# Patient Record
Sex: Female | Born: 1960 | Race: Black or African American | Hispanic: No | Marital: Single | Smoking: Current some day smoker
Health system: Southern US, Community
[De-identification: ages and names within clinical notes are randomized; demographics above are authoritative.]

## PROBLEM LIST (undated history)

## (undated) DIAGNOSIS — N186 End stage renal disease: Secondary | ICD-10-CM

## (undated) DIAGNOSIS — F329 Major depressive disorder, single episode, unspecified: Secondary | ICD-10-CM

## (undated) DIAGNOSIS — Z992 Dependence on renal dialysis: Secondary | ICD-10-CM

## (undated) DIAGNOSIS — I10 Essential (primary) hypertension: Secondary | ICD-10-CM

## (undated) DIAGNOSIS — M549 Dorsalgia, unspecified: Secondary | ICD-10-CM

## (undated) DIAGNOSIS — K732 Chronic active hepatitis, not elsewhere classified: Secondary | ICD-10-CM

## (undated) DIAGNOSIS — G8929 Other chronic pain: Secondary | ICD-10-CM

## (undated) DIAGNOSIS — K219 Gastro-esophageal reflux disease without esophagitis: Secondary | ICD-10-CM

## (undated) DIAGNOSIS — G20A1 Parkinson's disease without dyskinesia, without mention of fluctuations: Secondary | ICD-10-CM

## (undated) DIAGNOSIS — R002 Palpitations: Secondary | ICD-10-CM

## (undated) DIAGNOSIS — F32A Depression, unspecified: Secondary | ICD-10-CM

## (undated) DIAGNOSIS — R7989 Other specified abnormal findings of blood chemistry: Secondary | ICD-10-CM

## (undated) DIAGNOSIS — N179 Acute kidney failure, unspecified: Secondary | ICD-10-CM

## (undated) DIAGNOSIS — R945 Abnormal results of liver function studies: Secondary | ICD-10-CM

## (undated) DIAGNOSIS — F419 Anxiety disorder, unspecified: Secondary | ICD-10-CM

## (undated) DIAGNOSIS — G20 Parkinson's disease: Secondary | ICD-10-CM

## (undated) DIAGNOSIS — R011 Cardiac murmur, unspecified: Secondary | ICD-10-CM

## (undated) HISTORY — PX: TUBAL LIGATION: SHX77

## (undated) HISTORY — PX: TONSILLECTOMY: SUR1361

## (undated) HISTORY — PX: CARPAL TUNNEL RELEASE: SHX101

## (undated) HISTORY — DX: Acute kidney failure, unspecified: N17.9

## (undated) HISTORY — DX: Chronic active hepatitis, not elsewhere classified: K73.2

## (undated) HISTORY — DX: Other specified abnormal findings of blood chemistry: R79.89

## (undated) HISTORY — DX: Abnormal results of liver function studies: R94.5

---

## 1999-03-09 ENCOUNTER — Other Ambulatory Visit: Admission: RE | Admit: 1999-03-09 | Discharge: 1999-03-09 | Payer: Self-pay | Admitting: Obstetrics

## 1999-05-10 ENCOUNTER — Emergency Department (HOSPITAL_COMMUNITY): Admission: EM | Admit: 1999-05-10 | Discharge: 1999-05-10 | Payer: Self-pay | Admitting: Emergency Medicine

## 1999-06-29 ENCOUNTER — Emergency Department (HOSPITAL_COMMUNITY): Admission: EM | Admit: 1999-06-29 | Discharge: 1999-06-29 | Payer: Self-pay | Admitting: Emergency Medicine

## 1999-08-03 ENCOUNTER — Emergency Department (HOSPITAL_COMMUNITY): Admission: EM | Admit: 1999-08-03 | Discharge: 1999-08-03 | Payer: Self-pay | Admitting: Emergency Medicine

## 2002-03-30 ENCOUNTER — Inpatient Hospital Stay (HOSPITAL_COMMUNITY): Admission: AD | Admit: 2002-03-30 | Discharge: 2002-03-30 | Payer: Self-pay | Admitting: Obstetrics

## 2002-04-01 ENCOUNTER — Encounter: Payer: Self-pay | Admitting: Obstetrics

## 2002-04-01 ENCOUNTER — Ambulatory Visit (HOSPITAL_COMMUNITY): Admission: RE | Admit: 2002-04-01 | Discharge: 2002-04-01 | Payer: Self-pay | Admitting: Obstetrics

## 2002-12-18 ENCOUNTER — Emergency Department (HOSPITAL_COMMUNITY): Admission: EM | Admit: 2002-12-18 | Discharge: 2002-12-18 | Payer: Self-pay | Admitting: Emergency Medicine

## 2003-01-24 ENCOUNTER — Ambulatory Visit (HOSPITAL_BASED_OUTPATIENT_CLINIC_OR_DEPARTMENT_OTHER): Admission: RE | Admit: 2003-01-24 | Discharge: 2003-01-24 | Payer: Self-pay | Admitting: Cardiology

## 2005-01-10 ENCOUNTER — Emergency Department (HOSPITAL_COMMUNITY): Admission: EM | Admit: 2005-01-10 | Discharge: 2005-01-10 | Payer: Self-pay | Admitting: Emergency Medicine

## 2005-04-04 ENCOUNTER — Ambulatory Visit (HOSPITAL_COMMUNITY): Admission: RE | Admit: 2005-04-04 | Discharge: 2005-04-04 | Payer: Self-pay | Admitting: Obstetrics

## 2005-07-20 ENCOUNTER — Ambulatory Visit (HOSPITAL_BASED_OUTPATIENT_CLINIC_OR_DEPARTMENT_OTHER): Admission: RE | Admit: 2005-07-20 | Discharge: 2005-07-20 | Payer: Self-pay | Admitting: Otolaryngology

## 2005-07-20 ENCOUNTER — Encounter (INDEPENDENT_AMBULATORY_CARE_PROVIDER_SITE_OTHER): Payer: Self-pay | Admitting: Specialist

## 2005-07-20 ENCOUNTER — Ambulatory Visit (HOSPITAL_COMMUNITY): Admission: RE | Admit: 2005-07-20 | Discharge: 2005-07-20 | Payer: Self-pay | Admitting: Otolaryngology

## 2006-10-31 ENCOUNTER — Encounter: Admission: RE | Admit: 2006-10-31 | Discharge: 2006-10-31 | Payer: Self-pay | Admitting: Internal Medicine

## 2007-07-31 ENCOUNTER — Encounter: Admission: RE | Admit: 2007-07-31 | Discharge: 2007-08-20 | Payer: Self-pay | Admitting: Orthopedic Surgery

## 2007-10-15 ENCOUNTER — Ambulatory Visit (HOSPITAL_BASED_OUTPATIENT_CLINIC_OR_DEPARTMENT_OTHER): Admission: RE | Admit: 2007-10-15 | Discharge: 2007-10-15 | Payer: Self-pay | Admitting: Orthopedic Surgery

## 2008-02-23 ENCOUNTER — Emergency Department (HOSPITAL_COMMUNITY): Admission: EM | Admit: 2008-02-23 | Discharge: 2008-02-24 | Payer: Self-pay | Admitting: Emergency Medicine

## 2008-07-01 ENCOUNTER — Encounter: Admission: RE | Admit: 2008-07-01 | Discharge: 2008-07-01 | Payer: Self-pay | Admitting: Orthopedic Surgery

## 2009-01-04 ENCOUNTER — Emergency Department (HOSPITAL_COMMUNITY): Admission: EM | Admit: 2009-01-04 | Discharge: 2009-01-04 | Payer: Self-pay | Admitting: Emergency Medicine

## 2009-01-23 ENCOUNTER — Emergency Department (HOSPITAL_COMMUNITY): Admission: EM | Admit: 2009-01-23 | Discharge: 2009-01-23 | Payer: Self-pay | Admitting: Emergency Medicine

## 2009-02-25 ENCOUNTER — Emergency Department (HOSPITAL_COMMUNITY): Admission: EM | Admit: 2009-02-25 | Discharge: 2009-02-25 | Payer: Self-pay | Admitting: Emergency Medicine

## 2009-04-10 ENCOUNTER — Emergency Department (HOSPITAL_COMMUNITY): Admission: EM | Admit: 2009-04-10 | Discharge: 2009-04-10 | Payer: Self-pay | Admitting: Emergency Medicine

## 2009-07-22 ENCOUNTER — Emergency Department (HOSPITAL_COMMUNITY): Admission: EM | Admit: 2009-07-22 | Discharge: 2009-07-22 | Payer: Self-pay | Admitting: Emergency Medicine

## 2009-10-26 ENCOUNTER — Emergency Department (HOSPITAL_COMMUNITY): Admission: EM | Admit: 2009-10-26 | Discharge: 2009-10-27 | Payer: Self-pay | Admitting: Emergency Medicine

## 2010-09-09 ENCOUNTER — Encounter: Payer: Self-pay | Admitting: Internal Medicine

## 2010-09-10 ENCOUNTER — Encounter: Payer: Self-pay | Admitting: Obstetrics

## 2010-09-10 ENCOUNTER — Encounter: Payer: Self-pay | Admitting: Internal Medicine

## 2010-09-10 ENCOUNTER — Encounter: Payer: Self-pay | Admitting: Orthopedic Surgery

## 2010-09-11 ENCOUNTER — Encounter: Payer: Self-pay | Admitting: Internal Medicine

## 2010-09-26 ENCOUNTER — Emergency Department (HOSPITAL_COMMUNITY)
Admission: EM | Admit: 2010-09-26 | Discharge: 2010-09-26 | Disposition: A | Discharge status: Home / Self Care | Payer: Medicaid Other | Attending: Emergency Medicine | Admitting: Emergency Medicine

## 2010-09-26 DIAGNOSIS — I10 Essential (primary) hypertension: Secondary | ICD-10-CM | POA: Insufficient documentation

## 2010-09-26 DIAGNOSIS — E119 Type 2 diabetes mellitus without complications: Secondary | ICD-10-CM | POA: Insufficient documentation

## 2010-09-26 DIAGNOSIS — K089 Disorder of teeth and supporting structures, unspecified: Secondary | ICD-10-CM | POA: Insufficient documentation

## 2010-09-26 DIAGNOSIS — F3130 Bipolar disorder, current episode depressed, mild or moderate severity, unspecified: Secondary | ICD-10-CM | POA: Insufficient documentation

## 2010-09-26 DIAGNOSIS — Z79899 Other long term (current) drug therapy: Secondary | ICD-10-CM | POA: Insufficient documentation

## 2010-11-12 LAB — POCT I-STAT, CHEM 8
BUN: 6 mg/dL (ref 6–23)
Calcium, Ion: 1.13 mmol/L (ref 1.12–1.32)
Glucose, Bld: 133 mg/dL — ABNORMAL HIGH (ref 70–99)
TCO2: 24 mmol/L (ref 0–100)

## 2010-11-12 LAB — RAPID URINE DRUG SCREEN, HOSP PERFORMED
Amphetamines: NOT DETECTED
Cocaine: POSITIVE — AB
Opiates: NOT DETECTED
Tetrahydrocannabinol: NOT DETECTED

## 2010-11-12 LAB — ETHANOL: Alcohol, Ethyl (B): 202 mg/dL — ABNORMAL HIGH (ref 0–10)

## 2010-11-25 LAB — CBC
Hemoglobin: 12.3 g/dL (ref 12.0–15.0)
MCV: 85.4 fL (ref 78.0–100.0)
Platelets: 266 10*3/uL (ref 150–400)
RBC: 4.43 MIL/uL (ref 3.87–5.11)
RDW: 18.4 % — ABNORMAL HIGH (ref 11.5–15.5)

## 2010-11-25 LAB — URINALYSIS, ROUTINE W REFLEX MICROSCOPIC
Bilirubin Urine: NEGATIVE
Glucose, UA: >1000 mg/dL — AB
Ketones, ur: NEGATIVE mg/dL
Nitrite: NEGATIVE
Urobilinogen, UA: 1 mg/dL (ref 0.0–1.0)

## 2010-11-25 LAB — DIFFERENTIAL
Basophils Absolute: 0.2 10*3/uL — ABNORMAL HIGH (ref 0.0–0.1)
Basophils Relative: 2 % — ABNORMAL HIGH (ref 0–1)
Eosinophils Absolute: 0.2 10*3/uL (ref 0.0–0.7)
Lymphocytes Relative: 23 % (ref 12–46)
Lymphs Abs: 1.8 10*3/uL (ref 0.7–4.0)
Monocytes Absolute: 0.6 10*3/uL (ref 0.1–1.0)
Neutro Abs: 5.2 10*3/uL (ref 1.7–7.7)

## 2010-11-25 LAB — GLUCOSE, CAPILLARY: Glucose-Capillary: 447 mg/dL — ABNORMAL HIGH (ref 70–99)

## 2010-11-25 LAB — COMPREHENSIVE METABOLIC PANEL
ALT: 88 U/L — ABNORMAL HIGH (ref 0–35)
AST: 451 U/L — ABNORMAL HIGH (ref 0–37)
Albumin: 3.3 g/dL — ABNORMAL LOW (ref 3.5–5.2)
Alkaline Phosphatase: 234 U/L — ABNORMAL HIGH (ref 39–117)
CO2: 23 mEq/L (ref 19–32)
Calcium: 9.4 mg/dL (ref 8.4–10.5)
Chloride: 100 mEq/L (ref 96–112)
GFR calc Af Amer: >60 mL/min (ref >60)
GFR calc non Af Amer: >60 mL/min (ref >60)
Glucose, Bld: 358 mg/dL — ABNORMAL HIGH (ref 70–99)
Total Protein: 8.5 g/dL — ABNORMAL HIGH (ref 6.0–8.3)

## 2010-11-25 LAB — URINE MICROSCOPIC-ADD ON

## 2010-11-26 LAB — URINE CULTURE: Colony Count: >=100000

## 2010-11-26 LAB — POCT I-STAT, CHEM 8
BUN: 6 mg/dL (ref 6–23)
Creatinine, Ser: 0.8 mg/dL (ref 0.4–1.2)
Hemoglobin: 12.9 g/dL (ref 12.0–15.0)
Potassium: 3.5 mEq/L (ref 3.5–5.1)
Sodium: 137 mEq/L (ref 135–145)
TCO2: 27 mmol/L (ref 0–100)

## 2010-11-26 LAB — URINALYSIS, ROUTINE W REFLEX MICROSCOPIC
Glucose, UA: NEGATIVE mg/dL
Ketones, ur: NEGATIVE mg/dL
Nitrite: POSITIVE — AB
Specific Gravity, Urine: 1.012 (ref 1.005–1.030)
pH: 6 (ref 5.0–8.0)

## 2010-11-26 LAB — URINE MICROSCOPIC-ADD ON

## 2010-11-26 LAB — PREGNANCY, URINE: Preg Test, Ur: NEGATIVE

## 2010-11-26 LAB — RAPID URINE DRUG SCREEN, HOSP PERFORMED
Cocaine: NOT DETECTED
Tetrahydrocannabinol: NOT DETECTED

## 2010-11-28 LAB — GLUCOSE, CAPILLARY: Glucose-Capillary: 73 mg/dL (ref 70–99)

## 2011-01-02 NOTE — Op Note (Signed)
NAMESOPHEAP, RODIO NO.:  [id number]   MEDICAL RECORD NO.:  [id number]          PATIENT TYPE:  AMB   LOCATION:  [place]                          FACILITY:  [facility]   PHYSICIAN:  Sheral Apley. 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   POSTOPERATIVE DIAGNOSIS:  Chronic right thumb stenosing tenosynovitis  and right cubital tunnel syndrome.   PROCEDURE:  Right cubital tunnel release and right thumb A1 pulley  release.   SURGEON:  Sheral Apley. Burney Gauze, M.D.   ASSISTANT:  None.   ANESTHESIA:  General.   TOURNIQUET TIME:  31 minutes.   COMPLICATIONS:  None.   DRAINS:  None.   OPERATIVE REPORT:  The patient was taken to the operating suite. After  the induction of adequate general anesthesia, the right upper extremity  was prepped and draped in a sterile fashion.  Esmarch was used to  exsanguinate the limb and the tourniquet inflated to 250 mmHg.  At this  point in time, an incision was made on the medial aspect of the right  elbow centered between the olecranon process and medial epicondyle.  The  skin was incised.  Branches of the medial antebrachial cutaneous nerve  were identified and retracted. The cubital tunnel was identified,  unroofed.  The ulnar nerve was decompressed down to the level of the  flexor carpi ulnaris muscle.  The fascia overlying this was split and  the nerve was traced deep into the muscle bellies and all pressure  points were relieved. Proximally, it was released under a skin bridge  for 6-8 cm including release of the inner muscular septum. All pressure  points were relieved.  The wound was then thoroughly irrigated and  loosely closed in layers of 2-0 Vicryl and 4-0 Vicryl Rapide  subcuticular stitch.  A second incision was made over  the MP flexion  crease right thumb.  The skin was incised.  Neurovascular bundle was  identified and retracted.  The A1 pulley was split.  The FPL tendon was  lysed of all adhesions.  It was irrigated and loosely closed with 4-0  Vicryl Rapide.  Steri-Strips, 4x4s, fluffs, and compressive dressings  were applied at both the elbow and the thumb.  The patient tolerated  both procedures well and went to the recovery room in stable fashion.     Sheral Apley Burney Gauze, M.D.  Electronically Signed    MAW/MEDQ  D:  10/15/2007  T:  10/15/2007  Job:  [id number]

## 2011-01-05 NOTE — Op Note (Signed)
[name], GUHL NO.:  [id number]   MEDICAL RECORD NO.:  [id number]          PATIENT TYPE:  AMB   LOCATION:  [place]                          FACILITY:  [facility]   PHYSICIAN:  Leonides Sake. 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   OPERATION:  Microlaryngoscopy with excision of right anterior vocal cord  lesion.   SURGEON:  Leonides Sake. Lucia Gaskins, M.D.   ANESTHESIA:  General endotracheal.   COMPLICATIONS:  None.   BRIEF CLINICAL NOTE:  Carol [name] is a 50 year old female who has had  hoarseness now for several months.  She has had a previous history of  microlaryngoscopy and laser excision of vocal cord lesion by Dr. Enid Skeens  in 1992.  She does smoke one-half to one pack a day.  Also has a history of  acid reflux, for which she takes Aciphex.  On examination in the office on  indirect laryngoscopy, she has a right anterior true vocal cord lesion,  questionable polyp or papilloma.  She is taken to the operating room at this  time for direct laryngoscopy and excision of right vocal cord lesion.   DESCRIPTION OF PROCEDURE:  After adequate endotracheal anesthesia, direct  laryngoscopy was performed.  The base of tongue and epiglottis were normal.  Both piriform sinuses were clear.  AE folds were normal.  The vocal cords  were then evaluated and the laryngoscope was suspended.  The patient had a  large exudative polypoid or papillomatous lesion off the anterior right true  vocal cord.  Had a reactive small nodule on the left anterior true vocal  cord.  The large papillomatous lesion was excised at its base from the vocal  cord and sent to pathology.  Hemostasis was obtained with cotton  pledget  soaked in epinephrine.  This completed the procedure.  Eleonore was awoken  from anesthesia and transferred to the recovery room postop doing well.   DISPOSITION:  [name] is discharged home later this morning on Tylenol and  Tylenol No. 3 p.r.n. pain.  Will continue her regular medications.  Also  will give her a prescription for a Nicoderm patch to use to help her to try  to stop smoking.           ______________________________  Leonides Sake. Lucia Gaskins, M.D.     CEN/MEDQ  D:  07/20/2005  T:  07/20/2005  Job:  [id number]   cc:   Nolene Ebbs, M.D.  Fax: [phone]

## 2011-05-11 LAB — BASIC METABOLIC PANEL
BUN: 8
CO2: 24
Calcium: 9
Chloride: 107
Creatinine, Ser: 0.73
GFR calc Af Amer: >60
GFR calc non Af Amer: >60
Glucose, Bld: 154 — ABNORMAL HIGH
Potassium: 4.2
Sodium: 136

## 2011-05-11 LAB — POCT HEMOGLOBIN-HEMACUE: Hemoglobin: 12.1

## 2011-05-17 LAB — BASIC METABOLIC PANEL
BUN: 5 — ABNORMAL LOW
CO2: 23
Chloride: 104
Glucose, Bld: 92
Potassium: 3.2 — ABNORMAL LOW

## 2011-05-17 LAB — DIFFERENTIAL
Lymphocytes Relative: 45
Lymphs Abs: 2.7
Monocytes Absolute: 0.9
Monocytes Relative: 15 — ABNORMAL HIGH
Neutro Abs: 2.1

## 2011-05-17 LAB — CBC
HCT: 39.4
Hemoglobin: 13.1
RBC: 4.42

## 2011-05-17 LAB — CK TOTAL AND CKMB (NOT AT ARMC): Total CK: 123

## 2011-05-17 LAB — TROPONIN I: Troponin I: <0.01

## 2011-05-27 ENCOUNTER — Emergency Department (HOSPITAL_COMMUNITY)
Admission: EM | Admit: 2011-05-27 | Discharge: 2011-05-27 | Disposition: A | Discharge status: Home / Self Care | Payer: Medicare Other | Attending: Emergency Medicine | Admitting: Emergency Medicine

## 2011-05-27 ENCOUNTER — Emergency Department (HOSPITAL_COMMUNITY): Payer: Medicare Other

## 2011-05-27 DIAGNOSIS — W1809XA Striking against other object with subsequent fall, initial encounter: Secondary | ICD-10-CM | POA: Insufficient documentation

## 2011-05-27 DIAGNOSIS — S2249XA Multiple fractures of ribs, unspecified side, initial encounter for closed fracture: Secondary | ICD-10-CM | POA: Insufficient documentation

## 2011-05-27 DIAGNOSIS — Z794 Long term (current) use of insulin: Secondary | ICD-10-CM | POA: Insufficient documentation

## 2011-05-27 DIAGNOSIS — E119 Type 2 diabetes mellitus without complications: Secondary | ICD-10-CM | POA: Insufficient documentation

## 2011-05-27 DIAGNOSIS — F319 Bipolar disorder, unspecified: Secondary | ICD-10-CM | POA: Insufficient documentation

## 2011-05-27 DIAGNOSIS — R071 Chest pain on breathing: Secondary | ICD-10-CM | POA: Insufficient documentation

## 2011-05-27 DIAGNOSIS — I10 Essential (primary) hypertension: Secondary | ICD-10-CM | POA: Insufficient documentation

## 2011-06-17 ENCOUNTER — Emergency Department (HOSPITAL_COMMUNITY)
Admission: EM | Admit: 2011-06-17 | Discharge: 2011-06-17 | Disposition: A | Discharge status: Home / Self Care | Payer: Medicare Other | Attending: Emergency Medicine | Admitting: Emergency Medicine

## 2011-06-17 DIAGNOSIS — R21 Rash and other nonspecific skin eruption: Secondary | ICD-10-CM | POA: Insufficient documentation

## 2011-06-17 DIAGNOSIS — I10 Essential (primary) hypertension: Secondary | ICD-10-CM | POA: Insufficient documentation

## 2011-06-17 DIAGNOSIS — E119 Type 2 diabetes mellitus without complications: Secondary | ICD-10-CM | POA: Insufficient documentation

## 2011-07-03 ENCOUNTER — Other Ambulatory Visit: Payer: Self-pay

## 2011-07-03 ENCOUNTER — Encounter: Payer: Self-pay | Admitting: Cardiology

## 2011-07-03 ENCOUNTER — Emergency Department (HOSPITAL_COMMUNITY)
Admission: EM | Admit: 2011-07-03 | Discharge: 2011-07-03 | Disposition: A | Discharge status: Home / Self Care | Payer: Medicare Other | Attending: Emergency Medicine | Admitting: Emergency Medicine

## 2011-07-03 ENCOUNTER — Emergency Department (HOSPITAL_COMMUNITY): Payer: Medicare Other

## 2011-07-03 DIAGNOSIS — E119 Type 2 diabetes mellitus without complications: Secondary | ICD-10-CM | POA: Insufficient documentation

## 2011-07-03 DIAGNOSIS — Z794 Long term (current) use of insulin: Secondary | ICD-10-CM | POA: Insufficient documentation

## 2011-07-03 DIAGNOSIS — N390 Urinary tract infection, site not specified: Secondary | ICD-10-CM | POA: Insufficient documentation

## 2011-07-03 DIAGNOSIS — I10 Essential (primary) hypertension: Secondary | ICD-10-CM | POA: Insufficient documentation

## 2011-07-03 DIAGNOSIS — R002 Palpitations: Secondary | ICD-10-CM | POA: Insufficient documentation

## 2011-07-03 HISTORY — DX: Other chronic pain: G89.29

## 2011-07-03 HISTORY — DX: Depression, unspecified: F32.A

## 2011-07-03 HISTORY — DX: Dorsalgia, unspecified: M54.9

## 2011-07-03 HISTORY — DX: Major depressive disorder, single episode, unspecified: F32.9

## 2011-07-03 HISTORY — DX: Essential (primary) hypertension: I10

## 2011-07-03 HISTORY — DX: Palpitations: R00.2

## 2011-07-03 LAB — URINALYSIS, ROUTINE W REFLEX MICROSCOPIC
Glucose, UA: NEGATIVE mg/dL
Specific Gravity, Urine: 1.028 (ref 1.005–1.030)
Urobilinogen, UA: 1 mg/dL (ref 0.0–1.0)

## 2011-07-03 LAB — CBC
MCH: 30.9 pg (ref 26.0–34.0)
MCHC: 35.3 g/dL (ref 30.0–36.0)
Platelets: 223 10*3/uL (ref 150–400)
RBC: 4.66 MIL/uL (ref 3.87–5.11)

## 2011-07-03 LAB — DIFFERENTIAL
Basophils Relative: 0 % (ref 0–1)
Eosinophils Absolute: 0.1 10*3/uL (ref 0.0–0.7)
Neutrophils Relative %: 45 % (ref 43–77)

## 2011-07-03 LAB — URINE MICROSCOPIC-ADD ON

## 2011-07-03 LAB — BASIC METABOLIC PANEL
BUN: 14 mg/dL (ref 6–23)
Calcium: 9.9 mg/dL (ref 8.4–10.5)
GFR calc non Af Amer: 74 mL/min — ABNORMAL LOW (ref >90)
Glucose, Bld: 147 mg/dL — ABNORMAL HIGH (ref 70–99)
Sodium: 139 mEq/L (ref 135–145)

## 2011-07-03 LAB — PREGNANCY, URINE: Preg Test, Ur: NEGATIVE

## 2011-07-03 LAB — D-DIMER, QUANTITATIVE: D-Dimer, Quant: 0.32 ug/mL-FEU (ref 0.00–0.48)

## 2011-07-03 LAB — POCT I-STAT TROPONIN I: Troponin i, poc: 0 ng/mL (ref 0.00–0.08)

## 2011-07-03 MED ORDER — CEPHALEXIN 250 MG PO CAPS
500.0000 mg | ORAL_CAPSULE | Freq: Once | ORAL | Status: AC
  Administered 2011-07-03: 500 mg via ORAL
  Filled 2011-07-03: qty 2

## 2011-07-03 MED ORDER — CEPHALEXIN 500 MG PO CAPS: 500.0000 mg | ORAL_CAPSULE | Freq: Four times a day (QID) | ORAL | Status: AC

## 2011-07-03 NOTE — ED Provider Notes (Signed)
History     CSN: [id number] Arrival date & time: 07/03/2011  7:13 PM    Chief Complaint  Patient presents with  . Palpitations    HPI Pt was seen at [facility].  Per pt, c/o gradual onset and improvement in persistent palpitations that began PTA.  Pt states she had just finished taking her usual evening meds and was sitting watching TV when her symptoms began.  States she has had similar symptoms previously and "that's why I take I medicines."  Saw her PMD last week and told she was "ok."  Denies any change in her usual symptoms, no new symptoms, no new meds/changes, no CP/SOB, no cough, no fevers, no abd pain, no back pain, no recent long travel, no estrogen products, no calf/LE pain or unilateral swelling.   Past Medical History  Diagnosis Date  . Hypertension   . Diabetes mellitus   . Depression   . Chronic back pain   . Palpitations     Past Surgical History  Procedure Date  . Carpal tunnel release      History  Substance Use Topics  . Smoking status: Current Everyday Smoker  . Smokeless tobacco: Not on file  . Alcohol Use: Yes     occasional    Review of Systems ROS: Statement: All systems negative except as marked or noted in the HPI; Constitutional: Negative for fever and chills. ; ; Eyes: Negative for eye pain, redness and discharge. ; ; ENMT: Negative for ear pain, hoarseness, nasal congestion, sinus pressure and sore throat. ; ; Cardiovascular: +palpitations. Negative for chest pain, diaphoresis, dyspnea and peripheral edema. ; ; Respiratory: Negative for cough, wheezing and stridor. ; ; Gastrointestinal: Negative for nausea, vomiting, diarrhea and abdominal pain, blood in stool, hematemesis, jaundice and rectal bleeding. . ; ; Genitourinary: Negative for dysuria, flank pain and hematuria. ; ; Musculoskeletal: Negative for back pain and neck pain. Negative for swelling and trauma.; ; Skin: Negative for pruritus, rash, abrasions, blisters, bruising and skin lesion.; ; Neuro:  Negative for headache, lightheadedness and neck stiffness. Negative for weakness, altered level of consciousness , altered mental status, extremity weakness, paresthesias, involuntary movement, seizure and syncope.  Psych:  No SI, no SA, no HI, no hallucinations.  Allergies  Review of patient's allergies indicates no known allergies.  Home Medications   Current Outpatient Rx  Name Route Sig Dispense Refill  . CARVEDILOL 3.125 MG PO TABS Oral Take 3.125 mg by mouth daily.      Marland Kitchen DIPHENHYDRAMINE HCL (SLEEP) 25 MG PO TABS Oral Take 25 mg by mouth at bedtime as needed. For sleep     . GLIMEPIRIDE 4 MG PO TABS Oral Take 4 mg by mouth 2 (two) times daily.      . INSULIN ASPART 100 UNIT/ML [place] SOLN Subcutaneous Inject 2-15 Units into the skin 2 (two) times daily. Pt uses home sliding scale (>200 = 4 units)     . INSULIN GLARGINE 100 UNIT/ML [place] SOLN Subcutaneous Inject 30 Units into the skin every morning.      Marland Kitchen METFORMIN HCL 500 MG PO TABS Oral Take 500 mg by mouth 2 (two) times daily with a meal.      . OLMESARTAN MEDOXOMIL-HCTZ 40-12.5 MG PO TABS Oral Take 1 tablet by mouth daily.      . QUETIAPINE FUMARATE 150 MG PO TB24 Oral Take 150 mg by mouth at bedtime.      . CEPHALEXIN 500 MG PO CAPS Oral Take 1 capsule (500  mg total) by mouth 4 (four) times daily. 40 capsule 0    BP 107/65  Pulse 82  Resp 19  SpO2 100%  Physical Exam 2000: Physical examination:  Nursing notes reviewed; Vital signs and O2 SAT reviewed;  Constitutional: Well developed, Well nourished, Well hydrated, In no acute distress; Head:  Normocephalic, atraumatic; Eyes: EOMI, PERRL, No scleral icterus; ENMT: Mouth and pharynx normal, Mucous membranes moist; Neck: Supple, Full range of motion, No lymphadenopathy; Cardiovascular: Mild tachycardia, HR 100 during my exam, No murmur, rub, or gallop; Respiratory: Breath sounds clear & equal bilaterally, No rales, rhonchi, wheezes, or rub, Normal respiratory effort/excursion; Chest:  Nontender, Movement normal; Abdomen: Soft, Nontender, Nondistended, Normal bowel sounds; Extremities: Pulses normal, No tenderness, No edema, No calf edema or asymmetry.; Neuro: AA&Ox3, Major CN grossly intact.  No gross focal motor or sensory deficits in extremities.; Skin: Color normal, Warm, Dry, Psych:  Affect flat.    ED Course  Procedures   MDM  MDM Reviewed: nursing note, vitals and previous chart Reviewed previous: ECG Interpretation: ECG, x-ray and labs    Date: 07/03/2011  Rate: 115  Rhythm: sinus tachycardia  QRS Axis: normal  Intervals: QT prolonged  ST/T Wave abnormalities: normal  Conduction Disutrbances:none  Narrative Interpretation:   Old EKG Reviewed: unchanged, no significant changes from previous EKG dated 02/23/2008.   Results for orders placed during the hospital encounter of [id number]  BASIC METABOLIC PANEL      Component Value Range   Sodium 139  135 - 145 (mEq/L)   Potassium 3.6  3.5 - 5.1 (mEq/L)   Chloride 100  96 - 112 (mEq/L)   CO2 25  19 - 32 (mEq/L)   Glucose, Bld 147 (*) 70 - 99 (mg/dL)   BUN 14  6 - 23 (mg/dL)   Creatinine, Ser 0.90  0.50 - 1.10 (mg/dL)   Calcium 9.9  8.4 - 10.5 (mg/dL)   GFR calc non Af Amer 74 (*) >90 (mL/min)   GFR calc Af Amer 86 (*) >90 (mL/min)  CBC      Component Value Range   WBC 6.6  4.0 - 10.5 (K/uL)   RBC 4.66  3.87 - 5.11 (MIL/uL)   Hemoglobin 14.4  12.0 - 15.0 (g/dL)   HCT 40.8  36.0 - 46.0 (%)   MCV 87.6  78.0 - 100.0 (fL)   MCH 30.9  26.0 - 34.0 (pg)   MCHC 35.3  30.0 - 36.0 (g/dL)   RDW 13.3  11.5 - 15.5 (%)   Platelets 223  150 - 400 (K/uL)  DIFFERENTIAL      Component Value Range   Neutrophils Relative 45  43 - 77 (%)   Neutro Abs 3.0  1.7 - 7.7 (K/uL)   Lymphocytes Relative 41  12 - 46 (%)   Lymphs Abs 2.7  0.7 - 4.0 (K/uL)   Monocytes Relative 12  3 - 12 (%)   Monocytes Absolute 0.8  0.1 - 1.0 (K/uL)   Eosinophils Relative 2  0 - 5 (%)   Eosinophils Absolute 0.1  0.0 - 0.7 (K/uL)   Basophils  Relative 0  0 - 1 (%)   Basophils Absolute 0.0  0.0 - 0.1 (K/uL)  D-DIMER, QUANTITATIVE      Component Value Range   D-Dimer, Quant 0.32  0.00 - 0.48 (ug/mL-FEU)  URINALYSIS, ROUTINE W REFLEX MICROSCOPIC      Component Value Range   Color, Urine AMBER (*) YELLOW    Appearance CLOUDY (*) CLEAR  Specific Gravity, Urine 1.028  1.005 - 1.030    pH 6.0  5.0 - 8.0    Glucose, UA NEGATIVE  NEGATIVE (mg/dL)   Hgb urine dipstick LARGE (*) NEGATIVE    Bilirubin Urine SMALL (*) NEGATIVE    Ketones, ur 15 (*) NEGATIVE (mg/dL)   Protein, ur 30 (*) NEGATIVE (mg/dL)   Urobilinogen, UA 1.0  0.0 - 1.0 (mg/dL)   Nitrite NEGATIVE  NEGATIVE    Leukocytes, UA LARGE (*) NEGATIVE   PREGNANCY, URINE      Component Value Range   Preg Test, Ur NEGATIVE    POCT I-STAT TROPONIN I      Component Value Range   Troponin i, poc 0.00  0.00 - 0.08 (ng/mL)   Comment 3           URINE MICROSCOPIC-ADD ON      Component Value Range   Squamous Epithelial / LPF MANY (*) RARE    WBC, UA 21-50  <3 (WBC/hpf)   RBC / HPF 21-50  <3 (RBC/hpf)   Bacteria, UA FEW (*) RARE    Urine-Other MUCOUS PRESENT     Dg Chest 2 View  07/03/2011  *RADIOLOGY REPORT*  Clinical Data: 50 year old female with tachycardia and cough.  CHEST - 2 VIEW  Comparison: 05/27/2011  Findings: The cardiomediastinal silhouette is unremarkable. The lungs are clear. There is no evidence of focal airspace disease, pulmonary edema, pulmonary nodule/mass, pleural effusion, or pneumothorax. No acute bony abnormalities are identified.  IMPRESSION: No evidence of active cardiopulmonary disease.  Original Report Authenticated By: Lura Em, M.D.   11:35 AM:  +UTI, UC pending.  Will give 1st dose abx in ED.  VSS since arrival to ED, HR 80's.  Feels "better," has tol PO well, and tells multiple ED staff she wants to go home now.  Dx testing d/w pt and family.  Questions answered.  Verb understanding, agreeable to d/c home with outpt f/u.   Medications given  in ED:  cephALEXin (KEFLEX) capsule 500 mg (500 mg Oral Given 07/03/11 2348)    New Prescriptions   CEPHALEXIN (KEFLEX) 500 MG CAPSULE    Take 1 capsule (500 mg total) by mouth 4 (four) times daily.      [place], DO 07/04/11 1138

## 2011-07-03 NOTE — ED Notes (Signed)
Pt given snack and drink; updated of POC

## 2011-07-03 NOTE — ED Notes (Signed)
PT attempting to provide urine sample 

## 2011-07-03 NOTE — ED Notes (Signed)
Family at bedside. 

## 2011-07-03 NOTE — ED Notes (Signed)
Palpitations starting early evening; pt anxious; VSS.

## 2011-07-03 NOTE — ED Notes (Signed)
Patient is resting comfortably. 

## 2011-07-04 ENCOUNTER — Encounter (HOSPITAL_COMMUNITY): Payer: Self-pay | Admitting: Emergency Medicine

## 2011-07-05 LAB — URINE CULTURE

## 2011-07-08 NOTE — ED Notes (Signed)
Pt (+) URNC-> Group B Strep.  Given Rx for Keflex -> No sensitivities provided.  Chart reviewed by Verl Dicker PA "Culture showed Group B Strep, covered by Keflex"  Chart appended per protocol.

## 2011-09-22 ENCOUNTER — Emergency Department (HOSPITAL_COMMUNITY): Payer: Medicare Other

## 2011-09-22 ENCOUNTER — Emergency Department (HOSPITAL_COMMUNITY)
Admission: EM | Admit: 2011-09-22 | Discharge: 2011-09-22 | Disposition: A | Discharge status: Home / Self Care | Payer: Medicare Other | Attending: Emergency Medicine | Admitting: Emergency Medicine

## 2011-09-22 ENCOUNTER — Encounter (HOSPITAL_COMMUNITY): Payer: Self-pay | Admitting: *Deleted

## 2011-09-22 DIAGNOSIS — Y9229 Other specified public building as the place of occurrence of the external cause: Secondary | ICD-10-CM | POA: Insufficient documentation

## 2011-09-22 DIAGNOSIS — M7989 Other specified soft tissue disorders: Secondary | ICD-10-CM | POA: Insufficient documentation

## 2011-09-22 DIAGNOSIS — E119 Type 2 diabetes mellitus without complications: Secondary | ICD-10-CM | POA: Insufficient documentation

## 2011-09-22 DIAGNOSIS — S62309A Unspecified fracture of unspecified metacarpal bone, initial encounter for closed fracture: Secondary | ICD-10-CM | POA: Insufficient documentation

## 2011-09-22 DIAGNOSIS — W010XXA Fall on same level from slipping, tripping and stumbling without subsequent striking against object, initial encounter: Secondary | ICD-10-CM | POA: Insufficient documentation

## 2011-09-22 DIAGNOSIS — Z794 Long term (current) use of insulin: Secondary | ICD-10-CM | POA: Insufficient documentation

## 2011-09-22 DIAGNOSIS — I10 Essential (primary) hypertension: Secondary | ICD-10-CM | POA: Insufficient documentation

## 2011-09-22 DIAGNOSIS — IMO0002 Reserved for concepts with insufficient information to code with codable children: Secondary | ICD-10-CM | POA: Insufficient documentation

## 2011-09-22 LAB — POCT I-STAT, CHEM 8
BUN: 6 mg/dL (ref 6–23)
Calcium, Ion: 1.06 mmol/L — ABNORMAL LOW (ref 1.12–1.32)
Chloride: 109 mEq/L (ref 96–112)
Potassium: 3.1 mEq/L — ABNORMAL LOW (ref 3.5–5.1)

## 2011-09-22 LAB — GLUCOSE, CAPILLARY: Glucose-Capillary: 134 mg/dL — ABNORMAL HIGH (ref 70–99)

## 2011-09-22 MED ORDER — POTASSIUM CHLORIDE CRYS ER 20 MEQ PO TBCR
40.0000 meq | EXTENDED_RELEASE_TABLET | Freq: Once | ORAL | Status: AC
  Administered 2011-09-22: 40 meq via ORAL
  Filled 2011-09-22: qty 2

## 2011-09-22 MED ORDER — OXYCODONE-ACETAMINOPHEN 5-325 MG PO TABS: 2.0000 | ORAL_TABLET | ORAL | Status: AC | PRN

## 2011-09-22 MED ORDER — OXYCODONE-ACETAMINOPHEN 5-325 MG PO TABS
1.0000 | ORAL_TABLET | Freq: Once | ORAL | Status: AC
  Administered 2011-09-22: 1 via ORAL
  Filled 2011-09-22: qty 1

## 2011-09-22 NOTE — ED Notes (Signed)
Pt states she stumbled and fell yesterday, landing on her right hand.  Pt states the right wrist turned under when she fell with posterior right hand against the floor under her.  Pt has good pulses in RUE, but has non-pitting edema to right hand both posterior and anterior.  Pt denies pain radiating to wrist and no edema or deformity noted to wrist.  Pt hit her face when she fell as well and stated she she bruised her right upper lip, but denies pain there now.  Pt states she is uncertain as to whether or not she lost consciousness and states her son told her she fell when she complained of hand pain last night.

## 2011-09-22 NOTE — ED Provider Notes (Signed)
History     CSN: [id number]  Arrival date & time 09/22/11  1014   First MD Initiated Contact with Patient 09/22/11 1029      Chief Complaint  Patient presents with  . Hand Pain    right hand pain and swelling    (Consider location/radiation/quality/duration/timing/severity/associated sxs/prior treatment) HPI  51 year old female presenting to the ED complaining of right hand injury. Patient states she is a diabetic.  While walking at [employer] last night she felt lightheadedness and dizziness. Her son told her that she fell forward.  The patient then noticed pain to the right wrist and abrasion to her lips. Today she noticed increasing pain to right hand and wrist with swelling to. She denies significant pain to the face. She denies headache, lightheadedness, chest pain or shortness of breath, nausea, vomiting, diarrhea, abdominal pain, dysuria, rectal bleeding, numbness or weakness. Patient states she did not eat her meal last night prior to falling. She did not take her morning medication today.  Past Medical History  Diagnosis Date  . Hypertension   . Diabetes mellitus   . Depression   . Chronic back pain   . Palpitations     Past Surgical History  Procedure Date  . Carpal tunnel release     No family history on file.  History  Substance Use Topics  . Smoking status: Former Smoker -- 0.5 packs/day for 3 years    Types: Cigarettes    Quit date: 09/01/2011  . Smokeless tobacco: Not on file  . Alcohol Use: Yes     occasional    OB History    Grav Para Term Preterm Abortions TAB SAB Ect Mult Living                  Review of Systems  All other systems reviewed and are negative.    Allergies  Review of patient's allergies indicates no known allergies.  Home Medications   Current Outpatient Rx  Name Route Sig Dispense Refill  . CARVEDILOL 3.125 MG PO TABS Oral Take 3.125 mg by mouth daily.      Marland Kitchen DIPHENHYDRAMINE HCL (SLEEP) 25 MG PO TABS Oral Take 25 mg by  mouth at bedtime as needed. For sleep     . GLIMEPIRIDE 4 MG PO TABS Oral Take 4 mg by mouth 2 (two) times daily.      . INSULIN ASPART 100 UNIT/ML [place] SOLN Subcutaneous Inject 2-15 Units into the skin 2 (two) times daily. Pt uses home sliding scale (>200 = 4 units)     . INSULIN GLARGINE 100 UNIT/ML [place] SOLN Subcutaneous Inject 30 Units into the skin every morning.      Marland Kitchen METFORMIN HCL 500 MG PO TABS Oral Take 500 mg by mouth 2 (two) times daily with a meal.      . OLMESARTAN MEDOXOMIL-HCTZ 40-12.5 MG PO TABS Oral Take 1 tablet by mouth daily.      . QUETIAPINE FUMARATE ER 150 MG PO TB24 Oral Take 150 mg by mouth at bedtime.        BP 162/99  Pulse 70  Temp(Src) 98.5 F (36.9 C) (Oral)  Resp 18  SpO2 100%  Physical Exam  Nursing note and vitals reviewed. Constitutional: She appears well-developed and well-nourished.       Awake, alert, nontoxic appearance  HENT:  Head: Atraumatic.         No midface tenderness or point tenderness.  No septal hematoma, no hemotypanum  Eyes: Conjunctivae are normal.  Right eye exhibits no discharge. Left eye exhibits no discharge.  Neck: Normal range of motion. Neck supple.  Cardiovascular: Normal rate and regular rhythm.   Pulmonary/Chest: Effort normal. No respiratory distress. She exhibits no tenderness.  Abdominal: There is no tenderness. There is no rebound.  Musculoskeletal: She exhibits no tenderness.       Right wrist: She exhibits decreased range of motion, tenderness, swelling and effusion.       Right hand: She exhibits decreased range of motion, tenderness and swelling. She exhibits normal capillary refill and no deformity.       R hand: Moderate swelling noted to both palmar and dorsum of hand. Tenderness diffusely without no obvious deformity. Patient is able to perform thumb opposition. Radial pulses 2+, with intact brisk capillary refills.  Neurological:       Mental status and motor strength appears baseline for patient and situation    Skin: No rash noted.  Psychiatric: She has a normal mood and affect.    ED Course  Procedures (including critical care time)  Labs Reviewed - No data to display No results found.   No diagnosis found.    MDM  Obvious trauma to right hand as evidenced by moderate swelling. Her fall may likely related to hypoglycemia episode as she is a diabetic and did not eat her regular meal. I will obtain CBC, i-STAT, and orthostatic vital sign. I would obtain appropriate x-rays for further evaluation of the right hand and wrist. Pain medication given.  Patient's blood pressure is elevated, patient states she did not take her hypertension medication this morning.  11:58 AM X-ray of right hand shows evidence of a comminuted fracture of the heads of the fourth and fifth metacarpals. There are no wrist involvement.  Evaluation of patient's elbow and forearm was unremarkable. Low suspicion for Monteggia or Galeazzi  Fracture.  Ulna gutter splint will be applied. Patient will be given instructions to follow with hand specialist.  Potassium level is 3.1, I would give potassium supplementation in the ED. Her blood sugar is unremarkable. Orthostatic vital signs normal. Patient is able to ambulate without difficulty. I suggest patient to resume her regular medication. Patient voiced understanding and agreed with plan.  Domenic Moras, PA-C 09/22/11 1240

## 2011-09-27 NOTE — ED Provider Notes (Signed)
Medical screening examination/treatment/procedure(s) were conducted as a shared visit with non-physician practitioner(s) and myself.  I personally evaluated the patient during the encounter Pt w hand injury. Metacarpal fxs on xry. Skin intact. Hand nvi.   Mirna Mires, MD 09/27/11 [phone]

## 2012-02-04 ENCOUNTER — Encounter (HOSPITAL_COMMUNITY): Payer: Self-pay | Admitting: Emergency Medicine

## 2012-02-04 ENCOUNTER — Emergency Department (HOSPITAL_COMMUNITY): Payer: Medicare Other

## 2012-02-04 ENCOUNTER — Emergency Department (HOSPITAL_COMMUNITY)
Admission: EM | Admit: 2012-02-04 | Discharge: 2012-02-04 | Disposition: A | Discharge status: Home / Self Care | Payer: Medicare Other | Attending: Emergency Medicine | Admitting: Emergency Medicine

## 2012-02-04 DIAGNOSIS — Z794 Long term (current) use of insulin: Secondary | ICD-10-CM | POA: Insufficient documentation

## 2012-02-04 DIAGNOSIS — F329 Major depressive disorder, single episode, unspecified: Secondary | ICD-10-CM | POA: Insufficient documentation

## 2012-02-04 DIAGNOSIS — I10 Essential (primary) hypertension: Secondary | ICD-10-CM | POA: Insufficient documentation

## 2012-02-04 DIAGNOSIS — F3289 Other specified depressive episodes: Secondary | ICD-10-CM | POA: Insufficient documentation

## 2012-02-04 DIAGNOSIS — E119 Type 2 diabetes mellitus without complications: Secondary | ICD-10-CM | POA: Insufficient documentation

## 2012-02-04 DIAGNOSIS — G8929 Other chronic pain: Secondary | ICD-10-CM | POA: Insufficient documentation

## 2012-02-04 DIAGNOSIS — Z880 Allergy status to penicillin: Secondary | ICD-10-CM | POA: Insufficient documentation

## 2012-02-04 DIAGNOSIS — Z87891 Personal history of nicotine dependence: Secondary | ICD-10-CM | POA: Insufficient documentation

## 2012-02-04 DIAGNOSIS — S20219A Contusion of unspecified front wall of thorax, initial encounter: Secondary | ICD-10-CM | POA: Insufficient documentation

## 2012-02-04 DIAGNOSIS — W1809XA Striking against other object with subsequent fall, initial encounter: Secondary | ICD-10-CM | POA: Insufficient documentation

## 2012-02-04 LAB — BASIC METABOLIC PANEL
CO2: 27 mEq/L (ref 19–32)
Calcium: 9.7 mg/dL (ref 8.4–10.5)
Creatinine, Ser: 0.74 mg/dL (ref 0.50–1.10)
Glucose, Bld: 126 mg/dL — ABNORMAL HIGH (ref 70–99)
Sodium: 138 mEq/L (ref 135–145)

## 2012-02-04 LAB — CBC
HCT: 39.7 % (ref 36.0–46.0)
Hemoglobin: 13.8 g/dL (ref 12.0–15.0)
MCH: 31.3 pg (ref 26.0–34.0)
MCV: 90 fL (ref 78.0–100.0)
RBC: 4.41 MIL/uL (ref 3.87–5.11)

## 2012-02-04 LAB — POCT I-STAT TROPONIN I: Troponin i, poc: 0 ng/mL (ref 0.00–0.08)

## 2012-02-04 MED ORDER — HYDROMORPHONE HCL PF 1 MG/ML IJ SOLN
1.0000 mg | Freq: Once | INTRAMUSCULAR | Status: AC
  Administered 2012-02-04: 1 mg via INTRAVENOUS
  Filled 2012-02-04: qty 1

## 2012-02-04 MED ORDER — ASPIRIN 325 MG PO TABS: 325.0000 mg | ORAL_TABLET | ORAL | Status: DC

## 2012-02-04 MED ORDER — OXYCODONE-ACETAMINOPHEN 5-325 MG PO TABS: 1.0000 | ORAL_TABLET | ORAL | Status: AC | PRN

## 2012-02-04 MED ORDER — NITROGLYCERIN 0.4 MG SL SUBL: 0.4000 mg | SUBLINGUAL_TABLET | SUBLINGUAL | Status: DC | PRN

## 2012-02-04 NOTE — ED Notes (Signed)
Pt and companion report dancing last night when she fell on her right side "pretty hard." Pt reports waking up to take with the pain, which increases with deep breathing to the right sided. Pt stated, "I have broken ribs in the past and it feels kind of the same way."

## 2012-02-04 NOTE — Discharge Instructions (Signed)
Chest Contusion You have been checked for injuries to your chest. Your caregiver has not found injuries serious enough to require hospitalization. It is common to have bruises and sore muscles after an injury. These tend to feel worse the first 24 hours. You may gradually develop more stiffness and soreness over the next several hours to several days. This usually feels worse the first morning following your injury. After a few days, you will usually begin to improve. The amount of improvement depends on the amount of damage. Following the accident, if the pain in any area continues to increase or you develop new areas of pain, you should see your primary caregiver or return to the Emergency Department for re-evaluation. HOME CARE INSTRUCTIONS   Put ice on sore areas every 2 hours for 20 minutes while awake for the next 2 days.   Drink extra fluids. Do not drink alcohol.   Activity as tolerated. Lifting may make pain worse.   Only take over-the-counter or prescription medicines for pain, discomfort, or fever as directed by your caregiver. Do not use aspirin. This may increase bruising or increase bleeding.  SEEK IMMEDIATE MEDICAL CARE IF:   There is a worsening of any of the problems that brought you in for care.   Shortness of breath, dizziness or fainting develop.   You have chest pain, difficulty breathing, or develop pain going down the left arm or up into jaw.   You feel sick to your stomach (nausea), vomiting or sweats.   You have increasing belly (abdominal) discomfort.   There is blood in your urine, stool, or if you vomit blood.   There is pain in either shoulder in an area where a shoulder strap would be.   You have feelings of lightheadedness, or if you should have a fainting episode.   You have numbness, tingling, weakness, or problems with the use of your arms or legs.   Severe headaches not relieved with medications develop.   You have a change in bowel or bladder  control.   There is increasing pain in any areas of the body.  If you feel your symptoms are worsening, and you are not able to see your primary caregiver, return to the Emergency Department immediately. MAKE SURE YOU:   Understand these instructions.   Will watch your condition.   Will get help right away if you are not doing well or get worse.  Document Released: 05/01/2001 Document Revised: 07/26/2011 Document Reviewed: 03/24/2008 [employer] Patient Information 2012 [employer]. 

## 2012-02-04 NOTE — ED Notes (Signed)
Pt hasn't arrived to rm 4 yet.

## 2012-02-04 NOTE — ED Notes (Signed)
Patient transported to X-ray 

## 2012-02-04 NOTE — ED Notes (Signed)
Pt presenting to ed with c/o right side rib pain patient states chest pain last night. Pt denies chest pain at this time. Pt states it hurts to take a deep breath pt states positive shortness of breath pt denies nausea and vomiting. Pt states pain radiates into her back and she has positive dizziness

## 2012-02-04 NOTE — ED Provider Notes (Signed)
History     CSN: [id number]  Arrival date & time 02/04/12  1141   First MD Initiated Contact with Patient 02/04/12 1309      Chief Complaint  Patient presents with  . Chest Pain    Patient is a 51 y.o. female presenting with chest pain. The history is provided by the patient.  Chest Pain    Patient reports falling against a couch with the right side of her chest last night.  She's continued having right-sided chest pain since then.  She reports her pain is worse with movement and palpation.  She reports it hurts when she takes a deep breath.  She has no shortness of breath at this time.  There is no radiation of her pain.  The pain is located in her right chest.  Her pain is moderate to severe at this time.  She denies fevers or chills cough or congestion.   Past Medical History  Diagnosis Date  . Hypertension   . Diabetes mellitus   . Depression   . Chronic back pain   . Palpitations     Past Surgical History  Procedure Date  . Carpal tunnel release     No family history on file.  History  Substance Use Topics  . Smoking status: Former Smoker -- 0.5 packs/day for 3 years    Types: Cigarettes    Quit date: 09/01/2011  . Smokeless tobacco: Not on file  . Alcohol Use: Yes     occasional    OB History    Grav Para Term Preterm Abortions TAB SAB Ect Mult Living                  Review of Systems  Cardiovascular: Positive for chest pain.  All other systems reviewed and are negative.    Allergies  Penicillins  Home Medications   Current Outpatient Rx  Name Route Sig Dispense Refill  . ALBUTEROL SULFATE HFA 108 (90 BASE) MCG/ACT IN AERS Inhalation Inhale 2 puffs into the lungs 4 (four) times daily as needed. Wheezing/shortness of breath.    . ALPRAZOLAM 1 MG PO TABS Oral Take 1 mg by mouth daily as needed. Anxiety.    Marland Kitchen CARVEDILOL 3.125 MG PO TABS Oral Take 3.125 mg by mouth daily.      Marland Kitchen DIPHENHYDRAMINE HCL (SLEEP) 25 MG PO TABS Oral Take 25 mg by mouth  every 6 (six) hours as needed. For itching    . ESCITALOPRAM OXALATE 20 MG PO TABS Oral Take 20 mg by mouth daily.    Marland Kitchen GLIMEPIRIDE 4 MG PO TABS Oral Take 4 mg by mouth 2 (two) times daily.     Marland Kitchen HYDROCODONE-ACETAMINOPHEN 5-500 MG PO TABS Oral Take 1 tablet by mouth daily as needed. 1 tablet once daily as needed for pain.    Marland Kitchen HYDROXYZINE HCL 25 MG PO TABS Oral Take 25 mg by mouth every 6 (six) hours as needed. Itching.    . INSULIN ASPART 100 UNIT/ML [place] SOLN Subcutaneous Inject 20 Units into the skin 2 (two) times daily.     . INSULIN GLARGINE 100 UNIT/ML [place] SOLN Subcutaneous Inject 30 Units into the skin every morning.      Marland Kitchen KETOCONAZOLE 2 % EX SHAM Topical Apply 1 application topically 2 (two) times a week. To scalp.    Marland Kitchen METFORMIN HCL 500 MG PO TABS Oral Take 500 mg by mouth 2 (two) times daily with a meal.      . NYSTATIN-TRIAMCINOLONE  [id number] UNIT/GM-% EX CREA Topical Apply 1 application topically 2 (two) times daily as needed. To rash on left arm.    Marland Kitchen OLMESARTAN MEDOXOMIL-HCTZ 40-12.5 MG PO TABS Oral Take 1 tablet by mouth daily.      Marland Kitchen OMEPRAZOLE 40 MG PO CPDR Oral Take 80 mg by mouth daily.    . QUETIAPINE FUMARATE ER 150 MG PO TB24 Oral Take 150 mg by mouth at bedtime.      . OXYCODONE-ACETAMINOPHEN 5-325 MG PO TABS Oral Take 1 tablet by mouth every 4 (four) hours as needed for pain. 20 tablet 0    BP 177/107  Pulse 76  Temp 98.8 F (37.1 C) (Oral)  Resp 16  SpO2 100%  Physical Exam  Nursing note and vitals reviewed. Constitutional: She is oriented to person, place, and time. She appears well-developed and well-nourished. No distress.  HENT:  Head: Normocephalic and atraumatic.  Eyes: EOM are normal.  Neck: Normal range of motion.  Cardiovascular: Normal rate, regular rhythm and normal heart sounds.   Pulmonary/Chest: Effort normal and breath sounds normal.       Tenderness of right anterior and right lateral chest without deformity or crepitus  Abdominal: Soft. She  exhibits no distension. There is no tenderness.  Musculoskeletal: Normal range of motion.  Neurological: She is alert and oriented to person, place, and time.  Skin: Skin is warm and dry.  Psychiatric: She has a normal mood and affect. Judgment normal.    ED Course  Procedures (including critical care time)   Date: 02/04/2012  Rate: 74  Rhythm: normal sinus rhythm  QRS Axis: normal  Intervals: normal  ST/T Wave abnormalities: normal  Conduction Disutrbances: none  Narrative Interpretation:   Old EKG Reviewed: No significant changes noted     Labs Reviewed  BASIC METABOLIC PANEL - Abnormal; Notable for the following:    Glucose, Bld 126 (*)     All other components within normal limits  GLUCOSE, CAPILLARY - Abnormal; Notable for the following:    Glucose-Capillary 110 (*)     All other components within normal limits  CBC  POCT I-STAT TROPONIN I   Dg Chest 2 View  02/04/2012  *RADIOLOGY REPORT*  Clinical Data: Chest pain, shortness of breath, right rib soreness post fall, history diabetes, hypertension, smoking  CHEST - 2 VIEW  Comparison: 07/03/2011  Findings: Enlargement of cardiac silhouette. Mediastinal contours and pulmonary vascularity normal. Lungs clear. No pulmonary infiltrate, pleural effusion, or pneumothorax. No acute osseous findings identified.  IMPRESSION: No acute abnormalities.  Original Report Authenticated By: Burnetta Sabin, M.D.     1. Chest wall contusion       MDM  Significant tenderness to palpation of right anterior right lateral chest wall.  EKG and enzymes are normal as would be expected as this appears to be chest wall pain.  Chest x-ray demonstrates no pneumothorax or hemothorax.  She has no obvious right-sided rib fractures.  Her pain is improved in the emergency department.  Discharge home with pain medicine.        Hoy Morn, MD 02/04/12 1515

## 2012-02-04 NOTE — ED Notes (Signed)
pt

## 2012-07-23 ENCOUNTER — Encounter (HOSPITAL_COMMUNITY): Payer: Self-pay | Admitting: Emergency Medicine

## 2012-07-23 ENCOUNTER — Emergency Department (HOSPITAL_COMMUNITY): Payer: Medicare Other

## 2012-07-23 ENCOUNTER — Emergency Department (HOSPITAL_COMMUNITY)
Admission: EM | Admit: 2012-07-23 | Discharge: 2012-07-23 | Disposition: A | Discharge status: Home / Self Care | Payer: Medicare Other | Attending: Emergency Medicine | Admitting: Emergency Medicine

## 2012-07-23 DIAGNOSIS — G8929 Other chronic pain: Secondary | ICD-10-CM | POA: Insufficient documentation

## 2012-07-23 DIAGNOSIS — Y9301 Activity, walking, marching and hiking: Secondary | ICD-10-CM | POA: Insufficient documentation

## 2012-07-23 DIAGNOSIS — F3289 Other specified depressive episodes: Secondary | ICD-10-CM | POA: Insufficient documentation

## 2012-07-23 DIAGNOSIS — F17200 Nicotine dependence, unspecified, uncomplicated: Secondary | ICD-10-CM | POA: Insufficient documentation

## 2012-07-23 DIAGNOSIS — I10 Essential (primary) hypertension: Secondary | ICD-10-CM | POA: Insufficient documentation

## 2012-07-23 DIAGNOSIS — E119 Type 2 diabetes mellitus without complications: Secondary | ICD-10-CM | POA: Insufficient documentation

## 2012-07-23 DIAGNOSIS — M549 Dorsalgia, unspecified: Secondary | ICD-10-CM | POA: Insufficient documentation

## 2012-07-23 DIAGNOSIS — F329 Major depressive disorder, single episode, unspecified: Secondary | ICD-10-CM | POA: Insufficient documentation

## 2012-07-23 DIAGNOSIS — Z794 Long term (current) use of insulin: Secondary | ICD-10-CM | POA: Insufficient documentation

## 2012-07-23 DIAGNOSIS — Z79899 Other long term (current) drug therapy: Secondary | ICD-10-CM | POA: Insufficient documentation

## 2012-07-23 DIAGNOSIS — S93609A Unspecified sprain of unspecified foot, initial encounter: Secondary | ICD-10-CM | POA: Insufficient documentation

## 2012-07-23 DIAGNOSIS — W2209XA Striking against other stationary object, initial encounter: Secondary | ICD-10-CM | POA: Insufficient documentation

## 2012-07-23 DIAGNOSIS — S93509A Unspecified sprain of unspecified toe(s), initial encounter: Secondary | ICD-10-CM

## 2012-07-23 DIAGNOSIS — Y929 Unspecified place or not applicable: Secondary | ICD-10-CM | POA: Insufficient documentation

## 2012-07-23 MED ORDER — HYDROCODONE-ACETAMINOPHEN 5-325 MG PO TABS
1.0000 | ORAL_TABLET | Freq: Once | ORAL | Status: AC
  Administered 2012-07-23: 1 via ORAL
  Filled 2012-07-23: qty 1

## 2012-07-23 NOTE — ED Notes (Signed)
L/side of l/foot slightly swollen and tender. Pt able to ambulate without assistance. Pt reports l/foot pain after tripping over rug 2 days ago.

## 2012-07-23 NOTE — ED Provider Notes (Signed)
History     CSN: [id number]  Arrival date & time 07/23/12  [id number]   First MD Initiated Contact with Patient 07/23/12 1000      No chief complaint on file.   (Consider location/radiation/quality/duration/timing/severity/associated sxs/prior treatment) HPI  51 year old female with history of diabetes presents complaining of left toes injury. Patient reports she was walking down several steps 2 days ago when she missed step after tripping on a rug and accidentally kicked her toes against the step.  Denies falling, hitting head or LOC.  C/o acute onset of sharp throbbing pain to 4th-5th toes.  Pain is non radiating, worsening with walking, improves with rest.  Denies ankle or knee pain.  No treatment tried. Denies bleeding, or numbness.   Past Medical History  Diagnosis Date  . Hypertension   . Diabetes mellitus   . Depression   . Chronic back pain   . Palpitations     Past Surgical History  Procedure Date  . Carpal tunnel release     No family history on file.  History  Substance Use Topics  . Smoking status: Former Smoker -- 0.5 packs/day for 3 years    Types: Cigarettes    Quit date: 09/01/2011  . Smokeless tobacco: Not on file  . Alcohol Use: Yes     Comment: occasional    OB History    Grav Para Term Preterm Abortions TAB SAB Ect Mult Living                  Review of Systems  Constitutional: Negative for fever.  Skin: Negative for rash and wound.  Neurological: Negative for numbness.    Allergies  Penicillins  Home Medications   Current Outpatient Rx  Name  Route  Sig  Dispense  Refill  . ALPRAZOLAM 1 MG PO TABS   Oral   Take 1 mg by mouth daily as needed. Anxiety.         Marland Kitchen DIPHENHYDRAMINE HCL (SLEEP) 25 MG PO TABS   Oral   Take 25 mg by mouth every 6 (six) hours as needed. For itching         . ESCITALOPRAM OXALATE 20 MG PO TABS   Oral   Take 20 mg by mouth daily.         Marland Kitchen GLIMEPIRIDE 4 MG PO TABS   Oral   Take 4 mg by mouth 2 (two)  times daily.          Marland Kitchen HYDROCODONE-ACETAMINOPHEN 5-500 MG PO TABS   Oral   Take 1 tablet by mouth daily as needed. 1 tablet once daily as needed for pain.         Marland Kitchen HYDROXYZINE HCL 25 MG PO TABS   Oral   Take 25 mg by mouth every 6 (six) hours as needed. Itching.         . INSULIN ASPART 100 UNIT/ML [place] SOLN   Subcutaneous   Inject 20 Units into the skin 2 (two) times daily.          . INSULIN GLARGINE 100 UNIT/ML [place] SOLN   Subcutaneous   Inject 30 Units into the skin every morning.           Marland Kitchen METFORMIN HCL 500 MG PO TABS   Oral   Take 500 mg by mouth 2 (two) times daily with a meal.           . NYSTATIN-TRIAMCINOLONE 100000-0.1 UNIT/GM-% EX CREA   Topical   Apply 1  application topically 2 (two) times daily as needed. To rash on left arm.         Marland Kitchen OLMESARTAN MEDOXOMIL-HCTZ 40-12.5 MG PO TABS   Oral   Take 1 tablet by mouth daily.           Marland Kitchen OMEPRAZOLE 40 MG PO CPDR   Oral   Take 80 mg by mouth daily.         . QUETIAPINE FUMARATE ER 150 MG PO TB24   Oral   Take 150 mg by mouth at bedtime.             There were no vitals taken for this visit.  Physical Exam  Nursing note and vitals reviewed. Constitutional: She appears well-developed and well-nourished. No distress.  HENT:  Head: Atraumatic.  Eyes: Conjunctivae normal are normal.  Neck: Neck supple.  Musculoskeletal: Normal range of motion. She exhibits tenderness (L foot: tenderness to 4th-5th toe on palpation, no deformity, mildly edematous with faint ecchymosis. brisk cap refill, sensation intact. No ankle tenderness.  Able to ambulate.  ).  Neurological: She is alert.  Skin: Skin is warm. No rash noted.    ED Course  Procedures (including critical care time)  Labs Reviewed - No data to display No results found.   No diagnosis found.  Dg Foot Complete Left  07/23/2012  *RADIOLOGY REPORT*  Clinical Data: [place].  Left foot pain.  LEFT FOOT - COMPLETE 3+ VIEW  Comparison: None   Findings: The joint spaces are maintained.  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  No acute fracture is identified.  IMPRESSION: No acute bony findings.   Original Report Authenticated By: Marijo Sanes, M.D.    1. Toe sprain, left foot   MDM  L toes injury, will order xray.    11:43 AM Xray neg for fx.  RICE therapy discussed.  Pt able to ambulate, will d/c.    BP 150/87  Pulse 85  Temp 98 F (36.7 C) (Oral)  Resp 18  SpO2 100%  I have reviewed nursing notes and vital signs. I personally reviewed the imaging tests through PACS system  I reviewed available ER/hospitalization records thought the EMR        Domenic Moras, [place] 07/23/12 1143

## 2012-07-23 NOTE — ED Provider Notes (Signed)
Medical screening examination/treatment/procedure(s) were performed by non-physician practitioner and as supervising physician I was immediately available for consultation/collaboration.  Varney Biles, MD 07/23/12 [phone]

## 2013-01-15 ENCOUNTER — Other Ambulatory Visit: Payer: Self-pay | Admitting: Orthopedic Surgery

## 2013-01-15 DIAGNOSIS — R102 Pelvic and perineal pain: Secondary | ICD-10-CM

## 2013-01-16 ENCOUNTER — Other Ambulatory Visit: Payer: Self-pay | Admitting: Orthopedic Surgery

## 2013-01-16 DIAGNOSIS — R102 Pelvic and perineal pain: Secondary | ICD-10-CM

## 2013-01-23 ENCOUNTER — Other Ambulatory Visit: Payer: Medicare Other

## 2013-01-27 ENCOUNTER — Ambulatory Visit
Admission: RE | Admit: 2013-01-27 | Discharge: 2013-01-27 | Disposition: A | Discharge status: Home / Self Care | Payer: Medicare Other | Source: Ambulatory Visit | Attending: Orthopedic Surgery | Admitting: Orthopedic Surgery

## 2013-01-27 DIAGNOSIS — R102 Pelvic and perineal pain: Secondary | ICD-10-CM

## 2013-11-03 ENCOUNTER — Encounter: Payer: Self-pay | Admitting: Gastroenterology

## 2013-12-28 ENCOUNTER — Encounter: Payer: Medicare Other | Admitting: Gastroenterology

## 2014-01-18 DIAGNOSIS — N179 Acute kidney failure, unspecified: Secondary | ICD-10-CM

## 2014-01-18 HISTORY — DX: Acute kidney failure, unspecified: N17.9

## 2014-01-29 ENCOUNTER — Inpatient Hospital Stay (HOSPITAL_COMMUNITY)
Admission: EM | Admit: 2014-01-29 | Discharge: 2014-02-02 | DRG: 683 | Disposition: A | Discharge status: Home / Self Care | Payer: Medicare Other | Attending: Internal Medicine | Admitting: Internal Medicine

## 2014-01-29 ENCOUNTER — Emergency Department (HOSPITAL_COMMUNITY): Payer: Medicare Other

## 2014-01-29 ENCOUNTER — Encounter (HOSPITAL_COMMUNITY): Payer: Self-pay | Admitting: Emergency Medicine

## 2014-01-29 DIAGNOSIS — R945 Abnormal results of liver function studies: Secondary | ICD-10-CM

## 2014-01-29 DIAGNOSIS — R740 Nonspecific elevation of levels of transaminase and lactic acid dehydrogenase [LDH]: Secondary | ICD-10-CM

## 2014-01-29 DIAGNOSIS — Z833 Family history of diabetes mellitus: Secondary | ICD-10-CM

## 2014-01-29 DIAGNOSIS — I10 Essential (primary) hypertension: Secondary | ICD-10-CM | POA: Diagnosis present

## 2014-01-29 DIAGNOSIS — I959 Hypotension, unspecified: Secondary | ICD-10-CM | POA: Diagnosis present

## 2014-01-29 DIAGNOSIS — Z8249 Family history of ischemic heart disease and other diseases of the circulatory system: Secondary | ICD-10-CM

## 2014-01-29 DIAGNOSIS — S02610A Fracture of condylar process of mandible, unspecified side, initial encounter for closed fracture: Secondary | ICD-10-CM | POA: Diagnosis not present

## 2014-01-29 DIAGNOSIS — K732 Chronic active hepatitis, not elsewhere classified: Secondary | ICD-10-CM | POA: Diagnosis present

## 2014-01-29 DIAGNOSIS — R9431 Abnormal electrocardiogram [ECG] [EKG]: Secondary | ICD-10-CM | POA: Diagnosis present

## 2014-01-29 DIAGNOSIS — E669 Obesity, unspecified: Secondary | ICD-10-CM | POA: Diagnosis present

## 2014-01-29 DIAGNOSIS — E1142 Type 2 diabetes mellitus with diabetic polyneuropathy: Secondary | ICD-10-CM | POA: Diagnosis present

## 2014-01-29 DIAGNOSIS — E1169 Type 2 diabetes mellitus with other specified complication: Secondary | ICD-10-CM | POA: Diagnosis present

## 2014-01-29 DIAGNOSIS — F17200 Nicotine dependence, unspecified, uncomplicated: Secondary | ICD-10-CM | POA: Diagnosis present

## 2014-01-29 DIAGNOSIS — R7989 Other specified abnormal findings of blood chemistry: Secondary | ICD-10-CM | POA: Diagnosis present

## 2014-01-29 DIAGNOSIS — E869 Volume depletion, unspecified: Secondary | ICD-10-CM | POA: Diagnosis present

## 2014-01-29 DIAGNOSIS — Z823 Family history of stroke: Secondary | ICD-10-CM

## 2014-01-29 DIAGNOSIS — N179 Acute kidney failure, unspecified: Principal | ICD-10-CM | POA: Diagnosis present

## 2014-01-29 DIAGNOSIS — R7402 Elevation of levels of lactic acid dehydrogenase (LDH): Secondary | ICD-10-CM | POA: Diagnosis present

## 2014-01-29 DIAGNOSIS — E1149 Type 2 diabetes mellitus with other diabetic neurological complication: Secondary | ICD-10-CM | POA: Diagnosis present

## 2014-01-29 DIAGNOSIS — N1832 Chronic kidney disease, stage 3b: Secondary | ICD-10-CM | POA: Diagnosis present

## 2014-01-29 DIAGNOSIS — G8929 Other chronic pain: Secondary | ICD-10-CM | POA: Diagnosis present

## 2014-01-29 DIAGNOSIS — S02609A Fracture of mandible, unspecified, initial encounter for closed fracture: Secondary | ICD-10-CM | POA: Diagnosis present

## 2014-01-29 DIAGNOSIS — R209 Unspecified disturbances of skin sensation: Secondary | ICD-10-CM | POA: Diagnosis not present

## 2014-01-29 DIAGNOSIS — K3184 Gastroparesis: Secondary | ICD-10-CM | POA: Diagnosis present

## 2014-01-29 DIAGNOSIS — K8020 Calculus of gallbladder without cholecystitis without obstruction: Secondary | ICD-10-CM | POA: Diagnosis present

## 2014-01-29 DIAGNOSIS — W19XXXA Unspecified fall, initial encounter: Secondary | ICD-10-CM | POA: Diagnosis present

## 2014-01-29 DIAGNOSIS — E119 Type 2 diabetes mellitus without complications: Secondary | ICD-10-CM | POA: Diagnosis present

## 2014-01-29 DIAGNOSIS — Z825 Family history of asthma and other chronic lower respiratory diseases: Secondary | ICD-10-CM

## 2014-01-29 DIAGNOSIS — R109 Unspecified abdominal pain: Secondary | ICD-10-CM | POA: Diagnosis present

## 2014-01-29 DIAGNOSIS — N19 Unspecified kidney failure: Secondary | ICD-10-CM

## 2014-01-29 DIAGNOSIS — R202 Paresthesia of skin: Secondary | ICD-10-CM | POA: Diagnosis present

## 2014-01-29 DIAGNOSIS — R7401 Elevation of levels of liver transaminase levels: Secondary | ICD-10-CM | POA: Diagnosis present

## 2014-01-29 DIAGNOSIS — R55 Syncope and collapse: Secondary | ICD-10-CM | POA: Diagnosis present

## 2014-01-29 DIAGNOSIS — F319 Bipolar disorder, unspecified: Secondary | ICD-10-CM | POA: Diagnosis present

## 2014-01-29 DIAGNOSIS — Z794 Long term (current) use of insulin: Secondary | ICD-10-CM

## 2014-01-29 DIAGNOSIS — Z79899 Other long term (current) drug therapy: Secondary | ICD-10-CM

## 2014-01-29 DIAGNOSIS — Z6832 Body mass index (BMI) 32.0-32.9, adult: Secondary | ICD-10-CM

## 2014-01-29 DIAGNOSIS — M79606 Pain in leg, unspecified: Secondary | ICD-10-CM

## 2014-01-29 DIAGNOSIS — Z880 Allergy status to penicillin: Secondary | ICD-10-CM

## 2014-01-29 HISTORY — DX: Chronic active hepatitis, not elsewhere classified: K73.2

## 2014-01-29 LAB — CBC WITH DIFFERENTIAL/PLATELET
BASOS PCT: 1 % (ref 0–1)
Basophils Absolute: 0.1 10*3/uL (ref 0.0–0.1)
Eosinophils Absolute: 0.7 10*3/uL (ref 0.0–0.7)
Eosinophils Relative: 12 % — ABNORMAL HIGH (ref 0–5)
HEMATOCRIT: 34.4 % — AB (ref 36.0–46.0)
Hemoglobin: 12 g/dL (ref 12.0–15.0)
LYMPHS ABS: 1.4 10*3/uL (ref 0.7–4.0)
Lymphocytes Relative: 23 % (ref 12–46)
MCH: 30.6 pg (ref 26.0–34.0)
MCHC: 34.9 g/dL (ref 30.0–36.0)
MCV: 87.8 fL (ref 78.0–100.0)
MONOS PCT: 14 % — AB (ref 3–12)
Monocytes Absolute: 0.9 10*3/uL (ref 0.1–1.0)
NEUTROS ABS: 3 10*3/uL (ref 1.7–7.7)
Neutrophils Relative %: 50 % (ref 43–77)
Platelets: 187 10*3/uL (ref 150–400)
RBC: 3.92 MIL/uL (ref 3.87–5.11)
RDW: 12 % (ref 11.5–15.5)
WBC: 6.1 10*3/uL (ref 4.0–10.5)

## 2014-01-29 LAB — URINE MICROSCOPIC-ADD ON

## 2014-01-29 LAB — RAPID URINE DRUG SCREEN, HOSP PERFORMED
Amphetamines: NOT DETECTED
BARBITURATES: NOT DETECTED
Benzodiazepines: NOT DETECTED
Cocaine: POSITIVE — AB
OPIATES: NOT DETECTED
Tetrahydrocannabinol: NOT DETECTED

## 2014-01-29 LAB — COMPREHENSIVE METABOLIC PANEL
ALBUMIN: 3.3 g/dL — AB (ref 3.5–5.2)
ALT: 38 U/L — ABNORMAL HIGH (ref 0–35)
AST: 94 U/L — ABNORMAL HIGH (ref 0–37)
Alkaline Phosphatase: 134 U/L — ABNORMAL HIGH (ref 39–117)
BILIRUBIN TOTAL: 0.3 mg/dL (ref 0.3–1.2)
BUN: 55 mg/dL — AB (ref 6–23)
CHLORIDE: 90 meq/L — AB (ref 96–112)
CO2: 24 mEq/L (ref 19–32)
CREATININE: 5.38 mg/dL — AB (ref 0.50–1.10)
Calcium: 9.8 mg/dL (ref 8.4–10.5)
GFR, EST AFRICAN AMERICAN: 10 mL/min — AB (ref >90)
GFR, EST NON AFRICAN AMERICAN: 8 mL/min — AB (ref >90)
GLUCOSE: 160 mg/dL — AB (ref 70–99)
Potassium: 3.5 mEq/L — ABNORMAL LOW (ref 3.7–5.3)
Sodium: 132 mEq/L — ABNORMAL LOW (ref 137–147)
Total Protein: 8.2 g/dL (ref 6.0–8.3)

## 2014-01-29 LAB — URINALYSIS, ROUTINE W REFLEX MICROSCOPIC
Bilirubin Urine: NEGATIVE
GLUCOSE, UA: 500 mg/dL — AB
KETONES UR: NEGATIVE mg/dL
NITRITE: NEGATIVE
Protein, ur: NEGATIVE mg/dL
Specific Gravity, Urine: 1.009 (ref 1.005–1.030)
UROBILINOGEN UA: 0.2 mg/dL (ref 0.0–1.0)
pH: 6 (ref 5.0–8.0)

## 2014-01-29 LAB — GLUCOSE, CAPILLARY: GLUCOSE-CAPILLARY: 243 mg/dL — AB (ref 70–99)

## 2014-01-29 MED ORDER — ACETAMINOPHEN 650 MG RE SUPP: 650.0000 mg | Freq: Four times a day (QID) | RECTAL | Status: DC | PRN

## 2014-01-29 MED ORDER — PANTOPRAZOLE SODIUM 40 MG PO TBEC
40.0000 mg | DELAYED_RELEASE_TABLET | Freq: Every day | ORAL | Status: DC
  Administered 2014-01-30 – 2014-02-02 (×4): 40 mg via ORAL
  Filled 2014-01-29 (×4): qty 1

## 2014-01-29 MED ORDER — SODIUM CHLORIDE 0.9 % IJ SOLN
3.0000 mL | Freq: Two times a day (BID) | INTRAMUSCULAR | Status: DC
  Administered 2014-01-29 – 2014-01-31 (×2): 3 mL via INTRAVENOUS

## 2014-01-29 MED ORDER — OXYCODONE-ACETAMINOPHEN 5-325 MG PO TABS
1.0000 | ORAL_TABLET | ORAL | Status: DC | PRN
  Administered 2014-01-29 – 2014-02-02 (×17): 1 via ORAL
  Filled 2014-01-29 (×17): qty 1

## 2014-01-29 MED ORDER — INSULIN GLARGINE 100 UNIT/ML ~~LOC~~ SOLN
50.0000 [IU] | Freq: Every day | SUBCUTANEOUS | Status: DC
  Filled 2014-01-29: qty 0.5

## 2014-01-29 MED ORDER — SODIUM CHLORIDE 0.9 % IV BOLUS (SEPSIS)
1000.0000 mL | INTRAVENOUS | Status: AC
  Administered 2014-01-29: 1000 mL via INTRAVENOUS

## 2014-01-29 MED ORDER — OXYCODONE-ACETAMINOPHEN 5-325 MG PO TABS
1.0000 | ORAL_TABLET | Freq: Once | ORAL | Status: AC
  Administered 2014-01-29: 1 via ORAL
  Filled 2014-01-29: qty 1

## 2014-01-29 MED ORDER — INSULIN ASPART 100 UNIT/ML ~~LOC~~ SOLN
0.0000 [IU] | Freq: Every day | SUBCUTANEOUS | Status: DC
  Administered 2014-01-29: 2 [IU] via SUBCUTANEOUS

## 2014-01-29 MED ORDER — SODIUM CHLORIDE 0.9 % IV SOLN
INTRAVENOUS | Status: AC
  Administered 2014-01-29: 23:00:00 via INTRAVENOUS

## 2014-01-29 MED ORDER — ACETAMINOPHEN 325 MG PO TABS
650.0000 mg | ORAL_TABLET | Freq: Four times a day (QID) | ORAL | Status: DC | PRN
  Filled 2014-01-29: qty 2

## 2014-01-29 MED ORDER — QUETIAPINE FUMARATE ER 50 MG PO TB24
150.0000 mg | ORAL_TABLET | Freq: Every day | ORAL | Status: DC
  Administered 2014-01-29 – 2014-02-01 (×4): 150 mg via ORAL
  Filled 2014-01-29 (×7): qty 3

## 2014-01-29 MED ORDER — INSULIN ASPART 100 UNIT/ML ~~LOC~~ SOLN
0.0000 [IU] | Freq: Three times a day (TID) | SUBCUTANEOUS | Status: DC
  Administered 2014-01-30: 5 [IU] via SUBCUTANEOUS
  Administered 2014-01-30: 3 [IU] via SUBCUTANEOUS
  Administered 2014-01-30: 1 [IU] via SUBCUTANEOUS
  Administered 2014-01-31 (×2): 3 [IU] via SUBCUTANEOUS
  Administered 2014-01-31: 5 [IU] via SUBCUTANEOUS
  Administered 2014-02-01: 2 [IU] via SUBCUTANEOUS
  Administered 2014-02-01 (×2): 7 [IU] via SUBCUTANEOUS
  Administered 2014-02-02 (×2): 2 [IU] via SUBCUTANEOUS

## 2014-01-29 MED ORDER — GABAPENTIN 100 MG PO CAPS
100.0000 mg | ORAL_CAPSULE | Freq: Every day | ORAL | Status: DC
  Administered 2014-01-29 – 2014-01-31 (×3): 100 mg via ORAL
  Filled 2014-01-29 (×5): qty 1

## 2014-01-29 MED ORDER — DOCUSATE SODIUM 100 MG PO CAPS
100.0000 mg | ORAL_CAPSULE | Freq: Two times a day (BID) | ORAL | Status: DC
  Administered 2014-01-29 – 2014-02-01 (×6): 100 mg via ORAL
  Filled 2014-01-29 (×7): qty 1

## 2014-01-29 NOTE — ED Notes (Addendum)
Per pt family, fell on Monday and now has B/L leg pain-family member answering for her-patient not responding to questions-poor eye contact-appears agitated

## 2014-01-29 NOTE — H&P (Signed)
PCP:     Philis Fendt, MD    Chief Complaint:  Leg pain and jaw pain  HPI: Carol [name] is a 53 y.o. female   has a past medical history of Hypertension; Diabetes mellitus; Depression; Chronic back pain; and Palpitations.   Presented with  Patient reports 5 day she had a syncopal event when she stood up to answer the door. She reports that for the past 1 month she has been lightheaded when trying to stand up too fast. Patient fell hitting her jaw resulting in laceration.  For the past 1 week she has not eaten well but has been drinking some water. She has been chewing on a lot of Ice.  1 Month ago she was started on EDARBYCLOR. She reports that her light headedness have started since then.  Since her fall she have had some burning sensation in her right thigh. She reports pain with moving her right thigh. She does report some lower back pain as well.  In ER her labs showed cr of 5.38 with normal baseline 2 years ago. She repots her PCP checked her labs in April but not sure what labs were taken. Patient has been urinating well. Denies foamy or dark urine. She has reported increased thirst this has been going on for years. She reports drinking large amount of water but when questioned it amounts to 6 8 Oz bottles of water a a day.    Denies heavy menses, reports easy bruising. No melena no blood in stool.   Hospitalist was called for admission for Acute renal failure  Review of Systems:    Pertinent positives include: light headed  Constitutional:  No weight loss, night sweats, Fevers, chills, fatigue, weight loss  HEENT:  No headaches, Difficulty swallowing,Tooth/dental problems,Sore throat,  No sneezing, itching, ear ache, nasal congestion, post nasal drip,  Cardio-vascular:  No chest pain, Orthopnea, PND, anasarca, dizziness, palpitations.no Bilateral lower extremity swelling  GI:  No heartburn, indigestion, abdominal pain, nausea, vomiting, diarrhea, change in bowel  habits, loss of appetite, melena, blood in stool, hematemesis Resp:  no shortness of breath at rest. No dyspnea on exertion, No excess mucus, no productive cough, No non-productive cough, No coughing up of blood.No change in color of mucus.No wheezing. Skin:  no rash or lesions. No jaundice GU:  no dysuria, change in color of urine, no urgency or frequency. No straining to urinate.  No flank pain.  Musculoskeletal:  No joint pain or no joint swelling. No decreased range of motion. No back pain.  Psych:  No change in mood or affect. No depression or anxiety. No memory loss.  Neuro: no localizing neurological complaints, no tingling, no weakness, no double vision, no gait abnormality, no slurred speech, no confusion  Otherwise ROS are negative except for above, 10 systems were reviewed  Past Medical History: Past Medical History  Diagnosis Date  . Hypertension   . Diabetes mellitus   . Depression   . Chronic back pain   . Palpitations    Past Surgical History  Procedure Laterality Date  . Carpal tunnel release       Medications: Prior to Admission medications   Medication Sig Start Date End Date Taking? Authorizing Provider  Azilsartan-Chlorthalidone (EDARBYCLOR) 40-12.5 MG TABS Take 1 tablet by mouth daily.   Yes Historical Provider, MD  glimepiride (AMARYL) 4 MG tablet Take 4 mg by mouth 2 (two) times daily.    Yes Historical Provider, MD  insulin aspart (NOVOLOG) 100 UNIT/ML injection  Inject 1-12 Units into the skin 3 (three) times daily with meals. Sliding scale   Yes Historical Provider, MD  insulin glargine (LANTUS) 100 UNIT/ML injection Inject 50 Units into the skin every morning.    Yes Historical Provider, MD  metFORMIN (GLUCOPHAGE) 500 MG tablet Take 500 mg by mouth 2 (two) times daily with a meal.     Yes Historical Provider, MD  nebivolol (BYSTOLIC) 10 MG tablet Take 10 mg by mouth daily.   Yes Historical Provider, MD  QUEtiapine Fumarate (SEROQUEL XR) 150 MG 24 hr  tablet Take 150 mg by mouth at bedtime.     Yes Historical Provider, MD  simvastatin (ZOCOR) 10 MG tablet Take 10 mg by mouth daily at 6 PM.   Yes Historical Provider, MD    Allergies:   Allergies  Allergen Reactions  . Penicillins     Really bad yeast infection    Social History:  Ambulatory   independently   Lives at home  With family     reports that she has been smoking Cigarettes.  She has a 1.5 pack-year smoking history. She does not have any smokeless tobacco history on file. She reports that she drinks alcohol. She reports that she does not use illicit drugs.  Denies cocaine but positive on  Drug screen  Family History: family history includes Asthma in her other; Diabetes in her mother; Hypertension in her father; Kidney disease in her mother; Seizures in her father; Stroke in her father.    Physical Exam: Patient Vitals for the past 24 hrs:  BP Temp Temp src Pulse Resp SpO2  01/29/14 2030 107/63 mmHg - - 64 18 100 %  01/29/14 1753 91/54 mmHg 98 F (36.7 C) Oral 63 20 100 %    1. General:  in No Acute distress 2. Psychological: Alert and  Oriented 3. Head/ENT:   Moist   Mucous Membranes                          Head Non traumatic, neck supple                          Normal   Dentition 4. SKIN: normal   Skin turgor,  Skin clean Dry and intact no rash 5. Heart: Regular rate and rhythm no Murmur, Rub or gallop 6. Lungs: Clear to auscultation bilaterally, no wheezes or crackles   7. Abdomen: Soft, non-tender, Non distended, obese 8. Lower extremities: no clubbing, cyanosis, or edema 9. Neurologically Grossly intact, moving all 4 extremities equally, paresthesia over thigh on right lower extremity noted 10. MSK: Normal range of motion   body mass index is unknown because there is no height or weight on file.   Labs on Admission:   Recent Labs  01/29/14 1717  NA 132*  K 3.5*  CL 90*  CO2 24  GLUCOSE 160*  BUN 55*  CREATININE 5.38*  CALCIUM 9.8     Recent Labs  01/29/14 1717  AST 94*  ALT 38*  ALKPHOS 134*  BILITOT 0.3  PROT 8.2  ALBUMIN 3.3*   No results found for this basename: LIPASE, AMYLASE,  in the last 72 hours  Recent Labs  01/29/14 1717  WBC 6.1  NEUTROABS 3.0  HGB 12.0  HCT 34.4*  MCV 87.8  PLT 187   No results found for this basename: CKTOTAL, CKMB, CKMBINDEX, TROPONINI,  in the last 72 hours No results  found for this basename: TSH, T4TOTAL, FREET3, T3FREE, THYROIDAB,  in the last 72 hours No results found for this basename: VITAMINB12, FOLATE, FERRITIN, TIBC, IRON, RETICCTPCT,  in the last 72 hours No results found for this basename: HGBA1C    CrCl is unknown because there is no height on file for the current visit. ABG    Component Value Date/Time   TCO2 24 09/22/2011 1152     Lab Results  Component Value Date   DDIMER 0.32 07/03/2011     Other results:  I have pearsonaly reviewed this: ECG REPORT  Rate: 61   Rhythm: NSR ST&T Change: no ischemia but some flattening of t waves QTc 468   BNP (last 3 results) No results found for this basename: PROBNP,  in the last 8760 hours  There were no vitals filed for this visit.   Cultures:    Component Value Date/Time   SDES URINE, CATHETERIZED 07/03/2011 2203   SPECREQUEST NONE 07/03/2011 2203   CULT  Value: GROUP B STREP(S.AGALACTIAE)ISOLATED Note: TESTING AGAINST S. AGALACTIAE NOT ROUTINELY PERFORMED DUE TO PREDICTABILITY OF AMP/PEN/VAN SUSCEPTIBILITY. 07/03/2011 2203   REPTSTATUS 07/05/2011 FINAL 07/03/2011 2203     Radiological Exams on Admission: Dg Hip Complete Right  01/29/2014   CLINICAL DATA:  Fall.  EXAM: RIGHT HIP - COMPLETE 2+ VIEW  COMPARISON:  None.  FINDINGS: Calcifications pelvis consistent with phleboliths. No acute bony or joint abnormality identified. No evidence of fracture or dislocation.  IMPRESSION: No acute abnormality identified.   Electronically Signed   By: Marcello Moores  Register   On: 01/29/2014 20:10   Ct Head Wo  Contrast  01/29/2014   CLINICAL DATA:  Status post fall  EXAM: CT HEAD WITHOUT CONTRAST  CT MAXILLOFACIAL WITHOUT CONTRAST  TECHNIQUE: Multidetector CT imaging of the head and maxillofacial structures were performed using the standard protocol without intravenous contrast. Multiplanar CT image reconstructions of the maxillofacial structures were also generated.  COMPARISON:  10/26/2009  FINDINGS: CT HEAD FINDINGS  No acute cortical infarct, hemorrhage, or mass lesion ispresent. Ventricles are of normal size. No significant extra-axial fluid collection is present. The paranasal sinuses andmastoid air cells are clear. The osseous skull is intact.  CT MAXILLOFACIAL FINDINGS  Comminuted fracture involves the left mandibular condyle. Mild ventral angulation of the distal fracture fragments. No dislocation. The remaining facial bones appear intact.  IMPRESSION: 1. No acute intracranial abnormalities. 2. Acute fracture of the left mandibular condyle.   Electronically Signed   By: Kerby Moors M.D.   On: 01/29/2014 18:54   Ct Maxillofacial Wo Cm  01/29/2014   CLINICAL DATA:  Status post fall  EXAM: CT HEAD WITHOUT CONTRAST  CT MAXILLOFACIAL WITHOUT CONTRAST  TECHNIQUE: Multidetector CT imaging of the head and maxillofacial structures were performed using the standard protocol without intravenous contrast. Multiplanar CT image reconstructions of the maxillofacial structures were also generated.  COMPARISON:  10/26/2009  FINDINGS: CT HEAD FINDINGS  No acute cortical infarct, hemorrhage, or mass lesion ispresent. Ventricles are of normal size. No significant extra-axial fluid collection is present. The paranasal sinuses andmastoid air cells are clear. The osseous skull is intact.  CT MAXILLOFACIAL FINDINGS  Comminuted fracture involves the left mandibular condyle. Mild ventral angulation of the distal fracture fragments. No dislocation. The remaining facial bones appear intact.  IMPRESSION: 1. No acute intracranial  abnormalities. 2. Acute fracture of the left mandibular condyle.   Electronically Signed   By: Kerby Moors M.D.   On: 01/29/2014 18:54    Chart  has been reviewed  Assessment/Plan  53 year old female with acute renal failure in a setting of recent initiation of ARB and diuretic, evidence of hypotension. Recent syncope in a setting of likely orthostasis and cocaine use  Present on Admission:  . Acute renal failure - obtain urine electrolytes, renal ultrasound, control blood pressure, controlled diabetes stop ARB and chlorthalidone likely prerenal we'll give IV fluids. Check orthostatics. Renal consult has been called will see patient in a.m. for now keep at [facility] long and continue to follow creatinine  . Diabetes mellitus - sensitive sliding scale in the setting of renal failure hold metformin and by mouth meds continue Lantus per patient her diabetes control has recently improved most likely in the setting of renal failure and poor insulin clearance  . Hypertension currently low blood pressures also history of cocaine use. bystolic and ARB  . Closed jaw fracture - ENT is aware recommend mechanical diet to see in the morning  . Paresthesia -will write for Neurontin, given back pain and recent fall obtain lumbar films Recent syncope - in the setting of hypertension, acute renal failure orthostatic tach symptoms and cocaine use. Will check echo gram monitor on telemetry Prolonged QTC - slight prolongation QTC Will avoid medications that could exacerbate. Of note patient is on Seroquel Elevated LFTs- patient denies alcohol abuse, obtain hepatic panel, liver ultrasound check INR  Prophylaxis:  SCD, Protonix  CODE STATUS:  FULL CODE   Other plan as per orders.  I have spent a total of 65 min on this admission except I was taken to discuss care of with nephrology  [place] 01/29/2014, 8:48 PM  [facility]  Pager [phone]   If 7AM-7PM, please contact the day team taking  care of the patient  [email or web address]  Password [email or web address]

## 2014-01-29 NOTE — ED Notes (Signed)
Patient is aware that we need a urine specimen. Will give specimen when able.

## 2014-01-29 NOTE — ED Notes (Signed)
Pt reports lower back pain and bilateral burning pain in her thighs that started about 2 weeks ago. Pt states burning in legs caused her to fall on Monday. Pt's husband reports pt hit her chin on the floor and lost consciousness for about 1.5 minutes when she fell. Pt reports constant dizziness and lightheadedness. Nausea and vomiting onset after the fall. Pt reports emesis x 3 today, has been unable to keep down any food or fluids for the past few days. Denies diarrhea. Denies abdominal pain.

## 2014-01-29 NOTE — ED Provider Notes (Signed)
CSN: [id number]     Arrival date & time 01/29/14  1611 History   First MD Initiated Contact with Patient 01/29/14 1626     Chief Complaint  Patient presents with  . Fall     (Consider location/radiation/quality/duration/timing/severity/associated sxs/prior Treatment) Patient is a 53 y.o. female presenting with fall and leg pain. The history is provided by the patient.  Fall This is a recurrent problem. Episode onset: 4 days ago. Episode frequency: once. The problem has been resolved. Associated symptoms include headaches. Pertinent negatives include no chest pain, no abdominal pain and no shortness of breath. Nothing aggravates the symptoms. Nothing relieves the symptoms. She has tried nothing for the symptoms. The treatment provided significant relief.  Leg Pain Location:  Leg Injury: no   Leg location:  R upper leg Pain details:    Quality:  Burning   Radiates to:  Does not radiate   Severity:  Moderate   Onset quality:  Sudden   Duration: intermittent lasting seconds at a time.   Timing:  Intermittent   Progression:  Worsening Chronicity:  Chronic Dislocation: no   Prior injury to area:  No Relieved by:  Nothing Worsened by:  Bearing weight Ineffective treatments:  None tried Associated symptoms: no back pain, no fatigue, no fever and no neck pain     Past Medical History  Diagnosis Date  . Hypertension   . Diabetes mellitus   . Depression   . Chronic back pain   . Palpitations    Past Surgical History  Procedure Laterality Date  . Carpal tunnel release     Family History  Problem Relation Age of Onset  . Diabetes Mother   . Seizures Father   . Hypertension Father   . Asthma Other    History  Substance Use Topics  . Smoking status: Current Some Day Smoker -- 0.50 packs/day for 3 years    Types: Cigarettes    Last Attempt to Quit: 09/01/2011  . Smokeless tobacco: Not on file  . Alcohol Use: Yes     Comment: occasional   OB History   Grav Para Term  Preterm Abortions TAB SAB Ect Mult Living                 Review of Systems  Constitutional: Negative for fever and fatigue.  HENT: Negative for congestion and drooling.   Eyes: Negative for pain.  Respiratory: Negative for cough and shortness of breath.   Cardiovascular: Negative for chest pain.  Gastrointestinal: Positive for nausea and vomiting. Negative for abdominal pain and diarrhea.  Genitourinary: Negative for dysuria and hematuria.  Musculoskeletal: Negative for back pain, gait problem and neck pain.  Skin: Negative for color change.  Neurological: Positive for numbness (right thigh) and headaches. Negative for dizziness and weakness.       Fall  Hematological: Negative for adenopathy.  Psychiatric/Behavioral: Negative for behavioral problems.  All other systems reviewed and are negative.     Allergies  Penicillins  Home Medications   Prior to Admission medications   Medication Sig Start Date End Date Taking? Authorizing Provider  Azilsartan-Chlorthalidone (EDARBYCLOR) 40-12.5 MG TABS Take 1 tablet by mouth daily.   Yes Historical Provider, MD  glimepiride (AMARYL) 4 MG tablet Take 4 mg by mouth 2 (two) times daily.    Yes Historical Provider, MD  insulin aspart (NOVOLOG) 100 UNIT/ML injection Inject 1-12 Units into the skin 3 (three) times daily with meals. Sliding scale   Yes Historical Provider, MD  insulin  glargine (LANTUS) 100 UNIT/ML injection Inject 50 Units into the skin every morning.    Yes Historical Provider, MD  metFORMIN (GLUCOPHAGE) 500 MG tablet Take 500 mg by mouth 2 (two) times daily with a meal.     Yes Historical Provider, MD  nebivolol (BYSTOLIC) 10 MG tablet Take 10 mg by mouth daily.   Yes Historical Provider, MD  QUEtiapine Fumarate (SEROQUEL XR) 150 MG 24 hr tablet Take 150 mg by mouth at bedtime.     Yes Historical Provider, MD  simvastatin (ZOCOR) 10 MG tablet Take 10 mg by mouth daily at 6 PM.   Yes Historical Provider, MD   There were no  vitals taken for this visit. Physical Exam  Nursing note and vitals reviewed. Constitutional: She is oriented to person, place, and time. She appears well-developed and well-nourished.  HENT:  Head: Normocephalic.  Mouth/Throat: No oropharyngeal exudate.  Mild abrasion to the central chin.  Mild to moderate tenderness to palpation of the left ramus of the jaw.  Eyes: Conjunctivae and EOM are normal. Pupils are equal, round, and reactive to light.  Neck: Normal range of motion. Neck supple.  No vertebral tenderness to palpation noted.  Cardiovascular: Normal rate, regular rhythm, normal heart sounds and intact distal pulses.  Exam reveals no gallop and no friction rub.   No murmur heard. Pulmonary/Chest: Effort normal and breath sounds normal. No respiratory distress. She has no wheezes.  Abdominal: Soft. Bowel sounds are normal. There is no tenderness. There is no rebound and no guarding.  Musculoskeletal: Normal range of motion. She exhibits no edema and no tenderness.  Normal range of motion of bilateral hips without pain.  Neurological: She is alert and oriented to person, place, and time.  alert, oriented x3 speech: normal in context and clarity memory: intact grossly cranial nerves II-XII: intact motor strength: full proximally and distally no involuntary movements or tremors Sensation: altered sensation to light touch of the right  anterior/med/lat thigh, otherwise intact to light touch diffusely  cerebellar: finger-to-nose and heel-to-shin intact gait: antalgic gait d/t right thigh pain, compensating w/ left leg. Able to walk forwards and backwards.    Skin: Skin is warm and dry.  Psychiatric: She has a normal mood and affect. Her behavior is normal.    ED Course  Procedures (including critical care time) Labs Review Labs Reviewed  CBC WITH DIFFERENTIAL - Abnormal; Notable for the following:    HCT 34.4 (*)    Monocytes Relative 14 (*)    Eosinophils Relative 12 (*)     All other components within normal limits  COMPREHENSIVE METABOLIC PANEL - Abnormal; Notable for the following:    Sodium 132 (*)    Potassium 3.5 (*)    Chloride 90 (*)    Glucose, Bld 160 (*)    BUN 55 (*)    Creatinine, Ser 5.38 (*)    Albumin 3.3 (*)    AST 94 (*)    ALT 38 (*)    Alkaline Phosphatase 134 (*)    GFR calc non Af Amer 8 (*)    GFR calc Af Amer 10 (*)    All other components within normal limits  URINALYSIS, ROUTINE W REFLEX MICROSCOPIC - Abnormal; Notable for the following:    APPearance CLOUDY (*)    Glucose, UA 500 (*)    Hgb urine dipstick TRACE (*)    Leukocytes, UA TRACE (*)    All other components within normal limits  URINE RAPID DRUG SCREEN (HOSP  PERFORMED) - Abnormal; Notable for the following:    Cocaine POSITIVE (*)    All other components within normal limits  URINE MICROSCOPIC-ADD ON  MAGNESIUM  PHOSPHORUS  TSH  COMPREHENSIVE METABOLIC PANEL  CBC  HEMOGLOBIN A1C  CREATININE, URINE, RANDOM  SODIUM, URINE, RANDOM  HEPATITIS PANEL, ACUTE  HIV ANTIBODY (ROUTINE TESTING)  PROTIME-INR    Imaging Review Dg Hip Complete Right  01/29/2014   CLINICAL DATA:  Fall.  EXAM: RIGHT HIP - COMPLETE 2+ VIEW  COMPARISON:  None.  FINDINGS: Calcifications pelvis consistent with phleboliths. No acute bony or joint abnormality identified. No evidence of fracture or dislocation.  IMPRESSION: No acute abnormality identified.   Electronically Signed   By: Marcello Moores  Register   On: 01/29/2014 20:10   Ct Head Wo Contrast  01/29/2014   CLINICAL DATA:  Status post fall  EXAM: CT HEAD WITHOUT CONTRAST  CT MAXILLOFACIAL WITHOUT CONTRAST  TECHNIQUE: Multidetector CT imaging of the head and maxillofacial structures were performed using the standard protocol without intravenous contrast. Multiplanar CT image reconstructions of the maxillofacial structures were also generated.  COMPARISON:  10/26/2009  FINDINGS: CT HEAD FINDINGS  No acute cortical infarct, hemorrhage, or mass  lesion ispresent. Ventricles are of normal size. No significant extra-axial fluid collection is present. The paranasal sinuses andmastoid air cells are clear. The osseous skull is intact.  CT MAXILLOFACIAL FINDINGS  Comminuted fracture involves the left mandibular condyle. Mild ventral angulation of the distal fracture fragments. No dislocation. The remaining facial bones appear intact.  IMPRESSION: 1. No acute intracranial abnormalities. 2. Acute fracture of the left mandibular condyle.   Electronically Signed   By: Kerby Moors M.D.   On: 01/29/2014 18:54   Ct Maxillofacial Wo Cm  01/29/2014   CLINICAL DATA:  Status post fall  EXAM: CT HEAD WITHOUT CONTRAST  CT MAXILLOFACIAL WITHOUT CONTRAST  TECHNIQUE: Multidetector CT imaging of the head and maxillofacial structures were performed using the standard protocol without intravenous contrast. Multiplanar CT image reconstructions of the maxillofacial structures were also generated.  COMPARISON:  10/26/2009  FINDINGS: CT HEAD FINDINGS  No acute cortical infarct, hemorrhage, or mass lesion ispresent. Ventricles are of normal size. No significant extra-axial fluid collection is present. The paranasal sinuses andmastoid air cells are clear. The osseous skull is intact.  CT MAXILLOFACIAL FINDINGS  Comminuted fracture involves the left mandibular condyle. Mild ventral angulation of the distal fracture fragments. No dislocation. The remaining facial bones appear intact.  IMPRESSION: 1. No acute intracranial abnormalities. 2. Acute fracture of the left mandibular condyle.   Electronically Signed   By: Kerby Moors M.D.   On: 01/29/2014 18:54     EKG Interpretation   Date/Time:  Friday January 29 2014 17:55:49 EDT Ventricular Rate:  61 PR Interval:  158 QRS Duration: 94 QT Interval:  464 QTC Calculation: 467 R Axis:   51 Text Interpretation:  Sinus rhythm Abnormal R-wave progression, early  transition non-specific t wave changes in the lateral leads Confirmed  by  Rhayne Chatwin  MD, Bedie Dominey ([id number]) on 01/29/2014 6:02:42 PM      MDM   Final diagnoses:  Renal failure  Condylar process of mandible, closed fracture  Leg pain    5:38 PM 53 y.o. female who presents with a fall which occurred 4 days ago. She states that she was walking in her house when her legs gave out. She fell to the ground hitting her chin. She states that she felt like she lost consciousness for a few minutes.  She denies syncope. She states that she has had similar symptoms where her legs give out in the past. She notes some intermittent headaches and vomiting since that time. She currently does not have a headache. Her main complaint is of right thigh burning pain. She also has the same sensation in her left thigh but not as bad. She notes this is chronic and has been going on for greater than one year but seems to be intensified after the fall. She does have a history of diabetes. She is afebrile and vital signs are unremarkable here. She has an antalgic gait d/t the right leg pain but is otherwise neurologically intact. Will get screening labs and imaging. Suspect her leg symptoms are likely related to diabetes and poor control of her blood sugar. She notes that her blood sugar typically runs in the 4 to 500s.  Discussed mandible fx w/ Dr. Buelah Manis who will see the pt tomorrow. He recommends soft mechanical diet. Will admit to hospitalist d/t RF.     Blanchard Kelch, MD 01/29/14 2318

## 2014-01-30 ENCOUNTER — Inpatient Hospital Stay (HOSPITAL_COMMUNITY): Payer: Medicare Other

## 2014-01-30 ENCOUNTER — Other Ambulatory Visit: Payer: Self-pay

## 2014-01-30 DIAGNOSIS — N179 Acute kidney failure, unspecified: Principal | ICD-10-CM

## 2014-01-30 DIAGNOSIS — E119 Type 2 diabetes mellitus without complications: Secondary | ICD-10-CM

## 2014-01-30 DIAGNOSIS — I517 Cardiomegaly: Secondary | ICD-10-CM

## 2014-01-30 DIAGNOSIS — S02609A Fracture of mandible, unspecified, initial encounter for closed fracture: Secondary | ICD-10-CM

## 2014-01-30 DIAGNOSIS — N19 Unspecified kidney failure: Secondary | ICD-10-CM

## 2014-01-30 LAB — GLUCOSE, CAPILLARY
GLUCOSE-CAPILLARY: 218 mg/dL — AB (ref 70–99)
Glucose-Capillary: 122 mg/dL — ABNORMAL HIGH (ref 70–99)
Glucose-Capillary: 278 mg/dL — ABNORMAL HIGH (ref 70–99)
Glucose-Capillary: 234 mg/dL — ABNORMAL HIGH (ref 70–99)

## 2014-01-30 LAB — COMPREHENSIVE METABOLIC PANEL
ALBUMIN: 3.1 g/dL — AB (ref 3.5–5.2)
ALT: 40 U/L — ABNORMAL HIGH (ref 0–35)
AST: 94 U/L — AB (ref 0–37)
Alkaline Phosphatase: 122 U/L — ABNORMAL HIGH (ref 39–117)
BUN: 46 mg/dL — ABNORMAL HIGH (ref 6–23)
CALCIUM: 9.7 mg/dL (ref 8.4–10.5)
CHLORIDE: 95 meq/L — AB (ref 96–112)
CO2: 23 mEq/L (ref 19–32)
CREATININE: 4.04 mg/dL — AB (ref 0.50–1.10)
GFR calc Af Amer: 14 mL/min — ABNORMAL LOW (ref >90)
GFR, EST NON AFRICAN AMERICAN: 12 mL/min — AB (ref >90)
Glucose, Bld: 120 mg/dL — ABNORMAL HIGH (ref 70–99)
Potassium: 3.1 mEq/L — ABNORMAL LOW (ref 3.7–5.3)
Sodium: 135 mEq/L — ABNORMAL LOW (ref 137–147)
Total Bilirubin: 0.3 mg/dL (ref 0.3–1.2)
Total Protein: 7.4 g/dL (ref 6.0–8.3)

## 2014-01-30 LAB — CBC
HEMATOCRIT: 33.8 % — AB (ref 36.0–46.0)
HEMOGLOBIN: 11.5 g/dL — AB (ref 12.0–15.0)
MCH: 30.2 pg (ref 26.0–34.0)
MCHC: 34 g/dL (ref 30.0–36.0)
MCV: 88.7 fL (ref 78.0–100.0)
Platelets: 179 10*3/uL (ref 150–400)
RBC: 3.81 MIL/uL — AB (ref 3.87–5.11)
RDW: 12 % (ref 11.5–15.5)
WBC: 5.1 10*3/uL (ref 4.0–10.5)

## 2014-01-30 LAB — TSH: TSH: 1.34 u[IU]/mL (ref 0.350–4.500)

## 2014-01-30 LAB — HEMOGLOBIN A1C
Hgb A1c MFr Bld: 12 % — ABNORMAL HIGH (ref <5.7)
Mean Plasma Glucose: 298 mg/dL — ABNORMAL HIGH (ref <117)

## 2014-01-30 LAB — HEPATITIS PANEL, ACUTE
HCV AB: NEGATIVE
HEP A IGM: NONREACTIVE
HEP B S AG: NEGATIVE
Hep B C IgM: NONREACTIVE

## 2014-01-30 LAB — PHOSPHORUS: Phosphorus: 4.7 mg/dL — ABNORMAL HIGH (ref 2.3–4.6)

## 2014-01-30 LAB — HIV ANTIBODY (ROUTINE TESTING W REFLEX): HIV: NONREACTIVE

## 2014-01-30 LAB — CREATININE, URINE, RANDOM: Creatinine, Urine: 36.6 mg/dL

## 2014-01-30 LAB — PROTIME-INR
INR: 1.06 (ref 0.00–1.49)
Prothrombin Time: 13.6 seconds (ref 11.6–15.2)

## 2014-01-30 LAB — VITAMIN B12: VITAMIN B 12: 534 pg/mL (ref 211–911)

## 2014-01-30 LAB — MAGNESIUM: Magnesium: 2.4 mg/dL (ref 1.5–2.5)

## 2014-01-30 LAB — SODIUM, URINE, RANDOM: SODIUM UR: 68 meq/L

## 2014-01-30 MED ORDER — ONDANSETRON HCL 4 MG/2ML IJ SOLN
4.0000 mg | Freq: Four times a day (QID) | INTRAMUSCULAR | Status: DC | PRN
  Administered 2014-01-30 – 2014-02-01 (×6): 4 mg via INTRAVENOUS
  Filled 2014-01-30 (×6): qty 2

## 2014-01-30 MED ORDER — SODIUM CHLORIDE 0.9 % IV SOLN
INTRAVENOUS | Status: DC
  Administered 2014-01-30: 100 mL/h via INTRAVENOUS
  Administered 2014-01-31: 09:00:00 via INTRAVENOUS

## 2014-01-30 MED ORDER — SODIUM CHLORIDE 0.9 % IV BOLUS (SEPSIS)
500.0000 mL | Freq: Once | INTRAVENOUS | Status: AC
Start: 2014-01-30 — End: 2014-01-30
  Administered 2014-01-30: 500 mL via INTRAVENOUS

## 2014-01-30 MED ORDER — OXYCODONE-ACETAMINOPHEN 5-325 MG PO TABS
1.0000 | ORAL_TABLET | Freq: Once | ORAL | Status: AC
  Administered 2014-01-30: 1 via ORAL
  Filled 2014-01-30: qty 1

## 2014-01-30 MED ORDER — POTASSIUM CHLORIDE CRYS ER 20 MEQ PO TBCR
20.0000 meq | EXTENDED_RELEASE_TABLET | Freq: Once | ORAL | Status: AC
  Administered 2014-01-30: 20 meq via ORAL
  Filled 2014-01-30: qty 1

## 2014-01-30 MED ORDER — CALCIUM CARBONATE ANTACID 500 MG PO CHEW
1.0000 | CHEWABLE_TABLET | Freq: Once | ORAL | Status: AC
Start: 2014-01-30 — End: 2014-01-30
  Administered 2014-01-30: 200 mg via ORAL
  Filled 2014-01-30: qty 1

## 2014-01-30 NOTE — Progress Notes (Signed)
cbgis 224 but had just eaten some ice cream and another snack prior

## 2014-01-30 NOTE — Consult Note (Signed)
Reason for Consult: Left Condylar Fracture of the Mandible Referring Physician: Dr. Pamella Pert  Carol [name] is an 53 y.o. female.  HPI: The patient reports that she had a syncopal event and had a ground level fall while standing up to answer the door at home.  She currently is admitted to medicine for a Syncope work up.    PMHx:  Past Medical History  Diagnosis Date  . Hypertension   . Diabetes mellitus   . Depression   . Chronic back pain   . Palpitations     PSx:  Past Surgical History  Procedure Laterality Date  . Carpal tunnel release      Family Hx:  Family History  Problem Relation Age of Onset  . Diabetes Mother   . Kidney disease Mother   . Seizures Father   . Hypertension Father   . Stroke Father   . Asthma Other     Social Hx:  reports that she has been smoking Cigarettes.  She has a 1.5 pack-year smoking history. She does not have any smokeless tobacco history on file. She reports that she drinks alcohol. She reports that she does not use illicit drugs.  Allergies:  Allergies  Allergen Reactions  . Penicillins     Really bad yeast infection    Medications: I have reviewed the patient's current medications.  Labs:  Results for orders placed during the hospital encounter of 01/29/14 (from the past 48 hour(s))  CBC WITH DIFFERENTIAL     Status: Abnormal   Collection Time    01/29/14  5:17 PM      Result Value Ref Range   WBC 6.1  4.0 - 10.5 K/uL   RBC 3.92  3.87 - 5.11 MIL/uL   Hemoglobin 12.0  12.0 - 15.0 g/dL   HCT 34.4 (*) 36.0 - 46.0 %   MCV 87.8  78.0 - 100.0 fL   MCH 30.6  26.0 - 34.0 pg   MCHC 34.9  30.0 - 36.0 g/dL   RDW 12.0  11.5 - 15.5 %   Platelets 187  150 - 400 K/uL   Neutrophils Relative % 50  43 - 77 %   Neutro Abs 3.0  1.7 - 7.7 K/uL   Lymphocytes Relative 23  12 - 46 %   Lymphs Abs 1.4  0.7 - 4.0 K/uL   Monocytes Relative 14 (*) 3 - 12 %   Monocytes Absolute 0.9  0.1 - 1.0 K/uL   Eosinophils Relative 12 (*) 0 - 5 %    Eosinophils Absolute 0.7  0.0 - 0.7 K/uL   Basophils Relative 1  0 - 1 %   Basophils Absolute 0.1  0.0 - 0.1 K/uL  COMPREHENSIVE METABOLIC PANEL     Status: Abnormal   Collection Time    01/29/14  5:17 PM      Result Value Ref Range   Sodium 132 (*) 137 - 147 mEq/L   Potassium 3.5 (*) 3.7 - 5.3 mEq/L   Chloride 90 (*) 96 - 112 mEq/L   CO2 24  19 - 32 mEq/L   Glucose, Bld 160 (*) 70 - 99 mg/dL   BUN 55 (*) 6 - 23 mg/dL   Creatinine, Ser 5.38 (*) 0.50 - 1.10 mg/dL   Calcium 9.8  8.4 - 10.5 mg/dL   Total Protein 8.2  6.0 - 8.3 g/dL   Albumin 3.3 (*) 3.5 - 5.2 g/dL   AST 94 (*) 0 - 37 U/L   ALT 38 (*)  0 - 35 U/L   Alkaline Phosphatase 134 (*) 39 - 117 U/L   Total Bilirubin 0.3  0.3 - 1.2 mg/dL   GFR calc non Af Amer 8 (*) >90 mL/min   GFR calc Af Amer 10 (*) >90 mL/min   Comment: (NOTE)     The eGFR has been calculated using the CKD EPI equation.     This calculation has not been validated in all clinical situations.     eGFR's persistently <90 mL/min signify possible Chronic Kidney     Disease.  URINALYSIS, ROUTINE W REFLEX MICROSCOPIC     Status: Abnormal   Collection Time    01/29/14  9:16 PM      Result Value Ref Range   Color, Urine YELLOW  YELLOW   APPearance CLOUDY (*) CLEAR   Specific Gravity, Urine 1.009  1.005 - 1.030   pH 6.0  5.0 - 8.0   Glucose, UA 500 (*) NEGATIVE mg/dL   Hgb urine dipstick TRACE (*) NEGATIVE   Bilirubin Urine NEGATIVE  NEGATIVE   Ketones, ur NEGATIVE  NEGATIVE mg/dL   Protein, ur NEGATIVE  NEGATIVE mg/dL   Urobilinogen, UA 0.2  0.0 - 1.0 mg/dL   Nitrite NEGATIVE  NEGATIVE   Leukocytes, UA TRACE (*) NEGATIVE  URINE RAPID DRUG SCREEN (HOSP PERFORMED)     Status: Abnormal   Collection Time    01/29/14  9:16 PM      Result Value Ref Range   Opiates NONE DETECTED  NONE DETECTED   Cocaine POSITIVE (*) NONE DETECTED   Benzodiazepines NONE DETECTED  NONE DETECTED   Amphetamines NONE DETECTED  NONE DETECTED   Tetrahydrocannabinol NONE DETECTED   NONE DETECTED   Barbiturates NONE DETECTED  NONE DETECTED   Comment:            DRUG SCREEN FOR MEDICAL PURPOSES     ONLY.  IF CONFIRMATION IS NEEDED     FOR ANY PURPOSE, NOTIFY LAB     WITHIN 5 DAYS.                LOWEST DETECTABLE LIMITS     FOR URINE DRUG SCREEN     Drug Class       Cutoff (ng/mL)     Amphetamine      1000     Barbiturate      200     Benzodiazepine   865     Tricyclics       784     Opiates          300     Cocaine          300     THC              50  URINE MICROSCOPIC-ADD ON     Status: None   Collection Time    01/29/14  9:16 PM      Result Value Ref Range   Squamous Epithelial / LPF RARE  RARE   WBC, UA 3-6  <3 WBC/hpf   RBC / HPF 0-2  <3 RBC/hpf   Bacteria, UA RARE  RARE  CREATININE, URINE, RANDOM     Status: None   Collection Time    01/29/14  9:53 PM      Result Value Ref Range   Creatinine, Urine 36.6     Comment: Performed at [facility]  SODIUM, URINE, RANDOM     Status: None   Collection Time  01/29/14  9:53 PM      Result Value Ref Range   Sodium, Ur 68     Comment: Performed at [place], CAPILLARY     Status: Abnormal   Collection Time    01/29/14 10:26 PM      Result Value Ref Range   Glucose-Capillary 243 (*) 70 - 99 mg/dL  MAGNESIUM     Status: None   Collection Time    01/30/14  4:22 AM      Result Value Ref Range   Magnesium 2.4  1.5 - 2.5 mg/dL  PHOSPHORUS     Status: Abnormal   Collection Time    01/30/14  4:22 AM      Result Value Ref Range   Phosphorus 4.7 (*) 2.3 - 4.6 mg/dL  TSH     Status: None   Collection Time    01/30/14  4:22 AM      Result Value Ref Range   TSH 1.340  0.350 - 4.500 uIU/mL   Comment: Performed at [place] PANEL     Status: Abnormal   Collection Time    01/30/14  4:22 AM      Result Value Ref Range   Sodium 135 (*) 137 - 147 mEq/L   Potassium 3.1 (*) 3.7 - 5.3 mEq/L   Chloride 95 (*) 96 - 112 mEq/L   CO2 23  19 - 32  mEq/L   Glucose, Bld 120 (*) 70 - 99 mg/dL   BUN 46 (*) 6 - 23 mg/dL   Creatinine, Ser 4.04 (*) 0.50 - 1.10 mg/dL   Calcium 9.7  8.4 - 10.5 mg/dL   Total Protein 7.4  6.0 - 8.3 g/dL   Albumin 3.1 (*) 3.5 - 5.2 g/dL   AST 94 (*) 0 - 37 U/L   ALT 40 (*) 0 - 35 U/L   Alkaline Phosphatase 122 (*) 39 - 117 U/L   Total Bilirubin 0.3  0.3 - 1.2 mg/dL   GFR calc non Af Amer 12 (*) >90 mL/min   GFR calc Af Amer 14 (*) >90 mL/min   Comment: (NOTE)     The eGFR has been calculated using the CKD EPI equation.     This calculation has not been validated in all clinical situations.     eGFR's persistently <90 mL/min signify possible Chronic Kidney     Disease.  CBC     Status: Abnormal   Collection Time    01/30/14  4:22 AM      Result Value Ref Range   WBC 5.1  4.0 - 10.5 K/uL   RBC 3.81 (*) 3.87 - 5.11 MIL/uL   Hemoglobin 11.5 (*) 12.0 - 15.0 g/dL   HCT 33.8 (*) 36.0 - 46.0 %   MCV 88.7  78.0 - 100.0 fL   MCH 30.2  26.0 - 34.0 pg   MCHC 34.0  30.0 - 36.0 g/dL   RDW 12.0  11.5 - 15.5 %   Platelets 179  150 - 400 K/uL  PROTIME-INR     Status: None   Collection Time    01/30/14  4:22 AM      Result Value Ref Range   Prothrombin Time 13.6  11.6 - 15.2 seconds   INR 1.06  0.00 - 1.49  GLUCOSE, CAPILLARY     Status: Abnormal   Collection Time    01/30/14  7:44 AM      Result Value Ref Range  Glucose-Capillary 122 (*) 70 - 99 mg/dL   Comment 1 Notify RN     Comment 2 Documented in Chart      Radiology: Dg Lumbar Spine Complete  01/30/2014   CLINICAL DATA:  Back pain after fall.  Right leg pain.  EXAM: LUMBAR SPINE - COMPLETE 4+ VIEW  COMPARISON:  01/04/2009  FINDINGS: There is no evidence of lumbar spine fracture. Alignment is normal. Intervertebral disc spaces are maintained.  There is a 15 mm ill-defined calcification in the right upper quadrant, compatible with cholelithiasis. Abdominal ultrasound is pending. Small calcifications right lateral of the lower lumbar spine, likely  incidental based on the history. These could be ovarian vein phleboliths. Question aortic atherosclerosis.  IMPRESSION: 1. No acute osseous findings or significant degenerative change. 2. Cholelithiasis.   Electronically Signed   By: Jorje Guild M.D.   On: 01/30/2014 08:46   Dg Hip Complete Right  01/29/2014   CLINICAL DATA:  Fall.  EXAM: RIGHT HIP - COMPLETE 2+ VIEW  COMPARISON:  None.  FINDINGS: Calcifications pelvis consistent with phleboliths. No acute bony or joint abnormality identified. No evidence of fracture or dislocation.  IMPRESSION: No acute abnormality identified.   Electronically Signed   By: Marcello Moores  Register   On: 01/29/2014 20:10   Ct Head Wo Contrast  01/29/2014   CLINICAL DATA:  Status post fall  EXAM: CT HEAD WITHOUT CONTRAST  CT MAXILLOFACIAL WITHOUT CONTRAST  TECHNIQUE: Multidetector CT imaging of the head and maxillofacial structures were performed using the standard protocol without intravenous contrast. Multiplanar CT image reconstructions of the maxillofacial structures were also generated.  COMPARISON:  10/26/2009  FINDINGS: CT HEAD FINDINGS  No acute cortical infarct, hemorrhage, or mass lesion ispresent. Ventricles are of normal size. No significant extra-axial fluid collection is present. The paranasal sinuses andmastoid air cells are clear. The osseous skull is intact.  CT MAXILLOFACIAL FINDINGS  Comminuted fracture involves the left mandibular condyle. Mild ventral angulation of the distal fracture fragments. No dislocation. The remaining facial bones appear intact.  IMPRESSION: 1. No acute intracranial abnormalities. 2. Acute fracture of the left mandibular condyle.   Electronically Signed   By: Kerby Moors M.D.   On: 01/29/2014 18:54   US Abdomen Complete  01/30/2014   CLINICAL DATA:  Evaluate liver and gallbladder. Elevated liver function tests.  EXAM: ULTRASOUND ABDOMEN COMPLETE  COMPARISON:  None.  FINDINGS: Gallbladder:  At least 1 large, mobile gallstone is  present. No wall thickening or sonographic Murphy sign.  Common bile duct:  Diameter: 6 mm.  Were visualized, no filling defect.  Liver:  No focal lesion. Antegrade flow in the imaged hepatic and portal venous system. No definitive fatty infiltration.  IVC:  No abnormality visualized.  Pancreas:  The duct in the body is visible, but not dilated at 2 mm. There is no evidence of parenchymal calcification.  Spleen:  Size and appearance within normal limits.  Right Kidney:  Length: 12 cm. Echogenicity within normal limits. No mass or hydronephrosis visualized.  Left Kidney:  Length: 12 cm. Echogenicity within normal limits. No mass or hydronephrosis visualized.  Abdominal aorta:  The aortic bifurcation is not visualized. Otherwise, no evidence of aneurysm.  Other findings:  None.  IMPRESSION: Cholelithiasis without acute cholecystitis.   Electronically Signed   By: Jorje Guild M.D.   On: 01/30/2014 09:04   Ct Maxillofacial Wo Cm  01/29/2014   CLINICAL DATA:  Status post fall  EXAM: CT HEAD WITHOUT CONTRAST  CT MAXILLOFACIAL WITHOUT  CONTRAST  TECHNIQUE: Multidetector CT imaging of the head and maxillofacial structures were performed using the standard protocol without intravenous contrast. Multiplanar CT image reconstructions of the maxillofacial structures were also generated.  COMPARISON:  10/26/2009  FINDINGS: CT HEAD FINDINGS  No acute cortical infarct, hemorrhage, or mass lesion ispresent. Ventricles are of normal size. No significant extra-axial fluid collection is present. The paranasal sinuses andmastoid air cells are clear. The osseous skull is intact.  CT MAXILLOFACIAL FINDINGS  Comminuted fracture involves the left mandibular condyle. Mild ventral angulation of the distal fracture fragments. No dislocation. The remaining facial bones appear intact.  IMPRESSION: 1. No acute intracranial abnormalities. 2. Acute fracture of the left mandibular condyle.   Electronically Signed   By: Kerby Moors M.D.    On: 01/29/2014 18:54    GBE:EFEOFHQRF items are noted in HPI.  Vital Signs: BP 99/58  Pulse 54  Temp(Src) 97.7 F (36.5 C) (Axillary)  Resp 19  Ht '5\' 4"'  (1.626 m)  Wt 85.276 kg (188 lb)  BMI 32.25 kg/m2  SpO2 97%  Physical Exam: General appearance: alert and cooperative Head: Normocephalic, without obvious abnormality, atraumatic Eyes: conjunctivae/corneas clear. PERRL, EOM's intact. Fundi benign. Ears: External canals normal, no ringing in the ears Nose: Nares normal. Septum midline. Mucosa normal. No drainage or sinus tenderness. Throat: abnormal findings: The patient has a pre-injury malocclusion (class III  with crossbite).  The patient reports that her crossbite has shifted more to the left. She has pain at the left TMJ and mild swelling.  Her maximum opening is ~40 mm.  Assessment/Plan: The patient has a left condyle fracture of the mandible.  1. Due to the risk of ankylosis of the mandible to the temporal bone we will not perform a closed reduction. 2. The fractured segment is too small to fixate 3.  The fracture is non-operative.  I recommend jaw movement over the next  6 to 8 weeks to avoid ankylosis.  The patient should have a soft or pureed diet but should have continual jaw movement and stretching which I discussed with the patient.   4. I recommended that the patient have orthodontics to correct her class III occlusion.  I also explained that the patient may have TMJ pain later in life.  5. Follow up in one to two weeks after discharge.  Office number [phone]  [place],Keneth Borg L  01/30/2014, 10:21 AM

## 2014-01-30 NOTE — Progress Notes (Signed)
Of note pt is noted to be eating frequently - mostly foods with high sugar/carb contents like potato chips, danish c icing, ice cream etc.  Her alc is 12.  Diabetic diet and rationale reinforced and pt and husband verbalize understanding.  Will continue to educate

## 2014-01-30 NOTE — Plan of Care (Signed)
Problem: Problem: Diabetes Management Progression Goal: INCREASE DIABETES KNOWLEDGE Outcome: Not Met (add Reason) Reinforcement of diabetic diet and rationale - pt noted to be eating large amounts of high carb foods.   A1c is 12.  Pt and family verbalize understanding but will continue to need reinforcedment

## 2014-01-30 NOTE — Progress Notes (Addendum)
[facility] PROGRESS NOTE  Venetta Perkett [id number] DOB: 1961-04-20 DOA: 01/29/2014 PCP: Philis Fendt, MD  Assessment/Plan: Acute renal failure: In setting poor oral intake, diuretics. Cr peak at 5.3. It has decreased to 4.0. Continue with IV fluids. Check UPEP, SPEP.   Diabetes mellitus: CBG in the 120 range. Will hold lantus, patient with probably poor oral intake. SSI.   Closed jaw fracture - ENT is aware recommend mechanical diet to see in the morning.  Dr. Buelah Manis   Recent syncope; probably related to hypotension. Holding BP medications.   Lower extremity Paresthesia worse right -will write for Neurontin. X ray:  No acute osseous findings or significant degenerative change. Will order MRI to better evaluate. Check B-12.   Prolonged QTC; Monitor on Seroquel. Repeat EKG.   Transaminases; stable. [place]; Cholelithiasis without acute cholecystitis. No focal lesion. Antegrade flow in the imaged hepatic and portal venous system. No definitive fatty infiltration.. Hepatitis viral panel and HIV pending.          Code Status: Full Code.  Family Communication:  Disposition Plan: Remain inpatient.    Consultants:  ENT  Procedures:  ECHO;  [place];  Cholelithiasis without acute cholecystitis. No focal lesion. Antegrade flow in the imaged hepatic and portal venous system. No definitive fatty infiltration.  Antibiotics:  none  HPI/Subjective: She has been vomiting for last few days. She had some abdominal pain , lower quadrant on and off for months, last time she had abdominal pain was 2 days ago. She relates numbness LE bilaterally for months, worse since the fall.   Objective: Filed Vitals:   01/30/14 0738  BP: 99/58  Pulse: 54  Temp:   Resp:     Intake/Output Summary (Last 24 hours) at 01/30/14 0907 Last data filed at 01/30/14 0700  Gross per 24 hour  Intake    818 ml  Output      0 ml  Net    818 ml   Filed Weights   01/29/14 2137  Weight: 85.276 kg  (188 lb)    Exam:   General:  No distress.   Cardiovascular: S 1, S 2 RRR  Respiratory: CTA  Abdomen: BS present, soft, NT  Musculoskeletal: no edema  Neuro exam, right LE mild weakness.   Data Reviewed: Basic Metabolic Panel:  Recent Labs Lab 01/29/14 1717 01/30/14 0422  NA 132* 135*  K 3.5* 3.1*  CL 90* 95*  CO2 24 23  GLUCOSE 160* 120*  BUN 55* 46*  CREATININE 5.38* 4.04*  CALCIUM 9.8 9.7  MG  --  2.4  PHOS  --  4.7*   Liver Function Tests:  Recent Labs Lab 01/29/14 1717 01/30/14 0422  AST 94* 94*  ALT 38* 40*  ALKPHOS 134* 122*  BILITOT 0.3 0.3  PROT 8.2 7.4  ALBUMIN 3.3* 3.1*   No results found for this basename: LIPASE, AMYLASE,  in the last 168 hours No results found for this basename: AMMONIA,  in the last 168 hours CBC:  Recent Labs Lab 01/29/14 1717 01/30/14 0422  WBC 6.1 5.1  NEUTROABS 3.0  --   HGB 12.0 11.5*  HCT 34.4* 33.8*  MCV 87.8 88.7  PLT 187 179   Cardiac Enzymes: No results found for this basename: CKTOTAL, CKMB, CKMBINDEX, TROPONINI,  in the last 168 hours BNP (last 3 results) No results found for this basename: PROBNP,  in the last 8760 hours CBG:  Recent Labs Lab 01/29/14 2226 01/30/14 0744  GLUCAP 243* 122*    No  results found for this or any previous visit (from the past 240 hour(s)).   Studies: Dg Lumbar Spine Complete  01/30/2014   CLINICAL DATA:  Back pain after fall.  Right leg pain.  EXAM: LUMBAR SPINE - COMPLETE 4+ VIEW  COMPARISON:  01/04/2009  FINDINGS: There is no evidence of lumbar spine fracture. Alignment is normal. Intervertebral disc spaces are maintained.  There is a 15 mm ill-defined calcification in the right upper quadrant, compatible with cholelithiasis. Abdominal ultrasound is pending. Small calcifications right lateral of the lower lumbar spine, likely incidental based on the history. These could be ovarian vein phleboliths. Question aortic atherosclerosis.  IMPRESSION: 1. No acute osseous  findings or significant degenerative change. 2. Cholelithiasis.   Electronically Signed   By: Jorje Guild M.D.   On: 01/30/2014 08:46   Dg Hip Complete Right  01/29/2014   CLINICAL DATA:  Fall.  EXAM: RIGHT HIP - COMPLETE 2+ VIEW  COMPARISON:  None.  FINDINGS: Calcifications pelvis consistent with phleboliths. No acute bony or joint abnormality identified. No evidence of fracture or dislocation.  IMPRESSION: No acute abnormality identified.   Electronically Signed   By: Marcello Moores  Register   On: 01/29/2014 20:10   Ct Head Wo Contrast  01/29/2014   CLINICAL DATA:  Status post fall  EXAM: CT HEAD WITHOUT CONTRAST  CT MAXILLOFACIAL WITHOUT CONTRAST  TECHNIQUE: Multidetector CT imaging of the head and maxillofacial structures were performed using the standard protocol without intravenous contrast. Multiplanar CT image reconstructions of the maxillofacial structures were also generated.  COMPARISON:  10/26/2009  FINDINGS: CT HEAD FINDINGS  No acute cortical infarct, hemorrhage, or mass lesion ispresent. Ventricles are of normal size. No significant extra-axial fluid collection is present. The paranasal sinuses andmastoid air cells are clear. The osseous skull is intact.  CT MAXILLOFACIAL FINDINGS  Comminuted fracture involves the left mandibular condyle. Mild ventral angulation of the distal fracture fragments. No dislocation. The remaining facial bones appear intact.  IMPRESSION: 1. No acute intracranial abnormalities. 2. Acute fracture of the left mandibular condyle.   Electronically Signed   By: Kerby Moors M.D.   On: 01/29/2014 18:54   US Abdomen Complete  01/30/2014   CLINICAL DATA:  Evaluate liver and gallbladder. Elevated liver function tests.  EXAM: ULTRASOUND ABDOMEN COMPLETE  COMPARISON:  None.  FINDINGS: Gallbladder:  At least 1 large, mobile gallstone is present. No wall thickening or sonographic Murphy sign.  Common bile duct:  Diameter: 6 mm.  Were visualized, no filling defect.  Liver:  No  focal lesion. Antegrade flow in the imaged hepatic and portal venous system. No definitive fatty infiltration.  IVC:  No abnormality visualized.  Pancreas:  The duct in the body is visible, but not dilated at 2 mm. There is no evidence of parenchymal calcification.  Spleen:  Size and appearance within normal limits.  Right Kidney:  Length: 12 cm. Echogenicity within normal limits. No mass or hydronephrosis visualized.  Left Kidney:  Length: 12 cm. Echogenicity within normal limits. No mass or hydronephrosis visualized.  Abdominal aorta:  The aortic bifurcation is not visualized. Otherwise, no evidence of aneurysm.  Other findings:  None.  IMPRESSION: Cholelithiasis without acute cholecystitis.   Electronically Signed   By: Jorje Guild M.D.   On: 01/30/2014 09:04   Ct Maxillofacial Wo Cm  01/29/2014   CLINICAL DATA:  Status post fall  EXAM: CT HEAD WITHOUT CONTRAST  CT MAXILLOFACIAL WITHOUT CONTRAST  TECHNIQUE: Multidetector CT imaging of the head and maxillofacial structures  were performed using the standard protocol without intravenous contrast. Multiplanar CT image reconstructions of the maxillofacial structures were also generated.  COMPARISON:  10/26/2009  FINDINGS: CT HEAD FINDINGS  No acute cortical infarct, hemorrhage, or mass lesion ispresent. Ventricles are of normal size. No significant extra-axial fluid collection is present. The paranasal sinuses andmastoid air cells are clear. The osseous skull is intact.  CT MAXILLOFACIAL FINDINGS  Comminuted fracture involves the left mandibular condyle. Mild ventral angulation of the distal fracture fragments. No dislocation. The remaining facial bones appear intact.  IMPRESSION: 1. No acute intracranial abnormalities. 2. Acute fracture of the left mandibular condyle.   Electronically Signed   By: Kerby Moors M.D.   On: 01/29/2014 18:54    Scheduled Meds: . docusate sodium  100 mg Oral BID  . gabapentin  100 mg Oral QHS  . insulin aspart  0-5 Units  Subcutaneous QHS  . insulin aspart  0-9 Units Subcutaneous TID WC  . insulin glargine  50 Units Subcutaneous Daily  . pantoprazole  40 mg Oral Q1200  . QUEtiapine Fumarate  150 mg Oral QHS  . sodium chloride  3 mL Intravenous Q12H   Continuous Infusions:   Active Problems:   Acute renal failure   Diabetes mellitus   Hypertension   Closed jaw fracture   Paresthesia   Syncope   Elevated LFTs    Time spent: 35 minutes.     Niel Hummer A  [facility] Pager [phone]. If 7PM-7AM, please contact night-coverage at [email or web address], password [facility] 01/30/2014, 9:07 AM  LOS: 1 day

## 2014-01-30 NOTE — Progress Notes (Signed)
Echocardiogram 2D Echocardiogram has been performed.  Doyle Askew 01/30/2014, 11:52 AM

## 2014-01-30 NOTE — Consult Note (Signed)
Carol [name] is an 53 y.o. female referred by Dr Tyrell Antonio   Chief Complaint: AKI HPI: 53yo BF admitted last night after presenting to ER with lightheadedness and orthostatic Sxs.  The orthostasis has been going on for the past month, ever since she was started an ARB/diuretic BP med.  She denies any hx CKD, gross hematuria, stones, or use of NSAID's.  She has had Dm for 20 yrs complicated bt neuropathy and HTN for at least 48yrs.  Her BP was in the 90's on presentation yest.  She fell last Mon and fx her jaw and thus PO intake has been decreased.  Renal US unremarkable.  Past Medical History  Diagnosis Date  . Hypertension   . Diabetes mellitus   . Depression   . Chronic back pain   . Palpitations     Past Surgical History  Procedure Laterality Date  . Carpal tunnel release      Family History  Problem Relation Age of Onset  . Diabetes Mother   . Kidney disease Mother   . Seizures Father   . Hypertension Father   . Stroke Father   . Asthma Other   Hx ESRD in Mother sec DM  Social History:  reports that she has been smoking Cigarettes.  She has a 1.5 pack-year smoking history. She does not have any smokeless tobacco history on file. She reports that she drinks alcohol. She reports that she does not use illicit drugs. Engaged.  3 children from previous marriage.  On disability  Allergies:  Allergies  Allergen Reactions  . Penicillins     Really bad yeast infection    Medications Prior to Admission  Medication Sig Dispense Refill  . Azilsartan-Chlorthalidone (EDARBYCLOR) 40-12.5 MG TABS Take 1 tablet by mouth daily.      Marland Kitchen glimepiride (AMARYL) 4 MG tablet Take 4 mg by mouth 2 (two) times daily.       . insulin aspart (NOVOLOG) 100 UNIT/ML injection Inject 1-12 Units into the skin 3 (three) times daily with meals. Sliding scale      . insulin glargine (LANTUS) 100 UNIT/ML injection Inject 50 Units into the skin every morning.       . metFORMIN (GLUCOPHAGE) 500 MG tablet  Take 500 mg by mouth 2 (two) times daily with a meal.        . nebivolol (BYSTOLIC) 10 MG tablet Take 10 mg by mouth daily.      . QUEtiapine Fumarate (SEROQUEL XR) 150 MG 24 hr tablet Take 150 mg by mouth at bedtime.        . simvastatin (ZOCOR) 10 MG tablet Take 10 mg by mouth daily at 6 PM.         Lab Results: UA: no protein, 3-6 wbc, 0-2 rbc   Recent Labs  01/29/14 1717 01/30/14 0422  WBC 6.1 5.1  HGB 12.0 11.5*  HCT 34.4* 33.8*  PLT 187 179   BMET  Recent Labs  01/29/14 1717 01/30/14 0422  NA 132* 135*  K 3.5* 3.1*  CL 90* 95*  CO2 24 23  GLUCOSE 160* 120*  BUN 55* 46*  CREATININE 5.38* 4.04*  CALCIUM 9.8 9.7  PHOS  --  4.7*   LFT  Recent Labs  01/30/14 0422  PROT 7.4  ALBUMIN 3.1*  AST 94*  ALT 40*  ALKPHOS 122*  BILITOT 0.3   Dg Lumbar Spine Complete  01/30/2014   CLINICAL DATA:  Back pain after fall.  Right leg pain.  EXAM:  LUMBAR SPINE - COMPLETE 4+ VIEW  COMPARISON:  01/04/2009  FINDINGS: There is no evidence of lumbar spine fracture. Alignment is normal. Intervertebral disc spaces are maintained.  There is a 15 mm ill-defined calcification in the right upper quadrant, compatible with cholelithiasis. Abdominal ultrasound is pending. Small calcifications right lateral of the lower lumbar spine, likely incidental based on the history. These could be ovarian vein phleboliths. Question aortic atherosclerosis.  IMPRESSION: 1. No acute osseous findings or significant degenerative change. 2. Cholelithiasis.   Electronically Signed   By: Jorje Guild M.D.   On: 01/30/2014 08:46   Dg Hip Complete Right  01/29/2014   CLINICAL DATA:  Fall.  EXAM: RIGHT HIP - COMPLETE 2+ VIEW  COMPARISON:  None.  FINDINGS: Calcifications pelvis consistent with phleboliths. No acute bony or joint abnormality identified. No evidence of fracture or dislocation.  IMPRESSION: No acute abnormality identified.   Electronically Signed   By: Marcello Moores  Register   On: 01/29/2014 20:10   Ct  Head Wo Contrast  01/29/2014   CLINICAL DATA:  Status post fall  EXAM: CT HEAD WITHOUT CONTRAST  CT MAXILLOFACIAL WITHOUT CONTRAST  TECHNIQUE: Multidetector CT imaging of the head and maxillofacial structures were performed using the standard protocol without intravenous contrast. Multiplanar CT image reconstructions of the maxillofacial structures were also generated.  COMPARISON:  10/26/2009  FINDINGS: CT HEAD FINDINGS  No acute cortical infarct, hemorrhage, or mass lesion ispresent. Ventricles are of normal size. No significant extra-axial fluid collection is present. The paranasal sinuses andmastoid air cells are clear. The osseous skull is intact.  CT MAXILLOFACIAL FINDINGS  Comminuted fracture involves the left mandibular condyle. Mild ventral angulation of the distal fracture fragments. No dislocation. The remaining facial bones appear intact.  IMPRESSION: 1. No acute intracranial abnormalities. 2. Acute fracture of the left mandibular condyle.   Electronically Signed   By: Kerby Moors M.D.   On: 01/29/2014 18:54   US Abdomen Complete  01/30/2014   CLINICAL DATA:  Evaluate liver and gallbladder. Elevated liver function tests.  EXAM: ULTRASOUND ABDOMEN COMPLETE  COMPARISON:  None.  FINDINGS: Gallbladder:  At least 1 large, mobile gallstone is present. No wall thickening or sonographic Murphy sign.  Common bile duct:  Diameter: 6 mm.  Were visualized, no filling defect.  Liver:  No focal lesion. Antegrade flow in the imaged hepatic and portal venous system. No definitive fatty infiltration.  IVC:  No abnormality visualized.  Pancreas:  The duct in the body is visible, but not dilated at 2 mm. There is no evidence of parenchymal calcification.  Spleen:  Size and appearance within normal limits.  Right Kidney:  Length: 12 cm. Echogenicity within normal limits. No mass or hydronephrosis visualized.  Left Kidney:  Length: 12 cm. Echogenicity within normal limits. No mass or hydronephrosis visualized.   Abdominal aorta:  The aortic bifurcation is not visualized. Otherwise, no evidence of aneurysm.  Other findings:  None.  IMPRESSION: Cholelithiasis without acute cholecystitis.   Electronically Signed   By: Jorje Guild M.D.   On: 01/30/2014 09:04   Ct Maxillofacial Wo Cm  01/29/2014   CLINICAL DATA:  Status post fall  EXAM: CT HEAD WITHOUT CONTRAST  CT MAXILLOFACIAL WITHOUT CONTRAST  TECHNIQUE: Multidetector CT imaging of the head and maxillofacial structures were performed using the standard protocol without intravenous contrast. Multiplanar CT image reconstructions of the maxillofacial structures were also generated.  COMPARISON:  10/26/2009  FINDINGS: CT HEAD FINDINGS  No acute cortical infarct, hemorrhage, or mass  lesion ispresent. Ventricles are of normal size. No significant extra-axial fluid collection is present. The paranasal sinuses andmastoid air cells are clear. The osseous skull is intact.  CT MAXILLOFACIAL FINDINGS  Comminuted fracture involves the left mandibular condyle. Mild ventral angulation of the distal fracture fragments. No dislocation. The remaining facial bones appear intact.  IMPRESSION: 1. No acute intracranial abnormalities. 2. Acute fracture of the left mandibular condyle.   Electronically Signed   By: Kerby Moors M.D.   On: 01/29/2014 18:54    ROS: Some decrease in visual acuity No SOB No CP No abd pain CO shooting pain Rt hip to knee Chronic numbness of hands and feet No dysuria  PHYSICAL EXAM: Blood pressure 99/58, pulse 54, temperature 97.7 F (36.5 C), temperature source Axillary, resp. rate 19, height 5\' 4"  (1.626 m), weight 85.276 kg (188 lb), SpO2 97.00%. HEENT: PERRLA EOMI NECK:no JVD LUNGS:clear CARDIAC:RRR wo MRG ABD:+ bs soft mild superficial tenderness  No HSM  No bruits EXT:no edema NEURO:CNI Ox3 no asterixis  Assessment: 1. AKI  Sec to volume depletion, hypotension while on ARB.  Given her hx DM/HTN suspect she also has a component of CKD.    A drop in BP and rise in Scr raises issue of RAS but US shows nl and equal size kidneys and I hear no abd bruits and thus would hold off on any evaluation at this time 2. HTN though now sl hypotensive 3. DM 4. Bipolar Ds PLAN: 1. Cont IV hydration 2. Cont off ARB 3. Scr is lower and hopefully will cont to improve 4. Daily Scr   Artesha Wemhoff T 01/30/2014, 11:33 AM

## 2014-01-31 DIAGNOSIS — R7989 Other specified abnormal findings of blood chemistry: Secondary | ICD-10-CM

## 2014-01-31 LAB — RENAL FUNCTION PANEL
ALBUMIN: 2.8 g/dL — AB (ref 3.5–5.2)
BUN: 27 mg/dL — ABNORMAL HIGH (ref 6–23)
CALCIUM: 9.2 mg/dL (ref 8.4–10.5)
CO2: 22 meq/L (ref 19–32)
CREATININE: 2.5 mg/dL — AB (ref 0.50–1.10)
Chloride: 97 mEq/L (ref 96–112)
GFR calc Af Amer: 25 mL/min — ABNORMAL LOW (ref >90)
GFR calc non Af Amer: 21 mL/min — ABNORMAL LOW (ref >90)
Glucose, Bld: 333 mg/dL — ABNORMAL HIGH (ref 70–99)
Phosphorus: 3.3 mg/dL (ref 2.3–4.6)
Potassium: 4.2 mEq/L (ref 3.7–5.3)
Sodium: 135 mEq/L — ABNORMAL LOW (ref 137–147)

## 2014-01-31 LAB — GLUCOSE, CAPILLARY
GLUCOSE-CAPILLARY: 229 mg/dL — AB (ref 70–99)
GLUCOSE-CAPILLARY: 248 mg/dL — AB (ref 70–99)
GLUCOSE-CAPILLARY: 213 mg/dL — AB (ref 70–99)
Glucose-Capillary: 266 mg/dL — ABNORMAL HIGH (ref 70–99)

## 2014-01-31 MED ORDER — INSULIN GLARGINE 100 UNIT/ML ~~LOC~~ SOLN
20.0000 [IU] | Freq: Every day | SUBCUTANEOUS | Status: DC
  Administered 2014-01-31 – 2014-02-02 (×3): 20 [IU] via SUBCUTANEOUS
  Filled 2014-01-31 (×3): qty 0.2

## 2014-01-31 MED ORDER — LIVING WELL WITH DIABETES BOOK
Freq: Once | Status: AC
  Administered 2014-01-31: 1
  Filled 2014-01-31: qty 1

## 2014-01-31 NOTE — Progress Notes (Signed)
Pt vomitting 400 cc's of mostly liquid yellow emesis c pieces of the pain medicine visible.   Pt remedicated.  Dtr and husband at beside and very open to dm diet education.  Long discussion about diet, rationale, and diabetes.  Family very receptive, asking good questions.  Living well with diabetes book given.  Pt very emotional and at times teary eyed and she is more resistent.  dtr tells me that pt's mother died from diabetes and many of her family are affected.  Pt is making better food choices tonight compared to last night.  Will continue to reinforce

## 2014-01-31 NOTE — Progress Notes (Signed)
S: feels better.  Taking fluids better O:BP 102/61  Pulse 58  Temp(Src) 97.9 F (36.6 C) (Oral)  Resp 20  Ht 5\' 4"  (1.626 m)  Wt 85.276 kg (188 lb)  BMI 32.25 kg/m2  SpO2 97%  Intake/Output Summary (Last 24 hours) at 01/31/14 1031 Last data filed at 01/31/14 0840  Gross per 24 hour  Intake   3820 ml  Output      0 ml  Net   3820 ml   Weight change:  [id number] and alert CVS:RRR Resp:clear Abd:+ BS NTND Ext:no edema NEURO:CNI Ox3 no asterixis   . docusate sodium  100 mg Oral BID  . gabapentin  100 mg Oral QHS  . insulin aspart  0-9 Units Subcutaneous TID WC  . pantoprazole  40 mg Oral Q1200  . QUEtiapine Fumarate  150 mg Oral QHS  . sodium chloride  3 mL Intravenous Q12H   Dg Lumbar Spine Complete  01/30/2014   CLINICAL DATA:  Back pain after fall.  Right leg pain.  EXAM: LUMBAR SPINE - COMPLETE 4+ VIEW  COMPARISON:  01/04/2009  FINDINGS: There is no evidence of lumbar spine fracture. Alignment is normal. Intervertebral disc spaces are maintained.  There is a 15 mm ill-defined calcification in the right upper quadrant, compatible with cholelithiasis. Abdominal ultrasound is pending. Small calcifications right lateral of the lower lumbar spine, likely incidental based on the history. These could be ovarian vein phleboliths. Question aortic atherosclerosis.  IMPRESSION: 1. No acute osseous findings or significant degenerative change. 2. Cholelithiasis.   Electronically Signed   By: Jorje Guild M.D.   On: 01/30/2014 08:46   Dg Hip Complete Right  01/29/2014   CLINICAL DATA:  Fall.  EXAM: RIGHT HIP - COMPLETE 2+ VIEW  COMPARISON:  None.  FINDINGS: Calcifications pelvis consistent with phleboliths. No acute bony or joint abnormality identified. No evidence of fracture or dislocation.  IMPRESSION: No acute abnormality identified.   Electronically Signed   By: Marcello Moores  Register   On: 01/29/2014 20:10   Ct Head Wo Contrast  01/29/2014   CLINICAL DATA:  Status post fall  EXAM: CT  HEAD WITHOUT CONTRAST  CT MAXILLOFACIAL WITHOUT CONTRAST  TECHNIQUE: Multidetector CT imaging of the head and maxillofacial structures were performed using the standard protocol without intravenous contrast. Multiplanar CT image reconstructions of the maxillofacial structures were also generated.  COMPARISON:  10/26/2009  FINDINGS: CT HEAD FINDINGS  No acute cortical infarct, hemorrhage, or mass lesion ispresent. Ventricles are of normal size. No significant extra-axial fluid collection is present. The paranasal sinuses andmastoid air cells are clear. The osseous skull is intact.  CT MAXILLOFACIAL FINDINGS  Comminuted fracture involves the left mandibular condyle. Mild ventral angulation of the distal fracture fragments. No dislocation. The remaining facial bones appear intact.  IMPRESSION: 1. No acute intracranial abnormalities. 2. Acute fracture of the left mandibular condyle.   Electronically Signed   By: Kerby Moors M.D.   On: 01/29/2014 18:54   US Abdomen Complete  01/30/2014   CLINICAL DATA:  Evaluate liver and gallbladder. Elevated liver function tests.  EXAM: ULTRASOUND ABDOMEN COMPLETE  COMPARISON:  None.  FINDINGS: Gallbladder:  At least 1 large, mobile gallstone is present. No wall thickening or sonographic Murphy sign.  Common bile duct:  Diameter: 6 mm.  Were visualized, no filling defect.  Liver:  No focal lesion. Antegrade flow in the imaged hepatic and portal venous system. No definitive fatty infiltration.  IVC:  No abnormality visualized.  Pancreas:  The  duct in the body is visible, but not dilated at 2 mm. There is no evidence of parenchymal calcification.  Spleen:  Size and appearance within normal limits.  Right Kidney:  Length: 12 cm. Echogenicity within normal limits. No mass or hydronephrosis visualized.  Left Kidney:  Length: 12 cm. Echogenicity within normal limits. No mass or hydronephrosis visualized.  Abdominal aorta:  The aortic bifurcation is not visualized. Otherwise, no  evidence of aneurysm.  Other findings:  None.  IMPRESSION: Cholelithiasis without acute cholecystitis.   Electronically Signed   By: Jorje Guild M.D.   On: 01/30/2014 09:04   Ct Maxillofacial Wo Cm  01/29/2014   CLINICAL DATA:  Status post fall  EXAM: CT HEAD WITHOUT CONTRAST  CT MAXILLOFACIAL WITHOUT CONTRAST  TECHNIQUE: Multidetector CT imaging of the head and maxillofacial structures were performed using the standard protocol without intravenous contrast. Multiplanar CT image reconstructions of the maxillofacial structures were also generated.  COMPARISON:  10/26/2009  FINDINGS: CT HEAD FINDINGS  No acute cortical infarct, hemorrhage, or mass lesion ispresent. Ventricles are of normal size. No significant extra-axial fluid collection is present. The paranasal sinuses andmastoid air cells are clear. The osseous skull is intact.  CT MAXILLOFACIAL FINDINGS  Comminuted fracture involves the left mandibular condyle. Mild ventral angulation of the distal fracture fragments. No dislocation. The remaining facial bones appear intact.  IMPRESSION: 1. No acute intracranial abnormalities. 2. Acute fracture of the left mandibular condyle.   Electronically Signed   By: Kerby Moors M.D.   On: 01/29/2014 18:54   BMET    Component Value Date/Time   NA 135* 01/31/2014 0426   K 4.2 01/31/2014 0426   CL 97 01/31/2014 0426   CO2 22 01/31/2014 0426   GLUCOSE 333* 01/31/2014 0426   BUN 27* 01/31/2014 0426   CREATININE 2.50* 01/31/2014 0426   CALCIUM 9.2 01/31/2014 0426   GFRNONAA 21* 01/31/2014 0426   GFRAA 25* 01/31/2014 0426   CBC    Component Value Date/Time   WBC 5.1 01/30/2014 0422   RBC 3.81* 01/30/2014 0422   HGB 11.5* 01/30/2014 0422   HCT 33.8* 01/30/2014 0422   PLT 179 01/30/2014 0422   MCV 88.7 01/30/2014 0422   MCH 30.2 01/30/2014 0422   MCHC 34.0 01/30/2014 0422   RDW 12.0 01/30/2014 0422   LYMPHSABS 1.4 01/29/2014 1717   MONOABS 0.9 01/29/2014 1717   EOSABS 0.7 01/29/2014 1717   BASOSABS 0.1 01/29/2014  1717     Assessment:  1. AKI sec volume depletion, hypotension on ARB, UO not recorded but Scr improving 2. Hx HTN 3. DM 4. Bipolar Ds  Plan: 1 decrease IV fluids 2. Recheck labs in AM 3. If Scr is lower in AM then she could be DC'd from renal standpoint and follow up with her PCP   Carol [name] T

## 2014-01-31 NOTE — Progress Notes (Signed)
[facility] PROGRESS NOTE  Myrah Minotti [id number] DOB: 1960-09-02 DOA: 01/29/2014 PCP: Philis Fendt, MD  Assessment/Plan: Acute renal failure: In setting poor oral intake, diuretics. Cr peak at 5.3. It has decreased to 4.0. Improved with IV fluids.  UPEP, SPEP pending.   Diabetes mellitus: CBG in the 120 range. Resume lantus lower than home dose. HBA1 c at 12.  SSI.   Closed jaw fracture - ENT is aware recommend mechanical diet. Outpatient follow up in 1 week.   Recent syncope; probably related to hypotension. Holding BP medications. ECHO with normal EF.   Lower extremity Paresthesia worse right -will write for Neurontin. X ray:  No acute osseous findings or significant degenerative change. Will order MRI to better evaluate. B-12 normal. This is likely related to diabetic neuropathy in setting of uncontrolled diabetes.   Prolonged QTC; Monitor on Seroquel. Stable.   Transaminases; stable. [place]; Cholelithiasis without acute cholecystitis. No focal lesion. Antegrade flow in the imaged hepatic and portal venous system. No definitive fatty infiltration.. Hepatitis viral panel negative and HIV negative.     Code Status: Full Code.  Family Communication:  Disposition Plan: Remain inpatient.    Consultants:  ENT  Procedures: ECHO;Left ventricle: The cavity size was normal. Wall thickness was increased in a pattern of moderate LVH. The estimated ejection fraction was 65%. Wall motion was normal; there were no regional wall motion abnormalities. - Right ventricle: The cavity size was normal. Systolic function was normal.   [place];  Cholelithiasis without acute cholecystitis. No focal lesion. Antegrade flow in the imaged hepatic and portal venous system. No definitive fatty infiltration.  Antibiotics:  none  HPI/Subjective: Feeling better. I provide counseling regarding diabetes controlled, affection of renal function, and drugs use.   Objective: Filed Vitals:    01/31/14 0540  BP: 102/61  Pulse: 58  Temp: 97.9 F (36.6 C)  Resp: 20    Intake/Output Summary (Last 24 hours) at 01/31/14 1322 Last data filed at 01/31/14 1233  Gross per 24 hour  Intake   3700 ml  Output   1800 ml  Net   1900 ml   Filed Weights   01/29/14 2137  Weight: 85.276 kg (188 lb)    Exam:   General:  No distress.   Cardiovascular: S 1, S 2 RRR  Respiratory: CTA  Abdomen: BS present, soft, NT  Musculoskeletal: no edema  Neuro exam, right LE mild weakness.   Data Reviewed: Basic Metabolic Panel:  Recent Labs Lab 01/29/14 1717 01/30/14 0422 01/31/14 0426  NA 132* 135* 135*  K 3.5* 3.1* 4.2  CL 90* 95* 97  CO2 24 23 22   GLUCOSE 160* 120* 333*  BUN 55* 46* 27*  CREATININE 5.38* 4.04* 2.50*  CALCIUM 9.8 9.7 9.2  MG  --  2.4  --   PHOS  --  4.7* 3.3   Liver Function Tests:  Recent Labs Lab 01/29/14 1717 01/30/14 0422 01/31/14 0426  AST 94* 94*  --   ALT 38* 40*  --   ALKPHOS 134* 122*  --   BILITOT 0.3 0.3  --   PROT 8.2 7.4  --   ALBUMIN 3.3* 3.1* 2.8*   No results found for this basename: LIPASE, AMYLASE,  in the last 168 hours No results found for this basename: AMMONIA,  in the last 168 hours CBC:  Recent Labs Lab 01/29/14 1717 01/30/14 0422  WBC 6.1 5.1  NEUTROABS 3.0  --   HGB 12.0 11.5*  HCT 34.4* 33.8*  MCV  87.8 88.7  PLT 187 179   Cardiac Enzymes: No results found for this basename: CKTOTAL, CKMB, CKMBINDEX, TROPONINI,  in the last 168 hours BNP (last 3 results) No results found for this basename: PROBNP,  in the last 8760 hours CBG:  Recent Labs Lab 01/30/14 1156 01/30/14 1651 01/30/14 2056 01/31/14 0734 01/31/14 1213  GLUCAP 218* 278* 234* 229* 248*    No results found for this or any previous visit (from the past 240 hour(s)).   Studies: Dg Lumbar Spine Complete  01/30/2014   CLINICAL DATA:  Back pain after fall.  Right leg pain.  EXAM: LUMBAR SPINE - COMPLETE 4+ VIEW  COMPARISON:  01/04/2009   FINDINGS: There is no evidence of lumbar spine fracture. Alignment is normal. Intervertebral disc spaces are maintained.  There is a 15 mm ill-defined calcification in the right upper quadrant, compatible with cholelithiasis. Abdominal ultrasound is pending. Small calcifications right lateral of the lower lumbar spine, likely incidental based on the history. These could be ovarian vein phleboliths. Question aortic atherosclerosis.  IMPRESSION: 1. No acute osseous findings or significant degenerative change. 2. Cholelithiasis.   Electronically Signed   By: Jorje Guild M.D.   On: 01/30/2014 08:46   Dg Hip Complete Right  01/29/2014   CLINICAL DATA:  Fall.  EXAM: RIGHT HIP - COMPLETE 2+ VIEW  COMPARISON:  None.  FINDINGS: Calcifications pelvis consistent with phleboliths. No acute bony or joint abnormality identified. No evidence of fracture or dislocation.  IMPRESSION: No acute abnormality identified.   Electronically Signed   By: Marcello Moores  Register   On: 01/29/2014 20:10   Ct Head Wo Contrast  01/29/2014   CLINICAL DATA:  Status post fall  EXAM: CT HEAD WITHOUT CONTRAST  CT MAXILLOFACIAL WITHOUT CONTRAST  TECHNIQUE: Multidetector CT imaging of the head and maxillofacial structures were performed using the standard protocol without intravenous contrast. Multiplanar CT image reconstructions of the maxillofacial structures were also generated.  COMPARISON:  10/26/2009  FINDINGS: CT HEAD FINDINGS  No acute cortical infarct, hemorrhage, or mass lesion ispresent. Ventricles are of normal size. No significant extra-axial fluid collection is present. The paranasal sinuses andmastoid air cells are clear. The osseous skull is intact.  CT MAXILLOFACIAL FINDINGS  Comminuted fracture involves the left mandibular condyle. Mild ventral angulation of the distal fracture fragments. No dislocation. The remaining facial bones appear intact.  IMPRESSION: 1. No acute intracranial abnormalities. 2. Acute fracture of the left  mandibular condyle.   Electronically Signed   By: Kerby Moors M.D.   On: 01/29/2014 18:54   US Abdomen Complete  01/30/2014   CLINICAL DATA:  Evaluate liver and gallbladder. Elevated liver function tests.  EXAM: ULTRASOUND ABDOMEN COMPLETE  COMPARISON:  None.  FINDINGS: Gallbladder:  At least 1 large, mobile gallstone is present. No wall thickening or sonographic Murphy sign.  Common bile duct:  Diameter: 6 mm.  Were visualized, no filling defect.  Liver:  No focal lesion. Antegrade flow in the imaged hepatic and portal venous system. No definitive fatty infiltration.  IVC:  No abnormality visualized.  Pancreas:  The duct in the body is visible, but not dilated at 2 mm. There is no evidence of parenchymal calcification.  Spleen:  Size and appearance within normal limits.  Right Kidney:  Length: 12 cm. Echogenicity within normal limits. No mass or hydronephrosis visualized.  Left Kidney:  Length: 12 cm. Echogenicity within normal limits. No mass or hydronephrosis visualized.  Abdominal aorta:  The aortic bifurcation is not visualized. Otherwise,  no evidence of aneurysm.  Other findings:  None.  IMPRESSION: Cholelithiasis without acute cholecystitis.   Electronically Signed   By: Jorje Guild M.D.   On: 01/30/2014 09:04   Ct Maxillofacial Wo Cm  01/29/2014   CLINICAL DATA:  Status post fall  EXAM: CT HEAD WITHOUT CONTRAST  CT MAXILLOFACIAL WITHOUT CONTRAST  TECHNIQUE: Multidetector CT imaging of the head and maxillofacial structures were performed using the standard protocol without intravenous contrast. Multiplanar CT image reconstructions of the maxillofacial structures were also generated.  COMPARISON:  10/26/2009  FINDINGS: CT HEAD FINDINGS  No acute cortical infarct, hemorrhage, or mass lesion ispresent. Ventricles are of normal size. No significant extra-axial fluid collection is present. The paranasal sinuses andmastoid air cells are clear. The osseous skull is intact.  CT MAXILLOFACIAL FINDINGS   Comminuted fracture involves the left mandibular condyle. Mild ventral angulation of the distal fracture fragments. No dislocation. The remaining facial bones appear intact.  IMPRESSION: 1. No acute intracranial abnormalities. 2. Acute fracture of the left mandibular condyle.   Electronically Signed   By: Kerby Moors M.D.   On: 01/29/2014 18:54    Scheduled Meds: . docusate sodium  100 mg Oral BID  . gabapentin  100 mg Oral QHS  . insulin aspart  0-9 Units Subcutaneous TID WC  . insulin glargine  20 Units Subcutaneous Daily  . pantoprazole  40 mg Oral Q1200  . QUEtiapine Fumarate  150 mg Oral QHS  . sodium chloride  3 mL Intravenous Q12H   Continuous Infusions: . sodium chloride 50 mL/hr at 01/31/14 1038    Active Problems:   Acute renal failure   Diabetes mellitus   Hypertension   Closed jaw fracture   Paresthesia   Syncope   Elevated LFTs    Time spent: 35 minutes.     Niel Hummer A  [facility] Pager [phone]. If 7PM-7AM, please contact night-coverage at [email or web address], password [facility] 01/31/2014, 1:22 PM  LOS: 2 days

## 2014-02-01 ENCOUNTER — Inpatient Hospital Stay (HOSPITAL_COMMUNITY): Payer: Medicare Other

## 2014-02-01 LAB — COMPREHENSIVE METABOLIC PANEL
ALBUMIN: 2.8 g/dL — AB (ref 3.5–5.2)
ALT: 58 U/L — ABNORMAL HIGH (ref 0–35)
AST: 94 U/L — ABNORMAL HIGH (ref 0–37)
Alkaline Phosphatase: 126 U/L — ABNORMAL HIGH (ref 39–117)
BUN: 15 mg/dL (ref 6–23)
CALCIUM: 9.2 mg/dL (ref 8.4–10.5)
CO2: 26 mEq/L (ref 19–32)
CREATININE: 1.73 mg/dL — AB (ref 0.50–1.10)
Chloride: 101 mEq/L (ref 96–112)
GFR calc Af Amer: 38 mL/min — ABNORMAL LOW (ref >90)
GFR calc non Af Amer: 33 mL/min — ABNORMAL LOW (ref >90)
Glucose, Bld: 205 mg/dL — ABNORMAL HIGH (ref 70–99)
Potassium: 3.8 mEq/L (ref 3.7–5.3)
Sodium: 139 mEq/L (ref 137–147)
TOTAL PROTEIN: 6.7 g/dL (ref 6.0–8.3)
Total Bilirubin: 0.2 mg/dL — ABNORMAL LOW (ref 0.3–1.2)

## 2014-02-01 LAB — GLUCOSE, CAPILLARY
GLUCOSE-CAPILLARY: 185 mg/dL — AB (ref 70–99)
GLUCOSE-CAPILLARY: 343 mg/dL — AB (ref 70–99)
Glucose-Capillary: 321 mg/dL — ABNORMAL HIGH (ref 70–99)
Glucose-Capillary: 201 mg/dL — ABNORMAL HIGH (ref 70–99)

## 2014-02-01 LAB — CK: Total CK: 48 U/L (ref 7–177)

## 2014-02-01 MED ORDER — LORAZEPAM 2 MG/ML IJ SOLN
INTRAMUSCULAR | Status: AC
  Filled 2014-02-01: qty 1

## 2014-02-01 MED ORDER — LORAZEPAM 2 MG/ML IJ SOLN: 1.0000 mg | Freq: Once | INTRAMUSCULAR | Status: DC

## 2014-02-01 MED ORDER — MORPHINE SULFATE 2 MG/ML IJ SOLN
2.0000 mg | Freq: Once | INTRAMUSCULAR | Status: AC
  Administered 2014-02-01: 2 mg via INTRAVENOUS
  Filled 2014-02-01: qty 1

## 2014-02-01 MED ORDER — SENNOSIDES-DOCUSATE SODIUM 8.6-50 MG PO TABS
1.0000 | ORAL_TABLET | Freq: Two times a day (BID) | ORAL | Status: DC
  Administered 2014-02-02: 1 via ORAL
  Filled 2014-02-01 (×3): qty 1

## 2014-02-01 NOTE — Progress Notes (Signed)
[facility] PROGRESS NOTE  Carol [name] [id number] DOB: [birth year] DOA: 01/29/2014 PCP: Philis Fendt, MD  Assessment/Plan: Acute renal failure: In setting poor oral intake, diuretics. Cr peak at 5.3. It has decreased to 1.7  Improved with IV fluids.  UPEP, SPEP pending.   Diabetes mellitus: CBG in the 120 range.  lantus lower than home dose. HBA1 c at 12.  SSI.   Closed jaw fracture - ENT is aware recommend mechanical diet. Outpatient follow up in 1 week.   Recent syncope; probably related to hypotension. Holding BP medications. ECHO with normal EF.   Lower extremity Paresthesia worse right -will write for Neurontin. X ray:  No acute osseous findings or significant degenerative change. Will order MRI to better evaluate. B-12 normal. This is likely related to diabetic neuropathy in setting of uncontrolled diabetes. MRI with only disc bulge, no nerve involvement.   Prolonged QTC; Monitor on Seroquel. Stable.   Transaminases; stable. [place]; Cholelithiasis without acute cholecystitis. No focal lesion. Antegrade flow in the imaged hepatic and portal venous system. No definitive fatty infiltration.. Hepatitis viral panel negative and HIV negative. Continue to have episode of vomiting. Unclear if this is related to cholelithiasis Vs gastroparesis. GI consulted.      Code Status: Full Code.  Family Communication:  Disposition Plan: Remain inpatient.    Consultants:  ENT  Procedures: ECHO;Left ventricle: The cavity size was normal. Wall thickness was increased in a pattern of moderate LVH. The estimated ejection fraction was 65%. Wall motion was normal; there were no regional wall motion abnormalities. - Right ventricle: The cavity size was normal. Systolic function was normal.   [place];  Cholelithiasis without acute cholecystitis. No focal lesion. Antegrade flow in the imaged hepatic and portal venous system. No definitive fatty  infiltration.  Antibiotics:  none  HPI/Subjective: Vomited again yesterday. Vomiting after eating.    Objective: Filed Vitals:   02/01/14 1500  BP: 148/85  Pulse: 77  Temp: 97.8 F (36.6 C)  Resp: 18    Intake/Output Summary (Last 24 hours) at 02/01/14 1553 Last data filed at 02/01/14 1300  Gross per 24 hour  Intake   5200 ml  Output   5050 ml  Net    150 ml   Filed Weights   01/29/14 2137  Weight: 85.276 kg (188 lb)    Exam:   General:  No distress.   Cardiovascular: S 1, S 2 RRR  Respiratory: CTA  Abdomen: BS present, soft, NT  Musculoskeletal: no edema  Neuro exam, right LE mild weakness.   Data Reviewed: Basic Metabolic Panel:  Recent Labs Lab 01/29/14 1717 01/30/14 0422 01/31/14 0426 02/01/14 0625  NA 132* 135* 135* 139  K 3.5* 3.1* 4.2 3.8  CL 90* 95* 97 101  CO2 24 23 22 26   GLUCOSE 160* 120* 333* 205*  BUN 55* 46* 27* 15  CREATININE 5.38* 4.04* 2.50* 1.73*  CALCIUM 9.8 9.7 9.2 9.2  MG  --  2.4  --   --   PHOS  --  4.7* 3.3  --    Liver Function Tests:  Recent Labs Lab 01/29/14 1717 01/30/14 0422 01/31/14 0426 02/01/14 0625  AST 94* 94*  --  94*  ALT 38* 40*  --  58*  ALKPHOS 134* 122*  --  126*  BILITOT 0.3 0.3  --  0.2*  PROT 8.2 7.4  --  6.7  ALBUMIN 3.3* 3.1* 2.8* 2.8*   No results found for this basename: LIPASE, AMYLASE,  in the last  168 hours No results found for this basename: AMMONIA,  in the last 168 hours CBC:  Recent Labs Lab 01/29/14 1717 01/30/14 0422  WBC 6.1 5.1  NEUTROABS 3.0  --   HGB 12.0 11.5*  HCT 34.4* 33.8*  MCV 87.8 88.7  PLT 187 179   Cardiac Enzymes: No results found for this basename: CKTOTAL, CKMB, CKMBINDEX, TROPONINI,  in the last 168 hours BNP (last 3 results) No results found for this basename: PROBNP,  in the last 8760 hours CBG:  Recent Labs Lab 01/31/14 1213 01/31/14 1659 01/31/14 2124 02/01/14 0835 02/01/14 1146  GLUCAP 248* 266* 213* 185* 343*    No results  found for this or any previous visit (from the past 240 hour(s)).   Studies: Mr Thoracic Spine Wo Contrast  02/01/2014   CLINICAL DATA:  Recent onset lower extremity weakness, worse on the right. Nausea and vomiting.  EXAM: MRI THORACIC AND LUMBAR SPINE WITHOUT CONTRAST  TECHNIQUE: Multiplanar and multiecho pulse sequences of the thoracic and lumbar spine were obtained without intravenous contrast.  COMPARISON:  None.  FINDINGS: MR THORACIC SPINE FINDINGS  Vertebral body height and alignment are maintained. 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 There is no worrisome marrow lesion. The thoracic cord demonstrates normal signal throughout. The central spinal canal and neural foramina are widely patent at all levels. Paraspinous structures are unremarkable.  MR LUMBAR SPINE FINDINGS  Vertebral body height, signal and alignment are normal. The conus medullaris is normal in signal and position. The central spinal canal and neural foramina are widely patent at all levels. Disc height and hydration are maintained from L1-2 to L4-5. There is disc desiccation and minimal bulging at L5-S1. Paraspinous structures are unremarkable.  IMPRESSION: MR THORACIC SPINE IMPRESSION  Negative thoracic spine MRI.  MR LUMBAR SPINE IMPRESSION  Minimal disc bulge L5-S1 without central canal or foraminal stenosis. No nerve root encroachment. The lumbar spine is otherwise negative.   Electronically Signed   By: Inge Rise M.D.   On: 02/01/2014 08:33   Mr Lumbar Spine Wo Contrast  02/01/2014   CLINICAL DATA:  Recent onset lower extremity weakness, worse on the right. Nausea and vomiting.  EXAM: MRI THORACIC AND LUMBAR SPINE WITHOUT CONTRAST  TECHNIQUE: Multiplanar and multiecho pulse sequences of the thoracic and lumbar spine were obtained without intravenous contrast.  COMPARISON:  None.  FINDINGS: MR THORACIC SPINE FINDINGS  Vertebral body height and alignment are maintained. 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 There is no worrisome marrow lesion.  The thoracic cord demonstrates normal signal throughout. The central spinal canal and neural foramina are widely patent at all levels. Paraspinous structures are unremarkable.  MR LUMBAR SPINE FINDINGS  Vertebral body height, signal and alignment are normal. The conus medullaris is normal in signal and position. The central spinal canal and neural foramina are widely patent at all levels. Disc height and hydration are maintained from L1-2 to L4-5. There is disc desiccation and minimal bulging at L5-S1. Paraspinous structures are unremarkable.  IMPRESSION: MR THORACIC SPINE IMPRESSION  Negative thoracic spine MRI.  MR LUMBAR SPINE IMPRESSION  Minimal disc bulge L5-S1 without central canal or foraminal stenosis. No nerve root encroachment. The lumbar spine is otherwise negative.   Electronically Signed   By: Inge Rise M.D.   On: 02/01/2014 08:33    Scheduled Meds: . docusate sodium  100 mg Oral BID  . gabapentin  100 mg Oral QHS  . insulin aspart  0-9 Units Subcutaneous TID WC  . insulin glargine  20 Units Subcutaneous Daily  . LORazepam      . LORazepam  1 mg Intravenous Once  . pantoprazole  40 mg Oral Q1200  . QUEtiapine Fumarate  150 mg Oral QHS  . sodium chloride  3 mL Intravenous Q12H   Continuous Infusions: . sodium chloride 50 mL/hr at 01/31/14 1038    Active Problems:   Acute renal failure   Diabetes mellitus   Hypertension   Closed jaw fracture   Paresthesia   Syncope   Elevated LFTs    Time spent: 30 minutes.     Niel Hummer A  [facility] Pager [phone]. If 7PM-7AM, please contact night-coverage at [email or web address], password [facility] 02/01/2014, 3:53 PM  LOS: 3 days

## 2014-02-01 NOTE — Progress Notes (Signed)
Pt requesting pain medication. Upon entering room pt stated "I have been on this pain medication for so long, that it has now stopped working. I want something else. Writer explained to pt that she didn't have any other medication ordered and I could call the on call doctor to see if they would give her something different." pt stated "i'll take the percocet now, but in need you to call for something else." Tylene Fantasia, NP text paged. Awaiting return call. Will continue to monitor.

## 2014-02-01 NOTE — Consult Note (Addendum)
Referring Provider: No ref. provider found Primary Care Physician:  Philis Fendt, MD Primary Gastroenterologist:  None, unassigned  Reason for Consultation:  Vomiting; transaminitis  HPI: Zarahi Foor is a 53 y.o. female with PMH of HTN, DM, depression, and chronic back pain.  She presented on [facility] ED on 6/12 after having a fall 4 days prior.  Hit her jaw when she fell.  She complained of feeling dizzy/light headed.  She was found to have acute renal failure with Cr of 5.38.  Renal was consulted and she was given IVF's with good response/recovery of renal function.  Ever since she fell one week ago she reports intermittent episodes of vomiting.  Says that it occurs randomly about every other day or so (not every day).  Says that she vomited last night, but not today.  She is not a great historian.  Says that prior to the fall she did not have this problem.  CT of the head without contrast on admission showed an acute fracture of the left mandibular condyle, but no other intracranial issues.  She denies abdominal pain.  She has elevated LFT's with normal total bili, ALP 126, AST 94, and ALT 58.  Looking over past labs, LFT's have been elevated in the past (4 years ago) with AST 451, ALT 88, and ALP 234.  Total bili normal at that time as well.  She says that she is aware that they were elevated in the past.  She says that she drinks some beer about once a week, but has never been a big drinker.  She says that her brother died from liver problems, but he drank a lot of ETOH.  Ultrasound on this admission showed cholelithiasis without acute cholecystitis.  CBD 6 mm.  No evidence of liver issues.  Due to elevated LFT's and patient's intermittent vomiting GI was consulted.  Once again, patient denies abdominal pain.  She has never seen a GI doctor in the past.  She has that she has regular BM's for the most part without blood.  Her largest complaint today is numbness and pain in her right  leg.   Past Medical History  Diagnosis Date  . Hypertension   . Diabetes mellitus   . Depression   . Chronic back pain   . Palpitations     Past Surgical History  Procedure Laterality Date  . Carpal tunnel release      Prior to Admission medications   Medication Sig Start Date End Date Taking? Authorizing Provider  Azilsartan-Chlorthalidone (EDARBYCLOR) 40-12.5 MG TABS Take 1 tablet by mouth daily.   Yes Historical Provider, MD  glimepiride (AMARYL) 4 MG tablet Take 4 mg by mouth 2 (two) times daily.    Yes Historical Provider, MD  insulin aspart (NOVOLOG) 100 UNIT/ML injection Inject 1-12 Units into the skin 3 (three) times daily with meals. Sliding scale   Yes Historical Provider, MD  insulin glargine (LANTUS) 100 UNIT/ML injection Inject 50 Units into the skin every morning.    Yes Historical Provider, MD  metFORMIN (GLUCOPHAGE) 500 MG tablet Take 500 mg by mouth 2 (two) times daily with a meal.     Yes Historical Provider, MD  nebivolol (BYSTOLIC) 10 MG tablet Take 10 mg by mouth daily.   Yes Historical Provider, MD  QUEtiapine Fumarate (SEROQUEL XR) 150 MG 24 hr tablet Take 150 mg by mouth at bedtime.     Yes Historical Provider, MD  simvastatin (ZOCOR) 10 MG tablet Take 10 mg by mouth  daily at 6 PM.   Yes Historical Provider, MD    Current Facility-Administered Medications  Medication Dose Route Frequency Provider Last Rate Last Dose  . 0.9 %  sodium chloride infusion   Intravenous Continuous Windy Kalata, MD 50 mL/hr at 01/31/14 1038    . acetaminophen (TYLENOL) tablet 650 mg  650 mg Oral Q6H PRN Toy Baker, MD       Or  . acetaminophen (TYLENOL) suppository 650 mg  650 mg Rectal Q6H PRN Toy Baker, MD      . docusate sodium (COLACE) capsule 100 mg  100 mg Oral BID Toy Baker, MD   100 mg at 02/01/14 1023  . gabapentin (NEURONTIN) capsule 100 mg  100 mg Oral QHS Toy Baker, MD   100 mg at 01/31/14 2237  . insulin aspart (novoLOG)  injection 0-9 Units  0-9 Units Subcutaneous TID WC Toy Baker, MD   7 Units at 02/01/14 1218  . insulin glargine (LANTUS) injection 20 Units  20 Units Subcutaneous Daily Belkys A Regalado, MD   20 Units at 02/01/14 1023  . LORazepam (ATIVAN) 2 MG/ML injection           . LORazepam (ATIVAN) injection 1 mg  1 mg Intravenous Once Belkys A Regalado, MD      . ondansetron (ZOFRAN) injection 4 mg  4 mg Intravenous Q6H PRN Belkys A Regalado, MD   4 mg at 02/01/14 0702  . oxyCODONE-acetaminophen (PERCOCET/ROXICET) 5-325 MG per tablet 1 tablet  1 tablet Oral Q4H PRN Toy Baker, MD   1 tablet at 02/01/14 0702  . pantoprazole (PROTONIX) EC tablet 40 mg  40 mg Oral Q1200 Toy Baker, MD   40 mg at 02/01/14 1218  . QUEtiapine (SEROQUEL XR) 24 hr tablet 150 mg  150 mg Oral QHS Toy Baker, MD   150 mg at 01/31/14 2236  . sodium chloride 0.9 % injection 3 mL  3 mL Intravenous Q12H Toy Baker, MD   3 mL at 01/31/14 2237    Allergies as of 01/29/2014 - Review Complete 01/29/2014  Allergen Reaction Noted  . Penicillins  02/04/2012    Family History  Problem Relation Age of Onset  . Diabetes Mother   . Kidney disease Mother   . Seizures Father   . Hypertension Father   . Stroke Father   . Asthma Other     History   Social History  . Marital Status: Single    Social History Main Topics  . Smoking status: Current Some Day Smoker -- 0.50 packs/day for 3 years    Types: Cigarettes    Last Attempt to Quit: 09/01/2011  . Smokeless tobacco: Not on file  . Alcohol Use: Yes     Comment: occasional  . Drug Use: No  .     Review of Systems: Ten point ROS is O/W negative except as mentioned in HPI.  Physical Exam: Vital signs in last 24 hours: Temp:  [97.7 F (36.5 C)-98.1 F (36.7 C)] 98.1 F (36.7 C) (06/15 0645) Pulse Rate:  [65-70] 65 (06/15 0645) Resp:  [18-20] 20 (06/15 0645) BP: (108-158)/(56-84) 108/56 mmHg (06/15 0645) SpO2:  [98 %-100 %] 98 %  (06/15 0645) Last BM Date: 01/29/14 General:  Alert, Well-developed, well-nourished, pleasant and cooperative in NAD Head:  Normocephalic and atraumatic. Eyes:  Sclera clear, no icterus.  Conjunctiva pink. Ears:  Normal auditory acuity. Mouth:  No deformity or lesions.   Lungs:  Clear throughout to auscultation.  No wheezes, crackles,  or rhonchi.  Heart:  Regular rate and rhythm; no murmurs, clicks, rubs, or gallops. Abdomen:  Soft, non-distended.  BS present.  Non-tender.   Rectal:  Deferred  Msk:  Symmetrical without gross deformities. Pulses:  Normal pulses noted. Extremities:  Without clubbing or edema. Neurologic:  Alert and  oriented x4;  grossly normal neurologically. Skin:  Intact without significant lesions or rashes. Psych:  Alert and cooperative. Normal mood and affect.  Intake/Output from previous day: 06/14 0701 - 06/15 0700 In: [id number] [P.O.:4160; I.V.:1375] Out: 6051 [Urine:5650; Emesis/NG output:401] Intake/Output this shift: Total I/O In: 120 [P.O.:120] Out: 800 [Urine:800]  Lab Results:  Recent Labs  01/29/14 1717 01/30/14 0422  WBC 6.1 5.1  HGB 12.0 11.5*  HCT 34.4* 33.8*  PLT 187 179   BMET  Recent Labs  01/30/14 0422 01/31/14 0426 02/01/14 0625  NA 135* 135* 139  K 3.1* 4.2 3.8  CL 95* 97 101  CO2 23 22 26   GLUCOSE 120* 333* 205*  BUN 46* 27* 15  CREATININE 4.04* 2.50* 1.73*  CALCIUM 9.7 9.2 9.2   LFT  Recent Labs  02/01/14 0625  PROT 6.7  ALBUMIN 2.8*  AST 94*  ALT 58*  ALKPHOS 126*  BILITOT 0.2*   PT/INR  Recent Labs  01/30/14 0422  LABPROT 13.6  INR 1.06   Hepatitis Panel  Recent Labs  01/30/14 0422  HEPBSAG NEGATIVE  HCVAB NEGATIVE  HEPAIGM NON REACTIVE  HEPBIGM NON REACTIVE    Studies/Results: Mr Thoracic Spine Wo Contrast  02/01/2014   CLINICAL DATA:  Recent onset lower extremity weakness, worse on the right. Nausea and vomiting.  EXAM: MRI THORACIC AND LUMBAR SPINE WITHOUT CONTRAST  TECHNIQUE: Multiplanar  and multiecho pulse sequences of the thoracic and lumbar spine were obtained without intravenous contrast.  COMPARISON:  None.  FINDINGS: MR THORACIC SPINE FINDINGS  Vertebral body height and alignment are maintained. 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 There is no worrisome marrow lesion. The thoracic cord demonstrates normal signal throughout. The central spinal canal and neural foramina are widely patent at all levels. Paraspinous structures are unremarkable.  MR LUMBAR SPINE FINDINGS  Vertebral body height, signal and alignment are normal. The conus medullaris is normal in signal and position. The central spinal canal and neural foramina are widely patent at all levels. Disc height and hydration are maintained from L1-2 to L4-5. There is disc desiccation and minimal bulging at L5-S1. Paraspinous structures are unremarkable.  IMPRESSION: MR THORACIC SPINE IMPRESSION  Negative thoracic spine MRI.  MR LUMBAR SPINE IMPRESSION  Minimal disc bulge L5-S1 without central canal or foraminal stenosis. No nerve root encroachment. The lumbar spine is otherwise negative.   Electronically Signed   By: Inge Rise M.D.   On: 02/01/2014 08:33   Mr Lumbar Spine Wo Contrast  02/01/2014   CLINICAL DATA:  Recent onset lower extremity weakness, worse on the right. Nausea and vomiting.  EXAM: MRI THORACIC AND LUMBAR SPINE WITHOUT CONTRAST  TECHNIQUE: Multiplanar and multiecho pulse sequences of the thoracic and lumbar spine were obtained without intravenous contrast.  COMPARISON:  None.  FINDINGS: MR THORACIC SPINE FINDINGS  Vertebral body height and alignment are maintained. 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 There is no worrisome marrow lesion. The thoracic cord demonstrates normal signal throughout. The central spinal canal and neural foramina are widely patent at all levels. Paraspinous structures are unremarkable.  MR LUMBAR SPINE FINDINGS  Vertebral body height, signal and alignment are normal. The conus medullaris is normal in signal  and position. The central spinal canal and neural foramina are widely patent at all levels. Disc height and hydration are maintained from L1-2 to L4-5. There is disc desiccation and minimal bulging at L5-S1. Paraspinous structures are unremarkable.  IMPRESSION: MR THORACIC SPINE IMPRESSION  Negative thoracic spine MRI.  MR LUMBAR SPINE IMPRESSION  Minimal disc bulge L5-S1 without central canal or foraminal stenosis. No nerve root encroachment. The lumbar spine is otherwise negative.   Electronically Signed   By: Inge Rise M.D.   On: 02/01/2014 08:33    IMPRESSION:  -Vomiting:  I am not sure that this is related to the cholelithiasis.  ? Diabetic gastroparesis.  Is on pantoprazole 40 mg daily; should continue for now. -Elevated LFT's:  Have been elevated even higher in the past.  Liver appears normal on ultrasound.  Viral hepatitis panel is negative. -Cholelithiasis on ultrasound without evidence of cholecystitis -AKI:  Improved/resolved with IVF's.  Thought to be from volume depletion and hypotension on ARB. -DM:  Poorly controlled.  PLAN: -Will likely need full serologic evaluation to evaluate chronic transaminitis.  -Anti-emetics prn for now, and continue PPI as stated above. -Will need outpatient screening colonoscopy.  *Final plans per Dr. Carlean Purl.   ZEHR, JESSICA D.  02/01/2014, 12:55 PM  Pager number [phone]     [place] [facility] Attending  I have also seen and assessed the patient and agree with the above note.  Cause of abnl LFT's not clear - she does have myalgias so msucle could be source/ statin Vomiting better - likely from AKI and hyperglycemia.  Plan for:  ANA, total CK, anti-smooth muscle antibody, ferritin Await SPEP  Likely office f/u me Gatha Mayer, MD, [facility] [phone] (pager) 02/01/2014 5:20 PM

## 2014-02-01 NOTE — Progress Notes (Signed)
Inpatient Diabetes Program Recommendations  AACE/ADA: New Consensus Statement on Inpatient Glycemic Control (2013)  Target Ranges:  Prepandial:   less than 140 mg/dL      Peak postprandial:   less than 180 mg/dL (1-2 hours)      Critically ill patients:  140 - 180 mg/dL   Reason for Visit: Diabetes Coordinator Consult  Diabetes history: Type 2 diabetes, poorly controlled  With A1C of 12 Outpatient Diabetes medications: Amaryl 4 mg bid, Glucophage 500 mg bid, Lantus 50 units q am, Novolog 1 to 12 units tid Current orders for Inpatient glycemic control: Lantus 20 units daily, Novolog Sensitve Correction scale tid  Results for TAVI, DOREN (MRN [id number]) as of 02/01/2014 09:30  Ref. Range 01/31/2014 07:34 01/31/2014 12:13 01/31/2014 16:59 01/31/2014 21:24 02/01/2014 08:35  Glucose-Capillary Latest Range: 70-99 mg/dL 229 (H) 248 (H) 266 (H) 213 (H) 185 (H)   Note: Per nurse's note yesterday, patient and family every conversant about diabetes and wanting more education.  Asking good questions.  Patient had eyes closed-- apparently asleep when I entered room.  Called her name, but did not open eyes.  Touched arm repeatedly and patient opened eyes, acknowledged me, but closed eyes again.  Able to get a positive response from her regarding outpatient diabetes education follow-[facility].  She also gave me her phone number to enter into referral.    Reported patient's lethargy to her nurse.  Patient had been very alert and ate breakfast shortly before I came.  However, earlier this morning she had been down in x-ray where she received Ativan.  Given patient's elevated liver function tests and renal issues, would recommend patient's diabetes be managed exclusively by insulin at discharge. Thank you.  Jenilee Franey S. Marcelline Mates, RN, CNS, CDE Inpatient Diabetes Program, team pager [phone]

## 2014-02-01 NOTE — Progress Notes (Signed)
Renal Service Daily Progress Note  S: feels better.  Creat down to 1.7 today  O:BP 151/94  Pulse 97  Temp(Src) 98.1 F (36.7 C) (Oral)  Resp 20  Ht 5\' 4"  (1.626 m)  Wt 85.276 kg (188 lb)  BMI 32.25 kg/m2  SpO2 100%  Intake/Output Summary (Last 24 hours) at 02/01/14 2127 Last data filed at 02/01/14 2121  Gross per 24 hour  Intake   3920 ml  Output   4550 ml  Net   -630 ml   Weight change:  [id number] and alert CVS:RRR Resp:clear Abd:+ BS NTND Ext:no edema NEURO:CNI Ox3 no asterixis   . gabapentin  100 mg Oral QHS  . insulin aspart  0-9 Units Subcutaneous TID WC  . insulin glargine  20 Units Subcutaneous Daily  . LORazepam  1 mg Intravenous Once  . pantoprazole  40 mg Oral Q1200  . QUEtiapine Fumarate  150 mg Oral QHS  . senna-docusate  1 tablet Oral BID  . sodium chloride  3 mL Intravenous Q12H   BMET    Component Value Date/Time   NA 139 02/01/2014 0625   K 3.8 02/01/2014 0625   CL 101 02/01/2014 0625   CO2 26 02/01/2014 0625   GLUCOSE 205* 02/01/2014 0625   BUN 15 02/01/2014 0625   CREATININE 1.73* 02/01/2014 0625   CALCIUM 9.2 02/01/2014 0625   GFRNONAA 33* 02/01/2014 0625   GFRAA 38* 02/01/2014 0625   CBC    Component Value Date/Time   WBC 5.1 01/30/2014 0422   RBC 3.81* 01/30/2014 0422   HGB 11.5* 01/30/2014 0422   HCT 33.8* 01/30/2014 0422   PLT 179 01/30/2014 0422   MCV 88.7 01/30/2014 0422   MCH 30.2 01/30/2014 0422   MCHC 34.0 01/30/2014 0422   RDW 12.0 01/30/2014 0422   LYMPHSABS 1.4 01/29/2014 1717   MONOABS 0.9 01/29/2014 1717   EOSABS 0.7 01/29/2014 1717   BASOSABS 0.1 01/29/2014 1717     Assessment:  1. AKI sec volume depletion / hypotension / ARB- resolving, creat down to 1.7 2. Hx HTN 3. DM 4. Bipolar Ds  Plan: 1 Will sign off, pt can f/u with PCP. Please call w any questions.    Kelly Splinter MD (pgr) [phone]    [phone] 02/01/2014, 9:28 PM

## 2014-02-02 ENCOUNTER — Encounter: Payer: Self-pay | Admitting: Nurse Practitioner

## 2014-02-02 LAB — UIFE/LIGHT CHAINS/TP QN, 24-HR UR
ALPHA 2 UR: DETECTED — AB
Albumin, U: DETECTED
Alpha 1, Urine: DETECTED — AB
Beta, Urine: DETECTED — AB
FREE KAPPA LT CHAINS, UR: 50.9 mg/dL — AB (ref 0.14–2.42)
FREE KAPPA/LAMBDA RATIO: 9.09 ratio (ref 2.04–10.37)
Free Lambda Lt Chains,Ur: 5.6 mg/dL — ABNORMAL HIGH (ref 0.02–0.67)
GAMMA UR: DETECTED — AB
Total Protein, Urine: 58.7 mg/dL

## 2014-02-02 LAB — PROTEIN ELECTROPHORESIS, SERUM
ALBUMIN ELP: 51.3 % — AB (ref 55.8–66.1)
ALPHA-1-GLOBULIN: 6.1 % — AB (ref 2.9–4.9)
ALPHA-2-GLOBULIN: 13.2 % — AB (ref 7.1–11.8)
Beta 2: 5.9 % (ref 3.2–6.5)
Beta Globulin: 6.7 % (ref 4.7–7.2)
GAMMA GLOBULIN: 16.8 % (ref 11.1–18.8)
M-Spike, %: NOT DETECTED g/dL
TOTAL PROTEIN ELP: 7.3 g/dL (ref 6.0–8.3)

## 2014-02-02 LAB — ANA: Anti Nuclear Antibody(ANA): NEGATIVE

## 2014-02-02 LAB — FERRITIN: Ferritin: 364 ng/mL — ABNORMAL HIGH (ref 10–291)

## 2014-02-02 LAB — GLUCOSE, CAPILLARY
Glucose-Capillary: 185 mg/dL — ABNORMAL HIGH (ref 70–99)
Glucose-Capillary: 195 mg/dL — ABNORMAL HIGH (ref 70–99)

## 2014-02-02 MED ORDER — ONDANSETRON HCL 4 MG/2ML IJ SOLN: 4.0000 mg | Freq: Four times a day (QID) | INTRAMUSCULAR | Status: DC | PRN

## 2014-02-02 MED ORDER — PANTOPRAZOLE SODIUM 40 MG PO TBEC: 40.0000 mg | DELAYED_RELEASE_TABLET | Freq: Every day | ORAL | Status: DC

## 2014-02-02 MED ORDER — GABAPENTIN 100 MG PO CAPS: 100.0000 mg | ORAL_CAPSULE | Freq: Every day | ORAL | Status: DC

## 2014-02-02 MED ORDER — INSULIN GLARGINE 100 UNIT/ML ~~LOC~~ SOLN: 25.0000 [IU] | Freq: Every day | SUBCUTANEOUS | Status: DC

## 2014-02-02 MED ORDER — OXYCODONE-ACETAMINOPHEN 5-325 MG PO TABS: 1.0000 | ORAL_TABLET | ORAL | Status: DC | PRN

## 2014-02-02 NOTE — Discharge Summary (Signed)
Physician Discharge Summary  Carol [name] QQI:[id number] DOB: [birth year] DOA: 01/29/2014  PCP: Carol Fendt, MD  Admit date: 01/29/2014 Discharge date: 02/02/2014  Time spent: 35 minutes  Recommendations for Outpatient Follow-up:  1. Needs repeat CBC and LFT.  2. Need B-met to follow up renal function.  3. Please follow up SPEP, UPEP, Anti smooth muscle antibody,  4. Follow up with Dr Carol [name]  Discharge Diagnoses:    Acute renal failure   Diabetes mellitus   Hypertension   Closed jaw fracture   Paresthesia   Syncope   Elevated LFTs   Discharge Condition: stable.   Diet recommendation: soft diet.   Filed Weights   01/29/14 2137  Weight: 85.276 kg (188 lb)    History of present illness:  Carol [name] is a 53 y.o. female  has a past medical history of Hypertension; Diabetes mellitus; Depression; Chronic back pain; and Palpitations.  Presented with  Patient reports 5 day she had a syncopal event when she stood up to answer the door. She reports that for the past 1 month she has been lightheaded when trying to stand up too fast. Patient fell hitting her jaw resulting in laceration.  For the past 1 week she has not eaten well but has been drinking some water. She has been chewing on a lot of Ice.  1 Month ago she was started on EDARBYCLOR. She reports that her light headedness have started since then.  Since her fall she have had some burning sensation in her right thigh. She reports pain with moving her right thigh.  She does report some lower back pain as well.  In ER her labs showed cr of 5.38 with normal baseline 2 years ago. She repots her PCP checked her labs in April but not sure what labs were taken. Patient has been urinating well. Denies foamy or dark urine. She has reported increased thirst this has been going on for years. She reports drinking large amount of water but when questioned it amounts to 6 8 Oz bottles of water a a day   Hospital Course:  Acute  renal failure: In setting poor oral intake, diuretics. Cr peak at 5.3. It has decreased to 1.7 Improved with IV fluids. UPEP, SPEP pending.  Diabetes mellitus: CBG in the 120 range. lantus lower than home dose. HBA1 c at 12. SSI.  Closed jaw fracture - ENT is aware recommend mechanical diet. Outpatient follow up in 1 week.  Recent syncope; probably related to hypotension. Holding BP medications. ECHO with normal EF.  Lower extremity Paresthesia worse right -will write for Neurontin. X ray: No acute osseous findings or significant degenerative change. Will order MRI to better evaluate. B-12 normal. This is likely related to diabetic neuropathy in setting of uncontrolled diabetes. MRI with only disc bulge, no nerve involvement.  Prolonged QTC; Monitor on Seroquel. Stable.  Transaminases; stable. [place]; Cholelithiasis without acute cholecystitis. No focal lesion. Antegrade flow in the imaged hepatic and portal venous system. No definitive fatty infiltration.. Hepatitis viral panel negative and HIV negative. Continue to have episode of vomiting. Unclear if this is related to cholelithiasis Vs gastroparesis. GI consulted. Follow up labs for autoinmune work up.    Procedures: ECHO;Left ventricle: The cavity size was normal. Wall thickness was increased in a pattern of moderate LVH. The estimated ejection fraction was 65%. Wall motion was normal; there were no regional wall motion abnormalities. - Right ventricle: The cavity size was normal. Systolic function was normal.  [place]; Cholelithiasis without acute  cholecystitis. No focal lesion. Antegrade flow in the imaged hepatic and portal venous system. No definitive fatty infiltration.   Consultations:  GI  Renal   Discharge Exam: Filed Vitals:   02/02/14 0530  BP: 105/61  Pulse: 68  Temp: 98.1 F (36.7 C)  Resp: 16    General: no distress.  Cardiovascular: S 1, S 2 RRR Respiratory: CTA  Discharge Instructions You were cared for by a  hospitalist during your hospital stay. If you have any questions about your discharge medications or the care you received while you were in the hospital after you are discharged, you can call the unit and asked to speak with the hospitalist on call if the hospitalist that took care of you is not available. Once you are discharged, your primary care physician will handle any further medical issues. Please note that NO REFILLS for any discharge medications will be authorized once you are discharged, as it is imperative that you return to your primary care physician (or establish a relationship with a primary care physician if you do not have one) for your aftercare needs so that they can reassess your need for medications and monitor your lab values.  Discharge Instructions   Ambulatory referral to Nutrition and Diabetic Education    Complete by:  As directed   A1C 12. Please call to arrange outpatient diabetes education.  May need 1:1 sessions.  [phone].     Diet Carb Modified    Complete by:  As directed      Increase activity slowly    Complete by:  As directed             Medication List    STOP taking these medications       EDARBYCLOR 40-12.5 MG Tabs  Generic drug:  Azilsartan-Chlorthalidone     glimepiride 4 MG tablet  Commonly known as:  AMARYL     metFORMIN 500 MG tablet  Commonly known as:  GLUCOPHAGE     nebivolol 10 MG tablet  Commonly known as:  BYSTOLIC     simvastatin 10 MG tablet  Commonly known as:  ZOCOR      TAKE these medications       gabapentin 100 MG capsule  Commonly known as:  NEURONTIN  Take 1 capsule (100 mg total) by mouth at bedtime.     insulin aspart 100 UNIT/ML injection  Commonly known as:  novoLOG  Inject 1-12 Units into the skin 3 (three) times daily with meals. Sliding scale     insulin glargine 100 UNIT/ML injection  Commonly known as:  LANTUS  Inject 0.25 mLs (25 Units total) into the skin daily.     ondansetron 4 MG/2ML Soln  injection  Commonly known as:  ZOFRAN  Inject 2 mLs (4 mg total) into the vein every 6 (six) hours as needed for nausea or vomiting.     oxyCODONE-acetaminophen 5-325 MG per tablet  Commonly known as:  PERCOCET/ROXICET  Take 1 tablet by mouth every 4 (four) hours as needed for severe pain.     pantoprazole 40 MG tablet  Commonly known as:  PROTONIX  Take 1 tablet (40 mg total) by mouth daily at 12 noon.     SEROQUEL XR 150 MG 24 hr tablet  Generic drug:  QUEtiapine Fumarate  Take 150 mg by mouth at bedtime.       Allergies  Allergen Reactions  . Penicillins     Really bad yeast infection  Follow-up Information   Follow up with Tye Savoy, NP On 02/16/2014. (10:30 am)    Specialty:  Nurse Practitioner   Contact information:   [address] [place] [address] [phone]        The results of significant diagnostics from this hospitalization (including imaging, microbiology, ancillary and laboratory) are listed below for reference.    Significant Diagnostic Studies: Dg Lumbar Spine Complete  01/30/2014   CLINICAL DATA:  Back pain after fall.  Right leg pain.  EXAM: LUMBAR SPINE - COMPLETE 4+ VIEW  COMPARISON:  01/04/2009  FINDINGS: There is no evidence of lumbar spine fracture. Alignment is normal. Intervertebral disc spaces are maintained.  There is a 15 mm ill-defined calcification in the right upper quadrant, compatible with cholelithiasis. Abdominal ultrasound is pending. Small calcifications right lateral of the lower lumbar spine, likely incidental based on the history. These could be ovarian vein phleboliths. Question aortic atherosclerosis.  IMPRESSION: 1. No acute osseous findings or significant degenerative change. 2. Cholelithiasis.   Electronically Signed   By: Jorje Guild M.D.   On: 01/30/2014 08:46   Dg Hip Complete Right  01/29/2014   CLINICAL DATA:  Fall.  EXAM: RIGHT HIP - COMPLETE 2+ VIEW  COMPARISON:  None.  FINDINGS: Calcifications  pelvis consistent with phleboliths. No acute bony or joint abnormality identified. No evidence of fracture or dislocation.  IMPRESSION: No acute abnormality identified.   Electronically Signed   By: Marcello Moores  Register   On: 01/29/2014 20:10   Ct Head Wo Contrast  01/29/2014   CLINICAL DATA:  Status post fall  EXAM: CT HEAD WITHOUT CONTRAST  CT MAXILLOFACIAL WITHOUT CONTRAST  TECHNIQUE: Multidetector CT imaging of the head and maxillofacial structures were performed using the standard protocol without intravenous contrast. Multiplanar CT image reconstructions of the maxillofacial structures were also generated.  COMPARISON:  10/26/2009  FINDINGS: CT HEAD FINDINGS  No acute cortical infarct, hemorrhage, or mass lesion ispresent. Ventricles are of normal size. No significant extra-axial fluid collection is present. The paranasal sinuses andmastoid air cells are clear. The osseous skull is intact.  CT MAXILLOFACIAL FINDINGS  Comminuted fracture involves the left mandibular condyle. Mild ventral angulation of the distal fracture fragments. No dislocation. The remaining facial bones appear intact.  IMPRESSION: 1. No acute intracranial abnormalities. 2. Acute fracture of the left mandibular condyle.   Electronically Signed   By: Kerby Moors M.D.   On: 01/29/2014 18:54   Mr Thoracic Spine Wo Contrast  02/01/2014   CLINICAL DATA:  Recent onset lower extremity weakness, worse on the right. Nausea and vomiting.  EXAM: MRI THORACIC AND LUMBAR SPINE WITHOUT CONTRAST  TECHNIQUE: Multiplanar and multiecho pulse sequences of the thoracic and lumbar spine were obtained without intravenous contrast.  COMPARISON:  None.  FINDINGS: MR THORACIC SPINE FINDINGS  Vertebral body height and alignment are maintained. 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 There is no worrisome marrow lesion. The thoracic cord demonstrates normal signal throughout. The central spinal canal and neural foramina are widely patent at all levels. Paraspinous structures  are unremarkable.  MR LUMBAR SPINE FINDINGS  Vertebral body height, signal and alignment are normal. The conus medullaris is normal in signal and position. The central spinal canal and neural foramina are widely patent at all levels. Disc height and hydration are maintained from L1-2 to L4-5. There is disc desiccation and minimal bulging at L5-S1. Paraspinous structures are unremarkable.  IMPRESSION: MR THORACIC SPINE IMPRESSION  Negative thoracic spine MRI.  MR LUMBAR SPINE IMPRESSION  Minimal disc bulge L5-S1 without central canal or foraminal stenosis. No nerve root encroachment. The lumbar spine is otherwise negative.   Electronically Signed   By: Inge Rise M.D.   On: 02/01/2014 08:33   Mr Lumbar Spine Wo Contrast  02/01/2014   CLINICAL DATA:  Recent onset lower extremity weakness, worse on the right. Nausea and vomiting.  EXAM: MRI THORACIC AND LUMBAR SPINE WITHOUT CONTRAST  TECHNIQUE: Multiplanar and multiecho pulse sequences of the thoracic and lumbar spine were obtained without intravenous contrast.  COMPARISON:  None.  FINDINGS: MR THORACIC SPINE FINDINGS  Vertebral body height and alignment are maintained. 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 There is no worrisome marrow lesion. The thoracic cord demonstrates normal signal throughout. The central spinal canal and neural foramina are widely patent at all levels. Paraspinous structures are unremarkable.  MR LUMBAR SPINE FINDINGS  Vertebral body height, signal and alignment are normal. The conus medullaris is normal in signal and position. The central spinal canal and neural foramina are widely patent at all levels. Disc height and hydration are maintained from L1-2 to L4-5. There is disc desiccation and minimal bulging at L5-S1. Paraspinous structures are unremarkable.  IMPRESSION: MR THORACIC SPINE IMPRESSION  Negative thoracic spine MRI.  MR LUMBAR SPINE IMPRESSION  Minimal disc bulge L5-S1 without central canal or foraminal stenosis. No nerve root  encroachment. The lumbar spine is otherwise negative.   Electronically Signed   By: Inge Rise M.D.   On: 02/01/2014 08:33   US Abdomen Complete  01/30/2014   CLINICAL DATA:  Evaluate liver and gallbladder. Elevated liver function tests.  EXAM: ULTRASOUND ABDOMEN COMPLETE  COMPARISON:  None.  FINDINGS: Gallbladder:  At least 1 large, mobile gallstone is present. No wall thickening or sonographic Murphy sign.  Common bile duct:  Diameter: 6 mm.  Were visualized, no filling defect.  Liver:  No focal lesion. Antegrade flow in the imaged hepatic and portal venous system. No definitive fatty infiltration.  IVC:  No abnormality visualized.  Pancreas:  The duct in the body is visible, but not dilated at 2 mm. There is no evidence of parenchymal calcification.  Spleen:  Size and appearance within normal limits.  Right Kidney:  Length: 12 cm. Echogenicity within normal limits. No mass or hydronephrosis visualized.  Left Kidney:  Length: 12 cm. Echogenicity within normal limits. No mass or hydronephrosis visualized.  Abdominal aorta:  The aortic bifurcation is not visualized. Otherwise, no evidence of aneurysm.  Other findings:  None.  IMPRESSION: Cholelithiasis without acute cholecystitis.   Electronically Signed   By: Jorje Guild M.D.   On: 01/30/2014 09:04   Ct Maxillofacial Wo Cm  01/29/2014   CLINICAL DATA:  Status post fall  EXAM: CT HEAD WITHOUT CONTRAST  CT MAXILLOFACIAL WITHOUT CONTRAST  TECHNIQUE: Multidetector CT imaging of the head and maxillofacial structures were performed using the standard protocol without intravenous contrast. Multiplanar CT image reconstructions of the maxillofacial structures were also generated.  COMPARISON:  10/26/2009  FINDINGS: CT HEAD FINDINGS  No acute cortical infarct, hemorrhage, or mass lesion ispresent. Ventricles are of normal size. No significant extra-axial fluid collection is present. The paranasal sinuses andmastoid air cells are clear. The osseous skull is  intact.  CT MAXILLOFACIAL FINDINGS  Comminuted fracture involves the left mandibular condyle. Mild ventral angulation of the distal fracture fragments. No dislocation. The remaining facial bones appear intact.  IMPRESSION: 1. No acute intracranial abnormalities. 2. Acute fracture of the left mandibular condyle.   Electronically Signed  By: Kerby Moors M.D.   On: 01/29/2014 18:54    Microbiology: No results found for this or any previous visit (from the past 240 hour(s)).   Labs: Basic Metabolic Panel:  Recent Labs Lab 01/29/14 1717 01/30/14 0422 01/31/14 0426 02/01/14 0625  NA 132* 135* 135* 139  K 3.5* 3.1* 4.2 3.8  CL 90* 95* 97 101  CO2 '24 23 22 26  ' GLUCOSE 160* 120* 333* 205*  BUN 55* 46* 27* 15  CREATININE 5.38* 4.04* 2.50* 1.73*  CALCIUM 9.8 9.7 9.2 9.2  MG  --  2.4  --   --   PHOS  --  4.7* 3.3  --    Liver Function Tests:  Recent Labs Lab 01/29/14 1717 01/30/14 0422 01/31/14 0426 02/01/14 0625  AST 94* 94*  --  94*  ALT 38* 40*  --  58*  ALKPHOS 134* 122*  --  126*  BILITOT 0.3 0.3  --  0.2*  PROT 8.2 7.4  --  6.7  ALBUMIN 3.3* 3.1* 2.8* 2.8*   No results found for this basename: LIPASE, AMYLASE,  in the last 168 hours No results found for this basename: AMMONIA,  in the last 168 hours CBC:  Recent Labs Lab 01/29/14 1717 01/30/14 0422  WBC 6.1 5.1  NEUTROABS 3.0  --   HGB 12.0 11.5*  HCT 34.4* 33.8*  MCV 87.8 88.7  PLT 187 179   Cardiac Enzymes:  Recent Labs Lab 02/01/14 1755  CKTOTAL 48   BNP: BNP (last 3 results) No results found for this basename: PROBNP,  in the last 8760 hours CBG:  Recent Labs Lab 02/01/14 0835 02/01/14 1146 02/01/14 1655 02/01/14 2117 02/02/14 0745  GLUCAP 185* 343* 321* 201* 185*       Signed:  Regalado, Belkys A  [facility] 02/02/2014, 10:57 AM

## 2014-02-03 LAB — ANTI-SMOOTH MUSCLE ANTIBODY, IGG: F-ACTIN AB IGG: 6 U (ref <20)

## 2014-02-12 ENCOUNTER — Ambulatory Visit: Payer: Medicare Other | Admitting: *Deleted

## 2014-02-16 ENCOUNTER — Ambulatory Visit (INDEPENDENT_AMBULATORY_CARE_PROVIDER_SITE_OTHER): Payer: Medicare Other | Admitting: Nurse Practitioner

## 2014-02-16 ENCOUNTER — Encounter: Payer: Self-pay | Admitting: Nurse Practitioner

## 2014-02-16 ENCOUNTER — Other Ambulatory Visit (INDEPENDENT_AMBULATORY_CARE_PROVIDER_SITE_OTHER): Payer: Medicare Other

## 2014-02-16 VITALS — BP 148/82 | HR 70 | Ht 64.0 in | Wt 187.6 lb

## 2014-02-16 DIAGNOSIS — R945 Abnormal results of liver function studies: Principal | ICD-10-CM

## 2014-02-16 DIAGNOSIS — R7989 Other specified abnormal findings of blood chemistry: Secondary | ICD-10-CM

## 2014-02-16 DIAGNOSIS — Z1211 Encounter for screening for malignant neoplasm of colon: Secondary | ICD-10-CM

## 2014-02-16 LAB — HEPATIC FUNCTION PANEL
ALT: 185 U/L — ABNORMAL HIGH (ref 0–35)
AST: 195 U/L — AB (ref 0–37)
Albumin: 4 g/dL (ref 3.5–5.2)
Alkaline Phosphatase: 193 U/L — ABNORMAL HIGH (ref 39–117)
BILIRUBIN TOTAL: 0.9 mg/dL (ref 0.2–1.2)
Bilirubin, Direct: 0.3 mg/dL (ref 0.0–0.3)
Total Protein: 8.7 g/dL — ABNORMAL HIGH (ref 6.0–8.3)

## 2014-02-16 MED ORDER — NA SULFATE-K SULFATE-MG SULF 17.5-3.13-1.6 GM/177ML PO SOLN: ORAL | Status: DC

## 2014-02-16 NOTE — Patient Instructions (Addendum)
You have been given a separate informational sheet regarding your tobacco use, the importance of quitting and local resources to help you quit.  You have been scheduled for a colonoscopy with Dr. Carlean Purl. Please follow written instructions given to you at your visit today.  Please pick up your prep kit at the pharmacy within the next 1-3 days. If you use inhalers (even only as needed), please bring them with you on the day of your procedure. Your physician has requested that you go to www.startemmi.com and enter the access code given to you at your visit today. This web site gives a general overview about your procedure. However, you should still follow specific instructions given to you by our office regarding your preparation for the procedure.  Your physician has requested that you go to the basement for the following lab work before leaving today: Alpha 1  Anti Smooth Muscle/Mitoch Ceruloplasmin LFTs

## 2014-02-18 ENCOUNTER — Encounter: Payer: Self-pay | Admitting: Nurse Practitioner

## 2014-02-18 DIAGNOSIS — Z1211 Encounter for screening for malignant neoplasm of colon: Secondary | ICD-10-CM | POA: Insufficient documentation

## 2014-02-18 LAB — ANTI-SMOOTH MUSCLE/MITOCHOND.
MITOCHONDRIAL AB: 5.5 U (ref 0.0–20.0)
Smooth Muscle Ab: 8 Units (ref 0–19)

## 2014-02-18 LAB — ALPHA-1-ANTITRYPSIN: A-1 Antitrypsin, Ser: 167 mg/dL (ref 83–199)

## 2014-02-18 LAB — CERULOPLASMIN: Ceruloplasmin: 38 mg/dL (ref 18–53)

## 2014-02-18 NOTE — Progress Notes (Signed)
     History of Present Illness:   Patient is a 53 year old black female who we met in the hospital earlier this month. Patient had fallen at home a few days prior and since the fall had been having intermittent nausea / vomiting. Patient was in acute renal failure with creatinine of 5.3 on admission. LFTs were elevated but this was more of chronic problem.  Ultrasound showed one gallstone, no gb thickening, normal CBD.   Patient has no knowledge of prior abnormal LFTs. No abdominal pain. Denies significant ETOH use.   Current Medications, Allergies, Past Medical History, Past Surgical History, Family History and Social History were reviewed in [facility] electronic medical record.   Physical Exam: General: Pleasant, well developed , black female in no acute distress Head: Normocephalic and atraumatic Eyes:  sclerae anicteric, conjunctiva pink  Ears: Normal auditory acuity Lungs: Clear throughout to auscultation Heart: Regular rate and rhythm Abdomen: Soft, non distended, non-tender. No masses, no hepatomegaly. Normal bowel sounds Musculoskeletal: Symmetrical with no gross deformities  Extremities: No edema  Neurological: Alert oriented x 4, grossly nonfocal Psychological:  Alert and cooperative. Normal mood and affect  Assessment and Recommendations:   1. 53 year old female with abnormal LFTs, mixed pattern.They were abnormal in 2010 and during recent hospitalization. Hep A,B,and C negative. ANA negative. Will obtain additional labs for other autoimmune /genetic liver disease. Will also repeat LFTs. Will also need to look closely at her medication. U/S shows normal except for gallstones. No  CBD dilation.  If labs unrevealing she may need eventual liver biopsy  2. Colon cancer screening. The risks, benefits, and alternatives to colonoscopy with possible biopsy and possible polypectomy were discussed with the patient and she consents to proceed.   3. Illicit drug use.Urine tox  positive for cocaine during recent admission.              

## 2014-02-22 ENCOUNTER — Encounter: Payer: Self-pay | Admitting: *Deleted

## 2014-02-23 NOTE — Progress Notes (Signed)
Agree with Ms. Guenther's assessment and plan. Gatha Mayer, MD, [facility]  Additional serologic evaluation unrevealing.  LFT's worse and chronically elevated.  I recommend a liver biopsy. She has been difficult to reach - I know a letter has been mailed. Has colonoscopy 9/8 Needs REV me before then (or Nevin Bloodgood) to discuss and arrange liver bx. Repeat LFT's and PT/INR also

## 2014-02-25 ENCOUNTER — Encounter: Payer: Self-pay | Admitting: *Deleted

## 2014-02-25 ENCOUNTER — Other Ambulatory Visit: Payer: Self-pay | Admitting: *Deleted

## 2014-02-25 DIAGNOSIS — R7989 Other specified abnormal findings of blood chemistry: Secondary | ICD-10-CM

## 2014-02-25 DIAGNOSIS — R945 Abnormal results of liver function studies: Secondary | ICD-10-CM

## 2014-03-15 ENCOUNTER — Telehealth: Payer: Self-pay | Admitting: *Deleted

## 2014-03-15 NOTE — Telephone Encounter (Signed)
Message copied by Hulan Saas on Mon Mar 15, 2014  2:34 PM ------      Message from: Hulan Saas      Created: Thu Feb 25, 2014  2:35 PM       Did patient call for lab results and to set up liver bx as per Nevin Bloodgood. (letter mailed) ------

## 2014-03-15 NOTE — Telephone Encounter (Signed)
Carol [name], just letting you know I could not reach this patient by phone. I mailed her a letter requesting she call us about her results on 02/25/14. She has not called [place].

## 2014-04-27 ENCOUNTER — Telehealth: Payer: Self-pay | Admitting: Internal Medicine

## 2014-04-27 ENCOUNTER — Encounter: Payer: Medicare Other | Admitting: Internal Medicine

## 2014-04-27 NOTE — Telephone Encounter (Signed)
No - I want her to schedule an office visit to see me instead of doing the colonoscopy - will then reschedule it

## 2014-05-12 NOTE — Telephone Encounter (Signed)
Pt's home phone is disconnected.  Contacted her Sister Shirlean Mylar who is listed as an emergency contact and she said she would get in touch with her sister Carol [name] to [facility] to resch'd her procedure to an OV.

## 2014-05-24 ENCOUNTER — Encounter: Payer: Self-pay | Admitting: Internal Medicine

## 2014-05-24 NOTE — Telephone Encounter (Signed)
Pt's phone number is still disconnected.  Called and left message on sister's vmail to return call.  She is listed as an emergency contact for the patient. Also going to mail Pt letter today to call the office

## 2014-06-23 ENCOUNTER — Encounter: Payer: Medicare Other | Admitting: Internal Medicine

## 2014-07-30 ENCOUNTER — Encounter: Payer: Self-pay | Admitting: Internal Medicine

## 2014-07-30 ENCOUNTER — Other Ambulatory Visit (INDEPENDENT_AMBULATORY_CARE_PROVIDER_SITE_OTHER): Payer: Medicare Other

## 2014-07-30 ENCOUNTER — Ambulatory Visit (INDEPENDENT_AMBULATORY_CARE_PROVIDER_SITE_OTHER): Payer: Medicare Other | Admitting: Internal Medicine

## 2014-07-30 VITALS — BP 128/78 | HR 88 | Ht 64.0 in | Wt 189.5 lb

## 2014-07-30 DIAGNOSIS — R109 Unspecified abdominal pain: Secondary | ICD-10-CM

## 2014-07-30 DIAGNOSIS — R769 Abnormal immunological finding in serum, unspecified: Secondary | ICD-10-CM

## 2014-07-30 DIAGNOSIS — R778 Other specified abnormalities of plasma proteins: Secondary | ICD-10-CM

## 2014-07-30 DIAGNOSIS — R748 Abnormal levels of other serum enzymes: Secondary | ICD-10-CM

## 2014-07-30 DIAGNOSIS — Z5181 Encounter for therapeutic drug level monitoring: Secondary | ICD-10-CM | POA: Diagnosis not present

## 2014-07-30 DIAGNOSIS — R740 Nonspecific elevation of levels of transaminase and lactic acid dehydrogenase [LDH]: Secondary | ICD-10-CM | POA: Diagnosis not present

## 2014-07-30 LAB — COMPREHENSIVE METABOLIC PANEL
ALBUMIN: 3.4 g/dL — AB (ref 3.5–5.2)
ALT: 29 U/L (ref 0–35)
AST: 44 U/L — AB (ref 0–37)
Alkaline Phosphatase: 281 U/L — ABNORMAL HIGH (ref 39–117)
BUN: 16 mg/dL (ref 6–23)
CO2: 27 mEq/L (ref 19–32)
Calcium: 9.4 mg/dL (ref 8.4–10.5)
Chloride: 88 mEq/L — ABNORMAL LOW (ref 96–112)
Creatinine, Ser: 1.2 mg/dL (ref 0.4–1.2)
GFR: 61.9 mL/min (ref >60.00)
Glucose, Bld: 721 mg/dL (ref 70–99)
POTASSIUM: 4.2 meq/L (ref 3.5–5.1)
Sodium: 124 mEq/L — ABNORMAL LOW (ref 135–145)
Total Bilirubin: 0.9 mg/dL (ref 0.2–1.2)
Total Protein: 7.7 g/dL (ref 6.0–8.3)

## 2014-07-30 LAB — CBC WITH DIFFERENTIAL/PLATELET
Basophils Absolute: 0 10*3/uL (ref 0.0–0.1)
Basophils Relative: 0.5 % (ref 0.0–3.0)
EOS ABS: 0.1 10*3/uL (ref 0.0–0.7)
Eosinophils Relative: 3.1 % (ref 0.0–5.0)
HCT: 38 % (ref 36.0–46.0)
Hemoglobin: 12.6 g/dL (ref 12.0–15.0)
Lymphocytes Relative: 31.9 % (ref 12.0–46.0)
Lymphs Abs: 1.4 10*3/uL (ref 0.7–4.0)
MCHC: 33.1 g/dL (ref 30.0–36.0)
MCV: 91.8 fl (ref 78.0–100.0)
Monocytes Absolute: 0.6 10*3/uL (ref 0.1–1.0)
Monocytes Relative: 12.7 % — ABNORMAL HIGH (ref 3.0–12.0)
Neutro Abs: 2.3 10*3/uL (ref 1.4–7.7)
Neutrophils Relative %: 51.8 % (ref 43.0–77.0)
PLATELETS: 171 10*3/uL (ref 150.0–400.0)
RBC: 4.14 Mil/uL (ref 3.87–5.11)
RDW: 13 % (ref 11.5–15.5)
WBC: 4.5 10*3/uL (ref 4.0–10.5)

## 2014-07-30 LAB — PROTIME-INR
INR: 0.9 ratio (ref 0.8–1.0)
Prothrombin Time: 10 s (ref 9.6–13.1)

## 2014-07-30 MED ORDER — DICYCLOMINE HCL 20 MG PO TABS: 20.0000 mg | ORAL_TABLET | Freq: Four times a day (QID) | ORAL | Status: DC

## 2014-07-30 NOTE — Progress Notes (Signed)
Quick Note:  Needs to go to ED Blood sugar 721 ______

## 2014-07-30 NOTE — Progress Notes (Signed)
Subjective:    Patient ID: Carol [name], female    DOB: [birth year], 53 y.o.   MRN: [id number]  HPI This lady is here for follow-up of abnormal transaminases. She has an abnormal alkaline phosphatase as well. She had inconclusive abnormalities of the serum protein electrophoresis and urine protein electrophoresis. She was contacted but didn't have a phone and was lost to follow-up for several months, we had recommended a liver biopsy but she has not yet had that done. A screening colonoscopy was also recommended but postponed. She is here for follow-up today without new complaints although she has some occasional crampy abdominal pain which may be a new issue. Denies myalgias. Of note she is not a drinker, she did have a brother died from cirrhosis but he was a heavy drinker, her infectious hepatitis screening was negative as well as her other autoimmune serologic workup. She is ready to reschedule liver biopsy.  She had originally presented to [place] in June when she was hospitalized, she had presented with nausea vomiting and dehydration and acute renal failure. Allergies  Allergen Reactions  . Penicillins     Really bad yeast infection   Outpatient Prescriptions Prior to Visit  Medication Sig Dispense Refill  . gabapentin (NEURONTIN) 100 MG capsule Take 1 capsule (100 mg total) by mouth at bedtime. 30 capsule 0  . insulin aspart (NOVOLOG) 100 UNIT/ML injection Inject 1-12 Units into the skin 3 (three) times daily with meals. Sliding scale    . insulin glargine (LANTUS) 100 UNIT/ML injection Inject 0.25 mLs (25 Units total) into the skin daily. 10 mL 11  . Na Sulfate-K Sulfate-Mg Sulf (SUPREP BOWEL PREP) SOLN USE PER PREP INSTRUCTIONS 1 Bottle 0  . ondansetron (ZOFRAN) 4 MG/2ML SOLN injection Inject 2 mLs (4 mg total) into the vein every 6 (six) hours as needed for nausea or vomiting. 2 mL 0  . pantoprazole (PROTONIX) 40 MG tablet Take 1 tablet (40 mg total) by mouth daily at 12 noon. 30  tablet 0  . QUEtiapine Fumarate (SEROQUEL XR) 150 MG 24 hr tablet Take 150 mg by mouth at bedtime.       No facility-administered medications prior to visit.   Past Medical History  Diagnosis Date  . Hypertension   . Diabetes mellitus   . Depression   . Chronic back pain   . Palpitations   . Abnormal liver function tests   . Acute kidney injury 01/2014    Hospitalized, Volume depletion, nausea and vomiting   Past Surgical History  Procedure Laterality Date  . Carpal tunnel release     History   Social History  . Marital Status: Single    Spouse Name: N/A    Number of Children: 3  . Years of Education: N/A   Social History Main Topics  . Smoking status: Current Some Day Smoker -- 0.50 packs/day for 3 years    Types: Cigarettes  . Smokeless tobacco: None  . Alcohol Use: 0.0 oz/week    0 Not specified per week     Comment: occasional  . Drug Use: No  . Sexual Activity: None   Other Topics Concern  . None   Social History Narrative         Review of Systems Chronic numbness and pain in right lower extremity    Objective:   Physical Exam General:  NAD Eyes:   anicteric Lungs:  clear Heart:  S1S2 no rubs, murmurs or gallops Abdomen:  soft and nontender, BS+, no  hsm/mass Ext:   no edema  Data Reviewed:  Labs in EMR GI note 6 and 02/2014 [place] abd hospital notes    Assessment & Plan:  Abnormal transaminases - Plan: Serum protein electrophoresis with reflex, Protein electrophoresis, urine, CBC w/Diff, Comp Met (CMET), INR/PT, IgG, US Guided Needle Placement  Abnormal alkaline phosphatase test - Plan: Serum protein electrophoresis with reflex, Protein electrophoresis, urine, CBC w/Diff, Comp Met (CMET), INR/PT, IgG, US Guided Needle Placement  Abnormal serum protein electrophoresis - Plan: Serum protein electrophoresis with reflex, Protein electrophoresis, urine, CBC w/Diff, Comp Met (CMET), INR/PT, IgG  Abdominal cramps   1) Needs repeat labs as listed  above to investigate lab abnormalities - LFT's, protein electrophoreses. 2) Schedule US guided liver bx - risks benefits and indications discussed  3) screening colonoscopy at a later date 4) dicyclomine when necessary abdominal cramps  I appreciate the opportunity to care for this patient. CC: Carol Fendt, MD

## 2014-07-30 NOTE — Patient Instructions (Addendum)
Your physician has requested that you go to the basement for lab work before leaving today.  We have sent the following medications to your pharmacy for you to pick up at your convenience: Generic bentyl for spasms/pain.   You will be contacted by [facility] to set up the liver biospy.  They can be reached at [phone].  You will need a colonoscopy in the future but we want to get the liver issues taken care of first.   I appreciate the opportunity to care for you.

## 2014-07-31 LAB — IGG: IgG (Immunoglobin G), Serum: 1520 mg/dL (ref 690–1700)

## 2014-08-03 LAB — UIFE/LIGHT CHAINS/TP QN, 24-HR UR
ALBUMIN, U: DETECTED
ALPHA 2 UR: DETECTED — AB
Alpha 1, Urine: DETECTED — AB
BETA UR: DETECTED — AB
Gamma Globulin, Urine: DETECTED — AB
Total Protein, Urine: <4 mg/dL — ABNORMAL LOW (ref 5–24)

## 2014-08-03 LAB — PROTEIN ELECTROPHORESIS, SERUM, WITH REFLEX
ALBUMIN ELP: 48.9 % — AB (ref 55.8–66.1)
ALPHA-2-GLOBULIN: 11 % (ref 7.1–11.8)
Alpha-1-Globulin: 4.6 % (ref 2.9–4.9)
BETA GLOBULIN: 6.7 % (ref 4.7–7.2)
Beta 2: 7.8 % — ABNORMAL HIGH (ref 3.2–6.5)
Gamma Globulin: 21 % — ABNORMAL HIGH (ref 11.1–18.8)
Total Protein, Serum Electrophoresis: 8 g/dL (ref 6.0–8.3)

## 2014-08-05 ENCOUNTER — Telehealth: Payer: Self-pay | Admitting: Internal Medicine

## 2014-08-05 NOTE — Telephone Encounter (Signed)
I spoke with the patient and explained the liver biopsy to her.  All of her questions were answered.  She will call back for any additional questions or concerns

## 2014-08-05 NOTE — Progress Notes (Signed)
Quick Note:  Awaiting liver bx results ______

## 2014-08-06 ENCOUNTER — Telehealth: Payer: Self-pay | Admitting: Internal Medicine

## 2014-08-06 ENCOUNTER — Other Ambulatory Visit: Payer: Self-pay | Admitting: Radiology

## 2014-08-06 NOTE — Telephone Encounter (Signed)
Patient states she is having a lot of pain in her legs, stomach, and back.  She states that the dicyclomine is not helping and requesting something else for pain.  Please advise

## 2014-08-09 ENCOUNTER — Other Ambulatory Visit: Payer: Self-pay | Admitting: Radiology

## 2014-08-10 ENCOUNTER — Ambulatory Visit (HOSPITAL_COMMUNITY): Admission: RE | Admit: 2014-08-10 | Payer: Medicare Other | Source: Ambulatory Visit

## 2014-08-10 NOTE — Telephone Encounter (Signed)
Looks like she did not show for pre-procedure visit (presumably prior to 1/5 liver biopsy)  Please check this out   She should see her PCP in my absence re: pain medication

## 2014-08-11 NOTE — Telephone Encounter (Signed)
Patient notified She has pre-op on 1/5 prior to the biopsy

## 2014-08-22 ENCOUNTER — Other Ambulatory Visit: Payer: Self-pay | Admitting: Radiology

## 2014-08-24 ENCOUNTER — Encounter (HOSPITAL_COMMUNITY): Payer: Self-pay

## 2014-08-24 ENCOUNTER — Ambulatory Visit (HOSPITAL_COMMUNITY)
Admission: RE | Admit: 2014-08-24 | Discharge: 2014-08-24 | Disposition: A | Discharge status: Home / Self Care | Payer: Medicare Other | Source: Ambulatory Visit | Attending: Internal Medicine | Admitting: Internal Medicine

## 2014-08-24 DIAGNOSIS — Z794 Long term (current) use of insulin: Secondary | ICD-10-CM | POA: Insufficient documentation

## 2014-08-24 DIAGNOSIS — F329 Major depressive disorder, single episode, unspecified: Secondary | ICD-10-CM | POA: Insufficient documentation

## 2014-08-24 DIAGNOSIS — I10 Essential (primary) hypertension: Secondary | ICD-10-CM | POA: Diagnosis not present

## 2014-08-24 DIAGNOSIS — R740 Nonspecific elevation of levels of transaminase and lactic acid dehydrogenase [LDH]: Secondary | ICD-10-CM | POA: Diagnosis not present

## 2014-08-24 DIAGNOSIS — G8929 Other chronic pain: Secondary | ICD-10-CM | POA: Insufficient documentation

## 2014-08-24 DIAGNOSIS — R748 Abnormal levels of other serum enzymes: Secondary | ICD-10-CM

## 2014-08-24 DIAGNOSIS — Z79899 Other long term (current) drug therapy: Secondary | ICD-10-CM | POA: Insufficient documentation

## 2014-08-24 DIAGNOSIS — E119 Type 2 diabetes mellitus without complications: Secondary | ICD-10-CM | POA: Diagnosis not present

## 2014-08-24 DIAGNOSIS — R002 Palpitations: Secondary | ICD-10-CM | POA: Insufficient documentation

## 2014-08-24 DIAGNOSIS — M549 Dorsalgia, unspecified: Secondary | ICD-10-CM | POA: Diagnosis not present

## 2014-08-24 DIAGNOSIS — F17210 Nicotine dependence, cigarettes, uncomplicated: Secondary | ICD-10-CM | POA: Diagnosis not present

## 2014-08-24 DIAGNOSIS — Z6829 Body mass index (BMI) 29.0-29.9, adult: Secondary | ICD-10-CM | POA: Insufficient documentation

## 2014-08-24 LAB — CBC
HCT: 37.9 % (ref 36.0–46.0)
HEMOGLOBIN: 13.1 g/dL (ref 12.0–15.0)
MCH: 31 pg (ref 26.0–34.0)
MCHC: 34.6 g/dL (ref 30.0–36.0)
MCV: 89.8 fL (ref 78.0–100.0)
PLATELETS: 178 10*3/uL (ref 150–400)
RBC: 4.22 MIL/uL (ref 3.87–5.11)
RDW: 13 % (ref 11.5–15.5)
WBC: 5.3 10*3/uL (ref 4.0–10.5)

## 2014-08-24 LAB — APTT: aPTT: 26 seconds (ref 24–37)

## 2014-08-24 LAB — PROTIME-INR
INR: 1.01 (ref 0.00–1.49)
Prothrombin Time: 13.4 seconds (ref 11.6–15.2)

## 2014-08-24 LAB — GLUCOSE, CAPILLARY
Glucose-Capillary: 303 mg/dL — ABNORMAL HIGH (ref 70–99)
Glucose-Capillary: 282 mg/dL — ABNORMAL HIGH (ref 70–99)
Glucose-Capillary: 214 mg/dL — ABNORMAL HIGH (ref 70–99)

## 2014-08-24 MED ORDER — FENTANYL CITRATE 0.05 MG/ML IJ SOLN
INTRAMUSCULAR | Status: AC | PRN
  Administered 2014-08-24 (×2): 25 ug via INTRAVENOUS

## 2014-08-24 MED ORDER — MIDAZOLAM HCL 2 MG/2ML IJ SOLN
INTRAMUSCULAR | Status: AC
  Filled 2014-08-24: qty 6

## 2014-08-24 MED ORDER — FENTANYL CITRATE 0.05 MG/ML IJ SOLN
INTRAMUSCULAR | Status: AC
  Filled 2014-08-24: qty 4

## 2014-08-24 MED ORDER — NALOXONE HCL 0.4 MG/ML IJ SOLN
INTRAMUSCULAR | Status: AC
  Filled 2014-08-24: qty 1

## 2014-08-24 MED ORDER — SODIUM CHLORIDE 0.9 % IV SOLN
INTRAVENOUS | Status: DC
  Administered 2014-08-24: 11:00:00 via INTRAVENOUS

## 2014-08-24 MED ORDER — FLUMAZENIL 0.5 MG/5ML IV SOLN
INTRAVENOUS | Status: AC
  Filled 2014-08-24: qty 5

## 2014-08-24 MED ORDER — HYDROCODONE-ACETAMINOPHEN 5-325 MG PO TABS
1.0000 | ORAL_TABLET | ORAL | Status: AC
  Administered 2014-08-24: 1 via ORAL
  Filled 2014-08-24 (×2): qty 1

## 2014-08-24 MED ORDER — MIDAZOLAM HCL 2 MG/2ML IJ SOLN
INTRAMUSCULAR | Status: AC | PRN
  Administered 2014-08-24 (×2): 1 mg via INTRAVENOUS

## 2014-08-24 MED ORDER — INSULIN ASPART 100 UNIT/ML ~~LOC~~ SOLN
5.0000 [IU] | Freq: Once | SUBCUTANEOUS | Status: AC
  Administered 2014-08-24: 5 [IU] via SUBCUTANEOUS
  Filled 2014-08-24: qty 1
  Filled 2014-08-24: qty 0.05

## 2014-08-24 NOTE — Progress Notes (Signed)
Dr Earleen Newport here to see patient. Vicodin ordered for pain

## 2014-08-24 NOTE — Discharge Instructions (Signed)
Liver Biopsy, Care After °Refer to this sheet in the next few weeks. These instructions provide you with information on caring for yourself after your procedure. Your health care provider may also give you more specific instructions. Your treatment has been planned according to current medical practices, but problems sometimes occur. Call your health care provider if you have any problems or questions after your procedure. °WHAT TO EXPECT AFTER THE PROCEDURE °After your procedure, it is typical to have the following: °· A small amount of discomfort in the area where the biopsy was done and in the right shoulder or shoulder blade. °· A small amount of bruising around the area where the biopsy was done and on the skin over the liver. °· Sleepiness and fatigue for the rest of the day. °HOME CARE INSTRUCTIONS  °· Rest at home for 1-2 days or as directed by your health care provider. °· Have a friend or family member stay with you for at least 24 hours. °· Because of the medicines used during the procedure, you should not do the following things in the first 24 hours: °¨ Drive. °¨ Use machinery. °¨ Be responsible for the care of other people. °¨ Sign legal documents. °¨ Take a bath or shower. °· There are many different ways to close and cover an incision, including stitches, skin glue, and adhesive strips. Follow your health care provider's instructions on: °¨ Incision care. °¨ Bandage (dressing) changes and removal. °¨ Incision closure removal. °· Do not drink alcohol in the first week. °· Do not lift more than 5 pounds or play contact sports for 2 weeks after this test. °· Take medicines only as directed by your health care provider. Do not take medicine containing aspirin or non-steroidal anti-inflammatory medicines such as ibuprofen for 1 week after this test. °· It is your responsibility to get your test results. °SEEK MEDICAL CARE IF:  °· You have increased bleeding from an incision that results in more than a  small spot of blood. °· You have redness, swelling, or increasing pain in any incisions. °· You notice a discharge or a bad smell coming from any of your incisions. °· You have a fever or chills. °SEEK IMMEDIATE MEDICAL CARE IF:  °· You develop swelling, bloating, or pain in your abdomen. °· You become dizzy or faint. °· You develop a rash. °· You are nauseous or vomit. °· You have difficulty breathing, feel short of breath, or feel faint. °· You develop chest pain. °· You have problems with your speech or vision. °· You have trouble balancing or moving your arms or legs. °Document Released: 02/23/2005 Document Revised: 12/21/2013 Document Reviewed: 10/02/2013 °ExitCare® Patient Information ©2015 [employer]. This information is not intended to replace advice given to you by your health care provider. Make sure you discuss any questions you have with your health care provider. °Conscious Sedation °Sedation is the use of medicines to promote relaxation and relieve discomfort and anxiety. Conscious sedation is a type of sedation. Under conscious sedation you are less alert than normal but are still able to respond to instructions or stimulation. Conscious sedation is used during short medical and dental procedures. It is milder than deep sedation or general anesthesia and allows you to return to your regular activities sooner.  °LET YOUR HEALTH CARE PROVIDER KNOW ABOUT:  °· Any allergies you have. °· All medicines you are taking, including vitamins, herbs, eye drops, creams, and over-the-counter medicines. °· Use of steroids (by mouth or creams). °·   Previous problems you or members of your family have had with the use of anesthetics. °· Any blood disorders you have. °· Previous surgeries you have had. °· Medical conditions you have. °· Possibility of pregnancy, if this applies. °· Use of cigarettes, alcohol, or illegal drugs. °RISKS AND COMPLICATIONS °Generally, this is a safe procedure. However, as with any  procedure, problems can occur. Possible problems include: °· Oversedation. °· Trouble breathing on your own. You may need to have a breathing tube until you are awake and breathing on your own. °· Allergic reaction to any of the medicines used for the procedure. °BEFORE THE PROCEDURE °· You may have blood tests done. These tests can help show how well your kidneys and liver are working. They can also show how well your blood clots. °· A physical exam will be done.   °· Only take medicines as directed by your health care provider. You may need to stop taking medicines (such as blood thinners, aspirin, or nonsteroidal anti-inflammatory drugs) before the procedure.   °· Do not eat or drink at least 6 hours before the procedure or as directed by your health care provider. °· Arrange for a responsible adult, family member, or friend to take you home after the procedure. He or she should stay with you for at least 24 hours after the procedure, until the medicine has worn off. °PROCEDURE  °· An intravenous (IV) catheter will be inserted into one of your veins. Medicine will be able to flow directly into your body through this catheter. You may be given medicine through this tube to help prevent pain and help you relax. °· The medical or dental procedure will be done. °AFTER THE PROCEDURE °· You will stay in a recovery area until the medicine has worn off. Your blood pressure and pulse will be checked.   °·  Depending on the procedure you had, you may be allowed to go home when you can tolerate liquids and your pain is under control. °Document Released: 05/01/2001 Document Revised: 08/11/2013 Document Reviewed: 04/13/2013 °ExitCare® Patient Information ©2015 [employer]. This information is not intended to replace advice given to you by your health care provider. Make sure you discuss any questions you have with your health care provider. ° °

## 2014-08-24 NOTE — Progress Notes (Signed)
Patient CBG was 303 upon admission to short stay. States she last took insulin at 1530 yesterday (1 unit Novolg and 50 u Lantus). Called Lennette Bihari Allred PA and order received for 5 units of Novolog insulin at this time pre procedure.

## 2014-08-24 NOTE — H&P (Signed)
Chief Complaint: "I'm having a liver biopsy"  Referring Physician(s): Gessner,Carl E  History of Present Illness: Carol [name] is a 54 y.o. female with history of elevated LFT's who presents today for US guided random liver biopsy.   Past Medical History  Diagnosis Date  . Hypertension   . Diabetes mellitus   . Depression   . Chronic back pain   . Palpitations   . Abnormal liver function tests   . Acute kidney injury 01/2014    Hospitalized, Volume depletion, nausea and vomiting    Past Surgical History  Procedure Laterality Date  . Carpal tunnel release      Allergies: Penicillins  Medications: Prior to Admission medications   Medication Sig Start Date End Date Taking? Authorizing Provider  dicyclomine (BENTYL) 20 MG tablet Take 1 tablet (20 mg total) by mouth every 6 (six) hours. Patient taking differently: Take 20 mg by mouth every 6 (six) hours as needed for spasms.  07/30/14   Gatha Mayer, MD  diphenhydramine-acetaminophen (TYLENOL PM) 25-500 MG TABS Take 0.5 tablets by mouth at bedtime as needed (For sleeping disorder.).    Historical Provider, MD  gabapentin (NEURONTIN) 100 MG capsule Take 1 capsule (100 mg total) by mouth at bedtime. 02/02/14   Belkys A Regalado, MD  insulin aspart (NOVOLOG) 100 UNIT/ML injection Inject 1-12 Units into the skin 3 (three) times daily with meals. Sliding scale    Historical Provider, MD  insulin glargine (LANTUS) 100 UNIT/ML injection Inject 0.25 mLs (25 Units total) into the skin daily. Patient taking differently: Inject 50 Units into the skin every evening.  02/02/14   Belkys A Regalado, MD  Na Sulfate-K Sulfate-Mg Sulf (SUPREP BOWEL PREP PO) Take 1 Bottle by mouth as directed.     Historical Provider, MD  Na Sulfate-K Sulfate-Mg Sulf (SUPREP BOWEL PREP) SOLN USE PER PREP INSTRUCTIONS 02/16/14   Willia Craze, NP  ondansetron [facility]) 4 MG/2ML SOLN injection Inject 2 mLs (4 mg total) into the vein every 6 (six) hours as  needed for nausea or vomiting. 02/02/14   Belkys A Regalado, MD  pantoprazole (PROTONIX) 40 MG tablet Take 1 tablet (40 mg total) by mouth daily at 12 noon. 02/02/14   Belkys A Regalado, MD  QUEtiapine Fumarate (SEROQUEL XR) 150 MG 24 hr tablet Take 150 mg by mouth at bedtime.      Historical Provider, MD  triamcinolone ointment (KENALOG) 0.1 % Apply 1 application topically 2 (two) times daily as needed (Applies to feet after showering for dry skin.).    Historical Provider, MD    Family History  Problem Relation Age of Onset  . Diabetes Mother   . Kidney disease Mother   . Seizures Father   . Hypertension Father   . Stroke Father   . Asthma Other     History   Social History  . Marital Status: Single    Spouse Name: N/A    Number of Children: 3  . Years of Education: N/A   Social History Main Topics  . Smoking status: Current Some Day Smoker -- 0.50 packs/day for 3 years    Types: Cigarettes  . Smokeless tobacco: None  . Alcohol Use: 0.0 oz/week    0 Not specified per week     Comment: occasional  . Drug Use: No  . Sexual Activity: None   Other Topics Concern  . None   Social History Narrative      Review of Systems  Constitutional: Negative  for fever and chills.  Respiratory: Negative for cough and shortness of breath.   Cardiovascular: Negative for chest pain.  Gastrointestinal: Negative for nausea, vomiting, abdominal pain and blood in stool.  Genitourinary: Negative for dysuria and hematuria.  Musculoskeletal: Negative for back pain.  Neurological: Negative for headaches.  Hematological: Does not bruise/bleed easily.    Vital Signs: BP 123/80 mmHg  Pulse 70  Temp(Src) 98.2 F (36.8 C) (Oral)  Resp 16  Ht 5\' 4"  (1.626 m)  Wt 170 lb (77.111 kg)  BMI 29.17 kg/m2  SpO2 100%  Physical Exam  Constitutional: She is oriented to person, place, and time. She appears well-developed and well-nourished.  Cardiovascular: Normal rate and regular rhythm.     Pulmonary/Chest: Effort normal and breath sounds normal.  Abdominal: Soft. Bowel sounds are normal. There is no tenderness.  Musculoskeletal: Normal range of motion. She exhibits no edema.  Neurological: She is alert and oriented to person, place, and time.    Imaging: No results found.  Labs:  CBC:  Recent Labs  01/29/14 1717 01/30/14 0422 07/30/14 1038 08/24/14 1120  WBC 6.1 5.1 4.5 5.3  HGB 12.0 11.5* 12.6 13.1  HCT 34.4* 33.8* 38.0 37.9  PLT 187 179 171.0 178    COAGS:  Recent Labs  01/30/14 0422 07/30/14 1038 08/24/14 1120  INR 1.06 0.9 1.01  APTT  --   --  26    BMP:  Recent Labs  01/29/14 1717 01/30/14 0422 01/31/14 0426 02/01/14 0625 07/30/14 1038  NA 132* 135* 135* 139 124*  K 3.5* 3.1* 4.2 3.8 4.2  CL 90* 95* 97 101 88*  CO2 24 23 22 26 27   GLUCOSE 160* 120* 333* 205* 721*  BUN 55* 46* 27* 15 16  CALCIUM 9.8 9.7 9.2 9.2 9.4  CREATININE 5.38* 4.04* 2.50* 1.73* 1.2  GFRNONAA 8* 12* 21* 33*  --   GFRAA 10* 14* 25* 38*  --     LIVER FUNCTION TESTS:  Recent Labs  01/30/14 0422 01/31/14 0426 02/01/14 0625 02/16/14 1150 07/30/14 1038  BILITOT 0.3  --  0.2* 0.9 0.9  AST 94*  --  94* 195* 44*  ALT 40*  --  58* 185* 29  ALKPHOS 122*  --  126* 193* 281*  PROT 7.4  --  6.7 8.7* 7.7  ALBUMIN 3.1* 2.8* 2.8* 4.0 3.4*    TUMOR MARKERS: No results for input(s): AFPTM, CEA, CA199, CHROMGRNA in the last 8760 hours.  Assessment and Plan: Carol [name] is a 55 y.o. female with history of elevated LFT's who presents today for US guided random liver biopsy. Details/risks of procedure d/w pt with her understanding and consent.      Signed: Autumn Messing 08/24/2014, 12:38 PM

## 2014-08-24 NOTE — Procedures (Signed)
Interventional Radiology Procedure Note  Procedure: US guided biopsy of liver. Medical liver biopsy.  3 x 18G core.  Complications: None Recommendations: - Bedrest supine x 3 hrs  - Follow biopsy results  Signed,  Dulcy Fanny. Earleen Newport, DO

## 2014-08-24 NOTE — Progress Notes (Signed)
Pt has been medicated for painPt has eaten cheese and crackers  2 sprites and hamburger that sister has brought her. Pt talking with sister and watching TV

## 2014-08-26 ENCOUNTER — Encounter: Payer: Self-pay | Admitting: Internal Medicine

## 2014-08-26 NOTE — Progress Notes (Signed)
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

## 2014-09-03 ENCOUNTER — Telehealth: Payer: Self-pay | Admitting: Internal Medicine

## 2014-09-03 NOTE — Telephone Encounter (Signed)
Patient advised that referral was faxed on Friday at 4:30 1/8.  Usually takes a few weeks to get an appt.  She is advised to give it a few more weeks.  She is aware that I will continue to check on the appt .  She is asked to call back for any additional questions or concerns

## 2014-09-21 ENCOUNTER — Telehealth: Payer: Self-pay | Admitting: Internal Medicine

## 2014-09-21 NOTE — Telephone Encounter (Signed)
I spoke with the patient and advised her that [facility] liver care does have her information/referral and they are working on it.  She is advised that they will be contacting her soon

## 2014-09-22 NOTE — Telephone Encounter (Signed)
Patient was scheduled with [facility] liver care for 10/05/14 10:30

## 2014-10-07 ENCOUNTER — Telehealth: Payer: Self-pay | Admitting: Internal Medicine

## 2014-10-07 NOTE — Telephone Encounter (Signed)
A user error has taken place.

## 2014-11-09 ENCOUNTER — Other Ambulatory Visit: Payer: Self-pay | Admitting: Nurse Practitioner

## 2014-11-09 ENCOUNTER — Ambulatory Visit
Admission: RE | Admit: 2014-11-09 | Discharge: 2014-11-09 | Disposition: A | Discharge status: Home / Self Care | Payer: Medicare Other | Source: Ambulatory Visit | Attending: Nurse Practitioner | Admitting: Nurse Practitioner

## 2014-11-09 DIAGNOSIS — R52 Pain, unspecified: Secondary | ICD-10-CM

## 2014-11-23 ENCOUNTER — Other Ambulatory Visit: Payer: Self-pay | Admitting: Internal Medicine

## 2014-11-23 DIAGNOSIS — N644 Mastodynia: Secondary | ICD-10-CM

## 2014-11-30 ENCOUNTER — Other Ambulatory Visit: Payer: Self-pay | Admitting: Internal Medicine

## 2014-11-30 DIAGNOSIS — N644 Mastodynia: Secondary | ICD-10-CM

## 2014-12-06 ENCOUNTER — Ambulatory Visit
Admission: RE | Admit: 2014-12-06 | Discharge: 2014-12-06 | Disposition: A | Discharge status: Home / Self Care | Payer: Medicare Other | Source: Ambulatory Visit | Attending: Internal Medicine | Admitting: Internal Medicine

## 2014-12-06 ENCOUNTER — Other Ambulatory Visit: Payer: Self-pay | Admitting: Internal Medicine

## 2014-12-06 DIAGNOSIS — N644 Mastodynia: Secondary | ICD-10-CM

## 2014-12-08 ENCOUNTER — Other Ambulatory Visit (HOSPITAL_COMMUNITY): Payer: Self-pay | Admitting: Obstetrics

## 2014-12-08 DIAGNOSIS — R102 Pelvic and perineal pain: Secondary | ICD-10-CM

## 2014-12-14 ENCOUNTER — Ambulatory Visit (HOSPITAL_COMMUNITY)
Admission: RE | Admit: 2014-12-14 | Discharge: 2014-12-14 | Disposition: A | Discharge status: Home / Self Care | Payer: Medicare Other | Source: Ambulatory Visit | Attending: Obstetrics | Admitting: Obstetrics

## 2014-12-14 DIAGNOSIS — R102 Pelvic and perineal pain: Secondary | ICD-10-CM | POA: Diagnosis present

## 2015-04-06 ENCOUNTER — Emergency Department (HOSPITAL_COMMUNITY)
Admission: EM | Admit: 2015-04-06 | Discharge: 2015-04-06 | Disposition: A | Discharge status: Home / Self Care | Payer: Medicare HMO | Attending: Emergency Medicine | Admitting: Emergency Medicine

## 2015-04-06 ENCOUNTER — Encounter (HOSPITAL_COMMUNITY): Payer: Self-pay | Admitting: *Deleted

## 2015-04-06 ENCOUNTER — Emergency Department (HOSPITAL_COMMUNITY): Payer: Medicare HMO

## 2015-04-06 DIAGNOSIS — Z79899 Other long term (current) drug therapy: Secondary | ICD-10-CM | POA: Insufficient documentation

## 2015-04-06 DIAGNOSIS — Z8719 Personal history of other diseases of the digestive system: Secondary | ICD-10-CM | POA: Diagnosis not present

## 2015-04-06 DIAGNOSIS — G8929 Other chronic pain: Secondary | ICD-10-CM | POA: Diagnosis not present

## 2015-04-06 DIAGNOSIS — I10 Essential (primary) hypertension: Secondary | ICD-10-CM | POA: Insufficient documentation

## 2015-04-06 DIAGNOSIS — N179 Acute kidney failure, unspecified: Secondary | ICD-10-CM | POA: Insufficient documentation

## 2015-04-06 DIAGNOSIS — E1140 Type 2 diabetes mellitus with diabetic neuropathy, unspecified: Secondary | ICD-10-CM | POA: Diagnosis not present

## 2015-04-06 DIAGNOSIS — Z720 Tobacco use: Secondary | ICD-10-CM | POA: Insufficient documentation

## 2015-04-06 DIAGNOSIS — Z794 Long term (current) use of insulin: Secondary | ICD-10-CM | POA: Insufficient documentation

## 2015-04-06 DIAGNOSIS — Z880 Allergy status to penicillin: Secondary | ICD-10-CM | POA: Insufficient documentation

## 2015-04-06 DIAGNOSIS — R0602 Shortness of breath: Secondary | ICD-10-CM | POA: Diagnosis present

## 2015-04-06 DIAGNOSIS — F329 Major depressive disorder, single episode, unspecified: Secondary | ICD-10-CM | POA: Diagnosis not present

## 2015-04-06 LAB — I-STAT CHEM 8, ED
BUN: 15 mg/dL (ref 6–20)
CHLORIDE: 105 mmol/L (ref 101–111)
CREATININE: 0.8 mg/dL (ref 0.44–1.00)
Calcium, Ion: 1.03 mmol/L — ABNORMAL LOW (ref 1.12–1.23)
Glucose, Bld: 78 mg/dL (ref 65–99)
HEMATOCRIT: 40 % (ref 36.0–46.0)
Hemoglobin: 13.6 g/dL (ref 12.0–15.0)
Potassium: 3.5 mmol/L (ref 3.5–5.1)
Sodium: 140 mmol/L (ref 135–145)
TCO2: 24 mmol/L (ref 0–100)

## 2015-04-06 NOTE — ED Notes (Signed)
Bed: [facility]01 Expected date:  Expected time:  Means of arrival:  Comments: EMS/80F/SOB/dysphagia/TRIAGE

## 2015-04-06 NOTE — ED Provider Notes (Signed)
CSN: [id number]     Arrival date & time 04/06/15  1909 History   First MD Initiated Contact with Patient 04/06/15 1959     Chief Complaint  Patient presents with  . Shortness of Breath     (Consider location/radiation/quality/duration/timing/severity/associated sxs/prior Treatment) Patient is a 54 y.o. female presenting with shortness of breath. The history is provided by the patient.  Shortness of Breath Severity:  Moderate Onset quality:  Gradual Duration:  9 hours Timing:  Constant Progression:  Partially resolved Chronicity:  New Context: smoke exposure   Context: not activity and not URI   Relieved by:  Nothing Worsened by:  Nothing tried Ineffective treatments:  None tried Associated symptoms: no cough and no fever   Associated symptoms comment:  Difficulty swallowing for last month, feels like things getting caught in her throat   Past Medical History  Diagnosis Date  . Hypertension   . Diabetes mellitus   . Depression   . Chronic back pain   . Palpitations   . Abnormal liver function tests   . Acute kidney injury 01/2014    Hospitalized, Volume depletion, nausea and vomiting  . 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 Carpal tunnel release     Family History  Problem Relation Age of Onset  . Diabetes Mother   . Kidney disease Mother   . Seizures Father   . Hypertension Father   . Stroke Father   . Asthma Other    Social History  Substance Use Topics  . Smoking status: Current Some Day Smoker -- 0.50 packs/day for 3 years    Types: Cigarettes  . Smokeless tobacco: None  . Alcohol Use: 0.0 oz/week    0 Standard drinks or equivalent per week     Comment: occasional   OB History    No data available     Review of Systems  Constitutional: Negative for fever.  Respiratory: Positive for shortness of breath. Negative for cough.   All other systems reviewed and are  negative.     Allergies  Penicillins  Home Medications   Prior to Admission medications   Medication Sig Start Date End Date Taking? Authorizing Provider  dicyclomine (BENTYL) 20 MG tablet Take 1 tablet (20 mg total) by mouth every 6 (six) hours. Patient taking differently: Take 20 mg by mouth every 6 (six) hours as needed for spasms.  07/30/14   Gatha Mayer, MD  diphenhydramine-acetaminophen (TYLENOL PM) 25-500 MG TABS Take 0.5 tablets by mouth at bedtime as needed (For sleeping disorder.).    Historical Provider, MD  gabapentin (NEURONTIN) 100 MG capsule Take 1 capsule (100 mg total) by mouth at bedtime. 02/02/14   Belkys A Regalado, MD  insulin aspart (NOVOLOG) 100 UNIT/ML injection Inject 1-12 Units into the skin 3 (three) times daily with meals. Sliding scale    Historical Provider, MD  insulin glargine (LANTUS) 100 UNIT/ML injection Inject 0.25 mLs (25 Units total) into the skin daily. Patient taking differently: Inject 50 Units into the skin every evening.  02/02/14   Belkys A Regalado, MD  Na Sulfate-K Sulfate-Mg Sulf (SUPREP BOWEL PREP PO) Take 1 Bottle by mouth as directed.     Historical Provider, MD  Na Sulfate-K Sulfate-Mg Sulf (SUPREP BOWEL PREP) SOLN USE PER PREP INSTRUCTIONS 02/16/14   Willia Craze, NP  ondansetron [facility]) 4 MG/2ML SOLN injection Inject 2 mLs (4 mg total) into the vein every 6 (six) hours  as needed for nausea or vomiting. 02/02/14   Belkys A Regalado, MD  pantoprazole (PROTONIX) 40 MG tablet Take 1 tablet (40 mg total) by mouth daily at 12 noon. 02/02/14   Belkys A Regalado, MD  QUEtiapine Fumarate (SEROQUEL XR) 150 MG 24 hr tablet Take 150 mg by mouth at bedtime.      Historical Provider, MD  triamcinolone ointment (KENALOG) 0.1 % Apply 1 application topically 2 (two) times daily as needed (Applies to feet after showering for dry skin.).    Historical Provider, MD   BP 163/85 mmHg  Pulse 85  Temp(Src) 98.7 F (37.1 C) (Oral)  Resp 18  SpO2  97% Physical Exam  Constitutional: She is oriented to person, place, and time. She appears well-developed and well-nourished. No distress.  HENT:  Head: Normocephalic.  Eyes: Conjunctivae are normal.  Neck: Neck supple. No tracheal deviation present.  Cardiovascular: Normal rate, regular rhythm and normal heart sounds.   Pulmonary/Chest: Effort normal and breath sounds normal. No respiratory distress. She has no wheezes. She has no rales. She exhibits no tenderness.  Abdominal: Soft. She exhibits no distension. There is no tenderness.  Musculoskeletal:  Bilateral lower extremity tenderness diffusely, no swelling  Neurological: She is alert and oriented to person, place, and time.  Skin: Skin is warm and dry.  Psychiatric: She has a normal mood and affect.    ED Course  Procedures (including critical care time) Labs Review Labs Reviewed - No data to display  Imaging Review Dg Chest 2 View  04/06/2015   CLINICAL DATA:  Shortness of breath.  EXAM: CHEST  2 VIEW  COMPARISON:  11/09/2014  FINDINGS: There is slight peribronchial thickening and limb on the lateral view there is slight accentuation of the interstitial markings on 1 of the upper lobes anteriorly. The lungs are otherwise clear. Heart size and vascularity are normal. No effusions. No osseous abnormality.  IMPRESSION: Bronchitic changes. Possible small area of infiltrate seen only on the lateral view.   Electronically Signed   By: Lorriane Shire M.D.   On: 04/06/2015 20:44   I have personally reviewed and evaluated these images and lab results as part of my medical decision-making.   EKG Interpretation   Date/Time:  Wednesday April 06 2015 20:09:19 EDT Ventricular Rate:  87 PR Interval:  136 QRS Duration: 80 QT Interval:  399 QTC Calculation: 480 R Axis:   42 Text Interpretation:  Sinus rhythm Left atrial enlargement Baseline wander  in lead(s) V5 V6 Confirmed by Dalisa Forrer MD, Quillian Quince [id number]) on 04/06/2015  8:55:22 PM       MDM   Final diagnoses:  Shortness of breath  Diabetic neuropathy, painful    54 year old female with history of hypertension, diabetes, chronic pain and depression presents with what she describes a shortness of breath that occurred at rest all she was sitting in a chair earlier today. This has resolved. She is not tachycardic, she has had no risk factors for clotting, she has a normal heart and lung exam, normal chest imaging and an unremarkable EKG. She is not having chest pain or any other active signs of ACS. She has had no cough, no fevers, no abdominal pain. She is more concerned about her chronic lower extremity pain due to her diabetic neuropathy. She remained stable throughout her emergency department course without signs of dyspnea, she did not have any desaturation of her oxygen, I recommended that she follow closely with her primary care physician for reassessment this week she continues  to have the symptoms but currently there is no other acute management required.    Leo Grosser, MD 04/07/15 [phone]

## 2015-04-06 NOTE — Discharge Instructions (Signed)

## 2015-04-06 NOTE — ED Notes (Signed)
Per EMS, pt was on her way to the ED, diff breathing became severe so she pulled over and called 911.  Pt seems anxious per EMS.

## 2015-05-16 ENCOUNTER — Emergency Department (HOSPITAL_COMMUNITY): Payer: Medicare HMO

## 2015-05-16 ENCOUNTER — Emergency Department (HOSPITAL_COMMUNITY)
Admission: EM | Admit: 2015-05-16 | Discharge: 2015-05-16 | Disposition: A | Discharge status: Home / Self Care | Payer: Medicare HMO | Attending: Emergency Medicine | Admitting: Emergency Medicine

## 2015-05-16 ENCOUNTER — Encounter (HOSPITAL_COMMUNITY): Payer: Self-pay | Admitting: Emergency Medicine

## 2015-05-16 DIAGNOSIS — Y9389 Activity, other specified: Secondary | ICD-10-CM | POA: Insufficient documentation

## 2015-05-16 DIAGNOSIS — Z794 Long term (current) use of insulin: Secondary | ICD-10-CM | POA: Insufficient documentation

## 2015-05-16 DIAGNOSIS — W1839XA Other fall on same level, initial encounter: Secondary | ICD-10-CM | POA: Diagnosis not present

## 2015-05-16 DIAGNOSIS — F329 Major depressive disorder, single episode, unspecified: Secondary | ICD-10-CM | POA: Insufficient documentation

## 2015-05-16 DIAGNOSIS — Z8719 Personal history of other diseases of the digestive system: Secondary | ICD-10-CM | POA: Insufficient documentation

## 2015-05-16 DIAGNOSIS — Z79899 Other long term (current) drug therapy: Secondary | ICD-10-CM | POA: Insufficient documentation

## 2015-05-16 DIAGNOSIS — Y9289 Other specified places as the place of occurrence of the external cause: Secondary | ICD-10-CM | POA: Diagnosis not present

## 2015-05-16 DIAGNOSIS — S2232XA Fracture of one rib, left side, initial encounter for closed fracture: Secondary | ICD-10-CM | POA: Insufficient documentation

## 2015-05-16 DIAGNOSIS — W19XXXA Unspecified fall, initial encounter: Secondary | ICD-10-CM

## 2015-05-16 DIAGNOSIS — Z880 Allergy status to penicillin: Secondary | ICD-10-CM | POA: Insufficient documentation

## 2015-05-16 DIAGNOSIS — G8929 Other chronic pain: Secondary | ICD-10-CM | POA: Insufficient documentation

## 2015-05-16 DIAGNOSIS — Z87448 Personal history of other diseases of urinary system: Secondary | ICD-10-CM | POA: Insufficient documentation

## 2015-05-16 DIAGNOSIS — I10 Essential (primary) hypertension: Secondary | ICD-10-CM | POA: Diagnosis not present

## 2015-05-16 DIAGNOSIS — E119 Type 2 diabetes mellitus without complications: Secondary | ICD-10-CM | POA: Insufficient documentation

## 2015-05-16 DIAGNOSIS — Z720 Tobacco use: Secondary | ICD-10-CM | POA: Diagnosis not present

## 2015-05-16 DIAGNOSIS — Y998 Other external cause status: Secondary | ICD-10-CM | POA: Diagnosis not present

## 2015-05-16 DIAGNOSIS — S299XXA Unspecified injury of thorax, initial encounter: Secondary | ICD-10-CM | POA: Diagnosis present

## 2015-05-16 LAB — CBG MONITORING, ED: GLUCOSE-CAPILLARY: 137 mg/dL — AB (ref 65–99)

## 2015-05-16 MED ORDER — ONDANSETRON 8 MG PO TBDP
8.0000 mg | ORAL_TABLET | Freq: Once | ORAL | Status: AC
  Administered 2015-05-16: 8 mg via ORAL
  Filled 2015-05-16: qty 1

## 2015-05-16 MED ORDER — NEBIVOLOL HCL 10 MG PO TABS
10.0000 mg | ORAL_TABLET | Freq: Every day | ORAL | Status: AC
  Administered 2015-05-16: 10 mg via ORAL
  Filled 2015-05-16: qty 1

## 2015-05-16 MED ORDER — OXYCODONE-ACETAMINOPHEN 5-325 MG PO TABS
2.0000 | ORAL_TABLET | Freq: Once | ORAL | Status: AC
  Administered 2015-05-16: 2 via ORAL
  Filled 2015-05-16: qty 2

## 2015-05-16 MED ORDER — NEBIVOLOL HCL 10 MG PO TABS: 10.0000 mg | ORAL_TABLET | Freq: Every day | ORAL | Status: DC

## 2015-05-16 MED ORDER — OXYCODONE-ACETAMINOPHEN 5-325 MG PO TABS
1.0000 | ORAL_TABLET | Freq: Four times a day (QID) | ORAL | Status: DC | PRN
Start: 2015-05-16 — End: 2015-08-31

## 2015-05-16 NOTE — Discharge Instructions (Signed)
Take by systolic as prescribed for high blood pressure. Have your blood pressure rechecked by her primary doctor in 2-3 days. Take Percocet as needed for pain control. Use an incentive spirometer, 1 puff per hour, while awake. You may take ibuprofen or Aleve as needed for additional pain control. Return to the emergency department as needed if symptoms worsen.  Rib Fracture A rib fracture is a break or crack in one of the bones of the ribs. The ribs are a group of long, curved bones that wrap around your chest and attach to your spine. They protect your lungs and other organs in the chest cavity. A broken or cracked rib is often painful, but most do not cause other problems. Most rib fractures heal on their own over time. However, rib fractures can be more serious if multiple ribs are broken or if broken ribs move out of place and push against other structures. CAUSES   A direct blow to the chest. For example, this could happen during contact sports, a car accident, or a fall against a hard object.  Repetitive movements with high force, such as pitching a baseball or having severe coughing spells. SYMPTOMS   Pain when you breathe in or cough.  Pain when someone presses on the injured area. DIAGNOSIS  Your caregiver will perform a physical exam. Various imaging tests may be ordered to confirm the diagnosis and to look for related injuries. These tests may include a chest X-ray, computed tomography (CT), magnetic resonance imaging (MRI), or a bone scan. TREATMENT  Rib fractures usually heal on their own in 1-3 months. The longer healing period is often associated with a continued cough or other aggravating activities. During the healing period, pain control is very important. Medication is usually given to control pain. Hospitalization or surgery may be needed for more severe injuries, such as those in which multiple ribs are broken or the ribs have moved out of place.  HOME CARE INSTRUCTIONS    Avoid strenuous activity and any activities or movements that cause pain. Be careful during activities and avoid bumping the injured rib.  Gradually increase activity as directed by your caregiver.  Only take over-the-counter or prescription medications as directed by your caregiver. Do not take other medications without asking your caregiver first.  Apply ice to the injured area for the first 1-2 days after you have been treated or as directed by your caregiver. Applying ice helps to reduce inflammation and pain.  Put ice in a plastic bag.  Place a towel between your skin and the bag.   Leave the ice on for 15-20 minutes at a time, every 2 hours while you are awake.  Perform deep breathing as directed by your caregiver. This will help prevent pneumonia, which is a common complication of a broken rib. Your caregiver may instruct you to:  Take deep breaths several times a day.  Try to cough several times a day, holding a pillow against the injured area.  Use a device called an incentive spirometer to practice deep breathing several times a day.  Drink enough fluids to keep your urine clear or pale yellow. This will help you avoid constipation.   Do not wear a rib belt or binder. These restrict breathing, which can lead to pneumonia.  SEEK IMMEDIATE MEDICAL CARE IF:   You have a fever.   You have difficulty breathing or shortness of breath.   You develop a continual cough, or you cough up thick or bloody sputum.  You feel sick to your stomach (nausea), throw up (vomit), or have abdominal pain.   You have worsening pain not controlled with medications.  MAKE SURE YOU:  Understand these instructions.  Will watch your condition.  Will get help right away if you are not doing well or get worse. Document Released: 08/06/2005 Document Revised: 04/08/2013 Document Reviewed: 10/08/2012 [facility] Patient Information 2015 [place], [place]. This information is not intended to  replace advice given to you by your health care provider. Make sure you discuss any questions you have with your health care provider.   Fall Prevention and Home Safety Falls cause injuries and can affect all age groups. It is possible to prevent falls.  HOW TO PREVENT FALLS  Wear shoes with rubber soles that do not have an opening for your toes.  Keep the inside and outside of your house well lit.  Use night lights throughout your home.  Remove clutter from floors.  Clean up floor spills.  Remove throw rugs or fasten them to the floor with carpet tape.  Do not place electrical cords across pathways.  Put grab bars by your tub, shower, and toilet. Do not use towel bars as grab bars.  Put handrails on both sides of the stairway. Fix loose handrails.  Do not climb on stools or stepladders, if possible.  Do not wax your floors.  Repair uneven or unsafe sidewalks, walkways, or stairs.  Keep items you use a lot within reach.  Be aware of pets.  Keep emergency numbers next to the telephone.  Put smoke detectors in your home and near bedrooms. Ask your doctor what other things you can do to prevent falls. Document Released: 06/02/2009 Document Revised: 02/05/2012 Document Reviewed: 11/06/2011 [facility] Patient Information 2015 [place], [place]. This information is not intended to replace advice given to you by your health care provider. Make sure you discuss any questions you have with your health care provider.

## 2015-05-16 NOTE — ED Notes (Signed)
Patient is having pain on left side of her body (shoulders down to her pelvis). Patient states she landed on her left side. Patient did not loose consciousness.

## 2015-05-16 NOTE — ED Provider Notes (Signed)
CSN: [id number]     Arrival date & time 05/16/15  0213 History   First MD Initiated Contact with Patient 05/16/15 0254     Chief Complaint  Patient presents with  . Fall     (Consider location/radiation/quality/duration/timing/severity/associated sxs/prior Treatment) HPI Comments: 54 year old female with a history of hypertension, diabetes mellitus, depression, and chronic back pain presents to the emergency department for further evaluation of pain to the left side of her body. Patient states that she was standing on a stool when the stool rocked and she lost her footing causing her to fall on her left side. Incident occurred at 2200 this evening. Patient states that she is having most of her pain in her left side and ribs. She reports worsening pain with deep breathing. No medications taken prior to arrival for symptoms. Patient denies hitting her head or losing consciousness. She has had no bowel or bladder incontinence and was ambulatory into the emergency department. Patient denies any new or worsening numbness or tingling in her extremities.  Patient is a 54 y.o. female presenting with fall. The history is provided by the patient. No language interpreter was used.  Fall Associated symptoms include chest pain (L chest wall). Pertinent negatives include no fever, nausea, numbness, vomiting or weakness.    Past Medical History  Diagnosis Date  . Hypertension   . Diabetes mellitus   . Depression   . Chronic back pain   . Palpitations   . Abnormal liver function tests   . Acute kidney injury 01/2014    Hospitalized, Volume depletion, nausea and vomiting  . 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 Carpal tunnel release     Family History  Problem Relation Age of Onset  . Diabetes Mother   . Kidney disease Mother   . Seizures Father   . Hypertension Father   . Stroke Father   . Asthma Other    Social History    Substance Use Topics  . Smoking status: Current Some Day Smoker -- 0.50 packs/day for 3 years    Types: Cigarettes  . Smokeless tobacco: None  . Alcohol Use: 0.0 oz/week    0 Standard drinks or equivalent per week     Comment: occasional   OB History    No data available      Review of Systems  Constitutional: Negative for fever.  Respiratory: Positive for shortness of breath (pain with deep breathing).   Cardiovascular: Positive for chest pain (L chest wall).  Gastrointestinal: Negative for nausea and vomiting.  Neurological: Negative for weakness and numbness.  All other systems reviewed and are negative.   Allergies  Penicillins  Home Medications   Prior to Admission medications   Medication Sig Start Date End Date Taking? Authorizing Provider  ALPRAZolam Duanne Moron) 0.5 MG tablet Take 0.5 mg by mouth 3 (three) times daily as needed for anxiety.   Yes Historical Provider, MD  cyclobenzaprine (FLEXERIL) 10 MG tablet Take 10 mg by mouth 3 (three) times daily as needed for muscle spasms.   Yes Historical Provider, MD  gabapentin (NEURONTIN) 100 MG capsule Take 1 capsule (100 mg total) by mouth at bedtime. 02/02/14  Yes Belkys A Regalado, MD  glimepiride (AMARYL) 4 MG tablet Take 4 mg by mouth 2 (two) times daily.   Yes Historical Provider, MD  hydrOXYzine (ATARAX/VISTARIL) 25 MG tablet Take 25 mg by mouth every 6 (six) hours as needed for itching.  Yes Historical Provider, MD  insulin aspart (NOVOLOG) 100 UNIT/ML injection Inject 1-12 Units into the skin 3 (three) times daily as needed for high blood sugar. Sliding scale   Yes Historical Provider, MD  insulin glargine (LANTUS) 100 UNIT/ML injection Inject 0.25 mLs (25 Units total) into the skin daily. Patient taking differently: Inject 50 Units into the skin every evening.  02/02/14  Yes Belkys A Regalado, MD  metFORMIN (GLUCOPHAGE) 500 MG tablet Take 500 mg by mouth daily with breakfast.   Yes Historical Provider, MD  pantoprazole  (PROTONIX) 40 MG tablet Take 1 tablet (40 mg total) by mouth daily at 12 noon. 02/02/14  Yes Belkys A Regalado, MD  pregabalin (LYRICA) 75 MG capsule Take 75 mg by mouth 2 (two) times daily.   Yes Historical Provider, MD  QUEtiapine Fumarate (SEROQUEL XR) 150 MG 24 hr tablet Take 75 mg by mouth daily.    Yes Historical Provider, MD  simvastatin (ZOCOR) 10 MG tablet Take 10 mg by mouth daily.   Yes Historical Provider, MD  dicyclomine (BENTYL) 20 MG tablet Take 1 tablet (20 mg total) by mouth every 6 (six) hours. Patient taking differently: Take 20 mg by mouth every 6 (six) hours as needed for spasms.  07/30/14   Gatha Mayer, MD  diphenhydramine-acetaminophen (TYLENOL PM) 25-500 MG TABS Take 0.5 tablets by mouth at bedtime as needed (For sleeping disorder.).    Historical Provider, MD  Na Sulfate-K Sulfate-Mg Sulf ([place]) SOLN USE PER PREP INSTRUCTIONS Patient not taking: Reported on 05/16/2015 02/16/14   Willia Craze, NP  nebivolol (BYSTOLIC) 10 MG tablet Take 1 tablet (10 mg total) by mouth daily. 05/16/15   Antonietta Breach, PA-C  ondansetron [facility]) 4 MG/2ML SOLN injection Inject 2 mLs (4 mg total) into the vein every 6 (six) hours as needed for nausea or vomiting. 02/02/14   Belkys A Regalado, MD  oxyCODONE-acetaminophen (PERCOCET/ROXICET) 5-325 MG per tablet Take 1-2 tablets by mouth every 6 (six) hours as needed for severe pain. 05/16/15   Antonietta Breach, PA-C  triamcinolone ointment (KENALOG) 0.1 % Apply 1 application topically 2 (two) times daily as needed (Applies to feet after showering for dry skin.).    Historical Provider, MD   BP 167/89 mmHg  Pulse 85  Temp(Src) 98.2 F (36.8 C) (Oral)  Resp 20  Ht 5\' 4"  (1.626 m)  Wt 170 lb (77.111 kg)  BMI 29.17 kg/m2  SpO2 95%   Physical Exam  Constitutional: She is oriented to person, place, and time. She appears well-developed and well-nourished. No distress.  Nontoxic/nonseptic appearing  HENT:  Head: Normocephalic and  atraumatic.  Eyes: Conjunctivae and EOM are normal. No scleral icterus.  Neck: Normal range of motion.  Cardiovascular: Normal rate, regular rhythm and intact distal pulses.   Pulmonary/Chest: Effort normal and breath sounds normal. No respiratory distress. She has no wheezes. She has no rales. She exhibits tenderness.    Chest expansion symmetric. TTP to left lateral chest wall along the left anterior axillary line. No bony deformity or crepitus.  Musculoskeletal: Normal range of motion.  No leg shortening or malrotation. No pelvic instability.  Neurological: She is alert and oriented to person, place, and time. She exhibits normal muscle tone. Coordination normal.  Sensation to light touch intact b/l. Patient moving all extremities. GCS 15.  Skin: Skin is warm and dry. No rash noted. She is not diaphoretic. No erythema. No pallor.  Psychiatric: She has a normal mood and affect. Her behavior  is normal.  Nursing note and vitals reviewed.   ED Course  Procedures (including critical care time) Labs Review Labs Reviewed  CBG MONITORING, ED - Abnormal; Notable for the following:    Glucose-Capillary 137 (*)    All other components within normal limits  CBG MONITORING, ED    Imaging Review Dg Ribs Unilateral W/chest Left  05/16/2015   CLINICAL DATA:  Fall with left-sided chest pain.  Initial encounter.  EXAM: LEFT RIBS AND CHEST - 3+ VIEW  COMPARISON:  04/06/2015  FINDINGS: Nondisplaced lateral left eighth rib fracture. No hemothorax or pneumothorax. Mild left basilar atelectasis.  Stable heart size and mediastinal contours, accentuated by hypoventilation.  IMPRESSION: 1. Nondisplaced left eighth rib fracture. 2. Mild left basilar atelectasis.   Electronically Signed   By: Monte Fantasia M.D.   On: 05/16/2015 04:03   Dg Pelvis 1-2 Views  05/16/2015   CLINICAL DATA:  Left-sided pelvic pain after fall.  EXAM: PELVIS - 1-2 VIEW  COMPARISON:  None.  FINDINGS: The cortical margins of the bony  pelvis are intact. No fracture. Pubic symphysis and sacroiliac joints are congruent. Both femoral heads are well-seated in the respective acetabula.  IMPRESSION: Negative radiograph of the pelvis.   Electronically Signed   By: Jeb Levering M.D.   On: 05/16/2015 04:00     I have personally reviewed and evaluated these images and lab results as part of my medical decision-making.   EKG Interpretation None      MDM   Final diagnoses:  Left rib fracture, closed, initial encounter  Essential hypertension    54 year old female presents to the emergency department after a fall off of a stool, landing on her left side. Patient complaining of tenderness mostly under her left breast. There is no bony deformity or crepitus on exam. Chest expansion symmetric. No tachypnea or dyspnea. No hypoxia. Patient also complaining of pain radiating down to her pelvis. She has had no incontinence since the fall. No red flags or signs concerning for cauda equina. Patient neurovascularly intact.  X-ray of pelvis is negative. Chest x-ray also completed which shows an eighth rib fracture on the left which is nondisplaced. No pneumothorax. Patient given incentive spirometer and Percocet in the ED for symptoms. Have advised home care with NSAIDs and Percocet as well as an incentive spirometer, once per hour, while awake.   Patient noted to have hypertension on arrival. This is probably partially secondary to pain, though patient does have a documented hx of HTN. She is not currently on any blood pressure medication. Chart reviewed. Patient has a history of being on Bystolic as well as and ARB for BP control. She was found to be hypotensive with renal failure 1 year ago after a fall, at which time her medications were discontinued. Her kidney function has resolved; Cr 1.2 back in 07/2014. Will restart Bystolic for HTN and have BP rechecked by PCP in 2-3 days.   No indication for further emergent workup at this time.  Return precautions discussed and provided. Patient agreeable to plan with no unaddressed concerns. Patient discharged in good condition; VSS.   Filed Vitals:   05/16/15 0230 05/16/15 0300 05/16/15 0430 05/16/15 0454  BP: 202/103 193/93 179/89 167/89  Pulse: 89 82 85 85  Temp:    98.2 F (36.8 C)  TempSrc:    Oral  Resp: 11 21  20   Height:    5\' 4"  (1.626 m)  Weight:    170 lb (77.111 kg)  SpO2:  98% 99% 96% 95%     Antonietta Breach, PA-C 05/16/15 0510  Rolland Porter, MD 05/16/15 [phone]

## 2015-05-16 NOTE — ED Notes (Signed)
Pt states that she fell at approx. 10pm off of a stool and now has L sided rib pain. Alert and oriented.

## 2015-07-19 IMAGING — CR DG HIP COMPLETE 2+V*R*
3 series · 3 of 3 positions shown · non-contrast
Comparison: None.

CLINICAL DATA: Fall.

EXAM:
RIGHT HIP - COMPLETE 2+ VIEW

[t pelvis ap]
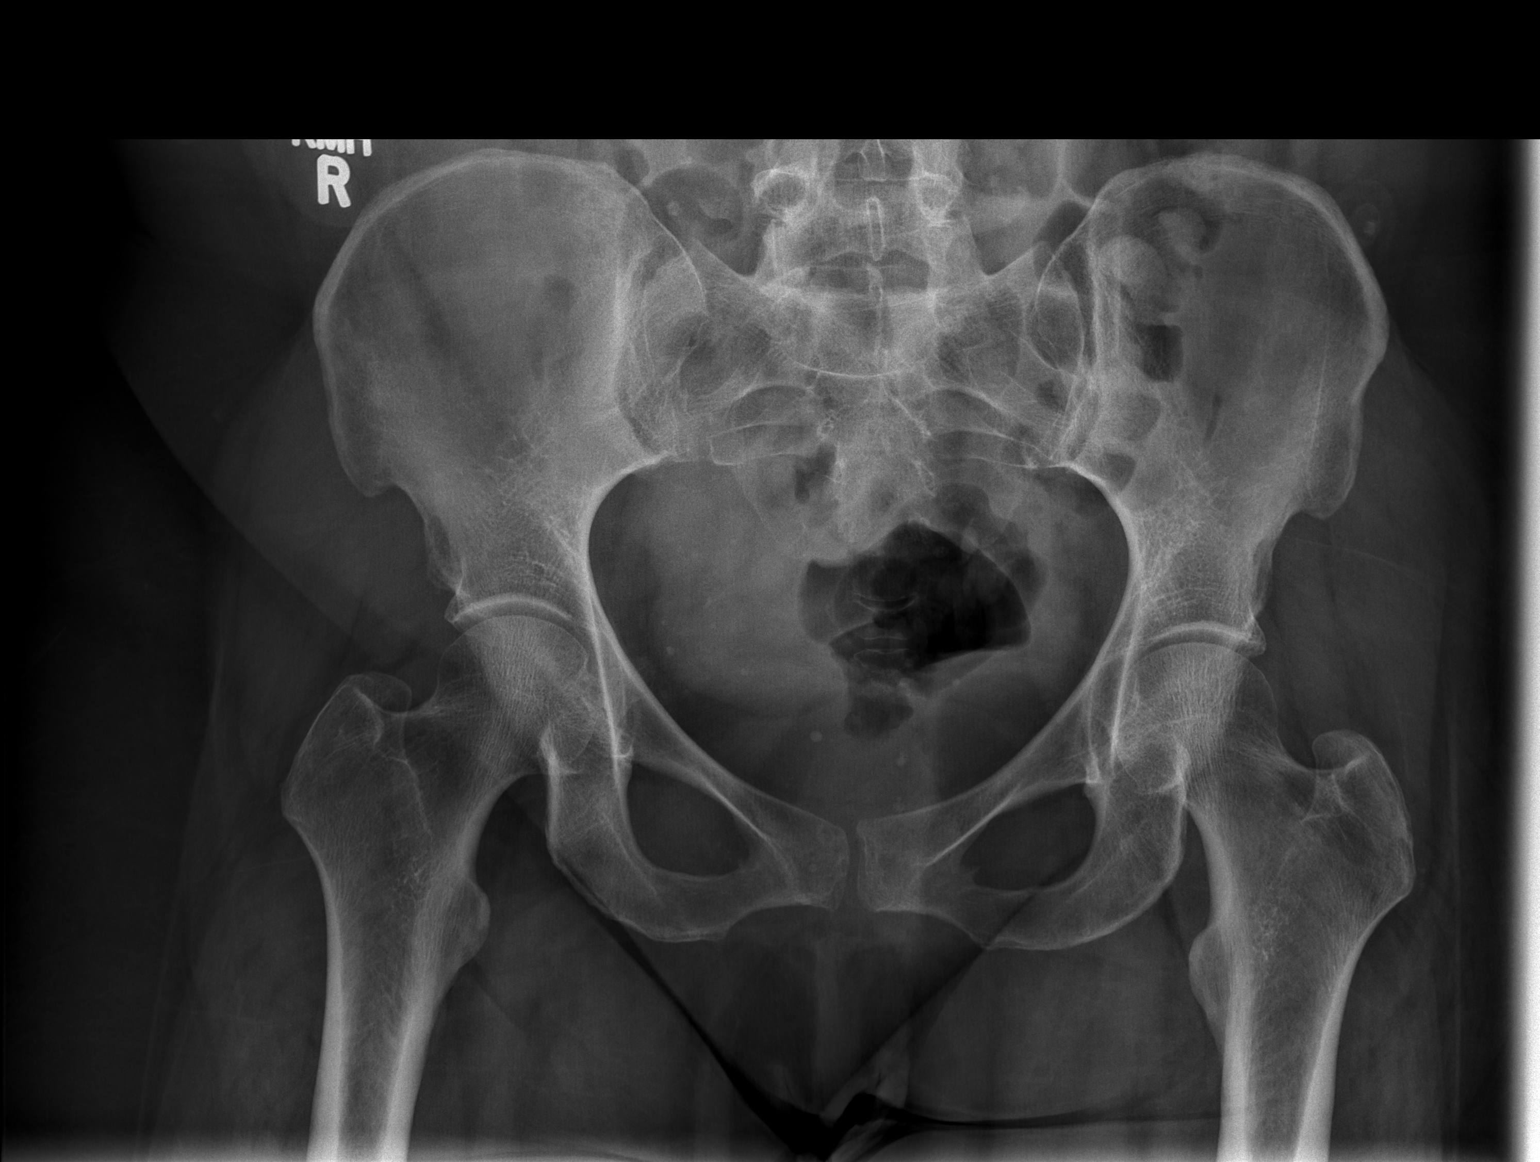

[t hip ap right]
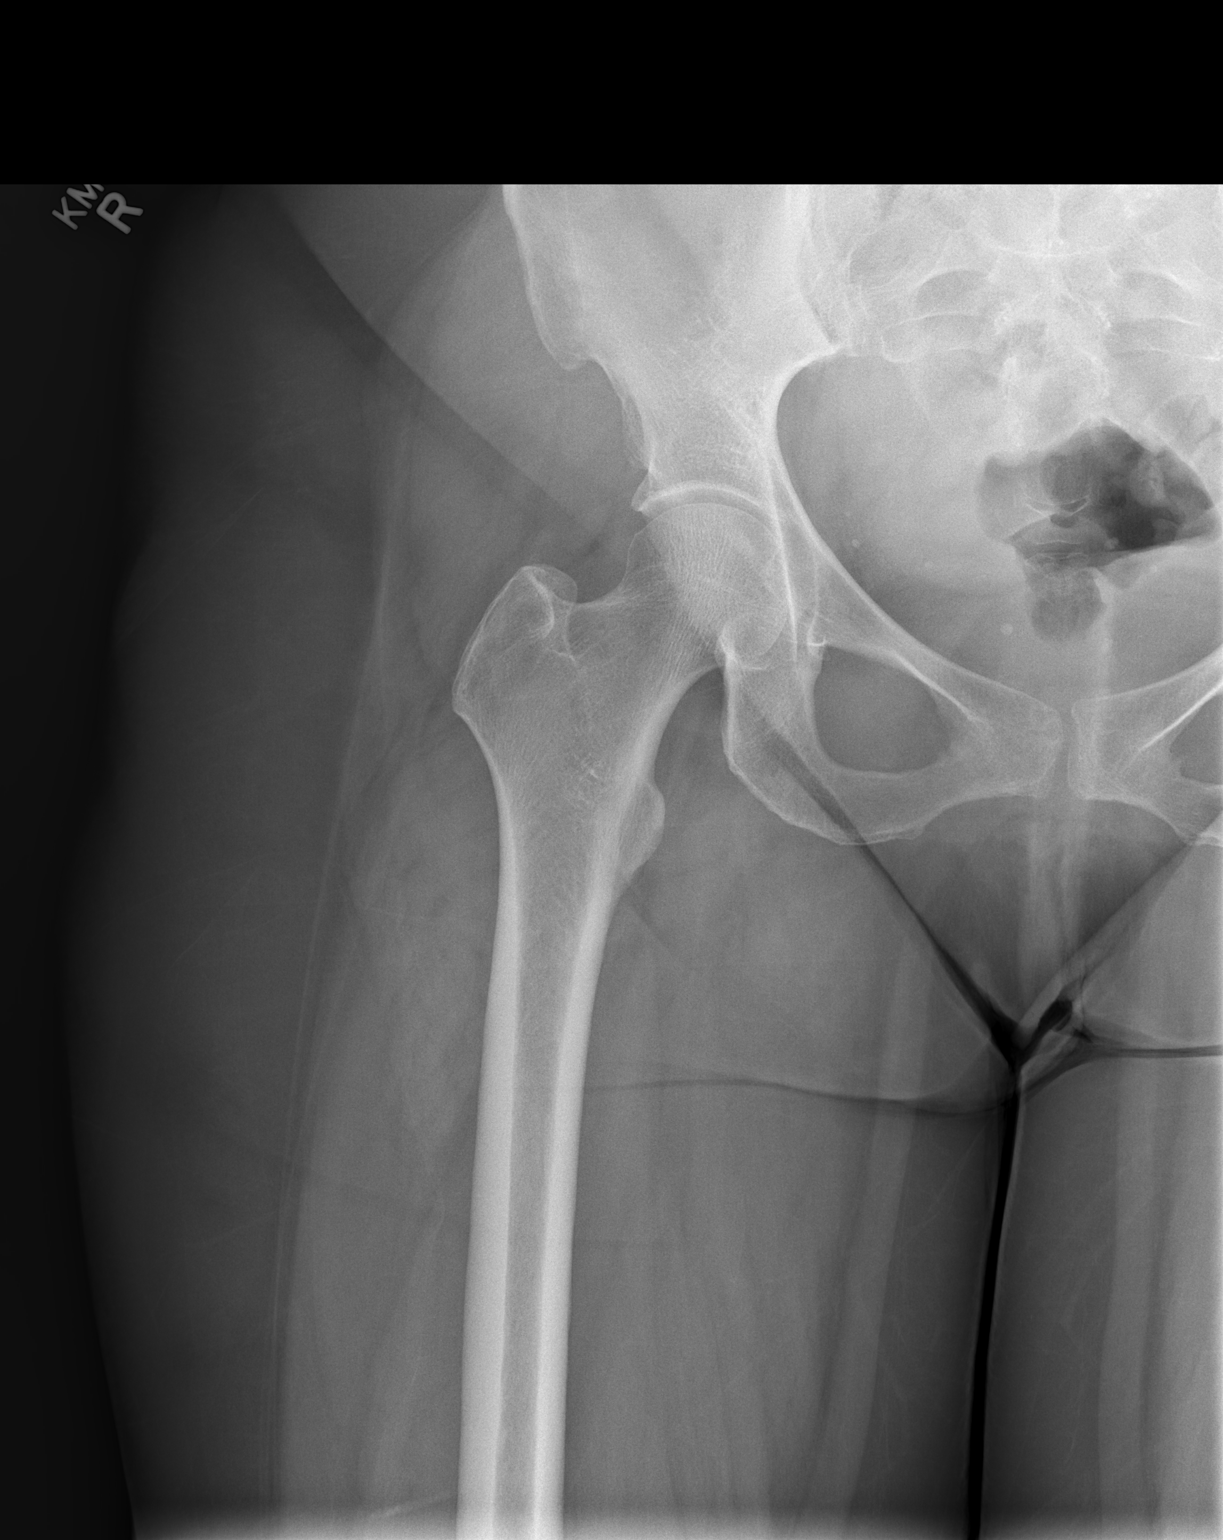

[t hip frog leg right]
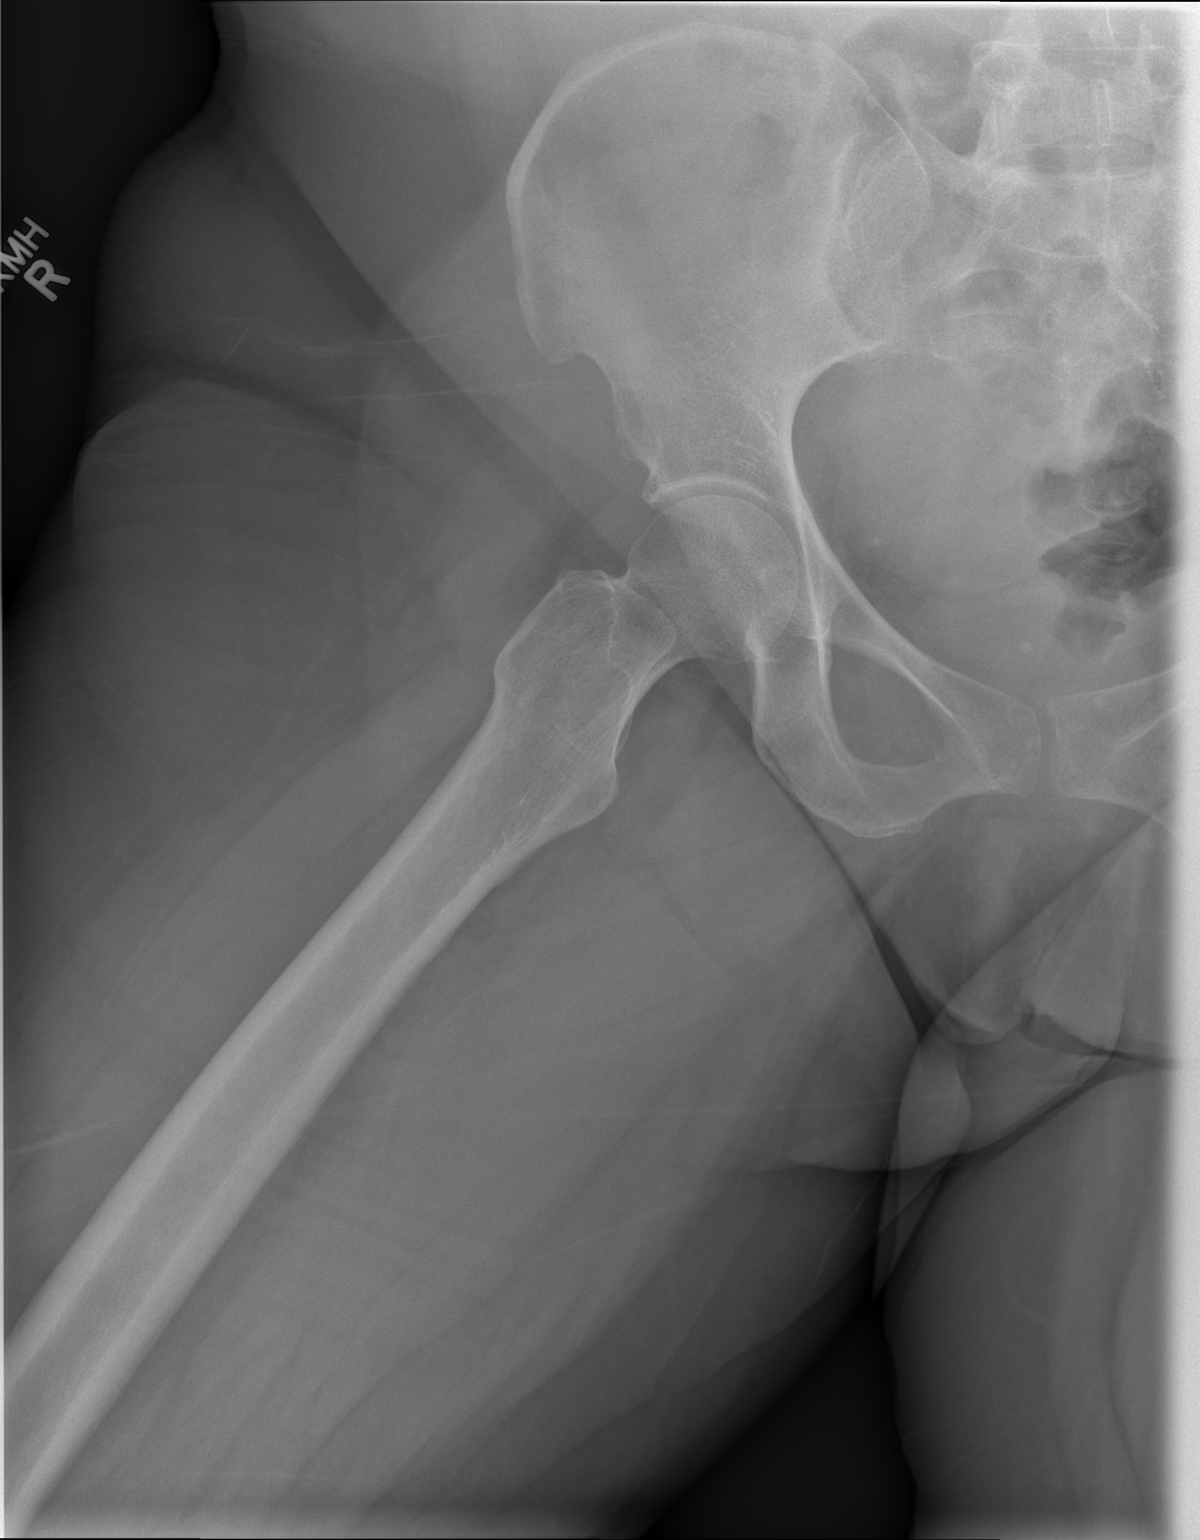

[3 of 3 positions shown; findings below may reference images not displayed]

FINDINGS: Calcifications pelvis consistent with phleboliths. No acute bony or
joint abnormality identified. No evidence of fracture or
dislocation.
IMPRESSION: No acute abnormality identified.

## 2015-07-19 IMAGING — CT CT HEAD W/O CM
1 of 3 series · 15 of 30 positions shown, 19 images · non-contrast
Comparison: 10/26/2009

CLINICAL DATA: Status post fall

EXAM:
CT HEAD WITHOUT CONTRAST
CT MAXILLOFACIAL WITHOUT CONTRAST
TECHNIQUE: Multidetector CT imaging of the head and maxillofacial structures
were performed using the standard protocol without intravenous
contrast. Multiplanar CT image reconstructions of the maxillofacial
structures were also generated.

[Series 3: facial st · axial · 0.32mm/px · z∈[-410,-270]mm · 15 of 80 slices shown, 19 images]
[im 5/80  brain]
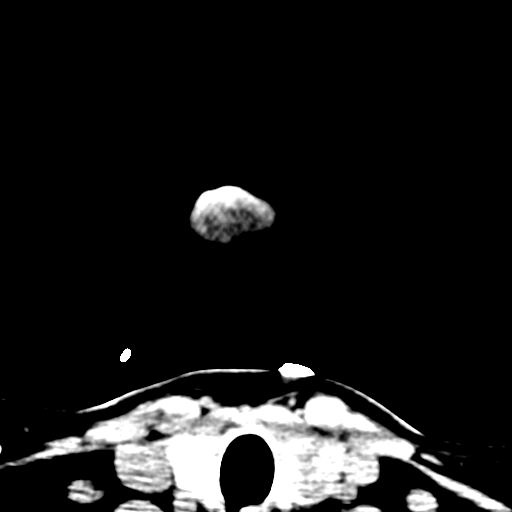
[im 5/80  bone]
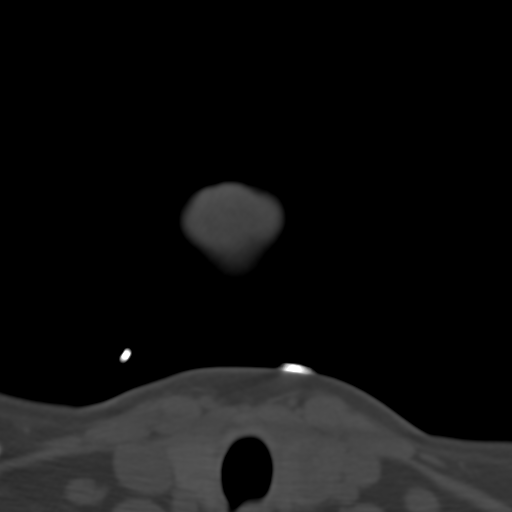
[im 10/80  brain]
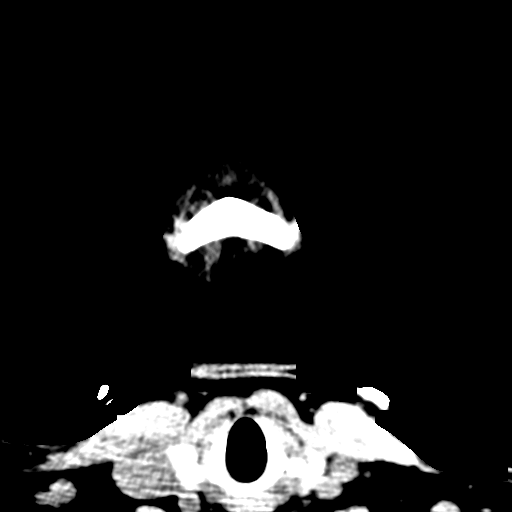
[im 14/80  brain]
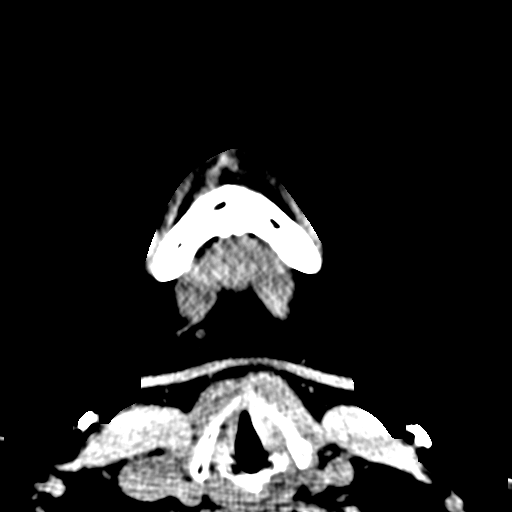
[im 19/80  brain]
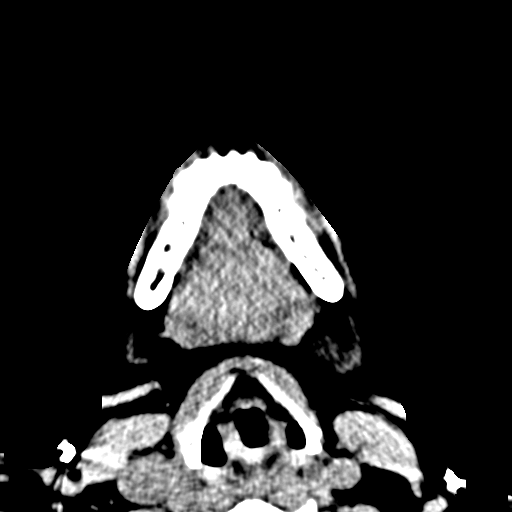
[im 24/80  brain]
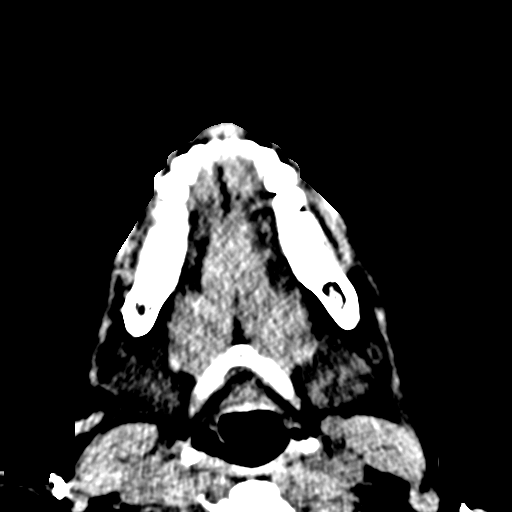
[im 24/80  bone]
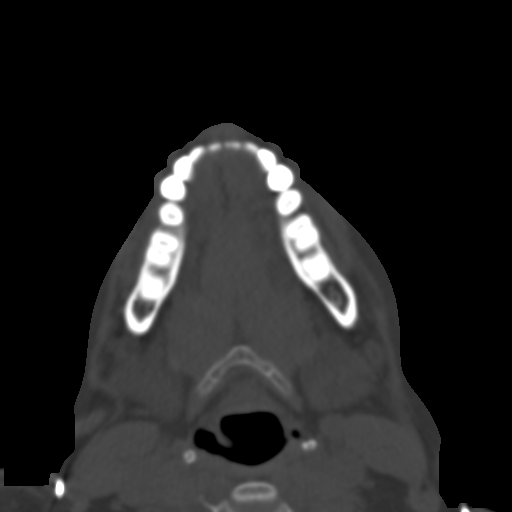
[im 33/80  brain]
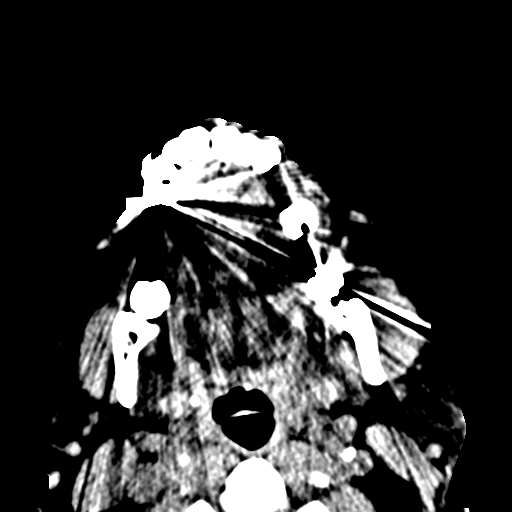
[im 38/80  brain]
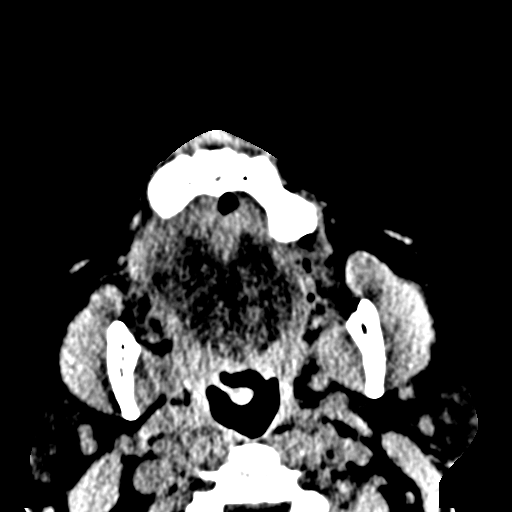
[im 42/80  brain]
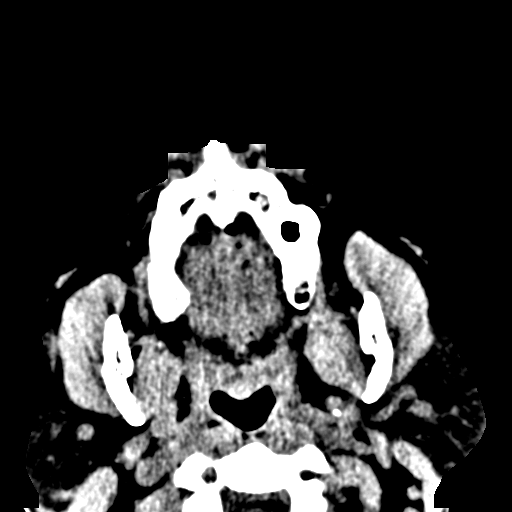
[im 47/80  brain]
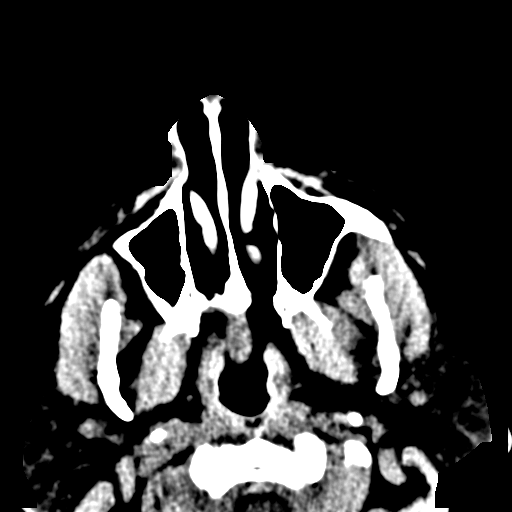
[im 47/80  bone]
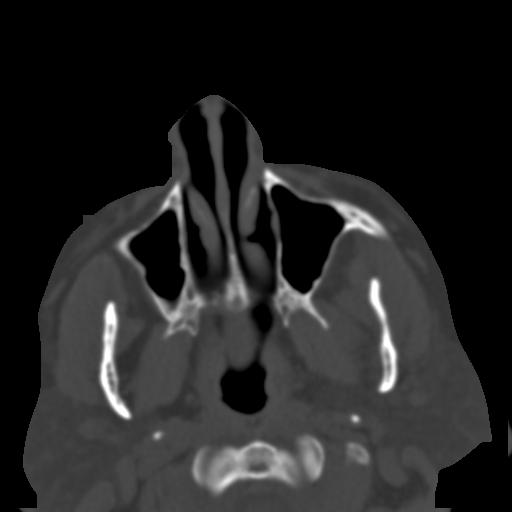
[im 52/80  brain]
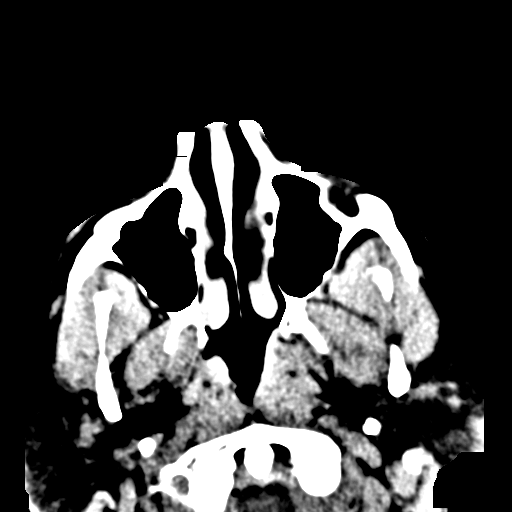
[im 56/80  brain]
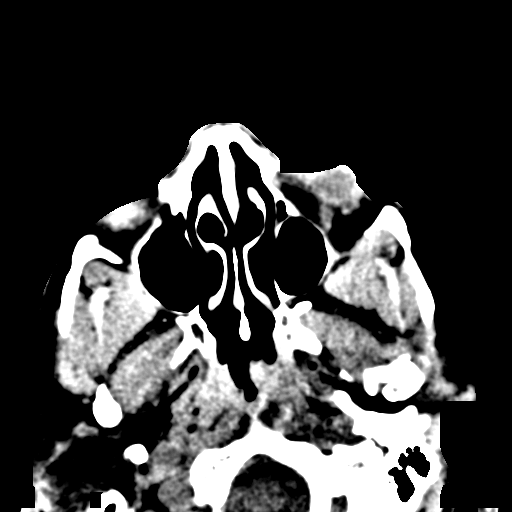
[im 61/80  brain]
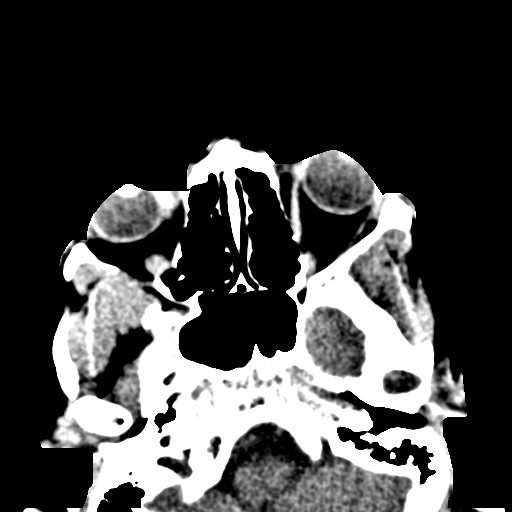
[im 66/80  brain]
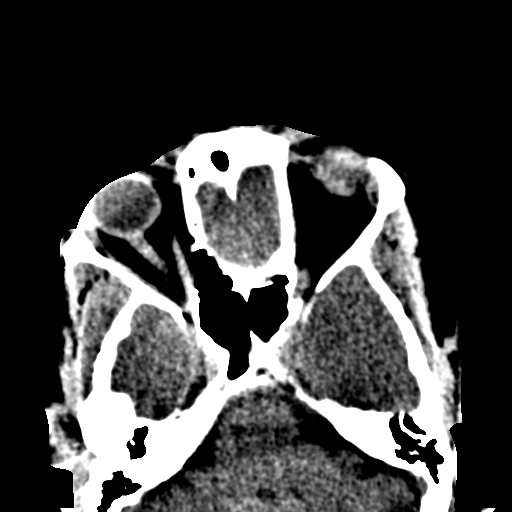
[im 66/80  bone]
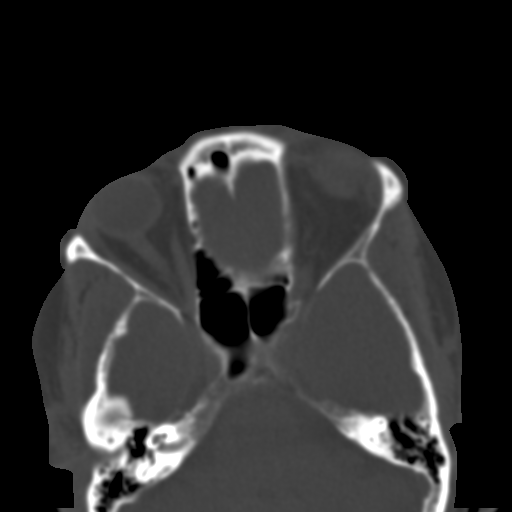
[im 70/80  brain]
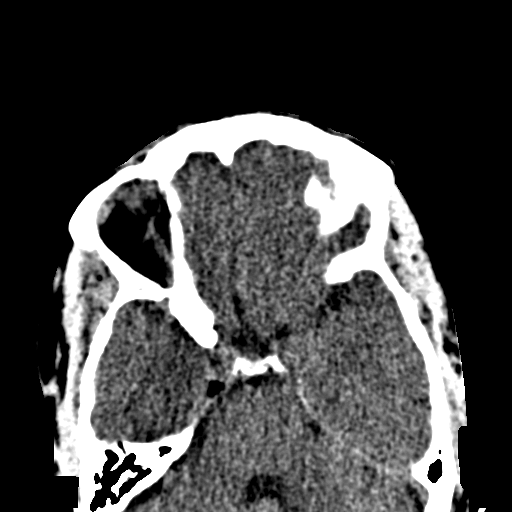
[im 75/80  brain]
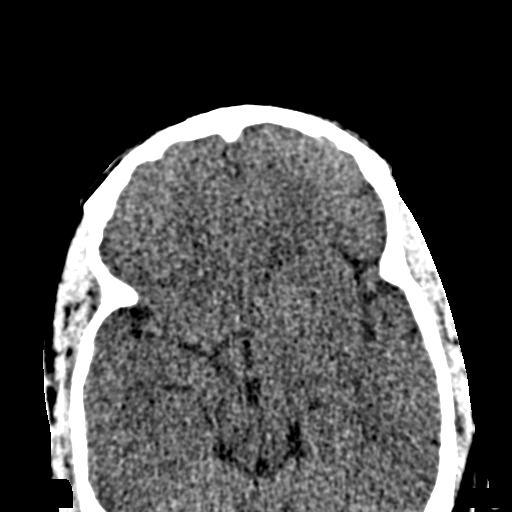

[15 of 30 positions shown; findings below may reference images not displayed]

FINDINGS: CT HEAD FINDINGS

No acute cortical infarct, hemorrhage, or mass lesion ispresent.
Ventricles are of normal size. No significant extra-axial fluid
collection is present. The paranasal sinuses andmastoid air cells
are clear. The osseous skull is intact.

CT MAXILLOFACIAL FINDINGS

Comminuted fracture involves the left mandibular condyle. Mild
ventral angulation of the distal fracture fragments. No dislocation.
The remaining facial bones appear intact.
IMPRESSION: 1. No acute intracranial abnormalities.
2. Acute fracture of the left mandibular condyle.

## 2015-08-31 ENCOUNTER — Emergency Department (HOSPITAL_COMMUNITY)
Admission: EM | Admit: 2015-08-31 | Discharge: 2015-08-31 | Disposition: A | Discharge status: Home / Self Care | Payer: Medicare HMO | Attending: Emergency Medicine | Admitting: Emergency Medicine

## 2015-08-31 ENCOUNTER — Emergency Department (HOSPITAL_COMMUNITY): Payer: Medicare HMO

## 2015-08-31 ENCOUNTER — Encounter (HOSPITAL_COMMUNITY): Payer: Self-pay | Admitting: *Deleted

## 2015-08-31 DIAGNOSIS — S62609A Fracture of unspecified phalanx of unspecified finger, initial encounter for closed fracture: Secondary | ICD-10-CM

## 2015-08-31 DIAGNOSIS — F329 Major depressive disorder, single episode, unspecified: Secondary | ICD-10-CM | POA: Insufficient documentation

## 2015-08-31 DIAGNOSIS — S62624A Displaced fracture of medial phalanx of right ring finger, initial encounter for closed fracture: Secondary | ICD-10-CM | POA: Diagnosis not present

## 2015-08-31 DIAGNOSIS — Z880 Allergy status to penicillin: Secondary | ICD-10-CM | POA: Diagnosis not present

## 2015-08-31 DIAGNOSIS — Z79899 Other long term (current) drug therapy: Secondary | ICD-10-CM | POA: Diagnosis not present

## 2015-08-31 DIAGNOSIS — F17210 Nicotine dependence, cigarettes, uncomplicated: Secondary | ICD-10-CM | POA: Diagnosis not present

## 2015-08-31 DIAGNOSIS — Z23 Encounter for immunization: Secondary | ICD-10-CM | POA: Diagnosis not present

## 2015-08-31 DIAGNOSIS — Y999 Unspecified external cause status: Secondary | ICD-10-CM | POA: Insufficient documentation

## 2015-08-31 DIAGNOSIS — S61210A Laceration without foreign body of right index finger without damage to nail, initial encounter: Secondary | ICD-10-CM | POA: Insufficient documentation

## 2015-08-31 DIAGNOSIS — G8929 Other chronic pain: Secondary | ICD-10-CM | POA: Diagnosis not present

## 2015-08-31 DIAGNOSIS — Z87448 Personal history of other diseases of urinary system: Secondary | ICD-10-CM | POA: Diagnosis not present

## 2015-08-31 DIAGNOSIS — Z794 Long term (current) use of insulin: Secondary | ICD-10-CM | POA: Insufficient documentation

## 2015-08-31 DIAGNOSIS — Y9389 Activity, other specified: Secondary | ICD-10-CM | POA: Insufficient documentation

## 2015-08-31 DIAGNOSIS — E119 Type 2 diabetes mellitus without complications: Secondary | ICD-10-CM | POA: Diagnosis not present

## 2015-08-31 DIAGNOSIS — I10 Essential (primary) hypertension: Secondary | ICD-10-CM | POA: Diagnosis not present

## 2015-08-31 DIAGNOSIS — S6991XA Unspecified injury of right wrist, hand and finger(s), initial encounter: Secondary | ICD-10-CM | POA: Diagnosis present

## 2015-08-31 DIAGNOSIS — Z8719 Personal history of other diseases of the digestive system: Secondary | ICD-10-CM | POA: Insufficient documentation

## 2015-08-31 DIAGNOSIS — Y9289 Other specified places as the place of occurrence of the external cause: Secondary | ICD-10-CM | POA: Insufficient documentation

## 2015-08-31 MED ORDER — OXYCODONE-ACETAMINOPHEN 5-325 MG PO TABS
1.0000 | ORAL_TABLET | Freq: Once | ORAL | Status: AC
  Administered 2015-08-31: 1 via ORAL
  Filled 2015-08-31: qty 1

## 2015-08-31 MED ORDER — TETANUS-DIPHTH-ACELL PERTUSSIS 5-2.5-18.5 LF-MCG/0.5 IM SUSP
0.5000 mL | Freq: Once | INTRAMUSCULAR | Status: AC
  Administered 2015-08-31: 0.5 mL via INTRAMUSCULAR
  Filled 2015-08-31: qty 0.5

## 2015-08-31 MED ORDER — OXYCODONE-ACETAMINOPHEN 5-325 MG PO TABS
1.0000 | ORAL_TABLET | Freq: Four times a day (QID) | ORAL | Status: DC | PRN
Start: 2015-08-31 — End: 2022-03-22

## 2015-08-31 NOTE — Discharge Instructions (Signed)
You were evaluated in the ED for your hand pain and found to have a broken finger. You will be given a finger splint. Take your pain medicine as prescribed for extreme pain, Motrin or Tylenol for moderate pain. Follow-up with Dr. Caralyn Guile for reevaluation and definitive care.  Finger Fracture Finger fractures are breaks in the bones of the fingers. There are many types of fractures. There are also different ways of treating these fractures. Your doctor will talk with you about the best way to treat your fracture. Injury is the main cause of broken fingers. This includes:  Injuries while playing sports.  Workplace injuries.  Falls. HOME CARE  Follow your doctor's instructions for:  Activities.  Exercises.  Physical therapy.  Take medicines only as told by your doctor for pain, discomfort, or fever. GET HELP IF: You have pain or swelling that limits:  The motion of your fingers.  The use of your fingers. GET HELP RIGHT AWAY IF:  You cannot feel your fingers, or your fingers become numb.   This information is not intended to replace advice given to you by your health care provider. Make sure you discuss any questions you have with your health care provider.   Document Released: 01/23/2008 Document Revised: 08/27/2014 Document Reviewed: 03/18/2013 Elsevier Interactive Patient Education [employer].

## 2015-08-31 NOTE — ED Notes (Signed)
Declined W/C at D/C and was escorted to lobby by RN. 
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
## 2015-09-09 ENCOUNTER — Encounter (HOSPITAL_COMMUNITY): Payer: Self-pay | Admitting: *Deleted

## 2015-09-09 NOTE — Progress Notes (Signed)
Pt states she has a history of a heart murmur and palpitations. She states she's never had any issues with the heart murrmur. Pt is diabetic. Instructed pt to take 40 units of Lantus Insulin Sunday night (80% of usual dose), not to take her evening dose of Glimiperide, on Monday AM instructed pt to take 25 units of Lantus Insulin and if her blood sugar is greater than 220 to use her Novolog insulin and and take 1/2 of her usual correction dose. Also instructed her to treat a blood sugar less than 70 with 1/2 cup of clear juice (apple or cranberry) and then check her blood sugar 15 minutes later. If still less than 70 pt instructed to call the Short Stay unit and speak with a nurse. All these instructions are per Diabetes Medication Adjustment Guidelines Prior to Procedure and Surgery. Pt voiced understanding.

## 2015-09-10 NOTE — H&P (Signed)
Carol [name] is an 55 y.o. female.   Chief Complaint: right hand middle finger middle phalanx fracture HPI: Pt sustained closed right hand middle phalanx fracture Pt here for surgery on right middle finger Pt seen/evaluated in office No prior surgery to right long finger   Past Medical History  Diagnosis Date  . Hypertension   . Diabetes mellitus   . Depression   . Chronic back pain   . Palpitations   . Abnormal liver function tests   . Acute kidney injury ([place]) 01/2014    Hospitalized, Volume depletion, nausea and vomiting  . 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 Heart murmur   . Anxiety   . GERD (gastroesophageal reflux disease)     Past Surgical History  Procedure Laterality Date  . Carpal tunnel release    . Tonsillectomy    . Tubal ligation      Family History  Problem Relation Age of Onset  . Diabetes Mother   . Kidney disease Mother   . Seizures Father   . Hypertension Father   . Stroke Father   . Asthma Other    Social History:  reports that she has been smoking Cigarettes.  She has a .75 pack-year smoking history. She has never used smokeless tobacco. She reports that she drinks alcohol. She reports that she does not use illicit drugs.  Allergies:  Allergies  Allergen Reactions  . Penicillins Shortness Of Breath and Other (See Comments)    Caused yeast infection Has patient had a PCN reaction causing immediate rash, facial/tongue/throat swelling, SOB or lightheadedness with hypotension: Yes Has patient had a PCN reaction causing severe rash involving mucus membranes or skin necrosis: No Has patient had a PCN reaction that required hospitalization pt was in the hospital at the time of the reaction Has patient had a PCN reaction occurring within the last 10 years: No If all of the above answers are "NO", then may proceed with Cephalosp    No prescriptions prior to admission    No results found for this or any previous visit (from the  past 48 hour(s)). No results found.  ROSNO RECENT ILLNESSES OR HOSPITALIZATIONS  There were no vitals taken for this visit. Physical Exam  General Appearance:  Alert, cooperative, no distress, appears stated age  Head:  Normocephalic, without obvious abnormality, atraumatic  Eyes:  Pupils equal, conjunctiva/corneas clear,         Throat: Lips, mucosa, and tongue normal; teeth and gums normal  Neck: No visible masses     Lungs:   respirations unlabored  Chest Wall:  No tenderness or deformity  Heart:  Regular rate and rhythm,  Abdomen:   Soft, non-tender,         Extremities: RIGHT LONG FINGER: MILD DEFORMITY TO LONG FINGER FINGER TIP WARM WELL PERFUSED ABLE TO EXTEND THUMB GOOD DIGITAL MOTION GOOD WRIST MOBILITY  Pulses: 2+ and symmetric  Skin: Skin color, texture, turgor normal, no rashes or lesions     Neurologic: Normal    Assessment/Plan RIGHT LONG FINGER DISPLACED MIDDLE PHALANX FRACTURE  RIGHT LONG FINGER OPEN REDUCTION AND INTERNAL FIXATION,CLOSED REDUCTION AND PINNING AND REPAIR AS INDICATED  R/B/A DISCUSSED WITH PT IN OFFICE.  PT VOICED UNDERSTANDING OF PLAN CONSENT SIGNED DAY OF SURGERY PT SEEN AND EXAMINED PRIOR TO OPERATIVE PROCEDURE/DAY OF SURGERY SITE MARKED. QUESTIONS ANSWERED WILL GO HOME FOLLOWING SURGERY  WE ARE PLANNING SURGERY FOR YOUR UPPER EXTREMITY. THE RISKS AND BENEFITS OF SURGERY INCLUDE BUT NOT LIMITED TO  BLEEDING INFECTION, DAMAGE TO NEARBY NERVES ARTERIES TENDONS, FAILURE OF SURGERY TO ACCOMPLISH ITS INTENDED GOALS, PERSISTENT SYMPTOMS AND NEED FOR FURTHER SURGICAL INTERVENTION. WITH THIS IN MIND WE WILL PROCEED. I HAVE DISCUSSED WITH THE PATIENT THE PRE AND POSTOPERATIVE REGIMEN AND THE DOS AND DON'TS. PT VOICED UNDERSTANDING AND INFORMED CONSENT SIGNED.  Linna Hoff 09/12/2015 AT 1650

## 2015-09-10 NOTE — Progress Notes (Signed)
Patient's sister called requesting I go over instructions for Monday again. I called patient got permission to review instructions with sister and I give the sister the instructions as noted by Helene Kelp. Sister repeated them back to me.

## 2015-09-10 NOTE — Brief Op Note (Signed)
09/12/2015  10:52 AM  PATIENT:  Carol [name]  55 y.o. female  PRE-OPERATIVE DIAGNOSIS:  right middle finger middle phalanx fracture  POST-OPERATIVE DIAGNOSIS:  * No post-op diagnosis entered *  PROCEDURE:  Procedure(s): RIGHT MIDDLE FINGER OPEN REDUCTION INTERNAL FIXATION (ORIF) AND POSSIBLE PINNING (Right)  SURGEON:  Surgeon(s) and Role:    * Iran Planas, MD - Primary  PHYSICIAN ASSISTANT:   ASSISTANTS: none   ANESTHESIA:   general  EBL:     BLOOD ADMINISTERED:none  DRAINS: none   LOCAL MEDICATIONS USED:  MARCAINE     SPECIMEN:  No Specimen  DISPOSITION OF SPECIMEN:  N/A  COUNTS:  YES  TOURNIQUET:  * No tourniquets in log *  DICTATION: .Other Dictation: Dictation Number [id number]  PLAN OF CARE: Discharge to home after PACU  PATIENT DISPOSITION:  PACU - hemodynamically stable.   Delay start of Pharmacological VTE agent (>24hrs) due to surgical blood loss or risk of bleeding: not applicable

## 2015-09-11 MED ORDER — CHLORHEXIDINE GLUCONATE 4 % EX LIQD: 60.0000 mL | Freq: Once | CUTANEOUS | Status: DC

## 2015-09-11 MED ORDER — CLINDAMYCIN PHOSPHATE 900 MG/50ML IV SOLN: 900.0000 mg | INTRAVENOUS | Status: AC

## 2015-09-12 ENCOUNTER — Encounter (HOSPITAL_COMMUNITY)
Admission: RE | Disposition: A | Discharge status: Home / Self Care | Payer: Self-pay | Source: Ambulatory Visit | Attending: Orthopedic Surgery

## 2015-09-12 ENCOUNTER — Ambulatory Visit (HOSPITAL_COMMUNITY): Payer: Medicare HMO | Admitting: Anesthesiology

## 2015-09-12 ENCOUNTER — Ambulatory Visit (HOSPITAL_COMMUNITY)
Admission: RE | Admit: 2015-09-12 | Discharge: 2015-09-12 | Disposition: A | Discharge status: Home / Self Care | Payer: Medicare HMO | Source: Ambulatory Visit | Attending: Orthopedic Surgery | Admitting: Orthopedic Surgery

## 2015-09-12 ENCOUNTER — Encounter (HOSPITAL_COMMUNITY): Payer: Self-pay | Admitting: *Deleted

## 2015-09-12 DIAGNOSIS — K219 Gastro-esophageal reflux disease without esophagitis: Secondary | ICD-10-CM | POA: Insufficient documentation

## 2015-09-12 DIAGNOSIS — S62622A Displaced fracture of medial phalanx of right middle finger, initial encounter for closed fracture: Secondary | ICD-10-CM | POA: Diagnosis present

## 2015-09-12 DIAGNOSIS — F17210 Nicotine dependence, cigarettes, uncomplicated: Secondary | ICD-10-CM | POA: Diagnosis not present

## 2015-09-12 DIAGNOSIS — M549 Dorsalgia, unspecified: Secondary | ICD-10-CM | POA: Insufficient documentation

## 2015-09-12 DIAGNOSIS — J449 Chronic obstructive pulmonary disease, unspecified: Secondary | ICD-10-CM | POA: Diagnosis not present

## 2015-09-12 DIAGNOSIS — X58XXXA Exposure to other specified factors, initial encounter: Secondary | ICD-10-CM | POA: Insufficient documentation

## 2015-09-12 DIAGNOSIS — I10 Essential (primary) hypertension: Secondary | ICD-10-CM | POA: Diagnosis not present

## 2015-09-12 DIAGNOSIS — F329 Major depressive disorder, single episode, unspecified: Secondary | ICD-10-CM | POA: Diagnosis not present

## 2015-09-12 DIAGNOSIS — E119 Type 2 diabetes mellitus without complications: Secondary | ICD-10-CM | POA: Diagnosis not present

## 2015-09-12 DIAGNOSIS — Z6834 Body mass index (BMI) 34.0-34.9, adult: Secondary | ICD-10-CM | POA: Diagnosis not present

## 2015-09-12 DIAGNOSIS — Z794 Long term (current) use of insulin: Secondary | ICD-10-CM | POA: Insufficient documentation

## 2015-09-12 DIAGNOSIS — G8929 Other chronic pain: Secondary | ICD-10-CM | POA: Insufficient documentation

## 2015-09-12 DIAGNOSIS — Z79899 Other long term (current) drug therapy: Secondary | ICD-10-CM | POA: Diagnosis not present

## 2015-09-12 HISTORY — DX: Gastro-esophageal reflux disease without esophagitis: K21.9

## 2015-09-12 HISTORY — DX: Anxiety disorder, unspecified: F41.9

## 2015-09-12 HISTORY — DX: Cardiac murmur, unspecified: R01.1

## 2015-09-12 HISTORY — PX: OPEN REDUCTION INTERNAL FIXATION (ORIF) FINGER WITH RADIAL BONE GRAFT: SHX5666

## 2015-09-12 LAB — COMPREHENSIVE METABOLIC PANEL
ALBUMIN: 3.5 g/dL (ref 3.5–5.0)
ALT: 61 U/L — ABNORMAL HIGH (ref 14–54)
ANION GAP: 14 (ref 5–15)
AST: 76 U/L — ABNORMAL HIGH (ref 15–41)
Alkaline Phosphatase: 132 U/L — ABNORMAL HIGH (ref 38–126)
BILIRUBIN TOTAL: 0.8 mg/dL (ref 0.3–1.2)
BUN: 28 mg/dL — ABNORMAL HIGH (ref 6–20)
CHLORIDE: 100 mmol/L — AB (ref 101–111)
CO2: 24 mmol/L (ref 22–32)
Calcium: 9.7 mg/dL (ref 8.9–10.3)
Creatinine, Ser: 2.73 mg/dL — ABNORMAL HIGH (ref 0.44–1.00)
GFR calc Af Amer: 22 mL/min — ABNORMAL LOW (ref >60)
GFR calc non Af Amer: 19 mL/min — ABNORMAL LOW (ref >60)
GLUCOSE: 102 mg/dL — AB (ref 65–99)
POTASSIUM: 4.3 mmol/L (ref 3.5–5.1)
Sodium: 138 mmol/L (ref 135–145)
TOTAL PROTEIN: 8.4 g/dL — AB (ref 6.5–8.1)

## 2015-09-12 LAB — GLUCOSE, CAPILLARY
GLUCOSE-CAPILLARY: 88 mg/dL (ref 65–99)
GLUCOSE-CAPILLARY: 66 mg/dL (ref 65–99)
Glucose-Capillary: 126 mg/dL — ABNORMAL HIGH (ref 65–99)

## 2015-09-12 LAB — CBC
HEMATOCRIT: 37.5 % (ref 36.0–46.0)
HEMOGLOBIN: 12.9 g/dL (ref 12.0–15.0)
MCH: 30.7 pg (ref 26.0–34.0)
MCHC: 34.4 g/dL (ref 30.0–36.0)
MCV: 89.3 fL (ref 78.0–100.0)
Platelets: 295 10*3/uL (ref 150–400)
RBC: 4.2 MIL/uL (ref 3.87–5.11)
RDW: 13 % (ref 11.5–15.5)
WBC: 8.7 10*3/uL (ref 4.0–10.5)

## 2015-09-12 LAB — PROTIME-INR
INR: 1.17 (ref 0.00–1.49)
Prothrombin Time: 15 seconds (ref 11.6–15.2)

## 2015-09-12 SURGERY — OPEN REDUCTION INTERNAL FIXATION (ORIF) FINGER WITH RADIAL BONE GRAFT
Anesthesia: General | Site: Finger | Laterality: Right

## 2015-09-12 MED ORDER — MIDAZOLAM HCL 2 MG/2ML IJ SOLN
INTRAMUSCULAR | Status: AC
  Filled 2015-09-12: qty 2

## 2015-09-12 MED ORDER — LIDOCAINE HCL (CARDIAC) 20 MG/ML IV SOLN
INTRAVENOUS | Status: DC | PRN
  Administered 2015-09-12: 20 mg via INTRAVENOUS

## 2015-09-12 MED ORDER — BUPIVACAINE HCL (PF) 0.25 % IJ SOLN
INTRAMUSCULAR | Status: AC
  Filled 2015-09-12: qty 30

## 2015-09-12 MED ORDER — FENTANYL CITRATE (PF) 100 MCG/2ML IJ SOLN
INTRAMUSCULAR | Status: AC
  Administered 2015-09-12: 50 ug via INTRAVENOUS
  Filled 2015-09-12: qty 2

## 2015-09-12 MED ORDER — OXYCODONE-ACETAMINOPHEN 5-325 MG PO TABS
1.0000 | ORAL_TABLET | Freq: Four times a day (QID) | ORAL | Status: DC | PRN
  Administered 2015-09-12: 2 via ORAL

## 2015-09-12 MED ORDER — FENTANYL CITRATE (PF) 100 MCG/2ML IJ SOLN
25.0000 ug | INTRAMUSCULAR | Status: DC | PRN
  Administered 2015-09-12: 50 ug via INTRAVENOUS
  Administered 2015-09-12: 25 ug via INTRAVENOUS
  Administered 2015-09-12: 50 ug via INTRAVENOUS

## 2015-09-12 MED ORDER — PHENYLEPHRINE HCL 10 MG/ML IJ SOLN
INTRAMUSCULAR | Status: DC | PRN
  Administered 2015-09-12 (×3): 120 ug via INTRAVENOUS
  Administered 2015-09-12: 160 ug via INTRAVENOUS

## 2015-09-12 MED ORDER — LACTATED RINGERS IV BOLUS (SEPSIS)
200.0000 mL | Freq: Once | INTRAVENOUS | Status: AC
  Administered 2015-09-12: 200 mL via INTRAVENOUS

## 2015-09-12 MED ORDER — PROPOFOL 10 MG/ML IV BOLUS
INTRAVENOUS | Status: AC
  Filled 2015-09-12: qty 40

## 2015-09-12 MED ORDER — PROPOFOL 10 MG/ML IV BOLUS
INTRAVENOUS | Status: DC | PRN
  Administered 2015-09-12: 30 mg via INTRAVENOUS
  Administered 2015-09-12: 130 mg via INTRAVENOUS

## 2015-09-12 MED ORDER — FENTANYL CITRATE (PF) 250 MCG/5ML IJ SOLN
INTRAMUSCULAR | Status: AC
  Filled 2015-09-12: qty 5

## 2015-09-12 MED ORDER — ROCURONIUM BROMIDE 50 MG/5ML IV SOLN
INTRAVENOUS | Status: AC
  Filled 2015-09-12: qty 1

## 2015-09-12 MED ORDER — CLINDAMYCIN PHOSPHATE 900 MG/50ML IV SOLN
INTRAVENOUS | Status: AC
  Administered 2015-09-12: 900 mg via INTRAVENOUS
  Filled 2015-09-12: qty 50

## 2015-09-12 MED ORDER — PROMETHAZINE HCL 25 MG/ML IJ SOLN: 6.2500 mg | INTRAMUSCULAR | Status: DC | PRN

## 2015-09-12 MED ORDER — OXYCODONE-ACETAMINOPHEN 5-325 MG PO TABS
ORAL_TABLET | ORAL | Status: AC
  Administered 2015-09-12: 2 via ORAL
  Filled 2015-09-12: qty 2

## 2015-09-12 MED ORDER — EPHEDRINE SULFATE 50 MG/ML IJ SOLN
INTRAMUSCULAR | Status: DC | PRN
  Administered 2015-09-12: 20 mg via INTRAVENOUS
  Administered 2015-09-12: 10 mg via INTRAVENOUS

## 2015-09-12 MED ORDER — SUCCINYLCHOLINE CHLORIDE 20 MG/ML IJ SOLN
INTRAMUSCULAR | Status: DC | PRN
  Administered 2015-09-12: 100 mg via INTRAVENOUS

## 2015-09-12 MED ORDER — MIDAZOLAM HCL 5 MG/5ML IJ SOLN
INTRAMUSCULAR | Status: DC | PRN
  Administered 2015-09-12 (×2): 1 mg via INTRAVENOUS

## 2015-09-12 MED ORDER — DOCUSATE SODIUM 100 MG PO CAPS: 100.0000 mg | ORAL_CAPSULE | Freq: Two times a day (BID) | ORAL | Status: DC

## 2015-09-12 MED ORDER — ONDANSETRON HCL 4 MG/2ML IJ SOLN
INTRAMUSCULAR | Status: DC | PRN
  Administered 2015-09-12: 4 mg via INTRAVENOUS

## 2015-09-12 MED ORDER — MEPERIDINE HCL 25 MG/ML IJ SOLN: 6.2500 mg | INTRAMUSCULAR | Status: DC | PRN

## 2015-09-12 MED ORDER — LACTATED RINGERS IV SOLN
INTRAVENOUS | Status: DC
  Administered 2015-09-12 (×2): via INTRAVENOUS

## 2015-09-12 MED ORDER — OXYCODONE-ACETAMINOPHEN 5-325 MG PO TABS: 1.0000 | ORAL_TABLET | ORAL | Status: DC | PRN

## 2015-09-12 MED ORDER — BUPIVACAINE HCL (PF) 0.25 % IJ SOLN
INTRAMUSCULAR | Status: DC | PRN
Start: 2015-09-12 — End: 2015-09-12
  Administered 2015-09-12: 9 mL

## 2015-09-12 MED ORDER — FENTANYL CITRATE (PF) 100 MCG/2ML IJ SOLN
INTRAMUSCULAR | Status: AC
  Filled 2015-09-12: qty 2

## 2015-09-12 MED ORDER — MIDAZOLAM HCL 2 MG/2ML IJ SOLN: 0.5000 mg | Freq: Once | INTRAMUSCULAR | Status: DC | PRN

## 2015-09-12 SURGICAL SUPPLY — 56 items
BANDAGE ELASTIC 3 VELCRO ST LF (GAUZE/BANDAGES/DRESSINGS) IMPLANT
BANDAGE ELASTIC 4 VELCRO ST LF (GAUZE/BANDAGES/DRESSINGS) IMPLANT
BLADE SURG ROTATE 9660 (MISCELLANEOUS) IMPLANT
BNDG COHESIVE 1X5 TAN STRL LF (GAUZE/BANDAGES/DRESSINGS) ×3 IMPLANT
BNDG CONFORM 2 STRL LF (GAUZE/BANDAGES/DRESSINGS) ×3 IMPLANT
BNDG ESMARK 4X9 LF (GAUZE/BANDAGES/DRESSINGS) ×3 IMPLANT
BNDG GAUZE ELAST 4 BULKY (GAUZE/BANDAGES/DRESSINGS) IMPLANT
CLOSURE WOUND 1/2 X4 (GAUZE/BANDAGES/DRESSINGS)
CORDS BIPOLAR (ELECTRODE) ×3 IMPLANT
COVER SURGICAL LIGHT HANDLE (MISCELLANEOUS) ×3 IMPLANT
CUFF TOURNIQUET SINGLE 18IN (TOURNIQUET CUFF) ×3 IMPLANT
CUFF TOURNIQUET SINGLE 24IN (TOURNIQUET CUFF) IMPLANT
DRAIN TLS ROUND 10FR (DRAIN) IMPLANT
DRAPE OEC MINIVIEW 54X84 (DRAPES) ×3 IMPLANT
DRAPE SURG 17X11 SM STRL (DRAPES) IMPLANT
DRSG ADAPTIC 3X8 NADH LF (GAUZE/BANDAGES/DRESSINGS) IMPLANT
ELECT REM PT RETURN 9FT ADLT (ELECTROSURGICAL)
ELECTRODE REM PT RTRN 9FT ADLT (ELECTROSURGICAL) IMPLANT
GAUZE SPONGE 4X4 12PLY STRL (GAUZE/BANDAGES/DRESSINGS) IMPLANT
GAUZE SPONGE 4X4 16PLY XRAY LF (GAUZE/BANDAGES/DRESSINGS) IMPLANT
GAUZE XEROFORM 1X8 LF (GAUZE/BANDAGES/DRESSINGS) ×3 IMPLANT
GLOVE BIOGEL PI IND STRL 8.5 (GLOVE) ×1 IMPLANT
GLOVE BIOGEL PI INDICATOR 8.5 (GLOVE) ×2
GLOVE SURG ORTHO 8.0 STRL STRW (GLOVE) ×3 IMPLANT
GOWN STRL REUS W/ TWL LRG LVL3 (GOWN DISPOSABLE) ×2 IMPLANT
GOWN STRL REUS W/ TWL XL LVL3 (GOWN DISPOSABLE) ×1 IMPLANT
GOWN STRL REUS W/TWL LRG LVL3 (GOWN DISPOSABLE) ×4
GOWN STRL REUS W/TWL XL LVL3 (GOWN DISPOSABLE) ×2
K-WIRE .045 CH (WIRE) ×6
KIT BASIN OR (CUSTOM PROCEDURE TRAY) ×3 IMPLANT
KIT ROOM TURNOVER OR (KITS) ×3 IMPLANT
KWIRE .045 CH (WIRE) ×2 IMPLANT
MANIFOLD NEPTUNE II (INSTRUMENTS) IMPLANT
NEEDLE HYPO 25X1 1.5 SAFETY (NEEDLE) ×3 IMPLANT
NS IRRIG 1000ML POUR BTL (IV SOLUTION) ×3 IMPLANT
PACK ORTHO EXTREMITY (CUSTOM PROCEDURE TRAY) ×3 IMPLANT
PAD ARMBOARD 7.5X6 YLW CONV (MISCELLANEOUS) ×9 IMPLANT
PAD CAST 4YDX4 CTTN HI CHSV (CAST SUPPLIES) IMPLANT
PADDING CAST COTTON 4X4 STRL (CAST SUPPLIES)
SOAP 2 % CHG 4 OZ (WOUND CARE) ×3 IMPLANT
SPONGE GAUZE 4X4 12PLY STER LF (GAUZE/BANDAGES/DRESSINGS) ×3 IMPLANT
STRIP CLOSURE SKIN 1/2X4 (GAUZE/BANDAGES/DRESSINGS) IMPLANT
SUCTION FRAZIER TIP 10 FR DISP (SUCTIONS) ×3 IMPLANT
SUT ETHILON 4 0 PS 2 18 (SUTURE) IMPLANT
SUT MNCRL AB 4-0 PS2 18 (SUTURE) IMPLANT
SUT PROLENE 6 0 P 3 18 (SUTURE) IMPLANT
SUT VIC AB 2-0 FS1 27 (SUTURE) IMPLANT
SUT VICRYL 4-0 PS2 18IN ABS (SUTURE) IMPLANT
SYR CONTROL 10ML LL (SYRINGE) IMPLANT
SYSTEM CHEST DRAIN TLS 7FR (DRAIN) IMPLANT
TOWEL OR 17X24 6PK STRL BLUE (TOWEL DISPOSABLE) ×3 IMPLANT
TOWEL OR 17X26 10 PK STRL BLUE (TOWEL DISPOSABLE) ×3 IMPLANT
TUBE CONNECTING 12'X1/4 (SUCTIONS) ×1
TUBE CONNECTING 12X1/4 (SUCTIONS) ×2 IMPLANT
WATER STERILE IRR 1000ML POUR (IV SOLUTION) IMPLANT
YANKAUER SUCT BULB TIP NO VENT (SUCTIONS) IMPLANT

## 2015-09-12 NOTE — Anesthesia Postprocedure Evaluation (Signed)
Anesthesia Post Note  Patient: Carol [name]  Procedure(s) Performed: Procedure(s) (LRB): RIGHT MIDDLE FINGER CLOSED REDUCTION AND PINNING (Right)  Patient location during evaluation: PACU Anesthesia Type: General Level of consciousness: awake and alert, oriented and patient cooperative Pain management: pain level controlled Vital Signs Assessment: post-procedure vital signs reviewed and stable Respiratory status: spontaneous breathing, nonlabored ventilation and respiratory function stable Cardiovascular status: blood pressure returned to baseline and stable Postop Assessment: no signs of nausea or vomiting Anesthetic complications: no    Last Vitals:  Filed Vitals:   09/12/15 1830 09/12/15 1845  BP: 99/66 101/74  Pulse: 72 85  Temp:    Resp: 15 18    Last Pain:  Filed Vitals:   09/12/15 1858  PainSc: 0-No pain                 Donyetta Ogletree,E. Ardith Test

## 2015-09-12 NOTE — Transfer of Care (Signed)
Immediate Anesthesia Transfer of Care Note  Patient: Carol [name]  Procedure(s) Performed: Procedure(s): RIGHT MIDDLE FINGER CLOSED REDUCTION AND PINNING (Right)  Patient Location: PACU  Anesthesia Type:General  Level of Consciousness: awake, sedated and patient cooperative  Airway & Oxygen Therapy: Patient Spontanous Breathing and Patient connected to nasal cannula oxygen  Post-op Assessment: Report given to RN and Post -op Vital signs reviewed and stable  Post vital signs: Reviewed and stable  Last Vitals:  Filed Vitals:   09/12/15 1548 09/12/15 1700  BP: 101/57 119/72  Pulse: 62   Temp:    Resp:      Complications: No apparent anesthesia complications

## 2015-09-12 NOTE — Progress Notes (Signed)
CBG 66 on arrival to PACU. Pt given regular sprite.  CBG 126 when re-checked

## 2015-09-12 NOTE — Op Note (Signed)
NAMEMarland Kitchen  NIURKA, HOUFEK NO.:  [id number]  MEDICAL RECORD NO.:  [id number]  LOCATION:  [facility]                         FACILITY:  [place]  PHYSICIAN:  Linna Hoff IV, M.D.DATE OF BIRTH:  Jul 28, 1961  DATE OF PROCEDURE:  09/12/2015 DATE OF DISCHARGE:  09/12/2015                              OPERATIVE REPORT   PREOPERATIVE DIAGNOSIS:  Right displaced middle phalanx fracture, middle finger.  POSTOPERATIVE DIAGNOSIS:  Right displaced middle phalanx fracture, middle finger.  SURGEON:  Linna Hoff, M.D., who scrubbed and present for the entire procedure.  ASSISTANT SURGEON:  None.  ANESTHESIA:  General via LMA, endotracheal tube.  PROCEDURE: 1. Closed reduction and percutaneous K-wire fixation for unstable     middle phalangeal shaft fracture, right long finger. 2. Radiographs 2 views, right long finger.  RADIOGRAPHIC INTERPRETATION:  AP and lateral views of the long finger did show the K-wire fixation in good position.  SURGICAL IMPLANTS:  Two 0.045 K-wires.  SURGICAL INDICATIONS:  Ms. Loder is a right-hand dominant female, who sustained a closed injury to her right long finger.  The patient was seen and evaluated in the office; and given the nature of her injury, recommended to undergo the above procedure.  Risks, benefits, and alternatives were discussed in detail with the patient.  Signed informed consent was obtained.  Risks include, but not limited to bleeding; infection; damage to nearby nerves, arteries, or tendons; loss of motion of wrist and digits; incomplete relief of symptoms; need for further surgical intervention.  DESCRIPTION OF PROCEDURE:  The patient was properly identified in the preoperative holding area, marked with a permanent marker made on the right long finger to indicate correct operative site.  The patient was brought back to the operating room, placed supine on the anesthesia table.  General anesthetic was administered.  The  patient tolerated this well.  Preoperative antibiotics were given.  A well-padded tourniquet was placed in the right brachium, sealed with 1000 drape.  The right upper extremity was then prepped and draped in normal sterile fashion. Time-out was called, correct site was identified, and procedure then begun.  Attention then turned to the right long finger.  The limb was then elevated.  Closed manipulation was then performed.  This was successful.  Following this, two 0.045 K-wires were then placed in a crossed fashion from distal to proximal direction with good purchase. The K-wire was then cut and bent, left out of the skin.  Final radiographs were obtained.  The patient then placed in a well-padded finger splint.  Extubated and taken to recovery room in good condition.  POSTPROCEDURE PLAN:  The patient discharged home, seen back in the office approximately 2 weeks for x-rays, out of the splint, back and removal of finger splint, with therapy and then see her back at the 4- week mark.  K-wires out at the 4-week mark and then begin more aggressive outpatient therapy regimen.  Radiographs at each visit.     Melrose Nakayama, M.D.     FWO/MEDQ  D:  09/12/2015  T:  09/12/2015  Job:  [id number]

## 2015-09-12 NOTE — Anesthesia Procedure Notes (Signed)
Procedure Name: Intubation Date/Time: 09/12/2015 5:18 PM Performed by: Layla Maw Pre-anesthesia Checklist: Patient identified, Patient being monitored, Timeout performed, Emergency Drugs available and Suction available Patient Re-evaluated:Patient Re-evaluated prior to inductionOxygen Delivery Method: Circle System Utilized Preoxygenation: Pre-oxygenation with 100% oxygen Intubation Type: IV induction, Cricoid Pressure applied and Rapid sequence Laryngoscope Size: Miller and 3 Grade View: Grade I Tube type: Oral Tube size: 7.5 mm Number of attempts: 1 Airway Equipment and Method: Stylet Placement Confirmation: ETT inserted through vocal cords under direct vision,  positive ETCO2 and breath sounds checked- equal and bilateral Secured at: 21 cm Tube secured with: Tape Dental Injury: Teeth and Oropharynx as per pre-operative assessment

## 2015-09-12 NOTE — Anesthesia Preprocedure Evaluation (Addendum)
Anesthesia Evaluation  Patient identified by MRN, date of birth, ID band Patient awake    Reviewed: Allergy & Precautions, NPO status , Patient's Chart, lab work & pertinent test results, reviewed documented beta blocker date and time   History of Anesthesia Complications Negative for: history of anesthetic complications  Airway Mallampati: II  TM Distance: >3 FB Neck ROM: Full    Dental  (+) Dental Advisory Given, Chipped   Pulmonary COPD,  COPD inhaler, Current Smoker,    breath sounds clear to auscultation       Cardiovascular hypertension, Pt. on medications and Pt. on home beta blockers (-) angina Rhythm:Regular Rate:Normal  '15 ECHO: EF 65%, valves OK   Neuro/Psych Anxiety Depression negative neurological ROS     GI/Hepatic GERD  Medicated and Poorly Controlled,(+) Hepatitis - (elevated LFTs)  Endo/Other  diabetes (glu 102), Insulin Dependent, Oral Hypoglycemic AgentsMorbid obesity  Renal/GU Renal InsufficiencyRenal disease (creat 2.73)     Musculoskeletal   Abdominal (+) + obese,   Peds  Hematology   Anesthesia Other Findings   Reproductive/Obstetrics                          Anesthesia Physical Anesthesia Plan  ASA: III  Anesthesia Plan: General   Post-op Pain Management:    Induction: Intravenous  Airway Management Planned: Oral ETT  Additional Equipment:   Intra-op Plan:   Post-operative Plan: Extubation in OR  Informed Consent: I have reviewed the patients History and Physical, chart, labs and discussed the procedure including the risks, benefits and alternatives for the proposed anesthesia with the patient or authorized representative who has indicated his/her understanding and acceptance.   Dental advisory given  Plan Discussed with: CRNA and Surgeon  Anesthesia Plan Comments: (Plan routine monitors, GETA)        Anesthesia Quick Evaluation

## 2015-09-12 NOTE — Progress Notes (Signed)
Dr. Glennon Mac notified of BP order given for 200 mL bolus.

## 2015-09-12 NOTE — Discharge Instructions (Signed)
KEEP BANDAGE CLEAN AND DRY CALL OFFICE FOR F/U APPT [phone] IN 14 DAYS KEEP HAND ELEVATED ABOVE HEART OK TO APPLY ICE TO OPERATIVE AREA CONTACT OFFICE IF ANY WORSENING PAIN OR CONCERNS. 

## 2015-09-12 NOTE — Progress Notes (Signed)
D/c instructions given to pt & her sister. Minimal pain. Assisted up to bathroom. Pt will be ready to go home after getting dressed. Report given to Colletta [place], Therapist, sports.

## 2015-09-13 ENCOUNTER — Encounter (HOSPITAL_COMMUNITY): Payer: Self-pay | Admitting: Orthopedic Surgery

## 2016-03-06 ENCOUNTER — Other Ambulatory Visit: Payer: Self-pay | Admitting: Internal Medicine

## 2016-03-06 DIAGNOSIS — Z1231 Encounter for screening mammogram for malignant neoplasm of breast: Secondary | ICD-10-CM

## 2016-03-16 ENCOUNTER — Ambulatory Visit: Payer: Medicare Other

## 2016-03-21 ENCOUNTER — Ambulatory Visit: Payer: Medicare Other

## 2016-04-06 ENCOUNTER — Ambulatory Visit: Payer: Medicare Other

## 2016-04-10 ENCOUNTER — Ambulatory Visit: Payer: Medicare Other

## 2016-05-04 ENCOUNTER — Ambulatory Visit: Payer: Medicare Other

## 2016-05-08 LAB — LAB REPORT - SCANNED: PAP SMEAR: NEGATIVE

## 2016-06-20 ENCOUNTER — Ambulatory Visit: Payer: Medicare Other

## 2016-06-29 ENCOUNTER — Ambulatory Visit: Payer: Medicare Other

## 2016-07-11 ENCOUNTER — Other Ambulatory Visit: Payer: Self-pay | Admitting: Internal Medicine

## 2016-07-11 DIAGNOSIS — E2839 Other primary ovarian failure: Secondary | ICD-10-CM

## 2016-07-18 ENCOUNTER — Ambulatory Visit: Payer: Medicare Other

## 2016-09-28 ENCOUNTER — Ambulatory Visit: Payer: Medicare Other

## 2016-11-06 ENCOUNTER — Ambulatory Visit: Payer: Medicare Other

## 2016-12-26 ENCOUNTER — Ambulatory Visit: Payer: Medicare Other

## 2017-03-21 ENCOUNTER — Emergency Department (HOSPITAL_COMMUNITY)
Admission: EM | Admit: 2017-03-21 | Discharge: 2017-03-21 | Disposition: A | Discharge status: Home / Self Care | Payer: Medicare HMO | Attending: Physician Assistant | Admitting: Physician Assistant

## 2017-03-21 ENCOUNTER — Encounter (HOSPITAL_COMMUNITY): Payer: Self-pay | Admitting: Emergency Medicine

## 2017-03-21 DIAGNOSIS — F17210 Nicotine dependence, cigarettes, uncomplicated: Secondary | ICD-10-CM | POA: Diagnosis not present

## 2017-03-21 DIAGNOSIS — M545 Low back pain, unspecified: Secondary | ICD-10-CM

## 2017-03-21 DIAGNOSIS — E119 Type 2 diabetes mellitus without complications: Secondary | ICD-10-CM | POA: Insufficient documentation

## 2017-03-21 DIAGNOSIS — Z794 Long term (current) use of insulin: Secondary | ICD-10-CM | POA: Insufficient documentation

## 2017-03-21 DIAGNOSIS — G8929 Other chronic pain: Secondary | ICD-10-CM | POA: Insufficient documentation

## 2017-03-21 DIAGNOSIS — N390 Urinary tract infection, site not specified: Secondary | ICD-10-CM | POA: Diagnosis not present

## 2017-03-21 DIAGNOSIS — Z79899 Other long term (current) drug therapy: Secondary | ICD-10-CM | POA: Diagnosis not present

## 2017-03-21 LAB — CBC WITH DIFFERENTIAL/PLATELET
BASOS ABS: 0 10*3/uL (ref 0.0–0.1)
Basophils Relative: 0 %
EOS PCT: 8 %
Eosinophils Absolute: 0.7 10*3/uL (ref 0.0–0.7)
HEMATOCRIT: 36.1 % (ref 36.0–46.0)
Hemoglobin: 12.4 g/dL (ref 12.0–15.0)
LYMPHS PCT: 34 %
Lymphs Abs: 2.7 10*3/uL (ref 0.7–4.0)
MCH: 29.1 pg (ref 26.0–34.0)
MCHC: 34.3 g/dL (ref 30.0–36.0)
MCV: 84.7 fL (ref 78.0–100.0)
MONO ABS: 0.8 10*3/uL (ref 0.1–1.0)
Monocytes Relative: 9 %
NEUTROS ABS: 3.8 10*3/uL (ref 1.7–7.7)
Neutrophils Relative %: 49 %
PLATELETS: 241 10*3/uL (ref 150–400)
RBC: 4.26 MIL/uL (ref 3.87–5.11)
RDW: 14.1 % (ref 11.5–15.5)
WBC: 8 10*3/uL (ref 4.0–10.5)

## 2017-03-21 LAB — COMPREHENSIVE METABOLIC PANEL
ALBUMIN: 3.5 g/dL (ref 3.5–5.0)
ALT: 25 U/L (ref 14–54)
AST: 43 U/L — AB (ref 15–41)
Alkaline Phosphatase: 130 U/L — ABNORMAL HIGH (ref 38–126)
Anion gap: 11 (ref 5–15)
BILIRUBIN TOTAL: 0.8 mg/dL (ref 0.3–1.2)
BUN: 15 mg/dL (ref 6–20)
CHLORIDE: 103 mmol/L (ref 101–111)
CO2: 25 mmol/L (ref 22–32)
CREATININE: 1.29 mg/dL — AB (ref 0.44–1.00)
Calcium: 9.3 mg/dL (ref 8.9–10.3)
GFR calc Af Amer: 53 mL/min — ABNORMAL LOW (ref >60)
GFR, EST NON AFRICAN AMERICAN: 46 mL/min — AB (ref >60)
GLUCOSE: 256 mg/dL — AB (ref 65–99)
POTASSIUM: 3.6 mmol/L (ref 3.5–5.1)
Sodium: 139 mmol/L (ref 135–145)
Total Protein: 7.9 g/dL (ref 6.5–8.1)

## 2017-03-21 LAB — URINALYSIS, ROUTINE W REFLEX MICROSCOPIC
Bilirubin Urine: NEGATIVE
Glucose, UA: 150 mg/dL — AB
Ketones, ur: NEGATIVE mg/dL
Nitrite: POSITIVE — AB
PROTEIN: NEGATIVE mg/dL
Specific Gravity, Urine: 1.01 (ref 1.005–1.030)
pH: 5 (ref 5.0–8.0)

## 2017-03-21 LAB — AMMONIA: AMMONIA: 49 umol/L — AB (ref 9–35)

## 2017-03-21 LAB — RAPID URINE DRUG SCREEN, HOSP PERFORMED
AMPHETAMINES: NOT DETECTED
Barbiturates: NOT DETECTED
Benzodiazepines: POSITIVE — AB
Cocaine: NOT DETECTED
Opiates: POSITIVE — AB
TETRAHYDROCANNABINOL: NOT DETECTED

## 2017-03-21 LAB — CBG MONITORING, ED: Glucose-Capillary: 204 mg/dL — ABNORMAL HIGH (ref 65–99)

## 2017-03-21 LAB — ETHANOL

## 2017-03-21 MED ORDER — CIPROFLOXACIN HCL 500 MG PO TABS
500.0000 mg | ORAL_TABLET | Freq: Once | ORAL | Status: AC
  Administered 2017-03-21: 500 mg via ORAL
  Filled 2017-03-21: qty 1

## 2017-03-21 MED ORDER — SODIUM CHLORIDE 0.9 % IV SOLN: INTRAVENOUS | Status: DC

## 2017-03-21 MED ORDER — SODIUM CHLORIDE 0.9 % IV BOLUS (SEPSIS)
500.0000 mL | Freq: Once | INTRAVENOUS | Status: AC
  Administered 2017-03-21: 500 mL via INTRAVENOUS

## 2017-03-21 MED ORDER — KETOROLAC TROMETHAMINE 30 MG/ML IJ SOLN
30.0000 mg | Freq: Once | INTRAMUSCULAR | Status: AC
Start: 2017-03-21 — End: 2017-03-21
  Administered 2017-03-21: 30 mg via INTRAVENOUS
  Filled 2017-03-21: qty 1

## 2017-03-21 NOTE — ED Notes (Signed)
RN Learta Codding made two IV attempts in RAC and RFA.

## 2017-03-21 NOTE — ED Triage Notes (Addendum)
Pt arrived via [facility] , c/o low back pain since Sunday. Denies injury. Pt appears very sleepy and has difficulty opening eyes.

## 2017-03-21 NOTE — ED Notes (Signed)
Pt was given chicken noodle soup, [place] sandwich and sprite for po/fluid challenge.

## 2017-03-21 NOTE — ED Provider Notes (Signed)
[facility] DEPT Provider Note   CSN: [id number] Arrival date & time: 03/21/17  1407     History   Chief Complaint Chief Complaint  Patient presents with  . Back Pain    HPI Carol [name] is a 56 y.o. female.  The patient presents for evaluation of back pain.  She states the back pain has been present for several days, and does not improve with her usual narcotic pain medicines.  She also states she has been more sleepy than usual he recently.  She denies fever, chills, nausea, vomiting, weakness or dizziness.  She is taking her usual medications.  There are no other known modifying factors.  HPI  Past Medical History:  Diagnosis Date  . Abnormal liver function tests   . Acute kidney injury ([place]) 01/2014   Hospitalized, Volume depletion, nausea and vomiting  . Anxiety   . 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 Chronic back pain   . Depression   . Diabetes mellitus   . GERD (gastroesophageal reflux disease)   . Heart murmur   . Hypertension   . Palpitations     Patient Active Problem List   Diagnosis Date Noted  . Colon cancer screening 02/18/2014  . Diabetes mellitus ([place]) 01/29/2014  . Hypertension 01/29/2014  . Closed jaw fracture ([place]) 01/29/2014  . Paresthesia 01/29/2014  . Syncope 01/29/2014  . 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 CARPAL TUNNEL RELEASE    . OPEN REDUCTION INTERNAL FIXATION (ORIF) FINGER WITH RADIAL BONE GRAFT Right 09/12/2015   Procedure: RIGHT MIDDLE FINGER CLOSED REDUCTION AND PINNING;  Surgeon: Iran Planas, MD;  Location: [facility];  Service: Orthopedics;  Laterality: Right;  . TONSILLECTOMY    . TUBAL LIGATION      OB History    No data available       Home Medications    Prior to Admission medications   Medication Sig Start Date End Date Taking? Authorizing Provider  albuterol (PROVENTIL HFA;VENTOLIN HFA) 108 (90 Base) MCG/ACT inhaler  Inhale 4 puffs into the lungs 2 (two) times daily.    [provider]  ALPRAZolam Duanne Moron) 1 MG tablet Take 1 mg by mouth 2 (two) times daily as needed for anxiety.  09/02/15   [provider]  Azilsartan-Chlorthalidone (EDARBYCLOR) 40-12.5 MG TABS Take 1 tablet by mouth daily.    [provider]  cyclobenzaprine (FLEXERIL) 10 MG tablet Take 10 mg by mouth 3 (three) times daily as needed for muscle spasms.    [provider]  dicyclomine (BENTYL) 20 MG tablet Take 1 tablet (20 mg total) by mouth every 6 (six) hours. Patient taking differently: Take 20 mg by mouth every 6 (six) hours as needed for spasms.  07/30/14   Gatha Mayer, MD  diphenhydramine-acetaminophen (TYLENOL PM) 25-500 MG TABS Take 2 tablets by mouth at bedtime as needed (For sleeping disorder.).     [provider]  docusate sodium (COLACE) 100 MG capsule Take 1 capsule (100 mg total) by mouth 2 (two) times daily. 09/12/15   Iran Planas, MD  gabapentin (NEURONTIN) 100 MG capsule Take 1 capsule (100 mg total) by mouth at bedtime. Patient not taking: Reported on 09/09/2015 02/02/14   Regalado, Jerald Kief A, MD  glimepiride (AMARYL) 4 MG tablet Take 4 mg by mouth 2 (two) times daily.    [provider]  hydrOXYzine (ATARAX/VISTARIL) 25 MG tablet Take 25 mg by mouth daily.  [provider]  insulin aspart (NOVOLOG FLEXPEN) 100 UNIT/ML FlexPen Inject 18 Units into the skin 3 (three) times daily after meals.    [provider]  insulin glargine (LANTUS) 100 UNIT/ML injection Inject 0.25 mLs (25 Units total) into the skin daily. Patient not taking: Reported on 09/09/2015 02/02/14   Regalado, Jerald Kief A, MD  insulin glargine (LANTUS) 100 unit/mL SOPN Inject 50 Units into the skin daily after breakfast.    [provider]  metFORMIN (GLUCOPHAGE) 500 MG tablet Take 500 mg by mouth daily before breakfast.     [provider]  nebivolol (BYSTOLIC) 10 MG tablet  Take 1 tablet (10 mg total) by mouth daily. 05/16/15   Antonietta Breach, PA-C  ondansetron (ZOFRAN) 4 MG/2ML SOLN injection Inject 2 mLs (4 mg total) into the vein every 6 (six) hours as needed for nausea or vomiting. Patient taking differently: Take 4 mg by mouth every 6 (six) hours as needed for nausea or vomiting.  02/02/14   Regalado, Belkys A, MD  oxyCODONE-acetaminophen (PERCOCET/ROXICET) 5-325 MG tablet Take 1-2 tablets by mouth every 6 (six) hours as needed for severe pain. 08/31/15   Cartner, Marland Kitchen, PA-C  oxyCODONE-acetaminophen (ROXICET) 5-325 MG tablet Take 1 tablet by mouth every 4 (four) hours as needed for severe pain. 09/12/15   Iran Planas, MD  pantoprazole (PROTONIX) 40 MG tablet Take 1 tablet (40 mg total) by mouth daily at 12 noon. Patient taking differently: Take 40 mg by mouth daily as needed (acid reflux).  02/02/14   Regalado, Belkys A, MD  pregabalin (LYRICA) 75 MG capsule Take 75 mg by mouth 2 (two) times daily.    [provider]  QUEtiapine Fumarate (SEROQUEL XR) 150 MG 24 hr tablet Take 150 mg by mouth daily.     [provider]  sertraline (ZOLOFT) 25 MG tablet Take 25 mg by mouth daily. 09/02/15   [provider]  simvastatin (ZOCOR) 10 MG tablet Take 10 mg by mouth at bedtime.     [provider]  triamcinolone ointment (KENALOG) 0.1 % Apply 1 application topically 2 (two) times daily as needed (Apply to feet after showering for dry skin.).     [provider]    Family History Family History  Problem Relation Age of Onset  . Diabetes Mother   . Kidney disease Mother   . Seizures Father   . Hypertension Father   . Stroke Father   . Asthma Other     Social History Social History  Substance Use Topics  . Smoking status: Current Some Day Smoker    Packs/day: 0.25    Years: 3.00    Types: Cigarettes  . Smokeless tobacco: Never Used  . Alcohol use 0.0 oz/week     Comment: occasional     Allergies    Penicillins   Review of Systems Review of Systems  All other systems reviewed and are negative.    Physical Exam Updated Vital Signs BP 125/80 (BP Location: Right Arm)   Pulse 65   Temp 98 F (36.7 C) (Oral)   Resp 16   Ht 5\' 4"  (1.626 m)   Wt 99.8 kg (220 lb)   SpO2 98%   BMI 37.76 kg/m   Physical Exam  Constitutional: She is oriented to person, place, and time. She appears well-developed and well-nourished.  HENT:  Head: Normocephalic and atraumatic.  Eyes: Pupils are equal, round, and reactive to light. Conjunctivae and EOM are normal.  Neck: Normal range of  motion and phonation normal. Neck supple.  Cardiovascular: Normal rate and regular rhythm.   Pulmonary/Chest: Effort normal and breath sounds normal. She exhibits no tenderness.  Abdominal: Soft. She exhibits no distension. There is no tenderness. There is no guarding.  Musculoskeletal: Normal range of motion.  Mild diffuse lumbar tenderness, she is able to roll over for evaluation without significant pain.  Neurological: She is alert and oriented to person, place, and time. She exhibits normal muscle tone.  Dysarthria consistent with narcotic intoxication.  Skin: Skin is warm and dry.  Psychiatric: She has a normal mood and affect. Her behavior is normal. Judgment and thought content normal.  Nursing note and vitals reviewed.    ED Treatments / Results  Labs (all labs ordered are listed, but only abnormal results are displayed) Labs Reviewed  RAPID URINE DRUG SCREEN, HOSP PERFORMED - Abnormal; Notable for the following:       Result Value   Opiates POSITIVE (*)    Benzodiazepines POSITIVE (*)    All other components within normal limits  URINALYSIS, ROUTINE W REFLEX MICROSCOPIC - Abnormal; Notable for the following:    APPearance HAZY (*)    Glucose, UA 150 (*)    Hgb urine dipstick SMALL (*)    Nitrite POSITIVE (*)    Leukocytes, UA MODERATE (*)    Bacteria, UA MANY (*)    Squamous Epithelial / LPF  0-5 (*)    All other components within normal limits  CBG MONITORING, ED - Abnormal; Notable for the following:    Glucose-Capillary 204 (*)    All other components within normal limits  COMPREHENSIVE METABOLIC PANEL  ETHANOL  CBC WITH DIFFERENTIAL/PLATELET  AMMONIA    EKG  EKG Interpretation None       Radiology No results found.  Procedures Procedures (including critical care time)  Medications Ordered in ED Medications  sodium chloride 0.9 % bolus 500 mL (not administered)  0.9 %  sodium chloride infusion (not administered)  ketorolac (TORADOL) 30 MG/ML injection 30 mg (not administered)  ciprofloxacin (CIPRO) tablet 500 mg (not administered)     Initial Impression / Assessment and Plan / ED Course  I have reviewed the triage vital signs and the nursing notes.  Pertinent labs & imaging results that were available during my care of the patient were reviewed by me and considered in my medical decision making (see chart for details).      Patient Vitals for the past 24 hrs:  BP Temp Temp src Pulse Resp SpO2 Height Weight  03/21/17 1416 125/80 98 F (36.7 C) Oral 65 16 98 % 5\' 4"  (1.626 m) 99.8 kg (220 lb)    4:29 PM Reevaluation with update and discussion. After initial assessment and treatment, an updated evaluation reveals patient is eating without problems.  She is more alert at this time.  Family members are here with her.  Patient stated that she needed something more for pain, and her family member immediately told her that she had plenty of pain medicine and did not need anymore.  At that point the patient agreed with the family member.  Remainder of labs are pending at this time.  Patient was instructed that she can be treated with oral antibiotics, starting here and continued at home.  She understands and agrees with this treatment plan. Chrystie Hagwood L   Final Clinical Impressions(s) / ED Diagnoses   Final diagnoses:  Urinary tract infection without  hematuria, site unspecified  Chronic bilateral low back pain without  sciatica   Evaluation is consistent with chronic back pain, and acute urinary tract infection, without signs of serious bacterial illness or metabolic instability.  Nursing Notes Reviewed/ Care Coordinated Applicable Imaging Reviewed Interpretation of Laboratory Data incorporated into ED treatment  Plan: as per oncoming provider team.  New Prescriptions New Prescriptions   No medications on file     Daleen Bo, MD 03/21/17 1711

## 2017-08-01 ENCOUNTER — Encounter (HOSPITAL_COMMUNITY): Payer: Medicare Other

## 2017-08-01 ENCOUNTER — Other Ambulatory Visit (HOSPITAL_COMMUNITY): Payer: Self-pay | Admitting: Internal Medicine

## 2017-08-01 DIAGNOSIS — I739 Peripheral vascular disease, unspecified: Secondary | ICD-10-CM

## 2017-08-22 ENCOUNTER — Encounter (HOSPITAL_BASED_OUTPATIENT_CLINIC_OR_DEPARTMENT_OTHER): Payer: Medicare Other

## 2017-09-23 ENCOUNTER — Encounter (HOSPITAL_BASED_OUTPATIENT_CLINIC_OR_DEPARTMENT_OTHER): Payer: Medicare Other

## 2017-10-22 ENCOUNTER — Other Ambulatory Visit: Payer: Self-pay | Admitting: Internal Medicine

## 2017-10-22 DIAGNOSIS — E2839 Other primary ovarian failure: Secondary | ICD-10-CM

## 2017-12-09 ENCOUNTER — Other Ambulatory Visit: Payer: Self-pay | Admitting: Internal Medicine

## 2017-12-09 DIAGNOSIS — Z1231 Encounter for screening mammogram for malignant neoplasm of breast: Secondary | ICD-10-CM

## 2017-12-25 ENCOUNTER — Other Ambulatory Visit: Payer: Medicare Other

## 2018-01-15 ENCOUNTER — Other Ambulatory Visit: Payer: Medicare Other

## 2018-01-15 ENCOUNTER — Ambulatory Visit: Payer: Medicare Other

## 2018-02-24 ENCOUNTER — Ambulatory Visit: Payer: Medicare Other

## 2018-02-24 ENCOUNTER — Inpatient Hospital Stay: Admission: RE | Admit: 2018-02-24 | Payer: Medicare Other | Source: Ambulatory Visit

## 2018-03-29 ENCOUNTER — Other Ambulatory Visit: Payer: Self-pay | Admitting: Nurse Practitioner

## 2018-03-29 DIAGNOSIS — D8689 Sarcoidosis of other sites: Secondary | ICD-10-CM

## 2018-04-08 ENCOUNTER — Other Ambulatory Visit: Payer: Medicare Other

## 2018-04-08 ENCOUNTER — Ambulatory Visit: Payer: Medicare Other

## 2018-04-26 ENCOUNTER — Emergency Department (HOSPITAL_COMMUNITY)
Admission: EM | Admit: 2018-04-26 | Discharge: 2018-04-26 | Disposition: A | Discharge status: Home / Self Care | Payer: Medicare HMO | Attending: Emergency Medicine | Admitting: Emergency Medicine

## 2018-04-26 ENCOUNTER — Emergency Department (HOSPITAL_COMMUNITY): Payer: Medicare HMO

## 2018-04-26 ENCOUNTER — Emergency Department (HOSPITAL_BASED_OUTPATIENT_CLINIC_OR_DEPARTMENT_OTHER): Payer: Medicare HMO

## 2018-04-26 ENCOUNTER — Other Ambulatory Visit: Payer: Self-pay

## 2018-04-26 ENCOUNTER — Encounter (HOSPITAL_COMMUNITY): Payer: Self-pay

## 2018-04-26 DIAGNOSIS — S8991XA Unspecified injury of right lower leg, initial encounter: Secondary | ICD-10-CM | POA: Diagnosis present

## 2018-04-26 DIAGNOSIS — M7989 Other specified soft tissue disorders: Secondary | ICD-10-CM

## 2018-04-26 DIAGNOSIS — W19XXXA Unspecified fall, initial encounter: Secondary | ICD-10-CM | POA: Diagnosis not present

## 2018-04-26 DIAGNOSIS — F17210 Nicotine dependence, cigarettes, uncomplicated: Secondary | ICD-10-CM | POA: Insufficient documentation

## 2018-04-26 DIAGNOSIS — S82831A Other fracture of upper and lower end of right fibula, initial encounter for closed fracture: Secondary | ICD-10-CM | POA: Insufficient documentation

## 2018-04-26 DIAGNOSIS — G20 Parkinson's disease: Secondary | ICD-10-CM | POA: Diagnosis not present

## 2018-04-26 DIAGNOSIS — Y999 Unspecified external cause status: Secondary | ICD-10-CM | POA: Insufficient documentation

## 2018-04-26 DIAGNOSIS — Y929 Unspecified place or not applicable: Secondary | ICD-10-CM | POA: Insufficient documentation

## 2018-04-26 DIAGNOSIS — E119 Type 2 diabetes mellitus without complications: Secondary | ICD-10-CM | POA: Diagnosis not present

## 2018-04-26 DIAGNOSIS — M79609 Pain in unspecified limb: Secondary | ICD-10-CM

## 2018-04-26 DIAGNOSIS — R2241 Localized swelling, mass and lump, right lower limb: Secondary | ICD-10-CM | POA: Diagnosis not present

## 2018-04-26 DIAGNOSIS — I10 Essential (primary) hypertension: Secondary | ICD-10-CM | POA: Diagnosis not present

## 2018-04-26 DIAGNOSIS — Y939 Activity, unspecified: Secondary | ICD-10-CM | POA: Diagnosis not present

## 2018-04-26 HISTORY — DX: Parkinson's disease without dyskinesia, without mention of fluctuations: G20.A1

## 2018-04-26 HISTORY — DX: Parkinson's disease: G20

## 2018-04-26 LAB — COMPREHENSIVE METABOLIC PANEL
ALT: 29 U/L (ref 0–44)
AST: 41 U/L (ref 15–41)
Albumin: 3.7 g/dL (ref 3.5–5.0)
Alkaline Phosphatase: 133 U/L — ABNORMAL HIGH (ref 38–126)
Anion gap: 9 (ref 5–15)
BILIRUBIN TOTAL: 0.4 mg/dL (ref 0.3–1.2)
BUN: 15 mg/dL (ref 6–20)
CHLORIDE: 105 mmol/L (ref 98–111)
CO2: 29 mmol/L (ref 22–32)
Calcium: 9.5 mg/dL (ref 8.9–10.3)
Creatinine, Ser: 1.17 mg/dL — ABNORMAL HIGH (ref 0.44–1.00)
GFR, EST AFRICAN AMERICAN: 60 mL/min — AB (ref >60)
GFR, EST NON AFRICAN AMERICAN: 51 mL/min — AB (ref >60)
Glucose, Bld: 114 mg/dL — ABNORMAL HIGH (ref 70–99)
POTASSIUM: 3.9 mmol/L (ref 3.5–5.1)
Sodium: 143 mmol/L (ref 135–145)
TOTAL PROTEIN: 7.8 g/dL (ref 6.5–8.1)

## 2018-04-26 LAB — CBG MONITORING, ED: GLUCOSE-CAPILLARY: 75 mg/dL (ref 70–99)

## 2018-04-26 LAB — CBC
HCT: 32.3 % — ABNORMAL LOW (ref 36.0–46.0)
Hemoglobin: 10.8 g/dL — ABNORMAL LOW (ref 12.0–15.0)
MCH: 30.9 pg (ref 26.0–34.0)
MCHC: 33.4 g/dL (ref 30.0–36.0)
MCV: 92.6 fL (ref 78.0–100.0)
PLATELETS: 233 10*3/uL (ref 150–400)
RBC: 3.49 MIL/uL — ABNORMAL LOW (ref 3.87–5.11)
RDW: 15 % (ref 11.5–15.5)
WBC: 6.7 10*3/uL (ref 4.0–10.5)

## 2018-04-26 LAB — CK: CK TOTAL: 140 U/L (ref 38–234)

## 2018-04-26 MED ORDER — OXYCODONE HCL 5 MG PO TABS
5.0000 mg | ORAL_TABLET | Freq: Once | ORAL | Status: AC
  Administered 2018-04-26: 5 mg via ORAL
  Filled 2018-04-26: qty 1

## 2018-04-26 MED ORDER — OXYCODONE-ACETAMINOPHEN 5-325 MG PO TABS: 1.0000 | ORAL_TABLET | ORAL | 0 refills | Status: DC | PRN

## 2018-04-26 NOTE — Progress Notes (Signed)
VASCULAR LAB PRELIMINARY  PRELIMINARY  PRELIMINARY  PRELIMINARY  Right lower extremity venous duplex completed.    Preliminary report:  There is no DVT or SVT noted in the right lower extremity.    Tinaya Ceballos, RVT 04/26/2018, 1:51 PM

## 2018-04-26 NOTE — ED Notes (Signed)
Patient transported to X-ray 

## 2018-04-26 NOTE — Progress Notes (Signed)
Contacted [facility] for wheelchair for home. Jonnie Finner RN CCM Case Mgmt phone [phone]

## 2018-04-26 NOTE — Discharge Instructions (Addendum)
You were evaluated in the Emergency Department and after careful evaluation, we did not find any emergent condition requiring admission or further testing in the hospital.  Your symptoms today seem to be due to a fracture of the fibula.  Please keep the splint clean and dry and follow-up with orthopedic surgery within the next 3 to 5 days.  Please return to the Emergency Department if you experience any worsening of your condition.  We encourage you to follow up with a primary care provider.  Thank you for allowing [place] to be a part of your care.

## 2018-04-26 NOTE — ED Notes (Signed)
PT currently not in rm will complete ekg once return

## 2018-04-26 NOTE — Care Management Note (Signed)
Case Management Note  Patient Details  Name: Carol [name] MRN: [id number] Date of Birth: 05-15-1961  Subjective/Objective:        Falls, right foot fx            Action/Plan: NCM spoke to pt and offered choice for HH/list provided. Pt agreeable to [facility] for [facility]. Contacted [facility] with new referral. Contacted for crutches for home. Pt waiting on manual wheelchair for home from [facility] [address] to be delivered to room prior to dc.   Expected Discharge Date:                 Expected Discharge Plan:  [place]  In-House Referral:  NA  Discharge planning Services  CM Consult  Post Acute Care Choice:  Home Health Choice offered to:  Patient  DME Arranged:  Lightweight manual wheelchair with seat cushion DME Agency:  [place]:  PT, Nurse's Aide [place] Agency:  [facility]  Status of Service:  Completed, signed off  If discussed at [place] of Stay Meetings, dates discussed:    Additional Comments:  Erenest Rasher, RN 04/26/2018, 5:12 PM

## 2018-04-26 NOTE — Care Management (Cosign Needed)
..      Durable Medical Equipment  (From admission, onward)         Start     Ordered   04/26/18 1540  For home use only DME lightweight manual wheelchair with seat cushion  Once    Comments:  Patient suffers from fibula fracture which impairs their ability to perform daily activities like dressing in the home.  A walker will not resolve  issue with performing activities of daily living. A wheelchair will allow patient to safely perform daily activities. Patient is not able to propel themselves in the home using a standard weight wheelchair due to endurance. Patient can self propel in the lightweight wheelchair.  Accessories: elevating leg rests (ELRs), wheel locks, extensions and anti-tippers.   04/26/18 1541

## 2018-04-26 NOTE — ED Triage Notes (Signed)
Patient reports that she has been having multiple falls due to right leg "giving out." Patient's daughter reports that the patient has a history of Parkinson's. Patient also c/o right leg swelling.

## 2018-04-26 NOTE — ED Provider Notes (Signed)
[facility] Emergency Department Provider Note MRN:  [id number]  Arrival date & time: 04/26/18     Chief Complaint   multiple falls and Leg Swelling   History of Present Illness   Carol [name] is a 57 y.o. year-old female with a history of Parkinson's disease, diabetes presenting to the ED with chief complaint of leg pain, fall.  Patient has Parkinson's disease and has been falling frequently.  Had a fall to 3 days ago, was found on the ground by her daughter when she came to visit.  Since that endorsing right leg swelling, pain in the right ankle, right knee.  Denies head trauma, no neck pain, denies any chest pain or dizziness prior to falling, no shortness of breath, no abdominal pain.  Able to walk today but the pain is worse with walking.  Review of Systems  A complete 10 system review of systems was obtained and all systems are negative except as noted in the HPI and PMH.   Patient's Health History    Past Medical History:  Diagnosis Date  . Abnormal liver function tests   . Acute kidney injury ([place]) 01/2014   Hospitalized, Volume depletion, nausea and vomiting  . Anxiety   . 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 Chronic back pain   . Depression   . Diabetes mellitus   . GERD (gastroesophageal reflux disease)   . Heart murmur   . Hypertension   . Palpitations   . Parkinson's disease [facility])     Past Surgical History:  Procedure Laterality Date  . CARPAL TUNNEL RELEASE    . OPEN REDUCTION INTERNAL FIXATION (ORIF) FINGER WITH RADIAL BONE GRAFT Right 09/12/2015   Procedure: RIGHT MIDDLE FINGER CLOSED REDUCTION AND PINNING;  Surgeon: Iran Planas, MD;  Location: [facility];  Service: Orthopedics;  Laterality: Right;  . TONSILLECTOMY    . TUBAL LIGATION      Family History  Problem Relation Age of Onset  . Diabetes Mother   . Kidney disease Mother   . Seizures Father   . Hypertension Father   . Stroke Father   . Asthma Other       Social History   Socioeconomic History  . Marital status: Single    Spouse name: Not on file  . Number of children: 3  . Years of education: Not on file  . Highest education level: Not on file  Occupational History  . Not on file  Social Needs  . Financial resource strain: Not on file  . Food insecurity:    Worry: Not on file    Inability: Not on file  . Transportation needs:    Medical: Not on file    Non-medical: Not on file  Tobacco Use  . Smoking status: Current Some Day Smoker    Packs/day: 0.25    Years: 3.00    Pack years: 0.75    Types: Cigarettes  . Smokeless tobacco: Never Used  Substance and Sexual Activity  . Alcohol use: Yes    Alcohol/week: 0.0 standard drinks    Comment: occasional  . Drug use: No    Comment: none for 13 years  . Sexual activity: Not on file  Lifestyle  . Physical activity:    Days per week: Not on file    Minutes per session: Not on file  . Stress: Not on file  Relationships  . Social connections:    Talks on phone: Not on file    Gets together:  Not on file    Attends religious service: Not on file    Active member of club or organization: Not on file    Attends meetings of clubs or organizations: Not on file    Relationship status: Not on file  . Intimate partner violence:    Fear of current or ex partner: Not on file    Emotionally abused: Not on file    Physically abused: Not on file    Forced sexual activity: Not on file  Other Topics Concern  . Not on file  Social History Narrative  . Not on file     Physical Exam  Vital Signs and Nursing Notes reviewed Vitals:   04/26/18 1500 04/26/18 1533  BP: (!) 193/106 (!) 231/130  Pulse: 71 67  Resp: 17 18  Temp:    SpO2: 98% 98%    CONSTITUTIONAL: Well-appearing, NAD NEURO:  Alert and oriented x 3, no focal deficits EYES:  eyes equal and reactive ENT/NECK:  no LAD, no JVD CARDIO: Regular rate, well-perfused, normal S1 and S2 PULM:  CTAB no wheezing or rhonchi GI/GU:   normal bowel sounds, non-distended, non-tender MSK/SPINE:  No gross deformities, asymmetric 2+ pitting edema to the right leg, tender to palpation to the right ankle, right knee, mild decreased range of motion due to pain. SKIN:  no rash, atraumatic PSYCH:  Appropriate speech and behavior  Diagnostic and Interventional Summary    EKG Interpretation  Date/Time:    Ventricular Rate:    PR Interval:    QRS Duration:   QT Interval:    QTC Calculation:   R Axis:     Text Interpretation:        Labs Reviewed  CBC - Abnormal; Notable for the following components:      Result Value   RBC 3.49 (*)    Hemoglobin 10.8 (*)    HCT 32.3 (*)    All other components within normal limits  COMPREHENSIVE METABOLIC PANEL - Abnormal; Notable for the following components:   Glucose, Bld 114 (*)    Creatinine, Ser 1.17 (*)    Alkaline Phosphatase 133 (*)    GFR calc non Af Amer 51 (*)    GFR calc Af Amer 60 (*)    All other components within normal limits  CK  CBG MONITORING, ED    DG Tibia/Fibula Right  Final Result    DG Knee Complete 4 Views Right  Final Result    DG Ankle 2 Views Right  Final Result      Medications  oxyCODONE (Oxy IR/ROXICODONE) immediate release tablet 5 mg (5 mg Oral Given 04/26/18 1136)  oxyCODONE (Oxy IR/ROXICODONE) immediate release tablet 5 mg (5 mg Oral Given 04/26/18 1356)     Procedures Critical Care  ED Course and Medical Decision Making  I have reviewed the triage vital signs and the nursing notes.  Pertinent labs & imaging results that were available during my care of the patient were reviewed by me and considered in my medical decision making (see below for details).    Question of traumatic injuries versus DVT in this 57 year old female with asymmetric leg swelling.  X-rays, ultrasound, labs pending.  Imaging reveals acute spiral fibular fracture, discussed with orthopedics, will place splint here and have patient follow-up closely in the next  few days in the Ortho clinic.  Patient provided with case management consultation, wheelchair to ensure she is not having repeated falls at home given the new injury.  After the  discussed management above, the patient was determined to be safe for discharge.  The patient was in agreement with this plan and all questions regarding their care were answered.  ED return precautions were discussed and the patient will return to the ED with any significant worsening of condition.  Barth Kirks. Sedonia Small, MD [place] [email or web address]  Final Clinical Impressions(s) / ED Diagnoses     ICD-10-CM   1. Closed fracture of distal end of right fibula, unspecified fracture morphology, initial encounter Y11.173V     ED Discharge Orders    None         Maudie Flakes, MD 04/26/18 1547

## 2018-05-27 ENCOUNTER — Other Ambulatory Visit: Payer: Self-pay | Admitting: Orthopedic Surgery

## 2018-05-27 DIAGNOSIS — M79651 Pain in right thigh: Secondary | ICD-10-CM

## 2018-05-29 ENCOUNTER — Ambulatory Visit: Payer: Medicare Other

## 2018-05-29 ENCOUNTER — Inpatient Hospital Stay: Admission: RE | Admit: 2018-05-29 | Payer: Medicare Other | Source: Ambulatory Visit

## 2018-06-03 ENCOUNTER — Ambulatory Visit
Admission: RE | Admit: 2018-06-03 | Discharge: 2018-06-03 | Disposition: A | Discharge status: Home / Self Care | Payer: Medicare Other | Source: Ambulatory Visit | Attending: Orthopedic Surgery | Admitting: Orthopedic Surgery

## 2018-06-03 DIAGNOSIS — M79651 Pain in right thigh: Secondary | ICD-10-CM

## 2018-06-03 MED ORDER — IOPAMIDOL (ISOVUE-300) INJECTION 61%
100.0000 mL | Freq: Once | INTRAVENOUS | Status: AC | PRN
  Administered 2018-06-03: 100 mL via INTRAVENOUS

## 2018-07-21 ENCOUNTER — Ambulatory Visit: Payer: Medicare Other

## 2018-08-29 ENCOUNTER — Ambulatory Visit: Payer: Medicare Other

## 2018-09-26 ENCOUNTER — Ambulatory Visit
Admission: RE | Admit: 2018-09-26 | Discharge: 2018-09-26 | Disposition: A | Discharge status: Home / Self Care | Payer: Medicare HMO | Source: Ambulatory Visit | Attending: Internal Medicine | Admitting: Internal Medicine

## 2018-09-26 DIAGNOSIS — Z1231 Encounter for screening mammogram for malignant neoplasm of breast: Secondary | ICD-10-CM

## 2018-10-06 ENCOUNTER — Other Ambulatory Visit: Payer: Self-pay | Admitting: Oral Surgery

## 2018-10-13 ENCOUNTER — Other Ambulatory Visit: Payer: Self-pay | Admitting: Nurse Practitioner

## 2018-10-13 DIAGNOSIS — D869 Sarcoidosis, unspecified: Secondary | ICD-10-CM

## 2018-10-21 ENCOUNTER — Other Ambulatory Visit: Payer: Medicare HMO

## 2019-04-01 ENCOUNTER — Ambulatory Visit
Admission: RE | Admit: 2019-04-01 | Discharge: 2019-04-01 | Disposition: A | Discharge status: Home / Self Care | Payer: Medicare HMO | Source: Ambulatory Visit | Attending: Nurse Practitioner | Admitting: Nurse Practitioner

## 2019-04-01 DIAGNOSIS — D869 Sarcoidosis, unspecified: Secondary | ICD-10-CM

## 2019-05-08 ENCOUNTER — Other Ambulatory Visit: Payer: Self-pay | Admitting: Nurse Practitioner

## 2019-05-08 DIAGNOSIS — Z1231 Encounter for screening mammogram for malignant neoplasm of breast: Secondary | ICD-10-CM

## 2019-05-18 ENCOUNTER — Encounter (HOSPITAL_COMMUNITY): Payer: Self-pay

## 2019-05-18 ENCOUNTER — Other Ambulatory Visit: Payer: Self-pay

## 2019-05-18 ENCOUNTER — Emergency Department (HOSPITAL_COMMUNITY)
Admission: EM | Admit: 2019-05-18 | Discharge: 2019-05-18 | Disposition: A | Discharge status: Home / Self Care | Payer: Medicare Other | Attending: Emergency Medicine | Admitting: Emergency Medicine

## 2019-05-18 ENCOUNTER — Emergency Department (HOSPITAL_COMMUNITY): Payer: Medicare Other

## 2019-05-18 DIAGNOSIS — Y999 Unspecified external cause status: Secondary | ICD-10-CM | POA: Diagnosis not present

## 2019-05-18 DIAGNOSIS — Z794 Long term (current) use of insulin: Secondary | ICD-10-CM | POA: Insufficient documentation

## 2019-05-18 DIAGNOSIS — Y92811 Bus as the place of occurrence of the external cause: Secondary | ICD-10-CM | POA: Insufficient documentation

## 2019-05-18 DIAGNOSIS — S80911A Unspecified superficial injury of right knee, initial encounter: Secondary | ICD-10-CM | POA: Diagnosis present

## 2019-05-18 DIAGNOSIS — Z79899 Other long term (current) drug therapy: Secondary | ICD-10-CM | POA: Diagnosis not present

## 2019-05-18 DIAGNOSIS — S8002XA Contusion of left knee, initial encounter: Secondary | ICD-10-CM | POA: Diagnosis not present

## 2019-05-18 DIAGNOSIS — S8001XA Contusion of right knee, initial encounter: Secondary | ICD-10-CM | POA: Diagnosis not present

## 2019-05-18 DIAGNOSIS — W01198A Fall on same level from slipping, tripping and stumbling with subsequent striking against other object, initial encounter: Secondary | ICD-10-CM | POA: Insufficient documentation

## 2019-05-18 DIAGNOSIS — I10 Essential (primary) hypertension: Secondary | ICD-10-CM | POA: Insufficient documentation

## 2019-05-18 DIAGNOSIS — W19XXXA Unspecified fall, initial encounter: Secondary | ICD-10-CM

## 2019-05-18 DIAGNOSIS — Y93I9 Activity, other involving external motion: Secondary | ICD-10-CM | POA: Insufficient documentation

## 2019-05-18 DIAGNOSIS — E119 Type 2 diabetes mellitus without complications: Secondary | ICD-10-CM | POA: Diagnosis not present

## 2019-05-18 DIAGNOSIS — F17210 Nicotine dependence, cigarettes, uncomplicated: Secondary | ICD-10-CM | POA: Diagnosis not present

## 2019-05-18 DIAGNOSIS — S8000XA Contusion of unspecified knee, initial encounter: Secondary | ICD-10-CM

## 2019-05-18 MED ORDER — IBUPROFEN 600 MG PO TABS: 600.0000 mg | ORAL_TABLET | Freq: Four times a day (QID) | ORAL | 0 refills | Status: DC | PRN

## 2019-05-18 MED ORDER — CYCLOBENZAPRINE HCL 10 MG PO TABS: 10.0000 mg | ORAL_TABLET | Freq: Three times a day (TID) | ORAL | 0 refills | Status: DC | PRN

## 2019-05-18 MED ORDER — KETOROLAC TROMETHAMINE 30 MG/ML IJ SOLN
30.0000 mg | Freq: Once | INTRAMUSCULAR | Status: AC
  Administered 2019-05-18: 30 mg via INTRAMUSCULAR
  Filled 2019-05-18: qty 1
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
## 2019-05-18 NOTE — ED Notes (Signed)
Pt d/c home per MD order,. Discharge summary reviewed with pt, pt verbalizes understanding. Reports discharge ride home. Ambulatory off unit.
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
## 2019-09-05 ENCOUNTER — Encounter (HOSPITAL_COMMUNITY): Payer: Self-pay | Admitting: Emergency Medicine

## 2019-09-05 ENCOUNTER — Other Ambulatory Visit: Payer: Self-pay

## 2019-09-05 DIAGNOSIS — Z5321 Procedure and treatment not carried out due to patient leaving prior to being seen by health care provider: Secondary | ICD-10-CM | POA: Insufficient documentation

## 2019-09-05 DIAGNOSIS — R42 Dizziness and giddiness: Secondary | ICD-10-CM | POA: Diagnosis not present

## 2019-09-05 LAB — I-STAT BETA HCG BLOOD, ED (MC, WL, AP ONLY): I-stat hCG, quantitative: <5 m[IU]/mL (ref <5)

## 2019-09-05 MED ORDER — SODIUM CHLORIDE 0.9% FLUSH: 3.0000 mL | Freq: Once | INTRAVENOUS | Status: DC

## 2019-09-05 NOTE — ED Triage Notes (Addendum)
Patient brought in by [facility] from home. Patient complaining of dizziness. EMS states that patient had orthostatic changes. Patient sitting- 120/80, standing 88/50, and laying 135/98.

## 2019-09-06 ENCOUNTER — Emergency Department (HOSPITAL_COMMUNITY)
Admission: EM | Admit: 2019-09-06 | Discharge: 2019-09-06 | Disposition: A | Discharge status: Home / Self Care | Payer: Medicare Other | Attending: Emergency Medicine | Admitting: Emergency Medicine

## 2019-09-06 LAB — BASIC METABOLIC PANEL WITH GFR
Anion gap: 11 (ref 5–15)
BUN: 10 mg/dL (ref 6–20)
CO2: 20 mmol/L — ABNORMAL LOW (ref 22–32)
Calcium: 8.5 mg/dL — ABNORMAL LOW (ref 8.9–10.3)
Chloride: 105 mmol/L (ref 98–111)
Creatinine, Ser: 1.23 mg/dL — ABNORMAL HIGH (ref 0.44–1.00)
GFR calc Af Amer: 57 mL/min — ABNORMAL LOW
GFR calc non Af Amer: 49 mL/min — ABNORMAL LOW
Glucose, Bld: 205 mg/dL — ABNORMAL HIGH (ref 70–99)
Potassium: 3.2 mmol/L — ABNORMAL LOW (ref 3.5–5.1)
Sodium: 136 mmol/L (ref 135–145)

## 2019-09-06 LAB — CBC
HCT: 40.6 % (ref 36.0–46.0)
Hemoglobin: 13.6 g/dL (ref 12.0–15.0)
MCH: 30 pg (ref 26.0–34.0)
MCHC: 33.5 g/dL (ref 30.0–36.0)
MCV: 89.6 fL (ref 80.0–100.0)
Platelets: 190 K/uL (ref 150–400)
RBC: 4.53 MIL/uL (ref 3.87–5.11)
RDW: 15.2 % (ref 11.5–15.5)
WBC: 4.7 K/uL (ref 4.0–10.5)
nRBC: 0 % (ref 0.0–0.2)

## 2020-01-15 ENCOUNTER — Ambulatory Visit
Admission: RE | Admit: 2020-01-15 | Discharge: 2020-01-15 | Disposition: A | Discharge status: Home / Self Care | Payer: Medicare Other | Source: Ambulatory Visit | Attending: Orthopedic Surgery | Admitting: Orthopedic Surgery

## 2020-01-15 ENCOUNTER — Other Ambulatory Visit: Payer: Self-pay | Admitting: Orthopedic Surgery

## 2020-01-15 ENCOUNTER — Other Ambulatory Visit: Payer: Self-pay

## 2020-01-15 DIAGNOSIS — R0781 Pleurodynia: Secondary | ICD-10-CM

## 2020-03-08 ENCOUNTER — Other Ambulatory Visit: Payer: Self-pay | Admitting: Physician Assistant

## 2020-03-31 ENCOUNTER — Ambulatory Visit: Payer: Medicare Other

## 2020-11-21 ENCOUNTER — Other Ambulatory Visit: Payer: Self-pay | Admitting: Internal Medicine

## 2020-11-22 LAB — COMPLETE METABOLIC PANEL WITH GFR
AG Ratio: 1.1 (calc) (ref 1.0–2.5)
ALT: 8 U/L (ref 6–29)
AST: 17 U/L (ref 10–35)
Albumin: 3.6 g/dL (ref 3.6–5.1)
Alkaline phosphatase (APISO): 116 U/L (ref 37–153)
BUN/Creatinine Ratio: 12 (calc) (ref 6–22)
BUN: 16 mg/dL (ref 7–25)
CO2: 17 mmol/L — ABNORMAL LOW (ref 20–32)
Calcium: 8.9 mg/dL (ref 8.6–10.4)
Chloride: 102 mmol/L (ref 98–110)
Creat: 1.36 mg/dL — ABNORMAL HIGH (ref 0.50–1.05)
GFR, Est African American: 50 mL/min/{1.73_m2} — ABNORMAL LOW (ref >=60)
GFR, Est Non African American: 43 mL/min/{1.73_m2} — ABNORMAL LOW (ref >=60)
Globulin: 3.3 g/dL (calc) (ref 1.9–3.7)
Glucose, Bld: 269 mg/dL — ABNORMAL HIGH (ref 65–99)
Potassium: 4 mmol/L (ref 3.5–5.3)
Sodium: 135 mmol/L (ref 135–146)
Total Bilirubin: 0.3 mg/dL (ref 0.2–1.2)
Total Protein: 6.9 g/dL (ref 6.1–8.1)

## 2020-11-22 LAB — CBC
HCT: 35.8 % (ref 35.0–45.0)
Hemoglobin: 11.7 g/dL (ref 11.7–15.5)
MCH: 29.6 pg (ref 27.0–33.0)
MCHC: 32.7 g/dL (ref 32.0–36.0)
MCV: 90.6 fL (ref 80.0–100.0)
MPV: 12.9 fL — ABNORMAL HIGH (ref 7.5–12.5)
Platelets: 241 10*3/uL (ref 140–400)
RBC: 3.95 10*6/uL (ref 3.80–5.10)
RDW: 13.4 % (ref 11.0–15.0)
WBC: 6.6 10*3/uL (ref 3.8–10.8)

## 2020-11-22 LAB — TSH: TSH: 1.09 mIU/L (ref 0.40–4.50)

## 2020-11-22 LAB — LIPID PANEL
Cholesterol: 249 mg/dL — ABNORMAL HIGH (ref <200)
HDL: 56 mg/dL (ref >=50)
Non-HDL Cholesterol (Calc): 193 mg/dL (calc) — ABNORMAL HIGH (ref <130)
Total CHOL/HDL Ratio: 4.4 (calc) (ref <5.0)
Triglycerides: 413 mg/dL — ABNORMAL HIGH (ref <150)

## 2020-11-22 LAB — VITAMIN D 25 HYDROXY (VIT D DEFICIENCY, FRACTURES): Vit D, 25-Hydroxy: 26 ng/mL — ABNORMAL LOW (ref 30–100)

## 2020-11-26 ENCOUNTER — Other Ambulatory Visit: Payer: Self-pay | Admitting: Internal Medicine

## 2020-11-26 DIAGNOSIS — E2839 Other primary ovarian failure: Secondary | ICD-10-CM

## 2021-05-05 ENCOUNTER — Other Ambulatory Visit: Payer: Self-pay | Admitting: Nurse Practitioner

## 2021-05-05 DIAGNOSIS — Z780 Asymptomatic menopausal state: Secondary | ICD-10-CM

## 2021-05-12 ENCOUNTER — Other Ambulatory Visit: Payer: Self-pay | Admitting: Nurse Practitioner

## 2021-05-12 DIAGNOSIS — Z1231 Encounter for screening mammogram for malignant neoplasm of breast: Secondary | ICD-10-CM

## 2021-11-26 ENCOUNTER — Emergency Department (HOSPITAL_COMMUNITY)
Admission: EM | Admit: 2021-11-26 | Discharge: 2021-11-26 | Disposition: A | Discharge status: Home / Self Care | Payer: Medicare Other | Attending: Emergency Medicine | Admitting: Emergency Medicine

## 2021-11-26 ENCOUNTER — Other Ambulatory Visit: Payer: Self-pay

## 2021-11-26 ENCOUNTER — Encounter (HOSPITAL_COMMUNITY): Payer: Self-pay

## 2021-11-26 ENCOUNTER — Other Ambulatory Visit: Payer: Self-pay | Admitting: Physician Assistant

## 2021-11-26 ENCOUNTER — Emergency Department (HOSPITAL_COMMUNITY): Payer: Medicare Other

## 2021-11-26 DIAGNOSIS — I161 Hypertensive emergency: Secondary | ICD-10-CM | POA: Diagnosis not present

## 2021-11-26 DIAGNOSIS — Z7984 Long term (current) use of oral hypoglycemic drugs: Secondary | ICD-10-CM | POA: Diagnosis not present

## 2021-11-26 DIAGNOSIS — I10 Essential (primary) hypertension: Secondary | ICD-10-CM | POA: Diagnosis not present

## 2021-11-26 DIAGNOSIS — N179 Acute kidney failure, unspecified: Secondary | ICD-10-CM

## 2021-11-26 DIAGNOSIS — E119 Type 2 diabetes mellitus without complications: Secondary | ICD-10-CM | POA: Insufficient documentation

## 2021-11-26 DIAGNOSIS — R079 Chest pain, unspecified: Secondary | ICD-10-CM | POA: Diagnosis not present

## 2021-11-26 DIAGNOSIS — R778 Other specified abnormalities of plasma proteins: Secondary | ICD-10-CM | POA: Insufficient documentation

## 2021-11-26 DIAGNOSIS — F17200 Nicotine dependence, unspecified, uncomplicated: Secondary | ICD-10-CM | POA: Diagnosis not present

## 2021-11-26 DIAGNOSIS — R0789 Other chest pain: Secondary | ICD-10-CM | POA: Diagnosis present

## 2021-11-26 DIAGNOSIS — N189 Chronic kidney disease, unspecified: Secondary | ICD-10-CM | POA: Diagnosis not present

## 2021-11-26 DIAGNOSIS — Z794 Long term (current) use of insulin: Secondary | ICD-10-CM | POA: Insufficient documentation

## 2021-11-26 DIAGNOSIS — I160 Hypertensive urgency: Secondary | ICD-10-CM

## 2021-11-26 DIAGNOSIS — R002 Palpitations: Secondary | ICD-10-CM | POA: Insufficient documentation

## 2021-11-26 DIAGNOSIS — Z79899 Other long term (current) drug therapy: Secondary | ICD-10-CM | POA: Diagnosis not present

## 2021-11-26 DIAGNOSIS — R072 Precordial pain: Secondary | ICD-10-CM

## 2021-11-26 LAB — I-STAT BETA HCG BLOOD, ED (MC, WL, AP ONLY): I-stat hCG, quantitative: <5 m[IU]/mL (ref <5)

## 2021-11-26 LAB — BASIC METABOLIC PANEL
Anion gap: 12 (ref 5–15)
BUN: 10 mg/dL (ref 6–20)
CO2: 19 mmol/L — ABNORMAL LOW (ref 22–32)
Calcium: 8.8 mg/dL — ABNORMAL LOW (ref 8.9–10.3)
Chloride: 107 mmol/L (ref 98–111)
Creatinine, Ser: 1.69 mg/dL — ABNORMAL HIGH (ref 0.44–1.00)
GFR, Estimated: 35 mL/min — ABNORMAL LOW (ref >60)
Glucose, Bld: 122 mg/dL — ABNORMAL HIGH (ref 70–99)
Potassium: 3.4 mmol/L — ABNORMAL LOW (ref 3.5–5.1)
Sodium: 138 mmol/L (ref 135–145)

## 2021-11-26 LAB — CBC
HCT: 38 % (ref 36.0–46.0)
Hemoglobin: 12.6 g/dL (ref 12.0–15.0)
MCH: 29.5 pg (ref 26.0–34.0)
MCHC: 33.2 g/dL (ref 30.0–36.0)
MCV: 89 fL (ref 80.0–100.0)
Platelets: 214 10*3/uL (ref 150–400)
RBC: 4.27 MIL/uL (ref 3.87–5.11)
RDW: 13.4 % (ref 11.5–15.5)
WBC: 5.5 10*3/uL (ref 4.0–10.5)
nRBC: 0 % (ref 0.0–0.2)

## 2021-11-26 LAB — TROPONIN I (HIGH SENSITIVITY)
Troponin I (High Sensitivity): 44 ng/L — ABNORMAL HIGH (ref <18)
Troponin I (High Sensitivity): 48 ng/L — ABNORMAL HIGH (ref <18)

## 2021-11-26 LAB — CBG MONITORING, ED: Glucose-Capillary: 110 mg/dL — ABNORMAL HIGH (ref 70–99)

## 2021-11-26 MED ORDER — NITROGLYCERIN 2 % TD OINT
1.0000 [in_us] | TOPICAL_OINTMENT | Freq: Once | TRANSDERMAL | Status: AC
  Administered 2021-11-26: 1 [in_us] via TOPICAL
  Filled 2021-11-26: qty 1

## 2021-11-26 MED ORDER — AMLODIPINE BESYLATE 5 MG PO TABS: 10.0000 mg | ORAL_TABLET | Freq: Every day | ORAL | Status: DC

## 2021-11-26 MED ORDER — AMLODIPINE BESYLATE 10 MG PO TABS: 10.0000 mg | ORAL_TABLET | Freq: Every day | ORAL | 0 refills | Status: DC

## 2021-11-26 MED ORDER — HYDRALAZINE HCL 50 MG PO TABS: 50.0000 mg | ORAL_TABLET | Freq: Three times a day (TID) | ORAL | 2 refills | Status: DC

## 2021-11-26 MED ORDER — AMLODIPINE BESYLATE 5 MG PO TABS
10.0000 mg | ORAL_TABLET | Freq: Once | ORAL | Status: AC
  Administered 2021-11-26: 10 mg via ORAL
  Filled 2021-11-26: qty 2

## 2021-11-26 MED ORDER — ALPRAZOLAM 0.25 MG PO TABS
0.5000 mg | ORAL_TABLET | Freq: Once | ORAL | Status: AC
  Administered 2021-11-26: 0.5 mg via ORAL
  Filled 2021-11-26: qty 2

## 2021-11-26 MED ORDER — ASPIRIN 81 MG PO CHEW
324.0000 mg | CHEWABLE_TABLET | Freq: Once | ORAL | Status: AC
  Administered 2021-11-26: 324 mg via ORAL
  Filled 2021-11-26: qty 4

## 2021-11-26 NOTE — ED Provider Notes (Addendum)
?[place] ?Provider Note ? ? ?CSN: [id number] ?Arrival date & time: 11/26/21  1104 ? ?  ? ?History ? ?Chief Complaint  ?Patient presents with  ? Chest Pain  ? ? ?Carol [name] is a 61 y.o. female. ? ?HPI ? ?HPI: A 61 year old patient with a history of treated diabetes, hypertension and hypercholesterolemia presents for evaluation of chest pain. Initial onset of pain was approximately 1-3 hours ago. The patient's chest pain is described as heaviness/pressure/tightness and is not worse with exertion. The patient complains of nausea and reports some diaphoresis. The patient's chest pain is middle- or left-sided, is not well-localized, is not sharp and does not radiate to the arms/jaw/neck. The patient has smoked in the past 90 days. The patient has no history of stroke, has no history of peripheral artery disease, has no relevant family history of coronary artery disease (first degree relative at less than age 50) and does not have an elevated BMI (>=30).  ? ?Patient started having chest pressure about an hour ago.  Chest pressure is described as pressure-like sensation on the left side without any radiation. ? ?Patient also indicates that the last several years she has had episodes of palpitations.  With the palpitations she gets dizzy and on occasion has a near fainting sensation.  ? ?No stress test in the past. ? ?Home Medications ?Prior to Admission medications   ?Medication Sig Start Date End Date Taking? Authorizing Provider  ?albuterol (PROVENTIL HFA;VENTOLIN HFA) 108 (90 Base) MCG/ACT inhaler Inhale 2 puffs into the lungs every 6 (six) hours as needed for wheezing or shortness of breath.   Yes [provider]  ?ALPRAZolam Duanne Moron) 1 MG tablet Take 1 mg by mouth 2 (two) times daily as needed for anxiety.  09/02/15  Yes [provider]  ?budesonide-formoterol (SYMBICORT) 160-4.5 MCG/ACT inhaler Inhale 2 puffs into the lungs 2 (two) times daily as  needed. 11/09/21  Yes [provider]  ?Buprenorphine HCl-Naloxone HCl 4-1 MG FILM Place 0.5 strips under the tongue daily as needed (narcotic dependance). 08/01/21  Yes [provider]  ?cetirizine (ZYRTEC) 10 MG tablet Take 10 mg by mouth daily as needed for allergies.   Yes [provider]  ?cyclobenzaprine (FLEXERIL) 10 MG tablet Take 1 tablet (10 mg total) by mouth 3 (three) times daily as needed for muscle spasms. 05/18/19  Yes Domenic Moras, PA-C  ?diphenhydramine-acetaminophen (TYLENOL PM) 25-500 MG TABS Take 2 tablets by mouth at bedtime as needed (For sleeping disorder.).    Yes [provider]  ?ergocalciferol (VITAMIN D2) 1.25 MG (50000 UT) capsule Take 50,000 Units by mouth once a week. Tuesdays   Yes [provider]  ?gabapentin (NEURONTIN) 100 MG capsule Take 1 capsule (100 mg total) by mouth at bedtime. ?Patient taking differently: Take 100 mg by mouth 3 (three) times daily. 02/02/14  Yes Regalado, Belkys A, MD  ?glimepiride (AMARYL) 4 MG tablet Take 4 mg by mouth 2 (two) times daily.   Yes [provider]  ?hydrOXYzine (ATARAX) 50 MG tablet Take 50 mg by mouth 3 (three) times daily as needed for anxiety or itching.   Yes [provider]  ?ibuprofen (ADVIL) 600 MG tablet Take 1 tablet (600 mg total) by mouth every 6 (six) hours as needed. ?Patient taking differently: Take 600 mg by mouth every 6 (six) hours as needed for fever or headache. 05/18/19  Yes Domenic Moras, PA-C  ?insulin aspart (NOVOLOG) 100 UNIT/ML FlexPen Inject 16 Units into the  skin 2 (two) times daily.   Yes [provider]  ?insulin glargine (LANTUS) 100 UNIT/ML injection Inject 0.25 mLs (25 Units total) into the skin daily. ?Patient taking differently: Inject 60 Units into the skin daily. 02/02/14  Yes Regalado, Belkys A, MD  ?insulin lispro (HUMALOG) 100 UNIT/ML KwikPen Inject 16 Units into the skin 2 (two) times daily.   Yes [provider]  ?metFORMIN  (GLUCOPHAGE) 500 MG tablet Take 500 mg by mouth daily before breakfast.    Yes [provider]  ?nebivolol (BYSTOLIC) 10 MG tablet Take 1 tablet (10 mg total) by mouth daily. 05/16/15  Yes Antonietta Breach, PA-C  ?olmesartan-hydrochlorothiazide (BENICAR HCT) 40-25 MG tablet Take 1 tablet by mouth daily. 11/09/21  Yes [provider]  ?oxyCODONE-acetaminophen (PERCOCET/ROXICET) 5-325 MG tablet Take 1-2 tablets by mouth every 6 (six) hours as needed for severe pain. 08/31/15  Yes Cartner, Marland Kitchen, PA-C  ?pantoprazole (PROTONIX) 40 MG tablet Take 1 tablet (40 mg total) by mouth daily at 12 noon. ?Patient taking differently: Take 40 mg by mouth daily as needed (acid reflux). 02/02/14  Yes Regalado, Belkys A, MD  ?pravastatin (PRAVACHOL) 40 MG tablet Take 40 mg by mouth daily.   Yes [provider]  ?QUEtiapine (SEROQUEL) 300 MG tablet Take 300 mg by mouth at bedtime.   Yes [provider]  ?sertraline (ZOLOFT) 50 MG tablet Take 50 mg by mouth daily.   Yes [provider]  ?triamcinolone ointment (KENALOG) 0.1 % Apply 1 application topically 2 (two) times daily as needed (Apply to feet after showering for dry skin.).    Yes [provider]  ?amLODipine (NORVASC) 10 MG tablet Take 1 tablet (10 mg total) by mouth daily. 11/26/21   Varney Biles, MD  ?dicyclomine (BENTYL) 20 MG tablet Take 1 tablet (20 mg total) by mouth every 6 (six) hours. ?Patient not taking: Reported on 11/26/2021 07/30/14   Gatha Mayer, MD  ?docusate sodium (COLACE) 100 MG capsule Take 1 capsule (100 mg total) by mouth 2 (two) times daily. ?Patient not taking: Reported on 11/26/2021 09/12/15   Iran Planas, MD  ?ondansetron [facility]) 4 MG/2ML SOLN injection Inject 2 mLs (4 mg total) into the vein every 6 (six) hours as needed for nausea or vomiting. ?Patient not taking: Reported on 11/26/2021 02/02/14   Niel Hummer A, MD  ?oxyCODONE-acetaminophen (PERCOCET/ROXICET) 5-325 MG tablet Take 1 tablet by mouth  every 4 (four) hours as needed for severe pain. ?Patient not taking: Reported on 11/26/2021 04/26/18   Maudie Flakes, MD  ?oxyCODONE-acetaminophen (ROXICET) 5-325 MG tablet Take 1 tablet by mouth every 4 (four) hours as needed for severe pain. ?Patient not taking: Reported on 11/26/2021 09/12/15   Iran Planas, MD  ?   ? ?Allergies    ?Penicillins and Tramadol   ? ?Review of Systems   ?Review of Systems  ?All other systems reviewed and are negative. ? ?Physical Exam ?Updated Vital Signs ?BP (!) 188/100   Pulse 98   Temp 98.1 ?F (36.7 ?C) (Oral)   Resp 14   Ht 5\' 4"  (1.626 m)   Wt 77.1 kg   SpO2 94%   BMI 29.18 kg/m?  ?Physical Exam ?Vitals and nursing note reviewed.  ?Constitutional:   ?   Appearance: She is well-developed.  ?HENT:  ?   Head: Atraumatic.  ?Cardiovascular:  ?   Rate and Rhythm: Normal rate.  ?Pulmonary:  ?   Effort: Pulmonary effort is normal.  ?Musculoskeletal:  ?  Cervical back: Normal range of motion and neck supple.  ?   Right lower leg: No edema.  ?   Left lower leg: No edema.  ?Skin: ?   General: Skin is warm and dry.  ?Neurological:  ?   Mental Status: She is alert and oriented to person, place, and time.  ? ? ?ED Results / Procedures / Treatments   ?Labs ?(all labs ordered are listed, but only abnormal results are displayed) ?Labs Reviewed  ?BASIC METABOLIC PANEL - Abnormal; Notable for the following components:  ?    Result Value  ? Potassium 3.4 (*)   ? CO2 19 (*)   ? Glucose, Bld 122 (*)   ? Creatinine, Ser 1.69 (*)   ? Calcium 8.8 (*)   ? GFR, Estimated 35 (*)   ? All other components within normal limits  ?CBG MONITORING, ED - Abnormal; Notable for the following components:  ? Glucose-Capillary 110 (*)   ? All other components within normal limits  ?TROPONIN I (HIGH SENSITIVITY) - Abnormal; Notable for the following components:  ? Troponin I (High Sensitivity) 44 (*)   ? All other components within normal limits  ?TROPONIN I (HIGH SENSITIVITY) - Abnormal; Notable for the following  components:  ? Troponin I (High Sensitivity) 48 (*)   ? All other components within normal limits  ?CBC  ?I-STAT BETA HCG BLOOD, ED ([facility], [facility], [facility] ONLY)  ? ? ?EKG ?EKG Interpretation ? ?Date/Time:  Sunday November 26 2021 11

## 2021-11-26 NOTE — Consult Note (Addendum)
?Cardiology Consultation:  ? ?Carol ID: [name] ?MRN: [id number]; DOB: [birth year] ? ?Admit date: 11/26/2021 ?Date of Consult: 11/26/2021 ? ?PCP:  Simona Huh, NP ?  ?[facility] Providers ?Cardiologist:  New to [facility] - Dr. Quentin Cornwall   ? ? ?Carol Profile:  ? ?Carol [name] is a 61 y.o. female with a hx of hypertension, hyperlipidemia, DM2, and remote cocaine use (last positive in June 2015) who is being seen 11/26/2021 for the evaluation of chest discomfort and mildly elevated troponin at the request of Dr. Kathrynn Humble. ? ?History of Present Illness:  ? ?Carol [name] is a 61 year old female with past medical history of hypertension, hyperlipidemia, DM2, and remote cocaine use (last positive in June 2015).  Carol has never had any previous ischemic work-up and does not see a nephrologist.  Echocardiogram obtained in June 2015 showed EF 65%, moderate LVH, normal wall motion.  She does not have any family history of early CAD, however she has quite significant family history of high blood pressure and diabetes.  She has had 2 blood pressure medication listed on her medication list, however she does not recognize either one.  She says her usual blood pressure has been elevated in the 180-200 range at home.  Per Carol, " it does not matter what I do. My blood pressure will be high."  She has a history of intermittent skipped heartbeat in the past.  This is fairly infrequent.  Carol was in her usual state of health until she woke up in the morning of 11/26/2021 with pressure under the left breast.  Symptom was accompanied by shortness of breath and dizziness.  This symptom lasted about 1-1/2 to 2 hours and finally resolved by the time Carol arrived in the emergency room.  Since then, chest discomfort has not recurred.  Initial blood work showed potassium 3.4, creatinine 1.69.  Serial troponin 44-->48.  Normal hemoglobin and white blood cell count.  Pregnancy test negative.  Chest x-ray normal.   EKG shows normal sinus rhythm, no significant ST-T wave changes.  Blood pressure while in the emergency room was around 170-200 range.  Cardiology service has been consulted for chest pain in the setting of hypertensive urgency. ? ? ?Past Medical History:  ?Diagnosis Date  ? Abnormal liver function tests   ? Acute kidney injury ([place]) 01/2014  ? Hospitalized, Volume depletion, nausea and vomiting  ? Anxiety   ? 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 Chronic back pain   ? Depression   ? Diabetes mellitus   ? GERD (gastroesophageal reflux disease)   ? Heart murmur   ? Hypertension   ? Palpitations   ? Parkinson's disease ([place])   ? ? ?Past Surgical History:  ?Procedure Laterality Date  ? CARPAL TUNNEL RELEASE    ? OPEN REDUCTION INTERNAL FIXATION (ORIF) FINGER WITH RADIAL BONE GRAFT Right 09/12/2015  ? Procedure: RIGHT MIDDLE FINGER CLOSED REDUCTION AND PINNING;  Surgeon: Iran Planas, MD;  Location: [place];  Service: Orthopedics;  Laterality: Right;  ? TONSILLECTOMY    ? TUBAL LIGATION    ?  ? ?Home Medications:  ?Prior to Admission medications   ?Medication Sig Start Date End Date Taking? Authorizing Provider  ?albuterol (PROVENTIL HFA;VENTOLIN HFA) 108 (90 Base) MCG/ACT inhaler Inhale 2 puffs into the lungs every 6 (six) hours as needed for wheezing or shortness of breath.   Yes [provider]  ?ALPRAZolam Duanne Moron) 1 MG tablet Take 1 mg by mouth 2 (two) times daily as needed  for anxiety.  09/02/15  Yes [provider]  ?amLODipine (NORVASC) 10 MG tablet Take 10 mg by mouth daily.   Yes [provider]  ?cyclobenzaprine (FLEXERIL) 10 MG tablet Take 1 tablet (10 mg total) by mouth 3 (three) times daily as needed for muscle spasms. 05/18/19  Yes Domenic Moras, PA-C  ?diphenhydramine-acetaminophen (TYLENOL PM) 25-500 MG TABS Take 2 tablets by mouth at bedtime as needed (For sleeping disorder.).    Yes [provider]  ?gabapentin (NEURONTIN) 100 MG capsule Take 1 capsule (100  mg total) by mouth at bedtime. ?Carol taking differently: Take 100 mg by mouth 3 (three) times daily. 02/02/14  Yes Regalado, Belkys A, MD  ?glimepiride (AMARYL) 4 MG tablet Take 4 mg by mouth 2 (two) times daily.   Yes [provider]  ?hydrOXYzine (ATARAX) 50 MG tablet Take 50 mg by mouth 3 (three) times daily as needed for anxiety or itching.   Yes [provider]  ?ibuprofen (ADVIL) 600 MG tablet Take 1 tablet (600 mg total) by mouth every 6 (six) hours as needed. ?Carol taking differently: Take 600 mg by mouth every 6 (six) hours as needed for fever or headache. 05/18/19  Yes Domenic Moras, PA-C  ?insulin aspart (NOVOLOG) 100 UNIT/ML FlexPen Inject 16 Units into the skin 2 (two) times daily.   Yes [provider]  ?insulin glargine (LANTUS) 100 UNIT/ML injection Inject 0.25 mLs (25 Units total) into the skin daily. ?Carol taking differently: Inject 60 Units into the skin daily. 02/02/14  Yes Regalado, Belkys A, MD  ?insulin lispro (HUMALOG) 100 UNIT/ML KwikPen Inject 16 Units into the skin 2 (two) times daily.   Yes [provider]  ?metFORMIN (GLUCOPHAGE) 500 MG tablet Take 500 mg by mouth daily before breakfast.    Yes [provider]  ?nebivolol (BYSTOLIC) 10 MG tablet Take 1 tablet (10 mg total) by mouth daily. 05/16/15  Yes Antonietta Breach, PA-C  ?olmesartan-hydrochlorothiazide (BENICAR HCT) 40-25 MG tablet Take 1 tablet by mouth daily. 11/09/21  Yes [provider]  ?oxyCODONE-acetaminophen (PERCOCET/ROXICET) 5-325 MG tablet Take 1-2 tablets by mouth every 6 (six) hours as needed for severe pain. 08/31/15  Yes Cartner, Marland Kitchen, PA-C  ?pantoprazole (PROTONIX) 40 MG tablet Take 1 tablet (40 mg total) by mouth daily at 12 noon. ?Carol taking differently: Take 40 mg by mouth daily as needed (acid reflux). 02/02/14  Yes Regalado, Belkys A, MD  ?QUEtiapine (SEROQUEL) 300 MG tablet Take 300 mg by mouth at bedtime.   Yes [provider]  ?dicyclomine  (BENTYL) 20 MG tablet Take 1 tablet (20 mg total) by mouth every 6 (six) hours. ?Carol not taking: Reported on 11/26/2021 07/30/14   Gatha Mayer, MD  ?docusate sodium (COLACE) 100 MG capsule Take 1 capsule (100 mg total) by mouth 2 (two) times daily. ?Carol not taking: Reported on 11/26/2021 09/12/15   Iran Planas, MD  ?ondansetron [facility]) 4 MG/2ML SOLN injection Inject 2 mLs (4 mg total) into the vein every 6 (six) hours as needed for nausea or vomiting. ?Carol not taking: Reported on 11/26/2021 02/02/14   Niel Hummer A, MD  ?oxyCODONE-acetaminophen (PERCOCET/ROXICET) 5-325 MG tablet Take 1 tablet by mouth every 4 (four) hours as needed for severe pain. ?Carol not taking: Reported on 11/26/2021 04/26/18   Maudie Flakes, MD  ?oxyCODONE-acetaminophen (ROXICET) 5-325 MG tablet Take 1 tablet by mouth every 4 (four) hours as needed for severe pain. ?Carol not taking: Reported on 11/26/2021 09/12/15   Iran Planas,  MD  ?sertraline (ZOLOFT) 25 MG tablet Take 25 mg by mouth daily. 09/02/15   [provider]  ?simvastatin (ZOCOR) 10 MG tablet Take 10 mg by mouth at bedtime.     [provider]  ?triamcinolone ointment (KENALOG) 0.1 % Apply 1 application topically 2 (two) times daily as needed (Apply to feet after showering for dry skin.).     [provider]  ? ? ?Inpatient Medications: ?Scheduled Meds: ? ?Continuous Infusions: ? ?PRN Meds: ? ? ?Allergies:    ?Allergies  ?Allergen Reactions  ? Penicillins Shortness Of Breath and Other (See Comments)  ?  Caused yeast infection ?Has Carol had a PCN reaction causing immediate rash, facial/tongue/throat swelling, SOB or lightheadedness with hypotension: Yes ?Has Carol had a PCN reaction causing severe rash involving mucus membranes or skin necrosis: No ?Has Carol had a PCN reaction that required hospitalization pt was in the hospital at the time of the reaction ?Has Carol had a PCN reaction occurring within the last 10 years: No ?If  all of the above answers are "NO", then may proceed with Cephalosp  ? Tramadol   ? ? ?Social History:   ?Social History  ? ?Socioeconomic History  ? Marital status: Single  ?  Spouse name: Not on file  ?

## 2021-11-26 NOTE — ED Triage Notes (Signed)
Pt BIB EMS from home for chest pressure. Pt c/o headache, dizziness, shob, and weakness. Denies N/V/diaphoresis. Pressure stays at center of chest. Pt was hypertensive w/EMS wBP at 215/130, pt did take her AM BP meds  ?

## 2021-11-26 NOTE — TOC Initial Note (Signed)
Transition of Care (TOC) - Initial/Assessment Note  ? ? ?Patient Details  ?Name: Carol [name] ?MRN: [id number] ?Date of Birth: [birth year] ? ?Transition of Care (TOC) CM/SW Contact:    ?Verdell Carmine, RN ?Phone Number: ?11/26/2021, 3:00 PM ? ?Clinical Narrative:                 ? ?Transition of Care Department [facility]) has reviewed patient and no TOC needs have been identified at this time. We will continue to monitor patient advancement through interdisciplinary progression rounds. If new patient transition needs arise, please place a TOC consult. ?  ?  ?  ?  ? ? ?Patient Goals and CMS Choice ?  ?  ?  ? ?Expected Discharge Plan and Services ?  ?  ?  ?  ?  ?                ?  ?  ?  ?  ?  ?  ?  ?  ?  ?  ? ?Prior Living Arrangements/Services ?  ?  ?  ?       ?  ?  ?  ?  ? ?Activities of Daily Living ?  ?  ? ?Permission Sought/Granted ?  ?  ?   ?   ?   ?   ? ?Emotional Assessment ?  ?  ?  ?  ?  ?  ? ?Admission diagnosis:  Chest Pain ?Patient Active Problem List  ? Diagnosis Date Noted  ? Colon cancer screening 02/18/2014  ? Diabetes mellitus ([place]) 01/29/2014  ? Hypertension 01/29/2014  ? Closed jaw fracture ([place]) 01/29/2014  ? Paresthesia 01/29/2014  ? Syncope 01/29/2014  ? 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 ?PCP:  Simona Huh, NP ?Pharmacy:   ?[id number] [place], [place] ?[address] ?[place] [address] ?Phone: [phone] Fax: [phone] ? ? ? ? ?Social Determinants of Health (SDOH) Interventions ?  ? ?Readmission Risk Interventions ?   ? View : No data to display.  ?  ?  ?  ? ? ? ?

## 2021-11-26 NOTE — Discharge Instructions (Addendum)
You are seen in the ER for chest pain. ? ?Our work-up here indicates that you have elevated blood pressure with slight elevation in heart enzymes.  We discussed the case with cardiology, and the plan right now is to optimize your blood pressure. ? ?Please start taking amlodipine that has been prescribed. ?Keep a log of your blood pressures and follow-up with your primary care doctor in 7 to 10 days. ? ?Additionally, call cardiology doctors for outpatient follow-up to ensure your heart is healthy. ? ?Please return to the ER if you have worsening chest pain, shortness of breath, pain radiating to your jaw, shoulder, or back, sweats or fainting. Otherwise see the Cardiologist or your primary care doctor as requested. ? ?

## 2021-11-26 NOTE — Progress Notes (Signed)
Left voicemail with her pharmacy to cancel hydralazine Rx.  ?

## 2021-12-20 ENCOUNTER — Inpatient Hospital Stay (HOSPITAL_COMMUNITY): Payer: Medicare Other

## 2021-12-20 ENCOUNTER — Encounter (HOSPITAL_COMMUNITY): Payer: Self-pay

## 2021-12-20 ENCOUNTER — Emergency Department (HOSPITAL_COMMUNITY): Payer: Medicare Other

## 2021-12-20 ENCOUNTER — Other Ambulatory Visit: Payer: Self-pay

## 2021-12-20 ENCOUNTER — Encounter (HOSPITAL_COMMUNITY)
Admission: EM | Disposition: A | Discharge status: Home / Self Care | Payer: Self-pay | Source: Home / Self Care | Attending: Internal Medicine

## 2021-12-20 ENCOUNTER — Inpatient Hospital Stay (HOSPITAL_COMMUNITY)
Admission: EM | Admit: 2021-12-20 | Discharge: 2021-12-24 | DRG: 281 | Disposition: A | Discharge status: Home / Self Care | Payer: Medicare Other | Attending: Internal Medicine | Admitting: Internal Medicine

## 2021-12-20 DIAGNOSIS — Z7984 Long term (current) use of oral hypoglycemic drugs: Secondary | ICD-10-CM | POA: Diagnosis not present

## 2021-12-20 DIAGNOSIS — I129 Hypertensive chronic kidney disease with stage 1 through stage 4 chronic kidney disease, or unspecified chronic kidney disease: Secondary | ICD-10-CM | POA: Diagnosis present

## 2021-12-20 DIAGNOSIS — Z823 Family history of stroke: Secondary | ICD-10-CM | POA: Diagnosis not present

## 2021-12-20 DIAGNOSIS — Z841 Family history of disorders of kidney and ureter: Secondary | ICD-10-CM | POA: Diagnosis not present

## 2021-12-20 DIAGNOSIS — I2582 Chronic total occlusion of coronary artery: Secondary | ICD-10-CM | POA: Diagnosis present

## 2021-12-20 DIAGNOSIS — F419 Anxiety disorder, unspecified: Secondary | ICD-10-CM | POA: Diagnosis present

## 2021-12-20 DIAGNOSIS — I214 Non-ST elevation (NSTEMI) myocardial infarction: Secondary | ICD-10-CM

## 2021-12-20 DIAGNOSIS — Z794 Long term (current) use of insulin: Secondary | ICD-10-CM

## 2021-12-20 DIAGNOSIS — E7800 Pure hypercholesterolemia, unspecified: Secondary | ICD-10-CM | POA: Diagnosis present

## 2021-12-20 DIAGNOSIS — Z8249 Family history of ischemic heart disease and other diseases of the circulatory system: Secondary | ICD-10-CM

## 2021-12-20 DIAGNOSIS — K219 Gastro-esophageal reflux disease without esophagitis: Secondary | ICD-10-CM | POA: Diagnosis present

## 2021-12-20 DIAGNOSIS — Z833 Family history of diabetes mellitus: Secondary | ICD-10-CM

## 2021-12-20 DIAGNOSIS — Z20822 Contact with and (suspected) exposure to covid-19: Secondary | ICD-10-CM | POA: Diagnosis present

## 2021-12-20 DIAGNOSIS — Z825 Family history of asthma and other chronic lower respiratory diseases: Secondary | ICD-10-CM

## 2021-12-20 DIAGNOSIS — E1122 Type 2 diabetes mellitus with diabetic chronic kidney disease: Secondary | ICD-10-CM | POA: Diagnosis present

## 2021-12-20 DIAGNOSIS — I2510 Atherosclerotic heart disease of native coronary artery without angina pectoris: Secondary | ICD-10-CM | POA: Diagnosis present

## 2021-12-20 DIAGNOSIS — Z79899 Other long term (current) drug therapy: Secondary | ICD-10-CM | POA: Diagnosis not present

## 2021-12-20 DIAGNOSIS — T508X5A Adverse effect of diagnostic agents, initial encounter: Secondary | ICD-10-CM | POA: Diagnosis not present

## 2021-12-20 DIAGNOSIS — I10 Essential (primary) hypertension: Secondary | ICD-10-CM | POA: Diagnosis not present

## 2021-12-20 DIAGNOSIS — N1411 Contrast-induced nephropathy: Secondary | ICD-10-CM | POA: Diagnosis not present

## 2021-12-20 DIAGNOSIS — F32A Depression, unspecified: Secondary | ICD-10-CM | POA: Diagnosis present

## 2021-12-20 DIAGNOSIS — N179 Acute kidney failure, unspecified: Secondary | ICD-10-CM | POA: Diagnosis not present

## 2021-12-20 DIAGNOSIS — I249 Acute ischemic heart disease, unspecified: Principal | ICD-10-CM

## 2021-12-20 DIAGNOSIS — N183 Chronic kidney disease, stage 3 unspecified: Secondary | ICD-10-CM | POA: Diagnosis not present

## 2021-12-20 DIAGNOSIS — N1832 Chronic kidney disease, stage 3b: Secondary | ICD-10-CM | POA: Diagnosis present

## 2021-12-20 DIAGNOSIS — E119 Type 2 diabetes mellitus without complications: Secondary | ICD-10-CM

## 2021-12-20 DIAGNOSIS — E1169 Type 2 diabetes mellitus with other specified complication: Secondary | ICD-10-CM

## 2021-12-20 DIAGNOSIS — F418 Other specified anxiety disorders: Secondary | ICD-10-CM

## 2021-12-20 HISTORY — PX: LEFT HEART CATH AND CORONARY ANGIOGRAPHY: CATH118249

## 2021-12-20 HISTORY — DX: Non-ST elevation (NSTEMI) myocardial infarction: I21.4

## 2021-12-20 LAB — CBC WITH DIFFERENTIAL/PLATELET
Abs Immature Granulocytes: 0.02 10*3/uL (ref 0.00–0.07)
Basophils Absolute: 0 10*3/uL (ref 0.0–0.1)
Basophils Relative: 1 %
Eosinophils Absolute: 0.3 10*3/uL (ref 0.0–0.5)
Eosinophils Relative: 6 %
HCT: 35.7 % — ABNORMAL LOW (ref 36.0–46.0)
Hemoglobin: 11.6 g/dL — ABNORMAL LOW (ref 12.0–15.0)
Immature Granulocytes: 1 %
Lymphocytes Relative: 35 %
Lymphs Abs: 1.6 10*3/uL (ref 0.7–4.0)
MCH: 30.3 pg (ref 26.0–34.0)
MCHC: 32.5 g/dL (ref 30.0–36.0)
MCV: 93.2 fL (ref 80.0–100.0)
Monocytes Absolute: 0.5 10*3/uL (ref 0.1–1.0)
Monocytes Relative: 11 %
Neutro Abs: 2.1 10*3/uL (ref 1.7–7.7)
Neutrophils Relative %: 46 %
Platelets: 198 10*3/uL (ref 150–400)
RBC: 3.83 MIL/uL — ABNORMAL LOW (ref 3.87–5.11)
RDW: 14 % (ref 11.5–15.5)
WBC: 4.4 10*3/uL (ref 4.0–10.5)
nRBC: 0 % (ref 0.0–0.2)

## 2021-12-20 LAB — I-STAT BETA HCG BLOOD, ED (MC, WL, AP ONLY): I-stat hCG, quantitative: <5 m[IU]/mL (ref <5)

## 2021-12-20 LAB — LIPID PANEL
Cholesterol: 268 mg/dL — ABNORMAL HIGH (ref 0–200)
HDL: 57 mg/dL (ref >40)
LDL Cholesterol: 174 mg/dL — ABNORMAL HIGH (ref 0–99)
Total CHOL/HDL Ratio: 4.7 RATIO
Triglycerides: 183 mg/dL — ABNORMAL HIGH (ref <150)
VLDL: 37 mg/dL (ref 0–40)

## 2021-12-20 LAB — RESP PANEL BY RT-PCR (FLU A&B, COVID) ARPGX2
Influenza A by PCR: NEGATIVE
Influenza B by PCR: NEGATIVE
SARS Coronavirus 2 by RT PCR: NEGATIVE

## 2021-12-20 LAB — COMPREHENSIVE METABOLIC PANEL
ALT: 13 U/L (ref 0–44)
AST: 43 U/L — ABNORMAL HIGH (ref 15–41)
Albumin: 2.1 g/dL — ABNORMAL LOW (ref 3.5–5.0)
Alkaline Phosphatase: 104 U/L (ref 38–126)
Anion gap: 7 (ref 5–15)
BUN: 13 mg/dL (ref 6–20)
CO2: 20 mmol/L — ABNORMAL LOW (ref 22–32)
Calcium: 8.2 mg/dL — ABNORMAL LOW (ref 8.9–10.3)
Chloride: 111 mmol/L (ref 98–111)
Creatinine, Ser: 1.72 mg/dL — ABNORMAL HIGH (ref 0.44–1.00)
GFR, Estimated: 34 mL/min — ABNORMAL LOW (ref >60)
Glucose, Bld: 211 mg/dL — ABNORMAL HIGH (ref 70–99)
Potassium: 3.6 mmol/L (ref 3.5–5.1)
Sodium: 138 mmol/L (ref 135–145)
Total Bilirubin: 0.4 mg/dL (ref 0.3–1.2)
Total Protein: 5.5 g/dL — ABNORMAL LOW (ref 6.5–8.1)

## 2021-12-20 LAB — HEMOGLOBIN A1C
Hgb A1c MFr Bld: 6.4 % — ABNORMAL HIGH (ref 4.8–5.6)
Mean Plasma Glucose: 136.98 mg/dL

## 2021-12-20 LAB — ECHOCARDIOGRAM COMPLETE
AR max vel: 2.82 cm2
AV Area VTI: 2.62 cm2
AV Area mean vel: 2.71 cm2
AV Mean grad: 5 mmHg
AV Peak grad: 7.4 mmHg
Ao pk vel: 1.36 m/s
Area-P 1/2: 4.8 cm2
Height: 64 in
S' Lateral: 3 cm
Weight: 2720 oz

## 2021-12-20 LAB — GLUCOSE, CAPILLARY
Glucose-Capillary: 100 mg/dL — ABNORMAL HIGH (ref 70–99)
Glucose-Capillary: 252 mg/dL — ABNORMAL HIGH (ref 70–99)

## 2021-12-20 LAB — APTT: aPTT: 25 seconds (ref 24–36)

## 2021-12-20 LAB — CBG MONITORING, ED
Glucose-Capillary: 209 mg/dL — ABNORMAL HIGH (ref 70–99)
Glucose-Capillary: 211 mg/dL — ABNORMAL HIGH (ref 70–99)
Glucose-Capillary: 131 mg/dL — ABNORMAL HIGH (ref 70–99)

## 2021-12-20 LAB — PROTIME-INR
INR: 0.9 (ref 0.8–1.2)
Prothrombin Time: 12.5 seconds (ref 11.4–15.2)

## 2021-12-20 LAB — BRAIN NATRIURETIC PEPTIDE: B Natriuretic Peptide: 202.9 pg/mL — ABNORMAL HIGH (ref 0.0–100.0)

## 2021-12-20 LAB — TROPONIN I (HIGH SENSITIVITY)
Troponin I (High Sensitivity): 1895 ng/L (ref <18)
Troponin I (High Sensitivity): 8013 ng/L (ref <18)

## 2021-12-20 LAB — HIV ANTIBODY (ROUTINE TESTING W REFLEX): HIV Screen 4th Generation wRfx: NONREACTIVE

## 2021-12-20 LAB — HEPARIN LEVEL (UNFRACTIONATED): Heparin Unfractionated: 0.43 IU/mL (ref 0.30–0.70)

## 2021-12-20 SURGERY — LEFT HEART CATH AND CORONARY ANGIOGRAPHY
Anesthesia: LOCAL

## 2021-12-20 MED ORDER — ATORVASTATIN CALCIUM 80 MG PO TABS
80.0000 mg | ORAL_TABLET | Freq: Every day | ORAL | Status: DC
  Administered 2021-12-20 – 2021-12-24 (×5): 80 mg via ORAL
  Filled 2021-12-20 (×4): qty 1
  Filled 2021-12-20: qty 2

## 2021-12-20 MED ORDER — CLOPIDOGREL BISULFATE 75 MG PO TABS
75.0000 mg | ORAL_TABLET | Freq: Every day | ORAL | Status: DC
  Administered 2021-12-22 – 2021-12-24 (×3): 75 mg via ORAL
  Filled 2021-12-20 (×5): qty 1

## 2021-12-20 MED ORDER — CLOPIDOGREL BISULFATE 300 MG PO TABS
600.0000 mg | ORAL_TABLET | Freq: Once | ORAL | Status: DC
  Filled 2021-12-20: qty 2

## 2021-12-20 MED ORDER — HEPARIN SODIUM (PORCINE) 5000 UNIT/ML IJ SOLN
INTRAMUSCULAR | Status: AC
  Administered 2021-12-20: 4000 [IU] via INTRAVENOUS
  Filled 2021-12-20: qty 1

## 2021-12-20 MED ORDER — HYDRALAZINE HCL 50 MG PO TABS
50.0000 mg | ORAL_TABLET | Freq: Three times a day (TID) | ORAL | Status: DC
  Administered 2021-12-20 – 2021-12-21 (×3): 50 mg via ORAL
  Filled 2021-12-20 (×2): qty 1
  Filled 2021-12-20 (×2): qty 2

## 2021-12-20 MED ORDER — NITROGLYCERIN 0.4 MG SL SUBL
0.4000 mg | SUBLINGUAL_TABLET | SUBLINGUAL | Status: DC | PRN
Start: 2021-12-20 — End: 2021-12-24

## 2021-12-20 MED ORDER — FENTANYL CITRATE (PF) 100 MCG/2ML IJ SOLN
INTRAMUSCULAR | Status: DC | PRN
  Administered 2021-12-20: 50 ug via INTRAVENOUS

## 2021-12-20 MED ORDER — ASPIRIN EC 81 MG PO TBEC
81.0000 mg | DELAYED_RELEASE_TABLET | Freq: Every day | ORAL | Status: DC
  Administered 2021-12-20 – 2021-12-24 (×5): 81 mg via ORAL
  Filled 2021-12-20 (×5): qty 1

## 2021-12-20 MED ORDER — HYDROMORPHONE HCL 1 MG/ML IJ SOLN
0.5000 mg | Freq: Once | INTRAMUSCULAR | Status: AC
  Administered 2021-12-20: 0.5 mg via INTRAVENOUS
  Filled 2021-12-20: qty 1

## 2021-12-20 MED ORDER — NEBIVOLOL HCL 10 MG PO TABS
20.0000 mg | ORAL_TABLET | Freq: Every day | ORAL | Status: DC
  Administered 2021-12-20 – 2021-12-24 (×5): 20 mg via ORAL
  Filled 2021-12-20 (×5): qty 2

## 2021-12-20 MED ORDER — AMLODIPINE BESYLATE 10 MG PO TABS
10.0000 mg | ORAL_TABLET | Freq: Every day | ORAL | Status: DC
Start: 2021-12-20 — End: 2021-12-24
  Administered 2021-12-20 – 2021-12-24 (×5): 10 mg via ORAL
  Filled 2021-12-20 (×4): qty 1
  Filled 2021-12-20: qty 2

## 2021-12-20 MED ORDER — INSULIN ASPART 100 UNIT/ML IJ SOLN
0.0000 [IU] | Freq: Three times a day (TID) | INTRAMUSCULAR | Status: DC
  Administered 2021-12-20: 3 [IU] via SUBCUTANEOUS
  Administered 2021-12-20: 7 [IU] via SUBCUTANEOUS
  Administered 2021-12-21: 4 [IU] via SUBCUTANEOUS
  Administered 2021-12-21: 11 [IU] via SUBCUTANEOUS
  Administered 2021-12-21: 4 [IU] via SUBCUTANEOUS
  Administered 2021-12-22 (×3): 3 [IU] via SUBCUTANEOUS
  Administered 2021-12-23 (×2): 4 [IU] via SUBCUTANEOUS
  Administered 2021-12-24: 3 [IU] via SUBCUTANEOUS

## 2021-12-20 MED ORDER — INSULIN GLARGINE-YFGN 100 UNIT/ML ~~LOC~~ SOLN
10.0000 [IU] | Freq: Every day | SUBCUTANEOUS | Status: DC
  Administered 2021-12-20 – 2021-12-23 (×4): 10 [IU] via SUBCUTANEOUS
  Filled 2021-12-20 (×5): qty 0.1

## 2021-12-20 MED ORDER — ALPRAZOLAM 0.5 MG PO TABS
1.0000 mg | ORAL_TABLET | Freq: Two times a day (BID) | ORAL | Status: DC | PRN
  Administered 2021-12-20 – 2021-12-22 (×3): 1 mg via ORAL
  Filled 2021-12-20: qty 4
  Filled 2021-12-20 (×2): qty 2

## 2021-12-20 MED ORDER — HEPARIN (PORCINE) IN NACL 1000-0.9 UT/500ML-% IV SOLN
INTRAVENOUS | Status: AC
  Filled 2021-12-20: qty 1000

## 2021-12-20 MED ORDER — METOPROLOL TARTRATE 5 MG/5ML IV SOLN
5.0000 mg | INTRAVENOUS | Status: DC | PRN
  Administered 2021-12-20: 5 mg via INTRAVENOUS
  Filled 2021-12-20: qty 5

## 2021-12-20 MED ORDER — SODIUM CHLORIDE 0.9 % IV SOLN: 250.0000 mL | INTRAVENOUS | Status: DC | PRN

## 2021-12-20 MED ORDER — OLMESARTAN MEDOXOMIL-HCTZ 40-25 MG PO TABS: 1.0000 | ORAL_TABLET | Freq: Every day | ORAL | Status: DC

## 2021-12-20 MED ORDER — ASPIRIN 81 MG PO CHEW: 324.0000 mg | CHEWABLE_TABLET | Freq: Once | ORAL | Status: DC

## 2021-12-20 MED ORDER — SERTRALINE HCL 50 MG PO TABS
50.0000 mg | ORAL_TABLET | Freq: Every day | ORAL | Status: DC
  Administered 2021-12-20 – 2021-12-24 (×5): 50 mg via ORAL
  Filled 2021-12-20 (×5): qty 1

## 2021-12-20 MED ORDER — HYDRALAZINE HCL 20 MG/ML IJ SOLN: 10.0000 mg | INTRAMUSCULAR | Status: DC | PRN

## 2021-12-20 MED ORDER — LIDOCAINE HCL (PF) 1 % IJ SOLN
INTRAMUSCULAR | Status: AC
Start: 2021-12-20 — End: ?
  Filled 2021-12-20: qty 30

## 2021-12-20 MED ORDER — ONDANSETRON HCL 4 MG/2ML IJ SOLN: 4.0000 mg | Freq: Four times a day (QID) | INTRAMUSCULAR | Status: DC | PRN

## 2021-12-20 MED ORDER — QUETIAPINE FUMARATE 300 MG PO TABS
300.0000 mg | ORAL_TABLET | Freq: Every day | ORAL | Status: DC
  Administered 2021-12-20 – 2021-12-23 (×4): 300 mg via ORAL
  Filled 2021-12-20 (×5): qty 1

## 2021-12-20 MED ORDER — IOHEXOL 350 MG/ML SOLN
INTRAVENOUS | Status: DC | PRN
  Administered 2021-12-20: 45 mL

## 2021-12-20 MED ORDER — ASPIRIN EC 81 MG PO TBEC: 81.0000 mg | DELAYED_RELEASE_TABLET | Freq: Every day | ORAL | Status: DC

## 2021-12-20 MED ORDER — SODIUM CHLORIDE 0.9% FLUSH
3.0000 mL | Freq: Two times a day (BID) | INTRAVENOUS | Status: DC
  Administered 2021-12-21 – 2021-12-23 (×4): 3 mL via INTRAVENOUS

## 2021-12-20 MED ORDER — HEPARIN SODIUM (PORCINE) 1000 UNIT/ML IJ SOLN
INTRAMUSCULAR | Status: DC | PRN
  Administered 2021-12-20: 4000 [IU] via INTRAVENOUS

## 2021-12-20 MED ORDER — PERFLUTREN LIPID MICROSPHERE
1.0000 mL | INTRAVENOUS | Status: AC | PRN
  Administered 2021-12-20: 2 mL via INTRAVENOUS
  Filled 2021-12-20: qty 10

## 2021-12-20 MED ORDER — HEPARIN (PORCINE) IN NACL 1000-0.9 UT/500ML-% IV SOLN
INTRAVENOUS | Status: DC | PRN
  Administered 2021-12-20 (×2): 500 mL

## 2021-12-20 MED ORDER — HYDROXYZINE HCL 25 MG PO TABS: 50.0000 mg | ORAL_TABLET | Freq: Three times a day (TID) | ORAL | Status: DC | PRN

## 2021-12-20 MED ORDER — HEPARIN SODIUM (PORCINE) 5000 UNIT/ML IJ SOLN
4000.0000 [IU] | Freq: Once | INTRAMUSCULAR | Status: AC
  Filled 2021-12-20: qty 1

## 2021-12-20 MED ORDER — IRBESARTAN 300 MG PO TABS
300.0000 mg | ORAL_TABLET | Freq: Every day | ORAL | Status: DC
  Filled 2021-12-20: qty 1

## 2021-12-20 MED ORDER — HYDRALAZINE HCL 20 MG/ML IJ SOLN
10.0000 mg | Freq: Once | INTRAMUSCULAR | Status: DC
Start: 2021-12-20 — End: 2021-12-20

## 2021-12-20 MED ORDER — VERAPAMIL HCL 2.5 MG/ML IV SOLN
INTRAVENOUS | Status: AC
  Filled 2021-12-20: qty 2

## 2021-12-20 MED ORDER — ACETAMINOPHEN 325 MG PO TABS: 650.0000 mg | ORAL_TABLET | ORAL | Status: DC | PRN

## 2021-12-20 MED ORDER — LABETALOL HCL 5 MG/ML IV SOLN
10.0000 mg | INTRAVENOUS | Status: DC | PRN
  Administered 2021-12-20: 10 mg via INTRAVENOUS
  Filled 2021-12-20: qty 4

## 2021-12-20 MED ORDER — HEPARIN SODIUM (PORCINE) 1000 UNIT/ML IJ SOLN
INTRAMUSCULAR | Status: AC
Start: 2021-12-20 — End: ?
  Filled 2021-12-20: qty 10

## 2021-12-20 MED ORDER — HYDROCHLOROTHIAZIDE 25 MG PO TABS: 25.0000 mg | ORAL_TABLET | Freq: Every day | ORAL | Status: DC

## 2021-12-20 MED ORDER — HEPARIN (PORCINE) 25000 UT/250ML-% IV SOLN: 900.0000 [IU]/h | INTRAVENOUS | Status: DC

## 2021-12-20 MED ORDER — HEPARIN (PORCINE) 25000 UT/250ML-% IV SOLN
INTRAVENOUS | Status: AC
  Administered 2021-12-20: 900 [IU]/h via INTRAVENOUS
  Filled 2021-12-20: qty 250

## 2021-12-20 MED ORDER — SODIUM CHLORIDE 0.9 % IV SOLN: INTRAVENOUS | Status: DC

## 2021-12-20 MED ORDER — FENTANYL CITRATE (PF) 100 MCG/2ML IJ SOLN
INTRAMUSCULAR | Status: AC
  Filled 2021-12-20: qty 2

## 2021-12-20 MED ORDER — NITROGLYCERIN IN D5W 200-5 MCG/ML-% IV SOLN
0.0000 ug/min | INTRAVENOUS | Status: DC
  Administered 2021-12-20: 20 ug/min via INTRAVENOUS
  Filled 2021-12-20: qty 250

## 2021-12-20 MED ORDER — MIDAZOLAM HCL 2 MG/2ML IJ SOLN
INTRAMUSCULAR | Status: DC | PRN
  Administered 2021-12-20: 2 mg via INTRAVENOUS

## 2021-12-20 MED ORDER — LIDOCAINE HCL (PF) 1 % IJ SOLN
INTRAMUSCULAR | Status: DC | PRN
  Administered 2021-12-20: 2 mL

## 2021-12-20 MED ORDER — SODIUM CHLORIDE 0.9% FLUSH: 3.0000 mL | INTRAVENOUS | Status: DC | PRN

## 2021-12-20 MED ORDER — MIDAZOLAM HCL 2 MG/2ML IJ SOLN
INTRAMUSCULAR | Status: AC
Start: 2021-12-20 — End: ?
  Filled 2021-12-20: qty 2

## 2021-12-20 MED ORDER — INSULIN ASPART 100 UNIT/ML IJ SOLN
0.0000 [IU] | Freq: Every day | INTRAMUSCULAR | Status: DC
  Administered 2021-12-20: 3 [IU] via SUBCUTANEOUS
  Administered 2021-12-21: 2 [IU] via SUBCUTANEOUS

## 2021-12-20 MED ORDER — VERAPAMIL HCL 2.5 MG/ML IV SOLN
INTRAVENOUS | Status: DC | PRN
  Administered 2021-12-20: 10 mL via INTRA_ARTERIAL

## 2021-12-20 SURGICAL SUPPLY — 10 items
CATH 5FR JL3.5 JR4 ANG PIG MP (CATHETERS) ×1 IMPLANT
DEVICE RAD COMP TR BAND LRG (VASCULAR PRODUCTS) ×1 IMPLANT
GLIDESHEATH SLEND SS 6F .021 (SHEATH) ×1 IMPLANT
GUIDEWIRE INQWIRE 1.5J.035X260 (WIRE) IMPLANT
INQWIRE 1.5J .035X260CM (WIRE) ×2
KIT HEART LEFT (KITS) ×2 IMPLANT
PACK CARDIAC CATHETERIZATION (CUSTOM PROCEDURE TRAY) ×2 IMPLANT
SYR MEDRAD MARK 7 150ML (SYRINGE) ×2 IMPLANT
TRANSDUCER W/STOPCOCK (MISCELLANEOUS) ×2 IMPLANT
TUBING CIL FLEX 10 FLL-RA (TUBING) ×2 IMPLANT

## 2021-12-20 NOTE — Progress Notes (Signed)
Attending Addendum ? ?Carol [name] is a 45F with diabetes, CKD 3,?sarcoidosis, and uncontrolled hypertension admitted with NSTEMI.  She has struggled with her blood pressure lately despite taking her medicines regularly.  Yesterday she had chest pressure throughout the day.  She then awoke with chest pain and shortness of breath.  Symptoms were exertional.  She is also had some intermittent lower extremity edema.  High-sensitivity troponin is elevated to 1,895-->8,013.  EKG with inferior lateral T wave inversions new compared with 11/28/21.  She was started on a nitroglycerin drip as well as a heparin drip.  Creatinine was 1.72 compared with 1.693 weeks ago.  It has been as low as 1.4 1 year ago.  Overall, her findings are concerning for NSTEMI.  We did discuss the risk of nephrotoxicity with cardiac catheterization.  However given her chest pain, elevated troponins, and EKG changes, I do think it is prudent that we proceed with cardiac catheterization today and she is in agreement.  We will give very gentle IV fluids as well as order an echocardiogram.  We will hold her home HCTZ and ARB until after her cath.  Given that we are holding these, we will add hydralazine.  Continue amlodipine and nitroglycerin drip. ? ?Shared Decision Making/Informed Consent ?The risks [stroke (1 in 1000), death (1 in 71), kidney failure [usually temporary] (1 in 500), bleeding (1 in 200), allergic reaction [possibly serious] (1 in 200)], benefits (diagnostic support and management of coronary artery disease) and alternatives of a cardiac catheterization were discussed in detail with Carol [name] and she is willing to proceed. ? ?Pantelis Elgersma C. Oval Linsey, MD, [facility] ?12/20/2021 ?10:05 AM ? ?

## 2021-12-20 NOTE — ED Notes (Signed)
Patient transported to cath lab

## 2021-12-20 NOTE — ED Triage Notes (Signed)
Chest pressure that woke patient up from sleeping. Patient denies any cardiac history. ?

## 2021-12-20 NOTE — ED Provider Notes (Signed)
?[place] DEPT ?[facility] Emergency Department ?Provider Note ?MRN:  [id number]  ?Arrival date & time: 12/20/21    ? ?Chief Complaint   ?Chest pain ?History of Present Illness   ?Carol [name] is a 61 y.o. year-old female with a history of hypertension, diabetes presenting to the ED with chief complaint of chest pain. ? ?Sudden onset chest pain 1 hour ago described as a tightness and pressure.  Code STEMI initiated in route with EMS ? ?Review of Systems  ?A thorough review of systems was obtained and all systems are negative except as noted in the HPI and PMH.  ? ?Patient's Health History   ? ?Past Medical History:  ?Diagnosis Date  ? Abnormal liver function tests   ? Acute kidney injury ([place]) 01/2014  ? Hospitalized, Volume depletion, nausea and vomiting  ? Anxiety   ? 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 Chronic back pain   ? Depression   ? Diabetes mellitus   ? GERD (gastroesophageal reflux disease)   ? Heart murmur   ? Hypertension   ? Palpitations   ? Parkinson's disease ([place])   ?  ?Past Surgical History:  ?Procedure Laterality Date  ? CARPAL TUNNEL RELEASE    ? OPEN REDUCTION INTERNAL FIXATION (ORIF) FINGER WITH RADIAL BONE GRAFT Right 09/12/2015  ? Procedure: RIGHT MIDDLE FINGER CLOSED REDUCTION AND PINNING;  Surgeon: Iran Planas, MD;  Location: [place];  Service: Orthopedics;  Laterality: Right;  ? TONSILLECTOMY    ? TUBAL LIGATION    ?  ?Family History  ?Problem Relation Age of Onset  ? Diabetes Mother   ? Kidney disease Mother   ? Seizures Father   ? Hypertension Father   ? Stroke Father   ? Asthma Other   ?  ?Social History  ? ?Socioeconomic History  ? Marital status: Single  ?  Spouse name: Not on file  ? Number of children: 3  ? Years of education: Not on file  ? Highest education level: Not on file  ?Occupational History  ? Not on file  ?Tobacco Use  ? Smoking status: Some Days  ?  Packs/day: 0.25  ?  Years: 3.00  ?  Pack years: 0.75  ?  Types: Cigarettes  ?  Smokeless tobacco: Never  ?Vaping Use  ? Vaping Use: Never used  ?Substance and Sexual Activity  ? Alcohol use: Yes  ?  Alcohol/week: 0.0 standard drinks  ?  Comment: occasional  ? Drug use: No  ?  Comment: none for 13 years  ? Sexual activity: Not on file  ?Other Topics Concern  ? Not on file  ?Social History Narrative  ? Not on file  ? ?Social Determinants of Health  ? ?Financial Resource Strain: Not on file  ?Food Insecurity: Not on file  ?Transportation Needs: Not on file  ?Physical Activity: Not on file  ?Stress: Not on file  ?Social Connections: Not on file  ?Intimate Partner Violence: Not on file  ?  ? ?Physical Exam  ? ?Vitals:  ? 12/20/21 0355  ?BP: (!) 162/95  ?Pulse: 71  ?Resp: 16  ?Temp: 97.9 ?F (36.6 ?C)  ?SpO2: 98%  ?  ?CONSTITUTIONAL: Well-appearing, NAD ?NEURO/PSYCH:  Alert and oriented x 3, no focal deficits ?EYES:  eyes equal and reactive ?ENT/NECK:  no LAD, no JVD ?CARDIO: Regular rate, well-perfused, normal S1 and S2 ?PULM:  CTAB no wheezing or rhonchi ?GI/GU:  non-distended, non-tender ?MSK/SPINE:  No gross deformities, no edema ?SKIN:  no rash,  atraumatic ? ? ?*Additional and/or pertinent findings included in MDM below ? ?Diagnostic and Interventional Summary  ? ? EKG Interpretation ? ?Date/Time:  Wednesday Dec 20 2021 03:46:34 EDT ?Ventricular Rate:  71 ?PR Interval:  116 ?QRS Duration: 90 ?QT Interval:  456 ?QTC Calculation: 496 ?R Axis:   18 ?Text Interpretation: Sinus rhythm Borderline short PR interval LVH with secondary repolarization abnormality Abnormal inferior Q waves Borderline ST elevation, inferior leads Borderline prolonged QT interval Confirmed by Gerlene Fee [phone]) on 12/20/2021 4:08:33 AM ?  ? ?  ? ?Labs Reviewed  ?HEMOGLOBIN A1C - Abnormal; Notable for the following components:  ?    Result Value  ? Hgb A1c MFr Bld 6.4 (*)   ? All other components within normal limits  ?CBC WITH DIFFERENTIAL/PLATELET - Abnormal; Notable for the following components:  ? RBC 3.83 (*)   ?  Hemoglobin 11.6 (*)   ? HCT 35.7 (*)   ? All other components within normal limits  ?COMPREHENSIVE METABOLIC PANEL - Abnormal; Notable for the following components:  ? CO2 20 (*)   ? Glucose, Bld 211 (*)   ? Creatinine, Ser 1.72 (*)   ? Calcium 8.2 (*)   ? Total Protein 5.5 (*)   ? Albumin 2.1 (*)   ? AST 43 (*)   ? GFR, Estimated 34 (*)   ? All other components within normal limits  ?LIPID PANEL - Abnormal; Notable for the following components:  ? Cholesterol 268 (*)   ? Triglycerides 183 (*)   ? LDL Cholesterol 174 (*)   ? All other components within normal limits  ?CBG MONITORING, ED - Abnormal; Notable for the following components:  ? Glucose-Capillary 209 (*)   ? All other components within normal limits  ?TROPONIN I (HIGH SENSITIVITY) - Abnormal; Notable for the following components:  ? Troponin I (High Sensitivity) 1,895 (*)   ? All other components within normal limits  ?RESP PANEL BY RT-PCR (FLU A&B, COVID) ARPGX2  ?PROTIME-INR  ?APTT  ?HEPARIN LEVEL (UNFRACTIONATED)  ?I-STAT BETA HCG BLOOD, ED ([facility], [facility], AP ONLY)  ?  ?DG Chest Port 1 View  ?Final Result  ?  ?  ?Medications  ?0.9 %  sodium chloride infusion ( Intravenous New Bag/Given 12/20/21 0400)  ?heparin 25000 UT/250ML infusion (has no administration in time range)  ?metoprolol tartrate (LOPRESSOR) injection 5 mg (5 mg Intravenous Given 12/20/21 0437)  ?heparin ADULT infusion 100 units/mL (25000 units/224mL) (has no administration in time range)  ?heparin injection 4,000 Units (4,000 Units Intravenous Given 12/20/21 0438)  ?HYDROmorphone (DILAUDID) injection 0.5 mg (0.5 mg Intravenous Given 12/20/21 0401)  ?HYDROmorphone (DILAUDID) injection 0.5 mg (0.5 mg Intravenous Given 12/20/21 0445)  ?  ? ?Procedures  /  Critical Care ?.Critical Care ?Performed by: Maudie Flakes, MD ?Authorized by: Maudie Flakes, MD  ? ?Critical care provider statement:  ?  Critical care time (minutes):  35 ?  Critical care was necessary to treat or prevent imminent or life-threatening  deterioration of the following conditions: Acute coronary syndrome. ?  Critical care was time spent personally by me on the following activities:  Development of treatment plan with patient or surrogate, discussions with consultants, evaluation of patient's response to treatment, examination of patient, ordering and review of laboratory studies, ordering and review of radiographic studies, ordering and performing treatments and interventions, pulse oximetry, re-evaluation of patient's condition and review of old charts ? ?ED Course and Medical Decision Making  ?Initial Impression and Ddx ?Severe sudden onset  chest pain, prearrival EKG demonstrating ST elevation inferiorly with reciprocal depression laterally.  Code STEMI initiated.  Dynamic EKG changes seen with EMS 12 leads, her abnormalities seem to be normalizing.  Arrival EKG here in the emergency department still demonstrating some T wave and ST segment changes laterally and inferiorly and so the concern for acute coronary syndrome is present.  Case discussed with Dr. Tamala Julian as well as Dr. Clayton Bibles, will hold off on emergent cath at this time.  Treating pain, blood pressure, monitoring closely, following up on troponin.  Patient received aspirin, providing heparin.  Anticipating admission. ? ?Past medical/surgical history that increases complexity of ED encounter: Hypertension, diabetes ? ?Interpretation of Diagnostics ?I personally reviewed the EKG and my interpretation is as follows: Serial EKGs personally reviewed by me revealing dynamic changes as described above, some of them concerning for ST segment elevation myocardial infarction ?   ?Labs most notable for a troponin of 1800 ? ?Patient Reassessment and Ultimate Disposition/Management ?Plan is for cardiology admission. ? ?Patient management required discussion with the following services or consulting groups:  Cardiology ? ?Complexity of Problems Addressed ?Acute illness or injury that poses threat of life  of bodily function ? ?Additional Data Reviewed and Analyzed ?Further history obtained from: ?EMS on arrival ? ?Additional Factors Impacting ED Encounter Risk ?Use of parenteral controlled substances and Considerati

## 2021-12-20 NOTE — ED Notes (Signed)
Spoke with pharmacy regarding patient heparin. Pharmacy reports heparin dosage will be placed and released promptly. ?

## 2021-12-20 NOTE — H&P (Signed)
?Cardiology Admission History and Physical:  ? ?Patient ID: Carol [name] ?MRN: [id number]; DOB: 1961/08/18  ? ?Admission date: 12/20/2021 ? ?PCP:  Simona Huh, NP ?  ?[facility] Providers ?Cardiologist:  None      ? ?Chief Complaint:  "I woke up and my chest was hurting" ? ?Patient Profile:  ? ?Carol [name] is a 61 y.o. female with hx of poorly controlled hypertension, type 2 diabetes mellitus, CKD III, possible sarcoidosis (?), and anxiety/depression who is being seen 12/20/2021 for the evaluation of NSTEMI. ? ?History of Present Illness:  ? ?[name] presented to the ED about 3 weeks ago with substernal chest discomfort. Her EKG was relatively unremarkable and her troponins were slightly elevated but flat. This was felt to be due to poorly controlled hypertension, and she was d/c'd with an additional blood pressure agent.  ? ?This evening, Ms. Robsinson tells me she really has been slowing down for some time. She is able to do simple tasks like walking to her bathroom or cook a meal without becoming short of breath but anything much more than this leaves her winded.  She had not had any chest pain prior to the episode 3 weeks ago, and since this episode she has not had any subsequent chest pain.  She does have occasional palpitations that she reports she can "feel in her neck," but again no chest pain associated with this.  This evening she woke up around 3 AM with intense substernal pressure.  She said it was similar to the prior episode but much more intense.  She reports that the pain radiated to her left arm.  She immediately called 911.  In route there was concern for a STEMI based on a strip that appeared to have a right bundle branch block.  Her twelve-lead here showed some subtle inferior ST elevations but certainly did not meet criteria for a STEMI.  She was given nitro with some improvement in her chest pain although it did not fully resolve.  She required multiple doses of  narcotics and still did not have full resolution of her chest discomfort. ? ?In route to the ED she was hypertensive to systolic 740C.  She has remained hypertensive in the emergency department.  We performed a twelve-lead EKG with posterior leads with still no evidence of STEMI.  Her initial troponin was markedly elevated at [place].  She was started on a heparin drip as well as a nitroglycerin drip to both control her chest pain and blood pressure. ? ? ?Past Medical History:  ?Diagnosis Date  ? Abnormal liver function tests   ? Acute kidney injury ([place]) 01/2014  ? Hospitalized, Volume depletion, nausea and vomiting  ? Anxiety   ? 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 Chronic back pain   ? Depression   ? Diabetes mellitus   ? GERD (gastroesophageal reflux disease)   ? Heart murmur   ? Hypertension   ? Palpitations   ? Parkinson's disease ([place])   ? ? ?Past Surgical History:  ?Procedure Laterality Date  ? CARPAL TUNNEL RELEASE    ? OPEN REDUCTION INTERNAL FIXATION (ORIF) FINGER WITH RADIAL BONE GRAFT Right 09/12/2015  ? Procedure: RIGHT MIDDLE FINGER CLOSED REDUCTION AND PINNING;  Surgeon: Iran Planas, MD;  Location: [facility];  Service: Orthopedics;  Laterality: Right;  ? TONSILLECTOMY    ? TUBAL LIGATION    ?  ? ?Medications Prior to Admission: ?Prior to Admission medications   ?Medication Sig Start Date  End Date Taking? Authorizing Provider  ?albuterol (PROVENTIL HFA;VENTOLIN HFA) 108 (90 Base) MCG/ACT inhaler Inhale 2 puffs into the lungs every 6 (six) hours as needed for wheezing or shortness of breath.    [provider]  ?ALPRAZolam Duanne Moron) 1 MG tablet Take 1 mg by mouth 2 (two) times daily as needed for anxiety.  09/02/15   [provider]  ?amLODipine (NORVASC) 10 MG tablet Take 1 tablet (10 mg total) by mouth daily. 11/26/21   Varney Biles, MD  ?budesonide-formoterol (SYMBICORT) 160-4.5 MCG/ACT inhaler Inhale 2 puffs into the lungs 2 (two) times daily as needed. 11/09/21   [provider]  ?Buprenorphine HCl-Naloxone HCl 4-1 MG FILM Place 0.5 strips under the tongue daily as needed (narcotic dependance). 08/01/21   [provider]  ?cetirizine (ZYRTEC) 10 MG tablet Take 10 mg by mouth daily as needed for allergies.    [provider]  ?cyclobenzaprine (FLEXERIL) 10 MG tablet Take 1 tablet (10 mg total) by mouth 3 (three) times daily as needed for muscle spasms. 05/18/19   Domenic Moras, PA-C  ?dicyclomine (BENTYL) 20 MG tablet Take 1 tablet (20 mg total) by mouth every 6 (six) hours. ?Patient not taking: Reported on 11/26/2021 07/30/14   Carol Mayer, MD  ?diphenhydramine-acetaminophen (TYLENOL PM) 25-500 MG TABS Take 2 tablets by mouth at bedtime as needed (For sleeping disorder.).     [provider]  ?docusate sodium (COLACE) 100 MG capsule Take 1 capsule (100 mg total) by mouth 2 (two) times daily. ?Patient not taking: Reported on 11/26/2021 09/12/15   Iran Planas, MD  ?ergocalciferol (VITAMIN D2) 1.25 MG (50000 UT) capsule Take 50,000 Units by mouth once a week. Tuesdays    [provider]  ?gabapentin (NEURONTIN) 100 MG capsule Take 1 capsule (100 mg total) by mouth at bedtime. ?Patient taking differently: Take 100 mg by mouth 3 (three) times daily. 02/02/14   Regalado, Belkys A, MD  ?glimepiride (AMARYL) 4 MG tablet Take 4 mg by mouth 2 (two) times daily.    [provider]  ?hydrOXYzine (ATARAX) 50 MG tablet Take 50 mg by mouth 3 (three) times daily as needed for anxiety or itching.    [provider]  ?ibuprofen (ADVIL) 600 MG tablet Take 1 tablet (600 mg total) by mouth every 6 (six) hours as needed. ?Patient taking differently: Take 600 mg by mouth every 6 (six) hours as needed for fever or headache. 05/18/19   Domenic Moras, PA-C  ?insulin aspart (NOVOLOG) 100 UNIT/ML FlexPen Inject 16 Units into the skin 2 (two) times daily.    [provider]  ?insulin glargine (LANTUS) 100 UNIT/ML injection Inject 0.25 mLs (25 Units  total) into the skin daily. ?Patient taking differently: Inject 60 Units into the skin daily. 02/02/14   Regalado, Belkys A, MD  ?insulin lispro (HUMALOG) 100 UNIT/ML KwikPen Inject 16 Units into the skin 2 (two) times daily.    [provider]  ?metFORMIN (GLUCOPHAGE) 500 MG tablet Take 500 mg by mouth daily before breakfast.     [provider]  ?nebivolol (BYSTOLIC) 10 MG tablet Take 1 tablet (10 mg total) by mouth daily. ?Patient not taking: Reported on 12/20/2021 05/16/15   Antonietta Breach, PA-C  ?Nebivolol HCl 20 MG TABS Take 20 mg by mouth daily. 12/07/21   [provider]  ?olmesartan-hydrochlorothiazide (BENICAR HCT) 40-25 MG tablet Take 1 tablet by mouth daily. 11/09/21   [provider]  ?ondansetron (ZOFRAN) 4 MG/2ML SOLN injection Inject 2  mLs (4 mg total) into the vein every 6 (six) hours as needed for nausea or vomiting. ?Patient not taking: Reported on 11/26/2021 02/02/14   Niel Hummer A, MD  ?oxyCODONE-acetaminophen (PERCOCET/ROXICET) 5-325 MG tablet Take 1-2 tablets by mouth every 6 (six) hours as needed for severe pain. 08/31/15   Comer Locket, PA-C  ?oxyCODONE-acetaminophen (PERCOCET/ROXICET) 5-325 MG tablet Take 1 tablet by mouth every 4 (four) hours as needed for severe pain. ?Patient not taking: Reported on 11/26/2021 04/26/18   Maudie Flakes, MD  ?oxyCODONE-acetaminophen (ROXICET) 5-325 MG tablet Take 1 tablet by mouth every 4 (four) hours as needed for severe pain. ?Patient not taking: Reported on 11/26/2021 09/12/15   Iran Planas, MD  ?pantoprazole (PROTONIX) 40 MG tablet Take 1 tablet (40 mg total) by mouth daily at 12 noon. ?Patient taking differently: Take 40 mg by mouth daily as needed (acid reflux). 02/02/14   Regalado, Belkys A, MD  ?pravastatin (PRAVACHOL) 40 MG tablet Take 40 mg by mouth daily.    [provider]  ?QUEtiapine (SEROQUEL XR) 300 MG 24 hr tablet Take 300 mg by mouth at bedtime. 12/07/21   [provider]  ?QUEtiapine  (SEROQUEL) 300 MG tablet Take 300 mg by mouth at bedtime. ?Patient not taking: Reported on 12/20/2021    [provider]  ?sertraline (ZOLOFT) 50 MG tablet Take 50 mg by mouth daily.    Provider, Hist

## 2021-12-20 NOTE — Plan of Care (Signed)
?  Problem: Cardiovascular: ?Goal: Ability to achieve and maintain adequate cardiovascular perfusion will improve ?Outcome: Progressing ?Goal: Vascular access site(s) Level 0-1 will be maintained ?Outcome: Progressing ?  ?Problem: Education: ?Goal: Knowledge of General Education information will improve ?Description: Including pain rating scale, medication(s)/side effects and non-pharmacologic comfort measures ?Outcome: Progressing ?  ?Problem: Health Behavior/Discharge Planning: ?Goal: Ability to manage health-related needs will improve ?Outcome: Progressing ?  ?Problem: Pain Managment: ?Goal: General experience of comfort will improve ?Outcome: Progressing ?  ?Problem: Safety: ?Goal: Ability to remain free from injury will improve ?Outcome: Progressing ?  ?

## 2021-12-20 NOTE — ED Notes (Signed)
Patient reports chest pressure persists. Provider aware.  ?

## 2021-12-20 NOTE — Interval H&P Note (Signed)
History and Physical Interval Note: ? ?12/20/2021 ?4:44 PM ? ?Carol [name]  has presented today for surgery, with the diagnosis of nstemi.  The various methods of treatment have been discussed with the patient and family. After consideration of risks, benefits and other options for treatment, the patient has consented to  Procedure(s): ?LEFT HEART CATH AND CORONARY ANGIOGRAPHY (N/A) as a surgical intervention.  The patient's history has been reviewed, patient examined, no change in status, stable for surgery.  I have reviewed the patient's chart and labs.  Questions were answered to the patient's satisfaction.   ? ?Cath Lab Visit (complete for each Cath Lab visit) ? ?Clinical Evaluation Leading to the Procedure:  ? ?ACS: Yes.   ? ?Non-ACS:   ? ?Anginal Classification: CCS III ? ?Anti-ischemic medical therapy: Maximal Therapy (2 or more classes of medications) ? ?Non-Invasive Test Results: No non-invasive testing performed ? ?Prior CABG: No previous CABG ? ? ? ? ? ? ? ?Carol [name] ? ? ?

## 2021-12-20 NOTE — Progress Notes (Addendum)
ANTICOAGULATION CONSULT NOTE - Initial Consult ? ?Pharmacy Consult for heparin ?Indication: chest pain/ACS ? ?Allergies  ?Allergen Reactions  ? Penicillins Shortness Of Breath and Other (See Comments)  ?  Caused yeast infection ?Has patient had a PCN reaction causing immediate rash, facial/tongue/throat swelling, SOB or lightheadedness with hypotension: Yes ?Has patient had a PCN reaction causing severe rash involving mucus membranes or skin necrosis: No ?Has patient had a PCN reaction that required hospitalization pt was in the hospital at the time of the reaction ?Has patient had a PCN reaction occurring within the last 10 years: No ?If all of the above answers are "NO", then may proceed with Cephalosp  ? Tramadol Other (See Comments)  ?  unknown  ? ? ?Patient Measurements: ?Height: 5\' 4"  (162.6 cm) ?Weight: 77.1 kg (170 lb) ?IBW/kg (Calculated) : 54.7 ?Heparin Dosing Weight: 70kg ? ?Vital Signs: ?Temp: 97.9 ?F (36.6 ?C) (05/03 0355) ?Temp Source: Oral (05/03 0355) ?BP: 162/95 (05/03 0355) ?Pulse Rate: 71 (05/03 0355) ? ?Labs: ?Recent Labs  ?  12/20/21 ?0351  ?HGB 11.6*  ?HCT 35.7*  ?PLT 198  ?APTT 25  ?LABPROT 12.5  ?INR 0.9  ? ? ?Medical History: ?Past Medical History:  ?Diagnosis Date  ? Abnormal liver function tests   ? Acute kidney injury ([place]) 01/2014  ? Hospitalized, Volume depletion, nausea and vomiting  ? Anxiety   ? 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 Chronic back pain   ? Depression   ? Diabetes mellitus   ? GERD (gastroesophageal reflux disease)   ? Heart murmur   ? Hypertension   ? Palpitations   ? Parkinson's disease ([place])   ? ? ?Assessment: ?60yo female c/o chest pressure that awoke her from sleep, concern for ACS, to begin heparin. ? ?Goal of Therapy:  ?Heparin level 0.3-0.7 units/ml ?Monitor platelets by anticoagulation protocol: Yes ?  ?Plan:  ?Heparin 4000 units IV bolus x1 followed by infusion at 900 units/hr and monitor heparin levels and CBC. ? ?Wynona Neat, PharmD, BCPS   ?12/20/2021,4:43 AM ? ? ?

## 2021-12-20 NOTE — ED Notes (Signed)
Patient to ER room 29 via EMS. Patient reports severe chest pressure that woke her from sleeping this morning. Patient denies cardiac history aside from hypertension and hyperlipidemia. Patient is alert and oriented and calm. ER physician at bedside and reports patient is not an active stemi.  ?

## 2021-12-20 NOTE — Progress Notes (Addendum)
ANTICOAGULATION CONSULT NOTE ? ?Pharmacy Consult for heparin ?Indication: chest pain/ACS ? ?Patient Measurements: ?Height: 5\' 4"  (162.6 cm) ?Weight: 77.1 kg (170 lb) ?IBW/kg (Calculated) : 54.7 ?Heparin Dosing Weight: 70kg ? ?Vital Signs: ?Temp: 97.9 ?F (36.6 ?C) (05/03 0355) ?Temp Source: Oral (05/03 0355) ?BP: 117/73 (05/03 1300) ?Pulse Rate: 54 (05/03 1300) ? ?Labs: ?Recent Labs  ?  12/20/21 ?0351 12/20/21 ?1031 12/20/21 ?1414  ?HGB 11.6*  --   --   ?HCT 35.7*  --   --   ?PLT 198  --   --   ?APTT 25  --   --   ?LABPROT 12.5  --   --   ?INR 0.9  --   --   ?HEPARINUNFRC  --   --  0.43  ?CREATININE 1.72*  --   --   ?TROPONINIHS 1,895* 8,013*  --   ? ? ?Assessment: ?61yo female c/o chest pressure that awoke her from sleep, concern for ACS, to begin heparin. Initial heparin level is therapeutic at 0.43. No bleeding noted. Planning cardiac cath.  ? ?Goal of Therapy:  ?Heparin level 0.3-0.7 units/ml ?Monitor platelets by anticoagulation protocol: Yes ?  ?Plan:  ?Continue heparin gtt 900 units/hr ?Daily heparin level and CBC ?F/u cath plans ? ?Salome Arnt, PharmD, BCPS ?Clinical Pharmacist ?Please see AMION for all pharmacy numbers ?12/20/2021 3:03 PM ? ? ? ?

## 2021-12-20 NOTE — Progress Notes (Signed)
?  Echocardiogram ?2D Echocardiogram has been performed. ? ?Carol [name] ?12/20/2021, 3:41 PM ?

## 2021-12-20 NOTE — H&P (View-Only) (Signed)
Attending Addendum ? ?Carol [name] is a 23F with diabetes, CKD 3,?sarcoidosis, and uncontrolled hypertension admitted with NSTEMI.  She has struggled with her blood pressure lately despite taking her medicines regularly.  Yesterday she had chest pressure throughout the day.  She then awoke with chest pain and shortness of breath.  Symptoms were exertional.  She is also had some intermittent lower extremity edema.  High-sensitivity troponin is elevated to 1,895-->8,013.  EKG with inferior lateral T wave inversions new compared with 11/28/21.  She was started on a nitroglycerin drip as well as a heparin drip.  Creatinine was 1.72 compared with 1.693 weeks ago.  It has been as low as 1.4 1 year ago.  Overall, her findings are concerning for NSTEMI.  We did discuss the risk of nephrotoxicity with cardiac catheterization.  However given her chest pain, elevated troponins, and EKG changes, I do think it is prudent that we proceed with cardiac catheterization today and she is in agreement.  We will give very gentle IV fluids as well as order an echocardiogram.  We will hold her home HCTZ and ARB until after her cath.  Given that we are holding these, we will add hydralazine.  Continue amlodipine and nitroglycerin drip. ? ?Shared Decision Making/Informed Consent ?The risks [stroke (1 in 1000), death (1 in 57), kidney failure [usually temporary] (1 in 500), bleeding (1 in 200), allergic reaction [possibly serious] (1 in 200)], benefits (diagnostic support and management of coronary artery disease) and alternatives of a cardiac catheterization were discussed in detail with Carol [name] and she is willing to proceed. ? ?Ankit Degregorio C. Oval Linsey, MD, [facility] ?12/20/2021 ?10:05 AM ? ?

## 2021-12-20 NOTE — ED Notes (Signed)
Spoke with cardiology regarding patient second troponin.  ?

## 2021-12-21 ENCOUNTER — Encounter (HOSPITAL_COMMUNITY): Payer: Self-pay | Admitting: Cardiovascular Disease

## 2021-12-21 DIAGNOSIS — I10 Essential (primary) hypertension: Secondary | ICD-10-CM

## 2021-12-21 DIAGNOSIS — E7800 Pure hypercholesterolemia, unspecified: Secondary | ICD-10-CM

## 2021-12-21 LAB — BASIC METABOLIC PANEL WITH GFR
Anion gap: 7 (ref 5–15)
BUN: 16 mg/dL (ref 6–20)
CO2: 20 mmol/L — ABNORMAL LOW (ref 22–32)
Calcium: 8.3 mg/dL — ABNORMAL LOW (ref 8.9–10.3)
Chloride: 110 mmol/L (ref 98–111)
Creatinine, Ser: 1.93 mg/dL — ABNORMAL HIGH (ref 0.44–1.00)
GFR, Estimated: 29 mL/min — ABNORMAL LOW
Glucose, Bld: 141 mg/dL — ABNORMAL HIGH (ref 70–99)
Potassium: 3.9 mmol/L (ref 3.5–5.1)
Sodium: 137 mmol/L (ref 135–145)

## 2021-12-21 LAB — GLUCOSE, CAPILLARY
Glucose-Capillary: 185 mg/dL — ABNORMAL HIGH (ref 70–99)
Glucose-Capillary: 298 mg/dL — ABNORMAL HIGH (ref 70–99)
Glucose-Capillary: 195 mg/dL — ABNORMAL HIGH (ref 70–99)
Glucose-Capillary: 210 mg/dL — ABNORMAL HIGH (ref 70–99)

## 2021-12-21 MED ORDER — SODIUM CHLORIDE 0.9 % IV SOLN: INTRAVENOUS | Status: DC

## 2021-12-21 MED ORDER — OXYCODONE-ACETAMINOPHEN 5-325 MG PO TABS
1.0000 | ORAL_TABLET | Freq: Four times a day (QID) | ORAL | Status: DC | PRN
  Administered 2021-12-21 (×2): 2 via ORAL
  Administered 2021-12-22: 1 via ORAL
  Administered 2021-12-23 – 2021-12-24 (×2): 2 via ORAL
  Filled 2021-12-21: qty 2
  Filled 2021-12-21: qty 1
  Filled 2021-12-21 (×3): qty 2

## 2021-12-21 MED ORDER — ALUM & MAG HYDROXIDE-SIMETH 200-200-20 MG/5ML PO SUSP
30.0000 mL | ORAL | Status: DC | PRN
  Administered 2021-12-21 – 2021-12-24 (×3): 30 mL via ORAL
  Filled 2021-12-21 (×3): qty 30

## 2021-12-21 MED ORDER — CLOPIDOGREL BISULFATE 300 MG PO TABS
600.0000 mg | ORAL_TABLET | Freq: Once | ORAL | Status: AC
Start: 2021-12-21 — End: 2021-12-21
  Administered 2021-12-21: 600 mg via ORAL
  Filled 2021-12-21: qty 2

## 2021-12-21 MED ORDER — ISOSORBIDE MONONITRATE ER 60 MG PO TB24
60.0000 mg | ORAL_TABLET | Freq: Every day | ORAL | Status: DC
  Administered 2021-12-21: 60 mg via ORAL
  Filled 2021-12-21 (×2): qty 1

## 2021-12-21 MED ORDER — HYDRALAZINE HCL 50 MG PO TABS
100.0000 mg | ORAL_TABLET | Freq: Three times a day (TID) | ORAL | Status: DC
  Administered 2021-12-21 – 2021-12-22 (×3): 100 mg via ORAL
  Filled 2021-12-21 (×3): qty 2

## 2021-12-21 NOTE — Progress Notes (Signed)
?  Transition of Care (TOC) Screening Note ? ? ?Patient Details  ?Name: Carol [name] ?Date of Birth: 1961-04-26 ? ? ?Transition of Care (TOC) CM/SW Contact:    ?Milas Gain, LCSWA ?Phone Number: ?12/21/2021, 4:24 PM ? ? ? ?Transition of Care Department [facility]) has reviewed patient and no TOC needs have been identified at this time. We will continue to monitor patient advancement through interdisciplinary progression rounds. If new patient transition needs arise, please place a TOC consult. ?  ?

## 2021-12-21 NOTE — Plan of Care (Signed)
?  Problem: Cardiovascular: ?Goal: Ability to achieve and maintain adequate cardiovascular perfusion will improve ?Outcome: Progressing ?  ?Problem: Cardiovascular: ?Goal: Vascular access site(s) Level 0-1 will be maintained ?Outcome: Progressing ?  ?Problem: Education: ?Goal: Knowledge of General Education information will improve ?Description: Including pain rating scale, medication(s)/side effects and non-pharmacologic comfort measures ?Outcome: Progressing ?  ?Problem: Clinical Measurements: ?Goal: Cardiovascular complication will be avoided ?Outcome: Progressing ?  ?Problem: Activity: ?Goal: Risk for activity intolerance will decrease ?Outcome: Progressing ?  ?Problem: Nutrition: ?Goal: Adequate nutrition will be maintained ?Outcome: Progressing ?  ?Problem: Pain Managment: ?Goal: General experience of comfort will improve ?Outcome: Progressing ?  ?Problem: Safety: ?Goal: Ability to remain free from injury will improve ?Outcome: Progressing ?  ?

## 2021-12-21 NOTE — Progress Notes (Signed)
? ?Progress Note ? ?Patient Name: Carol [name] ?Date of Encounter: 12/21/2021 ? ?[facility] Cardiologist: Janina Mayo, MD  ? ?Subjective  ? ?No CP overnight, sleepy, has not been OOB ? ?Inpatient Medications  ?  ?Scheduled Meds: ? amLODipine  10 mg Oral Daily  ? aspirin EC  81 mg Oral Daily  ? atorvastatin  80 mg Oral Daily  ? clopidogrel  600 mg Oral Once  ? clopidogrel  75 mg Oral Daily  ? hydrALAZINE  50 mg Oral Q8H  ? insulin aspart  0-20 Units Subcutaneous TID WC  ? insulin aspart  0-5 Units Subcutaneous QHS  ? insulin glargine-yfgn  10 Units Subcutaneous QHS  ? nebivolol  20 mg Oral Daily  ? QUEtiapine  300 mg Oral QHS  ? sertraline  50 mg Oral Daily  ? sodium chloride flush  3 mL Intravenous Q12H  ? ?Continuous Infusions: ? sodium chloride 50 mL/hr at 12/20/21 1015  ? sodium chloride    ? nitroGLYCERIN Stopped (12/20/21 1711)  ? ?PRN Meds: ?sodium chloride, acetaminophen, ALPRAZolam, hydrOXYzine, metoprolol tartrate, nitroGLYCERIN, ondansetron (ZOFRAN) IV, sodium chloride flush  ? ?Vital Signs  ?  ?Vitals:  ? 12/20/21 2000 12/20/21 2119 12/21/21 0020 12/21/21 0350  ?BP: (!) 173/86 128/65 131/69 (!) 151/80  ?Pulse: 63 70 60 62  ?Resp: 18  19 20   ?Temp: 98.7 ?F (37.1 ?C)  98 ?F (36.7 ?C) 97.7 ?F (36.5 ?C)  ?TempSrc: Oral  Oral Oral  ?SpO2: 98%  99% 99%  ?Weight: 90.3 kg   90.3 kg  ?Height: 5\' 4"  (1.626 m)     ? ? ?Intake/Output Summary (Last 24 hours) at 12/21/2021 0723 ?Last data filed at 12/20/2021 2200 ?Gross per 24 hour  ?Intake 335.03 ml  ?Output 550 ml  ?Net -214.97 ml  ? ? ?  12/21/2021  ?  3:50 AM 12/20/2021  ?  8:00 PM 12/20/2021  ?  3:56 AM  ?Last 3 Weights  ?Weight (lbs) 199 lb 199 lb 1.6 oz 170 lb  ?Weight (kg) 90.266 kg 90.311 kg 77.111 kg  ?   ? ?Telemetry  ?  ?SR, PVCs, HR low-nl while asleep - Personally Reviewed ? ?ECG  ?  ?None today - Personally Reviewed ? ?Physical Exam  ? ?GEN: No acute distress.   ?Neck: No JVD ?Cardiac: RRR, no murmurs, rubs, or gallops.  ?Respiratory: Clear to  auscultation bilaterally. ?GI: Soft, nontender, non-distended  ?MS: No edema; No deformity. R radial cath site w/out ecchymosis or hematoma ?Neuro:  Nonfocal  ?Psych: Normal affect  ? ?Labs  ?  ?High Sensitivity Troponin:   ?Recent Labs  ?Lab 11/26/21 ?1140 11/26/21 ?1340 12/20/21 ?0351 12/20/21 ?0160  ?TROPONINIHS 44* 48* 1,895* 8,013*  ?   ?Chemistry ?Recent Labs  ?Lab 12/20/21 ?0351 12/21/21 ?1093  ?NA 138 137  ?K 3.6 3.9  ?CL 111 110  ?CO2 20* 20*  ?GLUCOSE 211* 141*  ?BUN 13 16  ?CREATININE 1.72* 1.93*  ?CALCIUM 8.2* 8.3*  ?PROT 5.5*  --   ?ALBUMIN 2.1*  --   ?AST 43*  --   ?ALT 13  --   ?ALKPHOS 104  --   ?BILITOT 0.4  --   ?GFRNONAA 34* 29*  ?ANIONGAP 7 7  ?  ?Lipids  ?Recent Labs  ?Lab 12/20/21 ?0351  ?CHOL 268*  ?TRIG 183*  ?HDL 57  ?[place] 174*  ?CHOLHDL 4.7  ?  ?Hematology ?Recent Labs  ?Lab 12/20/21 ?0351  ?WBC 4.4  ?RBC 3.83*  ?HGB 11.6*  ?  HCT 35.7*  ?MCV 93.2  ?MCH 30.3  ?MCHC 32.5  ?RDW 14.0  ?PLT 198  ? ?Thyroid No results for input(s): TSH, FREET4 in the last 168 hours.  ?BNP ?Recent Labs  ?Lab 12/20/21 ?1275  ?BNP 202.9*  ?  ?Lab Results  ?Component Value Date  ? HGBA1C 6.4 (H) 12/20/2021  ? ? ? ? ?Radiology  ?  ?CARDIAC CATHETERIZATION ? ?Result Date: 12/20/2021 ?  Inf Sept lesion is 100% stenosed.   Mid RCA lesion is 20% stenosed.   Mid LAD lesion is 20% stenosed. NSTEMI with culprit vessel felt to be a very small sub-branch off of the right PDA. This is a 1.5 mm vessel and supplies a small portion of myocardium. Mild disease in the mid LAD and mid RCA LVEDP=15 mmHg Recommendations: Medical management of CAD. I will start Plavix and continue ASA, beta blocker and statin.  ? ?DG Chest Port 1 View ? ?Result Date: 12/20/2021 ?CLINICAL DATA:  STEMI and chest pain EXAM: PORTABLE CHEST 1 VIEW COMPARISON:  11/21/2021 FINDINGS: Mild cardiomegaly. Stable mediastinal contours including the right mediastinal convexity at the ascending aorta. Diffuse interstitial coarsening above prior baseline. Artifact from  EKG leads and defibrillator pads. IMPRESSION: Mild cardiomegaly with interstitial edema. Electronically Signed   By: Jorje Guild M.D.   On: 12/20/2021 04:10  ? ?ECHOCARDIOGRAM COMPLETE ? ?Result Date: 12/20/2021 ?   ECHOCARDIOGRAM REPORT   Patient Name:   Carol [name] Date of Exam: 12/20/2021 Medical Rec #:  [id number]               Height:       64.0 in Accession #:    [id number]              Weight:       170.0 lb Date of Birth:  12-09-1960               BSA:          1.826 m? Patient Age:    61 years                BP:           112/71 mmHg Patient Gender: F                       HR:           66 bpm. Exam Location:  Inpatient Procedure: 2D Echo, Intracardiac Opacification Agent, Color Doppler and Cardiac            Doppler Indications:    NSTEMI  History:        Patient has prior history of Echocardiogram examinations, most                 recent 01/30/2014. Risk Factors:Hypertension and Diabetes.  Sonographer:    Joette Catching RCS Referring Phys: [phone] CHRISTOPHER A [place]  1. Left ventricular ejection fraction, by estimation, is 60 to 65%. The left ventricle has normal function. The left ventricle has no regional wall motion abnormalities. There is mild left ventricular hypertrophy. Left ventricular diastolic parameters are consistent with Grade I diastolic dysfunction (impaired relaxation).  2. Right ventricular systolic function is normal. The right ventricular size is normal.  3. Left atrial size was mildly dilated.  4. The mitral valve is normal in structure. Trivial mitral valve regurgitation. No evidence of mitral stenosis.  5. The aortic valve is normal in structure. Aortic valve regurgitation is not visualized. No  aortic stenosis is present.  6. The inferior vena cava is normal in size with greater than 50% respiratory variability, suggesting right atrial pressure of 3 mmHg. FINDINGS  Left Ventricle: Left ventricular ejection fraction, by estimation, is 60 to 65%. The left  ventricle has normal function. The left ventricle has no regional wall motion abnormalities. Definity contrast agent was given IV to delineate the left ventricular  endocardial borders. The left ventricular internal cavity size was normal in size. There is mild left ventricular hypertrophy. Left ventricular diastolic parameters are consistent with Grade I diastolic dysfunction (impaired relaxation). Right Ventricle: The right ventricular size is normal. No increase in right ventricular wall thickness. Right ventricular systolic function is normal. Left Atrium: Left atrial size was mildly dilated. Right Atrium: Right atrial size was normal in size. Pericardium: There is no evidence of pericardial effusion. Mitral Valve: The mitral valve is normal in structure. Trivial mitral valve regurgitation. No evidence of mitral valve stenosis. Tricuspid Valve: The tricuspid valve is normal in structure. Tricuspid valve regurgitation is not demonstrated. No evidence of tricuspid stenosis. Aortic Valve: The aortic valve is normal in structure. Aortic valve regurgitation is not visualized. No aortic stenosis is present. Aortic valve mean gradient measures 5.0 mmHg. Aortic valve peak gradient measures 7.4 mmHg. Aortic valve area, by VTI measures 2.62 cm?. Pulmonic Valve: The pulmonic valve was normal in structure. Pulmonic valve regurgitation is not visualized. No evidence of pulmonic stenosis. Aorta: The aortic root is normal in size and structure. Venous: The inferior vena cava is normal in size with greater than 50% respiratory variability, suggesting right atrial pressure of 3 mmHg. IAS/Shunts: No atrial level shunt detected by color flow Doppler.  LEFT VENTRICLE PLAX 2D LVIDd:         4.30 cm   Diastology LVIDs:         3.00 cm   LV e' medial:    5.19 cm/s LV PW:         1.20 cm   LV E/e' medial:  20.8 LV IVS:        1.20 cm   LV e' lateral:   6.89 cm/s LVOT diam:     2.00 cm   LV E/e' lateral: 15.7 LV SV:         84 LV SV  Index:   46 LVOT Area:     3.14 cm?  RIGHT VENTRICLE             IVC RV Basal diam:  3.80 cm     IVC diam: 1.00 cm RV Mid diam:    3.20 cm RV S prime:     10.60 cm/s TAPSE (M-mode): 2.4 cm LEFT ATRIUM

## 2021-12-21 NOTE — Progress Notes (Signed)
CARDIAC REHAB PHASE I  ? ?PRE:  Rate/Rhythm: 70 SR ? ?  BP: sitting 183/87 ? ?  SaO2: 99 RA ? ?MODE:  Ambulation: 250 ft  ? ?POST:  Rate/Rhythm: 85 SR ? ?  BP: sitting 190/88  ? ?  SaO2:  ? ?When asked pt endorses mild chest pressure since 8 am. Able to walk with RW without any c/o. Later, when asked, did not remember if her chest pressure was better or worse. BP elevated.  ? ?To recliner. Discussed MI, Plavix, restrictions, diet, exercise, NTG and CRPII. Pt receptive. Encouraged more exercise, Will refer to [place].  ?[id number] ? ?Yves Dill CES, ACSM ?12/21/2021 ?12:13 PM ? ? ? ? ?

## 2021-12-22 DIAGNOSIS — N179 Acute kidney failure, unspecified: Secondary | ICD-10-CM

## 2021-12-22 DIAGNOSIS — N1832 Chronic kidney disease, stage 3b: Secondary | ICD-10-CM

## 2021-12-22 LAB — BASIC METABOLIC PANEL
Anion gap: 10 (ref 5–15)
BUN: 23 mg/dL — ABNORMAL HIGH (ref 6–20)
CO2: 18 mmol/L — ABNORMAL LOW (ref 22–32)
Calcium: 8.1 mg/dL — ABNORMAL LOW (ref 8.9–10.3)
Chloride: 108 mmol/L (ref 98–111)
Creatinine, Ser: 3.18 mg/dL — ABNORMAL HIGH (ref 0.44–1.00)
GFR, Estimated: 16 mL/min — ABNORMAL LOW (ref >60)
Glucose, Bld: 182 mg/dL — ABNORMAL HIGH (ref 70–99)
Potassium: 4 mmol/L (ref 3.5–5.1)
Sodium: 136 mmol/L (ref 135–145)

## 2021-12-22 LAB — LIPOPROTEIN A (LPA): Lipoprotein (a): 248.4 nmol/L — ABNORMAL HIGH (ref <75.0)

## 2021-12-22 LAB — GLUCOSE, CAPILLARY
Glucose-Capillary: 143 mg/dL — ABNORMAL HIGH (ref 70–99)
Glucose-Capillary: 142 mg/dL — ABNORMAL HIGH (ref 70–99)
Glucose-Capillary: 131 mg/dL — ABNORMAL HIGH (ref 70–99)
Glucose-Capillary: 142 mg/dL — ABNORMAL HIGH (ref 70–99)

## 2021-12-22 MED ORDER — HYDRALAZINE HCL 25 MG PO TABS
25.0000 mg | ORAL_TABLET | Freq: Three times a day (TID) | ORAL | Status: DC
  Administered 2021-12-22 – 2021-12-24 (×7): 25 mg via ORAL
  Filled 2021-12-22 (×7): qty 1

## 2021-12-22 MED ORDER — ISOSORBIDE MONONITRATE ER 30 MG PO TB24
30.0000 mg | ORAL_TABLET | Freq: Every day | ORAL | Status: DC
  Administered 2021-12-23 – 2021-12-24 (×2): 30 mg via ORAL
  Filled 2021-12-22 (×2): qty 1

## 2021-12-22 NOTE — Progress Notes (Signed)
? ?Progress Note ? ?Patient Name: Carol [name] ?Date of Encounter: 12/22/2021 ? ?[facility] Cardiologist: Janina Mayo, MD  ? ?Subjective  ? ?No CP overnight. Has back pain. BP lower than usual for her ? ?Inpatient Medications  ?  ?Scheduled Meds: ? amLODipine  10 mg Oral Daily  ? aspirin EC  81 mg Oral Daily  ? atorvastatin  80 mg Oral Daily  ? clopidogrel  75 mg Oral Daily  ? hydrALAZINE  100 mg Oral Q8H  ? insulin aspart  0-20 Units Subcutaneous TID WC  ? insulin aspart  0-5 Units Subcutaneous QHS  ? insulin glargine-yfgn  10 Units Subcutaneous QHS  ? isosorbide mononitrate  60 mg Oral Daily  ? nebivolol  20 mg Oral Daily  ? QUEtiapine  300 mg Oral QHS  ? sertraline  50 mg Oral Daily  ? sodium chloride flush  3 mL Intravenous Q12H  ? ?Continuous Infusions: ? sodium chloride    ? sodium chloride 50 mL/hr at 12/22/21 0500  ? ?PRN Meds: ?sodium chloride, acetaminophen, ALPRAZolam, alum & mag hydroxide-simeth, hydrOXYzine, metoprolol tartrate, nitroGLYCERIN, ondansetron (ZOFRAN) IV, oxyCODONE-acetaminophen, sodium chloride flush  ? ?Vital Signs  ?  ?Vitals:  ? 12/21/21 1000 12/21/21 1935 12/22/21 0025 12/22/21 0522  ?BP: (!) 156/76 103/78 (!) 90/52 (!) 106/58  ?Pulse:  66 (!) 57 60  ?Resp:  18 19 19   ?Temp:  97.9 ?F (36.6 ?C) 98.1 ?F (36.7 ?C) 98.1 ?F (36.7 ?C)  ?TempSrc:  Oral Oral Oral  ?SpO2:  99% 98% 98%  ?Weight:      ?Height:      ? ? ?Intake/Output Summary (Last 24 hours) at 12/22/2021 0751 ?Last data filed at 12/22/2021 0500 ?Gross per 24 hour  ?Intake 1237.23 ml  ?Output 350 ml  ?Net 887.23 ml  ? ? ?  12/21/2021  ?  3:50 AM 12/20/2021  ?  8:00 PM 12/20/2021  ?  3:56 AM  ?Last 3 Weights  ?Weight (lbs) 199 lb 199 lb 1.6 oz 170 lb  ?Weight (kg) 90.266 kg 90.311 kg 77.111 kg  ?   ? ?Telemetry  ?  ?SR, PVCs- Personally Reviewed ? ?ECG  ?  ?None today - Personally Reviewed ? ?Physical Exam  ? ?General: Well developed, well nourished, female in no acute distress ?Head: Eyes PERRLA, Head normocephalic and  atraumatic ?Lungs: few rhonchi on L, mild rales bilaterally to auscultation. ?Heart: HRRR S1 S2, without rub or gallop. soft murmur. 4/4 extremity pulses are 2+ & equal.  JVD 8-9 cm ?Abdomen: Bowel sounds are present, abdomen soft and non-tender without masses or  hernias noted. ?Msk: Normal strength and tone for age. ?Extremities: No clubbing, cyanosis or edema.    ?Skin:  No rashes or lesions noted. ?Neuro: Alert and oriented X 3. ?Psych:  Good affect, responds appropriately  ? ?Labs  ?  ?High Sensitivity Troponin:   ?Recent Labs  ?Lab 11/26/21 ?1140 11/26/21 ?1340 12/20/21 ?0351 12/20/21 ?1610  ?TROPONINIHS 44* 48* 1,895* 8,013*  ?   ?Chemistry ?Recent Labs  ?Lab 12/20/21 ?0351 12/21/21 ?9604  ?NA 138 137  ?K 3.6 3.9  ?CL 111 110  ?CO2 20* 20*  ?GLUCOSE 211* 141*  ?BUN 13 16  ?CREATININE 1.72* 1.93*  ?CALCIUM 8.2* 8.3*  ?PROT 5.5*  --   ?ALBUMIN 2.1*  --   ?AST 43*  --   ?ALT 13  --   ?ALKPHOS 104  --   ?BILITOT 0.4  --   ?GFRNONAA 34* 29*  ?ANIONGAP  7 7  ?  ?Lipids  ?Recent Labs  ?Lab 12/20/21 ?0351  ?CHOL 268*  ?TRIG 183*  ?HDL 57  ?[place] 174*  ?CHOLHDL 4.7  ?  ?Hematology ?Recent Labs  ?Lab 12/20/21 ?0351  ?WBC 4.4  ?RBC 3.83*  ?HGB 11.6*  ?HCT 35.7*  ?MCV 93.2  ?MCH 30.3  ?MCHC 32.5  ?RDW 14.0  ?PLT 198  ? ?Thyroid No results for input(s): TSH, FREET4 in the last 168 hours.  ?BNP ?Recent Labs  ?Lab 12/20/21 ?6073  ?BNP 202.9*  ?  ?Lab Results  ?Component Value Date  ? HGBA1C 6.4 (H) 12/20/2021  ? ? ? ? ?Radiology  ?  ?CARDIAC CATHETERIZATION ? ?Result Date: 12/20/2021 ?  Inf Sept lesion is 100% stenosed.   Mid RCA lesion is 20% stenosed.   Mid LAD lesion is 20% stenosed. NSTEMI with culprit vessel felt to be a very small sub-branch off of the right PDA. This is a 1.5 mm vessel and supplies a small portion of myocardium. Mild disease in the mid LAD and mid RCA LVEDP=15 mmHg Recommendations: Medical management of CAD. I will start Plavix and continue ASA, beta blocker and statin.  ? ?ECHOCARDIOGRAM  COMPLETE ? ?Result Date: 12/20/2021 ?   ECHOCARDIOGRAM REPORT   Patient Name:   Carol [name] Date of Exam: 12/20/2021 Medical Rec #:  [id number]               Height:       64.0 in Accession #:    [id number]              Weight:       170.0 lb Date of Birth:  [birth year]               BSA:          1.826 m? Patient Age:    61 years                BP:           112/71 mmHg Patient Gender: F                       HR:           66 bpm. Exam Location:  Inpatient Procedure: 2D Echo, Intracardiac Opacification Agent, Color Doppler and Cardiac            Doppler Indications:    NSTEMI  History:        Patient has prior history of Echocardiogram examinations, most                 recent 01/30/2014. Risk Factors:Hypertension and Diabetes.  Sonographer:    Joette Catching RCS Referring Phys: [phone] CHRISTOPHER A [place]  1. Left ventricular ejection fraction, by estimation, is 60 to 65%. The left ventricle has normal function. The left ventricle has no regional wall motion abnormalities. There is mild left ventricular hypertrophy. Left ventricular diastolic parameters are consistent with Grade I diastolic dysfunction (impaired relaxation).  2. Right ventricular systolic function is normal. The right ventricular size is normal.  3. Left atrial size was mildly dilated.  4. The mitral valve is normal in structure. Trivial mitral valve regurgitation. No evidence of mitral stenosis.  5. The aortic valve is normal in structure. Aortic valve regurgitation is not visualized. No aortic stenosis is present.  6. The inferior vena cava is normal in size with greater than 50% respiratory variability, suggesting right atrial pressure of 3  mmHg. FINDINGS  Left Ventricle: Left ventricular ejection fraction, by estimation, is 60 to 65%. The left ventricle has normal function. The left ventricle has no regional wall motion abnormalities. Definity contrast agent was given IV to delineate the left ventricular  endocardial borders.  The left ventricular internal cavity size was normal in size. There is mild left ventricular hypertrophy. Left ventricular diastolic parameters are consistent with Grade I diastolic dysfunction (impaired relaxation). Right Ventricle: The right ventricular size is normal. No increase in right ventricular wall thickness. Right ventricular systolic function is normal. Left Atrium: Left atrial size was mildly dilated. Right Atrium: Right atrial size was normal in size. Pericardium: There is no evidence of pericardial effusion. Mitral Valve: The mitral valve is normal in structure. Trivial mitral valve regurgitation. No evidence of mitral valve stenosis. Tricuspid Valve: The tricuspid valve is normal in structure. Tricuspid valve regurgitation is not demonstrated. No evidence of tricuspid stenosis. Aortic Valve: The aortic valve is normal in structure. Aortic valve regurgitation is not visualized. No aortic stenosis is present. Aortic valve mean gradient measures 5.0 mmHg. Aortic valve peak gradient measures 7.4 mmHg. Aortic valve area, by VTI measures 2.62 cm?. Pulmonic Valve: The pulmonic valve was normal in structure. Pulmonic valve regurgitation is not visualized. No evidence of pulmonic stenosis. Aorta: The aortic root is normal in size and structure. Venous: The inferior vena cava is normal in size with greater than 50% respiratory variability, suggesting right atrial pressure of 3 mmHg. IAS/Shunts: No atrial level shunt detected by color flow Doppler.  LEFT VENTRICLE PLAX 2D LVIDd:         4.30 cm   Diastology LVIDs:         3.00 cm   LV e' medial:    5.19 cm/s LV PW:         1.20 cm   LV E/e' medial:  20.8 LV IVS:        1.20 cm   LV e' lateral:   6.89 cm/s LVOT diam:     2.00 cm   LV E/e' lateral: 15.7 LV SV:         84 LV SV Index:   46 LVOT Area:     3.14 cm?  RIGHT VENTRICLE             IVC RV Basal diam:  3.80 cm     IVC diam: 1.00 cm RV Mid diam:    3.20 cm RV S prime:     10.60 cm/s TAPSE (M-mode): 2.4 cm  LEFT ATRIUM             Index LA diam:        3.60 cm 1.97 cm/m? LA Vol (A2C):   72.9 ml 39.93 ml/m? LA Vol (A4C):   46.3 ml 25.36 ml/m? LA Biplane Vol: 62.8 ml 34.40 ml/m?  AORTIC VALVE                     PULMONIC

## 2021-12-22 NOTE — Progress Notes (Signed)
CARDIAC REHAB PHASE I  ? ?PRE:  Rate/Rhythm: 65 SR ? ?  BP: lying 88/50 ? ?  SaO2: 98 RA ? ?MODE:  Ambulation: 165 ft  ? ?POST:  Rate/Rhythm: 75 SR ? ?  BP: sitting 122/70  ? ?  SaO2:  ? ?Pt BP low but asx. Able to walk with RW, no dizziness. Return to bed. Denied CP but does st she feels SOB and feet feel puffy. ?[id number]  ? ?Carol [name] CES, ACSM ?12/22/2021 ?2:30 PM ? ? ? ? ?

## 2021-12-22 NOTE — Care Management Important Message (Signed)
Important Message ? ?Patient Details  ?Name: Carol [name] ?MRN: [id number] ?Date of Birth: [birth year] ? ? ?Medicare Important Message Given:  Yes ? ? ? ? ?Shelda Altes ?12/22/2021, 10:57 AM ?

## 2021-12-23 LAB — BASIC METABOLIC PANEL
Anion gap: 6 (ref 5–15)
BUN: 25 mg/dL — ABNORMAL HIGH (ref 6–20)
CO2: 17 mmol/L — ABNORMAL LOW (ref 22–32)
Calcium: 8.1 mg/dL — ABNORMAL LOW (ref 8.9–10.3)
Chloride: 111 mmol/L (ref 98–111)
Creatinine, Ser: 3.19 mg/dL — ABNORMAL HIGH (ref 0.44–1.00)
GFR, Estimated: 16 mL/min — ABNORMAL LOW (ref >60)
Glucose, Bld: 98 mg/dL (ref 70–99)
Potassium: 4.2 mmol/L (ref 3.5–5.1)
Sodium: 134 mmol/L — ABNORMAL LOW (ref 135–145)

## 2021-12-23 LAB — GLUCOSE, CAPILLARY
Glucose-Capillary: 110 mg/dL — ABNORMAL HIGH (ref 70–99)
Glucose-Capillary: 189 mg/dL — ABNORMAL HIGH (ref 70–99)
Glucose-Capillary: 193 mg/dL — ABNORMAL HIGH (ref 70–99)
Glucose-Capillary: 190 mg/dL — ABNORMAL HIGH (ref 70–99)

## 2021-12-23 NOTE — Progress Notes (Signed)
Per review with Dr. Harl Bowie, will plan to recheck BMET in AM and follow up creatinine tomorrow to determine dispo and follow-up plan for dc. ? ?

## 2021-12-23 NOTE — Progress Notes (Signed)
CARDIAC REHAB PHASE I  ? ?PRE:  Rate/Rhythm: 70 SR ? ?BP:  Sitting: 134/69  ?  ?  SaO2: 99 RA ? ?MODE:  Ambulation: 200 ft  ? ?POST:  Rate/Rhythm: 78 SR ? ?BP:  Sitting: 139/75   ? ?  SaO2: 98 RA ? ?Pt tolerated exercise well and amb 200 ft with  walker , and stand-by assist. Pt denies CP, SOB, or dizziness throughout walk. Pt took 1 rest rest break during walk. Pt back to bed with call bell in reach. Will continue to follow. ? ?[id number] ?Sheppard Plumber, MS, ACSM-CEP ?12/23/2021 ?12:00 PM ? ?   ?

## 2021-12-23 NOTE — Progress Notes (Signed)
? ?Progress Note ? ?Patient Name: Carol [name] ?Date of Encounter: 12/23/2021 ? ?[facility] Cardiologist: Janina Mayo, MD  ? ?Subjective  ? ?No chest pain. Bps improved.  Crt 1.9->3.18->3.19 ? ?Inpatient Medications  ?  ?Scheduled Meds: ? amLODipine  10 mg Oral Daily  ? aspirin EC  81 mg Oral Daily  ? atorvastatin  80 mg Oral Daily  ? clopidogrel  75 mg Oral Daily  ? hydrALAZINE  25 mg Oral Q8H  ? insulin aspart  0-20 Units Subcutaneous TID WC  ? insulin aspart  0-5 Units Subcutaneous QHS  ? insulin glargine-yfgn  10 Units Subcutaneous QHS  ? isosorbide mononitrate  30 mg Oral Daily  ? nebivolol  20 mg Oral Daily  ? QUEtiapine  300 mg Oral QHS  ? sertraline  50 mg Oral Daily  ? sodium chloride flush  3 mL Intravenous Q12H  ? ?Continuous Infusions: ? sodium chloride    ? sodium chloride 50 mL/hr at 12/22/21 2100  ? ?PRN Meds: ?sodium chloride, acetaminophen, ALPRAZolam, alum & mag hydroxide-simeth, hydrOXYzine, metoprolol tartrate, nitroGLYCERIN, ondansetron (ZOFRAN) IV, oxyCODONE-acetaminophen, sodium chloride flush  ? ?Vital Signs  ?  ?Vitals:  ? 12/22/21 1423 12/22/21 1437 12/22/21 2046 12/23/21 0456  ?BP: (!) 88/50 122/70 133/66 111/62  ?Pulse: 65   63  ?Resp:   18 18  ?Temp:   99 ?F (37.2 ?C) 98.6 ?F (37 ?C)  ?TempSrc:   Oral Oral  ?SpO2: 99% 98%  98%  ?Weight:      ?Height:      ? ? ?Intake/Output Summary (Last 24 hours) at 12/23/2021 1051 ?Last data filed at 12/23/2021 0535 ?Gross per 24 hour  ?Intake 1202.01 ml  ?Output 100 ml  ?Net 1102.01 ml  ? ? ?  12/21/2021  ?  3:50 AM 12/20/2021  ?  8:00 PM 12/20/2021  ?  3:56 AM  ?Last 3 Weights  ?Weight (lbs) 199 lb 199 lb 1.6 oz 170 lb  ?Weight (kg) 90.266 kg 90.311 kg 77.111 kg  ?   ? ?Telemetry  ?  ?SR,- Personally Reviewed ? ?ECG  ?  ?None today - Personally Reviewed ? ?Physical Exam  ? ?Physical Exam ?Gen: well appearing ?Neuro: alert and oriented ?CV: r,r,r no murmurs. No JVD ?Vasc: 2+ radial pulses. Radial site clean and dry ?Pulm: nl wob, CLAB ?Abd: non  distended ?Ext: No LE edema ?Skin: warm and well perfused ?Psych: normal mood ? ? ?Labs  ?  ?High Sensitivity Troponin:   ?Recent Labs  ?Lab 11/26/21 ?1140 11/26/21 ?1340 12/20/21 ?0351 12/20/21 ?9629  ?TROPONINIHS 44* 48* 1,895* 8,013*  ?   ?Chemistry ?Recent Labs  ?Lab 12/20/21 ?0351 12/21/21 ?5284 12/22/21 ?1324 12/23/21 ?0409  ?NA 138 137 136 134*  ?K 3.6 3.9 4.0 4.2  ?CL 111 110 108 111  ?CO2 20* 20* 18* 17*  ?GLUCOSE 211* 141* 182* 98  ?BUN 13 16 23* 25*  ?CREATININE 1.72* 1.93* 3.18* 3.19*  ?CALCIUM 8.2* 8.3* 8.1* 8.1*  ?PROT 5.5*  --   --   --   ?ALBUMIN 2.1*  --   --   --   ?AST 43*  --   --   --   ?ALT 13  --   --   --   ?ALKPHOS 104  --   --   --   ?BILITOT 0.4  --   --   --   ?GFRNONAA 34* 29* 16* 16*  ?ANIONGAP 7 7 10 6   ?  ?Lipids  ?  Recent Labs  ?Lab 12/20/21 ?0351  ?CHOL 268*  ?TRIG 183*  ?HDL 57  ?[place] 174*  ?CHOLHDL 4.7  ?  ?Hematology ?Recent Labs  ?Lab 12/20/21 ?0351  ?WBC 4.4  ?RBC 3.83*  ?HGB 11.6*  ?HCT 35.7*  ?MCV 93.2  ?MCH 30.3  ?MCHC 32.5  ?RDW 14.0  ?PLT 198  ? ?Thyroid No results for input(s): TSH, FREET4 in the last 168 hours.  ?BNP ?Recent Labs  ?Lab 12/20/21 ?3419  ?BNP 202.9*  ?  ?Lab Results  ?Component Value Date  ? HGBA1C 6.4 (H) 12/20/2021  ? ? ? ? ?Radiology  ?  ?No results found. ? ?Cardiac Studies  ? ?CARDIAC CATH: 12/20/2021 ?  Inf Sept lesion is 100% stenosed. ?  Mid RCA lesion is 20% stenosed. ?  Mid LAD lesion is 20% stenosed. ?  ?NSTEMI with culprit vessel felt to be a very small sub-Raegan Winders off of the right PDA. This is a 1.5 mm vessel and supplies a small portion of myocardium.  ?Mild disease in the mid LAD and mid RCA ?LVEDP=15 mmHg ?  ?Recommendations: Medical management of CAD. I will start Plavix and continue ASA, beta blocker and statin.  ?Diagnostic ?Dominance: Right ? ? ?ECHO: 11/20/2021 ? 1. Left ventricular ejection fraction, by estimation, is 60 to 65%. The  ?left ventricle has normal function. The left ventricle has no regional  ?wall motion abnormalities.  There is mild left ventricular hypertrophy.  ?Left ventricular diastolic parameters  ?are consistent with Grade I diastolic dysfunction (impaired relaxation).  ? 2. Right ventricular systolic function is normal. The right ventricular  ?size is normal.  ? 3. Left atrial size was mildly dilated.  ? 4. The mitral valve is normal in structure. Trivial mitral valve  ?regurgitation. No evidence of mitral stenosis.  ? 5. The aortic valve is normal in structure. Aortic valve regurgitation is  ?not visualized. No aortic stenosis is present.  ? 6. The inferior vena cava is normal in size with greater than 50%  ?respiratory variability, suggesting right atrial pressure of 3 mmHg.  ?  ? ?Patient Profile  ?   ?61 y.o. female  with hx of poorly controlled hypertension, type 2 diabetes mellitus, CKD III, possible sarcoidosis (?), and anxiety/depression, was admitted 05/03 for NSTEMI, distal PDA culprit. Working on optimizing BP regimen ? ?Assessment & Plan  ?  ?NSTEMI: presented with persistent chest pressure. Trops 1800-> 8,013.  Had EKG with inferolateral TWI that were new compared to April. Underwent LHC. Culprit was small Quiera Diffee off the right PDA. Recommended medical management with DAPT. Normal EF. No valve disease.  ?- continue asa and plavix  ?- continue atorva 80 mg daily ?- continue imdur ?- cardiac rehab ? ?2. HTN: Blood pressures look good ?- home meds are amlodipine 10 mg, nevibolol 20 mg daily, benicar/HCT 40-25 mg qd ?- benicar/HCT held for cath, SBP 120s-150s ?- w/ elevated Cr, cont to hold for now ?- continue hydralazine 25 mg TID ?- continue imdur 30 mg daily ?- Blood pressure is doing well with this regimen ? ?3. CKD: Has hx of CKD stage 3b. Acute on chronic AKI. Post contrast ?- Cr as high as 5.38 in setting of A1c 12.0 in  2015, 1.36 in 2022. Baseline now ~1.3-1.6 ?- Crt 3.19 ?- agree with fluids  ? ?4. DM ?- A1c 6.4 this admit ?- resume metformin 48 hr after cath, continue other home meds ?- SSI while  here ? ?No changes in her current regimen. Can consider  checking a crt later today and if down trending can dispo with close follow up. I also told her possibly tomorrow as for dispo as well. Will plan for follow up appointment.  ? ?For questions or updates, please contact [place] ?Please consult [email or web address] for contact info under  ? ?  ?   ?Signed, ?Janina Mayo, MD  ?12/23/2021, 10:51 AM   ? ?

## 2021-12-24 DIAGNOSIS — F418 Other specified anxiety disorders: Secondary | ICD-10-CM

## 2021-12-24 DIAGNOSIS — I2510 Atherosclerotic heart disease of native coronary artery without angina pectoris: Secondary | ICD-10-CM

## 2021-12-24 LAB — BASIC METABOLIC PANEL
Anion gap: 5 (ref 5–15)
BUN: 26 mg/dL — ABNORMAL HIGH (ref 6–20)
CO2: 17 mmol/L — ABNORMAL LOW (ref 22–32)
Calcium: 8 mg/dL — ABNORMAL LOW (ref 8.9–10.3)
Chloride: 111 mmol/L (ref 98–111)
Creatinine, Ser: 3.05 mg/dL — ABNORMAL HIGH (ref 0.44–1.00)
GFR, Estimated: 17 mL/min — ABNORMAL LOW (ref >60)
Glucose, Bld: 142 mg/dL — ABNORMAL HIGH (ref 70–99)
Potassium: 4.3 mmol/L (ref 3.5–5.1)
Sodium: 133 mmol/L — ABNORMAL LOW (ref 135–145)

## 2021-12-24 LAB — GLUCOSE, CAPILLARY: Glucose-Capillary: 149 mg/dL — ABNORMAL HIGH (ref 70–99)

## 2021-12-24 MED ORDER — CLOPIDOGREL BISULFATE 75 MG PO TABS
75.0000 mg | ORAL_TABLET | Freq: Every day | ORAL | 5 refills | Status: DC
Start: 2021-12-25 — End: 2022-05-10

## 2021-12-24 MED ORDER — HYDRALAZINE HCL 25 MG PO TABS: 25.0000 mg | ORAL_TABLET | Freq: Three times a day (TID) | ORAL | 5 refills | Status: DC

## 2021-12-24 MED ORDER — ASPIRIN 81 MG PO TBEC
81.0000 mg | DELAYED_RELEASE_TABLET | Freq: Every day | ORAL | 5 refills | Status: DC
Start: 2021-12-25 — End: 2022-05-11

## 2021-12-24 MED ORDER — NITROGLYCERIN 0.4 MG SL SUBL: 0.4000 mg | SUBLINGUAL_TABLET | SUBLINGUAL | 3 refills | Status: DC | PRN

## 2021-12-24 MED ORDER — ATORVASTATIN CALCIUM 80 MG PO TABS: 80.0000 mg | ORAL_TABLET | Freq: Every day | ORAL | 5 refills | Status: DC

## 2021-12-24 MED ORDER — ISOSORBIDE MONONITRATE ER 30 MG PO TB24
30.0000 mg | ORAL_TABLET | Freq: Every day | ORAL | 5 refills | Status: DC
Start: 2021-12-25 — End: 2022-02-04

## 2021-12-24 NOTE — Progress Notes (Signed)
? ?Progress Note ? ?Patient Name: Carol [name] ?Date of Encounter: 12/24/2021 ? ?[facility] Cardiologist: Janina Mayo, MD  ? ?Subjective  ? ?No chest pain. Bps improved.  Crt 1.9->3.18->3.19->3.05 ?She feels well and ready to go home ? ?Inpatient Medications  ?  ?Scheduled Meds: ? amLODipine  10 mg Oral Daily  ? aspirin EC  81 mg Oral Daily  ? atorvastatin  80 mg Oral Daily  ? clopidogrel  75 mg Oral Daily  ? hydrALAZINE  25 mg Oral Q8H  ? insulin aspart  0-20 Units Subcutaneous TID WC  ? insulin aspart  0-5 Units Subcutaneous QHS  ? insulin glargine-yfgn  10 Units Subcutaneous QHS  ? isosorbide mononitrate  30 mg Oral Daily  ? nebivolol  20 mg Oral Daily  ? QUEtiapine  300 mg Oral QHS  ? sertraline  50 mg Oral Daily  ? sodium chloride flush  3 mL Intravenous Q12H  ? ?Continuous Infusions: ? sodium chloride    ? sodium chloride 50 mL/hr at 12/24/21 0731  ? ?PRN Meds: ?sodium chloride, acetaminophen, ALPRAZolam, alum & mag hydroxide-simeth, hydrOXYzine, metoprolol tartrate, nitroGLYCERIN, ondansetron (ZOFRAN) IV, oxyCODONE-acetaminophen, sodium chloride flush  ? ?Vital Signs  ?  ?Vitals:  ? 12/23/21 0456 12/23/21 1142 12/23/21 2100 12/24/21 0459  ?BP: 111/62 134/69 116/70 119/65  ?Pulse: 63 70 (!) 58 (!) 57  ?Resp: 18  18 18   ?Temp: 98.6 ?F (37 ?C) 98.5 ?F (36.9 ?C) 98.1 ?F (36.7 ?C) 98.3 ?F (36.8 ?C)  ?TempSrc: Oral Oral Oral Oral  ?SpO2: 98% 98% 95% 97%  ?Weight:      ?Height:      ? ? ?Intake/Output Summary (Last 24 hours) at 12/24/2021 0949 ?Last data filed at 12/23/2021 2205 ?Gross per 24 hour  ?Intake 240 ml  ?Output --  ?Net 240 ml  ? ? ?  12/21/2021  ?  3:50 AM 12/20/2021  ?  8:00 PM 12/20/2021  ?  3:56 AM  ?Last 3 Weights  ?Weight (lbs) 199 lb 199 lb 1.6 oz 170 lb  ?Weight (kg) 90.266 kg 90.311 kg 77.111 kg  ?   ? ?Telemetry  ?  ?SR,- Personally Reviewed ? ?ECG  ?  ?None today - Personally Reviewed ? ?Physical Exam  ? ?Physical Exam ?Gen: well appearing ?Neuro: alert and oriented ?CV: r,r,r no  murmurs. No JVD ?Pulm: nl wob, CLAB ?Abd: non distended ?Ext: No LE edema ?Skin: warm and well perfused ?Psych: normal mood ? ? ?Labs  ?  ?High Sensitivity Troponin:   ?Recent Labs  ?Lab 11/26/21 ?1140 11/26/21 ?1340 12/20/21 ?0351 12/20/21 ?7062  ?TROPONINIHS 44* 48* 1,895* 8,013*  ?   ?Chemistry ?Recent Labs  ?Lab 12/20/21 ?0351 12/21/21 ?3762 12/22/21 ?8315 12/23/21 ?0409 12/24/21 ?0100  ?NA 138   < > 136 134* 133*  ?K 3.6   < > 4.0 4.2 4.3  ?CL 111   < > 108 111 111  ?CO2 20*   < > 18* 17* 17*  ?GLUCOSE 211*   < > 182* 98 142*  ?BUN 13   < > 23* 25* 26*  ?CREATININE 1.72*   < > 3.18* 3.19* 3.05*  ?CALCIUM 8.2*   < > 8.1* 8.1* 8.0*  ?PROT 5.5*  --   --   --   --   ?ALBUMIN 2.1*  --   --   --   --   ?AST 43*  --   --   --   --   ?ALT 13  --   --   --   --   ?  ALKPHOS 104  --   --   --   --   ?BILITOT 0.4  --   --   --   --   ?GFRNONAA 34*   < > 16* 16* 17*  ?ANIONGAP 7   < > 10 6 5   ? < > = values in this interval not displayed.  ?  ?Lipids  ?Recent Labs  ?Lab 12/20/21 ?0351  ?CHOL 268*  ?TRIG 183*  ?HDL 57  ?[place] 174*  ?CHOLHDL 4.7  ?  ?Hematology ?Recent Labs  ?Lab 12/20/21 ?0351  ?WBC 4.4  ?RBC 3.83*  ?HGB 11.6*  ?HCT 35.7*  ?MCV 93.2  ?MCH 30.3  ?MCHC 32.5  ?RDW 14.0  ?PLT 198  ? ?Thyroid No results for input(s): TSH, FREET4 in the last 168 hours.  ?BNP ?Recent Labs  ?Lab 12/20/21 ?7062  ?BNP 202.9*  ?  ?Lab Results  ?Component Value Date  ? HGBA1C 6.4 (H) 12/20/2021  ? ? ? ? ?Radiology  ?  ?No results found. ? ?Cardiac Studies  ? ?CARDIAC CATH: 12/20/2021 ?  Inf Sept lesion is 100% stenosed. ?  Mid RCA lesion is 20% stenosed. ?  Mid LAD lesion is 20% stenosed. ?  ?NSTEMI with culprit vessel felt to be a very small sub-Anothy Bufano off of the right PDA. This is a 1.5 mm vessel and supplies a small portion of myocardium.  ?Mild disease in the mid LAD and mid RCA ?LVEDP=15 mmHg ?  ?Recommendations: Medical management of CAD. I will start Plavix and continue ASA, beta blocker and statin.  ?Diagnostic ?Dominance:  Right ? ? ?ECHO: 11/20/2021 ? 1. Left ventricular ejection fraction, by estimation, is 60 to 65%. The  ?left ventricle has normal function. The left ventricle has no regional  ?wall motion abnormalities. There is mild left ventricular hypertrophy.  ?Left ventricular diastolic parameters  ?are consistent with Grade I diastolic dysfunction (impaired relaxation).  ? 2. Right ventricular systolic function is normal. The right ventricular  ?size is normal.  ? 3. Left atrial size was mildly dilated.  ? 4. The mitral valve is normal in structure. Trivial mitral valve  ?regurgitation. No evidence of mitral stenosis.  ? 5. The aortic valve is normal in structure. Aortic valve regurgitation is  ?not visualized. No aortic stenosis is present.  ? 6. The inferior vena cava is normal in size with greater than 50%  ?respiratory variability, suggesting right atrial pressure of 3 mmHg.  ?  ? ?Patient Profile  ?   ?61 y.o. female  with hx of poorly controlled hypertension, type 2 diabetes mellitus, CKD III, possible sarcoidosis (?), and anxiety/depression, was admitted 05/03 for NSTEMI, distal PDA culprit. Working on optimizing BP regimen ? ?Assessment & Plan  ?  ?NSTEMI: presented with persistent chest pressure. Trops 1800-> 8,013.  Had EKG with inferolateral TWI that were new compared to April. Underwent LHC. Culprit was small Curley [place] off the right PDA. Recommended medical management with DAPT. Normal EF. No valve disease.  ?- continue asa and plavix  ?- continue atorva 80 mg daily ?- continue imdur ?- cardiac rehab ? ?2. HTN: Blood pressures look good ?- home meds are amlodipine 10 mg, nevibolol 20 mg daily, benicar/HCT 40-25 mg qd ?- benicar/HCT held for cath, SBP 120s-150s ?- w/ elevated Cr, cont to hold for now ?- continue hydralazine 25 mg TID ?- continue imdur 30 mg daily ?- Blood pressure is doing well with this regimen ? ?3. CKD: Has hx of CKD stage 3b. Acute on  chronic AKI. Post contrast nephropathy.  ?- Cr as high as 5.38  in setting of A1c 12.0 in  2015, 1.36 in 2022. Baseline now ~1.3-1.6 ?- Crt 3s; plateauing. She has underlying CKD and taking time to recover. Will send outpatient referral to nephrology ?- s/p fluids post cath ? ?4. DM ?- A1c 6.4 this admit ?- hold metformin until she is seen in follow up ?-GFR <20; no plan for SGLT2 at this time ?- SSI while here ? ?Plan for follow up appointment and labs. Sending referral to nephrology ; may require IHD down the road. ? ? ?For questions or updates, please contact [place] ?Please consult [email or web address] for contact info under  ? ?  ?   ?Signed, ?Janina Mayo, MD  ?12/24/2021, 9:49 AM   ? ?

## 2021-12-24 NOTE — Discharge Summary (Addendum)
?Discharge Summary  ?  ?Patient ID: [name] ?MRN: [id number]; DOB: [birth year] ? ?Admit date: 12/20/2021 ?Discharge date: 12/24/2021 ? ?PCP:  Simona Huh, NP ?  ?[facility] Providers ?Cardiologist:  Janina Mayo, MD      ? ? ?Discharge Diagnoses  ?  ?Principal Problem: ?  NSTEMI (non-ST elevated myocardial infarction) ([place]) ?Active Problems: ?  Acute renal failure superimposed on stage 3b chronic kidney disease ([place]) ?  Diabetes mellitus ([place]) ?  Essential hypertension ?  Pure hypercholesterolemia ?  CAD in native artery ?  Anxiety with depression ? ? ? ?Diagnostic Studies/Procedures  ?  ?2d echo 12/20/21 ? 1. Left ventricular ejection fraction, by estimation, is 60 to 65%. The  ?left ventricle has normal function. The left ventricle has no regional  ?wall motion abnormalities. There is mild left ventricular hypertrophy.  ?Left ventricular diastolic parameters  ?are consistent with Grade I diastolic dysfunction (impaired relaxation).  ? 2. Right ventricular systolic function is normal. The right ventricular  ?size is normal.  ? 3. Left atrial size was mildly dilated.  ? 4. The mitral valve is normal in structure. Trivial mitral valve  ?regurgitation. No evidence of mitral stenosis.  ? 5. The aortic valve is normal in structure. Aortic valve regurgitation is  ?not visualized. No aortic stenosis is present.  ? 6. The inferior vena cava is normal in size with greater than 50%  ?respiratory variability, suggesting right atrial pressure of 3 mmHg.  ?  ?Cath 12/20/21 ?  Inf Sept lesion is 100% stenosed. ?  Mid RCA lesion is 20% stenosed. ?  Mid LAD lesion is 20% stenosed. ?  ?NSTEMI with culprit vessel felt to be a very small sub-branch off of the right PDA. This is a 1.5 mm vessel and supplies a small portion of myocardium.  ?Mild disease in the mid LAD and mid RCA ?LVEDP=15 mmHg ?  ?Recommendations: Medical management of CAD. I will start Plavix and continue ASA, beta blocker and statin.  ?_____________ ?   ?History of Present Illness   ?  ?Gracemarie Skeet is a 61 y.o. female with poorly controlled hypertension, type 2 diabetes mellitus, CKD IIIb, possible sarcoidosis (?), and anxiety/depression who presented to [facility] with chest pain and NSTEMI. ?  ?Ms. Canty presented to the ED about 3 weeks ago with substernal chest discomfort. Her EKG was relatively unremarkable and her troponins were slightly elevated but flat. This was felt to be due to poorly controlled hypertension, and she was d/c'd with an additional blood pressure agent.  ?  ?She returned to the hospital 12/20/21 with dyspnea on exertion and substernal chest pressure. She had been "slowing down" for the past few weeks with getting winded with exertion but overnight the day of admission developed intense substernal chest pressure so called EMS. En route there was concern for a STEMI based on a strip that appeared to have a right bundle branch block.  Her twelve-lead here showed some subtle inferior ST elevations but certainly did not meet criteria for a STEMI. She was given nitro with some improvement in her chest pain although it did not fully resolve. She required multiple doses of narcotics and still did not have full resolution of her chest discomfort. She was hypertensive in the systolic 465K and initial troponin was 1895. She was placed on IV NTG, IV heparin and admitted to the cardiology service for further management. ? ?Hospital Course  ?   ?1. NSTEMI/CAD (troponin peak 8,013) ?-  underwent LHC 12/20/21 demonstrating 100% inf sept, 20% mRCA, 20% mLAD - culprit vessel felt to be a very small sub-branch off of the right PDA, supplies a small portion of myocardium -> medical therapy recommended ?- 2D echo 12/20/21 EF 60-65%, mild LVH, g1DD, mild LAE, trivial MR ?- new medicines include ASA, atorvastatin (changed from home pravastatin), clopidogrel, hydralazine, isosorbide mononitrate, and as-needed nitroglycerin ?- continued on home  nebivolol ?- seen by cardiac rehab and referred to phase 2 ?  ?2. Essential HTN, hypertensive on arrival ?- SBP 200s on arrival, with medications adjusted while admitted ?- olmesartan/HCTZ held in context of AKI ?- BP controlled at discharge on amlodipine 10mg  daily, hydralazine 25mg  TID, Imdur 30mg  daily, nebivolol 20mg  daily ?  ?3. AKI superimposed on CKD stage 3b (contrast nephropathy) ?- baseline Cr appears 1.3-1.7 recently ?- post-cath Cr peaked at 3.19, olmesartan-HCTZ held, treated with IV fluids, Cr has plateaued and is on the decline to 3.05 today ?- will stay off olmesartan-HCTZ at discharge, also advised to avoid NSAIDs ?- f/u appt requested in 5-7 days from our office - recommend provider obtain BMET at [place] ?- will also include outpatient nephrology referral request in message to office ?  ?4. Diabetes mellitus ?- A1C 6.4 ?- pt confirmed home insulin regimen is Lantus 60u daily and Novolog 16u BID ?- per review with Dr. Harl Bowie, plan to resume PTA home insulin regimen at discharge but continue to hold metformin and glimepiride as creatinine remains above baseline - patient recommended to f/u with PCP within 1 week of discharge ?  ?5. Anxiety/depression ?- continued on home quetiapine and sertraline at discharge ?- also on home pain meds without change at discharge, but did provide instructions regarding watching for max acetaminophen dosing as she also takes Tylenol PM ?  ?Given the multiple medication changes and already complex medication list prior to admission, she was encouraged to bring all of her medications to her upcoming visits to ensure she is taking everything correctly. I very specifically outlined all of her medicine instructions on AVS (see below). Her usual pharmacy is closed today (Sunday) so patient and family requested rx's sent to [facility] on [address]. Dr. Harl Bowie has seen and examined the patient today and feels she is stable for discharge. I have sent a message to our office's scheduling  team requesting a close follow-up appointment within 5-7 days, and our office will call the patient with this information. I also requested nephrology referral per Dr. Nelly Laurence recommendation. In addition to med changes above, dicyclomine, docusate, odansetron were removed from med list as these were old rx's and patient indicated she no longer takes these. ? ? ?Did the patient have an acute coronary syndrome (MI, NSTEMI, STEMI, etc) this admission?:  Yes                              ? ?AHA/ACC Clinical Performance & Quality Measures: ?Aspirin prescribed? - Yes ?ADP Receptor Inhibitor (Plavix/Clopidogrel, Brilinta/Ticagrelor or Effient/Prasugrel) prescribed (includes medically managed patients)? - Yes ?Beta Blocker prescribed? - Yes ?High Intensity Statin (Lipitor 40-80mg  or Crestor 20-40mg ) prescribed? - Yes ?EF assessed during THIS hospitalization? - Yes ?For EF <40%, was ACEI/ARB prescribed? - Not Applicable (EF >/= 79%) ?For EF <40%, Aldosterone Antagonist (Spironolactone or Eplerenone) prescribed? - Not Applicable (EF >/= 02%) ?Cardiac Rehab Phase II ordered (including medically managed patients)? - Yes  ? ?   ? ?The patient will be scheduled for a TOC  follow up appointment in 5-7 days.  A message has been sent to the [facility] and Scheduling Pool at the office where the patient should be seen for follow up.  ?_____________ ? ?Discharge Vitals ?Blood pressure 126/76, pulse 66, temperature 98.3 ?F (36.8 ?C), temperature source Oral, resp. rate 18, height 5\' 4"  (1.626 m), weight 90.3 kg, SpO2 98 %.  ?Filed Weights  ? 12/20/21 0356 12/20/21 2000 12/21/21 0350  ?Weight: 77.1 kg 90.3 kg 90.3 kg  ? ? ?Labs & Radiologic Studies  ?  ?CBC ?No results for input(s): WBC, NEUTROABS, HGB, HCT, MCV, PLT in the last 72 hours. ?Basic Metabolic Panel ?Recent Labs  ?  12/23/21 ?0409 12/24/21 ?0100  ?NA 134* 133*  ?K 4.2 4.3  ?CL 111 111  ?CO2 17* 17*  ?GLUCOSE 98 142*  ?BUN 25* 26*  ?CREATININE 3.19* 3.05*  ?CALCIUM 8.1* 8.0*   ? ?Liver Function Tests ?No results for input(s): AST, ALT, ALKPHOS, BILITOT, PROT, ALBUMIN in the last 72 hours. ?No results for input(s): LIPASE, AMYLASE in the last 72 hours. ?High Sensitivity [place]

## 2021-12-25 ENCOUNTER — Emergency Department (HOSPITAL_COMMUNITY): Payer: Medicare Other

## 2021-12-25 ENCOUNTER — Telehealth (HOSPITAL_COMMUNITY): Payer: Self-pay

## 2021-12-25 ENCOUNTER — Encounter (HOSPITAL_COMMUNITY): Payer: Self-pay | Admitting: *Deleted

## 2021-12-25 ENCOUNTER — Emergency Department (HOSPITAL_COMMUNITY)
Admission: EM | Admit: 2021-12-25 | Discharge: 2021-12-25 | Disposition: A | Discharge status: Home / Self Care | Payer: Medicare Other | Attending: Emergency Medicine | Admitting: Emergency Medicine

## 2021-12-25 ENCOUNTER — Other Ambulatory Visit: Payer: Self-pay

## 2021-12-25 DIAGNOSIS — Z7902 Long term (current) use of antithrombotics/antiplatelets: Secondary | ICD-10-CM | POA: Diagnosis not present

## 2021-12-25 DIAGNOSIS — E119 Type 2 diabetes mellitus without complications: Secondary | ICD-10-CM | POA: Diagnosis not present

## 2021-12-25 DIAGNOSIS — I10 Essential (primary) hypertension: Secondary | ICD-10-CM | POA: Insufficient documentation

## 2021-12-25 DIAGNOSIS — R079 Chest pain, unspecified: Secondary | ICD-10-CM | POA: Diagnosis present

## 2021-12-25 DIAGNOSIS — R0789 Other chest pain: Secondary | ICD-10-CM | POA: Diagnosis not present

## 2021-12-25 DIAGNOSIS — Z7982 Long term (current) use of aspirin: Secondary | ICD-10-CM | POA: Insufficient documentation

## 2021-12-25 DIAGNOSIS — Z79899 Other long term (current) drug therapy: Secondary | ICD-10-CM | POA: Diagnosis not present

## 2021-12-25 DIAGNOSIS — Z794 Long term (current) use of insulin: Secondary | ICD-10-CM | POA: Diagnosis not present

## 2021-12-25 LAB — CBC WITH DIFFERENTIAL/PLATELET
Abs Immature Granulocytes: 0.03 10*3/uL (ref 0.00–0.07)
Basophils Absolute: 0 10*3/uL (ref 0.0–0.1)
Basophils Relative: 0 %
Eosinophils Absolute: 0 10*3/uL (ref 0.0–0.5)
Eosinophils Relative: 1 %
HCT: 32.8 % — ABNORMAL LOW (ref 36.0–46.0)
Hemoglobin: 11 g/dL — ABNORMAL LOW (ref 12.0–15.0)
Immature Granulocytes: 0 %
Lymphocytes Relative: 11 %
Lymphs Abs: 0.8 10*3/uL (ref 0.7–4.0)
MCH: 31.3 pg (ref 26.0–34.0)
MCHC: 33.5 g/dL (ref 30.0–36.0)
MCV: 93.4 fL (ref 80.0–100.0)
Monocytes Absolute: 0.5 10*3/uL (ref 0.1–1.0)
Monocytes Relative: 7 %
Neutro Abs: 5.4 10*3/uL (ref 1.7–7.7)
Neutrophils Relative %: 81 %
Platelets: 166 10*3/uL (ref 150–400)
RBC: 3.51 MIL/uL — ABNORMAL LOW (ref 3.87–5.11)
RDW: 13.4 % (ref 11.5–15.5)
WBC: 6.8 10*3/uL (ref 4.0–10.5)
nRBC: 0 % (ref 0.0–0.2)

## 2021-12-25 LAB — BASIC METABOLIC PANEL
Anion gap: 10 (ref 5–15)
BUN: 25 mg/dL — ABNORMAL HIGH (ref 6–20)
CO2: 15 mmol/L — ABNORMAL LOW (ref 22–32)
Calcium: 8.8 mg/dL — ABNORMAL LOW (ref 8.9–10.3)
Chloride: 107 mmol/L (ref 98–111)
Creatinine, Ser: 2.37 mg/dL — ABNORMAL HIGH (ref 0.44–1.00)
GFR, Estimated: 23 mL/min — ABNORMAL LOW (ref >60)
Glucose, Bld: 167 mg/dL — ABNORMAL HIGH (ref 70–99)
Potassium: 5.1 mmol/L (ref 3.5–5.1)
Sodium: 132 mmol/L — ABNORMAL LOW (ref 135–145)

## 2021-12-25 LAB — TROPONIN I (HIGH SENSITIVITY)
Troponin I (High Sensitivity): 2034 ng/L (ref <18)
Troponin I (High Sensitivity): 2009 ng/L (ref <18)

## 2021-12-25 NOTE — ED Triage Notes (Signed)
Patient \\presents  to  ed via [facility] states patient was c/o chest pain onset yest. States she was recently hospitalized and discharged yest. States pain is worse with inspiration. States she called ems 3 times to house yest and was transported because she was scared.  ?

## 2021-12-25 NOTE — ED Provider Notes (Signed)
? ?[facility]  ?Provider Note ? ?CSN: [id number] ?Arrival date & time: 12/25/21 0428 ? ?History ?Chief Complaint  ?Patient presents with  ? Chest Pain  ? ? ?Carol [name] is a 61 y.o. female with history of anxiety, HTN, DM and HLD was admitted a few days ago for NSTEMI, had LHC showing a small distal occlusion not amenable to intervention and she was discharged <24hours ago with Rx for ASA/Plavix and plan for medical management. Apparently, she has called EMS three times since discharge with complaints of chest pain because she was anxious. She was transported this last time for evaluation. She took NTG without much change, similar to when she was hospitalized before. ? ? ?Home Medications ?Prior to Admission medications   ?Medication Sig Start Date End Date Taking? Authorizing Provider  ?albuterol (PROVENTIL HFA;VENTOLIN HFA) 108 (90 Base) MCG/ACT inhaler Inhale 2 puffs into the lungs every 6 (six) hours as needed for wheezing or shortness of breath.    [provider]  ?ALPRAZolam Duanne Moron) 1 MG tablet Take 1 mg by mouth 2 (two) times daily as needed for anxiety.  09/02/15   [provider]  ?amLODipine (NORVASC) 10 MG tablet Take 1 tablet (10 mg total) by mouth daily. 11/26/21   Varney Biles, MD  ?aspirin EC 81 MG EC tablet Take 1 tablet (81 mg total) by mouth daily. Swallow whole. 12/25/21   Dunn, Nedra Hai, PA-C  ?atorvastatin (LIPITOR) 80 MG tablet Take 1 tablet (80 mg total) by mouth daily. 12/25/21   Dunn, Nedra Hai, PA-C  ?budesonide-formoterol (SYMBICORT) 160-4.5 MCG/ACT inhaler Inhale 2 puffs into the lungs in the morning and at bedtime. 11/09/21   [provider]  ?Buprenorphine HCl-Naloxone HCl 4-1 MG FILM Place 0.5 strips under the tongue daily as needed (narcotic dependance). 08/01/21   [provider]  ?cetirizine (ZYRTEC) 10 MG tablet Take 10 mg by mouth daily as needed for allergies.    [provider]  ?clopidogrel  (PLAVIX) 75 MG tablet Take 1 tablet (75 mg total) by mouth daily. 12/25/21   Dunn, Nedra Hai, PA-C  ?cyclobenzaprine (FLEXERIL) 10 MG tablet Take 1 tablet (10 mg total) by mouth 3 (three) times daily as needed for muscle spasms. 05/18/19   Domenic Moras, PA-C  ?diphenhydramine-acetaminophen (TYLENOL PM) 25-500 MG TABS Take 2 tablets by mouth at bedtime as needed (For sleeping disorder.).     [provider]  ?ergocalciferol (VITAMIN D2) 1.25 MG (50000 UT) capsule Take 50,000 Units by mouth every Wednesday.    [provider]  ?gabapentin (NEURONTIN) 100 MG capsule Take 100 mg by mouth 3 (three) times daily.    [provider]  ?hydrALAZINE (APRESOLINE) 25 MG tablet Take 1 tablet (25 mg total) by mouth every 8 (eight) hours. 12/24/21   Dunn, Nedra Hai, PA-C  ?hydrOXYzine (ATARAX) 50 MG tablet Take 50 mg by mouth 3 (three) times daily as needed for anxiety or itching.    [provider]  ?insulin aspart (NOVOLOG) 100 UNIT/ML FlexPen Inject 16 Units into the skin 2 (two) times daily.    [provider]  ?insulin glargine (LANTUS) 100 UNIT/ML injection Inject 60 Units into the skin daily.    [provider]  ?isosorbide mononitrate (IMDUR) 30 MG 24 hr tablet Take 1 tablet (30 mg total) by mouth daily. 12/25/21   Charlie Pitter, PA-C  ?Nebivolol HCl 20 MG TABS Take 20 mg by mouth daily. 12/07/21   [provider]  ?  nitroGLYCERIN (NITROSTAT) 0.4 MG SL tablet Place 1 tablet (0.4 mg total) under the tongue every 5 (five) minutes as needed for chest pain (up to 3 doses). 12/24/21   Dunn, Nedra Hai, PA-C  ?oxyCODONE-acetaminophen (PERCOCET/ROXICET) 5-325 MG tablet Take 1-2 tablets by mouth every 6 (six) hours as needed for severe pain. 08/31/15   Cartner, Marland Kitchen, PA-C  ?pantoprazole (PROTONIX) 40 MG tablet Take 1 tablet (40 mg total) by mouth daily at 12 noon. ?Patient taking differently: Take 40 mg by mouth daily as needed (acid reflux). 02/02/14   Regalado, Belkys A, MD   ?QUEtiapine (SEROQUEL XR) 300 MG 24 hr tablet Take 300 mg by mouth at bedtime. 12/07/21   [provider]  ?sertraline (ZOLOFT) 50 MG tablet Take 50 mg by mouth daily.    [provider]  ?triamcinolone ointment (KENALOG) 0.1 % Apply 1 application topically 2 (two) times daily as needed (Apply to feet after showering for dry skin.).     [provider]  ? ? ? ?Allergies    ?Penicillins and Tramadol ? ? ?Review of Systems   ?Review of Systems ?Please see HPI for pertinent positives and negatives ? ?Physical Exam ?BP (!) 170/83   Pulse 63   Temp 97.7 ?F (36.5 ?C) (Oral)   Resp 20   Ht 5\' 4"  (1.626 m)   Wt 77.1 kg   SpO2 97%   BMI 29.18 kg/m?  ? ?Physical Exam ?Vitals and nursing note reviewed.  ?Constitutional:   ?   General: She is not in acute distress. ?   Appearance: Normal appearance. She is not ill-appearing.  ?HENT:  ?   Head: Normocephalic and atraumatic.  ?   Nose: Nose normal.  ?   Mouth/Throat:  ?   Mouth: Mucous membranes are moist.  ?Eyes:  ?   Extraocular Movements: Extraocular movements intact.  ?   Conjunctiva/sclera: Conjunctivae normal.  ?Cardiovascular:  ?   Rate and Rhythm: Normal rate.  ?Pulmonary:  ?   Effort: Pulmonary effort is normal.  ?   Breath sounds: Normal breath sounds.  ?Chest:  ?   Chest wall: Tenderness (L upper) present.  ?Abdominal:  ?   General: Abdomen is flat.  ?   Palpations: Abdomen is soft.  ?   Tenderness: There is no abdominal tenderness.  ?Musculoskeletal:     ?   General: No swelling. Normal range of motion.  ?   Cervical back: Neck supple.  ?Skin: ?   General: Skin is warm and dry.  ?Neurological:  ?   General: No focal deficit present.  ?   Mental Status: She is alert.  ?   Comments: Falls asleep easily  ?Psychiatric:     ?   Mood and Affect: Mood normal.  ? ? ?ED Results / Procedures / Treatments   ?EKG ?EKG Interpretation ? ?Date/Time:  Monday Dec 25 2021 04:34:11 EDT ?Ventricular Rate:  63 ?PR Interval:  164 ?QRS Duration: 103 ?QT  Interval:  489 ?QTC Calculation: 501 ?R Axis:   27 ?Text Interpretation: Sinus rhythm Probable left atrial enlargement Abnormal T, consider ischemia, lateral leads Interpretation limited secondary to artifact Confirmed by Ripley Fraise [phone]) on 12/25/2021 4:41:09 AM ? ?Procedures ?Procedures ? ?Medications Ordered in the ED ?Medications - No data to display ? ?Initial Impression and Plan ? Patient with recent NSTEMI here for recurrent chest pain multiple times since discharge yesterday. EKG on arrival does not show signs of STEMI, in fact ST/T changes from prior ED visit  appear improved. Will check labs and CXR. Continue to monitor in the ED. Currently NSR on cardiac monitor.  ? ?ED Course  ? ?Clinical Course as of 12/25/21 0716  ?Mon Dec 25, 2021  ?0520 CBC with anemia at baseline. I personally viewed the images from radiology studies and agree with radiologist interpretation: CXR with cardiomegaly and pulm edema unchanged from recent admission. Not currently having any respiratory distress.  ? [CS]  ?2449 BMP with CKD improved from recent admission. Trop is also lower, will continue to observe for second trop for trending.  [CS]  ?[phone] Care of the patient signed out to Dr. Earl Lites at the change of shift pending second trop.  [CS]  ?  ?Clinical Course User Index ?[CS] Truddie Hidden, MD  ? ? ? ?MDM Rules/Calculators/A&P ?Medical Decision Making ?Problems Addressed: ?Chest pain, unspecified type: acute illness or injury ? ?Amount and/or Complexity of Data Reviewed ?Labs: ordered. Decision-making details documented in ED Course. ?Radiology: ordered and independent interpretation performed. Decision-making details documented in ED Course. ?ECG/medicine tests: ordered and independent interpretation performed. Decision-making details documented in ED Course. ? ? ? ?Final Clinical Impression(s) / ED Diagnoses ?Final diagnoses:  ?Chest pain, unspecified type  ? ? ?Rx / DC Orders ?ED Discharge Orders   ? ? None  ? ?   ? ?  ?Truddie Hidden, MD ?12/25/21 [phone] ? ?

## 2021-12-25 NOTE — ED Notes (Signed)
Critical trop2034 reported to Dr. Karle Starch  ?

## 2021-12-25 NOTE — Discharge Instructions (Signed)

## 2021-12-25 NOTE — Telephone Encounter (Signed)
Pt insurance is active and benefits verified through [facility] Medicare Co-pay 0, DED 0/0 met, out of pocket $8,300/$613.52 met, co-insurance 20%. no pre-authorization required. Passport, 12/25/2021_0 :23am, REF# [id number] ? ?2ndary insurance is active and benefits verified through Medicaid. Co-pay 0, DED 0/0 met, out of pocket 0/0 met, co-insurance 0%. No pre-authorization required. Passport 12/25/2021_1 :47am, , REF# [id number] ? ? ?Will contact patient to see if she is interested in the Cardiac Rehab Program. If interested, patient will need to complete follow up appt. Once completed, patient will be contacted for scheduling upon review by the RN Navigator. ?

## 2021-12-25 NOTE — ED Provider Notes (Signed)
?  Provider Note ?MRN:  [id number]  ?Arrival date & time: 12/25/21    ?ED Course and Medical Decision Making  ?Assumed care from [facility] at shift change. ? ?See not from prior team for complete details, in brief:  ?61 yo female, recently admitted for NSTEMI  ?S/p LHC ?Called EMS mx times last night, very anxious ?Trop downtrending ?Delta pending ? ?If continues to downtrend plan for discharge  ? ?Deltra trop is resulted, continues to improve. She has no chest pain at this time. She is HDS. Will plan for DC with o/p cardiology f/u per plan from prior team. ? ?The patient improved significantly and was discharged in stable condition. Detailed discussions were had with the patient regarding current findings, and need for close f/u with PCP or on call doctor. The patient has been instructed to return immediately if the symptoms worsen in any way for re-evaluation. Patient verbalized understanding and is in agreement with current care plan. All questions answered prior to discharge. ? ? ?.Critical Care ?Performed by: Jeanell Sparrow, DO ?Authorized by: Jeanell Sparrow, DO  ? ?Critical care provider statement:  ?  Critical care time (minutes):  30 ?  Critical care time was exclusive of:  Separately billable procedures and treating other patients ?  Critical care was necessary to treat or prevent imminent or life-threatening deterioration of the following conditions:  Cardiac failure ?  Critical care was time spent personally by me on the following activities:  Development of treatment plan with patient or surrogate, discussions with consultants, evaluation of patient's response to treatment, examination of patient, ordering and review of laboratory studies, ordering and review of radiographic studies, ordering and performing treatments and interventions, pulse oximetry, re-evaluation of patient's condition, review of old charts and obtaining history from patient or surrogate ? ?Final Clinical Impressions(s) / ED Diagnoses   ? ?  ICD-10-CM   ?1. Chest pain, unspecified type  R07.9 Ambulatory referral to Cardiology  ?  ?  ?ED Discharge Orders   ? ?      Ordered  ?  Ambulatory referral to Cardiology       ? 12/25/21 0827  ? ?  ?  ? ?  ?  ? ? ?Discharge Instructions   ? ?  ?Return to the Emergency Department if you have unusual chest pain, pressure, or discomfort, shortness of breath, nausea, vomiting, burping, heartburn, tingling upper body parts, sweating, cold, clammy skin, or racing heartbeat. Call 911 if you think you are having a heart attack. Take all cardiac medications as prescribed - notify your doctor if you have any side effects. Follow cardiac diet - avoid fatty & fried foods, don't eat too much red meat, eat lots of fruits & vegetables, and dairy products should be low fat. Please lose weight if you are overweight. Become more active with walking, gardening, or any other activity that gets you to moving. ?  ?Please return to the emergency department immediately for any new or concerning symptoms, or if you get worse. ? ? ? ? ? ? ? ?  ?Jeanell Sparrow, DO ?12/25/21 0827 ? ?

## 2021-12-30 ENCOUNTER — Emergency Department (HOSPITAL_COMMUNITY): Payer: Medicare Other

## 2021-12-30 ENCOUNTER — Encounter (HOSPITAL_COMMUNITY): Payer: Self-pay | Admitting: Emergency Medicine

## 2021-12-30 ENCOUNTER — Inpatient Hospital Stay (HOSPITAL_COMMUNITY)
Admission: EM | Admit: 2021-12-30 | Discharge: 2022-01-03 | DRG: 637 | Disposition: A | Discharge status: Home / Self Care | Payer: Medicare Other | Attending: Internal Medicine | Admitting: Internal Medicine

## 2021-12-30 DIAGNOSIS — F17210 Nicotine dependence, cigarettes, uncomplicated: Secondary | ICD-10-CM | POA: Diagnosis present

## 2021-12-30 DIAGNOSIS — Z823 Family history of stroke: Secondary | ICD-10-CM

## 2021-12-30 DIAGNOSIS — K219 Gastro-esophageal reflux disease without esophagitis: Secondary | ICD-10-CM | POA: Diagnosis present

## 2021-12-30 DIAGNOSIS — I214 Non-ST elevation (NSTEMI) myocardial infarction: Secondary | ICD-10-CM | POA: Diagnosis present

## 2021-12-30 DIAGNOSIS — N184 Chronic kidney disease, stage 4 (severe): Secondary | ICD-10-CM

## 2021-12-30 DIAGNOSIS — S0512XA Contusion of eyeball and orbital tissues, left eye, initial encounter: Secondary | ICD-10-CM | POA: Diagnosis present

## 2021-12-30 DIAGNOSIS — I130 Hypertensive heart and chronic kidney disease with heart failure and stage 1 through stage 4 chronic kidney disease, or unspecified chronic kidney disease: Secondary | ICD-10-CM | POA: Diagnosis present

## 2021-12-30 DIAGNOSIS — Z833 Family history of diabetes mellitus: Secondary | ICD-10-CM

## 2021-12-30 DIAGNOSIS — E162 Hypoglycemia, unspecified: Principal | ICD-10-CM

## 2021-12-30 DIAGNOSIS — Y92031 Bathroom in apartment as the place of occurrence of the external cause: Secondary | ICD-10-CM

## 2021-12-30 DIAGNOSIS — E1122 Type 2 diabetes mellitus with diabetic chronic kidney disease: Secondary | ICD-10-CM | POA: Diagnosis present

## 2021-12-30 DIAGNOSIS — E8779 Other fluid overload: Secondary | ICD-10-CM | POA: Diagnosis not present

## 2021-12-30 DIAGNOSIS — F32A Depression, unspecified: Secondary | ICD-10-CM | POA: Diagnosis present

## 2021-12-30 DIAGNOSIS — N179 Acute kidney failure, unspecified: Secondary | ICD-10-CM | POA: Diagnosis not present

## 2021-12-30 DIAGNOSIS — G20 Parkinson's disease: Secondary | ICD-10-CM | POA: Diagnosis present

## 2021-12-30 DIAGNOSIS — W1839XA Other fall on same level, initial encounter: Secondary | ICD-10-CM | POA: Diagnosis present

## 2021-12-30 DIAGNOSIS — I10 Essential (primary) hypertension: Secondary | ICD-10-CM | POA: Diagnosis present

## 2021-12-30 DIAGNOSIS — Z7951 Long term (current) use of inhaled steroids: Secondary | ICD-10-CM

## 2021-12-30 DIAGNOSIS — N1832 Chronic kidney disease, stage 3b: Secondary | ICD-10-CM

## 2021-12-30 DIAGNOSIS — Z6837 Body mass index (BMI) 37.0-37.9, adult: Secondary | ICD-10-CM

## 2021-12-30 DIAGNOSIS — E1165 Type 2 diabetes mellitus with hyperglycemia: Secondary | ICD-10-CM | POA: Diagnosis not present

## 2021-12-30 DIAGNOSIS — I160 Hypertensive urgency: Secondary | ICD-10-CM | POA: Diagnosis present

## 2021-12-30 DIAGNOSIS — Z841 Family history of disorders of kidney and ureter: Secondary | ICD-10-CM

## 2021-12-30 DIAGNOSIS — E11649 Type 2 diabetes mellitus with hypoglycemia without coma: Principal | ICD-10-CM | POA: Diagnosis present

## 2021-12-30 DIAGNOSIS — G8929 Other chronic pain: Secondary | ICD-10-CM | POA: Diagnosis present

## 2021-12-30 DIAGNOSIS — Z8249 Family history of ischemic heart disease and other diseases of the circulatory system: Secondary | ICD-10-CM

## 2021-12-30 DIAGNOSIS — Z7982 Long term (current) use of aspirin: Secondary | ICD-10-CM

## 2021-12-30 DIAGNOSIS — Z79899 Other long term (current) drug therapy: Secondary | ICD-10-CM

## 2021-12-30 DIAGNOSIS — Z794 Long term (current) use of insulin: Secondary | ICD-10-CM

## 2021-12-30 DIAGNOSIS — F1410 Cocaine abuse, uncomplicated: Secondary | ICD-10-CM

## 2021-12-30 DIAGNOSIS — I2510 Atherosclerotic heart disease of native coronary artery without angina pectoris: Secondary | ICD-10-CM | POA: Diagnosis present

## 2021-12-30 DIAGNOSIS — Z7902 Long term (current) use of antithrombotics/antiplatelets: Secondary | ICD-10-CM

## 2021-12-30 DIAGNOSIS — E669 Obesity, unspecified: Secondary | ICD-10-CM | POA: Diagnosis present

## 2021-12-30 DIAGNOSIS — Z880 Allergy status to penicillin: Secondary | ICD-10-CM

## 2021-12-30 DIAGNOSIS — Z825 Family history of asthma and other chronic lower respiratory diseases: Secondary | ICD-10-CM

## 2021-12-30 DIAGNOSIS — Z888 Allergy status to other drugs, medicaments and biological substances status: Secondary | ICD-10-CM

## 2021-12-30 DIAGNOSIS — J9601 Acute respiratory failure with hypoxia: Secondary | ICD-10-CM | POA: Diagnosis not present

## 2021-12-30 DIAGNOSIS — I5032 Chronic diastolic (congestive) heart failure: Secondary | ICD-10-CM | POA: Diagnosis present

## 2021-12-30 DIAGNOSIS — F419 Anxiety disorder, unspecified: Secondary | ICD-10-CM | POA: Diagnosis present

## 2021-12-30 DIAGNOSIS — T503X5A Adverse effect of electrolytic, caloric and water-balance agents, initial encounter: Secondary | ICD-10-CM | POA: Diagnosis not present

## 2021-12-30 DIAGNOSIS — M549 Dorsalgia, unspecified: Secondary | ICD-10-CM | POA: Diagnosis present

## 2021-12-30 LAB — CBG MONITORING, ED: Glucose-Capillary: 75 mg/dL (ref 70–99)

## 2021-12-30 NOTE — ED Provider Notes (Signed)
?[facility] ?Provider Note ? ? ?CSN: [id number] ?Arrival date & time: 12/30/21  2332 ? ?  ? ?History ? ?No chief complaint on file. ? ? ?Carol [name] is a 61 y.o. female. ? ?61 yo F with a chief complaint of hypoglycemia.  Patient tells me this been going on for the past 24 to 48 hours.  She said to call EMS multiple times today.  Earlier today she had become hypoglycemic and fell and struck her head in the bathtub.  At that time she declined transport to the hospital.  This occurred again this afternoon.  Blood sugar in the 30s.  Given a dose of glucagon with improvement.  Patient was recently hospitalized for chest pain.  Has been having some chest pain off and on.  Had some chest pain yesterday but does not think she had any yet today.  She thinks that some of her medication changed but has trouble remembering which 1 ? ? ? ?  ? ?Home Medications ?Prior to Admission medications   ?Medication Sig Start Date End Date Taking? Authorizing Provider  ?albuterol (PROVENTIL HFA;VENTOLIN HFA) 108 (90 Base) MCG/ACT inhaler Inhale 2 puffs into the lungs every 6 (six) hours as needed for wheezing or shortness of breath.   Yes [provider]  ?ALPRAZolam Duanne Moron) 1 MG tablet Take 1 mg by mouth 2 (two) times daily as needed for anxiety.  09/02/15  Yes [provider]  ?amLODipine (NORVASC) 10 MG tablet Take 1 tablet (10 mg total) by mouth daily. 11/26/21  Yes Varney Biles, MD  ?aspirin EC 81 MG EC tablet Take 1 tablet (81 mg total) by mouth daily. Swallow whole. 12/25/21  Yes Dunn, Dayna N, PA-C  ?atorvastatin (LIPITOR) 80 MG tablet Take 1 tablet (80 mg total) by mouth daily. 12/25/21  Yes Dunn, Dayna N, PA-C  ?budesonide-formoterol (SYMBICORT) 160-4.5 MCG/ACT inhaler Inhale 2 puffs into the lungs in the morning and at bedtime. 11/09/21  Yes [provider]  ?Buprenorphine HCl-Naloxone HCl 4-1 MG FILM Place 0.5 strips under the tongue daily as needed  (narcotic dependance). 08/01/21  Yes [provider]  ?cetirizine (ZYRTEC) 10 MG tablet Take 10 mg by mouth daily as needed for allergies.   Yes [provider]  ?clopidogrel (PLAVIX) 75 MG tablet Take 1 tablet (75 mg total) by mouth daily. 12/25/21  Yes Dunn, Dayna N, PA-C  ?cyclobenzaprine (FLEXERIL) 10 MG tablet Take 1 tablet (10 mg total) by mouth 3 (three) times daily as needed for muscle spasms. 05/18/19  Yes Domenic Moras, PA-C  ?diphenhydramine-acetaminophen (TYLENOL PM) 25-500 MG TABS Take 2 tablets by mouth at bedtime as needed (For sleeping disorder.).    Yes [provider]  ?ergocalciferol (VITAMIN D2) 1.25 MG (50000 UT) capsule Take 50,000 Units by mouth every Wednesday.   Yes [provider]  ?gabapentin (NEURONTIN) 100 MG capsule Take 100 mg by mouth 3 (three) times daily.   Yes [provider]  ?hydrALAZINE (APRESOLINE) 25 MG tablet Take 1 tablet (25 mg total) by mouth every 8 (eight) hours. 12/24/21  Yes Dunn, Dayna N, PA-C  ?hydrOXYzine (ATARAX) 50 MG tablet Take 50 mg by mouth 3 (three) times daily as needed for anxiety or itching.   Yes [provider]  ?insulin aspart (NOVOLOG) 100 UNIT/ML FlexPen Inject 12 Units into the skin 3 (three) times daily with meals.   Yes [provider]  ?insulin glargine (LANTUS) 100 UNIT/ML injection Inject 60 Units into the skin  every evening.   Yes [provider]  ?isosorbide mononitrate (IMDUR) 30 MG 24 hr tablet Take 1 tablet (30 mg total) by mouth daily. 12/25/21  Yes Dunn, Nedra Hai, PA-C  ?Nebivolol HCl 20 MG TABS Take 20 mg by mouth daily. 12/07/21  Yes [provider]  ?nitroGLYCERIN (NITROSTAT) 0.4 MG SL tablet Place 1 tablet (0.4 mg total) under the tongue every 5 (five) minutes as needed for chest pain (up to 3 doses). 12/24/21  Yes Dunn, Dayna N, PA-C  ?oxyCODONE-acetaminophen (PERCOCET/ROXICET) 5-325 MG tablet Take 1-2 tablets by mouth every 6 (six) hours as needed for severe pain.  08/31/15  Yes Cartner, Marland Kitchen, PA-C  ?pantoprazole (PROTONIX) 40 MG tablet Take 1 tablet (40 mg total) by mouth daily at 12 noon. ?Patient taking differently: Take 40 mg by mouth daily as needed (acid reflux). 02/02/14  Yes Regalado, Belkys A, MD  ?QUEtiapine (SEROQUEL XR) 300 MG 24 hr tablet Take 300 mg by mouth at bedtime. 12/07/21  Yes [provider]  ?sertraline (ZOLOFT) 50 MG tablet Take 50 mg by mouth daily.   Yes [provider]  ?triamcinolone ointment (KENALOG) 0.1 % Apply 1 application topically 2 (two) times daily as needed (Apply to feet after showering for dry skin.).    Yes [provider]  ?   ? ?Allergies    ?Penicillins and Tramadol   ? ?Review of Systems   ?Review of Systems ? ?Physical Exam ?Updated Vital Signs ?BP (!) 192/95   Pulse 74   Temp (!) 96.4 ?F (35.8 ?C) (Temporal)   Resp (!) 26   Ht 5\' 4"  (1.626 m)   Wt 77.1 kg   SpO2 95%   BMI 29.18 kg/m?  ?Physical Exam ?Vitals and nursing note reviewed.  ?Constitutional:   ?   General: She is not in acute distress. ?   Appearance: She is well-developed. She is not diaphoretic.  ?HENT:  ?   Head: Normocephalic.  ?   Comments: Left periorbital hematoma.  Laceration to the left forehead underneath the brow. ?Eyes:  ?   Pupils: Pupils are equal, round, and reactive to light.  ?Cardiovascular:  ?   Rate and Rhythm: Normal rate and regular rhythm.  ?   Heart sounds: No murmur heard. ?  No friction rub. No gallop.  ?Pulmonary:  ?   Effort: Pulmonary effort is normal.  ?   Breath sounds: No wheezing or rales.  ?Abdominal:  ?   General: There is no distension.  ?   Palpations: Abdomen is soft.  ?   Tenderness: There is no abdominal tenderness.  ?Musculoskeletal:     ?   General: No tenderness.  ?   Cervical back: Normal range of motion and neck supple.  ?Skin: ?   General: Skin is warm and dry.  ?Neurological:  ?   Mental Status: She is alert and oriented to person, place, and time.  ?Psychiatric:     ?   Behavior: Behavior  normal.  ? ? ?ED Results / Procedures / Treatments   ?Labs ?(all labs ordered are listed, but only abnormal results are displayed) ?Labs Reviewed  ?CBC WITH DIFFERENTIAL/PLATELET - Abnormal; Notable for the following components:  ?    Result Value  ? RBC 3.49 (*)   ? Hemoglobin 10.5 (*)   ? HCT 32.9 (*)   ? All other components within normal limits  ?COMPREHENSIVE METABOLIC PANEL - Abnormal; Notable for the following components:  ? CO2 20 (*)   ?  Glucose, Bld 62 (*)   ? Creatinine, Ser 2.16 (*)   ? Calcium 8.7 (*)   ? Albumin 2.6 (*)   ? Alkaline Phosphatase 127 (*)   ? GFR, Estimated 26 (*)   ? All other components within normal limits  ?RAPID URINE DRUG SCREEN, HOSP PERFORMED - Abnormal; Notable for the following components:  ? Cocaine POSITIVE (*)   ? All other components within normal limits  ?CBG MONITORING, ED - Abnormal; Notable for the following components:  ? Glucose-Capillary 58 (*)   ? All other components within normal limits  ?TROPONIN I (HIGH SENSITIVITY) - Abnormal; Notable for the following components:  ? Troponin I (High Sensitivity) 182 (*)   ? All other components within normal limits  ?LIPASE, BLOOD  ?HEMOGLOBIN A1C  ?CBG MONITORING, ED  ? ? ?EKG ?EKG Interpretation ? ?Date/Time:  Saturday Dec 30 2021 23:40:32 EDT ?Ventricular Rate:  72 ?PR Interval:  151 ?QRS Duration: 98 ?QT Interval:  471 ?QTC Calculation: 516 ?R Axis:   40 ?Text Interpretation: Sinus rhythm Probable left atrial enlargement Abnormal inferior Q waves Borderline T wave abnormalities Prolonged QT interval downsloping st segment in aVL now resolved Otherwise no significant change Confirmed by Deno Etienne [phone]) on 12/31/2021 12:12:44 AM ? ?Radiology ?DG Chest Port 1 View ? ?Result Date: 12/30/2021 ?CLINICAL DATA:  Chest pain. EXAM: PORTABLE CHEST 1 VIEW COMPARISON:  12/25/2021. FINDINGS: The heart is enlarged the mediastinal contour is stable. The pulmonary vasculature is mildly distended. Interstitial prominence is noted  bilaterally and there is mild airspace disease at the left lung base. No definite effusion or pneumothorax. No acute osseous abnormality. IMPRESSION: 1. Cardiomegaly with pulmonary vascular congestion. 2. Interstitial promin

## 2021-12-30 NOTE — ED Notes (Signed)
Warm blankets placed over pt ?

## 2021-12-30 NOTE — ED Notes (Signed)
Apple juice provided to pt ?

## 2021-12-30 NOTE — ED Notes (Signed)
Patient transported to CT 

## 2021-12-30 NOTE — ED Triage Notes (Signed)
Pt bib [facility] from home for hypoglycemia. Pt fell at home and hit her face when blood sugar dropped, NOT on blood thinners. Initial CBG with family was 49, decreased to 31, 1mg  IM glucagon given by EMS. CBG increased to 45, and last CBG 70 just before arrival to ED. Pt was agitated at first and attempting to hit EMS workers, and then went unresponsive just after IM glucagon administration. Pt became more responsive, initially to pain w/ sternal rubs at 2330, and is now able to verbally respond to staff and answer questions appropriately (at 2336).  ? ?EMS vitals: ?BP 136/78 ?HR 68 ?98% room air ?

## 2021-12-30 NOTE — ED Notes (Signed)
Portable x-ray at pt bedside ?

## 2021-12-31 ENCOUNTER — Other Ambulatory Visit: Payer: Self-pay

## 2021-12-31 ENCOUNTER — Encounter (HOSPITAL_COMMUNITY): Payer: Self-pay | Admitting: Internal Medicine

## 2021-12-31 DIAGNOSIS — I160 Hypertensive urgency: Secondary | ICD-10-CM | POA: Diagnosis present

## 2021-12-31 DIAGNOSIS — Z841 Family history of disorders of kidney and ureter: Secondary | ICD-10-CM | POA: Diagnosis not present

## 2021-12-31 DIAGNOSIS — W1839XA Other fall on same level, initial encounter: Secondary | ICD-10-CM | POA: Diagnosis present

## 2021-12-31 DIAGNOSIS — N1832 Chronic kidney disease, stage 3b: Secondary | ICD-10-CM

## 2021-12-31 DIAGNOSIS — E8779 Other fluid overload: Secondary | ICD-10-CM | POA: Diagnosis not present

## 2021-12-31 DIAGNOSIS — F17210 Nicotine dependence, cigarettes, uncomplicated: Secondary | ICD-10-CM | POA: Diagnosis present

## 2021-12-31 DIAGNOSIS — I130 Hypertensive heart and chronic kidney disease with heart failure and stage 1 through stage 4 chronic kidney disease, or unspecified chronic kidney disease: Secondary | ICD-10-CM | POA: Diagnosis present

## 2021-12-31 DIAGNOSIS — E669 Obesity, unspecified: Secondary | ICD-10-CM | POA: Diagnosis present

## 2021-12-31 DIAGNOSIS — E1122 Type 2 diabetes mellitus with diabetic chronic kidney disease: Secondary | ICD-10-CM | POA: Diagnosis present

## 2021-12-31 DIAGNOSIS — E1165 Type 2 diabetes mellitus with hyperglycemia: Secondary | ICD-10-CM | POA: Diagnosis not present

## 2021-12-31 DIAGNOSIS — E11649 Type 2 diabetes mellitus with hypoglycemia without coma: Secondary | ICD-10-CM | POA: Diagnosis not present

## 2021-12-31 DIAGNOSIS — J9601 Acute respiratory failure with hypoxia: Secondary | ICD-10-CM | POA: Diagnosis not present

## 2021-12-31 DIAGNOSIS — I10 Essential (primary) hypertension: Secondary | ICD-10-CM | POA: Diagnosis not present

## 2021-12-31 DIAGNOSIS — Z6837 Body mass index (BMI) 37.0-37.9, adult: Secondary | ICD-10-CM | POA: Diagnosis not present

## 2021-12-31 DIAGNOSIS — N179 Acute kidney failure, unspecified: Secondary | ICD-10-CM | POA: Diagnosis not present

## 2021-12-31 DIAGNOSIS — K219 Gastro-esophageal reflux disease without esophagitis: Secondary | ICD-10-CM | POA: Diagnosis present

## 2021-12-31 DIAGNOSIS — G20 Parkinson's disease: Secondary | ICD-10-CM | POA: Diagnosis present

## 2021-12-31 DIAGNOSIS — I2510 Atherosclerotic heart disease of native coronary artery without angina pectoris: Secondary | ICD-10-CM

## 2021-12-31 DIAGNOSIS — N184 Chronic kidney disease, stage 4 (severe): Secondary | ICD-10-CM

## 2021-12-31 DIAGNOSIS — G8929 Other chronic pain: Secondary | ICD-10-CM | POA: Diagnosis present

## 2021-12-31 DIAGNOSIS — F419 Anxiety disorder, unspecified: Secondary | ICD-10-CM | POA: Diagnosis present

## 2021-12-31 DIAGNOSIS — Y92031 Bathroom in apartment as the place of occurrence of the external cause: Secondary | ICD-10-CM | POA: Diagnosis not present

## 2021-12-31 DIAGNOSIS — M549 Dorsalgia, unspecified: Secondary | ICD-10-CM | POA: Diagnosis present

## 2021-12-31 DIAGNOSIS — I5032 Chronic diastolic (congestive) heart failure: Secondary | ICD-10-CM | POA: Diagnosis present

## 2021-12-31 DIAGNOSIS — F32A Depression, unspecified: Secondary | ICD-10-CM | POA: Diagnosis present

## 2021-12-31 DIAGNOSIS — F1410 Cocaine abuse, uncomplicated: Secondary | ICD-10-CM | POA: Diagnosis present

## 2021-12-31 DIAGNOSIS — I214 Non-ST elevation (NSTEMI) myocardial infarction: Secondary | ICD-10-CM | POA: Diagnosis present

## 2021-12-31 DIAGNOSIS — Z833 Family history of diabetes mellitus: Secondary | ICD-10-CM | POA: Diagnosis not present

## 2021-12-31 LAB — HEMOGLOBIN A1C
Hgb A1c MFr Bld: 6.4 % — ABNORMAL HIGH (ref 4.8–5.6)
Mean Plasma Glucose: 136.98 mg/dL

## 2021-12-31 LAB — CBC WITH DIFFERENTIAL/PLATELET
Abs Immature Granulocytes: 0.02 10*3/uL (ref 0.00–0.07)
Basophils Absolute: 0 10*3/uL (ref 0.0–0.1)
Basophils Relative: 0 %
Eosinophils Absolute: 0.1 10*3/uL (ref 0.0–0.5)
Eosinophils Relative: 1 %
HCT: 32.9 % — ABNORMAL LOW (ref 36.0–46.0)
Hemoglobin: 10.5 g/dL — ABNORMAL LOW (ref 12.0–15.0)
Immature Granulocytes: 0 %
Lymphocytes Relative: 18 %
Lymphs Abs: 1.1 10*3/uL (ref 0.7–4.0)
MCH: 30.1 pg (ref 26.0–34.0)
MCHC: 31.9 g/dL (ref 30.0–36.0)
MCV: 94.3 fL (ref 80.0–100.0)
Monocytes Absolute: 0.6 10*3/uL (ref 0.1–1.0)
Monocytes Relative: 10 %
Neutro Abs: 4.4 10*3/uL (ref 1.7–7.7)
Neutrophils Relative %: 71 %
Platelets: 221 10*3/uL (ref 150–400)
RBC: 3.49 MIL/uL — ABNORMAL LOW (ref 3.87–5.11)
RDW: 13.2 % (ref 11.5–15.5)
WBC: 6.2 10*3/uL (ref 4.0–10.5)
nRBC: 0 % (ref 0.0–0.2)

## 2021-12-31 LAB — CBG MONITORING, ED
Glucose-Capillary: 105 mg/dL — ABNORMAL HIGH (ref 70–99)
Glucose-Capillary: 122 mg/dL — ABNORMAL HIGH (ref 70–99)
Glucose-Capillary: 151 mg/dL — ABNORMAL HIGH (ref 70–99)
Glucose-Capillary: 53 mg/dL — ABNORMAL LOW (ref 70–99)
Glucose-Capillary: 56 mg/dL — ABNORMAL LOW (ref 70–99)
Glucose-Capillary: 58 mg/dL — ABNORMAL LOW (ref 70–99)
Glucose-Capillary: 83 mg/dL (ref 70–99)
Glucose-Capillary: 88 mg/dL (ref 70–99)
Glucose-Capillary: 96 mg/dL (ref 70–99)

## 2021-12-31 LAB — RAPID URINE DRUG SCREEN, HOSP PERFORMED
Amphetamines: NOT DETECTED
Barbiturates: NOT DETECTED
Benzodiazepines: NOT DETECTED
Cocaine: POSITIVE — AB
Opiates: NOT DETECTED
Tetrahydrocannabinol: NOT DETECTED

## 2021-12-31 LAB — COMPREHENSIVE METABOLIC PANEL
ALT: 15 U/L (ref 0–44)
AST: 31 U/L (ref 15–41)
Albumin: 2.6 g/dL — ABNORMAL LOW (ref 3.5–5.0)
Alkaline Phosphatase: 127 U/L — ABNORMAL HIGH (ref 38–126)
Anion gap: 6 (ref 5–15)
BUN: 14 mg/dL (ref 6–20)
CO2: 20 mmol/L — ABNORMAL LOW (ref 22–32)
Calcium: 8.7 mg/dL — ABNORMAL LOW (ref 8.9–10.3)
Chloride: 111 mmol/L (ref 98–111)
Creatinine, Ser: 2.16 mg/dL — ABNORMAL HIGH (ref 0.44–1.00)
GFR, Estimated: 26 mL/min — ABNORMAL LOW (ref >60)
Glucose, Bld: 62 mg/dL — ABNORMAL LOW (ref 70–99)
Potassium: 3.6 mmol/L (ref 3.5–5.1)
Sodium: 137 mmol/L (ref 135–145)
Total Bilirubin: 0.5 mg/dL (ref 0.3–1.2)
Total Protein: 6.9 g/dL (ref 6.5–8.1)

## 2021-12-31 LAB — GLUCOSE, CAPILLARY
Glucose-Capillary: 205 mg/dL — ABNORMAL HIGH (ref 70–99)
Glucose-Capillary: 252 mg/dL — ABNORMAL HIGH (ref 70–99)

## 2021-12-31 LAB — LIPASE, BLOOD: Lipase: 46 U/L (ref 11–51)

## 2021-12-31 LAB — TROPONIN I (HIGH SENSITIVITY): Troponin I (High Sensitivity): 182 ng/L (ref <18)

## 2021-12-31 MED ORDER — DEXTROSE 50 % IV SOLN
1.0000 | Freq: Once | INTRAVENOUS | Status: AC
  Administered 2021-12-31: 50 mL via INTRAVENOUS

## 2021-12-31 MED ORDER — DEXTROSE 50 % IV SOLN: 50.0000 mL | Freq: Once | INTRAVENOUS | Status: AC

## 2021-12-31 MED ORDER — ONDANSETRON HCL 4 MG PO TABS: 4.0000 mg | ORAL_TABLET | Freq: Four times a day (QID) | ORAL | Status: DC | PRN

## 2021-12-31 MED ORDER — DEXTROSE 50 % IV SOLN
1.0000 | Freq: Once | INTRAVENOUS | Status: AC
  Administered 2021-12-31: 50 mL via INTRAVENOUS
  Filled 2021-12-31: qty 50

## 2021-12-31 MED ORDER — ISOSORBIDE MONONITRATE ER 30 MG PO TB24
30.0000 mg | ORAL_TABLET | Freq: Every day | ORAL | Status: DC
  Administered 2021-12-31 – 2022-01-03 (×4): 30 mg via ORAL
  Filled 2021-12-31 (×4): qty 1

## 2021-12-31 MED ORDER — CLOPIDOGREL BISULFATE 75 MG PO TABS
75.0000 mg | ORAL_TABLET | Freq: Every day | ORAL | Status: DC
  Administered 2021-12-31 – 2022-01-03 (×4): 75 mg via ORAL
  Filled 2021-12-31 (×5): qty 1

## 2021-12-31 MED ORDER — ASPIRIN EC 81 MG PO TBEC
81.0000 mg | DELAYED_RELEASE_TABLET | Freq: Every day | ORAL | Status: DC
  Administered 2021-12-31 – 2022-01-03 (×4): 81 mg via ORAL
  Filled 2021-12-31 (×4): qty 1

## 2021-12-31 MED ORDER — NEBIVOLOL HCL 10 MG PO TABS
20.0000 mg | ORAL_TABLET | Freq: Every day | ORAL | Status: DC
  Administered 2021-12-31 – 2022-01-03 (×4): 20 mg via ORAL
  Filled 2021-12-31 (×4): qty 2

## 2021-12-31 MED ORDER — HYDRALAZINE HCL 50 MG PO TABS
100.0000 mg | ORAL_TABLET | Freq: Three times a day (TID) | ORAL | Status: DC
  Administered 2021-12-31 – 2022-01-01 (×2): 100 mg via ORAL
  Filled 2021-12-31 (×2): qty 2

## 2021-12-31 MED ORDER — ONDANSETRON HCL 4 MG/2ML IJ SOLN
4.0000 mg | Freq: Four times a day (QID) | INTRAMUSCULAR | Status: DC | PRN
  Administered 2022-01-01: 4 mg via INTRAVENOUS
  Filled 2021-12-31: qty 2

## 2021-12-31 MED ORDER — LABETALOL HCL 5 MG/ML IV SOLN
20.0000 mg | Freq: Once | INTRAVENOUS | Status: AC
  Administered 2021-12-31: 20 mg via INTRAVENOUS
  Filled 2021-12-31: qty 4

## 2021-12-31 MED ORDER — ATORVASTATIN CALCIUM 80 MG PO TABS
80.0000 mg | ORAL_TABLET | Freq: Every day | ORAL | Status: DC
  Administered 2021-12-31 – 2022-01-03 (×4): 80 mg via ORAL
  Filled 2021-12-31 (×3): qty 1
  Filled 2021-12-31: qty 2

## 2021-12-31 MED ORDER — FUROSEMIDE 10 MG/ML IJ SOLN
20.0000 mg | Freq: Three times a day (TID) | INTRAMUSCULAR | Status: DC
  Administered 2022-01-01: 20 mg via INTRAVENOUS
  Filled 2021-12-31: qty 2

## 2021-12-31 MED ORDER — ACETAMINOPHEN 325 MG PO TABS
650.0000 mg | ORAL_TABLET | Freq: Four times a day (QID) | ORAL | Status: DC | PRN
  Administered 2021-12-31 – 2022-01-01 (×3): 650 mg via ORAL
  Filled 2021-12-31 (×3): qty 2

## 2021-12-31 MED ORDER — DEXTROSE 10 % IV SOLN: INTRAVENOUS | Status: DC

## 2021-12-31 MED ORDER — HEPARIN SODIUM (PORCINE) 5000 UNIT/ML IJ SOLN
5000.0000 [IU] | Freq: Three times a day (TID) | INTRAMUSCULAR | Status: DC
  Administered 2021-12-31 – 2022-01-03 (×10): 5000 [IU] via SUBCUTANEOUS
  Filled 2021-12-31 (×11): qty 1

## 2021-12-31 MED ORDER — HYDRALAZINE HCL 25 MG PO TABS
25.0000 mg | ORAL_TABLET | Freq: Three times a day (TID) | ORAL | Status: DC
  Administered 2021-12-31: 25 mg via ORAL
  Filled 2021-12-31 (×2): qty 1

## 2021-12-31 MED ORDER — DEXTROSE 50 % IV SOLN
INTRAVENOUS | Status: AC
  Filled 2021-12-31: qty 50

## 2021-12-31 MED ORDER — OXYCODONE HCL 5 MG PO TABS
5.0000 mg | ORAL_TABLET | ORAL | Status: DC | PRN
  Administered 2021-12-31 – 2022-01-03 (×7): 5 mg via ORAL
  Filled 2021-12-31 (×7): qty 1

## 2021-12-31 MED ORDER — ACETAMINOPHEN 650 MG RE SUPP: 650.0000 mg | Freq: Four times a day (QID) | RECTAL | Status: DC | PRN

## 2021-12-31 MED ORDER — NITROGLYCERIN 0.4 MG SL SUBL
0.4000 mg | SUBLINGUAL_TABLET | SUBLINGUAL | Status: DC | PRN
  Administered 2022-01-01 (×3): 0.4 mg via SUBLINGUAL
  Filled 2021-12-31 (×2): qty 1

## 2021-12-31 MED ORDER — DEXTROSE 50 % IV SOLN
INTRAVENOUS | Status: AC
  Administered 2021-12-31: 50 mL via INTRAVENOUS
  Filled 2021-12-31: qty 50

## 2021-12-31 MED ORDER — HYDRALAZINE HCL 20 MG/ML IJ SOLN
10.0000 mg | INTRAMUSCULAR | Status: DC | PRN
  Administered 2021-12-31: 10 mg via INTRAVENOUS
  Filled 2021-12-31: qty 1

## 2021-12-31 MED ORDER — FUROSEMIDE 10 MG/ML IJ SOLN
40.0000 mg | Freq: Once | INTRAMUSCULAR | Status: AC
Start: 2021-12-31 — End: 2021-12-31
  Administered 2021-12-31: 40 mg via INTRAVENOUS
  Filled 2021-12-31: qty 4

## 2021-12-31 MED ORDER — AMLODIPINE BESYLATE 10 MG PO TABS
10.0000 mg | ORAL_TABLET | Freq: Every day | ORAL | Status: DC
  Administered 2021-12-31 – 2022-01-03 (×4): 10 mg via ORAL
  Filled 2021-12-31: qty 2
  Filled 2021-12-31 (×3): qty 1
  Filled 2021-12-31: qty 2

## 2021-12-31 MED ORDER — HYDRALAZINE HCL 25 MG PO TABS
50.0000 mg | ORAL_TABLET | Freq: Three times a day (TID) | ORAL | Status: DC
  Administered 2021-12-31: 50 mg via ORAL
  Filled 2021-12-31: qty 2

## 2021-12-31 MED ORDER — LABETALOL HCL 5 MG/ML IV SOLN
10.0000 mg | Freq: Once | INTRAVENOUS | Status: AC
  Administered 2021-12-31: 10 mg via INTRAVENOUS
  Filled 2021-12-31: qty 4

## 2021-12-31 MED ORDER — DOXAZOSIN MESYLATE 4 MG PO TABS
4.0000 mg | ORAL_TABLET | Freq: Every day | ORAL | Status: DC
  Administered 2021-12-31: 4 mg via ORAL
  Filled 2021-12-31 (×3): qty 1

## 2021-12-31 NOTE — Assessment & Plan Note (Signed)
Continue norvasc, hydralazine, imdur, nebivolol ?

## 2021-12-31 NOTE — ED Notes (Signed)
Pt's BP remains in the 180s following Iv lasix and home BP meds that was given at 5:34 am this morning by another RN. Admitting MD made aware. PRN hydralazine ordered. The goal is to maintain pt's systolic BP less than 568.  ?

## 2021-12-31 NOTE — Plan of Care (Signed)
  Problem: Nutrition: Goal: Adequate nutrition will be maintained Outcome: Progressing   Problem: Pain Managment: Goal: General experience of comfort will improve Outcome: Progressing   Problem: Safety: Goal: Ability to remain free from injury will improve Outcome: Progressing   

## 2021-12-31 NOTE — TOC Initial Note (Signed)
Transition of Care (TOC) - Initial/Assessment Note  ? ? ?Patient Details  ?Name: Carol [name] ?MRN: [id number] ?Date of Birth: 31-Aug-1960 ? ?Transition of Care (TOC) CM/SW Contact:    ?Verdell Carmine, RN ?Phone Number: ?12/31/2021, 1:34 PM ? ?Clinical Narrative:                 ?61 year old patient who presented with syncope, low blood sugar, fall with bruised left eye. Patient stated she fell in the bathroom. She has a ane and walker at home. She has had Home Health in the past with [facility], she is accepting of home health with them should she require it.  ?She has been a week without a glucometer, but still taking lantus. She apparently called the insurance company to send another one, but none has arrived. Will need a script for Glucometer strips and lancets post hospital.  ? ?Cm will follow for needs, recommendations, and transitions ? ?Expected Discharge Plan: [place] ?Barriers to Discharge: Continued Medical Work up ? ? ?Patient Goals and CMS Choice ?  ?  ?  ? ?Expected Discharge Plan and Services ?Expected Discharge Plan: [place] ?  ?Discharge Planning Services: CM Consult ?  ?Living arrangements for the past 2 months: Apartment ?                ?  ?  ?  ?  ?  ?  ?  ?  ?  ?  ? ?Prior Living Arrangements/Services ?Living arrangements for the past 2 months: Apartment ?  ?Patient language and need for interpreter reviewed:: Yes ?       ?Need for Family Participation in Patient Care: Yes (Comment) ?Care giver support system in place?: Yes (comment) ?Current home services: DME (Walker,  cane) ?Criminal Activity/Legal Involvement Pertinent to Current Situation/Hospitalization: No - Comment as needed ? ?Activities of Daily Living ?  ?  ? ?Permission Sought/Granted ?  ?  ?   ?   ?   ?   ? ?Emotional Assessment ?  ?Attitude/Demeanor/Rapport: Gracious ?Affect (typically observed): Stable ?Orientation: : Oriented to Self, Oriented to Place, Oriented to  Time, Oriented  to Situation ?Alcohol / Substance Use: Not Applicable ?Psych Involvement: No (comment) ? ?Admission diagnosis:  Diabetic hypoglycemia ([place]) [E11.649] ?Patient Active Problem List  ? Diagnosis Date Noted  ? Diabetic hypoglycemia ([place]) 12/31/2021  ? Stage 3b chronic kidney disease (CKD) ([place]) 12/31/2021  ? CAD in native artery 12/24/2021  ? Anxiety with depression 12/24/2021  ? Pure hypercholesterolemia   ? Colon cancer screening 02/18/2014  ? Diabetes mellitus ([place]) 01/29/2014  ? Essential hypertension 01/29/2014  ? Closed jaw fracture ([place]) 01/29/2014  ? 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 ?PCP:  Simona Huh, NP ?Pharmacy:   ?[facility] [place] ?[address] ?[place] [address] ?Phone: [phone] Fax: [phone] ? ?[id number] - [place], [place] ?[place] ?[place] [address] ?Phone: [phone] Fax: [phone] ? ? ? ? ?Social Determinants of Health (SDOH) Interventions ?  ? ?Readmission Risk Interventions ?   ? View : No data to display.  ?  ?  ?  ? ? ? ?

## 2021-12-31 NOTE — Assessment & Plan Note (Addendum)
Admit to observation medical bed. Hypoglycemia due to patient injecting herself with excessive amounts of lantus. Pt denies trying to harm herself. Pt states her glucometer has been broken for over 7 days. EMS had already been to her home once today for hypoglycemia and given her IV dextrose.  Despite having been treated for hypoglycemia, pt gave herself today's dose of lantus WITHOUT checking her CBG.  Pt needs extensive diabetic education as I do believe she is uneducated about the consequences/effects of insulin on her CBG/diabetes. Check A1c. ? ?Continue with D10W infusion. CBG qac/hs. Restart insulin after CBG consistently >200 and dextrose infusion has been shut off. ?

## 2021-12-31 NOTE — ED Notes (Signed)
RN informed hospitalist, Dr. Bridgett Larsson, of pt's ongoing severe hypertension following 0048 and 0200 doses of IV labetalol. BP currently 221/103. Per Dr. Bridgett Larsson, will give pt her 0600 dose of hydralazine, heparin injection, and blood pressure medications originally scheduled for 1000 this AM. RN called main pharmacy to explain situation and verbal order from provider, and requested that they send nebivolol dose now instead of 10 AM.  ?

## 2021-12-31 NOTE — ED Notes (Signed)
Pt's Bp remains in the 190s after the po hydralazine. Admitting MD made aware. Stated will order something else.  ?

## 2021-12-31 NOTE — Progress Notes (Signed)
No charge note ? ?Patient seen and examined this morning, admitted overnight, H&P reviewed and agree with the assessment and plan ? ?61 year old female with DM2, CAD, HTN, CKD 3B comes in today with hyperglycemia.  Her glucometer is not working for the past week and she is waiting to get a new one in the mail.  EMS was called the day prior to admission because she was feeling shaky, she was found to be hypoglycemic.  EMS treated her with IV dextrose.  Despite that episode, she continue taking her Lantus and EMS had to be called again, she was found to be hypoglycemic and brought to the hospital ? ?Hypoglycemia-due to home Lantus, also does not have a glucometer.  Poor insight into her medical condition. ? ?Hypertensive urgency-blood pressure remains significantly elevated this morning.  Admits that her home regimen earlier and continue to monitor ? ?CAD-continue medications as below, no chest pain ? ?Chronic diastolic CHF-most recent echo last week showed an EF of 65 to 88%, grade 1 diastolic dysfunction.  She is complaining of slightly worsening lower extremity swelling, will give Lasix x1 ? ?CKD 3B-creatinine stable ? ?BP (!) 198/106   Pulse 67   Temp (!) 96.4 ?F (35.8 ?C) (Temporal)   Resp 15   Ht 5\' 4"  (1.626 m)   Wt 77.1 kg   SpO2 100%   BMI 29.18 kg/m?  ? ? ?Scheduled Meds: ? amLODipine  10 mg Oral Daily  ? aspirin EC  81 mg Oral Daily  ? atorvastatin  80 mg Oral Daily  ? clopidogrel  75 mg Oral Daily  ? furosemide  40 mg Intravenous Once  ? heparin  5,000 Units Subcutaneous Q8H  ? hydrALAZINE  25 mg Oral Q8H  ? isosorbide mononitrate  30 mg Oral Daily  ? nebivolol  20 mg Oral Daily  ? ?Continuous Infusions: ? dextrose 150 mL/hr at 12/31/21 0534  ? ?PRN Meds:.acetaminophen **OR** acetaminophen, nitroGLYCERIN, ondansetron **OR** ondansetron (ZOFRAN) IV ? ?Ardie Mclennan M. Cruzita Lederer, MD, PhD ?[facility] ? ?Between 7 am - 7 pm you can contact me via Amion (for emergencies) or Securechat (non urgent  matters).  ?I am not available 7 pm - 7 am, please contact night coverage MD/APP via Amion ?

## 2021-12-31 NOTE — ED Notes (Signed)
Graham crackers, peanut butter, and another apple juice given to pt ?

## 2021-12-31 NOTE — Assessment & Plan Note (Signed)
Stable

## 2021-12-31 NOTE — H&P (Addendum)
?History and Physical  ? ? ?Carol [name] SEG:[id number] DOB: Oct 16, 1960 DOA: 12/30/2021 ? ?DOS: the patient was seen and examined on 12/30/2021 ? ?PCP: Simona Huh, NP  ? ?Patient coming from: Home ? ?I have personally briefly reviewed patient's old medical records in [place] ? ?CC: hypoglycemia ?HPI: ?60 year old African-American female history of type 2 diabetes, coronary disease, hypertension, CKD stage IIIb presents the ER today with repeated hypoglycemia.  Patient states that she called EMS Dr. Edwinna Areola today because she was feeling shaky.  She was noted to be hypoglycemic.  She does not know what her blood sugar was.  EMS treated her with an IV and IV dextrose.  She states that her blood sugar meter has been broken for at least a week now.  She states that she called her insurance company who is going to mail her a new glucometer but this is yet to be sent to her. ? ?Despite being told that she was hypoglycemic, the patient took her daily dose of Lantus this evening.  Patient again became hypoglycemic called EMS again and was taken to the ER for evaluation. ? ?Reportedly her initial CBG was 41.  Then 31.  IM glucagon was given. ? ?Patient states that she did not go out and buy herself a new meter because she can get her free 1 from her insurance company. ? ?She can afford cigarettes and beer every day. ? ?[facility] hospital[facility] contacted for admission due to the patient needing a dextrose infusion to keep her blood sugar euglycemic.  ? ?ED Course: continued hypoglycemia. Started on D10W infusion ? ?Review of Systems:  ?Review of Systems  ?Constitutional: Negative.   ?HENT: Negative.    ?Eyes: Negative.   ?Respiratory: Negative.    ?Cardiovascular: Negative.   ?Gastrointestinal: Negative.   ?Genitourinary: Negative.   ?Musculoskeletal:  Positive for falls.  ?Skin: Negative.   ?Neurological: Negative.   ?Endo/Heme/Allergies: Negative.   ?     Hypoglycemia  ?Psychiatric/Behavioral: Negative.    ? ?Past  Medical History:  ?Diagnosis Date  ? Abnormal liver function tests   ? Acute kidney injury ([place]) 01/2014  ? Hospitalized, Volume depletion, nausea and vomiting  ? Anxiety   ? 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 Chronic back pain   ? Depression   ? Diabetes mellitus   ? GERD (gastroesophageal reflux disease)   ? Heart murmur   ? Hypertension   ? NSTEMI (non-ST elevated myocardial infarction) ([place]) 12/20/2021  ? Palpitations   ? Parkinson's disease ([place])   ? ? ?Past Surgical History:  ?Procedure Laterality Date  ? CARPAL TUNNEL RELEASE    ? LEFT HEART CATH AND CORONARY ANGIOGRAPHY N/A 12/20/2021  ? Procedure: LEFT HEART CATH AND CORONARY ANGIOGRAPHY;  Surgeon: Burnell Blanks, MD;  Location: [facility];  Service: Cardiovascular;  Laterality: N/A;  ? OPEN REDUCTION INTERNAL FIXATION (ORIF) FINGER WITH RADIAL BONE GRAFT Right 09/12/2015  ? Procedure: RIGHT MIDDLE FINGER CLOSED REDUCTION AND PINNING;  Surgeon: Iran Planas, MD;  Location: [facility];  Service: Orthopedics;  Laterality: Right;  ? TONSILLECTOMY    ? TUBAL LIGATION    ? ? ? reports that she has been smoking cigarettes. She has a 0.75 pack-year smoking history. She has never used smokeless tobacco. She reports current alcohol use. She reports that she does not use drugs. ? ?Allergies  ?Allergen Reactions  ? Penicillins Shortness Of Breath and Other (See Comments)  ?  Caused yeast infection ?Has patient had  a PCN reaction causing immediate rash, facial/tongue/throat swelling, SOB or lightheadedness with hypotension: Yes ?Has patient had a PCN reaction causing severe rash involving mucus membranes or skin necrosis: No ?Has patient had a PCN reaction that required hospitalization pt was in the hospital at the time of the reaction ?Has patient had a PCN reaction occurring within the last 10 years: No ?If all of the above answers are "NO", then may proceed with Cephalosp  ? Tramadol Other (See Comments)  ?  unknown  ? ? ?Family History   ?Problem Relation Age of Onset  ? Diabetes Mother   ? Kidney disease Mother   ? Seizures Father   ? Hypertension Father   ? Stroke Father   ? Asthma Other   ? ? ?Prior to Admission medications   ?Medication Sig Start Date End Date Taking? Authorizing Provider  ?albuterol (PROVENTIL HFA;VENTOLIN HFA) 108 (90 Base) MCG/ACT inhaler Inhale 2 puffs into the lungs every 6 (six) hours as needed for wheezing or shortness of breath.    [provider]  ?ALPRAZolam Duanne Moron) 1 MG tablet Take 1 mg by mouth 2 (two) times daily as needed for anxiety.  09/02/15   [provider]  ?amLODipine (NORVASC) 10 MG tablet Take 1 tablet (10 mg total) by mouth daily. 11/26/21   Varney Biles, MD  ?aspirin EC 81 MG EC tablet Take 1 tablet (81 mg total) by mouth daily. Swallow whole. 12/25/21   Dunn, Nedra Hai, PA-C  ?atorvastatin (LIPITOR) 80 MG tablet Take 1 tablet (80 mg total) by mouth daily. 12/25/21   Dunn, Nedra Hai, PA-C  ?budesonide-formoterol (SYMBICORT) 160-4.5 MCG/ACT inhaler Inhale 2 puffs into the lungs in the morning and at bedtime. 11/09/21   [provider]  ?Buprenorphine HCl-Naloxone HCl 4-1 MG FILM Place 0.5 strips under the tongue daily as needed (narcotic dependance). 08/01/21   [provider]  ?cetirizine (ZYRTEC) 10 MG tablet Take 10 mg by mouth daily as needed for allergies.    [provider]  ?clopidogrel (PLAVIX) 75 MG tablet Take 1 tablet (75 mg total) by mouth daily. 12/25/21   Dunn, Nedra Hai, PA-C  ?cyclobenzaprine (FLEXERIL) 10 MG tablet Take 1 tablet (10 mg total) by mouth 3 (three) times daily as needed for muscle spasms. 05/18/19   Domenic Moras, PA-C  ?diphenhydramine-acetaminophen (TYLENOL PM) 25-500 MG TABS Take 2 tablets by mouth at bedtime as needed (For sleeping disorder.).     [provider]  ?ergocalciferol (VITAMIN D2) 1.25 MG (50000 UT) capsule Take 50,000 Units by mouth every Wednesday.    [provider]  ?gabapentin (NEURONTIN) 100 MG capsule Take  100 mg by mouth 3 (three) times daily.    [provider]  ?hydrALAZINE (APRESOLINE) 25 MG tablet Take 1 tablet (25 mg total) by mouth every 8 (eight) hours. 12/24/21   Dunn, Nedra Hai, PA-C  ?hydrOXYzine (ATARAX) 50 MG tablet Take 50 mg by mouth 3 (three) times daily as needed for anxiety or itching.    [provider]  ?insulin aspart (NOVOLOG) 100 UNIT/ML FlexPen Inject 16 Units into the skin 2 (two) times daily.    [provider]  ?insulin glargine (LANTUS) 100 UNIT/ML injection Inject 60 Units into the skin daily.    [provider]  ?isosorbide mononitrate (IMDUR) 30 MG 24 hr tablet Take 1 tablet (30 mg total) by mouth daily. 12/25/21   Charlie Pitter, PA-C  ?Nebivolol HCl 20 MG TABS Take 20 mg by mouth daily. 12/07/21  [provider]  ?nitroGLYCERIN (NITROSTAT) 0.4 MG SL tablet Place 1 tablet (0.4 mg total) under the tongue every 5 (five) minutes as needed for chest pain (up to 3 doses). 12/24/21   Dunn, Nedra Hai, PA-C  ?oxyCODONE-acetaminophen (PERCOCET/ROXICET) 5-325 MG tablet Take 1-2 tablets by mouth every 6 (six) hours as needed for severe pain. 08/31/15   Cartner, Marland Kitchen, PA-C  ?pantoprazole (PROTONIX) 40 MG tablet Take 1 tablet (40 mg total) by mouth daily at 12 noon. ?Patient taking differently: Take 40 mg by mouth daily as needed (acid reflux). 02/02/14   Regalado, Belkys A, MD  ?QUEtiapine (SEROQUEL XR) 300 MG 24 hr tablet Take 300 mg by mouth at bedtime. 12/07/21   [provider]  ?sertraline (ZOLOFT) 50 MG tablet Take 50 mg by mouth daily.    [provider]  ?triamcinolone ointment (KENALOG) 0.1 % Apply 1 application topically 2 (two) times daily as needed (Apply to feet after showering for dry skin.).     [provider]  ? ? ?Physical Exam: ?Vitals:  ? 12/30/21 2339 12/31/21 0015 12/31/21 0100 12/31/21 0115  ?BP: (!) 230/112  (!) 205/107 (!) 200/101  ?Pulse: 72 70 71 70  ?Resp: 19 12 (!) 22 19  ?Temp: (!) 96.4 ?F (35.8 ?C)      ?TempSrc: Temporal     ?SpO2: 100% 100% 100% 100%  ?Weight: 77.1 kg     ?Height: 5\' 4"  (1.626 m)     ? ? ?Physical Exam ?Vitals and nursing note reviewed.  ?Constitutional:   ?   General: She is not in acut

## 2021-12-31 NOTE — ED Notes (Signed)
RN irrigated laceration on pt's left eyelid and applied dermabond to site ?

## 2021-12-31 NOTE — ED Notes (Signed)
Pt's CBG dropped down to 53. Orange juice provided. Admitting MD made aware. ?

## 2021-12-31 NOTE — Subjective & Objective (Signed)
CC: hypoglycemia ?HPI: ?61 year old African-American female history of type 2 diabetes, coronary disease, hypertension, CKD stage IIIb presents the ER today with repeated hypoglycemia.  Patient states that she called EMS Dr. Edwinna Areola today because she was feeling shaky.  She was noted to be hypoglycemic.  She does not know what her blood sugar was.  EMS treated her with an IV and IV dextrose.  She states that her blood sugar meter has been broken for at least a week now.  She states that she called her insurance company who is going to mail her a new glucometer but this is yet to be sent to her. ? ?Despite being told that she was hypoglycemic, the patient took her daily dose of Lantus this evening.  Patient again became hypoglycemic called EMS again and was taken to the ER for evaluation. ? ?Reportedly her initial CBG was 41.  Then 31.  IM glucagon was given. ? ?Patient states that she did not go out and buy herself a new meter because she can get her free 1 from her insurance company. ? ?She can afford cigarettes and beer every day. ? ?[facility] contacted for admission due to the patient needing a dextrose infusion to keep her blood sugar euglycemic. ?

## 2021-12-31 NOTE — Assessment & Plan Note (Signed)
Continue ASA, lipitor, nebivolol ?

## 2021-12-31 NOTE — Plan of Care (Signed)
?  Problem: Nutrition: ?Goal: Adequate nutrition will be maintained ?12/31/2021 2004 by Derrill Kay, RN ?Outcome: Progressing ?12/31/2021 2003 by Derrill Kay, RN ?Outcome: Progressing ?  ?Problem: Safety: ?Goal: Ability to remain free from injury will improve ?12/31/2021 2004 by Derrill Kay, RN ?Outcome: Progressing ?12/31/2021 2003 by Derrill Kay, RN ?Outcome: Progressing ?  ?Problem: Pain Managment: ?Goal: General experience of comfort will improve ?12/31/2021 2004 by Derrill Kay, RN ?Outcome: Progressing ?12/31/2021 2003 by Derrill Kay, RN ?Outcome: Progressing ?  ?

## 2021-12-31 NOTE — ED Notes (Signed)
ED provider informed of pt troponin 182 ?

## 2022-01-01 ENCOUNTER — Inpatient Hospital Stay (HOSPITAL_COMMUNITY): Payer: Medicare Other

## 2022-01-01 DIAGNOSIS — E11649 Type 2 diabetes mellitus with hypoglycemia without coma: Secondary | ICD-10-CM | POA: Diagnosis not present

## 2022-01-01 LAB — BASIC METABOLIC PANEL
Anion gap: 10 (ref 5–15)
BUN: 13 mg/dL (ref 6–20)
CO2: 19 mmol/L — ABNORMAL LOW (ref 22–32)
Calcium: 8.3 mg/dL — ABNORMAL LOW (ref 8.9–10.3)
Chloride: 102 mmol/L (ref 98–111)
Creatinine, Ser: 2.03 mg/dL — ABNORMAL HIGH (ref 0.44–1.00)
GFR, Estimated: 28 mL/min — ABNORMAL LOW (ref >60)
Glucose, Bld: 268 mg/dL — ABNORMAL HIGH (ref 70–99)
Potassium: 4.1 mmol/L (ref 3.5–5.1)
Sodium: 131 mmol/L — ABNORMAL LOW (ref 135–145)

## 2022-01-01 LAB — GLUCOSE, CAPILLARY
Glucose-Capillary: 193 mg/dL — ABNORMAL HIGH (ref 70–99)
Glucose-Capillary: 209 mg/dL — ABNORMAL HIGH (ref 70–99)
Glucose-Capillary: 232 mg/dL — ABNORMAL HIGH (ref 70–99)
Glucose-Capillary: 232 mg/dL — ABNORMAL HIGH (ref 70–99)
Glucose-Capillary: 248 mg/dL — ABNORMAL HIGH (ref 70–99)
Glucose-Capillary: 249 mg/dL — ABNORMAL HIGH (ref 70–99)
Glucose-Capillary: 254 mg/dL — ABNORMAL HIGH (ref 70–99)
Glucose-Capillary: 267 mg/dL — ABNORMAL HIGH (ref 70–99)
Glucose-Capillary: 306 mg/dL — ABNORMAL HIGH (ref 70–99)

## 2022-01-01 MED ORDER — FUROSEMIDE 10 MG/ML IJ SOLN
40.0000 mg | Freq: Two times a day (BID) | INTRAMUSCULAR | Status: DC
  Administered 2022-01-01 – 2022-01-02 (×2): 40 mg via INTRAVENOUS
  Filled 2022-01-01 (×2): qty 4

## 2022-01-01 MED ORDER — INSULIN ASPART 100 UNIT/ML IJ SOLN
0.0000 [IU] | Freq: Three times a day (TID) | INTRAMUSCULAR | Status: DC
  Administered 2022-01-01: 3 [IU] via SUBCUTANEOUS
  Administered 2022-01-01: 2 [IU] via SUBCUTANEOUS
  Administered 2022-01-02: 7 [IU] via SUBCUTANEOUS
  Administered 2022-01-02: 2 [IU] via SUBCUTANEOUS
  Administered 2022-01-02: 5 [IU] via SUBCUTANEOUS
  Administered 2022-01-03: 1 [IU] via SUBCUTANEOUS
  Administered 2022-01-03: 3 [IU] via SUBCUTANEOUS

## 2022-01-01 MED ORDER — FUROSEMIDE 10 MG/ML IJ SOLN
60.0000 mg | Freq: Once | INTRAMUSCULAR | Status: AC
Start: 2022-01-01 — End: 2022-01-01
  Administered 2022-01-01: 60 mg via INTRAVENOUS
  Filled 2022-01-01: qty 6

## 2022-01-01 MED ORDER — ALPRAZOLAM 0.5 MG PO TABS
0.5000 mg | ORAL_TABLET | Freq: Three times a day (TID) | ORAL | Status: DC | PRN
  Administered 2022-01-01 – 2022-01-03 (×5): 0.5 mg via ORAL
  Filled 2022-01-01 (×5): qty 1

## 2022-01-01 MED ORDER — INSULIN GLARGINE-YFGN 100 UNIT/ML ~~LOC~~ SOLN
10.0000 [IU] | Freq: Every day | SUBCUTANEOUS | Status: DC
  Administered 2022-01-01 – 2022-01-03 (×3): 10 [IU] via SUBCUTANEOUS
  Filled 2022-01-01 (×4): qty 0.1

## 2022-01-01 MED ORDER — INSULIN ASPART 100 UNIT/ML IJ SOLN
0.0000 [IU] | Freq: Every day | INTRAMUSCULAR | Status: DC
  Administered 2022-01-01: 4 [IU] via SUBCUTANEOUS

## 2022-01-01 MED ORDER — HYDRALAZINE HCL 50 MG PO TABS
50.0000 mg | ORAL_TABLET | Freq: Three times a day (TID) | ORAL | Status: DC
Start: 2022-01-01 — End: 2022-01-03
  Administered 2022-01-01 – 2022-01-03 (×6): 50 mg via ORAL
  Filled 2022-01-01 (×7): qty 1

## 2022-01-01 NOTE — Progress Notes (Addendum)
Inpatient Diabetes Program Recommendations ? ?AACE/ADA: New Consensus Statement on Inpatient Glycemic Control (2015) ? ?Target Ranges:  Prepandial:   less than 140 mg/dL ?     Peak postprandial:   less than 180 mg/dL (1-2 hours) ?     Critically ill patients:  140 - 180 mg/dL  ? ?Lab Results  ?Component Value Date  ? GLUCAP 232 (H) 01/01/2022  ? HGBA1C 6.4 (H) 12/30/2021  ? ? ?Review of Glycemic Control ? ?Diabetes history: DM2 ?Outpatient Diabetes medications: Lantus 60 units QHS, Novolog 12-16 units TID with meals ?Current orders for Inpatient glycemic control: Semglee 10 units QD, Novolog 0-9 units TID ? ?Spoke with patient and spouse at bedside.  She is very sleepy; answers questions but takes awhile to respond.  Husband participated in conversation.  He states when they were discharged last week they were told not to take insulin.  She has not been checking her blood sugar because her meter is broken.   ? ?She states she had several episodes of hypoglycemia side effects on Saturday.  She administered her Lantus and Novolog on Saturday for the first time since she discharged.  Spouse called [phone] when she fell.  Glucagon was administered via EMS.   ? ?Educated them on importance of having a functioning glucometer at all times to check CBGs.  Asked if they would be interested in a CGM/Freestyle Libre at discharge.  They are very interested in having one placed prior to DC.  Will ask MD for order.   ? ?Current with PCP.  Will likely need decreased insulin dosing at discharge.   ? ?At DC please order glucometer # [id number]. ? ?Will continue to follow while inpatient. ? ?Thank you, ?Reche Dixon, MSN, CDCES ?Diabetes Coordinator ?Inpatient Diabetes Program ?[phone] (team pager from 8a-5p) ? ? ? ? ? ?

## 2022-01-01 NOTE — Plan of Care (Signed)
  Problem: Education: Goal: Knowledge of General Education information will improve Description: Including pain rating scale, medication(s)/side effects and non-pharmacologic comfort measures Outcome: Progressing   Problem: Clinical Measurements: Goal: Ability to maintain clinical measurements within normal limits will improve Outcome: Progressing   Problem: Activity: Goal: Risk for activity intolerance will decrease Outcome: Progressing   Problem: Nutrition: Goal: Adequate nutrition will be maintained Outcome: Progressing   Problem: Coping: Goal: Level of anxiety will decrease Outcome: Progressing   Problem: Elimination: Goal: Will not experience complications related to bowel motility Outcome: Progressing   

## 2022-01-01 NOTE — Progress Notes (Signed)
?PROGRESS NOTE ? ?Carol [name] WJX:[id number] DOB: [birth year] DOA: 12/30/2021 ?PCP: Simona Huh, NP ? ? LOS: 1 day  ? ?Brief Narrative / Interim history: ?61 year old female with DM2, CAD, HTN, CKD 3B comes in today with hyperglycemia.  Her glucometer is not working for the past week and she is waiting to get a new one in the mail.  EMS was called the day prior to admission because she was feeling shaky, she was found to be hypoglycemic.  EMS treated her with IV dextrose.  Despite that episode, she continue taking her Lantus and EMS had to be called again, she was found to be hypoglycemic and brought to the hospital ? ?Subjective / 24h Interval events: ?Complains of significant leg and arm swelling, also complains of shortness of breath ? ?Assesement and Plan: ?Principal Problem: ?  Diabetic hypoglycemia ([place]) ?Active Problems: ?  Essential hypertension ?  CAD in native artery ?  Stage 3b chronic kidney disease (CKD) ([place]) ? ?Principal problem ?Acute hypoxic respiratory failure due to fluid overload-likely disease in the setting of her requiring dextrose infusion for hypoglycemia.  Chest x-ray this morning with mild fluid overload, image personally reviewed.  Most recent 2D echo done 2 weeks ago showed normal EF 60-65%.  Placed on Lasix today, continue ? ?Active problems ?Hypoglycemia with underlying insulin-dependent type 2 diabetes mellitus-A1c 6.4.  Patient does not have a glucometer at home.  She appears to have poor insight into her medical condition.  Despite knowing that she was hypoglycemic earlier, as described above, she took her insulin and did not check her CBG.  Hypoglycemia now resolved, she is off the dextrose infusion.  Resume Lantus at a much lower dose, placed on sliding scale.  Continue to monitor ?  ?Hypertensive urgency-on admission, blood pressure was in the 213Y systolic.  Her hydralazine was increased, blood pressure better.  Receiving Lasix now ? ?CAD-continue medications as  below, no chest pain.  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  She is on aspirin, Plavix, hydralazine, Imdur ?  ?Chronic diastolic CHF-most recent echo last week showed an EF of 65 to 65%.  Lasix as above for iatrogenic edema due to IV fluids due to hypoglycemia ?  ?CKD 3B- baseline creatinine ranging between 1.7-2.3 on discharge last week.  Currently at 2.0, appears stable.  Monitor with diuresis ? ?Anxiety-continue home Xanax as needed ? ?UDS positive for cocaine-we will discuss with patient, she will need complete cessation ? ? ?Scheduled Meds: ? amLODipine  10 mg Oral Daily  ? aspirin EC  81 mg Oral Daily  ? atorvastatin  80 mg Oral Daily  ? clopidogrel  75 mg Oral Daily  ? heparin  5,000 Units Subcutaneous Q8H  ? hydrALAZINE  50 mg Oral Q8H  ? insulin aspart  0-5 Units Subcutaneous QHS  ? insulin aspart  0-9 Units Subcutaneous TID WC  ? insulin glargine-yfgn  10 Units Subcutaneous Daily  ? isosorbide mononitrate  30 mg Oral Daily  ? nebivolol  20 mg Oral Daily  ? ?Continuous Infusions: ?PRN Meds:.acetaminophen **OR** acetaminophen, ALPRAZolam, hydrALAZINE, nitroGLYCERIN, ondansetron **OR** ondansetron (ZOFRAN) IV, oxyCODONE ? ?Diet Orders (From admission, onward)  ? ?  Start     Ordered  ? 12/31/21 0205  Diet regular Room service appropriate? Yes; Fluid consistency: Thin  Diet effective now       ?  Question Answer Comment  ?Room service appropriate? Yes   ?Fluid consistency: Thin   ?  ? 12/31/21 0204  ? ?  ?  ? ?  ? ? ?DVT prophylaxis: heparin injection 5,000 Units Start: 12/31/21 0600 ?SCDs Start: 12/31/21 0205 ? ? ?Lab Results  ?Component Value Date  ? PLT 221 12/30/2021  ? ? ?  Code Status: Full Code ? ?Family Communication: s/o at bedside  ? ?Status is: Inpatient ? ?Remains inpatient appropriate because: Persistent shortness of  breath ? ?Level of care: Progressive ? ?Consultants:  ?None ? ?Procedures:  ?none ? ?Microbiology  ?none ? ?Antimicrobials: ?none  ? ? ?Objective: ?Vitals:  ? 12/31/21 2141 01/01/22 0557 01/01/22 0600 01/01/22 0800  ?BP: (!) 151/80 (!) 147/76  (!) 171/87  ?Pulse: 65   73  ?Resp:    20  ?Temp: 98.5 ?F (36.9 ?C)  (!) 97.5 ?F (36.4 ?C) 98.3 ?F (36.8 ?C)  ?TempSrc: Oral  Axillary Oral  ?SpO2: 98%   100%  ?Weight:      ?Height:      ? ? ?Intake/Output Summary (Last 24 hours) at 01/01/2022 1121 ?Last data filed at 12/31/2021 2300 ?Gross per 24 hour  ?Intake --  ?Output 1400 ml  ?Net -1400 ml  ? ?Wt Readings from Last 3 Encounters:  ?12/30/21 77.1 kg  ?12/25/21 77.1 kg  ?12/21/21 90.3 kg  ? ? ?Examination: ? ?Constitutional: NAD ?Eyes: no scleral icterus ?ENMT: Mucous membranes are moist.  ?Neck: normal, supple ?Respiratory: Bibasilar crackles, tachypneic, increased respiratory effort ?Cardiovascular: Regular rate and rhythm, no murmurs / rubs / gallops.  1+ pitting lower extremity edema ?Abdomen: non distended, no tenderness. Bowel sounds positive.  ?Musculoskeletal: no clubbing / cyanosis.  ?Skin: no rashes ?Neurologic: Nonfocal ? ?Data Reviewed: I have independently reviewed following labs and imaging studies  ? ?CBC ?Recent Labs  ?Lab 12/30/21 ?2342  ?WBC 6.2  ?HGB 10.5*  ?HCT 32.9*  ?PLT 221  ?MCV 94.3  ?MCH 30.1  ?MCHC 31.9  ?RDW 13.2  ?LYMPHSABS 1.1  ?MONOABS 0.6  ?EOSABS 0.1  ?BASOSABS 0.0  ? ? ?Recent Labs  ?Lab 12/30/21 ?2342 01/01/22 ?0827  ?NA 137 131*  ?K 3.6 4.1  ?CL 111 102  ?CO2 20* 19*  ?GLUCOSE 62* 268*  ?BUN 14 13  ?CREATININE 2.16* 2.03*  ?CALCIUM 8.7* 8.3*  ?AST 31  --   ?ALT 15  --   ?ALKPHOS 127*  --   ?BILITOT 0.5  --   ?ALBUMIN 2.6*  --   ?HGBA1C 6.4*  --   ? ? ?------------------------------------------------------------------------------------------------------------------ ?No results for input(s): CHOL, HDL, LDLCALC, TRIG, CHOLHDL, LDLDIRECT in the last 72 hours. ? ?Lab Results  ?Component Value  Date  ? HGBA1C 6.4 (H) 12/30/2021  ? ?------------------------------------------------------------------------------------------------------------------ ?No results for input(s): TSH, T4TOTAL, T3FREE, THYROIDAB in the last 72 hours. ? ?Invalid input(s): FREET3 ? ?Cardiac Enzymes ?No results for input(s): CKMB, TROPONINI, MYOGLOBIN in the last 168 hours. ? ?Invalid input(s): CK ?------------------------------------------------------------------------------------------------------------------ ?   ?Component Value Date/Time  ? BNP 202.9 (H) 12/20/2021 0526  ? ? ?CBG: ?Recent Labs  ?Lab 01/01/22 ?1638 01/01/22 ?4665 01/01/22 ?0601 01/01/22 ?0809 01/01/22 ?1007  ?GLUCAP 232* 248* 267* 254* 249*  ? ? ?No results found for this or any previous visit (from the past 240 hour(s)).  ? ?Radiology Studies: ?DG CHEST PORT 1 VIEW ? ?Result Date: 01/01/2022 ?CLINICAL DATA:  Dyspnea. EXAM: PORTABLE CHEST 1 VIEW COMPARISON:  Chest x-ray dated Dec 30, 2021. FINDINGS: Stable cardiomediastinal silhouette. Unchanged mild diffuse  interstitial thickening. Slightly increased hazy density at the left lung base likely reflecting atelectasis and layering small pleural effusion. Unchanged small right pleural effusion. No pneumothorax. No acute osseous abnormality. IMPRESSION: 1. Slightly worsened left basilar atelectasis and small layering pleural effusion. Otherwise unchanged mild congestive heart failure. Electronically Signed   By: Titus Dubin M.D.   On: 01/01/2022 08:33   ? ? ?Marzetta Board, MD, PhD ?[facility] ? ?Between 7 am - 7 pm I am available, please contact me via Amion (for emergencies) or Securechat (non urgent messages) ? ?Between 7 pm - 7 am I am not available, please contact night coverage MD/APP via Amion ? ?

## 2022-01-02 DIAGNOSIS — E11649 Type 2 diabetes mellitus with hypoglycemia without coma: Secondary | ICD-10-CM | POA: Diagnosis not present

## 2022-01-02 DIAGNOSIS — I2510 Atherosclerotic heart disease of native coronary artery without angina pectoris: Secondary | ICD-10-CM | POA: Diagnosis not present

## 2022-01-02 DIAGNOSIS — F1410 Cocaine abuse, uncomplicated: Secondary | ICD-10-CM | POA: Diagnosis not present

## 2022-01-02 LAB — BASIC METABOLIC PANEL
Anion gap: 7 (ref 5–15)
BUN: 19 mg/dL (ref 6–20)
CO2: 22 mmol/L (ref 22–32)
Calcium: 8.6 mg/dL — ABNORMAL LOW (ref 8.9–10.3)
Chloride: 104 mmol/L (ref 98–111)
Creatinine, Ser: 2.4 mg/dL — ABNORMAL HIGH (ref 0.44–1.00)
GFR, Estimated: 23 mL/min — ABNORMAL LOW (ref >60)
Glucose, Bld: 178 mg/dL — ABNORMAL HIGH (ref 70–99)
Potassium: 4.3 mmol/L (ref 3.5–5.1)
Sodium: 133 mmol/L — ABNORMAL LOW (ref 135–145)

## 2022-01-02 LAB — GLUCOSE, CAPILLARY
Glucose-Capillary: 269 mg/dL — ABNORMAL HIGH (ref 70–99)
Glucose-Capillary: 313 mg/dL — ABNORMAL HIGH (ref 70–99)
Glucose-Capillary: 199 mg/dL — ABNORMAL HIGH (ref 70–99)
Glucose-Capillary: 175 mg/dL — ABNORMAL HIGH (ref 70–99)

## 2022-01-02 MED ORDER — FUROSEMIDE 40 MG PO TABS
40.0000 mg | ORAL_TABLET | Freq: Two times a day (BID) | ORAL | Status: DC
  Administered 2022-01-02 – 2022-01-03 (×3): 40 mg via ORAL
  Filled 2022-01-02 (×3): qty 1

## 2022-01-02 NOTE — Progress Notes (Signed)
SATURATION QUALIFICATIONS: (This note is used to comply with regulatory documentation for home oxygen)  Patient Saturations on Room Air at Rest = 96%  Patient Saturations on Room Air while Ambulating = 96%   

## 2022-01-02 NOTE — Plan of Care (Signed)
?  Problem: Health Behavior/Discharge Planning: ?Goal: Ability to manage health-related needs will improve ?Outcome: Progressing ?  ?Problem: Clinical Measurements: ?Goal: Respiratory complications will improve ?Outcome: Progressing ?Goal: Cardiovascular complication will be avoided ?Outcome: Progressing ?  ?

## 2022-01-02 NOTE — Progress Notes (Addendum)
Inpatient Diabetes Program Recommendations ? ?AACE/ADA: New Consensus Statement on Inpatient Glycemic Control (2015) ? ?Target Ranges:  Prepandial:   less than 140 mg/dL ?     Peak postprandial:   less than 180 mg/dL (1-2 hours) ?     Critically ill patients:  140 - 180 mg/dL  ? ?Lab Results  ?Component Value Date  ? GLUCAP 313 (H) 01/02/2022  ? HGBA1C 6.4 (H) 12/30/2021  ? ? ?Review of Glycemic Control ? Latest Reference Range & Units 01/01/22 08:09 01/01/22 10:07 01/01/22 12:42 01/01/22 14:42 01/01/22 16:30 01/01/22 20:33 01/02/22 08:20 01/02/22 11:54  ?Glucose-Capillary 70 - 99 mg/dL 254 (H) 249 (H) 232 (H) 209 (H) 193 (H) 306 (H) 269 (H) 313 (H)  ? ?Diabetes history: DM2 ?Outpatient Diabetes medications: Lantus 60 units QHS, Novolog 12-16 units TID with meals ?Current orders for Inpatient glycemic control: Semglee 10 units QD, Novolog 0-9 units TID + hs scale ? ?Inpatient Diabetes Program Recommendations:   ? ?-   Consider adding Novolog 2 units tid meal coverage if eating >50% of meals ? ?-   May need a small increase in basal insulin as well to Semglee 12 units. ? ?Thanks, ? ?Tama Headings RN, MSN, BC-ADM ?Inpatient Diabetes Coordinator ?Team Pager [phone] (8a-5p) ?

## 2022-01-02 NOTE — Progress Notes (Signed)
Mobility Specialist Progress Note  ? ? 01/02/22 1145  ?Mobility  ?Activity Ambulated independently in hallway  ?Level of Assistance Modified independent, requires aide device or extra time  ?Assistive Device Front wheel walker  ?Distance Ambulated (ft) 400 ft  ?Activity Response Tolerated well  ?$Mobility charge 1 Mobility  ? ?Pre-Mobility: 68 HR, 98% SpO2 ?During Mobility: HR, 96% SpO2 ?Post-Mobility: 69 HR, 143/72 BP, 98% SpO2 ? ?Pt received in bed and agreeable. No complaints on walk. Ambulated on RA. Returned to bed with call bell in reach.   ? ?Hildred Alamin ?Mobility Specialist  ?Primary: 5N M.S. Phone: [phone] ?Secondary: 6N M.S. Phone: [phone] ?  ?

## 2022-01-02 NOTE — Progress Notes (Signed)
?PROGRESS NOTE ? ?Carol [name] PZW:[id number] DOB: October 29, 1960 DOA: 12/30/2021 ?PCP: Simona Huh, NP ? ? LOS: 2 days  ? ?Brief Narrative / Interim history: ?61 year old female with DM2, CAD, HTN, CKD 3B comes in today with hyperglycemia.  Her glucometer is not working for the past week and she is waiting to get a new one in the mail.  EMS was called the day prior to admission because she was feeling shaky, she was found to be hypoglycemic.  EMS treated her with IV dextrose.  Despite that episode, she continue taking her Lantus and EMS had to be called again, she was found to be hypoglycemic and brought to the hospital ? ?Subjective / 24h Interval events: ?Breathing is improved this morning.  Appreciates her legs continue to remain swollen ? ?Assesement and Plan: ?Principal Problem: ?  Diabetic hypoglycemia ([place]) ?Active Problems: ?  Essential hypertension ?  CAD in native artery ?  Stage 3b chronic kidney disease (CKD) ([place]) ?  Cocaine abuse ([place]) ? ?Principal problem ?Acute hypoxic respiratory failure due to fluid overload in the setting of chronic kidney disease-fluid overload iatrogenic, likely in the setting of her requiring dextrose infusion for hypoglycemia.  Chest x-ray 5/15 with mild fluid overload, image personally reviewed.  Most recent 2D echo done 2 weeks ago showed normal EF 60-65%.  She was placed on IV Lasix, good results, attempt to wean off to room air today.  Lower extremity swelling still persistent but improved.  She states that she has not established with nephrology yet, has been dealing with lower extremity swelling for a while.  Given slight bump in creatinine discontinue IV Lasix and placed on p.o.  If she continues to have good urine output with stable renal function will be discharged with p.o. Lasix in the next day to see nephrology as an outpatient ? ?Active problems ?Hypoglycemia with underlying insulin-dependent type 2 diabetes mellitus-A1c 6.4.  Patient does not have a  glucometer at home.  She appears to have poor insight into her medical condition.  Despite knowing that she was hypoglycemic earlier, as described above, she took her insulin and did not check her CBG.  Hypoglycemia now resolved, she is off the dextrose infusion.  Lantus was resumed at 10 units, no further hypoglycemic episodes.  Continue to monitor and adjust as indicated. ?  ?Hypertensive urgency-on admission, blood pressure was in the 423N systolic.  Her hydralazine was increased, blood pressure better.  Add Lasix p.o. to her home regimen today and monitor ? ?CAD-continue medications as below, no chest pain.  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  She is on aspirin, Plavix, hydralazine, Imdur.  Continue, chest pain-free ?  ?Chronic diastolic CHF-most recent echo last week showed an EF of 65 to 65%.  Lasix as above for iatrogenic edema due to IV fluids due to hypoglycemia ?  ?CKD 3B- baseline creatinine ranging between 1.7-2.3 on discharge last week.  Currently at 2.4, appears stable and somewhat close to baseline.  Given reported swelling at home, likely needs Lasix on her regimen, add that today.  Outpatient follow-up with nephrology ? ?Anxiety-continue home Xanax as needed ? ?UDS positive for cocaine-we will discuss with patient, she will need complete cessation ? ? ?Scheduled Meds: ? amLODipine  10 mg Oral Daily  ? aspirin EC  81 mg Oral Daily  ?  atorvastatin  80 mg Oral Daily  ? clopidogrel  75 mg Oral Daily  ? furosemide  40 mg Oral BID  ? heparin  5,000 Units Subcutaneous Q8H  ? hydrALAZINE  50 mg Oral Q8H  ? insulin aspart  0-5 Units Subcutaneous QHS  ? insulin aspart  0-9 Units Subcutaneous TID WC  ? insulin glargine-yfgn  10 Units Subcutaneous Daily  ? isosorbide mononitrate  30 mg Oral Daily  ? nebivolol  20 mg Oral Daily   ? ?Continuous Infusions: ?PRN Meds:.acetaminophen **OR** acetaminophen, ALPRAZolam, hydrALAZINE, nitroGLYCERIN, ondansetron **OR** ondansetron (ZOFRAN) IV, oxyCODONE ? ?Diet Orders (From admission, onward)  ? ?  Start     Ordered  ? 12/31/21 0205  Diet regular Room service appropriate? Yes; Fluid consistency: Thin  Diet effective now       ?Question Answer Comment  ?Room service appropriate? Yes   ?Fluid consistency: Thin   ?  ? 12/31/21 0204  ? ?  ?  ? ?  ? ? ?DVT prophylaxis: heparin injection 5,000 Units Start: 12/31/21 0600 ?SCDs Start: 12/31/21 0205 ? ? ?Lab Results  ?Component Value Date  ? PLT 221 12/30/2021  ? ? ?  Code Status: Full Code ? ?Family Communication: s/o at bedside  ? ?Status is: Inpatient ? ?Remains inpatient appropriate because: Persistent shortness of breath ? ?Level of care: Progressive ? ?Consultants:  ?None ? ?Procedures:  ?none ? ?Microbiology  ?none ? ?Antimicrobials: ?none  ? ? ?Objective: ?Vitals:  ? 01/01/22 2034 01/02/22 0500 01/02/22 0535 01/02/22 0924  ?BP: (!) 112/52  (!) 151/79 (!) 153/69  ?Pulse: 61  60   ?Resp: 16  16   ?Temp: 98 ?F (36.7 ?C)     ?TempSrc: Oral     ?SpO2: 99%  100%   ?Weight:  98.3 kg    ?Height:      ? ? ?Intake/Output Summary (Last 24 hours) at 01/02/2022 1050 ?Last data filed at 01/01/2022 2055 ?Gross per 24 hour  ?Intake 60 ml  ?Output --  ?Net 60 ml  ? ? ?Wt Readings from Last 3 Encounters:  ?01/02/22 98.3 kg  ?12/25/21 77.1 kg  ?12/21/21 90.3 kg  ? ? ?Examination: ? ?Constitutional: NAD ?Eyes: lids and conjunctivae normal, no scleral icterus ?ENMT: mmm ?Neck: normal, supple ?Respiratory: clear to auscultation bilaterally, no wheezing, no crackles. Normal respiratory effort.  ?Cardiovascular: Regular rate and rhythm, no murmurs / rubs / gallops. 1+ LE edema. ?Abdomen: soft, no distention, no tenderness. Bowel sounds positive.  ?Skin: no rashes ?Neurologic: no focal deficits, equal strength ? ?Data Reviewed: I have independently reviewed following labs and  imaging studies  ? ?CBC ?Recent Labs  ?Lab 12/30/21 ?2342  ?WBC 6.2  ?HGB 10.5*  ?HCT 32.9*  ?PLT 221  ?MCV 94.3  ?MCH 30.1  ?MCHC 31.9  ?RDW 13.2  ?LYMPHSABS 1.1  ?MONOABS 0.6  ?EOSABS 0.1  ?BASOSABS 0.0  ? ? ? ?Recent Labs  ?Lab 12/30/21 ?2355 01/01/22 ?0827 01/02/22 ?0542  ?NA 137 131* 133*  ?K 3.6 4.1 4.3  ?CL 111 102 104  ?CO2 20* 19* 22  ?GLUCOSE 62* 268* 178*  ?BUN 14 13 19   ?CREATININE 2.16* 2.03* 2.40*  ?CALCIUM 8.7* 8.3* 8.6*  ?AST 31  --   --   ?ALT 15  --   --   ?ALKPHOS 127*  --   --   ?BILITOT 0.5  --   --   ?ALBUMIN 2.6*  --   --   ?HGBA1C 6.4*  --   --   ? ? ? ?------------------------------------------------------------------------------------------------------------------ ?  No results for input(s): CHOL, HDL, LDLCALC, TRIG, CHOLHDL, LDLDIRECT in the last 72 hours. ? ?Lab Results  ?Component Value Date  ? HGBA1C 6.4 (H) 12/30/2021  ? ?------------------------------------------------------------------------------------------------------------------ ?No results for input(s): TSH, T4TOTAL, T3FREE, THYROIDAB in the last 72 hours. ? ?Invalid input(s): FREET3 ? ?Cardiac Enzymes ?No results for input(s): CKMB, TROPONINI, MYOGLOBIN in the last 168 hours. ? ?Invalid input(s): CK ?------------------------------------------------------------------------------------------------------------------ ?   ?Component Value Date/Time  ? BNP 202.9 (H) 12/20/2021 0526  ? ? ?CBG: ?Recent Labs  ?Lab 01/01/22 ?1242 01/01/22 ?1442 01/01/22 ?1630 01/01/22 ?2033 01/02/22 ?0820  ?GLUCAP 232* 209* 193* 306* 269*  ? ? ? ?No results found for this or any previous visit (from the past 240 hour(s)).  ? ?Radiology Studies: ?No results found. ? ? ?Marzetta Board, MD, PhD ?[facility] ? ?Between 7 am - 7 pm I am available, please contact me via Amion (for emergencies) or Securechat (non urgent messages) ? ?Between 7 pm - 7 am I am not available, please contact night coverage MD/APP via Amion ? ?

## 2022-01-03 ENCOUNTER — Encounter (HOSPITAL_COMMUNITY): Payer: Self-pay | Admitting: Internal Medicine

## 2022-01-03 LAB — GLUCOSE, CAPILLARY
Glucose-Capillary: 155 mg/dL — ABNORMAL HIGH (ref 70–99)
Glucose-Capillary: 211 mg/dL — ABNORMAL HIGH (ref 70–99)

## 2022-01-03 LAB — BASIC METABOLIC PANEL
Anion gap: 9 (ref 5–15)
BUN: 25 mg/dL — ABNORMAL HIGH (ref 6–20)
CO2: 21 mmol/L — ABNORMAL LOW (ref 22–32)
Calcium: 8.5 mg/dL — ABNORMAL LOW (ref 8.9–10.3)
Chloride: 101 mmol/L (ref 98–111)
Creatinine, Ser: 2.38 mg/dL — ABNORMAL HIGH (ref 0.44–1.00)
GFR, Estimated: 23 mL/min — ABNORMAL LOW (ref >60)
Glucose, Bld: 151 mg/dL — ABNORMAL HIGH (ref 70–99)
Potassium: 4.2 mmol/L (ref 3.5–5.1)
Sodium: 131 mmol/L — ABNORMAL LOW (ref 135–145)

## 2022-01-03 LAB — CBC
HCT: 28.9 % — ABNORMAL LOW (ref 36.0–46.0)
Hemoglobin: 9.6 g/dL — ABNORMAL LOW (ref 12.0–15.0)
MCH: 30.5 pg (ref 26.0–34.0)
MCHC: 33.2 g/dL (ref 30.0–36.0)
MCV: 91.7 fL (ref 80.0–100.0)
Platelets: 230 10*3/uL (ref 150–400)
RBC: 3.15 MIL/uL — ABNORMAL LOW (ref 3.87–5.11)
RDW: 13.2 % (ref 11.5–15.5)
WBC: 7.2 10*3/uL (ref 4.0–10.5)
nRBC: 0 % (ref 0.0–0.2)

## 2022-01-03 MED ORDER — FUROSEMIDE 40 MG PO TABS: 40.0000 mg | ORAL_TABLET | Freq: Two times a day (BID) | ORAL | 1 refills | Status: DC

## 2022-01-03 MED ORDER — FREESTYLE TEST VI STRP: 1.0000 | ORAL_STRIP | 5 refills | Status: DC | PRN

## 2022-01-03 MED ORDER — HYDRALAZINE HCL 50 MG PO TABS: 50.0000 mg | ORAL_TABLET | Freq: Three times a day (TID) | ORAL | 1 refills | Status: DC

## 2022-01-03 NOTE — Plan of Care (Signed)

## 2022-01-03 NOTE — Discharge Summary (Signed)
Physician Discharge Summary  ?Carol [name] MEQ:[id number] DOB: 1961-07-27 DOA: 12/30/2021 ? ?PCP: Simona Huh, NP ? ?Admit date: 12/30/2021 ?Discharge date: 01/03/2022 ? ?Admitted From: Home ?Disposition:  Home ? ?Discharge Condition:Stable ?CODE STATUS:FULL ?Diet recommendation:  Carb Modified  ? ?Brief/Interim Summary: ? ?61 year old female with DM2, CAD, HTN, CKD 3B comes in today with hyperglycemia.  Her glucometer is not working for the past week and she is waiting to get a new one in the mail.  EMS was called the day prior to admission because she was feeling shaky, she was found to be hypoglycemic.  EMS treated her with IV dextrose.  Despite that episode, she continue taking her Lantus and EMS had to be called again, she was found to be hypoglycemic and brought to the hospital.  Diabetic coordinator was following.  Currently hypoglycemia has resolved.  Hospital course also remarkable for acute hypoxic respiratory failure due to fluid overload in the setting of chronic kidney disease, now started on Lasix.  Respiratory status is stable.  Currently she is hemodynamically stable.  I discussed with diabetic coordinator about need for adjustment of her insulin on discharge.  Diabetic coordinator recommended to continue the same home regimen for now and she will follow-up with her PCP in the next few days.  Medically stable for discharge. ? ?Following problems were addressed during her hospitalization: ? ?Hypoglycemia with underlying insulin-dependent type 2 diabetes mellitus-A1c 6.4.  Patient does not have a glucometer at home.  She appears to have poor insight into her medical condition.  Despite knowing that she was hypoglycemic earlier, as described above, she took her insulin and did not check her CBG.  Hypoglycemia now resolved, she is off the dextrose infusion.  We discussed with diabetic coordinator about need for adjustment of her insulin on discharge.  Diabetic coordinator recommended to continue  the same home regimen for now and she will follow-up with her PCP in the next few days.  She has been recommended to monitor her blood sugars at home. ? ?Acute hypoxic respiratory failure due to fluid overload in the setting of chronic kidney disease-fluid overload iatrogenic, likely in the setting of her requiring dextrose infusion for hypoglycemia.  Chest x-ray 5/15 with mild fluid overload, image personally reviewed.  Most recent 2D echo done 2 weeks ago showed normal EF 60-65%.  She was placed on IV Lasix, good results, attempt to wean off to room air today.  Lower extremity swelling still persistent but improved.  She states that she has not established with nephrology yet, has been dealing with lower extremity swelling for a while.  Given slight bump in creatinine discontinue IV Lasix and placed on p.o. continue Lasix on discharge ?   ?Hypertensive urgency-on admission, blood pressure was in the 297L systolic.  Her hydralazine was increased, blood pressure better.  Added Lasix p.o. to her home regimen ?  ?CAD-continue medications as below, no chest pain.  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  She is on aspirin, Plavix, hydralazine, Imdur.  Continue, chest pain-free ?  ?Chronic diastolic CHF-most recent echo last week showed an EF of 65 to 65%.  Lasix as above for iatrogenic edema due to IV fluids due to hypoglycemia ?  ?CKD 3B- baseline creatinine ranging between 1.7-2.3 on discharge last week.  Currently at 2.4, appears stable and somewhat close  to baseline. Outpatient follow-up with nephrology recommended ?  ?Anxiety-continue home Xanax as needed ?  ?UDS positive for cocaine-we will discuss with patient, she will need complete cessation ? ? ?Discharge Diagnoses:  ?Principal Problem: ?  Diabetic hypoglycemia ([place]) ?Active Problems: ?   Essential hypertension ?  CAD in native artery ?  Stage 3b chronic kidney disease (CKD) ([place]) ?  Cocaine abuse ([place]) ? ? ? ?Discharge Instructions ? ?Discharge Instructions   ? ? Ambulatory referral to Nephrology   Complete by: As directed ?  ? Diet Carb Modified   Complete by: As directed ?  ? Discharge instructions   Complete by: As directed ?  ? 1)Please monitor your blood sugars at home.Be careful to avoid low blood sugar ?2)Follow up with your PCP in a week.  Do a BMP test to check your kidney function during the follow-up ?3)Follow up with nephrology as an outpatient in 2 to 3 weeks.  Name and number the provider has been attached.  Please call for appointment  ? Increase activity slowly   Complete by: As directed ?  ? ?  ? ?Allergies as of 01/03/2022   ? ?   Reactions  ? Penicillins Shortness Of Breath, Other (See Comments)  ? Caused yeast infection ?Has patient had a PCN reaction causing immediate rash, facial/tongue/throat swelling, SOB or lightheadedness with hypotension: Yes ?Has patient had a PCN reaction causing severe rash involving mucus membranes or skin necrosis: No ?Has patient had a PCN reaction that required hospitalization pt was in the hospital at the time of the reaction ?Has patient had a PCN reaction occurring within the last 10 years: No ?If all of the above answers are "NO", then may proceed with Cephalosp  ? Tramadol Other (See Comments)  ? unknown  ? ?  ? ?  ?Medication List  ?  ? ?TAKE these medications   ? ?albuterol 108 (90 Base) MCG/ACT inhaler ?Commonly known as: VENTOLIN HFA ?Inhale 2 puffs into the lungs every 6 (six) hours as needed for wheezing or shortness of breath. ?  ?ALPRAZolam 1 MG tablet ?Commonly known as: Duanne Moron ?Take 1 mg by mouth 2 (two) times daily as needed for anxiety. ?  ?amLODipine 10 MG tablet ?Commonly known as: NORVASC ?Take 1 tablet (10 mg total) by mouth daily. ?  ?aspirin 81 MG EC tablet ?Take 1 tablet (81 mg total) by mouth daily. Swallow whole. ?   ?atorvastatin 80 MG tablet ?Commonly known as: LIPITOR ?Take 1 tablet (80 mg total) by mouth daily. ?  ?budesonide-formoterol 160-4.5 MCG/ACT inhaler ?Commonly known as: SYMBICORT ?Inhale 2 puffs into the lungs in the morning and at bedtime. ?  ?Buprenorphine HCl-Naloxone HCl 4-1 MG Film ?Place 0.5 strips under the tongue daily as needed (narcotic dependance). ?  ?cetirizine 10 MG tablet ?Commonly known as: ZYRTEC ?Take 10 mg by mouth daily as needed for allergies. ?  ?clopidogrel 75 MG tablet ?Commonly known as: PLAVIX ?Take 1 tablet (75 mg total) by mouth daily. ?  ?cyclobenzaprine 10 MG tablet ?Commonly known as: FLEXERIL ?Take 1 tablet (10 mg total) by mouth 3 (three) times daily as needed for muscle spasms. ?  ?diphenhydramine-acetaminophen 25-500 MG Tabs tablet ?Commonly known as: TYLENOL PM ?Take 2 tablets by mouth at bedtime as needed (For sleeping disorder.). ?  ?ergocalciferol 1.25 MG (50000 UT) capsule ?Commonly known as: VITAMIN D2 ?Take 50,000 Units by mouth every Wednesday. ?  ?FREESTYLE TEST STRIPS test strip ?Generic drug: glucose blood ?1 each by Other  route as needed for other. ?  ?furosemide 40 MG tablet ?Commonly known as: LASIX ?Take 1 tablet (40 mg total) by mouth 2 (two) times daily. ?  ?gabapentin 100 MG capsule ?Commonly known as: NEURONTIN ?Take 100 mg by mouth 3 (three) times daily. ?  ?hydrALAZINE 50 MG tablet ?Commonly known as: APRESOLINE ?Take 1 tablet (50 mg total) by mouth every 8 (eight) hours. ?What changed:  ?medication strength ?how much to take ?  ?hydrOXYzine 50 MG tablet ?Commonly known as: ATARAX ?Take 50 mg by mouth 3 (three) times daily as needed for anxiety or itching. ?  ?insulin aspart 100 UNIT/ML FlexPen ?Commonly known as: NOVOLOG ?Inject 12 Units into the skin 3 (three) times daily with meals. ?  ?insulin glargine 100 UNIT/ML injection ?Commonly known as: LANTUS ?Inject 60 Units into the skin every evening. ?  ?isosorbide mononitrate 30 MG 24 hr tablet ?Commonly  known as: IMDUR ?Take 1 tablet (30 mg total) by mouth daily. ?  ?Nebivolol HCl 20 MG Tabs ?Take 20 mg by mouth daily. ?  ?nitroGLYCERIN 0.4 MG SL tablet ?Commonly known as: NITROSTAT ?Place 1 tablet (0.4 mg total) und

## 2022-01-03 NOTE — Care Management Important Message (Signed)
Important Message ? ?Patient Details  ?Name: Carol [name] ?MRN: [id number] ?Date of Birth: 11/25/1960 ? ? ?Medicare Important Message Given:  Yes ? ? ? ? ?[name] ?01/03/2022, 1:13 PM ?

## 2022-01-03 NOTE — Progress Notes (Signed)
Mobility Specialist Progress Note  ? ? 01/03/22 1121  ?Mobility  ?Activity Ambulated independently in hallway  ?Level of Assistance Standby assist, set-up cues, supervision of patient - no hands on  ?Assistive Device None  ?Distance Ambulated (ft) 400 ft  ?Activity Response Tolerated well  ?$Mobility charge 1 Mobility  ? ?During Mobility: 71 HR, 99% SpO2 ? ?Pt received in bed and agreeable. No complaints on walk. Returned to sitting EOB with call bell in reach.   ? ?Carol [name] ?Mobility Specialist  ?Primary: 5[address] M.S. Phone: [phone] ?Secondary: 6[address] M.S. Phone: [phone] ?  ?

## 2022-01-10 ENCOUNTER — Emergency Department (HOSPITAL_COMMUNITY)
Admission: EM | Admit: 2022-01-10 | Discharge: 2022-01-10 | Disposition: A | Discharge status: Home / Self Care | Payer: Medicare Other | Attending: Emergency Medicine | Admitting: Emergency Medicine

## 2022-01-10 ENCOUNTER — Emergency Department (HOSPITAL_COMMUNITY): Payer: Medicare Other

## 2022-01-10 ENCOUNTER — Encounter (HOSPITAL_COMMUNITY): Payer: Self-pay

## 2022-01-10 DIAGNOSIS — R7989 Other specified abnormal findings of blood chemistry: Secondary | ICD-10-CM | POA: Insufficient documentation

## 2022-01-10 DIAGNOSIS — R0602 Shortness of breath: Secondary | ICD-10-CM | POA: Insufficient documentation

## 2022-01-10 DIAGNOSIS — Z7982 Long term (current) use of aspirin: Secondary | ICD-10-CM | POA: Diagnosis not present

## 2022-01-10 DIAGNOSIS — Z794 Long term (current) use of insulin: Secondary | ICD-10-CM | POA: Diagnosis not present

## 2022-01-10 DIAGNOSIS — I129 Hypertensive chronic kidney disease with stage 1 through stage 4 chronic kidney disease, or unspecified chronic kidney disease: Secondary | ICD-10-CM | POA: Insufficient documentation

## 2022-01-10 DIAGNOSIS — Z20822 Contact with and (suspected) exposure to covid-19: Secondary | ICD-10-CM | POA: Diagnosis not present

## 2022-01-10 DIAGNOSIS — R079 Chest pain, unspecified: Secondary | ICD-10-CM

## 2022-01-10 DIAGNOSIS — R072 Precordial pain: Secondary | ICD-10-CM | POA: Diagnosis not present

## 2022-01-10 DIAGNOSIS — I2510 Atherosclerotic heart disease of native coronary artery without angina pectoris: Secondary | ICD-10-CM | POA: Insufficient documentation

## 2022-01-10 DIAGNOSIS — N189 Chronic kidney disease, unspecified: Secondary | ICD-10-CM | POA: Insufficient documentation

## 2022-01-10 DIAGNOSIS — Z79899 Other long term (current) drug therapy: Secondary | ICD-10-CM | POA: Insufficient documentation

## 2022-01-10 DIAGNOSIS — R519 Headache, unspecified: Secondary | ICD-10-CM | POA: Insufficient documentation

## 2022-01-10 DIAGNOSIS — Z7902 Long term (current) use of antithrombotics/antiplatelets: Secondary | ICD-10-CM | POA: Diagnosis not present

## 2022-01-10 DIAGNOSIS — E1122 Type 2 diabetes mellitus with diabetic chronic kidney disease: Secondary | ICD-10-CM | POA: Diagnosis not present

## 2022-01-10 LAB — PROTIME-INR
INR: 1 (ref 0.8–1.2)
Prothrombin Time: 13.3 seconds (ref 11.4–15.2)

## 2022-01-10 LAB — BRAIN NATRIURETIC PEPTIDE: B Natriuretic Peptide: 464.3 pg/mL — ABNORMAL HIGH (ref 0.0–100.0)

## 2022-01-10 LAB — BASIC METABOLIC PANEL
Anion gap: 11 (ref 5–15)
BUN: 8 mg/dL (ref 6–20)
CO2: 19 mmol/L — ABNORMAL LOW (ref 22–32)
Calcium: 8.7 mg/dL — ABNORMAL LOW (ref 8.9–10.3)
Chloride: 108 mmol/L (ref 98–111)
Creatinine, Ser: 1.47 mg/dL — ABNORMAL HIGH (ref 0.44–1.00)
GFR, Estimated: 41 mL/min — ABNORMAL LOW (ref >60)
Glucose, Bld: 153 mg/dL — ABNORMAL HIGH (ref 70–99)
Potassium: 3.7 mmol/L (ref 3.5–5.1)
Sodium: 138 mmol/L (ref 135–145)

## 2022-01-10 LAB — LIPASE, BLOOD: Lipase: 70 U/L — ABNORMAL HIGH (ref 11–51)

## 2022-01-10 LAB — CBC
HCT: 31.2 % — ABNORMAL LOW (ref 36.0–46.0)
Hemoglobin: 10.3 g/dL — ABNORMAL LOW (ref 12.0–15.0)
MCH: 29.9 pg (ref 26.0–34.0)
MCHC: 33 g/dL (ref 30.0–36.0)
MCV: 90.7 fL (ref 80.0–100.0)
Platelets: 210 10*3/uL (ref 150–400)
RBC: 3.44 MIL/uL — ABNORMAL LOW (ref 3.87–5.11)
RDW: 13 % (ref 11.5–15.5)
WBC: 7.4 10*3/uL (ref 4.0–10.5)
nRBC: 0 % (ref 0.0–0.2)

## 2022-01-10 LAB — CBG MONITORING, ED: Glucose-Capillary: 143 mg/dL — ABNORMAL HIGH (ref 70–99)

## 2022-01-10 LAB — RESP PANEL BY RT-PCR (FLU A&B, COVID) ARPGX2
Influenza A by PCR: NEGATIVE
Influenza B by PCR: NEGATIVE
SARS Coronavirus 2 by RT PCR: NEGATIVE

## 2022-01-10 LAB — TROPONIN I (HIGH SENSITIVITY)
Troponin I (High Sensitivity): 39 ng/L — ABNORMAL HIGH (ref <18)
Troponin I (High Sensitivity): 31 ng/L — ABNORMAL HIGH (ref <18)

## 2022-01-10 LAB — MAGNESIUM: Magnesium: 1.7 mg/dL (ref 1.7–2.4)

## 2022-01-10 MED ORDER — AMLODIPINE BESYLATE 5 MG PO TABS
10.0000 mg | ORAL_TABLET | Freq: Every day | ORAL | Status: DC
  Administered 2022-01-10: 10 mg via ORAL
  Filled 2022-01-10: qty 2

## 2022-01-10 MED ORDER — MAGNESIUM SULFATE IN D5W 1-5 GM/100ML-% IV SOLN
1.0000 g | Freq: Once | INTRAVENOUS | Status: AC
  Administered 2022-01-10: 1 g via INTRAVENOUS
  Filled 2022-01-10: qty 100

## 2022-01-10 MED ORDER — FENTANYL CITRATE PF 50 MCG/ML IJ SOSY
100.0000 ug | PREFILLED_SYRINGE | Freq: Once | INTRAMUSCULAR | Status: AC
  Administered 2022-01-10: 100 ug via INTRAVENOUS
  Filled 2022-01-10: qty 2

## 2022-01-10 MED ORDER — ISOSORBIDE MONONITRATE ER 30 MG PO TB24
30.0000 mg | ORAL_TABLET | Freq: Every day | ORAL | Status: DC
  Administered 2022-01-10: 30 mg via ORAL
  Filled 2022-01-10: qty 1

## 2022-01-10 MED ORDER — NEBIVOLOL HCL 10 MG PO TABS
20.0000 mg | ORAL_TABLET | Freq: Every day | ORAL | Status: DC
  Administered 2022-01-10: 20 mg via ORAL
  Filled 2022-01-10: qty 2

## 2022-01-10 MED ORDER — POTASSIUM CHLORIDE CRYS ER 20 MEQ PO TBCR
40.0000 meq | EXTENDED_RELEASE_TABLET | Freq: Once | ORAL | Status: AC
  Administered 2022-01-10: 40 meq via ORAL
  Filled 2022-01-10: qty 2

## 2022-01-10 MED ORDER — FUROSEMIDE 10 MG/ML IJ SOLN
40.0000 mg | Freq: Once | INTRAMUSCULAR | Status: AC
  Administered 2022-01-10: 40 mg via INTRAVENOUS
  Filled 2022-01-10: qty 4

## 2022-01-10 MED ORDER — NITROGLYCERIN 2 % TD OINT
1.0000 [in_us] | TOPICAL_OINTMENT | Freq: Once | TRANSDERMAL | Status: AC
  Administered 2022-01-10: 1 [in_us] via TOPICAL
  Filled 2022-01-10: qty 1

## 2022-01-10 NOTE — Discharge Instructions (Signed)
Follow-up with your cardiologist.  Continue to take all medications as prescribed.  Return to the emergency department at any time if you experience any new or worsening symptoms.

## 2022-01-10 NOTE — ED Notes (Signed)
Pt reports 0/10 headache at this time. Continues to report 8/10 mid chest pain. Will notify MD. Bp stays at 208/95. Alert and oriented x 4. Cardiac monitoring in place.

## 2022-01-10 NOTE — ED Triage Notes (Signed)
Discharged from the hospital 3 days ago. Reports shortness of breath that started this AM with chest pain. Took 1 nitro SL at home with mild relief. EMS gave 2nd dose of nitro SL and 324mg  Aspirin. Alert and oriented x 4.

## 2022-01-10 NOTE — ED Provider Notes (Signed)
[facility] EMERGENCY DEPARTMENT Provider Note   CSN: [id number] Arrival date & time: 01/10/22  3710     History  Chief Complaint  Patient presents with   Shortness of Breath    Carol [name] is a 61 y.o. female.   Shortness of Breath Associated symptoms: chest pain and headaches   Patient presents with shortness of breath.  Onset was this morning at approximately 6 AM.  Symptoms did wake her up from sleep.  EMS was called.  When they arrived, she endorsed substernal chest pain.  She took 1 SL NTG prior to their arrival with mild improvement in her chest pain.  She was given 324 of ASA and an additional dose of NTG.  Currently, she continues to endorse chest pain.  She has also had headache and photosensitivity.  EMS noted vital signs notable for hypertension and tachypnea.  Her SPO2 was normal on room air.  Patient's medical history includes DM, HTN, CAD, anxiety, CKD, depression, GERD.  She had a recent hospitalization for hypoglycemia.  She was discharged 1 week ago.  During that hospitalization, she did have hypoxic respiratory failure due to fluid overload.  She was started on Lasix.  She underwent a heart cath 3 weeks ago.  She was advised medical therapy with continuation of ASA, beta-blocker, and statin and initiation of Plavix.    Home Medications Prior to Admission medications   Medication Sig Start Date End Date Taking? Authorizing Provider  albuterol (PROVENTIL HFA;VENTOLIN HFA) 108 (90 Base) MCG/ACT inhaler Inhale 2 puffs into the lungs every 6 (six) hours as needed for wheezing or shortness of breath.    [provider]  ALPRAZolam Duanne Moron) 1 MG tablet Take 1 mg by mouth 2 (two) times daily as needed for anxiety.  09/02/15   [provider]  amLODipine (NORVASC) 10 MG tablet Take 1 tablet (10 mg total) by mouth daily. 11/26/21   Varney Biles, MD  aspirin EC 81 MG EC tablet Take 1 tablet (81 mg total) by mouth daily. Swallow  whole. 12/25/21   Dunn, Nedra Hai, PA-C  atorvastatin (LIPITOR) 80 MG tablet Take 1 tablet (80 mg total) by mouth daily. 12/25/21   Dunn, Nedra Hai, PA-C  budesonide-formoterol (SYMBICORT) 160-4.5 MCG/ACT inhaler Inhale 2 puffs into the lungs in the morning and at bedtime. 11/09/21   [provider]  Buprenorphine HCl-Naloxone HCl 4-1 MG FILM Place 0.5 strips under the tongue daily as needed (narcotic dependance). 08/01/21   [provider]  cetirizine (ZYRTEC) 10 MG tablet Take 10 mg by mouth daily as needed for allergies.    [provider]  clopidogrel (PLAVIX) 75 MG tablet Take 1 tablet (75 mg total) by mouth daily. 12/25/21   Dunn, Nedra Hai, PA-C  cyclobenzaprine (FLEXERIL) 10 MG tablet Take 1 tablet (10 mg total) by mouth 3 (three) times daily as needed for muscle spasms. 05/18/19   Domenic Moras, PA-C  diphenhydramine-acetaminophen (TYLENOL PM) 25-500 MG TABS Take 2 tablets by mouth at bedtime as needed (For sleeping disorder.).     [provider]  ergocalciferol (VITAMIN D2) 1.25 MG (50000 UT) capsule Take 50,000 Units by mouth every Wednesday.    [provider]  furosemide (LASIX) 40 MG tablet Take 1 tablet (40 mg total) by mouth 2 (two) times daily. 01/03/22   Shelly Coss, MD  gabapentin (NEURONTIN) 100 MG capsule Take 100 mg by mouth 3 (three) times daily.    [provider]  glucose blood (FREESTYLE  TEST STRIPS) test strip 1 each by Other route as needed for other. 01/03/22   Shelly Coss, MD  hydrALAZINE (APRESOLINE) 50 MG tablet Take 1 tablet (50 mg total) by mouth every 8 (eight) hours. 01/03/22   Shelly Coss, MD  hydrOXYzine (ATARAX) 50 MG tablet Take 50 mg by mouth 3 (three) times daily as needed for anxiety or itching.    [provider]  insulin aspart (NOVOLOG) 100 UNIT/ML FlexPen Inject 12 Units into the skin 3 (three) times daily with meals.    [provider]  insulin glargine (LANTUS) 100 UNIT/ML injection  Inject 60 Units into the skin every evening.    [provider]  isosorbide mononitrate (IMDUR) 30 MG 24 hr tablet Take 1 tablet (30 mg total) by mouth daily. 12/25/21   Dunn, Nedra Hai, PA-C  Nebivolol HCl 20 MG TABS Take 20 mg by mouth daily. 12/07/21   [provider]  nitroGLYCERIN (NITROSTAT) 0.4 MG SL tablet Place 1 tablet (0.4 mg total) under the tongue every 5 (five) minutes as needed for chest pain (up to 3 doses). 12/24/21   Dunn, Nedra Hai, PA-C  oxyCODONE-acetaminophen (PERCOCET/ROXICET) 5-325 MG tablet Take 1-2 tablets by mouth every 6 (six) hours as needed for severe pain. 08/31/15   Cartner, Marland Kitchen, PA-C  pantoprazole (PROTONIX) 40 MG tablet Take 1 tablet (40 mg total) by mouth daily at 12 noon. Patient taking differently: Take 40 mg by mouth daily as needed (acid reflux). 02/02/14   Regalado, Belkys A, MD  QUEtiapine (SEROQUEL XR) 300 MG 24 hr tablet Take 300 mg by mouth at bedtime. 12/07/21   [provider]  sertraline (ZOLOFT) 50 MG tablet Take 50 mg by mouth daily.    [provider]  triamcinolone ointment (KENALOG) 0.1 % Apply 1 application topically 2 (two) times daily as needed (Apply to feet after showering for dry skin.).     [provider]      Allergies    Penicillins and Tramadol    Review of Systems   Review of Systems  Respiratory:  Positive for shortness of breath.   Cardiovascular:  Positive for chest pain.  Neurological:  Positive for headaches.  All other systems reviewed and are negative.  Physical Exam Updated Vital Signs BP 138/75   Pulse 62   Temp 98.3 F (36.8 C) (Oral)   Resp (!) 24   SpO2 99%  Physical Exam Vitals and nursing note reviewed.  Constitutional:      General: She is not in acute distress.    Appearance: She is well-developed. She is not ill-appearing, toxic-appearing or diaphoretic.  HENT:     Head: Normocephalic and atraumatic.  Eyes:     Extraocular Movements: Extraocular movements  intact.     Conjunctiva/sclera: Conjunctivae normal.  Neck:     Vascular: No JVD.  Cardiovascular:     Rate and Rhythm: Normal rate and regular rhythm.     Heart sounds: No murmur heard. Pulmonary:     Effort: Pulmonary effort is normal. Tachypnea present. No respiratory distress.     Breath sounds: Normal breath sounds. No decreased breath sounds, wheezing, rhonchi or rales.  Chest:     Chest wall: No tenderness.  Abdominal:     Palpations: Abdomen is soft.     Tenderness: There is no abdominal tenderness.  Musculoskeletal:        General: No swelling. Normal range of motion.     Cervical back: Normal range of motion and neck  supple.     Right lower leg: No edema.     Left lower leg: No edema.  Skin:    General: Skin is warm and dry.     Capillary Refill: Capillary refill takes less than 2 seconds.     Coloration: Skin is not cyanotic or pale.  Neurological:     General: No focal deficit present.     Mental Status: She is alert and oriented to person, place, and time.     Cranial Nerves: No cranial nerve deficit.     Motor: No weakness.  Psychiatric:        Mood and Affect: Mood normal.        Behavior: Behavior normal.    ED Results / Procedures / Treatments   Labs (all labs ordered are listed, but only abnormal results are displayed) Labs Reviewed  BASIC METABOLIC PANEL - Abnormal; Notable for the following components:      Result Value   CO2 19 (*)    Glucose, Bld 153 (*)    Creatinine, Ser 1.47 (*)    Calcium 8.7 (*)    GFR, Estimated 41 (*)    All other components within normal limits  CBC - Abnormal; Notable for the following components:   RBC 3.44 (*)    Hemoglobin 10.3 (*)    HCT 31.2 (*)    All other components within normal limits  LIPASE, BLOOD - Abnormal; Notable for the following components:   Lipase 70 (*)    All other components within normal limits  BRAIN NATRIURETIC PEPTIDE - Abnormal; Notable for the following components:   B Natriuretic  Peptide 464.3 (*)    All other components within normal limits  CBG MONITORING, ED - Abnormal; Notable for the following components:   Glucose-Capillary 143 (*)    All other components within normal limits  TROPONIN I (HIGH SENSITIVITY) - Abnormal; Notable for the following components:   Troponin I (High Sensitivity) 39 (*)    All other components within normal limits  TROPONIN I (HIGH SENSITIVITY) - Abnormal; Notable for the following components:   Troponin I (High Sensitivity) 31 (*)    All other components within normal limits  RESP PANEL BY RT-PCR (FLU A&B, COVID) ARPGX2  MAGNESIUM  PROTIME-INR    EKG EKG Interpretation  Date/Time:  Wednesday Jan 10 2022 09:40:00 EDT Ventricular Rate:  94 PR Interval:  150 QRS Duration: 107 QT Interval:  389 QTC Calculation: 487 R Axis:   32 Text Interpretation: Sinus rhythm Left atrial enlargement Borderline prolonged QT interval Confirmed by Godfrey Pick [phone]) on 01/10/2022 10:14:44 AM  Radiology DG Chest Portable 1 View  Result Date: 01/10/2022 CLINICAL DATA:  Shortness of breath EXAM: PORTABLE CHEST 1 VIEW COMPARISON:  Chest x-ray dated Jan 01, 2022 FINDINGS: Cardiac and mediastinal contours are unchanged. Mild bibasilar opacities. No large pleural effusion or pneumothorax. IMPRESSION: Mild bibasilar opacities, likely due to atelectasis. Electronically Signed   By: Yetta Glassman M.D.   On: 01/10/2022 10:02    Procedures Procedures    Medications Ordered in ED Medications  amLODipine (NORVASC) tablet 10 mg (10 mg Oral Given 01/10/22 1159)  isosorbide mononitrate (IMDUR) 24 hr tablet 30 mg (30 mg Oral Given 01/10/22 1159)  nebivolol (BYSTOLIC) tablet 20 mg (20 mg Oral Given 01/10/22 1219)  nitroGLYCERIN (NITROGLYN) 2 % ointment 1 inch (1 inch Topical Given 01/10/22 1023)  fentaNYL (SUBLIMAZE) injection 100 mcg (100 mcg Intravenous Given 01/10/22 1024)  magnesium sulfate IVPB 1 g 100  mL (1 g Intravenous New Bag/Given 01/10/22 1219)   potassium chloride SA (KLOR-CON M) CR tablet 40 mEq (40 mEq Oral Given 01/10/22 1159)  furosemide (LASIX) injection 40 mg (40 mg Intravenous Given 01/10/22 1159)    ED Course/ Medical Decision Making/ A&P                           Medical Decision Making Amount and/or Complexity of Data Reviewed Labs: ordered. Radiology: ordered.  Risk Prescription drug management.   This patient presents to the ED for concern of chest pain and shortness of breath, this involves an extensive number of treatment options, and is a complaint that carries with it a high risk of complications and morbidity.  The differential diagnosis includes ACS, CHF, reactive airway disease, pneumonia, pericarditis, PE   Co morbidities that complicate the patient evaluation  DM, HTN, CAD, anxiety, CKD, depression, GERD   Additional history obtained:  Additional history obtained from EMS External records from outside source obtained and reviewed including EMR   Lab Tests:  I Ordered, and personally interpreted labs.  The pertinent results include: CKD from baseline, decreased bicarb (consistent with prior lab work), baseline anemia, no leukocytosis, normal electrolytes, BNP is increased from baseline, troponins are mildly increased but stable and significantly improved from her recent hospitalization   Imaging Studies ordered:  I ordered imaging studies including chest x-ray I independently visualized and interpreted imaging which showed no acute findings I agree with the radiologist interpretation   Cardiac Monitoring: / EKG:  The patient was maintained on a cardiac monitor.  I personally viewed and interpreted the cardiac monitored which showed an underlying rhythm of: Sinus rhythm  Problem List / ED Course / Critical interventions / Medication management  Patient is a 61 year old female presenting for chest pain and shortness of breath.  Onset was this morning at approximately 6 AM.  On arrival, she is  hypertensive and tachypneic.  Lungs are clear to auscultation.  Currently, she endorses generalized headache and a/10 severity chest pain.  Prior to arrival, she did receive ASA and NTG.  EKG shows no ST segment changes.  Patient was given nitroglycerin ointment for treatment of her chest pain and hypertension.  Fentanyl was given for analgesia.  Patient reports that she has not taken her daily medications.  Typically, she will take her blood pressure medications in the morning.  Home blood pressure medications were ordered.  Lab work shows baseline anemia, no leukocytosis, creatinine improved from baseline, baseline decreased bicarb, BNP slightly elevated from baseline, and stable troponins that are significantly improved from her recent hospitalization.  Of note, during that hospitalization, she did undergo a left heart cath.  That cath did reveal a small subbranch off of right PDA occlusion which was deemed to be the cause of her NSTEMI at that time.  Vessels were otherwise widely patent.  Given her elevated BNP, patient was given 40 mg of IV Lasix.  She is prescribed 40 mg p.o. twice daily and has not taken any today.  With the Lasix, electrolyte replacements were given for potassium and magnesium.  With patient's improved blood pressure, she had complete resolution of symptoms.  This was sustained while in the ED.  Given resolution of headache, I do not feel that CT scan is warranted.  Given resolution of chest pain and recent heart cath, I do not feel that admission for continued observation is needed.  Patient also endorses preference for discharge home.  She was advised to follow-up with cardiology and to return to the ED at any time if symptoms return.  She was discharged in good condition. I ordered medication including NTG and fentanyl for chest pain; home blood pressure medications for hypertension; magnesium sulfate and potassium chloride for electrolyte optimization; Lasix for diuresis Reevaluation  of the patient after these medicines showed that the patient resolved I have reviewed the patients home medicines and have made adjustments as needed   Social Determinants of Health:  Has access to outpatient care, including cardiology         Final Clinical Impression(s) / ED Diagnoses Final diagnoses:  SOB (shortness of breath)  Chest pain, unspecified type    Rx / DC Orders ED Discharge Orders          Ordered    Ambulatory referral to Cardiology       Comments: If you have not heard from the Cardiology office within the next 72 hours please call [phone].   01/10/22 1540              Godfrey Pick, MD 01/10/22 1551

## 2022-01-22 ENCOUNTER — Emergency Department (HOSPITAL_COMMUNITY): Payer: Medicare Other

## 2022-01-22 ENCOUNTER — Inpatient Hospital Stay (HOSPITAL_COMMUNITY)
Admission: EM | Admit: 2022-01-22 | Discharge: 2022-02-04 | DRG: 304 | Disposition: A | Discharge status: Home Health | Payer: Medicare Other | Attending: Internal Medicine | Admitting: Internal Medicine

## 2022-01-22 ENCOUNTER — Inpatient Hospital Stay (HOSPITAL_COMMUNITY): Payer: Medicare Other

## 2022-01-22 DIAGNOSIS — Z794 Long term (current) use of insulin: Secondary | ICD-10-CM

## 2022-01-22 DIAGNOSIS — E1122 Type 2 diabetes mellitus with diabetic chronic kidney disease: Secondary | ICD-10-CM | POA: Diagnosis present

## 2022-01-22 DIAGNOSIS — I2510 Atherosclerotic heart disease of native coronary artery without angina pectoris: Secondary | ICD-10-CM | POA: Diagnosis present

## 2022-01-22 DIAGNOSIS — R34 Anuria and oliguria: Secondary | ICD-10-CM | POA: Diagnosis not present

## 2022-01-22 DIAGNOSIS — I959 Hypotension, unspecified: Secondary | ICD-10-CM | POA: Diagnosis not present

## 2022-01-22 DIAGNOSIS — I160 Hypertensive urgency: Principal | ICD-10-CM | POA: Diagnosis present

## 2022-01-22 DIAGNOSIS — M549 Dorsalgia, unspecified: Secondary | ICD-10-CM | POA: Diagnosis present

## 2022-01-22 DIAGNOSIS — R7989 Other specified abnormal findings of blood chemistry: Secondary | ICD-10-CM

## 2022-01-22 DIAGNOSIS — Z7982 Long term (current) use of aspirin: Secondary | ICD-10-CM

## 2022-01-22 DIAGNOSIS — Z8249 Family history of ischemic heart disease and other diseases of the circulatory system: Secondary | ICD-10-CM

## 2022-01-22 DIAGNOSIS — F17210 Nicotine dependence, cigarettes, uncomplicated: Secondary | ICD-10-CM | POA: Diagnosis present

## 2022-01-22 DIAGNOSIS — K219 Gastro-esophageal reflux disease without esophagitis: Secondary | ICD-10-CM | POA: Diagnosis present

## 2022-01-22 DIAGNOSIS — I248 Other forms of acute ischemic heart disease: Secondary | ICD-10-CM | POA: Diagnosis present

## 2022-01-22 DIAGNOSIS — E041 Nontoxic single thyroid nodule: Secondary | ICD-10-CM

## 2022-01-22 DIAGNOSIS — I5033 Acute on chronic diastolic (congestive) heart failure: Secondary | ICD-10-CM | POA: Diagnosis present

## 2022-01-22 DIAGNOSIS — Z6837 Body mass index (BMI) 37.0-37.9, adult: Secondary | ICD-10-CM | POA: Diagnosis not present

## 2022-01-22 DIAGNOSIS — I10 Essential (primary) hypertension: Secondary | ICD-10-CM

## 2022-01-22 DIAGNOSIS — F1423 Cocaine dependence with withdrawal: Secondary | ICD-10-CM | POA: Diagnosis present

## 2022-01-22 DIAGNOSIS — E875 Hyperkalemia: Secondary | ICD-10-CM | POA: Diagnosis not present

## 2022-01-22 DIAGNOSIS — D631 Anemia in chronic kidney disease: Secondary | ICD-10-CM | POA: Diagnosis present

## 2022-01-22 DIAGNOSIS — E871 Hypo-osmolality and hyponatremia: Secondary | ICD-10-CM | POA: Diagnosis present

## 2022-01-22 DIAGNOSIS — R079 Chest pain, unspecified: Secondary | ICD-10-CM | POA: Diagnosis not present

## 2022-01-22 DIAGNOSIS — I252 Old myocardial infarction: Secondary | ICD-10-CM

## 2022-01-22 DIAGNOSIS — N189 Chronic kidney disease, unspecified: Secondary | ICD-10-CM | POA: Diagnosis not present

## 2022-01-22 DIAGNOSIS — Z841 Family history of disorders of kidney and ureter: Secondary | ICD-10-CM

## 2022-01-22 DIAGNOSIS — R9431 Abnormal electrocardiogram [ECG] [EKG]: Secondary | ICD-10-CM

## 2022-01-22 DIAGNOSIS — Z79899 Other long term (current) drug therapy: Secondary | ICD-10-CM

## 2022-01-22 DIAGNOSIS — E669 Obesity, unspecified: Secondary | ICD-10-CM | POA: Diagnosis present

## 2022-01-22 DIAGNOSIS — F1410 Cocaine abuse, uncomplicated: Secondary | ICD-10-CM | POA: Diagnosis not present

## 2022-01-22 DIAGNOSIS — K732 Chronic active hepatitis, not elsewhere classified: Secondary | ICD-10-CM | POA: Diagnosis present

## 2022-01-22 DIAGNOSIS — Z7951 Long term (current) use of inhaled steroids: Secondary | ICD-10-CM

## 2022-01-22 DIAGNOSIS — I130 Hypertensive heart and chronic kidney disease with heart failure and stage 1 through stage 4 chronic kidney disease, or unspecified chronic kidney disease: Secondary | ICD-10-CM | POA: Diagnosis present

## 2022-01-22 DIAGNOSIS — N179 Acute kidney failure, unspecified: Secondary | ICD-10-CM | POA: Diagnosis present

## 2022-01-22 DIAGNOSIS — F32A Depression, unspecified: Secondary | ICD-10-CM | POA: Diagnosis present

## 2022-01-22 DIAGNOSIS — G20 Parkinson's disease: Secondary | ICD-10-CM | POA: Diagnosis present

## 2022-01-22 DIAGNOSIS — N1832 Chronic kidney disease, stage 3b: Secondary | ICD-10-CM | POA: Diagnosis present

## 2022-01-22 DIAGNOSIS — R778 Other specified abnormalities of plasma proteins: Secondary | ICD-10-CM | POA: Diagnosis not present

## 2022-01-22 DIAGNOSIS — E872 Acidosis, unspecified: Secondary | ICD-10-CM | POA: Diagnosis present

## 2022-01-22 DIAGNOSIS — Z9851 Tubal ligation status: Secondary | ICD-10-CM

## 2022-01-22 DIAGNOSIS — E7800 Pure hypercholesterolemia, unspecified: Secondary | ICD-10-CM | POA: Diagnosis not present

## 2022-01-22 DIAGNOSIS — F419 Anxiety disorder, unspecified: Secondary | ICD-10-CM | POA: Diagnosis present

## 2022-01-22 DIAGNOSIS — Z91148 Patient's other noncompliance with medication regimen for other reason: Secondary | ICD-10-CM

## 2022-01-22 DIAGNOSIS — J9601 Acute respiratory failure with hypoxia: Secondary | ICD-10-CM | POA: Diagnosis not present

## 2022-01-22 DIAGNOSIS — G8929 Other chronic pain: Secondary | ICD-10-CM | POA: Diagnosis present

## 2022-01-22 DIAGNOSIS — Z888 Allergy status to other drugs, medicaments and biological substances status: Secondary | ICD-10-CM

## 2022-01-22 DIAGNOSIS — I493 Ventricular premature depolarization: Secondary | ICD-10-CM | POA: Diagnosis present

## 2022-01-22 DIAGNOSIS — Z7984 Long term (current) use of oral hypoglycemic drugs: Secondary | ICD-10-CM

## 2022-01-22 DIAGNOSIS — Z7902 Long term (current) use of antithrombotics/antiplatelets: Secondary | ICD-10-CM

## 2022-01-22 DIAGNOSIS — E1169 Type 2 diabetes mellitus with other specified complication: Secondary | ICD-10-CM

## 2022-01-22 DIAGNOSIS — E119 Type 2 diabetes mellitus without complications: Secondary | ICD-10-CM

## 2022-01-22 DIAGNOSIS — Z880 Allergy status to penicillin: Secondary | ICD-10-CM

## 2022-01-22 LAB — TROPONIN I (HIGH SENSITIVITY)
Troponin I (High Sensitivity): 50 ng/L — ABNORMAL HIGH (ref <18)
Troponin I (High Sensitivity): 53 ng/L — ABNORMAL HIGH (ref <18)
Troponin I (High Sensitivity): 40 ng/L — ABNORMAL HIGH (ref <18)

## 2022-01-22 LAB — CBC
HCT: 34.4 % — ABNORMAL LOW (ref 36.0–46.0)
Hemoglobin: 11.3 g/dL — ABNORMAL LOW (ref 12.0–15.0)
MCH: 29.5 pg (ref 26.0–34.0)
MCHC: 32.8 g/dL (ref 30.0–36.0)
MCV: 89.8 fL (ref 80.0–100.0)
Platelets: 214 10*3/uL (ref 150–400)
RBC: 3.83 MIL/uL — ABNORMAL LOW (ref 3.87–5.11)
RDW: 13.1 % (ref 11.5–15.5)
WBC: 6.3 10*3/uL (ref 4.0–10.5)
nRBC: 0 % (ref 0.0–0.2)

## 2022-01-22 LAB — COMPREHENSIVE METABOLIC PANEL
ALT: 13 U/L (ref 0–44)
AST: 31 U/L (ref 15–41)
Albumin: 2.7 g/dL — ABNORMAL LOW (ref 3.5–5.0)
Alkaline Phosphatase: 123 U/L (ref 38–126)
Anion gap: 14 (ref 5–15)
BUN: 10 mg/dL (ref 6–20)
CO2: 16 mmol/L — ABNORMAL LOW (ref 22–32)
Calcium: 8.9 mg/dL (ref 8.9–10.3)
Chloride: 110 mmol/L (ref 98–111)
Creatinine, Ser: 1.79 mg/dL — ABNORMAL HIGH (ref 0.44–1.00)
GFR, Estimated: 32 mL/min — ABNORMAL LOW (ref >60)
Glucose, Bld: 132 mg/dL — ABNORMAL HIGH (ref 70–99)
Potassium: 3.6 mmol/L (ref 3.5–5.1)
Sodium: 140 mmol/L (ref 135–145)
Total Bilirubin: 0.4 mg/dL (ref 0.3–1.2)
Total Protein: 6.8 g/dL (ref 6.5–8.1)

## 2022-01-22 LAB — GLUCOSE, CAPILLARY: Glucose-Capillary: 79 mg/dL (ref 70–99)

## 2022-01-22 LAB — BRAIN NATRIURETIC PEPTIDE: B Natriuretic Peptide: 618.4 pg/mL — ABNORMAL HIGH (ref 0.0–100.0)

## 2022-01-22 LAB — CBG MONITORING, ED: Glucose-Capillary: 74 mg/dL (ref 70–99)

## 2022-01-22 MED ORDER — ENOXAPARIN SODIUM 40 MG/0.4ML IJ SOSY
40.0000 mg | PREFILLED_SYRINGE | INTRAMUSCULAR | Status: DC
  Administered 2022-01-22 – 2022-01-23 (×2): 40 mg via SUBCUTANEOUS
  Filled 2022-01-22 (×3): qty 0.4

## 2022-01-22 MED ORDER — PANTOPRAZOLE SODIUM 40 MG PO TBEC
40.0000 mg | DELAYED_RELEASE_TABLET | Freq: Every morning | ORAL | Status: DC
  Administered 2022-01-22 – 2022-02-04 (×13): 40 mg via ORAL
  Filled 2022-01-22 (×13): qty 1

## 2022-01-22 MED ORDER — HYDRALAZINE HCL 20 MG/ML IJ SOLN
20.0000 mg | INTRAMUSCULAR | Status: AC
Start: 2022-01-22 — End: 2022-01-22
  Administered 2022-01-22: 20 mg via INTRAVENOUS
  Filled 2022-01-22: qty 1

## 2022-01-22 MED ORDER — GABAPENTIN 100 MG PO CAPS
100.0000 mg | ORAL_CAPSULE | Freq: Every morning | ORAL | Status: DC
  Administered 2022-01-22 – 2022-02-04 (×14): 100 mg via ORAL
  Filled 2022-01-22 (×14): qty 1

## 2022-01-22 MED ORDER — LORATADINE 10 MG PO TABS
10.0000 mg | ORAL_TABLET | Freq: Every day | ORAL | Status: DC
  Administered 2022-01-22 – 2022-02-04 (×12): 10 mg via ORAL
  Filled 2022-01-22 (×12): qty 1

## 2022-01-22 MED ORDER — IOHEXOL 350 MG/ML SOLN
60.0000 mL | Freq: Once | INTRAVENOUS | Status: AC | PRN
  Administered 2022-01-22: 60 mL via INTRAVENOUS

## 2022-01-22 MED ORDER — INSULIN ASPART 100 UNIT/ML IJ SOLN
0.0000 [IU] | Freq: Three times a day (TID) | INTRAMUSCULAR | Status: DC
  Administered 2022-01-23 (×2): 3 [IU] via SUBCUTANEOUS
  Administered 2022-01-23: 2 [IU] via SUBCUTANEOUS
  Administered 2022-01-24: 5 [IU] via SUBCUTANEOUS
  Administered 2022-01-24 (×2): 3 [IU] via SUBCUTANEOUS
  Administered 2022-01-25: 2 [IU] via SUBCUTANEOUS
  Administered 2022-01-26 (×3): 3 [IU] via SUBCUTANEOUS
  Administered 2022-01-27: 5 [IU] via SUBCUTANEOUS
  Administered 2022-01-28 (×2): 3 [IU] via SUBCUTANEOUS
  Administered 2022-01-28 – 2022-01-30 (×7): 2 [IU] via SUBCUTANEOUS
  Administered 2022-01-31: 3 [IU] via SUBCUTANEOUS
  Administered 2022-02-01: 2 [IU] via SUBCUTANEOUS
  Administered 2022-02-01: 5 [IU] via SUBCUTANEOUS
  Administered 2022-02-02: 3 [IU] via SUBCUTANEOUS
  Administered 2022-02-02: 5 [IU] via SUBCUTANEOUS
  Administered 2022-02-03 – 2022-02-04 (×3): 3 [IU] via SUBCUTANEOUS

## 2022-01-22 MED ORDER — NITROGLYCERIN 0.4 MG SL SUBL
0.4000 mg | SUBLINGUAL_TABLET | SUBLINGUAL | Status: DC | PRN
  Administered 2022-01-22 (×2): 0.4 mg via SUBLINGUAL
  Filled 2022-01-22: qty 1

## 2022-01-22 MED ORDER — INSULIN ASPART 100 UNIT/ML IJ SOLN
10.0000 [IU] | Freq: Three times a day (TID) | INTRAMUSCULAR | Status: DC
Start: 2022-01-22 — End: 2022-02-04
  Administered 2022-01-23 – 2022-02-04 (×26): 10 [IU] via SUBCUTANEOUS

## 2022-01-22 MED ORDER — ASPIRIN 81 MG PO TBEC
81.0000 mg | DELAYED_RELEASE_TABLET | Freq: Every day | ORAL | Status: DC
  Administered 2022-01-22 – 2022-02-04 (×14): 81 mg via ORAL
  Filled 2022-01-22 (×14): qty 1

## 2022-01-22 MED ORDER — CYCLOBENZAPRINE HCL 10 MG PO TABS
10.0000 mg | ORAL_TABLET | Freq: Three times a day (TID) | ORAL | Status: DC | PRN
  Administered 2022-01-22 – 2022-01-23 (×2): 10 mg via ORAL
  Filled 2022-01-22 (×2): qty 1

## 2022-01-22 MED ORDER — FUROSEMIDE 10 MG/ML IJ SOLN
40.0000 mg | Freq: Every day | INTRAMUSCULAR | Status: DC
  Administered 2022-01-23: 40 mg via INTRAVENOUS
  Filled 2022-01-22: qty 4

## 2022-01-22 MED ORDER — QUETIAPINE FUMARATE ER 300 MG PO TB24
300.0000 mg | ORAL_TABLET | Freq: Every day | ORAL | Status: DC
  Administered 2022-01-22 – 2022-02-03 (×13): 300 mg via ORAL
  Filled 2022-01-22 (×15): qty 1

## 2022-01-22 MED ORDER — MOMETASONE FURO-FORMOTEROL FUM 200-5 MCG/ACT IN AERO
2.0000 | INHALATION_SPRAY | Freq: Two times a day (BID) | RESPIRATORY_TRACT | Status: DC
  Administered 2022-01-23 – 2022-02-04 (×25): 2 via RESPIRATORY_TRACT
  Filled 2022-01-22: qty 8.8

## 2022-01-22 MED ORDER — ALBUTEROL SULFATE (2.5 MG/3ML) 0.083% IN NEBU
2.5000 mg | INHALATION_SOLUTION | RESPIRATORY_TRACT | Status: DC | PRN
  Administered 2022-01-26: 2.5 mg via RESPIRATORY_TRACT
  Filled 2022-01-22 (×2): qty 3

## 2022-01-22 MED ORDER — INSULIN GLARGINE-YFGN 100 UNIT/ML ~~LOC~~ SOLN
20.0000 [IU] | Freq: Every day | SUBCUTANEOUS | Status: DC
  Administered 2022-01-23 – 2022-02-04 (×13): 20 [IU] via SUBCUTANEOUS
  Filled 2022-01-22 (×13): qty 0.2

## 2022-01-22 MED ORDER — FUROSEMIDE 10 MG/ML IJ SOLN
40.0000 mg | Freq: Once | INTRAMUSCULAR | Status: AC
  Administered 2022-01-22: 40 mg via INTRAVENOUS
  Filled 2022-01-22: qty 4

## 2022-01-22 MED ORDER — FUROSEMIDE 10 MG/ML IJ SOLN: 40.0000 mg | Freq: Two times a day (BID) | INTRAMUSCULAR | Status: DC

## 2022-01-22 MED ORDER — ATORVASTATIN CALCIUM 80 MG PO TABS
80.0000 mg | ORAL_TABLET | Freq: Every morning | ORAL | Status: DC
  Administered 2022-01-22 – 2022-02-04 (×14): 80 mg via ORAL
  Filled 2022-01-22 (×12): qty 1
  Filled 2022-01-22: qty 2
  Filled 2022-01-22: qty 1

## 2022-01-22 MED ORDER — SERTRALINE HCL 50 MG PO TABS
50.0000 mg | ORAL_TABLET | Freq: Every evening | ORAL | Status: DC
  Administered 2022-01-22 – 2022-02-03 (×12): 50 mg via ORAL
  Filled 2022-01-22 (×13): qty 1

## 2022-01-22 MED ORDER — NITROGLYCERIN IN D5W 200-5 MCG/ML-% IV SOLN
0.0000 ug/min | INTRAVENOUS | Status: DC
  Administered 2022-01-22: 5 ug/min via INTRAVENOUS

## 2022-01-22 MED ORDER — LORAZEPAM 2 MG/ML IJ SOLN
0.5000 mg | INTRAMUSCULAR | Status: DC | PRN
  Administered 2022-01-22: 0.5 mg via INTRAVENOUS
  Filled 2022-01-22: qty 1

## 2022-01-22 MED ORDER — AMLODIPINE BESYLATE 10 MG PO TABS
10.0000 mg | ORAL_TABLET | Freq: Every morning | ORAL | Status: DC
  Administered 2022-01-22 – 2022-02-04 (×13): 10 mg via ORAL
  Filled 2022-01-22 (×12): qty 1
  Filled 2022-01-22: qty 2

## 2022-01-22 MED ORDER — VITAMIN D (ERGOCALCIFEROL) 1.25 MG (50000 UNIT) PO CAPS
50000.0000 [IU] | ORAL_CAPSULE | ORAL | Status: DC
  Administered 2022-01-22 – 2022-01-29 (×2): 50000 [IU] via ORAL
  Filled 2022-01-22 (×3): qty 1

## 2022-01-22 MED ORDER — CLOPIDOGREL BISULFATE 75 MG PO TABS
75.0000 mg | ORAL_TABLET | Freq: Every morning | ORAL | Status: DC
  Administered 2022-01-22 – 2022-02-04 (×14): 75 mg via ORAL
  Filled 2022-01-22 (×14): qty 1

## 2022-01-22 MED ORDER — HYDRALAZINE HCL 50 MG PO TABS
100.0000 mg | ORAL_TABLET | Freq: Three times a day (TID) | ORAL | Status: DC
Start: 2022-01-22 — End: 2022-01-23
  Administered 2022-01-22 – 2022-01-23 (×4): 100 mg via ORAL
  Filled 2022-01-22 (×2): qty 2
  Filled 2022-01-22: qty 4
  Filled 2022-01-22: qty 2

## 2022-01-22 MED ORDER — HYDROXYZINE HCL 25 MG PO TABS
50.0000 mg | ORAL_TABLET | Freq: Every day | ORAL | Status: DC | PRN
  Administered 2022-01-22 – 2022-01-28 (×4): 50 mg via ORAL
  Filled 2022-01-22 (×4): qty 2

## 2022-01-22 MED ORDER — FUROSEMIDE 10 MG/ML IJ SOLN
80.0000 mg | Freq: Once | INTRAMUSCULAR | Status: AC
  Administered 2022-01-22: 80 mg via INTRAVENOUS
  Filled 2022-01-22: qty 8

## 2022-01-22 NOTE — Assessment & Plan Note (Signed)
S/p NSTEMI 12/20/21 -continue aspirin and plavix

## 2022-01-22 NOTE — Assessment & Plan Note (Signed)
Continue statin. 

## 2022-01-22 NOTE — Assessment & Plan Note (Addendum)
-  questionable adherence since pt was hypertensive during her last 2 admission and would be controlled at discharged following start of her antihypertensive. Also could be induced by cocaine use with her hx. Check UDS today.  -Continue amlodipine 10 mg -increase hydralazine from 50mg  TID to 100mg  TID -on IV Lasix 40mg  BID  - Hold nebivolol pending UDS given recent hx of cocaine use.  -Hold olmesartan-HCTZ due to AKI - Hold Imdur 30mg  while on nitroglycerin infusion -continue nitroglycerine and titrate to goal of 160/95 in the first 24 hours.  -cardiology is following

## 2022-01-22 NOTE — ED Notes (Signed)
Pt sleeping, pt was having a lot of pain and anxiety earlier and asked to not be woken up if she was able to fall asleep unless it was urgent, holding less time sensitive oral meds at this time

## 2022-01-22 NOTE — Assessment & Plan Note (Signed)
Has AKI with creatinine of 1.79 from a prior of 1.47.  -hold home olmesartan-HCTZ  -However she is on IV diuretics for CHF exacerbation so we will need to monitor closely

## 2022-01-22 NOTE — Assessment & Plan Note (Addendum)
Insulin-dependent.A1C of 6.4 on 5/13 -home regimen on 30 Units Lantus and 15 units Humalog TID -start with 20 units in the morning and 10units TID with moderate SSI

## 2022-01-22 NOTE — Assessment & Plan Note (Signed)
Check UDS. Denies use but last positive UDS on 12/31/21.

## 2022-01-22 NOTE — Assessment & Plan Note (Signed)
QTc of 514 on EKG.  Avoid QT prolongation medication.

## 2022-01-22 NOTE — Consult Note (Signed)
Cardiology Consultation:   Patient ID: Carol [name] MRN: [id number]; DOB: September 23, 1960  Admit date: 01/22/2022 Date of Consult: 01/22/2022  PCP:  Simona Huh, NP   [facility] Providers Cardiologist:  Janina Mayo, MD    Patient Profile:   Carol [name] is a 61 y.o. female with a hx of hypertension, HLD, type 2 DM, remote cocaine use (last positive in June 2015), who is being seen 01/22/2022 for the evaluation of chest pain at the request of Dr. Jeanell Sparrow.  History of Present Illness:   Carol [name] is a 61 year old female with above medical history who is followed by Dr. Harl Bowie. Per char review, patient does not have any family history of early CAD, however she does have significant family history of high blood pressure and diabetes. She was recently seen by cardiology on 11/26/2021 when she was in the ER for evaluation of chest pain. Trops were minimally elevated from 44>>48. Patient was also found to have an elevated BP, 197/88. BP medications were adjusted and patient was scheduled for outpatient follow up.   Patient was again seen in the ED on 12/20/21 for evaluation of chest pressure. hsTn were elevated from 1895>>8013. Echo on 12/20/21 showed EF 60-65%, no regional wall motion abnormalities, grade I diastolic dysfunction. LHC on 12/20/21 showed an inferior septal lesion that was 100% stenosed (culprit vessel, 1.5 mm vessel that supplied a small portion of myocardium), mid RCA lesion that was 20% stenosed, mid LAD lesion that was 20% stenosed. Recommended medical management with Plavix, ASA, BB, statin. Patient's BP was elevated that admission, patient was discharged on amlodipine 10 mg daily, hyrdalazine 25 mg TID, Imdur 30 mg daily, nebivolol 20 mg daily.  Patient presented to the ED on 6/5 complaining of chest pain, palpitations. Initial vital signs showed a BP 210/130, HR 102 BPM. Labs in the ED showed Na 140, K 3.6, creatinine 1.79, WBC 6.3, hemoglobin 11.3, platelets 214. BNP  elevated to 618.4. hsTn 50.   EKG showed sinus rhythm, PVC, prolonged QT (QT/QTcB 406/514 ms).  CXR showed mild cardiomegaly, pulmonary vascular congestion and mild interstitial prominence that may reflect edema.   On interview, patient reports that she has been having intermittent chest pain and sob since she was first seen by cardiology in April 2023. Reports that the chest pain feels like a knot under her breast. Radiates to neck, arms, back. Similar to pain she had prior to cath in May 2023, but slightly more severe. Associated with palpitations. Reports that the pain is worse when she drinks alcohol. Reproducible on palpation of the chest and worse with deep inhalation. So far, has not been relieved by nitroglycerine. Patient also complains of SOB that is worse when she lays flat, improved when she sits up.   Per son, patient has been drinking significant amounts of alcohol and has chest pain every week associated with her drinking. Also has continued to smoke cigarettes. Patient does take her medications, but she does not take them at their scheduled times but rather takes them all at once.   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   Chronic back pain    Depression    Diabetes mellitus    GERD (gastroesophageal reflux disease)    Heart murmur    Hypertension  NSTEMI (non-ST elevated myocardial infarction) ([place]) 12/20/2021   Palpitations    Parkinson's disease ([place])     Past Surgical History:  Procedure Laterality Date   CARPAL TUNNEL RELEASE     LEFT HEART CATH AND CORONARY ANGIOGRAPHY N/A 12/20/2021   Procedure: LEFT HEART CATH AND CORONARY ANGIOGRAPHY;  Surgeon: Burnell Blanks, MD;  Location: [facility];  Service: Cardiovascular;  Laterality: N/A;   OPEN REDUCTION INTERNAL FIXATION  (ORIF) FINGER WITH RADIAL BONE GRAFT Right 09/12/2015   Procedure: RIGHT MIDDLE FINGER CLOSED REDUCTION AND PINNING;  Surgeon: Iran Planas, MD;  Location: [facility];  Service: Orthopedics;  Laterality: Right;   TONSILLECTOMY     TUBAL LIGATION       Home Medications:  Prior to Admission medications   Medication Sig Start Date End Date Taking? Authorizing Provider  albuterol (PROVENTIL HFA;VENTOLIN HFA) 108 (90 Base) MCG/ACT inhaler Inhale 2 puffs into the lungs every 4 (four) hours as needed for wheezing or shortness of breath.   Yes [provider]  amLODipine (NORVASC) 10 MG tablet Take 1 tablet (10 mg total) by mouth daily. Patient taking differently: Take 10 mg by mouth every morning. 11/26/21  Yes Varney Biles, MD  aspirin EC 81 MG EC tablet Take 1 tablet (81 mg total) by mouth daily. Swallow whole. Patient taking differently: Take 81 mg by mouth at bedtime. Swallow whole. 12/25/21  Yes Dunn, Dayna N, PA-C  atorvastatin (LIPITOR) 80 MG tablet Take 1 tablet (80 mg total) by mouth daily. Patient taking differently: Take 80 mg by mouth every morning. 12/25/21  Yes Dunn, Dayna N, PA-C  baclofen (LIORESAL) 10 MG tablet Take 10 mg by mouth daily as needed for muscle spasms.   Yes [provider]  budesonide-formoterol (SYMBICORT) 160-4.5 MCG/ACT inhaler Inhale 2 puffs into the lungs every morning. 11/09/21  Yes [provider]  cetirizine (ZYRTEC) 10 MG tablet Take 10 mg by mouth every morning.   Yes [provider]  clopidogrel (PLAVIX) 75 MG tablet Take 1 tablet (75 mg total) by mouth daily. Patient taking differently: Take 75 mg by mouth every morning. 12/25/21  Yes Dunn, Dayna N, PA-C  cyclobenzaprine (FLEXERIL) 10 MG tablet Take 1 tablet (10 mg total) by mouth 3 (three) times daily as needed for muscle spasms. 05/18/19  Yes Domenic Moras, PA-C  ergocalciferol (VITAMIN D2) 1.25 MG (50000 UT) capsule Take 50,000 Units by mouth every Monday.   Yes [provider]  furosemide (LASIX) 40 MG tablet Take 1 tablet (40 mg total) by mouth 2 (two) times daily. Patient taking differently: Take 40 mg by mouth 2 (two) times daily with breakfast and lunch. 01/03/22  Yes Shelly Coss, MD  gabapentin (NEURONTIN) 100 MG capsule Take 100 mg by mouth every morning.   Yes [provider]  glimepiride (AMARYL) 4 MG tablet Take 4 mg by mouth 2 (two) times daily. 01/12/22  Yes [provider]  hydrALAZINE (APRESOLINE) 50 MG tablet Take 1 tablet (50 mg total) by mouth every 8 (eight) hours. 01/03/22  Yes Shelly Coss, MD  hydrOXYzine (ATARAX) 50 MG tablet Take 50 mg by mouth daily as needed for anxiety or itching.   Yes [provider]  insulin glargine (LANTUS) 100 UNIT/ML Solostar Pen Inject 30 Units into the skin every morning.   Yes [provider]  insulin lispro (HUMALOG) 100 UNIT/ML KwikPen Inject 15 Units into the skin 3 (three) times daily after meals. 01/05/22  Yes [provider]  isosorbide mononitrate (IMDUR) 30 MG 24 hr tablet Take 1 tablet (30 mg total) by mouth daily. Patient taking differently: Take 30 mg by mouth every morning. 12/25/21  Yes Dunn, Dayna N, PA-C  metFORMIN (GLUCOPHAGE) 500 MG tablet Take 500 mg by mouth 2 (two) times daily.   Yes [provider]  Nebivolol HCl 20 MG TABS Take 20 mg by mouth every morning. 12/07/21  Yes [provider]  nitroGLYCERIN (NITROSTAT) 0.4 MG SL tablet Place 1 tablet (0.4 mg total) under the tongue every 5 (five) minutes as needed for chest pain (up to 3 doses). 12/24/21  Yes Dunn, Nedra Hai, PA-C  olmesartan-hydrochlorothiazide (BENICAR HCT) 40-25 MG tablet Take 1 tablet by mouth every morning. 01/12/22  Yes [provider]  pantoprazole (PROTONIX) 40 MG tablet Take 1 tablet (40 mg total) by mouth daily at 12 noon. Patient taking differently: Take 40 mg by mouth every morning. 02/02/14  Yes Regalado, Belkys A, MD  QUEtiapine (SEROQUEL XR) 300 MG 24 hr  tablet Take 300 mg by mouth at bedtime. 12/07/21  Yes [provider]  sertraline (ZOLOFT) 50 MG tablet Take 50 mg by mouth every evening.   Yes [provider]  triamcinolone ointment (KENALOG) 0.1 % Apply 1 application. topically 2 (two) times daily.   Yes [provider]  glucose blood (FREESTYLE TEST STRIPS) test strip 1 each by Other route as needed for other. 01/03/22   Shelly Coss, MD  oxyCODONE-acetaminophen (PERCOCET/ROXICET) 5-325 MG tablet Take 1-2 tablets by mouth every 6 (six) hours as needed for severe pain. Patient not taking: Reported on 01/22/2022 08/31/15   Comer Locket, PA-C    Inpatient Medications: Scheduled Meds:  amLODipine  10 mg Oral q morning   aspirin EC  81 mg Oral Daily   atorvastatin  80 mg Oral q morning   clopidogrel  75 mg Oral q morning   enoxaparin (LOVENOX) injection  40 mg Subcutaneous Q24H   [START ON 01/23/2022] furosemide  40 mg Intravenous BID   gabapentin  100 mg Oral q morning   hydrALAZINE  100 mg Oral Q8H   insulin aspart  0-15 Units Subcutaneous TID WC   insulin aspart  10 Units Subcutaneous TID WC   [START ON 01/23/2022] insulin glargine-yfgn  20 Units Subcutaneous Daily   loratadine  10 mg Oral Daily   mometasone-formoterol  2 puff Inhalation BID   pantoprazole  40 mg Oral q morning   QUEtiapine  300 mg Oral QHS   sertraline  50 mg Oral QPM   Vitamin D (Ergocalciferol)  50,000 Units Oral Q Mon   Continuous Infusions:  nitroGLYCERIN 130 mcg/min (01/22/22 1330)   PRN Meds: albuterol, cyclobenzaprine, hydrOXYzine, nitroGLYCERIN  Allergies:    Allergies  Allergen Reactions   Penicillins Shortness Of Breath and Other (See Comments)    Caused yeast infection Has patient had a PCN reaction causing immediate rash, facial/tongue/throat swelling, SOB or lightheadedness with hypotension: Yes Has patient had a PCN reaction causing severe rash involving mucus membranes or skin necrosis: No Has patient had a PCN  reaction that required hospitalization pt was in the hospital at the time of the reaction Has patient had a PCN reaction occurring within the last 10 years: No If all of the above answers are "NO", then may proceed with Cephalosp   Tramadol Other (See Comments)    Made her tongue raw    Social History:   Social History   Socioeconomic History   Marital status: Single  Spouse name: Not on file   Number of children: 3   Years of education: Not on file   Highest education level: Not on file  Occupational History   Not on file  Tobacco Use   Smoking status: Some Days    Packs/day: 0.25    Years: 3.00    Pack years: 0.75    Types: Cigarettes   Smokeless tobacco: Never  Vaping Use   Vaping Use: Never used  Substance and Sexual Activity   Alcohol use: Yes    Alcohol/week: 0.0 standard drinks    Comment: occasional   Drug use: No    Comment: none for 13 years   Sexual activity: Not on file  Other Topics Concern   Not on file  Social History Narrative   Not on file   Social Determinants of Health   Financial Resource Strain: Not on file  Food Insecurity: Not on file  Transportation Needs: Not on file  Physical Activity: Not on file  Stress: Not on file  Social Connections: Not on file  Intimate Partner Violence: Not on file    Family History:    Family History  Problem Relation Age of Onset   Diabetes Mother    Kidney disease Mother    Seizures Father    Hypertension Father    Stroke Father    Asthma Other      ROS:  Please see the history of present illness.   All other ROS reviewed and negative.     Physical Exam/Data:   Vitals:   01/22/22 1420 01/22/22 1425 01/22/22 1430 01/22/22 1435  BP: (!) 178/93 (!) 182/89 (!) 202/111 (!) 192/111  Pulse: (!) 102 (!) 103 (!) 114 (!) 102  Resp: 20 18 19 20   Temp:      TempSrc:      SpO2: 100% 99% 99% 99%  Weight:      Height:       No intake or output data in the 24 hours ending 01/22/22 1512    01/22/2022     8:09 AM 01/02/2022    5:00 AM 12/30/2021   11:39 PM  Last 3 Weights  Weight (lbs) 216 lb 11.4 oz 216 lb 11.4 oz 170 lb  Weight (kg) 98.3 kg 98.3 kg 77.111 kg     Body mass index is 37.2 kg/m.  General:  Anxious appearing, middle aged woman in no acute distress. Wearing nasal cannula on 2 L/min HEENT: normal Neck: no JVD Vascular: Radial pulses 2+ bilaterally Cardiac:  normal S1, S2; RRR; no murmur. Chest wall is tender to palpation over the apex Lungs:  decreased breath sounds in lung bases, crackles heard mid lung  Abd: soft, nontender, no hepatomegaly  Ext: visible edema, patient complains of extreme tenderness to palpation in lower extremity's  Musculoskeletal:  No deformities, BUE and BLE strength normal and equal Skin: warm and dry  Neuro:  CNs 2-12 intact, no focal abnormalities noted Psych:  Normal affect   EKG:  The EKG was personally reviewed and demonstrates:   Telemetry:  Telemetry was personally reviewed and demonstrates:  Sinus rhythm, PVC, prolonged QT interval (QT/QTcB 406/514 ms)  Relevant CV Studies:  Echocardiogram 12/20/21  1. Left ventricular ejection fraction, by estimation, is 60 to 65%. The  left ventricle has normal function. The left ventricle has no regional  wall motion abnormalities. There is mild left ventricular hypertrophy.  Left ventricular diastolic parameters  are consistent with Grade I diastolic dysfunction (impaired relaxation).  2. Right ventricular systolic function is normal. The right ventricular  size is normal.   3. Left atrial size was mildly dilated.   4. The mitral valve is normal in structure. Trivial mitral valve  regurgitation. No evidence of mitral stenosis.   5. The aortic valve is normal in structure. Aortic valve regurgitation is  not visualized. No aortic stenosis is present.   6. The inferior vena cava is normal in size with greater than 50%  respiratory variability, suggesting right atrial pressure of 3 mmHg.   Left  Heart Cath 12/20/21   Inf Sept lesion is 100% stenosed.   Mid RCA lesion is 20% stenosed.   Mid LAD lesion is 20% stenosed.   NSTEMI with culprit vessel felt to be a very small sub-branch off of the right PDA. This is a 1.5 mm vessel and supplies a small portion of myocardium.  Mild disease in the mid LAD and mid RCA LVEDP=15 mmHg   Recommendations: Medical management of CAD. I will start Plavix and continue ASA, beta blocker and statin.    Diagnostic Dominance: Right   Laboratory Data:  High Sensitivity Troponin:   Recent Labs  Lab 12/30/21 2342 01/10/22 0946 01/10/22 1226 01/22/22 0845 01/22/22 1348  TROPONINIHS 182* 39* 31* 50* 53*     Chemistry Recent Labs  Lab 01/22/22 0845  NA 140  K 3.6  CL 110  CO2 16*  GLUCOSE 132*  BUN 10  CREATININE 1.79*  CALCIUM 8.9  GFRNONAA 32*  ANIONGAP 14    Recent Labs  Lab 01/22/22 0845  PROT 6.8  ALBUMIN 2.7*  AST 31  ALT 13  ALKPHOS 123  BILITOT 0.4   Lipids No results for input(s): CHOL, TRIG, HDL, LABVLDL, LDLCALC, CHOLHDL in the last 168 hours.  Hematology Recent Labs  Lab 01/22/22 0845  WBC 6.3  RBC 3.83*  HGB 11.3*  HCT 34.4*  MCV 89.8  MCH 29.5  MCHC 32.8  RDW 13.1  PLT 214   Thyroid No results for input(s): TSH, FREET4 in the last 168 hours.  BNP Recent Labs  Lab 01/22/22 0845  BNP 618.4*    DDimer No results for input(s): DDIMER in the last 168 hours.   Radiology/Studies:  DG Chest 1 View  Result Date: 01/22/2022 CLINICAL DATA:  Headache, chest pain, shortness of breath EXAM: CHEST  1 VIEW COMPARISON:  01/10/2022 FINDINGS: Similar pulmonary vascular congestion and mild interstitial prominence. No pleural effusion. No pneumothorax. Stable mild cardiomegaly. IMPRESSION: Mild cardiomegaly. Pulmonary vascular congestion and mild interstitial prominence that may reflect edema. Electronically Signed   By: Macy Mis M.D.   On: 01/22/2022 09:42   CT Head Wo Contrast  Result Date:  01/22/2022 CLINICAL DATA:  Head trauma, abnormal mental status (Age 53-64y) EXAM: CT HEAD WITHOUT CONTRAST TECHNIQUE: Contiguous axial images were obtained from the base of the skull through the vertex without intravenous contrast. RADIATION DOSE REDUCTION: This exam was performed according to the departmental dose-optimization program which includes automated exposure control, adjustment of the mA and/or kV according to patient size and/or use of iterative reconstruction technique. COMPARISON:  12/30/2021 FINDINGS: Brain: There is no acute intracranial hemorrhage, mass effect, or edema. No new loss of gray-white differentiation. 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 Ventricles and sulci are stable in size and configuration. No extra-axial collection. Vascular: There is atherosclerotic calcification at the skull base. Skull: Calvarium is unremarkable. Sinuses/Orbits: No acute finding. Other: None. IMPRESSION: No acute  intracranial abnormality. Stable chronic/nonemergent findings detailed above. Electronically Signed   By: Macy Mis M.D.   On: 01/22/2022 09:36     Assessment and Plan:   Chest Pain  CAD  - Patient complains of chest pain that is reproducible to palpation, worse with deep inhalation, not improved with nitroglycerine, associated with SOB, palpitations. Overall, this is atypical chest pain  - hsTn 50>>53  - EKG without ischemic changes  - Patient recently had a LHC (12/20/21) with 100% occlusion of a very small sub-branch off of the right PDA, 20 % occlusion of mid RCA and mild LAD. It is unlikely that her mild disease in the mid LAD and mid RCA have progressed significantly in the past month  - Hold imdur 30 mg daily as patient is on IV nitro, plan to restart and titrate once off nitro drip  - Hold nebivolol for now pending UDS, history of cocaine use (positive for cocaine on 12/31/21). If cocaine positive, consider transition to  carvedilol as it is safer in cocaine use although patient does have asthma. Will discuss with MD   Elevated Troponin  - hsTn 50>>53  - Minimal elevation with flat trend-- not consistent with ACS. Suspect demand ischemia in the setting of hypertensive urgency   Hypertensive  - BP was 210/130 on admission  - Continue amlodipine  - Increased hydralazine to 100 mg TID - Holding nebivolol due to cocaine use, holding olmesartan-HCTZ due to AKI, holding Imdur due to nitroglycerine infusion   Acute on Chronic Diastolic CHF  - Most recent echocardiogram on 12/20/21 showed EF 53-97%, grade I diastolic dysfunction  - CXR showed vascular congestion and pulmonary edema  - BNP elevated to 618  - Patient complains of SOB, orthopnea, pedal edema - Given IV lasix 80 mg x1 so far in the ED, now on 40 mg IV BID  - Strict I/Os, daily weights  - Home olmesartan-HCTZ held due to AKI on CKD - Continue home hydralazine   Prolonged QT interval  - EKG this admission with QT/QTcB 406/514 ms - Avoid QT prolonging agents, repeat EKG in the morning   Otherwise per primary  - Cocaine abuse  - Possible alcohol abuse  - Type 2 DM - AKI on CKD  - Asthma   Risk Assessment/Risk Scores:     HEAR Score (for undifferentiated chest pain):  HEAR Score: 3{  New York Heart Association (NYHA) Functional Class NYHA Class II        For questions or updates, please contact [facility] Please consult [email or web address] for contact info under    Signed, Margie Billet, PA-C  01/22/2022 3:12 PM

## 2022-01-22 NOTE — Assessment & Plan Note (Signed)
CXR showing vascular congestion and pulmonary edema.  BNP elevated to 618. -On lasix 40mg  BID at home -received IV 80mg  in ED -start IV 40mg  BID here -monitor intake and output, daily weights

## 2022-01-22 NOTE — H&P (Addendum)
History and Physical    Patient: Carol [name] ATF:[id number] DOB: [birth year] DOA: 01/22/2022 DOS: the patient was seen and examined on 01/22/2022 PCP: Simona Huh, NP  Patient coming from: Home  Chief Complaint:  Chief Complaint  Patient presents with   Chest Pain   HPI: Carol [name] is a 61 y.o. female with medical history significant of uncontrolled HTN, insulin-dependent Type 2DM, CKD 3b, CAD s/p recent NSTEMI, and cocaine use who presents with chest pain and increasing shortness of breath.  Patient had sister at bedside.  She lives alone and reports that she woke up this morning with severe constant left-sided tight chest pain beneath her breast.  Also had shortness of breath waking her up out of her sleep.  Has had gradual increasing lower extremity edema since her last admission several weeks ago.  She was recently admitted 5/13-5/17 for hypoglycemia after she continued to take her insulin regimen despite being hypoglycemia earlier. Later developed acute hypoxic respiratory failure from fluid overload from receiving IV dextrose.  She was also noted to be hypertensive to SBP of 200 and had increase to hydralazine and addition of Lasix.  Prior to this she was admitted from 5/3-5/7 for NSTEMI and had LHC of 5/3 showing 100% stenosis of a small sub-branch of the right PDA. Medical managment advised by cardiology and started on Plavix in addition to home aspirin, beta-blocker and statin. She was noted to be hypertensive at that time as well and discharged home on amlodipine, hydralazine TID, Imdur and nebivolol.   Patient reports that she has been taking her blood pressure medicines daily but later tells me that she misses them sometimes.  She denies illicit drug use but UDS was noted positive for cocaine back on 12/31/2021.  In the ED, she was afebrile and hypertensive initially up to 210/101.  She was initially given 20 mg of IV hydralazine, IV 20mg  Lasix and ultimately  had to be started on nitroglycerin infusion.  She had no leukocytosis, mild anemia with hemoglobin of 1.3.  Has AKI with creatinine of 1.79 from a prior of 1.47.  BMP is elevated up to 618 compared to 464.  Initial troponin of 50. EKG on my review shows sinus rhythm with occasional PVCs and prolonged QTc of 514.  Review of Systems: As mentioned in the history of present illness. All other systems reviewed and are negative. 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   Chronic back pain    Depression    Diabetes mellitus    GERD (gastroesophageal reflux disease)    Heart murmur    Hypertension    NSTEMI (non-ST elevated myocardial infarction) ([place]) 12/20/2021   Palpitations    Parkinson's disease ([place])    Past Surgical History:  Procedure Laterality Date   CARPAL TUNNEL RELEASE     LEFT HEART CATH AND CORONARY ANGIOGRAPHY N/A 12/20/2021   Procedure: LEFT HEART CATH AND CORONARY ANGIOGRAPHY;  Surgeon: Burnell Blanks, MD;  Location: [facility];  Service: Cardiovascular;  Laterality: N/A;   OPEN REDUCTION INTERNAL FIXATION (ORIF) FINGER WITH RADIAL BONE GRAFT Right 09/12/2015   Procedure: RIGHT MIDDLE FINGER CLOSED REDUCTION AND PINNING;  Surgeon: Iran Planas, MD;  Location: [facility];  Service: Orthopedics;  Laterality: Right;   TONSILLECTOMY     TUBAL LIGATION  Social History:  reports that she has been smoking cigarettes. She has a 0.75 pack-year smoking history. She has never used smokeless tobacco. She reports current alcohol use. She reports that she does not use drugs.  Allergies  Allergen Reactions   Penicillins Shortness Of Breath and Other (See Comments)    Caused yeast infection Has patient had a PCN reaction causing immediate rash, facial/tongue/throat swelling, SOB or  lightheadedness with hypotension: Yes Has patient had a PCN reaction causing severe rash involving mucus membranes or skin necrosis: No Has patient had a PCN reaction that required hospitalization pt was in the hospital at the time of the reaction Has patient had a PCN reaction occurring within the last 10 years: No If all of the above answers are "NO", then may proceed with Cephalosp   Tramadol Other (See Comments)    Made her tongue raw    Family History  Problem Relation Age of Onset   Diabetes Mother    Kidney disease Mother    Seizures Father    Hypertension Father    Stroke Father    Asthma Other     Prior to Admission medications   Medication Sig Start Date End Date Taking? Authorizing Provider  albuterol (PROVENTIL HFA;VENTOLIN HFA) 108 (90 Base) MCG/ACT inhaler Inhale 2 puffs into the lungs every 4 (four) hours as needed for wheezing or shortness of breath.   Yes [provider]  amLODipine (NORVASC) 10 MG tablet Take 1 tablet (10 mg total) by mouth daily. Patient taking differently: Take 10 mg by mouth every morning. 11/26/21  Yes Varney Biles, MD  aspirin EC 81 MG EC tablet Take 1 tablet (81 mg total) by mouth daily. Swallow whole. Patient taking differently: Take 81 mg by mouth at bedtime. Swallow whole. 12/25/21  Yes Dunn, Dayna N, PA-C  atorvastatin (LIPITOR) 80 MG tablet Take 1 tablet (80 mg total) by mouth daily. Patient taking differently: Take 80 mg by mouth every morning. 12/25/21  Yes Dunn, Dayna N, PA-C  baclofen (LIORESAL) 10 MG tablet Take 10 mg by mouth daily as needed for muscle spasms.   Yes [provider]  budesonide-formoterol (SYMBICORT) 160-4.5 MCG/ACT inhaler Inhale 2 puffs into the lungs every morning. 11/09/21  Yes [provider]  cetirizine (ZYRTEC) 10 MG tablet Take 10 mg by mouth every morning.   Yes [provider]  clopidogrel (PLAVIX) 75 MG tablet Take 1 tablet (75 mg total) by mouth daily. Patient taking  differently: Take 75 mg by mouth every morning. 12/25/21  Yes Dunn, Dayna N, PA-C  cyclobenzaprine (FLEXERIL) 10 MG tablet Take 1 tablet (10 mg total) by mouth 3 (three) times daily as needed for muscle spasms. 05/18/19  Yes Domenic Moras, PA-C  ergocalciferol (VITAMIN D2) 1.25 MG (50000 UT) capsule Take 50,000 Units by mouth every Monday.   Yes [provider]  furosemide (LASIX) 40 MG tablet Take 1 tablet (40 mg total) by mouth 2 (two) times daily. Patient taking differently: Take 40 mg by mouth 2 (two) times daily with breakfast and lunch. 01/03/22  Yes Shelly Coss, MD  gabapentin (NEURONTIN) 100 MG capsule Take 100 mg by mouth every morning.   Yes [provider]  glimepiride (AMARYL) 4 MG tablet Take 4 mg by mouth 2 (two) times daily. 01/12/22  Yes [provider]  hydrALAZINE (APRESOLINE) 50 MG tablet Take 1 tablet (50 mg total) by mouth every 8 (eight) hours. 01/03/22  Yes Shelly Coss, MD  hydrOXYzine (ATARAX) 50 MG tablet  Take 50 mg by mouth daily as needed for anxiety or itching.   Yes [provider]  insulin glargine (LANTUS) 100 UNIT/ML Solostar Pen Inject 30 Units into the skin every morning.   Yes [provider]  insulin lispro (HUMALOG) 100 UNIT/ML KwikPen Inject 15 Units into the skin 3 (three) times daily after meals. 01/05/22  Yes [provider]  isosorbide mononitrate (IMDUR) 30 MG 24 hr tablet Take 1 tablet (30 mg total) by mouth daily. Patient taking differently: Take 30 mg by mouth every morning. 12/25/21  Yes Dunn, Dayna N, PA-C  metFORMIN (GLUCOPHAGE) 500 MG tablet Take 500 mg by mouth 2 (two) times daily.   Yes [provider]  Nebivolol HCl 20 MG TABS Take 20 mg by mouth every morning. 12/07/21  Yes [provider]  nitroGLYCERIN (NITROSTAT) 0.4 MG SL tablet Place 1 tablet (0.4 mg total) under the tongue every 5 (five) minutes as needed for chest pain (up to 3 doses). 12/24/21  Yes Dunn, Nedra Hai, PA-C   olmesartan-hydrochlorothiazide (BENICAR HCT) 40-25 MG tablet Take 1 tablet by mouth every morning. 01/12/22  Yes [provider]  pantoprazole (PROTONIX) 40 MG tablet Take 1 tablet (40 mg total) by mouth daily at 12 noon. Patient taking differently: Take 40 mg by mouth every morning. 02/02/14  Yes Regalado, Belkys A, MD  QUEtiapine (SEROQUEL XR) 300 MG 24 hr tablet Take 300 mg by mouth at bedtime. 12/07/21  Yes [provider]  sertraline (ZOLOFT) 50 MG tablet Take 50 mg by mouth every evening.   Yes [provider]  triamcinolone ointment (KENALOG) 0.1 % Apply 1 application. topically 2 (two) times daily.   Yes [provider]  glucose blood (FREESTYLE TEST STRIPS) test strip 1 each by Other route as needed for other. 01/03/22   Shelly Coss, MD  oxyCODONE-acetaminophen (PERCOCET/ROXICET) 5-325 MG tablet Take 1-2 tablets by mouth every 6 (six) hours as needed for severe pain. Patient not taking: Reported on 01/22/2022 08/31/15   Comer Locket, PA-C    Physical Exam: Vitals:   01/22/22 1700 01/22/22 1715 01/22/22 1730 01/22/22 1845  BP: 122/69 125/71 (!) 144/74 103/64  Pulse: 96 97 98 87  Resp: (!) 22 (!) 22 (!) 22 20  Temp:      TempSrc:      SpO2: 98% 97% 98% 97%  Weight:      Height:       Constitutional: NAD, calm, comfortable middle-age female laying at approximately 30 degree incline.  Patient initially tolerated exam but during her leg exam I had barely touched her dorsalis pedis pulse with 2 fingers and she began to be tearful and tremulous with both of her feet stating that they are in severe pain. Eyes: lids and conjunctivae normal ENMT: Mucous membranes are moist.  Neck: normal, supple Respiratory: clear to auscultation bilaterally, no wheezing, no crackles. Normal respiratory effort. No accessory muscle use.  Cardiovascular: Regular rate and rhythm, no murmurs / rubs / gallops.  +1 pitting edema bilateral lower extremity up to mid  pretibial region.. 2+ pedal pulses.  Abdomen: no tenderness,  Bowel sounds positive.  Musculoskeletal: no clubbing / cyanosis. No joint deformity upper and lower extremities. Good ROM, no contractures. Normal muscle tone.  Skin: no rashes, lesions, ulcers. Neurologic: CN 2-12 grossly intact. Sensation intact,  Strength 5/5 in all 4.  Psychiatric: Alert and oriented x 3.  Tearful mood. Data Reviewed:  See HPI  Assessment and Plan: * Hypertensive urgency -questionable adherence  since pt was hypertensive during her last 2 admission and would be controlled at discharged following start of her antihypertensive. Also could be induced by cocaine use with her hx. Check UDS today.  -Continue amlodipine 10 mg -increase hydralazine from 50mg  TID to 100mg  TID -on IV Lasix 40mg  BID  - Hold nebivolol pending UDS given recent hx of cocaine use.  -Hold olmesartan-HCTZ due to AKI - Hold Imdur 30mg  while on nitroglycerin infusion -continue nitroglycerine and titrate to goal of 160/95 in the first 24 hours.  -cardiology is following  Acute on chronic diastolic CHF (congestive heart failure) ([place]) CXR showing vascular congestion and pulmonary edema.  BNP elevated to 618. -On lasix 40mg  BID at home -received IV 80mg  in ED -start IV 40mg  BID here -monitor intake and output, daily weights  Thyroid nodule Seen on CTA chest. Recommends outpatient ultrasound.  Elevated troponin Presented with 50. ED physician discussed with cardiology and they suspect more demand ischemic from HTN. Continue to trend.  Prolonged QT interval QTc of 514 on EKG.  Avoid QT prolongation medication.  Acute kidney injury superimposed on chronic kidney disease ([place]) Has AKI with creatinine of 1.79 from a prior of 1.47.  -hold home olmesartan-HCTZ  -However she is on IV diuretics for CHF exacerbation so we will need to monitor closely  Cocaine abuse ([place]) Check UDS. Denies use but last positive UDS on 12/31/21.   CAD in  native artery S/p NSTEMI 12/20/21 -continue aspirin and plavix  Pure hypercholesterolemia Continue statin  Diabetes mellitus ([place]) Insulin-dependent.A1C of 6.4 on 5/13 -home regimen on 30 Units Lantus and 15 units Humalog TID -start with 20 units in the morning and 10units TID with moderate SSI      Advance Care Planning:   Code Status: Full Code   Consults:none  Family Communication: Discussed with sister at bedside  Severity of Illness: The appropriate patient status for this patient is INPATIENT. Inpatient status is judged to be reasonable and necessary in order to provide the required intensity of service to ensure the patient's safety. The patient's presenting symptoms, physical exam findings, and initial radiographic and laboratory data in the context of their chronic comorbidities is felt to place them at high risk for further clinical deterioration. Furthermore, it is not anticipated that the patient will be medically stable for discharge from the hospital within 2 midnights of admission.   * I certify that at the point of admission it is my clinical judgment that the patient will require inpatient hospital care spanning beyond 2 midnights from the point of admission due to high intensity of service, high risk for further deterioration and high frequency of surveillance required.*  Author: Orene Desanctis, DO 01/22/2022 7:18 PM  For on call review [email or web address].

## 2022-01-22 NOTE — Assessment & Plan Note (Signed)
Presented with 10. ED physician discussed with cardiology and they suspect more demand ischemic from HTN. Continue to trend.

## 2022-01-22 NOTE — ED Provider Notes (Signed)
[facility] EMERGENCY DEPARTMENT Provider Note   CSN: [id number] Arrival date & time: 01/22/22  0751     History {Add pertinent medical, surgical, social history, OB history to HPI:1} Chief Complaint  Patient presents with  . Chest Pain    Carol [name] is a 61 y.o. female.  HPI 61 year old female history of type 2 diabetes, coronary artery disease, hypertension with hypertensive urgency, CKD, recent admission with hypoglycemia, presents today complaining of chest pain and pressure.  He states the pain awoke her from sleep this morning at about 630.  She describes it as pressure and 10 out of 10.  He is unable to tell me if she received any free hospital medications.  She has some associated dyspnea.  She describes recent dyspnea on exertion.  She states that she had too much fluid when she was in the hospital last and has been taking some medications to get rid of this but is unable to give me names or specifics of any medications.  She denies any fever, chills, lateralized leg swelling, productive cough.  She does still continue to smoke but states that this is minimal.  She states she has had chest pain in the past but feels that this is different.    Home Medications Prior to Admission medications   Medication Sig Start Date End Date Taking? Authorizing Provider  albuterol (PROVENTIL HFA;VENTOLIN HFA) 108 (90 Base) MCG/ACT inhaler Inhale 2 puffs into the lungs every 6 (six) hours as needed for wheezing or shortness of breath.    [provider]  ALPRAZolam Duanne Moron) 1 MG tablet Take 1 mg by mouth 2 (two) times daily as needed for anxiety.  09/02/15   [provider]  amLODipine (NORVASC) 10 MG tablet Take 1 tablet (10 mg total) by mouth daily. 11/26/21   Varney Biles, MD  aspirin EC 81 MG EC tablet Take 1 tablet (81 mg total) by mouth daily. Swallow whole. 12/25/21   Dunn, Nedra Hai, PA-C  atorvastatin (LIPITOR) 80 MG tablet Take 1 tablet (80 mg  total) by mouth daily. 12/25/21   Dunn, Nedra Hai, PA-C  budesonide-formoterol (SYMBICORT) 160-4.5 MCG/ACT inhaler Inhale 2 puffs into the lungs in the morning and at bedtime. 11/09/21   [provider]  Buprenorphine HCl-Naloxone HCl 4-1 MG FILM Place 0.5 strips under the tongue daily as needed (narcotic dependance). 08/01/21   [provider]  cetirizine (ZYRTEC) 10 MG tablet Take 10 mg by mouth daily as needed for allergies.    [provider]  clopidogrel (PLAVIX) 75 MG tablet Take 1 tablet (75 mg total) by mouth daily. 12/25/21   Dunn, Nedra Hai, PA-C  cyclobenzaprine (FLEXERIL) 10 MG tablet Take 1 tablet (10 mg total) by mouth 3 (three) times daily as needed for muscle spasms. 05/18/19   Domenic Moras, PA-C  diphenhydramine-acetaminophen (TYLENOL PM) 25-500 MG TABS Take 2 tablets by mouth at bedtime as needed (For sleeping disorder.).     [provider]  ergocalciferol (VITAMIN D2) 1.25 MG (50000 UT) capsule Take 50,000 Units by mouth every Wednesday.    [provider]  furosemide (LASIX) 40 MG tablet Take 1 tablet (40 mg total) by mouth 2 (two) times daily. 01/03/22   Shelly Coss, MD  gabapentin (NEURONTIN) 100 MG capsule Take 100 mg by mouth 3 (three) times daily.    [provider]  glucose blood (FREESTYLE TEST STRIPS) test strip 1 each by Other route as needed for other. 01/03/22   Adhikari,  Amrit, MD  hydrALAZINE (APRESOLINE) 50 MG tablet Take 1 tablet (50 mg total) by mouth every 8 (eight) hours. 01/03/22   Shelly Coss, MD  hydrOXYzine (ATARAX) 50 MG tablet Take 50 mg by mouth 3 (three) times daily as needed for anxiety or itching.    [provider]  insulin aspart (NOVOLOG) 100 UNIT/ML FlexPen Inject 12 Units into the skin 3 (three) times daily with meals.    [provider]  insulin glargine (LANTUS) 100 UNIT/ML injection Inject 60 Units into the skin every evening.    [provider]  isosorbide mononitrate  (IMDUR) 30 MG 24 hr tablet Take 1 tablet (30 mg total) by mouth daily. 12/25/21   Dunn, Nedra Hai, PA-C  Nebivolol HCl 20 MG TABS Take 20 mg by mouth daily. 12/07/21   [provider]  nitroGLYCERIN (NITROSTAT) 0.4 MG SL tablet Place 1 tablet (0.4 mg total) under the tongue every 5 (five) minutes as needed for chest pain (up to 3 doses). 12/24/21   Dunn, Nedra Hai, PA-C  oxyCODONE-acetaminophen (PERCOCET/ROXICET) 5-325 MG tablet Take 1-2 tablets by mouth every 6 (six) hours as needed for severe pain. 08/31/15   Cartner, Marland Kitchen, PA-C  pantoprazole (PROTONIX) 40 MG tablet Take 1 tablet (40 mg total) by mouth daily at 12 noon. Patient taking differently: Take 40 mg by mouth daily as needed (acid reflux). 02/02/14   Regalado, Belkys A, MD  QUEtiapine (SEROQUEL XR) 300 MG 24 hr tablet Take 300 mg by mouth at bedtime. 12/07/21   [provider]  sertraline (ZOLOFT) 50 MG tablet Take 50 mg by mouth daily.    [provider]  triamcinolone ointment (KENALOG) 0.1 % Apply 1 application topically 2 (two) times daily as needed (Apply to feet after showering for dry skin.).     [provider]      Allergies    Penicillins and Tramadol    Review of Systems   Review of Systems  Physical Exam Updated Vital Signs BP (!) 211/98   Pulse 93   Temp (!) 97.4 F (36.3 C) (Oral)   Resp 19   Ht 1.626 m (5\' 4" )   Wt 98.3 kg   SpO2 99%   BMI 37.20 kg/m  Physical Exam Vitals and nursing note reviewed.  Constitutional:      General: She is not in acute distress.    Appearance: She is well-developed. She is obese. She is not ill-appearing.     Comments: Systolic blood pressure 580 At bedside  HENT:     Head: Normocephalic.  Eyes:     Extraocular Movements: Extraocular movements intact.     Pupils: Pupils are equal, round, and reactive to light.  Cardiovascular:     Rate and Rhythm: Normal rate and regular rhythm.     Heart sounds: Normal heart sounds.  Pulmonary:     Effort:  Pulmonary effort is normal.     Breath sounds: Normal breath sounds.  Abdominal:     General: Bowel sounds are normal.     Palpations: Abdomen is soft.  Musculoskeletal:        General: Normal range of motion.     Cervical back: Normal range of motion.     Right lower leg: No tenderness. No edema.     Left lower leg: No tenderness. No edema.  Skin:    General: Skin is warm and dry.     Capillary Refill: Capillary refill takes less than 2 seconds.  Neurological:  General: No focal deficit present.     Mental Status: She is alert.  Psychiatric:        Mood and Affect: Mood normal.    ED Results / Procedures / Treatments   Labs (all labs ordered are listed, but only abnormal results are displayed) Labs Reviewed  CBC  COMPREHENSIVE METABOLIC PANEL  BRAIN NATRIURETIC PEPTIDE  TROPONIN I (HIGH SENSITIVITY)    EKG EKG Interpretation  Date/Time:  Monday January 22 2022 08:00:17 EDT Ventricular Rate:  96 PR Interval:  144 QRS Duration: 93 QT Interval:  406 QTC Calculation: 514 R Axis:   26 Text Interpretation: Sinus rhythm Ventricular premature complex Probable left atrial enlargement Prolonged QT interval Confirmed by Pattricia Boss [phone]) on 01/22/2022 8:37:31 AM  Radiology No results found.  Procedures Procedures  {Document cardiac monitor, telemetry assessment procedure when appropriate:1}  Medications Ordered in ED Medications  nitroGLYCERIN (NITROSTAT) SL tablet 0.4 mg (has no administration in time range)  furosemide (LASIX) injection 80 mg (has no administration in time range)  hydrALAZINE (APRESOLINE) injection 20 mg (has no administration in time range)    ED Course/ Medical Decision Making/ A&P Clinical Course as of 01/22/22 1214  Mon Jan 22, 2022  1017 Chest x-Luverne Zerkle reviewed and interpreted with mild cardiomegaly and mild vascular congestion [DR]  1018 CT of head obtained as patient has contusion around left eye no acute intracranial abnormalities are noted  by myself or radiologist [DR]  1018 CBC reviewed interpreted with normal white count, normal platelets, mild anemia which is improved from first prior [DR]  1018 Brain natriuretic peptide(!) BNP is elevated at 618.4 [DR]    Clinical Course User Index [DR] Pattricia Boss, MD                           Medical Decision Making 61 year old female known history of type 2 diabetes, coronary artery disease, CKD, recent admission for volume overload with CHF presents today with increased dyspnea and chest pain.  Here she is very hypertensive.  Differential diagnosis includes acute coronary syndrome including MI, volume overload, hypertensive urgency/emergency, other intrathoracic/pulmonary etiologies including PE, pneumonia, effusion, pneumothorax, upper GI etiologies, dissection or other diseases of the great vessels EKG without acutely ischemic changes appears unchanged from prior Plan to start with nitro, Lasix, and hydralazine and will reassess for improvement of hypertension and pain Patient with significantly elevated blood pressure systolics 967, elevated troponin at 50, EKG without acutely ischemic changes Elevated troponin question secondary to coronary artery disease versus significant hypertension versus other sources including CHF and CKD. Interventions include IV Lasix and IV nitroglycerin   Amount and/or Complexity of Data Reviewed External Data Reviewed: ECG and notes. Labs: ordered. Decision-making details documented in ED Course. Radiology: ordered. ECG/medicine tests: ordered. Decision-making details documented in ED Course.  Risk Prescription drug management. Decision regarding hospitalization.     {Document critical care time when appropriate:1} {Document review of labs and clinical decision tools ie heart score, Chads2Vasc2 etc:1}  {Document your independent review of radiology images, and any outside records:1} {Document your discussion with family members, caretakers,  and with consultants:1} {Document social determinants of health affecting pt's care:1} {Document your decision making why or why not admission, treatments were needed:1} Final Clinical Impression(s) / ED Diagnoses Final diagnoses:  None    Rx / DC Orders ED Discharge Orders     None

## 2022-01-22 NOTE — Assessment & Plan Note (Signed)
Seen on CTA chest. Recommends outpatient ultrasound.

## 2022-01-22 NOTE — ED Triage Notes (Signed)
Patient was brought in from home by [facility] c/o chest pain. C/o of 9/10 pain and heart palpitations, was given 324mg  of aspirin and 0.4 of nitro  Initial vitals:  210/130  98% on room air  CBG: 123  HR: 102   Post medications BP:  BP: 180/90   Pt was here 3 weeks ago, currently on a regimen of lasix  belly was soft on arrival   couldn't catch her breath on arrival  lug sounds were clear  EMS unsure were [facility]. was coming from  oxygen levels normal

## 2022-01-23 DIAGNOSIS — I2510 Atherosclerotic heart disease of native coronary artery without angina pectoris: Secondary | ICD-10-CM | POA: Diagnosis not present

## 2022-01-23 DIAGNOSIS — I160 Hypertensive urgency: Secondary | ICD-10-CM | POA: Diagnosis not present

## 2022-01-23 DIAGNOSIS — I5033 Acute on chronic diastolic (congestive) heart failure: Secondary | ICD-10-CM | POA: Diagnosis not present

## 2022-01-23 DIAGNOSIS — N179 Acute kidney failure, unspecified: Secondary | ICD-10-CM | POA: Diagnosis not present

## 2022-01-23 LAB — CBC
HCT: 30.4 % — ABNORMAL LOW (ref 36.0–46.0)
Hemoglobin: 10.2 g/dL — ABNORMAL LOW (ref 12.0–15.0)
MCH: 29.6 pg (ref 26.0–34.0)
MCHC: 33.6 g/dL (ref 30.0–36.0)
MCV: 88.1 fL (ref 80.0–100.0)
Platelets: 193 10*3/uL (ref 150–400)
RBC: 3.45 MIL/uL — ABNORMAL LOW (ref 3.87–5.11)
RDW: 13.2 % (ref 11.5–15.5)
WBC: 4 10*3/uL (ref 4.0–10.5)
nRBC: 0 % (ref 0.0–0.2)

## 2022-01-23 LAB — GLUCOSE, CAPILLARY
Glucose-Capillary: 150 mg/dL — ABNORMAL HIGH (ref 70–99)
Glucose-Capillary: 180 mg/dL — ABNORMAL HIGH (ref 70–99)
Glucose-Capillary: 194 mg/dL — ABNORMAL HIGH (ref 70–99)
Glucose-Capillary: 46 mg/dL — ABNORMAL LOW (ref 70–99)
Glucose-Capillary: 115 mg/dL — ABNORMAL HIGH (ref 70–99)

## 2022-01-23 LAB — BASIC METABOLIC PANEL
Anion gap: 9 (ref 5–15)
BUN: 17 mg/dL (ref 6–20)
CO2: 20 mmol/L — ABNORMAL LOW (ref 22–32)
Calcium: 8.5 mg/dL — ABNORMAL LOW (ref 8.9–10.3)
Chloride: 111 mmol/L (ref 98–111)
Creatinine, Ser: 2.48 mg/dL — ABNORMAL HIGH (ref 0.44–1.00)
GFR, Estimated: 22 mL/min — ABNORMAL LOW (ref >60)
Glucose, Bld: 156 mg/dL — ABNORMAL HIGH (ref 70–99)
Potassium: 3.7 mmol/L (ref 3.5–5.1)
Sodium: 140 mmol/L (ref 135–145)

## 2022-01-23 LAB — RAPID URINE DRUG SCREEN, HOSP PERFORMED
Amphetamines: NOT DETECTED
Barbiturates: NOT DETECTED
Benzodiazepines: NOT DETECTED
Cocaine: POSITIVE — AB
Opiates: NOT DETECTED
Tetrahydrocannabinol: NOT DETECTED

## 2022-01-23 MED ORDER — OXYCODONE-ACETAMINOPHEN 5-325 MG PO TABS
1.0000 | ORAL_TABLET | Freq: Four times a day (QID) | ORAL | Status: DC | PRN
  Administered 2022-01-23 – 2022-02-04 (×29): 2 via ORAL
  Filled 2022-01-23 (×30): qty 2

## 2022-01-23 MED ORDER — HYDRALAZINE HCL 50 MG PO TABS
50.0000 mg | ORAL_TABLET | Freq: Three times a day (TID) | ORAL | Status: DC
  Administered 2022-01-23 – 2022-01-24 (×2): 50 mg via ORAL
  Filled 2022-01-23 (×2): qty 1

## 2022-01-23 NOTE — Evaluation (Signed)
Physical Therapy Evaluation Patient Details Name: Carol [name] MRN: [id number] DOB: 26-Apr-1961 Today's Date: 01/23/2022  History of Present Illness  Patient is a 61 y/o female who presents on 01/22/22 with chest pain, SOB (in setting of cocaine withdrawal) and heart palpitations. Admitted with HTN emergency and acute on chronic CHF. CXR-pulmonary edema. Recently admitted 5/13-5/17 for hypoglycemia, elevated BP and 5/3-5/7 for NSTEMI and had LHC of 5/3 showing 100% stenosis of a small sub-branch of the right PDA. PMH includes anxiety, chronic back pain, depression, DM, HTN, PD, NSTEMI, cocaine use.  Clinical Impression  Patient presents with generalized weakness, impaired balance, decreased activity tolerance, dyspnea on exertion and impaired mobility s/p above. Pt lives home alone and reports being Mod I for ADls and uses RW for ambulation. Reports difficulty with completing ADLs due to SOB. Today, pt requires Min guard-supervision for transfers and gait training with use of RW; 1 standing rest break due to 2/4 DOE and fatigue. Reports pain in Bil feet, head and back. Plans to go stay with daughter at d/c. Recommend HHPT and HH aide to assist with self care and IADLs as well as improving overall strength, mobility and endurance. Will follow acutely.   BP pre activity 112/66, post activity BP 107/76.     Recommendations for follow up therapy are one component of a multi-disciplinary discharge planning process, led by the attending physician.  Recommendations may be updated based on patient status, additional functional criteria and insurance authorization.  Follow Up Recommendations Other (comment) ([place] aide)    Assistance Recommended at Discharge Intermittent Supervision/Assistance  Patient can return home with the following  A little help with bathing/dressing/bathroom;A little help with walking and/or transfers;Assistance with cooking/housework;Help with stairs or ramp for entrance     Equipment Recommendations None recommended by PT  Recommendations for Other Services       Functional Status Assessment Patient has had a recent decline in their functional status and demonstrates the ability to make significant improvements in function in a reasonable and predictable amount of time.     Precautions / Restrictions Precautions Precautions: Fall;Other (comment) Precaution Comments: watch BP Restrictions Weight Bearing Restrictions: No      Mobility  Bed Mobility Overal bed mobility: Needs Assistance Bed Mobility: Rolling, Sidelying to Sit, Sit to Supine Rolling: Supervision Sidelying to sit: Supervision   Sit to supine: Supervision   General bed mobility comments: supervision for safety.    Transfers Overall transfer level: Needs assistance Equipment used: Rolling walker (2 wheels) Transfers: Sit to/from Stand Sit to Stand: Supervision           General transfer comment: from EOB x2. supervision for safety, required cues for hand placement on bed and RW.    Ambulation/Gait Ambulation/Gait assistance: Min guard Gait Distance (Feet): 50 Feet Assistive device: Rolling walker (2 wheels) Gait Pattern/deviations: Step-to pattern, Decreased stride length, Trunk flexed, Narrow base of support, Step-through pattern Gait velocity: decreased Gait velocity interpretation: <1.31 ft/sec, indicative of household ambulator   General Gait Details: pt with slow and minimally unsteady gait with decreased stride length. required 1 standing rest break but able to maintain SpO2 >90%. 2/4 DOE scale. reported continued headache but no dizziness.  Stairs            Wheelchair Mobility    Modified Rankin (Stroke Patients Only)       Balance Overall balance assessment: Needs assistance, History of Falls Sitting-balance support: Feet supported, No upper extremity supported Sitting balance-Leahy Scale: Good Sitting balance -  Comments: Able to donn socks without  difficulty, no LOB   Standing balance support: During functional activity Standing balance-Leahy Scale: Poor Standing balance comment: Requires UE support in standing                             Pertinent Vitals/Pain Pain Assessment Pain Assessment: Faces Faces Pain Scale: Hurts little more Pain Location: back, bil feet Pain Descriptors / Indicators: Sore, Discomfort Pain Intervention(s): Monitored during session, Repositioned    Home Living Family/patient expects to be discharged to:: Private residence Living Arrangements: Alone Available Help at Discharge: Family;Available PRN/intermittently (daughter and son, sister) Type of Home: House Home Access: Level entry       Home Layout: One level Home Equipment: Conservation officer, nature (2 wheels);Cane - single point Additional Comments: Plans to stay with daughter    Prior Function Prior Level of Function : Independent/Modified Independent             Mobility Comments: uses RW for ambulation, cooks/clean, drives ADLs Comments: independent however reports difficulty with ADL tasks due to SOB.     Hand Dominance        Extremity/Trunk Assessment   Upper Extremity Assessment Upper Extremity Assessment: Defer to OT evaluation    Lower Extremity Assessment Lower Extremity Assessment: RLE deficits/detail;LLE deficits/detail RLE Deficits / Details: burning, tingling and numbness distal to knees RLE Sensation: history of peripheral neuropathy LLE Deficits / Details: burning, tingling and numbness distal to knees LLE Sensation: history of peripheral neuropathy       Communication   Communication: No difficulties  Cognition Arousal/Alertness: Awake/alert Behavior During Therapy: WFL for tasks assessed/performed Overall Cognitive Status: Within Functional Limits for tasks assessed                                 General Comments: for basic mobility tasks        General Comments General comments  (skin integrity, edema, etc.): BP pre activity 112/66, post activity BP 107/76.    Exercises     Assessment/Plan    PT Assessment Patient needs continued PT services  PT Problem List Decreased strength;Decreased mobility;Decreased balance;Cardiopulmonary status limiting activity;Decreased activity tolerance       PT Treatment Interventions Therapeutic activities;Gait training;Stair training;Balance training;Therapeutic exercise;Patient/family education    PT Goals (Current goals can be found in the Care Plan section)  Acute Rehab PT Goals Patient Stated Goal: feel better and go home PT Goal Formulation: With patient Time For Goal Achievement: 02/06/22 Potential to Achieve Goals: Good    Frequency Min 3X/week     Co-evaluation               AM-PAC PT "6 Clicks" Mobility  Outcome Measure Help needed turning from your back to your side while in a flat bed without using bedrails?: A Little Help needed moving from lying on your back to sitting on the side of a flat bed without using bedrails?: A Little Help needed moving to and from a bed to a chair (including a wheelchair)?: A Little Help needed standing up from a chair using your arms (e.g., wheelchair or bedside chair)?: A Little Help needed to walk in hospital room?: A Little Help needed climbing 3-5 steps with a railing? : A Little 6 Click Score: 18    End of Session Equipment Utilized During Treatment: Gait belt Activity Tolerance: Patient tolerated treatment well Patient  left: in bed;with call bell/phone within reach;with bed alarm set Nurse Communication: Mobility status;Other (comment) ([place]) PT Visit Diagnosis: Muscle weakness (generalized) (M62.81);Unsteadiness on feet (R26.81);Other (comment) (DOE)    Time: 4446-1901 PT Time Calculation (min) (ACUTE ONLY): 23 min   Charges:   PT Evaluation $PT Eval Moderate Complexity: 1 Mod PT Treatments $Gait Training: 8-22 mins        Marisa Severin, PT,  DPT Acute Rehabilitation Services Secure chat preferred Office [place] 01/23/2022, 4:24 PM

## 2022-01-23 NOTE — Progress Notes (Signed)
Progress Note  Patient: Carol [name] VQQ:[id number] DOB: 1961/06/05  DOA: 01/22/2022  DOS: 01/23/2022    Brief hospital course: Per HPI: Carol [name] is a 61 y.o. female with medical history significant of uncontrolled HTN, insulin-dependent Type 2DM, CKD 3b, CAD s/p recent NSTEMI, and cocaine use who presents with chest pain and increasing shortness of breath.   Patient had sister at bedside.  She lives alone and reports that she woke up this morning with severe constant left-sided tight chest pain beneath her breast.  Also had shortness of breath waking her up out of her sleep.  Has had gradual increasing lower extremity edema since her last admission several weeks ago.   She was recently admitted 5/13-5/17 for hypoglycemia after she continued to take her insulin regimen despite being hypoglycemia earlier. Later developed acute hypoxic respiratory failure from fluid overload from receiving IV dextrose.  She was also noted to be hypertensive to SBP of 200 and had increase to hydralazine and addition of Lasix.  Prior to this she was admitted from 5/3-5/7 for NSTEMI and had LHC of 5/3 showing 100% stenosis of a small sub-branch of the right PDA. Medical managment advised by cardiology and started on Plavix in addition to home aspirin, beta-blocker and statin. She was noted to be hypertensive at that time as well and discharged home on amlodipine, hydralazine TID, Imdur and nebivolol.    Patient reports that she has been taking her blood pressure medicines daily but later tells me that she misses them sometimes.  She denies illicit drug use but UDS was noted positive for cocaine back on 12/31/2021.   In the ED, she was afebrile and hypertensive initially up to 210/101.  She was initially given 20 mg of IV hydralazine, IV 20mg  Lasix and ultimately had to be started on nitroglycerin infusion.   She had no leukocytosis, mild anemia with hemoglobin of 1.3.  Has AKI with creatinine of 1.79  from a prior of 1.47.  BMP is elevated up to 618 compared to 464.  Initial troponin of 50. EKG on my review shows sinus rhythm with occasional PVCs and prolonged QTc of 514.  Hospital Course: BP control improved and patient's symptoms improved with diuresis. Creatinine bumped, and will be monitored with plan to hold diuretic, encourage po intake, and DC with home health therapies on 6/7 if stable.  Assessment and Plan: * Hypertensive urgency: Improved - Continue amlodipine 10 mg - Increased hydralazine, will return to home dose with significant BP improvement.  - Hold nebivolol given +cocaine. Suggest transition to coreg at discharge. - Restart imdur, stop NTG gtt. Need to avoid overly rapid correction in setting of AKI. Hold ARB/thiazide.  - Appreciate cardiology following, follow up.   Acute on chronic diastolic CHF (congestive heart failure) (HCC) CXR showing vascular congestion and pulmonary edema.  BNP elevated to 618. -On lasix 40mg  BID at home -received IV 80mg  then 40mg  IV with rising Cr, will hold further diuresis for today.  -monitor intake and output, daily weights, none charted this AM, wt stable.  Thyroid nodule: Seen on CTA - Needs U/S as outpatient, consideration of further FNA based on characteristics. Check TSH, T4.   Elevated troponin: Demand myocardial ischemia due to cocaine intoxication/HTN urgency. No chest pain, recent LHC with CAD to be managed medically. Not consistent with ACS, plan outpatient follow up and risk factor modification.  S/p NSTEMI 12/20/21 -continue aspirin and plavix, statin, should transition to nonselective BB if planning on continuing.  HLD:  - Continue statin  Prolonged QT interval QTc of 514 on EKG.  Avoid QT prolongation medication.  Acute kidney injury superimposed on chronic kidney disease ([place]) Has AKI with creatinine of 1.79 from a prior of 1.47.  -hold home olmesartan-HCTZ  -However she is on IV diuretics for CHF exacerbation so we  will need to monitor closely  Cocaine abuse [facility]): +UDS again here.   Diabetes mellitus ([place]) Insulin-dependent.A1C of 6.4 on 5/13 -home regimen on 30 Units Lantus and 15 units Humalog TID -start with 20 units in the morning and 10units TID with moderate SSI  Obesity: Estimated body mass index is 33.26 kg/m as calculated from the following:   Height as of this encounter: 5\' 4"  (1.626 m).   Weight as of this encounter: 87.9 kg.  Subjective: Back pain that is chronic is acting up overnight, also pain in many other sites that is chronic. No chest pain, no dyspnea. Requesting home oxycodone.  Objective: Vitals:   01/23/22 1100 01/23/22 1300 01/23/22 1700 01/23/22 1742  BP: 125/73 113/63 117/63   Pulse: 88 75 80   Resp: 16 19 16 20   Temp:  98.5 F (36.9 C) 98.4 F (36.9 C)   TempSrc:  Oral Oral   SpO2: 95% 94% 96%   Weight:      Height:       Gen: 61 y.o. female in no distress Pulm: Nonlabored breathing room air. Clear CV: Regular rate and rhythm. No murmur, rub, or gallop. No JVD, no pitting dependent edema. GI: Abdomen soft, non-tender, non-distended, with normoactive bowel sounds.  Ext: Warm, no deformities Skin: No rashes, lesions or ulcers on visualized skin. Neuro: Alert and oriented. N focal neurological deficits. Psych: Judgement and insight appear fair. Mood euthymic & affect congruent. Behavior is appropriate.    Data Personally reviewed:  CBC: Recent Labs  Lab 01/22/22 0845 01/23/22 0537  WBC 6.3 4.0  HGB 11.3* 10.2*  HCT 34.4* 30.4*  MCV 89.8 88.1  PLT 214 329   Basic Metabolic Panel: Recent Labs  Lab 01/22/22 0845 01/23/22 0537  NA 140 140  K 3.6 3.7  CL 110 111  CO2 16* 20*  GLUCOSE 132* 156*  BUN 10 17  CREATININE 1.79* 2.48*  CALCIUM 8.9 8.5*   GFR: Estimated Creatinine Clearance: 26.2 mL/min (A) (by C-G formula based on SCr of 2.48 mg/dL (H)). Liver Function Tests: Recent Labs  Lab 01/22/22 0845  AST 31  ALT 13  ALKPHOS 123   BILITOT 0.4  PROT 6.8  ALBUMIN 2.7*   No results for input(s): LIPASE, AMYLASE in the last 168 hours. No results for input(s): AMMONIA in the last 168 hours. Coagulation Profile: No results for input(s): INR, PROTIME in the last 168 hours. Cardiac Enzymes: No results for input(s): CKTOTAL, CKMB, CKMBINDEX, TROPONINI in the last 168 hours. BNP (last 3 results) No results for input(s): PROBNP in the last 8760 hours. HbA1C: No results for input(s): HGBA1C in the last 72 hours. CBG: Recent Labs  Lab 01/22/22 1912 01/22/22 2049 01/23/22 0541 01/23/22 1215 01/23/22 1721  GLUCAP 74 79 150* 180* 194*   Lipid Profile: No results for input(s): CHOL, HDL, LDLCALC, TRIG, CHOLHDL, LDLDIRECT in the last 72 hours. Thyroid Function Tests: No results for input(s): TSH, T4TOTAL, FREET4, T3FREE, THYROIDAB in the last 72 hours. Anemia Panel: No results for input(s): VITAMINB12, FOLATE, FERRITIN, TIBC, IRON, RETICCTPCT in the last 72 hours. Urine analysis:    Component Value Date/Time   COLORURINE YELLOW 03/21/2017  [place] (A) 03/21/2017 1435   LABSPEC 1.010 03/21/2017 1435   PHURINE 5.0 03/21/2017 1435   GLUCOSEU 150 (A) 03/21/2017 1435   HGBUR SMALL (A) 03/21/2017 1435   BILIRUBINUR NEGATIVE 03/21/2017 1435   KETONESUR NEGATIVE 03/21/2017 1435   PROTEINUR NEGATIVE 03/21/2017 1435   UROBILINOGEN 0.2 01/29/2014 2116   NITRITE POSITIVE (A) 03/21/2017 1435   LEUKOCYTESUR MODERATE (A) 03/21/2017 1435   No results found for this or any previous visit (from the past 240 hour(s)).   DG Chest 1 View  Result Date: 01/22/2022 CLINICAL DATA:  Headache, chest pain, shortness of breath EXAM: CHEST  1 VIEW COMPARISON:  01/10/2022 FINDINGS: Similar pulmonary vascular congestion and mild interstitial prominence. No pleural effusion. No pneumothorax. Stable mild cardiomegaly. IMPRESSION: Mild cardiomegaly. Pulmonary vascular congestion and mild interstitial prominence that may reflect  edema. Electronically Signed   By: Macy Mis M.D.   On: 01/22/2022 09:42   CT Head Wo Contrast  Result Date: 01/22/2022 CLINICAL DATA:  Head trauma, abnormal mental status (Age 53-64y) EXAM: CT HEAD WITHOUT CONTRAST TECHNIQUE: Contiguous axial images were obtained from the base of the skull through the vertex without intravenous contrast. RADIATION DOSE REDUCTION: This exam was performed according to the departmental dose-optimization program which includes automated exposure control, adjustment of the mA and/or kV according to patient size and/or use of iterative reconstruction technique. COMPARISON:  12/30/2021 FINDINGS: Brain: There is no acute intracranial hemorrhage, mass effect, or edema. No new loss of gray-white differentiation. 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 Ventricles and sulci are stable in size and configuration. No extra-axial collection. Vascular: There is atherosclerotic calcification at the skull base. Skull: Calvarium is unremarkable. Sinuses/Orbits: No acute finding. Other: None. IMPRESSION: No acute intracranial abnormality. Stable chronic/nonemergent findings detailed above. Electronically Signed   By: Macy Mis M.D.   On: 01/22/2022 09:36   CT Angio Chest Aorta W and/or Wo Contrast  Result Date: 01/22/2022 CLINICAL DATA:  Chest pain and shortness of breath. Possible acute aortic syndrome. EXAM: CT ANGIOGRAPHY CHEST WITH CONTRAST TECHNIQUE: Multidetector CT imaging of the chest was performed using the standard protocol during bolus administration of intravenous contrast. Multiplanar CT image reconstructions and MIPs were obtained to evaluate the vascular anatomy. RADIATION DOSE REDUCTION: This exam was performed according to the departmental dose-optimization program which includes automated exposure control, adjustment of the mA and/or kV according to patient size and/or use of iterative reconstruction technique.  CONTRAST:  76mL OMNIPAQUE IOHEXOL 350 MG/ML SOLN COMPARISON:  Chest radiograph 01/22/2022 FINDINGS: Cardiovascular: Contrast medium phase was timed for systemic arterial opacification. Contrast in the pulmonary arterial tree is somewhat dilute, and today's exam has a low sensitivity in assessing for pulmonary embolus. Atherosclerotic calcification of the aortic arch and descending thoracic aorta noted. Thickening of the interventricular septum at 2.0 cm suggesting left ventricular hypertrophy. No aortic dissection or acute thoracic aortic findings. Mild cardiomegaly. Mediastinum/Nodes: Solid 2.7 cm left thyroid nodule. My review of the imaging record does not indicate that this has been previously assessed sub-centimeter nodules in the isthmus and right thyroid lobe. Scattered mostly small mediastinal lymph nodes. Right paratracheal node anterior to the carina is borderline prominent at 1.0 cm in diameter on image 32 series 5. Lungs/Pleura: Mild reticular subpleural interstitial accentuation in the lungs. Upper Abdomen: Possible hepatic steatosis. 1.2 cm gallstone in the gallbladder on image 96 series 5. Musculoskeletal: Unremarkable Review of the MIP images confirms the above findings. IMPRESSION:  1. No acute aortic findings. 2. Mild cardiomegaly and left ventricular hypertrophy. 3. Solid 2.7 cm left thyroid nodule. Recommend thyroid US (ref: J Am Coll Radiol. 2015 Feb;12(2): 143-50). 4. Aortic Atherosclerosis (ICD10-I70.0). 5. Possible hepatic steatosis. 6. Cholelithiasis. 7. Mild subpleural reticular interstitial accentuation in the lungs, nonspecific. No substantial advancement compared to the appearance of the lung bases on 01/15/2020. Electronically Signed   By: Van Clines M.D.   On: 01/22/2022 15:20    Family Communication: None at bedside  Disposition: Status is: Inpatient Remains inpatient appropriate because: Monitoring of AKI with management of HTN urgency. Planned Discharge Destination:  Home      Patrecia Pour, MD 01/23/2022 6:12 PM Page by [email or web address]

## 2022-01-23 NOTE — Progress Notes (Signed)
Progress Note  Patient Name: Essa Wenk Date of Encounter: 01/23/2022  [facility] Cardiologist: Janina Mayo, MD   Subjective   Mrs. Narayan has chronic back pain but otherwise feels well.   Inpatient Medications    Scheduled Meds:  amLODipine  10 mg Oral q morning   aspirin EC  81 mg Oral Daily   atorvastatin  80 mg Oral q morning   clopidogrel  75 mg Oral q morning   enoxaparin (LOVENOX) injection  40 mg Subcutaneous Q24H   gabapentin  100 mg Oral q morning   hydrALAZINE  100 mg Oral Q8H   insulin aspart  0-15 Units Subcutaneous TID WC   insulin aspart  10 Units Subcutaneous TID WC   insulin glargine-yfgn  20 Units Subcutaneous Daily   loratadine  10 mg Oral Daily   mometasone-formoterol  2 puff Inhalation BID   pantoprazole  40 mg Oral q morning   QUEtiapine  300 mg Oral QHS   sertraline  50 mg Oral QPM   Vitamin D (Ergocalciferol)  50,000 Units Oral Q Mon   Continuous Infusions:  nitroGLYCERIN Stopped (01/22/22 1558)   PRN Meds: albuterol, cyclobenzaprine, hydrOXYzine, LORazepam, oxyCODONE-acetaminophen   Vital Signs    Vitals:   01/23/22 0429 01/23/22 0800 01/23/22 0841 01/23/22 0958  BP: 138/73 138/76 138/76   Pulse: 79 80 85 95  Resp: 16 16 (!) 24 18  Temp: 98.7 F (37.1 C) 98.7 F (37.1 C)    TempSrc: Axillary Oral    SpO2: 95% 95% 97% 97%  Weight: 87.9 kg     Height: 5\' 4"  (1.626 m)       Intake/Output Summary (Last 24 hours) at 01/23/2022 1033 Last data filed at 01/22/2022 2240 Gross per 24 hour  Intake 300 ml  Output --  Net 300 ml      01/23/2022    4:29 AM 01/22/2022    8:05 PM 01/22/2022    8:09 AM  Last 3 Weights  Weight (lbs) 193 lb 12.6 oz 192 lb 10.9 oz 216 lb 11.4 oz  Weight (kg) 87.9 kg 87.4 kg 98.3 kg      Telemetry    NSR - Personally Reviewed  ECG    NSR , PVC - Personally Reviewed  Physical Exam   Vitals:   01/23/22 0841 01/23/22 0958  BP: 138/76   Pulse: 85 95  Resp: (!) 24 18  Temp:    SpO2:  97% 97%    GEN: No acute distress.   Cardiac: RRR, no murmurs, rubs, or gallops.  Respiratory: Clear to auscultation bilaterally. GI: Soft, nontender, non-distended  MS: No edema; No deformity. Neuro:  Nonfocal  Psych: Normal affect   Labs    High Sensitivity Troponin:   Recent Labs  Lab 01/10/22 0946 01/10/22 1226 01/22/22 0845 01/22/22 1348 01/22/22 1724  TROPONINIHS 39* 31* 50* 53* 40*     Chemistry Recent Labs  Lab 01/22/22 0845 01/23/22 0537  NA 140 140  K 3.6 3.7  CL 110 111  CO2 16* 20*  GLUCOSE 132* 156*  BUN 10 17  CREATININE 1.79* 2.48*  CALCIUM 8.9 8.5*  PROT 6.8  --   ALBUMIN 2.7*  --   AST 31  --   ALT 13  --   ALKPHOS 123  --   BILITOT 0.4  --   GFRNONAA 32* 22*  ANIONGAP 14 9    Lipids No results for input(s): CHOL, TRIG, HDL, LABVLDL, LDLCALC, CHOLHDL in the  last 168 hours.  Hematology Recent Labs  Lab 01/22/22 0845 01/23/22 0537  WBC 6.3 4.0  RBC 3.83* 3.45*  HGB 11.3* 10.2*  HCT 34.4* 30.4*  MCV 89.8 88.1  MCH 29.5 29.6  MCHC 32.8 33.6  RDW 13.1 13.2  PLT 214 193   Thyroid No results for input(s): TSH, FREET4 in the last 168 hours.  BNP Recent Labs  Lab 01/22/22 0845  BNP 618.4*    DDimer No results for input(s): DDIMER in the last 168 hours.   Radiology    DG Chest 1 View  Result Date: 01/22/2022 CLINICAL DATA:  Headache, chest pain, shortness of breath EXAM: CHEST  1 VIEW COMPARISON:  01/10/2022 FINDINGS: Similar pulmonary vascular congestion and mild interstitial prominence. No pleural effusion. No pneumothorax. Stable mild cardiomegaly. IMPRESSION: Mild cardiomegaly. Pulmonary vascular congestion and mild interstitial prominence that may reflect edema. Electronically Signed   By: Macy Mis M.D.   On: 01/22/2022 09:42   CT Head Wo Contrast  Result Date: 01/22/2022 CLINICAL DATA:  Head trauma, abnormal mental status (Age 71-64y) EXAM: CT HEAD WITHOUT CONTRAST TECHNIQUE: Contiguous axial images were obtained from the  base of the skull through the vertex without intravenous contrast. RADIATION DOSE REDUCTION: This exam was performed according to the departmental dose-optimization program which includes automated exposure control, adjustment of the mA and/or kV according to patient size and/or use of iterative reconstruction technique. COMPARISON:  12/30/2021 FINDINGS: Brain: There is no acute intracranial hemorrhage, mass effect, or edema. No new loss of gray-white differentiation. 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 Ventricles and sulci are stable in size and configuration. No extra-axial collection. Vascular: There is atherosclerotic calcification at the skull base. Skull: Calvarium is unremarkable. Sinuses/Orbits: No acute finding. Other: None. IMPRESSION: No acute intracranial abnormality. Stable chronic/nonemergent findings detailed above. Electronically Signed   By: Macy Mis M.D.   On: 01/22/2022 09:36   CT Angio Chest Aorta W and/or Wo Contrast  Result Date: 01/22/2022 CLINICAL DATA:  Chest pain and shortness of breath. Possible acute aortic syndrome. EXAM: CT ANGIOGRAPHY CHEST WITH CONTRAST TECHNIQUE: Multidetector CT imaging of the chest was performed using the standard protocol during bolus administration of intravenous contrast. Multiplanar CT image reconstructions and MIPs were obtained to evaluate the vascular anatomy. RADIATION DOSE REDUCTION: This exam was performed according to the departmental dose-optimization program which includes automated exposure control, adjustment of the mA and/or kV according to patient size and/or use of iterative reconstruction technique. CONTRAST:  11mL OMNIPAQUE IOHEXOL 350 MG/ML SOLN COMPARISON:  Chest radiograph 01/22/2022 FINDINGS: Cardiovascular: Contrast medium phase was timed for systemic arterial opacification. Contrast in the pulmonary arterial tree is somewhat dilute, and today's exam has a low sensitivity  in assessing for pulmonary embolus. Atherosclerotic calcification of the aortic arch and descending thoracic aorta noted. Thickening of the interventricular septum at 2.0 cm suggesting left ventricular hypertrophy. No aortic dissection or acute thoracic aortic findings. Mild cardiomegaly. Mediastinum/Nodes: Solid 2.7 cm left thyroid nodule. My review of the imaging record does not indicate that this has been previously assessed sub-centimeter nodules in the isthmus and right thyroid lobe. Scattered mostly small mediastinal lymph nodes. Right paratracheal node anterior to the carina is borderline prominent at 1.0 cm in diameter on image 32 series 5. Lungs/Pleura: Mild reticular subpleural interstitial accentuation in the lungs. Upper Abdomen: Possible hepatic steatosis. 1.2 cm gallstone in the gallbladder on image 96 series 5. Musculoskeletal: Unremarkable Review of the MIP images confirms  the above findings. IMPRESSION: 1. No acute aortic findings. 2. Mild cardiomegaly and left ventricular hypertrophy. 3. Solid 2.7 cm left thyroid nodule. Recommend thyroid US (ref: J Am Coll Radiol. 2015 Feb;12(2): 143-50). 4. Aortic Atherosclerosis (ICD10-I70.0). 5. Possible hepatic steatosis. 6. Cholelithiasis. 7. Mild subpleural reticular interstitial accentuation in the lungs, nonspecific. No substantial advancement compared to the appearance of the lung bases on 01/15/2020. Electronically Signed   By: Van Clines M.D.   On: 01/22/2022 15:20    Cardiac Studies   TTE 12/20/2021  1. Left ventricular ejection fraction, by estimation, is 60 to 65%. The  left ventricle has normal function. The left ventricle has no regional  wall motion abnormalities. There is mild left ventricular hypertrophy.  Left ventricular diastolic parameters  are consistent with Grade I diastolic dysfunction (impaired relaxation).   2. Right ventricular systolic function is normal. The right ventricular  size is normal.   3. Left atrial size  was mildly dilated.   4. The mitral valve is normal in structure. Trivial mitral valve  regurgitation. No evidence of mitral stenosis.   5. The aortic valve is normal in structure. Aortic valve regurgitation is  not visualized. No aortic stenosis is present.   6. The inferior vena cava is normal in size with greater than 50%  respiratory variability, suggesting right atrial pressure of 3 mmHg.  Patient Profile     Yenny Kosa is a 61 y.o. female with a hx of hypertension, HLD, type 2 DM, remote cocaine use planning to live with family and work on recovery, She had LHC 5/3 that showed sub Trulee Hamstra off of the right PDA. Medical management was recommended, who is being seen initially on 01/22/2022 for the evaluation of chest pain and SOB at the request of Dr. Jeanell Sparrow.  Assessment & Plan     SOB: in the setting of cocaine withdrawal. Bnp was elevated s/p diuresis. Her crt increased dramatically 1.7->2.4. Plan to hold diuretics. She is euvolemic today and feels well. Can encourage PO fluid intake today. If her renal function improves, can plan to hold her diuretics until she is seen in follow up. We can arrange this. Otherwise, can continue current antihypertensive regimen. Can hold home ARB with AKI on discharge.   For questions or updates, please contact [place] Please consult [email or web address] for contact info under        Signed, Janina Mayo, MD  01/23/2022, 10:33 AM

## 2022-01-24 ENCOUNTER — Inpatient Hospital Stay (HOSPITAL_COMMUNITY): Payer: Medicare Other

## 2022-01-24 DIAGNOSIS — I160 Hypertensive urgency: Secondary | ICD-10-CM | POA: Diagnosis not present

## 2022-01-24 DIAGNOSIS — I5033 Acute on chronic diastolic (congestive) heart failure: Secondary | ICD-10-CM | POA: Diagnosis not present

## 2022-01-24 DIAGNOSIS — I2510 Atherosclerotic heart disease of native coronary artery without angina pectoris: Secondary | ICD-10-CM | POA: Diagnosis not present

## 2022-01-24 DIAGNOSIS — E041 Nontoxic single thyroid nodule: Secondary | ICD-10-CM

## 2022-01-24 DIAGNOSIS — N179 Acute kidney failure, unspecified: Secondary | ICD-10-CM | POA: Diagnosis not present

## 2022-01-24 LAB — BASIC METABOLIC PANEL
Anion gap: 11 (ref 5–15)
BUN: 27 mg/dL — ABNORMAL HIGH (ref 6–20)
CO2: 20 mmol/L — ABNORMAL LOW (ref 22–32)
Calcium: 8 mg/dL — ABNORMAL LOW (ref 8.9–10.3)
Chloride: 102 mmol/L (ref 98–111)
Creatinine, Ser: 4 mg/dL — ABNORMAL HIGH (ref 0.44–1.00)
GFR, Estimated: 12 mL/min — ABNORMAL LOW (ref >60)
Glucose, Bld: 119 mg/dL — ABNORMAL HIGH (ref 70–99)
Potassium: 3.6 mmol/L (ref 3.5–5.1)
Sodium: 133 mmol/L — ABNORMAL LOW (ref 135–145)

## 2022-01-24 LAB — URINALYSIS, COMPLETE (UACMP) WITH MICROSCOPIC
Bilirubin Urine: NEGATIVE
Glucose, UA: 50 mg/dL — AB
Hgb urine dipstick: NEGATIVE
Ketones, ur: NEGATIVE mg/dL
Leukocytes,Ua: NEGATIVE
Nitrite: NEGATIVE
Protein, ur: >=300 mg/dL — AB
Specific Gravity, Urine: 1.024 (ref 1.005–1.030)
pH: 5 (ref 5.0–8.0)

## 2022-01-24 LAB — CBC
HCT: 27.9 % — ABNORMAL LOW (ref 36.0–46.0)
Hemoglobin: 9.4 g/dL — ABNORMAL LOW (ref 12.0–15.0)
MCH: 30 pg (ref 26.0–34.0)
MCHC: 33.7 g/dL (ref 30.0–36.0)
MCV: 89.1 fL (ref 80.0–100.0)
Platelets: 156 10*3/uL (ref 150–400)
RBC: 3.13 MIL/uL — ABNORMAL LOW (ref 3.87–5.11)
RDW: 13.5 % (ref 11.5–15.5)
WBC: 4.5 10*3/uL (ref 4.0–10.5)
nRBC: 0 % (ref 0.0–0.2)

## 2022-01-24 LAB — GLUCOSE, CAPILLARY
Glucose-Capillary: 176 mg/dL — ABNORMAL HIGH (ref 70–99)
Glucose-Capillary: 227 mg/dL — ABNORMAL HIGH (ref 70–99)
Glucose-Capillary: 181 mg/dL — ABNORMAL HIGH (ref 70–99)
Glucose-Capillary: 149 mg/dL — ABNORMAL HIGH (ref 70–99)

## 2022-01-24 LAB — PROTEIN / CREATININE RATIO, URINE
Creatinine, Urine: 136.67 mg/dL
Protein Creatinine Ratio: 6.06 mg/mg{Cre} — ABNORMAL HIGH (ref 0.00–0.15)
Total Protein, Urine: 828 mg/dL

## 2022-01-24 LAB — T4, FREE: Free T4: 0.61 ng/dL (ref 0.61–1.12)

## 2022-01-24 LAB — TSH: TSH: 1.374 u[IU]/mL (ref 0.350–4.500)

## 2022-01-24 LAB — SODIUM, URINE, RANDOM: Sodium, Ur: 37 mmol/L

## 2022-01-24 MED ORDER — NEBIVOLOL HCL 10 MG PO TABS
20.0000 mg | ORAL_TABLET | Freq: Every day | ORAL | Status: DC
  Administered 2022-01-24 – 2022-02-04 (×11): 20 mg via ORAL
  Filled 2022-01-24 (×12): qty 2

## 2022-01-24 MED ORDER — HYDRALAZINE HCL 50 MG PO TABS
50.0000 mg | ORAL_TABLET | Freq: Three times a day (TID) | ORAL | Status: DC
  Administered 2022-01-24 – 2022-02-03 (×26): 50 mg via ORAL
  Filled 2022-01-24 (×28): qty 1

## 2022-01-24 MED ORDER — HYDRALAZINE HCL 50 MG PO TABS
100.0000 mg | ORAL_TABLET | Freq: Three times a day (TID) | ORAL | Status: DC
Start: 2022-01-24 — End: 2022-01-24

## 2022-01-24 MED ORDER — HEPARIN SODIUM (PORCINE) 5000 UNIT/ML IJ SOLN
5000.0000 [IU] | Freq: Three times a day (TID) | INTRAMUSCULAR | Status: DC
  Administered 2022-01-24 – 2022-01-26 (×6): 5000 [IU] via SUBCUTANEOUS
  Filled 2022-01-24 (×6): qty 1

## 2022-01-24 NOTE — Consult Note (Signed)
Reason for Consult:AKI/CKD stage IIIb Referring Physician: Bonner Puna, MD  Carol [name] is an 61 y.o. female has a PMH significant for DM type 2, uncontrolled HTN, CAD s/p recent NSTEMI, cocaine use, and CKD stage IIIb who presented to [facility] ED on 01/22/22 with chest pain and SOB.  In the ED, BP was 210/101.  Labs notable for Cr 1.79, Co2 16, Hgb 11.3, BNP 618.4, and UDS + for cocaine.  She was admitted for hypertensive urgency.  CT angio of chest was negative for PE or dissection.  We were consulted due to the development of AKI/CKD Stage IIIb.  The trend in Scr is seen below.  She admits to using crack cocaine since April due to stressors in her life.  She also admits to taking ibuprofen 800 mg tid prior to admission.  She denies any N/V/D, dysuria, pyuria, hematuria, urgency, frequency, or retention.  Of note, she had been on metformin and Benicar-HCT prior to admission.  She was given IV furosemide with escalating doses until today.  She also had rapid over correction of her BP yesterday with SBP in the 80's-90's.  Trend in Creatinine: Creatinine, Ser  Date/Time Value Ref Range Status  01/24/2022 02:09 AM 4.00 (H) 0.44 - 1.00 mg/dL Corrected  01/23/2022 05:37 AM 2.48 (H) 0.44 - 1.00 mg/dL Final  01/22/2022 08:45 AM 1.79 (H) 0.44 - 1.00 mg/dL Final  01/10/2022 09:46 AM 1.47 (H) 0.44 - 1.00 mg/dL Final  01/03/2022 04:01 AM 2.38 (H) 0.44 - 1.00 mg/dL Final  01/02/2022 05:42 AM 2.40 (H) 0.44 - 1.00 mg/dL Final  01/01/2022 08:27 AM 2.03 (H) 0.44 - 1.00 mg/dL Final  12/30/2021 11:42 PM 2.16 (H) 0.44 - 1.00 mg/dL Final  12/25/2021 04:35 AM 2.37 (H) 0.44 - 1.00 mg/dL Final  12/24/2021 01:00 AM 3.05 (H) 0.44 - 1.00 mg/dL Final  12/23/2021 04:09 AM 3.19 (H) 0.44 - 1.00 mg/dL Final  12/22/2021 09:43 AM 3.18 (H) 0.44 - 1.00 mg/dL Final  12/21/2021 03:43 AM 1.93 (H) 0.44 - 1.00 mg/dL Final  12/20/2021 03:51 AM 1.72 (H) 0.44 - 1.00 mg/dL Final  11/26/2021 11:40 AM 1.69 (H) 0.44 - 1.00 mg/dL Final   11/21/2020 02:51 PM 1.36 (H) 0.50 - 1.05 mg/dL Final  09/05/2019 11:30 PM 1.23 (H) 0.44 - 1.00 mg/dL Final  04/26/2018 12:03 PM 1.17 (H) 0.44 - 1.00 mg/dL Final  03/21/2017 04:52 PM 1.29 (H) 0.44 - 1.00 mg/dL Final  09/12/2015 03:27 PM 2.73 (H) 0.44 - 1.00 mg/dL Final  04/06/2015 10:36 PM 0.80 0.44 - 1.00 mg/dL Final  07/30/2014 10:38 AM 1.2 0.4 - 1.2 mg/dL Final  02/01/2014 06:25 AM 1.73 (H) 0.50 - 1.10 mg/dL Final  01/31/2014 04:26 AM 2.50 (H) 0.50 - 1.10 mg/dL Final  01/30/2014 04:22 AM 4.04 (H) 0.50 - 1.10 mg/dL Final  01/29/2014 05:17 PM 5.38 (H) 0.50 - 1.10 mg/dL Final  02/04/2012 12:29 PM 0.74 0.50 - 1.10 mg/dL Final  09/22/2011 11:52 AM 0.70 0.50 - 1.10 mg/dL Final  07/03/2011 08:23 PM 0.90 0.50 - 1.10 mg/dL Final  10/26/2009 11:05 PM 0.8 0.4 - 1.2 mg/dL Final  04/10/2009 02:25 PM 0.71 0.4 - 1.2 mg/dL Final  02/25/2009 05:02 PM 0.8 0.4 - 1.2 mg/dL Final  02/23/2008 11:05 PM 0.62  Final  10/14/2007 12:40 PM 0.73  Final    PMH:   Past Medical History:  Diagnosis Date   Abnormal liver function tests    Acute kidney injury ([place]) 01/2014   Hospitalized, Volume depletion, nausea and vomiting   Anxiety  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   Chronic back pain    Depression    Diabetes mellitus    GERD (gastroesophageal reflux disease)    Heart murmur    Hypertension    NSTEMI (non-ST elevated myocardial infarction) ([place]) 12/20/2021   Palpitations    Parkinson's disease ([place])     PSH:   Past Surgical History:  Procedure Laterality Date   CARPAL TUNNEL RELEASE     LEFT HEART CATH AND CORONARY ANGIOGRAPHY N/A 12/20/2021   Procedure: LEFT HEART CATH AND CORONARY ANGIOGRAPHY;  Surgeon: Burnell Blanks, MD;  Location: [place] [facility];  Service: Cardiovascular;  Laterality: N/A;   OPEN REDUCTION INTERNAL FIXATION (ORIF) FINGER WITH RADIAL BONE GRAFT Right 09/12/2015   Procedure: RIGHT MIDDLE FINGER CLOSED REDUCTION AND PINNING;  Surgeon: Iran Planas,  MD;  Location: [place] [facility];  Service: Orthopedics;  Laterality: Right;   TONSILLECTOMY     TUBAL LIGATION      Allergies:  Allergies  Allergen Reactions   Penicillins Shortness Of Breath and Other (See Comments)    Caused yeast infection Has patient had a PCN reaction causing immediate rash, facial/tongue/throat swelling, SOB or lightheadedness with hypotension: Yes Has patient had a PCN reaction causing severe rash involving mucus membranes or skin necrosis: No Has patient had a PCN reaction that required hospitalization pt was in the hospital at the time of the reaction Has patient had a PCN reaction occurring within the last 10 years: No If all of the above answers are "NO", then may proceed with Cephalosp   Tramadol Other (See Comments)    Made her tongue raw    Medications:   Prior to Admission medications   Medication Sig Start Date End Date Taking? Authorizing Provider  albuterol (PROVENTIL HFA;VENTOLIN HFA) 108 (90 Base) MCG/ACT inhaler Inhale 2 puffs into the lungs every 4 (four) hours as needed for wheezing or shortness of breath.   Yes [provider]  amLODipine (NORVASC) 10 MG tablet Take 1 tablet (10 mg total) by mouth daily. Patient taking differently: Take 10 mg by mouth every morning. 11/26/21  Yes Varney Biles, MD  aspirin EC 81 MG EC tablet Take 1 tablet (81 mg total) by mouth daily. Swallow whole. Patient taking differently: Take 81 mg by mouth at bedtime. Swallow whole. 12/25/21  Yes Dunn, Dayna N, PA-C  atorvastatin (LIPITOR) 80 MG tablet Take 1 tablet (80 mg total) by mouth daily. Patient taking differently: Take 80 mg by mouth every morning. 12/25/21  Yes Dunn, Dayna N, PA-C  baclofen (LIORESAL) 10 MG tablet Take 10 mg by mouth daily as needed for muscle spasms.   Yes [provider]  budesonide-formoterol (SYMBICORT) 160-4.5 MCG/ACT inhaler Inhale 2 puffs into the lungs every morning. 11/09/21  Yes [provider]  cetirizine (ZYRTEC) 10 MG  tablet Take 10 mg by mouth every morning.   Yes [provider]  clopidogrel (PLAVIX) 75 MG tablet Take 1 tablet (75 mg total) by mouth daily. Patient taking differently: Take 75 mg by mouth every morning. 12/25/21  Yes Dunn, Dayna N, PA-C  cyclobenzaprine (FLEXERIL) 10 MG tablet Take 1 tablet (10 mg total) by mouth 3 (three) times daily as needed for muscle spasms. 05/18/19  Yes Domenic Moras, PA-C  ergocalciferol (VITAMIN D2) 1.25 MG (50000 UT) capsule Take 50,000 Units by mouth every Monday.   Yes [provider]  furosemide (LASIX) 40 MG tablet Take 1 tablet (40 mg total) by mouth 2 (two) times daily.  Patient taking differently: Take 40 mg by mouth 2 (two) times daily with breakfast and lunch. 01/03/22  Yes Shelly Coss, MD  gabapentin (NEURONTIN) 100 MG capsule Take 100 mg by mouth every morning.   Yes [provider]  glimepiride (AMARYL) 4 MG tablet Take 4 mg by mouth 2 (two) times daily. 01/12/22  Yes [provider]  hydrALAZINE (APRESOLINE) 50 MG tablet Take 1 tablet (50 mg total) by mouth every 8 (eight) hours. 01/03/22  Yes Shelly Coss, MD  hydrOXYzine (ATARAX) 50 MG tablet Take 50 mg by mouth daily as needed for anxiety or itching.   Yes [provider]  insulin glargine (LANTUS) 100 UNIT/ML Solostar Pen Inject 30 Units into the skin every morning.   Yes [provider]  insulin lispro (HUMALOG) 100 UNIT/ML KwikPen Inject 15 Units into the skin 3 (three) times daily after meals. 01/05/22  Yes [provider]  isosorbide mononitrate (IMDUR) 30 MG 24 hr tablet Take 1 tablet (30 mg total) by mouth daily. Patient taking differently: Take 30 mg by mouth every morning. 12/25/21  Yes Dunn, Dayna N, PA-C  metFORMIN (GLUCOPHAGE) 500 MG tablet Take 500 mg by mouth 2 (two) times daily.   Yes [provider]  Nebivolol HCl 20 MG TABS Take 20 mg by mouth every morning. 12/07/21  Yes [provider]  nitroGLYCERIN (NITROSTAT)  0.4 MG SL tablet Place 1 tablet (0.4 mg total) under the tongue every 5 (five) minutes as needed for chest pain (up to 3 doses). 12/24/21  Yes Dunn, Nedra Hai, PA-C  olmesartan-hydrochlorothiazide (BENICAR HCT) 40-25 MG tablet Take 1 tablet by mouth every morning. 01/12/22  Yes [provider]  pantoprazole (PROTONIX) 40 MG tablet Take 1 tablet (40 mg total) by mouth daily at 12 noon. Patient taking differently: Take 40 mg by mouth every morning. 02/02/14  Yes Regalado, Belkys A, MD  QUEtiapine (SEROQUEL XR) 300 MG 24 hr tablet Take 300 mg by mouth at bedtime. 12/07/21  Yes [provider]  sertraline (ZOLOFT) 50 MG tablet Take 50 mg by mouth every evening.   Yes [provider]  triamcinolone ointment (KENALOG) 0.1 % Apply 1 application. topically 2 (two) times daily.   Yes [provider]  glucose blood (FREESTYLE TEST STRIPS) test strip 1 each by Other route as needed for other. 01/03/22   Shelly Coss, MD  oxyCODONE-acetaminophen (PERCOCET/ROXICET) 5-325 MG tablet Take 1-2 tablets by mouth every 6 (six) hours as needed for severe pain. Patient not taking: Reported on 01/22/2022 08/31/15   Comer Locket, PA-C    Inpatient medications:  amLODipine  10 mg Oral q morning   aspirin EC  81 mg Oral Daily   atorvastatin  80 mg Oral q morning   clopidogrel  75 mg Oral q morning   gabapentin  100 mg Oral q morning   heparin injection (subcutaneous)  5,000 Units Subcutaneous Q8H   hydrALAZINE  50 mg Oral Q8H   insulin aspart  0-15 Units Subcutaneous TID WC   insulin aspart  10 Units Subcutaneous TID WC   insulin glargine-yfgn  20 Units Subcutaneous Daily   loratadine  10 mg Oral Daily   mometasone-formoterol  2 puff Inhalation BID   nebivolol  20 mg Oral Daily   pantoprazole  40 mg Oral q morning   QUEtiapine  300 mg Oral QHS   sertraline  50 mg Oral QPM   Vitamin D (Ergocalciferol)  50,000 Units Oral Q Mon    Discontinued  Meds:   Medications Discontinued  During This Encounter  Medication Reason   diphenhydramine-acetaminophen (TYLENOL PM) 25-500 MG TABS Patient Preference   ALPRAZolam (XANAX) 1 MG tablet Discontinued by provider   insulin aspart (NOVOLOG) 100 UNIT/ML FlexPen Change in therapy   Buprenorphine HCl-Naloxone HCl 4-1 MG FILM Discontinued by provider   nitroGLYCERIN (NITROSTAT) SL tablet 0.4 mg    furosemide (LASIX) injection 40 mg    furosemide (LASIX) injection 40 mg    nitroGLYCERIN 50 mg in dextrose 5 % 250 mL (0.2 mg/mL) infusion    hydrALAZINE (APRESOLINE) tablet 100 mg    hydrALAZINE (APRESOLINE) tablet 50 mg    hydrALAZINE (APRESOLINE) tablet 100 mg    enoxaparin (LOVENOX) injection 40 mg     Social History:  reports that she has been smoking cigarettes. She has a 0.75 pack-year smoking history. She has never used smokeless tobacco. She reports current alcohol use. She reports that she does not use drugs.  Family History:   Family History  Problem Relation Age of Onset   Diabetes Mother    Kidney disease Mother    Seizures Father    Hypertension Father    Stroke Father    Asthma Other     Pertinent items are noted in HPI. Weight change: -12.4 kg  Intake/Output Summary (Last 24 hours) at 01/24/2022 1239 Last data filed at 01/24/2022 1142 Gross per 24 hour  Intake 490 ml  Output 450 ml  Net 40 ml   BP (!) 153/79 (BP Location: Right Arm)   Pulse 88   Temp 97.8 F (36.6 C) (Oral)   Resp 20   Ht 5\' 4"  (1.626 m)   Wt 85.9 kg   SpO2 97%   BMI 32.51 kg/m  Vitals:   01/24/22 0408 01/24/22 0808 01/24/22 0916 01/24/22 1141  BP: 107/63  (!) 153/73 (!) 153/79  Pulse: 67 92 92 88  Resp: 13 18 20 20   Temp: 97.9 F (36.6 C)  98.3 F (36.8 C) 97.8 F (36.6 C)  TempSrc: Oral  Oral Oral  SpO2: 96% 97% 97% 97%  Weight: 85.9 kg     Height:         General appearance: alert, cooperative, and no distress Head: Normocephalic, without obvious abnormality, atraumatic Resp: clear to auscultation  bilaterally Cardio: regular rate and rhythm, S1, S2 normal, no murmur, click, rub or gallop GI: soft, non-tender; bowel sounds normal; no masses,  no organomegaly Extremities: extremities normal, atraumatic, no cyanosis or edema  Labs: Basic Metabolic Panel: Recent Labs  Lab 01/22/22 0845 01/23/22 0537 01/24/22 0209  NA 140 140 133*  K 3.6 3.7 3.6  CL 110 111 102  CO2 16* 20* 20*  GLUCOSE 132* 156* 119*  BUN 10 17 27*  CREATININE 1.79* 2.48* 4.00*  ALBUMIN 2.7*  --   --   CALCIUM 8.9 8.5* 8.0*   Liver Function Tests: Recent Labs  Lab 01/22/22 0845  AST 31  ALT 13  ALKPHOS 123  BILITOT 0.4  PROT 6.8  ALBUMIN 2.7*   No results for input(s): LIPASE, AMYLASE in the last 168 hours. No results for input(s): AMMONIA in the last 168 hours. CBC: Recent Labs  Lab 01/22/22 0845 01/23/22 0537 01/24/22 0209  WBC 6.3 4.0 4.5  HGB 11.3* 10.2* 9.4*  HCT 34.4* 30.4* 27.9*  MCV 89.8 88.1 89.1  PLT 214 193 156   PT/INR: @LABRCNTIP (inr:5) Cardiac Enzymes: )No results for input(s): CKTOTAL, CKMB, CKMBINDEX, TROPONINI in the last 168 hours. CBG: Recent  Labs  Lab 01/23/22 1721 01/23/22 2107 01/23/22 2202 01/24/22 0603 01/24/22 1137  GLUCAP 194* 46* 115* 176* 227*    Iron Studies: No results for input(s): IRON, TIBC, TRANSFERRIN, FERRITIN in the last 168 hours.  Xrays/Other Studies: US RENAL  Result Date: 01/24/2022 CLINICAL DATA:  Acute kidney injury. EXAM: RENAL / URINARY TRACT ULTRASOUND COMPLETE COMPARISON:  04/01/2019. FINDINGS: Right Kidney: Renal measurements: 12.5 x 4.4 x 4.8 cm = volume: 139 mL. Echogenicity within normal limits. No mass or hydronephrosis visualized. Left Kidney: Renal measurements: 11.3 x 5.9 x 4.5 cm = volume: 157 mL. Echogenicity within normal limits. No mass or hydronephrosis visualized. Bladder: Appears normal for degree of bladder distention. Other: Incidental shadowing echogenic stone in the gallbladder measures 1.3 cm. IMPRESSION: 1. No  acute findings. 2. Incidentally imaged cholelithiasis. Electronically Signed   By: Lorin Picket M.D.   On: 01/24/2022 11:04   CT Angio Chest Aorta W and/or Wo Contrast  Result Date: 01/22/2022 CLINICAL DATA:  Chest pain and shortness of breath. Possible acute aortic syndrome. EXAM: CT ANGIOGRAPHY CHEST WITH CONTRAST TECHNIQUE: Multidetector CT imaging of the chest was performed using the standard protocol during bolus administration of intravenous contrast. Multiplanar CT image reconstructions and MIPs were obtained to evaluate the vascular anatomy. RADIATION DOSE REDUCTION: This exam was performed according to the departmental dose-optimization program which includes automated exposure control, adjustment of the mA and/or kV according to patient size and/or use of iterative reconstruction technique. CONTRAST:  32mL OMNIPAQUE IOHEXOL 350 MG/ML SOLN COMPARISON:  Chest radiograph 01/22/2022 FINDINGS: Cardiovascular: Contrast medium phase was timed for systemic arterial opacification. Contrast in the pulmonary arterial tree is somewhat dilute, and today's exam has a low sensitivity in assessing for pulmonary embolus. Atherosclerotic calcification of the aortic arch and descending thoracic aorta noted. Thickening of the interventricular septum at 2.0 cm suggesting left ventricular hypertrophy. No aortic dissection or acute thoracic aortic findings. Mild cardiomegaly. Mediastinum/Nodes: Solid 2.7 cm left thyroid nodule. My review of the imaging record does not indicate that this has been previously assessed sub-centimeter nodules in the isthmus and right thyroid lobe. Scattered mostly small mediastinal lymph nodes. Right paratracheal node anterior to the carina is borderline prominent at 1.0 cm in diameter on image 32 series 5. Lungs/Pleura: Mild reticular subpleural interstitial accentuation in the lungs. Upper Abdomen: Possible hepatic steatosis. 1.2 cm gallstone in the gallbladder on image 96 series 5.  Musculoskeletal: Unremarkable Review of the MIP images confirms the above findings. IMPRESSION: 1. No acute aortic findings. 2. Mild cardiomegaly and left ventricular hypertrophy. 3. Solid 2.7 cm left thyroid nodule. Recommend thyroid US (ref: J Am Coll Radiol. 2015 Feb;12(2): 143-50). 4. Aortic Atherosclerosis (ICD10-I70.0). 5. Possible hepatic steatosis. 6. Cholelithiasis. 7. Mild subpleural reticular interstitial accentuation in the lungs, nonspecific. No substantial advancement compared to the appearance of the lung bases on 01/15/2020. Electronically Signed   By: Van Clines M.D.   On: 01/22/2022 15:20     Assessment/Plan:  AKI/CKD stage IIIb - multiple renal insults including cocaine, hypertensive urgency with rapid over correction and hypotension with concomitant ARB, as well as CIN from IV contrast +/- NSAIDs.  Currently oliguric with only 350 mL documented thus far.  She has no uremic signs or symptoms.  Continue with current BP regimen.  Continue to hold ARB, HCTZ, and metformin.  No indication for dialysis at this time.  Will continue to follow closely. HTN urgency - improved.  Continue with amlodipine and hydralazine.  Nebivolol on hold due to cocaine +,  ARB/HCTZ on hold due to AKI. Acute on chronic diastolic CHF - improved.  Lasix on hold due to AKI. Anemia of CKD stage IIIb - follow H/H and transfuse prn.  Will check iron stores.  Hold off on ESA for now. CAD - with demand ischemia.  Improved. Cocaine abuse - we discussed the risks to her health with ongoing use.  She reports that she had been clean for 20 years until this April.  Is willing to stop and move out of her current situation.  DM type 2- per primary.   Governor [place]ook[place] Dewarren Ledbetter 01/24/2022, 12:39 PM

## 2022-01-24 NOTE — Evaluation (Signed)
Occupational Therapy Evaluation Patient Details Name: Carol [name] MRN: [id number] DOB: [birth year] Today's Date: 01/24/2022   History of Present Illness Patient is a 61 y/o female who presents on 01/22/22 with chest pain, SOB (in setting of cocaine withdrawal) and heart palpitations. Admitted with HTN emergency and acute on chronic CHF. CXR-pulmonary edema. Recently admitted 5/13-5/17 for hypoglycemia, elevated BP and 5/3-5/7 for NSTEMI and had LHC of 5/3 showing 100% stenosis of a small sub-branch of the right PDA. PMH includes anxiety, chronic back pain, depression, DM, HTN, PD, NSTEMI, cocaine use.   Clinical Impression   Pt independent at baseline with ADLs and used RW for functional mobility, although reports difficulty with ADLs due to fatigue. Pt lives with daughter who can provide PRN assist at d/c. Pt currently requiring min guard -min A for ADLs, supervision for bed mobility, and min guard for transfers with RW. Began education on energy precaution strategies, including use of shower chair for bathing. Pt presenting with impairments listed below, will follow acutely. Recommend HHOT at d/c pending progress.      Recommendations for follow up therapy are one component of a multi-disciplinary discharge planning process, led by the attending physician.  Recommendations may be updated based on patient status, additional functional criteria and insurance authorization.   Follow Up Recommendations  Home health OT    Assistance Recommended at Discharge Set up Supervision/Assistance  Patient can return home with the following A little help with bathing/dressing/bathroom;Assistance with cooking/housework;Help with stairs or ramp for entrance;Assist for transportation    Functional Status Assessment  Patient has had a recent decline in their functional status and demonstrates the ability to make significant improvements in function in a reasonable and predictable amount of time.   Equipment Recommendations  BSC/3in1    Recommendations for Other Services       Precautions / Restrictions Precautions Precautions: Fall;Other (comment) Precaution Comments: watch BP Restrictions Weight Bearing Restrictions: No      Mobility Bed Mobility Overal bed mobility: Needs Assistance Bed Mobility: Supine to Sit     Supine to sit: Supervision     General bed mobility comments: increased time    Transfers Overall transfer level: Needs assistance Equipment used: Rolling walker (2 wheels), None Transfers: Sit to/from Stand Sit to Stand: Min guard           General transfer comment: cues for hand placement      Balance Overall balance assessment: Needs assistance, History of Falls Sitting-balance support: Feet supported, No upper extremity supported Sitting balance-Leahy Scale: Good     Standing balance support: During functional activity Standing balance-Leahy Scale: Fair Standing balance comment: stands statically at sink for ADL                           ADL either performed or assessed with clinical judgement   ADL Overall ADL's : Needs assistance/impaired Eating/Feeding: Modified independent;Sitting   Grooming: Min guard;Standing;Oral care;Wash/dry hands;Wash/dry face Grooming Details (indicate cue type and reason): standing at sink Upper Body Bathing: Minimal assistance;Sitting   Lower Body Bathing: Minimal assistance;Sitting/lateral leans   Upper Body Dressing : Minimal assistance;Sitting   Lower Body Dressing: Sitting/lateral leans;Minimal assistance Lower Body Dressing Details (indicate cue type and reason): to pull up socks Toilet Transfer: Min guard;Ambulation;Regular Toilet;Rolling walker (2 wheels) Toilet Transfer Details (indicate cue type and reason): simulated in room Toileting- Clothing Manipulation and Hygiene: Supervision/safety;Sitting/lateral lean;Sit to/from stand       Functional mobility during  ADLs: Min  guard;Rolling walker (2 wheels)       Vision   Vision Assessment?: No apparent visual deficits     Perception     Praxis      Pertinent Vitals/Pain Pain Assessment Pain Assessment: Faces Pain Score: 2  Faces Pain Scale: Hurts a little bit Pain Location: back, bil feet Pain Descriptors / Indicators: Sore, Discomfort Pain Intervention(s): Limited activity within patient's tolerance, Monitored during session, Repositioned     Hand Dominance     Extremity/Trunk Assessment Upper Extremity Assessment Upper Extremity Assessment: Overall WFL for tasks assessed   Lower Extremity Assessment Lower Extremity Assessment: Defer to PT evaluation   Cervical / Trunk Assessment Cervical / Trunk Assessment: Normal   Communication Communication Communication: No difficulties   Cognition Arousal/Alertness: Awake/alert Behavior During Therapy: WFL for tasks assessed/performed Overall Cognitive Status: Within Functional Limits for tasks assessed                                 General Comments: for basic mobility tasks     General Comments  VSS on RA    Exercises     Shoulder Instructions      Home Living Family/patient expects to be discharged to:: Private residence Living Arrangements: Alone Available Help at Discharge: Family;Available PRN/intermittently Type of Home: House Home Access: Level entry     Home Layout: One level     Bathroom Shower/Tub: Teacher, early years/pre: Standard     Home Equipment: Conservation officer, nature (2 wheels);Cane - single point   Additional Comments: Plans to stay with daughter      Prior Functioning/Environment Prior Level of Function : Independent/Modified Independent             Mobility Comments: uses RW for ambulation, cooks/clean, drives ADLs Comments: independent however reports difficulty with ADL tasks due to SOB.        OT Problem List: Decreased activity tolerance;Impaired balance (sitting and/or  standing);Decreased safety awareness      OT Treatment/Interventions: Self-care/ADL training;Therapeutic exercise;Therapeutic activities;Patient/family education;Balance training;Energy conservation;DME and/or AE instruction    OT Goals(Current goals can be found in the care plan section) Acute Rehab OT Goals Patient Stated Goal: to go home OT Goal Formulation: With patient Time For Goal Achievement: 02/07/22 Potential to Achieve Goals: Good ADL Goals Pt Will Perform Upper Body Dressing: with modified independence;standing;sitting Pt Will Perform Lower Body Dressing: with modified independence;sitting/lateral leans;sit to/from stand Pt Will Perform Tub/Shower Transfer: Shower transfer;rolling walker;ambulating;3 in 1  OT Frequency: Min 2X/week    Co-evaluation              AM-PAC OT "6 Clicks" Daily Activity     Outcome Measure Help from another person eating meals?: None Help from another person taking care of personal grooming?: None Help from another person toileting, which includes using toliet, bedpan, or urinal?: None Help from another person bathing (including washing, rinsing, drying)?: A Little Help from another person to put on and taking off regular upper body clothing?: None Help from another person to put on and taking off regular lower body clothing?: A Little 6 Click Score: 22   End of Session Equipment Utilized During Treatment: Gait belt;Rolling walker (2 wheels) Nurse Communication: Mobility status  Activity Tolerance: Patient tolerated treatment well Patient left: Other (comment) (sitting EOB handoff to PT)  OT Visit Diagnosis: Unsteadiness on feet (R26.81);Other abnormalities of gait and mobility (R26.89);Muscle weakness (generalized) (M62.81)  Time: 1415-1430 OT Time Calculation (min): 15 min Charges:  OT General Charges $OT Visit: 1 Visit OT Evaluation $OT Eval Low Complexity: 1 Low  Lynnda Child, OTD, OTR/L Acute Rehab [phone]) [phone]  - [place] 01/24/2022, 4:23 PM

## 2022-01-24 NOTE — Progress Notes (Addendum)
Progress Note  Patient Name: Maanvi Lecompte Date of Encounter: 01/24/2022  [facility] Cardiologist: Janina Mayo, MD   Subjective   Mrs. Kowal feels better. No significant SOB. No chest pain  Inpatient Medications    Scheduled Meds:  amLODipine  10 mg Oral q morning   aspirin EC  81 mg Oral Daily   atorvastatin  80 mg Oral q morning   clopidogrel  75 mg Oral q morning   enoxaparin (LOVENOX) injection  40 mg Subcutaneous Q24H   gabapentin  100 mg Oral q morning   hydrALAZINE  50 mg Oral Q8H   insulin aspart  0-15 Units Subcutaneous TID WC   insulin aspart  10 Units Subcutaneous TID WC   insulin glargine-yfgn  20 Units Subcutaneous Daily   loratadine  10 mg Oral Daily   mometasone-formoterol  2 puff Inhalation BID   pantoprazole  40 mg Oral q morning   QUEtiapine  300 mg Oral QHS   sertraline  50 mg Oral QPM   Vitamin D (Ergocalciferol)  50,000 Units Oral Q Mon   Continuous Infusions:   PRN Meds: albuterol, cyclobenzaprine, hydrOXYzine, LORazepam, oxyCODONE-acetaminophen   Vital Signs    Vitals:   01/24/22 0041 01/24/22 0408 01/24/22 0808 01/24/22 0916  BP: 105/60 107/63  (!) 153/73  Pulse: 67 67 92 92  Resp: 15 13 18 20   Temp:  97.9 F (36.6 C)  98.3 F (36.8 C)  TempSrc:  Oral  Oral  SpO2:  96% 97% 97%  Weight:  85.9 kg    Height:        Intake/Output Summary (Last 24 hours) at 01/24/2022 0926 Last data filed at 01/24/2022 0600 Gross per 24 hour  Intake 250 ml  Output --  Net 250 ml      01/24/2022    4:08 AM 01/23/2022    4:29 AM 01/22/2022    8:05 PM  Last 3 Weights  Weight (lbs) 189 lb 6.4 oz 193 lb 12.6 oz 192 lb 10.9 oz  Weight (kg) 85.911 kg 87.9 kg 87.4 kg      Telemetry    NSR - Personally Reviewed  ECG    NA - Personally Reviewed  Physical Exam   Vitals:   01/24/22 0808 01/24/22 0916  BP:  (!) 153/73  Pulse: 92 92  Resp: 18 20  Temp:  98.3 F (36.8 C)  SpO2: 97% 97%    GEN: No acute distress.   Neck: no sig  JVD Cardiac: RRR, no murmurs, rubs, or gallops.  Respiratory: Clear to auscultation bilaterally. GI: Soft, nontender, non-distended  MS: No edema; No deformity. Neuro:  Nonfocal  Psych: Normal affect   Labs    High Sensitivity Troponin:   Recent Labs  Lab 01/10/22 0946 01/10/22 1226 01/22/22 0845 01/22/22 1348 01/22/22 1724  TROPONINIHS 39* 31* 50* 53* 40*     Chemistry Recent Labs  Lab 01/22/22 0845 01/23/22 0537 01/24/22 0209  NA 140 140 133*  K 3.6 3.7 3.6  CL 110 111 102  CO2 16* 20* 20*  GLUCOSE 132* 156* 119*  BUN 10 17 27*  CREATININE 1.79* 2.48* 4.00*  CALCIUM 8.9 8.5* 8.0*  PROT 6.8  --   --   ALBUMIN 2.7*  --   --   AST 31  --   --   ALT 13  --   --   ALKPHOS 123  --   --   BILITOT 0.4  --   --  GFRNONAA 32* 22* 12*  ANIONGAP 14 9 11     Lipids No results for input(s): CHOL, TRIG, HDL, LABVLDL, LDLCALC, CHOLHDL in the last 168 hours.  Hematology Recent Labs  Lab 01/22/22 0845 01/23/22 0537 01/24/22 0209  WBC 6.3 4.0 4.5  RBC 3.83* 3.45* 3.13*  HGB 11.3* 10.2* 9.4*  HCT 34.4* 30.4* 27.9*  MCV 89.8 88.1 89.1  MCH 29.5 29.6 30.0  MCHC 32.8 33.6 33.7  RDW 13.1 13.2 13.5  PLT 214 193 156   Thyroid  Recent Labs  Lab 01/24/22 0209  TSH 1.374  FREET4 0.61    BNP Recent Labs  Lab 01/22/22 0845  BNP 618.4*    DDimer No results for input(s): DDIMER in the last 168 hours.   Radiology    DG Chest 1 View  Result Date: 01/22/2022 CLINICAL DATA:  Headache, chest pain, shortness of breath EXAM: CHEST  1 VIEW COMPARISON:  01/10/2022 FINDINGS: Similar pulmonary vascular congestion and mild interstitial prominence. No pleural effusion. No pneumothorax. Stable mild cardiomegaly. IMPRESSION: Mild cardiomegaly. Pulmonary vascular congestion and mild interstitial prominence that may reflect edema. Electronically Signed   By: Macy Mis M.D.   On: 01/22/2022 09:42   CT Angio Chest Aorta W and/or Wo Contrast  Result Date: 01/22/2022 CLINICAL DATA:   Chest pain and shortness of breath. Possible acute aortic syndrome. EXAM: CT ANGIOGRAPHY CHEST WITH CONTRAST TECHNIQUE: Multidetector CT imaging of the chest was performed using the standard protocol during bolus administration of intravenous contrast. Multiplanar CT image reconstructions and MIPs were obtained to evaluate the vascular anatomy. RADIATION DOSE REDUCTION: This exam was performed according to the departmental dose-optimization program which includes automated exposure control, adjustment of the mA and/or kV according to patient size and/or use of iterative reconstruction technique. CONTRAST:  69mL OMNIPAQUE IOHEXOL 350 MG/ML SOLN COMPARISON:  Chest radiograph 01/22/2022 FINDINGS: Cardiovascular: Contrast medium phase was timed for systemic arterial opacification. Contrast in the pulmonary arterial tree is somewhat dilute, and today's exam has a low sensitivity in assessing for pulmonary embolus. Atherosclerotic calcification of the aortic arch and descending thoracic aorta noted. Thickening of the interventricular septum at 2.0 cm suggesting left ventricular hypertrophy. No aortic dissection or acute thoracic aortic findings. Mild cardiomegaly. Mediastinum/Nodes: Solid 2.7 cm left thyroid nodule. My review of the imaging record does not indicate that this has been previously assessed sub-centimeter nodules in the isthmus and right thyroid lobe. Scattered mostly small mediastinal lymph nodes. Right paratracheal node anterior to the carina is borderline prominent at 1.0 cm in diameter on image 32 series 5. Lungs/Pleura: Mild reticular subpleural interstitial accentuation in the lungs. Upper Abdomen: Possible hepatic steatosis. 1.2 cm gallstone in the gallbladder on image 96 series 5. Musculoskeletal: Unremarkable Review of the MIP images confirms the above findings. IMPRESSION: 1. No acute aortic findings. 2. Mild cardiomegaly and left ventricular hypertrophy. 3. Solid 2.7 cm left thyroid nodule.  Recommend thyroid US (ref: J Am Coll Radiol. 2015 Feb;12(2): 143-50). 4. Aortic Atherosclerosis (ICD10-I70.0). 5. Possible hepatic steatosis. 6. Cholelithiasis. 7. Mild subpleural reticular interstitial accentuation in the lungs, nonspecific. No substantial advancement compared to the appearance of the lung bases on 01/15/2020. Electronically Signed   By: Van Clines M.D.   On: 01/22/2022 15:20    Cardiac Studies   TTE 12/20/2021  1. Left ventricular ejection fraction, by estimation, is 60 to 65%. The  left ventricle has normal function. The left ventricle has no regional  wall motion abnormalities. There is mild left ventricular hypertrophy.  Left  ventricular diastolic parameters  are consistent with Grade I diastolic dysfunction (impaired relaxation).   2. Right ventricular systolic function is normal. The right ventricular  size is normal.   3. Left atrial size was mildly dilated.   4. The mitral valve is normal in structure. Trivial mitral valve  regurgitation. No evidence of mitral stenosis.   5. The aortic valve is normal in structure. Aortic valve regurgitation is  not visualized. No aortic stenosis is present.   6. The inferior vena cava is normal in size with greater than 50%  respiratory variability, suggesting right atrial pressure of 3 mmHg.  12/20/2021    Inf Sept lesion is 100% stenosed.   Mid RCA lesion is 20% stenosed.   Mid LAD lesion is 20% stenosed.   NSTEMI with culprit vessel felt to be a very small sub-Kayshaun Polanco off of the right PDA. This is a 1.5 mm vessel and supplies a small portion of myocardium.  Mild disease in the mid LAD and mid RCA LVEDP=15 mmHg   Recommendations: Medical management of CAD. I will start Plavix and continue ASA, beta blocker and statin.   Patient Profile     Evangela Heffler is a 61 y.o. female with a hx of hypertension, HLD, type 2 DM, remote cocaine use planning to live with family and work on recovery, She had LHC 5/3 that  showed sub Debbi Strandberg off of the right PDA. Medical management was recommended, who is being seen initially on 01/22/2022 for the evaluation of chest pain and SOB at the request of Dr. Jeanell Sparrow.  Assessment & Plan    #Hypertensive emergency: in the setting of cocaine use. Blood pressures still elevated. Will be more aggressive with her blood pressure. - restarted home nebivolol 20 mg daily - continue norvasc 10 mg daily - continue hydralazine 50 mg TID - hold ARB; can't do spironolactone with renal function  #Ischemic Heart Disease: small sub Jedd Schulenburg off the R PDA disease. Continue aspirin 81 mg daily and plavix 75 mg daily. Continue lipitor 80 mg daily  #Diastolic CHF: in the setting of cocaine use. Her BNP was elevated to 600s and she was c/o SOB. She is not floridly volume overloaded. Would hold diuretic still  #AKI: Crt 1.79 -> 2.48->4.0.  in the setting of hypertensive emergency, post contrast and cocaine use. Continuing to increase. Would engage nephrology  For questions or updates, please contact [place] Please consult [email or web address] for contact info under       Signed, Janina Mayo, MD  01/24/2022, 9:26 AM

## 2022-01-24 NOTE — Progress Notes (Signed)
Physical Therapy Treatment Patient Details Name: Carol [name] MRN: [id number] DOB: 11/17/1960 Today's Date: 01/24/2022   History of Present Illness Patient is a 61 y/o female who presents on 01/22/22 with chest pain, SOB (in setting of cocaine withdrawal) and heart palpitations. Admitted with HTN emergency and acute on chronic CHF. CXR-pulmonary edema. Recently admitted 5/13-5/17 for hypoglycemia, elevated BP and 5/3-5/7 for NSTEMI and had LHC of 5/3 showing 100% stenosis of a small sub-branch of the right PDA. PMH includes anxiety, chronic back pain, depression, DM, HTN, PD, NSTEMI, cocaine use.    PT Comments    Patient progressing well towards PT goals. Pt in good spirits this session, reporting less pain in bil feet and back\. Continues to have some swelling resulting in gait abnormalities. Session focused on gait progression and functional mobility. Performed 5xSTS in 14.9 seconds (norm for her age is 11.4 sec) indicating decreased functional strength, impaired balance and fall risk. Pt noted to have SOB with activity needing standing rest break. Recommend [place] aide to assist with ADLs/IADLs at home. Will follow.   Recommendations for follow up therapy are one component of a multi-disciplinary discharge planning process, led by the attending physician.  Recommendations may be updated based on patient status, additional functional criteria and insurance authorization.  Follow Up Recommendations  Other (comment) ([place] aide)     Assistance Recommended at Discharge Intermittent Supervision/Assistance  Patient can return home with the following A little help with bathing/dressing/bathroom;A little help with walking and/or transfers;Assistance with cooking/housework;Help with stairs or ramp for entrance;Assist for transportation   Equipment Recommendations  None recommended by PT    Recommendations for Other Services       Precautions / Restrictions Precautions Precautions: Fall;Other  (comment) Precaution Comments: watch BP Restrictions Weight Bearing Restrictions: No     Mobility  Bed Mobility               General bed mobility comments: Sitting EOB upon PT arrival,.    Transfers Overall transfer level: Needs assistance Equipment used: Rolling walker (2 wheels), None Transfers: Sit to/from Stand             General transfer comment: Stood from [employer], supervision for safety with and without use of RW.    Ambulation/Gait Ambulation/Gait assistance: Min guard Gait Distance (Feet): 60 Feet Assistive device: Rolling walker (2 wheels) Gait Pattern/deviations: Decreased stride length, Trunk flexed, Narrow base of support, Step-through pattern Gait velocity: decreased     General Gait Details: Slow, mostly steady gait with use of RW, close min guard for safety. Walking more on lateral part of feet. Sp02 remained >90% on RA, 2/4 DOE. 1 standing rest break needed.   Stairs             Wheelchair Mobility    Modified Rankin (Stroke Patients Only)       Balance Overall balance assessment: Needs assistance, History of Falls Sitting-balance support: Feet supported, No upper extremity supported Sitting balance-Leahy Scale: Good     Standing balance support: During functional activity Standing balance-Leahy Scale: Fair Standing balance comment: Able to stand statically without UE support, requires UE support for dynamic tasks.                            Cognition Arousal/Alertness: Awake/alert Behavior During Therapy: WFL for tasks assessed/performed Overall Cognitive Status: Within Functional Limits for tasks assessed  General Comments: for basic mobility tasks        Exercises Other Exercises Other Exercises: sit to stand from EOB x6 for strengthening.    General Comments General comments (skin integrity, edema, etc.): Performed 5xSTS in 14.9 seconds (norm for her age is  11.4 sec) indicating decreased functional strength, impaired balance and fall risk.      Pertinent Vitals/Pain Pain Assessment Pain Assessment: Faces Faces Pain Scale: Hurts a little bit Pain Location: back, bil feet Pain Descriptors / Indicators: Sore, Discomfort Pain Intervention(s): Repositioned, Monitored during session    [place] expects to be discharged to:: Private residence Living Arrangements: Alone Available Help at Discharge: Family;Available PRN/intermittently Type of Home: House Home Access: Level entry       Home Layout: One level Home Equipment: Conservation officer, nature (2 wheels);Cane - single point Additional Comments: Plans to stay with daughter    Prior Function            PT Goals (current goals can now be found in the care plan section) Progress towards PT goals: Progressing toward goals    Frequency    Min 3X/week      PT Plan Current plan remains appropriate    Co-evaluation              AM-PAC PT "6 Clicks" Mobility   Outcome Measure  Help needed turning from your back to your side while in a flat bed without using bedrails?: None Help needed moving from lying on your back to sitting on the side of a flat bed without using bedrails?: None Help needed moving to and from a bed to a chair (including a wheelchair)?: A Little Help needed standing up from a chair using your arms (e.g., wheelchair or bedside chair)?: A Little Help needed to walk in hospital room?: A Little Help needed climbing 3-5 steps with a railing? : A Little 6 Click Score: 20    End of Session Equipment Utilized During Treatment: Gait belt Activity Tolerance: Patient tolerated treatment well Patient left: in bed;with call bell/phone within reach;with bed alarm set Nurse Communication: Mobility status PT Visit Diagnosis: Muscle weakness (generalized) (M62.81);Unsteadiness on feet (R26.81);Other (comment) (SOB)     Time: 3875-6433 PT Time Calculation  (min) (ACUTE ONLY): 20 min  Charges:  $Therapeutic Activity: 8-22 mins                     Marisa Severin, PT, DPT Acute Rehabilitation Services Secure chat preferred Office [phone]      Marguarite Arbour A Lucah Petta 01/24/2022, 4:02 PM

## 2022-01-24 NOTE — Progress Notes (Addendum)
Progress Note  Patient: Carol [name] CZY:[id number] DOB: 07-Feb-1961  DOA: 01/22/2022  DOS: 01/24/2022    Brief hospital course: Carol [name] is a 61 y.o. female with a history of IDT2DM, uncontrolled HTN, cocaine use, stage IIIb CKD, CAD s/p recent NSTEMI managed medically who presented 6/5 with chest pain and dyspnea. She was cocaine positive, appeared volume overloaded with dependent edema. Troponin was 50. CXR suggested interstitial edema. CTA chest showed no aortic dissection, CT head nonacute. Cardiology was consulted and the patient was admitted on nitroglycerin infusion with hypertensive urgency. Mild troponin elevation is felt to be due to demand ischemia, and with improvement in HTN, symptoms have resolved. IV lasix was started but stopped the day after admission. Unfortunately, she has developed oliguric AKI, with creatinine trending upward to 4. Nephrology is consulted and work up underway.   Assessment and Plan: Uncontrolled HTN with HTN urgency: Precipitated by cocaine +/- incomplete medication adherence. BP significantly improved.  - Continue amlodipine 10 mg, hydralazine 50mg  po TID (home dose), can increase this if becomes severely hypertensive again.  - Holding nebivolol with +cocaine, would continue to avoid cardioselective beta blockers going forward. - Restarted imdur, stopped NTG gtt.  - Hold ARB/thiazide.   AKI on CKD IIIb: SCr 1.79 in setting of HTN urgency, ARB/thiazide use, cocaine use. Worsened with quick improvement in BP and after IV lasix and contrasted CTA chest.  - Continue to monitor UOP closely - Check renal U/S, check urine micro, urine protein and sodium.  - NAGMA actually improved from admission and stable. Mild hyponatremia also noted, nothing to indicate urgent dialysis.  Acute on chronic HFpEF: CXR showing vascular congestion and pulmonary edema.  BNP elevated to 618. - Holding lasix with AKI, though pt reports beginning to feel  "puffy." - HTN medications as above  Thyroid nodule: Seen on CTA. TSH and free T4 wnl.  - Needs U/S as outpatient, consideration of further FNA based on characteristics. Check TSH, T4.   Demand myocardial ischemia due to cocaine intoxication/HTN urgency in pt with known CAD. Not consistent with ACS, plan outpatient follow up and risk factor modification. hx NSTEMI and had LHC of 5/3 showing 100% stenosis of a small sub-branch of the right PDA. Medical management with DAPT, beta blocker, and statin was recommended.  -continue aspirin and plavix, statin, holding BB w/+cocaine.  Cocaine use: +UDS.  - Cessation counseling provided.  IDT2DM: HbA1c 6.4%.  -home regimen on 30 Units Lantus and 15 units Humalog TID -start with 20 units in the morning and 10units TID with moderate SSI. Caution titrating upward with AKI.  Anemia of CKD: Hgb down to 9.4 without bleeding.  - Monitor in AM - Change VTE ppx to heparin  HLD:  - Continue statin  Prolonged QT interval: QTc of 514 on EKG.   - Avoid QT prolongation medication.  Obesity: Estimated body mass index is 32.51 kg/m as calculated from the following:   Height as of this encounter: 5\' 4"  (1.626 m).   Weight as of this encounter: 85.9 kg.  Subjective: Chronica back pain and pain all over is unchanged, though seems worse since she got here while laying in bed. No chest pain or dyspnea currently. Only urinated once yesterday morning, none since. Has been taking po. Hands are feeling puffy.   Objective: Vitals:   01/24/22 0408 01/24/22 0808 01/24/22 0916 01/24/22 1141  BP: 107/63  (!) 153/73 (!) 153/79  Pulse: 67 92 92 88  Resp: 13 18 20  20  Temp: 97.9 F (36.6 C)  98.3 F (36.8 C) 97.8 F (36.6 C)  TempSrc: Oral  Oral Oral  SpO2: 96% 97% 97% 97%  Weight: 85.9 kg     Height:       Gen: 61 y.o. female in no distress Pulm: Nonlabored breathing room air. Clear. CV: Regular rate and rhythm. No murmur, rub, or gallop. No JVD,  mild  diffuse edema. GI: Abdomen soft, non-tender, non-distended, with normoactive bowel sounds.  Ext: Warm, no deformities Skin: No rashes, lesions or ulcers on visualized skin. Neuro: Alert and oriented. No focal neurological deficits. Psych: Judgement and insight appear fair. Mood euthymic & affect congruent. Behavior is appropriate.    Data Personally reviewed:  CBC: Recent Labs  Lab 01/22/22 0845 01/23/22 0537 01/24/22 0209  WBC 6.3 4.0 4.5  HGB 11.3* 10.2* 9.4*  HCT 34.4* 30.4* 27.9*  MCV 89.8 88.1 89.1  PLT 214 193 093   Basic Metabolic Panel: Recent Labs  Lab 01/22/22 0845 01/23/22 0537 01/24/22 0209  NA 140 140 133*  K 3.6 3.7 3.6  CL 110 111 102  CO2 16* 20* 20*  GLUCOSE 132* 156* 119*  BUN 10 17 27*  CREATININE 1.79* 2.48* 4.00*  CALCIUM 8.9 8.5* 8.0*   GFR: Estimated Creatinine Clearance: 16.1 mL/min (A) (by C-G formula based on SCr of 4 mg/dL (H)). Liver Function Tests: Recent Labs  Lab 01/22/22 0845  AST 31  ALT 13  ALKPHOS 123  BILITOT 0.4  PROT 6.8  ALBUMIN 2.7*   No results for input(s): LIPASE, AMYLASE in the last 168 hours. No results for input(s): AMMONIA in the last 168 hours. Coagulation Profile: No results for input(s): INR, PROTIME in the last 168 hours. Cardiac Enzymes: No results for input(s): CKTOTAL, CKMB, CKMBINDEX, TROPONINI in the last 168 hours. BNP (last 3 results) No results for input(s): PROBNP in the last 8760 hours. HbA1C: No results for input(s): HGBA1C in the last 72 hours. CBG: Recent Labs  Lab 01/23/22 1721 01/23/22 2107 01/23/22 2202 01/24/22 0603 01/24/22 1137  GLUCAP 194* 46* 115* 176* 227*   Lipid Profile: No results for input(s): CHOL, HDL, LDLCALC, TRIG, CHOLHDL, LDLDIRECT in the last 72 hours. Thyroid Function Tests: Recent Labs    01/24/22 0209  TSH 1.374  FREET4 0.61   Anemia Panel: No results for input(s): VITAMINB12, FOLATE, FERRITIN, TIBC, IRON, RETICCTPCT in the last 72 hours. Urine  analysis:    Component Value Date/Time   COLORURINE YELLOW 01/24/2022 1024   APPEARANCEUR HAZY (A) 01/24/2022 1024   LABSPEC 1.024 01/24/2022 1024   PHURINE 5.0 01/24/2022 1024   GLUCOSEU 50 (A) 01/24/2022 1024   HGBUR NEGATIVE 01/24/2022 1024   BILIRUBINUR NEGATIVE 01/24/2022 1024   KETONESUR NEGATIVE 01/24/2022 1024   PROTEINUR >=300 (A) 01/24/2022 1024   UROBILINOGEN 0.2 01/29/2014 2116   NITRITE NEGATIVE 01/24/2022 1024   LEUKOCYTESUR NEGATIVE 01/24/2022 1024   No results found for this or any previous visit (from the past 240 hour(s)).   US RENAL  Result Date: 01/24/2022 CLINICAL DATA:  Acute kidney injury. EXAM: RENAL / URINARY TRACT ULTRASOUND COMPLETE COMPARISON:  04/01/2019. FINDINGS: Right Kidney: Renal measurements: 12.5 x 4.4 x 4.8 cm = volume: 139 mL. Echogenicity within normal limits. No mass or hydronephrosis visualized. Left Kidney: Renal measurements: 11.3 x 5.9 x 4.5 cm = volume: 157 mL. Echogenicity within normal limits. No mass or hydronephrosis visualized. Bladder: Appears normal for degree of bladder distention. Other: Incidental shadowing echogenic stone in the gallbladder measures  1.3 cm. IMPRESSION: 1. No acute findings. 2. Incidentally imaged cholelithiasis. Electronically Signed   By: Lorin Picket M.D.   On: 01/24/2022 11:04   CT Angio Chest Aorta W and/or Wo Contrast  Result Date: 01/22/2022 CLINICAL DATA:  Chest pain and shortness of breath. Possible acute aortic syndrome. EXAM: CT ANGIOGRAPHY CHEST WITH CONTRAST TECHNIQUE: Multidetector CT imaging of the chest was performed using the standard protocol during bolus administration of intravenous contrast. Multiplanar CT image reconstructions and MIPs were obtained to evaluate the vascular anatomy. RADIATION DOSE REDUCTION: This exam was performed according to the departmental dose-optimization program which includes automated exposure control, adjustment of the mA and/or kV according to patient size and/or use of  iterative reconstruction technique. CONTRAST:  8mL OMNIPAQUE IOHEXOL 350 MG/ML SOLN COMPARISON:  Chest radiograph 01/22/2022 FINDINGS: Cardiovascular: Contrast medium phase was timed for systemic arterial opacification. Contrast in the pulmonary arterial tree is somewhat dilute, and today's exam has a low sensitivity in assessing for pulmonary embolus. Atherosclerotic calcification of the aortic arch and descending thoracic aorta noted. Thickening of the interventricular septum at 2.0 cm suggesting left ventricular hypertrophy. No aortic dissection or acute thoracic aortic findings. Mild cardiomegaly. Mediastinum/Nodes: Solid 2.7 cm left thyroid nodule. My review of the imaging record does not indicate that this has been previously assessed sub-centimeter nodules in the isthmus and right thyroid lobe. Scattered mostly small mediastinal lymph nodes. Right paratracheal node anterior to the carina is borderline prominent at 1.0 cm in diameter on image 32 series 5. Lungs/Pleura: Mild reticular subpleural interstitial accentuation in the lungs. Upper Abdomen: Possible hepatic steatosis. 1.2 cm gallstone in the gallbladder on image 96 series 5. Musculoskeletal: Unremarkable Review of the MIP images confirms the above findings. IMPRESSION: 1. No acute aortic findings. 2. Mild cardiomegaly and left ventricular hypertrophy. 3. Solid 2.7 cm left thyroid nodule. Recommend thyroid US (ref: J Am Coll Radiol. 2015 Feb;12(2): 143-50). 4. Aortic Atherosclerosis (ICD10-I70.0). 5. Possible hepatic steatosis. 6. Cholelithiasis. 7. Mild subpleural reticular interstitial accentuation in the lungs, nonspecific. No substantial advancement compared to the appearance of the lung bases on 01/15/2020. Electronically Signed   By: Van Clines M.D.   On: 01/22/2022 15:20    Family Communication: None at bedside  Disposition: Status is: Inpatient Remains inpatient appropriate because: Monitoring of AKI with management of HTN  urgency. Planned Discharge Destination: Home      Patrecia Pour, MD 01/24/2022 12:06 PM Page by [email or web address]

## 2022-01-24 NOTE — Progress Notes (Signed)
Inpatient Diabetes Program Recommendations  AACE/ADA: New Consensus Statement on Inpatient Glycemic Control (2015)  Target Ranges:  Prepandial:   less than 140 mg/dL      Peak postprandial:   less than 180 mg/dL (1-2 hours)      Critically ill patients:  140 - 180 mg/dL   Lab Results  Component Value Date   GLUCAP 227 (H) 01/24/2022   HGBA1C 6.4 (H) 12/30/2021    Review of Glycemic Control  Latest Reference Range & Units 01/23/22 21:07 01/23/22 22:02 01/24/22 06:03 01/24/22 11:37  Glucose-Capillary 70 - 99 mg/dL 46 (L) 115 (H) 176 (H) 227 (H)  (L): Data is abnormally low (H): Data is abnormally high Diabetes history: Type 2 DM Outpatient Diabetes medications: Amaryl 4 mg BID, Lantus 30 units QD, Metformin 500 mg BID, Humalog 15 units TID Current orders for Inpatient glycemic control: Semglee 20 units QD, Novolog 0-15 units TID, Novolog 10 units TID  Inpatient Diabetes Program Recommendations:    In the setting of renal status, consider decreasing correction to Novolog 0-6 units TID and Novolog 7 units TID (assuming patient is consuming >50% of meal).   Thanks, Bronson Curb, MSN, RNC-OB Diabetes Coordinator [phone] (8a-5p)

## 2022-01-25 DIAGNOSIS — N179 Acute kidney failure, unspecified: Secondary | ICD-10-CM | POA: Diagnosis not present

## 2022-01-25 DIAGNOSIS — I160 Hypertensive urgency: Secondary | ICD-10-CM | POA: Diagnosis not present

## 2022-01-25 DIAGNOSIS — I5033 Acute on chronic diastolic (congestive) heart failure: Secondary | ICD-10-CM | POA: Diagnosis not present

## 2022-01-25 DIAGNOSIS — I2510 Atherosclerotic heart disease of native coronary artery without angina pectoris: Secondary | ICD-10-CM | POA: Diagnosis not present

## 2022-01-25 LAB — GLUCOSE, CAPILLARY
Glucose-Capillary: 137 mg/dL — ABNORMAL HIGH (ref 70–99)
Glucose-Capillary: 94 mg/dL (ref 70–99)
Glucose-Capillary: 96 mg/dL (ref 70–99)

## 2022-01-25 LAB — COMPREHENSIVE METABOLIC PANEL
ALT: 10 U/L (ref 0–44)
AST: 21 U/L (ref 15–41)
Albumin: 2.2 g/dL — ABNORMAL LOW (ref 3.5–5.0)
Alkaline Phosphatase: 94 U/L (ref 38–126)
Anion gap: 9 (ref 5–15)
BUN: 39 mg/dL — ABNORMAL HIGH (ref 6–20)
CO2: 20 mmol/L — ABNORMAL LOW (ref 22–32)
Calcium: 8 mg/dL — ABNORMAL LOW (ref 8.9–10.3)
Chloride: 103 mmol/L (ref 98–111)
Creatinine, Ser: 5 mg/dL — ABNORMAL HIGH (ref 0.44–1.00)
GFR, Estimated: 9 mL/min — ABNORMAL LOW (ref >60)
Glucose, Bld: 132 mg/dL — ABNORMAL HIGH (ref 70–99)
Potassium: 4.2 mmol/L (ref 3.5–5.1)
Sodium: 132 mmol/L — ABNORMAL LOW (ref 135–145)
Total Bilirubin: 0.6 mg/dL (ref 0.3–1.2)
Total Protein: 5.6 g/dL — ABNORMAL LOW (ref 6.5–8.1)

## 2022-01-25 LAB — CBC
HCT: 29 % — ABNORMAL LOW (ref 36.0–46.0)
Hemoglobin: 9.7 g/dL — ABNORMAL LOW (ref 12.0–15.0)
MCH: 30 pg (ref 26.0–34.0)
MCHC: 33.4 g/dL (ref 30.0–36.0)
MCV: 89.8 fL (ref 80.0–100.0)
Platelets: 180 10*3/uL (ref 150–400)
RBC: 3.23 MIL/uL — ABNORMAL LOW (ref 3.87–5.11)
RDW: 13.5 % (ref 11.5–15.5)
WBC: 5.4 10*3/uL (ref 4.0–10.5)
nRBC: 0 % (ref 0.0–0.2)

## 2022-01-25 MED ORDER — CALCIUM CARBONATE ANTACID 500 MG PO CHEW: 1.0000 | CHEWABLE_TABLET | Freq: Three times a day (TID) | ORAL | Status: DC | PRN

## 2022-01-25 NOTE — Progress Notes (Addendum)
  Transition of Care [facility]) Screening Note   Patient Details  Name: Carol [name] Date of Birth: November 07, 1960   Transition of Care [facility]) CM/SW Contact:    Dawayne Patricia, RN Phone Number: 01/25/2022, 10:48 AM    Transition of Care Department [facility]) has reviewed patient and We will continue to monitor patient advancement through interdisciplinary progression rounds. If patient transition needs arise, please place a TOC consult.

## 2022-01-25 NOTE — Progress Notes (Signed)
Progress Note  Patient: Carol [name] MAU:[id number] DOB: 07-28-1961  DOA: 01/22/2022  DOS: 01/25/2022    Brief hospital course: Malik Ruffino is a 61 y.o. female with a history of IDT2DM, uncontrolled HTN, cocaine use, stage IIIb CKD, CAD s/p recent NSTEMI managed medically who presented 6/5 with chest pain and dyspnea. She was cocaine positive, appeared volume overloaded with dependent edema. Troponin was 50. CXR suggested interstitial edema. CTA chest showed no aortic dissection, CT head nonacute. Cardiology was consulted and the patient was admitted on nitroglycerin infusion with hypertensive urgency. Mild troponin elevation is felt to be due to demand ischemia, and with improvement in HTN, symptoms have resolved. IV lasix was started but stopped the day after admission. Unfortunately, she has developed oliguric AKI, with creatinine trending upward. 1.7 > 2.4 > 4 > 5. Nephrology is consulted and supportive treatment ongoing.  Assessment and Plan: Uncontrolled HTN with HTN urgency: Precipitated by cocaine +/- incomplete medication adherence. BP significantly improved.  - Continue amlodipine 10 mg, hydralazine 50mg  po TID (home dose), can increase this if becomes severely hypertensive again.  - Holding nebivolol with +cocaine, would continue to avoid cardioselective beta blockers going forward. - Restarted imdur, stopped NTG gtt.  - Hold ARB/thiazide.   AKI on CKD IIIb: In setting of HTN urgency, ARB/thiazide use, cocaine use. Worsened with abrupt reduction in BP and after IV lasix and contrasted CTA chest.  - Appreciate nephrology folowing. - Renal U/S without hydro or increased echogenicity. FENa reassuringly < 1%, no casts on UA. Significant proteinuria w/UPr:Cr 6. - Continue to monitor UOP closely, improving  - NAGMA actually improved from admission and stable. Mild hyponatremia also noted, nothing to indicate urgent dialysis.  Acute on chronic HFpEF: CXR showing vascular  congestion and pulmonary edema.  BNP elevated to 618. - Holding lasix with AKI - HTN medications as above  Thyroid nodule: Seen on CTA. TSH and free T4 wnl.  - Needs U/S as outpatient, consideration of further FNA based on characteristics.   Demand myocardial ischemia due to cocaine intoxication/HTN urgency in pt with known CAD. Not consistent with ACS, plan outpatient follow up and risk factor modification. hx NSTEMI and had LHC of 5/3 showing 100% stenosis of a small sub-branch of the right PDA. Medical management with DAPT, beta blocker, and statin was recommended.  - Continue aspirin and plavix, statin, holding BB w/+cocaine.  Cocaine use: +UDS.  - Cessation counseling provided.  IDT2DM: HbA1c 6.4%.  -home regimen on 30 Units Lantus and 15 units Humalog TID -start with 20 units in the morning and 10units TID with moderate SSI. Caution titrating upward with AKI. Can use CGM if pt desires.  Anemia of CKD: Hgb down but has stabilized. - Change VTE ppx to heparin  HLD:  - Continue statin  Prolonged QT interval: QTc of 514 on EKG.   - Avoid QT prolongation medication.  GERD:  - PPI  Anxiety:  - prn atarax. Discussed prohibitive risk of benzodiazepines  Obesity: Estimated body mass index is 34.32 kg/m as calculated from the following:   Height as of this encounter: 5\' 4"  (1.626 m).   Weight as of this encounter: 90.7 kg.  Subjective: Would like Tx for anxiety, reflux symptoms flaring, getting to [facility] now instead of pure wick. Urine output objectively is better, though pt doesn't think she's urinating very much.  Objective: Vitals:   01/25/22 0518 01/25/22 0752 01/25/22 1055 01/25/22 1330  BP: 129/68 102/61 116/60 (!) 99/58  Pulse: 63 68  65   Resp: 17 18 19 17   Temp: 98.3 F (36.8 C) 98.5 F (36.9 C) 98.9 F (37.2 C)   TempSrc: Oral Oral Oral   SpO2: 98% 98% 98%   Weight: 90.7 kg     Height:      Gen: 61 y.o. female in no distress Pulm: Nonlabored breathing room air.  Clear. CV: Regular rate and rhythm. No murmur, rub, or gallop. No JVD, no dependent edema. GI: Abdomen soft, non-tender, non-distended, with normoactive bowel sounds.  Ext: Warm, no deformities Skin: No rashes, lesions or ulcers on visualized skin. Neuro: Alert and oriented. No focal neurological deficits. Psych: Judgement and insight appear fair. Mood euthymic & affect congruent. Behavior is appropriate.    Data Personally reviewed:  CBC: Recent Labs  Lab 01/22/22 0845 01/23/22 0537 01/24/22 0209 01/25/22 0057  WBC 6.3 4.0 4.5 5.4  HGB 11.3* 10.2* 9.4* 9.7*  HCT 34.4* 30.4* 27.9* 29.0*  MCV 89.8 88.1 89.1 89.8  PLT 214 193 156 161   Basic Metabolic Panel: Recent Labs  Lab 01/22/22 0845 01/23/22 0537 01/24/22 0209 01/25/22 0057  NA 140 140 133* 132*  K 3.6 3.7 3.6 4.2  CL 110 111 102 103  CO2 16* 20* 20* 20*  GLUCOSE 132* 156* 119* 132*  BUN 10 17 27* 39*  CREATININE 1.79* 2.48* 4.00* 5.00*  CALCIUM 8.9 8.5* 8.0* 8.0*   TSH: 1.374 Free T4: 0.61 UPr:Cr 6.06, FENa 0.82% UA: >300 protein, 50 glucose, 11-20 WBCs, no RBCs.  Renal U/S 6/7: Normal echogenicity. 1. No acute findings. 2. Incidentally imaged cholelithiasis.    CTA chest 6/5:  1. No acute aortic findings. 2. Mild cardiomegaly and left ventricular hypertrophy. 3. Solid 2.7 cm left thyroid nodule. Recommend thyroid US (ref: J Am Coll Radiol. 2015 Feb;12(2): 143-50). 4. Aortic Atherosclerosis (ICD10-I70.0). 5. Possible hepatic steatosis. 6. Cholelithiasis. 7. Mild subpleural reticular interstitial accentuation in the lungs, nonspecific. No substantial advancement compared to the appearance of the lung bases on 01/15/2020.  Family Communication: None at bedside  Disposition: Status is: Inpatient Remains inpatient appropriate because: Monitoring of AKI with management of HTN urgency. Planned Discharge Destination: Home   Patrecia Pour, MD 01/25/2022 3:11 PM Page by [email or web address]

## 2022-01-25 NOTE — Progress Notes (Signed)
Patient ID: Charlaine Utsey, female   DOB: March 02, 1961, 61 y.o.   MRN: [id number] S: No new complaints O:BP 102/61 (BP Location: Right Arm)   Pulse 68   Temp 98.5 F (36.9 C) (Oral)   Resp 18   Ht 5\' 4"  (1.626 m)   Wt 90.7 kg   SpO2 98%   BMI 34.32 kg/m   Intake/Output Summary (Last 24 hours) at 01/25/2022 1015 Last data filed at 01/25/2022 0555 Gross per 24 hour  Intake 930 ml  Output 250 ml  Net 680 ml   Intake/Output: I/O last 3 completed shifts: In: 1180 [P.O.:1180] Out: 700 [Urine:600; Stool:100]  Intake/Output this shift:  No intake/output data recorded. Weight change: 4.789 kg Gen:NAD CVS: RRR Resp:CTA Abd: +BS, soft, NT/ND Ext: minimal pretibial edema  Recent Labs  Lab 01/22/22 0845 01/23/22 0537 01/24/22 0209 01/25/22 0057  NA 140 140 133* 132*  K 3.6 3.7 3.6 4.2  CL 110 111 102 103  CO2 16* 20* 20* 20*  GLUCOSE 132* 156* 119* 132*  BUN 10 17 27* 39*  CREATININE 1.79* 2.48* 4.00* 5.00*  ALBUMIN 2.7*  --   --  2.2*  CALCIUM 8.9 8.5* 8.0* 8.0*  AST 31  --   --  21  ALT 13  --   --  10   Liver Function Tests: Recent Labs  Lab 01/22/22 0845 01/25/22 0057  AST 31 21  ALT 13 10  ALKPHOS 123 94  BILITOT 0.4 0.6  PROT 6.8 5.6*  ALBUMIN 2.7* 2.2*   No results for input(s): "LIPASE", "AMYLASE" in the last 168 hours. No results for input(s): "AMMONIA" in the last 168 hours. CBC: Recent Labs  Lab 01/22/22 0845 01/23/22 0537 01/24/22 0209 01/25/22 0057  WBC 6.3 4.0 4.5 5.4  HGB 11.3* 10.2* 9.4* 9.7*  HCT 34.4* 30.4* 27.9* 29.0*  MCV 89.8 88.1 89.1 89.8  PLT 214 193 156 180   Cardiac Enzymes: No results for input(s): "CKTOTAL", "CKMB", "CKMBINDEX", "TROPONINI" in the last 168 hours. CBG: Recent Labs  Lab 01/24/22 0603 01/24/22 1137 01/24/22 1656 01/24/22 2120 01/25/22 0613  GLUCAP 176* 227* 181* 149* 137*    Iron Studies: No results for input(s): "IRON", "TIBC", "TRANSFERRIN", "FERRITIN" in the last 72 hours. Studies/Results: US  RENAL  Result Date: 01/24/2022 CLINICAL DATA:  Acute kidney injury. EXAM: RENAL / URINARY TRACT ULTRASOUND COMPLETE COMPARISON:  04/01/2019. FINDINGS: Right Kidney: Renal measurements: 12.5 x 4.4 x 4.8 cm = volume: 139 mL. Echogenicity within normal limits. No mass or hydronephrosis visualized. Left Kidney: Renal measurements: 11.3 x 5.9 x 4.5 cm = volume: 157 mL. Echogenicity within normal limits. No mass or hydronephrosis visualized. Bladder: Appears normal for degree of bladder distention. Other: Incidental shadowing echogenic stone in the gallbladder measures 1.3 cm. IMPRESSION: 1. No acute findings. 2. Incidentally imaged cholelithiasis. Electronically Signed   By: Lorin Picket M.D.   On: 01/24/2022 11:04    amLODipine  10 mg Oral q morning   aspirin EC  81 mg Oral Daily   atorvastatin  80 mg Oral q morning   clopidogrel  75 mg Oral q morning   gabapentin  100 mg Oral q morning   heparin injection (subcutaneous)  5,000 Units Subcutaneous Q8H   hydrALAZINE  50 mg Oral Q8H   insulin aspart  0-15 Units Subcutaneous TID WC   insulin aspart  10 Units Subcutaneous TID WC   insulin glargine-yfgn  20 Units Subcutaneous Daily   loratadine  10 mg Oral Daily  mometasone-formoterol  2 puff Inhalation BID   nebivolol  20 mg Oral Daily   pantoprazole  40 mg Oral q morning   QUEtiapine  300 mg Oral QHS   sertraline  50 mg Oral QPM   Vitamin D (Ergocalciferol)  50,000 Units Oral Q Mon    BMET    Component Value Date/Time   NA 132 (L) 01/25/2022 0057   K 4.2 01/25/2022 0057   CL 103 01/25/2022 0057   CO2 20 (L) 01/25/2022 0057   GLUCOSE 132 (H) 01/25/2022 0057   BUN 39 (H) 01/25/2022 0057   CREATININE 5.00 (H) 01/25/2022 0057   CREATININE 1.36 (H) 11/21/2020 1451   CALCIUM 8.0 (L) 01/25/2022 0057   GFRNONAA 9 (L) 01/25/2022 0057   GFRNONAA 43 (L) 11/21/2020 1451   GFRAA 50 (L) 11/21/2020 1451   CBC    Component Value Date/Time   WBC 5.4 01/25/2022 0057   RBC 3.23 (L) 01/25/2022 0057    HGB 9.7 (L) 01/25/2022 0057   HCT 29.0 (L) 01/25/2022 0057   PLT 180 01/25/2022 0057   MCV 89.8 01/25/2022 0057   MCH 30.0 01/25/2022 0057   MCHC 33.4 01/25/2022 0057   RDW 13.5 01/25/2022 0057   LYMPHSABS 1.1 12/30/2021 2342   MONOABS 0.6 12/30/2021 2342   EOSABS 0.1 12/30/2021 2342   BASOSABS 0.0 12/30/2021 2342      Assessment/Plan:  AKI/CKD stage IIIb - multiple renal insults including cocaine, hypertensive urgency with rapid over correction and hypotension with concomitant ARB, as well as CIN from IV contrast +/- NSAIDs.  UOP picking up but BUN/Cr continue to climb.  She has no uremic signs or symptoms.  Continue with current BP regimen.  Continue to hold ARB, HCTZ, and metformin.  No indication for dialysis at this time.  Will continue to follow closely.  Hopefully with increase in UOP will see BUN/Cr reach a plateau. HTN urgency - improved.  Continue with amlodipine and hydralazine.  Nebivolol on hold due to cocaine +, ARB/HCTZ on hold due to AKI. Acute on chronic diastolic CHF - improved.  Lasix on hold due to AKI. Anemia of CKD stage IIIb - follow H/H and transfuse prn.  Will check iron stores.  Hold off on ESA for now. CAD - with demand ischemia.  Improved. Cocaine abuse - we discussed the risks to her health with ongoing use.  She reports that she had been clean for 20 years until this April.  Is willing to stop and move out of her current situation.  DM type 2- per primary.  Donetta Potts, MD [facility]

## 2022-01-25 NOTE — Care Management Important Message (Signed)
Important Message  Patient Details  Name: Carol [name] MRN: [id number] Date of Birth: 1961/02/12   Medicare Important Message Given:  Yes     Orbie Pyo 01/25/2022, 4:34 PM

## 2022-01-25 NOTE — Progress Notes (Addendum)
Progress Note  Patient Name: Carol [name] Date of Encounter: 01/25/2022  [facility] Cardiologist: Janina Mayo, MD   Subjective   Carol [name] feels better, no changes  Inpatient Medications    Scheduled Meds:  amLODipine  10 mg Oral q morning   aspirin EC  81 mg Oral Daily   atorvastatin  80 mg Oral q morning   clopidogrel  75 mg Oral q morning   gabapentin  100 mg Oral q morning   heparin injection (subcutaneous)  5,000 Units Subcutaneous Q8H   hydrALAZINE  50 mg Oral Q8H   insulin aspart  0-15 Units Subcutaneous TID WC   insulin aspart  10 Units Subcutaneous TID WC   insulin glargine-yfgn  20 Units Subcutaneous Daily   loratadine  10 mg Oral Daily   mometasone-formoterol  2 puff Inhalation BID   nebivolol  20 mg Oral Daily   pantoprazole  40 mg Oral q morning   QUEtiapine  300 mg Oral QHS   sertraline  50 mg Oral QPM   Vitamin D (Ergocalciferol)  50,000 Units Oral Q Mon   Continuous Infusions:   PRN Meds: albuterol, cyclobenzaprine, hydrOXYzine, LORazepam, oxyCODONE-acetaminophen   Vital Signs    Vitals:   01/24/22 2334 01/25/22 0400 01/25/22 0518 01/25/22 0752  BP: 100/67  129/68 102/61  Pulse: 65  63 68  Resp: 17 20 17 18   Temp: 99.3 F (37.4 C)  98.3 F (36.8 C) 98.5 F (36.9 C)  TempSrc: Oral  Oral Oral  SpO2: 97%  98% 98%  Weight:   90.7 kg   Height:        Intake/Output Summary (Last 24 hours) at 01/25/2022 0828 Last data filed at 01/25/2022 0555 Gross per 24 hour  Intake 930 ml  Output 700 ml  Net 230 ml      01/25/2022    5:18 AM 01/24/2022    4:08 AM 01/23/2022    4:29 AM  Last 3 Weights  Weight (lbs) 199 lb 15.3 oz 189 lb 6.4 oz 193 lb 12.6 oz  Weight (kg) 90.7 kg 85.911 kg 87.9 kg      Telemetry    NSR - Personally Reviewed  ECG    NA - Personally Reviewed  Physical Exam   Vitals:   01/25/22 0518 01/25/22 0752  BP: 129/68 102/61  Pulse: 63 68  Resp: 17 18  Temp: 98.3 F (36.8 C) 98.5 F (36.9 C)  SpO2:  98% 98%    GEN: No acute distress.   Neck: no sig JVD Cardiac: RRR, no murmurs, rubs, or gallops.  Respiratory: Clear to auscultation bilaterally. GI: Soft, nontender, non-distended  MS: No edema; No deformity. Neuro:  Nonfocal  Psych: Normal affect   Labs    High Sensitivity Troponin:   Recent Labs  Lab 01/10/22 0946 01/10/22 1226 01/22/22 0845 01/22/22 1348 01/22/22 1724  TROPONINIHS 39* 31* 50* 53* 40*     Chemistry Recent Labs  Lab 01/22/22 0845 01/23/22 0537 01/24/22 0209 01/25/22 0057  NA 140 140 133* 132*  K 3.6 3.7 3.6 4.2  CL 110 111 102 103  CO2 16* 20* 20* 20*  GLUCOSE 132* 156* 119* 132*  BUN 10 17 27* 39*  CREATININE 1.79* 2.48* 4.00* 5.00*  CALCIUM 8.9 8.5* 8.0* 8.0*  PROT 6.8  --   --  5.6*  ALBUMIN 2.7*  --   --  2.2*  AST 31  --   --  21  ALT 13  --   --  10  ALKPHOS 123  --   --  94  BILITOT 0.4  --   --  0.6  GFRNONAA 32* 22* 12* 9*  ANIONGAP 14 9 11 9     Lipids No results for input(s): "CHOL", "TRIG", "HDL", "LABVLDL", "LDLCALC", "CHOLHDL" in the last 168 hours.  Hematology Recent Labs  Lab 01/23/22 0537 01/24/22 0209 01/25/22 0057  WBC 4.0 4.5 5.4  RBC 3.45* 3.13* 3.23*  HGB 10.2* 9.4* 9.7*  HCT 30.4* 27.9* 29.0*  MCV 88.1 89.1 89.8  MCH 29.6 30.0 30.0  MCHC 33.6 33.7 33.4  RDW 13.2 13.5 13.5  PLT 193 156 180   Thyroid  Recent Labs  Lab 01/24/22 0209  TSH 1.374  FREET4 0.61    BNP Recent Labs  Lab 01/22/22 0845  BNP 618.4*    DDimer No results for input(s): "DDIMER" in the last 168 hours.   Radiology    US RENAL  Result Date: 01/24/2022 CLINICAL DATA:  Acute kidney injury. EXAM: RENAL / URINARY TRACT ULTRASOUND COMPLETE COMPARISON:  04/01/2019. FINDINGS: Right Kidney: Renal measurements: 12.5 x 4.4 x 4.8 cm = volume: 139 mL. Echogenicity within normal limits. No mass or hydronephrosis visualized. Left Kidney: Renal measurements: 11.3 x 5.9 x 4.5 cm = volume: 157 mL. Echogenicity within normal limits. No mass or  hydronephrosis visualized. Bladder: Appears normal for degree of bladder distention. Other: Incidental shadowing echogenic stone in the gallbladder measures 1.3 cm. IMPRESSION: 1. No acute findings. 2. Incidentally imaged cholelithiasis. Electronically Signed   By: Lorin Picket M.D.   On: 01/24/2022 11:04    Cardiac Studies   TTE 12/20/2021  1. Left ventricular ejection fraction, by estimation, is 60 to 65%. The  left ventricle has normal function. The left ventricle has no regional  wall motion abnormalities. There is mild left ventricular hypertrophy.  Left ventricular diastolic parameters  are consistent with Grade I diastolic dysfunction (impaired relaxation).   2. Right ventricular systolic function is normal. The right ventricular  size is normal.   3. Left atrial size was mildly dilated.   4. The mitral valve is normal in structure. Trivial mitral valve  regurgitation. No evidence of mitral stenosis.   5. The aortic valve is normal in structure. Aortic valve regurgitation is  not visualized. No aortic stenosis is present.   6. The inferior vena cava is normal in size with greater than 50%  respiratory variability, suggesting right atrial pressure of 3 mmHg.  12/20/2021    Inf Sept lesion is 100% stenosed.   Mid RCA lesion is 20% stenosed.   Mid LAD lesion is 20% stenosed.   NSTEMI with culprit vessel felt to be a very small sub-Carol [name] off of the right PDA. This is a 1.5 mm vessel and supplies a small portion of myocardium.  Mild disease in the mid LAD and mid RCA LVEDP=15 mmHg   Recommendations: Medical management of CAD. I will start Plavix and continue ASA, beta blocker and statin.   Patient Profile     Carol [name] is a 61 y.o. female with a hx of hypertension, HLD, type 2 DM, remote cocaine use planning to live with family and work on recovery, She had LHC 5/3 that showed sub Carol [name] off of the right PDA. Medical management was recommended, who is being seen  initially on 01/22/2022 for the evaluation of chest pain and SOB at the request of Dr. Jeanell Sparrow.  Assessment & Plan    #Hypertensive emergency: in the setting of cocaine use.  Blood pressures still elevated. Will be more aggressive with her blood pressure. - continue home nebivolol 20 mg daily - continue norvasc 10 mg daily - continue hydralazine 50 mg TID - stop home  ARB; can't do spironolactone with renal function.   #Ischemic Heart Disease: small sub Carol [name] off the R PDA disease. Continue aspirin 81 mg daily and plavix 75 mg daily. Continue lipitor 80 mg daily  #Diastolic CHF: in the setting of cocaine use. Her BNP was elevated to 600s and she was c/o SOB. She is not floridly volume overloaded. Would hold diuretic still  #AKI: Crt 1.79 -> 2.48->4.0.  in the setting of hypertensive emergency, post contrast and cocaine use. Continuing to increase. Per  nephrology  #Substance abuse: she plans to live with her daughter for awhile for support and work on abstinence of cocaine.  I'll see her on 02/06/2022. Please order BMET in 1 week. Cardiology will sign off   For questions or updates, please contact [place] Please consult [email or web address] for contact info under       Signed, Janina Mayo, MD  01/25/2022, 8:28 AM

## 2022-01-26 ENCOUNTER — Inpatient Hospital Stay (HOSPITAL_COMMUNITY): Payer: Medicare Other

## 2022-01-26 DIAGNOSIS — I5033 Acute on chronic diastolic (congestive) heart failure: Secondary | ICD-10-CM | POA: Diagnosis not present

## 2022-01-26 DIAGNOSIS — I2510 Atherosclerotic heart disease of native coronary artery without angina pectoris: Secondary | ICD-10-CM | POA: Diagnosis not present

## 2022-01-26 DIAGNOSIS — I160 Hypertensive urgency: Secondary | ICD-10-CM | POA: Diagnosis not present

## 2022-01-26 DIAGNOSIS — N179 Acute kidney failure, unspecified: Secondary | ICD-10-CM | POA: Diagnosis not present

## 2022-01-26 HISTORY — PX: IR FLUORO GUIDE CV LINE RIGHT: IMG2283

## 2022-01-26 HISTORY — PX: IR US GUIDE VASC ACCESS RIGHT: IMG2390

## 2022-01-26 LAB — GLUCOSE, CAPILLARY
Glucose-Capillary: 123 mg/dL — ABNORMAL HIGH (ref 70–99)
Glucose-Capillary: 155 mg/dL — ABNORMAL HIGH (ref 70–99)
Glucose-Capillary: 172 mg/dL — ABNORMAL HIGH (ref 70–99)
Glucose-Capillary: 172 mg/dL — ABNORMAL HIGH (ref 70–99)
Glucose-Capillary: 192 mg/dL — ABNORMAL HIGH (ref 70–99)

## 2022-01-26 LAB — RENAL FUNCTION PANEL
Albumin: 2.4 g/dL — ABNORMAL LOW (ref 3.5–5.0)
Anion gap: 10 (ref 5–15)
BUN: 46 mg/dL — ABNORMAL HIGH (ref 6–20)
CO2: 18 mmol/L — ABNORMAL LOW (ref 22–32)
Calcium: 8.1 mg/dL — ABNORMAL LOW (ref 8.9–10.3)
Chloride: 101 mmol/L (ref 98–111)
Creatinine, Ser: 6.32 mg/dL — ABNORMAL HIGH (ref 0.44–1.00)
GFR, Estimated: 7 mL/min — ABNORMAL LOW (ref >60)
Glucose, Bld: 156 mg/dL — ABNORMAL HIGH (ref 70–99)
Phosphorus: 4.7 mg/dL — ABNORMAL HIGH (ref 2.5–4.6)
Potassium: 4.9 mmol/L (ref 3.5–5.1)
Sodium: 129 mmol/L — ABNORMAL LOW (ref 135–145)

## 2022-01-26 LAB — HEPATITIS B SURFACE ANTIGEN: Hepatitis B Surface Ag: NONREACTIVE

## 2022-01-26 LAB — HEPATITIS B CORE ANTIBODY, TOTAL: Hep B Core Total Ab: NONREACTIVE

## 2022-01-26 LAB — HEPATITIS C ANTIBODY: HCV Ab: NONREACTIVE

## 2022-01-26 LAB — HEPATITIS B SURFACE ANTIBODY,QUALITATIVE: Hep B S Ab: NONREACTIVE

## 2022-01-26 MED ORDER — CHLORHEXIDINE GLUCONATE CLOTH 2 % EX PADS
6.0000 | MEDICATED_PAD | Freq: Every day | CUTANEOUS | Status: DC
  Administered 2022-01-27 – 2022-02-02 (×7): 6 via TOPICAL

## 2022-01-26 MED ORDER — HEPARIN SODIUM (PORCINE) 1000 UNIT/ML IJ SOLN
INTRAMUSCULAR | Status: AC
  Filled 2022-01-26: qty 10

## 2022-01-26 MED ORDER — LIDOCAINE HCL 1 % IJ SOLN
INTRAMUSCULAR | Status: AC
  Filled 2022-01-26: qty 20

## 2022-01-26 MED ORDER — FUROSEMIDE 10 MG/ML IJ SOLN
80.0000 mg | Freq: Once | INTRAMUSCULAR | Status: AC
  Administered 2022-01-26: 80 mg via INTRAVENOUS
  Filled 2022-01-26: qty 8

## 2022-01-26 MED ORDER — HEPARIN SODIUM (PORCINE) 5000 UNIT/ML IJ SOLN
5000.0000 [IU] | Freq: Three times a day (TID) | INTRAMUSCULAR | Status: DC
Start: 2022-01-27 — End: 2022-02-04
  Administered 2022-01-27 – 2022-02-03 (×22): 5000 [IU] via SUBCUTANEOUS
  Filled 2022-01-26 (×22): qty 1

## 2022-01-26 MED ORDER — LIDOCAINE HCL (PF) 1 % IJ SOLN
INTRAMUSCULAR | Status: DC | PRN
  Administered 2022-01-26: 5 mL

## 2022-01-26 NOTE — Progress Notes (Signed)
Physical Therapy Treatment Patient Details Name: Carol [name] MRN: [id number] DOB: 12/06/1960 Today's Date: 01/26/2022   History of Present Illness Patient is a 61 y/o female who presents on 01/22/22 with chest pain, SOB (in setting of cocaine withdrawal) and heart palpitations. Workup for HTN emergency and acute on chronic CHF. CXR-pulmonary edema. S/p RIJ temp HD cath placement 6/9. PMH includes anxiety, chronic back pain, depression, DM, HTN, PD, NSTEMI, cocaine use; of note, recent admission 5/13-5/17/23 for hypoglycemia, elevated BP and 5/3-12/24/21 for NSTEMI.   PT Comments    Pt progressing with mobility; mod indep with transfers and ambulation with RW, notes improvement in DOE. Pt demonstrates improving strength and activity tolerance; HEP handout provided for therex. Pt has met short-term acute PT goals; pt reports no further questions or concerns. Encouraged more frequent OOB activity. Will d/c acute PT.    Recommendations for follow up therapy are one component of a multi-disciplinary discharge planning process, led by the attending physician.  Recommendations may be updated based on patient status, additional functional criteria and insurance authorization.  Follow Up Recommendations  No PT follow up (HH aide)     Assistance Recommended at Discharge Intermittent Supervision/Assistance  Patient can return home with the following A little help with bathing/dressing/bathroom;Assistance with cooking/housework;Help with stairs or ramp for entrance;Assist for transportation   Equipment Recommendations  None recommended by PT    Recommendations for Other Services       Precautions / Restrictions Precautions Precautions: Fall Restrictions Weight Bearing Restrictions: No     Mobility  Bed Mobility Overal bed mobility: Independent                  Transfers Overall transfer level: Modified independent Equipment used: Rolling walker (2 wheels)                     Ambulation/Gait Ambulation/Gait assistance: Modified independent (Device/Increase time) Gait Distance (Feet): 180 Feet Assistive device: Rolling walker (2 wheels) Gait Pattern/deviations: Step-through pattern, Decreased stride length Gait velocity: Decreased     General Gait Details: slow, steady gait mod indep with RW; declines further distance secondary to fatigue. Did not note DOE, but pt endorses SOB; SpO2 97% on RA, HR 67   Stairs Stairs:  (pt declines, reports no stairs at home)           Wheelchair Mobility    Modified Rankin (Stroke Patients Only)       Balance Overall balance assessment: History of Falls, Modified Independent Sitting-balance support: Feet supported, No upper extremity supported Sitting balance-Leahy Scale: Good     Standing balance support: During functional activity, No upper extremity supported Standing balance-Leahy Scale: Fair                              Cognition Arousal/Alertness: Awake/alert Behavior During Therapy: WFL for tasks assessed/performed Overall Cognitive Status: Within Functional Limits for tasks assessed                                          Exercises Other Exercises Other Exercises: [employer] HEP handout (Access Code [email or web address]) provided - repeated sit<>stands, standing calf raise (UE support), partial squats (UE support), SLR, supine bridge    General Comments        Pertinent Vitals/Pain Pain Assessment Pain Assessment: No/denies pain Pain Intervention(s): Monitored during   session    Home Living                          Prior Function            PT Goals (current goals can now be found in the care plan section) Progress towards PT goals: Goals met/education completed, patient discharged from PT    Frequency    Min 3X/week      PT Plan Current plan remains appropriate    Co-evaluation              AM-PAC PT "6 Clicks" Mobility    Outcome Measure  Help needed turning from your back to your side while in a flat bed without using bedrails?: None Help needed moving from lying on your back to sitting on the side of a flat bed without using bedrails?: None Help needed moving to and from a bed to a chair (including a wheelchair)?: None Help needed standing up from a chair using your arms (e.g., wheelchair or bedside chair)?: None Help needed to walk in hospital room?: None Help needed climbing 3-5 steps with a railing? : A Little 6 Click Score: 23    End of Session   Activity Tolerance: Patient tolerated treatment well Patient left: in bed;with call bell/phone within reach;with nursing/sitter in room Nurse Communication: Mobility status;Other (comment) (pt can ambulate indep) PT Visit Diagnosis: Muscle weakness (generalized) (M62.81);Unsteadiness on feet (R26.81);Other (comment)     Time: 1513-1530 PT Time Calculation (min) (ACUTE ONLY): 17 min  Charges:  $Therapeutic Exercise: 8-22 mins                      , PT, DPT Acute Rehabilitation Services  Pager [phone] Office [phone]   L  01/26/2022, 4:43 PM  

## 2022-01-26 NOTE — Progress Notes (Signed)
Occupational Therapy Treatment Patient Details Name: Carol [name] MRN: [id number] DOB: September 08, 1960 Today's Date: 01/26/2022   History of present illness Patient is a 61 y/o female who presents on 01/22/22 with chest pain, SOB (in setting of cocaine withdrawal) and heart palpitations. Admitted with HTN emergency and acute on chronic CHF. CXR-pulmonary edema. Recently admitted 5/13-5/17 for hypoglycemia, elevated BP and 5/3-5/7 for NSTEMI and had LHC of 5/3 showing 100% stenosis of a small sub-branch of the right PDA. PMH includes anxiety, chronic back pain, depression, DM, HTN, PD, NSTEMI, cocaine use.   OT comments  Patient received in supine and agreeable to OT treatment. Patient able to get to EOB without assistance and ambulated to sink for self care. Patient able to stand at sink to perform grooming and bathing tasks with supervision to min guard assist for safety and did not require seated rest break. Patient ambulated to recliner and was able to doff and donn socks with supervision. Patient is making good progress and will continue to be followed by Acute OT.    Recommendations for follow up therapy are one component of a multi-disciplinary discharge planning process, led by the attending physician.  Recommendations may be updated based on patient status, additional functional criteria and insurance authorization.    Follow Up Recommendations  Home health OT    Assistance Recommended at Discharge Set up Supervision/Assistance  Patient can return home with the following  A little help with bathing/dressing/bathroom;Assistance with cooking/housework;Help with stairs or ramp for entrance;Assist for transportation   Equipment Recommendations  BSC/3in1    Recommendations for Other Services      Precautions / Restrictions Precautions Precautions: Fall;Other (comment) Precaution Comments: watch BP Restrictions Weight Bearing Restrictions: No       Mobility Bed Mobility Overal  bed mobility: Needs Assistance Bed Mobility: Supine to Sit     Supine to sit: Supervision     General bed mobility comments: able to get to EOB without assistance    Transfers Overall transfer level: Needs assistance Equipment used: Rolling walker (2 wheels) Transfers: Sit to/from Stand Sit to Stand: Min guard           General transfer comment: min guard during mobility     Balance Overall balance assessment: Needs assistance, History of Falls Sitting-balance support: Feet supported, No upper extremity supported Sitting balance-Leahy Scale: Good     Standing balance support: During functional activity Standing balance-Leahy Scale: Fair Standing balance comment: able to perform self care tasks standing at sink without UE support                           ADL either performed or assessed with clinical judgement   ADL Overall ADL's : Needs assistance/impaired     Grooming: Wash/dry hands;Wash/dry face;Oral care;Supervision/safety;Standing Grooming Details (indicate cue type and reason): standing at sink Upper Body Bathing: Supervision/ safety;Standing Upper Body Bathing Details (indicate cue type and reason): at sink Lower Body Bathing: Min guard;Sit to/from stand Lower Body Bathing Details (indicate cue type and reason): at sink     Lower Body Dressing: Supervision/safety;Sitting/lateral leans Lower Body Dressing Details (indicate cue type and reason): able to doff and donn socks Toilet Transfer: Min guard;Ambulation;Rolling walker (2 wheels) Toilet Transfer Details (indicate cue type and reason): simulated in room           General ADL Comments: patient supervision to min guard for self care    Extremity/Trunk Assessment  Vision       Perception     Praxis      Cognition Arousal/Alertness: Awake/alert Behavior During Therapy: WFL for tasks assessed/performed Overall Cognitive Status: Within Functional Limits for tasks  assessed                                 General Comments: for basic mobility and self care        Exercises      Shoulder Instructions       General Comments      Pertinent Vitals/ Pain       Pain Assessment Pain Assessment: Faces Faces Pain Scale: Hurts a little bit Pain Location: back Pain Descriptors / Indicators: Discomfort Pain Intervention(s): Monitored during session, Repositioned  Home Living                                          Prior Functioning/Environment              Frequency  Min 2X/week        Progress Toward Goals  OT Goals(current goals can now be found in the care plan section)  Progress towards OT goals: Progressing toward goals  Acute Rehab OT Goals Patient Stated Goal: get better OT Goal Formulation: With patient Time For Goal Achievement: 02/07/22 Potential to Achieve Goals: Good ADL Goals Pt Will Perform Upper Body Dressing: with modified independence;standing;sitting Pt Will Perform Lower Body Dressing: with modified independence;sitting/lateral leans;sit to/from stand Pt Will Perform Tub/Shower Transfer: Shower transfer;rolling walker;ambulating;3 in 1  Plan Discharge plan remains appropriate    Co-evaluation                 AM-PAC OT "6 Clicks" Daily Activity     Outcome Measure   Help from another person eating meals?: None Help from another person taking care of personal grooming?: None Help from another person toileting, which includes using toliet, bedpan, or urinal?: None Help from another person bathing (including washing, rinsing, drying)?: A Little Help from another person to put on and taking off regular upper body clothing?: None Help from another person to put on and taking off regular lower body clothing?: A Little 6 Click Score: 22    End of Session Equipment Utilized During Treatment: Rolling walker (2 wheels)  OT Visit Diagnosis: Unsteadiness on feet  (R26.81);Other abnormalities of gait and mobility (R26.89);Muscle weakness (generalized) (M62.81)   Activity Tolerance Patient tolerated treatment well   Patient Left in chair;with call bell/phone within reach   Nurse Communication Mobility status        Time: 5277-8242 OT Time Calculation (min): 20 min  Charges: OT General Charges $OT Visit: 1 Visit OT Treatments $Self Care/Home Management : 8-22 mins  Lodema Hong, [place]  Pager [phone] Office [place] 01/26/2022, 9:04 AM

## 2022-01-26 NOTE — Progress Notes (Signed)
Patient ID: Carol [name], female   DOB: Mar 25, 1961, 61 y.o.   MRN: [id number] S: Not feeling well this morning.  Having some SOB but denies any N/V.  No urine output over the last 24 hours. O:BP 107/65 (BP Location: Right Arm)   Pulse 64   Temp 98.6 F (37 C) (Oral)   Resp 18   Ht 5\' 4"  (1.626 m)   Wt 89 kg   SpO2 99%   BMI 33.68 kg/m   Intake/Output Summary (Last 24 hours) at 01/26/2022 1025 Last data filed at 01/26/2022 0351 Gross per 24 hour  Intake --  Output 0 ml  Net 0 ml   Intake/Output: I/O last 3 completed shifts: In: 450 [P.O.:450] Out: 0   Intake/Output this shift:  No intake/output data recorded. Weight change: -1.7 kg Gen:NAD CVS: RRR, no rub Resp:  occ rhonchi Abd:+BS, soft, NT/ND Ext:1+ edema bilateral lower extremities  Recent Labs  Lab 01/22/22 0845 01/23/22 0537 01/24/22 0209 01/25/22 0057 01/26/22 0255  NA 140 140 133* 132* 129*  K 3.6 3.7 3.6 4.2 4.9  CL 110 111 102 103 101  CO2 16* 20* 20* 20* 18*  GLUCOSE 132* 156* 119* 132* 156*  BUN 10 17 27* 39* 46*  CREATININE 1.79* 2.48* 4.00* 5.00* 6.32*  ALBUMIN 2.7*  --   --  2.2* 2.4*  CALCIUM 8.9 8.5* 8.0* 8.0* 8.1*  PHOS  --   --   --   --  4.7*  AST 31  --   --  21  --   ALT 13  --   --  10  --    Liver Function Tests: Recent Labs  Lab 01/22/22 0845 01/25/22 0057 01/26/22 0255  AST 31 21  --   ALT 13 10  --   ALKPHOS 123 94  --   BILITOT 0.4 0.6  --   PROT 6.8 5.6*  --   ALBUMIN 2.7* 2.2* 2.4*   No results for input(s): "LIPASE", "AMYLASE" in the last 168 hours. No results for input(s): "AMMONIA" in the last 168 hours. CBC: Recent Labs  Lab 01/22/22 0845 01/23/22 0537 01/24/22 0209 01/25/22 0057  WBC 6.3 4.0 4.5 5.4  HGB 11.3* 10.2* 9.4* 9.7*  HCT 34.4* 30.4* 27.9* 29.0*  MCV 89.8 88.1 89.1 89.8  PLT 214 193 156 180   Cardiac Enzymes: No results for input(s): "CKTOTAL", "CKMB", "CKMBINDEX", "TROPONINI" in the last 168 hours. CBG: Recent Labs  Lab 01/25/22 0613  01/25/22 1058 01/25/22 1553 01/26/22 0003 01/26/22 0607  GLUCAP 137* 94 96 172* 155*    Iron Studies: No results for input(s): "IRON", "TIBC", "TRANSFERRIN", "FERRITIN" in the last 72 hours. Studies/Results: US RENAL  Result Date: 01/24/2022 CLINICAL DATA:  Acute kidney injury. EXAM: RENAL / URINARY TRACT ULTRASOUND COMPLETE COMPARISON:  04/01/2019. FINDINGS: Right Kidney: Renal measurements: 12.5 x 4.4 x 4.8 cm = volume: 139 mL. Echogenicity within normal limits. No mass or hydronephrosis visualized. Left Kidney: Renal measurements: 11.3 x 5.9 x 4.5 cm = volume: 157 mL. Echogenicity within normal limits. No mass or hydronephrosis visualized. Bladder: Appears normal for degree of bladder distention. Other: Incidental shadowing echogenic stone in the gallbladder measures 1.3 cm. IMPRESSION: 1. No acute findings. 2. Incidentally imaged cholelithiasis. Electronically Signed   By: Lorin Picket M.D.   On: 01/24/2022 11:04    amLODipine  10 mg Oral q morning   aspirin EC  81 mg Oral Daily   atorvastatin  80 mg Oral q morning  clopidogrel  75 mg Oral q morning   gabapentin  100 mg Oral q morning   heparin injection (subcutaneous)  5,000 Units Subcutaneous Q8H   hydrALAZINE  50 mg Oral Q8H   insulin aspart  0-15 Units Subcutaneous TID WC   insulin aspart  10 Units Subcutaneous TID WC   insulin glargine-yfgn  20 Units Subcutaneous Daily   loratadine  10 mg Oral Daily   mometasone-formoterol  2 puff Inhalation BID   nebivolol  20 mg Oral Daily   pantoprazole  40 mg Oral q morning   QUEtiapine  300 mg Oral QHS   sertraline  50 mg Oral QPM   Vitamin D (Ergocalciferol)  50,000 Units Oral Q Mon    BMET    Component Value Date/Time   NA 129 (L) 01/26/2022 0255   K 4.9 01/26/2022 0255   CL 101 01/26/2022 0255   CO2 18 (L) 01/26/2022 0255   GLUCOSE 156 (H) 01/26/2022 0255   BUN 46 (H) 01/26/2022 0255   CREATININE 6.32 (H) 01/26/2022 0255   CREATININE 1.36 (H) 11/21/2020 1451   CALCIUM  8.1 (L) 01/26/2022 0255   GFRNONAA 7 (L) 01/26/2022 0255   GFRNONAA 43 (L) 11/21/2020 1451   GFRAA 50 (L) 11/21/2020 1451   CBC    Component Value Date/Time   WBC 5.4 01/25/2022 0057   RBC 3.23 (L) 01/25/2022 0057   HGB 9.7 (L) 01/25/2022 0057   HCT 29.0 (L) 01/25/2022 0057   PLT 180 01/25/2022 0057   MCV 89.8 01/25/2022 0057   MCH 30.0 01/25/2022 0057   MCHC 33.4 01/25/2022 0057   RDW 13.5 01/25/2022 0057   LYMPHSABS 1.1 12/30/2021 2342   MONOABS 0.6 12/30/2021 2342   EOSABS 0.1 12/30/2021 2342   BASOSABS 0.0 12/30/2021 2342    Assessment/Plan:  AKI/CKD stage IIIb - multiple renal insults including cocaine, hypertensive urgency with rapid over correction and hypotension with concomitant ARB, as well as CIN from IV contrast +/- NSAIDs.  UOP had been picking up but none since yesterday and BUN/Cr continue to climb.  She has no uremic signs or symptoms.  Continue with current BP regimen.  Continue to hold ARB, HCTZ, and metformin.  Will give a dose of IV lasix but I discussed with her that given her worsening AKI and oliguria, she would benefit from a session of HD.  She is amenable.  I am hopeful that this will not be long-term as her Scr was only mildly elevated prior to her admission, however she had multiple renal insults.   Will consult IR for temp HD catheter and HD later today.  HTN urgency - improved.  Continue with amlodipine and hydralazine.  Nebivolol on hold due to cocaine +, ARB/HCTZ on hold due to AKI. Acute on chronic diastolic CHF - improved.  Lasix on hold due to AKI. Anemia of CKD stage IIIb - follow H/H and transfuse prn.  Will check iron stores.  Hold off on ESA for now. CAD - with demand ischemia.  Improved. Cocaine abuse - we discussed the risks to her health with ongoing use.  She reports that she had been clean for 20 years until this April.  Is willing to stop and move out of her current situation.  DM type 2- per primary.  Donetta Potts, MD [facility]

## 2022-01-26 NOTE — Progress Notes (Signed)
Progress Note  Patient: Carol [name] [id number]:[id number] DOB: [birth year]  DOA: 01/22/2022  DOS: 01/26/2022    Brief hospital course: Carol [name] is a 61 y.o. female with a history of IDT2DM, uncontrolled HTN, cocaine use, stage IIIb CKD, CAD s/p recent NSTEMI managed medically who presented 6/5 with chest pain and dyspnea. She was cocaine positive, appeared volume overloaded with dependent edema. Troponin was 50. CXR suggested interstitial edema. CTA chest showed no aortic dissection, CT head nonacute. Cardiology was consulted and the patient was admitted on nitroglycerin infusion with hypertensive urgency. Mild troponin elevation is felt to be due to demand ischemia, and with improvement in HTN, symptoms have resolved. IV lasix was started but stopped the day after admission. Unfortunately, she has developed oliguric AKI, with creatinine trending upward. 1.7 > 2.4 > 4 > 5. Nephrology is consulted and supportive treatment ongoing.  Assessment and Plan: Uncontrolled HTN with HTN urgency: Precipitated by cocaine +/- incomplete medication adherence. BP precipitously improved with NTG gtt which was stopped. - Continue amlodipine 10 mg, hydralazine 50mg  po TID (home dose), can increase this if becomes severely hypertensive again.  - Holding nebivolol with +cocaine, would continue to avoid cardioselective beta blockers going forward. - Restarted imdur  - Hold ARB/thiazide.   ARF on CKD IIIb: In setting of HTN urgency, ARB/thiazide use, cocaine use. Worsened with abrupt reduction in BP and after IV lasix and contrasted CTA chest. Becoming more oliguric and creatinine still climbing. Renal U/S without hydro or increased echogenicity. FENa reassuringly < 1%, no casts on UA. Significant proteinuria w/UPr:Cr 6. - Appreciate nephrology folowing. Consulted IR for temp cath given her worsening renal failure and oliguria with beginnings of volume overload. IV lasix x1.  - Monitor UOP.  Acute on  chronic HFpEF: CXR showing vascular congestion and pulmonary edema.  BNP elevated to 618. - Becoming hypervolemic with oliguria, give lasix.  - HTN medications as above  Acute hypoxic respiratory failure: Suspected acute pulmonary edema.  - Check CXR - Continue supplemental oxygen to maintain SpO2 >90%  Thyroid nodule: Seen on CTA. TSH and free T4 wnl.  - Needs U/S as outpatient, consideration of further FNA based on characteristics.   Demand myocardial ischemia due to cocaine intoxication/HTN urgency in pt with known CAD. Not consistent with ACS, plan outpatient follow up and risk factor modification. hx NSTEMI and had LHC of 5/3 showing 100% stenosis of a small sub-branch of the right PDA. Medical management with DAPT, beta blocker, and statin was recommended.  - Continue aspirin and plavix, statin, holding BB w/+cocaine.  Cocaine use: +UDS.  - Cessation counseling provided.  IDT2DM: HbA1c 6.4%.  -Home regimen on 30 Units Lantus and 15 units Humalog TID, decreased here with limited po intake and ARF. Continue glargine 20u daily, and aspart 10u TID + mod SSI. Anticipate decreasing aspart as renal function worsens, but currently at inpatient goal and taking good po.     Anemia of CKD: Hgb down but has stabilized. - Changed VTE ppx to heparin - Monitor CBC in AM, add anemia panel.   HLD:  - Continue statin  Prolonged QT interval: QTc of 514 on EKG.   - Avoid QT prolongation medication.  GERD:  - PPI  Anxiety:  - prn atarax. Discussed prohibitive risk of benzodiazepines  Obesity: Estimated body mass index is 33.68 kg/m as calculated from the following:   Height as of this encounter: 5\' 4"  (1.626 m).   Weight as of this encounter: 89 kg.  Subjective:  Overnight began having more shortness of breath. This is worse with exertion and laying flat. Swelling has remained stable, possibly worse. No chest pain. No UOP/24hrs.   Objective: Vitals:   01/25/22 2018 01/25/22 2336 01/26/22  0218 01/26/22 0348  BP: 103/66 116/63  107/65  Pulse: 61 60  64  Resp: 18 15  18   Temp: 99.8 F (37.7 C) 99.4 F (37.4 C)  98.6 F (37 C)  TempSrc: Oral Oral  Oral  SpO2: 98% 98% 98% 99%  Weight:    89 kg  Height:      Gen: 61 y.o. female in no distress Pulm: Nonlabored breathing supplemental oxygen, crackles at bases. CV: Regular rate and rhythm. No murmur, rub, or gallop. No JVD, stable 1+ dependent edema. GI: Abdomen soft, non-tender, non-distended, with normoactive bowel sounds.  Ext: Warm, no deformities Skin: No rashes, lesions or ulcers on visualized skin. Neuro: Alert and oriented. No focal neurological deficits. Psych: Judgement and insight appear fair. Mood euthymic & affect congruent. Behavior is appropriate.    Data Personally reviewed:  CBC: Recent Labs  Lab 01/22/22 0845 01/23/22 0537 01/24/22 0209 01/25/22 0057  WBC 6.3 4.0 4.5 5.4  HGB 11.3* 10.2* 9.4* 9.7*  HCT 34.4* 30.4* 27.9* 29.0*  MCV 89.8 88.1 89.1 89.8  PLT 214 193 156 735   Basic Metabolic Panel: Recent Labs  Lab 01/22/22 0845 01/23/22 0537 01/24/22 0209 01/25/22 0057 01/26/22 0255  NA 140 140 133* 132* 129*  K 3.6 3.7 3.6 4.2 4.9  CL 110 111 102 103 101  CO2 16* 20* 20* 20* 18*  GLUCOSE 132* 156* 119* 132* 156*  BUN 10 17 27* 39* 46*  CREATININE 1.79* 2.48* 4.00* 5.00* 6.32*  CALCIUM 8.9 8.5* 8.0* 8.0* 8.1*  PHOS  --   --   --   --  4.7*   TSH: 1.374 Free T4: 0.61 UPr:Cr 6.06, FENa 0.82% UA: >300 protein, 50 glucose, 11-20 WBCs, no RBCs.  Renal U/S 6/7: Normal echogenicity. 1. No acute findings. 2. Incidentally imaged cholelithiasis.    CTA chest 6/5:  1. No acute aortic findings. 2. Mild cardiomegaly and left ventricular hypertrophy. 3. Solid 2.7 cm left thyroid nodule. Recommend thyroid US (ref: J Am Coll Radiol. 2015 Feb;12(2): 143-50). 4. Aortic Atherosclerosis (ICD10-I70.0). 5. Possible hepatic steatosis. 6. Cholelithiasis. 7. Mild subpleural reticular interstitial  accentuation in the lungs, nonspecific. No substantial advancement compared to the appearance of the lung bases on 01/15/2020.  Family Communication: None at bedside  Disposition: Status is: Inpatient Remains inpatient appropriate because: Renal failure requiring dialysis Planned Discharge Destination: Home   Patrecia Pour, MD 01/26/2022 10:56 AM Page by [email or web address]

## 2022-01-26 NOTE — Procedures (Signed)
Pre-procedure Diagnosis: ESRD Post-procedure Diagnosis: Same  Successful placement of a non-tunneled HD catheter with tips terminating within the superior aspect of the right atrium.    Complications: None Immediate EBL: Trace   The catheter is ready for immediate use.   Ronny Bacon, MD Pager #: [phone]

## 2022-01-27 ENCOUNTER — Inpatient Hospital Stay (HOSPITAL_COMMUNITY): Payer: Medicare Other

## 2022-01-27 DIAGNOSIS — I5033 Acute on chronic diastolic (congestive) heart failure: Secondary | ICD-10-CM | POA: Diagnosis not present

## 2022-01-27 DIAGNOSIS — N179 Acute kidney failure, unspecified: Secondary | ICD-10-CM | POA: Diagnosis not present

## 2022-01-27 DIAGNOSIS — I160 Hypertensive urgency: Secondary | ICD-10-CM | POA: Diagnosis not present

## 2022-01-27 DIAGNOSIS — I2510 Atherosclerotic heart disease of native coronary artery without angina pectoris: Secondary | ICD-10-CM | POA: Diagnosis not present

## 2022-01-27 LAB — CBC
HCT: 26.9 % — ABNORMAL LOW (ref 36.0–46.0)
Hemoglobin: 8.8 g/dL — ABNORMAL LOW (ref 12.0–15.0)
MCH: 29.1 pg (ref 26.0–34.0)
MCHC: 32.7 g/dL (ref 30.0–36.0)
MCV: 89.1 fL (ref 80.0–100.0)
Platelets: 169 10*3/uL (ref 150–400)
RBC: 3.02 MIL/uL — ABNORMAL LOW (ref 3.87–5.11)
RDW: 13.5 % (ref 11.5–15.5)
WBC: 5.9 10*3/uL (ref 4.0–10.5)
nRBC: 0 % (ref 0.0–0.2)

## 2022-01-27 LAB — RENAL FUNCTION PANEL
Albumin: 2.4 g/dL — ABNORMAL LOW (ref 3.5–5.0)
Anion gap: 12 (ref 5–15)
BUN: 55 mg/dL — ABNORMAL HIGH (ref 6–20)
CO2: 19 mmol/L — ABNORMAL LOW (ref 22–32)
Calcium: 8.5 mg/dL — ABNORMAL LOW (ref 8.9–10.3)
Chloride: 100 mmol/L (ref 98–111)
Creatinine, Ser: 6.64 mg/dL — ABNORMAL HIGH (ref 0.44–1.00)
GFR, Estimated: 7 mL/min — ABNORMAL LOW (ref >60)
Glucose, Bld: 127 mg/dL — ABNORMAL HIGH (ref 70–99)
Phosphorus: 5.5 mg/dL — ABNORMAL HIGH (ref 2.5–4.6)
Potassium: 5 mmol/L (ref 3.5–5.1)
Sodium: 131 mmol/L — ABNORMAL LOW (ref 135–145)

## 2022-01-27 LAB — FOLATE: Folate: 8.1 ng/mL (ref >5.9)

## 2022-01-27 LAB — RETICULOCYTES
Immature Retic Fract: 16 % — ABNORMAL HIGH (ref 2.3–15.9)
RBC.: 3.04 MIL/uL — ABNORMAL LOW (ref 3.87–5.11)
Retic Count, Absolute: 37.1 10*3/uL (ref 19.0–186.0)
Retic Ct Pct: 1.2 % (ref 0.4–3.1)

## 2022-01-27 LAB — FERRITIN: Ferritin: 21 ng/mL (ref 11–307)

## 2022-01-27 LAB — GLUCOSE, CAPILLARY
Glucose-Capillary: 123 mg/dL — ABNORMAL HIGH (ref 70–99)
Glucose-Capillary: 210 mg/dL — ABNORMAL HIGH (ref 70–99)
Glucose-Capillary: 100 mg/dL — ABNORMAL HIGH (ref 70–99)

## 2022-01-27 LAB — IRON AND TIBC
Iron: 24 ug/dL — ABNORMAL LOW (ref 28–170)
Saturation Ratios: 7 % — ABNORMAL LOW (ref 10.4–31.8)
TIBC: 339 ug/dL (ref 250–450)
UIBC: 315 ug/dL

## 2022-01-27 LAB — VITAMIN B12: Vitamin B-12: 244 pg/mL (ref 180–914)

## 2022-01-27 LAB — HEPATITIS B SURFACE ANTIBODY, QUANTITATIVE: Hep B S AB Quant (Post): <3.1 m[IU]/mL — ABNORMAL LOW (ref >9.9)

## 2022-01-27 MED ORDER — SODIUM CHLORIDE 0.9 % IV SOLN
100.0000 mg | INTRAVENOUS | Status: AC
  Administered 2022-01-27 – 2022-01-31 (×5): 100 mg via INTRAVENOUS
  Filled 2022-01-27 (×5): qty 5

## 2022-01-27 MED ORDER — HEPARIN SODIUM (PORCINE) 1000 UNIT/ML IJ SOLN
INTRAMUSCULAR | Status: AC
  Filled 2022-01-27: qty 4

## 2022-01-27 NOTE — Progress Notes (Signed)
Pt to hd via stretcher

## 2022-01-27 NOTE — Progress Notes (Signed)
Progress Note  Patient: Carol [name] EQA:[id number] DOB: 10-Sep-1960  DOA: 01/22/2022  DOS: 01/27/2022    Brief hospital course: Jalysa Swopes is a 61 y.o. female with a history of IDT2DM, uncontrolled HTN, cocaine use, stage IIIb CKD, CAD s/p recent NSTEMI managed medically who presented 6/5 with chest pain and dyspnea. She was cocaine positive, appeared volume overloaded with dependent edema. Troponin was 50. CXR suggested interstitial edema. CTA chest showed no aortic dissection, CT head nonacute. Cardiology was consulted and the patient was admitted on nitroglycerin infusion with hypertensive urgency. Mild troponin elevation is felt to be due to demand ischemia, and with improvement in HTN, symptoms have resolved. IV lasix was started but stopped the day after admission. Unfortunately, she has developed oliguric AKI, with creatinine trending upward. 1.7 > 2.4 > 4 > 5. Nephrology is consulted and supportive treatment ongoing.  Assessment and Plan: Uncontrolled HTN with HTN urgency: Precipitated by cocaine +/- incomplete medication adherence. BP precipitously improved with NTG gtt which was stopped. - Continue amlodipine 10 mg, hydralazine 50mg  po TID (home dose), can increase this if becomes severely hypertensive again.  - Holding nebivolol with +cocaine, would continue to avoid cardioselective beta blockers going forward. - Restarted imdur  - Hold ARB/thiazide.   ARF on CKD IIIb: In setting of HTN urgency, ARB/thiazide use, cocaine use. Worsened with abrupt reduction in BP and after IV lasix and contrasted CTA chest. Becoming more oliguric and creatinine still climbing. Renal U/S without hydro or increased echogenicity. FENa reassuringly < 1%, no casts on UA. Significant proteinuria w/UPr:Cr 6. - Appreciate nephrology folowing. s/p temporary right IJ HD catheter and initial HD on 6/9. Continue monitoring BMP and UOP.  Acute on chronic HFpEF: CXR showing vascular congestion and  pulmonary edema.  BNP elevated to 618. - Becoming hypervolemic with oliguria, improved with HD, only 500cc UF. - HTN medications as above  Acute hypoxic respiratory failure: Suspected acute pulmonary edema. Off oxygen this AM with improvement after HD. Post-HD CXR demonstrates asymmetric airspace opacity. Without fever or leukocytosis, clinical suspicion for pneumonia is low.  - Monitor off abx for now, check CBC/PCT in AM and follow symptoms/fever curve.  - Continue supplemental oxygen to maintain SpO2 >90%  Thyroid nodule: Seen on CTA. TSH and free T4 wnl.  - Needs U/S as outpatient, consideration of further FNA based on characteristics.   Demand myocardial ischemia due to cocaine intoxication/HTN urgency in pt with known CAD. Not consistent with ACS, plan outpatient follow up and risk factor modification. hx NSTEMI and had LHC of 5/3 showing 100% stenosis of a small sub-branch of the right PDA. Medical management with DAPT, beta blocker, and statin was recommended.  - Continue aspirin and plavix, statin, holding BB w/+cocaine.  Cocaine use: +UDS.  - Cessation counseling provided.  IDT2DM: HbA1c 6.4%.  -Home regimen on 30 Units Lantus and 15 units Humalog TID, decreased here with limited po intake and ARF. Continue glargine 20u daily, and aspart 10u TID + mod SSI. Anticipate decreasing aspart as renal function worsens, but currently at inpatient goal and taking good po.    - CBG elevated after breakfast when mealtime insulin not given.   Anemia of CKD and iron deficiency: Hgb down but has stabilized. - Changed VTE ppx to heparin - Monitor CBC.  - IV iron  HLD:  - Continue statin  Prolonged QT interval: QTc of 514 on EKG.   - Avoid QT prolongation medication.  GERD:  - PPI  Anxiety:  -  prn atarax. Discussed prohibitive risk of benzodiazepines  Obesity: Estimated body mass index is 33.49 kg/m as calculated from the following:   Height as of this encounter: 5\' 4"  (1.626 m).    Weight as of this encounter: 88.5 kg.  Subjective: Shortness of breath resolved after HD. No fever, cough, chest pain.   Objective: Vitals:   01/27/22 0635 01/27/22 0717 01/27/22 0812 01/27/22 1043  BP: 103/77 109/61  116/64  Pulse: 63 65  70  Resp: 14 18  17   Temp: 99.4 F (37.4 C) 98.6 F (37 C)  98.2 F (36.8 C)  TempSrc: Oral Oral  Oral  SpO2: 96% 96% 100% 99%  Weight:      Height:      Gen: 60 y.o. female in no distress Pulm: Nonlabored breathing room air. Clear. CV: Regular rate and rhythm. No murmur, rub, or gallop. No JVD, trace dependent edema. GI: Abdomen soft, non-tender, non-distended, with normoactive bowel sounds.  Ext: Warm, no deformities Skin: No new rashes, lesions or ulcers on visualized skin. Right IJ temp nontunneled cath site is c/d/i Neuro: Alert and oriented. No focal neurological deficits. Psych: Judgement and insight appear fair. Mood euthymic & affect congruent. Behavior is appropriate.    Data Personally reviewed:  CBC: Recent Labs  Lab 01/22/22 0845 01/23/22 0537 01/24/22 0209 01/25/22 0057 01/27/22 0046  WBC 6.3 4.0 4.5 5.4 5.9  HGB 11.3* 10.2* 9.4* 9.7* 8.8*  HCT 34.4* 30.4* 27.9* 29.0* 26.9*  MCV 89.8 88.1 89.1 89.8 89.1  PLT 214 193 156 180 416   Basic Metabolic Panel: Recent Labs  Lab 01/23/22 0537 01/24/22 0209 01/25/22 0057 01/26/22 0255 01/27/22 0046  NA 140 133* 132* 129* 131*  K 3.7 3.6 4.2 4.9 5.0  CL 111 102 103 101 100  CO2 20* 20* 20* 18* 19*  GLUCOSE 156* 119* 132* 156* 127*  BUN 17 27* 39* 46* 55*  CREATININE 2.48* 4.00* 5.00* 6.32* 6.64*  CALCIUM 8.5* 8.0* 8.0* 8.1* 8.5*  PHOS  --   --   --  4.7* 5.5*   TSH: 1.374 Free T4: 0.61 UPr:Cr 6.06, FENa 0.82% UA: >300 protein, 50 glucose, 11-20 WBCs, no RBCs.  Renal U/S 6/7: Normal echogenicity. 1. No acute findings. 2. Incidentally imaged cholelithiasis.    CTA chest 6/5:  1. No acute aortic findings. 2. Mild cardiomegaly and left ventricular hypertrophy. 3.  Solid 2.7 cm left thyroid nodule. Recommend thyroid US (ref: J Am Coll Radiol. 2015 Feb;12(2): 143-50). 4. Aortic Atherosclerosis (ICD10-I70.0). 5. Possible hepatic steatosis. 6. Cholelithiasis. 7. Mild subpleural reticular interstitial accentuation in the lungs, nonspecific. No substantial advancement compared to the appearance of the lung bases on 01/15/2020.  Family Communication: None at bedside  Disposition: Status is: Inpatient Remains inpatient appropriate because: Renal failure requiring dialysis Planned Discharge Destination: Home   Patrecia Pour, MD 01/27/2022 12:08 PM Page by [email or web address]

## 2022-01-27 NOTE — Progress Notes (Addendum)
Patient ID: Carol [name], female   DOB: 1961/08/20, 60 y.o.   MRN: [id number] S: Feels a little better after HD yesterday. O:BP 116/64 (BP Location: Right Arm)   Pulse 70   Temp 98.2 F (36.8 C) (Oral)   Resp 17   Ht _0  (1.626 m)   Wt 88.5 kg   SpO2 99%   BMI 33.49 kg/m   Intake/Output Summary (Last 24 hours) at 01/27/2022 1150 Last data filed at 01/27/2022 1044 Gross per 24 hour  Intake 600 ml  Output 900 ml  Net -300 ml   Intake/Output: I/O last 3 completed shifts: In: 360 [P.O.:360] Out: 500 [Other:500]  Intake/Output this shift:  Total I/O In: 240 [P.O.:240] Out: 400 [Urine:400] Weight change: -0.5 kg Gen: NAD CVS: RRR Resp:CTA Abd: +BS, soft, NT/ND Ext:1+ edema  Recent Labs  Lab 01/22/22 0845 01/23/22 0537 01/24/22 0209 01/25/22 0057 01/26/22 0255 01/27/22 0046  NA 140 140 133* 132* 129* 131*  K 3.6 3.7 3.6 4.2 4.9 5.0  CL 110 111 102 103 101 100  CO2 16* 20* 20* 20* 18* 19*  GLUCOSE 132* 156* 119* 132* 156* 127*  BUN 10 17 27* 39* 46* 55*  CREATININE 1.79* 2.48* 4.00* 5.00* 6.32* 6.64*  ALBUMIN 2.7*  --   --  2.2* 2.4* 2.4*  CALCIUM 8.9 8.5* 8.0* 8.0* 8.1* 8.5*  PHOS  --   --   --   --  4.7* 5.5*  AST 31  --   --  21  --   --   ALT 13  --   --  10  --   --    Liver Function Tests: Recent Labs  Lab 01/22/22 0845 01/25/22 0057 01/26/22 0255 01/27/22 0046  AST 31 21  --   --   ALT 13 10  --   --   ALKPHOS 123 94  --   --   BILITOT 0.4 0.6  --   --   PROT 6.8 5.6*  --   --   ALBUMIN 2.7* 2.2* 2.4* 2.4*   No results for input(s): "LIPASE", "AMYLASE" in the last 168 hours. No results for input(s): "AMMONIA" in the last 168 hours. CBC: Recent Labs  Lab 01/22/22 0845 01/23/22 0537 01/24/22 0209 01/25/22 0057 01/27/22 0046  WBC 6.3 4.0 4.5 5.4 5.9  HGB 11.3* 10.2* 9.4* 9.7* 8.8*  HCT 34.4* 30.4* 27.9* 29.0* 26.9*  MCV 89.8 88.1 89.1 89.8 89.1  PLT 214 193 156 180 169   Cardiac Enzymes: No results for input(s): "CKTOTAL",  "CKMB", "CKMBINDEX", "TROPONINI" in the last 168 hours. CBG: Recent Labs  Lab 01/26/22 1103 01/26/22 1707 01/26/22 2113 01/27/22 0633 01/27/22 1108  GLUCAP 192* 172* 123* 123* 210*    Iron Studies:  Recent Labs    01/27/22 0046  IRON 24*  TIBC 339  FERRITIN 21   Studies/Results: DG CHEST PORT 1 VIEW  Result Date: 01/27/2022 CLINICAL DATA:  Chest pain and hypertension. EXAM: PORTABLE CHEST 1 VIEW COMPARISON:  01/22/2022 FINDINGS: a The cardio pericardial silhouette is enlarged. The lungs are clear without focal pneumonia, edema, pneumothorax or pleural effusion. Patchy airspace disease at the right base suggests pneumonia. Right IJ central line tip overlies the SVC/RA junction. Telemetry leads overlie the chest. IMPRESSION: Patchy airspace disease at the right base suggests pneumonia. Electronically Signed   By: Misty Stanley M.D.   On: 01/27/2022 10:05   IR Fluoro Guide CV Line Right  Result Date: 01/26/2022 INDICATION: Acute renal insufficiency.  Please perform image guided placement of a temporary hemodialysis catheter for the initiation of hemodialysis. EXAM: NON-TUNNELED CENTRAL VENOUS HEMODIALYSIS CATHETER PLACEMENT WITH ULTRASOUND AND FLUOROSCOPIC GUIDANCE COMPARISON:  None Available. MEDICATIONS: None FLUOROSCOPY TIME:  30 seconds COMPLICATIONS: None immediate. PROCEDURE: Informed written consent was obtained from the patient after a discussion of the risks, benefits, and alternatives to treatment. Questions regarding the procedure were encouraged and answered. The right neck and chest were prepped with chlorhexidine in a sterile fashion, and a sterile drape was applied covering the operative field. Maximum barrier sterile technique with sterile gowns and gloves were used for the procedure. A timeout was performed prior to the initiation of the procedure. After the overlying soft tissues were anesthetized, a small venotomy incision was created and a micropuncture kit was utilized to  access the internal jugular vein. Real-time ultrasound guidance was utilized for vascular access including the acquisition of a permanent ultrasound image documenting patency of the accessed vessel. The microwire was utilized to measure appropriate catheter length. A stiff glidewire was advanced to the level of the IVC. Under fluoroscopic guidance, the venotomy was serially dilated, ultimately allowing placement of a 16 cm temporary Trialysis catheter with tip ultimately terminating within the superior aspect of the right atrium. Final catheter positioning was confirmed and documented with a spot radiographic image. The catheter aspirates and flushes normally. The catheter was flushed with appropriate volume heparin dwells. The catheter exit site was secured with a 0-Prolene retention suture. A dressing was placed. The patient tolerated the procedure well without immediate post procedural complication. IMPRESSION: Successful placement of a right internal jugular approach 16 cm temporary dialysis catheter with tip terminating with in the superior aspect of the right atrium. The catheter is ready for immediate use. PLAN: This catheter may be converted to a tunneled dialysis catheter at a later date as indicated. Electronically Signed   By: Sandi Mariscal M.D.   On: 01/26/2022 14:48   IR US Guide Vasc Access Right  Result Date: 01/26/2022 INDICATION: Acute renal insufficiency. Please perform image guided placement of a temporary hemodialysis catheter for the initiation of hemodialysis. EXAM: NON-TUNNELED CENTRAL VENOUS HEMODIALYSIS CATHETER PLACEMENT WITH ULTRASOUND AND FLUOROSCOPIC GUIDANCE COMPARISON:  None Available. MEDICATIONS: None FLUOROSCOPY TIME:  30 seconds COMPLICATIONS: None immediate. PROCEDURE: Informed written consent was obtained from the patient after a discussion of the risks, benefits, and alternatives to treatment. Questions regarding the procedure were encouraged and answered. The right neck and  chest were prepped with chlorhexidine in a sterile fashion, and a sterile drape was applied covering the operative field. Maximum barrier sterile technique with sterile gowns and gloves were used for the procedure. A timeout was performed prior to the initiation of the procedure. After the overlying soft tissues were anesthetized, a small venotomy incision was created and a micropuncture kit was utilized to access the internal jugular vein. Real-time ultrasound guidance was utilized for vascular access including the acquisition of a permanent ultrasound image documenting patency of the accessed vessel. The microwire was utilized to measure appropriate catheter length. A stiff glidewire was advanced to the level of the IVC. Under fluoroscopic guidance, the venotomy was serially dilated, ultimately allowing placement of a 16 cm temporary Trialysis catheter with tip ultimately terminating within the superior aspect of the right atrium. Final catheter positioning was confirmed and documented with a spot radiographic image. The catheter aspirates and flushes normally. The catheter was flushed with appropriate volume heparin dwells. The catheter exit site was secured with a 0-Prolene retention  suture. A dressing was placed. The patient tolerated the procedure well without immediate post procedural complication. IMPRESSION: Successful placement of a right internal jugular approach 16 cm temporary dialysis catheter with tip terminating with in the superior aspect of the right atrium. The catheter is ready for immediate use. PLAN: This catheter may be converted to a tunneled dialysis catheter at a later date as indicated. Electronically Signed   By: Sandi Mariscal M.D.   On: 01/26/2022 14:48    amLODipine  10 mg Oral q morning   aspirin EC  81 mg Oral Daily   atorvastatin  80 mg Oral q morning   Chlorhexidine Gluconate Cloth  6 each Topical Q0600   clopidogrel  75 mg Oral q morning   gabapentin  100 mg Oral q morning    heparin injection (subcutaneous)  5,000 Units Subcutaneous Q8H   heparin sodium (porcine)       hydrALAZINE  50 mg Oral Q8H   insulin aspart  0-15 Units Subcutaneous TID WC   insulin aspart  10 Units Subcutaneous TID WC   insulin glargine-yfgn  20 Units Subcutaneous Daily   loratadine  10 mg Oral Daily   mometasone-formoterol  2 puff Inhalation BID   nebivolol  20 mg Oral Daily   pantoprazole  40 mg Oral q morning   QUEtiapine  300 mg Oral QHS   sertraline  50 mg Oral QPM   Vitamin D (Ergocalciferol)  50,000 Units Oral Q Mon    BMET    Component Value Date/Time   NA 131 (L) 01/27/2022 0046   K 5.0 01/27/2022 0046   CL 100 01/27/2022 0046   CO2 19 (L) 01/27/2022 0046   GLUCOSE 127 (H) 01/27/2022 0046   BUN 55 (H) 01/27/2022 0046   CREATININE 6.64 (H) 01/27/2022 0046   CREATININE 1.36 (H) 11/21/2020 1451   CALCIUM 8.5 (L) 01/27/2022 0046   GFRNONAA 7 (L) 01/27/2022 0046   GFRNONAA 43 (L) 11/21/2020 1451   GFRAA 50 (L) 11/21/2020 1451   CBC    Component Value Date/Time   WBC 5.9 01/27/2022 0046   RBC 3.04 (L) 01/27/2022 0046   RBC 3.02 (L) 01/27/2022 0046   HGB 8.8 (L) 01/27/2022 0046   HCT 26.9 (L) 01/27/2022 0046   PLT 169 01/27/2022 0046   MCV 89.1 01/27/2022 0046   MCH 29.1 01/27/2022 0046   MCHC 32.7 01/27/2022 0046   RDW 13.5 01/27/2022 0046   LYMPHSABS 1.1 12/30/2021 2342   MONOABS 0.6 12/30/2021 2342   EOSABS 0.1 12/30/2021 2342   BASOSABS 0.0 12/30/2021 2342    Assessment/Plan:  AKI/CKD stage IIIb - multiple renal insults including cocaine, hypertensive urgency with rapid over correction and hypotension with concomitant ARB, as well as CIN from IV contrast +/- NSAIDs.  UOP had been picking up but none since yesterday and BUN/Cr continue to climb.  She has no uremic signs or symptoms.  Continue with current BP regimen.  Continue to hold ARB, HCTZ, and metformin.  Will give a dose of IV lasix but I discussed with her that given her worsening AKI and oliguria,  she would benefit from a session of HD.  She is amenable.  I am hopeful that this will not be long-term as her Scr was only mildly elevated prior to her admission, however she had multiple renal insults.  S/p temp HD catheter 01/26/22 and completed first HD session late last night/early this morning.  Will hold off on further HD for now and follow  UOP and Scr. HTN urgency - improved.  Continue with amlodipine and hydralazine.  Nebivolol on hold due to cocaine +, ARB/HCTZ on hold due to AKI. Acute on chronic diastolic CHF - improved.  Lasix on hold due to AKI. Anemia of CKD stage IIIb - follow H/H and transfuse prn.  Hold off on ESA for now.  TSAT 7% will start IV iron and follow. CAD - with demand ischemia.  Improved. Cocaine abuse - we discussed the risks to her health with ongoing use.  She reports that she had been clean for 20 years until this April.  Is willing to stop and move out of her current situation.  DM type 2- per primary.  Donetta Potts, MD [facility]

## 2022-01-28 DIAGNOSIS — I160 Hypertensive urgency: Secondary | ICD-10-CM | POA: Diagnosis not present

## 2022-01-28 DIAGNOSIS — N179 Acute kidney failure, unspecified: Secondary | ICD-10-CM | POA: Diagnosis not present

## 2022-01-28 DIAGNOSIS — I2510 Atherosclerotic heart disease of native coronary artery without angina pectoris: Secondary | ICD-10-CM | POA: Diagnosis not present

## 2022-01-28 DIAGNOSIS — I5033 Acute on chronic diastolic (congestive) heart failure: Secondary | ICD-10-CM | POA: Diagnosis not present

## 2022-01-28 LAB — RENAL FUNCTION PANEL
Albumin: 2.5 g/dL — ABNORMAL LOW (ref 3.5–5.0)
Albumin: 2.6 g/dL — ABNORMAL LOW (ref 3.5–5.0)
Anion gap: 11 (ref 5–15)
Anion gap: 14 (ref 5–15)
BUN: 48 mg/dL — ABNORMAL HIGH (ref 6–20)
BUN: 53 mg/dL — ABNORMAL HIGH (ref 6–20)
CO2: 23 mmol/L (ref 22–32)
CO2: 19 mmol/L — ABNORMAL LOW (ref 22–32)
Calcium: 8.8 mg/dL — ABNORMAL LOW (ref 8.9–10.3)
Calcium: 8.8 mg/dL — ABNORMAL LOW (ref 8.9–10.3)
Chloride: 96 mmol/L — ABNORMAL LOW (ref 98–111)
Chloride: 96 mmol/L — ABNORMAL LOW (ref 98–111)
Creatinine, Ser: 5.45 mg/dL — ABNORMAL HIGH (ref 0.44–1.00)
Creatinine, Ser: 5.36 mg/dL — ABNORMAL HIGH (ref 0.44–1.00)
GFR, Estimated: 8 mL/min — ABNORMAL LOW (ref >60)
GFR, Estimated: 9 mL/min — ABNORMAL LOW (ref >60)
Glucose, Bld: 152 mg/dL — ABNORMAL HIGH (ref 70–99)
Glucose, Bld: 187 mg/dL — ABNORMAL HIGH (ref 70–99)
Phosphorus: 5.1 mg/dL — ABNORMAL HIGH (ref 2.5–4.6)
Phosphorus: 6.1 mg/dL — ABNORMAL HIGH (ref 2.5–4.6)
Potassium: 5.4 mmol/L — ABNORMAL HIGH (ref 3.5–5.1)
Potassium: 4.6 mmol/L (ref 3.5–5.1)
Sodium: 130 mmol/L — ABNORMAL LOW (ref 135–145)
Sodium: 129 mmol/L — ABNORMAL LOW (ref 135–145)

## 2022-01-28 LAB — GLUCOSE, CAPILLARY
Glucose-Capillary: 192 mg/dL — ABNORMAL HIGH (ref 70–99)
Glucose-Capillary: 138 mg/dL — ABNORMAL HIGH (ref 70–99)
Glucose-Capillary: 165 mg/dL — ABNORMAL HIGH (ref 70–99)
Glucose-Capillary: 157 mg/dL — ABNORMAL HIGH (ref 70–99)
Glucose-Capillary: 206 mg/dL — ABNORMAL HIGH (ref 70–99)

## 2022-01-28 LAB — PROCALCITONIN: Procalcitonin: 0.32 ng/mL

## 2022-01-28 LAB — CBC
HCT: 28.1 % — ABNORMAL LOW (ref 36.0–46.0)
Hemoglobin: 9.2 g/dL — ABNORMAL LOW (ref 12.0–15.0)
MCH: 29.4 pg (ref 26.0–34.0)
MCHC: 32.7 g/dL (ref 30.0–36.0)
MCV: 89.8 fL (ref 80.0–100.0)
Platelets: 161 10*3/uL (ref 150–400)
RBC: 3.13 MIL/uL — ABNORMAL LOW (ref 3.87–5.11)
RDW: 13.6 % (ref 11.5–15.5)
WBC: 5 10*3/uL (ref 4.0–10.5)
nRBC: 0 % (ref 0.0–0.2)

## 2022-01-28 MED ORDER — FUROSEMIDE 10 MG/ML IJ SOLN
160.0000 mg | Freq: Once | INTRAVENOUS | Status: AC
  Administered 2022-01-28: 160 mg via INTRAVENOUS
  Filled 2022-01-28: qty 10

## 2022-01-28 MED ORDER — SODIUM ZIRCONIUM CYCLOSILICATE 10 G PO PACK: 10.0000 g | PACK | Freq: Every day | ORAL | Status: DC

## 2022-01-28 MED ORDER — SODIUM ZIRCONIUM CYCLOSILICATE 10 G PO PACK
10.0000 g | PACK | Freq: Two times a day (BID) | ORAL | Status: DC
  Administered 2022-01-28 – 2022-01-29 (×3): 10 g via ORAL
  Filled 2022-01-28 (×3): qty 1

## 2022-01-28 NOTE — Progress Notes (Signed)
Patient ID: Carol [name], female   DOB: June 29, 1961, 61 y.o.   MRN: [id number] S: No events overnight.  Is having some SOB this am. O:BP 119/72 (BP Location: Right Arm)   Pulse 63   Temp (!) 97.5 F (36.4 C) (Oral)   Resp 17   Ht '5\' 4"'  (1.626 m)   Wt 94.9 kg   SpO2 97%   BMI 35.91 kg/m   Intake/Output Summary (Last 24 hours) at 01/28/2022 1108 Last data filed at 01/28/2022 6979 Gross per 24 hour  Intake 240 ml  Output 0 ml  Net 240 ml   Intake/Output: I/O last 3 completed shifts: In: 360 [P.O.:360] Out: 900 [Urine:400; Other:500]  Intake/Output this shift:  Total I/O In: 240 [P.O.:240] Out: 0  Weight change: 6.4 kg Gen:NAD CVS: RRR Resp: CTA Abd: +Bs, soft, NT/ND Ext: trace pretibial edema  Recent Labs  Lab 01/22/22 0845 01/23/22 0537 01/24/22 0209 01/25/22 0057 01/26/22 0255 01/27/22 0046 01/28/22 0200  NA 140 140 133* 132* 129* 131* 130*  K 3.6 3.7 3.6 4.2 4.9 5.0 5.4*  CL 110 111 102 103 101 100 96*  CO2 16* 20* 20* 20* 18* 19* 23  GLUCOSE 132* 156* 119* 132* 156* 127* 152*  BUN 10 17 27* 39* 46* 55* 48*  CREATININE 1.79* 2.48* 4.00* 5.00* 6.32* 6.64* 5.45*  ALBUMIN 2.7*  --   --  2.2* 2.4* 2.4* 2.5*  CALCIUM 8.9 8.5* 8.0* 8.0* 8.1* 8.5* 8.8*  PHOS  --   --   --   --  4.7* 5.5* 5.1*  AST 31  --   --  21  --   --   --   ALT 13  --   --  10  --   --   --    Liver Function Tests: Recent Labs  Lab 01/22/22 0845 01/25/22 0057 01/26/22 0255 01/27/22 0046 01/28/22 0200  AST 31 21  --   --   --   ALT 13 10  --   --   --   ALKPHOS 123 94  --   --   --   BILITOT 0.4 0.6  --   --   --   PROT 6.8 5.6*  --   --   --   ALBUMIN 2.7* 2.2* 2.4* 2.4* 2.5*   No results for input(s): "LIPASE", "AMYLASE" in the last 168 hours. No results for input(s): "AMMONIA" in the last 168 hours. CBC: Recent Labs  Lab 01/23/22 0537 01/24/22 0209 01/25/22 0057 01/27/22 0046 01/28/22 0200  WBC 4.0 4.5 5.4 5.9 5.0  HGB 10.2* 9.4* 9.7* 8.8* 9.2*  HCT 30.4* 27.9*  29.0* 26.9* 28.1*  MCV 88.1 89.1 89.8 89.1 89.8  PLT 193 156 180 169 161   Cardiac Enzymes: No results for input(s): "CKTOTAL", "CKMB", "CKMBINDEX", "TROPONINI" in the last 168 hours. CBG: Recent Labs  Lab 01/27/22 0633 01/27/22 1108 01/27/22 1522 01/27/22 2115 01/28/22 0606  GLUCAP 123* 210* 100* 192* 138*    Iron Studies:  Recent Labs    01/27/22 0046  IRON 24*  TIBC 339  FERRITIN 21   Studies/Results: DG CHEST PORT 1 VIEW  Result Date: 01/27/2022 CLINICAL DATA:  Chest pain and hypertension. EXAM: PORTABLE CHEST 1 VIEW COMPARISON:  01/22/2022 FINDINGS: a The cardio pericardial silhouette is enlarged. The lungs are clear without focal pneumonia, edema, pneumothorax or pleural effusion. Patchy airspace disease at the right base suggests pneumonia. Right IJ central line tip overlies the SVC/RA junction. Telemetry leads  overlie the chest. IMPRESSION: Patchy airspace disease at the right base suggests pneumonia. Electronically Signed   By: Misty Stanley M.D.   On: 01/27/2022 10:05   IR Fluoro Guide CV Line Right  Result Date: 01/26/2022 INDICATION: Acute renal insufficiency. Please perform image guided placement of a temporary hemodialysis catheter for the initiation of hemodialysis. EXAM: NON-TUNNELED CENTRAL VENOUS HEMODIALYSIS CATHETER PLACEMENT WITH ULTRASOUND AND FLUOROSCOPIC GUIDANCE COMPARISON:  None Available. MEDICATIONS: None FLUOROSCOPY TIME:  30 seconds COMPLICATIONS: None immediate. PROCEDURE: Informed written consent was obtained from the patient after a discussion of the risks, benefits, and alternatives to treatment. Questions regarding the procedure were encouraged and answered. The right neck and chest were prepped with chlorhexidine in a sterile fashion, and a sterile drape was applied covering the operative field. Maximum barrier sterile technique with sterile gowns and gloves were used for the procedure. A timeout was performed prior to the initiation of the  procedure. After the overlying soft tissues were anesthetized, a small venotomy incision was created and a micropuncture kit was utilized to access the internal jugular vein. Real-time ultrasound guidance was utilized for vascular access including the acquisition of a permanent ultrasound image documenting patency of the accessed vessel. The microwire was utilized to measure appropriate catheter length. A stiff glidewire was advanced to the level of the IVC. Under fluoroscopic guidance, the venotomy was serially dilated, ultimately allowing placement of a 16 cm temporary Trialysis catheter with tip ultimately terminating within the superior aspect of the right atrium. Final catheter positioning was confirmed and documented with a spot radiographic image. The catheter aspirates and flushes normally. The catheter was flushed with appropriate volume heparin dwells. The catheter exit site was secured with a 0-Prolene retention suture. A dressing was placed. The patient tolerated the procedure well without immediate post procedural complication. IMPRESSION: Successful placement of a right internal jugular approach 16 cm temporary dialysis catheter with tip terminating with in the superior aspect of the right atrium. The catheter is ready for immediate use. PLAN: This catheter may be converted to a tunneled dialysis catheter at a later date as indicated. Electronically Signed   By: Sandi Mariscal M.D.   On: 01/26/2022 14:48   IR US Guide Vasc Access Right  Result Date: 01/26/2022 INDICATION: Acute renal insufficiency. Please perform image guided placement of a temporary hemodialysis catheter for the initiation of hemodialysis. EXAM: NON-TUNNELED CENTRAL VENOUS HEMODIALYSIS CATHETER PLACEMENT WITH ULTRASOUND AND FLUOROSCOPIC GUIDANCE COMPARISON:  None Available. MEDICATIONS: None FLUOROSCOPY TIME:  30 seconds COMPLICATIONS: None immediate. PROCEDURE: Informed written consent was obtained from the patient after a discussion  of the risks, benefits, and alternatives to treatment. Questions regarding the procedure were encouraged and answered. The right neck and chest were prepped with chlorhexidine in a sterile fashion, and a sterile drape was applied covering the operative field. Maximum barrier sterile technique with sterile gowns and gloves were used for the procedure. A timeout was performed prior to the initiation of the procedure. After the overlying soft tissues were anesthetized, a small venotomy incision was created and a micropuncture kit was utilized to access the internal jugular vein. Real-time ultrasound guidance was utilized for vascular access including the acquisition of a permanent ultrasound image documenting patency of the accessed vessel. The microwire was utilized to measure appropriate catheter length. A stiff glidewire was advanced to the level of the IVC. Under fluoroscopic guidance, the venotomy was serially dilated, ultimately allowing placement of a 16 cm temporary Trialysis catheter with tip ultimately terminating within the  superior aspect of the right atrium. Final catheter positioning was confirmed and documented with a spot radiographic image. The catheter aspirates and flushes normally. The catheter was flushed with appropriate volume heparin dwells. The catheter exit site was secured with a 0-Prolene retention suture. A dressing was placed. The patient tolerated the procedure well without immediate post procedural complication. IMPRESSION: Successful placement of a right internal jugular approach 16 cm temporary dialysis catheter with tip terminating with in the superior aspect of the right atrium. The catheter is ready for immediate use. PLAN: This catheter may be converted to a tunneled dialysis catheter at a later date as indicated. Electronically Signed   By: Sandi Mariscal M.D.   On: 01/26/2022 14:48    amLODipine  10 mg Oral q morning   aspirin EC  81 mg Oral Daily   atorvastatin  80 mg Oral q  morning   Chlorhexidine Gluconate Cloth  6 each Topical Q0600   clopidogrel  75 mg Oral q morning   gabapentin  100 mg Oral q morning   heparin injection (subcutaneous)  5,000 Units Subcutaneous Q8H   hydrALAZINE  50 mg Oral Q8H   insulin aspart  0-15 Units Subcutaneous TID WC   insulin aspart  10 Units Subcutaneous TID WC   insulin glargine-yfgn  20 Units Subcutaneous Daily   loratadine  10 mg Oral Daily   mometasone-formoterol  2 puff Inhalation BID   nebivolol  20 mg Oral Daily   pantoprazole  40 mg Oral q morning   QUEtiapine  300 mg Oral QHS   sertraline  50 mg Oral QPM   sodium zirconium cyclosilicate  10 g Oral BID   Vitamin D (Ergocalciferol)  50,000 Units Oral Q Mon    BMET    Component Value Date/Time   NA 130 (L) 01/28/2022 0200   K 5.4 (H) 01/28/2022 0200   CL 96 (L) 01/28/2022 0200   CO2 23 01/28/2022 0200   GLUCOSE 152 (H) 01/28/2022 0200   BUN 48 (H) 01/28/2022 0200   CREATININE 5.45 (H) 01/28/2022 0200   CREATININE 1.36 (H) 11/21/2020 1451   CALCIUM 8.8 (L) 01/28/2022 0200   GFRNONAA 8 (L) 01/28/2022 0200   GFRNONAA 43 (L) 11/21/2020 1451   GFRAA 50 (L) 11/21/2020 1451   CBC    Component Value Date/Time   WBC 5.0 01/28/2022 0200   RBC 3.13 (L) 01/28/2022 0200   HGB 9.2 (L) 01/28/2022 0200   HCT 28.1 (L) 01/28/2022 0200   PLT 161 01/28/2022 0200   MCV 89.8 01/28/2022 0200   MCH 29.4 01/28/2022 0200   MCHC 32.7 01/28/2022 0200   RDW 13.6 01/28/2022 0200   LYMPHSABS 1.1 12/30/2021 2342   MONOABS 0.6 12/30/2021 2342   EOSABS 0.1 12/30/2021 2342   BASOSABS 0.0 12/30/2021 2342     Assessment/Plan:  AKI/CKD stage IIIb, oliguric - multiple renal insults including cocaine, hypertensive urgency with rapid over correction and hypotension with concomitant ARB, as well as CIN from IV contrast +/- NSAIDs.  UOP had been picking up but none since yesterday and BUN/Cr continue to climb.  She has no uremic signs or symptoms.  Continue with current BP regimen.   Continue to hold ARB, HCTZ, and metformin.  Will give a dose of IV lasix but I discussed with her that given her worsening AKI and oliguria which required initiation of HD.  S/p temp HD catheter 01/26/22 and completed first HD session late last night/early morning 6/10.   Will plan on  another session of HD tomorrow with more UF. Continue to follow UOP and Scr. Hyperkalemia - increase Lokelma to 10 gm bid and give dose of IV lasix 160 mg x 1 and follow.   HTN urgency - improved.  Continue with amlodipine and hydralazine.  Nebivolol on hold due to cocaine +, ARB/HCTZ on hold due to AKI. Acute on chronic diastolic CHF - improved.  Lasix on hold due to AKI. Anemia of CKD stage IIIb - follow H/H and transfuse prn.  Hold off on ESA for now.  TSAT 7% and started IV iron.  Continue to follow. CAD - with demand ischemia.  Improved. Cocaine abuse - we discussed the risks to her health with ongoing use.  She reports that she had been clean for 20 years until this April.  Is willing to stop and move out of her current situation.  DM type 2- per primary.    Donetta Potts, MD [facility]

## 2022-01-28 NOTE — Progress Notes (Addendum)
Progress Note  Patient: Carol [name] URK:[id number] DOB: Jul 31, 1961  DOA: 01/22/2022  DOS: 01/28/2022    Brief hospital course: Carol [name] is a 61 y.o. female with a history of IDT2DM, uncontrolled HTN, cocaine use, stage IIIb CKD, CAD s/p recent NSTEMI managed medically who presented 6/5 with chest pain and dyspnea. She was cocaine positive, appeared volume overloaded with dependent edema. Troponin was 50. CXR suggested interstitial edema. CTA chest showed no aortic dissection, CT head nonacute. Cardiology was consulted and the patient was admitted on nitroglycerin infusion with hypertensive urgency. Mild troponin elevation is felt to be due to demand ischemia, and with improvement in HTN, symptoms have resolved. IV lasix was started but stopped the day after admission. Unfortunately, she has developed oliguric AKI, with creatinine trending upward. 1.7 > 2.4 > 4 > 5. Nephrology is consulted and supportive treatment ongoing.  Assessment and Plan: Uncontrolled HTN with HTN urgency: Precipitated by cocaine +/- incomplete medication adherence. BP precipitously improved with NTG gtt which was stopped. - Continue amlodipine 10 mg, hydralazine 50mg  po TID (home dose), can increase this if becomes severely hypertensive again.  - Holding nebivolol with +cocaine, would consider coreg if beta blocker needs to be restarted - Restarted imdur  - Hold ARB/thiazide.   ARF on CKD IIIb: In setting of HTN urgency, ARB/thiazide use, cocaine use. Worsened with abrupt reduction in BP and after IV lasix and contrasted CTA chest. Becoming more oliguric and creatinine still climbing. Renal U/S without hydro or increased echogenicity. FENa reassuringly < 1%, no casts on UA. Significant proteinuria w/UPr:Cr 6. - Appreciate nephrology folowing. s/p temporary right IJ HD catheter and initial HD on 6/9. Continue monitoring BMP and UOP.  Acute on chronic HFpEF: CXR showing vascular congestion and pulmonary  edema.  BNP elevated to 618. - Becoming hypervolemic with oliguria. Attempt diuresis w/lasix, continue iHD per nephrology. - HTN medications as above  Hyperkalemia:  - Monitor cardiac telemetry - Lokelma ordered, augmented per nephrology - Lasix 160mg  IV - Recheck in PM. - Change to renal diet  Acute hypoxic respiratory failure: Suspected acute pulmonary edema. Post-HD CXR demonstrated asymmetric airspace opacity. Without fever or leukocytosis, clinical suspicion for pneumonia remains low - Monitor off abx for now, note pt has allergy to PCN but tolerated keflex in 2012. - Continue supplemental oxygen to maintain SpO2 >90%  Thyroid nodule: Seen on CTA. TSH and free T4 wnl.  - Needs U/S as outpatient, consideration of further FNA based on characteristics.   Demand myocardial ischemia due to cocaine intoxication/HTN urgency in pt with known CAD. Not consistent with ACS, plan outpatient follow up and risk factor modification. hx NSTEMI and had LHC of 5/3 showing 100% stenosis of a small sub-branch of the right PDA. Medical management with DAPT, beta blocker, and statin was recommended.  - Continue aspirin and plavix, statin, holding BB w/+cocaine.  Cocaine use: +UDS.  - Cessation counseling provided.  IDT2DM: HbA1c 6.4%.  -Home regimen on 30 Units Lantus and 15 units Humalog TID, decreased here with limited po intake and ARF. Continue glargine 20u daily, and aspart 10u TID + mod SSI. Anticipate decreasing aspart as renal function worsens, but currently at inpatient goal and taking good po.    - CBG elevated after breakfast when mealtime insulin not given.   Anemia of CKD and iron deficiency: Hgb down but has stabilized. - Changed VTE ppx to heparin - Monitor CBC.  - IV iron  HLD:  - Continue statin  Prolonged QT interval:  QTc of 514 on EKG.   - Avoid QT prolongation medication.  GERD:  - PPI  Anxiety:  - prn atarax. Discussed prohibitive risk of benzodiazepines  Obesity:  Estimated body mass index is 35.91 kg/m as calculated from the following:   Height as of this encounter: 5\' 4"  (1.626 m).   Weight as of this encounter: 94.9 kg.  Subjective: Feeling more short of breath over past 24 hours. No fever. Stable, mild cough. No chest pain.   Objective: Vitals:   01/27/22 2355 01/28/22 0445 01/28/22 0747 01/28/22 0811  BP: 135/70 111/64  119/72  Pulse: 64 (!) 58  63  Resp: 20 13  17   Temp: 98.4 F (36.9 C) 98.6 F (37 C)  (!) 97.5 F (36.4 C)  TempSrc: Oral Oral  Oral  SpO2: 100% 100% 99% 97%  Weight:  94.9 kg    Height:      Gen: 61 y.o. female in no distress Pulm: Nonlabored breathing supplemental oxygen, crackles at R > L bases. CV: Regular rate and rhythm. No murmur, rub, or gallop. No JVD, trace dependent edema. GI: Abdomen soft, non-tender, non-distended, with normoactive bowel sounds.  Ext: Warm, no deformities Skin: No rashes, lesions or ulcers on visualized skin. HD cath site c/d/i Neuro: Alert and oriented. No focal neurological deficits. Psych: Judgement and insight appear fair. Mood euthymic & affect congruent. Behavior is appropriate.    Data Personally reviewed:  CBC: Recent Labs  Lab 01/23/22 0537 01/24/22 0209 01/25/22 0057 01/27/22 0046 01/28/22 0200  WBC 4.0 4.5 5.4 5.9 5.0  HGB 10.2* 9.4* 9.7* 8.8* 9.2*  HCT 30.4* 27.9* 29.0* 26.9* 28.1*  MCV 88.1 89.1 89.8 89.1 89.8  PLT 193 156 180 169 935   Basic Metabolic Panel: Recent Labs  Lab 01/24/22 0209 01/25/22 0057 01/26/22 0255 01/27/22 0046 01/28/22 0200  NA 133* 132* 129* 131* 130*  K 3.6 4.2 4.9 5.0 5.4*  CL 102 103 101 100 96*  CO2 20* 20* 18* 19* 23  GLUCOSE 119* 132* 156* 127* 152*  BUN 27* 39* 46* 55* 48*  CREATININE 4.00* 5.00* 6.32* 6.64* 5.45*  CALCIUM 8.0* 8.0* 8.1* 8.5* 8.8*  PHOS  --   --  4.7* 5.5* 5.1*   TSH: 1.374 Free T4: 0.61 UPr:Cr 6.06, FENa 0.82% UA: >300 protein, 50 glucose, 11-20 WBCs, no RBCs.  Renal U/S 6/7: Normal echogenicity. 1.  No acute findings. 2. Incidentally imaged cholelithiasis.    CTA chest 6/5:  1. No acute aortic findings. 2. Mild cardiomegaly and left ventricular hypertrophy. 3. Solid 2.7 cm left thyroid nodule. Recommend thyroid US (ref: J Am Coll Radiol. 2015 Feb;12(2): 143-50). 4. Aortic Atherosclerosis (ICD10-I70.0). 5. Possible hepatic steatosis. 6. Cholelithiasis. 7. Mild subpleural reticular interstitial accentuation in the lungs, nonspecific. No substantial advancement compared to the appearance of the lung bases on 01/15/2020.  Family Communication: Friend at bedside  Disposition: Status is: Inpatient Remains inpatient appropriate because: Renal failure requiring dialysis Planned Discharge Destination: Home   Patrecia Pour, MD 01/28/2022 9:14 AM Page by [email or web address]

## 2022-01-28 NOTE — Progress Notes (Signed)
Occupational Therapy Treatment Patient Details Name: Cambri Plourde MRN: [id number] DOB: [birth year] Today's Date: 01/28/2022   History of present illness Patient is a 61 y/o female who presents on 01/22/22 with chest pain, SOB (in setting of cocaine withdrawal) and heart palpitations. Workup for HTN emergency and acute on chronic CHF. CXR-pulmonary edema. S/p RIJ temp HD cath placement 6/9. PMH includes anxiety, chronic back pain, depression, DM, HTN, PD, NSTEMI, cocaine use; of note, recent admission 5/13-5/17/23 for hypoglycemia, elevated BP and 5/3-12/24/21 for NSTEMI.   OT comments  Session limited due to increased dizziness. Pt did experience a drop in BP, however unsure what initial BP was at rest in bed. She continues to be independent with bed mobility and sit<>stands. Able to doff and don socks with no assist, as well as wash up EOB. Upon sitting EOB pt reported dizziness, it subsided quickly, however when she donned socks, and sat back up straight, pt started to sway back and forth. With standing, pt only maintained upright position for 10-15 seconds with swaying and decreased verbal responses, before OT had her return to supine. Vitals listed below. OT continues to recommend [facility] and will continue to follow acutely.   VITALS:  Became dizzy sitting up - sitting EOB - BP: 118/59 (77); HR: 65bpm - standing - BP: 106/57 (73); HR: 66bpm Pt had to sit after 10-15 seconds of standing - supine - 126/58 (77); HR: 65bpm    Recommendations for follow up therapy are one component of a multi-disciplinary discharge planning process, led by the attending physician.  Recommendations may be updated based on patient status, additional functional criteria and insurance authorization.    Follow Up Recommendations  Home health OT    Assistance Recommended at Discharge Set up Supervision/Assistance  Patient can return home with the following  A little help with bathing/dressing/bathroom;Assistance  with cooking/housework;Help with stairs or ramp for entrance;Assist for transportation   Equipment Recommendations  BSC/3in1    Recommendations for Other Services      Precautions / Restrictions Precautions Precautions: Fall Precaution Comments: watch BP Restrictions Weight Bearing Restrictions: No       Mobility Bed Mobility Overal bed mobility: Independent             General bed mobility comments: able to get to EOB without assistance    Transfers Overall transfer level: Modified independent Equipment used: Rolling walker (2 wheels) Transfers: Sit to/from Stand Sit to Stand: Modified independent (Device/Increase time)           General transfer comment: No difficulty coming to stand, had to sit quickly due to dizziness     Balance Overall balance assessment: History of Falls, Mild deficits observed, not formally tested                                         ADL either performed or assessed with clinical judgement   ADL Overall ADL's : Needs assistance/impaired     Grooming: Wash/dry face;Wash/dry hands;Supervision/safety;Sitting               Lower Body Dressing: Supervision/safety;Sitting/lateral leans Lower Body Dressing Details (indicate cue type and reason): able to doff and donn socks Toilet Transfer: Min guard;Ambulation;Rolling walker (2 wheels) Toilet Transfer Details (indicate cue type and reason): simulated in room           General ADL Comments: Session limited due to ortho statics  Extremity/Trunk Assessment              Vision       Perception     Praxis      Cognition Arousal/Alertness: Awake/alert Behavior During Therapy: WFL for tasks assessed/performed Overall Cognitive Status: Within Functional Limits for tasks assessed                                 General Comments: for basic mobility and self care        Exercises      Shoulder Instructions       General  Comments Vitals listed above.    Pertinent Vitals/ Pain       Pain Assessment Pain Assessment: No/denies pain  Home Living                                          Prior Functioning/Environment              Frequency  Min 2X/week        Progress Toward Goals  OT Goals(current goals can now be found in the care plan section)  Progress towards OT goals: Progressing toward goals  Acute Rehab OT Goals Patient Stated Goal: To go home OT Goal Formulation: With patient Time For Goal Achievement: 02/07/22 Potential to Achieve Goals: Good ADL Goals Pt Will Perform Upper Body Dressing: with modified independence;standing;sitting Pt Will Perform Lower Body Dressing: with modified independence;sitting/lateral leans;sit to/from stand Pt Will Perform Tub/Shower Transfer: Shower transfer;rolling walker;ambulating;3 in 1  Plan Discharge plan remains appropriate    Co-evaluation                 AM-PAC OT "6 Clicks" Daily Activity     Outcome Measure   Help from another person eating meals?: None Help from another person taking care of personal grooming?: None Help from another person toileting, which includes using toliet, bedpan, or urinal?: None Help from another person bathing (including washing, rinsing, drying)?: A Little Help from another person to put on and taking off regular upper body clothing?: None Help from another person to put on and taking off regular lower body clothing?: A Little 6 Click Score: 22    End of Session Equipment Utilized During Treatment: Rolling walker (2 wheels)  OT Visit Diagnosis: Unsteadiness on feet (R26.81);Other abnormalities of gait and mobility (R26.89);Muscle weakness (generalized) (M62.81)   Activity Tolerance Patient tolerated treatment well   Patient Left in bed;with call bell/phone within reach   Nurse Communication Mobility status        Time: 0569-7948 OT Time Calculation (min): 25  min  Charges: OT General Charges $OT Visit: 1 Visit OT Treatments $Self Care/Home Management : 8-22 mins $Therapeutic Activity: 8-22 mins  Carlis Blanchard H., OTR/L Acute Rehabilitation  Chellsea Beckers Elane Aliyanna Wassmer 01/28/2022, 4:52 PM

## 2022-01-29 DIAGNOSIS — I160 Hypertensive urgency: Secondary | ICD-10-CM | POA: Diagnosis not present

## 2022-01-29 LAB — GLUCOSE, CAPILLARY
Glucose-Capillary: 140 mg/dL — ABNORMAL HIGH (ref 70–99)
Glucose-Capillary: 144 mg/dL — ABNORMAL HIGH (ref 70–99)
Glucose-Capillary: 133 mg/dL — ABNORMAL HIGH (ref 70–99)
Glucose-Capillary: 120 mg/dL — ABNORMAL HIGH (ref 70–99)

## 2022-01-29 LAB — RENAL FUNCTION PANEL
Albumin: 2.3 g/dL — ABNORMAL LOW (ref 3.5–5.0)
Anion gap: 10 (ref 5–15)
BUN: 56 mg/dL — ABNORMAL HIGH (ref 6–20)
CO2: 23 mmol/L (ref 22–32)
Calcium: 8.8 mg/dL — ABNORMAL LOW (ref 8.9–10.3)
Chloride: 97 mmol/L — ABNORMAL LOW (ref 98–111)
Creatinine, Ser: 4.93 mg/dL — ABNORMAL HIGH (ref 0.44–1.00)
GFR, Estimated: 10 mL/min — ABNORMAL LOW (ref >60)
Glucose, Bld: 123 mg/dL — ABNORMAL HIGH (ref 70–99)
Phosphorus: 6.3 mg/dL — ABNORMAL HIGH (ref 2.5–4.6)
Potassium: 4.5 mmol/L (ref 3.5–5.1)
Sodium: 130 mmol/L — ABNORMAL LOW (ref 135–145)

## 2022-01-29 NOTE — Progress Notes (Signed)
Patient ID: Carol [name], female   DOB: 12/04/1960, 61 y.o.   MRN: [id number]  Assessment/Plan:  AKI/CKD stage IIIb, oliguric - multiple renal insults including cocaine, hypertensive urgency with rapid over correction and hypotension with concomitant ARB, as well as CIN from IV contrast +/- NSAIDs.  UOP had been picking up but none since yesterday and BUN/Cr continue to climb.  She has no uremic signs or symptoms.  Continue with current BP regimen.  Continue to hold ARB, HCTZ, and metformin.  Gave a dose of IV lasix on 6/11.   S/p temp HD catheter 01/26/22 and completed first HD session late last night/early morning 6/10.   Hold off on HD for now and will assess daily. She may be starting to recover kidney function. UOP starting to pick up (already 632ml today from 7am). Continue to follow UOP and Scr. Hyperkalemia - increased Lokelma to 10 gm bid and gave dose of IV lasix 160 mg x 1 on 6/11 and follow.  Stop Lokelma for now and will follow closely. HTN urgency - improved.  Continue with amlodipine and hydralazine.  Nebivolol on hold due to cocaine +, ARB/HCTZ on hold due to AKI. Acute on chronic diastolic CHF - improved.  Lasix on hold due to AKI. Anemia of CKD stage IIIb - follow H/H and transfuse prn.  Hold off on ESA for now.  TSAT 7% and started IV iron.  Continue to follow. CAD - with demand ischemia.  Improved. Cocaine abuse - we discussed the risks to her health with ongoing use.  She reports that she had been clean for 20 years until this April.  Is willing to stop and move out of her current situation.  DM type 2- per primary.  S: No events overnight.  Mild SOB today, no worse than yest. UOP really picking up.  O:BP (!) 114/59 (BP Location: Right Arm)   Pulse 66   Temp 98.6 F (37 C) (Oral)   Resp 20   Ht 5\' 4"  (1.626 m)   Wt 93.8 kg   SpO2 94%   BMI 35.50 kg/m   Intake/Output Summary (Last 24 hours) at 01/29/2022 1430 Last data filed at 01/29/2022 1300 Gross per 24 hour   Intake 740 ml  Output 1875 ml  Net -1135 ml   Intake/Output: I/O last 3 completed shifts: In: 480 [P.O.:480] Out: 1600 [Urine:1600]  Intake/Output this shift:  Total I/O In: 500 [P.O.:500] Out: 275 [Urine:275] Weight change: -1.1 kg Gen:NAD CVS: RRR Resp: CTA Abd: +Bs, soft, NT/ND Ext: trace pretibial edema  Recent Labs  Lab 01/24/22 0209 01/25/22 0057 01/26/22 0255 01/27/22 0046 01/28/22 0200 01/28/22 1433 01/29/22 0554  NA 133* 132* 129* 131* 130* 129* 130*  K 3.6 4.2 4.9 5.0 5.4* 4.6 4.5  CL 102 103 101 100 96* 96* 97*  CO2 20* 20* 18* 19* 23 19* 23  GLUCOSE 119* 132* 156* 127* 152* 187* 123*  BUN 27* 39* 46* 55* 48* 53* 56*  CREATININE 4.00* 5.00* 6.32* 6.64* 5.45* 5.36* 4.93*  ALBUMIN  --  2.2* 2.4* 2.4* 2.5* 2.6* 2.3*  CALCIUM 8.0* 8.0* 8.1* 8.5* 8.8* 8.8* 8.8*  PHOS  --   --  4.7* 5.5* 5.1* 6.1* 6.3*  AST  --  21  --   --   --   --   --   ALT  --  10  --   --   --   --   --    Liver Function Tests: Recent  Labs  Lab 01/25/22 0057 01/26/22 0255 01/28/22 0200 01/28/22 1433 01/29/22 0554  AST 21  --   --   --   --   ALT 10  --   --   --   --   ALKPHOS 94  --   --   --   --   BILITOT 0.6  --   --   --   --   PROT 5.6*  --   --   --   --   ALBUMIN 2.2*   < > 2.5* 2.6* 2.3*   < > = values in this interval not displayed.   No results for input(s): "LIPASE", "AMYLASE" in the last 168 hours. No results for input(s): "AMMONIA" in the last 168 hours. CBC: Recent Labs  Lab 01/23/22 0537 01/24/22 0209 01/25/22 0057 01/27/22 0046 01/28/22 0200  WBC 4.0 4.5 5.4 5.9 5.0  HGB 10.2* 9.4* 9.7* 8.8* 9.2*  HCT 30.4* 27.9* 29.0* 26.9* 28.1*  MCV 88.1 89.1 89.8 89.1 89.8  PLT 193 156 180 169 161   Cardiac Enzymes: No results for input(s): "CKTOTAL", "CKMB", "CKMBINDEX", "TROPONINI" in the last 168 hours. CBG: Recent Labs  Lab 01/28/22 1122 01/28/22 1611 01/28/22 2101 01/29/22 0611 01/29/22 1200  GLUCAP 165* 157* 206* 140* 144*    Iron Studies:   Recent Labs    01/27/22 0046  IRON 24*  TIBC 339  FERRITIN 21   Studies/Results: No results found.   amLODipine  10 mg Oral q morning   aspirin EC  81 mg Oral Daily   atorvastatin  80 mg Oral q morning   Chlorhexidine Gluconate Cloth  6 each Topical Q0600   clopidogrel  75 mg Oral q morning   gabapentin  100 mg Oral q morning   heparin injection (subcutaneous)  5,000 Units Subcutaneous Q8H   hydrALAZINE  50 mg Oral Q8H   insulin aspart  0-15 Units Subcutaneous TID WC   insulin aspart  10 Units Subcutaneous TID WC   insulin glargine-yfgn  20 Units Subcutaneous Daily   loratadine  10 mg Oral Daily   mometasone-formoterol  2 puff Inhalation BID   nebivolol  20 mg Oral Daily   pantoprazole  40 mg Oral q morning   QUEtiapine  300 mg Oral QHS   sertraline  50 mg Oral QPM   sodium zirconium cyclosilicate  10 g Oral BID   Vitamin D (Ergocalciferol)  50,000 Units Oral Q Mon    BMET    Component Value Date/Time   NA 130 (L) 01/29/2022 0554   K 4.5 01/29/2022 0554   CL 97 (L) 01/29/2022 0554   CO2 23 01/29/2022 0554   GLUCOSE 123 (H) 01/29/2022 0554   BUN 56 (H) 01/29/2022 0554   CREATININE 4.93 (H) 01/29/2022 0554   CREATININE 1.36 (H) 11/21/2020 1451   CALCIUM 8.8 (L) 01/29/2022 0554   GFRNONAA 10 (L) 01/29/2022 0554   GFRNONAA 43 (L) 11/21/2020 1451   GFRAA 50 (L) 11/21/2020 1451   CBC    Component Value Date/Time   WBC 5.0 01/28/2022 0200   RBC 3.13 (L) 01/28/2022 0200   HGB 9.2 (L) 01/28/2022 0200   HCT 28.1 (L) 01/28/2022 0200   PLT 161 01/28/2022 0200   MCV 89.8 01/28/2022 0200   MCH 29.4 01/28/2022 0200   MCHC 32.7 01/28/2022 0200   RDW 13.6 01/28/2022 0200   LYMPHSABS 1.1 12/30/2021 2342   MONOABS 0.6 12/30/2021 2342   EOSABS 0.1 12/30/2021 2342  BASOSABS 0.0 12/30/2021 2342

## 2022-01-29 NOTE — Progress Notes (Signed)
Progress Note  Patient: Carol [name] [id number] DOB: 06/04/1961  DOA: 01/22/2022  DOS: 01/29/2022    Brief hospital course: Taralee Marcus is a 61 y.o. female with a history of IDT2DM, uncontrolled HTN, cocaine use, stage IIIb CKD, CAD s/p recent NSTEMI managed medically who presented 6/5 with chest pain and dyspnea. She was cocaine positive, appeared volume overloaded with dependent edema. Troponin was 50. CXR suggested interstitial edema. CTA chest showed no aortic dissection, CT head nonacute. Cardiology was consulted and the patient was admitted on nitroglycerin infusion with hypertensive urgency. Mild troponin elevation is felt to be due to demand ischemia, and with improvement in HTN, symptoms have resolved. IV lasix was started but stopped the day after admission. Unfortunately, she has developed oliguric AKI, with creatinine trending upward. 1.7 > 2.4 > 4 > 5. Nephrology is consulted and supportive treatment ongoing.  Assessment and Plan:  Uncontrolled HTN with HTN urgency:  Precipitated by cocaine use/abuse with medication noncompliance Nitro drip previously required and now discontinued - Continue amlodipine 10 mg, hydralazine 50mg  po TID, Imdur - Continue on nebivolol  Acute renal failure, unspecified - on CKD IIIb, multifactorial:  - In the setting of hypertensive emergency, multiple nephrotoxic medications, cocaine use, recent contrast.   -No findings on ultrasound or imaging, FENa less than 1% -Nephrology consulted, temporary right IJ HD catheter placed 01/26/2022 -ongoing, unclear if patient will have renal recovery at this point  Acute on chronic HFpEF:  - CXR showing vascular congestion and pulmonary edema.  BNP elevated to 618. -Continue volume management with dialysis. - HTN medications as above  Hyperkalemia:  -Currently being managed with dialysis  Acute hypoxic respiratory failure:  -Resolving with fluid management likely secondary to heart  failure and volume overload in the setting of renal failure  Thyroid nodule: Seen on CTA. TSH and free T4 wnl.  - Needs U/S as outpatient, consideration of further FNA based on characteristics.   Demand myocardial ischemia due to cocaine intoxication/HTN urgency in pt with known CAD. Not consistent with ACS, plan outpatient follow up and risk factor modification. hx NSTEMI and had LHC of 5/3 showing 100% stenosis of a small sub-branch of the right PDA. Medical management with DAPT, beta blocker, and statin was recommended.  - Continue aspirin and plavix, statin, holding BB w/+cocaine.  Cocaine use: +UDS.  - Cessation counseling provided.  IDT2DM: HbA1c 6.4%.  -Continue glargine 20u daily, and aspart 10u TID + mod SSI. Anticipate decreasing aspart as renal function worsens, but currently at inpatient goal and taking good po.     Anemia of CKD and iron deficiency: Hgb down but has stabilized. - Changed VTE ppx to heparin - Monitor CBC.  - IV iron ongoing  HLD:  - Continue statin  Prolonged QT interval: QTc of 514 on EKG.   - Avoid QT prolongation medication.  GERD:  - PPI  Anxiety:  - prn atarax. Discussed prohibitive risk of benzodiazepines - appears to have respiratory distress at bedside without hypoxia complaining of 'panic attack' - resolved with further discussion. No medical intervention necessary.  Obesity: Estimated body mass index is 35.5 kg/m as calculated from the following:   Height as of this encounter: 5\' 4"  (1.626 m).   Weight as of this encounter: 93.8 kg.  Subjective: Feeling more short of breath over past 24 hours. No fever. Stable, mild cough. No chest pain.   Objective: Vitals:   01/28/22 1958 01/28/22 2042 01/28/22 2350 01/29/22 0418  BP: 114/62  119/68 115/62  Pulse: 62  63 (!) 58  Resp: 19  14 15   Temp: 98.2 F (36.8 C)  98.5 F (36.9 C) 98.5 F (36.9 C)  TempSrc: Oral  Oral Oral  SpO2: 100% 97% 97% 100%  Weight:    93.8 kg  Height:      Gen:  61 y.o. female in no distress Pulm: Nonlabored breathing supplemental oxygen, scant bibasilar rales CV: Regular rate and rhythm. No murmur, rub, or gallop. No JVD, trace dependent edema. GI: Abdomen soft, non-tender, non-distended, with normoactive bowel sounds.  Ext: Warm, no deformities Skin: No rashes, lesions or ulcers on visualized skin. HD cath site c/d/i Neuro: Alert and oriented. No focal neurological deficits. Psych: Anxious, insight intact.    Data Personally reviewed:  CBC: Recent Labs  Lab 01/23/22 0537 01/24/22 0209 01/25/22 0057 01/27/22 0046 01/28/22 0200  WBC 4.0 4.5 5.4 5.9 5.0  HGB 10.2* 9.4* 9.7* 8.8* 9.2*  HCT 30.4* 27.9* 29.0* 26.9* 28.1*  MCV 88.1 89.1 89.8 89.1 89.8  PLT 193 156 180 169 833    Basic Metabolic Panel: Recent Labs  Lab 01/26/22 0255 01/27/22 0046 01/28/22 0200 01/28/22 1433 01/29/22 0554  NA 129* 131* 130* 129* 130*  K 4.9 5.0 5.4* 4.6 4.5  CL 101 100 96* 96* 97*  CO2 18* 19* 23 19* 23  GLUCOSE 156* 127* 152* 187* 123*  BUN 46* 55* 48* 53* 56*  CREATININE 6.32* 6.64* 5.45* 5.36* 4.93*  CALCIUM 8.1* 8.5* 8.8* 8.8* 8.8*  PHOS 4.7* 5.5* 5.1* 6.1* 6.3*    TSH: 1.374 Free T4: 0.61 UPr:Cr 6.06, FENa 0.82% UA: >300 protein, 50 glucose, 11-20 WBCs, no RBCs.  Renal U/S 6/7: Normal echogenicity. 1. No acute findings. 2. Incidentally imaged cholelithiasis.    CTA chest 6/5:  1. No acute aortic findings. 2. Mild cardiomegaly and left ventricular hypertrophy. 3. Solid 2.7 cm left thyroid nodule. Recommend thyroid US (ref: J Am Coll Radiol. 2015 Feb;12(2): 143-50). 4. Aortic Atherosclerosis (ICD10-I70.0). 5. Possible hepatic steatosis. 6. Cholelithiasis. 7. Mild subpleural reticular interstitial accentuation in the lungs, nonspecific. No substantial advancement compared to the appearance of the lung bases on 01/15/2020.  Family Communication: Friend at bedside  Disposition: Status is: Inpatient Remains inpatient appropriate  because: Renal failure requiring dialysis Planned Discharge Destination: [place], [place] 01/29/2022 7:40 AM Page by [email or web address]

## 2022-01-29 NOTE — Care Management Important Message (Signed)
Important Message  Patient Details  Name: Carol [name] MRN: [id number] Date of Birth: February 15, 1961   Medicare Important Message Given:  Yes     Shelda Altes 01/29/2022, 8:02 AM

## 2022-01-30 ENCOUNTER — Encounter (HOSPITAL_COMMUNITY): Payer: Self-pay | Admitting: Family Medicine

## 2022-01-30 ENCOUNTER — Other Ambulatory Visit: Payer: Self-pay

## 2022-01-30 DIAGNOSIS — I160 Hypertensive urgency: Secondary | ICD-10-CM | POA: Diagnosis not present

## 2022-01-30 LAB — GLUCOSE, CAPILLARY
Glucose-Capillary: 133 mg/dL — ABNORMAL HIGH (ref 70–99)
Glucose-Capillary: 139 mg/dL — ABNORMAL HIGH (ref 70–99)
Glucose-Capillary: 141 mg/dL — ABNORMAL HIGH (ref 70–99)
Glucose-Capillary: 144 mg/dL — ABNORMAL HIGH (ref 70–99)

## 2022-01-30 LAB — RENAL FUNCTION PANEL
Albumin: 2.4 g/dL — ABNORMAL LOW (ref 3.5–5.0)
Anion gap: 8 (ref 5–15)
BUN: 61 mg/dL — ABNORMAL HIGH (ref 6–20)
CO2: 21 mmol/L — ABNORMAL LOW (ref 22–32)
Calcium: 8.5 mg/dL — ABNORMAL LOW (ref 8.9–10.3)
Chloride: 100 mmol/L (ref 98–111)
Creatinine, Ser: 4.51 mg/dL — ABNORMAL HIGH (ref 0.44–1.00)
GFR, Estimated: 11 mL/min — ABNORMAL LOW (ref >60)
Glucose, Bld: 194 mg/dL — ABNORMAL HIGH (ref 70–99)
Phosphorus: 6.3 mg/dL — ABNORMAL HIGH (ref 2.5–4.6)
Potassium: 4 mmol/L (ref 3.5–5.1)
Sodium: 129 mmol/L — ABNORMAL LOW (ref 135–145)

## 2022-01-30 NOTE — Progress Notes (Signed)
Occupational Therapy Treatment Patient Details Name: Carol [name] MRN: [id number] DOB: 06-20-1961 Today's Date: 01/30/2022   History of present illness Patient is a 61 y/o female who presents on 01/22/22 with chest pain, SOB (in setting of cocaine withdrawal) and heart palpitations. Workup for HTN emergency and acute on chronic CHF. CXR-pulmonary edema. S/p RIJ temp HD cath placement 6/9. PMH includes anxiety, chronic back pain, depression, DM, HTN, PD, NSTEMI, cocaine use; of note, recent admission 5/13-5/17/23 for hypoglycemia, elevated BP and 5/3-12/24/21 for NSTEMI.   OT comments  Patient received in bed and stating she felt tired and sleepy. Patient willing to get out of bed to address mobility and self care tasks. Patient able to get to EOB with increased time and required mod assist to donn socks. Patient stood from EOB and ambulated towards sink and stated she felt she couldn't go any further due to being too tired. Patient was setup to perform grooming tasks seated on EOB and patient asked to return to supine. Patient's vital signs remained within normal limits during session. Acute OT to continue to follow.    Recommendations for follow up therapy are one component of a multi-disciplinary discharge planning process, led by the attending physician.  Recommendations may be updated based on patient status, additional functional criteria and insurance authorization.    Follow Up Recommendations  Home health OT    Assistance Recommended at Discharge Set up Supervision/Assistance  Patient can return home with the following  A little help with bathing/dressing/bathroom;Assistance with cooking/housework;Help with stairs or ramp for entrance;Assist for transportation   Equipment Recommendations  BSC/3in1    Recommendations for Other Services      Precautions / Restrictions Precautions Precautions: Fall Precaution Comments: watch BP Restrictions Weight Bearing Restrictions: No        Mobility Bed Mobility Overal bed mobility: Independent             General bed mobility comments: able to get to EOB and back to supine without assistance    Transfers Overall transfer level: Needs assistance Equipment used: Rolling walker (2 wheels) Transfers: Sit to/from Stand Sit to Stand: Min guard           General transfer comment: min guard due to lethargic and for safety     Balance Overall balance assessment: History of Falls, Mild deficits observed, not formally tested Sitting-balance support: Feet supported, No upper extremity supported Sitting balance-Leahy Scale: Good Sitting balance - Comments: able to perform grooming seated on EOB   Standing balance support: Bilateral upper extremity supported Standing balance-Leahy Scale: Fair Standing balance comment: patient weak when standing and performed limited standing                           ADL either performed or assessed with clinical judgement   ADL Overall ADL's : Needs assistance/impaired     Grooming: Wash/dry hands;Wash/dry face;Oral care;Sitting;Supervision/safety Grooming Details (indicate cue type and reason): performed seated on EOB with supervision due to lethargic             Lower Body Dressing: Moderate assistance;Sitting/lateral leans Lower Body Dressing Details (indicate cue type and reason): required mod assist on this date due to patient being lethargic               General ADL Comments: session limited due to lethargic    Extremity/Trunk Assessment              Vision  Perception     Praxis      Cognition Arousal/Alertness: Lethargic Behavior During Therapy: WFL for tasks assessed/performed Overall Cognitive Status: Within Functional Limits for tasks assessed                                 General Comments: patient lethargic with complaints of being tired and sleepy        Exercises      Shoulder Instructions        General Comments vital signs within normal ranges    Pertinent Vitals/ Pain       Pain Assessment Pain Assessment: Faces Faces Pain Scale: Hurts a little bit Pain Location: back Pain Descriptors / Indicators: Discomfort Pain Intervention(s): Monitored during session  Home Living                                          Prior Functioning/Environment              Frequency  Min 2X/week        Progress Toward Goals  OT Goals(current goals can now be found in the care plan section)  Progress towards OT goals: Progressing toward goals  Acute Rehab OT Goals Patient Stated Goal: go home OT Goal Formulation: With patient Time For Goal Achievement: 02/07/22 Potential to Achieve Goals: Good ADL Goals Pt Will Perform Upper Body Dressing: with modified independence;standing;sitting Pt Will Perform Lower Body Dressing: with modified independence;sitting/lateral leans;sit to/from stand Pt Will Perform Tub/Shower Transfer: Shower transfer;rolling walker;ambulating;3 in 1  Plan Discharge plan remains appropriate    Co-evaluation                 AM-PAC OT "6 Clicks" Daily Activity     Outcome Measure   Help from another person eating meals?: None Help from another person taking care of personal grooming?: None Help from another person toileting, which includes using toliet, bedpan, or urinal?: None Help from another person bathing (including washing, rinsing, drying)?: A Little Help from another person to put on and taking off regular upper body clothing?: None Help from another person to put on and taking off regular lower body clothing?: A Little 6 Click Score: 22    End of Session Equipment Utilized During Treatment: Rolling walker (2 wheels)  OT Visit Diagnosis: Unsteadiness on feet (R26.81);Other abnormalities of gait and mobility (R26.89);Muscle weakness (generalized) (M62.81)   Activity Tolerance Patient limited by lethargy    Patient Left in bed;with call bell/phone within reach   Nurse Communication Mobility status        Time: 3716-9678 OT Time Calculation (min): 21 min  Charges: OT General Charges $OT Visit: 1 Visit OT Treatments $Self Care/Home Management : 8-22 mins  Lodema Hong, [place]  Pager [phone] Office [place] 01/30/2022, 12:31 PM

## 2022-01-30 NOTE — Progress Notes (Signed)
Progress Note  Patient: Carol [name] LFY:[id number] DOB: Aug 01, 1961  DOA: 01/22/2022  DOS: 01/30/2022    Brief hospital course: Carol [name] is a 61 y.o. female with a history of IDT2DM, uncontrolled HTN, cocaine use, stage IIIb CKD, CAD s/p recent NSTEMI managed medically who presented 6/5 with chest pain and dyspnea. She was cocaine positive, appeared volume overloaded with dependent edema. Troponin was 50. CXR suggested interstitial edema. CTA chest showed no aortic dissection, CT head nonacute. Cardiology was consulted and the patient was admitted on nitroglycerin infusion with hypertensive urgency. Mild troponin elevation is felt to be due to demand ischemia, and with improvement in HTN, symptoms have resolved. IV lasix was started but stopped the day after admission. Unfortunately, she has developed oliguric AKI, with creatinine trending upward. 1.7 > 2.4 > 4 > 5. Nephrology is consulted and supportive treatment ongoing.  Assessment and Plan:  Uncontrolled HTN with HTN urgency:  Precipitated by cocaine use/abuse with medication noncompliance Nitro drip previously required and now discontinued - Continue amlodipine 10 mg, hydralazine 50mg  po TID, Imdur - Continue on nebivolol  Acute renal failure, unspecified - on CKD IIIb, multifactorial:  - In the setting of hypertensive emergency, multiple nephrotoxic medications, cocaine use, recent contrast.   -No findings on ultrasound or imaging, FENa less than 1% -Nephrology consulted, temporary right IJ HD catheter placed 01/26/2022 -ongoing, unclear if patient will have renal recovery at this point - UOP continues to improve somewhat; HD on hold for now  Acute on chronic HFpEF:  - CXR showing vascular congestion and pulmonary edema.  BNP elevated to 618. -Continue volume management with dialysis - will need to transition to diuretics if renal recovery continues - HTN medications as above  Hyperkalemia:  -Stabilizing - follow  closely now that HD is paused  Acute hypoxic respiratory failure:  -Resolving with fluid management likely secondary to heart failure and volume overload in the setting of renal failure  Thyroid nodule: Seen on CTA. TSH and free T4 wnl.  - Needs U/S as outpatient, consideration of further FNA based on characteristics.   Demand myocardial ischemia due to cocaine intoxication/HTN urgency in pt with known CAD. Not consistent with ACS, plan outpatient follow up and risk factor modification. hx NSTEMI and had LHC of 5/3 showing 100% stenosis of a small sub-branch of the right PDA. Medical management with DAPT, beta blocker, and statin was recommended.  - Continue aspirin and plavix, statin, holding BB w/+cocaine.  Cocaine use: +UDS.  - Cessation counseling provided.  IDT2DM: HbA1c 6.4%.  -Continue glargine 20u daily, and aspart 10u TID + mod SSI. Anticipate decreasing aspart as renal function worsens, but currently at inpatient goal and taking good po.     Anemia of CKD and iron deficiency: Hgb down but has stabilized. - Changed VTE ppx to heparin - Monitor CBC.  - IV iron ongoing  HLD:  - Continue statin  Prolonged QT interval: QTc of 514 on EKG.   - Avoid QT prolongation medication.  GERD:  - PPI  Anxiety:  - prn atarax. Discussed prohibitive risk of benzodiazepines - appears to have respiratory distress at bedside without hypoxia complaining of 'panic attack' - resolved with further discussion. No medical intervention necessary.  Obesity: Estimated body mass index is 37.24 kg/m as calculated from the following:   Height as of this encounter: 5\' 4"  (1.626 m).   Weight as of this encounter: 98.4 kg.  Subjective: Feeling more short of breath over past 24 hours. No fever.  Stable, mild cough. No chest pain.   Objective: Vitals:   01/29/22 1515 01/29/22 1952 01/29/22 2334 01/30/22 0528  BP: 109/66 123/64 (!) 146/78 118/73  Pulse: (!) 20 61 69 63  Resp: 18 18 17 14   Temp: 98.4  F (36.9 C) 98.2 F (36.8 C) 98 F (36.7 C) 98.5 F (36.9 C)  TempSrc: Oral Oral Oral Oral  SpO2: 95% 96% 96% 97%  Weight:    98.4 kg  Height:      Gen: 61 y.o. female in no distress Pulm: Nonlabored breathing supplemental oxygen, scant bibasilar rales CV: Regular rate and rhythm. No murmur, rub, or gallop. No JVD, trace dependent edema. GI: Abdomen soft, non-tender, non-distended, with normoactive bowel sounds.  Ext: Warm, no deformities Skin: No rashes, lesions or ulcers on visualized skin. HD cath site c/d/i Neuro: Alert and oriented. No focal neurological deficits. Psych: Anxious, insight intact.    Data Personally reviewed:  CBC: Recent Labs  Lab 01/24/22 0209 01/25/22 0057 01/27/22 0046 01/28/22 0200  WBC 4.5 5.4 5.9 5.0  HGB 9.4* 9.7* 8.8* 9.2*  HCT 27.9* 29.0* 26.9* 28.1*  MCV 89.1 89.8 89.1 89.8  PLT 156 180 169 009    Basic Metabolic Panel: Recent Labs  Lab 01/27/22 0046 01/28/22 0200 01/28/22 1433 01/29/22 0554 01/30/22 0049  NA 131* 130* 129* 130* 129*  K 5.0 5.4* 4.6 4.5 4.0  CL 100 96* 96* 97* 100  CO2 19* 23 19* 23 21*  GLUCOSE 127* 152* 187* 123* 194*  BUN 55* 48* 53* 56* 61*  CREATININE 6.64* 5.45* 5.36* 4.93* 4.51*  CALCIUM 8.5* 8.8* 8.8* 8.8* 8.5*  PHOS 5.5* 5.1* 6.1* 6.3* 6.3*    TSH: 1.374 Free T4: 0.61 UPr:Cr 6.06, FENa 0.82% UA: >300 protein, 50 glucose, 11-20 WBCs, no RBCs.  Renal U/S 6/7: Normal echogenicity. 1. No acute findings. 2. Incidentally imaged cholelithiasis.    CTA chest 6/5:  1. No acute aortic findings. 2. Mild cardiomegaly and left ventricular hypertrophy. 3. Solid 2.7 cm left thyroid nodule. Recommend thyroid US (ref: J Am Coll Radiol. 2015 Feb;12(2): 143-50). 4. Aortic Atherosclerosis (ICD10-I70.0). 5. Possible hepatic steatosis. 6. Cholelithiasis. 7. Mild subpleural reticular interstitial accentuation in the lungs, nonspecific. No substantial advancement compared to the appearance of the lung bases on  01/15/2020.  Family Communication: Friend at bedside  Disposition: Status is: Inpatient Remains inpatient appropriate because: Renal failure requiring dialysis Planned Discharge Destination: [place], [place] 01/30/2022 7:33 AM Page by [name]

## 2022-01-30 NOTE — Progress Notes (Signed)
Patient ID: Carol [name], female   DOB: Sep 27, 1960, 61 y.o.   MRN: [id number]  Assessment/Plan:  AKI/CKD stage IIIb, oliguric - multiple renal insults including cocaine, hypertensive urgency with rapid over correction and hypotension with concomitant ARB, as well as CIN from IV contrast +/- NSAIDs.  UOP had been picking up but none since yesterday and BUN/Cr continue to climb.  She has no uremic signs or symptoms.  Continue with current BP regimen.  Continue to hold ARB, HCTZ, and metformin.  Gave a dose of IV lasix on 6/11.   S/p temp HD catheter 01/26/22 and completed first HD session late last night/early morning 6/10.   Hold off on HD for now and will assess daily. She is starting to recover kidney function. UOP decent (1L Margo Aye). Continue to follow UOP and Scr. Hyperkalemia - Dose of IV lasix 160 mg x 1 on 6/11 and stopped Lokelma for now and will follow closely. HTN urgency - improved.  Continue with amlodipine and hydralazine.  Nebivolol back on +, ARB/HCTZ on hold due to AKI. Acute on chronic diastolic CHF - improved.  Lasix on hold due to AKI; will give PRN. Anemia of CKD stage IIIb - follow H/H and transfuse prn.  Hold off on ESA for now.  TSAT 7% and started IV iron.  Continue to follow. CAD - with demand ischemia.  Improved. Cocaine abuse - we discussed the risks to her health with ongoing use.  She reports that she had been clean for 20 years until this April.  Is willing to stop and move out of her current situation.  DM type 2- per primary.  S: No events overnight.  Mild SOB today. UOP still good.  O:BP 117/65 (BP Location: Right Arm)   Pulse 64   Temp 98.1 F (36.7 C) (Oral)   Resp 15   Ht 5\' 4"  (1.626 m)   Wt 98.4 kg   SpO2 98%   BMI 37.24 kg/m   Intake/Output Summary (Last 24 hours) at 01/30/2022 0913 Last data filed at 01/30/2022 0800 Gross per 24 hour  Intake 1425.12 ml  Output 975 ml  Net 450.12 ml   Intake/Output: I/O last 3 completed shifts: In: 1065.1  [P.O.:620; I.V.:130.1; IV Piggyback:315] Out: 2075 [Urine:2075]  Intake/Output this shift:  Total I/O In: 360 [P.O.:360] Out: -  Weight change: 4.6 kg Gen:NAD CVS: RRR Resp: CTA Abd: +Bs, soft, NT/ND Ext: trace pretibial edema  Recent Labs  Lab 01/25/22 0057 01/26/22 0255 01/27/22 0046 01/28/22 0200 01/28/22 1433 01/29/22 0554 01/30/22 0049  NA 132* 129* 131* 130* 129* 130* 129*  K 4.2 4.9 5.0 5.4* 4.6 4.5 4.0  CL 103 101 100 96* 96* 97* 100  CO2 20* 18* 19* 23 19* 23 21*  GLUCOSE 132* 156* 127* 152* 187* 123* 194*  BUN 39* 46* 55* 48* 53* 56* 61*  CREATININE 5.00* 6.32* 6.64* 5.45* 5.36* 4.93* 4.51*  ALBUMIN 2.2* 2.4* 2.4* 2.5* 2.6* 2.3* 2.4*  CALCIUM 8.0* 8.1* 8.5* 8.8* 8.8* 8.8* 8.5*  PHOS  --  4.7* 5.5* 5.1* 6.1* 6.3* 6.3*  AST 21  --   --   --   --   --   --   ALT 10  --   --   --   --   --   --    Liver Function Tests: Recent Labs  Lab 01/25/22 0057 01/26/22 0255 01/28/22 1433 01/29/22 0554 01/30/22 0049  AST 21  --   --   --   --  ALT 10  --   --   --   --   ALKPHOS 94  --   --   --   --   BILITOT 0.6  --   --   --   --   PROT 5.6*  --   --   --   --   ALBUMIN 2.2*   < > 2.6* 2.3* 2.4*   < > = values in this interval not displayed.   No results for input(s): "LIPASE", "AMYLASE" in the last 168 hours. No results for input(s): "AMMONIA" in the last 168 hours. CBC: Recent Labs  Lab 01/24/22 0209 01/25/22 0057 01/27/22 0046 01/28/22 0200  WBC 4.5 5.4 5.9 5.0  HGB 9.4* 9.7* 8.8* 9.2*  HCT 27.9* 29.0* 26.9* 28.1*  MCV 89.1 89.8 89.1 89.8  PLT 156 180 169 161   Cardiac Enzymes: No results for input(s): "CKTOTAL", "CKMB", "CKMBINDEX", "TROPONINI" in the last 168 hours. CBG: Recent Labs  Lab 01/29/22 0611 01/29/22 1200 01/29/22 1706 01/29/22 2106 01/30/22 0638  GLUCAP 140* 144* 133* 120* 133*    Iron Studies:  No results for input(s): "IRON", "TIBC", "TRANSFERRIN", "FERRITIN" in the last 72 hours.  Studies/Results: No results  found.   amLODipine  10 mg Oral q morning   aspirin EC  81 mg Oral Daily   atorvastatin  80 mg Oral q morning   Chlorhexidine Gluconate Cloth  6 each Topical Q0600   clopidogrel  75 mg Oral q morning   gabapentin  100 mg Oral q morning   heparin injection (subcutaneous)  5,000 Units Subcutaneous Q8H   hydrALAZINE  50 mg Oral Q8H   insulin aspart  0-15 Units Subcutaneous TID WC   insulin aspart  10 Units Subcutaneous TID WC   insulin glargine-yfgn  20 Units Subcutaneous Daily   loratadine  10 mg Oral Daily   mometasone-formoterol  2 puff Inhalation BID   nebivolol  20 mg Oral Daily   pantoprazole  40 mg Oral q morning   QUEtiapine  300 mg Oral QHS   sertraline  50 mg Oral QPM   Vitamin D (Ergocalciferol)  50,000 Units Oral Q Mon    BMET    Component Value Date/Time   NA 129 (L) 01/30/2022 0049   K 4.0 01/30/2022 0049   CL 100 01/30/2022 0049   CO2 21 (L) 01/30/2022 0049   GLUCOSE 194 (H) 01/30/2022 0049   BUN 61 (H) 01/30/2022 0049   CREATININE 4.51 (H) 01/30/2022 0049   CREATININE 1.36 (H) 11/21/2020 1451   CALCIUM 8.5 (L) 01/30/2022 0049   GFRNONAA 11 (L) 01/30/2022 0049   GFRNONAA 43 (L) 11/21/2020 1451   GFRAA 50 (L) 11/21/2020 1451   CBC    Component Value Date/Time   WBC 5.0 01/28/2022 0200   RBC 3.13 (L) 01/28/2022 0200   HGB 9.2 (L) 01/28/2022 0200   HCT 28.1 (L) 01/28/2022 0200   PLT 161 01/28/2022 0200   MCV 89.8 01/28/2022 0200   MCH 29.4 01/28/2022 0200   MCHC 32.7 01/28/2022 0200   RDW 13.6 01/28/2022 0200   LYMPHSABS 1.1 12/30/2021 2342   MONOABS 0.6 12/30/2021 2342   EOSABS 0.1 12/30/2021 2342   BASOSABS 0.0 12/30/2021 2342

## 2022-01-31 DIAGNOSIS — I160 Hypertensive urgency: Secondary | ICD-10-CM | POA: Diagnosis not present

## 2022-01-31 LAB — RENAL FUNCTION PANEL
Albumin: 2.4 g/dL — ABNORMAL LOW (ref 3.5–5.0)
Anion gap: 7 (ref 5–15)
BUN: 66 mg/dL — ABNORMAL HIGH (ref 6–20)
CO2: 24 mmol/L (ref 22–32)
Calcium: 8.5 mg/dL — ABNORMAL LOW (ref 8.9–10.3)
Chloride: 102 mmol/L (ref 98–111)
Creatinine, Ser: 4.04 mg/dL — ABNORMAL HIGH (ref 0.44–1.00)
GFR, Estimated: 12 mL/min — ABNORMAL LOW (ref >60)
Glucose, Bld: 124 mg/dL — ABNORMAL HIGH (ref 70–99)
Phosphorus: 5.3 mg/dL — ABNORMAL HIGH (ref 2.5–4.6)
Potassium: 4.2 mmol/L (ref 3.5–5.1)
Sodium: 133 mmol/L — ABNORMAL LOW (ref 135–145)

## 2022-01-31 LAB — GLUCOSE, CAPILLARY
Glucose-Capillary: 112 mg/dL — ABNORMAL HIGH (ref 70–99)
Glucose-Capillary: 157 mg/dL — ABNORMAL HIGH (ref 70–99)
Glucose-Capillary: 78 mg/dL (ref 70–99)
Glucose-Capillary: 169 mg/dL — ABNORMAL HIGH (ref 70–99)

## 2022-01-31 NOTE — Progress Notes (Signed)
Progress Note  Patient: Carol [name] UMP:[id number] DOB: 11-Nov-1960  DOA: 01/22/2022  DOS: 01/31/2022    Brief hospital course: Carol [name] is a 61 y.o. female with a history of IDT2DM, uncontrolled HTN, cocaine use, stage IIIb CKD, CAD s/p recent NSTEMI managed medically who presented 6/5 with chest pain and dyspnea. She was cocaine positive, appeared volume overloaded with dependent edema. Troponin was 50. CXR suggested interstitial edema. CTA chest showed no aortic dissection, CT head nonacute. Cardiology was consulted and the patient was admitted on nitroglycerin infusion with hypertensive urgency. Mild troponin elevation is felt to be due to demand ischemia, and with improvement in HTN, symptoms have resolved. IV lasix was started but stopped the day after admission. Unfortunately, she has developed oliguric AKI, with creatinine trending upward. Nephrology is consulted and supportive treatment ongoing - transiently required HD but renal function now improving somewhat - holding off on further HD in the interim.  Assessment and Plan:  Acute renal failure, unspecified - on CKD IIIb, multifactorial:  - In the setting of hypertensive emergency, multiple nephrotoxic medications, cocaine use, recent contrast.   -No findings on ultrasound or imaging, FENa less than 1% -Nephrology consulted, temporary right IJ HD catheter placed 01/26/2022 -ongoing, unclear if patient will have renal recovery at this point - UOP continues to improve somewhat; HD on hold for now  Uncontrolled HTN with HTN urgency, resolved Precipitated by cocaine use/abuse with medication noncompliance Nitro drip previously required and now discontinued - Continue amlodipine 10 mg, hydralazine 50mg  po TID, Imdur - Continue on nebivolol  Acute on chronic HFpEF:  - CXR showing vascular congestion and pulmonary edema.  BNP elevated to 618. -Continue volume management with dialysis - will need to transition to  diuretics if renal recovery continues - HTN medications as above  Hyperkalemia:  -Stabilizing - follow closely now that HD is paused  Acute hypoxic respiratory failure:  -Resolving with fluid management likely secondary to heart failure and volume overload in the setting of renal failure  Thyroid nodule: Seen on CTA. TSH and free T4 wnl.  - Needs U/S as outpatient, consideration of further FNA based on characteristics.   Demand myocardial ischemia due to cocaine intoxication/HTN urgency in pt with known CAD. Not consistent with ACS, plan outpatient follow up and risk factor modification. hx NSTEMI and had LHC of 5/3 showing 100% stenosis of a small sub-branch of the right PDA. Medical management with DAPT, beta blocker, and statin was recommended.  - Continue aspirin and plavix, statin, holding BB w/+cocaine.  Cocaine use: +UDS.  - Cessation counseling provided.  IDT2DM: HbA1c 6.4%.  -Continue glargine 20u daily, and aspart 10u TID + mod SSI. Anticipate decreasing aspart as renal function worsens, but currently at inpatient goal and taking good po.     Anemia of CKD and iron deficiency: Hgb down but has stabilized. - Changed VTE ppx to heparin - Monitor CBC.  - IV iron completed  HLD:  - Continue statin  Prolonged QT interval: QTc of 514 on EKG.   - Avoid QT prolongation medication.  GERD:  - PPI  Anxiety:  - prn atarax. Discussed prohibitive risk of benzodiazepines - appears to have respiratory distress at bedside without hypoxia complaining of 'panic attack' - resolved with further discussion. No medical intervention necessary.  Obesity: Estimated body mass index is 35.87 kg/m as calculated from the following:   Height as of this encounter: 5\' 4"  (1.626 m).   Weight as of this encounter: 94.8 kg.  Subjective: No acute issues/events overnight, UOP improving   Objective: Vitals:   01/30/22 2006 01/30/22 2028 01/31/22 0011 01/31/22 0354  BP: 125/77  113/63 140/82   Pulse: 60  61 63  Resp: 16  19 20   Temp: 99.1 F (37.3 C)  98.3 F (36.8 C) 98.3 F (36.8 C)  TempSrc: Oral  Oral Oral  SpO2: 100% 94% 99%   Weight:    94.8 kg  Height:      Gen: 61 y.o. female in no distress Pulm: Nonlabored breathing supplemental oxygen, scant bibasilar rales CV: Regular rate and rhythm. No murmur, rub, or gallop. No JVD, trace dependent edema. GI: Abdomen soft, non-tender, non-distended, with normoactive bowel sounds.  Ext: Warm, no deformities Skin: No rashes, lesions or ulcers on visualized skin. HD cath site c/d/i Neuro: Alert and oriented. No focal neurological deficits. Psych: Anxious, insight intact.    Data Personally reviewed:  CBC: Recent Labs  Lab 01/25/22 0057 01/27/22 0046 01/28/22 0200  WBC 5.4 5.9 5.0  HGB 9.7* 8.8* 9.2*  HCT 29.0* 26.9* 28.1*  MCV 89.8 89.1 89.8  PLT 180 169 992    Basic Metabolic Panel: Recent Labs  Lab 01/28/22 0200 01/28/22 1433 01/29/22 0554 01/30/22 0049 01/31/22 0415  NA 130* 129* 130* 129* 133*  K 5.4* 4.6 4.5 4.0 4.2  CL 96* 96* 97* 100 102  CO2 23 19* 23 21* 24  GLUCOSE 152* 187* 123* 194* 124*  BUN 48* 53* 56* 61* 66*  CREATININE 5.45* 5.36* 4.93* 4.51* 4.04*  CALCIUM 8.8* 8.8* 8.8* 8.5* 8.5*  PHOS 5.1* 6.1* 6.3* 6.3* 5.3*    TSH: 1.374 Free T4: 0.61 UPr:Cr 6.06, FENa 0.82% UA: >300 protein, 50 glucose, 11-20 WBCs, no RBCs.  Renal U/S 6/7: Normal echogenicity. 1. No acute findings. 2. Incidentally imaged cholelithiasis.    CTA chest 6/5:  1. No acute aortic findings. 2. Mild cardiomegaly and left ventricular hypertrophy. 3. Solid 2.7 cm left thyroid nodule. Recommend thyroid US (ref: J Am Coll Radiol. 2015 Feb;12(2): 143-50). 4. Aortic Atherosclerosis (ICD10-I70.0). 5. Possible hepatic steatosis. 6. Cholelithiasis. 7. Mild subpleural reticular interstitial accentuation in the lungs, nonspecific. No substantial advancement compared to the appearance of the lung bases on  01/15/2020.  Family Communication: Friend at bedside  Disposition: Status is: Inpatient Remains inpatient appropriate because: Renal failure requiring dialysis Planned Discharge Destination: [place], [place] 01/31/2022 7:26 AM Page by [email or web address]

## 2022-01-31 NOTE — Plan of Care (Signed)
  Problem: Fluid Volume: Goal: Ability to maintain a balanced intake and output will improve Outcome: Not Progressing   

## 2022-01-31 NOTE — Progress Notes (Signed)
Patient ID: Carol [name], female   DOB: January 22, 1961, 61 y.o.   MRN: [id number]  Assessment/Plan:  AKI/CKD stage IIIb, oliguric - multiple renal insults including cocaine, hypertensive urgency with rapid over correction and hypotension with concomitant ARB, as well as CIN from IV contrast +/- NSAIDs.  UOP had been picking up but none since yesterday and BUN/Cr continue to climb.  She has no uremic signs or symptoms.  Continue with current BP regimen.  Continue to hold ARB, HCTZ, and metformin.  Gave a dose of IV lasix on 6/11.   S/p temp HD catheter 01/26/22 and completed first HD session late last night/early morning 6/10.   Continue to hold HD for now and assess daily. She continues to slowly recover kidney function. Can always f/u with [facility] in 2 weeks to ensure back to baseline; will pull catheter when primary team decides close to d/c.  Hyperkalemia - Dose of IV lasix 160 mg x 1 on 6/11 and stopped Lokelma for now and will follow closely. HTN urgency - improved.  Continue with amlodipine and hydralazine.  Nebivolol back on + ARB/HCTZ on hold due to AKI. Acute on chronic diastolic CHF - improved.  Lasix on hold due to AKI; will give PRN. Anemia of CKD stage IIIb - follow H/H and transfuse prn.  Hold off on ESA for now.  TSAT 7% and started IV iron.  Continue to follow. CAD - with demand ischemia.  Improved. Cocaine abuse - we discussed the risks to her health with ongoing use.  She reports that she had been clean for 20 years until this April.  Is willing to stop and move out of her current situation.  DM type 2- per primary.  S: No events overnight.  UOP still good.  O:BP 116/66 (BP Location: Right Arm)   Pulse 66   Temp 98.7 F (37.1 C) (Oral)   Resp 16   Ht 5\' 4"  (1.626 m)   Wt 94.8 kg   SpO2 98%   BMI 35.87 kg/m   Intake/Output Summary (Last 24 hours) at 01/31/2022 1142 Last data filed at 01/31/2022 0631 Gross per 24 hour  Intake 1045 ml  Output 700 ml  Net 345 ml    Intake/Output: I/O last 3 completed shifts: In: 1620 [P.O.:1200; Other:100; IV Piggyback:320] Out: 700 [Urine:700]  Intake/Output this shift:  No intake/output data recorded. Weight change: -3.6 kg Gen:NAD CVS: RRR Resp: CTA Abd: +Bs, soft, NT/ND Ext: trace pretibial edema Access: RIJ temp  Recent Labs  Lab 01/25/22 0057 01/26/22 0255 01/27/22 0046 01/28/22 0200 01/28/22 1433 01/29/22 0554 01/30/22 0049 01/31/22 0415  NA 132* 129* 131* 130* 129* 130* 129* 133*  K 4.2 4.9 5.0 5.4* 4.6 4.5 4.0 4.2  CL 103 101 100 96* 96* 97* 100 102  CO2 20* 18* 19* 23 19* 23 21* 24  GLUCOSE 132* 156* 127* 152* 187* 123* 194* 124*  BUN 39* 46* 55* 48* 53* 56* 61* 66*  CREATININE 5.00* 6.32* 6.64* 5.45* 5.36* 4.93* 4.51* 4.04*  ALBUMIN 2.2* 2.4* 2.4* 2.5* 2.6* 2.3* 2.4* 2.4*  CALCIUM 8.0* 8.1* 8.5* 8.8* 8.8* 8.8* 8.5* 8.5*  PHOS  --  4.7* 5.5* 5.1* 6.1* 6.3* 6.3* 5.3*  AST 21  --   --   --   --   --   --   --   ALT 10  --   --   --   --   --   --   --    Liver  Function Tests: Recent Labs  Lab 01/25/22 0057 01/26/22 0255 01/29/22 0554 01/30/22 0049 01/31/22 0415  AST 21  --   --   --   --   ALT 10  --   --   --   --   ALKPHOS 94  --   --   --   --   BILITOT 0.6  --   --   --   --   PROT 5.6*  --   --   --   --   ALBUMIN 2.2*   < > 2.3* 2.4* 2.4*   < > = values in this interval not displayed.   No results for input(s): "LIPASE", "AMYLASE" in the last 168 hours. No results for input(s): "AMMONIA" in the last 168 hours. CBC: Recent Labs  Lab 01/25/22 0057 01/27/22 0046 01/28/22 0200  WBC 5.4 5.9 5.0  HGB 9.7* 8.8* 9.2*  HCT 29.0* 26.9* 28.1*  MCV 89.8 89.1 89.8  PLT 180 169 161   Cardiac Enzymes: No results for input(s): "CKTOTAL", "CKMB", "CKMBINDEX", "TROPONINI" in the last 168 hours. CBG: Recent Labs  Lab 01/30/22 0638 01/30/22 1130 01/30/22 1612 01/30/22 2200 01/31/22 0609  GLUCAP 133* 139* 141* 144* 112*    Iron Studies:  No results for input(s):  "IRON", "TIBC", "TRANSFERRIN", "FERRITIN" in the last 72 hours.  Studies/Results: No results found.   amLODipine  10 mg Oral q morning   aspirin EC  81 mg Oral Daily   atorvastatin  80 mg Oral q morning   Chlorhexidine Gluconate Cloth  6 each Topical Q0600   clopidogrel  75 mg Oral q morning   gabapentin  100 mg Oral q morning   heparin injection (subcutaneous)  5,000 Units Subcutaneous Q8H   hydrALAZINE  50 mg Oral Q8H   insulin aspart  0-15 Units Subcutaneous TID WC   insulin aspart  10 Units Subcutaneous TID WC   insulin glargine-yfgn  20 Units Subcutaneous Daily   loratadine  10 mg Oral Daily   mometasone-formoterol  2 puff Inhalation BID   nebivolol  20 mg Oral Daily   pantoprazole  40 mg Oral q morning   QUEtiapine  300 mg Oral QHS   sertraline  50 mg Oral QPM   Vitamin D (Ergocalciferol)  50,000 Units Oral Q Mon    BMET    Component Value Date/Time   NA 133 (L) 01/31/2022 0415   K 4.2 01/31/2022 0415   CL 102 01/31/2022 0415   CO2 24 01/31/2022 0415   GLUCOSE 124 (H) 01/31/2022 0415   BUN 66 (H) 01/31/2022 0415   CREATININE 4.04 (H) 01/31/2022 0415   CREATININE 1.36 (H) 11/21/2020 1451   CALCIUM 8.5 (L) 01/31/2022 0415   GFRNONAA 12 (L) 01/31/2022 0415   GFRNONAA 43 (L) 11/21/2020 1451   GFRAA 50 (L) 11/21/2020 1451   CBC    Component Value Date/Time   WBC 5.0 01/28/2022 0200   RBC 3.13 (L) 01/28/2022 0200   HGB 9.2 (L) 01/28/2022 0200   HCT 28.1 (L) 01/28/2022 0200   PLT 161 01/28/2022 0200   MCV 89.8 01/28/2022 0200   MCH 29.4 01/28/2022 0200   MCHC 32.7 01/28/2022 0200   RDW 13.6 01/28/2022 0200   LYMPHSABS 1.1 12/30/2021 2342   MONOABS 0.6 12/30/2021 2342   EOSABS 0.1 12/30/2021 2342   BASOSABS 0.0 12/30/2021 2342

## 2022-02-01 DIAGNOSIS — I160 Hypertensive urgency: Secondary | ICD-10-CM | POA: Diagnosis not present

## 2022-02-01 LAB — GLUCOSE, CAPILLARY
Glucose-Capillary: 113 mg/dL — ABNORMAL HIGH (ref 70–99)
Glucose-Capillary: 137 mg/dL — ABNORMAL HIGH (ref 70–99)
Glucose-Capillary: 243 mg/dL — ABNORMAL HIGH (ref 70–99)
Glucose-Capillary: 105 mg/dL — ABNORMAL HIGH (ref 70–99)

## 2022-02-01 LAB — RENAL FUNCTION PANEL
Albumin: 2.4 g/dL — ABNORMAL LOW (ref 3.5–5.0)
Anion gap: 11 (ref 5–15)
BUN: 70 mg/dL — ABNORMAL HIGH (ref 6–20)
CO2: 23 mmol/L (ref 22–32)
Calcium: 9 mg/dL (ref 8.9–10.3)
Chloride: 100 mmol/L (ref 98–111)
Creatinine, Ser: 3.96 mg/dL — ABNORMAL HIGH (ref 0.44–1.00)
GFR, Estimated: 12 mL/min — ABNORMAL LOW (ref >60)
Glucose, Bld: 106 mg/dL — ABNORMAL HIGH (ref 70–99)
Phosphorus: 4.7 mg/dL — ABNORMAL HIGH (ref 2.5–4.6)
Potassium: 4.2 mmol/L (ref 3.5–5.1)
Sodium: 134 mmol/L — ABNORMAL LOW (ref 135–145)

## 2022-02-01 MED ORDER — BACITRACIN-NEOMYCIN-POLYMYXIN OINTMENT TUBE
TOPICAL_OINTMENT | Freq: Three times a day (TID) | CUTANEOUS | Status: DC
  Administered 2022-02-01: 1 via TOPICAL
  Filled 2022-02-01: qty 14

## 2022-02-01 NOTE — Care Management Important Message (Signed)
Important Message  Patient Details  Name: Greydis Stlouis MRN: [id number] Date of Birth: 03/02/1961   Medicare Important Message Given:  Yes     Shelda Altes 02/01/2022, 7:54 AM

## 2022-02-01 NOTE — Progress Notes (Addendum)
Progress Note  Patient: Carol [name] [id number]:[id number] DOB: [birth year]  DOA: 01/22/2022  DOS: 02/01/2022    Brief hospital course: Jazalyn Mondor is a 61 y.o. female with a history of IDT2DM, uncontrolled HTN, cocaine use, stage IIIb CKD, CAD s/p recent NSTEMI managed medically who presented 6/5 with chest pain and dyspnea. She was cocaine positive, appeared volume overloaded with dependent edema. Troponin was 50. CXR suggested interstitial edema. CTA chest showed no aortic dissection, CT head nonacute. Cardiology was consulted and the patient was admitted on nitroglycerin infusion with hypertensive urgency. Mild troponin elevation is felt to be due to demand ischemia, and with improvement in HTN, symptoms have resolved. IV lasix was started but stopped the day after admission. Unfortunately, she has developed oliguric AKI, with creatinine trending upward. Nephrology is consulted and supportive treatment ongoing - transiently required HD but renal function now improving somewhat - holding off on further HD in the interim.  Assessment and Plan:  Acute renal failure, unspecified - on CKD IIIb, multifactorial, improving:  - In the setting of hypertensive emergency, multiple nephrotoxic medications, cocaine use, recent contrast.   -No findings on ultrasound or imaging, FENa less than 1% -Nephrology consulted -Temporary right IJ HD catheter placed 01/26/2022 -transiently requiring hemodialysis -UOP continues to improve somewhat; HD on hold for now  Uncontrolled HTN with HTN urgency, resolved Precipitated by cocaine use/abuse with medication noncompliance Nitro drip previously required and now discontinued - Continue amlodipine 10 mg, hydralazine 50mg  po TID, Imdur - Continue on nebivolol  Acute on chronic HFpEF, resolved:  - CXR showing vascular congestion and pulmonary edema. BNP elevated to 618 in the setting of renal failure. -Continue volume management with dialysis - will need to  transition to diuretics if renal recovery continues - HTN medications as above  Left AC IV infiltration and wound  -Not present on admission -Topical Neosporin 3 times daily x1 week, follow clinically  Hyperkalemia:  -Stabilizing - follow closely now that HD is paused  Acute hypoxic respiratory failure:  -Resolving with fluid management likely secondary to heart failure and volume overload in the setting of renal failure  Thyroid nodule:  -Incidentally noted on CTA. TSH and free T4 wnl.  - Needs U/S as outpatient, consideration of further FNA based on characteristics.   Demand myocardial ischemia due to cocaine intoxication/HTN urgency in pt with known CAD.  -Not consistent with ACS, plan outpatient follow up and risk factor modification. hx NSTEMI and had LHC of 5/3 showing 100% stenosis of a small sub-branch of the right PDA. Medical management with DAPT, beta blocker, and statin was recommended.  - Continue aspirin and plavix, statin, -initially holding BB w/+cocaine status but not cleared.  Cocaine use:  +UDS.  - Cessation counseling provided.  IDT2DM, well controlled:  HbA1c 6.4%.  -Continue glargine 20u daily, and aspart 10u TID + mod SSI. Anticipate decreasing aspart as renal function worsens, but currently at inpatient goal and taking good po.     Anemia of CKD and iron deficiency: Hgb down but has stabilized. - Changed VTE ppx to heparin - Monitor CBC.  - IV iron completed  HLD:  - Continue statin  Prolonged QT interval: QTc of 514 on EKG.   - Avoid QT prolongation medication.  GERD:  - PPI  Anxiety:  -Improving with resolving respiratory/volume status as above  Obesity: Estimated body mass index is 35.87 kg/m as calculated from the following:   Height as of this encounter: 5\' 4"  (1.626 m).   Weight  as of this encounter: 94.8 kg.  Subjective: Overnight left AC IV appears to have become somewhat irritated and ultimately dislodged.  Hard erythematous nodule  noted at left [facility] previous IV site, will place topical antibiotic in this area precautionary.  Objective: Vitals:   01/31/22 2019 01/31/22 2117 01/31/22 2340 02/01/22 0417  BP:  117/63 111/67 109/66  Pulse:  68 61 62  Resp:  20 17 18   Temp:  98.5 F (36.9 C) 98.4 F (36.9 C) 98.4 F (36.9 C)  TempSrc:  Oral Oral Oral  SpO2: 95% 97% 98% 95%  Weight:      Height:       Gen: 61 y.o. female in no distress Pulm: Without overt wheezes rales or rhonchi CV: Regular rate and rhythm. No murmur, rub, or gallop. No JVD, trace dependent edema. GI: Abdomen soft, non-tender, non-distended, with normoactive bowel sounds.  Ext: Warm, no deformities Skin: No rashes, lesions or ulcers on visualized skin. HD cath site c/d/i Neuro: Alert and oriented. No focal neurological deficits. Psych: Anxious, insight intact.    Data Personally reviewed:  CBC: Recent Labs  Lab 01/27/22 0046 01/28/22 0200  WBC 5.9 5.0  HGB 8.8* 9.2*  HCT 26.9* 28.1*  MCV 89.1 89.8  PLT 169 837    Basic Metabolic Panel: Recent Labs  Lab 01/28/22 1433 01/29/22 0554 01/30/22 0049 01/31/22 0415 02/01/22 0605  NA 129* 130* 129* 133* 134*  K 4.6 4.5 4.0 4.2 4.2  CL 96* 97* 100 102 100  CO2 19* 23 21* 24 23  GLUCOSE 187* 123* 194* 124* 106*  BUN 53* 56* 61* 66* 70*  CREATININE 5.36* 4.93* 4.51* 4.04* 3.96*  CALCIUM 8.8* 8.8* 8.5* 8.5* 9.0  PHOS 6.1* 6.3* 6.3* 5.3* 4.7*    TSH: 1.374 Free T4: 0.61 UPr:Cr 6.06, FENa 0.82% UA: >300 protein, 50 glucose, 11-20 WBCs, no RBCs.  Renal U/S 6/7: Normal echogenicity. 1. No acute findings. 2. Incidentally imaged cholelithiasis.    CTA chest 6/5:  1. No acute aortic findings. 2. Mild cardiomegaly and left ventricular hypertrophy. 3. Solid 2.7 cm left thyroid nodule. Recommend thyroid US (ref: J Am Coll Radiol. 2015 Feb;12(2): 143-50). 4. Aortic Atherosclerosis (ICD10-I70.0). 5. Possible hepatic steatosis. 6. Cholelithiasis. 7. Mild subpleural reticular interstitial  accentuation in the lungs, nonspecific. No substantial advancement compared to the appearance of the lung bases on 01/15/2020.  Family Communication: Friend at bedside  Disposition: Status is: Inpatient Remains inpatient appropriate because: Renal failure requiring dialysis Planned Discharge Destination: [place], [place] 02/01/2022 7:54 AM Page by [email or web address]

## 2022-02-01 NOTE — Progress Notes (Signed)
Patient ID: Carol [name], female   DOB: 09-04-1960, 61 y.o.   MRN: [id number]  Assessment/Plan:  AKI/CKD stage IIIb, oliguric - multiple renal insults including cocaine, hypertensive urgency with rapid over correction and hypotension with concomitant ARB, as well as CIN from IV contrast +/- NSAIDs.  UOP had been picking up but none since yesterday and BUN/Cr continue to climb.  She has no uremic signs or symptoms.  Continue with current BP regimen.  Continue to hold ARB, HCTZ, and metformin.  Gave a dose of IV lasix on 6/11.   S/p temp HD catheter 01/26/22 and completed first HD session late last night/early morning 6/10.   Continue to hold HD for now and assess daily. She was slowly recovering kidney function but it has not improved as much as I'd like to see. I will have the nurse place TED stockings to help mobilize fluid as she still has signif lower ext swelling.  May also take time to get back to her BL; will need to f/u with [facility] in 2 weeks. Will pull catheter when primary team decides close to d/c.  Hyperkalemia - Dose of IV lasix 160 mg x 1 on 6/11 and stopped Lokelma for now and following closely. HTN urgency - improved.  Continue with amlodipine and hydralazine.  Nebivolol back on + ARB/HCTZ on hold due to AKI. Acute on chronic diastolic CHF - improved.  Lasix on hold due to AKI; will give PRN. Anemia of CKD stage IIIb - follow H/H and transfuse prn.  Hold off on ESA for now.  TSAT 7% and started IV iron.  Continue to follow. CAD - with demand ischemia.  Improved. Cocaine abuse - we discussed the risks to her health with ongoing use.  She reports that she had been clean for 20 years until this April.  Is willing to stop and move out of her current situation.  DM type 2- per primary.  S: No events overnight.  UOP still good but not as much as before. Occasionally gets short of breath. Good appetite. Main complaints are neck and back pain.  O:BP 135/80 (BP Location: Right Arm)    Pulse 69   Temp 98.2 F (36.8 C) (Oral)   Resp 18   Ht 5\' 4"  (1.626 m)   Wt 94.8 kg   SpO2 97%   BMI 35.87 kg/m   Intake/Output Summary (Last 24 hours) at 02/01/2022 1138 Last data filed at 02/01/2022 0804 Gross per 24 hour  Intake 420 ml  Output --  Net 420 ml   Intake/Output: I/O last 3 completed shifts: In: 85 [P.O.:660; Other:100; IV Piggyback:105] Out: 700 [Urine:700]  Intake/Output this shift:  Total I/O In: 240 [P.O.:240] Out: -  Weight change:  Gen:NAD CVS: RRR Resp: CTA Abd: +Bs, soft, NT/ND Ext: trace pretibial edema Access: RIJ temp  Recent Labs  Lab 01/27/22 0046 01/28/22 0200 01/28/22 1433 01/29/22 0554 01/30/22 0049 01/31/22 0415 02/01/22 0605  NA 131* 130* 129* 130* 129* 133* 134*  K 5.0 5.4* 4.6 4.5 4.0 4.2 4.2  CL 100 96* 96* 97* 100 102 100  CO2 19* 23 19* 23 21* 24 23  GLUCOSE 127* 152* 187* 123* 194* 124* 106*  BUN 55* 48* 53* 56* 61* 66* 70*  CREATININE 6.64* 5.45* 5.36* 4.93* 4.51* 4.04* 3.96*  ALBUMIN 2.4* 2.5* 2.6* 2.3* 2.4* 2.4* 2.4*  CALCIUM 8.5* 8.8* 8.8* 8.8* 8.5* 8.5* 9.0  PHOS 5.5* 5.1* 6.1* 6.3* 6.3* 5.3* 4.7*   Liver Function Tests: Recent  Labs  Lab 01/30/22 0049 01/31/22 0415 02/01/22 0605  ALBUMIN 2.4* 2.4* 2.4*   No results for input(s): "LIPASE", "AMYLASE" in the last 168 hours. No results for input(s): "AMMONIA" in the last 168 hours. CBC: Recent Labs  Lab 01/27/22 0046 01/28/22 0200  WBC 5.9 5.0  HGB 8.8* 9.2*  HCT 26.9* 28.1*  MCV 89.1 89.8  PLT 169 161   Cardiac Enzymes: No results for input(s): "CKTOTAL", "CKMB", "CKMBINDEX", "TROPONINI" in the last 168 hours. CBG: Recent Labs  Lab 01/31/22 1157 01/31/22 1702 01/31/22 2134 02/01/22 0627 02/01/22 1127  GLUCAP 157* 78 169* 113* 137*    Iron Studies:  No results for input(s): "IRON", "TIBC", "TRANSFERRIN", "FERRITIN" in the last 72 hours.  Studies/Results: No results found.   amLODipine  10 mg Oral q morning   aspirin EC  81 mg Oral  Daily   atorvastatin  80 mg Oral q morning   Chlorhexidine Gluconate Cloth  6 each Topical Q0600   clopidogrel  75 mg Oral q morning   gabapentin  100 mg Oral q morning   heparin injection (subcutaneous)  5,000 Units Subcutaneous Q8H   hydrALAZINE  50 mg Oral Q8H   insulin aspart  0-15 Units Subcutaneous TID WC   insulin aspart  10 Units Subcutaneous TID WC   insulin glargine-yfgn  20 Units Subcutaneous Daily   loratadine  10 mg Oral Daily   mometasone-formoterol  2 puff Inhalation BID   nebivolol  20 mg Oral Daily   neomycin-bacitracin-polymyxin   Topical TID   pantoprazole  40 mg Oral q morning   QUEtiapine  300 mg Oral QHS   sertraline  50 mg Oral QPM   Vitamin D (Ergocalciferol)  50,000 Units Oral Q Mon    BMET    Component Value Date/Time   NA 134 (L) 02/01/2022 0605   K 4.2 02/01/2022 0605   CL 100 02/01/2022 0605   CO2 23 02/01/2022 0605   GLUCOSE 106 (H) 02/01/2022 0605   BUN 70 (H) 02/01/2022 0605   CREATININE 3.96 (H) 02/01/2022 0605   CREATININE 1.36 (H) 11/21/2020 1451   CALCIUM 9.0 02/01/2022 0605   GFRNONAA 12 (L) 02/01/2022 0605   GFRNONAA 43 (L) 11/21/2020 1451   GFRAA 50 (L) 11/21/2020 1451   CBC    Component Value Date/Time   WBC 5.0 01/28/2022 0200   RBC 3.13 (L) 01/28/2022 0200   HGB 9.2 (L) 01/28/2022 0200   HCT 28.1 (L) 01/28/2022 0200   PLT 161 01/28/2022 0200   MCV 89.8 01/28/2022 0200   MCH 29.4 01/28/2022 0200   MCHC 32.7 01/28/2022 0200   RDW 13.6 01/28/2022 0200   LYMPHSABS 1.1 12/30/2021 2342   MONOABS 0.6 12/30/2021 2342   EOSABS 0.1 12/30/2021 2342   BASOSABS 0.0 12/30/2021 2342

## 2022-02-02 LAB — GLUCOSE, CAPILLARY
Glucose-Capillary: 84 mg/dL (ref 70–99)
Glucose-Capillary: 201 mg/dL — ABNORMAL HIGH (ref 70–99)
Glucose-Capillary: 157 mg/dL — ABNORMAL HIGH (ref 70–99)

## 2022-02-02 LAB — RENAL FUNCTION PANEL
Albumin: 2.4 g/dL — ABNORMAL LOW (ref 3.5–5.0)
Anion gap: 10 (ref 5–15)
BUN: 71 mg/dL — ABNORMAL HIGH (ref 6–20)
CO2: 23 mmol/L (ref 22–32)
Calcium: 8.9 mg/dL (ref 8.9–10.3)
Chloride: 102 mmol/L (ref 98–111)
Creatinine, Ser: 3.46 mg/dL — ABNORMAL HIGH (ref 0.44–1.00)
GFR, Estimated: 15 mL/min — ABNORMAL LOW (ref >60)
Glucose, Bld: 85 mg/dL (ref 70–99)
Phosphorus: 4.7 mg/dL — ABNORMAL HIGH (ref 2.5–4.6)
Potassium: 4.1 mmol/L (ref 3.5–5.1)
Sodium: 135 mmol/L (ref 135–145)

## 2022-02-02 NOTE — Progress Notes (Signed)
Progress Note  Patient: Carol [name] JME:[id number] DOB: [birth year]  DOA: 01/22/2022  DOS: 02/02/2022    Brief hospital course: Carol [name] is a 61 y.o. female with a history of IDT2DM, uncontrolled HTN, cocaine use, stage IIIb CKD, CAD s/p recent NSTEMI managed medically who presented 6/5 with chest pain and dyspnea. She was cocaine positive, appeared volume overloaded with dependent edema. Troponin was 50. CXR suggested interstitial edema. CTA chest showed no aortic dissection, CT head nonacute. Cardiology was consulted and the patient was admitted on nitroglycerin infusion with hypertensive urgency. Mild troponin elevation is felt to be due to demand ischemia, and with improvement in HTN, symptoms have resolved. IV lasix was started but stopped the day after admission. Unfortunately, she has developed oliguric AKI, with creatinine trending upward. Nephrology is consulted and supportive treatment ongoing - transiently required HD but renal function now improving somewhat - holding off on further HD in the interim.  Assessment and Plan:  Acute renal failure, unspecified - on CKD IIIb, multifactorial, improving:  - In the setting of hypertensive emergency, multiple nephrotoxic medications, cocaine use, recent contrast.   -No findings on ultrasound or imaging, FENa less than 1% -Nephrology consulted -Temporary right IJ HD catheter placed 01/26/2022 - transiently requiring hemodialysis - UOP continues to improve somewhat; HD on hold for now  Uncontrolled HTN with HTN urgency, resolved - Precipitated by cocaine use/abuse with medication noncompliance - Nitro drip previously required and now discontinued - Continue amlodipine 10 mg, hydralazine 50mg  po TID, Imdur - Continue on nebivolol  Acute on chronic HFpEF, resolved:  - CXR showing vascular congestion and pulmonary edema. BNP elevated to 618 in the setting of renal failure. -Continue volume management with dialysis - will  need to transition to diuretics if renal recovery continues - HTN medications as above  Left AC IV infiltration and wound  -Not present on admission -Topical Neosporin 3 times daily x1 week, follow clinically  Hyperkalemia:  -Stabilizing - follow closely now that HD is paused  Acute hypoxic respiratory failure:  -Resolving with fluid management likely secondary to heart failure and volume overload in the setting of renal failure  Thyroid nodule:  -Incidentally noted on CTA. TSH and free T4 wnl.  - Needs U/S as outpatient, consideration of further FNA based on characteristics.   Demand myocardial ischemia due to cocaine intoxication/HTN urgency in pt with known CAD.  -Not consistent with ACS, plan outpatient follow up and risk factor modification. hx NSTEMI and had LHC of 5/3 showing 100% stenosis of a small sub-branch of the right PDA. Medical management with DAPT, beta blocker, and statin was recommended.  - Continue aspirin and plavix, statin, -initially holding BB w/+cocaine status but not cleared.  Cocaine use:  +UDS.  - Cessation counseling provided.  IDT2DM, well controlled:  HbA1c 6.4%.  -Continue glargine 20u daily, and aspart 10u TID + mod SSI. Anticipate decreasing aspart as renal function worsens, but currently at inpatient goal and taking good po.     Anemia of CKD and iron deficiency: Hgb down but has stabilized. - Changed VTE ppx to heparin - Monitor CBC.  - IV iron completed  HLD:  - Continue statin  Prolonged QT interval: QTc of 514 on EKG.   - Avoid QT prolongation medication.  GERD:  - PPI  Anxiety:  -Improving with resolving respiratory/volume status as above  Obesity: Estimated body mass index is 35.87 kg/m as calculated from the following:   Height as of this encounter: 5\' 4"  (1.626  m).   Weight as of this encounter: 94.8 kg.  Subjective: Overnight left AC IV appears to have become somewhat irritated and ultimately dislodged.  Hard erythematous  nodule noted at left [facility] previous IV site, will place topical antibiotic in this area precautionary.  Objective: Vitals:   02/01/22 1626 02/01/22 2020 02/01/22 2354 02/02/22 0314  BP: 139/70 131/73 100/73 114/60  Pulse: 66 65 (!) 58 (!) 58  Resp: 17 (!) 21 17 18   Temp: 98.2 F (36.8 C) 98.3 F (36.8 C) 98.5 F (36.9 C) 98.7 F (37.1 C)  TempSrc: Oral Oral Oral Oral  SpO2: 100% 98% 97% 95%  Weight:      Height:       Gen: 61 y.o. female in no distress Pulm: Without overt wheezes rales or rhonchi CV: Regular rate and rhythm. No murmur, rub, or gallop. No JVD, trace dependent edema. GI: Abdomen soft, non-tender, non-distended, with normoactive bowel sounds.  Ext: Warm, no deformities Skin: No rashes, lesions or ulcers on visualized skin. HD cath site c/d/i Neuro: Alert and oriented. No focal neurological deficits. Psych: Anxious, insight intact.    Data Personally reviewed:  CBC: Recent Labs  Lab 01/27/22 0046 01/28/22 0200  WBC 5.9 5.0  HGB 8.8* 9.2*  HCT 26.9* 28.1*  MCV 89.1 89.8  PLT 169 161    Basic Metabolic Panel: Recent Labs  Lab 01/29/22 0554 01/30/22 0049 01/31/22 0415 02/01/22 0605 02/02/22 0550  NA 130* 129* 133* 134* 135  K 4.5 4.0 4.2 4.2 4.1  CL 97* 100 102 100 102  CO2 23 21* 24 23 23   GLUCOSE 123* 194* 124* 106* 85  BUN 56* 61* 66* 70* 71*  CREATININE 4.93* 4.51* 4.04* 3.96* 3.46*  CALCIUM 8.8* 8.5* 8.5* 9.0 8.9  PHOS 6.3* 6.3* 5.3* 4.7* 4.7*    TSH: 1.374 Free T4: 0.61 UPr:Cr 6.06, FENa 0.82% UA: >300 protein, 50 glucose, 11-20 WBCs, no RBCs.  Renal U/S 6/7: Normal echogenicity. 1. No acute findings. 2. Incidentally imaged cholelithiasis.    CTA chest 6/5:  1. No acute aortic findings. 2. Mild cardiomegaly and left ventricular hypertrophy. 3. Solid 2.7 cm left thyroid nodule. Recommend thyroid US (ref: J Am Coll Radiol. 2015 Feb;12(2): 143-50). 4. Aortic Atherosclerosis (ICD10-I70.0). 5. Possible hepatic steatosis. 6.  Cholelithiasis. 7. Mild subpleural reticular interstitial accentuation in the lungs, nonspecific. No substantial advancement compared to the appearance of the lung bases on 01/15/2020.  Family Communication: Friend at bedside  Disposition: Status is: Inpatient Remains inpatient appropriate because: Renal failure requiring dialysis Planned Discharge Destination: [place], [place] 02/02/2022 7:38 AM Page by [email or web address]

## 2022-02-02 NOTE — Progress Notes (Signed)
Patient ID: Carol [name], female   DOB: 21-Jun-1961, 61 y.o.   MRN: [id number]  Assessment/Plan:  AKI/CKD stage IIIb, oliguric - multiple renal insults including cocaine, hypertensive urgency with rapid over correction and hypotension with concomitant ARB, as well as CIN from IV contrast +/- NSAIDs.  UOP had been picking up but none since yesterday and BUN/Cr continue to climb.  She has no uremic signs or symptoms.  Continue with current BP regimen.  Continue to hold ARB, HCTZ, and metformin.  Gave a dose of IV lasix on 6/11.   S/p temp HD catheter 01/26/22 and completed first HD session late last night/early morning 6/10.   Slowly recovering kidney function and I pulled the HD catheter today; hemostasis easily obtained and placed a pressure bandage as well. TED stockings to help mobilize fluid as she still has signif lower ext swelling.  May also take time to get back to her BL; will need to f/u with [facility] in 2 weeks.Office notified and they will contact the pt. Will sign off at this time; please reconsult as needed. Hyperkalemia - Dose of IV lasix 160 mg x 1 on 6/11 and stopped Lokelma for now and following closely. HTN urgency - improved.  Continue with amlodipine and hydralazine.  Nebivolol back on + ARB/HCTZ on hold due to AKI. Acute on chronic diastolic CHF - improved.  Lasix on hold due to AKI; will give PRN. Anemia of CKD stage IIIb - follow H/H and transfuse prn.  Hold off on ESA for now.  TSAT 7% and started IV iron.  Continue to follow. CAD - with demand ischemia.  Improved. Cocaine abuse - we discussed the risks to her health with ongoing use.  She reports that she had been clean for 20 years until this April.  Is willing to stop and move out of her current situation.  DM type 2- per primary.  S: No events overnight.  Feels better but c/o right arm pain. Left arm IV pulled bec of infiltration. Good appetite.   O:BP (!) 99/58 (BP Location: Left Arm)   Pulse (!) 58   Temp 98.8 F  (37.1 C) (Oral)   Resp 18   Ht 5\' 4"  (1.626 m)   Wt 94.8 kg   SpO2 97%   BMI 35.87 kg/m   Intake/Output Summary (Last 24 hours) at 02/02/2022 1038 Last data filed at 02/02/2022 0315 Gross per 24 hour  Intake 480 ml  Output 700 ml  Net -220 ml   Intake/Output: I/O last 3 completed shifts: In: 720 [P.O.:720] Out: 900 [Urine:900]  Intake/Output this shift:  No intake/output data recorded. Weight change:  Gen:NAD CVS: RRR Resp: CTA Abd: +Bs, soft, NT/ND Ext: trace pretibial edema   Recent Labs  Lab 01/28/22 0200 01/28/22 1433 01/29/22 0554 01/30/22 0049 01/31/22 0415 02/01/22 0605 02/02/22 0550  NA 130* 129* 130* 129* 133* 134* 135  K 5.4* 4.6 4.5 4.0 4.2 4.2 4.1  CL 96* 96* 97* 100 102 100 102  CO2 23 19* 23 21* 24 23 23   GLUCOSE 152* 187* 123* 194* 124* 106* 85  BUN 48* 53* 56* 61* 66* 70* 71*  CREATININE 5.45* 5.36* 4.93* 4.51* 4.04* 3.96* 3.46*  ALBUMIN 2.5* 2.6* 2.3* 2.4* 2.4* 2.4* 2.4*  CALCIUM 8.8* 8.8* 8.8* 8.5* 8.5* 9.0 8.9  PHOS 5.1* 6.1* 6.3* 6.3* 5.3* 4.7* 4.7*   Liver Function Tests: Recent Labs  Lab 01/31/22 0415 02/01/22 0605 02/02/22 0550  ALBUMIN 2.4* 2.4* 2.4*   No results  for input(s): "LIPASE", "AMYLASE" in the last 168 hours. No results for input(s): "AMMONIA" in the last 168 hours. CBC: Recent Labs  Lab 01/27/22 0046 01/28/22 0200  WBC 5.9 5.0  HGB 8.8* 9.2*  HCT 26.9* 28.1*  MCV 89.1 89.8  PLT 169 161   Cardiac Enzymes: No results for input(s): "CKTOTAL", "CKMB", "CKMBINDEX", "TROPONINI" in the last 168 hours. CBG: Recent Labs  Lab 02/01/22 0627 02/01/22 1127 02/01/22 1627 02/01/22 2140 02/02/22 0620  GLUCAP 113* 137* 243* 105* 84    Iron Studies:  No results for input(s): "IRON", "TIBC", "TRANSFERRIN", "FERRITIN" in the last 72 hours.  Studies/Results: No results found.   amLODipine  10 mg Oral q morning   aspirin EC  81 mg Oral Daily   atorvastatin  80 mg Oral q morning   Chlorhexidine Gluconate Cloth  6  each Topical Q0600   clopidogrel  75 mg Oral q morning   gabapentin  100 mg Oral q morning   heparin injection (subcutaneous)  5,000 Units Subcutaneous Q8H   hydrALAZINE  50 mg Oral Q8H   insulin aspart  0-15 Units Subcutaneous TID WC   insulin aspart  10 Units Subcutaneous TID WC   insulin glargine-yfgn  20 Units Subcutaneous Daily   loratadine  10 mg Oral Daily   mometasone-formoterol  2 puff Inhalation BID   nebivolol  20 mg Oral Daily   neomycin-bacitracin-polymyxin   Topical TID   pantoprazole  40 mg Oral q morning   QUEtiapine  300 mg Oral QHS   sertraline  50 mg Oral QPM   Vitamin D (Ergocalciferol)  50,000 Units Oral Q Mon    BMET    Component Value Date/Time   NA 135 02/02/2022 0550   K 4.1 02/02/2022 0550   CL 102 02/02/2022 0550   CO2 23 02/02/2022 0550   GLUCOSE 85 02/02/2022 0550   BUN 71 (H) 02/02/2022 0550   CREATININE 3.46 (H) 02/02/2022 0550   CREATININE 1.36 (H) 11/21/2020 1451   CALCIUM 8.9 02/02/2022 0550   GFRNONAA 15 (L) 02/02/2022 0550   GFRNONAA 43 (L) 11/21/2020 1451   GFRAA 50 (L) 11/21/2020 1451   CBC    Component Value Date/Time   WBC 5.0 01/28/2022 0200   RBC 3.13 (L) 01/28/2022 0200   HGB 9.2 (L) 01/28/2022 0200   HCT 28.1 (L) 01/28/2022 0200   PLT 161 01/28/2022 0200   MCV 89.8 01/28/2022 0200   MCH 29.4 01/28/2022 0200   MCHC 32.7 01/28/2022 0200   RDW 13.6 01/28/2022 0200   LYMPHSABS 1.1 12/30/2021 2342   MONOABS 0.6 12/30/2021 2342   EOSABS 0.1 12/30/2021 2342   BASOSABS 0.0 12/30/2021 2342

## 2022-02-03 LAB — RENAL FUNCTION PANEL
Albumin: 2.3 g/dL — ABNORMAL LOW (ref 3.5–5.0)
Anion gap: 10 (ref 5–15)
BUN: 73 mg/dL — ABNORMAL HIGH (ref 6–20)
CO2: 20 mmol/L — ABNORMAL LOW (ref 22–32)
Calcium: 8.5 mg/dL — ABNORMAL LOW (ref 8.9–10.3)
Chloride: 100 mmol/L (ref 98–111)
Creatinine, Ser: 3.56 mg/dL — ABNORMAL HIGH (ref 0.44–1.00)
GFR, Estimated: 14 mL/min — ABNORMAL LOW (ref >60)
Glucose, Bld: 128 mg/dL — ABNORMAL HIGH (ref 70–99)
Phosphorus: 4.5 mg/dL (ref 2.5–4.6)
Potassium: 4.2 mmol/L (ref 3.5–5.1)
Sodium: 130 mmol/L — ABNORMAL LOW (ref 135–145)

## 2022-02-03 LAB — GLUCOSE, CAPILLARY
Glucose-Capillary: 197 mg/dL — ABNORMAL HIGH (ref 70–99)
Glucose-Capillary: 86 mg/dL (ref 70–99)
Glucose-Capillary: 162 mg/dL — ABNORMAL HIGH (ref 70–99)
Glucose-Capillary: 185 mg/dL — ABNORMAL HIGH (ref 70–99)

## 2022-02-03 MED ORDER — BENZOCAINE 20 % MT AERO
INHALATION_SPRAY | Freq: Four times a day (QID) | OROMUCOSAL | Status: DC | PRN
  Filled 2022-02-03: qty 57

## 2022-02-03 NOTE — Progress Notes (Signed)
Occupational Therapy Treatment Patient Details Name: Carol [name] MRN: [id number] DOB: [birth year] Today's Date: 02/03/2022   History of present illness Patient is a 61 y/o female who presents on 01/22/22 with chest pain, SOB (in setting of cocaine withdrawal) and heart palpitations. Workup for HTN emergency and acute on chronic CHF. CXR-pulmonary edema. S/p RIJ temp HD cath placement 6/9. PMH includes anxiety, chronic back pain, depression, DM, HTN, PD, NSTEMI, cocaine use; of note, recent admission 5/13-5/17/23 for hypoglycemia, elevated BP and 5/3-12/24/21 for NSTEMI.   OT comments  Pt. Seen for skilled OT treatment session.  Able to demonstrate short distance ambulation to recliner in room and also simulated tub transfer for R side faucet entry.  Pt. Reports at end of session long standing swallowing issues with liquids, food, and breathing in general.  See below for further details but alerted RN of pt.s concerns.  Could benefit from [place] consult.     Recommendations for follow up therapy are one component of a multi-disciplinary discharge planning process, led by the attending physician.  Recommendations may be updated based on patient status, additional functional criteria and insurance authorization.    Follow Up Recommendations  Home health OT    Assistance Recommended at Discharge Set up Supervision/Assistance  Patient can return home with the following  A little help with bathing/dressing/bathroom;Assistance with cooking/housework;Help with stairs or ramp for entrance;Assist for transportation   Equipment Recommendations  BSC/3in1    Recommendations for Other Services      Precautions / Restrictions Precautions Precautions: Fall Precaution Comments: watch BP       Mobility Bed Mobility Overal bed mobility: Independent                  Transfers Overall transfer level: Needs assistance Equipment used: None Transfers: Sit to/from Stand, Bed to  chair/wheelchair/BSC Sit to Stand: Min guard           General transfer comment: min guard around bed to recliner and back to bed     Balance                                           ADL either performed or assessed with clinical judgement   ADL Overall ADL's : Needs assistance/impaired Eating/Feeding: Cueing for sequencing;Cueing for compensatory techinques;Sitting;Set up Eating/Feeding Details (indicate cue type and reason): pt. with reported difficulty swallowing liquids and foods.  noted what appears to not be a delay but also spits some of it out at end because she doesnt feel she can get it down. states she is in a panic and feels like she is drowning. also describes not even being able to fully swallow when food or beverage not involved.  If she attempts to swallow saliva states she can not.  Reports never being able to swallow pills and would chew them or crush them.  Does not feel comfortable eating/drinking in front of people because of how she can not fully complete it without spitting out.  Appears to not have a natural swallow with rise/fall almost like she is taking a quick gulp just to get it down could not prolong swallow multiple swallows only one at a time and not fully successful even with one swallow.                       Toilet Transfer: Magazine features editor  Details (indicate cue type and reason): simulated in room  Tub transfer: able to complete simulated tub transfer with min a in/out simulated height of tub.  Rec. Having someone assist her with in/out she agreed.           Functional mobility during ADLs: Min guard General ADL Comments: pt. with intermittent furniture walking.  spoke a lot about her swallowing concerns.    Extremity/Trunk Assessment              Vision       Perception     Praxis      Cognition Arousal/Alertness: Awake/alert Behavior During Therapy: WFL for tasks assessed/performed Overall  Cognitive Status: Within Functional Limits for tasks assessed                                          Exercises      Shoulder Instructions       General Comments      Pertinent Vitals/ Pain       Pain Assessment Pain Assessment: No/denies pain  Home Living                                          Prior Functioning/Environment              Frequency  Min 2X/week        Progress Toward Goals  OT Goals(current goals can now be found in the care plan section)  Progress towards OT goals: Progressing toward goals     Plan Discharge plan remains appropriate    Co-evaluation                 AM-PAC OT "6 Clicks" Daily Activity     Outcome Measure   Help from another person eating meals?: None Help from another person taking care of personal grooming?: None Help from another person toileting, which includes using toliet, bedpan, or urinal?: None Help from another person bathing (including washing, rinsing, drying)?: A Little Help from another person to put on and taking off regular upper body clothing?: None Help from another person to put on and taking off regular lower body clothing?: A Little 6 Click Score: 22    End of Session    OT Visit Diagnosis: Unsteadiness on feet (R26.81);Other abnormalities of gait and mobility (R26.89);Muscle weakness (generalized) (M62.81)   Activity Tolerance Patient tolerated treatment well   Patient Left in bed;with call bell/phone within reach   Nurse Communication Other (comment) (alerted rn pt. stated swallowing issues, could benefit from ST consult)        Time: 9381-8299 OT Time Calculation (min): 17 min  Charges: OT General Charges $OT Visit: 1 Visit OT Treatments $Self Care/Home Management : 8-22 mins  Sonia Baller, COTA/L Acute Rehabilitation [phone]   Tanya Nones 02/03/2022, 10:43 AM

## 2022-02-03 NOTE — Progress Notes (Signed)
Progress Note  Patient: Carol [name] YKD:[id number] DOB: 1960/09/22  DOA: 01/22/2022  DOS: 02/03/2022    Brief hospital course: Carol [name] is a 61 y.o. female with a history of IDT2DM, uncontrolled HTN, cocaine use, stage IIIb CKD, CAD s/p recent NSTEMI managed medically who presented 6/5 with chest pain and dyspnea. She was cocaine positive, appeared volume overloaded with dependent edema. Troponin was 50. CXR suggested interstitial edema. CTA chest showed no aortic dissection, CT head nonacute. Cardiology was consulted and the patient was admitted on nitroglycerin infusion with hypertensive urgency. Mild troponin elevation is felt to be due to demand ischemia, and with improvement in HTN, symptoms have resolved. IV lasix was started but stopped the day after admission. Unfortunately, she has developed oliguric AKI, with creatinine trending upward. Nephrology is consulted and supportive treatment ongoing - transiently required HD but renal function now improving somewhat - holding off on further HD in the interim.  Assessment and Plan:  Acute renal failure, unspecified - on CKD IIIb, multifactorial, improving:  - In the setting of hypertensive emergency, multiple nephrotoxic medications, cocaine use, recent contrast.   -No findings on ultrasound or imaging, FENa less than 1% -Nephrology signing off given drastic improvement -Temporary right IJ HD catheter placed 01/26/2022 - transiently requiring hemodialysis - now discontinued - HD cath pulled 6/16 -Creatinine appears to be stabilizing around 3.5, concern this is patient's new baseline, will follow over the next 24 hours to ensure urine output and creatinine continue to hold stable or improve before discharge and outpatient nephrology follow-up.  Uncontrolled HTN with HTN urgency, resolved - Precipitated by cocaine use/abuse with medication noncompliance - Nitro drip previously required and now discontinued - Continue  amlodipine 10 mg, hydralazine 50mg  po TID, Imdur - Continue on nebivolol  Acute on chronic HFpEF, resolved:  - CXR showing vascular congestion and pulmonary edema. BNP elevated to 618 in the setting of renal failure. -Continue volume management with dialysis - will need to transition to diuretics if renal recovery continues - HTN medications as above  Left AC IV infiltration and wound  -Not present on admission -Topical Neosporin 3 times daily x1 week, follow clinically  Hyperkalemia:  -Stabilizing - follow closely now that HD is paused  Acute hypoxic respiratory failure:  -Resolving with fluid management likely secondary to heart failure and volume overload in the setting of renal failure  Thyroid nodule:  -Incidentally noted on CTA. TSH and free T4 wnl.  - Needs U/S as outpatient, consideration of further FNA based on characteristics.   Demand myocardial ischemia due to cocaine intoxication/HTN urgency in pt with known CAD.  -Not consistent with ACS, plan outpatient follow up and risk factor modification. hx NSTEMI and had LHC of 5/3 showing 100% stenosis of a small sub-branch of the right PDA. Medical management with DAPT, beta blocker, and statin was recommended.  - Continue aspirin and plavix, statin, -initially holding BB w/+cocaine status but not cleared.  Cocaine use:  +UDS.  - Cessation counseling provided.  IDT2DM, well controlled:  HbA1c 6.4%.  -Continue glargine 20u daily, and aspart 10u TID + mod SSI. Anticipate decreasing aspart as renal function worsens, but currently at inpatient goal and taking good po.     Anemia of CKD and iron deficiency: Hgb down but has stabilized. - Changed VTE ppx to heparin - Monitor CBC.  - IV iron completed  HLD:  - Continue statin  Prolonged QT interval: QTc of 514 on EKG.   - Avoid QT prolongation medication.  GERD:  - PPI  Anxiety:  -Improving with resolving respiratory/volume status as above  Obesity: Estimated body  mass index is 36.86 kg/m as calculated from the following:   Height as of this encounter: 5\' 4"  (1.626 m).   Weight as of this encounter: 97.4 kg.  Subjective: No acute issues or events overnight.  Objective: Vitals:   02/02/22 2122 02/02/22 2330 02/03/22 0436 02/03/22 0629  BP: 116/61 117/74 109/73   Pulse: 60 (!) 57 (!) 54   Resp: 16 17 14    Temp: 97.9 F (36.6 C) 98.4 F (36.9 C) 98.1 F (36.7 C)   TempSrc: Oral Oral Oral   SpO2: 97% 96% 96%   Weight:    97.4 kg  Height:       Gen: 61 y.o. female in no distress Pulm: Without overt wheezes rales or rhonchi CV: Regular rate and rhythm. No murmur, rub, or gallop. No JVD, trace dependent edema. GI: Abdomen soft, non-tender, non-distended, with normoactive bowel sounds.  Ext: Warm, no deformities Skin: No rashes, lesions or ulcers on visualized skin. HD cath site c/d/i Neuro: Alert and oriented. No focal neurological deficits. Psych: Anxious, insight intact.    Data Personally reviewed:  CBC: Recent Labs  Lab 01/28/22 0200  WBC 5.0  HGB 9.2*  HCT 28.1*  MCV 89.8  PLT 263    Basic Metabolic Panel: Recent Labs  Lab 01/30/22 0049 01/31/22 0415 02/01/22 0605 02/02/22 0550 02/03/22 0045  NA 129* 133* 134* 135 130*  K 4.0 4.2 4.2 4.1 4.2  CL 100 102 100 102 100  CO2 21* 24 23 23  20*  GLUCOSE 194* 124* 106* 85 128*  BUN 61* 66* 70* 71* 73*  CREATININE 4.51* 4.04* 3.96* 3.46* 3.56*  CALCIUM 8.5* 8.5* 9.0 8.9 8.5*  PHOS 6.3* 5.3* 4.7* 4.7* 4.5    TSH: 1.374 Free T4: 0.61 UPr:Cr 6.06, FENa 0.82% UA: >300 protein, 50 glucose, 11-20 WBCs, no RBCs.  Renal U/S 6/7: Normal echogenicity. 1. No acute findings. 2. Incidentally imaged cholelithiasis.    CTA chest 6/5:  1. No acute aortic findings. 2. Mild cardiomegaly and left ventricular hypertrophy. 3. Solid 2.7 cm left thyroid nodule. Recommend thyroid US (ref: J Am Coll Radiol. 2015 Feb;12(2): 143-50). 4. Aortic Atherosclerosis (ICD10-I70.0). 5. Possible  hepatic steatosis. 6. Cholelithiasis. 7. Mild subpleural reticular interstitial accentuation in the lungs, nonspecific. No substantial advancement compared to the appearance of the lung bases on 01/15/2020.  Family Communication: Friend at bedside  Disposition: Status is: Inpatient Remains inpatient appropriate because: Renal failure requiring dialysis Planned Discharge Destination: [place], [place] 02/03/2022 7:45 AM Page by [email or web address]

## 2022-02-04 LAB — RENAL FUNCTION PANEL
Albumin: 2.4 g/dL — ABNORMAL LOW (ref 3.5–5.0)
Anion gap: 7 (ref 5–15)
BUN: 73 mg/dL — ABNORMAL HIGH (ref 6–20)
CO2: 20 mmol/L — ABNORMAL LOW (ref 22–32)
Calcium: 8.4 mg/dL — ABNORMAL LOW (ref 8.9–10.3)
Chloride: 104 mmol/L (ref 98–111)
Creatinine, Ser: 3.54 mg/dL — ABNORMAL HIGH (ref 0.44–1.00)
GFR, Estimated: 14 mL/min — ABNORMAL LOW (ref >60)
Glucose, Bld: 125 mg/dL — ABNORMAL HIGH (ref 70–99)
Phosphorus: 3.9 mg/dL (ref 2.5–4.6)
Potassium: 4.2 mmol/L (ref 3.5–5.1)
Sodium: 131 mmol/L — ABNORMAL LOW (ref 135–145)

## 2022-02-04 LAB — GLUCOSE, CAPILLARY
Glucose-Capillary: 95 mg/dL (ref 70–99)
Glucose-Capillary: 158 mg/dL — ABNORMAL HIGH (ref 70–99)

## 2022-02-04 MED ORDER — INSULIN GLARGINE 100 UNIT/ML SOLOSTAR PEN: 20.0000 [IU] | PEN_INJECTOR | Freq: Every morning | SUBCUTANEOUS | 1 refills | Status: DC

## 2022-02-04 MED ORDER — FUROSEMIDE 40 MG PO TABS: 20.0000 mg | ORAL_TABLET | Freq: Every day | ORAL | 0 refills | Status: DC

## 2022-02-04 MED ORDER — INSULIN LISPRO (1 UNIT DIAL) 100 UNIT/ML (KWIKPEN): 10.0000 [IU] | PEN_INJECTOR | Freq: Three times a day (TID) | SUBCUTANEOUS | 1 refills | Status: DC

## 2022-02-04 NOTE — Discharge Summary (Signed)
Physician Discharge Summary  Carol [name] WJX:[id number] DOB: 06-22-1961 DOA: 01/22/2022  PCP: Simona Huh, NP  Admit date: 01/22/2022 Discharge date: 02/04/2022  Admitted From: Home Disposition: Home  Recommendations for Outpatient Follow-up:  Follow up with PCP in 1-2 weeks Follow-up with nephrology and cardiology as scheduled  Home Health: PT OT Equipment/Devices: No new equipment  Discharge Condition: Stable CODE STATUS: Full Diet recommendation: Low-carb low-fat renal diet  Brief/Interim Summary: Carol [name] is a 61 y.o. female with a history of IDT2DM, uncontrolled HTN, cocaine use, stage IIIb CKD, CAD s/p recent NSTEMI managed medically who presented 6/5 with chest pain and dyspnea. She was cocaine positive, appeared volume overloaded with dependent edema. Troponin was 50. CXR suggested interstitial edema. CTA chest showed no aortic dissection, CT head nonacute. Cardiology was consulted and the patient was admitted on nitroglycerin infusion with hypertensive urgency/emergency. Mild troponin elevation is felt to be due to demand ischemia, and with improvement in HTN, symptoms have resolved. IV lasix was started but stopped the day after admission with worsening AKI ultimately requiring transient dialysis.  Fortunately patient's creatinine continued to downtrend and her urine output continued to improve.  Unfortunately her kidneys have stabilized at new diminished level at CKD 4 with GFR around 15-16, previously CKD 3B.  We discussed this means she needs closer monitoring with nephrology, better management of her fluid intake as well as more focus on a renal diet.  We will resume patient's home medications at discharge including low-dose Lasix given ongoing fluid management needs.  New medications outlined as below and explained at discharge.  Discharge Diagnoses:  Principal Problem:   Hypertensive urgency Active Problems:   Acute on chronic diastolic CHF  (congestive heart failure) (HCC)   Diabetes mellitus ([place])   Pure hypercholesterolemia   CAD in native artery   Cocaine abuse ([place])   Acute kidney injury superimposed on chronic kidney disease (HCC)   Prolonged QT interval   Elevated troponin   Thyroid nodule  Acute renal failure, unspecified - on CKD IIIb, multifactorial, improving:  - In the setting of hypertensive emergency, multiple nephrotoxic medications, cocaine use, recent contrast.   - No findings on ultrasound or imaging, FENa less than 1% - Nephrology signing off given drastic improvement - Temporary right IJ HD catheter placed 01/26/2022 - transiently requiring hemodialysis - now discontinued - HD cath pulled 6/16 - Creatinine appears to be stabilizing around 3.5, concern this is patient's new baseline, follow closely with outpatient nephrology, we discussed that she may continue to progress slowly and require dialysis again if she does not take care to manage her volume and continue appropriate diet and glucose control.   Uncontrolled HTN with HTN urgency, resolved - Precipitated by cocaine use/abuse with medication noncompliance -Continue medications as below   Acute on chronic HFpEF, resolved:  - CXR showing vascular congestion and pulmonary edema. BNP elevated to 618 in the setting of renal failure. -Resume home diuresis with low-dose Lasix   Left AC IV infiltration and wound  -Not present on admission -Topical Neosporin 3 times daily x1 week, follow clinically   Hyperkalemia:  -Stabilizing - follow closely now that HD is paused   Acute hypoxic respiratory failure:  -Resolving with fluid management likely secondary to heart failure and volume overload in the setting of renal failure   Thyroid nodule:  -Incidentally noted on CTA. TSH and free T4 wnl.  - Needs U/S as outpatient, consideration of further FNA based on characteristics.    Demand myocardial ischemia  due to cocaine intoxication/HTN urgency in pt with  known CAD.  -Not consistent with ACS, plan outpatient follow up and risk factor modification. hx NSTEMI and had LHC of 5/3 showing 100% stenosis of a small sub-branch of the right PDA. Medical management with DAPT, beta blocker, and statin was recommended.  - Continue aspirin and plavix, statin, -initially holding BB w/+cocaine status but not cleared.   Cocaine use:  +UDS.  - Cessation counseling provided.   IDT2DM, well controlled:  HbA1c 6.4%.  -Continue glargine 20u daily, and aspart 10u TID + mod SSI. Anticipate decreasing aspart as renal function worsens, but currently at inpatient goal and taking good po.      Anemia of CKD and iron deficiency: Hgb down but has stabilized. - Changed VTE ppx to heparin - Monitor CBC.  - IV iron completed   HLD:  - Continue statin   Prolonged QT interval: QTc of 514 on EKG.   - Avoid QT prolongation medication.   GERD:  - PPI   Anxiety:  -Improving with resolving respiratory/volume status as above   Obesity: Estimated body mass index is 36.86 kg/m as calculated from the following:   Height as of this encounter: '5\' 4"'  (1.626 m).   Weight as of this encounter: 97.4 kg.   Discharge Instructions  Discharge Instructions     Discharge patient   Complete by: As directed    Discharge disposition: 06-Home-Health Care Svc   Discharge patient date: 02/04/2022   Face-to-face encounter (required for Medicare/Medicaid patients)   Complete by: As directed    [place] certify that this patient is under my care and that I, or a nurse practitioner or physician's assistant working with me, had a face-to-face encounter that meets the physician face-to-face encounter requirements with this patient on 02/04/2022. The encounter with the patient was in whole, or in part for the following medical condition(s) which is the primary reason for home health care (List medical condition): ambulatory dysfunction   The encounter with the patient was in  whole, or in part, for the following medical condition, which is the primary reason for home health care: ambulatory dysfunction   I certify that, based on my findings, the following services are medically necessary home health services: Physical therapy   Reason for Medically Necessary Home Health Services: Therapy- Personnel officer, Public librarian   My clinical findings support the need for the above services: Unable to leave home safely without assistance and/or assistive device   Further, I certify that my clinical findings support that this patient is homebound due to: Unable to leave home safely without assistance   Home Health   Complete by: As directed    To provide the following care/treatments:  PT OT        Allergies as of 02/04/2022       Reactions   Penicillins Shortness Of Breath, Other (See Comments)   Caused yeast infection Has patient had a PCN reaction causing immediate rash, facial/tongue/throat swelling, SOB or lightheadedness with hypotension: Yes Has patient had a PCN reaction causing severe rash involving mucus membranes or skin necrosis: No Has patient had a PCN reaction that required hospitalization pt was in the hospital at the time of the reaction Has patient had a PCN reaction occurring within the last 10 years: No If all of the above answers are "NO", then may proceed with Cephalosp   Tramadol Other (See Comments)   Made her tongue raw  Medication List     STOP taking these medications    baclofen 10 MG tablet Commonly known as: LIORESAL   isosorbide mononitrate 30 MG 24 hr tablet Commonly known as: IMDUR   nitroGLYCERIN 0.4 MG SL tablet Commonly known as: NITROSTAT   olmesartan-hydrochlorothiazide 40-25 MG tablet Commonly known as: BENICAR HCT       TAKE these medications    albuterol 108 (90 Base) MCG/ACT inhaler Commonly known as: VENTOLIN HFA Inhale 2 puffs into the lungs every 4 (four) hours as needed for  wheezing or shortness of breath.   amLODipine 10 MG tablet Commonly known as: NORVASC Take 1 tablet (10 mg total) by mouth daily. What changed: when to take this   aspirin EC 81 MG tablet Take 1 tablet (81 mg total) by mouth daily. Swallow whole. What changed: when to take this   atorvastatin 80 MG tablet Commonly known as: LIPITOR Take 1 tablet (80 mg total) by mouth daily. What changed: when to take this   budesonide-formoterol 160-4.5 MCG/ACT inhaler Commonly known as: SYMBICORT Inhale 2 puffs into the lungs every morning.   cetirizine 10 MG tablet Commonly known as: ZYRTEC Take 10 mg by mouth every morning.   clopidogrel 75 MG tablet Commonly known as: PLAVIX Take 1 tablet (75 mg total) by mouth daily. What changed: when to take this   cyclobenzaprine 10 MG tablet Commonly known as: FLEXERIL Take 1 tablet (10 mg total) by mouth 3 (three) times daily as needed for muscle spasms.   ergocalciferol 1.25 MG (50000 UT) capsule Commonly known as: VITAMIN D2 Take 50,000 Units by mouth every Monday.   FREESTYLE TEST STRIPS test strip Generic drug: glucose blood 1 each by Other route as needed for other.   furosemide 40 MG tablet Commonly known as: LASIX Take 0.5 tablets (20 mg total) by mouth daily. What changed:  how much to take when to take this   gabapentin 100 MG capsule Commonly known as: NEURONTIN Take 100 mg by mouth every morning.   glimepiride 4 MG tablet Commonly known as: AMARYL Take 4 mg by mouth 2 (two) times daily.   hydrALAZINE 50 MG tablet Commonly known as: APRESOLINE Take 1 tablet (50 mg total) by mouth every 8 (eight) hours.   hydrOXYzine 50 MG tablet Commonly known as: ATARAX Take 50 mg by mouth daily as needed for anxiety or itching.   insulin glargine 100 UNIT/ML Solostar Pen Commonly known as: LANTUS Inject 20 Units into the skin every morning. What changed: how much to take   insulin lispro 100 UNIT/ML KwikPen Commonly known  as: HUMALOG Inject 10 Units into the skin 3 (three) times daily after meals. What changed: how much to take   metFORMIN 500 MG tablet Commonly known as: GLUCOPHAGE Take 500 mg by mouth 2 (two) times daily.   Nebivolol HCl 20 MG Tabs Take 20 mg by mouth every morning.   oxyCODONE-acetaminophen 5-325 MG tablet Commonly known as: PERCOCET/ROXICET Take 1-2 tablets by mouth every 6 (six) hours as needed for severe pain.   pantoprazole 40 MG tablet Commonly known as: PROTONIX Take 1 tablet (40 mg total) by mouth daily at 12 noon. What changed: when to take this   QUEtiapine 300 MG 24 hr tablet Commonly known as: SEROQUEL XR Take 300 mg by mouth at bedtime.   sertraline 50 MG tablet Commonly known as: ZOLOFT Take 50 mg by mouth every evening.   triamcinolone ointment 0.1 % Commonly known as: KENALOG Apply 1 application.  topically 2 (two) times daily.        Allergies  Allergen Reactions   Penicillins Shortness Of Breath and Other (See Comments)    Caused yeast infection Has patient had a PCN reaction causing immediate rash, facial/tongue/throat swelling, SOB or lightheadedness with hypotension: Yes Has patient had a PCN reaction causing severe rash involving mucus membranes or skin necrosis: No Has patient had a PCN reaction that required hospitalization pt was in the hospital at the time of the reaction Has patient had a PCN reaction occurring within the last 10 years: No If all of the above answers are "NO", then may proceed with Cephalosp   Tramadol Other (See Comments)    Made her tongue raw    Consultations: Nephrology   Procedures/Studies: DG CHEST PORT 1 VIEW  Result Date: 01/27/2022 CLINICAL DATA:  Chest pain and hypertension. EXAM: PORTABLE CHEST 1 VIEW COMPARISON:  01/22/2022 FINDINGS: a The cardio pericardial silhouette is enlarged. The lungs are clear without focal pneumonia, edema, pneumothorax or pleural effusion. Patchy airspace disease at the right  base suggests pneumonia. Right IJ central line tip overlies the SVC/RA junction. Telemetry leads overlie the chest. IMPRESSION: Patchy airspace disease at the right base suggests pneumonia. Electronically Signed   By: Misty Stanley M.D.   On: 01/27/2022 10:05   IR Fluoro Guide CV Line Right  Result Date: 01/26/2022 INDICATION: Acute renal insufficiency. Please perform image guided placement of a temporary hemodialysis catheter for the initiation of hemodialysis. EXAM: NON-TUNNELED CENTRAL VENOUS HEMODIALYSIS CATHETER PLACEMENT WITH ULTRASOUND AND FLUOROSCOPIC GUIDANCE COMPARISON:  None Available. MEDICATIONS: None FLUOROSCOPY TIME:  30 seconds COMPLICATIONS: None immediate. PROCEDURE: Informed written consent was obtained from the patient after a discussion of the risks, benefits, and alternatives to treatment. Questions regarding the procedure were encouraged and answered. The right neck and chest were prepped with chlorhexidine in a sterile fashion, and a sterile drape was applied covering the operative field. Maximum barrier sterile technique with sterile gowns and gloves were used for the procedure. A timeout was performed prior to the initiation of the procedure. After the overlying soft tissues were anesthetized, a small venotomy incision was created and a micropuncture kit was utilized to access the internal jugular vein. Real-time ultrasound guidance was utilized for vascular access including the acquisition of a permanent ultrasound image documenting patency of the accessed vessel. The microwire was utilized to measure appropriate catheter length. A stiff glidewire was advanced to the level of the IVC. Under fluoroscopic guidance, the venotomy was serially dilated, ultimately allowing placement of a 16 cm temporary Trialysis catheter with tip ultimately terminating within the superior aspect of the right atrium. Final catheter positioning was confirmed and documented with a spot radiographic image. The  catheter aspirates and flushes normally. The catheter was flushed with appropriate volume heparin dwells. The catheter exit site was secured with a 0-Prolene retention suture. A dressing was placed. The patient tolerated the procedure well without immediate post procedural complication. IMPRESSION: Successful placement of a right internal jugular approach 16 cm temporary dialysis catheter with tip terminating with in the superior aspect of the right atrium. The catheter is ready for immediate use. PLAN: This catheter may be converted to a tunneled dialysis catheter at a later date as indicated. Electronically Signed   By: Sandi Mariscal M.D.   On: 01/26/2022 14:48   IR US Guide Vasc Access Right  Result Date: 01/26/2022 INDICATION: Acute renal insufficiency. Please perform image guided placement of a temporary hemodialysis catheter for the  initiation of hemodialysis. EXAM: NON-TUNNELED CENTRAL VENOUS HEMODIALYSIS CATHETER PLACEMENT WITH ULTRASOUND AND FLUOROSCOPIC GUIDANCE COMPARISON:  None Available. MEDICATIONS: None FLUOROSCOPY TIME:  30 seconds COMPLICATIONS: None immediate. PROCEDURE: Informed written consent was obtained from the patient after a discussion of the risks, benefits, and alternatives to treatment. Questions regarding the procedure were encouraged and answered. The right neck and chest were prepped with chlorhexidine in a sterile fashion, and a sterile drape was applied covering the operative field. Maximum barrier sterile technique with sterile gowns and gloves were used for the procedure. A timeout was performed prior to the initiation of the procedure. After the overlying soft tissues were anesthetized, a small venotomy incision was created and a micropuncture kit was utilized to access the internal jugular vein. Real-time ultrasound guidance was utilized for vascular access including the acquisition of a permanent ultrasound image documenting patency of the accessed vessel. The microwire was  utilized to measure appropriate catheter length. A stiff glidewire was advanced to the level of the IVC. Under fluoroscopic guidance, the venotomy was serially dilated, ultimately allowing placement of a 16 cm temporary Trialysis catheter with tip ultimately terminating within the superior aspect of the right atrium. Final catheter positioning was confirmed and documented with a spot radiographic image. The catheter aspirates and flushes normally. The catheter was flushed with appropriate volume heparin dwells. The catheter exit site was secured with a 0-Prolene retention suture. A dressing was placed. The patient tolerated the procedure well without immediate post procedural complication. IMPRESSION: Successful placement of a right internal jugular approach 16 cm temporary dialysis catheter with tip terminating with in the superior aspect of the right atrium. The catheter is ready for immediate use. PLAN: This catheter may be converted to a tunneled dialysis catheter at a later date as indicated. Electronically Signed   By: Sandi Mariscal M.D.   On: 01/26/2022 14:48   US RENAL  Result Date: 01/24/2022 CLINICAL DATA:  Acute kidney injury. EXAM: RENAL / URINARY TRACT ULTRASOUND COMPLETE COMPARISON:  04/01/2019. FINDINGS: Right Kidney: Renal measurements: 12.5 x 4.4 x 4.8 cm = volume: 139 mL. Echogenicity within normal limits. No mass or hydronephrosis visualized. Left Kidney: Renal measurements: 11.3 x 5.9 x 4.5 cm = volume: 157 mL. Echogenicity within normal limits. No mass or hydronephrosis visualized. Bladder: Appears normal for degree of bladder distention. Other: Incidental shadowing echogenic stone in the gallbladder measures 1.3 cm. IMPRESSION: 1. No acute findings. 2. Incidentally imaged cholelithiasis. Electronically Signed   By: Lorin Picket M.D.   On: 01/24/2022 11:04   CT Angio Chest Aorta W and/or Wo Contrast  Result Date: 01/22/2022 CLINICAL DATA:  Chest pain and shortness of breath. Possible  acute aortic syndrome. EXAM: CT ANGIOGRAPHY CHEST WITH CONTRAST TECHNIQUE: Multidetector CT imaging of the chest was performed using the standard protocol during bolus administration of intravenous contrast. Multiplanar CT image reconstructions and MIPs were obtained to evaluate the vascular anatomy. RADIATION DOSE REDUCTION: This exam was performed according to the departmental dose-optimization program which includes automated exposure control, adjustment of the mA and/or kV according to patient size and/or use of iterative reconstruction technique. CONTRAST:  32m OMNIPAQUE IOHEXOL 350 MG/ML SOLN COMPARISON:  Chest radiograph 01/22/2022 FINDINGS: Cardiovascular: Contrast medium phase was timed for systemic arterial opacification. Contrast in the pulmonary arterial tree is somewhat dilute, and today's exam has a low sensitivity in assessing for pulmonary embolus. Atherosclerotic calcification of the aortic arch and descending thoracic aorta noted. Thickening of the interventricular septum at 2.0 cm suggesting left ventricular  hypertrophy. No aortic dissection or acute thoracic aortic findings. Mild cardiomegaly. Mediastinum/Nodes: Solid 2.7 cm left thyroid nodule. My review of the imaging record does not indicate that this has been previously assessed sub-centimeter nodules in the isthmus and right thyroid lobe. Scattered mostly small mediastinal lymph nodes. Right paratracheal node anterior to the carina is borderline prominent at 1.0 cm in diameter on image 32 series 5. Lungs/Pleura: Mild reticular subpleural interstitial accentuation in the lungs. Upper Abdomen: Possible hepatic steatosis. 1.2 cm gallstone in the gallbladder on image 96 series 5. Musculoskeletal: Unremarkable Review of the MIP images confirms the above findings. IMPRESSION: 1. No acute aortic findings. 2. Mild cardiomegaly and left ventricular hypertrophy. 3. Solid 2.7 cm left thyroid nodule. Recommend thyroid US (ref: J Am Coll Radiol. 2015  Feb;12(2): 143-50). 4. Aortic Atherosclerosis (ICD10-I70.0). 5. Possible hepatic steatosis. 6. Cholelithiasis. 7. Mild subpleural reticular interstitial accentuation in the lungs, nonspecific. No substantial advancement compared to the appearance of the lung bases on 01/15/2020. Electronically Signed   By: Van Clines M.D.   On: 01/22/2022 15:20   DG Chest 1 View  Result Date: 01/22/2022 CLINICAL DATA:  Headache, chest pain, shortness of breath EXAM: CHEST  1 VIEW COMPARISON:  01/10/2022 FINDINGS: Similar pulmonary vascular congestion and mild interstitial prominence. No pleural effusion. No pneumothorax. Stable mild cardiomegaly. IMPRESSION: Mild cardiomegaly. Pulmonary vascular congestion and mild interstitial prominence that may reflect edema. Electronically Signed   By: Macy Mis M.D.   On: 01/22/2022 09:42   CT Head Wo Contrast  Result Date: 01/22/2022 CLINICAL DATA:  Head trauma, abnormal mental status (Age 50-64y) EXAM: CT HEAD WITHOUT CONTRAST TECHNIQUE: Contiguous axial images were obtained from the base of the skull through the vertex without intravenous contrast. RADIATION DOSE REDUCTION: This exam was performed according to the departmental dose-optimization program which includes automated exposure control, adjustment of the mA and/or kV according to patient size and/or use of iterative reconstruction technique. COMPARISON:  12/30/2021 FINDINGS: Brain: There is no acute intracranial hemorrhage, mass effect, or edema. No new loss of gray-white differentiation. 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 Ventricles and sulci are stable in size and configuration. No extra-axial collection. Vascular: There is atherosclerotic calcification at the skull base. Skull: Calvarium is unremarkable. Sinuses/Orbits: No acute finding. Other: None. IMPRESSION: No acute intracranial abnormality. Stable chronic/nonemergent findings detailed above.  Electronically Signed   By: Macy Mis M.D.   On: 01/22/2022 09:36   DG Chest Portable 1 View  Result Date: 01/10/2022 CLINICAL DATA:  Shortness of breath EXAM: PORTABLE CHEST 1 VIEW COMPARISON:  Chest x-ray dated Jan 01, 2022 FINDINGS: Cardiac and mediastinal contours are unchanged. Mild bibasilar opacities. No large pleural effusion or pneumothorax. IMPRESSION: Mild bibasilar opacities, likely due to atelectasis. Electronically Signed   By: Yetta Glassman M.D.   On: 01/10/2022 10:02     Subjective: No acute issues or events overnight   Discharge Exam: Vitals:   02/04/22 0809 02/04/22 0936  BP:  111/66  Pulse:  61  Resp:  17  Temp:  97.7 F (36.5 C)  SpO2: 97% 100%   Vitals:   02/03/22 2352 02/04/22 0537 02/04/22 0809 02/04/22 0936  BP: (!) 101/55 (!) 105/56  111/66  Pulse: (!) 59 (!) 59  61  Resp: '14 16  17  ' Temp: 98.4 F (36.9 C) 98.7 F (37.1 C)  97.7 F (36.5 C)  TempSrc: Oral Oral  Oral  SpO2: 95% 98% 97% 100%  Weight:  98.1 kg    Height:        General: Pt is alert, awake, not in acute distress Cardiovascular: RRR, S1/S2 +, no rubs, no gallops Respiratory: CTA bilaterally, no wheezing, no rhonchi Abdominal: Soft, NT, ND, bowel sounds + Extremities: Scant bilateral lower extremity edema    The results of significant diagnostics from this hospitalization (including imaging, microbiology, ancillary and laboratory) are listed below for reference.     Microbiology: No results found for this or any previous visit (from the past 240 hour(s)).   Labs: BNP (last 3 results) Recent Labs    12/20/21 0526 01/10/22 0948 01/22/22 0845  BNP 202.9* 464.3* 817.7*   Basic Metabolic Panel: Recent Labs  Lab 01/31/22 0415 02/01/22 0605 02/02/22 0550 02/03/22 0045 02/04/22 0204  NA 133* 134* 135 130* 131*  K 4.2 4.2 4.1 4.2 4.2  CL 102 100 102 100 104  CO2 '24 23 23 ' 20* 20*  GLUCOSE 124* 106* 85 128* 125*  BUN 66* 70* 71* 73* 73*  CREATININE 4.04* 3.96*  3.46* 3.56* 3.54*  CALCIUM 8.5* 9.0 8.9 8.5* 8.4*  PHOS 5.3* 4.7* 4.7* 4.5 3.9   Liver Function Tests: Recent Labs  Lab 01/31/22 0415 02/01/22 0605 02/02/22 0550 02/03/22 0045 02/04/22 0204  ALBUMIN 2.4* 2.4* 2.4* 2.3* 2.4*   No results for input(s): "LIPASE", "AMYLASE" in the last 168 hours. No results for input(s): "AMMONIA" in the last 168 hours. CBC: No results for input(s): "WBC", "NEUTROABS", "HGB", "HCT", "MCV", "PLT" in the last 168 hours. Cardiac Enzymes: No results for input(s): "CKTOTAL", "CKMB", "CKMBINDEX", "TROPONINI" in the last 168 hours. BNP: Invalid input(s): "POCBNP" CBG: Recent Labs  Lab 02/03/22 0627 02/03/22 1157 02/03/22 1616 02/04/22 0621 02/04/22 0939  GLUCAP 86 162* 185* 95 158*   D-Dimer No results for input(s): "DDIMER" in the last 72 hours. Hgb A1c No results for input(s): "HGBA1C" in the last 72 hours. Lipid Profile No results for input(s): "CHOL", "HDL", "LDLCALC", "TRIG", "CHOLHDL", "LDLDIRECT" in the last 72 hours. Thyroid function studies No results for input(s): "TSH", "T4TOTAL", "T3FREE", "THYROIDAB" in the last 72 hours.  Invalid input(s): "FREET3" Anemia work up No results for input(s): "VITAMINB12", "FOLATE", "FERRITIN", "TIBC", "IRON", "RETICCTPCT" in the last 72 hours. Urinalysis    Component Value Date/Time   COLORURINE YELLOW 01/24/2022 1024   APPEARANCEUR HAZY (A) 01/24/2022 1024   LABSPEC 1.024 01/24/2022 1024   PHURINE 5.0 01/24/2022 1024   GLUCOSEU 50 (A) 01/24/2022 1024   HGBUR NEGATIVE 01/24/2022 1024   BILIRUBINUR NEGATIVE 01/24/2022 1024   KETONESUR NEGATIVE 01/24/2022 1024   PROTEINUR >=300 (A) 01/24/2022 1024   UROBILINOGEN 0.2 01/29/2014 2116   NITRITE NEGATIVE 01/24/2022 1024   LEUKOCYTESUR NEGATIVE 01/24/2022 1024   Sepsis Labs No results for input(s): "WBC" in the last 168 hours.  Invalid input(s): "PROCALCITONIN", "LACTICIDVEN" Microbiology No results found for this or any previous visit (from  the past 240 hour(s)).   Time coordinating discharge: Over 30 minutes  SIGNED:   Little Ishikawa, DO [facility] 02/04/2022, 1:26 PM Pager   If 7PM-7AM, please contact night-coverage [email or web address]

## 2022-02-06 ENCOUNTER — Ambulatory Visit: Payer: Medicare Other | Admitting: Internal Medicine

## 2022-02-06 ENCOUNTER — Telehealth: Payer: Self-pay | Admitting: Internal Medicine

## 2022-02-06 NOTE — Telephone Encounter (Signed)
Pt c/o Shortness Of Breath: STAT if SOB developed within the last 24 hours or pt is noticeably SOB on the phone  1. Are you currently SOB (can you hear that pt is SOB on the phone)?  Patient states she is SOB, but I cannot hear it    2. How long have you been experiencing SOB?  Past few months, on and off   3. Are you SOB when sitting or when up moving around?  Mainly when up and moving and around  4. Are you currently experiencing any other symptoms?  States she has been having kidney issues

## 2022-02-06 NOTE — Progress Notes (Deleted)
Cardiology Office Note:    Date:  02/06/2022   ID:  [name], DOB 05-25-1961, MRN [id number]  PCP:  Simona Huh, NP   [facility] Providers Cardiologist:  Janina Mayo, MD { Click to update primary MD,subspecialty MD or APP then REFRESH:1}    Referring MD: Simona Huh, NP   No chief complaint on file. Hospital FU  History of Present Illness:    Carol [name] is a 61 y.o. female with a hx of susbtance abuse with subsequent ARF (crt up to 6), HTN, mild ischemic heart disease, thyroid nodule (normal thyroid fxn), T2DM, prolonged QT (514 ms) here for hospital FU  Carol [name] has hx of challenging to control hypertension with hx of persistent chest pain. She struggles with cocaine abuse but is working on abstinence with her family. She presented with NSTEMI in early May and underwent LHC. She had a small sub-Tresean Mattix off the PDA lesion that was managed with DAPT. She returned post cocaine use with hypertensive emergency and acute renal failure. She was admitted 6/5-6/18. Her new baseline is CKD Stage IV with GFR 15-16. Plan is for nephrology follow up.   Cardiology Studies:  Cath 12/20/21   Inf Sept lesion is 100% stenosed.   Mid RCA lesion is 20% stenosed.   Mid LAD lesion is 20% stenosed.   NSTEMI with culprit vessel felt to be a very small sub-Wynter Isaacs off of the right PDA. This is a 1.5 mm vessel and supplies a small portion of myocardium.  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   Chronic back pain    Depression    Diabetes mellitus    GERD (gastroesophageal reflux disease)    Heart murmur    Hypertension    NSTEMI (non-ST  elevated myocardial infarction) ([place]) 12/20/2021   Palpitations    Parkinson's disease ([place])     Past Surgical History:  Procedure Laterality Date   CARPAL TUNNEL RELEASE     IR FLUORO GUIDE CV LINE RIGHT  01/26/2022   IR US GUIDE VASC ACCESS RIGHT  01/26/2022   LEFT HEART CATH AND CORONARY ANGIOGRAPHY N/A 12/20/2021   Procedure: LEFT HEART CATH AND CORONARY ANGIOGRAPHY;  Surgeon: Burnell Blanks, MD;  Location: [facility];  Service: Cardiovascular;  Laterality: N/A;   OPEN REDUCTION INTERNAL FIXATION (ORIF) FINGER WITH RADIAL BONE GRAFT Right 09/12/2015   Procedure: RIGHT MIDDLE FINGER CLOSED REDUCTION AND PINNING;  Surgeon: Iran Planas, MD;  Location: [facility];  Service: Orthopedics;  Laterality: Right;   TONSILLECTOMY     TUBAL LIGATION      Current Medications: No outpatient medications have been marked as taking for the 02/06/22 encounter (Appointment) with Janina Mayo, MD.     Allergies:   Penicillins and Tramadol   Social History   Socioeconomic History   Marital status: Single    Spouse name: Not on file   Number of children: 3   Years of education: Not on file   Highest education level: Not on file  Occupational History   Not on file  Tobacco Use   Smoking status: Some Days    Packs/day: 0.25  Years: 3.00    Total pack years: 0.75    Types: Cigarettes   Smokeless tobacco: Never  Vaping Use   Vaping Use: Never used  Substance and Sexual Activity   Alcohol use: Yes    Alcohol/week: 0.0 standard drinks of alcohol    Comment: occasional   Drug use: No    Comment: none for 13 years   Sexual activity: Not on file  Other Topics Concern   Not on file  Social History Narrative   Not on file   Social Determinants of Health   Financial Resource Strain: Not on file  Food Insecurity: Not on file  Transportation Needs: Not on file  Physical Activity: Not on file  Stress: Not on file  Social Connections: Not on file     Family History: The  patient's ***family history includes Asthma in an other family member; Diabetes in her mother; Hypertension in her father; Kidney disease in her mother; Seizures in her father; Stroke in her father.  ROS:   Please see the history of present illness.    *** All other systems reviewed and are negative.  EKGs/Labs/Other Studies Reviewed:    The following studies were reviewed today: ***  EKG:  EKG is *** ordered today.  The ekg ordered today demonstrates ***  Recent Labs: 01/10/2022: Magnesium 1.7 01/22/2022: B Natriuretic Peptide 618.4 01/24/2022: TSH 1.374 01/25/2022: ALT 10 01/28/2022: Hemoglobin 9.2; Platelets 161 02/04/2022: BUN 73; Creatinine, Ser 3.54; Potassium 4.2; Sodium 131  Recent Lipid Panel    Component Value Date/Time   CHOL 268 (H) 12/20/2021 0351   TRIG 183 (H) 12/20/2021 0351   HDL 57 12/20/2021 0351   CHOLHDL 4.7 12/20/2021 0351   VLDL 37 12/20/2021 0351   LDLCALC 174 (H) 12/20/2021 0351   LDLCALC  11/21/2020 1451     Comment:     . LDL cholesterol not calculated. Triglyceride levels greater than 400 mg/dL invalidate calculated LDL results. . Reference range: <100 . Desirable range <100 mg/dL for primary prevention;   <70 mg/dL for patients with CHD or diabetic patients  with > or = 2 CHD risk factors. Marland Kitchen LDL-C is now calculated using the Martin-Hopkins  calculation, which is a validated novel method providing  better accuracy than the Friedewald equation in the  estimation of LDL-C.  Cresenciano Genre et al. Annamaria Helling. 6834;196(22): 2061-2068  (http://education.[employer].com/faq/FAQ164)      Risk Assessment/Calculations:   {Does this patient have ATRIAL FIBRILLATION?:[phone]}       Physical Exam:    VS:  There were no vitals taken for this visit.    Wt Readings from Last 3 Encounters:  02/04/22 216 lb 4.3 oz (98.1 kg)  01/02/22 216 lb 11.4 oz (98.3 kg)  12/25/21 170 lb (77.1 kg)     GEN: *** Well nourished, well developed in no acute  distress HEENT: Normal NECK: No JVD; No carotid bruits LYMPHATICS: No lymphadenopathy CARDIAC: ***RRR, no murmurs, rubs, gallops RESPIRATORY:  Clear to auscultation without rales, wheezing or rhonchi  ABDOMEN: Soft, non-tender, non-distended MUSCULOSKELETAL:  No edema; No deformity  SKIN: Warm and dry NEUROLOGIC:  Alert and oriented x 3 PSYCHIATRIC:  Normal affect   ASSESSMENT:   #Hypertensive emergency: in the setting of cocaine use. Blood pressures still elevated. Will be more aggressive with her blood pressure. - continue home nebivolol 20 mg daily - continue norvasc 10 mg daily - continue hydralazine 50 mg TID - stopped home  ARB; can't do spironolactone with renal function.    #  Ischemic Heart Disease: small sub Darlina Mccaughey off the R PDA disease. Continue aspirin 81 mg daily and plavix 75 mg daily (until 12/21/2022). Continue lipitor 80 mg daily  #Prolonged Qtc: 514 ms on prior EKG. Avoid QT prolonging medications   #Diastolic CHF: in the setting of cocaine use. Her BNP was elevated to 600s and she was c/o SOB. She is not floridly volume overloaded.  Diuretics per nephrology   #AKI: Crt 1.79 -> 2.48->4.0.  in the setting of hypertensive emergency, post contrast and cocaine use. Continuing to increase. Per  nephrology   #Substance abuse: she plans to live with her daughter for awhile for support and work on abstinence of cocaine. PLAN:    In order of problems listed above:  ***  {The patient has an active order for outpatient cardiac rehabilitation.   Please indicate if the patient is ready to start. Do NOT delete this.  It will auto delete.  Refresh note, then sign.              Click here to document readiness and see contraindications.  :1}  Cardiac Rehabilitation Eligibility Assessment       {Are you ordering a CV Procedure (e.g. stress test, cath, DCCV, TEE, etc)?   Press F2        :[id number]}    Medication Adjustments/Labs and Tests Ordered: Current medicines are  reviewed at length with the patient today.  Concerns regarding medicines are outlined above.  No orders of the defined types were placed in this encounter.  No orders of the defined types were placed in this encounter.   There are no Patient Instructions on file for this visit.   Signed, Janina Mayo, MD  02/06/2022 8:16 AM    [facility]

## 2022-02-06 NOTE — Telephone Encounter (Signed)
LMTCB

## 2022-02-08 NOTE — Telephone Encounter (Signed)
Called [phone] and was told this is no longer the patient's phone number.

## 2022-02-08 NOTE — Telephone Encounter (Signed)
Left message on [place] cell to have patient call clinic.

## 2022-03-06 ENCOUNTER — Ambulatory Visit: Payer: Medicare Other | Admitting: Internal Medicine

## 2022-03-10 ENCOUNTER — Emergency Department (HOSPITAL_COMMUNITY): Payer: Medicare Other

## 2022-03-10 ENCOUNTER — Inpatient Hospital Stay (HOSPITAL_COMMUNITY)
Admission: EM | Admit: 2022-03-10 | Discharge: 2022-03-17 | DRG: 637 | Disposition: A | Discharge status: Home / Self Care | Payer: Medicare Other | Attending: Internal Medicine | Admitting: Internal Medicine

## 2022-03-10 ENCOUNTER — Encounter (HOSPITAL_COMMUNITY): Payer: Self-pay | Admitting: Internal Medicine

## 2022-03-10 DIAGNOSIS — Z91148 Patient's other noncompliance with medication regimen for other reason: Secondary | ICD-10-CM

## 2022-03-10 DIAGNOSIS — Z841 Family history of disorders of kidney and ureter: Secondary | ICD-10-CM

## 2022-03-10 DIAGNOSIS — G8929 Other chronic pain: Secondary | ICD-10-CM | POA: Diagnosis present

## 2022-03-10 DIAGNOSIS — N184 Chronic kidney disease, stage 4 (severe): Secondary | ICD-10-CM

## 2022-03-10 DIAGNOSIS — G20 Parkinson's disease: Secondary | ICD-10-CM | POA: Diagnosis present

## 2022-03-10 DIAGNOSIS — E11649 Type 2 diabetes mellitus with hypoglycemia without coma: Secondary | ICD-10-CM | POA: Diagnosis not present

## 2022-03-10 DIAGNOSIS — Z91119 Patient's noncompliance with dietary regimen due to unspecified reason: Secondary | ICD-10-CM

## 2022-03-10 DIAGNOSIS — E669 Obesity, unspecified: Secondary | ICD-10-CM | POA: Diagnosis present

## 2022-03-10 DIAGNOSIS — Z7984 Long term (current) use of oral hypoglycemic drugs: Secondary | ICD-10-CM

## 2022-03-10 DIAGNOSIS — E119 Type 2 diabetes mellitus without complications: Secondary | ICD-10-CM

## 2022-03-10 DIAGNOSIS — E1122 Type 2 diabetes mellitus with diabetic chronic kidney disease: Secondary | ICD-10-CM | POA: Diagnosis present

## 2022-03-10 DIAGNOSIS — Z6837 Body mass index (BMI) 37.0-37.9, adult: Secondary | ICD-10-CM

## 2022-03-10 DIAGNOSIS — K732 Chronic active hepatitis, not elsewhere classified: Secondary | ICD-10-CM | POA: Diagnosis present

## 2022-03-10 DIAGNOSIS — E162 Hypoglycemia, unspecified: Secondary | ICD-10-CM | POA: Diagnosis not present

## 2022-03-10 DIAGNOSIS — I509 Heart failure, unspecified: Secondary | ICD-10-CM

## 2022-03-10 DIAGNOSIS — Z880 Allergy status to penicillin: Secondary | ICD-10-CM

## 2022-03-10 DIAGNOSIS — D631 Anemia in chronic kidney disease: Secondary | ICD-10-CM | POA: Diagnosis present

## 2022-03-10 DIAGNOSIS — Z833 Family history of diabetes mellitus: Secondary | ICD-10-CM

## 2022-03-10 DIAGNOSIS — I252 Old myocardial infarction: Secondary | ICD-10-CM

## 2022-03-10 DIAGNOSIS — F17210 Nicotine dependence, cigarettes, uncomplicated: Secondary | ICD-10-CM | POA: Diagnosis present

## 2022-03-10 DIAGNOSIS — Z888 Allergy status to other drugs, medicaments and biological substances status: Secondary | ICD-10-CM

## 2022-03-10 DIAGNOSIS — N19 Unspecified kidney failure: Secondary | ICD-10-CM

## 2022-03-10 DIAGNOSIS — Z79899 Other long term (current) drug therapy: Secondary | ICD-10-CM

## 2022-03-10 DIAGNOSIS — K219 Gastro-esophageal reflux disease without esophagitis: Secondary | ICD-10-CM | POA: Diagnosis present

## 2022-03-10 DIAGNOSIS — E1169 Type 2 diabetes mellitus with other specified complication: Secondary | ICD-10-CM

## 2022-03-10 DIAGNOSIS — Z794 Long term (current) use of insulin: Secondary | ICD-10-CM

## 2022-03-10 DIAGNOSIS — F419 Anxiety disorder, unspecified: Secondary | ICD-10-CM | POA: Diagnosis present

## 2022-03-10 DIAGNOSIS — I130 Hypertensive heart and chronic kidney disease with heart failure and stage 1 through stage 4 chronic kidney disease, or unspecified chronic kidney disease: Secondary | ICD-10-CM | POA: Diagnosis present

## 2022-03-10 DIAGNOSIS — F32A Depression, unspecified: Secondary | ICD-10-CM | POA: Diagnosis present

## 2022-03-10 DIAGNOSIS — Z79890 Hormone replacement therapy (postmenopausal): Secondary | ICD-10-CM

## 2022-03-10 DIAGNOSIS — I5033 Acute on chronic diastolic (congestive) heart failure: Secondary | ICD-10-CM | POA: Diagnosis present

## 2022-03-10 DIAGNOSIS — E7800 Pure hypercholesterolemia, unspecified: Secondary | ICD-10-CM | POA: Diagnosis present

## 2022-03-10 DIAGNOSIS — G9341 Metabolic encephalopathy: Secondary | ICD-10-CM | POA: Diagnosis present

## 2022-03-10 DIAGNOSIS — F418 Other specified anxiety disorders: Secondary | ICD-10-CM | POA: Diagnosis present

## 2022-03-10 DIAGNOSIS — Z9851 Tubal ligation status: Secondary | ICD-10-CM

## 2022-03-10 DIAGNOSIS — M549 Dorsalgia, unspecified: Secondary | ICD-10-CM | POA: Diagnosis present

## 2022-03-10 DIAGNOSIS — I2510 Atherosclerotic heart disease of native coronary artery without angina pectoris: Secondary | ICD-10-CM | POA: Diagnosis present

## 2022-03-10 DIAGNOSIS — Z7902 Long term (current) use of antithrombotics/antiplatelets: Secondary | ICD-10-CM

## 2022-03-10 DIAGNOSIS — Z7982 Long term (current) use of aspirin: Secondary | ICD-10-CM

## 2022-03-10 LAB — URINALYSIS, ROUTINE W REFLEX MICROSCOPIC
Bilirubin Urine: NEGATIVE
Glucose, UA: NEGATIVE mg/dL
Hgb urine dipstick: NEGATIVE
Ketones, ur: NEGATIVE mg/dL
Leukocytes,Ua: NEGATIVE
Nitrite: NEGATIVE
Protein, ur: 100 mg/dL — AB
Specific Gravity, Urine: 1.008 (ref 1.005–1.030)
pH: 8 (ref 5.0–8.0)

## 2022-03-10 LAB — CBG MONITORING, ED
Glucose-Capillary: 107 mg/dL — ABNORMAL HIGH (ref 70–99)
Glucose-Capillary: 130 mg/dL — ABNORMAL HIGH (ref 70–99)
Glucose-Capillary: 130 mg/dL — ABNORMAL HIGH (ref 70–99)
Glucose-Capillary: 140 mg/dL — ABNORMAL HIGH (ref 70–99)
Glucose-Capillary: 147 mg/dL — ABNORMAL HIGH (ref 70–99)
Glucose-Capillary: 149 mg/dL — ABNORMAL HIGH (ref 70–99)
Glucose-Capillary: 159 mg/dL — ABNORMAL HIGH (ref 70–99)
Glucose-Capillary: 170 mg/dL — ABNORMAL HIGH (ref 70–99)
Glucose-Capillary: 181 mg/dL — ABNORMAL HIGH (ref 70–99)
Glucose-Capillary: 197 mg/dL — ABNORMAL HIGH (ref 70–99)
Glucose-Capillary: 204 mg/dL — ABNORMAL HIGH (ref 70–99)
Glucose-Capillary: 49 mg/dL — ABNORMAL LOW (ref 70–99)
Glucose-Capillary: 51 mg/dL — ABNORMAL LOW (ref 70–99)

## 2022-03-10 LAB — COMPREHENSIVE METABOLIC PANEL
ALT: 16 U/L (ref 0–44)
AST: 36 U/L (ref 15–41)
Albumin: 2.4 g/dL — ABNORMAL LOW (ref 3.5–5.0)
Alkaline Phosphatase: 134 U/L — ABNORMAL HIGH (ref 38–126)
Anion gap: 8 (ref 5–15)
BUN: 27 mg/dL — ABNORMAL HIGH (ref 6–20)
CO2: 21 mmol/L — ABNORMAL LOW (ref 22–32)
Calcium: 7.9 mg/dL — ABNORMAL LOW (ref 8.9–10.3)
Chloride: 106 mmol/L (ref 98–111)
Creatinine, Ser: 2.93 mg/dL — ABNORMAL HIGH (ref 0.44–1.00)
GFR, Estimated: 18 mL/min — ABNORMAL LOW (ref >60)
Glucose, Bld: 59 mg/dL — ABNORMAL LOW (ref 70–99)
Potassium: 3.7 mmol/L (ref 3.5–5.1)
Sodium: 135 mmol/L (ref 135–145)
Total Bilirubin: 0.6 mg/dL (ref 0.3–1.2)
Total Protein: 6 g/dL — ABNORMAL LOW (ref 6.5–8.1)

## 2022-03-10 LAB — CBC WITH DIFFERENTIAL/PLATELET
Abs Immature Granulocytes: 0.02 10*3/uL (ref 0.00–0.07)
Basophils Absolute: 0 10*3/uL (ref 0.0–0.1)
Basophils Relative: 0 %
Eosinophils Absolute: 0.2 10*3/uL (ref 0.0–0.5)
Eosinophils Relative: 5 %
HCT: 26.3 % — ABNORMAL LOW (ref 36.0–46.0)
Hemoglobin: 8.4 g/dL — ABNORMAL LOW (ref 12.0–15.0)
Immature Granulocytes: 0 %
Lymphocytes Relative: 26 %
Lymphs Abs: 1.2 10*3/uL (ref 0.7–4.0)
MCH: 28 pg (ref 26.0–34.0)
MCHC: 31.9 g/dL (ref 30.0–36.0)
MCV: 87.7 fL (ref 80.0–100.0)
Monocytes Absolute: 0.8 10*3/uL (ref 0.1–1.0)
Monocytes Relative: 17 %
Neutro Abs: 2.4 10*3/uL (ref 1.7–7.7)
Neutrophils Relative %: 52 %
Platelets: 220 10*3/uL (ref 150–400)
RBC: 3 MIL/uL — ABNORMAL LOW (ref 3.87–5.11)
RDW: 14.5 % (ref 11.5–15.5)
WBC: 4.7 10*3/uL (ref 4.0–10.5)
nRBC: 0 % (ref 0.0–0.2)

## 2022-03-10 LAB — GLUCOSE, CAPILLARY: Glucose-Capillary: 133 mg/dL — ABNORMAL HIGH (ref 70–99)

## 2022-03-10 LAB — TROPONIN I (HIGH SENSITIVITY)
Troponin I (High Sensitivity): 17 ng/L (ref <18)
Troponin I (High Sensitivity): 16 ng/L (ref <18)

## 2022-03-10 LAB — BRAIN NATRIURETIC PEPTIDE: B Natriuretic Peptide: 687.1 pg/mL — ABNORMAL HIGH (ref 0.0–100.0)

## 2022-03-10 MED ORDER — DEXTROSE 10 % IV SOLN: INTRAVENOUS | Status: DC

## 2022-03-10 MED ORDER — ASPIRIN 81 MG PO TBEC
81.0000 mg | DELAYED_RELEASE_TABLET | Freq: Every day | ORAL | Status: DC
Start: 2022-03-10 — End: 2022-03-17
  Administered 2022-03-10 – 2022-03-17 (×8): 81 mg via ORAL
  Filled 2022-03-10 (×8): qty 1

## 2022-03-10 MED ORDER — FUROSEMIDE 10 MG/ML IJ SOLN: 40.0000 mg | Freq: Every day | INTRAMUSCULAR | Status: DC

## 2022-03-10 MED ORDER — FLUTICASONE FUROATE-VILANTEROL 200-25 MCG/ACT IN AEPB
1.0000 | INHALATION_SPRAY | Freq: Every day | RESPIRATORY_TRACT | Status: DC
  Administered 2022-03-11 – 2022-03-15 (×2): 1 via RESPIRATORY_TRACT
  Filled 2022-03-10: qty 28

## 2022-03-10 MED ORDER — HEPARIN SODIUM (PORCINE) 5000 UNIT/ML IJ SOLN
5000.0000 [IU] | Freq: Three times a day (TID) | INTRAMUSCULAR | Status: DC
Start: 2022-03-10 — End: 2022-03-17
  Administered 2022-03-10 – 2022-03-17 (×22): 5000 [IU] via SUBCUTANEOUS
  Filled 2022-03-10 (×21): qty 1

## 2022-03-10 MED ORDER — INSULIN ASPART 100 UNIT/ML IJ SOLN
0.0000 [IU] | Freq: Every day | INTRAMUSCULAR | Status: DC
  Administered 2022-03-11 – 2022-03-16 (×3): 2 [IU] via SUBCUTANEOUS

## 2022-03-10 MED ORDER — HYDRALAZINE HCL 25 MG PO TABS
25.0000 mg | ORAL_TABLET | Freq: Three times a day (TID) | ORAL | Status: DC
  Administered 2022-03-10 – 2022-03-17 (×21): 25 mg via ORAL
  Filled 2022-03-10 (×21): qty 1

## 2022-03-10 MED ORDER — PANTOPRAZOLE SODIUM 40 MG PO TBEC
40.0000 mg | DELAYED_RELEASE_TABLET | Freq: Every day | ORAL | Status: DC
  Administered 2022-03-11 – 2022-03-17 (×7): 40 mg via ORAL
  Filled 2022-03-10 (×7): qty 1

## 2022-03-10 MED ORDER — DEXTROSE 50 % IV SOLN
INTRAVENOUS | Status: AC
  Administered 2022-03-10: 50 mL via INTRAVENOUS
  Filled 2022-03-10: qty 50

## 2022-03-10 MED ORDER — INSULIN ASPART 100 UNIT/ML IJ SOLN
0.0000 [IU] | Freq: Three times a day (TID) | INTRAMUSCULAR | Status: DC
  Administered 2022-03-11: 5 [IU] via SUBCUTANEOUS
  Administered 2022-03-11 – 2022-03-12 (×3): 2 [IU] via SUBCUTANEOUS
  Administered 2022-03-12: 1 [IU] via SUBCUTANEOUS
  Administered 2022-03-13: 2 [IU] via SUBCUTANEOUS
  Administered 2022-03-13: 1 [IU] via SUBCUTANEOUS
  Administered 2022-03-14: 3 [IU] via SUBCUTANEOUS
  Administered 2022-03-14: 1 [IU] via SUBCUTANEOUS
  Administered 2022-03-15: 2 [IU] via SUBCUTANEOUS
  Administered 2022-03-15: 3 [IU] via SUBCUTANEOUS
  Administered 2022-03-16 (×3): 2 [IU] via SUBCUTANEOUS
  Administered 2022-03-17: 1 [IU] via SUBCUTANEOUS
  Administered 2022-03-17: 5 [IU] via SUBCUTANEOUS

## 2022-03-10 MED ORDER — CLOPIDOGREL BISULFATE 75 MG PO TABS
75.0000 mg | ORAL_TABLET | Freq: Every day | ORAL | Status: DC
  Administered 2022-03-10 – 2022-03-17 (×8): 75 mg via ORAL
  Filled 2022-03-10 (×8): qty 1

## 2022-03-10 MED ORDER — SERTRALINE HCL 50 MG PO TABS
50.0000 mg | ORAL_TABLET | Freq: Every evening | ORAL | Status: DC
  Administered 2022-03-10 – 2022-03-16 (×7): 50 mg via ORAL
  Filled 2022-03-10 (×7): qty 1

## 2022-03-10 MED ORDER — FUROSEMIDE 10 MG/ML IJ SOLN
40.0000 mg | Freq: Two times a day (BID) | INTRAMUSCULAR | Status: DC
  Administered 2022-03-10 – 2022-03-16 (×13): 40 mg via INTRAVENOUS
  Filled 2022-03-10 (×13): qty 4

## 2022-03-10 MED ORDER — FUROSEMIDE 10 MG/ML IJ SOLN
40.0000 mg | Freq: Two times a day (BID) | INTRAMUSCULAR | Status: DC
Start: 2022-03-10 — End: 2022-03-10

## 2022-03-10 MED ORDER — NITROGLYCERIN 0.4 MG SL SUBL
0.4000 mg | SUBLINGUAL_TABLET | SUBLINGUAL | Status: DC | PRN
Start: 2022-03-10 — End: 2022-03-17

## 2022-03-10 MED ORDER — ATORVASTATIN CALCIUM 80 MG PO TABS
80.0000 mg | ORAL_TABLET | Freq: Every day | ORAL | Status: DC
  Administered 2022-03-10 – 2022-03-17 (×8): 80 mg via ORAL
  Filled 2022-03-10 (×5): qty 1
  Filled 2022-03-10: qty 2
  Filled 2022-03-10 (×2): qty 1

## 2022-03-10 MED ORDER — HYDRALAZINE HCL 20 MG/ML IJ SOLN
5.0000 mg | INTRAMUSCULAR | Status: DC | PRN
  Filled 2022-03-10: qty 1

## 2022-03-10 MED ORDER — LORATADINE 10 MG PO TABS
10.0000 mg | ORAL_TABLET | Freq: Every day | ORAL | Status: DC
  Administered 2022-03-10 – 2022-03-17 (×8): 10 mg via ORAL
  Filled 2022-03-10 (×8): qty 1

## 2022-03-10 MED ORDER — AMLODIPINE BESYLATE 10 MG PO TABS
10.0000 mg | ORAL_TABLET | Freq: Every day | ORAL | Status: DC
  Administered 2022-03-10 – 2022-03-17 (×8): 10 mg via ORAL
  Filled 2022-03-10 (×3): qty 1
  Filled 2022-03-10: qty 2
  Filled 2022-03-10 (×4): qty 1

## 2022-03-10 MED ORDER — NEBIVOLOL HCL 20 MG PO TABS: 20.0000 mg | ORAL_TABLET | Freq: Every morning | ORAL | Status: DC

## 2022-03-10 MED ORDER — DEXTROSE 50 % IV SOLN
INTRAVENOUS | Status: AC
  Administered 2022-03-10: 50 mL
  Filled 2022-03-10: qty 50

## 2022-03-10 MED ORDER — ACETAMINOPHEN 650 MG RE SUPP: 650.0000 mg | Freq: Four times a day (QID) | RECTAL | Status: DC | PRN

## 2022-03-10 MED ORDER — ACETAMINOPHEN 325 MG PO TABS
650.0000 mg | ORAL_TABLET | Freq: Four times a day (QID) | ORAL | Status: DC | PRN
  Administered 2022-03-10 – 2022-03-13 (×5): 650 mg via ORAL
  Filled 2022-03-10 (×5): qty 2

## 2022-03-10 MED ORDER — QUETIAPINE FUMARATE ER 300 MG PO TB24
300.0000 mg | ORAL_TABLET | Freq: Every day | ORAL | Status: DC
  Administered 2022-03-10 – 2022-03-16 (×7): 300 mg via ORAL
  Filled 2022-03-10 (×9): qty 1

## 2022-03-10 MED ORDER — ALBUTEROL SULFATE (2.5 MG/3ML) 0.083% IN NEBU
2.5000 mg | INHALATION_SOLUTION | RESPIRATORY_TRACT | Status: DC | PRN
  Administered 2022-03-11 – 2022-03-13 (×5): 2.5 mg via RESPIRATORY_TRACT
  Filled 2022-03-10 (×5): qty 3

## 2022-03-10 MED ORDER — FUROSEMIDE 10 MG/ML IJ SOLN
40.0000 mg | Freq: Once | INTRAMUSCULAR | Status: AC
Start: 2022-03-10 — End: 2022-03-10
  Administered 2022-03-10: 40 mg via INTRAVENOUS
  Filled 2022-03-10: qty 4

## 2022-03-10 MED ORDER — DEXTROSE 50 % IV SOLN: 50.0000 mL | Freq: Once | INTRAVENOUS | Status: AC

## 2022-03-10 MED ORDER — BUPRENORPHINE HCL-NALOXONE HCL 2-0.5 MG SL SUBL
0.5000 | SUBLINGUAL_TABLET | Freq: Every day | SUBLINGUAL | Status: DC
  Administered 2022-03-10 – 2022-03-17 (×8): 0.5 via SUBLINGUAL
  Filled 2022-03-10 (×8): qty 1

## 2022-03-10 NOTE — ED Notes (Signed)
Pts husband hit the IV silence button while this NT was in the room checking Pt's glucose. NT politely asked for them to not touch the IV machine.
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
## 2022-03-10 NOTE — ED Provider Notes (Signed)
[facility] EMERGENCY DEPARTMENT Provider Note   CSN: [id number] Arrival date & time: 03/10/22  0041     History  Chief Complaint  Patient presents with   Hypoglycemia    Carol [name] is a 61 y.o. female.  61 year old female who presents the ER today with recurrent hypoglycemia.  Patient admitted to the hospital couple times for this in the past.  She has ongoing chronic kidney disease and has not started dialysis yet but its been talked about multiple times.  She has not change her insulin dosing last couple weeks and she is felt overall okay but then tonight she was borderline unresponsive.  EMS arrived and her blood sugar was 27.  When they gave her D50 it improved and her mental status improved and she is asymptomatic on arrival.  Blood sugar improved to the 180s with the D50 but then within a short period after being here she dropped back down into the 50s.  Another amp of D50 was given she came back up in the 100s and is once again feeling feel well.  No recent illnesses.  No chest pain, headache or other associated symptoms.   Hypoglycemia      Home Medications Prior to Admission medications   Medication Sig Start Date End Date Taking? Authorizing Provider  albuterol (PROVENTIL HFA;VENTOLIN HFA) 108 (90 Base) MCG/ACT inhaler Inhale 2 puffs into the lungs every 4 (four) hours as needed for wheezing or shortness of breath.    [provider]  amLODipine (NORVASC) 10 MG tablet Take 1 tablet (10 mg total) by mouth daily. Patient taking differently: Take 10 mg by mouth every morning. 11/26/21   Varney Biles, MD  aspirin EC 81 MG EC tablet Take 1 tablet (81 mg total) by mouth daily. Swallow whole. Patient taking differently: Take 81 mg by mouth at bedtime. Swallow whole. 12/25/21   Dunn, Nedra Hai, PA-C  atorvastatin (LIPITOR) 80 MG tablet Take 1 tablet (80 mg total) by mouth daily. Patient taking differently: Take 80 mg by mouth every morning.  12/25/21   Dunn, Nedra Hai, PA-C  budesonide-formoterol (SYMBICORT) 160-4.5 MCG/ACT inhaler Inhale 2 puffs into the lungs every morning. 11/09/21   [provider]  cetirizine (ZYRTEC) 10 MG tablet Take 10 mg by mouth every morning.    [provider]  clopidogrel (PLAVIX) 75 MG tablet Take 1 tablet (75 mg total) by mouth daily. Patient taking differently: Take 75 mg by mouth every morning. 12/25/21   Dunn, Nedra Hai, PA-C  cyclobenzaprine (FLEXERIL) 10 MG tablet Take 1 tablet (10 mg total) by mouth 3 (three) times daily as needed for muscle spasms. 05/18/19   Domenic Moras, PA-C  ergocalciferol (VITAMIN D2) 1.25 MG (50000 UT) capsule Take 50,000 Units by mouth every Monday.    [provider]  furosemide (LASIX) 40 MG tablet Take 0.5 tablets (20 mg total) by mouth daily. 02/04/22   Little Ishikawa, MD  gabapentin (NEURONTIN) 100 MG capsule Take 100 mg by mouth every morning.    [provider]  glimepiride (AMARYL) 4 MG tablet Take 4 mg by mouth 2 (two) times daily. 01/12/22   [provider]  glucose blood (FREESTYLE TEST STRIPS) test strip 1 each by Other route as needed for other. 01/03/22   Shelly Coss, MD  hydrALAZINE (APRESOLINE) 50 MG tablet Take 1 tablet (50 mg total) by mouth every 8 (eight) hours. 01/03/22   Shelly Coss, MD  hydrOXYzine (ATARAX) 50 MG tablet Take 50  mg by mouth daily as needed for anxiety or itching.    [provider]  insulin glargine (LANTUS) 100 UNIT/ML Solostar Pen Inject 20 Units into the skin every morning. 02/04/22   Little Ishikawa, MD  insulin lispro (HUMALOG) 100 UNIT/ML KwikPen Inject 10 Units into the skin 3 (three) times daily after meals. 02/04/22   Little Ishikawa, MD  metFORMIN (GLUCOPHAGE) 500 MG tablet Take 500 mg by mouth 2 (two) times daily.    [provider]  Nebivolol HCl 20 MG TABS Take 20 mg by mouth every morning. 12/07/21   [provider]  oxyCODONE-acetaminophen  (PERCOCET/ROXICET) 5-325 MG tablet Take 1-2 tablets by mouth every 6 (six) hours as needed for severe pain. Patient not taking: Reported on 01/22/2022 08/31/15   Comer Locket, PA-C  pantoprazole (PROTONIX) 40 MG tablet Take 1 tablet (40 mg total) by mouth daily at 12 noon. Patient taking differently: Take 40 mg by mouth every morning. 02/02/14   Regalado, Belkys A, MD  QUEtiapine (SEROQUEL XR) 300 MG 24 hr tablet Take 300 mg by mouth at bedtime. 12/07/21   [provider]  sertraline (ZOLOFT) 50 MG tablet Take 50 mg by mouth every evening.    [provider]  triamcinolone ointment (KENALOG) 0.1 % Apply 1 application. topically 2 (two) times daily.    [provider]      Allergies    Penicillins and Tramadol    Review of Systems   Review of Systems  Physical Exam Updated Vital Signs BP (!) 162/91   Pulse 63   Temp 98.6 F (37 C) (Oral)   Resp 14   Ht 5\' 4"  (1.626 m)   Wt 98.1 kg   SpO2 97%   BMI 37.12 kg/m  Physical Exam Vitals and nursing note reviewed.  Constitutional:      Appearance: She is well-developed.  HENT:     Head: Normocephalic and atraumatic.  Eyes:     Pupils: Pupils are equal, round, and reactive to light.  Cardiovascular:     Rate and Rhythm: Normal rate and regular rhythm.  Pulmonary:     Effort: No respiratory distress.     Breath sounds: No stridor.  Abdominal:     General: Abdomen is flat. There is no distension.  Musculoskeletal:        General: No swelling or tenderness. Normal range of motion.     Cervical back: Normal range of motion.  Skin:    General: Skin is warm and dry.  Neurological:     General: No focal deficit present.     Mental Status: She is alert.     ED Results / Procedures / Treatments   Labs (all labs ordered are listed, but only abnormal results are displayed) Labs Reviewed  CBC WITH DIFFERENTIAL/PLATELET - Abnormal; Notable for the following components:      Result Value   RBC 3.00 (*)     Hemoglobin 8.4 (*)    HCT 26.3 (*)    All other components within normal limits  COMPREHENSIVE METABOLIC PANEL - Abnormal; Notable for the following components:   CO2 21 (*)    Glucose, Bld 59 (*)    BUN 27 (*)    Creatinine, Ser 2.93 (*)    Calcium 7.9 (*)    Total Protein 6.0 (*)    Albumin 2.4 (*)    Alkaline Phosphatase 134 (*)    GFR, Estimated 18 (*)    All other components within normal  limits  BRAIN NATRIURETIC PEPTIDE - Abnormal; Notable for the following components:   B Natriuretic Peptide 687.1 (*)    All other components within normal limits  CBG MONITORING, ED - Abnormal; Notable for the following components:   Glucose-Capillary 51 (*)    All other components within normal limits  CBG MONITORING, ED - Abnormal; Notable for the following components:   Glucose-Capillary 49 (*)    All other components within normal limits  CBG MONITORING, ED - Abnormal; Notable for the following components:   Glucose-Capillary 130 (*)    All other components within normal limits  CBG MONITORING, ED - Abnormal; Notable for the following components:   Glucose-Capillary 107 (*)    All other components within normal limits  CBG MONITORING, ED - Abnormal; Notable for the following components:   Glucose-Capillary 181 (*)    All other components within normal limits  CBG MONITORING, ED - Abnormal; Notable for the following components:   Glucose-Capillary 149 (*)    All other components within normal limits  URINALYSIS, ROUTINE W REFLEX MICROSCOPIC  BASIC METABOLIC PANEL  RAPID URINE DRUG SCREEN, HOSP PERFORMED  TROPONIN I (HIGH SENSITIVITY)  TROPONIN I (HIGH SENSITIVITY)    EKG EKG Interpretation  Date/Time:  Saturday March 10 2022 05:03:55 EDT Ventricular Rate:  68 PR Interval:  146 QRS Duration: 95 QT Interval:  453 QTC Calculation: 482 R Axis:     Text Interpretation: EASI Derived Leads lead placement doesn't look correct, significantly different morphology and axis from  previous ecg's will repeat Confirmed by Merrily Pew [phone]) on 03/10/2022 5:55:49 AM  Radiology CT Head Wo Contrast  Result Date: 03/10/2022 CLINICAL DATA:  Mental status change with unknown cause EXAM: CT HEAD WITHOUT CONTRAST TECHNIQUE: Contiguous axial images were obtained from the base of the skull through the vertex without intravenous contrast. RADIATION DOSE REDUCTION: This exam was performed according to the departmental dose-optimization program which includes automated exposure control, adjustment of the mA and/or kV according to patient size and/or use of iterative reconstruction technique. COMPARISON:  01/22/2022 FINDINGS: Brain: No evidence of acute infarction, hemorrhage, hydrocephalus, extra-axial collection or mass lesion/mass effect. 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 Chronic lacune at the anterior left putamen. Vascular: No hyperdense vessel or unexpected calcification. Skull: Normal. Negative for fracture or focal lesion. Sinuses/Orbits: No acute finding. IMPRESSION: Stable head CT.  No acute finding. Electronically Signed   By: Jorje Guild M.D.   On: 03/10/2022 05:04   DG Chest Portable 1 View  Result Date: 03/10/2022 CLINICAL DATA:  Weakness, hypoglycemia EXAM: PORTABLE CHEST 1 VIEW COMPARISON:  CT chest dated 01/22/2022 FINDINGS: Cardiomegaly with mild perihilar edema. No definite pleural effusions. No pneumothorax. IMPRESSION: Cardiomegaly with mild perihilar edema. Electronically Signed   By: Julian Hy M.D.   On: 03/10/2022 03:28    Procedures .Critical Care  Performed by: Merrily Pew, MD Authorized by: Merrily Pew, MD   Critical care provider statement:    Critical care time (minutes):  30   Critical care was necessary to treat or prevent imminent or life-threatening deterioration of the following conditions:  Endocrine crisis   Critical care was time spent personally by me on the following activities:  Development of treatment  plan with patient or surrogate, discussions with consultants, evaluation of patient's response to treatment, examination of patient, ordering and review of laboratory studies, ordering and review of radiographic studies, ordering and performing treatments and interventions, pulse oximetry, re-evaluation of patient's condition and review of  old charts     Medications Ordered in ED Medications  dextrose 10 % infusion (has no administration in time range)  heparin injection 5,000 Units (has no administration in time range)  acetaminophen (TYLENOL) tablet 650 mg (has no administration in time range)    Or  acetaminophen (TYLENOL) suppository 650 mg (has no administration in time range)  furosemide (LASIX) injection 40 mg (has no administration in time range)  hydrALAZINE (APRESOLINE) injection 5 mg (has no administration in time range)  dextrose 50 % solution (50 mLs  Given 03/10/22 0103)  dextrose 50 % solution 50 mL (50 mLs Intravenous Given 03/10/22 0309)    ED Course/ Medical Decision Making/ A&P                           Medical Decision Making Amount and/or Complexity of Data Reviewed Labs: ordered. Radiology: ordered. ECG/medicine tests: ordered.  Risk Prescription drug management. Decision regarding hospitalization.   Multiple episodes of recurrent hypoglycemia in the ER requiring 3 D50 pushes ultimately started on D10 infusion.  Kidney disease is a little bit improved from baseline but still pretty significant.  Head CT, troponins, EKG were all reassuring as not being likely causes for symptoms.  Discussed with hospitalist for admission.   Final Clinical Impression(s) / ED Diagnoses Final diagnoses:  Hypoglycemia    Rx / DC Orders ED Discharge Orders     None         Porshe Fleagle, Corene Cornea, MD 03/10/22 [phone]

## 2022-03-10 NOTE — Progress Notes (Signed)
PROGRESS NOTE  [name] ZOX:[id number] DOB: 1961-01-22 DOA: 03/10/2022 PCP: Simona Huh, NP  HPI/Recap of past 24 hours: Carol [name] is a 61 y.o. female with medical history significant of insulin-dependent type 2 diabetes, hypertension, cocaine use, CKD stage IIIb, CAD with recent NSTEMI in May 2023 managed medically, chronic diastolic CHF, hyperlipidemia, GERD, anxiety, depression, Parkinson's disease.  Admitted a month ago for volume overload and hypertensive urgency/emergency secondary to cocaine abuse and medication noncompliance. During this admission, patient was brought into the ED by EMS after she was found to be hypoglycemic with CBG 27.  Reportedly diaphoretic and less responsive at home and family called EMS.  She was given D50 after which her mental status improved.  Patient reports being sometimes confused about her medications.  Also noncompliant with diet and medications.  Patient also noted to have about 3+ bilateral lower extremity edema.  Patient admitted for further management.    Today, patient emotional about overall medical issues, frustrated about not remembering to take her medications and overall poor health with recurrent admission.  Educated and advised patient about the need to be compliant with diet and medications.   Assessment/Plan: Principal Problem:   Hypoglycemia Active Problems:   Acute on chronic diastolic CHF (congestive heart failure) (HCC)   Diabetes mellitus (HCC)   Pure hypercholesterolemia   CAD in native artery   Anxiety with depression   CKD (chronic kidney disease), stage IV (HCC)   Acute metabolic encephalopathy   Persistent hypoglycemia in the setting of type II diabetes On insulin and metformin, reports confusion with dosages, CBG in the 20s with EMS on admission A1c 6.4 on 12/30/2021 She was discharged a month ago on glimepiride 4 mg twice daily, Lantus 20 units daily, Humalog 10 units 3 times daily, and  metformin 500 mg twice daily.  It seems patient is taking a higher dose of insulin than what was prescribed (Lantus 60 units daily and Humalog 15 units 3 times daily) S/p IV dextrose, D50 bolus DC IV dextrose for now due to volume overload Continue to hold insulin and oral hypoglycemic agents CBG checks every 2 hours Due to improvement of her CBGs, will start SSI, Accu-Cheks, hypoglycemic protocol Monitor closely   Acute metabolic encephalopathy Resolved Likely 2/2 hypoglycemia   Acute on chronic diastolic CHF Echo done in May 2023 showing preserved EF and grade 1 diastolic dysfunction Chest x-ray showing mild perihilar edema BNP 687 Continue IV Lasix Strict I's and O's, daily weights  CKD stage IV Creatinine 2.9 on admission  Monitor BMP closely while diuresing  Anemia of chronic kidney disease Hemoglobin around baseline Daily CBC   Hypertension BP uncontrolled Restart home BP meds, hold beta-blocker due to history of cocaine abuse UDS pending  CAD Currently chest pain-free High-sensitivity troponin negative x2 and not consistent with ACS Continue home DAPT and statin   Hyperlipidemia Continue statin   Anxiety with depression Continue home meds   Obesity Lifestyle modification advised    Estimated body mass index is 37.12 kg/m as calculated from the following:   Height as of this encounter: 5\' 4"  (1.626 m).   Weight as of this encounter: 98.1 kg.     Code Status: Full  Family Communication: Discussed with partner at bedside  Disposition Plan: Status is: Observation The patient will require care spanning > 2 midnights and should be moved to inpatient because: Level of care      Consultants: None  Procedures: None  Antimicrobials: None  DVT prophylaxis:  Heparin [place]   Objective: Vitals:   03/10/22 1230 03/10/22 1300 03/10/22 1430 03/10/22 1530  BP: (!) 176/99 (!) 186/97 (!) 169/88 (!) 180/95  Pulse: 70 69 71 71  Resp: 17 (!) 21 (!) 23 (!)  27  Temp:      TempSrc:      SpO2: 98% 96% 97% 99%  Weight:      Height:       No intake or output data in the 24 hours ending 03/10/22 1653 Filed Weights   03/10/22 0101  Weight: 98.1 kg    Exam: General: NAD  Cardiovascular: S1, S2 present Respiratory: CTAB Abdomen: Soft, nontender, nondistended, bowel sounds present Musculoskeletal: 3+ bilateral pedal edema noted Skin: Normal Psychiatry: Fair mood     Data Reviewed: CBC: Recent Labs  Lab 03/10/22 0257  WBC 4.7  NEUTROABS 2.4  HGB 8.4*  HCT 26.3*  MCV 87.7  PLT 174   Basic Metabolic Panel: Recent Labs  Lab 03/10/22 0257  NA 135  K 3.7  CL 106  CO2 21*  GLUCOSE 59*  BUN 27*  CREATININE 2.93*  CALCIUM 7.9*   GFR: Estimated Creatinine Clearance: 23.5 mL/min (A) (by C-G formula based on SCr of 2.93 mg/dL (H)). Liver Function Tests: Recent Labs  Lab 03/10/22 0257  AST 36  ALT 16  ALKPHOS 134*  BILITOT 0.6  PROT 6.0*  ALBUMIN 2.4*   No results for input(s): "LIPASE", "AMYLASE" in the last 168 hours. No results for input(s): "AMMONIA" in the last 168 hours. Coagulation Profile: No results for input(s): "INR", "PROTIME" in the last 168 hours. Cardiac Enzymes: No results for input(s): "CKTOTAL", "CKMB", "CKMBINDEX", "TROPONINI" in the last 168 hours. BNP (last 3 results) No results for input(s): "PROBNP" in the last 8760 hours. HbA1C: No results for input(s): "HGBA1C" in the last 72 hours. CBG: Recent Labs  Lab 03/10/22 1030 03/10/22 1049 03/10/22 1251 03/10/22 1430 03/10/22 1539  GLUCAP 147* 140* 159* 197* 204*   Lipid Profile: No results for input(s): "CHOL", "HDL", "LDLCALC", "TRIG", "CHOLHDL", "LDLDIRECT" in the last 72 hours. Thyroid Function Tests: No results for input(s): "TSH", "T4TOTAL", "FREET4", "T3FREE", "THYROIDAB" in the last 72 hours. Anemia Panel: No results for input(s): "VITAMINB12", "FOLATE", "FERRITIN", "TIBC", "IRON", "RETICCTPCT" in the last 72 hours. Urine  analysis:    Component Value Date/Time   COLORURINE YELLOW 03/10/2022 [place] 03/10/2022 1038   LABSPEC 1.008 03/10/2022 1038   PHURINE 8.0 03/10/2022 1038   GLUCOSEU NEGATIVE 03/10/2022 1038   HGBUR NEGATIVE 03/10/2022 1038   BILIRUBINUR NEGATIVE 03/10/2022 1038   KETONESUR NEGATIVE 03/10/2022 1038   PROTEINUR 100 (A) 03/10/2022 1038   UROBILINOGEN 0.2 01/29/2014 2116   NITRITE NEGATIVE 03/10/2022 1038   LEUKOCYTESUR NEGATIVE 03/10/2022 1038   Sepsis Labs: @LABRCNTIP (procalcitonin:4,lacticidven:4)  )No results found for this or any previous visit (from the past 240 hour(s)).    Studies: CT Head Wo Contrast  Result Date: 03/10/2022 CLINICAL DATA:  Mental status change with unknown cause EXAM: CT HEAD WITHOUT CONTRAST TECHNIQUE: Contiguous axial images were obtained from the base of the skull through the vertex without intravenous contrast. RADIATION DOSE REDUCTION: This exam was performed according to the departmental dose-optimization program which includes automated exposure control, adjustment of the mA and/or kV according to patient size and/or use of iterative reconstruction technique. COMPARISON:  01/22/2022 FINDINGS: Brain: No evidence of acute infarction, hemorrhage, hydrocephalus, extra-axial collection or mass lesion/mass effect. 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 Chronic lacune at  the anterior left putamen. Vascular: No hyperdense vessel or unexpected calcification. Skull: Normal. Negative for fracture or focal lesion. Sinuses/Orbits: No acute finding. IMPRESSION: Stable head CT.  No acute finding. Electronically Signed   By: Jorje Guild M.D.   On: 03/10/2022 05:04   DG Chest Portable 1 View  Result Date: 03/10/2022 CLINICAL DATA:  Weakness, hypoglycemia EXAM: PORTABLE CHEST 1 VIEW COMPARISON:  CT chest dated 01/22/2022 FINDINGS: Cardiomegaly with mild perihilar edema. No definite pleural effusions. No pneumothorax. IMPRESSION:  Cardiomegaly with mild perihilar edema. Electronically Signed   By: Julian Hy M.D.   On: 03/10/2022 03:28    Scheduled Meds:  furosemide  40 mg Intravenous BID   heparin  5,000 Units Subcutaneous Q8H    Continuous Infusions:   LOS: 0 days     Alma Friendly, MD [facility]  If 7PM-7AM, please contact night-coverage [email or web address] 03/10/2022, 4:53 PM

## 2022-03-10 NOTE — Progress Notes (Signed)
Pt arrived to the floor via the stretcher , alert and oriented and ambulated to the room. Stand up weight obtained, CHG bath given, Cardiac monitoring initiated, CCMD notified, VSS. Patient oriented to staff and equipment, call bell within reach, bed at the lowest position. We'll continue to monitor

## 2022-03-11 DIAGNOSIS — I5033 Acute on chronic diastolic (congestive) heart failure: Secondary | ICD-10-CM

## 2022-03-11 DIAGNOSIS — N184 Chronic kidney disease, stage 4 (severe): Secondary | ICD-10-CM

## 2022-03-11 DIAGNOSIS — E162 Hypoglycemia, unspecified: Secondary | ICD-10-CM | POA: Diagnosis not present

## 2022-03-11 LAB — GLUCOSE, CAPILLARY
Glucose-Capillary: 109 mg/dL — ABNORMAL HIGH (ref 70–99)
Glucose-Capillary: 156 mg/dL — ABNORMAL HIGH (ref 70–99)
Glucose-Capillary: 271 mg/dL — ABNORMAL HIGH (ref 70–99)
Glucose-Capillary: 215 mg/dL — ABNORMAL HIGH (ref 70–99)

## 2022-03-11 LAB — CBC WITH DIFFERENTIAL/PLATELET
Abs Immature Granulocytes: 0.01 10*3/uL (ref 0.00–0.07)
Basophils Absolute: 0 10*3/uL (ref 0.0–0.1)
Basophils Relative: 1 %
Eosinophils Absolute: 0.3 10*3/uL (ref 0.0–0.5)
Eosinophils Relative: 6 %
HCT: 26.9 % — ABNORMAL LOW (ref 36.0–46.0)
Hemoglobin: 8.9 g/dL — ABNORMAL LOW (ref 12.0–15.0)
Immature Granulocytes: 0 %
Lymphocytes Relative: 32 %
Lymphs Abs: 1.7 10*3/uL (ref 0.7–4.0)
MCH: 28.2 pg (ref 26.0–34.0)
MCHC: 33.1 g/dL (ref 30.0–36.0)
MCV: 85.1 fL (ref 80.0–100.0)
Monocytes Absolute: 0.9 10*3/uL (ref 0.1–1.0)
Monocytes Relative: 18 %
Neutro Abs: 2.3 10*3/uL (ref 1.7–7.7)
Neutrophils Relative %: 43 %
Platelets: 230 10*3/uL (ref 150–400)
RBC: 3.16 MIL/uL — ABNORMAL LOW (ref 3.87–5.11)
RDW: 14.2 % (ref 11.5–15.5)
WBC: 5.3 10*3/uL (ref 4.0–10.5)
nRBC: 0 % (ref 0.0–0.2)

## 2022-03-11 LAB — BASIC METABOLIC PANEL
Anion gap: 8 (ref 5–15)
BUN: 23 mg/dL — ABNORMAL HIGH (ref 6–20)
CO2: 23 mmol/L (ref 22–32)
Calcium: 8.3 mg/dL — ABNORMAL LOW (ref 8.9–10.3)
Chloride: 106 mmol/L (ref 98–111)
Creatinine, Ser: 2.4 mg/dL — ABNORMAL HIGH (ref 0.44–1.00)
GFR, Estimated: 23 mL/min — ABNORMAL LOW (ref >60)
Glucose, Bld: 146 mg/dL — ABNORMAL HIGH (ref 70–99)
Potassium: 4 mmol/L (ref 3.5–5.1)
Sodium: 137 mmol/L (ref 135–145)

## 2022-03-11 NOTE — Care Management Obs Status (Signed)
[facility] NOTIFICATION   Patient Details  Name: Tarisha Fader MRN: [id number] Date of Birth: 12/02/1960   Medicare Observation Status Notification Given:  Yes    Isaiyah Feldhaus G., RN 03/11/2022, 8:55 AM

## 2022-03-11 NOTE — Progress Notes (Signed)
PROGRESS NOTE  Carol [name] CZY:[id number] DOB: 1960-12-24 DOA: 03/10/2022 PCP: Simona Huh, NP  HPI/Recap of past 24 hours: Carol [name] is a 61 y.o. female with medical history significant of insulin-dependent type 2 diabetes, hypertension, cocaine use, CKD stage IIIb, CAD with recent NSTEMI in May 2023 managed medically, chronic diastolic CHF, hyperlipidemia, GERD, anxiety, depression, Parkinson's disease.  Admitted a month ago for volume overload and hypertensive urgency/emergency secondary to cocaine abuse and medication noncompliance. During this admission, patient was brought into the ED by EMS after she was found to be hypoglycemic with CBG 27.  Reportedly diaphoretic and less responsive at home and family called EMS.  She was given D50 after which her mental status improved.  Patient reports being sometimes confused about her medications.  Also noncompliant with diet and medications.  Patient also noted to have about 3+ bilateral lower extremity edema.  Patient admitted for further management.    Today, patient denies any new complaints, noted to be putting extra sugar on fruits and drinking regular soda.  Patient advised to eat a carb modified diet since her sugars have improved significantly off IV fluids.  Still with significant BLE edema.   Assessment/Plan: Principal Problem:   Hypoglycemia Active Problems:   Acute on chronic diastolic CHF (congestive heart failure) (HCC)   Diabetes mellitus (HCC)   Pure hypercholesterolemia   CAD in native artery   Anxiety with depression   CKD (chronic kidney disease), stage IV (HCC)   Acute metabolic encephalopathy   Persistent hypoglycemia in the setting of type II diabetes On insulin and metformin, reports confusion with dosages, CBG in the 20s with EMS on admission A1c 6.4 on 12/30/2021 She was discharged a month ago on glimepiride 4 mg twice daily, Lantus 20 units daily, Humalog 10 units 3 times daily, and  metformin 500 mg twice daily.  It seems patient is taking a higher dose of insulin than what was prescribed (Lantus 60 units daily and Humalog 15 units 3 times daily) S/p IV dextrose, D50 bolus DC IV dextrose for now due to volume overload Due to improvement of her CBGs, will start SSI, Accu-Cheks, hypoglycemic protocol Monitor closely   Acute metabolic encephalopathy Resolved Likely 2/2 hypoglycemia   Acute on chronic diastolic CHF Echo done in May 2023 showing preserved EF and grade 1 diastolic dysfunction Chest x-ray showing mild perihilar edema BNP 687 Continue IV Lasix Strict I's and O's, daily weights  CKD stage IV Creatinine 2.9 on admission  Monitor BMP closely while diuresing  Anemia of chronic kidney disease Hemoglobin around baseline Daily CBC   Hypertension BP stable Restart home BP meds, hold beta-blocker due to history of cocaine abuse UDS pending collection  CAD Currently chest pain-free High-sensitivity troponin negative x2 and not consistent with ACS Continue home DAPT and statin   Hyperlipidemia Continue statin   Anxiety with depression Continue home meds   Obesity Lifestyle modification advised    Estimated body mass index is 36.97 kg/m as calculated from the following:   Height as of this encounter: 5\' 4"  (1.626 m).   Weight as of this encounter: 97.7 kg.     Code Status: Full  Family Communication: Discussed with partner at bedside  Disposition Plan: Status is: Observation The patient will require care spanning > 2 midnights and should be moved to inpatient because: Level of care      Consultants: None  Procedures: None  Antimicrobials: None  DVT prophylaxis: Heparin [place]   Objective: Vitals:  03/11/22 0500 03/11/22 0906 03/11/22 1115 03/11/22 1553  BP:  (!) 147/83 (!) 116/58 138/68  Pulse:  64 66 67  Resp:  19 19 19   Temp:  98.6 F (37 C) 98.1 F (36.7 C) 98.8 F (37.1 C)  TempSrc:  Oral Oral Oral  SpO2:  98%  98% 98%  Weight: 97.7 kg     Height:        Intake/Output Summary (Last 24 hours) at 03/11/2022 1703 Last data filed at 03/11/2022 1553 Gross per 24 hour  Intake 240 ml  Output 500 ml  Net -260 ml   Filed Weights   03/10/22 0101 03/10/22 2018 03/11/22 0500  Weight: 98.1 kg 97.7 kg 97.7 kg    Exam: General: NAD  Cardiovascular: S1, S2 present Respiratory: CTAB Abdomen: Soft, nontender, nondistended, bowel sounds present Musculoskeletal: 3+ bilateral pedal edema noted Skin: Normal Psychiatry: Normal mood     Data Reviewed: CBC: Recent Labs  Lab 03/10/22 0257 03/11/22 0108  WBC 4.7 5.3  NEUTROABS 2.4 2.3  HGB 8.4* 8.9*  HCT 26.3* 26.9*  MCV 87.7 85.1  PLT 220 284   Basic Metabolic Panel: Recent Labs  Lab 03/10/22 0257 03/11/22 0108  NA 135 137  K 3.7 4.0  CL 106 106  CO2 21* 23  GLUCOSE 59* 146*  BUN 27* 23*  CREATININE 2.93* 2.40*  CALCIUM 7.9* 8.3*   GFR: Estimated Creatinine Clearance: 28.6 mL/min (A) (by C-G formula based on SCr of 2.4 mg/dL (H)). Liver Function Tests: Recent Labs  Lab 03/10/22 0257  AST 36  ALT 16  ALKPHOS 134*  BILITOT 0.6  PROT 6.0*  ALBUMIN 2.4*   No results for input(s): "LIPASE", "AMYLASE" in the last 168 hours. No results for input(s): "AMMONIA" in the last 168 hours. Coagulation Profile: No results for input(s): "INR", "PROTIME" in the last 168 hours. Cardiac Enzymes: No results for input(s): "CKTOTAL", "CKMB", "CKMBINDEX", "TROPONINI" in the last 168 hours. BNP (last 3 results) No results for input(s): "PROBNP" in the last 8760 hours. HbA1C: No results for input(s): "HGBA1C" in the last 72 hours. CBG: Recent Labs  Lab 03/10/22 1759 03/10/22 2036 03/11/22 0606 03/11/22 1113 03/11/22 1550  GLUCAP 170* 133* 109* 156* 271*   Lipid Profile: No results for input(s): "CHOL", "HDL", "LDLCALC", "TRIG", "CHOLHDL", "LDLDIRECT" in the last 72 hours. Thyroid Function Tests: No results for input(s): "TSH",  "T4TOTAL", "FREET4", "T3FREE", "THYROIDAB" in the last 72 hours. Anemia Panel: No results for input(s): "VITAMINB12", "FOLATE", "FERRITIN", "TIBC", "IRON", "RETICCTPCT" in the last 72 hours. Urine analysis:    Component Value Date/Time   COLORURINE YELLOW 03/10/2022 [place] 03/10/2022 1038   LABSPEC 1.008 03/10/2022 1038   PHURINE 8.0 03/10/2022 1038   GLUCOSEU NEGATIVE 03/10/2022 1038   HGBUR NEGATIVE 03/10/2022 1038   BILIRUBINUR NEGATIVE 03/10/2022 1038   KETONESUR NEGATIVE 03/10/2022 1038   PROTEINUR 100 (A) 03/10/2022 1038   UROBILINOGEN 0.2 01/29/2014 2116   NITRITE NEGATIVE 03/10/2022 1038   LEUKOCYTESUR NEGATIVE 03/10/2022 1038   Sepsis Labs: @LABRCNTIP (procalcitonin:4,lacticidven:4)  )No results found for this or any previous visit (from the past 240 hour(s)).    Studies: No results found.  Scheduled Meds:  amLODipine  10 mg Oral Daily   aspirin EC  81 mg Oral Daily   atorvastatin  80 mg Oral Daily   buprenorphine-naloxone  0.5 tablet Sublingual Daily   clopidogrel  75 mg Oral Daily   fluticasone furoate-vilanterol  1 puff Inhalation Daily   furosemide  40  mg Intravenous BID   heparin  5,000 Units Subcutaneous Q8H   hydrALAZINE  25 mg Oral TID   insulin aspart  0-5 Units Subcutaneous QHS   insulin aspart  0-9 Units Subcutaneous TID WC   loratadine  10 mg Oral Daily   pantoprazole  40 mg Oral Q1200   QUEtiapine  300 mg Oral QHS   sertraline  50 mg Oral QPM    Continuous Infusions:   LOS: 0 days     Alma Friendly, MD [facility]  If 7PM-7AM, please contact night-coverage [email or web address] 03/11/2022, 5:03 PM

## 2022-03-12 DIAGNOSIS — N184 Chronic kidney disease, stage 4 (severe): Secondary | ICD-10-CM | POA: Diagnosis not present

## 2022-03-12 DIAGNOSIS — I5033 Acute on chronic diastolic (congestive) heart failure: Secondary | ICD-10-CM | POA: Diagnosis not present

## 2022-03-12 DIAGNOSIS — E162 Hypoglycemia, unspecified: Secondary | ICD-10-CM | POA: Diagnosis not present

## 2022-03-12 LAB — BASIC METABOLIC PANEL
Anion gap: 6 (ref 5–15)
BUN: 27 mg/dL — ABNORMAL HIGH (ref 6–20)
CO2: 24 mmol/L (ref 22–32)
Calcium: 8.4 mg/dL — ABNORMAL LOW (ref 8.9–10.3)
Chloride: 105 mmol/L (ref 98–111)
Creatinine, Ser: 2.5 mg/dL — ABNORMAL HIGH (ref 0.44–1.00)
GFR, Estimated: 22 mL/min — ABNORMAL LOW (ref >60)
Glucose, Bld: 170 mg/dL — ABNORMAL HIGH (ref 70–99)
Potassium: 4 mmol/L (ref 3.5–5.1)
Sodium: 135 mmol/L (ref 135–145)

## 2022-03-12 LAB — CBC WITH DIFFERENTIAL/PLATELET
Abs Immature Granulocytes: 0.02 10*3/uL (ref 0.00–0.07)
Basophils Absolute: 0.1 10*3/uL (ref 0.0–0.1)
Basophils Relative: 1 %
Eosinophils Absolute: 0.3 10*3/uL (ref 0.0–0.5)
Eosinophils Relative: 8 %
HCT: 27.7 % — ABNORMAL LOW (ref 36.0–46.0)
Hemoglobin: 9.4 g/dL — ABNORMAL LOW (ref 12.0–15.0)
Immature Granulocytes: 1 %
Lymphocytes Relative: 30 %
Lymphs Abs: 1.3 10*3/uL (ref 0.7–4.0)
MCH: 28.6 pg (ref 26.0–34.0)
MCHC: 33.9 g/dL (ref 30.0–36.0)
MCV: 84.2 fL (ref 80.0–100.0)
Monocytes Absolute: 0.9 10*3/uL (ref 0.1–1.0)
Monocytes Relative: 20 %
Neutro Abs: 1.8 10*3/uL (ref 1.7–7.7)
Neutrophils Relative %: 40 %
Platelets: 257 10*3/uL (ref 150–400)
RBC: 3.29 MIL/uL — ABNORMAL LOW (ref 3.87–5.11)
RDW: 14 % (ref 11.5–15.5)
WBC: 4.4 10*3/uL (ref 4.0–10.5)
nRBC: 0 % (ref 0.0–0.2)

## 2022-03-12 LAB — GLUCOSE, CAPILLARY
Glucose-Capillary: 133 mg/dL — ABNORMAL HIGH (ref 70–99)
Glucose-Capillary: 180 mg/dL — ABNORMAL HIGH (ref 70–99)
Glucose-Capillary: 190 mg/dL — ABNORMAL HIGH (ref 70–99)
Glucose-Capillary: 145 mg/dL — ABNORMAL HIGH (ref 70–99)

## 2022-03-12 MED ORDER — INSULIN GLARGINE-YFGN 100 UNIT/ML ~~LOC~~ SOLN
10.0000 [IU] | Freq: Every day | SUBCUTANEOUS | Status: DC
  Administered 2022-03-12 – 2022-03-17 (×6): 10 [IU] via SUBCUTANEOUS
  Filled 2022-03-12 (×6): qty 0.1

## 2022-03-12 NOTE — Progress Notes (Signed)
PROGRESS NOTE  Elease Swarm VPX:[id number] DOB: 23-Apr-1961 DOA: 03/10/2022 PCP: Simona Huh, NP  HPI/Recap of past 24 hours: Carol [name] is a 61 y.o. female with medical history significant of insulin-dependent type 2 diabetes, hypertension, cocaine use, CKD stage IIIb, CAD with recent NSTEMI in May 2023 managed medically, chronic diastolic CHF, hyperlipidemia, GERD, anxiety, depression, Parkinson's disease.  Admitted a month ago for volume overload and hypertensive urgency/emergency secondary to cocaine abuse and medication noncompliance. During this admission, patient was brought into the ED by EMS after she was found to be hypoglycemic with CBG 27.  Reportedly diaphoretic and less responsive at home and family called EMS.  She was given D50 after which her mental status improved.  Patient reports being sometimes confused about her medications.  Also noncompliant with diet and medications.  Patient also noted to have about 3+ bilateral lower extremity edema.  Patient admitted for further management.    Today, patient denies any new complaints.  Still with significant BLE edema.  Ongoing diuresis   Assessment/Plan: Principal Problem:   Hypoglycemia Active Problems:   Acute on chronic diastolic CHF (congestive heart failure) (HCC)   Diabetes mellitus (HCC)   Pure hypercholesterolemia   CAD in native artery   Anxiety with depression   CKD (chronic kidney disease), stage IV (HCC)   Acute metabolic encephalopathy   Persistent hypoglycemia in the setting of type II diabetes Resolved On insulin and metformin, reports confusion with dosages, CBG in the 20s with EMS on admission A1c 6.4 on 12/30/2021 She was discharged a month ago on glimepiride 4 mg twice daily, Lantus 20 units daily, Humalog 10 units 3 times daily, and metformin 500 mg twice daily.  It seems patient is taking a higher dose of insulin than what was prescribed (Lantus 60 units daily and Humalog 15  units 3 times daily) S/p IV dextrose, D50 bolus DC IV dextrose for now due to volume overload Due to improvement of her CBGs, start SSI, semglee, Accu-Cheks, hypoglycemic protocol Monitor closely   Acute metabolic encephalopathy Resolved Likely 2/2 hypoglycemia   Acute on chronic diastolic CHF Echo done in May 2023 showing preserved EF and grade 1 diastolic dysfunction Chest x-ray showing mild perihilar edema BNP 687 Continue IV Lasix Strict I's and O's, daily weights  CKD stage IV Creatinine 2.9 on admission  Monitor BMP closely while diuresing  Anemia of chronic kidney disease Hemoglobin around baseline Daily CBC   Hypertension BP stable Restart home BP meds, hold beta-blocker due to history of cocaine abuse UDS pending collection  CAD Currently chest pain-free High-sensitivity troponin negative x2 and not consistent with ACS Continue home DAPT and statin   Hyperlipidemia Continue statin   Anxiety with depression Continue home meds   Obesity Lifestyle modification advised    Estimated body mass index is 36.9 kg/m as calculated from the following:   Height as of this encounter: 5\' 4"  (1.626 m).   Weight as of this encounter: 97.5 kg.     Code Status: Full  Family Communication: None at bedside  Disposition Plan: Status is: Observation The patient will require care spanning > 2 midnights and should be moved to inpatient because: Level of care      Consultants: None  Procedures: None  Antimicrobials: None  DVT prophylaxis: Heparin [place]   Objective: Vitals:   03/12/22 0412 03/12/22 0918 03/12/22 1129 03/12/22 1212  BP: (!) 116/58 (!) 162/82  132/60  Pulse: 65 68  78  Resp: 14 16  17  Temp:  (!) 96.3 F (35.7 C)  98.2 F (36.8 C)  TempSrc:  Axillary  Oral  SpO2: 96% 100% 100% 95%  Weight: 97.5 kg     Height:        Intake/Output Summary (Last 24 hours) at 03/12/2022 1635 Last data filed at 03/12/2022 1213 Gross per 24 hour  Intake  --  Output 1250 ml  Net -1250 ml   Filed Weights   03/10/22 2018 03/11/22 0500 03/12/22 0412  Weight: 97.7 kg 97.7 kg 97.5 kg    Exam: General: NAD  Cardiovascular: S1, S2 present Respiratory: CTAB Abdomen: Soft, nontender, nondistended, bowel sounds present Musculoskeletal: 3+ bilateral pedal edema noted Skin: Normal Psychiatry: Normal mood     Data Reviewed: CBC: Recent Labs  Lab 03/10/22 0257 03/11/22 0108 03/12/22 0512  WBC 4.7 5.3 4.4  NEUTROABS 2.4 2.3 1.8  HGB 8.4* 8.9* 9.4*  HCT 26.3* 26.9* 27.7*  MCV 87.7 85.1 84.2  PLT 220 230 742   Basic Metabolic Panel: Recent Labs  Lab 03/10/22 0257 03/11/22 0108 03/12/22 0512  NA 135 137 135  K 3.7 4.0 4.0  CL 106 106 105  CO2 21* 23 24  GLUCOSE 59* 146* 170*  BUN 27* 23* 27*  CREATININE 2.93* 2.40* 2.50*  CALCIUM 7.9* 8.3* 8.4*   GFR: Estimated Creatinine Clearance: 27.5 mL/min (A) (by C-G formula based on SCr of 2.5 mg/dL (H)). Liver Function Tests: Recent Labs  Lab 03/10/22 0257  AST 36  ALT 16  ALKPHOS 134*  BILITOT 0.6  PROT 6.0*  ALBUMIN 2.4*   No results for input(s): "LIPASE", "AMYLASE" in the last 168 hours. No results for input(s): "AMMONIA" in the last 168 hours. Coagulation Profile: No results for input(s): "INR", "PROTIME" in the last 168 hours. Cardiac Enzymes: No results for input(s): "CKTOTAL", "CKMB", "CKMBINDEX", "TROPONINI" in the last 168 hours. BNP (last 3 results) No results for input(s): "PROBNP" in the last 8760 hours. HbA1C: No results for input(s): "HGBA1C" in the last 72 hours. CBG: Recent Labs  Lab 03/11/22 1550 03/11/22 2144 03/12/22 0640 03/12/22 1128 03/12/22 1604  GLUCAP 271* 215* 133* 180* 190*   Lipid Profile: No results for input(s): "CHOL", "HDL", "LDLCALC", "TRIG", "CHOLHDL", "LDLDIRECT" in the last 72 hours. Thyroid Function Tests: No results for input(s): "TSH", "T4TOTAL", "FREET4", "T3FREE", "THYROIDAB" in the last 72 hours. Anemia Panel: No  results for input(s): "VITAMINB12", "FOLATE", "FERRITIN", "TIBC", "IRON", "RETICCTPCT" in the last 72 hours. Urine analysis:    Component Value Date/Time   COLORURINE YELLOW 03/10/2022 [place] 03/10/2022 1038   LABSPEC 1.008 03/10/2022 1038   PHURINE 8.0 03/10/2022 1038   GLUCOSEU NEGATIVE 03/10/2022 1038   HGBUR NEGATIVE 03/10/2022 1038   BILIRUBINUR NEGATIVE 03/10/2022 1038   KETONESUR NEGATIVE 03/10/2022 1038   PROTEINUR 100 (A) 03/10/2022 1038   UROBILINOGEN 0.2 01/29/2014 2116   NITRITE NEGATIVE 03/10/2022 1038   LEUKOCYTESUR NEGATIVE 03/10/2022 1038   Sepsis Labs: @LABRCNTIP (procalcitonin:4,lacticidven:4)  )No results found for this or any previous visit (from the past 240 hour(s)).    Studies: No results found.  Scheduled Meds:  amLODipine  10 mg Oral Daily   aspirin EC  81 mg Oral Daily   atorvastatin  80 mg Oral Daily   buprenorphine-naloxone  0.5 tablet Sublingual Daily   clopidogrel  75 mg Oral Daily   fluticasone furoate-vilanterol  1 puff Inhalation Daily   furosemide  40 mg Intravenous BID   heparin  5,000 Units Subcutaneous Q8H  hydrALAZINE  25 mg Oral TID   insulin aspart  0-5 Units Subcutaneous QHS   insulin aspart  0-9 Units Subcutaneous TID WC   insulin glargine-yfgn  10 Units Subcutaneous Daily   loratadine  10 mg Oral Daily   pantoprazole  40 mg Oral Q1200   QUEtiapine  300 mg Oral QHS   sertraline  50 mg Oral QPM    Continuous Infusions:   LOS: 0 days     Alma Friendly, MD [facility]  If 7PM-7AM, please contact night-coverage [email or web address] 03/12/2022, 4:35 PM

## 2022-03-12 NOTE — Progress Notes (Signed)
Heart Failure Navigator Progress Note  Assessed for Heart & Vascular TOC clinic readiness.  Patient does not meet criteria due to EF 60-65% , .   Navigator available for reassessment of patient.   Earnestine Leys, BSN, Clinical cytogeneticist Only

## 2022-03-12 NOTE — Progress Notes (Signed)
Mobility Specialist - Progress Note   03/12/22 1500  Mobility  Activity Ambulated with assistance in hallway  Level of Assistance Contact guard assist, steadying assist  Assistive Device Front wheel walker  Distance Ambulated (ft) 280 ft  Activity Response Tolerated well  $Mobility charge 1 Mobility   Pre-mobility: 77 HR, 150/73 (96) BP, 100% SpO2 During mobility: 85 HR Post-mobility: 80 HR, 145/72 (86) BP, 98% SPO2  Pt was received in bed and agreeable to mobility. C/o mild dizziness upon sitting EOB as well as during mobility, otherwise asymptomatic. Pt returned to bed with all needs met and call bell in reach.     Mobility Specialist   

## 2022-03-13 DIAGNOSIS — I5033 Acute on chronic diastolic (congestive) heart failure: Secondary | ICD-10-CM | POA: Diagnosis not present

## 2022-03-13 DIAGNOSIS — N184 Chronic kidney disease, stage 4 (severe): Secondary | ICD-10-CM | POA: Diagnosis not present

## 2022-03-13 DIAGNOSIS — E162 Hypoglycemia, unspecified: Secondary | ICD-10-CM | POA: Diagnosis not present

## 2022-03-13 LAB — CBC WITH DIFFERENTIAL/PLATELET
Abs Immature Granulocytes: 0.02 10*3/uL (ref 0.00–0.07)
Basophils Absolute: 0 10*3/uL (ref 0.0–0.1)
Basophils Relative: 1 %
Eosinophils Absolute: 0.4 10*3/uL (ref 0.0–0.5)
Eosinophils Relative: 7 %
HCT: 26.3 % — ABNORMAL LOW (ref 36.0–46.0)
Hemoglobin: 8.7 g/dL — ABNORMAL LOW (ref 12.0–15.0)
Immature Granulocytes: 0 %
Lymphocytes Relative: 27 %
Lymphs Abs: 1.3 10*3/uL (ref 0.7–4.0)
MCH: 28.2 pg (ref 26.0–34.0)
MCHC: 33.1 g/dL (ref 30.0–36.0)
MCV: 85.1 fL (ref 80.0–100.0)
Monocytes Absolute: 0.9 10*3/uL (ref 0.1–1.0)
Monocytes Relative: 19 %
Neutro Abs: 2.3 10*3/uL (ref 1.7–7.7)
Neutrophils Relative %: 46 %
Platelets: 241 10*3/uL (ref 150–400)
RBC: 3.09 MIL/uL — ABNORMAL LOW (ref 3.87–5.11)
RDW: 14.2 % (ref 11.5–15.5)
WBC: 4.9 10*3/uL (ref 4.0–10.5)
nRBC: 0 % (ref 0.0–0.2)

## 2022-03-13 LAB — GLUCOSE, CAPILLARY
Glucose-Capillary: 126 mg/dL — ABNORMAL HIGH (ref 70–99)
Glucose-Capillary: 158 mg/dL — ABNORMAL HIGH (ref 70–99)
Glucose-Capillary: 115 mg/dL — ABNORMAL HIGH (ref 70–99)
Glucose-Capillary: 188 mg/dL — ABNORMAL HIGH (ref 70–99)

## 2022-03-13 LAB — BASIC METABOLIC PANEL
Anion gap: 7 (ref 5–15)
BUN: 30 mg/dL — ABNORMAL HIGH (ref 6–20)
CO2: 24 mmol/L (ref 22–32)
Calcium: 8.3 mg/dL — ABNORMAL LOW (ref 8.9–10.3)
Chloride: 103 mmol/L (ref 98–111)
Creatinine, Ser: 2.63 mg/dL — ABNORMAL HIGH (ref 0.44–1.00)
GFR, Estimated: 20 mL/min — ABNORMAL LOW (ref >60)
Glucose, Bld: 196 mg/dL — ABNORMAL HIGH (ref 70–99)
Potassium: 4.1 mmol/L (ref 3.5–5.1)
Sodium: 134 mmol/L — ABNORMAL LOW (ref 135–145)

## 2022-03-13 MED ORDER — IPRATROPIUM-ALBUTEROL 0.5-2.5 (3) MG/3ML IN SOLN
3.0000 mL | Freq: Four times a day (QID) | RESPIRATORY_TRACT | Status: DC | PRN
Start: 2022-03-13 — End: 2022-03-17
  Administered 2022-03-17: 3 mL via RESPIRATORY_TRACT
  Filled 2022-03-13: qty 3

## 2022-03-13 NOTE — Progress Notes (Signed)
Mobility Specialist Progress Note    03/13/22 1535  Mobility  Activity Ambulated with assistance in hallway  Level of Assistance Contact guard assist, steadying assist  Assistive Device Front wheel walker  Distance Ambulated (ft) 470 ft  Activity Response Tolerated well  $Mobility charge 1 Mobility   Pre-Mobility: 95 HR, 95% SpO2 During Mobility: 93% SpO2 Post-Mobility: 76 HR, 100% SpO2  Pt received in bed and agreeable. No complaints. Maintained SpO2 >/=90% on RA. Returned to bed with call bell in reach and remained on RA. RN aware.   Hildred Alamin Mobility Specialist

## 2022-03-13 NOTE — Progress Notes (Signed)
PROGRESS NOTE  Carol [name] [id number]:[id number] DOB: 10-Feb-1961 DOA: 03/10/2022 PCP: Simona Huh, NP  HPI/Recap of past 24 hours: Carol [name] is a 61 y.o. female with medical history significant of insulin-dependent type 2 diabetes, hypertension, cocaine use, CKD stage IIIb, CAD with recent NSTEMI in May 2023 managed medically, chronic diastolic CHF, hyperlipidemia, GERD, anxiety, depression, Parkinson's disease.  Admitted a month ago for volume overload and hypertensive urgency/emergency secondary to cocaine abuse and medication noncompliance. During this admission, patient was brought into the ED by EMS after she was found to be hypoglycemic with CBG 27.  Reportedly diaphoretic and less responsive at home and family called EMS.  She was given D50 after which her mental status improved.  Patient reports being sometimes confused about her medications.  Also noncompliant with diet and medications.  Patient also noted to have about 3+ bilateral lower extremity edema.  Patient admitted for further management.    Today, pt denies any new complaints. Still with significant BLE edema.   Assessment/Plan: Principal Problem:   Hypoglycemia Active Problems:   Acute on chronic diastolic CHF (congestive heart failure) (HCC)   Diabetes mellitus (HCC)   Pure hypercholesterolemia   CAD in native artery   Anxiety with depression   CKD (chronic kidney disease), stage IV (HCC)   Acute metabolic encephalopathy   Persistent hypoglycemia in the setting of type II diabetes Resolved On insulin and metformin, reports confusion with dosages, CBG in the 20s with EMS on admission A1c 6.4 on 12/30/2021 She was discharged a month ago on glimepiride 4 mg twice daily, Lantus 20 units daily, Humalog 10 units 3 times daily, and metformin 500 mg twice daily.  It seems patient is taking a higher dose of insulin than what was prescribed (Lantus 60 units daily and Humalog 15 units 3 times daily) S/p IV  dextrose, D50 bolus DC IV dextrose for now due to volume overload Due to improvement of her CBGs, start SSI, semglee, Accu-Cheks, hypoglycemic protocol Monitor closely   Acute metabolic encephalopathy Resolved Likely 2/2 hypoglycemia   Acute on chronic diastolic CHF 3+ BLE edema Echo done in May 2023 showing preserved EF and grade 1 diastolic dysfunction Chest x-ray showing mild perihilar edema BNP 687 Continue IV Lasix Strict I's and O's, daily weights  CKD stage IV Creatinine 2.9 on admission  Monitor BMP closely while diuresing (may involve nephrology if Cr spikes)  Anemia of chronic kidney disease Hemoglobin around baseline Daily CBC   Hypertension BP stable Restart home BP meds, hold beta-blocker for now (history of cocaine abuse) UDS pending collection  CAD Currently chest pain-free High-sensitivity troponin negative x2 and not consistent with ACS Continue home DAPT and statin   Hyperlipidemia Continue statin   Anxiety with depression Continue home meds   Obesity Lifestyle modification advised    Estimated body mass index is 36.9 kg/m as calculated from the following:   Height as of this encounter: 5\' 4"  (1.626 m).   Weight as of this encounter: 97.5 kg.     Code Status: Full  Family Communication: None at bedside  Disposition Plan: Status is: Observation The patient will require care spanning > 2 midnights and should be moved to inpatient because: Level of care      Consultants: None  Procedures: None  Antimicrobials: None  DVT prophylaxis: Heparin [place]   Objective: Vitals:   03/13/22 0807 03/13/22 0928 03/13/22 0930 03/13/22 1335  BP: 122/63   (!) 131/56  Pulse:  80  74  Resp:  17  17  Temp:    98.5 F (36.9 C)  TempSrc:    Oral  SpO2:  99% 98% 96%  Weight:      Height:        Intake/Output Summary (Last 24 hours) at 03/13/2022 1714 Last data filed at 03/13/2022 1333 Gross per 24 hour  Intake 240 ml  Output 725 ml  Net  -485 ml   Filed Weights   03/10/22 2018 03/11/22 0500 03/12/22 0412  Weight: 97.7 kg 97.7 kg 97.5 kg    Exam: General: NAD  Cardiovascular: S1, S2 present Respiratory: CTAB Abdomen: Soft, nontender, nondistended, bowel sounds present Musculoskeletal: 3+ bilateral pedal edema noted Skin: Normal Psychiatry: Normal mood     Data Reviewed: CBC: Recent Labs  Lab 03/10/22 0257 03/11/22 0108 03/12/22 0512 03/13/22 0143  WBC 4.7 5.3 4.4 4.9  NEUTROABS 2.4 2.3 1.8 2.3  HGB 8.4* 8.9* 9.4* 8.7*  HCT 26.3* 26.9* 27.7* 26.3*  MCV 87.7 85.1 84.2 85.1  PLT 220 230 257 269   Basic Metabolic Panel: Recent Labs  Lab 03/10/22 0257 03/11/22 0108 03/12/22 0512 03/13/22 0143  NA 135 137 135 134*  K 3.7 4.0 4.0 4.1  CL 106 106 105 103  CO2 21* 23 24 24   GLUCOSE 59* 146* 170* 196*  BUN 27* 23* 27* 30*  CREATININE 2.93* 2.40* 2.50* 2.63*  CALCIUM 7.9* 8.3* 8.4* 8.3*   GFR: Estimated Creatinine Clearance: 26.1 mL/min (A) (by C-G formula based on SCr of 2.63 mg/dL (H)). Liver Function Tests: Recent Labs  Lab 03/10/22 0257  AST 36  ALT 16  ALKPHOS 134*  BILITOT 0.6  PROT 6.0*  ALBUMIN 2.4*   No results for input(s): "LIPASE", "AMYLASE" in the last 168 hours. No results for input(s): "AMMONIA" in the last 168 hours. Coagulation Profile: No results for input(s): "INR", "PROTIME" in the last 168 hours. Cardiac Enzymes: No results for input(s): "CKTOTAL", "CKMB", "CKMBINDEX", "TROPONINI" in the last 168 hours. BNP (last 3 results) No results for input(s): "PROBNP" in the last 8760 hours. HbA1C: No results for input(s): "HGBA1C" in the last 72 hours. CBG: Recent Labs  Lab 03/12/22 1128 03/12/22 1604 03/12/22 2108 03/13/22 0634 03/13/22 1114  GLUCAP 180* 190* 145* 126* 158*   Lipid Profile: No results for input(s): "CHOL", "HDL", "LDLCALC", "TRIG", "CHOLHDL", "LDLDIRECT" in the last 72 hours. Thyroid Function Tests: No results for input(s): "TSH", "T4TOTAL",  "FREET4", "T3FREE", "THYROIDAB" in the last 72 hours. Anemia Panel: No results for input(s): "VITAMINB12", "FOLATE", "FERRITIN", "TIBC", "IRON", "RETICCTPCT" in the last 72 hours. Urine analysis:    Component Value Date/Time   COLORURINE YELLOW 03/10/2022 [place] 03/10/2022 1038   LABSPEC 1.008 03/10/2022 1038   PHURINE 8.0 03/10/2022 1038   GLUCOSEU NEGATIVE 03/10/2022 1038   HGBUR NEGATIVE 03/10/2022 1038   BILIRUBINUR NEGATIVE 03/10/2022 1038   KETONESUR NEGATIVE 03/10/2022 1038   PROTEINUR 100 (A) 03/10/2022 1038   UROBILINOGEN 0.2 01/29/2014 2116   NITRITE NEGATIVE 03/10/2022 1038   LEUKOCYTESUR NEGATIVE 03/10/2022 1038   Sepsis Labs: @LABRCNTIP (procalcitonin:4,lacticidven:4)  )No results found for this or any previous visit (from the past 240 hour(s)).    Studies: No results found.  Scheduled Meds:  amLODipine  10 mg Oral Daily   aspirin EC  81 mg Oral Daily   atorvastatin  80 mg Oral Daily   buprenorphine-naloxone  0.5 tablet Sublingual Daily   clopidogrel  75 mg Oral Daily   fluticasone furoate-vilanterol  1 puff Inhalation  Daily   furosemide  40 mg Intravenous BID   heparin  5,000 Units Subcutaneous Q8H   hydrALAZINE  25 mg Oral TID   insulin aspart  0-5 Units Subcutaneous QHS   insulin aspart  0-9 Units Subcutaneous TID WC   insulin glargine-yfgn  10 Units Subcutaneous Daily   loratadine  10 mg Oral Daily   pantoprazole  40 mg Oral Q1200   QUEtiapine  300 mg Oral QHS   sertraline  50 mg Oral QPM    Continuous Infusions:   LOS: 0 days     Alma Friendly, MD [facility]  If 7PM-7AM, please contact night-coverage [email or web address] 03/13/2022, 5:14 PM

## 2022-03-13 NOTE — Progress Notes (Signed)
SATURATION QUALIFICATIONS: (This note is used to comply with regulatory documentation for home oxygen)  Patient Saturations on Room Air at Rest = 95%  Patient Saturations on Room Air while Ambulating = 93%   

## 2022-03-14 ENCOUNTER — Observation Stay (HOSPITAL_COMMUNITY): Payer: Medicare Other

## 2022-03-14 ENCOUNTER — Encounter: Payer: Self-pay | Admitting: Internal Medicine

## 2022-03-14 DIAGNOSIS — Z7902 Long term (current) use of antithrombotics/antiplatelets: Secondary | ICD-10-CM | POA: Diagnosis not present

## 2022-03-14 DIAGNOSIS — Z7982 Long term (current) use of aspirin: Secondary | ICD-10-CM | POA: Diagnosis not present

## 2022-03-14 DIAGNOSIS — F17210 Nicotine dependence, cigarettes, uncomplicated: Secondary | ICD-10-CM | POA: Diagnosis present

## 2022-03-14 DIAGNOSIS — E1122 Type 2 diabetes mellitus with diabetic chronic kidney disease: Secondary | ICD-10-CM | POA: Diagnosis present

## 2022-03-14 DIAGNOSIS — M549 Dorsalgia, unspecified: Secondary | ICD-10-CM | POA: Diagnosis present

## 2022-03-14 DIAGNOSIS — K219 Gastro-esophageal reflux disease without esophagitis: Secondary | ICD-10-CM | POA: Diagnosis present

## 2022-03-14 DIAGNOSIS — E669 Obesity, unspecified: Secondary | ICD-10-CM | POA: Diagnosis present

## 2022-03-14 DIAGNOSIS — I5033 Acute on chronic diastolic (congestive) heart failure: Secondary | ICD-10-CM | POA: Diagnosis present

## 2022-03-14 DIAGNOSIS — Z7984 Long term (current) use of oral hypoglycemic drugs: Secondary | ICD-10-CM | POA: Diagnosis not present

## 2022-03-14 DIAGNOSIS — F419 Anxiety disorder, unspecified: Secondary | ICD-10-CM | POA: Diagnosis present

## 2022-03-14 DIAGNOSIS — I130 Hypertensive heart and chronic kidney disease with heart failure and stage 1 through stage 4 chronic kidney disease, or unspecified chronic kidney disease: Secondary | ICD-10-CM | POA: Diagnosis present

## 2022-03-14 DIAGNOSIS — Z794 Long term (current) use of insulin: Secondary | ICD-10-CM | POA: Diagnosis not present

## 2022-03-14 DIAGNOSIS — I2510 Atherosclerotic heart disease of native coronary artery without angina pectoris: Secondary | ICD-10-CM

## 2022-03-14 DIAGNOSIS — G9341 Metabolic encephalopathy: Secondary | ICD-10-CM | POA: Diagnosis present

## 2022-03-14 DIAGNOSIS — F418 Other specified anxiety disorders: Secondary | ICD-10-CM

## 2022-03-14 DIAGNOSIS — N1832 Chronic kidney disease, stage 3b: Secondary | ICD-10-CM

## 2022-03-14 DIAGNOSIS — N184 Chronic kidney disease, stage 4 (severe): Secondary | ICD-10-CM | POA: Diagnosis present

## 2022-03-14 DIAGNOSIS — K732 Chronic active hepatitis, not elsewhere classified: Secondary | ICD-10-CM | POA: Diagnosis present

## 2022-03-14 DIAGNOSIS — E7800 Pure hypercholesterolemia, unspecified: Secondary | ICD-10-CM

## 2022-03-14 DIAGNOSIS — E162 Hypoglycemia, unspecified: Secondary | ICD-10-CM | POA: Diagnosis present

## 2022-03-14 DIAGNOSIS — G8929 Other chronic pain: Secondary | ICD-10-CM | POA: Diagnosis present

## 2022-03-14 DIAGNOSIS — E11649 Type 2 diabetes mellitus with hypoglycemia without coma: Secondary | ICD-10-CM | POA: Diagnosis present

## 2022-03-14 DIAGNOSIS — Z79899 Other long term (current) drug therapy: Secondary | ICD-10-CM | POA: Diagnosis not present

## 2022-03-14 DIAGNOSIS — G20 Parkinson's disease: Secondary | ICD-10-CM | POA: Diagnosis present

## 2022-03-14 DIAGNOSIS — F32A Depression, unspecified: Secondary | ICD-10-CM | POA: Diagnosis present

## 2022-03-14 DIAGNOSIS — Z79890 Hormone replacement therapy (postmenopausal): Secondary | ICD-10-CM | POA: Diagnosis not present

## 2022-03-14 DIAGNOSIS — D631 Anemia in chronic kidney disease: Secondary | ICD-10-CM | POA: Diagnosis present

## 2022-03-14 DIAGNOSIS — I509 Heart failure, unspecified: Secondary | ICD-10-CM

## 2022-03-14 LAB — GLUCOSE, CAPILLARY
Glucose-Capillary: 113 mg/dL — ABNORMAL HIGH (ref 70–99)
Glucose-Capillary: 203 mg/dL — ABNORMAL HIGH (ref 70–99)
Glucose-Capillary: 149 mg/dL — ABNORMAL HIGH (ref 70–99)
Glucose-Capillary: 201 mg/dL — ABNORMAL HIGH (ref 70–99)

## 2022-03-14 LAB — CBC WITH DIFFERENTIAL/PLATELET
Abs Immature Granulocytes: 0.02 10*3/uL (ref 0.00–0.07)
Basophils Absolute: 0 10*3/uL (ref 0.0–0.1)
Basophils Relative: 0 %
Eosinophils Absolute: 0.3 10*3/uL (ref 0.0–0.5)
Eosinophils Relative: 5 %
HCT: 29 % — ABNORMAL LOW (ref 36.0–46.0)
Hemoglobin: 9.5 g/dL — ABNORMAL LOW (ref 12.0–15.0)
Immature Granulocytes: 0 %
Lymphocytes Relative: 20 %
Lymphs Abs: 1.3 10*3/uL (ref 0.7–4.0)
MCH: 28.3 pg (ref 26.0–34.0)
MCHC: 32.8 g/dL (ref 30.0–36.0)
MCV: 86.3 fL (ref 80.0–100.0)
Monocytes Absolute: 0.9 10*3/uL (ref 0.1–1.0)
Monocytes Relative: 14 %
Neutro Abs: 3.9 10*3/uL (ref 1.7–7.7)
Neutrophils Relative %: 61 %
Platelets: 255 10*3/uL (ref 150–400)
RBC: 3.36 MIL/uL — ABNORMAL LOW (ref 3.87–5.11)
RDW: 14.2 % (ref 11.5–15.5)
WBC: 6.5 10*3/uL (ref 4.0–10.5)
nRBC: 0 % (ref 0.0–0.2)

## 2022-03-14 LAB — BASIC METABOLIC PANEL
Anion gap: 10 (ref 5–15)
BUN: 32 mg/dL — ABNORMAL HIGH (ref 6–20)
CO2: 23 mmol/L (ref 22–32)
Calcium: 8.8 mg/dL — ABNORMAL LOW (ref 8.9–10.3)
Chloride: 105 mmol/L (ref 98–111)
Creatinine, Ser: 2.75 mg/dL — ABNORMAL HIGH (ref 0.44–1.00)
GFR, Estimated: 19 mL/min — ABNORMAL LOW (ref >60)
Glucose, Bld: 124 mg/dL — ABNORMAL HIGH (ref 70–99)
Potassium: 4 mmol/L (ref 3.5–5.1)
Sodium: 138 mmol/L (ref 135–145)

## 2022-03-14 MED ORDER — GABAPENTIN 100 MG PO CAPS
100.0000 mg | ORAL_CAPSULE | Freq: Two times a day (BID) | ORAL | Status: DC
Start: 2022-03-14 — End: 2022-03-17
  Administered 2022-03-14 – 2022-03-17 (×6): 100 mg via ORAL
  Filled 2022-03-14 (×6): qty 1

## 2022-03-14 MED ORDER — OXYCODONE-ACETAMINOPHEN 5-325 MG PO TABS
1.0000 | ORAL_TABLET | Freq: Four times a day (QID) | ORAL | Status: DC | PRN
  Administered 2022-03-14 – 2022-03-17 (×9): 2 via ORAL
  Filled 2022-03-14 (×9): qty 2

## 2022-03-14 NOTE — Progress Notes (Signed)
PROGRESS NOTE    Carol [name]  SWN:[id number] DOB: [birth year] DOA: 03/10/2022 PCP: Simona Huh, NP    Brief Narrative:   Carol [name] is a 61 years old female with medical history significant of insulin-dependent type 2 diabetes, hypertension, cocaine use, CKD stage IIIb, CAD with recent NSTEMI in May 2023 managed medically, chronic diastolic CHF, hyperlipidemia, GERD, anxiety, depression, Parkinson's disease who was admitted a month ago for volume overload and hypertensive urgency/emergency secondary to cocaine abuse and medication noncompliance presented to hospital with hypoglycemia.  Patient was brought into the ED by EMS for hypoglycemia with blood glucose level of 27.  Patient was reported to be diaphoretic with decreased responsiveness at home.  She received D50 with improvement in mental status but was still confused.  Patient was noncompliant to her diet and medication regimen at home.  She also was noted to have 3+ bilateral lower extremity edema.  Patient was then admitted hospital for further evaluation and treatment.     Assessment and Plan: Principal Problem:   Hypoglycemia Active Problems:   Acute on chronic diastolic CHF (congestive heart failure) (HCC)   Diabetes mellitus (HCC)   Pure hypercholesterolemia   CAD in native artery   Anxiety with depression   CKD (chronic kidney disease), stage IV (HCC)   Acute metabolic encephalopathy   Hypoglycemia in the setting of type II diabetes mellitus Resolved Patient was on glimepiride 4 mg twice daily, Lantus 20 units daily, Humalog 10 units 3 times daily, and metformin 500 mg twice daily at home..  It seems patient is taking a higher dose of insulin than what was prescribed (Lantus 60 units daily and Humalog 15 units 3 times daily).  Patient received IV dextrose D50 bolus.  Now has been transitioned to sliding scale insulin and semaglutide.  Continue hypoglycemic protocol.   Acute metabolic  encephalopathy Resolved.  Likely secondary to hypoglycemia.   Acute on chronic diastolic CHF with bilateral gross peripheral edema. 2D Echo done in May 2023 showing preserved EF and grade 1 diastolic dysfunction.Chest x-ray showing mild perihilar edema.  Patient continues to complain shortness of breath after taking pills.  We will get repeat chest x-ray today.BNP on presentation at  687.  Continue IV Lasix.  Continue strict intake and output charting Daily weights.  Negative balance for 3235 ml.   CKD stage IV Creatinine 2.9 on admission.  We will continue to monitor while on diuresis.  Creatinine today at 2.7.   Anemia of chronic kidney disease Hemoglobin of 9.5.  We will continue to monitor.   Essential hypertension Beta-blocker on hold.  On hydralazine 3 times daily.   CAD Continue DAPT and statin   Hyperlipidemia Continue Lipitor   Anxiety with depression Continue Zoloft and Seroquel    Obesity Body mass index is 36.9 kg/m.  Would benefit from weight loss as outpatient.   DVT prophylaxis: heparin injection 5,000 Units Start: 03/10/22 0645   Code Status:     Code Status: Full Code  Disposition: Observation  Status is: Observation  The patient will require care spanning > 2 midnights and should be moved to inpatient because: Volume overload, hypoglycemia, IV diuresis   Family Communication: None at bedside  Consultants:  None  Procedures:  None  Antimicrobials:  None  Anti-infectives (From admission, onward)    None      Subjective: Today, patient was seen and examined at bedside.  Nursing staff reports that she gets short winded after taking her morning medications.  Denies  any chest pain, nausea, vomiting.  Objective: Vitals:   03/13/22 1943 03/14/22 0013 03/14/22 0442 03/14/22 0809  BP: (!) 151/68 (!) 109/57 119/64 (!) 113/56  Pulse: 75 68 71 75  Resp: 20 16 18 16   Temp: 98.8 F (37.1 C) 98.2 F (36.8 C) 98.5 F (36.9 C) 99.2 F (37.3 C)   TempSrc: Oral Oral Oral Oral  SpO2: 100% 100% 100% 95%  Weight:      Height:        Intake/Output Summary (Last 24 hours) at 03/14/2022 0958 Last data filed at 03/14/2022 6720 Gross per 24 hour  Intake 240 ml  Output 2125 ml  Net -1885 ml   Filed Weights   03/10/22 2018 03/11/22 0500 03/12/22 0412  Weight: 97.7 kg 97.7 kg 97.5 kg    Physical Examination: Body mass index is 36.9 kg/m.   General: Obese built, not in obvious distress, on nasal cannula oxygen HENT:   No scleral pallor or icterus noted. Oral mucosa is moist.  Chest:   Diminished breath sounds bilaterally.  Coarse breath sounds noted CVS: S1 &S2 heard. No murmur.  Regular rate and rhythm. Abdomen: Soft, nontender, nondistended.  Bowel sounds are heard.   Extremities: No cyanosis, clubbing with bilateral lower extremity pitting edema.  Peripheral pulses are palpable. Psych: Alert, awake and oriented, normal mood CNS:  No cranial nerve deficits.  Power equal in all extremities.   Skin: Warm and dry.  No rashes noted.  Data Reviewed:   CBC: Recent Labs  Lab 03/10/22 0257 03/11/22 0108 03/12/22 0512 03/13/22 0143 03/14/22 0119  WBC 4.7 5.3 4.4 4.9 6.5  NEUTROABS 2.4 2.3 1.8 2.3 3.9  HGB 8.4* 8.9* 9.4* 8.7* 9.5*  HCT 26.3* 26.9* 27.7* 26.3* 29.0*  MCV 87.7 85.1 84.2 85.1 86.3  PLT 220 230 257 241 947    Basic Metabolic Panel: Recent Labs  Lab 03/10/22 0257 03/11/22 0108 03/12/22 0512 03/13/22 0143 03/14/22 0119  NA 135 137 135 134* 138  K 3.7 4.0 4.0 4.1 4.0  CL 106 106 105 103 105  CO2 21* 23 24 24 23   GLUCOSE 59* 146* 170* 196* 124*  BUN 27* 23* 27* 30* 32*  CREATININE 2.93* 2.40* 2.50* 2.63* 2.75*  CALCIUM 7.9* 8.3* 8.4* 8.3* 8.8*    Liver Function Tests: Recent Labs  Lab 03/10/22 0257  AST 36  ALT 16  ALKPHOS 134*  BILITOT 0.6  PROT 6.0*  ALBUMIN 2.4*     Radiology Studies: No results found.    LOS: 0 days    Flora Lipps, MD [facility] Available via Epic  secure chat 7am-7pm After these hours, please refer to coverage provider listed on [email or web address] 03/14/2022, 9:58 AM

## 2022-03-14 NOTE — Progress Notes (Signed)
Mobility Specialist Progress Note    03/14/22 1040  Mobility  Activity Ambulated with assistance in hallway  Level of Assistance Standby assist, set-up cues, supervision of patient - no hands on  Assistive Device Front wheel walker  Distance Ambulated (ft) 470 ft  Activity Response Tolerated well  $Mobility charge 1 Mobility   Pre-Mobility: 75 HR, 116/81 BP, 99% SpO2 During Mobility: 95% SpO2 Post-Mobility: 86 HR, 96% SpO2  Pt received in bed and agreeable. No complaints on walk. Maintained SpO2 >/=93% on RA. Returned to sitting EOB with call bell in reach.    Hildred Alamin Mobility Specialist

## 2022-03-15 DIAGNOSIS — G9341 Metabolic encephalopathy: Secondary | ICD-10-CM | POA: Diagnosis not present

## 2022-03-15 DIAGNOSIS — E162 Hypoglycemia, unspecified: Secondary | ICD-10-CM | POA: Diagnosis not present

## 2022-03-15 DIAGNOSIS — F418 Other specified anxiety disorders: Secondary | ICD-10-CM | POA: Diagnosis not present

## 2022-03-15 DIAGNOSIS — I5033 Acute on chronic diastolic (congestive) heart failure: Secondary | ICD-10-CM | POA: Diagnosis not present

## 2022-03-15 LAB — BASIC METABOLIC PANEL
Anion gap: 6 (ref 5–15)
BUN: 34 mg/dL — ABNORMAL HIGH (ref 6–20)
CO2: 26 mmol/L (ref 22–32)
Calcium: 8.4 mg/dL — ABNORMAL LOW (ref 8.9–10.3)
Chloride: 102 mmol/L (ref 98–111)
Creatinine, Ser: 2.66 mg/dL — ABNORMAL HIGH (ref 0.44–1.00)
GFR, Estimated: 20 mL/min — ABNORMAL LOW (ref >60)
Glucose, Bld: 164 mg/dL — ABNORMAL HIGH (ref 70–99)
Potassium: 3.8 mmol/L (ref 3.5–5.1)
Sodium: 134 mmol/L — ABNORMAL LOW (ref 135–145)

## 2022-03-15 LAB — GLUCOSE, CAPILLARY
Glucose-Capillary: 117 mg/dL — ABNORMAL HIGH (ref 70–99)
Glucose-Capillary: 174 mg/dL — ABNORMAL HIGH (ref 70–99)
Glucose-Capillary: 245 mg/dL — ABNORMAL HIGH (ref 70–99)
Glucose-Capillary: 167 mg/dL — ABNORMAL HIGH (ref 70–99)

## 2022-03-15 LAB — CBC
HCT: 26.8 % — ABNORMAL LOW (ref 36.0–46.0)
Hemoglobin: 8.8 g/dL — ABNORMAL LOW (ref 12.0–15.0)
MCH: 27.8 pg (ref 26.0–34.0)
MCHC: 32.8 g/dL (ref 30.0–36.0)
MCV: 84.8 fL (ref 80.0–100.0)
Platelets: 212 10*3/uL (ref 150–400)
RBC: 3.16 MIL/uL — ABNORMAL LOW (ref 3.87–5.11)
RDW: 14.3 % (ref 11.5–15.5)
WBC: 5.5 10*3/uL (ref 4.0–10.5)
nRBC: 0 % (ref 0.0–0.2)

## 2022-03-15 LAB — MAGNESIUM: Magnesium: 1.4 mg/dL — ABNORMAL LOW (ref 1.7–2.4)

## 2022-03-15 MED ORDER — MAGNESIUM OXIDE -MG SUPPLEMENT 400 (240 MG) MG PO TABS
400.0000 mg | ORAL_TABLET | Freq: Two times a day (BID) | ORAL | Status: DC
  Administered 2022-03-15 – 2022-03-17 (×5): 400 mg via ORAL
  Filled 2022-03-15 (×5): qty 1

## 2022-03-15 MED ORDER — MAGNESIUM SULFATE 2 GM/50ML IV SOLN
2.0000 g | Freq: Once | INTRAVENOUS | Status: AC
  Administered 2022-03-15: 2 g via INTRAVENOUS
  Filled 2022-03-15: qty 50

## 2022-03-15 NOTE — Progress Notes (Signed)
Mobility Specialist: Progress Note   03/15/22 1106  Mobility  Activity Ambulated with assistance in hallway  Level of Assistance Standby assist, set-up cues, supervision of patient - no hands on  Assistive Device Front wheel walker  Distance Ambulated (ft) 470 ft  Activity Response Tolerated well  $Mobility charge 1 Mobility   Pre-Mobility on 1 L/min [place]: 71 HR, 99% SpO2 Post-Mobility on RA: 83 HR, 100% SpO2  Pt received in the bed and agreeable to mobility. Ambulated on RA. Pt c/o back pain after returning to the room, otherwise asymptomatic. Pt back to bed after session with call bell at her side and RN present in the room.   [facility] Camauri Craton Mobility Specialist Mobility Specialist [address]: [phone]

## 2022-03-15 NOTE — Progress Notes (Signed)
PROGRESS NOTE    Carol [name]  HER:[id number] DOB: 01-16-1961 DOA: 03/10/2022 PCP: Simona Huh, NP    Brief Narrative:   Carol [name] is a 61 years old female with medical history significant of insulin-dependent type 2 diabetes, hypertension, cocaine use, CKD stage IIIb, CAD with recent NSTEMI in May 2023 managed medically, chronic diastolic CHF, hyperlipidemia, GERD, anxiety, depression, Parkinson's disease who was admitted a month ago for volume overload and hypertensive urgency/emergency secondary to cocaine abuse and medication noncompliance presented to the hospital with hypoglycemia.  Patient was brought into the ED by EMS for hypoglycemia with blood glucose level of 27.  Patient was reported to be diaphoretic with decreased responsiveness at home.  She received D50 with improvement in mental status but was still confused.  Patient was noncompliant to her diet and medication regimen at home.  She also was noted to have 3+ bilateral lower extremity edema as well.  Patient was then admitted hospital for further evaluation and treatment.     Assessment and Plan: Principal Problem:   Hypoglycemia Active Problems:   Acute on chronic diastolic CHF (congestive heart failure) (HCC)   Diabetes mellitus (HCC)   Pure hypercholesterolemia   CAD in native artery   Anxiety with depression   CKD (chronic kidney disease), stage IV (HCC)   Acute metabolic encephalopathy   CHF (congestive heart failure) (HCC)   Hypomagnesemia   Hypoglycemia in the setting of type II diabetes mellitus Resolved Patient was on glimepiride 4 mg twice daily, Lantus 20 units daily, Humalog 10 units 3 times daily, and metformin 500 mg twice daily at home..  It seems patient is taking a higher dose of insulin than what was prescribed (Lantus 60 units daily and Humalog 15 units 3 times daily).  Now has been transitioned to sliding scale insulin and semglee. Continue hypoglycemic protocol.   Acute  metabolic encephalopathy Resolved.  Likely secondary to hypoglycemia.   Acute on chronic diastolic CHF with bilateral gross peripheral edema. 2D Echo done in May 2023 showing preserved EF and grade 1 diastolic dysfunction.Chest x-ray showing mild perihilar edema.  Repeat x-ray of the chest on 03/14/2022 showed some pulmonary vascular congestion.Marland KitchenBNP on presentation at  687.  Continue IV Lasix at 40 mg IV twice daily.  Patient takes 20 mg daily at home.  Continue strict intake and output charting Daily weights.  Negative balance for 4415 ml.  Creatinine today at 2.6.  We will continue to monitor BMP.   CKD stage IV Creatinine 2.9 on admission.  We will continue to monitor while on diuresis.  Creatinine today at 2.6 from 2.7.  We will continue to monitor while on diuretic.  Hypomagnesemia we will replenish through IV and orally.  Check levels in AM.   Anemia of chronic kidney disease Latest Hemoglobin of 8.8.  We will continue to monitor.   Essential hypertension Beta-blocker on hold. Continue  hydralazine 3 times daily.   CAD Continue DAPT and statin   Hyperlipidemia Continue Lipitor   Anxiety with depression Continue Zoloft and Seroquel    Obesity Body mass index is 36.9 kg/m.  Would benefit from weight loss as outpatient.   DVT prophylaxis: heparin injection 5,000 Units Start: 03/10/22 0645   Code Status:     Code Status: Full Code  Disposition: Home likely in 2 days  Status is: inpatient  The patient is inpatient because: Volume overload, hypoglycemia, IV diuresis, decompensated still.   Family Communication:  Spoke with the patient's significant other at  bedside.  Consultants:  None  Procedures:  None  Antimicrobials:  None  Anti-infectives (From admission, onward)    None      Subjective: Today, patient was seen and examined at bedside.  Patient feels overall better with breathing.  She complains shortness of breath taking morning medications.  No chest  pain, nausea, vomiting, fever or chills.  Objective: Vitals:   03/14/22 1623 03/14/22 2012 03/14/22 2253 03/15/22 0354  BP: 133/73 129/71 (!) 138/57 131/80  Pulse: 79 77 78 67  Resp: 14 16 15 16   Temp: 98.5 F (36.9 C) 99.1 F (37.3 C) 99 F (37.2 C) 97.7 F (36.5 C)  TempSrc: Oral Oral Oral Oral  SpO2: 99% 98% 96% 98%  Weight:      Height:        Intake/Output Summary (Last 24 hours) at 03/15/2022 0742 Last data filed at 03/14/2022 2200 Gross per 24 hour  Intake 720 ml  Output 2400 ml  Net -1680 ml   Filed Weights   03/10/22 2018 03/11/22 0500 03/12/22 0412  Weight: 97.7 kg 97.7 kg 97.5 kg    Physical Examination: Body mass index is 36.9 kg/m.   General:  Obese built, not in obvious distress HENT:   No scleral pallor or icterus noted. Oral mucosa is moist.  Chest: Diminished breath sounds bilaterally. .  CVS: S1 &S2 heard. No murmur.  Regular rate and rhythm. Abdomen: Soft, nontender, nondistended.  Bowel sounds are heard.   Extremities: No cyanosis, clubbing with bilateral lower extremity pitting edema+++.  Peripheral pulses are palpable. Psych: Alert, awake and oriented, normal mood CNS:  No cranial nerve deficits.  Power equal in all extremities.   Skin: Warm and dry.  No rashes noted.   Data Reviewed:   CBC: Recent Labs  Lab 03/10/22 0257 03/11/22 0108 03/12/22 0512 03/13/22 0143 03/14/22 0119 03/15/22 0127  WBC 4.7 5.3 4.4 4.9 6.5 5.5  NEUTROABS 2.4 2.3 1.8 2.3 3.9  --   HGB 8.4* 8.9* 9.4* 8.7* 9.5* 8.8*  HCT 26.3* 26.9* 27.7* 26.3* 29.0* 26.8*  MCV 87.7 85.1 84.2 85.1 86.3 84.8  PLT 220 230 257 241 255 097    Basic Metabolic Panel: Recent Labs  Lab 03/11/22 0108 03/12/22 0512 03/13/22 0143 03/14/22 0119 03/15/22 0127  NA 137 135 134* 138 134*  K 4.0 4.0 4.1 4.0 3.8  CL 106 105 103 105 102  CO2 23 24 24 23 26   GLUCOSE 146* 170* 196* 124* 164*  BUN 23* 27* 30* 32* 34*  CREATININE 2.40* 2.50* 2.63* 2.75* 2.66*  CALCIUM 8.3* 8.4* 8.3*  8.8* 8.4*  MG  --   --   --   --  1.4*    Liver Function Tests: Recent Labs  Lab 03/10/22 0257  AST 36  ALT 16  ALKPHOS 134*  BILITOT 0.6  PROT 6.0*  ALBUMIN 2.4*     Radiology Studies: DG CHEST PORT 1 VIEW  Result Date: 03/14/2022 CLINICAL DATA:  Dyspnea EXAM: PORTABLE CHEST 1 VIEW COMPARISON:  Radiographs 03/09/2022 FINDINGS: No change from 03/09/2022. Cardiomegaly and pulmonary vascular congestion. No focal consolidation, pleural effusion, or pneumothorax. No acute osseous abnormality. IMPRESSION: Cardiomegaly and pulmonary vascular congestion. Electronically Signed   By: Placido Sou M.D.   On: 03/14/2022 10:06      LOS: 1 day    Flora Lipps, MD [facility] Available via Epic secure chat 7am-7pm After these hours, please refer to coverage provider listed on [email or web address] 03/15/2022, 7:42 AM

## 2022-03-16 DIAGNOSIS — G9341 Metabolic encephalopathy: Secondary | ICD-10-CM | POA: Diagnosis not present

## 2022-03-16 DIAGNOSIS — E162 Hypoglycemia, unspecified: Secondary | ICD-10-CM | POA: Diagnosis not present

## 2022-03-16 DIAGNOSIS — I5033 Acute on chronic diastolic (congestive) heart failure: Secondary | ICD-10-CM | POA: Diagnosis not present

## 2022-03-16 DIAGNOSIS — F418 Other specified anxiety disorders: Secondary | ICD-10-CM | POA: Diagnosis not present

## 2022-03-16 LAB — CBC
HCT: 27.3 % — ABNORMAL LOW (ref 36.0–46.0)
Hemoglobin: 8.9 g/dL — ABNORMAL LOW (ref 12.0–15.0)
MCH: 28 pg (ref 26.0–34.0)
MCHC: 32.6 g/dL (ref 30.0–36.0)
MCV: 85.8 fL (ref 80.0–100.0)
Platelets: 225 10*3/uL (ref 150–400)
RBC: 3.18 MIL/uL — ABNORMAL LOW (ref 3.87–5.11)
RDW: 14.1 % (ref 11.5–15.5)
WBC: 5.4 10*3/uL (ref 4.0–10.5)
nRBC: 0 % (ref 0.0–0.2)

## 2022-03-16 LAB — BASIC METABOLIC PANEL
Anion gap: 12 (ref 5–15)
BUN: 35 mg/dL — ABNORMAL HIGH (ref 6–20)
CO2: 24 mmol/L (ref 22–32)
Calcium: 8.6 mg/dL — ABNORMAL LOW (ref 8.9–10.3)
Chloride: 100 mmol/L (ref 98–111)
Creatinine, Ser: 2.96 mg/dL — ABNORMAL HIGH (ref 0.44–1.00)
GFR, Estimated: 18 mL/min — ABNORMAL LOW (ref >60)
Glucose, Bld: 208 mg/dL — ABNORMAL HIGH (ref 70–99)
Potassium: 4.1 mmol/L (ref 3.5–5.1)
Sodium: 136 mmol/L (ref 135–145)

## 2022-03-16 LAB — GLUCOSE, CAPILLARY
Glucose-Capillary: 179 mg/dL — ABNORMAL HIGH (ref 70–99)
Glucose-Capillary: 190 mg/dL — ABNORMAL HIGH (ref 70–99)
Glucose-Capillary: 191 mg/dL — ABNORMAL HIGH (ref 70–99)
Glucose-Capillary: 242 mg/dL — ABNORMAL HIGH (ref 70–99)

## 2022-03-16 LAB — MAGNESIUM: Magnesium: 1.9 mg/dL (ref 1.7–2.4)

## 2022-03-16 MED ORDER — DIPHENHYDRAMINE HCL 25 MG PO CAPS
25.0000 mg | ORAL_CAPSULE | Freq: Four times a day (QID) | ORAL | Status: DC | PRN
Start: 2022-03-16 — End: 2022-03-17
  Administered 2022-03-16: 25 mg via ORAL
  Filled 2022-03-16: qty 1

## 2022-03-16 NOTE — Care Management Important Message (Signed)
Important Message  Patient Details  Name: Landrey Mahurin MRN: [id number] Date of Birth: 1961-01-23   Medicare Important Message Given:  Yes     Shelda Altes 03/16/2022, 9:13 AM

## 2022-03-16 NOTE — Progress Notes (Signed)
Mobility Specialist: Progress Note   03/16/22 1122  Mobility  Activity Ambulated with assistance in hallway  Level of Assistance Standby assist, set-up cues, supervision of patient - no hands on  Assistive Device Front wheel walker  Distance Ambulated (ft) 470 ft  Activity Response Tolerated well  $Mobility charge 1 Mobility   Pre-Mobility: 76 HR, 95% SpO2 Post-Mobility: 87 HR, 148/71 (94) BP, 96% SpO2  Pt received in the bed and agreeable to mobility. C/o L shoulder pain, no rating given. No c/o dizziness or SOB throughout. Pt sitting EOB after session with call bell at her side and NT present in the room.   [facility] Carol [name] Mobility Specialist Mobility Specialist [address]: [phone]

## 2022-03-16 NOTE — Progress Notes (Signed)
PROGRESS NOTE    Carol [name]  CHE:[id number] DOB: 25-Jun-1961 DOA: 03/10/2022 PCP: Simona Huh, NP    Brief Narrative:  Carol [name] is a 61 years old female with medical history significant of insulin-dependent type 2 diabetes, hypertension, cocaine use, CKD stage IIIb, CAD with recent NSTEMI in May 2023 managed medically, chronic diastolic CHF, hyperlipidemia, GERD, anxiety, depression, Parkinson's disease who was admitted a month ago for volume overload and hypertensive urgency/emergency secondary to cocaine abuse and medication noncompliance presented to the hospital with hypoglycemia.  Patient was brought into the ED by EMS for hypoglycemia with blood glucose level of 27.  Patient was reported to be diaphoretic with decreased responsiveness at home.  She received D50 with improvement in mental status but was still confused.  Patient was noncompliant to her diet and medication regimen at home.  She also was noted to have 3+ bilateral lower extremity edema as well.  Patient was then admitted hospital for further evaluation and treatment.     Assessment and Plan: Principal Problem:   Hypoglycemia Active Problems:   Acute on chronic diastolic CHF (congestive heart failure) (HCC)   Diabetes mellitus (HCC)   Pure hypercholesterolemia   CAD in native artery   Anxiety with depression   CKD (chronic kidney disease), stage IV (HCC)   Acute metabolic encephalopathy   CHF (congestive heart failure) (HCC)   Hypomagnesemia   Hypoglycemia in the setting of type II diabetes mellitus Resolved Patient was on glimepiride 4 mg twice daily, Lantus 20 units daily, Humalog 10 units 3 times daily, and metformin 500 mg twice daily at home..  It seems patient is taking a higher dose of insulin than what was prescribed (Lantus 60 units daily and Humalog 15 units 3 times daily).  Now has been transitioned to sliding scale insulin and semglee. Continue hypoglycemic protocol.  Will need to  adjust dose on discharge/ consider discontinuation of glimepiride.   Acute metabolic encephalopathy Resolved.  Likely secondary to hypoglycemia.   Acute on chronic diastolic CHF with bilateral gross peripheral edema. 2D Echo done in May 2023 showing preserved EF and grade 1 diastolic dysfunction.Chest x-ray showing mild perihilar edema.  Repeat x-ray of the chest on 03/14/2022 showed some pulmonary vascular congestion.Marland KitchenBNP on presentation at  687.  Continue IV Lasix at 40 mg IV twice daily.  Patient takes 20 mg daily at home.  Continue strict intake and output charting Daily weights.  Negative balance for 5675 ml.  Creatinine today at 2.9.  We will continue to monitor BMP.  Could consider torsemide on discharge.   CKD stage IV Creatinine 2.9 on admission.  We will continue to monitor while on diuresis.  Creatinine today at 2.9 from 2.6 < 2.7.  We will continue to monitor while on diuretic.  Hypomagnesemia improved after replacement.   Anemia of chronic kidney disease Latest Hemoglobin of 8.8.  We will continue to monitor.   Essential hypertension Beta-blocker on hold. Continue  hydralazine 3 times daily.   CAD Continue DAPT and statin   Hyperlipidemia Continue Lipitor   Anxiety with depression Continue Zoloft and Seroquel    Obesity Body mass index is 36.25 kg/m.  Would benefit from weight loss as outpatient.   DVT prophylaxis: heparin injection 5,000 Units Start: 03/10/22 0645   Code Status:     Code Status: Full Code  Disposition: Home likely on 03/17/2022 if continues to improve  Status is: inpatient  The patient is inpatient because: Volume overload,  IV diuresis, decompensated  heart failure.   Family Communication:  I again spoke with the patient's significant other at bedside.  Consultants:  None  Procedures:  None  Antimicrobials:  None  Anti-infectives (From admission, onward)    None      Subjective: Today, patient was seen and examined at  bedside.  Continues to feel better with breathing.  Has been diuresing.  No nausea vomiting fever or chills.    Objective: Vitals:   03/15/22 1954 03/15/22 2325 03/16/22 0415 03/16/22 0743  BP: (!) 152/74 (!) 114/59 (!) 108/57 (!) 98/51  Pulse: 77 77 65 67  Resp: 17 17 16 15   Temp: 98.3 F (36.8 C) 98.3 F (36.8 C) 98.5 F (36.9 C) 98.3 F (36.8 C)  TempSrc: Oral Oral Oral Oral  SpO2: 100% 94% 97% 96%  Weight:   95.8 kg   Height:        Intake/Output Summary (Last 24 hours) at 03/16/2022 1039 Last data filed at 03/16/2022 0419 Gross per 24 hour  Intake --  Output 1500 ml  Net -1500 ml    Filed Weights   03/11/22 0500 03/12/22 0412 03/16/22 0415  Weight: 97.7 kg 97.5 kg 95.8 kg    Physical Examination: Body mass index is 36.25 kg/m.   General: Obese built, not in obvious distress HENT:   No scleral pallor or icterus noted. Oral mucosa is moist.  Chest:    Diminished breath sounds bilaterally. No crackles or wheezes.  CVS: S1 &S2 heard. No murmur.  Regular rate and rhythm. Abdomen: Soft, nontender, nondistended.  Bowel sounds are heard.   Extremities: No cyanosis, clubbing with bilateral lower extremity edema but improved.  Peripheral pulses are palpable. Psych: Alert, awake and oriented, normal mood CNS:  No cranial nerve deficits.  Power equal in all extremities.   Skin: Warm and dry.  No rashes noted.  Data Reviewed:   CBC: Recent Labs  Lab 03/10/22 0257 03/11/22 0108 03/12/22 0512 03/13/22 0143 03/14/22 0119 03/15/22 0127 03/16/22 0110  WBC 4.7 5.3 4.4 4.9 6.5 5.5 5.4  NEUTROABS 2.4 2.3 1.8 2.3 3.9  --   --   HGB 8.4* 8.9* 9.4* 8.7* 9.5* 8.8* 8.9*  HCT 26.3* 26.9* 27.7* 26.3* 29.0* 26.8* 27.3*  MCV 87.7 85.1 84.2 85.1 86.3 84.8 85.8  PLT 220 230 257 241 255 212 225     Basic Metabolic Panel: Recent Labs  Lab 03/12/22 0512 03/13/22 0143 03/14/22 0119 03/15/22 0127 03/16/22 0110  NA 135 134* 138 134* 136  K 4.0 4.1 4.0 3.8 4.1  CL 105 103 105  102 100  CO2 24 24 23 26 24   GLUCOSE 170* 196* 124* 164* 208*  BUN 27* 30* 32* 34* 35*  CREATININE 2.50* 2.63* 2.75* 2.66* 2.96*  CALCIUM 8.4* 8.3* 8.8* 8.4* 8.6*  MG  --   --   --  1.4* 1.9     Liver Function Tests: Recent Labs  Lab 03/10/22 0257  AST 36  ALT 16  ALKPHOS 134*  BILITOT 0.6  PROT 6.0*  ALBUMIN 2.4*    Radiology Studies: No results found.    LOS: 2 days    Flora Lipps, MD [facility] Available via Epic secure chat 7am-7pm After these hours, please refer to coverage provider listed on [email or web address] 03/16/2022, 10:39 AM

## 2022-03-17 DIAGNOSIS — I5033 Acute on chronic diastolic (congestive) heart failure: Secondary | ICD-10-CM | POA: Diagnosis not present

## 2022-03-17 DIAGNOSIS — G9341 Metabolic encephalopathy: Secondary | ICD-10-CM | POA: Diagnosis not present

## 2022-03-17 DIAGNOSIS — E162 Hypoglycemia, unspecified: Secondary | ICD-10-CM | POA: Diagnosis not present

## 2022-03-17 DIAGNOSIS — F418 Other specified anxiety disorders: Secondary | ICD-10-CM | POA: Diagnosis not present

## 2022-03-17 LAB — GLUCOSE, CAPILLARY
Glucose-Capillary: 130 mg/dL — ABNORMAL HIGH (ref 70–99)
Glucose-Capillary: 264 mg/dL — ABNORMAL HIGH (ref 70–99)

## 2022-03-17 LAB — BASIC METABOLIC PANEL
Anion gap: 9 (ref 5–15)
BUN: 36 mg/dL — ABNORMAL HIGH (ref 6–20)
CO2: 25 mmol/L (ref 22–32)
Calcium: 8.6 mg/dL — ABNORMAL LOW (ref 8.9–10.3)
Chloride: 101 mmol/L (ref 98–111)
Creatinine, Ser: 3.22 mg/dL — ABNORMAL HIGH (ref 0.44–1.00)
GFR, Estimated: 16 mL/min — ABNORMAL LOW (ref >60)
Glucose, Bld: 197 mg/dL — ABNORMAL HIGH (ref 70–99)
Potassium: 4.1 mmol/L (ref 3.5–5.1)
Sodium: 135 mmol/L (ref 135–145)

## 2022-03-17 LAB — MAGNESIUM: Magnesium: 1.8 mg/dL (ref 1.7–2.4)

## 2022-03-17 MED ORDER — INSULIN GLARGINE 100 UNIT/ML SOLOSTAR PEN: 20.0000 [IU] | PEN_INJECTOR | Freq: Every day | SUBCUTANEOUS | Status: DC

## 2022-03-17 MED ORDER — MAGNESIUM 400 MG PO CAPS: 1.0000 | ORAL_CAPSULE | Freq: Every day | ORAL | 0 refills | Status: DC

## 2022-03-17 MED ORDER — TORSEMIDE 20 MG PO TABS: 20.0000 mg | ORAL_TABLET | Freq: Every day | ORAL | 2 refills | Status: DC

## 2022-03-17 NOTE — Discharge Summary (Addendum)
Physician Discharge Summary   Patient: Carol [name] MRN: [id number] DOB: 1960/10/10  Admit date:     03/10/2022  Discharge date: 03/17/22  Discharge Physician: Flora Lipps   PCP: Simona Huh, NP   Recommendations at discharge:   Follow-up with your primary care physician in 1 week.   Check CBC, BMP, magnesium in the next visit.   Follow-up with nephrology and Cardiology as outpatient.  Patient would need adjustment with diuretic regimen as outpatient  Discharge Diagnoses: Active Problems:   Acute on chronic diastolic CHF (congestive heart failure) (HCC)   Diabetes mellitus ([place])   Pure hypercholesterolemia   CAD in native artery   Anxiety with depression   CKD (chronic kidney disease), stage IV (HCC)   CHF (congestive heart failure) (HCC)  Principal Problem (Resolved):   Hypoglycemia Resolved Problems:   Acute metabolic encephalopathy   Hypomagnesemia  Hospital Course: Carol [name] is a 61 years old female with medical history significant of insulin-dependent type 2 diabetes, hypertension, cocaine use, CKD stage IIIb, CAD with recent NSTEMI in May 2023 managed medically, chronic diastolic CHF, hyperlipidemia, GERD, anxiety, depression, Parkinson's disease who was admitted a month ago for volume overload and hypertensive urgency/emergency secondary to cocaine abuse and medication noncompliance presented to the hospital with hypoglycemia.  Patient was brought into the ED by EMS for hypoglycemia with blood glucose level of 27.  Patient was reported to be diaphoretic with decreased responsiveness at home.  She received D50 with improvement in mental status but was still confused.  Patient was noncompliant to her diet and medication regimen at home.  She also was noted to have 3+ bilateral lower extremity edema as well.  Patient was then admitted hospital for further evaluation and treatment.    Following conditions were addressed during  hospitalization,  Hypoglycemia in the setting of type II diabetes mellitus Resolved Patient was on glimepiride 4 mg twice daily, Lantus 20 units daily, Humalog 10 units 3 times daily, and metformin 500 mg twice daily at home..  It seems patient is taking a higher dose of insulin than what was prescribed (Lantus 60 units daily and Humalog 15 units 3 times daily).  Glimepiride has been discontinued on discharge.   Acute metabolic encephalopathy Resolved.  Likely secondary to hypoglycemia.   Acute on chronic diastolic CHF with bilateral gross peripheral edema. 2D Echo done in May 2023 showing preserved EF and grade 1 diastolic dysfunction.Chest x-ray showing mild perihilar edema.  Repeat x-ray of the chest on 03/14/2022 showed some pulmonary vascular congestion.Marland KitchenBNP on presentation at  687.  Received IV Lasix at 40 mg IV twice daily.  Patient takes 20 mg daily at home.  Patient did have significant diuresis and was negative balance for 6475 mL prior to discharge.  Creatinine today a 3.2 from 2.9.  Patient will be initiated on torsemide 20 mg daily on discharge.  Would benefit from BMP in the next visit.  Discussed with the patient regarding fluid restriction and salt restriction.  Follow-up with PCP and cardiology as outpatient.   CKD stage IV Creatinine 2.9 on admission.  Patient received IV diuresis during hospitalization and creatinine prior to discharge was 3.2.  Patient nephrology follow-up.  Patient might need adjustment with diuretic regimen.  Hypomagnesemia improved after replacement.  Latest magnesium of 1.8.    Anemia of chronic kidney disease Latest Hemoglobin of 8.6.  Will need to follow-up with nephrology as outpatient.   Essential hypertension Resume home medication regimen including metoprolol and Benicar.  CAD Continue DAPT and statin   Hyperlipidemia Continue Lipitor   Anxiety with depression Continue Zoloft and Seroquel    Obesity Body mass index is 36.25 kg/m.  Would  benefit from weight loss as outpatient.  Consultants: None  Procedures performed: None  Disposition: Home  Diet recommendation:  Discharge Diet Orders (From admission, onward)     Start     Ordered   03/17/22 0000  Diet - low sodium heart healthy        03/17/22 1109   03/17/22 0000  Diet Carb Modified        03/17/22 1109           Cardiac and Carb modified diet DISCHARGE MEDICATION: Allergies as of 03/17/2022       Reactions   Penicillins Shortness Of Breath, Other (See Comments)   Caused yeast infection Has patient had a PCN reaction causing immediate rash, facial/tongue/throat swelling, SOB or lightheadedness with hypotension: Yes Has patient had a PCN reaction causing severe rash involving mucus membranes or skin necrosis: No Has patient had a PCN reaction that required hospitalization pt was in the hospital at the time of the reaction Has patient had a PCN reaction occurring within the last 10 years: No If all of the above answers are "NO", then may proceed with Cephalosp   Tramadol Other (See Comments)   Made her tongue raw        Medication List     STOP taking these medications    furosemide 40 MG tablet Commonly known as: LASIX   glimepiride 4 MG tablet Commonly known as: AMARYL       TAKE these medications    albuterol 108 (90 Base) MCG/ACT inhaler Commonly known as: VENTOLIN HFA Inhale 2 puffs into the lungs every 4 (four) hours as needed for wheezing or shortness of breath.   amLODipine 10 MG tablet Commonly known as: NORVASC Take 1 tablet (10 mg total) by mouth daily. What changed: when to take this   aspirin EC 81 MG tablet Take 1 tablet (81 mg total) by mouth daily. Swallow whole.   atorvastatin 80 MG tablet Commonly known as: LIPITOR Take 1 tablet (80 mg total) by mouth daily. What changed: when to take this   budesonide-formoterol 160-4.5 MCG/ACT inhaler Commonly known as: SYMBICORT Inhale 2 puffs into the lungs every  morning.   buprenorphine-naloxone 2-0.5 mg Subl SL tablet Commonly known as: SUBOXONE Place 0.5 tablets under the tongue daily.   cetirizine 10 MG tablet Commonly known as: ZYRTEC Take 10 mg by mouth every morning.   clopidogrel 75 MG tablet Commonly known as: PLAVIX Take 1 tablet (75 mg total) by mouth daily. What changed: when to take this   ergocalciferol 1.25 MG (50000 UT) capsule Commonly known as: VITAMIN D2 Take 50,000 Units by mouth once a week. Wednesday   FREESTYLE TEST STRIPS test strip Generic drug: glucose blood 1 each by Other route as needed for other.   gabapentin 100 MG capsule Commonly known as: NEURONTIN Take 100 mg by mouth 2 (two) times daily.   hydrALAZINE 25 MG tablet Commonly known as: APRESOLINE Take 25 mg by mouth 3 (three) times daily. What changed: Another medication with the same name was removed. Continue taking this medication, and follow the directions you see here.   hydrOXYzine 50 MG tablet Commonly known as: ATARAX Take 50 mg by mouth daily as needed for anxiety or itching.   insulin glargine 100 UNIT/ML Solostar Pen Commonly known as: LANTUS  Inject 20 Units into the skin at bedtime. What changed: when to take this   insulin lispro 100 UNIT/ML KwikPen Commonly known as: HUMALOG Inject 10 Units into the skin 3 (three) times daily after meals.   Magnesium 400 MG Caps Take 1 capsule by mouth daily.   metFORMIN 500 MG tablet Commonly known as: GLUCOPHAGE Take 500 mg by mouth 2 (two) times daily.   Nebivolol HCl 20 MG Tabs Take 20 mg by mouth every morning.   nitroGLYCERIN 0.4 MG SL tablet Commonly known as: NITROSTAT Place 0.4 mg under the tongue every 5 (five) minutes as needed for chest pain.   olmesartan-hydrochlorothiazide 40-25 MG tablet Commonly known as: BENICAR HCT Take 1 tablet by mouth daily.   oxyCODONE-acetaminophen 5-325 MG tablet Commonly known as: PERCOCET/ROXICET Take 1-2 tablets by mouth every 6 (six)  hours as needed for severe pain.   pantoprazole 40 MG tablet Commonly known as: PROTONIX Take 1 tablet (40 mg total) by mouth daily at 12 noon. What changed: when to take this   QUEtiapine 300 MG 24 hr tablet Commonly known as: SEROQUEL XR Take 300 mg by mouth at bedtime.   sertraline 50 MG tablet Commonly known as: ZOLOFT Take 50 mg by mouth every evening.   torsemide 20 MG tablet Commonly known as: DEMADEX Take 1 tablet (20 mg total) by mouth daily.   triamcinolone ointment 0.1 % Commonly known as: KENALOG Apply 1 application. topically 2 (two) times daily.       Subjective Patient was seen and examined at bedside.  Feels better with breathing.  At her baseline.  No chest pain, shortness of breath fever chills  Discharge Exam: Filed Weights   03/12/22 0412 03/16/22 0415 03/17/22 0431  Weight: 97.5 kg 95.8 kg 98.9 kg   Vitals:   03/17/22 0852 03/17/22 0927  BP:  (!) 149/69  Pulse:  91  Resp:  20  Temp:  98.5 F (36.9 C)  SpO2: 100% 92%  General: Obese built, not in obvious distress HENT:   No scleral pallor or icterus noted. Oral mucosa is moist.  Chest:    Diminished breath sounds bilaterally. No crackles or wheezes.  CVS: S1 &S2 heard. No murmur.  Regular rate and rhythm. Abdomen: Soft, nontender, nondistended.  Bowel sounds are heard.   Extremities: No cyanosis, clubbing with bilateral lower extremity edema.  Peripheral pulses are palpable. Psych: Alert, awake and oriented, normal mood CNS:  No cranial nerve deficits.  Power equal in all extremities.   Skin: Warm and dry.  No rashes noted.   Condition at discharge: good  The results of significant diagnostics from this hospitalization (including imaging, microbiology, ancillary and laboratory) are listed below for reference.   Imaging Studies: DG CHEST PORT 1 VIEW  Result Date: 03/14/2022 CLINICAL DATA:  Dyspnea EXAM: PORTABLE CHEST 1 VIEW COMPARISON:  Radiographs 03/09/2022 FINDINGS: No change from  03/09/2022. Cardiomegaly and pulmonary vascular congestion. No focal consolidation, pleural effusion, or pneumothorax. No acute osseous abnormality. IMPRESSION: Cardiomegaly and pulmonary vascular congestion. Electronically Signed   By: Placido Sou M.D.   On: 03/14/2022 10:06   CT Head Wo Contrast  Result Date: 03/10/2022 CLINICAL DATA:  Mental status change with unknown cause EXAM: CT HEAD WITHOUT CONTRAST TECHNIQUE: Contiguous axial images were obtained from the base of the skull through the vertex without intravenous contrast. RADIATION DOSE REDUCTION: This exam was performed according to the departmental dose-optimization program which includes automated exposure control, adjustment of the mA and/or kV according to  patient size and/or use of iterative reconstruction technique. COMPARISON:  01/22/2022 FINDINGS: Brain: No evidence of acute infarction, hemorrhage, hydrocephalus, extra-axial collection or mass lesion/mass effect. 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 Chronic lacune at the anterior left putamen. Vascular: No hyperdense vessel or unexpected calcification. Skull: Normal. Negative for fracture or focal lesion. Sinuses/Orbits: No acute finding. IMPRESSION: Stable head CT.  No acute finding. Electronically Signed   By: Jorje Guild M.D.   On: 03/10/2022 05:04   DG Chest Portable 1 View  Result Date: 03/10/2022 CLINICAL DATA:  Weakness, hypoglycemia EXAM: PORTABLE CHEST 1 VIEW COMPARISON:  CT chest dated 01/22/2022 FINDINGS: Cardiomegaly with mild perihilar edema. No definite pleural effusions. No pneumothorax. IMPRESSION: Cardiomegaly with mild perihilar edema. Electronically Signed   By: Julian Hy M.D.   On: 03/10/2022 03:28    Microbiology: Results for orders placed or performed during the hospital encounter of 01/10/22  Resp Panel by RT-PCR (Flu A&B, Covid) Nasopharyngeal Swab     Status: None   Collection Time: 01/10/22  9:49 AM   Specimen:  Nasopharyngeal Swab; Nasopharyngeal(NP) swabs in vial transport medium  Result Value Ref Range Status   SARS Coronavirus 2 by RT PCR NEGATIVE NEGATIVE Final    Comment: (NOTE) SARS-CoV-2 target nucleic acids are NOT DETECTED.  The SARS-CoV-2 RNA is generally detectable in upper respiratory specimens during the acute phase of infection. The lowest concentration of SARS-CoV-2 viral copies this assay can detect is 138 copies/mL. A negative result does not preclude SARS-Cov-2 infection and should not be used as the sole basis for treatment or other patient management decisions. A negative result may occur with  improper specimen collection/handling, submission of specimen other than nasopharyngeal swab, presence of viral mutation(s) within the areas targeted by this assay, and inadequate number of viral copies(<138 copies/mL). A negative result must be combined with clinical observations, patient history, and epidemiological information. The expected result is Negative.  Fact Sheet for Patients:  EntrepreneurPulse.com.au  Fact Sheet for Healthcare Providers:  IncredibleEmployment.be  This test is no t yet approved or cleared by the [place] FDA and  has been authorized for detection and/or diagnosis of SARS-CoV-2 by FDA under an Emergency Use Authorization (EUA). This EUA will remain  in effect (meaning this test can be used) for the duration of the COVID-19 declaration under Section 564(b)(1) of the Act, 21 U.S.C.section 360bbb-3(b)(1), unless the authorization is terminated  or revoked sooner.       Influenza A by PCR NEGATIVE NEGATIVE Final   Influenza B by PCR NEGATIVE NEGATIVE Final    Comment: (NOTE) The Xpert Xpress SARS-CoV-2/FLU/RSV plus assay is intended as an aid in the diagnosis of influenza from Nasopharyngeal swab specimens and should not be used as a sole basis for treatment. Nasal washings and aspirates are unacceptable for  Xpert Xpress SARS-CoV-2/FLU/RSV testing.  Fact Sheet for Patients: EntrepreneurPulse.com.au  Fact Sheet for Healthcare Providers: IncredibleEmployment.be  This test is not yet approved or cleared by the [place] FDA and has been authorized for detection and/or diagnosis of SARS-CoV-2 by FDA under an Emergency Use Authorization (EUA). This EUA will remain in effect (meaning this test can be used) for the duration of the COVID-19 declaration under Section 564(b)(1) of the Act, 21 U.S.C. section 360bbb-3(b)(1), unless the authorization is terminated or revoked.  Performed at [facility], [place] [place], [place] [address]     Labs: CBC: Recent Labs  Lab 03/11/22 [phone] 03/12/22 [phone] 03/13/22 0143  03/14/22 0119 03/15/22 0127 03/16/22 0110  WBC 5.3 4.4 4.9 6.5 5.5 5.4  NEUTROABS 2.3 1.8 2.3 3.9  --   --   HGB 8.9* 9.4* 8.7* 9.5* 8.8* 8.9*  HCT 26.9* 27.7* 26.3* 29.0* 26.8* 27.3*  MCV 85.1 84.2 85.1 86.3 84.8 85.8  PLT 230 257 241 255 212 834   Basic Metabolic Panel: Recent Labs  Lab 03/13/22 0143 03/14/22 0119 03/15/22 0127 03/16/22 0110 03/17/22 0210  NA 134* 138 134* 136 135  K 4.1 4.0 3.8 4.1 4.1  CL 103 105 102 100 101  CO2 24 23 26 24 25   GLUCOSE 196* 124* 164* 208* 197*  BUN 30* 32* 34* 35* 36*  CREATININE 2.63* 2.75* 2.66* 2.96* 3.22*  CALCIUM 8.3* 8.8* 8.4* 8.6* 8.6*  MG  --   --  1.4* 1.9 1.8   Liver Function Tests: No results for input(s): "AST", "ALT", "ALKPHOS", "BILITOT", "PROT", "ALBUMIN" in the last 168 hours. CBG: Recent Labs  Lab 03/16/22 1122 03/16/22 1548 03/16/22 2119 03/17/22 0612 03/17/22 1215  GLUCAP 190* 191* 242* 130* 264*    Discharge time spent: greater than 30 minutes.  Signed: Flora Lipps, MD [facility] 03/17/2022

## 2022-03-17 NOTE — Progress Notes (Signed)
Mobility Specialist: Progress Note   03/17/22 1048  Mobility  Activity Ambulated with assistance in hallway  Level of Assistance Modified independent, requires aide device or extra time  Assistive Device Front wheel walker  Distance Ambulated (ft) 470 ft  Activity Response Tolerated well  $Mobility charge 1 Mobility   During Mobility: 102 HR Post-Mobility: 99 HR  Received pt in bed having no complaints and agreeable to mobility. Pt was asymptomatic throughout ambulation and returned to room w/o fault. Left sitting EOB w/ call bell in reach and all needs met.  [facility] Tyrion Glaude Mobility Specialist Mobility Specialist [address]: [phone]

## 2022-03-22 ENCOUNTER — Inpatient Hospital Stay (HOSPITAL_COMMUNITY)
Admission: EM | Admit: 2022-03-22 | Discharge: 2022-03-27 | DRG: 291 | Disposition: A | Discharge status: Home Health | Payer: Medicare Other | Attending: Internal Medicine | Admitting: Internal Medicine

## 2022-03-22 ENCOUNTER — Other Ambulatory Visit: Payer: Self-pay

## 2022-03-22 ENCOUNTER — Emergency Department (HOSPITAL_COMMUNITY): Payer: Medicare Other

## 2022-03-22 ENCOUNTER — Encounter (HOSPITAL_COMMUNITY): Payer: Self-pay

## 2022-03-22 DIAGNOSIS — Z794 Long term (current) use of insulin: Secondary | ICD-10-CM

## 2022-03-22 DIAGNOSIS — Z823 Family history of stroke: Secondary | ICD-10-CM

## 2022-03-22 DIAGNOSIS — I130 Hypertensive heart and chronic kidney disease with heart failure and stage 1 through stage 4 chronic kidney disease, or unspecified chronic kidney disease: Principal | ICD-10-CM | POA: Diagnosis present

## 2022-03-22 DIAGNOSIS — F419 Anxiety disorder, unspecified: Secondary | ICD-10-CM | POA: Diagnosis present

## 2022-03-22 DIAGNOSIS — D509 Iron deficiency anemia, unspecified: Secondary | ICD-10-CM | POA: Diagnosis present

## 2022-03-22 DIAGNOSIS — G8929 Other chronic pain: Secondary | ICD-10-CM | POA: Diagnosis present

## 2022-03-22 DIAGNOSIS — D631 Anemia in chronic kidney disease: Secondary | ICD-10-CM | POA: Diagnosis present

## 2022-03-22 DIAGNOSIS — Z7984 Long term (current) use of oral hypoglycemic drugs: Secondary | ICD-10-CM

## 2022-03-22 DIAGNOSIS — F1410 Cocaine abuse, uncomplicated: Secondary | ICD-10-CM | POA: Diagnosis not present

## 2022-03-22 DIAGNOSIS — F17210 Nicotine dependence, cigarettes, uncomplicated: Secondary | ICD-10-CM | POA: Diagnosis present

## 2022-03-22 DIAGNOSIS — J449 Chronic obstructive pulmonary disease, unspecified: Secondary | ICD-10-CM | POA: Diagnosis present

## 2022-03-22 DIAGNOSIS — K219 Gastro-esophageal reflux disease without esophagitis: Secondary | ICD-10-CM | POA: Diagnosis present

## 2022-03-22 DIAGNOSIS — Z79899 Other long term (current) drug therapy: Secondary | ICD-10-CM

## 2022-03-22 DIAGNOSIS — G20 Parkinson's disease: Secondary | ICD-10-CM | POA: Diagnosis present

## 2022-03-22 DIAGNOSIS — M542 Cervicalgia: Secondary | ICD-10-CM | POA: Diagnosis present

## 2022-03-22 DIAGNOSIS — N184 Chronic kidney disease, stage 4 (severe): Secondary | ICD-10-CM | POA: Diagnosis present

## 2022-03-22 DIAGNOSIS — F32A Depression, unspecified: Secondary | ICD-10-CM | POA: Diagnosis present

## 2022-03-22 DIAGNOSIS — I10 Essential (primary) hypertension: Secondary | ICD-10-CM | POA: Diagnosis present

## 2022-03-22 DIAGNOSIS — E041 Nontoxic single thyroid nodule: Secondary | ICD-10-CM

## 2022-03-22 DIAGNOSIS — I5033 Acute on chronic diastolic (congestive) heart failure: Secondary | ICD-10-CM | POA: Diagnosis not present

## 2022-03-22 DIAGNOSIS — Z841 Family history of disorders of kidney and ureter: Secondary | ICD-10-CM

## 2022-03-22 DIAGNOSIS — F418 Other specified anxiety disorders: Secondary | ICD-10-CM | POA: Diagnosis present

## 2022-03-22 DIAGNOSIS — Z7982 Long term (current) use of aspirin: Secondary | ICD-10-CM

## 2022-03-22 DIAGNOSIS — IMO0001 Reserved for inherently not codable concepts without codable children: Secondary | ICD-10-CM | POA: Diagnosis present

## 2022-03-22 DIAGNOSIS — Z880 Allergy status to penicillin: Secondary | ICD-10-CM

## 2022-03-22 DIAGNOSIS — I5031 Acute diastolic (congestive) heart failure: Secondary | ICD-10-CM

## 2022-03-22 DIAGNOSIS — I252 Old myocardial infarction: Secondary | ICD-10-CM

## 2022-03-22 DIAGNOSIS — Z885 Allergy status to narcotic agent status: Secondary | ICD-10-CM

## 2022-03-22 DIAGNOSIS — E1122 Type 2 diabetes mellitus with diabetic chronic kidney disease: Secondary | ICD-10-CM | POA: Diagnosis present

## 2022-03-22 DIAGNOSIS — E042 Nontoxic multinodular goiter: Secondary | ICD-10-CM | POA: Diagnosis present

## 2022-03-22 DIAGNOSIS — E8809 Other disorders of plasma-protein metabolism, not elsewhere classified: Secondary | ICD-10-CM | POA: Diagnosis present

## 2022-03-22 DIAGNOSIS — I2510 Atherosclerotic heart disease of native coronary artery without angina pectoris: Secondary | ICD-10-CM | POA: Diagnosis present

## 2022-03-22 DIAGNOSIS — Z833 Family history of diabetes mellitus: Secondary | ICD-10-CM

## 2022-03-22 DIAGNOSIS — R079 Chest pain, unspecified: Principal | ICD-10-CM

## 2022-03-22 DIAGNOSIS — E1169 Type 2 diabetes mellitus with other specified complication: Secondary | ICD-10-CM

## 2022-03-22 DIAGNOSIS — Z825 Family history of asthma and other chronic lower respiratory diseases: Secondary | ICD-10-CM

## 2022-03-22 DIAGNOSIS — E7800 Pure hypercholesterolemia, unspecified: Secondary | ICD-10-CM | POA: Diagnosis present

## 2022-03-22 DIAGNOSIS — Z7951 Long term (current) use of inhaled steroids: Secondary | ICD-10-CM

## 2022-03-22 DIAGNOSIS — Z8249 Family history of ischemic heart disease and other diseases of the circulatory system: Secondary | ICD-10-CM

## 2022-03-22 DIAGNOSIS — E6609 Other obesity due to excess calories: Secondary | ICD-10-CM

## 2022-03-22 DIAGNOSIS — Z6837 Body mass index (BMI) 37.0-37.9, adult: Secondary | ICD-10-CM

## 2022-03-22 DIAGNOSIS — E119 Type 2 diabetes mellitus without complications: Secondary | ICD-10-CM

## 2022-03-22 LAB — RAPID URINE DRUG SCREEN, HOSP PERFORMED
Amphetamines: NOT DETECTED
Barbiturates: NOT DETECTED
Benzodiazepines: NOT DETECTED
Cocaine: POSITIVE — AB
Opiates: NOT DETECTED
Tetrahydrocannabinol: NOT DETECTED

## 2022-03-22 LAB — COMPREHENSIVE METABOLIC PANEL
ALT: 18 U/L (ref 0–44)
AST: 32 U/L (ref 15–41)
Albumin: 2.5 g/dL — ABNORMAL LOW (ref 3.5–5.0)
Alkaline Phosphatase: 132 U/L — ABNORMAL HIGH (ref 38–126)
Anion gap: 11 (ref 5–15)
BUN: 33 mg/dL — ABNORMAL HIGH (ref 6–20)
CO2: 23 mmol/L (ref 22–32)
Calcium: 9 mg/dL (ref 8.9–10.3)
Chloride: 103 mmol/L (ref 98–111)
Creatinine, Ser: 2.67 mg/dL — ABNORMAL HIGH (ref 0.44–1.00)
GFR, Estimated: 20 mL/min — ABNORMAL LOW (ref >60)
Glucose, Bld: 73 mg/dL (ref 70–99)
Potassium: 3.8 mmol/L (ref 3.5–5.1)
Sodium: 137 mmol/L (ref 135–145)
Total Bilirubin: 0.7 mg/dL (ref 0.3–1.2)
Total Protein: 7.4 g/dL (ref 6.5–8.1)

## 2022-03-22 LAB — TROPONIN I (HIGH SENSITIVITY)
Troponin I (High Sensitivity): 15 ng/L (ref <18)
Troponin I (High Sensitivity): 16 ng/L (ref <18)

## 2022-03-22 LAB — CBC WITH DIFFERENTIAL/PLATELET
Abs Immature Granulocytes: 0.04 10*3/uL (ref 0.00–0.07)
Basophils Absolute: 0 10*3/uL (ref 0.0–0.1)
Basophils Relative: 0 %
Eosinophils Absolute: 0.2 10*3/uL (ref 0.0–0.5)
Eosinophils Relative: 2 %
HCT: 29.4 % — ABNORMAL LOW (ref 36.0–46.0)
Hemoglobin: 9.5 g/dL — ABNORMAL LOW (ref 12.0–15.0)
Immature Granulocytes: 0 %
Lymphocytes Relative: 11 %
Lymphs Abs: 1.2 10*3/uL (ref 0.7–4.0)
MCH: 27.4 pg (ref 26.0–34.0)
MCHC: 32.3 g/dL (ref 30.0–36.0)
MCV: 84.7 fL (ref 80.0–100.0)
Monocytes Absolute: 1.4 10*3/uL — ABNORMAL HIGH (ref 0.1–1.0)
Monocytes Relative: 14 %
Neutro Abs: 7.5 10*3/uL (ref 1.7–7.7)
Neutrophils Relative %: 73 %
Platelets: 290 10*3/uL (ref 150–400)
RBC: 3.47 MIL/uL — ABNORMAL LOW (ref 3.87–5.11)
RDW: 14.1 % (ref 11.5–15.5)
WBC: 10.3 10*3/uL (ref 4.0–10.5)
nRBC: 0 % (ref 0.0–0.2)

## 2022-03-22 LAB — CBG MONITORING, ED
Glucose-Capillary: 60 mg/dL — ABNORMAL LOW (ref 70–99)
Glucose-Capillary: 86 mg/dL (ref 70–99)

## 2022-03-22 LAB — GLUCOSE, CAPILLARY
Glucose-Capillary: 164 mg/dL — ABNORMAL HIGH (ref 70–99)
Glucose-Capillary: 133 mg/dL — ABNORMAL HIGH (ref 70–99)

## 2022-03-22 LAB — LIPASE, BLOOD: Lipase: 28 U/L (ref 11–51)

## 2022-03-22 LAB — BRAIN NATRIURETIC PEPTIDE: B Natriuretic Peptide: 1401.5 pg/mL — ABNORMAL HIGH (ref 0.0–100.0)

## 2022-03-22 MED ORDER — HYDRALAZINE HCL 25 MG PO TABS
25.0000 mg | ORAL_TABLET | Freq: Three times a day (TID) | ORAL | Status: DC
  Administered 2022-03-22 (×2): 25 mg via ORAL
  Filled 2022-03-22 (×2): qty 1

## 2022-03-22 MED ORDER — BISACODYL 5 MG PO TBEC: 5.0000 mg | DELAYED_RELEASE_TABLET | Freq: Every day | ORAL | Status: DC | PRN

## 2022-03-22 MED ORDER — POLYETHYLENE GLYCOL 3350 17 G PO PACK: 17.0000 g | PACK | Freq: Every day | ORAL | Status: DC | PRN

## 2022-03-22 MED ORDER — AMLODIPINE BESYLATE 10 MG PO TABS
10.0000 mg | ORAL_TABLET | Freq: Every morning | ORAL | Status: DC
  Administered 2022-03-22: 10 mg via ORAL
  Filled 2022-03-22: qty 2

## 2022-03-22 MED ORDER — CLONIDINE HCL 0.1 MG PO TABS: 0.1000 mg | ORAL_TABLET | ORAL | Status: DC

## 2022-03-22 MED ORDER — SERTRALINE HCL 50 MG PO TABS
50.0000 mg | ORAL_TABLET | Freq: Every evening | ORAL | Status: DC
  Administered 2022-03-22 – 2022-03-26 (×5): 50 mg via ORAL
  Filled 2022-03-22 (×5): qty 1

## 2022-03-22 MED ORDER — ACETAMINOPHEN 650 MG RE SUPP: 650.0000 mg | Freq: Four times a day (QID) | RECTAL | Status: DC | PRN

## 2022-03-22 MED ORDER — ASPIRIN 81 MG PO TBEC
81.0000 mg | DELAYED_RELEASE_TABLET | Freq: Every day | ORAL | Status: DC
  Administered 2022-03-22 – 2022-03-24 (×3): 81 mg via ORAL
  Filled 2022-03-22 (×3): qty 1

## 2022-03-22 MED ORDER — CLONIDINE HCL 0.1 MG PO TABS: 0.1000 mg | ORAL_TABLET | Freq: Every day | ORAL | Status: DC

## 2022-03-22 MED ORDER — ATORVASTATIN CALCIUM 80 MG PO TABS
80.0000 mg | ORAL_TABLET | Freq: Every day | ORAL | Status: DC
  Administered 2022-03-22 – 2022-03-27 (×6): 80 mg via ORAL
  Filled 2022-03-22 (×4): qty 1
  Filled 2022-03-22: qty 2
  Filled 2022-03-22: qty 1

## 2022-03-22 MED ORDER — LORATADINE 10 MG PO TABS
10.0000 mg | ORAL_TABLET | Freq: Every day | ORAL | Status: DC
  Administered 2022-03-22 – 2022-03-27 (×6): 10 mg via ORAL
  Filled 2022-03-22 (×6): qty 1

## 2022-03-22 MED ORDER — DOCUSATE SODIUM 100 MG PO CAPS
100.0000 mg | ORAL_CAPSULE | Freq: Two times a day (BID) | ORAL | Status: DC
  Administered 2022-03-22 – 2022-03-24 (×5): 100 mg via ORAL
  Filled 2022-03-22 (×5): qty 1

## 2022-03-22 MED ORDER — LORAZEPAM 2 MG/ML IJ SOLN
0.5000 mg | Freq: Once | INTRAMUSCULAR | Status: AC
  Administered 2022-03-22: 0.5 mg via INTRAVENOUS
  Filled 2022-03-22: qty 1

## 2022-03-22 MED ORDER — LOPERAMIDE HCL 2 MG PO CAPS: 2.0000 mg | ORAL_CAPSULE | ORAL | Status: AC | PRN

## 2022-03-22 MED ORDER — CLOPIDOGREL BISULFATE 75 MG PO TABS
75.0000 mg | ORAL_TABLET | Freq: Every morning | ORAL | Status: DC
  Administered 2022-03-22 – 2022-03-27 (×6): 75 mg via ORAL
  Filled 2022-03-22 (×6): qty 1

## 2022-03-22 MED ORDER — FUROSEMIDE 10 MG/ML IJ SOLN
40.0000 mg | Freq: Two times a day (BID) | INTRAMUSCULAR | Status: DC
  Administered 2022-03-22 – 2022-03-24 (×5): 40 mg via INTRAVENOUS
  Filled 2022-03-22 (×5): qty 4

## 2022-03-22 MED ORDER — HYDROXYZINE HCL 25 MG PO TABS: 50.0000 mg | ORAL_TABLET | Freq: Every day | ORAL | Status: DC | PRN

## 2022-03-22 MED ORDER — ACETAMINOPHEN 325 MG PO TABS
650.0000 mg | ORAL_TABLET | Freq: Four times a day (QID) | ORAL | Status: DC | PRN
  Administered 2022-03-22 – 2022-03-23 (×2): 650 mg via ORAL
  Filled 2022-03-22 (×2): qty 2

## 2022-03-22 MED ORDER — PANTOPRAZOLE SODIUM 40 MG PO TBEC
40.0000 mg | DELAYED_RELEASE_TABLET | Freq: Every morning | ORAL | Status: DC
  Administered 2022-03-22 – 2022-03-27 (×6): 40 mg via ORAL
  Filled 2022-03-22 (×6): qty 1

## 2022-03-22 MED ORDER — INSULIN ASPART 100 UNIT/ML IJ SOLN
0.0000 [IU] | Freq: Every day | INTRAMUSCULAR | Status: DC
  Administered 2022-03-24: 3 [IU] via SUBCUTANEOUS

## 2022-03-22 MED ORDER — QUETIAPINE FUMARATE ER 300 MG PO TB24
300.0000 mg | ORAL_TABLET | Freq: Every day | ORAL | Status: DC
Start: 2022-03-22 — End: 2022-03-27
  Administered 2022-03-22 – 2022-03-26 (×5): 300 mg via ORAL
  Filled 2022-03-22 (×6): qty 1

## 2022-03-22 MED ORDER — INSULIN ASPART 100 UNIT/ML IJ SOLN
0.0000 [IU] | Freq: Three times a day (TID) | INTRAMUSCULAR | Status: DC
  Administered 2022-03-23: 3 [IU] via SUBCUTANEOUS
  Administered 2022-03-23 – 2022-03-24 (×2): 5 [IU] via SUBCUTANEOUS
  Administered 2022-03-24: 3 [IU] via SUBCUTANEOUS
  Administered 2022-03-25 (×2): 5 [IU] via SUBCUTANEOUS
  Administered 2022-03-25: 8 [IU] via SUBCUTANEOUS
  Administered 2022-03-26: 5 [IU] via SUBCUTANEOUS
  Administered 2022-03-26 (×2): 3 [IU] via SUBCUTANEOUS
  Administered 2022-03-27: 2 [IU] via SUBCUTANEOUS

## 2022-03-22 MED ORDER — DICYCLOMINE HCL 20 MG PO TABS
20.0000 mg | ORAL_TABLET | Freq: Four times a day (QID) | ORAL | Status: AC | PRN
Start: 2022-03-22 — End: 2022-03-27

## 2022-03-22 MED ORDER — METHOCARBAMOL 500 MG PO TABS: 500.0000 mg | ORAL_TABLET | Freq: Three times a day (TID) | ORAL | Status: DC | PRN

## 2022-03-22 MED ORDER — ENOXAPARIN SODIUM 30 MG/0.3ML IJ SOSY
30.0000 mg | PREFILLED_SYRINGE | INTRAMUSCULAR | Status: DC
  Administered 2022-03-22 – 2022-03-26 (×5): 30 mg via SUBCUTANEOUS
  Filled 2022-03-22 (×5): qty 0.3

## 2022-03-22 MED ORDER — ONDANSETRON HCL 4 MG/2ML IJ SOLN: 4.0000 mg | Freq: Four times a day (QID) | INTRAMUSCULAR | Status: DC | PRN

## 2022-03-22 MED ORDER — BUPRENORPHINE HCL-NALOXONE HCL 2-0.5 MG SL SUBL
0.5000 | SUBLINGUAL_TABLET | Freq: Every day | SUBLINGUAL | Status: DC
  Administered 2022-03-22 – 2022-03-24 (×3): 0.5 via SUBLINGUAL
  Filled 2022-03-22 (×3): qty 1

## 2022-03-22 MED ORDER — CLONIDINE HCL 0.1 MG PO TABS
0.1000 mg | ORAL_TABLET | Freq: Four times a day (QID) | ORAL | Status: DC
  Administered 2022-03-22 (×2): 0.1 mg via ORAL
  Filled 2022-03-22 (×3): qty 1

## 2022-03-22 MED ORDER — HYDRALAZINE HCL 20 MG/ML IJ SOLN
5.0000 mg | INTRAMUSCULAR | Status: DC | PRN
  Filled 2022-03-22: qty 1

## 2022-03-22 MED ORDER — GABAPENTIN 100 MG PO CAPS
100.0000 mg | ORAL_CAPSULE | Freq: Two times a day (BID) | ORAL | Status: DC
  Administered 2022-03-22 – 2022-03-26 (×9): 100 mg via ORAL
  Filled 2022-03-22 (×9): qty 1

## 2022-03-22 MED ORDER — SODIUM CHLORIDE 0.9% FLUSH
3.0000 mL | Freq: Two times a day (BID) | INTRAVENOUS | Status: DC
  Administered 2022-03-22 – 2022-03-27 (×11): 3 mL via INTRAVENOUS

## 2022-03-22 MED ORDER — MOMETASONE FURO-FORMOTEROL FUM 200-5 MCG/ACT IN AERO
2.0000 | INHALATION_SPRAY | Freq: Two times a day (BID) | RESPIRATORY_TRACT | Status: DC
  Administered 2022-03-23 – 2022-03-27 (×9): 2 via RESPIRATORY_TRACT
  Filled 2022-03-22: qty 8.8

## 2022-03-22 MED ORDER — ISOSORBIDE MONONITRATE ER 30 MG PO TB24
30.0000 mg | ORAL_TABLET | Freq: Every day | ORAL | Status: DC
  Administered 2022-03-22 – 2022-03-24 (×3): 30 mg via ORAL
  Filled 2022-03-22 (×3): qty 1

## 2022-03-22 MED ORDER — ONDANSETRON HCL 4 MG PO TABS: 4.0000 mg | ORAL_TABLET | Freq: Four times a day (QID) | ORAL | Status: DC | PRN

## 2022-03-22 MED ORDER — TRAZODONE HCL 50 MG PO TABS
25.0000 mg | ORAL_TABLET | Freq: Every evening | ORAL | Status: DC | PRN
  Administered 2022-03-26: 25 mg via ORAL
  Filled 2022-03-22 (×2): qty 1

## 2022-03-22 MED ORDER — ALBUTEROL SULFATE (2.5 MG/3ML) 0.083% IN NEBU: 3.0000 mL | INHALATION_SOLUTION | RESPIRATORY_TRACT | Status: DC | PRN

## 2022-03-22 MED ORDER — FUROSEMIDE 10 MG/ML IJ SOLN
40.0000 mg | Freq: Once | INTRAMUSCULAR | Status: AC
  Administered 2022-03-22: 40 mg via INTRAVENOUS
  Filled 2022-03-22: qty 4

## 2022-03-22 NOTE — ED Provider Notes (Signed)
[facility] EMERGENCY DEPARTMENT Provider Note   CSN: [id number] Arrival date & time: 03/22/22  4098     History  Chief Complaint  Patient presents with   Chest Pain    Carol [name] is a 61 y.o. female.  61 years old female with medical history significant of insulin-dependent type 2 diabetes, hypertension, cocaine use, CKD stage IIIb, CAD with recent NSTEMI in May 2023 managed medically, chronic diastolic CHF, hyperlipidemia, GERD, anxiety, depression, Parkinson's disease  Presents from home with central chest pain that radiates to her right arm and bilateral shoulders for the past 2 hours.  States she was awake all night when the chest pain started and does admit to using cocaine yesterday.  Pain is associate with shortness of breath, nausea, dizziness and lightheadedness.  No fever or vomiting.  No cough.  Reports history of CAD but no stents and no MI in the past. Patient was recently admitted for CHF exacerbation and hyperglycemia.  States compliance with her medications.  States while she was hospitalized she was having pain with in her neck for the past several weeks that was not addressed.  States she could not sleep, because of the pain in her neck.  Denies any fall or trauma.  States she was trying to sleep and the chest pain started and does admit to using cocaine yesterday. No abdominal pain.  No back pain.  No focal weakness, numbness or tingling.  No cough or fever  The history is provided by the patient and the EMS personnel.  Chest Pain Associated symptoms: shortness of breath   Associated symptoms: no abdominal pain, no nausea and no vomiting        Home Medications Prior to Admission medications   Medication Sig Start Date End Date Taking? Authorizing Provider  albuterol (PROVENTIL HFA;VENTOLIN HFA) 108 (90 Base) MCG/ACT inhaler Inhale 2 puffs into the lungs every 4 (four) hours as needed for wheezing or shortness of breath.    [provider]  amLODipine (NORVASC) 10 MG tablet Take 1 tablet (10 mg total) by mouth daily. Patient taking differently: Take 10 mg by mouth every morning. 11/26/21   Varney Biles, MD  aspirin EC 81 MG EC tablet Take 1 tablet (81 mg total) by mouth daily. Swallow whole. 12/25/21   Dunn, Nedra Hai, PA-C  atorvastatin (LIPITOR) 80 MG tablet Take 1 tablet (80 mg total) by mouth daily. Patient taking differently: Take 80 mg by mouth every morning. 12/25/21   Dunn, Nedra Hai, PA-C  budesonide-formoterol (SYMBICORT) 160-4.5 MCG/ACT inhaler Inhale 2 puffs into the lungs every morning. 11/09/21   [provider]  buprenorphine-naloxone (SUBOXONE) 2-0.5 mg SUBL SL tablet Place 0.5 tablets under the tongue daily. 03/05/22   [provider]  cetirizine (ZYRTEC) 10 MG tablet Take 10 mg by mouth every morning.    [provider]  clopidogrel (PLAVIX) 75 MG tablet Take 1 tablet (75 mg total) by mouth daily. Patient taking differently: Take 75 mg by mouth every morning. 12/25/21   Dunn, Nedra Hai, PA-C  ergocalciferol (VITAMIN D2) 1.25 MG (50000 UT) capsule Take 50,000 Units by mouth once a week. Wednesday    [provider]  gabapentin (NEURONTIN) 100 MG capsule Take 100 mg by mouth 2 (two) times daily.    [provider]  glucose blood (FREESTYLE TEST STRIPS) test strip 1 each by Other route as needed for other. 01/03/22   Shelly Coss, MD  hydrALAZINE (APRESOLINE) 25 MG tablet Take  25 mg by mouth 3 (three) times daily. 03/08/22   [provider]  hydrOXYzine (ATARAX) 50 MG tablet Take 50 mg by mouth daily as needed for anxiety or itching.    [provider]  insulin glargine (LANTUS) 100 UNIT/ML Solostar Pen Inject 20 Units into the skin at bedtime. 03/17/22   Pokhrel, Corrie Mckusick, MD  insulin lispro (HUMALOG) 100 UNIT/ML KwikPen Inject 10 Units into the skin 3 (three) times daily after meals. 02/04/22   Little Ishikawa, MD  Magnesium 400 MG CAPS Take 1 capsule  by mouth daily. 03/17/22 04/16/22  Pokhrel, Corrie Mckusick, MD  metFORMIN (GLUCOPHAGE) 500 MG tablet Take 500 mg by mouth 2 (two) times daily.    [provider]  Nebivolol HCl 20 MG TABS Take 20 mg by mouth every morning. 12/07/21   [provider]  nitroGLYCERIN (NITROSTAT) 0.4 MG SL tablet Place 0.4 mg under the tongue every 5 (five) minutes as needed for chest pain.    [provider]  olmesartan-hydrochlorothiazide (BENICAR HCT) 40-25 MG tablet Take 1 tablet by mouth daily. 03/08/22   [provider]  oxyCODONE-acetaminophen (PERCOCET/ROXICET) 5-325 MG tablet Take 1-2 tablets by mouth every 6 (six) hours as needed for severe pain. 08/31/15   Cartner, Marland Kitchen, PA-C  pantoprazole (PROTONIX) 40 MG tablet Take 1 tablet (40 mg total) by mouth daily at 12 noon. Patient taking differently: Take 40 mg by mouth every morning. 02/02/14   Regalado, Belkys A, MD  QUEtiapine (SEROQUEL XR) 300 MG 24 hr tablet Take 300 mg by mouth at bedtime. 12/07/21   [provider]  sertraline (ZOLOFT) 50 MG tablet Take 50 mg by mouth every evening.    [provider]  torsemide (DEMADEX) 20 MG tablet Take 1 tablet (20 mg total) by mouth daily. 03/17/22   Pokhrel, Corrie Mckusick, MD  triamcinolone ointment (KENALOG) 0.1 % Apply 1 application. topically 2 (two) times daily.    [provider]      Allergies    Penicillins and Tramadol    Review of Systems   Review of Systems  Respiratory:  Positive for chest tightness and shortness of breath.   Cardiovascular:  Positive for chest pain.  Gastrointestinal:  Negative for abdominal pain, nausea and vomiting.  Musculoskeletal:  Positive for arthralgias, myalgias and neck pain.  Skin:  Negative for rash.   all other systems are negative except as noted in the HPI and PMH.    Physical Exam Updated Vital Signs BP (!) 150/83 (BP Location: Right Arm)   Pulse 74   Temp 97.9 F (36.6 C) (Oral)   Resp 16   Ht 5\' 4"  (1.626 m)    Wt 98.9 kg   SpO2 97%   BMI 37.43 kg/m  Physical Exam Vitals and nursing note reviewed.  Constitutional:      General: She is not in acute distress.    Appearance: She is well-developed.     Comments: Uncomfortable.  HENT:     Head: Normocephalic and atraumatic.     Mouth/Throat:     Pharynx: No oropharyngeal exudate.  Eyes:     Conjunctiva/sclera: Conjunctivae normal.     Pupils: Pupils are equal, round, and reactive to light.  Neck:     Comments: No meningismus. Cardiovascular:     Rate and Rhythm: Normal rate and regular rhythm.     Heart sounds: Normal heart sounds. No murmur heard. Pulmonary:     Effort: Pulmonary effort is normal. No respiratory distress.  Breath sounds: Normal breath sounds.     Comments: Reproducible chest tenderness on exam Chest:     Chest wall: Tenderness present.  Abdominal:     Palpations: Abdomen is soft.     Tenderness: There is no abdominal tenderness. There is no guarding or rebound.  Musculoskeletal:        General: No tenderness. Normal range of motion.     Cervical back: Normal range of motion and neck supple.     Right lower leg: Edema present.     Left lower leg: Edema present.     Comments: Equal radial pulses and grip strength bilaterally  Intact DP and PT pulses bilaterally  Skin:    General: Skin is warm.  Neurological:     Mental Status: She is alert and oriented to person, place, and time.     Cranial Nerves: No cranial nerve deficit.     Motor: No abnormal muscle tone.     Coordination: Coordination normal.     Comments:  5/5 strength throughout. CN 2-12 intact.Equal grip strength.   Psychiatric:        Behavior: Behavior normal.     ED Results / Procedures / Treatments   Labs (all labs ordered are listed, but only abnormal results are displayed) Labs Reviewed  CBC WITH DIFFERENTIAL/PLATELET - Abnormal; Notable for the following components:      Result Value   RBC 3.47 (*)    Hemoglobin 9.5 (*)    HCT 29.4  (*)    Monocytes Absolute 1.4 (*)    All other components within normal limits  COMPREHENSIVE METABOLIC PANEL - Abnormal; Notable for the following components:   BUN 33 (*)    Creatinine, Ser 2.67 (*)    Albumin 2.5 (*)    Alkaline Phosphatase 132 (*)    GFR, Estimated 20 (*)    All other components within normal limits  BRAIN NATRIURETIC PEPTIDE - Abnormal; Notable for the following components:   B Natriuretic Peptide 1,401.5 (*)    All other components within normal limits  LIPASE, BLOOD  RAPID URINE DRUG SCREEN, HOSP PERFORMED  TROPONIN I (HIGH SENSITIVITY)  TROPONIN I (HIGH SENSITIVITY)    EKG EKG Interpretation  Date/Time:  Thursday March 22 2022 06:45:34 EDT Ventricular Rate:  76 PR Interval:  142 QRS Duration: 99 QT Interval:  442 QTC Calculation: 497 R Axis:   28 Text Interpretation: Sinus rhythm Ventricular premature complex Probable left atrial enlargement Borderline prolonged QT interval No significant change was found Confirmed by Ezequiel Essex [phone]) on 03/22/2022 7:01:29 AM  Radiology CT Cervical Spine Wo Contrast  Result Date: 03/22/2022 CLINICAL DATA:  Neck and right arm pain. EXAM: CT CERVICAL SPINE WITHOUT CONTRAST TECHNIQUE: Multidetector CT imaging of the cervical spine was performed without intravenous contrast. Multiplanar CT image reconstructions were also generated. RADIATION DOSE REDUCTION: This exam was performed according to the departmental dose-optimization program which includes automated exposure control, adjustment of the mA and/or kV according to patient size and/or use of iterative reconstruction technique. COMPARISON:  None Available. FINDINGS: Alignment: Normal Skull base and vertebrae: No acute fracture. No primary bone lesion or focal pathologic process. Soft tissues and spinal canal: No prevertebral fluid or swelling. No visible canal hematoma. Disc levels: The spinal canal is fairly generous. No large cervical disc protrusions, significant  spinal or foraminal stenosis. MRI may be helpful if symptoms persist or worsen. Upper chest: The visualized lung apices are grossly clear. Other: Bilateral thyroid lesions are noted. The largest  lesion in the left lobe measures 3.5 cm. Recommend thyroid US and consideration for biopsy (ref: J Am Coll Radiol. 2015 Feb;12(2): 143-50). Bilateral carotid artery calcifications are noted. IMPRESSION: 1. Normal alignment and no acute bony findings. 2. No large cervical disc protrusions, significant spinal or foraminal stenosis. MRI may be helpful if symptoms persist or worsen. 3. Bilateral thyroid lesions. The largest lesion in the left lobe measures 3.5 cm. Recommend thyroid ultrasound examination. Electronically Signed   By: Marijo Sanes M.D.   On: 03/22/2022 11:03   DG Chest Portable 1 View  Result Date: 03/22/2022 CLINICAL DATA:  Chest pain EXAM: PORTABLE CHEST 1 VIEW COMPARISON:  03/14/2022 FINDINGS: Mild cardiac enlargement, unchanged. No pleural effusion or edema. Pulmonary vascular congestion. No airspace disease. Unchanged appearance of right posterior seventh rib deformity. No acute osseous findings. IMPRESSION: Cardiac enlargement and pulmonary vascular congestion. Electronically Signed   By: Kerby Moors M.D.   On: 03/22/2022 08:04    Procedures Procedures    Medications Ordered in ED Medications  LORazepam (ATIVAN) injection 0.5 mg (has no administration in time range)   LHC: May 2023    Inf Sept lesion is 100% stenosed.   Mid RCA lesion is 20% stenosed.   Mid LAD lesion is 20% stenosed.   NSTEMI with culprit vessel felt to be a very small sub-branch off of the right PDA. This is a 1.5 mm vessel and supplies a small portion of myocardium.  Mild disease in the mid LAD and mid RCA ED Course/ Medical Decision Making/ A&P                           Medical Decision Making Amount and/or Complexity of Data Reviewed Labs: ordered. Decision-making details documented in ED  Course. Radiology: ordered and independent interpretation performed. Decision-making details documented in ED Course. ECG/medicine tests: ordered and independent interpretation performed. Decision-making details documented in ED Course.  Risk Prescription drug management. Decision regarding hospitalization.  Central chest pain with shortness of breath, nausea and dizziness in setting of cocaine use.  She appears uncomfortable.  EKG shows no acute ST elevation.  Patient received aspirin nitroglycerin without much effect.  Will give Ativan.  Check labs with chest x-ray  R arm 152/86 L arm 145/71  Chest pain improving after Ativan.  First troponin is negative.  Patient appears to have volume overload again with edema on x-ray and elevated BNP.  Creatinine improving from previous down to 2.67. Unable to give IV contrast.  Equal upper extremity blood pressures.  Patient has had ongoing back and neck pain for quite some time.  Lower suspicion for aortic dissection or pulmonary embolism.  Concern for cocaine induced vasospasm as well as CHF exacerbation.  Chest pain has improved after multiple doses of Ativan.  Suspect likely cocaine induced vasospasm.  Low suspicion for ACS, troponin is flat.  Chest x-ray concerning for CHF exacerbation and she does feel short of breath but has no hypoxia or increased work of breathing.  She is given IV Lasix.  Creatinine improved from baseline.  Aortic dissection considered less likely at this time given resolution of her chest pain with Ativan, equal upper extremity blood pressures, pulses and grip strengths. She patient is complaining of neck pain which has been ongoing for several weeks without trauma.  CT scan was reassuring but showed bilateral thyroid nodules that need further work-up.  With CHF exacerbation ongoing chest pain will plan observation admission for continued diuresis.  Discussed with Dr. Lorin Mercy.        Final Clinical Impression(s) / ED  Diagnoses Final diagnoses:  Nonspecific chest pain  Cocaine abuse (HCC)  Acute diastolic congestive heart failure [facility])    Rx / DC Orders ED Discharge Orders     None         Shloima Clinch, Annie Main, MD 03/22/22 1222

## 2022-03-22 NOTE — ED Notes (Signed)
ED TO INPATIENT HANDOFF REPORT  ED Nurse Name and Phone #: Raquel Sarna RN [phone]  S Name/Age/Gender Carol [name] 61 y.o. female Room/Bed: 032C/032C  Code Status   Code Status: Full Code  Home/SNF/Other Home Patient oriented to: self, place, time, and situation Is this baseline? Yes   Triage Complete: Triage complete  Chief Complaint Acute on chronic diastolic (congestive) heart failure ([place]) [I50.33]  Triage Note BIB EMS, pt reports chest pain x2 hours that radiates down right arm. Reports shortness of breath, dizziness, and nausea.    Allergies Allergies  Allergen Reactions   Penicillins Shortness Of Breath and Other (See Comments)    Caused yeast infection Has patient had a PCN reaction causing immediate rash, facial/tongue/throat swelling, SOB or lightheadedness with hypotension: Yes Has patient had a PCN reaction causing severe rash involving mucus membranes or skin necrosis: No Has patient had a PCN reaction that required hospitalization pt was in the hospital at the time of the reaction Has patient had a PCN reaction occurring within the last 10 years: No If all of the above answers are "NO", then may proceed with Cephalosp   Ultram [Tramadol] Other (See Comments)    Made her tongue raw    Level of Care/Admitting Diagnosis ED Disposition     ED Disposition  Admit   Condition  --   [place]: [facility] [[id number]]  Level of Care: Telemetry Cardiac [[id number]]  May place patient in observation at [facility] or [facility] if equivalent level of care is available:: No  Covid Evaluation: Asymptomatic - no recent exposure (last 10 days) testing not required  Diagnosis: Acute on chronic diastolic (congestive) heart failure [facility]) [[id number]]  Admitting Physician: Karmen Bongo [[id number]]  Attending Physician: Karmen Bongo [[id number]]          B Medical/Surgery History Past Medical History:  Diagnosis Date   Abnormal liver  function tests    Acute kidney injury ([facility]) 01/2014   Hospitalized, Volume depletion, nausea and vomiting   Anxiety    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   Chronic back pain    Depression    Diabetes mellitus    GERD (gastroesophageal reflux disease)    Heart murmur    Hypertension    NSTEMI (non-ST elevated myocardial infarction) ([place]) 12/20/2021   Palpitations    Parkinson's disease ([place])    Past Surgical History:  Procedure Laterality Date   CARPAL TUNNEL RELEASE     IR FLUORO GUIDE CV LINE RIGHT  01/26/2022   IR US GUIDE VASC ACCESS RIGHT  01/26/2022   LEFT HEART CATH AND CORONARY ANGIOGRAPHY N/A 12/20/2021   Procedure: LEFT HEART CATH AND CORONARY ANGIOGRAPHY;  Surgeon: Burnell Blanks, MD;  Location: [facility];  Service: Cardiovascular;  Laterality: N/A;   OPEN REDUCTION INTERNAL FIXATION (ORIF) FINGER WITH RADIAL BONE GRAFT Right 09/12/2015   Procedure: RIGHT MIDDLE FINGER CLOSED REDUCTION AND PINNING;  Surgeon: Iran Planas, MD;  Location: [facility];  Service: Orthopedics;  Laterality: Right;   TONSILLECTOMY     TUBAL LIGATION       A IV Location/Drains/Wounds Patient Lines/Drains/Airways Status     Active Line/Drains/Airways     Name Placement date Placement time Site Days   Peripheral IV 03/22/22 22 G Anterior;Right Forearm 03/22/22  0806  Forearm  less than 1   External Urinary Catheter 03/22/22  0813  --  less than 1   Incision (Closed) 09/12/15 Hand Right 09/12/15  1749  --  2383   Wound / Incision (Open or Dehisced) 02/01/22 Other (Comment) Arm Anterior;Left Odd IV insertion site 02/01/22  1024  Arm  49            Intake/Output Last 24 hours  Intake/Output Summary (Last 24 hours) at 03/22/2022 1430 Last data filed at 03/22/2022 1426 Gross per 24 hour  Intake --  Output 1200 ml  Net -1200 ml    Labs/Imaging Results for orders placed or performed during the hospital encounter of 03/22/22 (from the past 48 hour(s))  CBC with  Differential     Status: Abnormal   Collection Time: 03/22/22  7:12 AM  Result Value Ref Range   WBC 10.3 4.0 - 10.5 K/uL   RBC 3.47 (L) 3.87 - 5.11 MIL/uL   Hemoglobin 9.5 (L) 12.0 - 15.0 g/dL   HCT 29.4 (L) 36.0 - 46.0 %   MCV 84.7 80.0 - 100.0 fL   MCH 27.4 26.0 - 34.0 pg   MCHC 32.3 30.0 - 36.0 g/dL   RDW 14.1 11.5 - 15.5 %   Platelets 290 150 - 400 K/uL   nRBC 0.0 0.0 - 0.2 %   Neutrophils Relative % 73 %   Neutro Abs 7.5 1.7 - 7.7 K/uL   Lymphocytes Relative 11 %   Lymphs Abs 1.2 0.7 - 4.0 K/uL   Monocytes Relative 14 %   Monocytes Absolute 1.4 (H) 0.1 - 1.0 K/uL   Eosinophils Relative 2 %   Eosinophils Absolute 0.2 0.0 - 0.5 K/uL   Basophils Relative 0 %   Basophils Absolute 0.0 0.0 - 0.1 K/uL   Immature Granulocytes 0 %   Abs Immature Granulocytes 0.04 0.00 - 0.07 K/uL    Comment: Performed at [facility], [address], [place], [place] [address]  Comprehensive metabolic panel     Status: Abnormal   Collection Time: 03/22/22  7:12 AM  Result Value Ref Range   Sodium 137 135 - 145 mmol/L   Potassium 3.8 3.5 - 5.1 mmol/L   Chloride 103 98 - 111 mmol/L   CO2 23 22 - 32 mmol/L   Glucose, Bld 73 70 - 99 mg/dL    Comment: Glucose reference range applies only to samples taken after fasting for at least 8 hours.   BUN 33 (H) 6 - 20 mg/dL   Creatinine, Ser 2.67 (H) 0.44 - 1.00 mg/dL   Calcium 9.0 8.9 - 10.3 mg/dL   Total Protein 7.4 6.5 - 8.1 g/dL   Albumin 2.5 (L) 3.5 - 5.0 g/dL   AST 32 15 - 41 U/L   ALT 18 0 - 44 U/L   Alkaline Phosphatase 132 (H) 38 - 126 U/L   Total Bilirubin 0.7 0.3 - 1.2 mg/dL   GFR, Estimated 20 (L) >60 mL/min    Comment: (NOTE) Calculated using the CKD-EPI Creatinine Equation (2021)    Anion gap 11 5 - 15    Comment: Performed at [facility], [place] [address] [place], [place] [address]  Lipase, blood     Status: None   Collection Time: 03/22/22  7:12 AM  Result Value Ref Range   Lipase 28 11 - 51 U/L    Comment: Performed  at [facility] [address], [place], [place] [address]  Troponin I (High Sensitivity)     Status: None   Collection Time: 03/22/22  7:12 AM  Result Value Ref Range   Troponin I (High Sensitivity) 15 <18 ng/L  Comment: (NOTE) Elevated high sensitivity troponin I (hsTnI) values and significant  changes across serial measurements may suggest ACS but many other  chronic and acute conditions are known to elevate hsTnI results.  Refer to the "Links" section for chest pain algorithms and additional  guidance. Performed at [facility], [place] [address], [place], [place] [address]   Brain natriuretic peptide     Status: Abnormal   Collection Time: 03/22/22  7:12 AM  Result Value Ref Range   B Natriuretic Peptide 1,401.5 (H) 0.0 - 100.0 pg/mL    Comment: Performed at [facility] [address], [place], [place] [address]  Troponin I (High Sensitivity)     Status: None   Collection Time: 03/22/22  9:21 AM  Result Value Ref Range   Troponin I (High Sensitivity) 16 <18 ng/L    Comment: (NOTE) Elevated high sensitivity troponin I (hsTnI) values and significant  changes across serial measurements may suggest ACS but many other  chronic and acute conditions are known to elevate hsTnI results.  Refer to the "Links" section for chest pain algorithms and additional  guidance. Performed at [facility], [place] [address] [place], [place] [address]   Rapid urine drug screen (hospital performed)     Status: Abnormal   Collection Time: 03/22/22 12:37 PM  Result Value Ref Range   Opiates NONE DETECTED NONE DETECTED   Cocaine POSITIVE (A) NONE DETECTED   Benzodiazepines NONE DETECTED NONE DETECTED   Amphetamines NONE DETECTED NONE DETECTED   Tetrahydrocannabinol NONE DETECTED NONE DETECTED   Barbiturates NONE DETECTED NONE DETECTED    Comment: (NOTE) DRUG SCREEN FOR MEDICAL PURPOSES ONLY.  IF CONFIRMATION IS NEEDED FOR ANY PURPOSE, NOTIFY LAB WITHIN 5 DAYS.  LOWEST  DETECTABLE LIMITS FOR URINE DRUG SCREEN Drug Class                     Cutoff (ng/mL) Amphetamine and metabolites    1000 Barbiturate and metabolites    200 Benzodiazepine                 449 Tricyclics and metabolites     300 Opiates and metabolites        300 Cocaine and metabolites        300 THC                            50 Performed at [facility], [place] [address] [place], [place] [address]   CBG monitoring, ED     Status: Abnormal   Collection Time: 03/22/22  2:24 PM  Result Value Ref Range   Glucose-Capillary 60 (L) 70 - 99 mg/dL    Comment: Glucose reference range applies only to samples taken after fasting for at least 8 hours.   CT Cervical Spine Wo Contrast  Result Date: 03/22/2022 CLINICAL DATA:  Neck and right arm pain. EXAM: CT CERVICAL SPINE WITHOUT CONTRAST TECHNIQUE: Multidetector CT imaging of the cervical spine was performed without intravenous contrast. Multiplanar CT image reconstructions were also generated. RADIATION DOSE REDUCTION: This exam was performed according to the departmental dose-optimization program which includes automated exposure control, adjustment of the mA and/or kV according to patient size and/or use of iterative reconstruction technique. COMPARISON:  None Available. FINDINGS: Alignment: Normal Skull base and vertebrae: No acute fracture. No primary bone lesion or focal pathologic process. Soft tissues and spinal canal: No prevertebral fluid or swelling.  No visible canal hematoma. Disc levels: The spinal canal is fairly generous. No large cervical disc protrusions, significant spinal or foraminal stenosis. MRI may be helpful if symptoms persist or worsen. Upper chest: The visualized lung apices are grossly clear. Other: Bilateral thyroid lesions are noted. The largest lesion in the left lobe measures 3.5 cm. Recommend thyroid US and consideration for biopsy (ref: J Am Coll Radiol. 2015 Feb;12(2): 143-50). Bilateral carotid artery calcifications  are noted. IMPRESSION: 1. Normal alignment and no acute bony findings. 2. No large cervical disc protrusions, significant spinal or foraminal stenosis. MRI may be helpful if symptoms persist or worsen. 3. Bilateral thyroid lesions. The largest lesion in the left lobe measures 3.5 cm. Recommend thyroid ultrasound examination. Electronically Signed   By: Marijo Sanes M.D.   On: 03/22/2022 11:03   DG Chest Portable 1 View  Result Date: 03/22/2022 CLINICAL DATA:  Chest pain EXAM: PORTABLE CHEST 1 VIEW COMPARISON:  03/14/2022 FINDINGS: Mild cardiac enlargement, unchanged. No pleural effusion or edema. Pulmonary vascular congestion. No airspace disease. Unchanged appearance of right posterior seventh rib deformity. No acute osseous findings. IMPRESSION: Cardiac enlargement and pulmonary vascular congestion. Electronically Signed   By: Kerby Moors M.D.   On: 03/22/2022 08:04    Pending Labs Unresulted Labs (From admission, onward)    None       Vitals/Pain Today's Vitals   03/22/22 1300 03/22/22 1330 03/22/22 1400 03/22/22 1418  BP: (!) 163/78 (!) 153/86 (!) 158/82   Pulse: 75 75 74   Resp: 19 (!) 24 14   Temp:    98.4 F (36.9 C)  TempSrc:    Oral  SpO2: 96% 95% 95%   Weight:      Height:      PainSc:        Isolation Precautions No active isolations  Medications Medications  aspirin EC tablet 81 mg (has no administration in time range)  buprenorphine-naloxone (SUBOXONE) 2-0.5 mg per SL tablet 0.5 tablet (has no administration in time range)  amLODipine (NORVASC) tablet 10 mg (has no administration in time range)  atorvastatin (LIPITOR) tablet 80 mg (has no administration in time range)  hydrALAZINE (APRESOLINE) tablet 25 mg (has no administration in time range)  hydrOXYzine (ATARAX) tablet 50 mg (has no administration in time range)  QUEtiapine (SEROQUEL XR) 24 hr tablet 300 mg (has no administration in time range)  sertraline (ZOLOFT) tablet 50 mg (has no administration in  time range)  pantoprazole (PROTONIX) EC tablet 40 mg (has no administration in time range)  clopidogrel (PLAVIX) tablet 75 mg (has no administration in time range)  gabapentin (NEURONTIN) capsule 100 mg (has no administration in time range)  albuterol (PROVENTIL) (2.5 MG/3ML) 0.083% nebulizer solution 3 mL (has no administration in time range)  mometasone-formoterol (DULERA) 200-5 MCG/ACT inhaler 2 puff (has no administration in time range)  loratadine (CLARITIN) tablet 10 mg (has no administration in time range)  dicyclomine (BENTYL) tablet 20 mg (has no administration in time range)  loperamide (IMODIUM) capsule 2-4 mg (has no administration in time range)  methocarbamol (ROBAXIN) tablet 500 mg (has no administration in time range)  furosemide (LASIX) injection 40 mg (has no administration in time range)  sodium chloride flush (NS) 0.9 % injection 3 mL (has no administration in time range)  acetaminophen (TYLENOL) tablet 650 mg (has no administration in time range)    Or  acetaminophen (TYLENOL) suppository 650 mg (has no administration in time range)  traZODone (DESYREL) tablet 25 mg (has no  administration in time range)  docusate sodium (COLACE) capsule 100 mg (has no administration in time range)  polyethylene glycol (MIRALAX / GLYCOLAX) packet 17 g (has no administration in time range)  bisacodyl (DULCOLAX) EC tablet 5 mg (has no administration in time range)  ondansetron (ZOFRAN) tablet 4 mg (has no administration in time range)    Or  ondansetron (ZOFRAN) injection 4 mg (has no administration in time range)  hydrALAZINE (APRESOLINE) injection 5 mg (has no administration in time range)  cloNIDine (CATAPRES) tablet 0.1 mg (has no administration in time range)    Followed by  cloNIDine (CATAPRES) tablet 0.1 mg (has no administration in time range)    Followed by  cloNIDine (CATAPRES) tablet 0.1 mg (has no administration in time range)  insulin aspart (novoLOG) injection 0-15 Units  (has no administration in time range)  insulin aspart (novoLOG) injection 0-5 Units (has no administration in time range)  enoxaparin (LOVENOX) injection 30 mg (has no administration in time range)  isosorbide mononitrate (IMDUR) 24 hr tablet 30 mg (has no administration in time range)  LORazepam (ATIVAN) injection 0.5 mg (0.5 mg Intravenous Given 03/22/22 0807)  LORazepam (ATIVAN) injection 0.5 mg (0.5 mg Intravenous Given 03/22/22 1005)  furosemide (LASIX) injection 40 mg (40 mg Intravenous Given 03/22/22 1005)    Mobility walks Low fall risk   Focused Assessments Cardiac Assessment Handoff:  Cardiac Rhythm: Normal sinus rhythm Lab Results  Component Value Date   CKTOTAL 140 04/26/2018   CKMB 1.3 02/23/2008   TROPONINI <0.01        NO INDICATION OF MYOCARDIAL INJURY. 02/23/2008   Lab Results  Component Value Date   DDIMER 0.32 07/03/2011   Does the Patient currently have chest pain? No    R Recommendations: See Admitting Provider Note  Report given to:   Additional Notes: Positive for cocaine, last use last night. Pt is on a purewick.

## 2022-03-22 NOTE — ED Notes (Signed)
Pt given OJ w/ sugar and graham cracker w/ peanut butter

## 2022-03-22 NOTE — ED Triage Notes (Signed)
BIB EMS, pt reports chest pain x2 hours that radiates down right arm. Reports shortness of breath, dizziness, and nausea.

## 2022-03-22 NOTE — H&P (Addendum)
History and Physical    Patient: Carol [name] HBZ:[id number] DOB: 1960/09/06 DOA: 03/22/2022 DOS: the patient was seen and examined on 03/22/2022 PCP: Simona Huh, NP  Patient coming from: Home - lives with daughter and 2 grandchildren; NOK: Daughter, Nasha Diss, [phone]   Chief Complaint: Chest pain  HPI: Carol [name] is a 61 y.o. female with medical history significant of DM, HTN, cocaine abuse, stage 4 CKD, Parkinson's, and CAD presenting with chest pain.  She was last admitted from 7/22-29 for hypoglycemia and AMS.  Prior admission in June and this one were also related to acute on chronic diastolic CHF in the setting of cocaine use.  She reports that since she wasn't given medication for her neck pain, she used cocaine to deal with the pain.  She does have a remote h/o polysubstance abuse and was clean for 20 years until she started using cocaine again periodically in April.  Her neck pain continues but she awoke overnight with severe chest pain and SOB.  She has had ongoing LE edema.  She also has had some DOE recently.  She was given Lasix in the ER and the chest discomfort is improving.  She is on RA currently.    ER Course:  CHF exacerbation and CP, likely from cocaine use.  Cocaine last night, chest pain and SOB this AM.  Neck pain, scan negative.  EKG unremarkable.  Troponin 15.  Improving creatinine.  BNP 1400, CXR with edema.  Not on O2.     Review of Systems: As mentioned in the history of present illness. All other systems reviewed and are negative. 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   Chronic back pain    Depression    Diabetes mellitus    GERD (gastroesophageal reflux disease)    Heart murmur    Hypertension    NSTEMI (non-ST elevated myocardial  infarction) ([place]) 12/20/2021   Palpitations    Parkinson's disease ([place])    Past Surgical History:  Procedure Laterality Date   CARPAL TUNNEL RELEASE     IR FLUORO GUIDE CV LINE RIGHT  01/26/2022   IR US GUIDE VASC ACCESS RIGHT  01/26/2022   LEFT HEART CATH AND CORONARY ANGIOGRAPHY N/A 12/20/2021   Procedure: LEFT HEART CATH AND CORONARY ANGIOGRAPHY;  Surgeon: Burnell Blanks, MD;  Location: [facility];  Service: Cardiovascular;  Laterality: N/A;   OPEN REDUCTION INTERNAL FIXATION (ORIF) FINGER WITH RADIAL BONE GRAFT Right 09/12/2015   Procedure: RIGHT MIDDLE FINGER CLOSED REDUCTION AND PINNING;  Surgeon: Iran Planas, MD;  Location: [facility];  Service: Orthopedics;  Laterality: Right;   TONSILLECTOMY     TUBAL LIGATION     Social History:  reports that she has been smoking cigarettes. She has a 0.75 pack-year smoking history. She has never used smokeless tobacco. She reports current alcohol use. She reports that she does not use drugs.  Allergies  Allergen Reactions   Penicillins Shortness Of Breath and Other (See Comments)    Caused yeast infection Has patient had a PCN reaction causing immediate rash, facial/tongue/throat swelling, SOB or lightheadedness with hypotension: Yes Has patient had a PCN reaction causing severe rash involving mucus membranes or skin necrosis: No Has patient had a PCN reaction that required hospitalization pt was in the hospital at  the time of the reaction Has patient had a PCN reaction occurring within the last 10 years: No If all of the above answers are "NO", then may proceed with Cephalosp   Ultram [Tramadol] Other (See Comments)    Made her tongue raw    Family History  Problem Relation Age of Onset   Diabetes Mother    Kidney disease Mother    Seizures Father    Hypertension Father    Stroke Father    Asthma Other     Prior to Admission medications   Medication Sig Start Date End Date Taking? Authorizing Provider  albuterol (PROVENTIL  HFA;VENTOLIN HFA) 108 (90 Base) MCG/ACT inhaler Inhale 2 puffs into the lungs every 4 (four) hours as needed for wheezing or shortness of breath.    [provider]  amLODipine (NORVASC) 10 MG tablet Take 1 tablet (10 mg total) by mouth daily. Patient taking differently: Take 10 mg by mouth every morning. 11/26/21   Varney Biles, MD  aspirin EC 81 MG EC tablet Take 1 tablet (81 mg total) by mouth daily. Swallow whole. 12/25/21   Dunn, Nedra Hai, PA-C  atorvastatin (LIPITOR) 80 MG tablet Take 1 tablet (80 mg total) by mouth daily. Patient taking differently: Take 80 mg by mouth every morning. 12/25/21   Dunn, Nedra Hai, PA-C  budesonide-formoterol (SYMBICORT) 160-4.5 MCG/ACT inhaler Inhale 2 puffs into the lungs every morning. 11/09/21   [provider]  buprenorphine-naloxone (SUBOXONE) 2-0.5 mg SUBL SL tablet Place 0.5 tablets under the tongue daily. 03/05/22   [provider]  cetirizine (ZYRTEC) 10 MG tablet Take 10 mg by mouth every morning.    [provider]  clopidogrel (PLAVIX) 75 MG tablet Take 1 tablet (75 mg total) by mouth daily. Patient taking differently: Take 75 mg by mouth every morning. 12/25/21   Dunn, Nedra Hai, PA-C  ergocalciferol (VITAMIN D2) 1.25 MG (50000 UT) capsule Take 50,000 Units by mouth once a week. Wednesday    [provider]  gabapentin (NEURONTIN) 100 MG capsule Take 100 mg by mouth 2 (two) times daily.    [provider]  glucose blood (FREESTYLE TEST STRIPS) test strip 1 each by Other route as needed for other. 01/03/22   Shelly Coss, MD  hydrALAZINE (APRESOLINE) 25 MG tablet Take 25 mg by mouth 3 (three) times daily. 03/08/22   [provider]  hydrOXYzine (ATARAX) 50 MG tablet Take 50 mg by mouth daily as needed for anxiety or itching.    [provider]  insulin glargine (LANTUS) 100 UNIT/ML Solostar Pen Inject 20 Units into the skin at bedtime. 03/17/22   Pokhrel, Corrie Mckusick, MD  insulin lispro (HUMALOG)  100 UNIT/ML KwikPen Inject 10 Units into the skin 3 (three) times daily after meals. 02/04/22   Little Ishikawa, MD  Magnesium 400 MG CAPS Take 1 capsule by mouth daily. 03/17/22 04/16/22  Pokhrel, Corrie Mckusick, MD  metFORMIN (GLUCOPHAGE) 500 MG tablet Take 500 mg by mouth 2 (two) times daily.    [provider]  Nebivolol HCl 20 MG TABS Take 20 mg by mouth every morning. 12/07/21   [provider]  nitroGLYCERIN (NITROSTAT) 0.4 MG SL tablet Place 0.4 mg under the tongue every 5 (five) minutes as needed for chest pain.    [provider]  olmesartan-hydrochlorothiazide (BENICAR HCT) 40-25 MG tablet Take 1 tablet by mouth daily. 03/08/22   [provider]  oxyCODONE-acetaminophen (PERCOCET/ROXICET) 5-325 MG tablet Take 1-2 tablets by mouth every 6 (six)  hours as needed for severe pain. 08/31/15   Cartner, Marland Kitchen, PA-C  pantoprazole (PROTONIX) 40 MG tablet Take 1 tablet (40 mg total) by mouth daily at 12 noon. Patient taking differently: Take 40 mg by mouth every morning. 02/02/14   Regalado, Belkys A, MD  QUEtiapine (SEROQUEL XR) 300 MG 24 hr tablet Take 300 mg by mouth at bedtime. 12/07/21   [provider]  sertraline (ZOLOFT) 50 MG tablet Take 50 mg by mouth every evening.    [provider]  torsemide (DEMADEX) 20 MG tablet Take 1 tablet (20 mg total) by mouth daily. 03/17/22   Pokhrel, Corrie Mckusick, MD  triamcinolone ointment (KENALOG) 0.1 % Apply 1 application. topically 2 (two) times daily.    [provider]    Physical Exam: Vitals:   03/22/22 0742 03/22/22 0745 03/22/22 1000 03/22/22 1242  BP: (S) (!) 152/86 (S) (!) 145/70 (!) 140/80   Pulse: 73  74   Resp: 15  (!) 21   Temp: 98.6 F (37 C)   98.5 F (36.9 C)  TempSrc: Oral   Oral  SpO2: 99%  97%   Weight:      Height:       General:  Appears calm and comfortable and is in NAD Eyes:  EOMI, normal lids, iris ENT:  grossly normal hearing, lips & tongue, mmm Neck:  no LAD, masses  or thyromegaly Cardiovascular:  RRR, no m/r/g. 2-3+ tense LE edema that is TTP.  Respiratory:   CTA bilaterally with no wheezes/rales/rhonchi.  Normal respiratory effort. Abdomen:  soft, NT, ND Skin:  no rash or induration seen on limited exam Musculoskeletal:  grossly normal tone BUE/BLE, good ROM, no bony abnormality Psychiatric:  blunted mood and affect, speech fluent and appropriate, AOx3 Neurologic:  CN 2-12 grossly intact, moves all extremities in coordinated fashion   Radiological Exams on Admission: Independently reviewed - see discussion in A/P where applicable  CT Cervical Spine Wo Contrast  Result Date: 03/22/2022 CLINICAL DATA:  Neck and right arm pain. EXAM: CT CERVICAL SPINE WITHOUT CONTRAST TECHNIQUE: Multidetector CT imaging of the cervical spine was performed without intravenous contrast. Multiplanar CT image reconstructions were also generated. RADIATION DOSE REDUCTION: This exam was performed according to the departmental dose-optimization program which includes automated exposure control, adjustment of the mA and/or kV according to patient size and/or use of iterative reconstruction technique. COMPARISON:  None Available. FINDINGS: Alignment: Normal Skull base and vertebrae: No acute fracture. No primary bone lesion or focal pathologic process. Soft tissues and spinal canal: No prevertebral fluid or swelling. No visible canal hematoma. Disc levels: The spinal canal is fairly generous. No large cervical disc protrusions, significant spinal or foraminal stenosis. MRI may be helpful if symptoms persist or worsen. Upper chest: The visualized lung apices are grossly clear. Other: Bilateral thyroid lesions are noted. The largest lesion in the left lobe measures 3.5 cm. Recommend thyroid US and consideration for biopsy (ref: J Am Coll Radiol. 2015 Feb;12(2): 143-50). Bilateral carotid artery calcifications are noted. IMPRESSION: 1. Normal alignment and no acute bony findings. 2. No large  cervical disc protrusions, significant spinal or foraminal stenosis. MRI may be helpful if symptoms persist or worsen. 3. Bilateral thyroid lesions. The largest lesion in the left lobe measures 3.5 cm. Recommend thyroid ultrasound examination. Electronically Signed   By: Marijo Sanes M.D.   On: 03/22/2022 11:03   DG Chest Portable 1 View  Result Date: 03/22/2022 CLINICAL DATA:  Chest pain EXAM: PORTABLE CHEST 1 VIEW COMPARISON:  03/14/2022 FINDINGS: Mild cardiac enlargement, unchanged. No pleural effusion or edema. Pulmonary vascular congestion. No airspace disease. Unchanged appearance of right posterior seventh rib deformity. No acute osseous findings. IMPRESSION: Cardiac enlargement and pulmonary vascular congestion. Electronically Signed   By: Kerby Moors M.D.   On: 03/22/2022 08:04    EKG: Independently reviewed.  NSR with rate 76; no evidence of acute ischemia   Labs on Admission: I have personally reviewed the available labs and imaging studies at the time of the admission.  Pertinent labs:    BUN 33/Creatinine 2.67/GFR 20 - improved Albumin 2.5 BNP 1401.5; 687.1 on 7/22 HS troponin 15, 16 WBC 10.3 Hgb 9.5   Assessment and Plan: Principal Problem:   Acute on chronic diastolic (congestive) heart failure (HCC) Active Problems:   Diabetes mellitus (HCC)   Essential hypertension   Pure hypercholesterolemia   CAD in native artery   Anxiety with depression   Cocaine abuse (HCC)   CKD (chronic kidney disease), stage IV (HCC)   COPD not affecting current episode of care ([place])   Class 2 obesity due to excess calories with body mass index (BMI) of 37.0 to 37.9 in adult    Acute on chronic diastolic CHF -Patient with known h/o chronic diastolic CHF presenting with worsening SOB and chest pain after using cocaine -CXR consistent with vascular congestion but not frank edema -Elevated BNP compared to prior -With elevated BNP and abnl CXR, acute decompensated CHF seems probable as  diagnosis -Will place in observation status with telemetry, as there are no current findings necessitating admission (hemodynamic instability, severe electrolyte abnormalities, cardiac arrhythmias, ACS, severe pulmonary edema requiring new O2 therapy, AMS) -5/3 echo with preserved EF and grade 1 diastolic dysfunction; will not repeat -ACE/ARB, beta blocker, and spironolactone are recommended as per guideline-directed medical therapy to reduce morbidity/mortality -In order to facilitate possible transition to [facility] (during hospitalization or in the future), will change from ACE to ARB at this time to further reduce morbidity/mortality; the patient will need to be off ACE for at 36 hours prior to transitioning to [facility] and this is contraindicated with h/o angioedema. -CHF order set utilized; may need CHF team consult but will hold until Echo results are available -Was given Lasix 40 mg x 1 in ER and will repeat with 40 mg IV BID -Continue [place] O2 for now -Normal kidney function at this time, will follow -Repeat EKG in AM -Mildly elevated HS troponin is likely related to demand ischemia; doubt ACS based on symptoms -Consider addition of Bidil or Digoxin if still symptomatic despite guideline-directed therapies, as above -Treatment with SGLT-2-inhibitors reduces CHF-associated hospitalizations and should be considered -Ivabradine and IV iron have also been shown to reduce hospitalizations  Cocaine abuse -Patient reports prior prolonged period of abstinence -She resumed use in April and periodically uses cocaine -UDS pending -She acknowledges the need to quit -Continue Suboxone, no narcotics  HTN -Continue Norvasc, hydralazine -Hold nebivolol in the setting of cocaine use -Hold Benicar-HCT in the setting of advanced CKD (although improved from last hospitalization) -Will also add prn IV hydralazine  HLD -Continue Lipitor  DM -Last A1c was 6.4, indicating good control -Hold  metformin -Glucose was 74 today, will hold glargine and add back if needed -Will cover with moderate-scale SSI for now  CKD stage IV -Improved from last hospitalization and likely back to baseline -Hold Benicar-HCT for now and use with caution if needed    CAD -Cath on 5/3 with culprit lesion for NSTEMI a very  small sub-branch, no need for intervention -Continue DAPT and statin -Continue Imdur    Mood d/o -May be substance-related -Continue Zoloft, hydroxyzine, and Seroquel   COPD -Continue Symbicort (Dulera per formulary), Albuterol  Neck pain/Chronic pain -Patient reports neck pain since last hospitalization -I have reviewed this patient in the [place] Controlled Substances Reporting System.  She is receiving medications from only one provider and appears to be taking them as prescribed. -She is not at particularly high risk of opioid misuse, diversion, or overdose.  -Continue Neurontin -Add Robaxin -CT is negative for obvious cause  Thyroid nodules -Needs [place] as an outpatient -Normal thyroid testing on 6/7   Obesity -Body mass index is 37.43 kg/m..  -Weight loss should be encouraged -Outpatient PCP/bariatric medicine/bariatric surgery f/u encouraged    Advance Care Planning:   Code Status: Full Code   Consults: Heart failure navigator; TOC; nutrition; PT/OT  DVT Prophylaxis: Lovenox  Family Communication: None present; she declined to have me call her daughter at the time of admission since she is at work  Severity of Illness: The appropriate patient status for this patient is OBSERVATION. Observation status is judged to be reasonable and necessary in order to provide the required intensity of service to ensure the patient's safety. The patient's presenting symptoms, physical exam findings, and initial radiographic and laboratory data in the context of their medical condition is felt to place them at decreased risk for further clinical deterioration. Furthermore, it is  anticipated that the patient will be medically stable for discharge from the hospital within 2 midnights of admission.   Author: Karmen Bongo, MD 03/22/2022 1:15 PM  For on call review [email or web address].

## 2022-03-23 DIAGNOSIS — N184 Chronic kidney disease, stage 4 (severe): Secondary | ICD-10-CM | POA: Diagnosis present

## 2022-03-23 DIAGNOSIS — M542 Cervicalgia: Secondary | ICD-10-CM | POA: Diagnosis present

## 2022-03-23 DIAGNOSIS — F17210 Nicotine dependence, cigarettes, uncomplicated: Secondary | ICD-10-CM | POA: Diagnosis present

## 2022-03-23 DIAGNOSIS — I5033 Acute on chronic diastolic (congestive) heart failure: Secondary | ICD-10-CM | POA: Diagnosis present

## 2022-03-23 DIAGNOSIS — I130 Hypertensive heart and chronic kidney disease with heart failure and stage 1 through stage 4 chronic kidney disease, or unspecified chronic kidney disease: Secondary | ICD-10-CM | POA: Diagnosis present

## 2022-03-23 DIAGNOSIS — Z823 Family history of stroke: Secondary | ICD-10-CM | POA: Diagnosis not present

## 2022-03-23 DIAGNOSIS — E1122 Type 2 diabetes mellitus with diabetic chronic kidney disease: Secondary | ICD-10-CM | POA: Diagnosis present

## 2022-03-23 DIAGNOSIS — I2510 Atherosclerotic heart disease of native coronary artery without angina pectoris: Secondary | ICD-10-CM | POA: Diagnosis present

## 2022-03-23 DIAGNOSIS — Z6837 Body mass index (BMI) 37.0-37.9, adult: Secondary | ICD-10-CM | POA: Diagnosis not present

## 2022-03-23 DIAGNOSIS — G20 Parkinson's disease: Secondary | ICD-10-CM | POA: Diagnosis present

## 2022-03-23 DIAGNOSIS — D509 Iron deficiency anemia, unspecified: Secondary | ICD-10-CM | POA: Diagnosis present

## 2022-03-23 DIAGNOSIS — F419 Anxiety disorder, unspecified: Secondary | ICD-10-CM | POA: Diagnosis present

## 2022-03-23 DIAGNOSIS — E6609 Other obesity due to excess calories: Secondary | ICD-10-CM | POA: Diagnosis present

## 2022-03-23 DIAGNOSIS — D631 Anemia in chronic kidney disease: Secondary | ICD-10-CM | POA: Diagnosis present

## 2022-03-23 DIAGNOSIS — F32A Depression, unspecified: Secondary | ICD-10-CM | POA: Diagnosis present

## 2022-03-23 DIAGNOSIS — I252 Old myocardial infarction: Secondary | ICD-10-CM | POA: Diagnosis not present

## 2022-03-23 DIAGNOSIS — G8929 Other chronic pain: Secondary | ICD-10-CM | POA: Diagnosis present

## 2022-03-23 DIAGNOSIS — J449 Chronic obstructive pulmonary disease, unspecified: Secondary | ICD-10-CM | POA: Diagnosis present

## 2022-03-23 DIAGNOSIS — Z794 Long term (current) use of insulin: Secondary | ICD-10-CM | POA: Diagnosis not present

## 2022-03-23 DIAGNOSIS — F1410 Cocaine abuse, uncomplicated: Secondary | ICD-10-CM | POA: Diagnosis present

## 2022-03-23 DIAGNOSIS — E042 Nontoxic multinodular goiter: Secondary | ICD-10-CM | POA: Diagnosis present

## 2022-03-23 DIAGNOSIS — E7800 Pure hypercholesterolemia, unspecified: Secondary | ICD-10-CM | POA: Diagnosis present

## 2022-03-23 DIAGNOSIS — Z79899 Other long term (current) drug therapy: Secondary | ICD-10-CM | POA: Diagnosis not present

## 2022-03-23 DIAGNOSIS — E8809 Other disorders of plasma-protein metabolism, not elsewhere classified: Secondary | ICD-10-CM | POA: Diagnosis present

## 2022-03-23 LAB — GLUCOSE, CAPILLARY
Glucose-Capillary: 110 mg/dL — ABNORMAL HIGH (ref 70–99)
Glucose-Capillary: 170 mg/dL — ABNORMAL HIGH (ref 70–99)
Glucose-Capillary: 212 mg/dL — ABNORMAL HIGH (ref 70–99)
Glucose-Capillary: 131 mg/dL — ABNORMAL HIGH (ref 70–99)

## 2022-03-23 LAB — BASIC METABOLIC PANEL
Anion gap: 10 (ref 5–15)
BUN: 31 mg/dL — ABNORMAL HIGH (ref 6–20)
CO2: 24 mmol/L (ref 22–32)
Calcium: 9 mg/dL (ref 8.9–10.3)
Chloride: 102 mmol/L (ref 98–111)
Creatinine, Ser: 2.55 mg/dL — ABNORMAL HIGH (ref 0.44–1.00)
GFR, Estimated: 21 mL/min — ABNORMAL LOW (ref >60)
Glucose, Bld: 168 mg/dL — ABNORMAL HIGH (ref 70–99)
Potassium: 4.3 mmol/L (ref 3.5–5.1)
Sodium: 136 mmol/L (ref 135–145)

## 2022-03-23 MED ORDER — HYDRALAZINE HCL 10 MG PO TABS
10.0000 mg | ORAL_TABLET | Freq: Three times a day (TID) | ORAL | Status: DC
  Administered 2022-03-23 – 2022-03-24 (×6): 10 mg via ORAL
  Filled 2022-03-23 (×6): qty 1

## 2022-03-23 MED ORDER — TRAMADOL HCL 50 MG PO TABS
50.0000 mg | ORAL_TABLET | Freq: Two times a day (BID) | ORAL | Status: DC | PRN
  Administered 2022-03-23 – 2022-03-24 (×3): 50 mg via ORAL
  Filled 2022-03-23 (×3): qty 1

## 2022-03-23 MED ORDER — ENSURE ENLIVE PO LIQD
237.0000 mL | Freq: Two times a day (BID) | ORAL | Status: DC
  Administered 2022-03-24 – 2022-03-26 (×6): 237 mL via ORAL

## 2022-03-23 MED ORDER — LIDOCAINE 5 % EX PTCH
1.0000 | MEDICATED_PATCH | CUTANEOUS | Status: DC
  Administered 2022-03-23 – 2022-03-27 (×5): 1 via TRANSDERMAL
  Filled 2022-03-23 (×5): qty 1

## 2022-03-23 MED ORDER — ADULT MULTIVITAMIN W/MINERALS CH
1.0000 | ORAL_TABLET | Freq: Every day | ORAL | Status: DC
  Administered 2022-03-23 – 2022-03-27 (×5): 1 via ORAL
  Filled 2022-03-23 (×5): qty 1

## 2022-03-23 MED ORDER — METHOCARBAMOL 500 MG PO TABS
500.0000 mg | ORAL_TABLET | Freq: Three times a day (TID) | ORAL | Status: DC
  Administered 2022-03-23 – 2022-03-27 (×13): 500 mg via ORAL
  Filled 2022-03-23 (×13): qty 1

## 2022-03-23 NOTE — Progress Notes (Signed)
Heart Failure Stewardship Pharmacist Progress Note   PCP: Simona Huh, NP PCP-Cardiologist: Janina Mayo, MD    HPI:  61 yo F with PMH of CHF, T2DM, substance use, HTN, CKD IIIb>IV, CAD, HLD, GERD, anxiety/depression, and Parkinson's disease.  Multiple ED visiis and hospitalizations in 2023 regarding hypertension, ACS, hypoglycemia, and shortness of breath. Admitted 12/20/21 with NSTEMI. LHC at that time with mild CAD. Medical management advised. ECHO 12/20/21 with LVEF 60-65%, G1DD, mild LVH, normal RV.   Most recently hospitalized from 7/22-7/29 with acute on chronic diastolic HF and hypoglycemia. Started on torsemide and discontinued glimepiride on discharge.  Presented to the ED on 8/3 with chest pain and shortness of breath. Reports that she had last used cocaine 1 day before admission to help ease neck pain. CXR with cardiac enlargement and pulmonary vascular congestion.  Current HF Medications: Diuretic: furosemide 40 mg IV BID Other: hydralazine 10 mg TID + Imdur 30 mg daily  Prior to admission HF Medications: Diuretic: torsemide 20 mg daily ACE/ARB/ARNI: olmesartan 40 mg daily Other: hydralazine 25 mg TID + Imdur 30 mg daily  Pertinent Lab Values: Serum creatinine 2.55, BUN 31, Potassium 4.3, Sodium 136, BNP 1401.5, Magnesium 1.8, A1c 6.4   Vital Signs: Weight: 201 lbs (admission weight: 203 lbs) Blood pressure: 130/70s  Heart rate: 60s  I/O: -1.2L yesterday; net -1.3L  Medication Assistance / Insurance Benefits Check: Does the patient have prescription insurance?  Yes Type of insurance plan: [place] Medicare + [place] Medicaid  Outpatient Pharmacy:  Prior to admission outpatient pharmacy: [facility] Is the patient willing to use [facility] pharmacy at discharge? Yes Is the patient willing to transition their outpatient pharmacy to utilize a [facility] outpatient pharmacy?   Pending    Assessment: 1. Acute on chronic diastolic CHF (LVEF 88-91%), due to NICM. NYHA class  III symptoms. - Continue furosemide 40 mg IV BID. Strict I/Os. Daily weights. Keep K>4 and Mag>2. Recheck magnesium tomorrow AM. - No Entresto, spironolactone, or SGLT2i given CKD. eGFR today 21. Appears to be at baseline. - Continue hydralazine 10 mg TID + Imdur 30 mg daily   Plan: 1) Medication changes recommended at this time: - Continue current regimen  2) Patient assistance: - None needed, has [place] Medicaid  Kerby Nora, PharmD, BCPS Heart Failure Cytogeneticist Phone [phone]

## 2022-03-23 NOTE — Progress Notes (Signed)
Heart Failure Navigator Progress Note  Assessed for Heart & Vascular TOC clinic readiness.   Appt scheduled for 8/16 @ 12  Carol [name], PharmD, BCPS Heart Failure Cytogeneticist Phone [phone]

## 2022-03-23 NOTE — Evaluation (Signed)
Occupational Therapy Evaluation Patient Details Name: Carol [name] MRN: [id number] DOB: 11/14/1960 Today's Date: 03/23/2022   History of Present Illness Carol [name] is a 61 y.o. female with medical history significant of DM, HTN, cocaine abuse, stage 4 CKD, Parkinson's, and CAD presenting with chest pain.  She was last admitted from 7/22-29 for hypoglycemia and AMS.  Prior admission in June and this one were also related to acute on chronic diastolic CHF in the setting of cocaine use.  She reports that since she wasn't given medication for her neck pain, she used cocaine to deal with the pain.  She does have a remote h/o polysubstance abuse and was clean for 20 years until she started using cocaine again periodically in April.  Her neck pain continues but she awoke overnight with severe chest pain and SOB.  She has had ongoing LE edema.  She also has had some DOE recently.  She was given Lasix in the ER and the chest discomfort is improving.  She is on RA currently.   Clinical Impression    Pt. Was premedicated prior to session but was still having 10 neck pain. Pt. Sisters were present. Pt. Was limited with I with ADLs and transfers by pain and leg swelling. Discussed with pt. Using ae for LE ADLs and is interested in learning about equipment. Pt. States she can go stay with her dtr if needed at dc. Pt. Does want ttb and 3 in 1 commode at dc. Pt. Family were asking about a private aid for home and discussed they need to talk to social work about that. Acute OT to follow.      Recommendations for follow up therapy are one component of a multi-disciplinary discharge planning process, led by the attending physician.  Recommendations may be updated based on patient status, additional functional criteria and insurance authorization.   Follow Up Recommendations  Home health OT (depending on progress)    Assistance Recommended at Discharge PRN  Patient can return home with the  following A little help with walking and/or transfers;A little help with bathing/dressing/bathroom;Assistance with cooking/housework    Functional Status Assessment  Patient has had a recent decline in their functional status and demonstrates the ability to make significant improvements in function in a reasonable and predictable amount of time.  Equipment Recommendations  BSC/3in1;Tub/shower bench    Recommendations for Other Services       Precautions / Restrictions Precautions Precautions: Fall Restrictions Weight Bearing Restrictions: No      Mobility Bed Mobility                    Transfers Overall transfer level: Needs assistance Equipment used: Rolling walker (2 wheels) Transfers: Sit to/from Stand, Bed to chair/wheelchair/BSC Sit to Stand: Supervision Stand pivot transfers: Min guard         General transfer comment: cues for proper hand placement      Balance                                           ADL either performed or assessed with clinical judgement   ADL Overall ADL's : Needs assistance/impaired Eating/Feeding: Independent   Grooming: Wash/dry hands;Wash/dry face;Supervision/safety;Standing   Upper Body Bathing: Set up;Supervision/ safety;Sitting   Lower Body Bathing: Minimal assistance;Sit to/from stand   Upper Body Dressing : Minimal assistance;Sitting   Lower Body Dressing: Moderate  assistance;Sit to/from stand   Toilet Transfer: Min guard   [place] and Hygiene: Supervision/safety;Sit to/from stand       Functional mobility during ADLs: Min guard General ADL Comments: Pt. is having difficulty wtih LE dredssing and needs ed on use of ae     Vision Baseline Vision/History: 0 No visual deficits Ability to See in Adequate Light: 0 Adequate Patient Visual Report: No change from baseline Vision Assessment?: No apparent visual deficits     Perception     Praxis      Pertinent  Vitals/Pain Pain Assessment Pain Assessment: 0-10 Pain Score: 10-Worst pain ever Pain Location: neck Pain Descriptors / Indicators: Stabbing, Throbbing Pain Intervention(s): Premedicated before session     Hand Dominance Right   Extremity/Trunk Assessment Upper Extremity Assessment Upper Extremity Assessment: Overall WFL for tasks assessed   Lower Extremity Assessment Lower Extremity Assessment: Defer to PT evaluation       Communication Communication Communication: No difficulties   Cognition Arousal/Alertness: Awake/alert Behavior During Therapy: WFL for tasks assessed/performed Overall Cognitive Status: Within Functional Limits for tasks assessed                                       General Comments       Exercises     Shoulder Instructions      Home Living Family/patient expects to be discharged to:: Private residence Living Arrangements: Alone Available Help at Discharge: Family;Available PRN/intermittently Type of Home: House Home Access: Level entry     Home Layout: One level     Bathroom Shower/Tub: Teacher, early years/pre: Standard     Home Equipment: Conservation officer, nature (2 wheels);Cane - single point   Additional Comments: Plans to stay with daughter      Prior Functioning/Environment Prior Level of Function : Independent/Modified Independent             Mobility Comments: uses RW for ambulation, cooks/clean, drives ADLs Comments: independent however reports difficulty with ADL tasks due to SOB.        OT Problem List: Decreased activity tolerance;Pain      OT Treatment/Interventions: Self-care/ADL training;DME and/or AE instruction;Therapeutic activities;Patient/family education    OT Goals(Current goals can be found in the care plan section) Acute Rehab OT Goals Patient Stated Goal: to be better OT Goal Formulation: With patient Time For Goal Achievement: 04/06/22 Potential to Achieve Goals: Good ADL  Goals Pt Will Perform Lower Body Bathing: with modified independence;sit to/from stand Pt Will Perform Lower Body Dressing: with modified independence;with adaptive equipment;sit to/from stand Pt Will Transfer to Toilet: with modified independence;ambulating Pt Will Perform Toileting - Clothing Manipulation and hygiene: with modified independence;with adaptive equipment;sit to/from stand  OT Frequency: Min 2X/week    Co-evaluation PT/OT/SLP Co-Evaluation/Treatment: Yes Reason for Co-Treatment: To address functional/ADL transfers   OT goals addressed during session: ADL's and self-care      AM-PAC OT "6 Clicks" Daily Activity     Outcome Measure Help from another person eating meals?: None Help from another person taking care of personal grooming?: A Little Help from another person toileting, which includes using toliet, bedpan, or urinal?: A Little Help from another person bathing (including washing, rinsing, drying)?: A Little Help from another person to put on and taking off regular upper body clothing?: A Little Help from another person to put on and taking off regular lower body clothing?: A Lot  6 Click Score: 18   End of Session Equipment Utilized During Treatment: Rolling walker (2 wheels) Nurse Communication:  (ok therpy)  Activity Tolerance: Patient limited by pain Patient left: in chair;with call bell/phone within reach;with family/visitor present  OT Visit Diagnosis: Unsteadiness on feet (R26.81);History of falling (Z91.81)                Time: 1595-3967 OT Time Calculation (min): 16 min Charges:  OT General Charges $OT Visit: 1 Visit OT Evaluation $OT Eval Low Complexity: 1 Low  Reece Packer OT/L   Ryanna Teschner 03/23/2022, 12:35 PM

## 2022-03-23 NOTE — Progress Notes (Addendum)
PROGRESS NOTE    Carol [name]  KKX:[id number] DOB: 07/29/1961 DOA: 03/22/2022 PCP: Simona Huh, NP  61 y.o. female with medical history significant of DM, HTN, cocaine abuse, stage 4 CKD, and CAD presenting with chest pain.  She was last admitted from 7/22-29 for hypoglycemia and AMS.  Multiple hospitalizations recently with CHF in the setting of cocaine abuse and NSTEMI prior to that. She reports that since she wasn't given medication for her neck pain, she used cocaine to deal with the pain.  She does have a remote h/o polysubstance abuse and was clean for 20 years until she started using cocaine again periodically in April.  Her neck pain continues but she awoke overnight with severe chest pain and SOB. In the ED she was noted to be volume overloaded, BNP 1400 chest x-ray with edema, troponin was 15  Subjective: -Complains of neck pain, breathing is improving  Assessment and Plan:  Acute on chronic diastolic CHF -Doubtful of compliance with meds although patient reports this -5/3 echo with preserved EF and grade 1 diastolic dysfunction -GDMT limited by CKD 4 -Continue nitrate and low-dose hydralazine -Remains volume overloaded, continue IV Lasix 40 Mg twice daily, -Monitor BMP, daily weights -Would benefit from [facility] follow-up, has poor social support, high risk of frequent readmissions   Cocaine abuse -Counseled, -Reports being on Suboxone for prior history of substance abuse   HTN -Stop Norvasc, decrease hydralazine, continue isosorbide -Stop ARB   DM -Last A1c was 6.4, indicating good control -Stop metformin, hold glargine   CKD stage IV -Improved from last hospitalization and likely back to baseline -Discontinued ARB -Monitor with diuresis, will need nephrology referral, does not remember who she was supposed to see    CAD -Cath on 5/3 with culprit lesion for NSTEMI a small sub-branch of PDA, no need for intervention -Continue DAPT and  statin -Continue Imdur    Mood d/o -May be substance-related -Continue Zoloft, hydroxyzine, and Seroquel    COPD -Continue Symbicort (Dulera per formulary), Albuterol   Neck pain/Chronic pain -Patient reports neck pain since last hospitalization -CT unremarkable, continue Neurontin and Robaxin -Add lidocaine patch and tramadol (patient reports some irritation of her tongue after using it once before, willing to try again) I am reluctant to start narcotics given known history of polysubstance abuse   Thyroid nodules -Needs [place] as an outpatient -Normal thyroid testing on 6/7   Obesity -Body mass index is 37.43 kg/m..  -Weight loss encouraged     Advance Care Planning:   Code Status: Full Code    Consults: Heart failure navigator; TOC; nutrition; PT/OT   DVT Prophylaxis: Lovenox   Consultants:    Procedures:   Antimicrobials:    Objective: Vitals:   03/23/22 0555 03/23/22 0740 03/23/22 0757 03/23/22 1046  BP: 125/77 130/79  137/75  Pulse: 63 61  66  Resp:  18  17  Temp: 97.7 F (36.5 C) 97.7 F (36.5 C)  98.6 F (37 C)  TempSrc: Oral Oral  Oral  SpO2: 96% 95% 96% 96%  Weight: 91.6 kg     Height:        Intake/Output Summary (Last 24 hours) at 03/23/2022 1132 Last data filed at 03/23/2022 1122 Gross per 24 hour  Intake 963 ml  Output 2500 ml  Net -1537 ml   Filed Weights   03/22/22 0647 03/22/22 1654 03/23/22 0555  Weight: 98.9 kg 92.1 kg 91.6 kg    Examination:  General exam: Chronically ill female appears older  than stated age 72: Positive JVD CVS: S1-S2, regular rhythm Lungs: Poor air movement bilaterally, decreased breath sounds to bases Abdomen: Soft, nontender, bowel sounds present Extremities: 1-2+ edema Skin: No rashes Psychiatry:  Mood & affect appropriate.     Data Reviewed:   CBC: Recent Labs  Lab 03/22/22 0712  WBC 10.3  NEUTROABS 7.5  HGB 9.5*  HCT 29.4*  MCV 84.7  PLT 330   Basic Metabolic Panel: Recent Labs  Lab  03/17/22 0210 03/22/22 0712  NA 135 137  K 4.1 3.8  CL 101 103  CO2 25 23  GLUCOSE 197* 73  BUN 36* 33*  CREATININE 3.22* 2.67*  CALCIUM 8.6* 9.0  MG 1.8  --    GFR: Estimated Creatinine Clearance: 24.9 mL/min (A) (by C-G formula based on SCr of 2.67 mg/dL (H)). Liver Function Tests: Recent Labs  Lab 03/22/22 0712  AST 32  ALT 18  ALKPHOS 132*  BILITOT 0.7  PROT 7.4  ALBUMIN 2.5*   Recent Labs  Lab 03/22/22 0712  LIPASE 28   No results for input(s): "AMMONIA" in the last 168 hours. Coagulation Profile: No results for input(s): "INR", "PROTIME" in the last 168 hours. Cardiac Enzymes: No results for input(s): "CKTOTAL", "CKMB", "CKMBINDEX", "TROPONINI" in the last 168 hours. BNP (last 3 results) No results for input(s): "PROBNP" in the last 8760 hours. HbA1C: No results for input(s): "HGBA1C" in the last 72 hours. CBG: Recent Labs  Lab 03/22/22 1456 03/22/22 1852 03/22/22 2104 03/23/22 0558 03/23/22 1043  GLUCAP 86 164* 133* 110* 170*   Lipid Profile: No results for input(s): "CHOL", "HDL", "LDLCALC", "TRIG", "CHOLHDL", "LDLDIRECT" in the last 72 hours. Thyroid Function Tests: No results for input(s): "TSH", "T4TOTAL", "FREET4", "T3FREE", "THYROIDAB" in the last 72 hours. Anemia Panel: No results for input(s): "VITAMINB12", "FOLATE", "FERRITIN", "TIBC", "IRON", "RETICCTPCT" in the last 72 hours. Urine analysis:    Component Value Date/Time   COLORURINE YELLOW 03/10/2022 [place] 03/10/2022 1038   LABSPEC 1.008 03/10/2022 1038   PHURINE 8.0 03/10/2022 1038   GLUCOSEU NEGATIVE 03/10/2022 1038   HGBUR NEGATIVE 03/10/2022 1038   BILIRUBINUR NEGATIVE 03/10/2022 1038   KETONESUR NEGATIVE 03/10/2022 1038   PROTEINUR 100 (A) 03/10/2022 1038   UROBILINOGEN 0.2 01/29/2014 2116   NITRITE NEGATIVE 03/10/2022 1038   LEUKOCYTESUR NEGATIVE 03/10/2022 1038   Sepsis Labs: @LABRCNTIP (procalcitonin:4,lacticidven:4)  )No results found for this or  any previous visit (from the past 240 hour(s)).   Radiology Studies: CT Cervical Spine Wo Contrast  Result Date: 03/22/2022 CLINICAL DATA:  Neck and right arm pain. EXAM: CT CERVICAL SPINE WITHOUT CONTRAST TECHNIQUE: Multidetector CT imaging of the cervical spine was performed without intravenous contrast. Multiplanar CT image reconstructions were also generated. RADIATION DOSE REDUCTION: This exam was performed according to the departmental dose-optimization program which includes automated exposure control, adjustment of the mA and/or kV according to patient size and/or use of iterative reconstruction technique. COMPARISON:  None Available. FINDINGS: Alignment: Normal Skull base and vertebrae: No acute fracture. No primary bone lesion or focal pathologic process. Soft tissues and spinal canal: No prevertebral fluid or swelling. No visible canal hematoma. Disc levels: The spinal canal is fairly generous. No large cervical disc protrusions, significant spinal or foraminal stenosis. MRI may be helpful if symptoms persist or worsen. Upper chest: The visualized lung apices are grossly clear. Other: Bilateral thyroid lesions are noted. The largest lesion in the left lobe measures 3.5 cm. Recommend thyroid US and consideration for biopsy (ref: J  Am Coll Radiol. 2015 Feb;12(2): 143-50). Bilateral carotid artery calcifications are noted. IMPRESSION: 1. Normal alignment and no acute bony findings. 2. No large cervical disc protrusions, significant spinal or foraminal stenosis. MRI may be helpful if symptoms persist or worsen. 3. Bilateral thyroid lesions. The largest lesion in the left lobe measures 3.5 cm. Recommend thyroid ultrasound examination. Electronically Signed   By: Marijo Sanes M.D.   On: 03/22/2022 11:03   DG Chest Portable 1 View  Result Date: 03/22/2022 CLINICAL DATA:  Chest pain EXAM: PORTABLE CHEST 1 VIEW COMPARISON:  03/14/2022 FINDINGS: Mild cardiac enlargement, unchanged. No pleural effusion or  edema. Pulmonary vascular congestion. No airspace disease. Unchanged appearance of right posterior seventh rib deformity. No acute osseous findings. IMPRESSION: Cardiac enlargement and pulmonary vascular congestion. Electronically Signed   By: Kerby Moors M.D.   On: 03/22/2022 08:04     Scheduled Meds:  aspirin EC  81 mg Oral Daily   atorvastatin  80 mg Oral Daily   buprenorphine-naloxone  0.5 tablet Sublingual Daily   clopidogrel  75 mg Oral q morning   docusate sodium  100 mg Oral BID   enoxaparin (LOVENOX) injection  30 mg Subcutaneous Q24H   furosemide  40 mg Intravenous BID   gabapentin  100 mg Oral BID   hydrALAZINE  10 mg Oral TID   insulin aspart  0-15 Units Subcutaneous TID WC   insulin aspart  0-5 Units Subcutaneous QHS   isosorbide mononitrate  30 mg Oral Daily   lidocaine  1 patch Transdermal Q24H   loratadine  10 mg Oral Daily   methocarbamol  500 mg Oral TID   mometasone-formoterol  2 puff Inhalation BID   pantoprazole  40 mg Oral q morning   QUEtiapine  300 mg Oral QHS   sertraline  50 mg Oral QPM   sodium chloride flush  3 mL Intravenous Q12H   Continuous Infusions:   LOS: 0 days    Time spent: 40min   Domenic Polite, MD [facility]   03/23/2022, 11:32 AM

## 2022-03-23 NOTE — Plan of Care (Signed)
  Problem: Education: Goal: Ability to demonstrate management of disease process will improve Outcome: Progressing Goal: Ability to verbalize understanding of medication therapies will improve Outcome: Progressing Goal: Individualized Educational Video(s) Outcome: Progressing   Problem: Clinical Measurements: Goal: Ability to maintain clinical measurements within normal limits will improve Outcome: Progressing Goal: Will remain free from infection Outcome: Progressing Goal: Diagnostic test results will improve Outcome: Progressing Goal: Respiratory complications will improve Outcome: Progressing Goal: Cardiovascular complication will be avoided Outcome: Progressing   Problem: Elimination: Goal: Will not experience complications related to bowel motility Outcome: Progressing Goal: Will not experience complications related to urinary retention Outcome: Progressing   Problem: Pain Managment: Goal: General experience of comfort will improve Outcome: Progressing

## 2022-03-23 NOTE — Progress Notes (Signed)
Initial Nutrition Assessment  DOCUMENTATION CODES:   Obesity unspecified  INTERVENTION:  Liberalize diet from a heart healthy/carb modified/2 gram sodium diet to a 2 gram sodium diet to provide widest variety of menu options to enhance nutritional adequacy Ensure Enlive po BID, each supplement provides 350 kcal and 20 grams of protein. MVI with minerals daily  NUTRITION DIAGNOSIS:   Increased nutrient needs related to acute illness as evidenced by estimated needs.  GOAL:   Patient will meet greater than or equal to 90% of their needs  MONITOR:   PO intake, Supplement acceptance, Labs, Weight trends  REASON FOR ASSESSMENT:   Consult Other (Comment) (nutrition goals)  ASSESSMENT:   Pt admitted with chest pain r/t acute on chronic CHF. Recently admitted 7/22-7/29 for hypoglycemia and AMS PMH significant for Dm, HTN, cocaine abuse, CKD stage IV, Parkinson's and CAD.  Pt reports trying to eat 3 times per day but also states that she usually just eats multiple small meals throughout the day. She usually has grits, eggs and bacon for breakfast and may have a chicken salad sandwich for lunch. She does not usually eat out much, most of her meals are prepared at home by herself or her daughter. She states that her family and friends get upset with her when she does not eat much as they are concerned about her blood sugars but states that she often has early satiety and cannot eat more than a few bites at each meal. She reports checking her blood sugar at least 3 times per day and denies use of insulin when he blood sugar is low. She is concerned about more frequently low blood sugar recently. Discussed with her not going extended periods of time without eating and trying to include more carbohydrate and protein containing snacks between her meals.   Meal completions: 8/4: 30%-breakfast, 85%-lunch  She reports having a fluctuating wt but does not suspect recent wt loss. Unfortunately there  is limited documentation of wt history within the last year. Her wt appears to be down 7.4% since 07/29 which is likely r/t volume status given h/o CHF.   Edema: moderate pitting BLE  Medications: colace, lasix, SSI 0-15 units TID, SSI 0-5 units qhs, protonix  Labs:  BUN 33, Cr 2.67, alkaline phosphatase 132, GFR 20, CBG's 86-212 x24 hours, HgbA1c 6.4%  UOP: 1.2L x24 hours + 443ml x12 hours  I/O's: -623ml since admission  NUTRITION - FOCUSED PHYSICAL EXAM:  Flowsheet Row Most Recent Value  Orbital Region No depletion  Upper Arm Region No depletion  Thoracic and Lumbar Region No depletion  Buccal Region No depletion  Temple Region No depletion  Clavicle Bone Region No depletion  Clavicle and Acromion Bone Region No depletion  Scapular Bone Region No depletion  Dorsal Hand No depletion  Patellar Region No depletion  Anterior Thigh Region No depletion  Posterior Calf Region No depletion  Edema (RD Assessment) None  Hair Reviewed  Eyes Reviewed  Mouth Reviewed  Skin Reviewed  Nails Reviewed       Diet Order:   Diet Order             Diet heart healthy/carb modified Room service appropriate? Yes; Fluid consistency: Thin; Fluid restriction: 1500 mL Fluid  Diet effective now                   EDUCATION NEEDS:   Education needs have been addressed  Skin:  Skin Assessment: Reviewed RN Assessment  Last BM:  8/2  Height:   Ht Readings from Last 1 Encounters:  03/22/22 5\' 4"  (1.626 m)    Weight:   Wt Readings from Last 1 Encounters:  03/23/22 91.6 kg    Ideal Body Weight:  54.5 kg  BMI:  Body mass index is 34.66 kg/m.  Estimated Nutritional Needs:   Kcal:  1600-1800  Protein:  75-90g  Fluid:  1.5L  Clayborne Dana, RDN, LDN Clinical Nutrition

## 2022-03-23 NOTE — Progress Notes (Signed)
Physical Therapy Evaluation Patient Details Name: Carol [name] MRN: [id number] DOB: 1961-07-16 Today's Date: 03/23/2022  History of Present Illness  61 y.o. female with medical history significant of DM, HTN, cocaine abuse, stage 4 CKD, Parkinson's, and CAD presenting 03/22/22 with chest pain.  She was last admitted from 7/22-29 for hypoglycemia and AMS.  Prior admission in June and this one were also related to acute on chronic diastolic CHF in the setting of cocaine use.  She reports that since she wasn't given medication for her neck pain, she used cocaine to deal with the pain.  She does have a remote h/o polysubstance abuse and was clean for 20 years until she started using cocaine again periodically in April.  Her neck pain continues but she awoke overnight with severe chest pain and SOB.  She has had ongoing LE edema.  She also has had some DOE recently.  She was given Lasix in the ER and the chest discomfort is improving.  She is on RA currently.  Clinical Impression  Pt was seen for mobiltiy on RW with help to maneuver to chair, mainly having issues with comfort of her neck and HA pain.  Meds have been administered, but taking some time to relieve her.  Pt is motivated to get up to chair and interact with therapists, and will recommend her to get home health PT as well as consider a CNA for managing her self care and household given she is alone.  Family is asking for a shower chair or bench, and want a new 3 in one commode but also is on OT note.  Follow for acute PT goas as outlined below.     Recommendations for follow up therapy are one component of a multi-disciplinary discharge planning process, led by the attending physician.  Recommendations may be updated based on patient status, additional functional criteria and insurance authorization.  Follow Up Recommendations Home health PT      Assistance Recommended at Discharge Intermittent Supervision/Assistance  Patient can  return home with the following  A little help with walking and/or transfers;A little help with bathing/dressing/bathroom;Assistance with cooking/housework;Assist for transportation;Help with stairs or ramp for entrance    Equipment Recommendations None recommended by PT  Recommendations for Other Services       Functional Status Assessment Patient has had a recent decline in their functional status and demonstrates the ability to make significant improvements in function in a reasonable and predictable amount of time.     Precautions / Restrictions Precautions Precautions: Fall Restrictions Weight Bearing Restrictions: No      Mobility  Bed Mobility Overal bed mobility: Modified Independent                  Transfers Overall transfer level: Needs assistance Equipment used: Rolling walker (2 wheels) Transfers: Sit to/from Stand, Bed to chair/wheelchair/BSC Sit to Stand: Min guard, Supervision   Step pivot transfers: Min guard       General transfer comment: reminders for hand placement and safety to maneuver walker    Ambulation/Gait               General Gait Details: declined to walk far due to HA  Stairs            Wheelchair Mobility    Modified Rankin (Stroke Patients Only)       Balance Overall balance assessment: Needs assistance Sitting-balance support: No upper extremity supported, Feet supported Sitting balance-Leahy Scale: [place]     Standing  balance support: Bilateral upper extremity supported, During functional activity Standing balance-Leahy Scale: Poor                               Pertinent Vitals/Pain Pain Assessment Pain Assessment: 0-10 Pain Score: 10-Worst pain ever Pain Location: neck Pain Descriptors / Indicators: Stabbing, Throbbing Pain Intervention(s): Premedicated before session, Repositioned, Monitored during session, Limited activity within patient's tolerance    Home Living Family/patient  expects to be discharged to:: Private residence Living Arrangements: Alone Available Help at Discharge: Family;Available PRN/intermittently Type of Home: House Home Access: Level entry       Home Layout: One level Home Equipment: Conservation officer, nature (2 wheels);Cane - single point Additional Comments: Plans to stay with daughter    Prior Function Prior Level of Function : Independent/Modified Independent             Mobility Comments: driving, cooks and using RW for support ADLs Comments: independent however reports difficulty with ADL tasks due to SOB.     Hand Dominance   Dominant Hand: Right    Extremity/Trunk Assessment   Upper Extremity Assessment Upper Extremity Assessment: Defer to OT evaluation    Lower Extremity Assessment Lower Extremity Assessment: Overall WFL for tasks assessed    Cervical / Trunk Assessment Cervical / Trunk Assessment: Kyphotic (mild with deep c-spine curve)  Communication   Communication: No difficulties  Cognition Arousal/Alertness: Awake/alert Behavior During Therapy: WFL for tasks assessed/performed Overall Cognitive Status: Within Functional Limits for tasks assessed                                          General Comments General comments (skin integrity, edema, etc.): pt is getting up to move to chair with pain on post head and shoulders, minimally tolerating even touch in that area.  Meds are already given by nursing    Exercises     Assessment/Plan    PT Assessment Patient needs continued PT services  PT Problem List Decreased strength;Decreased activity tolerance;Decreased balance;Decreased mobility;Decreased coordination;Decreased knowledge of use of DME;Decreased safety awareness;Cardiopulmonary status limiting activity;Pain       PT Treatment Interventions DME instruction;Gait training;Functional mobility training;Therapeutic activities;Therapeutic exercise;Balance training;Neuromuscular  re-education;Patient/family education    PT Goals (Current goals can be found in the Care Plan section)  Acute Rehab PT Goals Patient Stated Goal: to get home and feel better PT Goal Formulation: With patient/family Time For Goal Achievement: 04/06/22 Potential to Achieve Goals: Good    Frequency Min 3X/week     Co-evaluation PT/OT/SLP Co-Evaluation/Treatment: Yes Reason for Co-Treatment: To address functional/ADL transfers;Necessary to address cognition/behavior during functional activity PT goals addressed during session: Mobility/safety with mobility;Balance;Proper use of DME OT goals addressed during session: ADL's and self-care       AM-PAC PT "6 Clicks" Mobility  Outcome Measure Help needed turning from your back to your side while in a flat bed without using bedrails?: A Little Help needed moving from lying on your back to sitting on the side of a flat bed without using bedrails?: A Lot Help needed moving to and from a bed to a chair (including a wheelchair)?: A Little Help needed standing up from a chair using your arms (e.g., wheelchair or bedside chair)?: A Little Help needed to walk in hospital room?: A Little Help needed climbing 3-5 steps with a railing? :  A Little 6 Click Score: 17    End of Session Equipment Utilized During Treatment: Gait belt;Other (comment) (purwick) Activity Tolerance: Patient limited by fatigue;Treatment limited secondary to medical complications (Comment) Patient left: in chair;with call bell/phone within reach;with family/visitor present Nurse Communication: Mobility status PT Visit Diagnosis: Unsteadiness on feet (R26.81);Muscle weakness (generalized) (M62.81);Pain Pain - Right/Left:  (neck) Pain - part of body:  (neck)    Time: 8366-2947 PT Time Calculation (min) (ACUTE ONLY): 16 min   Charges:   PT Evaluation $PT Eval Moderate Complexity: 1 Mod         Ramond Dial 03/23/2022, 3:39 PM  Mee Hives, PT PhD Acute Rehab Dept.  Number: [place] [facility] [place]

## 2022-03-24 DIAGNOSIS — I5033 Acute on chronic diastolic (congestive) heart failure: Secondary | ICD-10-CM | POA: Diagnosis not present

## 2022-03-24 LAB — BASIC METABOLIC PANEL
Anion gap: 12 (ref 5–15)
BUN: 36 mg/dL — ABNORMAL HIGH (ref 6–20)
CO2: 24 mmol/L (ref 22–32)
Calcium: 8.8 mg/dL — ABNORMAL LOW (ref 8.9–10.3)
Chloride: 99 mmol/L (ref 98–111)
Creatinine, Ser: 2.72 mg/dL — ABNORMAL HIGH (ref 0.44–1.00)
GFR, Estimated: 20 mL/min — ABNORMAL LOW (ref >60)
Glucose, Bld: 99 mg/dL (ref 70–99)
Potassium: 4.5 mmol/L (ref 3.5–5.1)
Sodium: 135 mmol/L (ref 135–145)

## 2022-03-24 LAB — GLUCOSE, CAPILLARY
Glucose-Capillary: 115 mg/dL — ABNORMAL HIGH (ref 70–99)
Glucose-Capillary: 165 mg/dL — ABNORMAL HIGH (ref 70–99)
Glucose-Capillary: 235 mg/dL — ABNORMAL HIGH (ref 70–99)
Glucose-Capillary: 299 mg/dL — ABNORMAL HIGH (ref 70–99)

## 2022-03-24 LAB — CBC
HCT: 26.3 % — ABNORMAL LOW (ref 36.0–46.0)
Hemoglobin: 8.7 g/dL — ABNORMAL LOW (ref 12.0–15.0)
MCH: 27.5 pg (ref 26.0–34.0)
MCHC: 33.1 g/dL (ref 30.0–36.0)
MCV: 83.2 fL (ref 80.0–100.0)
Platelets: 278 10*3/uL (ref 150–400)
RBC: 3.16 MIL/uL — ABNORMAL LOW (ref 3.87–5.11)
RDW: 13.9 % (ref 11.5–15.5)
WBC: 6.7 10*3/uL (ref 4.0–10.5)
nRBC: 0 % (ref 0.0–0.2)

## 2022-03-24 LAB — MAGNESIUM: Magnesium: 1.8 mg/dL (ref 1.7–2.4)

## 2022-03-24 MED ORDER — SENNOSIDES-DOCUSATE SODIUM 8.6-50 MG PO TABS
1.0000 | ORAL_TABLET | Freq: Two times a day (BID) | ORAL | Status: DC
  Administered 2022-03-24 – 2022-03-27 (×6): 1 via ORAL
  Filled 2022-03-24 (×7): qty 1

## 2022-03-24 MED ORDER — SODIUM CHLORIDE 0.9 % IV SOLN
250.0000 mg | Freq: Every day | INTRAVENOUS | Status: AC
  Administered 2022-03-24 – 2022-03-25 (×2): 250 mg via INTRAVENOUS
  Filled 2022-03-24 (×2): qty 20

## 2022-03-24 MED ORDER — ALBUMIN HUMAN 25 % IV SOLN
25.0000 g | Freq: Four times a day (QID) | INTRAVENOUS | Status: AC
  Administered 2022-03-24 (×2): 25 g via INTRAVENOUS
  Filled 2022-03-24 (×2): qty 100

## 2022-03-24 MED ORDER — HYDROCODONE-ACETAMINOPHEN 5-325 MG PO TABS
1.0000 | ORAL_TABLET | Freq: Four times a day (QID) | ORAL | Status: DC | PRN
  Administered 2022-03-24 – 2022-03-27 (×9): 1 via ORAL
  Filled 2022-03-24 (×10): qty 1

## 2022-03-24 MED ORDER — SODIUM CHLORIDE 0.9 % IV SOLN
510.0000 mg | Freq: Once | INTRAVENOUS | Status: DC
Start: 2022-03-24 — End: 2022-03-24

## 2022-03-24 NOTE — Progress Notes (Addendum)
PROGRESS NOTE    Carol [name]  HWE:[id number] DOB: 08-16-1961 DOA: 03/22/2022 PCP: Simona Huh, NP  61 y.o. female with medical history significant of DM, HTN, cocaine abuse, stage 4 CKD, and CAD presenting with chest pain.  She was last admitted from 7/22-29 for hypoglycemia and AMS.  Multiple hospitalizations recently with CHF in the setting of cocaine abuse and NSTEMI prior to that. She reports that since she wasn't given medication for her neck pain, she used cocaine to deal with the pain.  She does have a remote h/o polysubstance abuse and was clean for 20 years until she started using cocaine again periodically in April.  Her neck pain continues but she awoke overnight with severe chest pain and SOB. In the ED she was noted to be volume overloaded, BNP 1400 chest x-ray with edema, troponin was 15  Subjective: -Reports having severe neck pain, barely able to turn or extend her neck, ongoing for months  Assessment and Plan:  Acute on chronic diastolic CHF -Doubtful of compliance with meds although patient reports this -5/3 echo with preserved EF and grade 1 diastolic dysfunction -GDMT limited by CKD 4 -Continue Imdur and hydralazine, improving with diuresis -Continue IV Lasix today, also has hypoalbuminemia, add IV albumin to augment diuresis -BMP in a.m. -TOC follow-up arranged   Cocaine abuse -Counseled, -Reports being on Suboxone for prior history of substance abuse, will hold Suboxone while on narcotics   HTN -Stop Norvasc, decrease hydralazine, continue isosorbide -Stop ARB   DM -Last A1c was 6.4, indicating good control -Stop metformin, hold glargine   CKD stage IV -Improved from last hospitalization and likely back to baseline -Discontinued ARB -Monitor with diuresis, will need nephrology referral, does not remember who she was supposed to see  Chronic anemia -Secondary to CKD and iron deficiency, anemia panel in June with severe deficiency -Add IV  iron    CAD -Cath on 5/3 with culprit lesion for NSTEMI a small sub-branch of PDA, no need for intervention -Continue DAPT and statin -Continue Imdur    Mood d/o -May be substance-related -Continue Zoloft, hydroxyzine, and Seroquel    COPD -Continue Symbicort (Dulera per formulary), Albuterol   Neck pain/Chronic pain -Patient reports neck pain since last hospitalization -CT unremarkable, continue Neurontin and Robaxin -Tried lidocaine patch and tramadol yesterday with limited benefits, due to severity of the pain will add hydrocodone today, hold home regimen of Suboxone during this time    Thyroid nodules -Needs [place] as an outpatient -Normal thyroid testing on 6/7   Obesity -Body mass index is 37.43 kg/m..  -Weight loss encouraged     Advance Care Planning:   Code Status: Full Code    Consults: Heart failure navigator; TOC; nutrition; PT/OT   DVT Prophylaxis: Lovenox Family communication: Discussed patient detail no family at bedside Disposition: Home likely 2 to 3 days  Consultants:    Procedures:   Antimicrobials:    Objective: Vitals:   03/24/22 0500 03/24/22 0532 03/24/22 0601 03/24/22 0719  BP:   134/84 (!) 140/73  Pulse:   71 67  Resp:    18  Temp:   98.7 F (37.1 C) 98.1 F (36.7 C)  TempSrc:   Oral Oral  SpO2:   94% 97%  Weight: 91.8 kg 91.8 kg    Height:        Intake/Output Summary (Last 24 hours) at 03/24/2022 1111 Last data filed at 03/24/2022 1026 Gross per 24 hour  Intake 949 ml  Output 2900 ml  Net -1951 ml   Filed Weights   03/23/22 0555 03/24/22 0500 03/24/22 0532  Weight: 91.6 kg 91.8 kg 91.8 kg    Examination:  General exam: Chronically ill female sitting up in bed, uncomfortable appearing, AAOx3 HEENT: Positive JVD CVS: S1-S2, regular rhythm Lungs: Decreased breath sounds at the bases, few scattered rales Abdomen: Soft, nontender, bowel sounds present Extremities: 1-2+ edema  Skin: No rashes Psychiatry:  Mood & affect  appropriate.     Data Reviewed:   CBC: Recent Labs  Lab 03/22/22 0712 03/24/22 0221  WBC 10.3 6.7  NEUTROABS 7.5  --   HGB 9.5* 8.7*  HCT 29.4* 26.3*  MCV 84.7 83.2  PLT 290 332   Basic Metabolic Panel: Recent Labs  Lab 03/22/22 0712 03/23/22 1152 03/24/22 0221  NA 137 136 135  K 3.8 4.3 4.5  CL 103 102 99  CO2 23 24 24   GLUCOSE 73 168* 99  BUN 33* 31* 36*  CREATININE 2.67* 2.55* 2.72*  CALCIUM 9.0 9.0 8.8*  MG  --   --  1.8   GFR: Estimated Creatinine Clearance: 24.4 mL/min (A) (by C-G formula based on SCr of 2.72 mg/dL (H)). Liver Function Tests: Recent Labs  Lab 03/22/22 0712  AST 32  ALT 18  ALKPHOS 132*  BILITOT 0.7  PROT 7.4  ALBUMIN 2.5*   Recent Labs  Lab 03/22/22 0712  LIPASE 28   No results for input(s): "AMMONIA" in the last 168 hours. Coagulation Profile: No results for input(s): "INR", "PROTIME" in the last 168 hours. Cardiac Enzymes: No results for input(s): "CKTOTAL", "CKMB", "CKMBINDEX", "TROPONINI" in the last 168 hours. BNP (last 3 results) No results for input(s): "PROBNP" in the last 8760 hours. HbA1C: No results for input(s): "HGBA1C" in the last 72 hours. CBG: Recent Labs  Lab 03/23/22 1043 03/23/22 1531 03/23/22 2101 03/24/22 0602 03/24/22 1050  GLUCAP 170* 212* 131* 115* 165*   Lipid Profile: No results for input(s): "CHOL", "HDL", "LDLCALC", "TRIG", "CHOLHDL", "LDLDIRECT" in the last 72 hours. Thyroid Function Tests: No results for input(s): "TSH", "T4TOTAL", "FREET4", "T3FREE", "THYROIDAB" in the last 72 hours. Anemia Panel: No results for input(s): "VITAMINB12", "FOLATE", "FERRITIN", "TIBC", "IRON", "RETICCTPCT" in the last 72 hours. Urine analysis:    Component Value Date/Time   COLORURINE YELLOW 03/10/2022 [place] 03/10/2022 1038   LABSPEC 1.008 03/10/2022 1038   PHURINE 8.0 03/10/2022 1038   GLUCOSEU NEGATIVE 03/10/2022 1038   HGBUR NEGATIVE 03/10/2022 1038   BILIRUBINUR NEGATIVE  03/10/2022 1038   KETONESUR NEGATIVE 03/10/2022 1038   PROTEINUR 100 (A) 03/10/2022 1038   UROBILINOGEN 0.2 01/29/2014 2116   NITRITE NEGATIVE 03/10/2022 1038   LEUKOCYTESUR NEGATIVE 03/10/2022 1038   Sepsis Labs: @LABRCNTIP (procalcitonin:4,lacticidven:4)  )No results found for this or any previous visit (from the past 240 hour(s)).   Radiology Studies: No results found.   Scheduled Meds:  atorvastatin  80 mg Oral Daily   clopidogrel  75 mg Oral q morning   enoxaparin (LOVENOX) injection  30 mg Subcutaneous Q24H   feeding supplement  237 mL Oral BID BM   furosemide  40 mg Intravenous BID   gabapentin  100 mg Oral BID   hydrALAZINE  10 mg Oral TID   insulin aspart  0-15 Units Subcutaneous TID WC   insulin aspart  0-5 Units Subcutaneous QHS   isosorbide mononitrate  30 mg Oral Daily   lidocaine  1 patch Transdermal Q24H   loratadine  10 mg Oral Daily  methocarbamol  500 mg Oral TID   mometasone-formoterol  2 puff Inhalation BID   multivitamin with minerals  1 tablet Oral Daily   pantoprazole  40 mg Oral q morning   QUEtiapine  300 mg Oral QHS   senna-docusate  1 tablet Oral BID   sertraline  50 mg Oral QPM   sodium chloride flush  3 mL Intravenous Q12H   Continuous Infusions:  albumin human 25 g (03/24/22 1053)     LOS: 1 day    Time spent: 41min   Domenic Polite, MD [facility]   03/24/2022, 11:11 AM

## 2022-03-25 DIAGNOSIS — I5033 Acute on chronic diastolic (congestive) heart failure: Secondary | ICD-10-CM | POA: Diagnosis not present

## 2022-03-25 LAB — CBC
HCT: 26.3 % — ABNORMAL LOW (ref 36.0–46.0)
Hemoglobin: 8.8 g/dL — ABNORMAL LOW (ref 12.0–15.0)
MCH: 27.8 pg (ref 26.0–34.0)
MCHC: 33.5 g/dL (ref 30.0–36.0)
MCV: 83 fL (ref 80.0–100.0)
Platelets: 278 10*3/uL (ref 150–400)
RBC: 3.17 MIL/uL — ABNORMAL LOW (ref 3.87–5.11)
RDW: 13.8 % (ref 11.5–15.5)
WBC: 6.4 10*3/uL (ref 4.0–10.5)
nRBC: 0 % (ref 0.0–0.2)

## 2022-03-25 LAB — BASIC METABOLIC PANEL
Anion gap: 11 (ref 5–15)
BUN: 47 mg/dL — ABNORMAL HIGH (ref 6–20)
CO2: 23 mmol/L (ref 22–32)
Calcium: 8.7 mg/dL — ABNORMAL LOW (ref 8.9–10.3)
Chloride: 97 mmol/L — ABNORMAL LOW (ref 98–111)
Creatinine, Ser: 2.97 mg/dL — ABNORMAL HIGH (ref 0.44–1.00)
GFR, Estimated: 18 mL/min — ABNORMAL LOW (ref >60)
Glucose, Bld: 185 mg/dL — ABNORMAL HIGH (ref 70–99)
Potassium: 4.1 mmol/L (ref 3.5–5.1)
Sodium: 131 mmol/L — ABNORMAL LOW (ref 135–145)

## 2022-03-25 LAB — GLUCOSE, CAPILLARY
Glucose-Capillary: 206 mg/dL — ABNORMAL HIGH (ref 70–99)
Glucose-Capillary: 228 mg/dL — ABNORMAL HIGH (ref 70–99)
Glucose-Capillary: 252 mg/dL — ABNORMAL HIGH (ref 70–99)
Glucose-Capillary: 237 mg/dL — ABNORMAL HIGH (ref 70–99)
Glucose-Capillary: 190 mg/dL — ABNORMAL HIGH (ref 70–99)

## 2022-03-25 MED ORDER — ISOSORBIDE MONONITRATE ER 30 MG PO TB24
15.0000 mg | ORAL_TABLET | Freq: Every day | ORAL | Status: DC
  Administered 2022-03-25 – 2022-03-27 (×3): 15 mg via ORAL
  Filled 2022-03-25 (×3): qty 1

## 2022-03-25 MED ORDER — FUROSEMIDE 40 MG PO TABS
40.0000 mg | ORAL_TABLET | Freq: Every day | ORAL | Status: DC
  Administered 2022-03-25 – 2022-03-26 (×2): 40 mg via ORAL
  Filled 2022-03-25 (×2): qty 1

## 2022-03-25 NOTE — Progress Notes (Signed)
PROGRESS NOTE    Carol [name]  SEG:[id number] DOB: [birth year] DOA: 03/22/2022 PCP: Simona Huh, NP  61 y.o. female with medical history significant of DM, HTN, cocaine abuse, stage 4 CKD, and CAD presenting with chest pain.  She was last admitted from 7/22-29 for hypoglycemia and AMS.  Multiple hospitalizations recently with CHF in the setting of cocaine abuse and NSTEMI prior to that. She reports that since she wasn't given medication for her neck pain, she used cocaine to deal with the pain.  She does have a remote h/o polysubstance abuse and was clean for 20 years until she started using cocaine again periodically in April.  Her neck pain continues but she awoke overnight with severe chest pain and SOB. In the ED she was noted to be volume overloaded, BNP 1400 chest x-ray with edema, troponin was 15  Subjective: -Neck pain is a little better on hydrocodone and lidocaine patch  Assessment and Plan:  Acute on chronic diastolic CHF -Doubtful of compliance with meds although patient reports this -5/3 echo with preserved EF and grade 1 diastolic dysfunction -GDMT limited by CKD 4 -Continue Imdur , hold hydralazine, improving with diuresis -Mild bump in creatinine, cut back on IV Lasix today  -Increase activity-BMP in a.m. -TOC follow-up arranged   Cocaine abuse -Counseled, -Reports being on Suboxone for prior history of substance abuse, hold Suboxone while on narcotics   HTN -Stop Norvasc, decrease hydralazine, continue isosorbide -Stop ARB   DM -Last A1c was 6.4, indicating good control -Stop metformin, hold glargine   CKD stage IV -Improved from last hospitalization and likely back to baseline -Discontinued ARB -Monitor with diuresis, will need nephrology referral, does not remember who she was supposed to see  Chronic anemia -Secondary to CKD and iron deficiency, anemia panel in June with severe deficiency -Add IV iron    CAD -Cath on 5/3 with culprit lesion  for NSTEMI a small sub-branch of PDA, no need for intervention -Continue DAPT and statin -Continue Imdur    Mood d/o -May be substance-related -Continue Zoloft, hydroxyzine, and Seroquel    COPD -Continue Symbicort (Dulera per formulary), Albuterol   Neck pain/Chronic pain -Patient reports neck pain since last hospitalization -CT unremarkable, continue Neurontin and Robaxin -Tried lidocaine patch and tramadol initially with limited benefits, due to severity of the pain will start hydrocodone yesterday hold home regimen of Suboxone during this time, follow-up with orthopedics   Thyroid nodules -Needs [place] as an outpatient -Normal thyroid testing on 6/7   Obesity -Body mass index is 37.43 kg/m..  -Weight loss encouraged     Advance Care Planning:   Code Status: Full Code    Consults: Heart failure navigator; TOC; nutrition; PT/OT   DVT Prophylaxis: Lovenox Family communication: Discussed patient detail no family at bedside Disposition: Home likely 1 to 2 days  Consultants:    Procedures:   Antimicrobials:    Objective: Vitals:   03/24/22 2035 03/25/22 0607 03/25/22 0714 03/25/22 0738  BP:  113/80 132/73   Pulse:  82 73   Resp:  18 19   Temp:  98 F (36.7 C) 98.3 F (36.8 C)   TempSrc:  Oral Oral   SpO2: 96% 98% 94% 95%  Weight:  91.2 kg    Height:        Intake/Output Summary (Last 24 hours) at 03/25/2022 1005 Last data filed at 03/25/2022 0830 Gross per 24 hour  Intake 1401 ml  Output 2450 ml  Net -1049 ml   Filed  Weights   03/24/22 0500 03/24/22 0532 03/25/22 0607  Weight: 91.8 kg 91.8 kg 91.2 kg    Examination:  General exam: Chronically ill female sitting up in bed, appears more comfortable today, AAOx3, no distress HEENT: No JVD CVS: S1-S2, regular rhythm Lungs: Decreased breath sounds to bases, few basilar rales Extremities: 1+ edema  Skin: No rashes Psychiatry:  Mood & affect appropriate.     Data Reviewed:   CBC: Recent Labs  Lab  03/22/22 0712 03/24/22 0221 03/25/22 0344  WBC 10.3 6.7 6.4  NEUTROABS 7.5  --   --   HGB 9.5* 8.7* 8.8*  HCT 29.4* 26.3* 26.3*  MCV 84.7 83.2 83.0  PLT 290 278 497   Basic Metabolic Panel: Recent Labs  Lab 03/22/22 0712 03/23/22 1152 03/24/22 0221 03/25/22 0344  NA 137 136 135 131*  K 3.8 4.3 4.5 4.1  CL 103 102 99 97*  CO2 23 24 24 23   GLUCOSE 73 168* 99 185*  BUN 33* 31* 36* 47*  CREATININE 2.67* 2.55* 2.72* 2.97*  CALCIUM 9.0 9.0 8.8* 8.7*  MG  --   --  1.8  --    GFR: Estimated Creatinine Clearance: 22.3 mL/min (A) (by C-G formula based on SCr of 2.97 mg/dL (H)). Liver Function Tests: Recent Labs  Lab 03/22/22 0712  AST 32  ALT 18  ALKPHOS 132*  BILITOT 0.7  PROT 7.4  ALBUMIN 2.5*   Recent Labs  Lab 03/22/22 0712  LIPASE 28   No results for input(s): "AMMONIA" in the last 168 hours. Coagulation Profile: No results for input(s): "INR", "PROTIME" in the last 168 hours. Cardiac Enzymes: No results for input(s): "CKTOTAL", "CKMB", "CKMBINDEX", "TROPONINI" in the last 168 hours. BNP (last 3 results) No results for input(s): "PROBNP" in the last 8760 hours. HbA1C: No results for input(s): "HGBA1C" in the last 72 hours. CBG: Recent Labs  Lab 03/24/22 1050 03/24/22 1559 03/24/22 2107 03/25/22 0607 03/25/22 0932  GLUCAP 165* 235* 299* 206* 228*   Lipid Profile: No results for input(s): "CHOL", "HDL", "LDLCALC", "TRIG", "CHOLHDL", "LDLDIRECT" in the last 72 hours. Thyroid Function Tests: No results for input(s): "TSH", "T4TOTAL", "FREET4", "T3FREE", "THYROIDAB" in the last 72 hours. Anemia Panel: No results for input(s): "VITAMINB12", "FOLATE", "FERRITIN", "TIBC", "IRON", "RETICCTPCT" in the last 72 hours. Urine analysis:    Component Value Date/Time   COLORURINE YELLOW 03/10/2022 [place] 03/10/2022 1038   LABSPEC 1.008 03/10/2022 1038   PHURINE 8.0 03/10/2022 1038   GLUCOSEU NEGATIVE 03/10/2022 1038   HGBUR NEGATIVE  03/10/2022 1038   BILIRUBINUR NEGATIVE 03/10/2022 1038   KETONESUR NEGATIVE 03/10/2022 1038   PROTEINUR 100 (A) 03/10/2022 1038   UROBILINOGEN 0.2 01/29/2014 2116   NITRITE NEGATIVE 03/10/2022 1038   LEUKOCYTESUR NEGATIVE 03/10/2022 1038   Sepsis Labs: @LABRCNTIP (procalcitonin:4,lacticidven:4)  )No results found for this or any previous visit (from the past 240 hour(s)).   Radiology Studies: No results found.   Scheduled Meds:  atorvastatin  80 mg Oral Daily   clopidogrel  75 mg Oral q morning   enoxaparin (LOVENOX) injection  30 mg Subcutaneous Q24H   feeding supplement  237 mL Oral BID BM   gabapentin  100 mg Oral BID   insulin aspart  0-15 Units Subcutaneous TID WC   insulin aspart  0-5 Units Subcutaneous QHS   isosorbide mononitrate  15 mg Oral Daily   lidocaine  1 patch Transdermal Q24H   loratadine  10 mg Oral Daily   methocarbamol  500 mg Oral TID   mometasone-formoterol  2 puff Inhalation BID   multivitamin with minerals  1 tablet Oral Daily   pantoprazole  40 mg Oral q morning   QUEtiapine  300 mg Oral QHS   senna-docusate  1 tablet Oral BID   sertraline  50 mg Oral QPM   sodium chloride flush  3 mL Intravenous Q12H   Continuous Infusions:  ferric gluconate (FERRLECIT) IVPB 250 mg (03/25/22 0948)     LOS: 2 days    Time spent: 17min   Domenic Polite, MD [facility]   03/25/2022, 10:05 AM

## 2022-03-26 DIAGNOSIS — I5033 Acute on chronic diastolic (congestive) heart failure: Secondary | ICD-10-CM | POA: Diagnosis not present

## 2022-03-26 LAB — CBC
HCT: 25.6 % — ABNORMAL LOW (ref 36.0–46.0)
Hemoglobin: 8.4 g/dL — ABNORMAL LOW (ref 12.0–15.0)
MCH: 27.5 pg (ref 26.0–34.0)
MCHC: 32.8 g/dL (ref 30.0–36.0)
MCV: 83.7 fL (ref 80.0–100.0)
Platelets: 272 10*3/uL (ref 150–400)
RBC: 3.06 MIL/uL — ABNORMAL LOW (ref 3.87–5.11)
RDW: 14.2 % (ref 11.5–15.5)
WBC: 7.5 10*3/uL (ref 4.0–10.5)
nRBC: 0 % (ref 0.0–0.2)

## 2022-03-26 LAB — BASIC METABOLIC PANEL
Anion gap: 9 (ref 5–15)
BUN: 43 mg/dL — ABNORMAL HIGH (ref 6–20)
CO2: 25 mmol/L (ref 22–32)
Calcium: 8.9 mg/dL (ref 8.9–10.3)
Chloride: 101 mmol/L (ref 98–111)
Creatinine, Ser: 2.64 mg/dL — ABNORMAL HIGH (ref 0.44–1.00)
GFR, Estimated: 20 mL/min — ABNORMAL LOW (ref >60)
Glucose, Bld: 225 mg/dL — ABNORMAL HIGH (ref 70–99)
Potassium: 4.3 mmol/L (ref 3.5–5.1)
Sodium: 135 mmol/L (ref 135–145)

## 2022-03-26 LAB — GLUCOSE, CAPILLARY
Glucose-Capillary: 189 mg/dL — ABNORMAL HIGH (ref 70–99)
Glucose-Capillary: 171 mg/dL — ABNORMAL HIGH (ref 70–99)
Glucose-Capillary: 224 mg/dL — ABNORMAL HIGH (ref 70–99)
Glucose-Capillary: 181 mg/dL — ABNORMAL HIGH (ref 70–99)

## 2022-03-26 MED ORDER — HYDRALAZINE HCL 10 MG PO TABS
10.0000 mg | ORAL_TABLET | Freq: Three times a day (TID) | ORAL | Status: DC
  Administered 2022-03-26 – 2022-03-27 (×3): 10 mg via ORAL
  Filled 2022-03-26 (×3): qty 1

## 2022-03-26 MED ORDER — TORSEMIDE 20 MG PO TABS
20.0000 mg | ORAL_TABLET | Freq: Every day | ORAL | Status: DC
  Administered 2022-03-26 – 2022-03-27 (×2): 20 mg via ORAL
  Filled 2022-03-26 (×3): qty 1

## 2022-03-26 MED ORDER — GABAPENTIN 100 MG PO CAPS
200.0000 mg | ORAL_CAPSULE | Freq: Two times a day (BID) | ORAL | Status: DC
Start: 2022-03-26 — End: 2022-03-27
  Administered 2022-03-26 – 2022-03-27 (×2): 200 mg via ORAL
  Filled 2022-03-26 (×2): qty 2

## 2022-03-26 NOTE — Progress Notes (Signed)
PROGRESS NOTE    Carol [name]  ZSW:[id number] DOB: 06-06-1961 DOA: 03/22/2022 PCP: Simona Huh, NP  61 y.o. female with medical history significant of DM, HTN, cocaine abuse, stage 4 CKD, and CAD presenting with chest pain.  She was last admitted from 7/22-29 for hypoglycemia and AMS.  Multiple hospitalizations recently with CHF in the setting of cocaine abuse and NSTEMI prior to that. She reports that since she wasn't given medication for her neck pain, she used cocaine to deal with the pain.  She does have a remote h/o polysubstance abuse and was clean for 20 years until she started using cocaine again periodically in April.  Her neck pain continues but she awoke overnight with severe chest pain and SOB. In the ED she was noted to be volume overloaded, BNP 1400 chest x-ray with edema, troponin was 15  Subjective: -Still complains of neck pain, hydrocodone helping some but wears off, lidocaine patch on, breathing is improving,  Assessment and Plan:  Acute on chronic diastolic CHF -Doubtful of compliance with meds although patient reports this -5/3 echo with preserved EF and grade 1 diastolic dysfunction -GDMT limited by CKD 4 -Continue Imdur , restart hydralazine -Switch to oral torsemide, creatinine improving -Increase activity-BMP in a.m. -TOC follow-up arranged -Discharge planning   Cocaine abuse -Counseled, -Reports being on Suboxone for prior history of substance abuse, hold Suboxone while on narcotics   HTN -Stop Norvasc, restart hydralazine, continue isosorbide -Stop ARB   DM -Last A1c was 6.4, indicating good control -Stop metformin, hold glargine   CKD stage IV -Improved from last hospitalization and likely back to baseline -Discontinued ARB -Monitor with diuresis, will need nephrology referral, does not remember who she was supposed to see  Chronic anemia -Secondary to CKD and iron deficiency, anemia panel in June with severe deficiency -Given IV iron  yesterday    CAD -Cath on 5/3 with culprit lesion for NSTEMI a small sub-branch of PDA, not amenable for intervention -Continue DAPT and statin -Continue Imdur    Mood d/o -May be substance-related -Continue Zoloft, hydroxyzine, and Seroquel    COPD -Continue Symbicort (Dulera per formulary), Albuterol   Neck pain/Chronic pain -Patient reports neck pain since last hospitalization -CT unremarkable, continue Neurontin and Robaxin -Tried lidocaine patch and tramadol initially with limited benefits, due to severity of the pain finally started on hydrocodone, hold home regimen of Suboxone during this time, follow-up with orthopedics   Thyroid nodules -Needs [place] as an outpatient -Normal thyroid testing on 6/7   Obesity -Body mass index is 37.43 kg/m..  -Weight loss encouraged     Advance Care Planning:   Code Status: Full Code    Consults: Heart failure navigator; TOC; nutrition; PT/OT   DVT Prophylaxis: Lovenox Family communication: Discussed patient detail no family at bedside Disposition: Home tomorrow if stable  Consultants:    Procedures:   Antimicrobials:    Objective: Vitals:   03/25/22 2011 03/26/22 0425 03/26/22 0816 03/26/22 0833  BP:  129/66  (!) 148/85  Pulse:  73    Resp:  18    Temp:  98.5 F (36.9 C)    TempSrc:  Oral    SpO2: 98% 95% 95% 98%  Weight:  92.1 kg    Height:        Intake/Output Summary (Last 24 hours) at 03/26/2022 1231 Last data filed at 03/25/2022 2000 Gross per 24 hour  Intake 954 ml  Output 900 ml  Net 54 ml   [employer]  03/24/22 0532 03/25/22 0607 03/26/22 0425  Weight: 91.8 kg 91.2 kg 92.1 kg    Examination:  General exam: Chronically ill female sitting up in bed, appears more comfortable, AAOx3 HEENT: No JVD CVS: S1-S2, regular rhythm Lungs: Decreased breath sounds at the bases, improving air movement Extremities: Trace edema  Skin: No rashes Psychiatry:  Mood & affect appropriate.     Data Reviewed:    CBC: Recent Labs  Lab 03/22/22 0712 03/24/22 0221 03/25/22 0344 03/26/22 0402  WBC 10.3 6.7 6.4 7.5  NEUTROABS 7.5  --   --   --   HGB 9.5* 8.7* 8.8* 8.4*  HCT 29.4* 26.3* 26.3* 25.6*  MCV 84.7 83.2 83.0 83.7  PLT 290 278 278 854   Basic Metabolic Panel: Recent Labs  Lab 03/22/22 0712 03/23/22 1152 03/24/22 0221 03/25/22 0344 03/26/22 0402  NA 137 136 135 131* 135  K 3.8 4.3 4.5 4.1 4.3  CL 103 102 99 97* 101  CO2 23 24 24 23 25   GLUCOSE 73 168* 99 185* 225*  BUN 33* 31* 36* 47* 43*  CREATININE 2.67* 2.55* 2.72* 2.97* 2.64*  CALCIUM 9.0 9.0 8.8* 8.7* 8.9  MG  --   --  1.8  --   --    GFR: Estimated Creatinine Clearance: 25.2 mL/min (A) (by C-G formula based on SCr of 2.64 mg/dL (H)). Liver Function Tests: Recent Labs  Lab 03/22/22 0712  AST 32  ALT 18  ALKPHOS 132*  BILITOT 0.7  PROT 7.4  ALBUMIN 2.5*   Recent Labs  Lab 03/22/22 0712  LIPASE 28   No results for input(s): "AMMONIA" in the last 168 hours. Coagulation Profile: No results for input(s): "INR", "PROTIME" in the last 168 hours. Cardiac Enzymes: No results for input(s): "CKTOTAL", "CKMB", "CKMBINDEX", "TROPONINI" in the last 168 hours. BNP (last 3 results) No results for input(s): "PROBNP" in the last 8760 hours. HbA1C: No results for input(s): "HGBA1C" in the last 72 hours. CBG: Recent Labs  Lab 03/25/22 1115 03/25/22 1620 03/25/22 2034 03/26/22 0608 03/26/22 1055  GLUCAP 252* 237* 190* 189* 171*   Lipid Profile: No results for input(s): "CHOL", "HDL", "LDLCALC", "TRIG", "CHOLHDL", "LDLDIRECT" in the last 72 hours. Thyroid Function Tests: No results for input(s): "TSH", "T4TOTAL", "FREET4", "T3FREE", "THYROIDAB" in the last 72 hours. Anemia Panel: No results for input(s): "VITAMINB12", "FOLATE", "FERRITIN", "TIBC", "IRON", "RETICCTPCT" in the last 72 hours. Urine analysis:    Component Value Date/Time   COLORURINE YELLOW 03/10/2022 [place] 03/10/2022 1038    LABSPEC 1.008 03/10/2022 1038   PHURINE 8.0 03/10/2022 1038   GLUCOSEU NEGATIVE 03/10/2022 1038   HGBUR NEGATIVE 03/10/2022 1038   BILIRUBINUR NEGATIVE 03/10/2022 1038   KETONESUR NEGATIVE 03/10/2022 1038   PROTEINUR 100 (A) 03/10/2022 1038   UROBILINOGEN 0.2 01/29/2014 2116   NITRITE NEGATIVE 03/10/2022 1038   LEUKOCYTESUR NEGATIVE 03/10/2022 1038   Sepsis Labs: @LABRCNTIP (procalcitonin:4,lacticidven:4)  )No results found for this or any previous visit (from the past 240 hour(s)).   Radiology Studies: No results found.   Scheduled Meds:  atorvastatin  80 mg Oral Daily   clopidogrel  75 mg Oral q morning   enoxaparin (LOVENOX) injection  30 mg Subcutaneous Q24H   feeding supplement  237 mL Oral BID BM   gabapentin  200 mg Oral BID   insulin aspart  0-15 Units Subcutaneous TID WC   insulin aspart  0-5 Units Subcutaneous QHS   isosorbide mononitrate  15 mg Oral Daily  lidocaine  1 patch Transdermal Q24H   loratadine  10 mg Oral Daily   methocarbamol  500 mg Oral TID   mometasone-formoterol  2 puff Inhalation BID   multivitamin with minerals  1 tablet Oral Daily   pantoprazole  40 mg Oral q morning   QUEtiapine  300 mg Oral QHS   senna-docusate  1 tablet Oral BID   sertraline  50 mg Oral QPM   sodium chloride flush  3 mL Intravenous Q12H   torsemide  20 mg Oral Daily   Continuous Infusions:     LOS: 3 days    Time spent: 17min   Domenic Polite, MD [facility]   03/26/2022, 12:31 PM

## 2022-03-26 NOTE — Progress Notes (Signed)
Heart Failure Stewardship Pharmacist Progress Note   PCP: Simona Huh, NP PCP-Cardiologist: Janina Mayo, MD    HPI:  61 yo F with PMH of CHF, T2DM, substance use, HTN, CKD IIIb>IV, CAD, HLD, GERD, anxiety/depression, and Parkinson's disease.  Multiple ED visiis and hospitalizations in 2023 regarding hypertension, ACS, hypoglycemia, and shortness of breath. Admitted 12/20/21 with NSTEMI. LHC at that time with mild CAD. Medical management advised. ECHO 12/20/21 with LVEF 60-65%, G1DD, mild LVH, normal RV.   Most recently hospitalized from 7/22-7/29 with acute on chronic diastolic HF and hypoglycemia. Started on torsemide and discontinued glimepiride on discharge.  Presented to the ED on 8/3 with chest pain and shortness of breath. Reports that she had last used cocaine 1 day before admission to help ease neck pain. CXR with cardiac enlargement and pulmonary vascular congestion.  Current HF Medications: Diuretic: torsemide 20 mg daily Other: Imdur 15 mg daily ; Ferrlecit 250 mg IV x 2 doses  Prior to admission HF Medications: Diuretic: torsemide 20 mg daily ACE/ARB/ARNI: olmesartan 40 mg daily Other: hydralazine 25 mg TID + Imdur 30 mg daily  Pertinent Lab Values: Serum creatinine 2.64, BUN 43, Potassium 4.3, Sodium 135, BNP 1401.5, Magnesium 1.8, A1c 6.4  Ferritin 21, TSAT 7 (6/10)  Vital Signs: Weight: 203 lbs (admission weight: 203 lbs) Blood pressure: 130/70s  Heart rate: 70s  I/O: -1.6L yesterday; net -3.2L  Medication Assistance / Insurance Benefits Check: Does the patient have prescription insurance?  Yes Type of insurance plan: [place] Medicare + [place] Medicaid  Outpatient Pharmacy:  Prior to admission outpatient pharmacy: [place] Is the patient willing to use [place] pharmacy at discharge? Yes Is the patient willing to transition their outpatient pharmacy to utilize a [facility] outpatient pharmacy?   Pending    Assessment: 1. Acute on chronic diastolic CHF (LVEF  28-78%), due to NICM. NYHA class III symptoms. - Continue torsemide 20 mg daily. Strict I/Os. Daily weights. Keep K>4 and Mag>2. Recheck magnesium tomorrow AM. - No Entresto, spironolactone, or SGLT2i given CKD. eGFR today 20. Appears to be at baseline. - Continue Imdur 15 mg daily - Ferrlecit 250 mg IV x 2 given    Plan: 1) Medication changes recommended at this time: - Recheck magnesium tomorrow AM  2) Patient assistance: - None needed, has [place] Medicaid  Kerby Nora, PharmD, BCPS Heart Failure Cytogeneticist Phone [phone]

## 2022-03-26 NOTE — Progress Notes (Signed)
PT Cancellation Note  Patient Details Name: Carol [name] MRN: [id number] DOB: 09/10/1960   Cancelled Treatment:    Reason Eval/Treat Not Completed: Patient declined, no reason specified;Pain limiting ability to participate. PT attempts to see this patient twice. Initially pt declining, reports feeling dizzy after receiving pain medication. Later in the day PT returns and reports she is in too much pain to participate in therapy. PT will attempt to follow up as time allows.   Zenaida Niece 03/26/2022, 12:51 PM

## 2022-03-26 NOTE — Plan of Care (Signed)
  Problem: Activity: Goal: Capacity to carry out activities will improve Outcome: Progressing   Problem: Clinical Measurements: Goal: Diagnostic test results will improve Outcome: Progressing Goal: Respiratory complications will improve Outcome: Progressing

## 2022-03-27 ENCOUNTER — Other Ambulatory Visit (HOSPITAL_COMMUNITY): Payer: Self-pay

## 2022-03-27 DIAGNOSIS — I5033 Acute on chronic diastolic (congestive) heart failure: Secondary | ICD-10-CM | POA: Diagnosis not present

## 2022-03-27 LAB — BASIC METABOLIC PANEL
Anion gap: 10 (ref 5–15)
BUN: 45 mg/dL — ABNORMAL HIGH (ref 6–20)
CO2: 26 mmol/L (ref 22–32)
Calcium: 9.4 mg/dL (ref 8.9–10.3)
Chloride: 102 mmol/L (ref 98–111)
Creatinine, Ser: 2.46 mg/dL — ABNORMAL HIGH (ref 0.44–1.00)
GFR, Estimated: 22 mL/min — ABNORMAL LOW (ref >60)
Glucose, Bld: 127 mg/dL — ABNORMAL HIGH (ref 70–99)
Potassium: 4.4 mmol/L (ref 3.5–5.1)
Sodium: 138 mmol/L (ref 135–145)

## 2022-03-27 LAB — CBC
HCT: 28.3 % — ABNORMAL LOW (ref 36.0–46.0)
Hemoglobin: 9.1 g/dL — ABNORMAL LOW (ref 12.0–15.0)
MCH: 27.1 pg (ref 26.0–34.0)
MCHC: 32.2 g/dL (ref 30.0–36.0)
MCV: 84.2 fL (ref 80.0–100.0)
Platelets: 327 10*3/uL (ref 150–400)
RBC: 3.36 MIL/uL — ABNORMAL LOW (ref 3.87–5.11)
RDW: 14.2 % (ref 11.5–15.5)
WBC: 7.8 10*3/uL (ref 4.0–10.5)
nRBC: 0 % (ref 0.0–0.2)

## 2022-03-27 LAB — GLUCOSE, CAPILLARY
Glucose-Capillary: 147 mg/dL — ABNORMAL HIGH (ref 70–99)
Glucose-Capillary: 187 mg/dL — ABNORMAL HIGH (ref 70–99)

## 2022-03-27 LAB — MAGNESIUM: Magnesium: 1.6 mg/dL — ABNORMAL LOW (ref 1.7–2.4)

## 2022-03-27 MED ORDER — HYDRALAZINE HCL 10 MG PO TABS
10.0000 mg | ORAL_TABLET | Freq: Three times a day (TID) | ORAL | 0 refills | Status: DC
  Filled 2022-03-27: qty 90, 30d supply, fill #0

## 2022-03-27 MED ORDER — HYDROCODONE-ACETAMINOPHEN 5-325 MG PO TABS
1.0000 | ORAL_TABLET | Freq: Four times a day (QID) | ORAL | 0 refills | Status: DC | PRN
  Filled 2022-03-27: qty 30, 7d supply, fill #0

## 2022-03-27 MED ORDER — METHOCARBAMOL 500 MG PO TABS
500.0000 mg | ORAL_TABLET | Freq: Three times a day (TID) | ORAL | 0 refills | Status: AC
Start: 2022-03-27 — End: 2022-04-06
  Filled 2022-03-27: qty 30, 10d supply, fill #0

## 2022-03-27 MED ORDER — ISOSORBIDE MONONITRATE ER 30 MG PO TB24: 15.0000 mg | ORAL_TABLET | Freq: Every day | ORAL | Status: DC

## 2022-03-27 MED ORDER — INSULIN GLARGINE 100 UNIT/ML SOLOSTAR PEN
5.0000 [IU] | PEN_INJECTOR | Freq: Every day | SUBCUTANEOUS | Status: DC
Start: 2022-03-27 — End: 2022-05-10

## 2022-03-27 MED ORDER — GABAPENTIN 100 MG PO CAPS
200.0000 mg | ORAL_CAPSULE | Freq: Two times a day (BID) | ORAL | 0 refills | Status: DC
  Filled 2022-03-27: qty 60, 15d supply, fill #0

## 2022-03-27 NOTE — Plan of Care (Signed)

## 2022-03-27 NOTE — Progress Notes (Signed)
Mobility Specialist - Progress Note   03/27/22 1044  Mobility  Activity Ambulated with assistance in hallway  Level of Assistance Modified independent, requires aide device or extra time  Assistive Device Front wheel walker  Distance Ambulated (ft) 550 ft  Activity Response Tolerated well  $Mobility charge 1 Mobility   Pt received in bed and agreeable to mobility. No c/o pain throughout ambulation. Pt left back in room with all needs met.   Larey Seat

## 2022-03-27 NOTE — Care Management Important Message (Signed)
Important Message  Patient Details  Name: Carol [name] MRN: [id number] Date of Birth: June 19, 1961   Medicare Important Message Given:  Yes     Shelda Altes 03/27/2022, 11:19 AM

## 2022-03-27 NOTE — Progress Notes (Signed)
OT Cancellation Note  Patient Details Name: Carol [name] MRN: [id number] DOB: Oct 13, 1960   Cancelled Treatment:    Reason Eval/Treat Not Completed: Patient declined, no reason specified (Patient states she is tired from PT and is having back pain. Patient asks therapist to try later after she had her meds.) Lodema Hong, [place]  Office [place] 03/27/2022, 11:31 AM

## 2022-03-27 NOTE — TOC Initial Note (Signed)
Transition of Care [facility]) - Initial/Assessment Note    Patient Details  Name: Carol [name] MRN: [id number] Date of Birth: [birth year]  Transition of [facility] CM/SW Contact:    Zenon Mayo, RN Phone Number: 03/27/2022, 10:39 AM  Clinical Narrative:                 Patient is for dc today, she will be going to her daughter's Carol [name]'s home address at [address], [place] [address], her phone is [phone].  NCM offered choice for HHPT, HHOT, she states she does not have a preference, NCM made referral to [facility] with [place].  He is able to take referral.  Soc will begin 24 to 48 hrs post dc.  Patient states her sister will be transporting her home today.  Expected Discharge Plan: [place] Barriers to Discharge: No Barriers Identified   Patient Goals and CMS Choice Patient states their goals for this hospitalization and ongoing recovery are:: return home CMS Medicare.gov Compare Post Acute Care list provided to:: Patient Choice offered to / list presented to : Patient  Expected Discharge Plan and Services Expected Discharge Plan: [place] In-house Referral: NA Discharge Planning Services: CM Consult Post Acute Care Choice: [place] arrangements for the past 2 months: Single Family Home Expected Discharge Date: 03/27/22                 DME Agency: NA       HH Arranged: PT, OT HH Agency: [place] Date [facility] Agency Contacted: 03/27/22 Time HH Agency Contacted: 32 Representative spoke with at [place]  Prior Living Arrangements/Services Living arrangements for the past 2 months: [place] Lives with:: Adult Children Patient language and need for interpreter reviewed:: Yes        Need for Family Participation in Patient Care: Yes (Comment) Care giver support system in place?: Yes (comment) Current home services: DME (walker /cane) Criminal Activity/Legal  Involvement Pertinent to Current Situation/Hospitalization: No - Comment as needed  Activities of Daily Living      Permission Sought/Granted                  Emotional Assessment Appearance:: Appears stated age Attitude/Demeanor/Rapport: Engaged Affect (typically observed): Appropriate Orientation: : Oriented to Self, Oriented to Place, Oriented to  Time, Oriented to Situation Alcohol / Substance Use: Not Applicable Psych Involvement: No (comment)  Admission diagnosis:  Cocaine abuse ([place]) [S93.73] Acute diastolic congestive heart failure (HCC) [I50.31] Nonspecific chest pain [R07.9] Acute on chronic diastolic (congestive) heart failure (HCC) [I50.33] Acute on chronic diastolic CHF (congestive heart failure) ([place]) [I50.33] Patient Active Problem List   Diagnosis Date Noted   Acute on chronic diastolic (congestive) heart failure ([place]) 03/22/2022   COPD not affecting current episode of care ([place]) 03/22/2022   Class 2 obesity due to excess calories with body mass index (BMI) of 37.0 to 37.9 in adult 03/22/2022   CHF (congestive heart failure) ([place]) 03/14/2022   CKD (chronic kidney disease), stage IV ([place]) 03/10/2022   Hypertensive urgency 01/22/2022   Acute kidney injury superimposed on chronic kidney disease ([place]) 01/22/2022   Acute on chronic diastolic CHF (congestive heart failure) ([place]) 01/22/2022   Prolonged QT interval 01/22/2022   Elevated troponin 01/22/2022   Thyroid nodule 01/22/2022   Cocaine abuse ([place]) 01/02/2022   Diabetic hypoglycemia ([place]) 12/31/2021   CKD (chronic kidney disease) stage 4, GFR 15-29 ml/min ([place])  12/31/2021   CAD in native artery 12/24/2021   Anxiety with depression 12/24/2021   Pure hypercholesterolemia    Colon cancer screening 02/18/2014   Diabetes mellitus ([place]) 01/29/2014   Essential hypertension 01/29/2014   Closed jaw fracture ([place]) 01/29/2014   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 [place] [address] Phone: [phone] Fax: [phone]     Social Determinants of Health (SDOH) Interventions    Readmission Risk Interventions    03/27/2022   10:36 AM  Readmission Risk Prevention Plan  Transportation Screening Complete  Medication Review ([place]) Complete  PCP or Specialist appointment within 3-5 days of discharge Complete  HRI or [place] Complete  SW Recovery Care/Counseling Consult Complete  [place] Not Applicable

## 2022-03-27 NOTE — Discharge Summary (Signed)
Physician Discharge Summary  Carol [name] QVZ:[id number] DOB: 05-23-1961 DOA: 03/22/2022  PCP: Simona Huh, NP  Admit date: 03/22/2022 Discharge date: 03/27/2022  Time spent: 35 minutes  Recommendations for Outpatient Follow-up:  [facility] for CKD 4, referral sent Endocrinology referral sent for thyroid lesion CHF TOC clinic follow-up-on 8/16 Bmet in 1 week PCP in 1 week, Home health PT, RN, please discontinue glargine and if CBGs remain in low normal range   Discharge Diagnoses:  Principal Problem:   Acute on chronic diastolic (congestive) heart failure (HCC) Chronic neck pain CKD 4   Diabetes mellitus ([place])   Essential hypertension   Pure hypercholesterolemia   CAD in native artery   Anxiety with depression   Cocaine abuse ([place])   CKD (chronic kidney disease), stage IV (HCC)   COPD not affecting current episode of care ([place])   Class 2 obesity due to excess calories with body mass index (BMI) of 37.0 to 37.9 in adult   Discharge Condition: Stable  Diet recommendation: Low-sodium, heart healthy, diabetic  Filed Weights   03/24/22 0532 03/25/22 0607 03/26/22 0425  Weight: 91.8 kg 91.2 kg 92.1 kg    History of present illness:  61 y.o. female with medical history significant of DM, HTN, cocaine abuse, stage 4 CKD, and CAD presenting with chest pain.  She was last admitted from 7/22-29 for hypoglycemia and AMS.  Multiple hospitalizations recently with CHF in the setting of cocaine abuse and NSTEMI prior to that. She reports that since she wasn't given medication for her neck pain, she used cocaine to deal with the pain.  She does have a remote h/o polysubstance abuse and was clean for 20 years until she started using cocaine again periodically in April.  Her neck pain continues but she awoke overnight with severe chest pain and SOB. In the ED she was noted to be volume overloaded, BNP 1400 chest x-ray with edema, troponin was 15  Hospital Course:    Acute on chronic diastolic CHF -Doubtful of compliance with meds although patient reports this -5/3 echo with preserved EF and grade 1 diastolic dysfunction -Diuresed with IV Lasix she is 5.2 L negative -GDMT limited by CKD 4 -Continue Imdur, hydralazine -Transitioned back to oral torsemide, she has hypoalbuminemia as well -Discharged home in a stable condition, home health PT, RN and TOC follow-up set up   Cocaine abuse -Counseled, -Reports being on Suboxone for prior history of substance abuse, stopped Suboxone Suboxone while on narcotics (hydrocodone started for severe neck pain limiting ADLs)   HTN -Discontinued Norvasc and ARB, continue hydralazine and Imdur  DM -Last A1c was 6.4, indicating good control -Discontinued metformin for CKD 4, glargine dose decreased   CKD stage IV -Improved from last hospitalization and likely back to baseline, now creatinine around 2.5 -Discontinued ARB -Monitor with diuresis, I sent a nephrology referral, does not remember who she was supposed to see after recent hospitalization   Chronic anemia -Secondary to CKD and iron deficiency, anemia panel in June with severe deficiency -Given IV iron yesterday    CAD -Cath on 5/3 with culprit lesion for NSTEMI a small sub-branch of PDA, not amenable for intervention -Continue DAPT and statin -Continue Imdur    Mood d/o -May be substance-related -Continue Zoloft, hydroxyzine, and Seroquel    COPD -Continue Symbicort (Dulera per formulary), Albuterol   Neck pain/Chronic pain -Patient reports neck pain since last hospitalization -CT unremarkable, continue Neurontin and Robaxin -Tried lidocaine patch and tramadol initially with limited benefits,  due to severity of the pain finally started on hydrocodone, hold home regimen of Suboxone during this time, follow-up with orthopedics   Thyroid nodules -Needs [place] as an outpatient -Normal thyroid testing on 6/7   Obesity -Body mass index is 37.43  kg/m..  -Weight loss encouraged    Discharge Exam: Vitals:   03/27/22 0803 03/27/22 0850  BP:  127/67  Pulse:    Resp:    Temp:    SpO2: 98%     General exam: Chronically ill female sitting up in bed, appears more comfortable, AAOx3 HEENT: No JVD CVS: S1-S2, regular rhythm Lungs: Decreased breath sounds at the bases, improving air movement Extremities: Trace edema  Skin: No rashes Psychiatry:  Mood & affect appropriate.   Discharge Instructions   Discharge Instructions     Ambulatory referral to Endocrinology   Complete by: As directed    Thyroid lesion noted on imaging   Ambulatory referral to Nephrology   Complete by: As directed    CKD4   Diet - low sodium heart healthy   Complete by: As directed    Diet Carb Modified   Complete by: As directed    Increase activity slowly   Complete by: As directed       Allergies as of 03/27/2022       Reactions   Penicillins Shortness Of Breath, Other (See Comments)   Caused yeast infection Has patient had a PCN reaction causing immediate rash, facial/tongue/throat swelling, SOB or lightheadedness with hypotension: Yes Has patient had a PCN reaction causing severe rash involving mucus membranes or skin necrosis: No Has patient had a PCN reaction that required hospitalization pt was in the hospital at the time of the reaction Has patient had a PCN reaction occurring within the last 10 years: No If all of the above answers are "NO", then may proceed with Cephalosp   Ultram [tramadol] Other (See Comments)   Made her tongue raw        Medication List     STOP taking these medications    amLODipine 10 MG tablet Commonly known as: NORVASC   buprenorphine-naloxone 2-0.5 mg Subl SL tablet Commonly known as: SUBOXONE   ibuprofen 800 MG tablet Commonly known as: ADVIL   insulin lispro 100 UNIT/ML KwikPen Commonly known as: HUMALOG   metFORMIN 500 MG tablet Commonly known as: GLUCOPHAGE    olmesartan-hydrochlorothiazide 40-25 MG tablet Commonly known as: BENICAR HCT       TAKE these medications    albuterol 108 (90 Base) MCG/ACT inhaler Commonly known as: VENTOLIN HFA Inhale 2 puffs into the lungs every 4 (four) hours as needed for wheezing or shortness of breath.   aspirin EC 81 MG tablet Take 1 tablet (81 mg total) by mouth daily. Swallow whole.   atorvastatin 80 MG tablet Commonly known as: LIPITOR Take 1 tablet (80 mg total) by mouth daily.   budesonide-formoterol 160-4.5 MCG/ACT inhaler Commonly known as: SYMBICORT Inhale 2 puffs into the lungs every morning.   cetirizine 10 MG tablet Commonly known as: ZYRTEC Take 10 mg by mouth every morning.   clopidogrel 75 MG tablet Commonly known as: PLAVIX Take 1 tablet (75 mg total) by mouth daily. What changed: when to take this   ergocalciferol 1.25 MG (50000 UT) capsule Commonly known as: VITAMIN D2 Take 50,000 Units by mouth once a week. Wednesday   gabapentin 100 MG capsule Commonly known as: NEURONTIN Take 2 capsules (200 mg total) by mouth 2 (two) times daily. What  changed: how much to take   hydrALAZINE 10 MG tablet Commonly known as: APRESOLINE Take 1 tablet (10 mg total) by mouth 3 (three) times daily. What changed:  medication strength how much to take   HYDROcodone-acetaminophen 5-325 MG tablet Commonly known as: NORCO/VICODIN Take 1 tablet by mouth every 6 (six) hours as needed for moderate pain or severe pain.   hydrOXYzine 50 MG tablet Commonly known as: ATARAX Take 50 mg by mouth daily as needed for anxiety or itching.   insulin glargine 100 UNIT/ML Solostar Pen Commonly known as: LANTUS Inject 5 Units into the skin at bedtime. What changed: how much to take   isosorbide mononitrate 30 MG 24 hr tablet Commonly known as: IMDUR Take 0.5 tablets (15 mg total) by mouth daily. What changed: how much to take   methocarbamol 500 MG tablet Commonly known as: ROBAXIN Take 1  tablet (500 mg total) by mouth 3 (three) times daily for 10 days.   Nebivolol HCl 20 MG Tabs Take 20 mg by mouth every morning.   nitroGLYCERIN 0.4 MG SL tablet Commonly known as: NITROSTAT Place 0.4 mg under the tongue every 5 (five) minutes as needed for chest pain.   pantoprazole 40 MG tablet Commonly known as: PROTONIX Take 1 tablet (40 mg total) by mouth daily at 12 noon. What changed: when to take this   QUEtiapine 300 MG 24 hr tablet Commonly known as: SEROQUEL XR Take 300 mg by mouth at bedtime.   sertraline 50 MG tablet Commonly known as: ZOLOFT Take 50 mg by mouth every evening.   torsemide 20 MG tablet Commonly known as: DEMADEX Take 1 tablet (20 mg total) by mouth daily.   triamcinolone ointment 0.1 % Commonly known as: KENALOG Apply 1 application. topically 2 (two) times daily.       Allergies  Allergen Reactions   Penicillins Shortness Of Breath and Other (See Comments)    Caused yeast infection Has patient had a PCN reaction causing immediate rash, facial/tongue/throat swelling, SOB or lightheadedness with hypotension: Yes Has patient had a PCN reaction causing severe rash involving mucus membranes or skin necrosis: No Has patient had a PCN reaction that required hospitalization pt was in the hospital at the time of the reaction Has patient had a PCN reaction occurring within the last 10 years: No If all of the above answers are "NO", then may proceed with Cephalosp   Ultram [Tramadol] Other (See Comments)    Made her tongue raw    Follow-up Information     Janina Mayo, MD .   Specialty: Cardiology Contact information: [address] [place] [address] [phone]         Emelia Loron, NP. Go on 04/03/2022.   Specialty: Nurse Practitioner Why: 10:45am Contact information: [place] [address] [phone]                  The results of significant diagnostics from this  hospitalization (including imaging, microbiology, ancillary and laboratory) are listed below for reference.    Significant Diagnostic Studies: CT Cervical Spine Wo Contrast  Result Date: 03/22/2022 CLINICAL DATA:  Neck and right arm pain. EXAM: CT CERVICAL SPINE WITHOUT CONTRAST TECHNIQUE: Multidetector CT imaging of the cervical spine was performed without intravenous contrast. Multiplanar CT image reconstructions were also generated. RADIATION DOSE REDUCTION: This exam was performed according to the departmental dose-optimization program which includes automated exposure control, adjustment of the mA and/or kV according to patient size and/or  use of iterative reconstruction technique. COMPARISON:  None Available. FINDINGS: Alignment: Normal Skull base and vertebrae: No acute fracture. No primary bone lesion or focal pathologic process. Soft tissues and spinal canal: No prevertebral fluid or swelling. No visible canal hematoma. Disc levels: The spinal canal is fairly generous. No large cervical disc protrusions, significant spinal or foraminal stenosis. MRI may be helpful if symptoms persist or worsen. Upper chest: The visualized lung apices are grossly clear. Other: Bilateral thyroid lesions are noted. The largest lesion in the left lobe measures 3.5 cm. Recommend thyroid US and consideration for biopsy (ref: J Am Coll Radiol. 2015 Feb;12(2): 143-50). Bilateral carotid artery calcifications are noted. IMPRESSION: 1. Normal alignment and no acute bony findings. 2. No large cervical disc protrusions, significant spinal or foraminal stenosis. MRI may be helpful if symptoms persist or worsen. 3. Bilateral thyroid lesions. The largest lesion in the left lobe measures 3.5 cm. Recommend thyroid ultrasound examination. Electronically Signed   By: Marijo Sanes M.D.   On: 03/22/2022 11:03   DG Chest Portable 1 View  Result Date: 03/22/2022 CLINICAL DATA:  Chest pain EXAM: PORTABLE CHEST 1 VIEW COMPARISON:   03/14/2022 FINDINGS: Mild cardiac enlargement, unchanged. No pleural effusion or edema. Pulmonary vascular congestion. No airspace disease. Unchanged appearance of right posterior seventh rib deformity. No acute osseous findings. IMPRESSION: Cardiac enlargement and pulmonary vascular congestion. Electronically Signed   By: Kerby Moors M.D.   On: 03/22/2022 08:04   DG CHEST PORT 1 VIEW  Result Date: 03/14/2022 CLINICAL DATA:  Dyspnea EXAM: PORTABLE CHEST 1 VIEW COMPARISON:  Radiographs 03/09/2022 FINDINGS: No change from 03/09/2022. Cardiomegaly and pulmonary vascular congestion. No focal consolidation, pleural effusion, or pneumothorax. No acute osseous abnormality. IMPRESSION: Cardiomegaly and pulmonary vascular congestion. Electronically Signed   By: Placido Sou M.D.   On: 03/14/2022 10:06   CT Head Wo Contrast  Result Date: 03/10/2022 CLINICAL DATA:  Mental status change with unknown cause EXAM: CT HEAD WITHOUT CONTRAST TECHNIQUE: Contiguous axial images were obtained from the base of the skull through the vertex without intravenous contrast. RADIATION DOSE REDUCTION: This exam was performed according to the departmental dose-optimization program which includes automated exposure control, adjustment of the mA and/or kV according to patient size and/or use of iterative reconstruction technique. COMPARISON:  01/22/2022 FINDINGS: Brain: No evidence of acute infarction, hemorrhage, hydrocephalus, extra-axial collection or mass lesion/mass effect. 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 Chronic lacune at the anterior left putamen. Vascular: No hyperdense vessel or unexpected calcification. Skull: Normal. Negative for fracture or focal lesion. Sinuses/Orbits: No acute finding. IMPRESSION: Stable head CT.  No acute finding. Electronically Signed   By: Jorje Guild M.D.   On: 03/10/2022 05:04   DG Chest Portable 1 View  Result Date: 03/10/2022 CLINICAL DATA:  Weakness,  hypoglycemia EXAM: PORTABLE CHEST 1 VIEW COMPARISON:  CT chest dated 01/22/2022 FINDINGS: Cardiomegaly with mild perihilar edema. No definite pleural effusions. No pneumothorax. IMPRESSION: Cardiomegaly with mild perihilar edema. Electronically Signed   By: Julian Hy M.D.   On: 03/10/2022 03:28    Microbiology: No results found for this or any previous visit (from the past 240 hour(s)).   Labs: Basic Metabolic Panel: Recent Labs  Lab 03/23/22 1152 03/24/22 0221 03/25/22 0344 03/26/22 0402 03/27/22 0718  NA 136 135 131* 135 138  K 4.3 4.5 4.1 4.3 4.4  CL 102 99 97* 101 102  CO2 24 24 23 25 26   GLUCOSE 168* 99 185* 225* 127*  BUN  31* 36* 47* 43* 45*  CREATININE 2.55* 2.72* 2.97* 2.64* 2.46*  CALCIUM 9.0 8.8* 8.7* 8.9 9.4  MG  --  1.8  --   --  1.6*   Liver Function Tests: Recent Labs  Lab 03/22/22 0712  AST 32  ALT 18  ALKPHOS 132*  BILITOT 0.7  PROT 7.4  ALBUMIN 2.5*   Recent Labs  Lab 03/22/22 0712  LIPASE 28   No results for input(s): "AMMONIA" in the last 168 hours. CBC: Recent Labs  Lab 03/22/22 0712 03/24/22 0221 03/25/22 0344 03/26/22 0402 03/27/22 0718  WBC 10.3 6.7 6.4 7.5 7.8  NEUTROABS 7.5  --   --   --   --   HGB 9.5* 8.7* 8.8* 8.4* 9.1*  HCT 29.4* 26.3* 26.3* 25.6* 28.3*  MCV 84.7 83.2 83.0 83.7 84.2  PLT 290 278 278 272 327   Cardiac Enzymes: No results for input(s): "CKTOTAL", "CKMB", "CKMBINDEX", "TROPONINI" in the last 168 hours. BNP: BNP (last 3 results) Recent Labs    01/22/22 0845 03/10/22 0257 03/22/22 0712  BNP 618.4* 687.1* 1,401.5*    ProBNP (last 3 results) No results for input(s): "PROBNP" in the last 8760 hours.  CBG: Recent Labs  Lab 03/26/22 0608 03/26/22 1055 03/26/22 1629 03/26/22 2133 03/27/22 0640  GLUCAP 189* 171* 224* 181* 147*       Signed:  Domenic Polite MD.  [facility] 03/27/2022, 10:24 AM

## 2022-03-27 NOTE — Progress Notes (Signed)
Heart Failure Stewardship Pharmacist Progress Note   PCP: Simona Huh, NP PCP-Cardiologist: Janina Mayo, MD    HPI:  61 yo F with PMH of CHF, T2DM, substance use, HTN, CKD IIIb>IV, CAD, HLD, GERD, anxiety/depression, and Parkinson's disease.  Multiple ED visiis and hospitalizations in 2023 regarding hypertension, ACS, hypoglycemia, and shortness of breath. Admitted 12/20/21 with NSTEMI. LHC at that time with mild CAD. Medical management advised. ECHO 12/20/21 with LVEF 60-65%, G1DD, mild LVH, normal RV.   Most recently hospitalized from 7/22-7/29 with acute on chronic diastolic HF and hypoglycemia. Started on torsemide and discontinued glimepiride on discharge.  Presented to the ED on 8/3 with chest pain and shortness of breath. Reports that she had last used cocaine 1 day before admission to help ease neck pain. CXR with cardiac enlargement and pulmonary vascular congestion.  Discharge HF Medications: Diuretic: torsemide 20 mg daily Other: hydralazine 10 mg TID + Imdur 15 mg daily  Prior to admission HF Medications: Diuretic: torsemide 20 mg daily ACE/ARB/ARNI: olmesartan 40 mg daily Other: hydralazine 25 mg TID + Imdur 30 mg daily  Pertinent Lab Values: Serum creatinine 2.46, BUN 45, Potassium 4.4, Sodium 138, BNP 1401.5, Magnesium 1.6, A1c 6.4  Ferritin 21, TSAT 7 (6/10)  Vital Signs: Weight: 203 lbs (admission weight: 203 lbs) Blood pressure: 130/70s  Heart rate: 60-90s  I/O: not documented yesterday; net -2.6L  Medication Assistance / Insurance Benefits Check: Does the patient have prescription insurance?  Yes Type of insurance plan: [place] Medicare + [place] Medicaid  Outpatient Pharmacy:  Prior to admission outpatient pharmacy: [facility] Is the patient willing to use [facility] pharmacy at discharge? Yes Is the patient willing to transition their outpatient pharmacy to utilize a [facility] outpatient pharmacy?   Pending    Assessment: 1. Acute on chronic diastolic CHF  (LVEF 82-99%), due to NICM. NYHA class III symptoms. - Continue torsemide 20 mg daily. Strict I/Os. Daily weights. Keep K>4 and Mag>2. K low 1.6 today, needs replacement - No Entresto, spironolactone, or SGLT2i given CKD. eGFR today 22. Appears to be at baseline. - Continue hydralazine 10 mg TID + Imdur 15 mg daily - Ferrlecit 250 mg IV x 2 given    Plan: 1) Medication changes recommended at this time: - Magnesium 2 g IV x 1  2) Patient assistance: - None needed, has [place] Medicaid  Kerby Nora, PharmD, BCPS Heart Failure Cytogeneticist Phone [phone]

## 2022-03-27 NOTE — TOC Transition Note (Addendum)
Transition of Care [facility]) - CM/SW Discharge Note   Patient Details  Name: Carol [name] MRN: [id number] Date of Birth: 09-13-1960  Transition of [facility]) CM/SW Contact:  Zenon Mayo, RN Phone Number: 03/27/2022, 10:41 AM   Clinical Narrative:    Patient is for dc today, she will be going to her daughter's Carol [name]'s home address at [address], [place] [address], her phone is [phone].  NCM offered choice for HHPT, HHOT, she states she does not have a preference, NCM made referral to [facility] with [place].  He is able to take referral.  Soc will begin 24 to 48 hrs post dc.  Patient states her sister will be transporting her home today. She states she has a scale at home that she weighs her self on and she does not have a bp cuff but she will purchase one.  She states she eats a low sodium diet.     Final next level of care: [place] Barriers to Discharge: No Barriers Identified   Patient Goals and CMS Choice Patient states their goals for this hospitalization and ongoing recovery are:: return home CMS Medicare.gov Compare Post Acute Care list provided to:: Patient Choice offered to / list presented to : Patient  Discharge Placement                       Discharge Plan and Services In-house Referral: NA Discharge Planning Services: CM Consult Post Acute Care Choice: Home Health            DME Agency: NA       HH Arranged: PT, OT HH Agency: [facility] Date [place]: 03/27/22 Time [place]: 1410 Representative spoke with at [place]: [place] ([place]) Interventions     Readmission Risk Interventions    03/27/2022   10:36 AM  Readmission Risk Prevention Plan  Transportation Screening Complete  Medication Review [employer] photographer) Complete  PCP or Specialist appointment within 3-5 days of discharge Complete  HRI or [place] Complete  SW  Recovery Care/Counseling Consult Complete  [place] Not Applicable

## 2022-04-03 NOTE — Progress Notes (Signed)
HEART & VASCULAR TRANSITION OF CARE CONSULT NOTE     Referring Physician: Primary Care: Primary Cardiologist:  HPI: Referred to clinic by *** for heart failure consultation. 61 y.o. female with history of HTN, HLD, DM, hx cocaine, ETOH and tobacco abuse, CKD III, hx of sarcoidosis involving liver (previously followed by Hepatology at [facility], no-showed follow-ups)  Admitted in April 2023 with NSTEMI and uncontrolled HTN. Echo: EF 60-65%, mild LVH, geade I DD, RV okay. LHC demonstrated occluded small subranch off right PDA (treated medically), mild disease in LAD and RCA. Developed AKI post cath with Scr peaking at 3.2.   Readmitted in May 2023 with episodes of hypoglycemia. Received IV fluids. Developed volume overload and was diuresed. BP meds adjusted d/t hypertensive urgency.  Readmitted 06/23 with CP and hypertensive emergency. Minimally elevated HS troponin with flat trend. CTA chest negative for aortic dissection. CP atypical for angina. UDS + cocaine. Had been using crack cocaine since April d/t stress and using ibuprofen daily. She was volume overloaded and diuresed.BP rapidly corrected and became hypotensive. Had recurrent AKI and required iHD. Renal function improved enough to remove HD cath prior to discharge.    Admitted again 07/23 with decreased responsiveness and hypoglycemia. Had not been taking medications at home. She was volume overloaded and diuresised with IV lasix. Scr upper 2s - 3s during admission.  Unfortunately admitted again 08/03-08/08/23 with a/c CHF and recent cocaine use.  She has no showed all of her scheduled visits with [facility].   She is here today for hospital follow-up.   Cardiac Testing    Review of Systems: [y] = yes, [ ]  = no   General: Weight gain [ ] ; Weight loss [ ] ; Anorexia [ ] ; Fatigue [ ] ; Fever [ ] ; Chills [ ] ; Weakness [ ]   Cardiac: Chest pain/pressure [ ] ; Resting SOB [ ] ; Exertional SOB [ ] ; Orthopnea [ ] ; Pedal Edema [ ] ;  Palpitations [ ] ; Syncope [ ] ; Presyncope [ ] ; Paroxysmal nocturnal dyspnea[ ]   Pulmonary: Cough [ ] ; Wheezing[ ] ; Hemoptysis[ ] ; Sputum [ ] ; Snoring [ ]   GI: Vomiting[ ] ; Dysphagia[ ] ; Melena[ ] ; Hematochezia [ ] ; Heartburn[ ] ; Abdominal pain [ ] ; Constipation [ ] ; Diarrhea [ ] ; BRBPR [ ]   GU: Hematuria[ ] ; Dysuria [ ] ; Nocturia[ ]   Vascular: Pain in legs with walking [ ] ; Pain in feet with lying flat [ ] ; Non-healing sores [ ] ; Stroke [ ] ; TIA [ ] ; Slurred speech [ ] ;  Neuro: Headaches[ ] ; Vertigo[ ] ; Seizures[ ] ; Paresthesias[ ] ;Blurred vision [ ] ; Diplopia [ ] ; Vision changes [ ]   Ortho/Skin: Arthritis [ ] ; Joint pain [ ] ; Muscle pain [ ] ; Joint swelling [ ] ; Back Pain [ ] ; Rash [ ]   Psych: Depression[ ] ; Anxiety[ ]   Heme: Bleeding problems [ ] ; Clotting disorders [ ] ; Anemia [ ]   Endocrine: Diabetes [ ] ; Thyroid dysfunction[ ]    Past Medical History:  Diagnosis Date   Abnormal liver function tests    Acute kidney injury (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     aspirin EC 81 MG EC tablet Take 1 tablet (81 mg total) by mouth daily. Swallow whole. 30 tablet 5   atorvastatin (LIPITOR) 80 MG tablet Take 1 tablet (80 mg total) by mouth daily. 30 tablet 5   budesonide-formoterol (SYMBICORT) 160-4.5 MCG/ACT inhaler Inhale 2 puffs into the lungs every morning.     cetirizine (ZYRTEC) 10 MG tablet Take 10 mg by mouth every  morning.     clopidogrel (PLAVIX) 75 MG tablet Take 1 tablet (75 mg total) by mouth daily. (Patient taking differently: Take 75 mg by mouth every morning.) 30 tablet 5   ergocalciferol (VITAMIN D2) 1.25 MG (50000 UT) capsule Take 50,000 Units by mouth once a week. Wednesday     gabapentin (NEURONTIN) 100 MG capsule Take 2 capsules (200 mg total) by mouth 2 (two) times daily. 60 capsule 0   hydrALAZINE (APRESOLINE) 10 MG tablet Take 1 tablet (10 mg total) by mouth 3 (three) times daily. 90 tablet 0   HYDROcodone-acetaminophen (NORCO/VICODIN) 5-325 MG tablet Take 1 tablet by mouth every 6 (six) hours as needed for moderate pain or severe pain. 30 tablet 0   hydrOXYzine (ATARAX) 50 MG tablet Take 50 mg by mouth daily as needed for anxiety or itching.     insulin glargine (LANTUS) 100 UNIT/ML Solostar Pen Inject 5 Units into the skin at bedtime.     isosorbide mononitrate (IMDUR) 30 MG 24 hr tablet Take 0.5 tablets (15 mg total) by mouth daily.     methocarbamol (ROBAXIN) 500 MG tablet Take 1 tablet (500 mg total) by mouth 3 (three) times daily for 10 days. 30 tablet 0   Nebivolol HCl 20 MG TABS Take 20 mg by mouth every morning.     nitroGLYCERIN (NITROSTAT) 0.4 MG SL tablet Place 0.4 mg under the tongue every 5 (five) minutes as needed for chest pain.     pantoprazole (PROTONIX) 40 MG tablet Take 1 tablet (40 mg total) by mouth daily at 12 noon. (Patient taking differently: Take 40 mg by mouth every morning.) 30 tablet 0   QUEtiapine (SEROQUEL XR) 300 MG 24 hr tablet Take 300 mg by mouth at bedtime.     sertraline (ZOLOFT) 50 MG tablet Take 50 mg by mouth every evening.     torsemide (DEMADEX) 20 MG tablet Take 1 tablet (20 mg total) by mouth daily. 30 tablet 2   triamcinolone ointment (KENALOG) 0.1 % Apply 1 application. topically 2 (two) times daily.     No current facility-administered medications for this visit.    Allergies  Allergen Reactions   Penicillins Shortness Of Breath and Other  (See Comments)    Caused yeast infection Has patient had a PCN reaction causing immediate rash, facial/tongue/throat swelling, SOB or lightheadedness with hypotension: Yes Has patient had a PCN reaction causing severe rash involving mucus membranes or skin necrosis: No Has patient had a PCN reaction that required hospitalization pt was in the hospital at the time of the reaction Has patient had a PCN reaction occurring within the last 10 years: No If all of the above answers are "NO", then may proceed with Cephalosp   Ultram [Tramadol] Other (See Comments)    Made her tongue raw      Social History   Socioeconomic History   Marital status: Single  Spouse name: Not on file   Number of children: 3   Years of education: Not on file   Highest education level: Not on file  Occupational History   Not on file  Tobacco Use   Smoking status: Some Days    Packs/day: 0.25    Years: 3.00    Total pack years: 0.75    Types: Cigarettes   Smokeless tobacco: Never  Vaping Use   Vaping Use: Never used  Substance and Sexual Activity   Alcohol use: Yes    Alcohol/week: 0.0 standard drinks of alcohol    Comment: occasional   Drug use: No    Comment: none for 13 years   Sexual activity: Not on file  Other Topics Concern   Not on file  Social History Narrative   Not on file   Social Determinants of Health   Financial Resource Strain: Not on file  Food Insecurity: Not on file  Transportation Needs: Not on file  Physical Activity: Not on file  Stress: Not on file  Social Connections: Not on file  Intimate Partner Violence: Not on file      Family History  Problem Relation Age of Onset   Diabetes Mother    Kidney disease Mother    Seizures Father    Hypertension Father    Stroke Father    Asthma Other     There were no vitals filed for this visit.  PHYSICAL EXAM: General:  Well appearing. No respiratory difficulty HEENT: normal Neck: supple. no JVD. Carotids 2+ bilat;  no bruits. No lymphadenopathy or thryomegaly appreciated. Cor: PMI nondisplaced. Regular rate & rhythm. No rubs, gallops or murmurs. Lungs: clear Abdomen: soft, nontender, nondistended. No hepatosplenomegaly. No bruits or masses. Good bowel sounds. Extremities: no cyanosis, clubbing, rash, edema Neuro: alert & oriented x 3, cranial nerves grossly intact. moves all 4 extremities w/o difficulty. Affect pleasant.  ECG:   ASSESSMENT & PLAN: HFpEF  Hypertension  CAD  CKD IV  Substance abuse  Sarcoidosis of liver   NYHA *** GDMT  Diuretic- BB- Ace/ARB/ARNI MRA SGLT2i    Referred to HFSW (PCP, Medications, Transportation, ETOH Abuse, Drug Abuse, Insurance, Financial ): Yes or No Refer to Pharmacy: Yes or No Refer to Home Health: Yes on No Refer to Advanced Heart Failure Clinic: Yes or no  Refer to General Cardiology: Yes or No  Follow up

## 2022-04-04 ENCOUNTER — Ambulatory Visit (HOSPITAL_COMMUNITY)
Admission: RE | Admit: 2022-04-04 | Discharge: 2022-04-04 | Disposition: A | Discharge status: Home / Self Care | Payer: Medicare Other | Source: Ambulatory Visit | Attending: Internal Medicine | Admitting: Internal Medicine

## 2022-04-04 ENCOUNTER — Other Ambulatory Visit (HOSPITAL_COMMUNITY): Payer: Self-pay | Admitting: Internal Medicine

## 2022-04-04 ENCOUNTER — Encounter (HOSPITAL_COMMUNITY): Payer: Self-pay

## 2022-04-04 ENCOUNTER — Telehealth (HOSPITAL_COMMUNITY): Payer: Self-pay | Admitting: *Deleted

## 2022-04-04 ENCOUNTER — Ambulatory Visit (HOSPITAL_COMMUNITY)
Admit: 2022-04-04 | Discharge: 2022-04-04 | Disposition: A | Discharge status: Home / Self Care | Payer: Medicare Other | Attending: Physician Assistant | Admitting: Physician Assistant

## 2022-04-04 VITALS — BP 158/70 | HR 77 | Wt 189.6 lb

## 2022-04-04 DIAGNOSIS — I10 Essential (primary) hypertension: Secondary | ICD-10-CM | POA: Diagnosis not present

## 2022-04-04 DIAGNOSIS — I2510 Atherosclerotic heart disease of native coronary artery without angina pectoris: Secondary | ICD-10-CM | POA: Diagnosis not present

## 2022-04-04 DIAGNOSIS — I252 Old myocardial infarction: Secondary | ICD-10-CM | POA: Insufficient documentation

## 2022-04-04 DIAGNOSIS — I4729 Other ventricular tachycardia: Secondary | ICD-10-CM

## 2022-04-04 DIAGNOSIS — I4902 Ventricular flutter: Secondary | ICD-10-CM

## 2022-04-04 DIAGNOSIS — N184 Chronic kidney disease, stage 4 (severe): Secondary | ICD-10-CM | POA: Diagnosis not present

## 2022-04-04 DIAGNOSIS — Z7902 Long term (current) use of antithrombotics/antiplatelets: Secondary | ICD-10-CM | POA: Diagnosis not present

## 2022-04-04 DIAGNOSIS — R778 Other specified abnormalities of plasma proteins: Secondary | ICD-10-CM | POA: Diagnosis not present

## 2022-04-04 DIAGNOSIS — D861 Sarcoidosis of lymph nodes: Secondary | ICD-10-CM

## 2022-04-04 DIAGNOSIS — Z7982 Long term (current) use of aspirin: Secondary | ICD-10-CM | POA: Diagnosis not present

## 2022-04-04 DIAGNOSIS — E1122 Type 2 diabetes mellitus with diabetic chronic kidney disease: Secondary | ICD-10-CM | POA: Diagnosis not present

## 2022-04-04 DIAGNOSIS — F1910 Other psychoactive substance abuse, uncomplicated: Secondary | ICD-10-CM | POA: Diagnosis not present

## 2022-04-04 DIAGNOSIS — I472 Ventricular tachycardia, unspecified: Secondary | ICD-10-CM | POA: Insufficient documentation

## 2022-04-04 DIAGNOSIS — I130 Hypertensive heart and chronic kidney disease with heart failure and stage 1 through stage 4 chronic kidney disease, or unspecified chronic kidney disease: Secondary | ICD-10-CM | POA: Diagnosis present

## 2022-04-04 DIAGNOSIS — I5033 Acute on chronic diastolic (congestive) heart failure: Secondary | ICD-10-CM

## 2022-04-04 DIAGNOSIS — D8689 Sarcoidosis of other sites: Secondary | ICD-10-CM | POA: Diagnosis not present

## 2022-04-04 LAB — BASIC METABOLIC PANEL
Anion gap: 10 (ref 5–15)
BUN: 25 mg/dL — ABNORMAL HIGH (ref 6–20)
CO2: 19 mmol/L — ABNORMAL LOW (ref 22–32)
Calcium: 8.9 mg/dL (ref 8.9–10.3)
Chloride: 107 mmol/L (ref 98–111)
Creatinine, Ser: 2.05 mg/dL — ABNORMAL HIGH (ref 0.44–1.00)
GFR, Estimated: 27 mL/min — ABNORMAL LOW (ref >60)
Glucose, Bld: 189 mg/dL — ABNORMAL HIGH (ref 70–99)
Potassium: 3.8 mmol/L (ref 3.5–5.1)
Sodium: 136 mmol/L (ref 135–145)

## 2022-04-04 LAB — BRAIN NATRIURETIC PEPTIDE: B Natriuretic Peptide: 907.5 pg/mL — ABNORMAL HIGH (ref 0.0–100.0)

## 2022-04-04 MED ORDER — POTASSIUM CHLORIDE ER 10 MEQ PO TBCR: 10.0000 meq | EXTENDED_RELEASE_TABLET | Freq: Every day | ORAL | 3 refills | Status: DC

## 2022-04-04 MED ORDER — HYDRALAZINE HCL 25 MG PO TABS: 25.0000 mg | ORAL_TABLET | Freq: Three times a day (TID) | ORAL | 3 refills | Status: DC

## 2022-04-04 NOTE — Telephone Encounter (Signed)
Called to confirm Heart & Vascular Transitions of Care appointment at 12 noon on 04/04/2022. Patient reminded to bring all medications and pill box organizer with them. Confirmed patient has transportation. Gave directions, instructed to utilize [place] parking.  Confirmed appointment prior to ending call.    Earnestine Leys, BSN, Clinical cytogeneticist Only

## 2022-04-04 NOTE — Telephone Encounter (Signed)
Pt aware, agreeable, and verbalized understanding, rx sent in 

## 2022-04-04 NOTE — Progress Notes (Signed)
ReDS Vest / Clip - 04/04/22 1200       ReDS Vest / Clip   Station Marker A    Ruler Value 27.5    ReDS Value Range High volume overload    ReDS Actual Value 41

## 2022-04-04 NOTE — Telephone Encounter (Signed)
-----   Message from Joette Catching, [place] sent at 04/04/2022  4:02 PM EDT ----- Kidney function improving. Fluid marker coming down. Start potassium 10 mEq daily since increased water pills today.

## 2022-04-04 NOTE — Patient Instructions (Signed)
Medication Changes:  Increase Torsemide to 40 mg (2 tabs) Daily FOR 4 DAYS ONLY, then back to 20 mg (1 tab) Daily  Increase Hydralazine to 25 mg Three times a day   Lab Work:  Labs done today, we will call you for abnormal results  Testing/Procedures:  Your physician has requested that you have a cardiac MRI. Cardiac MRI uses a computer to create images of your heart as its beating, producing both still and moving pictures of your heart and major blood vessels. For further information please visit [email or web address] Please follow the instruction sheet given to you today for more information. ONCE APPROVED BY INSURANCE WE WILL CALL YOU TO SCHEDULE  Referrals:  You have been referred to our [facility], they will call you to schedule a home visit  You have been referred to [facility], they will call you for an appointment  Special Instructions // Education:  Do the following things EVERYDAY: Weigh yourself in the morning before breakfast. Write it down and keep it in a log. Take your medicines as prescribed Eat low salt foods--Limit salt (sodium) to 2000 mg per day.  Stay as active as you can everyday Limit all fluids for the day to less than 2 liters   Follow-Up in: Thank you for allowing [place] to provider your heart failure care after your recent hospitalization. Please follow-up in 3 weeks and again in 6 weeks.  At the [facility], you and your health needs are our priority. We have a designated team specialized in the treatment of Heart Failure. This Care Team includes your primary Heart Failure Specialized Cardiologist (physician), Advanced Practice Providers (APPs- Physician Assistants and Nurse Practitioners), and Pharmacist who all work together to provide you with the care you need, when you need it.   You may see any of the following providers on your designated Care Team at your next follow up:  Dr Glori Bickers Dr Haynes Kerns, NP Lyda Jester, [place] [facility] [place], [place] Audry Riles, PharmD   Please be sure to bring in all your medications bottles to every appointment.   Need to Contact us:  If you have any questions or concerns before your next appointment please send [place] a message through [place] or call our office at [phone].    TO LEAVE A MESSAGE FOR THE NURSE SELECT OPTION 2, PLEASE LEAVE A MESSAGE INCLUDING: YOUR NAME DATE OF BIRTH CALL BACK NUMBER REASON FOR CALL**this is important as we prioritize the call backs  YOU WILL RECEIVE A CALL BACK THE SAME DAY AS LONG AS YOU CALL BEFORE 4:00 PM

## 2022-04-04 NOTE — Progress Notes (Signed)
Paramedicine Initial Assessment:  Housing:  In what kind of housing do you live? House/apt/trailer/shelter? apartment  Do you rent/pay a mortgage/own? rent  Do you live with anyone? self  Are you currently worried about losing your housing? no  Social:  What is your current marital status? single  Do you have any children? daughter  Do you have family or friends who live locally? Dtr, sister, fiance  Food:  No concerns- receives food stamps  Income:  What is your current source of income? SSDI  Insurance:  Are you currently insured? UHC Medicare, Medicaid  Do you have prescription coverage? yes   Transportation:  Do you have transportation to your medical appointments? Yes has her own car    Daily Health Needs: Do you have a working scale at home? yes  How do you manage your medications at home? Takes them out of the bottle  Do you have issues affording your medications? no  If yes, has this ever prevented you from obtaining medications? N/a  Do you have any concerns with mobility at home? Some weakness- was supposed to get home health after hospital DC but hasn't heard from them- CSW to follow up with Alvis Lemmings  Do you use any assistive devices at home or have PCS at home? Awaiting cane, walker, step for bed, and shower chair  Do you have a PCP? [place] you currently use tobacco products or have recently quit? Reports very infrequent use less than one cigarette a day- no need for resources  Are there any additional barriers you see to getting the care you need? no  CSW will continue to follow through paramedicine program and assist as needed.  Jorge Ny, LCSW Clinical Social Worker Advanced Heart Failure Clinic Desk#: [phone] Cell#: [phone]

## 2022-04-10 ENCOUNTER — Telehealth (HOSPITAL_COMMUNITY): Payer: Self-pay | Admitting: Emergency Medicine

## 2022-04-10 NOTE — Telephone Encounter (Signed)
Attempted call and got voice mail.  Left message with my name and number for her to call me back.  I will reach back out to her later today to schedule an initial paramedicine visit.

## 2022-04-12 ENCOUNTER — Telehealth (HOSPITAL_COMMUNITY): Payer: Self-pay | Admitting: Emergency Medicine

## 2022-04-12 NOTE — Telephone Encounter (Signed)
Called and got no answer.  LVM. Trying to schedule initial home visit.

## 2022-04-18 ENCOUNTER — Telehealth (HOSPITAL_COMMUNITY): Payer: Self-pay | Admitting: Emergency Medicine

## 2022-04-18 NOTE — Telephone Encounter (Signed)
Called to schedule a initial visit with Carol [name].  She request specifically next week.  Home visit scheduled Tues 8/5 @ 1:30.  She is currently working out of her pill bottles but would like to transition to pill box.

## 2022-04-24 ENCOUNTER — Other Ambulatory Visit (HOSPITAL_COMMUNITY): Payer: Self-pay | Admitting: Emergency Medicine

## 2022-04-24 NOTE — Progress Notes (Signed)
Paramedicine Encounter    Patient ID: Carol [name], female    DOB: Sep 09, 1960, 61 y.o.   MRN: [id number]   BP (!) 166/78 (BP Location: Left Arm, Patient Position: Sitting, Cuff Size: Normal)   Pulse 88   Resp 16   Wt 182 lb (82.6 kg)   BMI 31.24 kg/m  Weight yesterday-not taken Last visit weight- not taken  This initial home visit with pt found her to be A&O x 4, skin W&D w/ good color.  She reports to having multiple hospitalizations this year.  Based on review of pt's medications there's no way to tell for sure whether she's been compliant with same or not.   This visit lasted approx 1.75hr.  There were 3 large boxes of pill bottles and multiple bubble packs to sift through.  She says she has been taking them but with the amount of meds available I'm not sure.  Today we switched her over to a pill box.  And I reconciled same and reviewed with her how to properly use the box and the benefits of being able to monitor her compliance.  During the med review determined pt was still taking Humalog and Glimepiride which were both to be discontinued after her last discharge from the hospital.  Discussed this with patient and she states she understands to discontinue these meds at this time.  Pt denies chest pain or SOB.  Lung sounds clear and equal bilat.  No edema noted. Pt admits to recreational drug use.  She states she smoked crack on Saturday.  Also pt reports that she struggles with reading and needs guidance.  I reassured her that I would work with her to help insure her success and get her on track with her medications.  Also, I advised her that I will be providing her with nutritional information - food choices, how to read nutritional labels.  Next visit scheduled 9/12 @ 1:00.  Home visit complete.    [name], [place] 04/24/2022   Patient Care Team: Simona Huh, NP as PCP - General (Nurse Practitioner) Janina Mayo, MD as PCP - Cardiology  (Cardiology)  Patient Active Problem List   Diagnosis Date Noted   Acute on chronic diastolic (congestive) heart failure ([place]) 03/22/2022   COPD not affecting current episode of care [facility]) 03/22/2022   Class 2 obesity due to excess calories with body mass index (BMI) of 37.0 to 37.9 in adult 03/22/2022   CHF (congestive heart failure) ([place]) 03/14/2022   CKD (chronic kidney disease), stage IV ([place]) 03/10/2022   Hypertensive urgency 01/22/2022   Acute kidney injury superimposed on chronic kidney disease ([place]) 01/22/2022   Acute on chronic diastolic CHF (congestive heart failure) ([place]) 01/22/2022   Prolonged QT interval 01/22/2022   Elevated troponin 01/22/2022   Thyroid nodule 01/22/2022   Cocaine abuse ([place]) 01/02/2022   Diabetic hypoglycemia ([place]) 12/31/2021   CKD (chronic kidney disease) stage 4, GFR 15-29 ml/min ([place]) 12/31/2021   CAD in native artery 12/24/2021   Anxiety with depression 12/24/2021   Pure hypercholesterolemia    Colon cancer screening 02/18/2014   Diabetes mellitus ([place]) 01/29/2014   Essential hypertension 01/29/2014   Closed jaw fracture ([place]) 01/29/2014   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 Swallow whole., Disp: 30 tablet, Rfl: 5   atorvastatin (LIPITOR) 80 MG tablet, Take 1 tablet (80 mg total) by mouth daily., Disp: 30 tablet, Rfl: 5   budesonide-formoterol (SYMBICORT) 160-4.5 MCG/ACT inhaler, Inhale 2 puffs into the lungs every morning., Disp: , Rfl:    cetirizine (ZYRTEC) 10 MG tablet, Take 10 mg by mouth every morning., Disp: , Rfl:    clopidogrel (PLAVIX) 75 MG tablet, Take 1 tablet (75 mg total) by mouth daily., Disp: 30 tablet, Rfl: 5   gabapentin  (NEURONTIN) 100 MG capsule, Take 2 capsules (200 mg total) by mouth 2 (two) times daily., Disp: 60 capsule, Rfl: 0   hydrALAZINE (APRESOLINE) 25 MG tablet, Take 1 tablet (25 mg total) by mouth 3 (three) times daily., Disp: 90 tablet, Rfl: 3   hydrOXYzine (ATARAX) 50 MG tablet, Take 50 mg by mouth daily as needed for anxiety or itching., Disp: , Rfl:    insulin glargine (LANTUS) 100 UNIT/ML Solostar Pen, Inject 5 Units into the skin at bedtime., Disp: , Rfl:    isosorbide mononitrate (IMDUR) 30 MG 24 hr tablet, Take 0.5 tablets (15 mg total) by mouth daily., Disp: , Rfl:    Nebivolol HCl 20 MG TABS, Take 20 mg by mouth every morning., Disp: , Rfl:    nitroGLYCERIN (NITROSTAT) 0.4 MG SL tablet, Place 0.4 mg under the tongue every 5 (five) minutes as needed for chest pain., Disp: , Rfl:    pantoprazole (PROTONIX) 40 MG tablet, Take 1 tablet (40 mg total) by mouth daily at 12 noon., Disp: 30 tablet, Rfl: 0   potassium chloride (KLOR-CON) 10 MEQ tablet, Take 1 tablet (10 mEq total) by mouth daily., Disp: 30 tablet, Rfl: 3   QUEtiapine (SEROQUEL XR) 300 MG 24 hr tablet, Take 300 mg by mouth at bedtime., Disp: , Rfl:    sertraline (ZOLOFT) 50 MG tablet, Take 50 mg by mouth every evening., Disp: , Rfl:    torsemide (DEMADEX) 20 MG tablet, Take 1 tablet (20 mg total) by mouth daily., Disp: 30 tablet, Rfl: 2   ergocalciferol (VITAMIN D2) 1.25 MG (50000 UT) capsule, Take 50,000 Units by mouth once a week. Wednesday, Disp: , Rfl:    HYDROcodone-acetaminophen (NORCO/VICODIN) 5-325 MG tablet, Take 1 tablet by mouth every 6 (six) hours as needed for moderate pain or severe pain. (Patient not taking: Reported on 04/24/2022), Disp: 30 tablet, Rfl: 0   triamcinolone ointment (KENALOG) 0.1 %, Apply 1 application. topically 2 (two) times daily. (Patient not taking: Reported on 04/24/2022), Disp: , Rfl:  Allergies  Allergen Reactions   Penicillins Shortness Of Breath and Other (See Comments)    Caused yeast infection Has  patient had a PCN reaction causing immediate rash, facial/tongue/throat swelling, SOB or lightheadedness with hypotension: Yes Has patient had a PCN reaction causing severe rash involving mucus membranes or skin necrosis: No Has patient had a PCN reaction that required hospitalization pt was in the hospital at the time of the reaction Has patient had a PCN reaction occurring within the last 10 years: No If all of the above answers are "NO", then may proceed with Cephalosp   Ultram [Tramadol] Other (See Comments)    Made her tongue raw      Social History   Socioeconomic History   Marital status: Single    Spouse name: Not on file   Number of children: 3   Years of education:  Not on file   Highest education level: Not on file  Occupational History   Not on file  Tobacco Use   Smoking status: Some Days    Packs/day: 0.25    Years: 3.00    Total pack years: 0.75    Types: Cigarettes   Smokeless tobacco: Never  Vaping Use   Vaping Use: Never used  Substance and Sexual Activity   Alcohol use: Yes    Alcohol/week: 0.0 standard drinks of alcohol    Comment: occasional   Drug use: No    Comment: none for 13 years   Sexual activity: Not on file  Other Topics Concern   Not on file  Social History Narrative   Not on file   Social Determinants of Health   Financial Resource Strain: Low Risk  (04/04/2022)   Overall Financial Resource Strain (CARDIA)    Difficulty of Paying Living Expenses: Not very hard  Food Insecurity: No Food Insecurity (04/04/2022)   Hunger Vital Sign    Worried About Running Out of Food in the Last Year: Never true    Ran Out of Food in the Last Year: Never true  Transportation Needs: No Transportation Needs (04/04/2022)   PRAPARE - Hydrologist (Medical): No    Lack of Transportation (Non-Medical): No  Physical Activity: Not on file  Stress: Not on file  Social Connections: Not on file  Intimate Partner Violence: Not on file     Physical Exam      Future Appointments  Date Time Provider [place]  04/25/2022  2:00 PM [place] None  05/10/2022  2:00 PM Hunsucker, Bonna Gains, MD [facility] None  05/16/2022  2:00 PM [facility] PA/NP [facility] None  06/20/2022  2:00 PM Bensimhon, Shaune Pascal, MD [facility] None  06/29/2022  9:00 AM [facility] 1 [facility] [place], [place] [facility] Maillet Paramedic  04/24/22

## 2022-04-25 ENCOUNTER — Other Ambulatory Visit (HOSPITAL_COMMUNITY): Payer: Self-pay | Admitting: Emergency Medicine

## 2022-04-25 ENCOUNTER — Ambulatory Visit (HOSPITAL_COMMUNITY)
Admission: RE | Admit: 2022-04-25 | Discharge: 2022-04-25 | Disposition: A | Discharge status: Home / Self Care | Payer: Medicare Other | Source: Ambulatory Visit | Attending: Adult Health | Admitting: Adult Health

## 2022-04-25 ENCOUNTER — Telehealth (HOSPITAL_COMMUNITY): Payer: Self-pay | Admitting: *Deleted

## 2022-04-25 ENCOUNTER — Encounter (HOSPITAL_COMMUNITY): Payer: Self-pay

## 2022-04-25 VITALS — BP 104/60 | HR 66 | Wt 185.4 lb

## 2022-04-25 DIAGNOSIS — I130 Hypertensive heart and chronic kidney disease with heart failure and stage 1 through stage 4 chronic kidney disease, or unspecified chronic kidney disease: Secondary | ICD-10-CM | POA: Diagnosis not present

## 2022-04-25 DIAGNOSIS — I252 Old myocardial infarction: Secondary | ICD-10-CM | POA: Insufficient documentation

## 2022-04-25 DIAGNOSIS — Z7902 Long term (current) use of antithrombotics/antiplatelets: Secondary | ICD-10-CM | POA: Diagnosis not present

## 2022-04-25 DIAGNOSIS — I5032 Chronic diastolic (congestive) heart failure: Secondary | ICD-10-CM

## 2022-04-25 DIAGNOSIS — E1122 Type 2 diabetes mellitus with diabetic chronic kidney disease: Secondary | ICD-10-CM | POA: Diagnosis not present

## 2022-04-25 DIAGNOSIS — F1910 Other psychoactive substance abuse, uncomplicated: Secondary | ICD-10-CM

## 2022-04-25 DIAGNOSIS — I160 Hypertensive urgency: Secondary | ICD-10-CM | POA: Insufficient documentation

## 2022-04-25 DIAGNOSIS — I472 Ventricular tachycardia, unspecified: Secondary | ICD-10-CM | POA: Insufficient documentation

## 2022-04-25 DIAGNOSIS — N184 Chronic kidney disease, stage 4 (severe): Secondary | ICD-10-CM | POA: Diagnosis not present

## 2022-04-25 DIAGNOSIS — I2510 Atherosclerotic heart disease of native coronary artery without angina pectoris: Secondary | ICD-10-CM

## 2022-04-25 DIAGNOSIS — D869 Sarcoidosis, unspecified: Secondary | ICD-10-CM | POA: Insufficient documentation

## 2022-04-25 DIAGNOSIS — Z7982 Long term (current) use of aspirin: Secondary | ICD-10-CM | POA: Diagnosis not present

## 2022-04-25 DIAGNOSIS — I4729 Other ventricular tachycardia: Secondary | ICD-10-CM | POA: Diagnosis not present

## 2022-04-25 DIAGNOSIS — E785 Hyperlipidemia, unspecified: Secondary | ICD-10-CM | POA: Insufficient documentation

## 2022-04-25 DIAGNOSIS — R002 Palpitations: Secondary | ICD-10-CM | POA: Diagnosis not present

## 2022-04-25 DIAGNOSIS — Z79899 Other long term (current) drug therapy: Secondary | ICD-10-CM | POA: Insufficient documentation

## 2022-04-25 LAB — BASIC METABOLIC PANEL
Anion gap: 11 (ref 5–15)
BUN: 47 mg/dL — ABNORMAL HIGH (ref 6–20)
CO2: 22 mmol/L (ref 22–32)
Calcium: 9.2 mg/dL (ref 8.9–10.3)
Chloride: 105 mmol/L (ref 98–111)
Creatinine, Ser: 3.48 mg/dL — ABNORMAL HIGH (ref 0.44–1.00)
GFR, Estimated: 15 mL/min — ABNORMAL LOW (ref >60)
Glucose, Bld: 154 mg/dL — ABNORMAL HIGH (ref 70–99)
Potassium: 4.8 mmol/L (ref 3.5–5.1)
Sodium: 138 mmol/L (ref 135–145)

## 2022-04-25 LAB — BRAIN NATRIURETIC PEPTIDE: B Natriuretic Peptide: 23.7 pg/mL (ref 0.0–100.0)

## 2022-04-25 MED ORDER — TORSEMIDE 20 MG PO TABS: 10.0000 mg | ORAL_TABLET | Freq: Every day | ORAL | 3 refills | Status: DC

## 2022-04-25 MED ORDER — TORSEMIDE 20 MG PO TABS: 20.0000 mg | ORAL_TABLET | Freq: Every day | ORAL | 3 refills | Status: DC

## 2022-04-25 NOTE — Patient Instructions (Signed)
Medication Changes:  STOP potassium chloride.   HOLD torsemide for two days. 9/7 + 9/8 (Thursday and Friday).  RESTART torsemide SATURDAY 9/9 at 10mg , daily.   Lab Work:  Labs done today, we will contact you for abnormal readings.  Testing/Procedures:  None.  Referrals:  You have been referred to the Advanced Heart Failure clinic. See below for your next follow up.   Special Instructions // Education:  Do the following things EVERYDAY: Weigh yourself in the morning before breakfast. Write it down and keep it in a log. Take your medicines as prescribed Eat low salt foods--Limit salt (sodium) to 2000 mg per day.  Stay as active as you can everyday Limit all fluids for the day to less than 2 liters Continue to work for substance cessation.   Follow-Up in: 9/27 @ 2pm    At the [facility], you and your health needs are our priority. We have a designated team specialized in the treatment of Heart Failure. This Care Team includes your primary Heart Failure Specialized Cardiologist (physician), Advanced Practice Providers (APPs- Physician Assistants and Nurse Practitioners), and Pharmacist who all work together to provide you with the care you need, when you need it.   You may see any of the following providers on your designated Care Team at your next follow up:  Dr. Glori Bickers Dr. Loralie Champagne Dr. Roxana Hires, NP Lyda Jester, [place] [facility], [place] Forestine Na, NP Audry Riles, PharmD   Please be sure to bring in all your medications bottles to every appointment.   Need to Contact us:  If you have any questions or concerns before your next appointment please send [place] a message through [place] or call our office at [phone].    TO LEAVE A MESSAGE FOR THE NURSE SELECT OPTION 2, PLEASE LEAVE A MESSAGE INCLUDING: YOUR NAME DATE OF BIRTH CALL BACK NUMBER REASON FOR CALL**this is important as we prioritize  the call backs  YOU WILL RECEIVE A CALL BACK THE SAME DAY AS LONG AS YOU CALL BEFORE 4:00 PM

## 2022-04-25 NOTE — Progress Notes (Signed)
HEART IMPACT TRANSITIONS OF CARE    PCP: Coralie Carpen  NP  Primary Cardiologist: Dr Phineas Inches  HPI: 61 y.o. female with history of HTN, HLD, DM, crack/cocaine use, ETOH, tobacco abuse, CKD III, hx of sarcoidosis involving liver (previously followed by Hepatology at [facility], no-showed follow-ups). Limited reading ability.    Admitted in April 2023 with NSTEMI and uncontrolled HTN. Echo: EF 60-65%, mild LVH, grade I DD, RV okay. LHC demonstrated occluded small subranch off right PDA (treated medically), mild disease in LAD and RCA. Developed AKI post cath with Scr peaking at 3.2.    Readmitted in May 2023 with episodes of hypoglycemia. Received IV fluids. Developed volume overload and was diuresed. BP meds adjusted d/t hypertensive urgency.   Readmitted 06/23 with CP and hypertensive emergency. Minimally elevated HS troponin with flat trend. CTA chest negative for aortic dissection. CP atypical for angina. UDS + cocaine. Had been using crack cocaine since April d/t stress and using ibuprofen daily. She was volume overloaded and diuresed.BP rapidly corrected and became hypotensive. Had recurrent AKI and required iHD. Renal function improved enough to remove HD cath prior to discharge.     Admitted again 07/23 with decreased responsiveness and hypoglycemia. Had not been taking medications at home. She was volume overloaded and diuresised with IV lasix. Transitioned to torsemide 20 mg daily. Scr upper 2s - 3s during admission.   Unfortunately admitted again 08/03-08/08/23 with a/c CHF and recent cocaine use.  She was seen in [facility] clinic 04/04/22. Volume overloaded. Torsemide was increased 40 mg x4 days then 20 torsemide. Hydralazine was increased to 25 mg three times a day. Referred to HF Clinic and HF Paramedicine.    Today she returns for HF follow up with her fiance. Complaining of nausea  and dizziness. SOB with exertion. Denies PND/Orthopnea. Appetite ok. No fever or chills. Weight at  home 180  pounds. Smoked crack on Saturday. Smoking crack 3 times a month. Says she tries not to smoke but has craving. Talks to a sponsor a few times a week. Smokes 1 cigarette per day.  Followed by HF Paramedicine and was seen 04/24/22. During the visit she had multiple pills and pill packs. Pill box was set up. Question about how she is taking her medications. Lives alone.   Cardiac Test 12/2021 Echo: EF 60-65%, mild LVH, grade I DD, RV okay.   12/2021 LHC demonstrated occluded small subranch off right PDA (treated medically), mild disease in LAD and RCA.    ROS: All systems negative except as listed in HPI, PMH and Problem List.  SH:  Social History   Socioeconomic History   Marital status: Single    Spouse name: Not on file   Number of children: 3   Years of education: Not on file   Highest education level: Not on file  Occupational History   Not on file  Tobacco Use   Smoking status: Some Days    Packs/day: 0.25    Years: 3.00    Total pack years: 0.75    Types: Cigarettes   Smokeless tobacco: Never  Vaping Use   Vaping Use: Never used  Substance and Sexual Activity   Alcohol use: Yes    Alcohol/week: 0.0 standard drinks of alcohol    Comment: occasional   Drug use: No    Comment: none for 13 years   Sexual activity: Not on file  Other Topics Concern   Not on file  Social History Narrative  Not on file   Social Determinants of Health   Financial Resource Strain: Low Risk  (04/04/2022)   Overall Financial Resource Strain (CARDIA)    Difficulty of Paying Living Expenses: Not very hard  Food Insecurity: No Food Insecurity (04/04/2022)   Hunger Vital Sign    Worried About Running Out of Food in the Last Year: Never true    Ran Out of Food in the Last Year: Never true  Transportation Needs: No Transportation Needs (04/04/2022)   PRAPARE - Hydrologist (Medical): No    Lack of Transportation (Non-Medical): No  Physical Activity: Not on  file  Stress: Not on file  Social Connections: Not on file  Intimate Partner Violence: Not on file    FH:  Family History  Problem Relation Age of Onset   Diabetes Mother    Kidney disease Mother    Seizures Father    Hypertension Father    Stroke Father    Asthma Other     Past Medical History:  Diagnosis Date   Abnormal liver function tests    Acute kidney injury ([place]) 01/2014   Hospitalized, Volume depletion, nausea and vomiting   Anxiety    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     aspirin EC 81 MG EC tablet Take 1 tablet (81 mg total) by mouth daily. Swallow whole. 30 tablet 5   atorvastatin (LIPITOR) 80 MG tablet Take 1 tablet (80 mg total) by mouth daily. 30 tablet 5   budesonide-formoterol (SYMBICORT) 160-4.5 MCG/ACT inhaler Inhale 2 puffs into the lungs every morning.     cetirizine (ZYRTEC) 10 MG tablet Take 10 mg by mouth every morning.     clopidogrel (PLAVIX) 75 MG tablet Take 1 tablet (75 mg total) by mouth daily. 30 tablet 5   ergocalciferol (VITAMIN D2) 1.25 MG (50000 UT) capsule Take 50,000 Units by mouth once a week. Wednesday     gabapentin (NEURONTIN) 100 MG capsule Take 2 capsules (200 mg total) by mouth 2 (two) times daily. 60 capsule 0   hydrALAZINE (APRESOLINE) 25 MG tablet Take 1 tablet (25 mg total) by mouth 3 (three) times daily. 90 tablet 3   HYDROcodone-acetaminophen (NORCO/VICODIN) 5-325 MG tablet Take 1 tablet by mouth every 6 (six) hours as needed for moderate  pain or severe pain. 30 tablet 0   hydrOXYzine (ATARAX) 50 MG tablet Take 50 mg by mouth daily as needed for anxiety or itching.     insulin glargine (LANTUS) 100 UNIT/ML Solostar Pen Inject 5 Units into the skin at bedtime.     isosorbide mononitrate (IMDUR) 30 MG 24 hr tablet Take 0.5 tablets (15 mg total) by mouth daily.     Nebivolol HCl 20 MG TABS Take 20 mg by mouth every morning.     nitroGLYCERIN (NITROSTAT) 0.4 MG SL tablet Place 0.4 mg under the tongue every 5 (five) minutes as needed for chest pain.     pantoprazole (PROTONIX)  40 MG tablet Take 1 tablet (40 mg total) by mouth daily at 12 noon. 30 tablet 0   QUEtiapine (SEROQUEL XR) 300 MG 24 hr tablet Take 300 mg by mouth at bedtime.     sertraline (ZOLOFT) 50 MG tablet Take 50 mg by mouth every evening.     torsemide (DEMADEX) 20 MG tablet Take 1 tablet (20 mg total) by mouth daily. 30 tablet 3   triamcinolone ointment (KENALOG) 0.1 % Apply 1 application  topically 2 (two) times daily.     No current facility-administered medications for this encounter.    Vitals:   04/25/22 0235 04/25/22 1353 04/25/22 1434 04/25/22 1439  BP:  104/60    Pulse:  66    SpO2: 97% 97% 98% 97%  Weight:  84.1 kg (185 lb 6.4 oz)     Wt Readings from Last 3 Encounters:  04/25/22 84.1 kg (185 lb 6.4 oz)  04/24/22 82.6 kg (182 lb)  04/04/22 86 kg (189 lb 9.6 oz)   Sitting 108/84  Standing  88/52  PHYSICAL EXAM: General:  Walked in the clinic . No resp difficulty HEENT: normal Neck: supple. JVP flat. Carotids 2+ bilaterally; no bruits. No lymphadenopathy or thryomegaly appreciated. Cor: PMI normal. Regular rate & rhythm. No rubs, gallops or murmurs. Lungs: clear Abdomen: soft, nontender, nondistended. No hepatosplenomegaly. No bruits or masses. Good bowel sounds. Extremities: no cyanosis, clubbing, rash, edema Neuro: alert & orientedx3, cranial nerves grossly intact. Moves all 4 extremities w/o difficulty. Affect pleasant.   ECG:SR 19  personally checked.    ASSESSMENT & PLAN:  1. HFpEF -Echo 01/2014: EF 65%, moderate LVH, RV okay -Echo 05/23: EF 60-65%, mild LVH, grade I DD, RV okay, mild LAE -Multiple admissions over the last 6 months with a/c CHF, frequently in setting of hypertensive emergency, cocaine use and medication noncompliance -She does have history of sarcoidosis of liver. Has recurrent CHF exacerbations and runs of NSVT noted on tele during recent admits. Need to rule out cardiac sarcoidosis. CMRI set up 06/2022. If abnormal, will need cardiac PET. EKG today --. SR. .  -NYHA III. Reds Clip 38% . + Orthostatic. Hold torsemide x 2 days then cut back torsemide 10 mg daily. Stop potassium.  -GDMT limited by CKD. No ARNi/ARB or MRA - Continue hydralazine 25 mg three times a day and imdur 15 mg daily  - Check BMET/BNP   2. Hypertension -Controlled continue current regimen.    3. CAD -NSTEMI April 2023. LHC with occluded small subranch of PDA (treated medically) -On aspirin, plavix, statin and beta blocker No chest pain.    4. CKD IV -Multiple recurrent AKIs recently. Briefly required HD in June.  -New Scr baseline seems to be around 2.5 -Has f/u with nephrology    5. Substance abuse -Continues to use crack cocaine 3-4 times a months. -Recommended complete cessation   6. Sarcoidosis of liver -Previously followed by hepatology at [facility]. No showed subsequent follow-ups.  -Ruling out cardiac sarcoid as above -CT chest 06/23 with small mediastinal and paratracheal nodes, mild subpleural reticular interstitial changes.  - Referred to pulmonary last visit.   7. NSVT -Wore Zio for 2 days. Asked her to mail in [place] bisoprolol  8. Limited Reading Ability Finished 12 grade but didn't pass test  so she did not receive diploma. She was in special needs classes. Need to carefully adjust medications.   Follow up  later this month in [facility].    F/U the end  of the month in APP.   Discussed with HF Paramedic the above medication changes.   Taiwo Fish NP-C  3:14 PM

## 2022-04-25 NOTE — Progress Notes (Signed)
Updated community paramedicine, [place] with medication changes and follow up appointments for this month. She will assist to fill pill box organizer accordingly.

## 2022-04-25 NOTE — Progress Notes (Signed)
Medication review and reconciliation only. No vitals taken this visit. Pt w/ med changes today per Amy Clegg= holding Torsemide  2 days then 10mg . Torsemide daily starting 9/9.  She is also discontinuing Potassium.  I did a home visit yesterday and took all her bubble packs and deconstructed them.  This afternoon I put the medications back in their corresponding bottles.  Reviewed med changes again with patient and she states she understands same.  Home visit scheduled for next Tuesday. Home visit complete.    [name], [place] 04/25/2022

## 2022-04-25 NOTE — Telephone Encounter (Signed)
Heart Failure Nurse Navigator Progress Note   Unable to leave appointment reminder message for 04/25/22 @ 2 pm.    Earnestine Leys, BSN, RN Heart Failure Leisure centre manager Chat Only

## 2022-04-25 NOTE — Progress Notes (Signed)
ReDS Vest / Clip - 04/25/22 1400       ReDS Vest / Clip   Station Marker A    Ruler Value 27.5    ReDS Value Range Moderate volume overload    ReDS Actual Value 38

## 2022-05-01 ENCOUNTER — Other Ambulatory Visit (HOSPITAL_COMMUNITY): Payer: Self-pay | Admitting: Emergency Medicine

## 2022-05-01 NOTE — Progress Notes (Signed)
Paramedicine Encounter    Patient ID: Carol [name], female    DOB: 02/25/1961, 61 y.o.   MRN: [id number]   There were no vitals taken for this visit. Weight yesterday-not taken Last visit weight-182lb  ATF Ms. [place] A&O x 4, skin W&D w/ good color.  Pt says she's had some SOB w/ exertion and has a full sensation.  Lung sounds are clear throughout.  Minimal pedal edema.  She has a lot of swelling to her face and she says this happens when she's holding fluid.  At her last clinic visit her Torsemide was held for 2 days and then went to 10mg . Torsemide daily and discontinued her Potassium.  Today she is up 8lbs.  Contacted clinic to get med changes.  Per [facility] pt. Is to take 20mg  Torsemide every other day alternating with 10mg  Torsemide.  Pill box reconciled without incident.  Called in refill to [facility] for Vit D2.  New prescription is required at this time and pharm will reach out to the doctor for same. Home visit complete.   Patient Care Team: Simona Huh, NP as PCP - General (Nurse Practitioner) Janina Mayo, MD as PCP - Cardiology (Cardiology)  Patient Active Problem List   Diagnosis Date Noted   Acute on chronic diastolic (congestive) heart failure ([place]) 03/22/2022   COPD not affecting current episode of care [facility]) 03/22/2022   Class 2 obesity due to excess calories with body mass index (BMI) of 37.0 to 37.9 in adult 03/22/2022   CHF (congestive heart failure) ([place]) 03/14/2022   CKD (chronic kidney disease), stage IV ([place]) 03/10/2022   Hypertensive urgency 01/22/2022   Acute kidney injury superimposed on chronic kidney disease ([place]) 01/22/2022   Acute on chronic diastolic CHF (congestive heart failure) ([place]) 01/22/2022   Prolonged QT interval 01/22/2022   Elevated troponin 01/22/2022   Thyroid nodule 01/22/2022   Cocaine abuse ([place]) 01/02/2022   Diabetic hypoglycemia ([place]) 12/31/2021   CKD (chronic kidney disease) stage 4, GFR 15-29 ml/min ([place])  12/31/2021   CAD in native artery 12/24/2021   Anxiety with depression 12/24/2021   Pure hypercholesterolemia    Colon cancer screening 02/18/2014   Diabetes mellitus ([place]) 01/29/2014   Essential hypertension 01/29/2014   Closed jaw fracture ([place]) 01/29/2014   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 Swallow whole., Disp: 30 tablet, Rfl: 5   atorvastatin (LIPITOR) 80 MG tablet, Take 1 tablet (80 mg total) by mouth daily., Disp: 30 tablet, Rfl: 5   budesonide-formoterol (SYMBICORT) 160-4.5 MCG/ACT inhaler, Inhale 2 puffs into the lungs every morning., Disp: , Rfl:    cetirizine (ZYRTEC) 10 MG tablet, Take 10 mg by mouth every morning., Disp: , Rfl:    clopidogrel (PLAVIX) 75 MG tablet, Take 1 tablet (75 mg total) by mouth daily., Disp: 30 tablet, Rfl: 5   ergocalciferol (VITAMIN D2) 1.25 MG (50000 UT) capsule, Take 50,000 Units by mouth once a week. Wednesday, Disp: , Rfl:    gabapentin (NEURONTIN) 100 MG capsule, Take 2 capsules (200 mg total) by mouth 2 (two) times daily., Disp: 60 capsule, Rfl: 0   hydrALAZINE (APRESOLINE) 25 MG tablet, Take 1 tablet (25  mg total) by mouth 3 (three) times daily., Disp: 90 tablet, Rfl: 3   HYDROcodone-acetaminophen (NORCO/VICODIN) 5-325 MG tablet, Take 1 tablet by mouth every 6 (six) hours as needed for moderate pain or severe pain. (Patient not taking: Reported on 04/25/2022), Disp: 30 tablet, Rfl: 0   hydrOXYzine (ATARAX) 50 MG tablet, Take 50 mg by mouth daily as needed for anxiety or itching., Disp: , Rfl:    insulin glargine (LANTUS) 100 UNIT/ML Solostar Pen, Inject 5 Units into the skin at bedtime., Disp: , Rfl:    isosorbide mononitrate (IMDUR) 30 MG 24  hr tablet, Take 0.5 tablets (15 mg total) by mouth daily., Disp: , Rfl:    Nebivolol HCl 20 MG TABS, Take 20 mg by mouth every morning., Disp: , Rfl:    nitroGLYCERIN (NITROSTAT) 0.4 MG SL tablet, Place 0.4 mg under the tongue every 5 (five) minutes as needed for chest pain., Disp: , Rfl:    pantoprazole (PROTONIX) 40 MG tablet, Take 1 tablet (40 mg total) by mouth daily at 12 noon., Disp: 30 tablet, Rfl: 0   QUEtiapine (SEROQUEL XR) 300 MG 24 hr tablet, Take 300 mg by mouth at bedtime., Disp: , Rfl:    sertraline (ZOLOFT) 50 MG tablet, Take 50 mg by mouth every evening., Disp: , Rfl:    torsemide (DEMADEX) 20 MG tablet, Take 0.5 tablets (10 mg total) by mouth daily. RESTART on Saturday 04/28/22., Disp: 15 tablet, Rfl: 3   triamcinolone ointment (KENALOG) 0.1 %, Apply 1 application  topically 2 (two) times daily. (Patient not taking: Reported on 04/25/2022), Disp: , Rfl:  Allergies  Allergen Reactions   Penicillins Shortness Of Breath and Other (See Comments)    Caused yeast infection Has patient had a PCN reaction causing immediate rash, facial/tongue/throat swelling, SOB or lightheadedness with hypotension: Yes Has patient had a PCN reaction causing severe rash involving mucus membranes or skin necrosis: No Has patient had a PCN reaction that required hospitalization pt was in the hospital at the time of the reaction Has patient had a PCN reaction occurring within the last 10 years: No If all of the above answers are "NO", then may proceed with Cephalosp   Ultram [Tramadol] Other (See Comments)    Made her tongue raw      Social History   Socioeconomic History   Marital status: Single    Spouse name: Not on file   Number of children: 3   Years of education: Not on file   Highest education level: Not on file  Occupational History   Not on file  Tobacco Use   Smoking status: Some Days    Packs/day: 0.25    Years: 3.00    Total pack years: 0.75    Types: Cigarettes   Smokeless  tobacco: Never  Vaping Use   Vaping Use: Never used  Substance and Sexual Activity   Alcohol use: Yes    Alcohol/week: 0.0 standard drinks of alcohol    Comment: occasional   Drug use: No    Comment: none for 13 years   Sexual activity: Not on file  Other Topics Concern   Not on file  Social History Narrative   Not on file   Social Determinants of Health   Financial Resource Strain: Low Risk  (04/04/2022)   Overall Financial Resource Strain (CARDIA)    Difficulty of Paying Living Expenses: Not very hard  Food Insecurity: No Food Insecurity (04/04/2022)   Hunger Vital Sign    Worried About  Running Out of Food in the Last Year: Never true    [place] in the Last Year: Never true  Transportation Needs: No Transportation Needs (04/04/2022)   PRAPARE - Hydrologist (Medical): No    Lack of Transportation (Non-Medical): No  Physical Activity: Not on file  Stress: Not on file  Social Connections: Not on file  Intimate Partner Violence: Not on file    Physical Exam      Future Appointments  Date Time Provider [place]  05/10/2022  2:00 PM Hunsucker, Bonna Gains, MD [facility] None  05/16/2022  2:00 PM [facility] PA/NP [facility] None  06/20/2022  2:00 PM Bensimhon, Shaune Pascal, MD [facility] None  06/29/2022  9:00 AM [facility] 1 [facility] [place], [facility] Paramedic  05/01/22

## 2022-05-02 ENCOUNTER — Telehealth (HOSPITAL_COMMUNITY): Payer: Self-pay | Admitting: Emergency Medicine

## 2022-05-02 NOTE — Telephone Encounter (Signed)
Carol [name] called me to say she was experiencing "jerking to my whole body."  I was at the clinic during the call and spoke to [facility] in triage who confirmed that she should either reach out to her PCP or go to the ER.  Pt. Advised she would probably go to the ER.

## 2022-05-02 NOTE — Telephone Encounter (Signed)
Called Carol [name] to follow up and see how she was doing.  She said she was a little better and that she decided not to go to the hospital.  I advised her that if she did not want to go to the hospital she should at least reach out to her PCP in the morning and attempt to be evaluated. She advised she understood same.

## 2022-05-08 ENCOUNTER — Telehealth (HOSPITAL_COMMUNITY): Payer: Self-pay | Admitting: Emergency Medicine

## 2022-05-08 NOTE — Telephone Encounter (Signed)
Called [name] @ 4:15 this afternoon in an attempt to reschedule another visit as she had never called me back today as she said she would.  Unable to reach.  LVM    [name], [place] 05/08/2022

## 2022-05-08 NOTE — Telephone Encounter (Signed)
Received call from Carol [name] @ 11:07 today advising she would not be available for her scheduled 12:30 home visit today. She states she would not be home but would call me when she got home and I could come later.

## 2022-05-09 ENCOUNTER — Other Ambulatory Visit: Payer: Self-pay

## 2022-05-09 ENCOUNTER — Telehealth (HOSPITAL_COMMUNITY): Payer: Self-pay | Admitting: Emergency Medicine

## 2022-05-09 ENCOUNTER — Observation Stay (HOSPITAL_COMMUNITY)
Admission: EM | Admit: 2022-05-09 | Discharge: 2022-05-11 | Disposition: A | Discharge status: Home / Self Care | Payer: Medicare Other | Attending: Student in an Organized Health Care Education/Training Program | Admitting: Student in an Organized Health Care Education/Training Program

## 2022-05-09 ENCOUNTER — Encounter (HOSPITAL_COMMUNITY): Payer: Self-pay | Admitting: Emergency Medicine

## 2022-05-09 ENCOUNTER — Emergency Department (HOSPITAL_COMMUNITY): Payer: Medicare Other

## 2022-05-09 DIAGNOSIS — Z79899 Other long term (current) drug therapy: Secondary | ICD-10-CM | POA: Insufficient documentation

## 2022-05-09 DIAGNOSIS — N184 Chronic kidney disease, stage 4 (severe): Secondary | ICD-10-CM | POA: Diagnosis not present

## 2022-05-09 DIAGNOSIS — R0789 Other chest pain: Secondary | ICD-10-CM | POA: Diagnosis present

## 2022-05-09 DIAGNOSIS — E1169 Type 2 diabetes mellitus with other specified complication: Secondary | ICD-10-CM

## 2022-05-09 DIAGNOSIS — I2510 Atherosclerotic heart disease of native coronary artery without angina pectoris: Secondary | ICD-10-CM | POA: Diagnosis present

## 2022-05-09 DIAGNOSIS — E1122 Type 2 diabetes mellitus with diabetic chronic kidney disease: Secondary | ICD-10-CM | POA: Diagnosis not present

## 2022-05-09 DIAGNOSIS — I130 Hypertensive heart and chronic kidney disease with heart failure and stage 1 through stage 4 chronic kidney disease, or unspecified chronic kidney disease: Secondary | ICD-10-CM | POA: Diagnosis not present

## 2022-05-09 DIAGNOSIS — I5030 Unspecified diastolic (congestive) heart failure: Secondary | ICD-10-CM | POA: Insufficient documentation

## 2022-05-09 DIAGNOSIS — E7800 Pure hypercholesterolemia, unspecified: Secondary | ICD-10-CM | POA: Diagnosis not present

## 2022-05-09 DIAGNOSIS — I509 Heart failure, unspecified: Secondary | ICD-10-CM

## 2022-05-09 DIAGNOSIS — G20 Parkinson's disease: Secondary | ICD-10-CM | POA: Insufficient documentation

## 2022-05-09 DIAGNOSIS — Z794 Long term (current) use of insulin: Secondary | ICD-10-CM | POA: Insufficient documentation

## 2022-05-09 DIAGNOSIS — I10 Essential (primary) hypertension: Secondary | ICD-10-CM | POA: Diagnosis present

## 2022-05-09 DIAGNOSIS — J45901 Unspecified asthma with (acute) exacerbation: Secondary | ICD-10-CM | POA: Diagnosis not present

## 2022-05-09 DIAGNOSIS — I5033 Acute on chronic diastolic (congestive) heart failure: Secondary | ICD-10-CM | POA: Diagnosis not present

## 2022-05-09 DIAGNOSIS — Z7982 Long term (current) use of aspirin: Secondary | ICD-10-CM | POA: Insufficient documentation

## 2022-05-09 DIAGNOSIS — R079 Chest pain, unspecified: Principal | ICD-10-CM | POA: Insufficient documentation

## 2022-05-09 DIAGNOSIS — F17210 Nicotine dependence, cigarettes, uncomplicated: Secondary | ICD-10-CM | POA: Diagnosis not present

## 2022-05-09 DIAGNOSIS — Z7902 Long term (current) use of antithrombotics/antiplatelets: Secondary | ICD-10-CM | POA: Insufficient documentation

## 2022-05-09 DIAGNOSIS — E119 Type 2 diabetes mellitus without complications: Secondary | ICD-10-CM

## 2022-05-09 DIAGNOSIS — I1310 Hypertensive heart and chronic kidney disease without heart failure, with stage 1 through stage 4 chronic kidney disease, or unspecified chronic kidney disease: Secondary | ICD-10-CM

## 2022-05-09 DIAGNOSIS — E8729 Other acidosis: Secondary | ICD-10-CM | POA: Diagnosis present

## 2022-05-09 LAB — URINALYSIS, ROUTINE W REFLEX MICROSCOPIC
Bacteria, UA: NONE SEEN
Bilirubin Urine: NEGATIVE
Glucose, UA: NEGATIVE mg/dL
Hgb urine dipstick: NEGATIVE
Ketones, ur: NEGATIVE mg/dL
Leukocytes,Ua: NEGATIVE
Nitrite: NEGATIVE
Protein, ur: 100 mg/dL — AB
Specific Gravity, Urine: 1.006 (ref 1.005–1.030)
pH: 7 (ref 5.0–8.0)

## 2022-05-09 LAB — BASIC METABOLIC PANEL
Anion gap: 17 — ABNORMAL HIGH (ref 5–15)
Anion gap: 12 (ref 5–15)
BUN: 32 mg/dL — ABNORMAL HIGH (ref 6–20)
BUN: 33 mg/dL — ABNORMAL HIGH (ref 6–20)
CO2: 14 mmol/L — ABNORMAL LOW (ref 22–32)
CO2: 20 mmol/L — ABNORMAL LOW (ref 22–32)
Calcium: 9.3 mg/dL (ref 8.9–10.3)
Calcium: 9.5 mg/dL (ref 8.9–10.3)
Chloride: 103 mmol/L (ref 98–111)
Chloride: 104 mmol/L (ref 98–111)
Creatinine, Ser: 2.33 mg/dL — ABNORMAL HIGH (ref 0.44–1.00)
Creatinine, Ser: 2.32 mg/dL — ABNORMAL HIGH (ref 0.44–1.00)
GFR, Estimated: 24 mL/min — ABNORMAL LOW (ref >60)
GFR, Estimated: 24 mL/min — ABNORMAL LOW (ref >60)
Glucose, Bld: 154 mg/dL — ABNORMAL HIGH (ref 70–99)
Glucose, Bld: 95 mg/dL (ref 70–99)
Potassium: 4.3 mmol/L (ref 3.5–5.1)
Potassium: 4.2 mmol/L (ref 3.5–5.1)
Sodium: 134 mmol/L — ABNORMAL LOW (ref 135–145)
Sodium: 136 mmol/L (ref 135–145)

## 2022-05-09 LAB — I-STAT BETA HCG BLOOD, ED (MC, WL, AP ONLY): I-stat hCG, quantitative: <5 m[IU]/mL (ref <5)

## 2022-05-09 LAB — TROPONIN I (HIGH SENSITIVITY)
Troponin I (High Sensitivity): 14 ng/L (ref <18)
Troponin I (High Sensitivity): 15 ng/L (ref <18)

## 2022-05-09 LAB — RAPID URINE DRUG SCREEN, HOSP PERFORMED
Amphetamines: NOT DETECTED
Barbiturates: NOT DETECTED
Benzodiazepines: NOT DETECTED
Cocaine: POSITIVE — AB
Opiates: NOT DETECTED
Tetrahydrocannabinol: NOT DETECTED

## 2022-05-09 LAB — LIPID PANEL
Cholesterol: 161 mg/dL (ref 0–200)
HDL: 55 mg/dL (ref >40)
LDL Cholesterol: 67 mg/dL (ref 0–99)
Total CHOL/HDL Ratio: 2.9 RATIO
Triglycerides: 194 mg/dL — ABNORMAL HIGH (ref <150)
VLDL: 39 mg/dL (ref 0–40)

## 2022-05-09 LAB — CBC
HCT: 33.2 % — ABNORMAL LOW (ref 36.0–46.0)
Hemoglobin: 11 g/dL — ABNORMAL LOW (ref 12.0–15.0)
MCH: 27.5 pg (ref 26.0–34.0)
MCHC: 33.1 g/dL (ref 30.0–36.0)
MCV: 83 fL (ref 80.0–100.0)
Platelets: 265 10*3/uL (ref 150–400)
RBC: 4 MIL/uL (ref 3.87–5.11)
RDW: 16.1 % — ABNORMAL HIGH (ref 11.5–15.5)
WBC: 8.1 10*3/uL (ref 4.0–10.5)
nRBC: 0 % (ref 0.0–0.2)

## 2022-05-09 LAB — HEMOGLOBIN A1C
Hgb A1c MFr Bld: 6.5 % — ABNORMAL HIGH (ref 4.8–5.6)
Mean Plasma Glucose: 139.85 mg/dL

## 2022-05-09 LAB — GLUCOSE, CAPILLARY
Glucose-Capillary: 211 mg/dL — ABNORMAL HIGH (ref 70–99)
Glucose-Capillary: 104 mg/dL — ABNORMAL HIGH (ref 70–99)

## 2022-05-09 LAB — CBG MONITORING, ED: Glucose-Capillary: 101 mg/dL — ABNORMAL HIGH (ref 70–99)

## 2022-05-09 LAB — BRAIN NATRIURETIC PEPTIDE: B Natriuretic Peptide: 1111.4 pg/mL — ABNORMAL HIGH (ref 0.0–100.0)

## 2022-05-09 LAB — LACTIC ACID, PLASMA: Lactic Acid, Venous: 1.2 mmol/L (ref 0.5–1.9)

## 2022-05-09 MED ORDER — INSULIN ASPART 100 UNIT/ML IJ SOLN
0.0000 [IU] | Freq: Three times a day (TID) | INTRAMUSCULAR | Status: DC
  Administered 2022-05-09: 5 [IU] via SUBCUTANEOUS
  Administered 2022-05-10: 2 [IU] via SUBCUTANEOUS

## 2022-05-09 MED ORDER — FUROSEMIDE 10 MG/ML IJ SOLN
40.0000 mg | Freq: Once | INTRAMUSCULAR | Status: AC
  Administered 2022-05-09: 40 mg via INTRAVENOUS
  Filled 2022-05-09: qty 4

## 2022-05-09 MED ORDER — NITROGLYCERIN 0.4 MG SL SUBL: 0.4000 mg | SUBLINGUAL_TABLET | SUBLINGUAL | Status: DC | PRN

## 2022-05-09 MED ORDER — INSULIN GLARGINE-YFGN 100 UNIT/ML ~~LOC~~ SOLN: 15.0000 [IU] | Freq: Every day | SUBCUTANEOUS | Status: DC

## 2022-05-09 MED ORDER — SERTRALINE HCL 50 MG PO TABS
50.0000 mg | ORAL_TABLET | Freq: Every evening | ORAL | Status: DC
  Administered 2022-05-09 – 2022-05-10 (×2): 50 mg via ORAL
  Filled 2022-05-09 (×2): qty 1

## 2022-05-09 MED ORDER — OXYCODONE HCL 5 MG PO TABS
5.0000 mg | ORAL_TABLET | Freq: Four times a day (QID) | ORAL | Status: DC | PRN
  Administered 2022-05-09 – 2022-05-10 (×3): 5 mg via ORAL
  Filled 2022-05-09 (×3): qty 1

## 2022-05-09 MED ORDER — ACETAMINOPHEN 650 MG RE SUPP: 650.0000 mg | Freq: Four times a day (QID) | RECTAL | Status: DC | PRN

## 2022-05-09 MED ORDER — LORAZEPAM 2 MG/ML IJ SOLN
0.5000 mg | Freq: Once | INTRAMUSCULAR | Status: AC
  Administered 2022-05-09: 0.5 mg via INTRAVENOUS
  Filled 2022-05-09: qty 1

## 2022-05-09 MED ORDER — ISOSORBIDE MONONITRATE ER 30 MG PO TB24
15.0000 mg | ORAL_TABLET | Freq: Every day | ORAL | Status: DC
  Administered 2022-05-09 – 2022-05-11 (×3): 15 mg via ORAL
  Filled 2022-05-09 (×3): qty 1

## 2022-05-09 MED ORDER — ENOXAPARIN SODIUM 30 MG/0.3ML IJ SOSY
30.0000 mg | PREFILLED_SYRINGE | Freq: Every day | INTRAMUSCULAR | Status: DC
  Administered 2022-05-09 – 2022-05-11 (×3): 30 mg via SUBCUTANEOUS
  Filled 2022-05-09 (×3): qty 0.3

## 2022-05-09 MED ORDER — QUETIAPINE FUMARATE ER 300 MG PO TB24
300.0000 mg | ORAL_TABLET | Freq: Every day | ORAL | Status: DC
  Administered 2022-05-09 – 2022-05-10 (×2): 300 mg via ORAL
  Filled 2022-05-09 (×4): qty 1

## 2022-05-09 MED ORDER — CLOPIDOGREL BISULFATE 75 MG PO TABS
75.0000 mg | ORAL_TABLET | Freq: Every day | ORAL | Status: DC
  Administered 2022-05-09 – 2022-05-11 (×3): 75 mg via ORAL
  Filled 2022-05-09 (×3): qty 1

## 2022-05-09 MED ORDER — ATORVASTATIN CALCIUM 80 MG PO TABS
80.0000 mg | ORAL_TABLET | Freq: Every day | ORAL | Status: DC
  Administered 2022-05-09 – 2022-05-11 (×3): 80 mg via ORAL
  Filled 2022-05-09 (×2): qty 1
  Filled 2022-05-09: qty 2

## 2022-05-09 MED ORDER — NEBIVOLOL HCL 10 MG PO TABS
20.0000 mg | ORAL_TABLET | Freq: Every morning | ORAL | Status: DC
  Administered 2022-05-10 – 2022-05-11 (×2): 20 mg via ORAL
  Filled 2022-05-09 (×2): qty 2

## 2022-05-09 MED ORDER — ALBUTEROL SULFATE (2.5 MG/3ML) 0.083% IN NEBU: 3.0000 mL | INHALATION_SOLUTION | RESPIRATORY_TRACT | Status: DC | PRN

## 2022-05-09 MED ORDER — ACETAMINOPHEN 325 MG PO TABS
650.0000 mg | ORAL_TABLET | Freq: Four times a day (QID) | ORAL | Status: DC | PRN
  Administered 2022-05-09 – 2022-05-10 (×2): 650 mg via ORAL
  Filled 2022-05-09 (×2): qty 2

## 2022-05-09 MED ORDER — ASPIRIN 81 MG PO TBEC
81.0000 mg | DELAYED_RELEASE_TABLET | Freq: Every day | ORAL | Status: DC
  Administered 2022-05-09 – 2022-05-11 (×3): 81 mg via ORAL
  Filled 2022-05-09 (×3): qty 1

## 2022-05-09 MED ORDER — LIDOCAINE 5 % EX PTCH
1.0000 | MEDICATED_PATCH | CUTANEOUS | Status: DC
  Administered 2022-05-09 – 2022-05-10 (×2): 1 via TRANSDERMAL
  Filled 2022-05-09 (×2): qty 1

## 2022-05-09 MED ORDER — INSULIN GLARGINE-YFGN 100 UNIT/ML ~~LOC~~ SOLN
10.0000 [IU] | Freq: Every day | SUBCUTANEOUS | Status: DC
  Administered 2022-05-09 – 2022-05-10 (×2): 10 [IU] via SUBCUTANEOUS
  Filled 2022-05-09 (×4): qty 0.1

## 2022-05-09 MED ORDER — AMLODIPINE BESYLATE 10 MG PO TABS
10.0000 mg | ORAL_TABLET | Freq: Every day | ORAL | Status: DC
  Administered 2022-05-09 – 2022-05-11 (×3): 10 mg via ORAL
  Filled 2022-05-09: qty 1
  Filled 2022-05-09: qty 2
  Filled 2022-05-09: qty 1

## 2022-05-09 MED ORDER — MOMETASONE FURO-FORMOTEROL FUM 200-5 MCG/ACT IN AERO
2.0000 | INHALATION_SPRAY | Freq: Two times a day (BID) | RESPIRATORY_TRACT | Status: DC
  Administered 2022-05-10 – 2022-05-11 (×2): 2 via RESPIRATORY_TRACT
  Filled 2022-05-09 (×3): qty 8.8

## 2022-05-09 NOTE — ED Notes (Signed)
Pt reports missing $140 in a 20 dollar bills out of her purse. Assisted pt and provided belongings so she could search for money. Charge and security aware.

## 2022-05-09 NOTE — H&P (Signed)
Date: 05/09/2022               Patient Name:  Carol [name] MRN: [id number]  DOB: [birth year] Age / Sex: 61 y.o., female   PCP: Simona Huh, NP         Medical Service: Internal Medicine Teaching Service         Attending Physician: Dr. Evette Doffing, Mallie Mussel, *    First Contact: Dr. Claudia Desanctis Mapp Pager: [phone]  Second Contact: Dr. Iona Beard Pager: [phone]       After Hours (After 5p/  First Contact Pager: [phone]  weekends / holidays): Second Contact Pager: [phone]   Chief Concern: chest pain  History of Present Illness:   Carol [name] is a 61 year old female with past medical history of type 2 diabetes, hypertension, bipolar, CKD 4, CAD, NSTEMI in 12/2021 managed medically, HFpEF who presents to the emergency room for chest pain.  Chest pain started at 1 AM this morning while she was sleeping.  Pain localized in her left side that might radiate to her left neck.  Described pain as a tightness feeling.  She felt that this pain is similar to the last NSTEMI.  Chest pain is accompanied with dizziness, shortness of breath and anxiety.  Patient reports cocaine use and last use was the night before.  Said the pain has improved now.  Patient reports similar episodes 2 weeks ago when she was sweeping the floor.  Pain is better with rest.  Pain localized underneath her left breast, and lasts about 20 minutes.  Patient is not clear about her history but she may had similar chest pain like this every couple weeks that is exacerbated with exertion and better with rest. Patient denies any urinary or GI symptoms.  Patient was hospitalized for an NSTEMI in May 2023.  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  Echo showed normal LV function.  She was admitted in June for heart failure exacerbation from medication nonadherence, that required transient HD.  She was admitted in July for hypoglycemia and altered mental status, thought was due to taking  more Lantus than prescribed in combination with glipizide.  She was admitted again in August for chest pain and heart failure exacerbation.  Patient reports adherence to her medications.  She has an Therapist, sports who comes  every Tuesday and prepare her medications in a pillbox.  She is unsure of what diuretic she is taking, thought it was Lasix which she takes once a day.  She report normal urine output.  Denies worsening of LE edema but endorses 3 pillows orthopnea (unchanged) and subjective weight gain.  She thought her dry weight was 170 pounds.  She is currently using 30 units of Lantus daily and 50 units of NovoLog 3 times daily.  She denies any oral medication for diabetes.   In the ED, she was hypertensive, satting well on room air.  CBC unremarkable.  BMP showed stable CKD 3B with an AGMA.  Troponin negative.  BNP elevated 1111.  EKG did not show any acute changes and chest x-ray was unremarkable.  Patient was admitted for acute on chronic heart failure exacerbation.  Meds:  Current Meds  Medication Sig   albuterol (PROVENTIL HFA;VENTOLIN HFA) 108 (90 Base) MCG/ACT inhaler Inhale 2 puffs into the lungs every 4 (four) hours as needed for wheezing or shortness of breath.   aspirin EC 81 MG EC tablet Take 1 tablet (81 mg total) by mouth  daily. Swallow whole.   atorvastatin (LIPITOR) 80 MG tablet Take 1 tablet (80 mg total) by mouth daily.   budesonide-formoterol (SYMBICORT) 160-4.5 MCG/ACT inhaler Inhale 2 puffs into the lungs every morning.   cetirizine (ZYRTEC) 10 MG tablet Take 10 mg by mouth daily as needed for allergies.   clopidogrel (PLAVIX) 75 MG tablet Take 1 tablet (75 mg total) by mouth daily.   ergocalciferol (VITAMIN D2) 1.25 MG (50000 UT) capsule Take 50,000 Units by mouth once a week. Wednesday   gabapentin (NEURONTIN) 100 MG capsule Take 2 capsules (200 mg total) by mouth 2 (two) times daily.   glimepiride (AMARYL) 4 MG tablet Take 4 mg by mouth 2 (two) times daily.   hydrALAZINE  (APRESOLINE) 25 MG tablet Take 1 tablet (25 mg total) by mouth 3 (three) times daily.   hydrOXYzine (ATARAX) 50 MG tablet Take 50 mg by mouth daily as needed for anxiety or itching.   ibuprofen (ADVIL) 800 MG tablet Take 800 mg by mouth 3 (three) times daily.   insulin glargine (LANTUS) 100 UNIT/ML Solostar Pen Inject 5 Units into the skin at bedtime.   isosorbide mononitrate (IMDUR) 30 MG 24 hr tablet Take 0.5 tablets (15 mg total) by mouth daily. (Patient taking differently: Take 30 mg by mouth daily.)   methocarbamol (ROBAXIN) 750 MG tablet Take 750 mg by mouth 3 (three) times daily as needed for muscle spasms.   Nebivolol HCl 20 MG TABS Take 20 mg by mouth every morning.   nitroGLYCERIN (NITROSTAT) 0.4 MG SL tablet Place 0.4 mg under the tongue every 5 (five) minutes as needed for chest pain.   pantoprazole (PROTONIX) 40 MG tablet Take 1 tablet (40 mg total) by mouth daily at 12 noon.   potassium chloride (KLOR-CON M) 10 MEQ tablet Take 10 mEq by mouth daily.   QUEtiapine (SEROQUEL XR) 300 MG 24 hr tablet Take 300 mg by mouth at bedtime.   sertraline (ZOLOFT) 50 MG tablet Take 50 mg by mouth every evening.   torsemide (DEMADEX) 20 MG tablet Take 0.5 tablets (10 mg total) by mouth daily. RESTART on Saturday 04/28/22.     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  Independent with ADLs.  Currently on disability. -Occasional alcohol use -Occasional cigarette use -Endorses cocaine use, last use was last night.  States that she only used cocaine occasionally.  No other substance use -PCP: Dr. Coralie Carpen at [facility].  Last visit 1 month ago.  No medication  change.  Review of Systems: A complete ROS was negative except as per HPI.   Physical Exam: Blood pressure (!) 182/166, pulse 96, temperature 98 F (36.7 C), temperature source Oral, resp. rate (!) 27, SpO2 100 %. Physical Exam Constitutional:      General: She is not in acute distress.    Appearance: She is not ill-appearing.  Eyes:     General: No scleral icterus.       Right eye: No discharge.        Left eye: No discharge.     Conjunctiva/sclera: Conjunctivae normal.  Cardiovascular:     Rate and Rhythm: Normal rate and regular rhythm.     Pulses: Normal pulses.     Heart sounds: Normal heart sounds. No murmur heard.    Comments: No chest wall tenderness to palpation.  +1 LE edema bilaterally.  JVD to mid neck. Pulmonary:     Effort: Pulmonary effort is normal. No respiratory distress.     Breath sounds: Normal breath sounds. No wheezing.     Comments: Diminished breath sound heard bilateral lung bases.  No crackles. Abdominal:     General: Bowel sounds are normal. There is no distension.     Palpations: Abdomen is soft.     Tenderness: There is no abdominal tenderness. There is no guarding.  Musculoskeletal:     Comments: +1 edema bilateral lower extremity.  Bilateral feet well-perfused, +2 pedis pulses palpated.  Skin:    General: Skin is warm.     Coloration: Skin is not jaundiced.     Findings: No bruising.   Neurological:     General: No focal deficit present.     Mental Status: She is alert and oriented to person, place, and time.  Psychiatric:        Mood and Affect: Mood normal.        Behavior: Behavior normal.     EKG: personally reviewed my interpretation is normal sinus rhythm  CXR: personally reviewed my interpretation is unremarkable  Assessment & Plan by Problem: Principal Problem:   Acute exacerbation of CHF (congestive heart failure) (HCC) Active Problems:   Diabetes mellitus ([place])   Essential hypertension   Pure hypercholesterolemia   CAD in native artery   CKD (chronic kidney disease) stage 4, GFR 15-29 ml/min (HCC)   High anion gap metabolic acidosis   Chest pain  Carol [name] is a 61 year old female with past medical history of type 2 diabetes, hypertension, CKD 4, CAD, NSTEMI in 12/2021 managed medically, HFpEF who presents to the emergency room for chest pain, admitted for acute on chronic heart failure exacerbation.  Acute on chronic HFpEF exacerbation EF 60-65% in 12/2021.  History of NSTEMI managed medically.  Unclear if she is taking all of her medications.  She told the ED provider that she has been off of diuretics for the last week but she told me she has been taking them consistently.   Patient has evidence of mild HF exacerbation on exam including LE edema, JVD and elevated BNP.  Satting well on room air.  Per cardiology note, she is supposed to be taking torsemide 20 mg daily -s/p IV lasix 40 mg in ED. Monitor I/O -Daily weight -Resume beta-blocker.  No evidence of cardiogenic shock on exam -No ACE/ARB due to CKD 4  Atypical chest pain CAD ACS ruled out.  Her cocaine use can contribute to her symptoms, especially she used it last night.  I do not think we need any invasive  procedure at this time.  We will optimize her secondary risk factors.  I counseled patient extensively on cocaine use cessation. -Continue aspirin, Plavix, beta-blocker and  statin -Check A1c and lipid panel -Resume Imdur -Nitroglycerin PRN for chest pain -Follow-up with cardiology outpatient.  They are trying to get a cardiac MRI to rule out sarcoidosis given her history of liver sarcoidosis.  This may be challenging due to his CKD 4.  CKD 4 AGMA Kidney function is at baseline, GFR 24.  She noted to have [place].  No evidence of DKA.  Her BUN is not much more elevated than baseline but could contribute.  We will check lactic acid and UA for ketonuria.  Type 2 diabetes I am not sure how much insulin patient supposed to use.  Her med list said 5 units of Lantus daily but she is taking 30 units of Lantus daily with NovoLog. Unable to find her PCP online. -Check A1c -Semglee 10 units daily with sliding scale -May benefit from SGLT2  Hypertension -Resume amlodipine, Imdur, beta-blocker -Hold hydralazine for now.  Resume tomorrow if blood pressure still elevated  Bipolar disorder -Resume Seroquel and zoloft  Full code DVT: Lovenox IVF: N/A Diet: HH  Dispo: Admit patient to Observation with expected length of stay less than 2 midnights.  SignedGaylan Gerold, DO 05/09/2022, 11:12 AM  Pager: [phone] After 5pm on weekdays and 1pm on weekends: On Call pager: [phone]

## 2022-05-09 NOTE — ED Provider Notes (Signed)
[facility] EMERGENCY DEPARTMENT Provider Note   CSN: [id number] Arrival date & time: 05/09/22  3335     History  Chief Complaint  Patient presents with   Chest Pain    Carol [name] is a 61 y.o. female with a past medical history significant for insulin-dependent type 2 diabetes, hypertension, cocaine use, CKD stage IIIb, CAD with recent NSTEMI in May 2023 managed medically, chronic diastolic CHF, hyperlipidemia, GERD, anxiety, depression, and Parkinson's disease who presents to the ED due to left-sided chest pressure which started around 1 AM this morning.  Patient states chest pain woke her from her sleep.  She notes chest pain occasionally shoots across her chest.  Patient states pain feels similar to her previous NSTEMI.  Patient has a history of cocaine use.  Notes she used cocaine yesterday.  Patient endorses chronic shortness of breath however, denies change from baseline.  Denies nausea, dizziness, lightheadedness, and diaphoresis.  No fever or chills.  Patient states she has been off her diuretic for the past week which patient notes is because she was no longer fluid overloaded.  Patient states chest pain slightly improved after nitroglycerin given by EMS.  No history of blood clots, recent surgeries, recent long immobilizations, or hormonal treatments.  History obtained from patient and past medical records. No interpreter used during encounter.       Home Medications Prior to Admission medications   Medication Sig Start Date End Date Taking? Authorizing Provider  albuterol (PROVENTIL HFA;VENTOLIN HFA) 108 (90 Base) MCG/ACT inhaler Inhale 2 puffs into the lungs every 4 (four) hours as needed for wheezing or shortness of breath.   Yes [provider]  aspirin EC 81 MG EC tablet Take 1 tablet (81 mg total) by mouth daily. Swallow whole. 12/25/21  Yes Dunn, Dayna N, PA-C  atorvastatin (LIPITOR) 80 MG tablet Take 1 tablet (80 mg total) by mouth  daily. 12/25/21  Yes Dunn, Nedra Hai, PA-C  budesonide-formoterol (SYMBICORT) 160-4.5 MCG/ACT inhaler Inhale 2 puffs into the lungs every morning. 11/09/21  Yes [provider]  cetirizine (ZYRTEC) 10 MG tablet Take 10 mg by mouth daily as needed for allergies.   Yes [provider]  clopidogrel (PLAVIX) 75 MG tablet Take 1 tablet (75 mg total) by mouth daily. 12/25/21  Yes Dunn, Dayna N, PA-C  ergocalciferol (VITAMIN D2) 1.25 MG (50000 UT) capsule Take 50,000 Units by mouth once a week. Wednesday   Yes [provider]  gabapentin (NEURONTIN) 100 MG capsule Take 2 capsules (200 mg total) by mouth 2 (two) times daily. 03/27/22  Yes Domenic Polite, MD  glimepiride (AMARYL) 4 MG tablet Take 4 mg by mouth 2 (two) times daily. 04/04/22  Yes [provider]  hydrALAZINE (APRESOLINE) 25 MG tablet Take 1 tablet (25 mg total) by mouth 3 (three) times daily. 04/04/22  Yes Joette Catching, PA-C  hydrOXYzine (ATARAX) 50 MG tablet Take 50 mg by mouth daily as needed for anxiety or itching.   Yes [provider]  ibuprofen (ADVIL) 800 MG tablet Take 800 mg by mouth 3 (three) times daily. 05/01/22  Yes [provider]  insulin glargine (LANTUS) 100 UNIT/ML Solostar Pen Inject 5 Units into the skin at bedtime. 03/27/22  Yes Domenic Polite, MD  isosorbide mononitrate (IMDUR) 30 MG 24 hr tablet Take 0.5 tablets (15 mg total) by mouth daily. Patient taking differently: Take 30 mg by mouth daily. 03/27/22  Yes Domenic Polite, MD  methocarbamol (ROBAXIN) 750 MG tablet  Take 750 mg by mouth 3 (three) times daily as needed for muscle spasms. 03/28/22  Yes [provider]  Nebivolol HCl 20 MG TABS Take 20 mg by mouth every morning. 12/07/21  Yes [provider]  nitroGLYCERIN (NITROSTAT) 0.4 MG SL tablet Place 0.4 mg under the tongue every 5 (five) minutes as needed for chest pain.   Yes [provider]  pantoprazole (PROTONIX) 40 MG tablet Take 1 tablet  (40 mg total) by mouth daily at 12 noon. 02/02/14  Yes Regalado, Belkys A, MD  potassium chloride (KLOR-CON M) 10 MEQ tablet Take 10 mEq by mouth daily. 05/02/22  Yes [provider]  QUEtiapine (SEROQUEL XR) 300 MG 24 hr tablet Take 300 mg by mouth at bedtime. 12/07/21  Yes [provider]  sertraline (ZOLOFT) 50 MG tablet Take 50 mg by mouth every evening.   Yes [provider]  torsemide (DEMADEX) 20 MG tablet Take 0.5 tablets (10 mg total) by mouth daily. RESTART on Saturday 04/28/22. 04/25/22 07/24/22 Yes Clegg, Amy D, NP  amLODipine (NORVASC) 10 MG tablet Take 10 mg by mouth daily. 05/01/22   [provider]      Allergies    Penicillins and Ultram [tramadol]    Review of Systems   Review of Systems  Constitutional:  Negative for chills and fever.  Respiratory:  Positive for shortness of breath (chronic). Negative for cough.   Cardiovascular:  Positive for chest pain. Negative for leg swelling.  Gastrointestinal:  Negative for abdominal pain, diarrhea, nausea and vomiting.  All other systems reviewed and are negative.   Physical Exam Updated Vital Signs BP (!) 182/166   Pulse 96   Temp 98 F (36.7 C) (Oral)   Resp (!) 27   SpO2 100%  Physical Exam Vitals and nursing note reviewed.  Constitutional:      General: She is not in acute distress.    Appearance: She is not ill-appearing.  HENT:     Head: Normocephalic.  Eyes:     Pupils: Pupils are equal, round, and reactive to light.  Cardiovascular:     Rate and Rhythm: Normal rate and regular rhythm.     Pulses: Normal pulses.     Heart sounds: Normal heart sounds. No murmur heard.    No friction rub. No gallop.  Pulmonary:     Effort: Pulmonary effort is normal.     Breath sounds: Normal breath sounds.  Chest:     Comments: Reproducible chest wall tenderness over left breast without crepitus or deformity Abdominal:     General: Abdomen is flat. There is no distension.     Palpations:  Abdomen is soft.     Tenderness: There is no abdominal tenderness. There is no guarding or rebound.  Musculoskeletal:        General: Normal range of motion.     Cervical back: Neck supple.     Comments: Pitting edema to right lower extremity. Trace edema to LLE.  Skin:    General: Skin is warm and dry.  Neurological:     General: No focal deficit present.     Mental Status: She is alert.  Psychiatric:        Mood and Affect: Mood normal.        Behavior: Behavior normal.     ED Results / Procedures / Treatments   Labs (all labs ordered are listed, but only abnormal results are displayed) Labs Reviewed  BASIC METABOLIC PANEL - Abnormal; Notable for  the following components:      Result Value   Sodium 134 (*)    CO2 14 (*)    Glucose, Bld 154 (*)    BUN 32 (*)    Creatinine, Ser 2.33 (*)    GFR, Estimated 24 (*)    Anion gap 17 (*)    All other components within normal limits  CBC - Abnormal; Notable for the following components:   Hemoglobin 11.0 (*)    HCT 33.2 (*)    RDW 16.1 (*)    All other components within normal limits  BRAIN NATRIURETIC PEPTIDE - Abnormal; Notable for the following components:   B Natriuretic Peptide 1,111.4 (*)    All other components within normal limits  RAPID URINE DRUG SCREEN, HOSP PERFORMED  I-STAT BETA HCG BLOOD, ED ([facility], [facility], [facility] ONLY)  TROPONIN I (HIGH SENSITIVITY)  TROPONIN I (HIGH SENSITIVITY)    EKG EKG Interpretation  Date/Time:  Wednesday May 09 2022 03:39:03 EDT Ventricular Rate:  70 PR Interval:  164 QRS Duration: 72 QT Interval:  462 QTC Calculation: 498 R Axis:   12 Text Interpretation: Normal sinus rhythm Possible Left atrial enlargement Prolonged QT Abnormal ECG When compared with ECG of 25-Apr-2022 14:14, No significant change was found Confirmed by Delora Fuel ([id number]) on 05/09/2022 5:59:16 AM  Radiology DG Chest 2 View  Result Date: 05/09/2022 CLINICAL DATA:  Chest pain and shortness of breath EXAM: CHEST -  2 VIEW COMPARISON:  03/22/2022 FINDINGS: Normal heart size and mediastinal contours. Diffuse interstitial coarsening which is similar to prior. No acute infiltrate or edema. No effusion or pneumothorax. No acute osseous findings. IMPRESSION: No acute finding when compared to prior. Electronically Signed   By: Jorje Guild M.D.   On: 05/09/2022 04:34    Procedures Procedures    Medications Ordered in ED Medications  furosemide (LASIX) injection 40 mg (40 mg Intravenous Given 05/09/22 0742)  LORazepam (ATIVAN) injection 0.5 mg (0.5 mg Intravenous Given 05/09/22 0741)    ED Course/ Medical Decision Making/ A&P Clinical Course as of 05/09/22 0953  Wed May 09, 2022  0655 B Natriuretic Peptide(!): 1,111.4 [CA]  0655 Glucose(!): 154 [CA]  0655 BUN(!): 32 [CA]  0655 Creatinine(!): 2.33 [CA]    Clinical Course User Index [CA] Suzy Bouchard, PA-C                           Medical Decision Making Amount and/or Complexity of Data Reviewed Independent Historian: EMS External Data Reviewed: notes.    Details: Cardiology and previous admission Labs: ordered. Decision-making details documented in ED Course. Radiology: ordered and independent interpretation performed. Decision-making details documented in ED Course. ECG/medicine tests: ordered and independent interpretation performed. Decision-making details documented in ED Course.  Risk Prescription drug management. Decision regarding hospitalization.   This patient presents to the ED for concern of CP, this involves an extensive number of treatment options, and is a complaint that carries with it a high risk of complications and morbidity.  The differential diagnosis includes ACS, aortic dissection, PE, pneumonia, MSK etiology, etc   61 year old female presents to the ED due to left-sided chest pain that started around 1 AM this morning.  History of NSTEMI in May 2023.  Patient also has a history of cocaine use, last use yesterday.   Patient notes pain feels similar to her previous NSTEMI.  History of CHF not currently on any diuretics for the past week.  On arrival, patient  afebrile, not tachycardic or hypoxic.  Patient no acute distress.  Physical exam significant for reproducible left-sided chest wall tenderness without crepitus or deformity.  Some pitting edema to right lower extremity.  Patient given aspirin, nitroglycerin, and Zofran by EMS prior to arrival.  Labs ordered.  Troponin to rule out ACS.  BNP to rule out CHF.  Chest x-ray to rule out evidence of pneumonia. Possible cocaine induced vasospasm?    BNP elevated.  IV Lasix given. Troponins flat. Low suspicion for ACS. Lower suspicion for aortic dissection.  Creatinine elevated at 2.3 and BUN at 32.  Unable to receive IV contrast.  Lower suspicion for PE.  Chest x-ray personally reviewed and interpreted which is negative for signs of pneumonia, pneumothorax, widened mediastinum.  EKG demonstrates normal sinus rhythm.  No signs of acute ischemia.  Reassessed patient, patient still having chest pain with slight improvement. Will require admission for chest pain rule out in the setting of cocaine and CHF exacerbation. Discussed with Dr. Zenia Resides who evaluated patient at bedside and agrees with assessment and plan.   9:37 AM Discussed with Internal Medicine who agrees to admit patient for further treatment.        Final Clinical Impression(s) / ED Diagnoses Final diagnoses:  Acute on chronic congestive heart failure, unspecified heart failure type ([place])  Nonspecific chest pain    Rx / DC Orders ED Discharge Orders     None         Karie Kirks 05/09/22 0953    Lacretia Leigh, MD 05/14/22 2118

## 2022-05-09 NOTE — ED Triage Notes (Addendum)
Patient arrived with EMS from home reports central chest pain with SOB and lightheaded onset this evening , nausea ,no emesis or diaphoresis , she received Zofran 4 mg IV , ASA 324 mg and 1 NTG sl prior to arrival . History of CHF/NSTEMI.

## 2022-05-09 NOTE — Telephone Encounter (Signed)
Called, no answer.  LVM for her to call me back so we can reschedule visit from 9/19.

## 2022-05-09 NOTE — Telephone Encounter (Signed)
Carol [name] returned my call and let me know she went to the hospital at 3:00 this morning.  She thinks she might be discharged tomorrow.  I told her I would watch for her to be discharged and hopefully I can see her tomorrow or Friday.    [name], [place] 05/09/2022

## 2022-05-09 NOTE — ED Provider Notes (Signed)
I provided a substantive portion of the care of this patient.  I personally performed the entirety of the medical decision making for this encounter.  EKG Interpretation  Date/Time:  Wednesday May 09 2022 03:39:03 EDT Ventricular Rate:  70 PR Interval:  164 QRS Duration: 72 QT Interval:  462 QTC Calculation: 498 R Axis:   12 Text Interpretation: Normal sinus rhythm Possible Left atrial enlargement Prolonged QT Abnormal ECG When compared with ECG of 25-Apr-2022 14:14, No significant change was found Confirmed by Delora Fuel ([id number]) on 05/09/2022 5:59:26 AM    61 year old female presents with chest pain shortness of breath.  History of cocaine use.  Patient is EKG per interpretation shows normal sinus rhythm.  No signs of acute ischemic changes.  Patient with elevated BNP here.  Suspect some evidence of heart failure.  Plan will be for admission   Lacretia Leigh, MD 05/09/22 [phone]

## 2022-05-09 NOTE — ED Provider Triage Note (Addendum)
Emergency Medicine Provider Triage Evaluation Note  Carol [name] , a 61 y.o. female  was evaluated in triage.  Pt complains of chest pain and shortness of breath. She states that same awoke her from sleep about 2 hours ago. She was in good health when she went to bed last night. Pain is located throughout her chest and does not radiate. Does not feel like previous Mis. She has been taking her demadex as prescribed and does not feel like she is retaining fluid. Endorses some associated shortness of breath. Brought in by EMS who placed her on 2L O2 via [place]. She is not normally on oxygen. Given 324 ASA and 1 NTG with improvement. Denies any leg pain or leg swelling.  Review of Systems  Positive:  Negative:   Physical Exam  BP (!) 144/77 (BP Location: Right Arm)   Pulse 72   Temp 97.9 F (36.6 C) (Oral)   Resp 18   SpO2 99%  Gen:   Awake, no distress   Resp:  Normal effort  MSK:   Moves extremities without difficulty  Other:  No edema in legs.  Medical Decision Making  Medically screening exam initiated at 3:48 AM.  Appropriate orders placed.  Carol [name] was informed that the remainder of the evaluation will be completed by another provider, this initial triage assessment does not replace that evaluation, and the importance of remaining in the ED until their evaluation is complete.     Bud Face, PA-C 05/09/22 0351    Bud Face, PA-C 05/09/22 [phone]

## 2022-05-10 ENCOUNTER — Institutional Professional Consult (permissible substitution): Payer: Medicare Other | Admitting: Pulmonary Disease

## 2022-05-10 ENCOUNTER — Telehealth (HOSPITAL_COMMUNITY): Payer: Self-pay | Admitting: Surgery

## 2022-05-10 DIAGNOSIS — I2510 Atherosclerotic heart disease of native coronary artery without angina pectoris: Secondary | ICD-10-CM | POA: Diagnosis not present

## 2022-05-10 DIAGNOSIS — I5032 Chronic diastolic (congestive) heart failure: Secondary | ICD-10-CM | POA: Diagnosis not present

## 2022-05-10 DIAGNOSIS — N184 Chronic kidney disease, stage 4 (severe): Secondary | ICD-10-CM | POA: Diagnosis not present

## 2022-05-10 DIAGNOSIS — I130 Hypertensive heart and chronic kidney disease with heart failure and stage 1 through stage 4 chronic kidney disease, or unspecified chronic kidney disease: Secondary | ICD-10-CM

## 2022-05-10 DIAGNOSIS — R079 Chest pain, unspecified: Secondary | ICD-10-CM | POA: Diagnosis not present

## 2022-05-10 DIAGNOSIS — R0789 Other chest pain: Secondary | ICD-10-CM | POA: Diagnosis not present

## 2022-05-10 LAB — CBC
HCT: 28.6 % — ABNORMAL LOW (ref 36.0–46.0)
Hemoglobin: 9.5 g/dL — ABNORMAL LOW (ref 12.0–15.0)
MCH: 27.4 pg (ref 26.0–34.0)
MCHC: 33.2 g/dL (ref 30.0–36.0)
MCV: 82.4 fL (ref 80.0–100.0)
Platelets: 221 10*3/uL (ref 150–400)
RBC: 3.47 MIL/uL — ABNORMAL LOW (ref 3.87–5.11)
RDW: 15.9 % — ABNORMAL HIGH (ref 11.5–15.5)
WBC: 5.1 10*3/uL (ref 4.0–10.5)
nRBC: 0 % (ref 0.0–0.2)

## 2022-05-10 LAB — BASIC METABOLIC PANEL
Anion gap: 10 (ref 5–15)
BUN: 49 mg/dL — ABNORMAL HIGH (ref 6–20)
CO2: 21 mmol/L — ABNORMAL LOW (ref 22–32)
Calcium: 8.4 mg/dL — ABNORMAL LOW (ref 8.9–10.3)
Chloride: 105 mmol/L (ref 98–111)
Creatinine, Ser: 3.11 mg/dL — ABNORMAL HIGH (ref 0.44–1.00)
GFR, Estimated: 17 mL/min — ABNORMAL LOW (ref >60)
Glucose, Bld: 152 mg/dL — ABNORMAL HIGH (ref 70–99)
Potassium: 3.8 mmol/L (ref 3.5–5.1)
Sodium: 136 mmol/L (ref 135–145)

## 2022-05-10 LAB — GLUCOSE, CAPILLARY
Glucose-Capillary: 141 mg/dL — ABNORMAL HIGH (ref 70–99)
Glucose-Capillary: 116 mg/dL — ABNORMAL HIGH (ref 70–99)
Glucose-Capillary: 118 mg/dL — ABNORMAL HIGH (ref 70–99)
Glucose-Capillary: 156 mg/dL — ABNORMAL HIGH (ref 70–99)

## 2022-05-10 MED ORDER — ISOSORBIDE MONONITRATE ER 30 MG PO TB24: 15.0000 mg | ORAL_TABLET | Freq: Every day | ORAL | 0 refills | Status: DC

## 2022-05-10 MED ORDER — NEBIVOLOL HCL 20 MG PO TABS: 20.0000 mg | ORAL_TABLET | Freq: Every morning | ORAL | 0 refills | Status: DC

## 2022-05-10 MED ORDER — ASPIRIN 81 MG PO TBEC: 81.0000 mg | DELAYED_RELEASE_TABLET | Freq: Every day | ORAL | 12 refills | Status: DC

## 2022-05-10 MED ORDER — ERGOCALCIFEROL 1.25 MG (50000 UT) PO CAPS: 50000.0000 [IU] | ORAL_CAPSULE | ORAL | 0 refills | Status: DC

## 2022-05-10 MED ORDER — QUETIAPINE FUMARATE ER 300 MG PO TB24: 300.0000 mg | ORAL_TABLET | Freq: Every day | ORAL | 0 refills | Status: DC

## 2022-05-10 MED ORDER — INSULIN GLARGINE 100 UNIT/ML SOLOSTAR PEN: 5.0000 [IU] | PEN_INJECTOR | Freq: Every day | SUBCUTANEOUS | 0 refills | Status: DC

## 2022-05-10 MED ORDER — PANTOPRAZOLE SODIUM 40 MG PO TBEC: 40.0000 mg | DELAYED_RELEASE_TABLET | Freq: Every day | ORAL | 0 refills | Status: DC

## 2022-05-10 MED ORDER — GLIMEPIRIDE 4 MG PO TABS: 4.0000 mg | ORAL_TABLET | Freq: Two times a day (BID) | ORAL | 0 refills | Status: DC

## 2022-05-10 MED ORDER — PEN NEEDLES 32G X 4 MM MISC: 1.0000 [IU] | Freq: Every day | 0 refills | Status: DC

## 2022-05-10 MED ORDER — NITROGLYCERIN 0.4 MG SL SUBL: 0.4000 mg | SUBLINGUAL_TABLET | SUBLINGUAL | 0 refills | Status: DC | PRN

## 2022-05-10 MED ORDER — GABAPENTIN 100 MG PO CAPS: 200.0000 mg | ORAL_CAPSULE | Freq: Two times a day (BID) | ORAL | 0 refills | Status: DC

## 2022-05-10 MED ORDER — CLOPIDOGREL BISULFATE 75 MG PO TABS: 75.0000 mg | ORAL_TABLET | Freq: Every day | ORAL | 0 refills | Status: DC

## 2022-05-10 MED ORDER — ATORVASTATIN CALCIUM 80 MG PO TABS: 80.0000 mg | ORAL_TABLET | Freq: Every day | ORAL | 0 refills | Status: DC

## 2022-05-10 MED ORDER — TORSEMIDE 10 MG PO TABS: 10.0000 mg | ORAL_TABLET | Freq: Every day | ORAL | 0 refills | Status: DC

## 2022-05-10 MED ORDER — SERTRALINE HCL 50 MG PO TABS: 50.0000 mg | ORAL_TABLET | Freq: Every evening | ORAL | 0 refills | Status: DC

## 2022-05-10 MED ORDER — AMLODIPINE BESYLATE 10 MG PO TABS: 10.0000 mg | ORAL_TABLET | Freq: Every day | ORAL | 0 refills | Status: DC

## 2022-05-10 NOTE — Plan of Care (Signed)
  Problem: Education: Goal: Ability to describe self-care measures that may prevent or decrease complications (Diabetes Survival Skills Education) will improve Outcome: Progressing   Problem: Health Behavior/Discharge Planning: Goal: Ability to identify and utilize available resources and services will improve Outcome: Progressing Goal: Ability to manage health-related needs will improve Outcome: Progressing   

## 2022-05-10 NOTE — Discharge Summary (Signed)
Name: Carol [name] MRN: [id number] DOB: Mar 23, 1961 61 y.o. PCP: Carol Huh, NP  Date of Admission: 05/09/2022  3:26 AM Date of Discharge: No discharge date for patient encounter. Attending Physician: Axel Filler, *  Discharge Diagnosis: 1. Principal Problem:   Chest pain, atypical Active Problems:   Diabetes mellitus ([place])   Essential hypertension   Pure hypercholesterolemia   CAD in native artery   CKD (chronic kidney disease) stage 4, GFR 15-29 ml/min (HCC)   Acute exacerbation of CHF (congestive heart failure) (HCC)   High anion gap metabolic acidosis   Discharge Medications: Allergies as of 05/11/2022       Reactions   Penicillins Shortness Of Breath, Other (See Comments)   Caused yeast infection Has patient had a PCN reaction causing immediate rash, facial/tongue/throat swelling, SOB or lightheadedness with hypotension: Yes Has patient had a PCN reaction causing severe rash involving mucus membranes or skin necrosis: No Has patient had a PCN reaction that required hospitalization pt was in the hospital at the time of the reaction Has patient had a PCN reaction occurring within the last 10 years: No If all of the above answers are "NO", then may proceed with Cephalosp   Ultram [tramadol] Other (See Comments)   Made her tongue raw        Medication List     STOP taking these medications    hydrALAZINE 25 MG tablet Commonly known as: APRESOLINE   hydrOXYzine 50 MG tablet Commonly known as: ATARAX   ibuprofen 800 MG tablet Commonly known as: ADVIL   methocarbamol 750 MG tablet Commonly known as: ROBAXIN   potassium chloride 10 MEQ tablet Commonly known as: KLOR-CON M       TAKE these medications    albuterol 108 (90 Base) MCG/ACT inhaler Commonly known as: VENTOLIN HFA Inhale 2 puffs into the lungs every 4 (four) hours as needed for wheezing or shortness of breath.   amLODipine 10 MG tablet Commonly known as: NORVASC Take  1 tablet (10 mg total) by mouth daily.   aspirin EC 81 MG tablet Take 1 tablet (81 mg total) by mouth daily. Swallow whole.   atorvastatin 80 MG tablet Commonly known as: LIPITOR Take 1 tablet (80 mg total) by mouth daily.   budesonide-formoterol 160-4.5 MCG/ACT inhaler Commonly known as: SYMBICORT Inhale 2 puffs into the lungs every morning.   cetirizine 10 MG tablet Commonly known as: ZYRTEC Take 10 mg by mouth daily as needed for allergies.   clopidogrel 75 MG tablet Commonly known as: PLAVIX Take 1 tablet (75 mg total) by mouth daily.   ergocalciferol 1.25 MG (50000 UT) capsule Commonly known as: VITAMIN D2 Take 1 capsule (50,000 Units total) by mouth once a week. Wednesday   gabapentin 100 MG capsule Commonly known as: NEURONTIN Take 2 capsules (200 mg total) by mouth 2 (two) times daily.   glimepiride 4 MG tablet Commonly known as: AMARYL Take 1 tablet (4 mg total) by mouth 2 (two) times daily.   insulin glargine 100 UNIT/ML Solostar Pen Commonly known as: LANTUS Inject 5 Units into the skin at bedtime.   isosorbide mononitrate 30 MG 24 hr tablet Commonly known as: IMDUR Take 0.5 tablets (15 mg total) by mouth daily. What changed: how much to take   Nebivolol HCl 20 MG Tabs Take 1 tablet (20 mg total) by mouth every morning.   nitroGLYCERIN 0.4 MG SL tablet Commonly known as: NITROSTAT Place 1 tablet (0.4 mg total) under the tongue every  5 (five) minutes as needed for chest pain.   pantoprazole 40 MG tablet Commonly known as: PROTONIX Take 1 tablet (40 mg total) by mouth daily at 12 noon.   Pen Needles 32G X 4 MM Misc 1 Units by Does not apply route daily.   QUEtiapine 300 MG 24 hr tablet Commonly known as: SEROQUEL XR Take 1 tablet (300 mg total) by mouth at bedtime.   sertraline 50 MG tablet Commonly known as: ZOLOFT Take 1 tablet (50 mg total) by mouth every evening.   torsemide 10 MG tablet Commonly known as: DEMADEX Take 1 tablet (10 mg  total) by mouth daily. RESTART on Saturday 04/28/22. What changed: medication strength        Disposition and follow-up:   [name] was discharged from [facility] in Stable condition.  At the hospital follow up visit please address:  1.  Chest pain- please assess medication adherence, tolerance, and resolution with chest pain. Patient missed Cardiology follow up during admission. Will need her to make a follow up appointment.   AKI- please repeat BMP for resolution of   Heart failure with preserved EF: Please assess volume status, asked patient to hold home torsemide and restart on 9/23  Blood pressure:Hydralazine held due to normotensive please assess BP and adjust BP medications   2.  Labs / imaging needed at time of follow-up: BMP   3.  Pending labs/ test needing follow-up: none  Follow-up Appointments:   Hospital Course by problem list: 61 year old person living with ischemic heart disease, chronic kidney disease, cocaine use disorder, T2DM on insulin who was admitted for atypical chest pain in the setting of irregular medication use and cocaine use.   Atypical Chest Pain Presented with left-sided chest pain/tightness, dizziness, and shortness of breath. ACS was ruled out in the ED with unremarkable ECG and troponin. Chest X-ray was also normal. Patient endorsed cocaine use the night symptoms began. Chest pain managed medically with amlodopine, nebivolol, Lipitor, and Imdur, with Lidocaine patch, oxycodone, and Tylenol PRN for pain. Pain resolved during day 2 of admission with no further episodes of chest pain. The importance of cocaine cessation has been thoroughly counseled and patient expressed a good understanding of that importance and has been in contact with her sponsor. Patient instructed to follow-up with cardiology and her PCP.  2. Acute on chronic CKD 4 Creatinine was 2.33 upon admission and increased to 3.11 after Lasix diuresis in the  ED. Her regimen of torsemide was subsequently withheld. Patient subsequently had minor reduction in Cr (3.02) but this has been at or better than her baseline for the past year (many fluctuations but baseline appears to be between 2.3-3.0). Patient told to withhold her torsemide until 05/13/22 and was counseled on importance of both medication adherence and follow-up with PCP in order to monitor BMP.  3. HFpEF Patient presented with shortness of breath in the ED as well as an elevated BNP. Initially given Lasix in the ED for hypervolemia. Patient subsequently became slightly hypovolemic on exam and there was also rise in creatinine. Torsemide withheld and patient currently appears euvolemic and asymptomatic. Resumed home medication on discharge.  4. Type 2 Diabetes A1c at 6.5 and glucose levels have remained relatively stable. Patient was given Semglee and sliding scale to control blood sugar. Resume home insulin and glimepiride on discharge.  5. Hypertension BP significantly elevated in the ED but has since been well-controlled with amlodipine and nebivolol. Home hydralazine was withheld due  to a few hypotensive episodes. She remain normotensive during her hospitalization. Will need reassessment of her BP at follow up.   6. Bipolar disorder: Mood/affect has been stable since admission. Patient has been getting her home regimen of Seroquel and Zoloft.    Discharge Exam:   BP 139/68 (BP Location: Left Arm)   Pulse 61   Temp 97.8 F (36.6 C) (Oral)   Resp 20   Ht 5\' 4"  (1.626 m)   Wt 85.5 kg   SpO2 98%   BMI 32.37 kg/m  Discharge exam:  Constitutional: Sitting up in bed in no acute distress HENT: Normocephalic and atraumatic, EOMI, conjunctiva normal Cardiovascular: Normal rate, no LE edema Respiratory: No respiratory distress, no accessory muscle use.  Effort is normal.   Musculoskeletal: Normal bulk and tone.   Neurological: Is alert and oriented x4, no apparent focal deficits Skin:  Warm and dry.  No rash, erythema, lesions noted.  Pertinent Labs, Studies, and Procedures:  DG Chest 2 View  Result Date: 05/09/2022 CLINICAL DATA:  Chest pain and shortness of breath EXAM: CHEST - 2 VIEW COMPARISON:  03/22/2022 FINDINGS: Normal heart size and mediastinal contours. Diffuse interstitial coarsening which is similar to prior. No acute infiltrate or edema. No effusion or pneumothorax. No acute osseous findings. IMPRESSION: No acute finding when compared to prior. Electronically Signed   By: Jorje Guild M.D.   On: 05/09/2022 04:34       Latest Ref Rng & Units 05/10/2022    6:43 AM 05/09/2022    3:53 AM 03/27/2022    7:18 AM  CBC  WBC 4.0 - 10.5 K/uL 5.1  8.1  7.8   Hemoglobin 12.0 - 15.0 g/dL 9.5  11.0  9.1   Hematocrit 36.0 - 46.0 % 28.6  33.2  28.3   Platelets 150 - 400 K/uL 221  265  327       Latest Ref Rng & Units 05/11/2022    7:24 AM 05/10/2022    6:43 AM 05/09/2022   11:54 AM  BMP  Glucose 70 - 99 mg/dL 121  152  95   BUN 6 - 20 mg/dL 55  49  33   Creatinine 0.44 - 1.00 mg/dL 3.02  3.11  2.32   Sodium 135 - 145 mmol/L 136  136  136   Potassium 3.5 - 5.1 mmol/L 4.0  3.8  4.2   Chloride 98 - 111 mmol/L 108  105  104   CO2 22 - 32 mmol/L 19  21  20    Calcium 8.9 - 10.3 mg/dL 8.4  8.4  9.5     Discharge Instructions: Discharge Instructions     (HEART FAILURE PATIENTS) Call MD:  Anytime you have any of the following symptoms: 1) 3 pound weight gain in 24 hours or 5 pounds in 1 week 2) shortness of breath, with or without a dry hacking cough 3) swelling in the hands, feet or stomach 4) if you have to sleep on extra pillows at night in order to breathe.   Complete by: As directed    (HEART FAILURE PATIENTS) Call MD:  Anytime you have any of the following symptoms: 1) 3 pound weight gain in 24 hours or 5 pounds in 1 week 2) shortness of breath, with or without a dry hacking cough 3) swelling in the hands, feet or stomach 4) if you have to sleep on extra pillows at  night in order to breathe.   Complete by: As directed  Call MD for:  difficulty breathing, headache or visual disturbances   Complete by: As directed    Call MD for:  difficulty breathing, headache or visual disturbances   Complete by: As directed    Call MD for:  persistant nausea and vomiting   Complete by: As directed    Call MD for:  severe uncontrolled pain   Complete by: As directed    Diet - low sodium heart healthy   Complete by: As directed    Diet - low sodium heart healthy   Complete by: As directed    Increase activity slowly   Complete by: As directed    Increase activity slowly   Complete by: As directed        Signed: Iona Beard, MD 05/11/2022, 10:53 AM   Pager: [phone]

## 2022-05-10 NOTE — Progress Notes (Addendum)
Subjective:  Patient feels better overall. Chest pain still present but has improved since yesterday. Denies fever, orthopnea, shortness of breath, chills, and dizziness.  Objective:  Vital signs in last 24 hours: Vitals:   05/09/22 2332 05/10/22 0530 05/10/22 0913 05/10/22 0914  BP: 123/64 131/64 (!) 156/116 (!) 147/74  Pulse: (!) 59 60 63   Resp:  18 18   Temp: 98 F (36.7 C) 98 F (36.7 C) 98.4 F (36.9 C)   TempSrc: Oral Oral Oral   SpO2: 97% 96% 93%   Weight:  83.9 kg    Height:       Weight change:   Intake/Output Summary (Last 24 hours) at 05/10/2022 1100 Last data filed at 05/10/2022 0915 Gross per 24 hour  Intake 100 ml  Output 300 ml  Net -200 ml   Physical Exam Constitutional:      General: Sitting up comfortably in no acute distress Cardiovascular:     Normal rate and regular rhythm without murmur     No LE edema Pulmonary:     Lungs CTAB, normal respiratory effort Skin:    Skin is warm Neurological:     General: No focal deficit present.     Mental Status: She is alert and oriented x 3 Psychiatric:       Normal mood, behavior, and affect   Assessment/Plan:  Principal Problem:   Chest pain, atypical Active Problems:   Diabetes mellitus ([place])   Essential hypertension   Pure hypercholesterolemia   CAD in native artery   CKD (chronic kidney disease) stage 4, GFR 15-29 ml/min (HCC)   Acute exacerbation of CHF (congestive heart failure) (HCC)   High anion gap metabolic acidosis   Atypical chest pain Given that ACS has been ruled out, her chest pain was most likely a combination of recent cocaine use, CAD, and anxiety. Patient counseled extensively on cocaine use cessation and she has been in contact with her sponsor as of today. -Lipid panel normal other than hypertriglyceridemia (194) -Continue aspirin, Plavix, beta-blocker and statin -Resume Imdur -Oxycodone and Tylenol PRN  -Follow-up with cardiology outpatient.  They are trying to get a  cardiac MRI to rule out sarcoidosis given her history of liver sarcoidosis.  This may be challenging due to his CKD 4.   2. CKD 4 After IV Lasix 40 mg was given yesterday morning, creatinine increased from 2.32 to 3.11, GFR decreased from 24 to 17, and BUN increased from 33 to 49. She originally came in with an [place] but subsequent testing revealed a normal lactic acid level and UA is negative for ketones. -Hold all diuretics for now -monitor BMP  -Follow-up with outpatient nephrology   3. HFpEF Patient initially looked hypervolemic in the ED which prompted Lasix administration. Kidney function declined perhaps due to overdiuresis. Appears euvolemic currently. -Hold her torsemide (home regimen) for now and encourage oral fluid intake  -Continue to monitor fluid status and kidney function.  4. Type 2 diabetes -A1c = 6.5 -Semglee 10 units daily with sliding scale -May benefit from SGLT2 as an outpatient   5. Hypertension BP continues to be normal to slightly elevated. -Continue amlodipine, Imdur, nebivolol and continue to monitor   6. Bipolar disorder -Seroquel and zoloft resumed   LOS: 1 Day  Stanford Breed, Medical Student 05/10/2022, 11:00 AM   Attestation for Student Documentation:  I personally was present and performed or re-performed the history, physical exam and medical decision-making activities of this service and have verified that the  service and findings are accurately documented in the student's note.  Iona Beard, MD 05/10/2022, 2:03 PM

## 2022-05-10 NOTE — Hospital Course (Addendum)
Atypical Chest Pain Presented with left-sided chest pain/tightness, dizziness, and shortness of breath. ACS was ruled out in the ED with unremarkable ECG and troponin. Chest X-ray was also normal. Patient endorsed cocaine use the night symptoms began. Chest pain managed medically with amlodopine, nebivolol, Lipitor, and Imdur, with Lidocaine patch, oxycodone, and Tylenol PRN for pain. The importance of cocaine cessation has been thoroughly counseled and patient expressed a good understanding of that importance and has been in contact with her sponsor. Patient instructed to follow-up with cardiology and her PCP.  2. Acute on chronic CKD 4 Creatinine was 2.33 upon admission and increased to 3.11 after Lasix diuresis in the ED. Her regimen of torsemide was subsequently withheld. Patient subsequently had minor reduction in Cr (3.02) but this has been at or better than her baseline for the past year (many fluctuations but baseline appears to be between 2.3-3.0). Patient told to withhold her torsemide until 05/13/22 and was counseled on importance of both medication adherence and follow-up with PCP/outpatient nephrology in order to monitor BMP.  3. HFpEF Patient presented with shortness of breath in the ED as well as an elevated BNP. Initially given Lasix in the ED for hypervolemia. Patient subsequently became slightly hypovolemic on exam and there was also rise in creatinine. Torsemide withheld and patient currently appears euvolemic and asymptomatic. Resumed home medication on discharge.  4. Type 2 Diabetes A1c at 6.5 and glucose levels have remained relatively stable. Patient was given Semglee and sliding scale to control blood sugar.   5. Hypertension BP significantly elevated in the ED but has since been well-controlled with amlodipine and nebivolol. Home hydralazine was withheld due to a few hypotensive episodes. Patient instructed to follow-up with PCP.  6. Bipolar disorder: Mood/affect has been  stable since admission. Patient has been getting her home regimen of Seroquel and Zoloft.

## 2022-05-10 NOTE — Discharge Instructions (Addendum)
Dear. Ms Carol [name],  You were admitted for chest pain. We think this is due to your underlying coronary artery disease, cocaine use, and elevated blood pressure. We recommend you remain adherent to you hear and blood pressure medications to help with this. Additionally it is very import you stop using cocaine and cigarettes. This will help prevent chest pain and worsening of heart disease.  When you are home please take the following: -Atorvastatin 10 mg daily -Aspirin 81 mg daily, -Imdur 20 mg daily -Nebivolol 20 mg daily -Plavix 75 mg daily  -Restart Torsemide 10 mg daily on Sunday 05/13/2022  Please stop: hydralazine (you BP did not require this in the hospital, this may be restarted in the future if needed), potassium, ibuprofen  Continue your other regular home mediations  You can take tylenol 500 mg 4 times a day for pain as well as lidocaine patches which you can get over the counter  Please schedule follow up with your primary care provider in 2-3  weeks and Cardiology in 1-2 months

## 2022-05-10 NOTE — TOC Progression Note (Signed)
Transition of Care [facility]) - Progression Note    Patient Details  Name: Carol [name] MRN: [id number] Date of Birth: 02/22/1961  [facility] CM/SW Contact  Zenon Mayo, RN Phone Number: 05/10/2022, 12:27 PM  Clinical Narrative:    from home alone, chest pain, NSTEMI, PSA use, indep, NCM asked patient who will be transporting her home at dc. She states she is going to pay for her self a cab to get home at dc.  TOC following.         Expected Discharge Plan and Services           Expected Discharge Date: 05/10/22                                     Social Determinants of Health (SDOH) Interventions    Readmission Risk Interventions    03/27/2022   10:36 AM  Readmission Risk Prevention Plan  Transportation Screening Complete  Medication Review ([place]) Complete  PCP or Specialist appointment within 3-5 days of discharge Complete  HRI or City of [place] Complete  SW Recovery Care/Counseling Consult Complete  [place] Not Applicable

## 2022-05-10 NOTE — Telephone Encounter (Signed)
I called patient to remind her to mail in [place] monitor as it is noted in her chart that it was applied and she had yet to return it and I received email from company stating that it was past due.  I left a message requesting that she mail the monitor in the package that was provided.

## 2022-05-11 ENCOUNTER — Other Ambulatory Visit (HOSPITAL_COMMUNITY): Payer: Self-pay

## 2022-05-11 DIAGNOSIS — R0789 Other chest pain: Secondary | ICD-10-CM | POA: Diagnosis not present

## 2022-05-11 DIAGNOSIS — R079 Chest pain, unspecified: Secondary | ICD-10-CM | POA: Diagnosis not present

## 2022-05-11 LAB — BASIC METABOLIC PANEL
Anion gap: 9 (ref 5–15)
BUN: 55 mg/dL — ABNORMAL HIGH (ref 6–20)
CO2: 19 mmol/L — ABNORMAL LOW (ref 22–32)
Calcium: 8.4 mg/dL — ABNORMAL LOW (ref 8.9–10.3)
Chloride: 108 mmol/L (ref 98–111)
Creatinine, Ser: 3.02 mg/dL — ABNORMAL HIGH (ref 0.44–1.00)
GFR, Estimated: 17 mL/min — ABNORMAL LOW (ref >60)
Glucose, Bld: 121 mg/dL — ABNORMAL HIGH (ref 70–99)
Potassium: 4 mmol/L (ref 3.5–5.1)
Sodium: 136 mmol/L (ref 135–145)

## 2022-05-11 LAB — GLUCOSE, CAPILLARY
Glucose-Capillary: 95 mg/dL (ref 70–99)
Glucose-Capillary: 183 mg/dL — ABNORMAL HIGH (ref 70–99)

## 2022-05-11 MED ORDER — CLOPIDOGREL BISULFATE 75 MG PO TABS
75.0000 mg | ORAL_TABLET | Freq: Every day | ORAL | 0 refills | Status: DC
  Filled 2022-05-11: qty 30, 30d supply, fill #0

## 2022-05-11 MED ORDER — PANTOPRAZOLE SODIUM 40 MG PO TBEC: 40.0000 mg | DELAYED_RELEASE_TABLET | Freq: Every day | ORAL | 0 refills | Status: AC

## 2022-05-11 MED ORDER — ASPIRIN 81 MG PO TBEC
81.0000 mg | DELAYED_RELEASE_TABLET | Freq: Every day | ORAL | 0 refills | Status: AC
  Filled 2022-05-11: qty 30, 30d supply, fill #0

## 2022-05-11 MED ORDER — ATORVASTATIN CALCIUM 80 MG PO TABS
80.0000 mg | ORAL_TABLET | Freq: Every day | ORAL | 0 refills | Status: DC
  Filled 2022-05-11: qty 30, 30d supply, fill #0

## 2022-05-11 MED ORDER — QUETIAPINE FUMARATE ER 300 MG PO TB24
300.0000 mg | ORAL_TABLET | Freq: Every day | ORAL | 0 refills | Status: DC
  Filled 2022-05-11: qty 30, 30d supply, fill #0

## 2022-05-11 MED ORDER — GLIMEPIRIDE 2 MG PO TABS
4.0000 mg | ORAL_TABLET | Freq: Two times a day (BID) | ORAL | 0 refills | Status: DC
  Filled 2022-05-11: qty 120, 30d supply, fill #0

## 2022-05-11 MED ORDER — TORSEMIDE 10 MG PO TABS
10.0000 mg | ORAL_TABLET | Freq: Every day | ORAL | 0 refills | Status: DC
  Filled 2022-05-11: qty 30, 30d supply, fill #0

## 2022-05-11 MED ORDER — ISOSORBIDE MONONITRATE ER 30 MG PO TB24
15.0000 mg | ORAL_TABLET | Freq: Every day | ORAL | 0 refills | Status: DC
  Filled 2022-05-11: qty 15, 30d supply, fill #0

## 2022-05-11 MED ORDER — NEBIVOLOL HCL 20 MG PO TABS
20.0000 mg | ORAL_TABLET | Freq: Every morning | ORAL | 0 refills | Status: DC
  Filled 2022-05-11: qty 30, 30d supply, fill #0

## 2022-05-11 MED ORDER — AMLODIPINE BESYLATE 10 MG PO TABS
10.0000 mg | ORAL_TABLET | Freq: Every day | ORAL | 0 refills | Status: DC
  Filled 2022-05-11: qty 30, 30d supply, fill #0

## 2022-05-11 MED ORDER — SERTRALINE HCL 50 MG PO TABS
50.0000 mg | ORAL_TABLET | Freq: Every evening | ORAL | 0 refills | Status: DC
  Filled 2022-05-11: qty 30, 30d supply, fill #0

## 2022-05-11 MED ORDER — NITROGLYCERIN 0.4 MG SL SUBL
0.4000 mg | SUBLINGUAL_TABLET | SUBLINGUAL | 0 refills | Status: AC | PRN
  Filled 2022-05-11: qty 30, 1d supply, fill #0

## 2022-05-11 MED ORDER — INSULIN GLARGINE 100 UNIT/ML SOLOSTAR PEN: 5.0000 [IU] | PEN_INJECTOR | Freq: Every day | SUBCUTANEOUS | 0 refills | Status: DC

## 2022-05-11 MED ORDER — ERGOCALCIFEROL 1.25 MG (50000 UT) PO CAPS
50000.0000 [IU] | ORAL_CAPSULE | ORAL | 0 refills | Status: DC
  Filled 2022-05-11: qty 30, 210d supply, fill #0

## 2022-05-11 MED ORDER — GABAPENTIN 100 MG PO CAPS
200.0000 mg | ORAL_CAPSULE | Freq: Two times a day (BID) | ORAL | 0 refills | Status: DC
  Filled 2022-05-11: qty 60, 15d supply, fill #0

## 2022-05-11 NOTE — Progress Notes (Signed)
   05/11/22 1138  Mobility  Activity Ambulated with assistance in hallway  Level of Assistance Standby assist, set-up cues, supervision of patient - no hands on  Assistive Device Front wheel walker  Distance Ambulated (ft) 240 ft  Activity Response Tolerated well  $Mobility charge 1 Mobility   Mobility Specialist Progress Note  Received pt in bed having no complaints and agreeable to mobility. Pt was asymptomatic throughout ambulation and returned to room w/o fault. Left EOB w/ call bell in reach and all needs met.   Carol [name] Mobility Specialist

## 2022-05-11 NOTE — TOC Transition Note (Signed)
Transition of Care [facility]) - CM/SW Discharge Note   Patient Details  Name: Carol [name] MRN: [id number] Date of Birth: [birth year]  Transition of [facility]) CM/SW Contact:  Zenon Mayo, RN Phone Number: 05/11/2022, 10:57 AM   Clinical Narrative:    Patient is for dc today, she informed this NCM that she would have transport at dc.          Patient Goals and CMS Choice        Discharge Placement                       Discharge Plan and Services                                     Social Determinants of Health (SDOH) Interventions     Readmission Risk Interventions    03/27/2022   10:36 AM  Readmission Risk Prevention Plan  Transportation Screening Complete  Medication Review ([place]) Complete  PCP or Specialist appointment within 3-5 days of discharge Complete  HRI or [place] Complete  SW Recovery Care/Counseling Consult Complete  [place] Not Applicable

## 2022-05-14 ENCOUNTER — Telehealth (HOSPITAL_COMMUNITY): Payer: Self-pay | Admitting: Emergency Medicine

## 2022-05-14 ENCOUNTER — Other Ambulatory Visit: Payer: Self-pay | Admitting: Nurse Practitioner

## 2022-05-14 DIAGNOSIS — Z1231 Encounter for screening mammogram for malignant neoplasm of breast: Secondary | ICD-10-CM

## 2022-05-14 NOTE — Telephone Encounter (Signed)
Called Carol [name] and LVM advising I wanted to do a home visit since she's been discharged from the hospital.  Requested her to call me back.  She has an appointment on 9/27 at the clinic but I would like to get her reconciled before then.

## 2022-05-15 ENCOUNTER — Telehealth (HOSPITAL_COMMUNITY): Payer: Self-pay | Admitting: Emergency Medicine

## 2022-05-15 ENCOUNTER — Other Ambulatory Visit (HOSPITAL_COMMUNITY): Payer: Self-pay | Admitting: Emergency Medicine

## 2022-05-15 NOTE — Telephone Encounter (Signed)
Called and LVM requesting her to call me back that I need to make a home visit to do med changes.    [name], [place] 05/15/2022

## 2022-05-15 NOTE — Progress Notes (Signed)
Paramedicine Encounter    Patient ID: Carol [name], female    DOB: Nov 05, 1960, 61 y.o.   MRN: [id number]   BP (!) 150/92 (BP Location: Left Arm, Patient Position: Sitting, Cuff Size: Normal)   Pulse 86   Resp 16   Wt 181 lb (82.1 kg)   BMI 31.07 kg/m  Weight yesterday-not taken Last visit weight-190lb Cbg 66  Have been trying to reach [name] since she has been discharged from the hospital.  I have left multiple messages w/ no returned calls.  I made an unscheduled home visit today.  On arrival her boyfriend stated he needed to wake her up.  She was alert and oriented but obviously sleepy.  I advised her that she had not returned my calls so I decided to take a chance and drop by.  Pt says she's feeling "better" than she did, "just sleepy."  She denies chest pain or SOB.  Lung sounds clear bilat.  No edema noted.   Reconciled meds.  Per discharge instructions discontinued Hydralazine, Hydroxyzine, Ibuprophen, Methocarbamol, Potassium Chloride.    Pt is now taking Glimepiride 2 tablets (4mg  total) BID and restarted Toresemide 10mg  daily.  She was missing her Amlodopine and Vitamin D2 from her medications.  I contacted her pharmacy and they stated those had been picked up on 9/19.  When I asked her about them she said they were at her boyfriends house.  I requested she get those as soon as she could.  Pt an appointment in the clinic tomorrow and I requested her to bring her medication box and her meds with her to the appointment. It appears she had not really done anything with her meds since she's been home. Pt appears very dis-interested in interacting with me during the visit and doesn't engage much as I try to discuss her status and/or care plan instructions. Home visit complete.    [name], [place] 05/15/2022   Patient Care Team: Simona Huh, NP as PCP - General (Nurse Practitioner) Janina Mayo, MD as PCP - Cardiology (Cardiology)  Patient  Active Problem List   Diagnosis Date Noted   Acute exacerbation of CHF (congestive heart failure) ([place]) 05/09/2022   High anion gap metabolic acidosis 31/49/7026   Chest pain, atypical 05/09/2022   Acute on chronic diastolic (congestive) heart failure ([place]) 03/22/2022   COPD not affecting current episode of care ([place]) 03/22/2022   Class 2 obesity due to excess calories with body mass index (BMI) of 37.0 to 37.9 in adult 03/22/2022   CHF (congestive heart failure) ([place]) 03/14/2022   CKD (chronic kidney disease), stage IV ([place]) 03/10/2022   Hypertensive urgency 01/22/2022   Acute kidney injury superimposed on chronic kidney disease ([place]) 01/22/2022   Acute on chronic diastolic CHF (congestive heart failure) ([place]) 01/22/2022   Prolonged QT interval 01/22/2022   Elevated troponin 01/22/2022   Thyroid nodule 01/22/2022   Cocaine abuse ([place]) 01/02/2022   Diabetic hypoglycemia ([place]) 12/31/2021   CKD (chronic kidney disease) stage 4, GFR 15-29 ml/min ([place]) 12/31/2021   CAD in native artery 12/24/2021   Anxiety with depression 12/24/2021   Pure hypercholesterolemia    Colon cancer screening 02/18/2014   Diabetes mellitus ([place]) 01/29/2014   Essential hypertension 01/29/2014   Closed jaw fracture ([place]) 01/29/2014   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    Current Outpatient Medications:    albuterol (PROVENTIL HFA;VENTOLIN HFA) 108 (90 Base) MCG/ACT inhaler, Inhale 2 puffs into the lungs every 4 (four) hours  as needed for wheezing or shortness of breath., Disp: , Rfl:    aspirin EC 81 MG tablet, Take 1 tablet (81 mg total) by mouth daily. Swallow whole., Disp: 30 tablet, Rfl: 0   atorvastatin (LIPITOR) 80 MG tablet, Take 1 tablet (80 mg total) by mouth daily., Disp: 30 tablet, Rfl: 0   budesonide-formoterol (SYMBICORT) 160-4.5 MCG/ACT inhaler, Inhale 2 puffs into the lungs every morning., Disp: , Rfl:    cetirizine (ZYRTEC) 10 MG tablet, Take 10 mg by mouth daily as needed for  allergies., Disp: , Rfl:    clopidogrel (PLAVIX) 75 MG tablet, Take 1 tablet (75 mg total) by mouth daily., Disp: 30 tablet, Rfl: 0   gabapentin (NEURONTIN) 100 MG capsule, Take 2 capsules (200 mg total) by mouth 2 (two) times daily., Disp: 60 capsule, Rfl: 0   glimepiride (AMARYL) 2 MG tablet, Take 2 tablets (4 mg total) by mouth 2 (two) times daily., Disp: 120 tablet, Rfl: 0   insulin glargine (LANTUS) 100 UNIT/ML Solostar Pen, Inject 5 Units into the skin at bedtime., Disp: 15 mL, Rfl: 0   Insulin Pen Needle (PEN NEEDLES) 32G X 4 MM MISC, 1 Units by Does not apply route daily., Disp: 100 each, Rfl: 0   isosorbide mononitrate (IMDUR) 30 MG 24 hr tablet, Take 1/2 tablet (15 mg total) by mouth daily., Disp: 15 tablet, Rfl: 0   Nebivolol HCl 20 MG TABS, Take 1 tablet (20 mg total) by mouth every morning., Disp: 30 tablet, Rfl: 0   nitroGLYCERIN (NITROSTAT) 0.4 MG SL tablet, Place 1 tablet (0.4 mg total) under the tongue every 5 (five) minutes as needed for chest pain., Disp: 30 tablet, Rfl: 0   pantoprazole (PROTONIX) 40 MG tablet, Take 1 tablet (40 mg total) by mouth daily at 12 noon., Disp: 30 tablet, Rfl: 0   sertraline (ZOLOFT) 50 MG tablet, Take 1 tablet (50 mg total) by mouth every evening., Disp: 30 tablet, Rfl: 0   torsemide (DEMADEX) 10 MG tablet, Take 1 tablet (10 mg total) by mouth daily. RESTART on Saturday 05/13/22., Disp: 30 tablet, Rfl: 0   amLODipine (NORVASC) 10 MG tablet, Take 1 tablet (10 mg total) by mouth daily. (Patient not taking: Reported on 05/15/2022), Disp: 30 tablet, Rfl: 0   ergocalciferol (VITAMIN D2) 1.25 MG (50000 UT) capsule, Take 1 capsule (50,000 Units total) by mouth once a week. Wednesday (Patient not taking: Reported on 05/15/2022), Disp: 30 capsule, Rfl: 0   QUEtiapine (SEROQUEL XR) 300 MG 24 hr tablet, Take 1 tablet (300 mg total) by mouth at bedtime. (Patient not taking: Reported on 05/15/2022), Disp: 30 tablet, Rfl: 0 Allergies  Allergen Reactions   Penicillins  Shortness Of Breath and Other (See Comments)    Caused yeast infection Has patient had a PCN reaction causing immediate rash, facial/tongue/throat swelling, SOB or lightheadedness with hypotension: Yes Has patient had a PCN reaction causing severe rash involving mucus membranes or skin necrosis: No Has patient had a PCN reaction that required hospitalization pt was in the hospital at the time of the reaction Has patient had a PCN reaction occurring within the last 10 years: No If all of the above answers are "NO", then may proceed with Cephalosp   Ultram [Tramadol] Other (See Comments)    Made her tongue raw      Social History   Socioeconomic History   Marital status: Single    Spouse name: Not on file   Number of children: 3   Years of  education: Not on file   Highest education level: Not on file  Occupational History   Not on file  Tobacco Use   Smoking status: Some Days    Packs/day: 0.25    Years: 3.00    Total pack years: 0.75    Types: Cigarettes   Smokeless tobacco: Never  Vaping Use   Vaping Use: Never used  Substance and Sexual Activity   Alcohol use: Yes    Alcohol/week: 0.0 standard drinks of alcohol    Comment: occasional   Drug use: No    Comment: none for 13 years   Sexual activity: Not on file  Other Topics Concern   Not on file  Social History Narrative   Not on file   Social Determinants of Health   Financial Resource Strain: Low Risk  (04/04/2022)   Overall Financial Resource Strain (CARDIA)    Difficulty of Paying Living Expenses: Not very hard  Food Insecurity: No Food Insecurity (04/04/2022)   Hunger Vital Sign    Worried About Running Out of Food in the Last Year: Never true    Ran Out of Food in the Last Year: Never true  Transportation Needs: No Transportation Needs (04/04/2022)   PRAPARE - Hydrologist (Medical): No    Lack of Transportation (Non-Medical): No  Physical Activity: Not on file  Stress: Not on  file  Social Connections: Not on file  Intimate Partner Violence: Not on file    Physical Exam      Future Appointments  Date Time Provider [place]  05/16/2022  2:00 PM [facility] PA/NP [facility] None  06/20/2022  2:00 PM Bensimhon, Shaune Pascal, MD [facility] None  06/29/2022  9:00 AM [facility] 1 [facility] [place], [place] [facility] Paramedic  05/15/22

## 2022-05-16 ENCOUNTER — Encounter (HOSPITAL_COMMUNITY): Payer: Self-pay

## 2022-05-16 ENCOUNTER — Ambulatory Visit (HOSPITAL_COMMUNITY)
Admission: RE | Admit: 2022-05-16 | Discharge: 2022-05-16 | Disposition: A | Discharge status: Home / Self Care | Payer: Medicare Other | Source: Ambulatory Visit | Attending: Family Medicine | Admitting: Family Medicine

## 2022-05-16 ENCOUNTER — Other Ambulatory Visit (HOSPITAL_COMMUNITY): Payer: Self-pay | Admitting: Emergency Medicine

## 2022-05-16 VITALS — BP 166/84 | HR 68 | Wt 182.2 lb

## 2022-05-16 DIAGNOSIS — I160 Hypertensive urgency: Secondary | ICD-10-CM | POA: Diagnosis not present

## 2022-05-16 DIAGNOSIS — Z139 Encounter for screening, unspecified: Secondary | ICD-10-CM

## 2022-05-16 DIAGNOSIS — I10 Essential (primary) hypertension: Secondary | ICD-10-CM

## 2022-05-16 DIAGNOSIS — E11649 Type 2 diabetes mellitus with hypoglycemia without coma: Secondary | ICD-10-CM | POA: Insufficient documentation

## 2022-05-16 DIAGNOSIS — R0683 Snoring: Secondary | ICD-10-CM | POA: Diagnosis not present

## 2022-05-16 DIAGNOSIS — N184 Chronic kidney disease, stage 4 (severe): Secondary | ICD-10-CM | POA: Insufficient documentation

## 2022-05-16 DIAGNOSIS — Z79899 Other long term (current) drug therapy: Secondary | ICD-10-CM | POA: Diagnosis not present

## 2022-05-16 DIAGNOSIS — I2510 Atherosclerotic heart disease of native coronary artery without angina pectoris: Secondary | ICD-10-CM | POA: Diagnosis not present

## 2022-05-16 DIAGNOSIS — D869 Sarcoidosis, unspecified: Secondary | ICD-10-CM | POA: Insufficient documentation

## 2022-05-16 DIAGNOSIS — I252 Old myocardial infarction: Secondary | ICD-10-CM | POA: Diagnosis not present

## 2022-05-16 DIAGNOSIS — E1122 Type 2 diabetes mellitus with diabetic chronic kidney disease: Secondary | ICD-10-CM | POA: Diagnosis not present

## 2022-05-16 DIAGNOSIS — I5032 Chronic diastolic (congestive) heart failure: Secondary | ICD-10-CM | POA: Insufficient documentation

## 2022-05-16 DIAGNOSIS — I472 Ventricular tachycardia, unspecified: Secondary | ICD-10-CM | POA: Insufficient documentation

## 2022-05-16 DIAGNOSIS — I4729 Other ventricular tachycardia: Secondary | ICD-10-CM

## 2022-05-16 DIAGNOSIS — E785 Hyperlipidemia, unspecified: Secondary | ICD-10-CM | POA: Diagnosis not present

## 2022-05-16 DIAGNOSIS — I130 Hypertensive heart and chronic kidney disease with heart failure and stage 1 through stage 4 chronic kidney disease, or unspecified chronic kidney disease: Secondary | ICD-10-CM | POA: Insufficient documentation

## 2022-05-16 DIAGNOSIS — Z7982 Long term (current) use of aspirin: Secondary | ICD-10-CM | POA: Diagnosis not present

## 2022-05-16 DIAGNOSIS — Z7902 Long term (current) use of antithrombotics/antiplatelets: Secondary | ICD-10-CM | POA: Diagnosis not present

## 2022-05-16 DIAGNOSIS — Z91148 Patient's other noncompliance with medication regimen for other reason: Secondary | ICD-10-CM | POA: Diagnosis not present

## 2022-05-16 DIAGNOSIS — F1910 Other psychoactive substance abuse, uncomplicated: Secondary | ICD-10-CM | POA: Insufficient documentation

## 2022-05-16 LAB — BASIC METABOLIC PANEL
Anion gap: 9 (ref 5–15)
BUN: 58 mg/dL — ABNORMAL HIGH (ref 6–20)
CO2: 20 mmol/L — ABNORMAL LOW (ref 22–32)
Calcium: 9.1 mg/dL (ref 8.9–10.3)
Chloride: 110 mmol/L (ref 98–111)
Creatinine, Ser: 2.96 mg/dL — ABNORMAL HIGH (ref 0.44–1.00)
GFR, Estimated: 18 mL/min — ABNORMAL LOW (ref >60)
Glucose, Bld: 69 mg/dL — ABNORMAL LOW (ref 70–99)
Potassium: 3.3 mmol/L — ABNORMAL LOW (ref 3.5–5.1)
Sodium: 139 mmol/L (ref 135–145)

## 2022-05-16 MED ORDER — HYDRALAZINE HCL 25 MG PO TABS: 25.0000 mg | ORAL_TABLET | Freq: Three times a day (TID) | ORAL | 3 refills | Status: DC

## 2022-05-16 NOTE — Patient Instructions (Addendum)
EKG done today.  Labs done today. We will contact you only if your labs are abnormal.  RESTART Hydralazine 25mg  (1 tablet) by mouth 3 times daily.   No other medication changes were made. Please continue all current medications as prescribed.  You have been referred to the sleep center. They will contact you to schedule an appointment.   Your physician recommends that you keep your scheduled follow-up appointment.  If you have any questions or concerns before your next appointment please send [place] a message through [place] or call our office at [phone].    TO LEAVE A MESSAGE FOR THE NURSE SELECT OPTION 2, PLEASE LEAVE A MESSAGE INCLUDING: YOUR NAME DATE OF BIRTH CALL BACK NUMBER REASON FOR CALL**this is important as we prioritize the call backs  YOU WILL RECEIVE A CALL BACK THE SAME DAY AS LONG AS YOU CALL BEFORE 4:00 PM   Do the following things EVERYDAY: Weigh yourself in the morning before breakfast. Write it down and keep it in a log. Take your medicines as prescribed Eat low salt foods--Limit salt (sodium) to 2000 mg per day.  Stay as active as you can everyday Limit all fluids for the day to less than 2 liters   At the [facility], you and your health needs are our priority. As part of our continuing mission to provide you with exceptional heart care, we have created designated Provider Care Teams. These Care Teams include your primary Cardiologist (physician) and Advanced Practice Providers (APPs- Physician Assistants and Nurse Practitioners) who all work together to provide you with the care you need, when you need it.   You may see any of the following providers on your designated Care Team at your next follow up: Dr Glori Bickers Dr Haynes Kerns, NP Lyda Jester, [place] Audry Riles, PharmD   Please be sure to bring in all your medications bottles to every appointment.

## 2022-05-16 NOTE — Progress Notes (Signed)
Advanced Heart Failure Clinic Note  PCP: Coralie Carpen  NP  Primary Cardiologist: Dr Phineas Inches HF Cardiologist: Dr. Haroldine Laws  HPI: 61 y.o. female with history of HTN, HLD, DM, crack/cocaine use, ETOH, tobacco abuse, CKD III, hx of sarcoidosis involving liver (previously followed by Hepatology at [facility], no-showed follow-ups). Limited reading ability.    Admitted in April 2023 with NSTEMI and uncontrolled HTN. Echo: EF 60-65%, mild LVH, grade I DD, RV okay. LHC demonstrated occluded small subranch off right PDA (treated medically), mild disease in LAD and RCA. Developed AKI post cath with Scr peaking at 3.2.    Readmitted in May 2023 with episodes of hypoglycemia. Received IV fluids. Developed volume overload and was diuresed. BP meds adjusted d/t hypertensive urgency.   Readmitted 06/23 with CP and hypertensive emergency. Minimally elevated HS troponin with flat trend. CTA chest negative for aortic dissection. CP atypical for angina. UDS + cocaine. Had been using crack cocaine since April d/t stress and using ibuprofen daily. She was volume overloaded and diuresed.BP rapidly corrected and became hypotensive. Had recurrent AKI and required iHD. Renal function improved enough to remove HD cath prior to discharge.     Admitted again 07/23 with decreased responsiveness and hypoglycemia. Had not been taking medications at home. She was volume overloaded and diuresised with IV lasix. Transitioned to torsemide 20 mg daily. Scr upper 2s - 3s during admission.   Unfortunately admitted again 08/03-08/08/23 with a/c CHF and recent cocaine use.  She was seen in HF [facility] clinic 04/04/22. Volume overloaded. Torsemide was increased 40 mg x4 days then 20 torsemide. Hydralazine was increased to 25 mg three times a day. Referred to HF Clinic and HF Paramedicine.  Follow up 9/23, NYHA III symptoms & orthostatic. Instructed to hold torsemide x 2 days then resume at lower dose 10 mg daily.  Seen in ED 05/09/22  with CP after using cocaine. Also with a/c CHF, concern for medication noncompliance. Given IV lasix with improvement in symptoms. Hydralazine stopped due to low BP.   Today she returns for HF follow up. Overall feeling fine. She feels palpitations and has positional dizziness. Denies abnormal bleeding, CP, edema, or PND/Orthopnea. Appetite ok. No fever or chills. Weight at home 181 pounds. Taking all medications. Last used crack 1 month ago, smokes 1 cig/day. Lives alone. Limited reading ability. Followed by paramedicine. Fiance says she snores and she has daytime sleepiness.  Cardiac Studies - Echo (5/23): EF 60-65%, mild LVH, grade I DD, RV okay.   - LHC (5/23): demonstrated occluded small subranch off right PDA (treated medically), mild disease in LAD and RCA.  ROS: All systems negative except as listed in HPI, PMH and Problem List.  SH:  Social History   Socioeconomic History   Marital status: Single    Spouse name: Not on file   Number of children: 3   Years of education: Not on file   Highest education level: Not on file  Occupational History   Not on file  Tobacco Use   Smoking status: Some Days    Packs/day: 0.25    Years: 3.00    Total pack years: 0.75    Types: Cigarettes   Smokeless tobacco: Never  Vaping Use   Vaping Use: Never used  Substance and Sexual Activity   Alcohol use: Yes    Alcohol/week: 0.0 standard drinks of alcohol    Comment: occasional   Drug use: No    Comment: none for 13 years   Sexual activity:  Not on file  Other Topics Concern   Not on file  Social History Narrative   Not on file   Social Determinants of Health   Financial Resource Strain: Low Risk  (04/04/2022)   Overall Financial Resource Strain (CARDIA)    Difficulty of Paying Living Expenses: Not very hard  Food Insecurity: No Food Insecurity (04/04/2022)   Hunger Vital Sign    Worried About Running Out of Food in the Last Year: Never true    Ran Out of Food in the Last Year:  Never true  Transportation Needs: No Transportation Needs (04/04/2022)   PRAPARE - Hydrologist (Medical): No    Lack of Transportation (Non-Medical): No  Physical Activity: Not on file  Stress: Not on file  Social Connections: Not on file  Intimate Partner Violence: Not on file    FH:  Family History  Problem Relation Age of Onset   Diabetes Mother    Kidney disease Mother    Seizures Father    Hypertension Father    Stroke Father    Asthma Other     Past Medical History:  Diagnosis Date   Abnormal liver function tests    Acute kidney injury ([place]) 01/2014   Hospitalized, Volume depletion, nausea and vomiting   Anxiety    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   Chronic back pain    Depression    Diabetes mellitus    GERD (gastroesophageal reflux disease)    Heart murmur    Hypertension    NSTEMI (non-ST elevated myocardial infarction) ([place]) 12/20/2021   Palpitations    Parkinson's disease ([place])     Current Outpatient Medications  Medication Sig Dispense Refill   albuterol (PROVENTIL HFA;VENTOLIN HFA) 108 (90 Base) MCG/ACT inhaler Inhale 2 puffs into the lungs every 4 (four) hours as needed for wheezing or shortness of breath.     aspirin EC 81 MG tablet Take 1 tablet (81 mg total) by mouth daily. Swallow whole. 30 tablet 0   atorvastatin (LIPITOR) 80 MG tablet Take 1 tablet (80 mg total) by mouth daily. 30 tablet 0   budesonide-formoterol (SYMBICORT) 160-4.5 MCG/ACT inhaler Inhale 2 puffs into the lungs every morning.     cetirizine (ZYRTEC) 10 MG tablet Take 10 mg by mouth daily as needed for allergies.     clopidogrel (PLAVIX) 75 MG tablet Take 1 tablet (75 mg total) by mouth daily. 30 tablet 0   gabapentin (NEURONTIN) 100 MG capsule Take 2 capsules (200 mg total) by mouth 2 (two) times daily. 60 capsule 0   glimepiride (AMARYL) 2 MG tablet Take 2 tablets (4 mg total) by mouth 2 (two) times daily. 120 tablet 0   insulin glargine  (LANTUS) 100 UNIT/ML Solostar Pen Inject 5 Units into the skin at bedtime. 15 mL 0   Insulin Pen Needle (PEN NEEDLES) 32G X 4 MM MISC 1 Units by Does not apply route daily. 100 each 0   isosorbide mononitrate (IMDUR) 30 MG 24 hr tablet Take 1/2 tablet (15 mg total) by mouth daily. 15 tablet 0   Nebivolol HCl 20 MG TABS Take 1 tablet (20 mg total) by mouth every morning. 30 tablet 0   nitroGLYCERIN (NITROSTAT) 0.4 MG SL tablet Place 1 tablet (0.4 mg total) under the tongue every 5 (five) minutes as needed for chest pain. 30 tablet 0   pantoprazole (PROTONIX) 40 MG tablet Take 1 tablet (40 mg total) by mouth daily at 12 noon. [place]  tablet 0   sertraline (ZOLOFT) 50 MG tablet Take 1 tablet (50 mg total) by mouth every evening. 30 tablet 0   torsemide (DEMADEX) 10 MG tablet Take 1 tablet (10 mg total) by mouth daily. RESTART on Saturday 05/13/22. 30 tablet 0   amLODipine (NORVASC) 10 MG tablet Take 1 tablet (10 mg total) by mouth daily. (Patient not taking: Reported on 05/15/2022) 30 tablet 0   ergocalciferol (VITAMIN D2) 1.25 MG (50000 UT) capsule Take 1 capsule (50,000 Units total) by mouth once a week. Wednesday (Patient not taking: Reported on 05/15/2022) 30 capsule 0   QUEtiapine (SEROQUEL XR) 300 MG 24 hr tablet Take 1 tablet (300 mg total) by mouth at bedtime. (Patient not taking: Reported on 05/16/2022) 30 tablet 0   No current facility-administered medications for this encounter.   BP (!) 166/84   Pulse 68   Wt 82.6 kg (182 lb 3.2 oz)   SpO2 100%   BMI 31.27 kg/m   Wt Readings from Last 3 Encounters:  05/16/22 82.6 kg (182 lb 3.2 oz)  05/15/22 82.1 kg (181 lb)  05/11/22 85.5 kg (188 lb 9.6 oz)   PHYSICAL EXAM: General:  NAD. No resp difficulty, walked into clinic HEENT: Normal Neck: Supple. No JVD. Carotids 2+ bilat; no bruits. No lymphadenopathy or thryomegaly appreciated. Cor: PMI nondisplaced. Regular rate & rhythm. No rubs, gallops or murmurs. Lungs: Clear Abdomen: Soft, nontender,  nondistended. No hepatosplenomegaly. No bruits or masses. Good bowel sounds. Extremities: No cyanosis, clubbing, rash, edema Neuro: Alert & oriented x 3, cranial nerves grossly intact. Moves all 4 extremities w/o difficulty. Affect pleasant.  ECG (personally reviewed): NSR, LVH 65 bpm, QTc 505 msec.  ASSESSMENT & PLAN: 1. HFpEF - Echo 01/2014: EF 65%, moderate LVH, RV okay - Echo 5/23: EF 60-65%, mild LVH, grade I DD, RV okay, mild LAE - Multiple admissions over the last 6 months with a/c CHF, frequently in setting of hypertensive emergency, cocaine use and medication noncompliance - She does have history of sarcoidosis of liver. Has recurrent CHF exacerbations and runs of NSVT noted on tele during recent admits. Need to rule out cardiac sarcoidosis.  - cMRI set up 06/2022. If abnormal, will need cardiac PET.  - GDMT limited by CKD. No ARNi/ARB or MRA. - NYHA II-early III. Volume OK today. - Restart hydralazine 25 mg tid for afterload reduction. - Continue Imdur 15 mg daily. - Continue torsemide 10 mg daily. - Continue nebivolol 20 mg daily. - BMET today.  2. Hypertension - Elevated today. - Restart hydralazine as above.   3. CAD - NSTEMI (4/23). LHC with occluded small subranch of PDA (treated medically) - Continue aspirin, plavix, statin +beta blocker - No chest pain.    4. CKD IV - Multiple recurrent AKIs recently. Briefly required HD in 01/2022.  - New Scr baseline ~ 2.5 - She has been referred to nephrology  - Labs today.   5. Substance abuse - Continues to use crack cocaine. - Recommended complete cessation   6. Sarcoidosis of liver - Previously followed by hepatology at [facility]. No showed subsequent follow-ups.  - Ruling out cardiac sarcoid as above. - CT chest 06/23 with small mediastinal and paratracheal nodes, mild subpleural reticular interstitial changes.  - She has been referred to Pulmonary.   7. NSVT - Wore Zio for 2 days. Asked her to mail in [place]  beta blocker.  8. Daytime fatigue/snoring - Arrange in lab sleep study  9. Limited Reading Ability/SDOH - Finished  12 grade but didn't pass test so she did not receive diploma. She was in special needs classes. - Continue paramedicine, appreciate their assistance.  Follow up with Dr. Haroldine Laws in 2 months, as scheduled.  Carol Bo Soren Pigman FNP-BC  2:05 PM

## 2022-05-16 NOTE — Progress Notes (Signed)
Paramedicine Encounter   Patient ID: Carol [name] , female,   DOB: 1960-10-31,61 y.o.,  MRN: [id number]   Met patient in clinic today with provider.  Time spent with patient 35 minutes Met with pt today in clinic.  Home visit was w/ med rec was done yesterday.  Today, Jessica Millford added back Hydralazine 66m.  TID to her med regimen.   Got a message late this afternoon that Ms. Bevens's Potassium is low.  Instructed to  Stop torsemide.  Take 40 KCL  x 1 today.  Relayed this message to CScottwho will follow up with [facility] clinic tomorrow to confirm orders.   DRenee Ramus [place]9/27/2023

## 2022-05-17 ENCOUNTER — Other Ambulatory Visit (HOSPITAL_COMMUNITY): Payer: Self-pay

## 2022-05-17 NOTE — Progress Notes (Signed)
Came out for med rec to remove torsemide from pill box nad to instruct her to take 53meq of her potassium. So that change was made today.   Carol [name], [place] 05/17/2022

## 2022-05-23 ENCOUNTER — Telehealth (HOSPITAL_COMMUNITY): Payer: Self-pay | Admitting: Emergency Medicine

## 2022-05-23 ENCOUNTER — Other Ambulatory Visit (HOSPITAL_COMMUNITY): Payer: Self-pay | Admitting: Emergency Medicine

## 2022-05-23 NOTE — Progress Notes (Signed)
Went to Ms. Fayad's house for scheduled home visit.  Knocked on the door for several minutes.  Attempted to call on the phone w/ no answer.  LVM for her to call me to reschedule visit.    [name], [place] 05/23/2022

## 2022-05-23 NOTE — Telephone Encounter (Signed)
Called and left voicemail advising her to call me to reschedule home visit.  Will reach out tomorrow    [name], [place] 05/23/2022

## 2022-05-23 NOTE — Telephone Encounter (Signed)
Called w/ no answer did not leave message

## 2022-05-24 ENCOUNTER — Telehealth (HOSPITAL_COMMUNITY): Payer: Self-pay | Admitting: Emergency Medicine

## 2022-05-24 NOTE — Telephone Encounter (Signed)
Called w/ no answer. LVM following up from yesterday's missed visit.  Requested her to call me back to schedule and appointment.

## 2022-05-24 NOTE — Telephone Encounter (Signed)
Called Mr. Archie Balboa in attempt to reach [name] and asked him to get her to give me a call so that we can schedule a home visit.    [name], [place] 05/24/2022

## 2022-05-25 ENCOUNTER — Telehealth (HOSPITAL_COMMUNITY): Payer: Self-pay | Admitting: Emergency Medicine

## 2022-05-25 NOTE — Telephone Encounter (Signed)
Called Ms. Washer and LVM saying I am trying to reach her to do a visit with med reconciliation.  Left my number for her to call me back.    [name], [place] 05/25/2022

## 2022-05-28 ENCOUNTER — Telehealth (HOSPITAL_COMMUNITY): Payer: Self-pay | Admitting: *Deleted

## 2022-05-28 NOTE — Telephone Encounter (Signed)
Called patient per Carol Katz, NP with following:  K is low. Kidney function remains elevated.  Stop torsemide.  Take 40 KCL x 1 today.  Repeat BMET and BNP in 1 week - appointment scheduled.  Pt verbalized understanding of above.

## 2022-05-29 ENCOUNTER — Telehealth (HOSPITAL_COMMUNITY): Payer: Self-pay | Admitting: Emergency Medicine

## 2022-05-29 NOTE — Telephone Encounter (Signed)
Called Ms. Crites and LVM for her to call me back to schedule home visit.      [name], [place] 05/29/2022

## 2022-05-29 NOTE — Telephone Encounter (Signed)
[name] called me on Monday and left VM that she would like to set up a visit to get help with her meds.    I do not work on Mondays.  Will call her back 05/29/22.    [name], [place] 05/29/2022

## 2022-05-30 ENCOUNTER — Other Ambulatory Visit (HOSPITAL_COMMUNITY): Payer: Self-pay | Admitting: Emergency Medicine

## 2022-05-30 NOTE — Progress Notes (Signed)
Paramedicine Encounter    Patient ID: Carol [name], female    DOB: Feb 21, 1961, 61 y.o.   MRN: [id number]   BP (!) 160/100 (BP Location: Right Arm, Patient Position: Sitting, Cuff Size: Normal)   Pulse 76   Resp 16   Wt 184 lb 12.8 oz (83.8 kg)   SpO2 99%   BMI 31.72 kg/m  Weight yesterday-not taken Last visit weight-182lb  ATF Ms. [place] A&O x 4, skin W&D w/ good color.  Pt. Had recent med changes to d/c Torsemide and take 40mg  Kcl  She advised she did as instructed yesterday.  She denies chest pain or SOB.  Lung sounds clear and equal bilat.  No edema noted.  Pt. Was hypertensive @ 160/100.  She has not taken her morning medications yet.   Pill box reconciled x 1 week.  Her appointments were reviewed and she will have her paramedicine appointments on Wednesdays.   Home visit complete    Skipper Cliche [phone] 05/30/2022  Patient Care Team: Simona Huh, NP as PCP - General (Nurse Practitioner) Janina Mayo, MD as PCP - Cardiology (Cardiology)  Patient Active Problem List   Diagnosis Date Noted   Acute exacerbation of CHF (congestive heart failure) ([place]) 05/09/2022   High anion gap metabolic acidosis 05/12/3006   Chest pain, atypical 05/09/2022   Acute on chronic diastolic (congestive) heart failure ([place]) 03/22/2022   COPD not affecting current episode of care 03/22/2022   Class 2 obesity due to excess calories with body mass index (BMI) of 37.0 to 37.9 in adult 03/22/2022   CHF (congestive heart failure) ([place]) 03/14/2022   CKD (chronic kidney disease), stage IV ([place]) 03/10/2022   Hypertensive urgency 01/22/2022   Acute kidney injury superimposed on chronic kidney disease ([place]) 01/22/2022   Acute on chronic diastolic CHF (congestive heart failure) ([place]) 01/22/2022   Prolonged QT interval 01/22/2022   Elevated troponin 01/22/2022   Thyroid nodule 01/22/2022   Cocaine abuse ([place]) 01/02/2022   Diabetic hypoglycemia ([place]) 12/31/2021   CKD  (chronic kidney disease) stage 4, GFR 15-29 ml/min ([place]) 12/31/2021   CAD in native artery 12/24/2021   Anxiety with depression 12/24/2021   Pure hypercholesterolemia    Colon cancer screening 02/18/2014   Diabetes mellitus ([place]) 01/29/2014   Essential hypertension 01/29/2014   Closed jaw fracture ([place]) 01/29/2014   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    Current Outpatient Medications:    albuterol (PROVENTIL HFA;VENTOLIN HFA) 108 (90 Base) MCG/ACT inhaler, Inhale 2 puffs into the lungs every 4 (four) hours as needed for wheezing or shortness of breath., Disp: , Rfl:    amLODipine (NORVASC) 10 MG tablet, Take 1 tablet (10 mg total) by mouth daily., Disp: 30 tablet, Rfl: 0   aspirin EC 81 MG tablet, Take 1 tablet (81 mg total) by mouth daily. Swallow whole., Disp: 30 tablet, Rfl: 0   atorvastatin (LIPITOR) 80 MG tablet, Take 1 tablet (80 mg total) by mouth daily., Disp: 30 tablet, Rfl: 0   budesonide-formoterol (SYMBICORT) 160-4.5 MCG/ACT inhaler, Inhale 2 puffs into the lungs every morning., Disp: , Rfl:    cetirizine (ZYRTEC) 10 MG tablet, Take 10 mg by mouth daily as needed for allergies., Disp: , Rfl:    clopidogrel (PLAVIX) 75 MG tablet, Take 1 tablet (75 mg total) by mouth daily., Disp: 30 tablet, Rfl: 0   ergocalciferol (VITAMIN D2) 1.25 MG (50000 UT) capsule, Take 1 capsule (50,000 Units total) by mouth once a week. Wednesday, Disp: 30 capsule,  Rfl: 0   gabapentin (NEURONTIN) 100 MG capsule, Take 2 capsules (200 mg total) by mouth 2 (two) times daily., Disp: 60 capsule, Rfl: 0   glimepiride (AMARYL) 2 MG tablet, Take 2 tablets (4 mg total) by mouth 2 (two) times daily., Disp: 120 tablet, Rfl: 0   hydrALAZINE (APRESOLINE) 25 MG tablet, Take 1 tablet (25 mg total) by mouth 3 (three) times daily., Disp: 270 tablet, Rfl: 3   insulin glargine (LANTUS) 100 UNIT/ML Solostar Pen, Inject 5 Units into the skin at bedtime., Disp: 15 mL, Rfl: 0   Insulin Pen Needle (PEN NEEDLES)  32G X 4 MM MISC, 1 Units by Does not apply route daily., Disp: 100 each, Rfl: 0   isosorbide mononitrate (IMDUR) 30 MG 24 hr tablet, Take 1/2 tablet (15 mg total) by mouth daily., Disp: 15 tablet, Rfl: 0   Nebivolol HCl 20 MG TABS, Take 1 tablet (20 mg total) by mouth every morning., Disp: 30 tablet, Rfl: 0   pantoprazole (PROTONIX) 40 MG tablet, Take 1 tablet (40 mg total) by mouth daily at 12 noon., Disp: 30 tablet, Rfl: 0   QUEtiapine (SEROQUEL XR) 300 MG 24 hr tablet, Take 1 tablet (300 mg total) by mouth at bedtime., Disp: 30 tablet, Rfl: 0   sertraline (ZOLOFT) 50 MG tablet, Take 1 tablet (50 mg total) by mouth every evening., Disp: 30 tablet, Rfl: 0   nitroGLYCERIN (NITROSTAT) 0.4 MG SL tablet, Place 1 tablet (0.4 mg total) under the tongue every 5 (five) minutes as needed for chest pain. (Patient not taking: Reported on 05/30/2022), Disp: 30 tablet, Rfl: 0 Allergies  Allergen Reactions   Penicillins Shortness Of Breath and Other (See Comments)    Caused yeast infection Has patient had a PCN reaction causing immediate rash, facial/tongue/throat swelling, SOB or lightheadedness with hypotension: Yes Has patient had a PCN reaction causing severe rash involving mucus membranes or skin necrosis: No Has patient had a PCN reaction that required hospitalization pt was in the hospital at the time of the reaction Has patient had a PCN reaction occurring within the last 10 years: No If all of the above answers are "NO", then may proceed with Cephalosp   Ultram [Tramadol] Other (See Comments)    Made her tongue raw      Social History   Socioeconomic History   Marital status: Single    Spouse name: Not on file   Number of children: 3   Years of education: Not on file   Highest education level: Not on file  Occupational History   Not on file  Tobacco Use   Smoking status: Some Days    Packs/day: 0.25    Years: 3.00    Total pack years: 0.75    Types: Cigarettes   Smokeless tobacco:  Never  Vaping Use   Vaping Use: Never used  Substance and Sexual Activity   Alcohol use: Yes    Alcohol/week: 0.0 standard drinks of alcohol    Comment: occasional   Drug use: No    Comment: none for 13 years   Sexual activity: Not on file  Other Topics Concern   Not on file  Social History Narrative   Not on file   Social Determinants of Health   Financial Resource Strain: Low Risk  (04/04/2022)   Overall Financial Resource Strain (CARDIA)    Difficulty of Paying Living Expenses: Not very hard  Food Insecurity: No Food Insecurity (04/04/2022)   Hunger Vital Sign    Worried  About Running Out of Food in the Last Year: Never true    Ran Out of Food in the Last Year: Never true  Transportation Needs: No Transportation Needs (04/04/2022)   PRAPARE - Hydrologist (Medical): No    Lack of Transportation (Non-Medical): No  Physical Activity: Not on file  Stress: Not on file  Social Connections: Not on file  Intimate Partner Violence: Not on file    Physical Exam      Future Appointments  Date Time Provider [place]  06/01/2022  3:15 PM Juanito Doom, MD [facility] None  06/08/2022 12:30 PM [facility] None  06/20/2022  2:00 PM Bensimhon, Shaune Pascal, MD [facility] None  06/29/2022  9:00 AM [facility] 1 [facility] [place], [facility] Paramedic  05/30/22

## 2022-06-01 ENCOUNTER — Institutional Professional Consult (permissible substitution): Payer: Medicare Other | Admitting: Pulmonary Disease

## 2022-06-01 NOTE — Progress Notes (Deleted)
Synopsis: Referred in October 2023 for sarcoidosis.  Has a history of liver biopsy in 2016 showing noncaseating granulomas.  [facility].  Has been seen by the advanced heart failure service several times over the last 2 years in the setting of multiple hospitalizations for decompensated heart failure after cocaine use.  Referred to pulmonary medicine in 2023 for sarcoidosis.  Subjective:   PATIENT ID: Carol [name] GENDER: female DOB: 1961-07-25, MRN: [id number]   HPI  No chief complaint on file.   *** Record review: September 2023 advanced heart failure clinic note reviewed where the patient was seen for diastolic dysfunction, recurrent cocaine use.  Concern raised for possible cardiac sarcoid, cardiac MRI arranged for November 2023  Past Medical History:  Diagnosis Date   Abnormal liver function tests    Acute kidney injury ([place]) 01/2014   Hospitalized, Volume depletion, nausea and vomiting   Anxiety    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   Chronic back pain    Depression    Diabetes mellitus    GERD (gastroesophageal reflux disease)    Heart murmur    Hypertension    NSTEMI (non-ST elevated myocardial infarction) ([place]) 12/20/2021   Palpitations    Parkinson's disease ([place])      Family History  Problem Relation Age of Onset   Diabetes Mother    Kidney disease Mother    Seizures Father    Hypertension Father    Stroke Father    Asthma Other      Social History   Socioeconomic History   Marital status: Single    Spouse name: Not on file   Number of children: 3   Years of education: Not on file   Highest education level: Not on file  Occupational History   Not on file  Tobacco Use   Smoking status: Some Days    Packs/day: 0.25    Years: 3.00    Total pack years: 0.75    Types: Cigarettes   Smokeless tobacco: Never  Vaping Use   Vaping Use: Never used  Substance and Sexual Activity    Alcohol use: Yes    Alcohol/week: 0.0 standard drinks of alcohol    Comment: occasional   Drug use: No    Comment: none for 13 years   Sexual activity: Not on file  Other Topics Concern   Not on file  Social History Narrative   Not on file   Social Determinants of Health   Financial Resource Strain: Low Risk  (04/04/2022)   Overall Financial Resource Strain (CARDIA)    Difficulty of Paying Living Expenses: Not very hard  Food Insecurity: No Food Insecurity (04/04/2022)   Hunger Vital Sign    Worried About Running Out of Food in the Last Year: Never true    Ran Out of Food in the Last Year: Never true  Transportation Needs: No Transportation Needs (04/04/2022)   PRAPARE - Hydrologist (Medical): No    Lack of Transportation (Non-Medical): No  Physical Activity: Not on file  Stress: Not on file  Social Connections: Not on file  Intimate Partner Violence: Not on file     Allergies  Allergen Reactions   Penicillins Shortness Of Breath and Other (See Comments)    Caused yeast infection Has patient had a PCN reaction causing immediate rash, facial/tongue/throat swelling, SOB or lightheadedness with hypotension: Yes Has patient had a PCN reaction causing severe rash involving mucus  membranes or skin necrosis: No Has patient had a PCN reaction that required hospitalization pt was in the hospital at the time of the reaction Has patient had a PCN reaction occurring within the last 10 years: No If all of the above answers are "NO", then may proceed with Cephalosp   Ultram [Tramadol] Other (See Comments)    Made her tongue raw     Outpatient Medications Prior to Visit  Medication Sig Dispense Refill   albuterol (PROVENTIL HFA;VENTOLIN HFA) 108 (90 Base) MCG/ACT inhaler Inhale 2 puffs into the lungs every 4 (four) hours as needed for wheezing or shortness of breath.     amLODipine (NORVASC) 10 MG tablet Take 1 tablet (10 mg total) by mouth daily. 30 tablet  0   aspirin EC 81 MG tablet Take 1 tablet (81 mg total) by mouth daily. Swallow whole. 30 tablet 0   atorvastatin (LIPITOR) 80 MG tablet Take 1 tablet (80 mg total) by mouth daily. 30 tablet 0   budesonide-formoterol (SYMBICORT) 160-4.5 MCG/ACT inhaler Inhale 2 puffs into the lungs every morning.     cetirizine (ZYRTEC) 10 MG tablet Take 10 mg by mouth daily as needed for allergies.     clopidogrel (PLAVIX) 75 MG tablet Take 1 tablet (75 mg total) by mouth daily. 30 tablet 0   ergocalciferol (VITAMIN D2) 1.25 MG (50000 UT) capsule Take 1 capsule (50,000 Units total) by mouth once a week. Wednesday 30 capsule 0   gabapentin (NEURONTIN) 100 MG capsule Take 2 capsules (200 mg total) by mouth 2 (two) times daily. 60 capsule 0   glimepiride (AMARYL) 2 MG tablet Take 2 tablets (4 mg total) by mouth 2 (two) times daily. 120 tablet 0   hydrALAZINE (APRESOLINE) 25 MG tablet Take 1 tablet (25 mg total) by mouth 3 (three) times daily. 270 tablet 3   insulin glargine (LANTUS) 100 UNIT/ML Solostar Pen Inject 5 Units into the skin at bedtime. 15 mL 0   Insulin Pen Needle (PEN NEEDLES) 32G X 4 MM MISC 1 Units by Does not apply route daily. 100 each 0   isosorbide mononitrate (IMDUR) 30 MG 24 hr tablet Take 1/2 tablet (15 mg total) by mouth daily. 15 tablet 0   Nebivolol HCl 20 MG TABS Take 1 tablet (20 mg total) by mouth every morning. 30 tablet 0   nitroGLYCERIN (NITROSTAT) 0.4 MG SL tablet Place 1 tablet (0.4 mg total) under the tongue every 5 (five) minutes as needed for chest pain. (Patient not taking: Reported on 05/30/2022) 30 tablet 0   pantoprazole (PROTONIX) 40 MG tablet Take 1 tablet (40 mg total) by mouth daily at 12 noon. 30 tablet 0   QUEtiapine (SEROQUEL XR) 300 MG 24 hr tablet Take 1 tablet (300 mg total) by mouth at bedtime. 30 tablet 0   sertraline (ZOLOFT) 50 MG tablet Take 1 tablet (50 mg total) by mouth every evening. 30 tablet 0   No facility-administered medications prior to visit.     ROS    Objective:  Physical Exam   There were no vitals filed for this visit.  ***  CBC    Component Value Date/Time   WBC 5.1 05/10/2022 0643   RBC 3.47 (L) 05/10/2022 0643   HGB 9.5 (L) 05/10/2022 0643   HCT 28.6 (L) 05/10/2022 0643   PLT 221 05/10/2022 0643   MCV 82.4 05/10/2022 0643   MCH 27.4 05/10/2022 0643   MCHC 33.2 05/10/2022 0643   RDW 15.9 (H) 05/10/2022 6301  LYMPHSABS 1.2 03/22/2022 0712   MONOABS 1.4 (H) 03/22/2022 0712   EOSABS 0.2 03/22/2022 0712   BASOSABS 0.0 03/22/2022 5809     Chest imaging: June 2023 CT angiogram aorta chest showed no acute aortic findings, aortic atherosclerosis, cholelithiasis mild cardiomegaly and left ventricular hypertrophy, 2.7 left thyroid nodule, hepatic steatosis, subpleural reticular and interstitial accentuation's of the lungs  PFT:  Labs:  Path: 2016 liver biopsy showed noncaseating granulomas per [facility]  Echo: May 2023 echocardiogram LVEF 60 to 65%, mild LVH, grade 1 diastolic dysfunction, RV size and function normal  Heart Catheterization: May 2023 left heart cath small occluded subbranch of right PDA, mild disease in LAD and RCA      Assessment & Plan:   No diagnosis found.  Discussion: ***  Immunizations: Immunization History  Administered Date(s) Administered   Tdap 08/31/2015     Current Outpatient Medications:    albuterol (PROVENTIL HFA;VENTOLIN HFA) 108 (90 Base) MCG/ACT inhaler, Inhale 2 puffs into the lungs every 4 (four) hours as needed for wheezing or shortness of breath., Disp: , Rfl:    amLODipine (NORVASC) 10 MG tablet, Take 1 tablet (10 mg total) by mouth daily., Disp: 30 tablet, Rfl: 0   aspirin EC 81 MG tablet, Take 1 tablet (81 mg total) by mouth daily. Swallow whole., Disp: 30 tablet, Rfl: 0   atorvastatin (LIPITOR) 80 MG tablet, Take 1 tablet (80 mg total) by mouth daily., Disp: 30 tablet, Rfl: 0   budesonide-formoterol (SYMBICORT) 160-4.5  MCG/ACT inhaler, Inhale 2 puffs into the lungs every morning., Disp: , Rfl:    cetirizine (ZYRTEC) 10 MG tablet, Take 10 mg by mouth daily as needed for allergies., Disp: , Rfl:    clopidogrel (PLAVIX) 75 MG tablet, Take 1 tablet (75 mg total) by mouth daily., Disp: 30 tablet, Rfl: 0   ergocalciferol (VITAMIN D2) 1.25 MG (50000 UT) capsule, Take 1 capsule (50,000 Units total) by mouth once a week. Wednesday, Disp: 30 capsule, Rfl: 0   gabapentin (NEURONTIN) 100 MG capsule, Take 2 capsules (200 mg total) by mouth 2 (two) times daily., Disp: 60 capsule, Rfl: 0   glimepiride (AMARYL) 2 MG tablet, Take 2 tablets (4 mg total) by mouth 2 (two) times daily., Disp: 120 tablet, Rfl: 0   hydrALAZINE (APRESOLINE) 25 MG tablet, Take 1 tablet (25 mg total) by mouth 3 (three) times daily., Disp: 270 tablet, Rfl: 3   insulin glargine (LANTUS) 100 UNIT/ML Solostar Pen, Inject 5 Units into the skin at bedtime., Disp: 15 mL, Rfl: 0   Insulin Pen Needle (PEN NEEDLES) 32G X 4 MM MISC, 1 Units by Does not apply route daily., Disp: 100 each, Rfl: 0   isosorbide mononitrate (IMDUR) 30 MG 24 hr tablet, Take 1/2 tablet (15 mg total) by mouth daily., Disp: 15 tablet, Rfl: 0   Nebivolol HCl 20 MG TABS, Take 1 tablet (20 mg total) by mouth every morning., Disp: 30 tablet, Rfl: 0   nitroGLYCERIN (NITROSTAT) 0.4 MG SL tablet, Place 1 tablet (0.4 mg total) under the tongue every 5 (five) minutes as needed for chest pain. (Patient not taking: Reported on 05/30/2022), Disp: 30 tablet, Rfl: 0   pantoprazole (PROTONIX) 40 MG tablet, Take 1 tablet (40 mg total) by mouth daily at 12 noon., Disp: 30 tablet, Rfl: 0   QUEtiapine (SEROQUEL XR) 300 MG 24 hr tablet, Take 1 tablet (300 mg total) by mouth at bedtime., Disp: 30 tablet, Rfl: 0   sertraline (ZOLOFT) 50  MG tablet, Take 1 tablet (50 mg total) by mouth every evening., Disp: 30 tablet, Rfl: 0

## 2022-06-05 ENCOUNTER — Telehealth (HOSPITAL_COMMUNITY): Payer: Self-pay | Admitting: Emergency Medicine

## 2022-06-05 NOTE — Telephone Encounter (Signed)
Called and spoke with Dominik to remind her of home visit for tomorrow morning @ 10:30.  She agrees to same.    [name], C-Road 06/05/2022

## 2022-06-07 ENCOUNTER — Telehealth (HOSPITAL_COMMUNITY): Payer: Self-pay | Admitting: Emergency Medicine

## 2022-06-07 ENCOUNTER — Other Ambulatory Visit (HOSPITAL_COMMUNITY): Payer: Self-pay | Admitting: Emergency Medicine

## 2022-06-07 NOTE — Progress Notes (Signed)
Paramedicine Encounter    Patient ID: Carol [name], female    DOB: [birth year], 61 y.o.   MRN: [id number]   There were no vitals taken for this visit. Weight yesterday-not taken Last visit weight-184lb  Home visit with [name] today.  She reports to be feeling well.  No chest pain or SOB.  Lung sounds clear and equal bilat.  No edema to her lower extremities.  I do notice that she looks a little swollen in the face.  The last time she was holding fluid I could see it in her face.  She is up 4lbs from last weeks visit and her BP is elevated at 180/98.  She has not been compliant with her medications missing over half her doses.  I discussed her BP and weight gain with her and that the key to head this off is taking her meds as directed.  She became tearful during this conversation but I encouraged her to get back on track and I will follow up with the HF Clinic for any med changes.   Med box reconciled.  I reached out to [facility] who advised that she should take 20mg . Torsemide for the next two days and after that once weekly. Home visit complete.    [name], [place] 06/07/2022      Patient Care Team: Simona Huh, NP as PCP - General (Nurse Practitioner) Janina Mayo, MD as PCP - Cardiology (Cardiology)  Patient Active Problem List   Diagnosis Date Noted   Acute exacerbation of CHF (congestive heart failure) ([place]) 05/09/2022   High anion gap metabolic acidosis 40/98/1191   Chest pain, atypical 05/09/2022   Acute on chronic diastolic (congestive) heart failure ([place]) 03/22/2022   COPD not affecting current episode of care 03/22/2022   Class 2 obesity due to excess calories with body mass index (BMI) of 37.0 to 37.9 in adult 03/22/2022   CHF (congestive heart failure) ([place]) 03/14/2022   CKD (chronic kidney disease), stage IV ([place]) 03/10/2022   Hypertensive urgency 01/22/2022   Acute kidney injury superimposed on chronic kidney disease ([place])  01/22/2022   Acute on chronic diastolic CHF (congestive heart failure) ([place]) 01/22/2022   Prolonged QT interval 01/22/2022   Elevated troponin 01/22/2022   Thyroid nodule 01/22/2022   Cocaine abuse ([place]) 01/02/2022   Diabetic hypoglycemia ([place]) 12/31/2021   CKD (chronic kidney disease) stage 4, GFR 15-29 ml/min ([place]) 12/31/2021   CAD in native artery 12/24/2021   Anxiety with depression 12/24/2021   Pure hypercholesterolemia    Colon cancer screening 02/18/2014   Diabetes mellitus ([place]) 01/29/2014   Essential hypertension 01/29/2014   Closed jaw fracture ([place]) 01/29/2014   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    Current Outpatient Medications:    albuterol (PROVENTIL HFA;VENTOLIN HFA) 108 (90 Base) MCG/ACT inhaler, Inhale 2 puffs into the lungs every 4 (four) hours as needed for wheezing or shortness of breath., Disp: , Rfl:    amLODipine (NORVASC) 10 MG tablet, Take 1 tablet (10 mg total) by mouth daily., Disp: 30 tablet, Rfl: 0   aspirin EC 81 MG tablet, Take 1 tablet (81 mg total) by mouth daily. Swallow whole., Disp: 30 tablet, Rfl: 0   atorvastatin (LIPITOR) 80 MG tablet, Take 1 tablet (80 mg total) by mouth daily., Disp: 30 tablet, Rfl: 0   budesonide-formoterol (SYMBICORT) 160-4.5 MCG/ACT inhaler, Inhale 2 puffs into the lungs every morning., Disp: , Rfl:    cetirizine (ZYRTEC) 10 MG tablet, Take 10 mg  by mouth daily as needed for allergies., Disp: , Rfl:    clopidogrel (PLAVIX) 75 MG tablet, Take 1 tablet (75 mg total) by mouth daily., Disp: 30 tablet, Rfl: 0   ergocalciferol (VITAMIN D2) 1.25 MG (50000 UT) capsule, Take 1 capsule (50,000 Units total) by mouth once a week. Wednesday, Disp: 30 capsule, Rfl: 0   gabapentin (NEURONTIN) 100 MG capsule, Take 2 capsules (200 mg total) by mouth 2 (two) times daily., Disp: 60 capsule, Rfl: 0   glimepiride (AMARYL) 2 MG tablet, Take 2 tablets (4 mg total) by mouth 2 (two) times daily., Disp: 120 tablet, Rfl: 0   hydrALAZINE  (APRESOLINE) 25 MG tablet, Take 1 tablet (25 mg total) by mouth 3 (three) times daily., Disp: 270 tablet, Rfl: 3   insulin glargine (LANTUS) 100 UNIT/ML Solostar Pen, Inject 5 Units into the skin at bedtime., Disp: 15 mL, Rfl: 0   Insulin Pen Needle (PEN NEEDLES) 32G X 4 MM MISC, 1 Units by Does not apply route daily., Disp: 100 each, Rfl: 0   isosorbide mononitrate (IMDUR) 30 MG 24 hr tablet, Take 1/2 tablet (15 mg total) by mouth daily., Disp: 15 tablet, Rfl: 0   Nebivolol HCl 20 MG TABS, Take 1 tablet (20 mg total) by mouth every morning., Disp: 30 tablet, Rfl: 0   nitroGLYCERIN (NITROSTAT) 0.4 MG SL tablet, Place 1 tablet (0.4 mg total) under the tongue every 5 (five) minutes as needed for chest pain. (Patient not taking: Reported on 05/30/2022), Disp: 30 tablet, Rfl: 0   pantoprazole (PROTONIX) 40 MG tablet, Take 1 tablet (40 mg total) by mouth daily at 12 noon., Disp: 30 tablet, Rfl: 0   QUEtiapine (SEROQUEL XR) 300 MG 24 hr tablet, Take 1 tablet (300 mg total) by mouth at bedtime., Disp: 30 tablet, Rfl: 0   sertraline (ZOLOFT) 50 MG tablet, Take 1 tablet (50 mg total) by mouth every evening., Disp: 30 tablet, Rfl: 0 Allergies  Allergen Reactions   Penicillins Shortness Of Breath and Other (See Comments)    Caused yeast infection Has patient had a PCN reaction causing immediate rash, facial/tongue/throat swelling, SOB or lightheadedness with hypotension: Yes Has patient had a PCN reaction causing severe rash involving mucus membranes or skin necrosis: No Has patient had a PCN reaction that required hospitalization pt was in the hospital at the time of the reaction Has patient had a PCN reaction occurring within the last 10 years: No If all of the above answers are "NO", then may proceed with Cephalosp   Ultram [Tramadol] Other (See Comments)    Made her tongue raw      Social History   Socioeconomic History   Marital status: Single    Spouse name: Not on file   Number of children:  3   Years of education: Not on file   Highest education level: Not on file  Occupational History   Not on file  Tobacco Use   Smoking status: Some Days    Packs/day: 0.25    Years: 3.00    Total pack years: 0.75    Types: Cigarettes   Smokeless tobacco: Never  Vaping Use   Vaping Use: Never used  Substance and Sexual Activity   Alcohol use: Yes    Alcohol/week: 0.0 standard drinks of alcohol    Comment: occasional   Drug use: No    Comment: none for 13 years   Sexual activity: Not on file  Other Topics Concern   Not on file  Social History Narrative   Not on file   Social Determinants of Health   Financial Resource Strain: Low Risk  (04/04/2022)   Overall Financial Resource Strain (CARDIA)    Difficulty of Paying Living Expenses: Not very hard  Food Insecurity: No Food Insecurity (04/04/2022)   Hunger Vital Sign    Worried About Running Out of Food in the Last Year: Never true    Ran Out of Food in the Last Year: Never true  Transportation Needs: No Transportation Needs (04/04/2022)   PRAPARE - Hydrologist (Medical): No    Lack of Transportation (Non-Medical): No  Physical Activity: Not on file  Stress: Not on file  Social Connections: Not on file  Intimate Partner Violence: Not on file    Physical Exam      Future Appointments  Date Time Provider [place]  06/08/2022 12:30 PM [facility] LAB [facility] None  06/20/2022  2:00 PM Bensimhon, Shaune Pascal, MD [facility] None  06/29/2022  9:00 AM [facility] 1 [facility] [place], [place] [facility] Paramedic  06/07/22

## 2022-06-07 NOTE — Telephone Encounter (Signed)
Spoke with [name] to advise her of med change per [facility].  She is to take 1 20mg . Torsemide daily for the next two days and once a week after that.   [name] acknowledges she understands same.   Reminded her of her lab visit tomorrow at the clinic and she advised she was aware and she would be there.    [name], [place] 06/07/2022

## 2022-06-08 ENCOUNTER — Other Ambulatory Visit (HOSPITAL_COMMUNITY): Payer: Medicare Other

## 2022-06-14 ENCOUNTER — Other Ambulatory Visit (HOSPITAL_COMMUNITY): Payer: Self-pay | Admitting: Emergency Medicine

## 2022-06-15 ENCOUNTER — Telehealth (HOSPITAL_COMMUNITY): Payer: Self-pay | Admitting: Emergency Medicine

## 2022-06-15 NOTE — Telephone Encounter (Signed)
Left voice mail for Ms. Cisse advising I'm trying to reach her to schedule a home visit and asked her to call me back at her earliest convenience.    [name], [place] 06/15/2022

## 2022-06-15 NOTE — Progress Notes (Signed)
Arrived at Carol [name]'s residence for scheduled home visit.  She did not answer the door and did not answer when I called her by phone.  Waited approximately 12 minutes with no answer after knocking several times and calling and leaving a message as well.  Will attempt to reach her tomorrow to schedule another visit.    [name], [place] 06/15/2022

## 2022-06-19 ENCOUNTER — Telehealth (HOSPITAL_COMMUNITY): Payer: Self-pay | Admitting: Emergency Medicine

## 2022-06-19 NOTE — Telephone Encounter (Signed)
Called to attempt to do a home visit today after 12:00 since she was a no show on 11/26.  She called me back  @ 10:21 and advised she had "a family emergency" and a home visit was scheduled for Thursday @ 8:30.  I was driving when she called and when I looked at my calendar I saw she has a clinic appointment for tomorrow @ 2:00 with Dr. Sung Amabile.  I called her back to remind her of this appointment and she advised she would be there.  I requested that she bring all her meds and pill box for med reconciliation and she said she would do so.    [name], [place] 06/19/2022

## 2022-06-20 ENCOUNTER — Telehealth (HOSPITAL_COMMUNITY): Payer: Self-pay | Admitting: Emergency Medicine

## 2022-06-20 ENCOUNTER — Inpatient Hospital Stay (HOSPITAL_BASED_OUTPATIENT_CLINIC_OR_DEPARTMENT_OTHER)
Admission: RE | Admit: 2022-06-20 | Discharge: 2022-06-20 | Disposition: A | Discharge status: Home / Self Care | Payer: Medicare Other | Source: Ambulatory Visit | Attending: Internal Medicine | Admitting: Internal Medicine

## 2022-06-20 DIAGNOSIS — I5032 Chronic diastolic (congestive) heart failure: Secondary | ICD-10-CM

## 2022-06-20 NOTE — Progress Notes (Signed)
Advanced Heart Failure Clinic Note  PCP: Coralie Carpen  NP  Primary Cardiologist: Dr Phineas Inches HF Cardiologist: Dr. Haroldine Laws  HPI: 61 y.o. female with history of HTN, HLD, DM, crack/cocaine use, ETOH, tobacco abuse, CKD III, hx of sarcoidosis involving liver (previously followed by Hepatology at [place], no-showed follow-ups). Limited reading ability.    Admitted in April 2023 with NSTEMI and uncontrolled HTN. Echo: EF 60-65%, mild LVH, grade I DD, RV okay. LHC demonstrated occluded small subranch off right PDA (treated medically), mild disease in LAD and RCA. Developed AKI post cath with Scr peaking at 3.2.    Readmitted in May 2023 with episodes of hypoglycemia. Received IV fluids. Developed volume overload and was diuresed. BP meds adjusted d/t hypertensive urgency.   Readmitted 06/23 with CP and hypertensive emergency. Minimally elevated HS troponin with flat trend. CTA chest negative for aortic dissection. CP atypical for angina. UDS + cocaine. Had been using crack cocaine since April d/t stress and using ibuprofen daily. She was volume overloaded and diuresed.BP rapidly corrected and became hypotensive. Had recurrent AKI and required iHD. Renal function improved enough to remove HD cath prior to discharge.     Admitted again 07/23 with decreased responsiveness and hypoglycemia. Had not been taking medications at home. She was volume overloaded and diuresised with IV lasix. Transitioned to torsemide 20 mg daily. Scr upper 2s - 3s during admission.   Unfortunately admitted again 08/03-08/08/23 with a/c CHF and recent cocaine use.  She was seen in HF [facility] clinic 04/04/22. Volume overloaded. Torsemide was increased 40 mg x4 days then 20 torsemide. Hydralazine was increased to 25 mg three times a day. Referred to HF Clinic and HF Paramedicine.  Follow up 9/23, NYHA III symptoms & orthostatic. Instructed to hold torsemide x 2 days then resume at lower dose 10 mg daily.  Seen in ED 05/09/22  with CP after using cocaine. Also with a/c CHF, concern for medication noncompliance. Given IV lasix with improvement in symptoms. Hydralazine stopped due to low BP.   Today she returns for HF follow up. Overall feeling fine. She feels palpitations and has positional dizziness. Denies abnormal bleeding, CP, edema, or PND/Orthopnea. Appetite ok. No fever or chills. Weight at home 181 pounds. Taking all medications. Last used crack 1 month ago, smokes 1 cig/day. Lives alone. Limited reading ability. Followed by paramedicine. Fiance says she snores and she has daytime sleepiness.  Cardiac Studies - Echo (5/23): EF 60-65%, mild LVH, grade I DD, RV okay.   - LHC (5/23): demonstrated occluded small subranch off right PDA (treated medically), mild disease in LAD and RCA.  ROS: All systems negative except as listed in HPI, PMH and Problem List.  SH:  Social History   Socioeconomic History   Marital status: Single    Spouse name: Not on file   Number of children: 3   Years of education: Not on file   Highest education level: Not on file  Occupational History   Not on file  Tobacco Use   Smoking status: Some Days    Packs/day: 0.25    Years: 3.00    Total pack years: 0.75    Types: Cigarettes   Smokeless tobacco: Never  Vaping Use   Vaping Use: Never used  Substance and Sexual Activity   Alcohol use: Yes    Alcohol/week: 0.0 standard drinks of alcohol    Comment: occasional   Drug use: No    Comment: none for 13 years   Sexual activity:  Not on file  Other Topics Concern   Not on file  Social History Narrative   Not on file   Social Determinants of Health   Financial Resource Strain: Low Risk  (04/04/2022)   Overall Financial Resource Strain (CARDIA)    Difficulty of Paying Living Expenses: Not very hard  Food Insecurity: No Food Insecurity (04/04/2022)   Hunger Vital Sign    Worried About Running Out of Food in the Last Year: Never true    Ran Out of Food in the Last Year:  Never true  Transportation Needs: No Transportation Needs (04/04/2022)   PRAPARE - Hydrologist (Medical): No    Lack of Transportation (Non-Medical): No  Physical Activity: Not on file  Stress: Not on file  Social Connections: Not on file  Intimate Partner Violence: Not on file    FH:  Family History  Problem Relation Age of Onset   Diabetes Mother    Kidney disease Mother    Seizures Father    Hypertension Father    Stroke Father    Asthma Other     Past Medical History:  Diagnosis Date   Abnormal liver function tests    Acute kidney injury ([place]) 01/2014   Hospitalized, Volume depletion, nausea and vomiting   Anxiety    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     amLODipine (NORVASC) 10 MG tablet Take 1 tablet (10 mg total) by mouth daily. 30 tablet 0   aspirin EC 81 MG tablet Take 1 tablet (81 mg total) by mouth daily. Swallow whole. 30 tablet 0   atorvastatin (LIPITOR) 80 MG tablet Take 1 tablet (80 mg total) by mouth daily. 30 tablet 0   budesonide-formoterol (SYMBICORT) 160-4.5 MCG/ACT inhaler Inhale 2 puffs into the lungs every morning.     cetirizine (ZYRTEC) 10 MG tablet Take 10 mg by mouth daily as needed for allergies.     clopidogrel (PLAVIX) 75 MG tablet Take 1 tablet (75 mg total) by mouth daily. 30 tablet 0   ergocalciferol (VITAMIN D2) 1.25 MG (50000 UT) capsule Take 1 capsule (50,000 Units total) by mouth once a week. Wednesday 30 capsule 0    gabapentin (NEURONTIN) 100 MG capsule Take 2 capsules (200 mg total) by mouth 2 (two) times daily. 60 capsule 0   glimepiride (AMARYL) 2 MG tablet Take 2 tablets (4 mg total) by mouth 2 (two) times daily. 120 tablet 0   hydrALAZINE (APRESOLINE) 25 MG tablet Take 1 tablet (25 mg total) by mouth 3 (three) times daily. 270 tablet 3   insulin glargine (LANTUS) 100 UNIT/ML Solostar Pen Inject 5 Units into the skin at bedtime. 15 mL 0   Insulin Pen Needle (PEN NEEDLES) 32G X 4 MM MISC 1 Units by Does not apply route daily. 100 each 0   isosorbide mononitrate (IMDUR) 30 MG 24 hr tablet Take 1/2 tablet (15 mg total) by mouth daily. 15 tablet 0   Nebivolol  HCl 20 MG TABS Take 1 tablet (20 mg total) by mouth every morning. 30 tablet 0   nitroGLYCERIN (NITROSTAT) 0.4 MG SL tablet Place 1 tablet (0.4 mg total) under the tongue every 5 (five) minutes as needed for chest pain. (Patient not taking: Reported on 05/30/2022) 30 tablet 0   pantoprazole (PROTONIX) 40 MG tablet Take 1 tablet (40 mg total) by mouth daily at 12 noon. 30 tablet 0   QUEtiapine (SEROQUEL XR) 300 MG 24 hr tablet Take 1 tablet (300 mg total) by mouth at bedtime. 30 tablet 0   sertraline (ZOLOFT) 50 MG tablet Take 1 tablet (50 mg total) by mouth every evening. 30 tablet 0   No current facility-administered medications for this encounter.   There were no vitals taken for this visit.  Wt Readings from Last 3 Encounters:  06/07/22 85.3 kg (188 lb)  05/30/22 83.8 kg (184 lb 12.8 oz)  05/16/22 82.6 kg (182 lb 3.2 oz)   PHYSICAL EXAM: General:  NAD. No resp difficulty, walked into clinic HEENT: Normal Neck: Supple. No JVD. Carotids 2+ bilat; no bruits. No lymphadenopathy or thryomegaly appreciated. Cor: PMI nondisplaced. Regular rate & rhythm. No rubs, gallops or murmurs. Lungs: Clear Abdomen: Soft, nontender, nondistended. No hepatosplenomegaly. No bruits or masses. Good bowel sounds. Extremities: No cyanosis, clubbing, rash,  edema Neuro: Alert & oriented x 3, cranial nerves grossly intact. Moves all 4 extremities w/o difficulty. Affect pleasant.  ECG (personally reviewed): NSR, LVH 65 bpm, QTc 505 msec.  ASSESSMENT & PLAN: 1. HFpEF - Echo 01/2014: EF 65%, moderate LVH, RV okay - Echo 5/23: EF 60-65%, mild LVH, grade I DD, RV okay, mild LAE - Multiple admissions over the last 6 months with a/c CHF, frequently in setting of hypertensive emergency, cocaine use and medication noncompliance - She does have history of sarcoidosis of liver. Has recurrent CHF exacerbations and runs of NSVT noted on tele during recent admits. Need to rule out cardiac sarcoidosis.  - cMRI set up 06/2022. If abnormal, will need cardiac PET.  - GDMT limited by CKD. No ARNi/ARB or MRA. - NYHA II-early III. Volume OK today. - Restart hydralazine 25 mg tid for afterload reduction. - Continue Imdur 15 mg daily. - Continue torsemide 10 mg daily. - Continue nebivolol 20 mg daily. - BMET today.  2. Hypertension - Elevated today. - Restart hydralazine as above.   3. CAD - NSTEMI (4/23). LHC with occluded small subranch of PDA (treated medically) - Continue aspirin, plavix, statin +beta blocker - No chest pain.    4. CKD IV - Multiple recurrent AKIs recently. Briefly required HD in 01/2022.  - New Scr baseline ~ 2.5 - She has been referred to nephrology  - Labs today.   5. Substance abuse - Continues to use crack cocaine. - Recommended complete cessation   6. Sarcoidosis of liver - Previously followed by hepatology at [facility]. No showed subsequent follow-ups.  - Ruling out cardiac sarcoid as above. - CT chest 06/23 with small mediastinal and paratracheal nodes, mild subpleural reticular interstitial changes.  - She has been referred to Pulmonary.   7. NSVT - Wore Zio for 2 days. Asked her to mail in Gratis beta blocker.  8. Daytime fatigue/snoring - Arrange in lab sleep study  9. Limited Reading Ability/SDOH -  Finished 12 grade but didn't pass test so she did not receive diploma. She was in special needs classes. - Continue paramedicine, appreciate their assistance.  Follow up with Dr. Haroldine Laws in  2 months, as scheduled.  Quillian Quince Seher Schlagel FNP-BC  12:46 PM

## 2022-06-20 NOTE — Telephone Encounter (Signed)
Ms. Hauge called me and advised she would not be able to make her clinic visit today @ 2:00.   I asked her if she contacted the clinic to let them know she wasn't going to be able to make her appointment and she advised she had.  I scheduled a home visit in the morning @ 8:30.    [name], [place] 06/20/2022

## 2022-06-21 ENCOUNTER — Other Ambulatory Visit (HOSPITAL_COMMUNITY): Payer: Self-pay | Admitting: Emergency Medicine

## 2022-06-21 ENCOUNTER — Other Ambulatory Visit (HOSPITAL_COMMUNITY): Payer: Self-pay | Admitting: *Deleted

## 2022-06-21 MED ORDER — TORSEMIDE 20 MG PO TABS: ORAL_TABLET | ORAL | 3 refills | Status: DC

## 2022-06-21 NOTE — Progress Notes (Signed)
Paramedicine Encounter    Patient ID: Carol [name], female    DOB: [birth year], 61 y.o.   MRN: [id number]   BP (!) 200/100 (BP Location: Left Arm, Patient Position: Sitting, Cuff Size: Normal)   Pulse 100   Resp (!) 26   Wt 187 lb 6.4 oz (85 kg)   BMI 32.17 kg/m  Weight yesterday-not taken Last visit weight-188lb  ATF Carol [name] A&O x 4, skin W&D w/good color.  Pt. Denies chest pain or SOB.  Lung sounds clear and equal bilat.  No edema noted to her lower extremities.  She reports to not feeling great today and has a headache.  Carol [name] has multiple duplicate medication bottles and I took time to review all and consolidate where possible. Med box reconcilled. She has no done well in her med compliance.  She missed her appointment with me on 10/26 and also missed her clinic appointment with Dr. Sung Amabile on 11/1.   While discussing her med compliance and appointment attendance she admits that she has been using cocaine again and that's been the interference.  She advised she had been clean for "20 years" but has been under a lot of stress and started to use again.  She also says her boyfriend uses and often encourages her to participate in drug use with him.  She became emotional and says she feels guilty about what she's doing.  She says she has a sponsor and plans on reaching out to her for help with her substance abuse.  I advised her to reach out to me if I can be of further assistance with getting her in contact with any other appropriate resouces.  I assisted her in rescheduling her next clinic 11/21 @ 12:00.   Home visit complete.    [name], [place] 06/22/2022  Patient Care Team: Simona Huh, NP as PCP - General (Nurse Practitioner) Janina Mayo, MD as PCP - Cardiology (Cardiology)  Patient Active Problem List   Diagnosis Date Noted   Acute exacerbation of CHF (congestive heart failure) ([place]) 05/09/2022   High anion gap metabolic acidosis  37/90/2409   Chest pain, atypical 05/09/2022   Acute on chronic diastolic (congestive) heart failure ([place]) 03/22/2022   COPD not affecting current episode of care 03/22/2022   Class 2 obesity due to excess calories with body mass index (BMI) of 37.0 to 37.9 in adult 03/22/2022   CHF (congestive heart failure) ([place]) 03/14/2022   CKD (chronic kidney disease), stage IV ([place]) 03/10/2022   Hypertensive urgency 01/22/2022   Acute kidney injury superimposed on chronic kidney disease ([place]) 01/22/2022   Acute on chronic diastolic CHF (congestive heart failure) ([place]) 01/22/2022   Prolonged QT interval 01/22/2022   Elevated troponin 01/22/2022   Thyroid nodule 01/22/2022   Cocaine abuse ([place]) 01/02/2022   Diabetic hypoglycemia ([place]) 12/31/2021   CKD (chronic kidney disease) stage 4, GFR 15-29 ml/min ([place]) 12/31/2021   CAD in native artery 12/24/2021   Anxiety with depression 12/24/2021   Pure hypercholesterolemia    Colon cancer screening 02/18/2014   Diabetes mellitus ([place]) 01/29/2014   Essential hypertension 01/29/2014   Closed jaw fracture ([place]) 01/29/2014   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 Swallow whole., Disp: 30 tablet, Rfl: 0   atorvastatin (LIPITOR) 80 MG tablet, Take 1 tablet (80 mg total) by mouth daily., Disp: 30 tablet, Rfl: 0   budesonide-formoterol (SYMBICORT) 160-4.5 MCG/ACT inhaler, Inhale 2 puffs into the lungs every morning., Disp: , Rfl:    cetirizine (ZYRTEC) 10 MG tablet, Take 10 mg by mouth daily as needed for allergies., Disp: , Rfl:    clopidogrel (PLAVIX) 75 MG tablet, Take 1 tablet (75 mg total) by mouth daily., Disp: 30 tablet, Rfl: 0   ergocalciferol (VITAMIN  D2) 1.25 MG (50000 UT) capsule, Take 1 capsule (50,000 Units total) by mouth once a week. Wednesday, Disp: 30 capsule, Rfl: 0   gabapentin (NEURONTIN) 100 MG capsule, Take 2 capsules (200 mg total) by mouth 2 (two) times daily., Disp: 60 capsule, Rfl: 0   hydrALAZINE (APRESOLINE) 25 MG tablet, Take 1 tablet (25 mg total) by mouth 3 (three) times daily., Disp: 270 tablet, Rfl: 3   insulin glargine (LANTUS) 100 UNIT/ML Solostar Pen, Inject 5 Units into the skin at bedtime., Disp: 15 mL, Rfl: 0   Insulin Pen Needle (PEN NEEDLES) 32G X 4 MM MISC, 1 Units by Does not apply route daily., Disp: 100 each, Rfl: 0   Nebivolol HCl 20 MG TABS, Take 1 tablet (20 mg total) by mouth every morning., Disp: 30 tablet, Rfl: 0   pantoprazole (PROTONIX) 40 MG tablet, Take 1 tablet (40 mg total) by mouth daily at 12 noon., Disp: 30 tablet, Rfl: 0   torsemide (DEMADEX) 20 MG tablet, Take 1 tablet weekly on Thursdays., Disp: 180 tablet, Rfl: 3   amLODipine (NORVASC) 10 MG tablet, Take 1 tablet (10 mg total) by mouth daily., Disp: 30 tablet, Rfl: 0   glimepiride (AMARYL) 2 MG tablet, Take 2 tablets (4 mg total) by mouth 2 (two) times daily., Disp: 120 tablet, Rfl: 0   isosorbide mononitrate (IMDUR) 30 MG 24 hr tablet, Take 1/2 tablet (15 mg total) by mouth daily., Disp: 15 tablet, Rfl: 0   nitroGLYCERIN (NITROSTAT) 0.4 MG SL tablet, Place 1 tablet (0.4 mg total) under the tongue every 5 (five) minutes as needed for chest pain. (Patient not taking: Reported on 05/30/2022), Disp: 30 tablet, Rfl: 0   QUEtiapine (SEROQUEL XR) 300 MG 24 hr tablet, Take 1 tablet (300 mg total) by mouth at bedtime., Disp: 30 tablet, Rfl: 0   sertraline (ZOLOFT) 50 MG tablet, Take 1 tablet (50 mg total) by mouth every evening., Disp: 30 tablet, Rfl: 0 Allergies  Allergen Reactions   Penicillins Shortness Of Breath and Other (See Comments)    Caused yeast infection Has patient had a PCN reaction causing immediate rash, facial/tongue/throat  swelling, SOB or lightheadedness with hypotension: Yes Has patient had a PCN reaction causing severe rash involving mucus membranes or skin necrosis: No Has patient had a PCN reaction that required hospitalization pt was in the hospital at the time of the reaction Has patient had a PCN reaction occurring within the last 10 years: No If all of the above answers are "NO", then may proceed with Cephalosp   Ultram [Tramadol] Other (See Comments)    Made her tongue raw      Social History   Socioeconomic History   Marital status: Single    Spouse name: Not on file   Number of children: 3   Years of education: Not on file   Highest education level: Not on file  Occupational History  Not on file  Tobacco Use   Smoking status: Some Days    Packs/day: 0.25    Years: 3.00    Total pack years: 0.75    Types: Cigarettes   Smokeless tobacco: Never  Vaping Use   Vaping Use: Never used  Substance and Sexual Activity   Alcohol use: Yes    Alcohol/week: 0.0 standard drinks of alcohol    Comment: occasional   Drug use: No    Comment: none for 13 years   Sexual activity: Not on file  Other Topics Concern   Not on file  Social History Narrative   Not on file   Social Determinants of Health   Financial Resource Strain: Low Risk  (04/04/2022)   Overall Financial Resource Strain (CARDIA)    Difficulty of Paying Living Expenses: Not very hard  Food Insecurity: No Food Insecurity (04/04/2022)   Hunger Vital Sign    Worried About Running Out of Food in the Last Year: Never true    Ran Out of Food in the Last Year: Never true  Transportation Needs: No Transportation Needs (04/04/2022)   PRAPARE - Hydrologist (Medical): No    Lack of Transportation (Non-Medical): No  Physical Activity: Not on file  Stress: Not on file  Social Connections: Not on file  Intimate Partner Violence: Not on file    Physical Exam      Future Appointments  Date Time Provider  [place]  06/29/2022  9:00 AM MC-MR 1 [facility]  07/10/2022 12:00 PM [facility] PA/NP [facility] None  01/07/2023 11:30 AM Shamleffer, Melanie Crazier, MD [facility] None       [name], [place] [facility] Paramedic  06/22/22

## 2022-06-28 ENCOUNTER — Telehealth (HOSPITAL_COMMUNITY): Payer: Self-pay | Admitting: Emergency Medicine

## 2022-06-28 ENCOUNTER — Other Ambulatory Visit (HOSPITAL_COMMUNITY): Payer: Self-pay

## 2022-06-28 ENCOUNTER — Other Ambulatory Visit (HOSPITAL_COMMUNITY): Payer: Self-pay | Admitting: Emergency Medicine

## 2022-06-28 MED ORDER — HYDRALAZINE HCL 50 MG PO TABS: 50.0000 mg | ORAL_TABLET | Freq: Three times a day (TID) | ORAL | 3 refills | Status: DC

## 2022-06-28 NOTE — Telephone Encounter (Signed)
Called and spoke with Carol [name] to remind her of her appointment for the MRI of her Heart in the morning and that she needs to be there at 8:30 and her appoinment is at 9:00.  She advised that she would be able to make this appointment.    [name], [place] 06/28/2022

## 2022-06-28 NOTE — Telephone Encounter (Signed)
Attempted to call patient regarding upcoming cardiac MR appointment. Left message on voicemail with name and callback number Marchia Bond CT Navigator Cardiac Imaging [facility] [phone] Office [phone] Cell

## 2022-06-28 NOTE — Progress Notes (Signed)
Paramedicine Encounter    Patient ID: Carol [name], female    DOB: 11-19-1960, 61 y.o.   MRN: [id number]   BP (!) 170/90 (BP Location: Left Arm, Patient Position: Sitting, Cuff Size: Normal)   Pulse 84   Resp 16   Wt 189 lb 6.4 oz (85.9 kg)   SpO2 100%   BMI 32.51 kg/m  Weight yesterday-not taken Last visit weight-187.6lb  ATF Ms. Kronberg A&O x 4, skin W&D w/ good color.  Pt. Denies chest pain or SOB.  Lung sounds clear and equal bilat.  No edema noted to her extremities but she does indicate that her legs are tender to palpation.   Med box reconciled x 1 week.  Requested refills for Glimepiride, Quetiapine and Gabapentin from [facility]. Meds should be delivered tomorrow.   Reached out to clinic for Ms. Schoenfelder's blood pressure running high.  Spoke with Philicia in triage and was advised med change per Amy Clegg to increase Hydralazine to 50mg . TID.  I reconciled her med box to reflect same. Home visit complete.    [name], [place] 06/28/2022   Patient Care Team: Simona Huh, NP as PCP - General (Nurse Practitioner) Janina Mayo, MD as PCP - Cardiology (Cardiology)  Patient Active Problem List   Diagnosis Date Noted   Acute exacerbation of CHF (congestive heart failure) ([place]) 05/09/2022   High anion gap metabolic acidosis 74/14/2395   Chest pain, atypical 05/09/2022   Acute on chronic diastolic (congestive) heart failure ([place]) 03/22/2022   COPD not affecting current episode of care 03/22/2022   Class 2 obesity due to excess calories with body mass index (BMI) of 37.0 to 37.9 in adult 03/22/2022   CHF (congestive heart failure) ([place]) 03/14/2022   CKD (chronic kidney disease), stage IV ([place]) 03/10/2022   Hypertensive urgency 01/22/2022   Acute kidney injury superimposed on chronic kidney disease ([place]) 01/22/2022   Acute on chronic diastolic CHF (congestive heart failure) ([place]) 01/22/2022   Prolonged QT interval 01/22/2022   Elevated  troponin 01/22/2022   Thyroid nodule 01/22/2022   Cocaine abuse ([place]) 01/02/2022   Diabetic hypoglycemia ([place]) 12/31/2021   CKD (chronic kidney disease) stage 4, GFR 15-29 ml/min ([place]) 12/31/2021   CAD in native artery 12/24/2021   Anxiety with depression 12/24/2021   Pure hypercholesterolemia    Colon cancer screening 02/18/2014   Diabetes mellitus ([place]) 01/29/2014   Essential hypertension 01/29/2014   Closed jaw fracture ([place]) 01/29/2014   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 Swallow whole., Disp: 30 tablet, Rfl: 0   atorvastatin (LIPITOR) 80 MG tablet, Take 1 tablet (80 mg total) by mouth daily., Disp: 30 tablet, Rfl: 0   budesonide-formoterol (SYMBICORT) 160-4.5 MCG/ACT inhaler, Inhale 2 puffs into the lungs every morning., Disp: , Rfl:    cetirizine (ZYRTEC) 10 MG tablet, Take 10 mg by mouth daily as needed for allergies., Disp: , Rfl:    clopidogrel (PLAVIX) 75 MG tablet, Take 1 tablet (75 mg total) by mouth daily., Disp: 30 tablet, Rfl: 0   ergocalciferol (VITAMIN D2) 1.25 MG (50000 UT) capsule, Take 1 capsule (50,000 Units total) by mouth once a week. Wednesday, Disp: 30 capsule,  Rfl: 0   hydrALAZINE (APRESOLINE) 25 MG tablet, Take 1 tablet (25 mg total) by mouth 3 (three) times daily., Disp: 270 tablet, Rfl: 3   insulin glargine (LANTUS) 100 UNIT/ML Solostar Pen, Inject 5 Units into the skin at bedtime., Disp: 15 mL, Rfl: 0   Insulin Pen Needle (PEN NEEDLES) 32G X 4 MM MISC, 1 Units by Does not apply route daily., Disp: 100 each, Rfl: 0   Nebivolol HCl 20 MG TABS, Take 1 tablet (20 mg total) by mouth every morning., Disp: 30 tablet, Rfl: 0   pantoprazole (PROTONIX) 40 MG tablet,  Take 1 tablet (40 mg total) by mouth daily at 12 noon., Disp: 30 tablet, Rfl: 0   torsemide (DEMADEX) 20 MG tablet, Take 1 tablet weekly on Thursdays., Disp: 180 tablet, Rfl: 3   amLODipine (NORVASC) 10 MG tablet, Take 1 tablet (10 mg total) by mouth daily., Disp: 30 tablet, Rfl: 0   gabapentin (NEURONTIN) 100 MG capsule, Take 2 capsules (200 mg total) by mouth 2 (two) times daily., Disp: 60 capsule, Rfl: 0   glimepiride (AMARYL) 2 MG tablet, Take 2 tablets (4 mg total) by mouth 2 (two) times daily., Disp: 120 tablet, Rfl: 0   isosorbide mononitrate (IMDUR) 30 MG 24 hr tablet, Take 1/2 tablet (15 mg total) by mouth daily., Disp: 15 tablet, Rfl: 0   nitroGLYCERIN (NITROSTAT) 0.4 MG SL tablet, Place 1 tablet (0.4 mg total) under the tongue every 5 (five) minutes as needed for chest pain. (Patient not taking: Reported on 05/30/2022), Disp: 30 tablet, Rfl: 0   QUEtiapine (SEROQUEL XR) 300 MG 24 hr tablet, Take 1 tablet (300 mg total) by mouth at bedtime., Disp: 30 tablet, Rfl: 0   sertraline (ZOLOFT) 50 MG tablet, Take 1 tablet (50 mg total) by mouth every evening., Disp: 30 tablet, Rfl: 0 Allergies  Allergen Reactions   Penicillins Shortness Of Breath and Other (See Comments)    Caused yeast infection Has patient had a PCN reaction causing immediate rash, facial/tongue/throat swelling, SOB or lightheadedness with hypotension: Yes Has patient had a PCN reaction causing severe rash involving mucus membranes or skin necrosis: No Has patient had a PCN reaction that required hospitalization pt was in the hospital at the time of the reaction Has patient had a PCN reaction occurring within the last 10 years: No If all of the above answers are "NO", then may proceed with Cephalosp   Ultram [Tramadol] Other (See Comments)    Made her tongue raw      Social History   Socioeconomic History   Marital status: Single    Spouse name: Not on file   Number of children: 3   Years of education: Not on file    Highest education level: Not on file  Occupational History   Not on file  Tobacco Use   Smoking status: Some Days    Packs/day: 0.25    Years: 3.00    Total pack years: 0.75    Types: Cigarettes   Smokeless tobacco: Never  Vaping Use   Vaping Use: Never used  Substance and Sexual Activity   Alcohol use: Yes    Alcohol/week: 0.0 standard drinks of alcohol    Comment: occasional   Drug use: No    Comment: none for 13 years   Sexual activity: Not on file  Other Topics Concern   Not on file  Social History Narrative   Not on file   Social Determinants of Health   Financial Resource  Strain: Low Risk  (04/04/2022)   Overall Financial Resource Strain (CARDIA)    Difficulty of Paying Living Expenses: Not very hard  Food Insecurity: No Food Insecurity (04/04/2022)   Hunger Vital Sign    Worried About Running Out of Food in the Last Year: Never true    Ran Out of Food in the Last Year: Never true  Transportation Needs: No Transportation Needs (04/04/2022)   PRAPARE - Hydrologist (Medical): No    Lack of Transportation (Non-Medical): No  Physical Activity: Not on file  Stress: Not on file  Social Connections: Not on file  Intimate Partner Violence: Not on file    Physical Exam      Future Appointments  Date Time Provider [place]  06/29/2022  9:00 AM MC-MR 1 MC[facility]MRI [facility]  07/10/2022 12:00 PM MC-[facility] PA/NP MC-[facility] None  01/07/2023 11:30 AM Shamleffer, Melanie Crazier, MD [facility] None       [name], [place] [facility] Paramedic  06/28/22

## 2022-06-28 NOTE — Telephone Encounter (Signed)
Carol [name] paramedic called to report that today the patients blood pressure is 170/90 and her weight is 189. On 06/21/22 her blood pressure was 200/100 and her weight was 187. Per Darrick Grinder, NP increase Hydralazine to 50mg  tid. Carol [name] advised and verbalized understanding. New Rx sent into patients pharmacy.

## 2022-06-29 ENCOUNTER — Ambulatory Visit (HOSPITAL_COMMUNITY)
Admission: RE | Admit: 2022-06-29 | Discharge: 2022-06-29 | Disposition: A | Discharge status: Home / Self Care | Payer: Medicare Other | Source: Ambulatory Visit | Attending: Physician Assistant | Admitting: Physician Assistant

## 2022-06-29 ENCOUNTER — Other Ambulatory Visit (HOSPITAL_COMMUNITY): Payer: Self-pay | Admitting: Physician Assistant

## 2022-06-29 DIAGNOSIS — I5033 Acute on chronic diastolic (congestive) heart failure: Secondary | ICD-10-CM

## 2022-06-29 MED ORDER — GADOBUTROL 1 MMOL/ML IV SOLN
8.0000 mL | Freq: Once | INTRAVENOUS | Status: AC | PRN
  Administered 2022-06-29: 8 mL via INTRAVENOUS

## 2022-07-02 ENCOUNTER — Telehealth (HOSPITAL_COMMUNITY): Payer: Self-pay

## 2022-07-02 NOTE — Telephone Encounter (Signed)
Error

## 2022-07-02 NOTE — Telephone Encounter (Signed)
Patient aware and agreeable. 

## 2022-07-04 ENCOUNTER — Other Ambulatory Visit (HOSPITAL_COMMUNITY): Payer: Self-pay | Admitting: *Deleted

## 2022-07-04 ENCOUNTER — Other Ambulatory Visit (HOSPITAL_COMMUNITY): Payer: Self-pay | Admitting: Emergency Medicine

## 2022-07-04 MED ORDER — POTASSIUM CHLORIDE CRYS ER 10 MEQ PO TBCR: 10.0000 meq | EXTENDED_RELEASE_TABLET | Freq: Every day | ORAL | 3 refills | Status: DC

## 2022-07-04 MED ORDER — TORSEMIDE 20 MG PO TABS: 20.0000 mg | ORAL_TABLET | Freq: Every day | ORAL | 3 refills | Status: DC

## 2022-07-04 MED ORDER — HYDRALAZINE HCL 50 MG PO TABS: 50.0000 mg | ORAL_TABLET | Freq: Three times a day (TID) | ORAL | 3 refills | Status: DC

## 2022-07-04 NOTE — Progress Notes (Signed)
Paramedicine Encounter    Patient ID: Carol [name], female    DOB: 14-Mar-1961, 61 y.o.   MRN: [id number]   There were no vitals taken for this visit. Weight yesterday-not taken Last visit weight-189.6lb  Patient Care Team: Simona Huh, NP as PCP - General (Nurse Practitioner) Janina Mayo, MD as PCP - Cardiology (Cardiology)  Patient Active Problem List   Diagnosis Date Noted  . Acute exacerbation of CHF (congestive heart failure) ([place]) 05/09/2022  . High anion gap metabolic acidosis 75/05/2584  . Chest pain, atypical 05/09/2022  . Acute on chronic diastolic (congestive) heart failure ([place]) 03/22/2022  . COPD not affecting current episode of care 03/22/2022  . Class 2 obesity due to excess calories with body mass index (BMI) of 37.0 to 37.9 in adult 03/22/2022  . CHF (congestive heart failure) ([place]) 03/14/2022  . CKD (chronic kidney disease), stage IV ([place]) 03/10/2022  . Hypertensive urgency 01/22/2022  . Acute kidney injury superimposed on chronic kidney disease ([place]) 01/22/2022  . Acute on chronic diastolic CHF (congestive heart failure) ([place]) 01/22/2022  . Prolonged QT interval 01/22/2022  . Elevated troponin 01/22/2022  . Thyroid nodule 01/22/2022  . Cocaine abuse ([place]) 01/02/2022  . Diabetic hypoglycemia ([place]) 12/31/2021  . CKD (chronic kidney disease) stage 4, GFR 15-29 ml/min (HCC) 12/31/2021  . CAD in native artery 12/24/2021  . Anxiety with depression 12/24/2021  . Pure hypercholesterolemia   . Colon cancer screening 02/18/2014  . Diabetes mellitus ([place]) 01/29/2014  . Essential hypertension 01/29/2014  . Closed jaw fracture ([place]) 01/29/2014  . 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    Current Outpatient Medications:  .  albuterol (PROVENTIL HFA;VENTOLIN HFA) 108 (90 Base) MCG/ACT inhaler, Inhale 2 puffs into the lungs every 4 (four) hours as needed for wheezing or shortness of breath., Disp: , Rfl:  .  amLODipine (NORVASC) 10 MG tablet,  Take 1 tablet (10 mg total) by mouth daily., Disp: 30 tablet, Rfl: 0 .  aspirin EC 81 MG tablet, Take 1 tablet (81 mg total) by mouth daily. Swallow whole., Disp: 30 tablet, Rfl: 0 .  atorvastatin (LIPITOR) 80 MG tablet, Take 1 tablet (80 mg total) by mouth daily., Disp: 30 tablet, Rfl: 0 .  budesonide-formoterol (SYMBICORT) 160-4.5 MCG/ACT inhaler, Inhale 2 puffs into the lungs every morning., Disp: , Rfl:  .  cetirizine (ZYRTEC) 10 MG tablet, Take 10 mg by mouth daily as needed for allergies., Disp: , Rfl:  .  clopidogrel (PLAVIX) 75 MG tablet, Take 1 tablet (75 mg total) by mouth daily., Disp: 30 tablet, Rfl: 0 .  ergocalciferol (VITAMIN D2) 1.25 MG (50000 UT) capsule, Take 1 capsule (50,000 Units total) by mouth once a week. Wednesday, Disp: 30 capsule, Rfl: 0 .  gabapentin (NEURONTIN) 100 MG capsule, Take 2 capsules (200 mg total) by mouth 2 (two) times daily., Disp: 60 capsule, Rfl: 0 .  glimepiride (AMARYL) 2 MG tablet, Take 2 tablets (4 mg total) by mouth 2 (two) times daily., Disp: 120 tablet, Rfl: 0 .  hydrALAZINE (APRESOLINE) 50 MG tablet, Take 1 tablet (50 mg total) by mouth 3 (three) times daily., Disp: 270 tablet, Rfl: 3 .  insulin glargine (LANTUS) 100 UNIT/ML Solostar Pen, Inject 5 Units into the skin at bedtime., Disp: 15 mL, Rfl: 0 .  Insulin Pen Needle (PEN NEEDLES) 32G X 4 MM MISC, 1 Units by Does not apply route daily., Disp: 100 each, Rfl: 0 .  isosorbide mononitrate (IMDUR) 30 MG 24 hr tablet, Take 1/2  tablet (15 mg total) by mouth daily., Disp: 15 tablet, Rfl: 0 .  Nebivolol HCl 20 MG TABS, Take 1 tablet (20 mg total) by mouth every morning., Disp: 30 tablet, Rfl: 0 .  nitroGLYCERIN (NITROSTAT) 0.4 MG SL tablet, Place 1 tablet (0.4 mg total) under the tongue every 5 (five) minutes as needed for chest pain. (Patient not taking: Reported on 05/30/2022), Disp: 30 tablet, Rfl: 0 .  pantoprazole (PROTONIX) 40 MG tablet, Take 1 tablet (40 mg total) by mouth daily at 12 noon., Disp:  30 tablet, Rfl: 0 .  QUEtiapine (SEROQUEL XR) 300 MG 24 hr tablet, Take 1 tablet (300 mg total) by mouth at bedtime., Disp: 30 tablet, Rfl: 0 .  sertraline (ZOLOFT) 50 MG tablet, Take 1 tablet (50 mg total) by mouth every evening., Disp: 30 tablet, Rfl: 0 .  torsemide (DEMADEX) 20 MG tablet, Take 1 tablet weekly on Thursdays., Disp: 180 tablet, Rfl: 3 Allergies  Allergen Reactions  . Penicillins Shortness Of Breath and Other (See Comments)    Caused yeast infection Has patient had a PCN reaction causing immediate rash, facial/tongue/throat swelling, SOB or lightheadedness with hypotension: Yes Has patient had a PCN reaction causing severe rash involving mucus membranes or skin necrosis: No Has patient had a PCN reaction that required hospitalization pt was in the hospital at the time of the reaction Has patient had a PCN reaction occurring within the last 10 years: No If all of the above answers are "NO", then may proceed with Cephalosp  . Ultram [Tramadol] Other (See Comments)    Made her tongue raw      Social History   Socioeconomic History  . Marital status: Single    Spouse name: Not on file  . Number of children: 3  . Years of education: Not on file  . Highest education level: Not on file  Occupational History  . Not on file  Tobacco Use  . Smoking status: Some Days    Packs/day: 0.25    Years: 3.00    Total pack years: 0.75    Types: Cigarettes  . Smokeless tobacco: Never  Vaping Use  . Vaping Use: Never used  Substance and Sexual Activity  . Alcohol use: Yes    Alcohol/week: 0.0 standard drinks of alcohol    Comment: occasional  . Drug use: No    Comment: none for 13 years  . Sexual activity: Not on file  Other Topics Concern  . Not on file  Social History Narrative  . Not on file   Social Determinants of Health   Financial Resource Strain: Low Risk  (04/04/2022)   Overall Financial Resource Strain (CARDIA)   . Difficulty of Paying Living Expenses: Not  very hard  Food Insecurity: No Food Insecurity (04/04/2022)   Hunger Vital Sign   . Worried About Charity fundraiser in the Last Year: Never true   . Ran Out of Food in the Last Year: Never true  Transportation Needs: No Transportation Needs (04/04/2022)   PRAPARE - Transportation   . Lack of Transportation (Medical): No   . Lack of Transportation (Non-Medical): No  Physical Activity: Not on file  Stress: Not on file  Social Connections: Not on file  Intimate Partner Violence: Not on file    Physical Exam      Future Appointments  Date Time Provider [place]  07/10/2022 12:00 PM [facility] PA/NP [facility] None  01/07/2023 11:30 AM Shamleffer, Melanie Crazier, MD [facility] None  [name], [place] [facility] Paramedic  07/04/22

## 2022-07-10 ENCOUNTER — Encounter (HOSPITAL_COMMUNITY): Payer: Medicare Other

## 2022-07-10 ENCOUNTER — Telehealth (HOSPITAL_COMMUNITY): Payer: Self-pay

## 2022-07-10 ENCOUNTER — Other Ambulatory Visit (HOSPITAL_COMMUNITY): Payer: Self-pay | Admitting: Emergency Medicine

## 2022-07-10 NOTE — Progress Notes (Signed)
Patient was no show for clinic visit @ 12:00 today.  I reached out to call her to confirm she was coming and then reached out again after she did not show.  LVM as she didn't answer.     [name], [place] 07/10/2022

## 2022-07-10 NOTE — Telephone Encounter (Signed)
Called to confirm/remind patient of their appointment at the [facility] on 07/11/22.   Patient reminded to bring all medications and/or complete list.  Confirmed patient has transportation. Gave directions, instructed to utilize [place] parking.  Confirmed appointment prior to ending call.

## 2022-07-10 NOTE — Progress Notes (Incomplete)
Patient did not show for appt. Note left for templating purposes only.      Advanced Heart Failure Clinic Note  PCP: Coralie Carpen  NP  Primary Cardiologist: Dr Phineas Inches HF Cardiologist: Dr. Haroldine Laws  HPI: 61 y.o. female with history of HTN, HLD, DM, crack/cocaine use, ETOH, tobacco abuse, CKD III, hx of sarcoidosis involving liver (previously followed by Hepatology at [place], no-showed follow-ups). Limited reading ability.    Admitted in April 2023 with NSTEMI and uncontrolled HTN. Echo: EF 60-65%, mild LVH, grade I DD, RV okay. LHC demonstrated occluded small subranch off right PDA (treated medically), mild disease in LAD and RCA. Developed AKI post cath with Scr peaking at 3.2.    Readmitted in May 2023 with episodes of hypoglycemia. Received IV fluids. Developed volume overload and was diuresed. BP meds adjusted d/t hypertensive urgency.   Readmitted 06/23 with CP and hypertensive emergency. Minimally elevated HS troponin with flat trend. CTA chest negative for aortic dissection. CP atypical for angina. UDS + cocaine. Had been using crack cocaine since April d/t stress and using ibuprofen daily. She was volume overloaded and diuresed.BP rapidly corrected and became hypotensive. Had recurrent AKI and required iHD. Renal function improved enough to remove HD cath prior to discharge.     Admitted again 07/23 with decreased responsiveness and hypoglycemia. Had not been taking medications at home. She was volume overloaded and diuresised with IV lasix. Transitioned to torsemide 20 mg daily. Scr upper 2s - 3s during admission.   Unfortunately admitted again 08/03-08/08/23 with a/c CHF and recent cocaine use.  She was seen in HF [facility] clinic 04/04/22. Volume overloaded. Torsemide was increased 40 mg x4 days then 20 torsemide. Hydralazine was increased to 25 mg three times a day. Referred to HF Clinic and HF Paramedicine.  Follow up 9/23, NYHA III symptoms & orthostatic. Instructed to hold  torsemide x 2 days then resume at lower dose 10 mg daily.  Seen in ED 05/09/22 with CP after using cocaine. Also with a/c CHF, concern for medication noncompliance. Given IV lasix with improvement in symptoms. Hydralazine stopped due to low BP.   Today she returns for HF follow up. Overall feeling fine. She feels palpitations and has positional dizziness. Denies abnormal bleeding, CP, edema, or PND/Orthopnea. Appetite ok. No fever or chills. Weight at home 181 pounds. Taking all medications. Last used crack 1 month ago, smokes 1 cig/day. Lives alone. Limited reading ability. Followed by paramedicine. Fiance says she snores and she has daytime sleepiness.  Cardiac Studies - Echo (5/23): EF 60-65%, mild LVH, grade I DD, RV okay.   - LHC (5/23): demonstrated occluded small subranch off right PDA (treated medically), mild disease in LAD and RCA.  ROS: All systems negative except as listed in HPI, PMH and Problem List.  SH:  Social History   Socioeconomic History   Marital status: Single    Spouse name: Not on file   Number of children: 3   Years of education: Not on file   Highest education level: Not on file  Occupational History   Not on file  Tobacco Use   Smoking status: Some Days    Packs/day: 0.25    Years: 3.00    Total pack years: 0.75    Types: Cigarettes   Smokeless tobacco: Never  Vaping Use   Vaping Use: Never used  Substance and Sexual Activity   Alcohol use: Yes    Alcohol/week: 0.0 standard drinks of alcohol    Comment:  occasional   Drug use: No    Comment: none for 13 years   Sexual activity: Not on file  Other Topics Concern   Not on file  Social History Narrative   Not on file   Social Determinants of Health   Financial Resource Strain: Low Risk  (04/04/2022)   Overall Financial Resource Strain (CARDIA)    Difficulty of Paying Living Expenses: Not very hard  Food Insecurity: No Food Insecurity (04/04/2022)   Hunger Vital Sign    Worried About Running Out  of Food in the Last Year: Never true    Ran Out of Food in the Last Year: Never true  Transportation Needs: No Transportation Needs (04/04/2022)   PRAPARE - Hydrologist (Medical): No    Lack of Transportation (Non-Medical): No  Physical Activity: Not on file  Stress: Not on file  Social Connections: Not on file  Intimate Partner Violence: Not on file    FH:  Family History  Problem Relation Age of Onset   Diabetes Mother    Kidney disease Mother    Seizures Father    Hypertension Father    Stroke Father    Asthma Other     Past Medical History:  Diagnosis Date   Abnormal liver function tests    Acute kidney injury ([place]) 01/2014   Hospitalized, Volume depletion, nausea and vomiting   Anxiety    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     amLODipine (NORVASC) 10 MG tablet Take 1 tablet (10 mg total) by mouth daily. 30 tablet 0   aspirin EC 81 MG tablet Take 1 tablet (81 mg total) by mouth daily. Swallow whole. 30 tablet 0   atorvastatin (LIPITOR) 80 MG tablet Take 1 tablet (80 mg total) by mouth daily. 30 tablet 0   budesonide-formoterol (SYMBICORT) 160-4.5 MCG/ACT inhaler Inhale 2 puffs into the lungs every morning.     cetirizine (ZYRTEC) 10 MG tablet Take 10 mg by mouth daily as needed for allergies.     clopidogrel (PLAVIX) 75 MG tablet Take 1 tablet (75 mg total) by mouth daily. 30 tablet 0   ergocalciferol (VITAMIN D2) 1.25 MG (50000 UT) capsule Take 1 capsule  (50,000 Units total) by mouth once a week. Wednesday 30 capsule 0   gabapentin (NEURONTIN) 100 MG capsule Take 2 capsules (200 mg total) by mouth 2 (two) times daily. 60 capsule 0   glimepiride (AMARYL) 2 MG tablet Take 2 tablets (4 mg total) by mouth 2 (two) times daily. 120 tablet 0   hydrALAZINE (APRESOLINE) 50 MG tablet Take 1 tablet (50 mg total) by mouth 3 (three) times daily. 270 tablet 3   insulin glargine (LANTUS) 100 UNIT/ML Solostar Pen Inject 5 Units into the skin at bedtime. 15 mL 0   Insulin Pen Needle (PEN NEEDLES) 32G X 4 MM MISC 1 Units by Does not apply route daily. 100 each 0   isosorbide mononitrate (IMDUR) 30 MG  24 hr tablet Take 1/2 tablet (15 mg total) by mouth daily. 15 tablet 0   Nebivolol HCl 20 MG TABS Take 1 tablet (20 mg total) by mouth every morning. 30 tablet 0   nitroGLYCERIN (NITROSTAT) 0.4 MG SL tablet Place 1 tablet (0.4 mg total) under the tongue every 5 (five) minutes as needed for chest pain. (Patient not taking: Reported on 05/30/2022) 30 tablet 0   pantoprazole (PROTONIX) 40 MG tablet Take 1 tablet (40 mg total) by mouth daily at 12 noon. 30 tablet 0   potassium chloride SA (KLOR-CON M) 10 MEQ tablet Take 1 tablet (10 mEq total) by mouth daily. 30 tablet 3   QUEtiapine (SEROQUEL XR) 300 MG 24 hr tablet Take 1 tablet (300 mg total) by mouth at bedtime. 30 tablet 0   sertraline (ZOLOFT) 50 MG tablet Take 1 tablet (50 mg total) by mouth every evening. 30 tablet 0   torsemide (DEMADEX) 20 MG tablet Take 1 tablet (20 mg total) by mouth daily. 90 tablet 3   No current facility-administered medications for this visit.   There were no vitals taken for this visit.  Wt Readings from Last 3 Encounters:  07/04/22 88.5 kg (195 lb)  06/28/22 85.9 kg (189 lb 6.4 oz)  06/21/22 85 kg (187 lb 6.4 oz)   PHYSICAL EXAM: General:  NAD. No resp difficulty, walked into clinic HEENT: Normal Neck: Supple. No JVD. Carotids 2+ bilat; no bruits. No lymphadenopathy or thryomegaly  appreciated. Cor: PMI nondisplaced. Regular rate & rhythm. No rubs, gallops or murmurs. Lungs: Clear Abdomen: Soft, nontender, nondistended. No hepatosplenomegaly. No bruits or masses. Good bowel sounds. Extremities: No cyanosis, clubbing, rash, edema Neuro: Alert & oriented x 3, cranial nerves grossly intact. Moves all 4 extremities w/o difficulty. Affect pleasant.  ECG (personally reviewed): NSR, LVH 65 bpm, QTc 505 msec.  ASSESSMENT & PLAN: 1. HFpEF - Echo 01/2014: EF 65%, moderate LVH, RV okay - Echo 5/23: EF 60-65%, mild LVH, grade I DD, RV okay, mild LAE - Multiple admissions over the last 6 months with a/c CHF, frequently in setting of hypertensive emergency, cocaine use and medication noncompliance - She does have history of sarcoidosis of liver. Has recurrent CHF exacerbations and runs of NSVT noted on tele during recent admits. Need to rule out cardiac sarcoidosis.  - cMRI set up 06/2022. If abnormal, will need cardiac PET.  - GDMT limited by CKD. No ARNi/ARB or MRA. - NYHA II-early III. Volume OK today. - Restart hydralazine 25 mg tid for afterload reduction. - Continue Imdur 15 mg daily. - Continue torsemide 10 mg daily. - Continue nebivolol 20 mg daily. - BMET today.  2. Hypertension - Elevated today. - Restart hydralazine as above.   3. CAD - NSTEMI (4/23). LHC with occluded small subranch of PDA (treated medically) - Continue aspirin, plavix, statin +beta blocker - No chest pain.    4. CKD IV - Multiple recurrent AKIs recently. Briefly required HD in 01/2022.  - New Scr baseline ~ 2.5 - She has been referred to nephrology  - Labs today.   5. Substance abuse - Continues to use crack cocaine. - Recommended complete cessation   6. Sarcoidosis of liver - Previously followed by hepatology at [facility]. No showed subsequent follow-ups.  - Ruling out cardiac sarcoid as above. - CT chest 06/23 with small mediastinal and paratracheal nodes, mild subpleural reticular  interstitial changes.  - She has been referred to Pulmonary.   7. NSVT - Wore Zio for 2  days. Asked her to mail in [place] beta blocker.  8. Daytime fatigue/snoring - Arrange in lab sleep study  9. Limited Reading Ability/SDOH - Finished 12 grade but didn't pass test so she did not receive diploma. She was in special needs classes. - Continue paramedicine, appreciate their assistance.  Follow up with Dr. Haroldine Laws in 2 months, as scheduled.  Maricela Bo Idell Hissong FNP-BC  8:04 AM

## 2022-07-17 ENCOUNTER — Telehealth (HOSPITAL_COMMUNITY): Payer: Self-pay | Admitting: Emergency Medicine

## 2022-07-17 NOTE — Telephone Encounter (Signed)
Called and scheduled home visit for Thursday @ 1:00.  She was a no show for her clinic visit 07/11/23.    [name], [place] 07/17/2022

## 2022-07-19 ENCOUNTER — Other Ambulatory Visit (HOSPITAL_COMMUNITY): Payer: Self-pay | Admitting: Emergency Medicine

## 2022-07-19 NOTE — Progress Notes (Unsigned)
Paramedicine Encounter    Patient ID: Carol [name], female    DOB: 1961/06/10, 61 y.o.   MRN: [id number]   BP (!) 160/100 (BP Location: Left Arm, Patient Position: Sitting, Cuff Size: Normal)   Pulse 84   Resp 16   Wt 187 lb 9.6 oz (85.1 kg)   BMI 32.20 kg/m  Weight yesterday-no taken Last visit weight-195lb  ATF Ms. Mukherjee A&O x 4, skin W&D w/ good color.  Pt. Reports to be feeling well.  No chest pain or SOB.. Lung sounds clear and equal bilat.  No edema noted to extremities.  Pt was a no show to her last clinic appointment  and med reconcilation was not done at that time.  Today meds were reconciled.  I stressed the importance of no missing her visits and to make sure she is compliant with her meds.   Rescheuled appointment @ clinic for 08/06/22 @ 12:00.  Home visit complete.    [name], [place] 07/21/2022     Patient Care Team: Simona Huh, NP as PCP - General (Nurse Practitioner) Janina Mayo, MD as PCP - Cardiology (Cardiology)  Patient Active Problem List   Diagnosis Date Noted   Acute exacerbation of CHF (congestive heart failure) ([place]) 05/09/2022   High anion gap metabolic acidosis 91/63/8466   Chest pain, atypical 05/09/2022   Acute on chronic diastolic (congestive) heart failure ([place]) 03/22/2022   COPD not affecting current episode of care 03/22/2022   Class 2 obesity due to excess calories with body mass index (BMI) of 37.0 to 37.9 in adult 03/22/2022   CHF (congestive heart failure) ([place]) 03/14/2022   CKD (chronic kidney disease), stage IV ([place]) 03/10/2022   Hypertensive urgency 01/22/2022   Acute kidney injury superimposed on chronic kidney disease ([place]) 01/22/2022   Acute on chronic diastolic CHF (congestive heart failure) ([place]) 01/22/2022   Prolonged QT interval 01/22/2022   Elevated troponin 01/22/2022   Thyroid nodule 01/22/2022   Cocaine abuse ([place]) 01/02/2022   Diabetic hypoglycemia ([place]) 12/31/2021   CKD  (chronic kidney disease) stage 4, GFR 15-29 ml/min ([place]) 12/31/2021   CAD in native artery 12/24/2021   Anxiety with depression 12/24/2021   Pure hypercholesterolemia    Colon cancer screening 02/18/2014   Diabetes mellitus ([place]) 01/29/2014   Essential hypertension 01/29/2014   Closed jaw fracture ([place]) 01/29/2014   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 Swallow whole., Disp: 30 tablet, Rfl: 0   atorvastatin (LIPITOR) 80 MG tablet, Take 1 tablet (80 mg total) by mouth daily., Disp: 30 tablet, Rfl: 0   budesonide-formoterol (SYMBICORT) 160-4.5 MCG/ACT inhaler, Inhale 2 puffs into the lungs every morning., Disp: , Rfl:    cetirizine (ZYRTEC) 10 MG tablet, Take 10 mg by mouth daily as needed for allergies., Disp: , Rfl:    clopidogrel (PLAVIX) 75 MG tablet, Take 1 tablet (75 mg total) by mouth daily., Disp: 30 tablet, Rfl: 0   ergocalciferol (VITAMIN D2) 1.25 MG (50000 UT) capsule, Take 1 capsule (50,000 Units total) by mouth once a week. Wednesday, Disp: 30 capsule, Rfl: 0   gabapentin (NEURONTIN) 100 MG capsule, Take 2 capsules (200 mg total) by mouth 2 (  two) times daily., Disp: 60 capsule, Rfl: 0   hydrALAZINE (APRESOLINE) 50 MG tablet, Take 1 tablet (50 mg total) by mouth 3 (three) times daily., Disp: 270 tablet, Rfl: 3   insulin glargine (LANTUS) 100 UNIT/ML Solostar Pen, Inject 5 Units into the skin at bedtime., Disp: 15 mL, Rfl: 0   Insulin Pen Needle (PEN NEEDLES) 32G X 4 MM MISC, 1 Units by Does not apply route daily., Disp: 100 each, Rfl: 0   Nebivolol HCl 20 MG TABS, Take 1 tablet (20 mg total) by mouth every morning., Disp: 30 tablet, Rfl: 0   pantoprazole (PROTONIX) 40 MG tablet,  Take 1 tablet (40 mg total) by mouth daily at 12 noon., Disp: 30 tablet, Rfl: 0   potassium chloride SA (KLOR-CON M) 10 MEQ tablet, Take 1 tablet (10 mEq total) by mouth daily., Disp: 30 tablet, Rfl: 3   torsemide (DEMADEX) 20 MG tablet, Take 1 tablet (20 mg total) by mouth daily., Disp: 90 tablet, Rfl: 3   amLODipine (NORVASC) 10 MG tablet, Take 1 tablet (10 mg total) by mouth daily., Disp: 30 tablet, Rfl: 0   glimepiride (AMARYL) 2 MG tablet, Take 2 tablets (4 mg total) by mouth 2 (two) times daily., Disp: 120 tablet, Rfl: 0   isosorbide mononitrate (IMDUR) 30 MG 24 hr tablet, Take 1/2 tablet (15 mg total) by mouth daily., Disp: 15 tablet, Rfl: 0   nitroGLYCERIN (NITROSTAT) 0.4 MG SL tablet, Place 1 tablet (0.4 mg total) under the tongue every 5 (five) minutes as needed for chest pain. (Patient not taking: Reported on 05/30/2022), Disp: 30 tablet, Rfl: 0   QUEtiapine (SEROQUEL XR) 300 MG 24 hr tablet, Take 1 tablet (300 mg total) by mouth at bedtime., Disp: 30 tablet, Rfl: 0   sertraline (ZOLOFT) 50 MG tablet, Take 1 tablet (50 mg total) by mouth every evening., Disp: 30 tablet, Rfl: 0 Allergies  Allergen Reactions   Penicillins Shortness Of Breath and Other (See Comments)    Caused yeast infection Has patient had a PCN reaction causing immediate rash, facial/tongue/throat swelling, SOB or lightheadedness with hypotension: Yes Has patient had a PCN reaction causing severe rash involving mucus membranes or skin necrosis: No Has patient had a PCN reaction that required hospitalization pt was in the hospital at the time of the reaction Has patient had a PCN reaction occurring within the last 10 years: No If all of the above answers are "NO", then may proceed with Cephalosp   Ultram [Tramadol] Other (See Comments)    Made her tongue raw      Social History   Socioeconomic History   Marital status: Single    Spouse name: Not on file   Number of children: 3   Years of education: Not on file    Highest education level: Not on file  Occupational History   Not on file  Tobacco Use   Smoking status: Some Days    Packs/day: 0.25    Years: 3.00    Total pack years: 0.75    Types: Cigarettes   Smokeless tobacco: Never  Vaping Use   Vaping Use: Never used  Substance and Sexual Activity   Alcohol use: Yes    Alcohol/week: 0.0 standard drinks of alcohol    Comment: occasional   Drug use: No    Comment: none for 13 years   Sexual activity: Not on file  Other Topics Concern   Not on file  Social History Narrative   Not on file  Social Determinants of Health   Financial Resource Strain: Low Risk  (04/04/2022)   Overall Financial Resource Strain (CARDIA)    Difficulty of Paying Living Expenses: Not very hard  Food Insecurity: No Food Insecurity (04/04/2022)   Hunger Vital Sign    Worried About Running Out of Food in the Last Year: Never true    Ran Out of Food in the Last Year: Never true  Transportation Needs: No Transportation Needs (04/04/2022)   PRAPARE - Hydrologist (Medical): No    Lack of Transportation (Non-Medical): No  Physical Activity: Not on file  Stress: Not on file  Social Connections: Not on file  Intimate Partner Violence: Not on file    Physical Exam      Future Appointments  Date Time Provider [place]  08/06/2022 12:00 PM [facility] PA/NP [facility] None  01/07/2023 11:30 AM Shamleffer, Melanie Crazier, MD [facility] None       [name], EMT-P-Paramedic [place] [facility]  07/20/22

## 2022-07-21 ENCOUNTER — Encounter (HOSPITAL_COMMUNITY): Payer: Self-pay

## 2022-07-21 ENCOUNTER — Emergency Department (HOSPITAL_COMMUNITY): Payer: Medicare Other

## 2022-07-21 ENCOUNTER — Other Ambulatory Visit: Payer: Self-pay

## 2022-07-21 ENCOUNTER — Observation Stay (HOSPITAL_COMMUNITY)
Admission: EM | Admit: 2022-07-21 | Discharge: 2022-07-22 | Disposition: A | Discharge status: Home / Self Care | Payer: Medicare Other | Attending: Internal Medicine | Admitting: Internal Medicine

## 2022-07-21 DIAGNOSIS — G20A1 Parkinson's disease without dyskinesia, without mention of fluctuations: Secondary | ICD-10-CM | POA: Diagnosis not present

## 2022-07-21 DIAGNOSIS — J45909 Unspecified asthma, uncomplicated: Secondary | ICD-10-CM | POA: Insufficient documentation

## 2022-07-21 DIAGNOSIS — F1410 Cocaine abuse, uncomplicated: Secondary | ICD-10-CM | POA: Diagnosis not present

## 2022-07-21 DIAGNOSIS — Z794 Long term (current) use of insulin: Secondary | ICD-10-CM | POA: Insufficient documentation

## 2022-07-21 DIAGNOSIS — I10 Essential (primary) hypertension: Secondary | ICD-10-CM | POA: Diagnosis present

## 2022-07-21 DIAGNOSIS — Z79899 Other long term (current) drug therapy: Secondary | ICD-10-CM | POA: Insufficient documentation

## 2022-07-21 DIAGNOSIS — I5030 Unspecified diastolic (congestive) heart failure: Secondary | ICD-10-CM | POA: Insufficient documentation

## 2022-07-21 DIAGNOSIS — Z7982 Long term (current) use of aspirin: Secondary | ICD-10-CM | POA: Insufficient documentation

## 2022-07-21 DIAGNOSIS — F418 Other specified anxiety disorders: Secondary | ICD-10-CM | POA: Diagnosis present

## 2022-07-21 DIAGNOSIS — I509 Heart failure, unspecified: Secondary | ICD-10-CM

## 2022-07-21 DIAGNOSIS — Z7902 Long term (current) use of antithrombotics/antiplatelets: Secondary | ICD-10-CM | POA: Insufficient documentation

## 2022-07-21 DIAGNOSIS — E11649 Type 2 diabetes mellitus with hypoglycemia without coma: Principal | ICD-10-CM | POA: Diagnosis present

## 2022-07-21 DIAGNOSIS — F17210 Nicotine dependence, cigarettes, uncomplicated: Secondary | ICD-10-CM | POA: Insufficient documentation

## 2022-07-21 DIAGNOSIS — I2510 Atherosclerotic heart disease of native coronary artery without angina pectoris: Secondary | ICD-10-CM | POA: Diagnosis present

## 2022-07-21 DIAGNOSIS — R0602 Shortness of breath: Secondary | ICD-10-CM | POA: Diagnosis present

## 2022-07-21 DIAGNOSIS — N184 Chronic kidney disease, stage 4 (severe): Secondary | ICD-10-CM | POA: Diagnosis present

## 2022-07-21 DIAGNOSIS — Z7984 Long term (current) use of oral hypoglycemic drugs: Secondary | ICD-10-CM | POA: Insufficient documentation

## 2022-07-21 DIAGNOSIS — I130 Hypertensive heart and chronic kidney disease with heart failure and stage 1 through stage 4 chronic kidney disease, or unspecified chronic kidney disease: Secondary | ICD-10-CM | POA: Insufficient documentation

## 2022-07-21 DIAGNOSIS — N1832 Chronic kidney disease, stage 3b: Secondary | ICD-10-CM

## 2022-07-21 LAB — RAPID URINE DRUG SCREEN, HOSP PERFORMED
Amphetamines: NOT DETECTED
Barbiturates: NOT DETECTED
Benzodiazepines: NOT DETECTED
Cocaine: POSITIVE — AB
Opiates: NOT DETECTED
Tetrahydrocannabinol: NOT DETECTED

## 2022-07-21 LAB — URINALYSIS, ROUTINE W REFLEX MICROSCOPIC
Bilirubin Urine: NEGATIVE
Glucose, UA: NEGATIVE mg/dL
Hgb urine dipstick: NEGATIVE
Ketones, ur: NEGATIVE mg/dL
Nitrite: NEGATIVE
Protein, ur: >=300 mg/dL — AB
Specific Gravity, Urine: 1.005 (ref 1.005–1.030)
pH: 6 (ref 5.0–8.0)

## 2022-07-21 LAB — I-STAT VENOUS BLOOD GAS, ED
Acid-base deficit: 5 mmol/L — ABNORMAL HIGH (ref 0.0–2.0)
Bicarbonate: 19.6 mmol/L — ABNORMAL LOW (ref 20.0–28.0)
Calcium, Ion: 1.08 mmol/L — ABNORMAL LOW (ref 1.15–1.40)
HCT: 26 % — ABNORMAL LOW (ref 36.0–46.0)
Hemoglobin: 8.8 g/dL — ABNORMAL LOW (ref 12.0–15.0)
O2 Saturation: 99 %
Potassium: 4.1 mmol/L (ref 3.5–5.1)
Sodium: 133 mmol/L — ABNORMAL LOW (ref 135–145)
TCO2: 21 mmol/L — ABNORMAL LOW (ref 22–32)
pCO2, Ven: 32.9 mmHg — ABNORMAL LOW (ref 44–60)
pH, Ven: 7.383 (ref 7.25–7.43)
pO2, Ven: 147 mmHg — ABNORMAL HIGH (ref 32–45)

## 2022-07-21 LAB — CBC WITH DIFFERENTIAL/PLATELET
Abs Immature Granulocytes: 0.02 10*3/uL (ref 0.00–0.07)
Basophils Absolute: 0 10*3/uL (ref 0.0–0.1)
Basophils Relative: 0 %
Eosinophils Absolute: 0 10*3/uL (ref 0.0–0.5)
Eosinophils Relative: 0 %
HCT: 31.6 % — ABNORMAL LOW (ref 36.0–46.0)
Hemoglobin: 10.4 g/dL — ABNORMAL LOW (ref 12.0–15.0)
Immature Granulocytes: 0 %
Lymphocytes Relative: 16 %
Lymphs Abs: 0.9 10*3/uL (ref 0.7–4.0)
MCH: 29.1 pg (ref 26.0–34.0)
MCHC: 32.9 g/dL (ref 30.0–36.0)
MCV: 88.3 fL (ref 80.0–100.0)
Monocytes Absolute: 0.5 10*3/uL (ref 0.1–1.0)
Monocytes Relative: 10 %
Neutro Abs: 4.1 10*3/uL (ref 1.7–7.7)
Neutrophils Relative %: 74 %
Platelets: 202 10*3/uL (ref 150–400)
RBC: 3.58 MIL/uL — ABNORMAL LOW (ref 3.87–5.11)
RDW: 15.8 % — ABNORMAL HIGH (ref 11.5–15.5)
WBC: 5.6 10*3/uL (ref 4.0–10.5)
nRBC: 0 % (ref 0.0–0.2)

## 2022-07-21 LAB — BASIC METABOLIC PANEL
Anion gap: 22 — ABNORMAL HIGH (ref 5–15)
Anion gap: 10 (ref 5–15)
BUN: 31 mg/dL — ABNORMAL HIGH (ref 6–20)
BUN: 33 mg/dL — ABNORMAL HIGH (ref 6–20)
CO2: 10 mmol/L — ABNORMAL LOW (ref 22–32)
CO2: 17 mmol/L — ABNORMAL LOW (ref 22–32)
Calcium: 8.8 mg/dL — ABNORMAL LOW (ref 8.9–10.3)
Calcium: 8.2 mg/dL — ABNORMAL LOW (ref 8.9–10.3)
Chloride: 102 mmol/L (ref 98–111)
Chloride: 106 mmol/L (ref 98–111)
Creatinine, Ser: 3.1 mg/dL — ABNORMAL HIGH (ref 0.44–1.00)
Creatinine, Ser: 3.05 mg/dL — ABNORMAL HIGH (ref 0.44–1.00)
GFR, Estimated: 17 mL/min — ABNORMAL LOW (ref >60)
GFR, Estimated: 17 mL/min — ABNORMAL LOW (ref >60)
Glucose, Bld: 104 mg/dL — ABNORMAL HIGH (ref 70–99)
Glucose, Bld: 25 mg/dL — CL (ref 70–99)
Potassium: 4.4 mmol/L (ref 3.5–5.1)
Potassium: 4.2 mmol/L (ref 3.5–5.1)
Sodium: 134 mmol/L — ABNORMAL LOW (ref 135–145)
Sodium: 133 mmol/L — ABNORMAL LOW (ref 135–145)

## 2022-07-21 LAB — TROPONIN I (HIGH SENSITIVITY)
Troponin I (High Sensitivity): 9 ng/L (ref <18)
Troponin I (High Sensitivity): 9 ng/L (ref <18)

## 2022-07-21 LAB — CBG MONITORING, ED
Glucose-Capillary: 163 mg/dL — ABNORMAL HIGH (ref 70–99)
Glucose-Capillary: 174 mg/dL — ABNORMAL HIGH (ref 70–99)
Glucose-Capillary: 177 mg/dL — ABNORMAL HIGH (ref 70–99)
Glucose-Capillary: 23 mg/dL — CL (ref 70–99)
Glucose-Capillary: 66 mg/dL — ABNORMAL LOW (ref 70–99)
Glucose-Capillary: 72 mg/dL (ref 70–99)
Glucose-Capillary: 95 mg/dL (ref 70–99)

## 2022-07-21 LAB — BETA-HYDROXYBUTYRIC ACID: Beta-Hydroxybutyric Acid: 0.34 mmol/L — ABNORMAL HIGH (ref 0.05–0.27)

## 2022-07-21 MED ORDER — DEXTROSE 10 % IV SOLN
100.0000 mL | Freq: Once | INTRAVENOUS | Status: AC
  Administered 2022-07-21: 100 mL via INTRAVENOUS

## 2022-07-21 MED ORDER — LACTATED RINGERS IV BOLUS
1000.0000 mL | Freq: Once | INTRAVENOUS | Status: AC
  Administered 2022-07-21: 1000 mL via INTRAVENOUS

## 2022-07-21 MED ORDER — SODIUM CHLORIDE 0.9 % IV SOLN: 250.0000 mL | INTRAVENOUS | Status: DC | PRN

## 2022-07-21 MED ORDER — ALBUTEROL SULFATE (2.5 MG/3ML) 0.083% IN NEBU
5.0000 mg | INHALATION_SOLUTION | Freq: Once | RESPIRATORY_TRACT | Status: AC
  Administered 2022-07-21: 5 mg via RESPIRATORY_TRACT
  Filled 2022-07-21: qty 6

## 2022-07-21 MED ORDER — DEXTROSE 50 % IV SOLN
1.0000 | Freq: Once | INTRAVENOUS | Status: AC
  Administered 2022-07-21: 50 mL via INTRAVENOUS
  Filled 2022-07-21: qty 50

## 2022-07-21 MED ORDER — IPRATROPIUM BROMIDE 0.02 % IN SOLN
0.5000 mg | Freq: Once | RESPIRATORY_TRACT | Status: AC
  Administered 2022-07-21: 0.5 mg via RESPIRATORY_TRACT
  Filled 2022-07-21: qty 2.5

## 2022-07-21 MED ORDER — LORAZEPAM 2 MG/ML IJ SOLN
1.0000 mg | Freq: Once | INTRAMUSCULAR | Status: AC
  Administered 2022-07-21: 1 mg via INTRAVENOUS
  Filled 2022-07-21: qty 1

## 2022-07-21 NOTE — ED Notes (Signed)
Patient awake and alert, has been giving PO fluids and a snack bag, denies any new concerns, states feels much better, no s/s of distress, will continue to monitor.

## 2022-07-21 NOTE — ED Provider Notes (Signed)
[facility] EMERGENCY DEPARTMENT Provider Note   CSN: [id number] Arrival date & time: 07/21/22  7858     History  Chief Complaint  Patient presents with   Shortness of Breath    Anxiety attack    Carol [name] is a 61 y.o. female. 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 States she last smoked crack about two hours ago. States she also took insulin without eating. Chest pain and shortness of breath began shortly after smoking. Chest pain is like pressure. Radiates to neck but not shoulder, arm or back. No nausea or vomiting. Chest pain is present now while lying in bed. Chest pain is worse on exertion. Got aspirin from EMS but no nitro. SOB also worsens on exertion. Denies cough. Denies fevers or chills. Reports she takes seroquel, zoloft, and hydralazine.     Home Medications Prior to Admission medications   Medication Sig Start Date End Date Taking? Authorizing Provider  albuterol (PROVENTIL HFA;VENTOLIN HFA) 108 (90 Base) MCG/ACT inhaler Inhale 2 puffs into the lungs every 4 (four) hours as needed for wheezing or shortness of breath.    [provider]  amLODipine (NORVASC) 10 MG tablet Take 1 tablet (10 mg total) by mouth daily. 05/11/22 06/10/22  Iona Beard, MD  aspirin EC 81 MG tablet Take 1 tablet (81 mg total) by mouth daily. Swallow whole. 05/11/22    Iona Beard, MD  atorvastatin (LIPITOR) 80 MG tablet Take 1 tablet (80 mg total) by mouth daily. 05/11/22   Iona Beard, MD  budesonide-formoterol [facility]) 160-4.5 MCG/ACT inhaler Inhale 2 puffs into the lungs every morning. 11/09/21   [provider]  cetirizine (ZYRTEC) 10 MG tablet Take 10 mg by mouth daily as needed for allergies.    [provider]  clopidogrel (PLAVIX) 75 MG tablet Take 1 tablet (75 mg total) by mouth daily. 05/11/22   Iona Beard, MD  ergocalciferol (VITAMIN D2) 1.25 MG (50000 UT) capsule Take 1 capsule (50,000 Units total) by mouth once a week. Wednesday 05/11/22   Iona Beard, MD  gabapentin (NEURONTIN) 100 MG capsule Take 2 capsules (200 mg total) by mouth 2 (two) times daily. 05/11/22   Iona Beard, MD  glimepiride (AMARYL) 2 MG tablet Take 2 tablets (4 mg total) by mouth 2 (two) times daily. 05/11/22 07/04/22  Iona Beard, MD  hydrALAZINE (APRESOLINE) 50 MG tablet Take 1 tablet (50 mg total) by mouth 3 (three) times daily. 07/04/22 07/04/23  Rafael Bihari, FNP  insulin glargine (LANTUS) 100 UNIT/ML Solostar Pen Inject 5 Units into the skin at bedtime. 05/11/22   Iona Beard, MD  Insulin Pen Needle (PEN NEEDLES) 32G X 4 MM MISC 1 Units by Does not apply route daily. 05/10/22   Iona Beard, MD  isosorbide mononitrate (IMDUR) 30 MG 24 hr tablet Take 1/2 tablet (15 mg  total) by mouth daily. 05/11/22 06/10/22  Iona Beard, MD  Nebivolol HCl 20 MG TABS Take 1 tablet (20 mg total) by mouth every morning. 05/11/22   Iona Beard, MD  nitroGLYCERIN (NITROSTAT) 0.4 MG SL tablet Place 1 tablet (0.4 mg total) under the tongue every 5 (five) minutes as needed for chest pain. Patient not taking: Reported on 05/30/2022 05/11/22 06/10/22  Iona Beard, MD  pantoprazole (PROTONIX) 40 MG tablet Take 1 tablet (40 mg total) by mouth daily at 12 noon. 05/11/22   Iona Beard, MD  potassium chloride SA (KLOR-CON M) 10 MEQ tablet Take 1 tablet (10  mEq total) by mouth daily. 07/04/22   Rafael Bihari, FNP  QUEtiapine (SEROQUEL XR) 300 MG 24 hr tablet Take 1 tablet (300 mg total) by mouth at bedtime. 05/11/22 06/10/22  Iona Beard, MD  sertraline (ZOLOFT) 50 MG tablet Take 1 tablet (50 mg total) by mouth every evening. 05/11/22 06/10/22  Iona Beard, MD  torsemide (DEMADEX) 20 MG tablet Take 1 tablet (20 mg total) by mouth daily. 07/04/22   Rafael Bihari, FNP      Allergies    Penicillins and Ultram [tramadol]    Review of Systems   Review of Systems  Respiratory:  Positive for shortness of breath.     Physical Exam Updated Vital Signs BP (!) 107/54 (BP Location: Right Arm)   Pulse 76   Temp 98.3 F (36.8 C) (Oral)   Resp (!) 29   Ht 5\' 4"  (1.626 m)   Wt 77.1 kg   SpO2 100%   BMI 29.18 kg/m  Physical Exam Constitutional:      Appearance: She is not toxic-appearing.  HENT:     Head: Normocephalic and atraumatic.  Cardiovascular:     Rate and Rhythm: Normal rate and regular rhythm.  Pulmonary:     Effort: Tachypnea present.     Breath sounds: No wheezing, rhonchi or rales.  Abdominal:     General: There is no distension.     Palpations: Abdomen is soft.     Tenderness: There is no abdominal tenderness.  Skin:    General: Skin is warm and dry.  Neurological:     Mental Status: She is alert and oriented to person, place, and time.     ED Results / Procedures / Treatments   Labs (all labs ordered are listed, but only abnormal results are displayed) Labs Reviewed  CBC WITH DIFFERENTIAL/PLATELET  BASIC METABOLIC PANEL  TROPONIN I (HIGH SENSITIVITY)  TROPONIN I (HIGH SENSITIVITY)    EKG EKG Interpretation  Date/Time:  Saturday July 21 2022 08:55:20 EST Ventricular Rate:  77 PR Interval:  165 QRS Duration: 110 QT Interval:  499 QTC Calculation: 565 R Axis:   58 Text Interpretation: Sinus rhythm Probable left atrial enlargement RSR' in V1 or V2, probably normal variant Minimal ST  depression, lateral leads Prolonged QT interval Confirmed by Lacretia Leigh ([id number]) on 07/21/2022 9:13:00 AM  Radiology DG Chest Port 1 View  Result Date: 07/21/2022 CLINICAL DATA:  Chest pain since last night EXAM: PORTABLE CHEST 1 VIEW COMPARISON:  May 19, 2022 FINDINGS: The heart size and mediastinal contours are stable. The heart size is upper limits of normal. Both lungs are clear. The visualized skeletal structures are unremarkable. IMPRESSION: No active cardiopulmonary disease. Electronically Signed   By: Abelardo Diesel M.D.   On: 07/21/2022 10:13    Procedures Procedures    Medications Ordered in ED Medications  LORazepam (ATIVAN) injection 1 mg (  1 mg Intravenous Given 07/21/22 1017)  ipratropium (ATROVENT) nebulizer solution 0.5 mg (0.5 mg Nebulization Given 07/21/22 1019)  albuterol (PROVENTIL) (2.5 MG/3ML) 0.083% nebulizer solution 5 mg (5 mg Nebulization Given 07/21/22 1019)    ED Course/ Medical Decision Making/ A&P                           Medical Decision Making Amount and/or Complexity of Data Reviewed Labs: ordered. Radiology: ordered.  Risk Prescription drug management.   Patient with history of HFpEF, CAD with NSTEMI 4/23, CKD IV, asthma presenting with SOB and chest pain after crack cocaine use. Oxygenating well. ECG noncontributory. CXR without acute process. Without leukocytosis or significant change in her mild anemia (10.4 today, up from 9.5 2 months ago). Troponins 9>9. Glucose 104. BMP indicating AGMA. Provided albuterol and atrovent neb. Provided Ativan 1 mg IV for anxiety and symptoms improved. Lactated ringers 1 L bolus ordered and will recheck BMP afterward. Beta-hydroxybutyric acid ordered. [place] for discharge if AGMA improves with fluids.         Final Clinical Impression(s) / ED Diagnoses Final diagnoses:  None    Rx / DC Orders ED Discharge Orders     None         Linward Natal, MD 07/21/22 2518    Lacretia Leigh,  MD 07/22/22 [id number]

## 2022-07-21 NOTE — ED Triage Notes (Signed)
Patient called EMS this morning for an anxiety attack, said she took her medication but it didn't help, patient also took her insulin without checking her glucose, CBG was initially 50 upon EMS arrival, patient then c/o chest tightness and EMS administered Aspirin, 324 mg, PO.

## 2022-07-21 NOTE — ED Provider Notes (Signed)
I saw and evaluated the patient, reviewed the resident's note and I agree with the findings and plan.  EKG Interpretation  Date/Time:  Saturday July 21 2022 08:55:20 EST Ventricular Rate:  77 PR Interval:  165 QRS Duration: 110 QT Interval:  499 QTC Calculation: 565 R Axis:   58 Text Interpretation: Sinus rhythm Probable left atrial enlargement RSR' in V1 or V2, probably normal variant Minimal ST depression, lateral leads Prolonged QT interval Confirmed by Lacretia Leigh ([id number]) on 07/21/2022 9:13:42 AM    61 year old female presents with anxiety after using cocaine today.  Patient denies any SI or HI.  We will treat with Ativan and cycle laboratories and likely discharge   Lacretia Leigh, MD 07/21/22 1018

## 2022-07-21 NOTE — Assessment & Plan Note (Signed)
Baseline around 3.0, stable continue to monitor

## 2022-07-21 NOTE — Assessment & Plan Note (Signed)
-  Continue home DAPT and statin after pharmacy med rec is done.   Holding beta-blocker with chronic history of +cocaine use

## 2022-07-21 NOTE — H&P (Signed)
History and Physical    Patient: Anel Creighton EXH:[id number] DOB: 08-21-1960 DOA: 07/21/2022 DOS: the patient was seen and examined on 07/22/2022 PCP: Simona Huh, NP  Patient coming from: Home - lives alone, uses walker.    Chief Complaint: shortness of breath/chest pain and hypoglycemia   HPI: Athanasia Stanwood is a 61 y.o. female with medical history significant of CKD stage IV, T2DM, cocaine abuse, HFpEF, HTN, bipolar, anxiety and depression, CAD with NSTEMI in 12/2021 who presented to ED with complaints of anxiety and shortness of breath and chest pain after using cocaine last night. She also found her blood sugar to be low. She is sleeping and a poor historian. She tells me she used cocaine 2 days ago and then around 1a started to have shortness of breath and chest pain/however, she told EDP that she smoked crack cocaine about 2 hours prior to her symptoms onset and thought it was due to the cocaine.  She checked her sugar and it was low, called EMS. She last took her insulin, 20 units, last night and can't really tell me when she took her glimepiride last. Med rec says 5 units of lantus, unsure how accurate this is. She denies poor PO intake until around 1am today. Chest pain has resolved since given ativan earlier today. She has not been short of breath. She is trying to eat, but doesn't have much of an appetite.    Denies any fever/chills, vision changes/headaches, current chest pain or palpitations, shortness of breath or cough, abdominal pain, N/V/D, dysuria or leg swelling.   She denies any smoking or alcohol use. Tells me she uses cocaine once/month.   ER Course:  vitals: afebrile, bp: 107/54, HR: 76, RR: 29, oxygen: 100%RA Pertinent labs: hgb: 10.4, co2: 20>17, BUN: 31, creatinine: 3.10, AG of 22>10, BS: 25,  CXR: clear In ED: bolused 1L in ED, given ativan, dextrose, D10. TRH asked to admit.    Review of Systems: As mentioned in the history of present illness.  All other systems reviewed and are negative. 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   Chronic back pain    Depression    Diabetes mellitus    GERD (gastroesophageal reflux disease)    Heart murmur    Hypertension    NSTEMI (non-ST elevated myocardial infarction) ([place]) 12/20/2021   Palpitations    Parkinson's disease    Past Surgical History:  Procedure Laterality Date   CARPAL TUNNEL RELEASE     IR FLUORO GUIDE CV LINE RIGHT  01/26/2022   IR US GUIDE VASC ACCESS RIGHT  01/26/2022   LEFT HEART CATH AND CORONARY ANGIOGRAPHY N/A 12/20/2021   Procedure: LEFT HEART CATH AND CORONARY ANGIOGRAPHY;  Surgeon: Burnell Blanks, MD;  Location: [place] [facility];  Service: Cardiovascular;  Laterality: N/A;   OPEN REDUCTION INTERNAL FIXATION (ORIF) FINGER WITH RADIAL BONE GRAFT Right 09/12/2015   Procedure: RIGHT MIDDLE FINGER CLOSED REDUCTION AND PINNING;  Surgeon: Iran Planas, MD;  Location: [facility];  Service: Orthopedics;  Laterality: Right;   TONSILLECTOMY     TUBAL LIGATION     Social History:  reports that she has been smoking cigarettes. She has a 0.75 pack-year smoking history. She has never used smokeless tobacco. She reports current alcohol use. She reports that she does  not use drugs.  Allergies  Allergen Reactions   Penicillins Shortness Of Breath and Other (See Comments)    Caused yeast infection Has patient had a PCN reaction causing immediate rash, facial/tongue/throat swelling, SOB or lightheadedness with hypotension: Yes Has patient had a PCN reaction causing severe rash involving mucus membranes or skin necrosis: No Has patient had a PCN reaction that required hospitalization pt was in the hospital at the time of the reaction Has patient had a PCN reaction occurring within the last 10 years:  No If all of the above answers are "NO", then may proceed with Cephalosp   Ultram [Tramadol] Other (See Comments)    Made her tongue raw    Family History  Problem Relation Age of Onset   Diabetes Mother    Kidney disease Mother    Seizures Father    Hypertension Father    Stroke Father    Asthma Other     Prior to Admission medications   Medication Sig Start Date End Date Taking? Authorizing Provider  albuterol (PROVENTIL HFA;VENTOLIN HFA) 108 (90 Base) MCG/ACT inhaler Inhale 2 puffs into the lungs every 4 (four) hours as needed for wheezing or shortness of breath.    [provider]  amLODipine (NORVASC) 10 MG tablet Take 1 tablet (10 mg total) by mouth daily. 05/11/22 06/10/22  Iona Beard, MD  aspirin EC 81 MG tablet Take 1 tablet (81 mg total) by mouth daily. Swallow whole. 05/11/22   Iona Beard, MD  atorvastatin (LIPITOR) 80 MG tablet Take 1 tablet (80 mg total) by mouth daily. 05/11/22   Iona Beard, MD  budesonide-formoterol [facility]) 160-4.5 MCG/ACT inhaler Inhale 2 puffs into the lungs every morning. 11/09/21   [provider]  cetirizine (ZYRTEC) 10 MG tablet Take 10 mg by mouth daily as needed for allergies.    [provider]  clopidogrel (PLAVIX) 75 MG tablet Take 1 tablet (75 mg total) by mouth daily. 05/11/22   Iona Beard, MD  ergocalciferol (VITAMIN D2) 1.25 MG (50000 UT) capsule Take 1 capsule (50,000 Units total) by mouth once a week. Wednesday 05/11/22   Iona Beard, MD  gabapentin (NEURONTIN) 100 MG capsule Take 2 capsules (200 mg total) by mouth 2 (two) times daily. 05/11/22   Iona Beard, MD  glimepiride (AMARYL) 2 MG tablet Take 2 tablets (4 mg total) by mouth 2 (two) times daily. 05/11/22 07/04/22  Iona Beard, MD  hydrALAZINE (APRESOLINE) 50 MG tablet Take 1 tablet (50 mg total) by mouth 3 (three) times daily. 07/04/22 07/04/23  Rafael Bihari, FNP  insulin glargine (LANTUS) 100 UNIT/ML Solostar Pen Inject 5 Units  into the skin at bedtime. 05/11/22   Iona Beard, MD  Insulin Pen Needle (PEN NEEDLES) 32G X 4 MM MISC 1 Units by Does not apply route daily. 05/10/22   Iona Beard, MD  isosorbide mononitrate (IMDUR) 30 MG 24 hr tablet Take 1/2 tablet (15 mg total) by mouth daily. 05/11/22 06/10/22  Iona Beard, MD  Nebivolol HCl 20 MG TABS Take 1 tablet (20 mg total) by mouth every morning. 05/11/22   Iona Beard, MD  nitroGLYCERIN (NITROSTAT) 0.4 MG SL tablet Place 1 tablet (0.4 mg total) under the tongue every 5 (five) minutes as needed for chest pain. Patient not taking: Reported on 05/30/2022 05/11/22 06/10/22  Iona Beard, MD  pantoprazole (PROTONIX) 40 MG tablet Take 1 tablet (40 mg total) by mouth daily at 12 noon. 05/11/22   Iona Beard, MD  potassium chloride SA (KLOR-CON  M) 10 MEQ tablet Take 1 tablet (10 mEq total) by mouth daily. 07/04/22   Rafael Bihari, FNP  QUEtiapine (SEROQUEL XR) 300 MG 24 hr tablet Take 1 tablet (300 mg total) by mouth at bedtime. 05/11/22 06/10/22  Iona Beard, MD  sertraline (ZOLOFT) 50 MG tablet Take 1 tablet (50 mg total) by mouth every evening. 05/11/22 06/10/22  Iona Beard, MD  torsemide (DEMADEX) 20 MG tablet Take 1 tablet (20 mg total) by mouth daily. 07/04/22   Rafael Bihari, FNP    Physical Exam: Vitals:   07/21/22 1800 07/21/22 1900 07/21/22 1950 07/21/22 2124  BP: 129/68  128/74   Pulse: 68 65 60   Resp: 20 13 12    Temp:    97.6 F (36.4 C)  TempSrc:    Oral  SpO2: 98% 100% 99%   Weight:      Height:       General:  Appears calm and comfortable and is in NAD Eyes:  PERRL, EOMI, normal lids, iris ENT:  grossly normal hearing, lips & tongue, mmm; appropriate dentition Neck:  no LAD, masses or thyromegaly; no carotid bruits Cardiovascular:  RRR, no m/r/g. No LE edema.  Respiratory:   CTA bilaterally with no wheezes/rales/rhonchi.  Normal respiratory effort. Abdomen:  soft, NT, ND, NABS Back:   normal alignment, no CVAT Skin:   no rash or induration seen on limited exam Musculoskeletal:  grossly normal tone BUE/BLE, good ROM, no bony abnormality Lower extremity:  No LE edema.  Limited foot exam with no ulcerations.  2+ distal pulses. Psychiatric:  grossly normal mood and affect, speech fluent and appropriate, AOx3 Neurologic:  CN 2-12 grossly intact, moves all extremities in coordinated fashion, sensation intact   Radiological Exams on Admission: Independently reviewed - see discussion in A/P where applicable  DG Chest Port 1 View  Result Date: 07/21/2022 CLINICAL DATA:  Chest pain since last night EXAM: PORTABLE CHEST 1 VIEW COMPARISON:  May 19, 2022 FINDINGS: The heart size and mediastinal contours are stable. The heart size is upper limits of normal. Both lungs are clear. The visualized skeletal structures are unremarkable. IMPRESSION: No active cardiopulmonary disease. Electronically Signed   By: Abelardo Diesel M.D.   On: 07/21/2022 10:13    EKG: Independently reviewed.  NSR with rate 77; nonspecific ST changes with no evidence of acute ischemia. Prolonged qt   Labs on Admission: I have personally reviewed the available labs and imaging studies at the time of the admission.  Pertinent labs:    hgb: 10.4,  co2: 20>17,  BUN: 31,  creatinine: 3.10,  AG of 22>10,  BS: 25,   Assessment and Plan: Principal Problem:   Diabetic hypoglycemia (HCC) Active Problems:   chest pain/shortness of breath in setting of Cocaine abuse-now resolved   CHF (congestive heart failure) (HCC)   CKD (chronic kidney disease), stage IV (HCC)   Essential hypertension   CAD in native artery   Anxiety with depression    Assessment and Plan: * Diabetic hypoglycemia ([place]) 61 year old presenting to ED with persistent hypoglycemia in setting of recent cocaine use. States she thinks she took 20 units of lantus; however, med rec states 5 units. Waiting for med rec update to verify home dosing.  -obs to telemetry  -BS has  dropped multiple times despite d50 and D10 IVF x 46ml.  -hold insulin/sulfonylureas -encourage PO intake -CBG q 2 hours -CBG stable x 2, continue to monitor. If drops will start D10 infusion  -A1C  in 04/2022 was 6.5  -f/u on med rec to see correct insulin she should be taking    chest pain/shortness of breath in setting of Cocaine abuse-now resolved UDS +cocaine Symptoms resolved after IV ativan Troponin wnl x 2  Avoid beta blockers  Resting comfortably  SW consult for cocaine abuse   CHF (congestive heart failure) ([place]) Echo 12/2021 with EF normal and grade 1 DD.  Euvolemic Strict I/O Continue medical management Encouraged cessation of cocaine   CKD (chronic kidney disease), stage IV (HCC) Baseline around 3.0, stable continue to monitor   Essential hypertension Has been decently controlled Continue home medication when med rec completed   CAD in native artery -Continue home DAPT and statin after pharmacy med rec is done.   Holding beta-blocker with chronic history of +cocaine use   Anxiety with depression F/u on med rec to see if still on zoloft and seroquel      Advance Care Planning:   Code Status: Full Code   Consults: sw  DVT Prophylaxis: heparin [place]  Family Communication: none  Severity of Illness: The appropriate patient status for this patient is OBSERVATION. Observation status is judged to be reasonable and necessary in order to provide the required intensity of service to ensure the patient's safety. The patient's presenting symptoms, physical exam findings, and initial radiographic and laboratory data in the context of their medical condition is felt to place them at decreased risk for further clinical deterioration. Furthermore, it is anticipated that the patient will be medically stable for discharge from the hospital within 2 midnights of admission.   Author: Orma Flaming, MD 07/22/2022 12:33 AM  For on call review [email or web address].

## 2022-07-21 NOTE — ED Provider Notes (Signed)
Accepted handoff at shift change from Johny Blamer MD. Please see prior provider note for more detail.   Briefly: Patient is 61 y.o.   Patient is a 61 year old female with COPD, obesity, DM2, HTN, CAD, CKD, cocaine abuse hypertensive urgency/emergency, CHF, atypical chest pain  Patient here with chest pain shortness of breath in the setting of cocaine use   Plan: Reassess labs after hydration anticipate discharge home.    Physical Exam  BP (!) 162/80   Pulse 76   Temp 98 F (36.7 C) (Oral)   Resp 16   Ht 5\' 4"  (1.626 m)   Wt 77.1 kg   SpO2 97%   BMI 29.18 kg/m   Physical Exam Vitals and nursing note reviewed.  Constitutional:      General: She is not in acute distress.    Appearance: Normal appearance. She is not ill-appearing.  HENT:     Head: Normocephalic and atraumatic.     Nose: Nose normal.  Eyes:     General: No scleral icterus.       Right eye: No discharge.        Left eye: No discharge.     Conjunctiva/sclera: Conjunctivae normal.  Cardiovascular:     Rate and Rhythm: Normal rate and regular rhythm.     Pulses: Normal pulses.     Heart sounds: Normal heart sounds.  Pulmonary:     Effort: Pulmonary effort is normal. No respiratory distress.     Breath sounds: No stridor. No wheezing.  Abdominal:     Palpations: Abdomen is soft.     Tenderness: There is no abdominal tenderness.  Musculoskeletal:     Cervical back: Normal range of motion.     Right lower leg: No edema.     Left lower leg: No edema.  Skin:    General: Skin is warm and dry.     Capillary Refill: Capillary refill takes less than 2 seconds.  Neurological:     Mental Status: She is alert and oriented to person, place, and time. Mental status is at baseline.  Psychiatric:        Mood and Affect: Mood normal.        Behavior: Behavior normal.     Procedures  .Critical Care  Performed by: Tedd Sias, PA Authorized by: Tedd Sias, PA   Critical care provider statement:     Critical care time (minutes):  35   Critical care time was exclusive of:  Separately billable procedures and treating other patients and teaching time   Critical care was necessary to treat or prevent imminent or life-threatening deterioration of the following conditions: Recalcitrant hypoglycemia.   Critical care was time spent personally by me on the following activities:  Development of treatment plan with patient or surrogate, review of old charts, re-evaluation of patient's condition, pulse oximetry, ordering and review of radiographic studies, ordering and review of laboratory studies, ordering and performing treatments and interventions, obtaining history from patient or surrogate, examination of patient and evaluation of patient's response to treatment   Care discussed with: admitting provider     ED Course / MDM   Clinical Course as of 07/21/22 2231  Sat Jul 21, 2022  2008 CBG has dropped slightly. Will continue to eat more. She's not had insulin since yesterday AM. No fevers, she feels completely better.  [WF]  2230 CBG is 66 [WF]    Clinical Course User Index [WF] Tedd Sias, PA   Medical Decision Making  Amount and/or Complexity of Data Reviewed Labs: ordered. Radiology: ordered.  Risk Prescription drug management. Decision regarding hospitalization.   On my reassessment patient states she feels generally well.  Moves all 4 extremities, sensation intact, smile symmetric.  Vital signs appropriate/within normal limits apart from mild hypertension.  BMP with anion gap and bicarb of 10 perhaps from dehydration she did take some aspirin on the way here by EMS but took an appropriate dose.  CBC with chronic anemia.  She is giving breathing treatment, Ativan, lactated Ringer's 1 L   I reassessed patient numerous times during her ER visit she has been here in the emergency room for 13 hours and at this point she has had several episodes where her blood sugar dropped.  I  have personally provided patient with peanut butter crackers and reassessed and she continues to have hypoglycemic dips.   Most recent CBG at 1030 is 66.  Will give dextrose infusion/drip and admit to hospitalist.  Labs initially with anion gap which then resolved.  Initial blood sugar of 104 then dropped to 25.  104, 25, 134, 72, 95, 66  Low suspicion for infection she has no urinary symptoms.  Likely this is secondary to cocaine use.  Discussed with Dr. Eliberto Ivory who will admit.  Patient on dextrose infusion.    Tedd Sias, [place] 07/21/22 2345    Wyvonnia Dusky, MD 07/22/22 [phone]

## 2022-07-21 NOTE — ED Notes (Signed)
Patient resting in bed with eyes closed, no s/s of distress, will continue to monitor.  

## 2022-07-21 NOTE — Assessment & Plan Note (Signed)
UDS pending

## 2022-07-21 NOTE — Assessment & Plan Note (Signed)
Echo 12/2021 with EF normal and grade 1 DD.  Euvolemic Strict I/O Continue medical management Encouraged cessation of cocaine

## 2022-07-22 DIAGNOSIS — F1410 Cocaine abuse, uncomplicated: Secondary | ICD-10-CM

## 2022-07-22 DIAGNOSIS — N184 Chronic kidney disease, stage 4 (severe): Secondary | ICD-10-CM

## 2022-07-22 DIAGNOSIS — F418 Other specified anxiety disorders: Secondary | ICD-10-CM

## 2022-07-22 DIAGNOSIS — I2510 Atherosclerotic heart disease of native coronary artery without angina pectoris: Secondary | ICD-10-CM

## 2022-07-22 DIAGNOSIS — I10 Essential (primary) hypertension: Secondary | ICD-10-CM | POA: Diagnosis not present

## 2022-07-22 DIAGNOSIS — E11649 Type 2 diabetes mellitus with hypoglycemia without coma: Secondary | ICD-10-CM | POA: Diagnosis not present

## 2022-07-22 LAB — CBC
HCT: 31.4 % — ABNORMAL LOW (ref 36.0–46.0)
Hemoglobin: 10.8 g/dL — ABNORMAL LOW (ref 12.0–15.0)
MCH: 29.3 pg (ref 26.0–34.0)
MCHC: 34.4 g/dL (ref 30.0–36.0)
MCV: 85.1 fL (ref 80.0–100.0)
Platelets: 222 10*3/uL (ref 150–400)
RBC: 3.69 MIL/uL — ABNORMAL LOW (ref 3.87–5.11)
RDW: 15.3 % (ref 11.5–15.5)
WBC: 4.7 10*3/uL (ref 4.0–10.5)
nRBC: 0 % (ref 0.0–0.2)

## 2022-07-22 LAB — CBG MONITORING, ED
Glucose-Capillary: 44 mg/dL — CL (ref 70–99)
Glucose-Capillary: 60 mg/dL — ABNORMAL LOW (ref 70–99)
Glucose-Capillary: 57 mg/dL — ABNORMAL LOW (ref 70–99)
Glucose-Capillary: 95 mg/dL (ref 70–99)
Glucose-Capillary: 85 mg/dL (ref 70–99)
Glucose-Capillary: 71 mg/dL (ref 70–99)
Glucose-Capillary: 96 mg/dL (ref 70–99)
Glucose-Capillary: 167 mg/dL — ABNORMAL HIGH (ref 70–99)

## 2022-07-22 LAB — BASIC METABOLIC PANEL
Anion gap: 12 (ref 5–15)
BUN: 30 mg/dL — ABNORMAL HIGH (ref 6–20)
CO2: 19 mmol/L — ABNORMAL LOW (ref 22–32)
Calcium: 8.7 mg/dL — ABNORMAL LOW (ref 8.9–10.3)
Chloride: 106 mmol/L (ref 98–111)
Creatinine, Ser: 2.62 mg/dL — ABNORMAL HIGH (ref 0.44–1.00)
GFR, Estimated: 20 mL/min — ABNORMAL LOW (ref >60)
Glucose, Bld: 96 mg/dL (ref 70–99)
Potassium: 4.2 mmol/L (ref 3.5–5.1)
Sodium: 137 mmol/L (ref 135–145)

## 2022-07-22 MED ORDER — ACETAMINOPHEN 650 MG RE SUPP: 650.0000 mg | Freq: Four times a day (QID) | RECTAL | Status: DC | PRN

## 2022-07-22 MED ORDER — HEPARIN SODIUM (PORCINE) 5000 UNIT/ML IJ SOLN: 5000.0000 [IU] | Freq: Three times a day (TID) | INTRAMUSCULAR | Status: DC

## 2022-07-22 MED ORDER — CLOPIDOGREL BISULFATE 75 MG PO TABS: 75.0000 mg | ORAL_TABLET | Freq: Every day | ORAL | Status: DC

## 2022-07-22 MED ORDER — SODIUM CHLORIDE 0.9 % IV SOLN: 250.0000 mL | INTRAVENOUS | Status: DC | PRN

## 2022-07-22 MED ORDER — PANTOPRAZOLE SODIUM 40 MG PO TBEC: 40.0000 mg | DELAYED_RELEASE_TABLET | Freq: Every day | ORAL | Status: DC

## 2022-07-22 MED ORDER — AMLODIPINE BESYLATE 5 MG PO TABS
10.0000 mg | ORAL_TABLET | Freq: Every day | ORAL | Status: DC
  Administered 2022-07-22: 10 mg via ORAL
  Filled 2022-07-22 (×2): qty 2

## 2022-07-22 MED ORDER — ISOSORBIDE MONONITRATE ER 30 MG PO TB24
15.0000 mg | ORAL_TABLET | Freq: Every day | ORAL | Status: DC
  Administered 2022-07-22: 15 mg via ORAL
  Filled 2022-07-22 (×2): qty 1

## 2022-07-22 MED ORDER — ASPIRIN 81 MG PO TBEC
81.0000 mg | DELAYED_RELEASE_TABLET | Freq: Every day | ORAL | Status: DC
  Administered 2022-07-22: 81 mg via ORAL
  Filled 2022-07-22 (×2): qty 1

## 2022-07-22 MED ORDER — ACETAMINOPHEN 325 MG PO TABS
650.0000 mg | ORAL_TABLET | Freq: Four times a day (QID) | ORAL | Status: DC | PRN
  Administered 2022-07-22: 650 mg via ORAL
  Filled 2022-07-22: qty 2

## 2022-07-22 MED ORDER — MOMETASONE FURO-FORMOTEROL FUM 200-5 MCG/ACT IN AERO
2.0000 | INHALATION_SPRAY | Freq: Two times a day (BID) | RESPIRATORY_TRACT | Status: DC
  Filled 2022-07-22 (×2): qty 8.8

## 2022-07-22 MED ORDER — HYDRALAZINE HCL 20 MG/ML IJ SOLN
5.0000 mg | Freq: Three times a day (TID) | INTRAMUSCULAR | Status: DC | PRN
  Administered 2022-07-22: 5 mg via INTRAVENOUS
  Filled 2022-07-22: qty 1

## 2022-07-22 MED ORDER — HYDRALAZINE HCL 25 MG PO TABS: 50.0000 mg | ORAL_TABLET | Freq: Three times a day (TID) | ORAL | Status: DC

## 2022-07-22 MED ORDER — SODIUM CHLORIDE 0.9% FLUSH
3.0000 mL | Freq: Two times a day (BID) | INTRAVENOUS | Status: DC
  Administered 2022-07-22: 3 mL via INTRAVENOUS

## 2022-07-22 MED ORDER — GABAPENTIN 100 MG PO CAPS
200.0000 mg | ORAL_CAPSULE | Freq: Two times a day (BID) | ORAL | Status: DC
  Administered 2022-07-22: 200 mg via ORAL
  Filled 2022-07-22 (×2): qty 2

## 2022-07-22 MED ORDER — NEBIVOLOL HCL 20 MG PO TABS: 20.0000 mg | ORAL_TABLET | Freq: Every morning | ORAL | Status: DC

## 2022-07-22 MED ORDER — SODIUM CHLORIDE 0.9% FLUSH: 3.0000 mL | INTRAVENOUS | Status: DC | PRN

## 2022-07-22 MED ORDER — DEXTROSE 10 % IV SOLN
100.0000 mL | Freq: Once | INTRAVENOUS | Status: DC
  Administered 2022-07-22: 100 mL via INTRAVENOUS

## 2022-07-22 MED ORDER — ATORVASTATIN CALCIUM 40 MG PO TABS
80.0000 mg | ORAL_TABLET | Freq: Every day | ORAL | Status: DC
  Administered 2022-07-22: 80 mg via ORAL
  Filled 2022-07-22 (×2): qty 2

## 2022-07-22 MED ORDER — DEXTROSE 10 % IV SOLN: INTRAVENOUS | Status: DC

## 2022-07-22 NOTE — Care Management Obs Status (Signed)
[facility] NOTIFICATION   Patient Details  Name: Carol [name] MRN: [id number] Date of Birth: 04/20/1961   Medicare Observation Status Notification Given:  Yes    Verdell Carmine, RN 07/22/2022, 2:00 PM

## 2022-07-22 NOTE — ED Notes (Signed)
MD Rogers Blocker notified of critical CBG.

## 2022-07-22 NOTE — Discharge Instructions (Signed)
                  Intensive Outpatient Programs  [facility]    [facility] [address]     [address] [place],  [place]     [place], [place] [phone]      [phone]  [facility]   [facility]  (Inpatient and outpatient)  [phone] (Suboxone and Methadone) [address]           [phone]           [facility]    [facility] - Intensive Outpatient [address]     [address] [place], [place] [address]     [place], [place]  [phone]      [phone]  [facility] (Outpatient, Inpatient, [facility]  [facility] (Groups and [facility]idental) (insurance only) [phone]    [place], [place]          [phone]       [facility]    [facility] (for caregivers and family) [address]     [address] [place], [place]     [place], [place] [phone]      [phone]  Residential Treatment Programs  [facility]  [place](2 years) Residential: 90 days)  [facility] [address]      [address] [place], [place]     [place], [place] [phone]      [phone] or [phone]  [facility]    [facility] [address]      [address] [place], [place]     [place], [place] [phone]      [phone]  [facility]   [facility] ([facility] [address]     [address] [place], [place] [address]     [place], [place] [phone]      [phone] Admissions: 8am-3pm M-F  [facility]: Residential Program (90 Days)              [facility]: [facility]  [place], [place]     [place], [place]  [phone] or [phone]    (Walk in Hours over the weekend or by referral)   [facility]: [facility]:[phone] (for crisis  response 24 hours a day) 

## 2022-07-22 NOTE — Hospital Course (Addendum)
Carol [name] is a 61 y.o. female with past medical history of CKD stage IV, type 2 diabetes, cocaine abuse, heart failure with preserved ejection fraction, hypertension, bipolar disorder, anxiety and depression, coronary artery disease status post non-ST elevation 5/23 presented to hospital with complaints of chest pain and anxiety shortness of breath after using cocaine.  She was also noted to have low blood glucose levels.  Patient had a smoked crack cocaine about 2 hours prior to her symptoms onset.  Chest pain had improved with Ativan.  In the ED initial vitals were stable.  Labs showed creatinine of 3.1 with elevated.  Patient received 1 L of IV fluid bolus, albumin, dextrose 10% and was admitted hospital for further evaluation and treatment.    Hypoglycemia. Secondary to taking excessive dose of Lantus.   Hold insulin and sulfonylureas.  Encourage oral intake.  Latest hemoglobin A1c 04/2022 was 6.5.  Latest POC glucose of 85.    chest pain/shortness of breath in setting of Cocaine abuse-now resolved Urine drug screen was positive for cocaine.  Avoid beta-blockers. Patient was counseled about it.   CHF (congestive heart failure)  Compensated at this time.  Continue intake and output charting Daily weights.  Review of previous 2D echo 12/2021 with EF normal and grade 1 DD.  Counseled on quitting cocaine.  Continue aspirin Plavix Lipitor.  Hold beta-blocker for now.   CKD (chronic kidney disease), stage IV (HCC) Baseline around 3.0, creatinine today at 2.6.  Received IV fluids.   Essential hypertension Elevated at this time.  Recent cocaine abuse.  On nebivolol, amlodipine, hydralazine outpatient.  Will hold nebivolol.  Resume home medication.  Medication reconciliation is still pending.   CAD in native artery Continue aspirin Plavix Lipitor Imdur.  Hold beta-blocker for now.   Anxiety with depression Med rec pending.

## 2022-07-22 NOTE — Discharge Summary (Signed)
Physician Discharge Summary  Carol [name] YPP:[id number] DOB: Jul 26, 1961 DOA: 07/21/2022  PCP: Simona Huh, NP  Admit date: 07/21/2022 Discharge date: 07/22/2022  Admitted From: Home  Discharge disposition: Home    Recommendations for Outpatient Follow-Up:   Follow up with your primary care provider in one week.  Check CBC, BMP, magnesium in the next visit  Discharge Diagnosis:   Principal Problem:   Diabetic hypoglycemia ([place]) Active Problems:   chest pain/shortness of breath in setting of Cocaine abuse-now resolved   CHF (congestive heart failure) (HCC)   CKD (chronic kidney disease), stage IV ([place])   Essential hypertension   CAD in native artery   Anxiety with depression   Discharge Condition: Improved.  Diet recommendation: Low sodium, heart healthy.    Wound care: None.  Code status: Full.   History of Present Illness:   Carol [name] is a 61 y.o. female with past medical history of CKD stage IV, type 2 diabetes, cocaine abuse, heart failure with preserved ejection fraction, hypertension, bipolar disorder, anxiety and depression, coronary artery disease status post non-ST elevation 5/23 presented to hospital with complaints of chest pain, anxiety and shortness of breath after using cocaine.  She was also noted to have low blood glucose levels.  Patient had a smoked crack cocaine about 2 hours prior to her symptoms onset.  Chest pain had improved with Ativan.  In the ED, initial vitals were stable.  Labs showed creatinine of 3.1 with elevated.  Patient received 1 L of IV fluid bolus, albumin, dextrose 10% and was admitted hospital for further evaluation and treatment.    Hospital Course:   Following conditions were addressed during hospitalization as listed below,  Hypoglycemia. Secondary to taking excessive dose of Lantus.   Latest hemoglobin A1c 04/2022 was 6.5.  Was advised to hold Amaryl and metformin today and stick on low-dose Lantus  from home.    chest pain/shortness of breath in setting of Cocaine abuse-now resolved Urine drug screen was positive for cocaine.  Patient was counseled about it.   CHF (congestive heart failure)  Compensated at this time.  Review of previous 2D echo 12/2021 with EF normal and grade 1 DD.  Counseled on quitting cocaine.  Continue aspirin Plavix Lipitor.     CKD (chronic kidney disease), stage IV (HCC) Baseline around 3.0, creatinine today at 2.6.     Essential hypertension Elevated at this time.  Recent cocaine abuse.  On nebivolol, amlodipine, hydralazine outpatient.  Beta-blocker was kept on hold during hospitalization.  Resume home medication on discharge.  Counseled against cocaine   CAD in native artery Continue aspirin Plavix Lipitor Imdur, beta-blocker   Anxiety with depression   Disposition.  At this time, patient is stable for disposition home with outpatient PCP follow-up  Medical Consultants:   None.  Procedures:    None Subjective:   Today, patient seen and examined at bedside.  Denies any chest pain, palpitation, fever, chills or rigor.  Wants to go home.  Discharge Exam:   Vitals:   07/22/22 1400 07/22/22 1409  BP: (!) 136/100   Pulse: 85   Resp: 18   Temp:  98.1 F (36.7 C)  SpO2: 99%    Vitals:   07/22/22 0930 07/22/22 1000 07/22/22 1400 07/22/22 1409  BP: (!) 165/96 (!) 171/79 (!) 136/100   Pulse: 84 84 85   Resp: (!) 27 (!) 26 18   Temp:    98.1 F (36.7 C)  TempSrc:    Oral  SpO2: 97% 97% 99%   Weight:      Height:       General: Alert awake, not in obvious distress HENT: pupils equally reacting to light,  No scleral pallor or icterus noted. Oral mucosa is moist.  Chest:  Clear breath sounds.  Diminished breath sounds bilaterally. No crackles or wheezes.  CVS: S1 &S2 heard. No murmur.  Regular rate and rhythm. Abdomen: Soft, nontender, nondistended.  Bowel sounds are heard.   Extremities: No cyanosis, clubbing or edema.  Peripheral pulses  are palpable. Psych: Alert, awake and oriented, normal mood CNS:  No cranial nerve deficits.  Power equal in all extremities.   Skin: Warm and dry.  No rashes noted.  The results of significant diagnostics from this hospitalization (including imaging, microbiology, ancillary and laboratory) are listed below for reference.     Diagnostic Studies:   DG Chest Port 1 View  Result Date: 07/21/2022 CLINICAL DATA:  Chest pain since last night EXAM: PORTABLE CHEST 1 VIEW COMPARISON:  May 19, 2022 FINDINGS: The heart size and mediastinal contours are stable. The heart size is upper limits of normal. Both lungs are clear. The visualized skeletal structures are unremarkable. IMPRESSION: No active cardiopulmonary disease. Electronically Signed   By: Abelardo Diesel M.D.   On: 07/21/2022 10:13    Labs:   Basic Metabolic Panel: Recent Labs  Lab 07/21/22 0941 07/21/22 1700 07/21/22 1715 07/22/22 0504  NA 134* 133* 133* 137  K 4.4 4.2 4.1 4.2  CL 102 106  --  106  CO2 10* 17*  --  19*  GLUCOSE 104* 25*  --  96  BUN 31* 33*  --  30*  CREATININE 3.10* 3.05*  --  2.62*  CALCIUM 8.8* 8.2*  --  8.7*   GFR Estimated Creatinine Clearance: 23.2 mL/min (A) (by C-G formula based on SCr of 2.62 mg/dL (H)). Liver Function Tests: No results for input(s): "AST", "ALT", "ALKPHOS", "BILITOT", "PROT", "ALBUMIN" in the last 168 hours. No results for input(s): "LIPASE", "AMYLASE" in the last 168 hours. No results for input(s): "AMMONIA" in the last 168 hours. Coagulation profile No results for input(s): "INR", "PROTIME" in the last 168 hours.  CBC: Recent Labs  Lab 07/21/22 0941 07/21/22 1715 07/22/22 0504  WBC 5.6  --  4.7  NEUTROABS 4.1  --   --   HGB 10.4* 8.8* 10.8*  HCT 31.6* 26.0* 31.4*  MCV 88.3  --  85.1  PLT 202  --  222   Cardiac Enzymes: No results for input(s): "CKTOTAL", "CKMB", "CKMBINDEX", "TROPONINI" in the last 168 hours. BNP: Invalid input(s): "POCBNP" CBG: Recent Labs   Lab 07/22/22 0355 07/22/22 0459 07/22/22 0702 07/22/22 0821 07/22/22 1115  GLUCAP 57* 95 85 71 96   D-Dimer No results for input(s): "DDIMER" in the last 72 hours. Hgb A1c No results for input(s): "HGBA1C" in the last 72 hours. Lipid Profile No results for input(s): "CHOL", "HDL", "LDLCALC", "TRIG", "CHOLHDL", "LDLDIRECT" in the last 72 hours. Thyroid function studies No results for input(s): "TSH", "T4TOTAL", "T3FREE", "THYROIDAB" in the last 72 hours.  Invalid input(s): "FREET3" Anemia work up No results for input(s): "VITAMINB12", "FOLATE", "FERRITIN", "TIBC", "IRON", "RETICCTPCT" in the last 72 hours. Microbiology No results found for this or any previous visit (from the past 240 hour(s)).   Discharge Instructions:   Discharge Instructions     Diet Carb Modified   Complete by: As directed    Discharge instructions   Complete by: As directed  Do not take Amaryl and metformin today.  Check your blood glucose level in the evening and if it is good then okay to take Lantus.  Follow-up with your primary care provider in 1 week.  Do not use illicit substances.   Increase activity slowly   Complete by: As directed       Allergies as of 07/22/2022       Reactions   Penicillins Shortness Of Breath, Other (See Comments)   Caused yeast infection Has patient had a PCN reaction causing immediate rash, facial/tongue/throat swelling, SOB or lightheadedness with hypotension: Yes Has patient had a PCN reaction causing severe rash involving mucus membranes or skin necrosis: No Has patient had a PCN reaction that required hospitalization pt was in the hospital at the time of the reaction Has patient had a PCN reaction occurring within the last 10 years: No If all of the above answers are "NO", then may proceed with Cephalosp   Ultram [tramadol] Other (See Comments)   Made her tongue raw        Medication List     TAKE these medications    albuterol 108 (90 Base) MCG/ACT  inhaler Commonly known as: VENTOLIN HFA Inhale 2 puffs into the lungs every 4 (four) hours as needed for wheezing or shortness of breath.   amLODipine 10 MG tablet Commonly known as: NORVASC Take 1 tablet (10 mg total) by mouth daily.   Aspirin Low Dose 81 MG tablet Generic drug: aspirin EC Take 1 tablet (81 mg total) by mouth daily. Swallow whole.   atorvastatin 80 MG tablet Commonly known as: LIPITOR Take 1 tablet (80 mg total) by mouth daily.   budesonide-formoterol 160-4.5 MCG/ACT inhaler Commonly known as: SYMBICORT Inhale 2 puffs into the lungs every morning.   cetirizine 10 MG tablet Commonly known as: ZYRTEC Take 10 mg by mouth daily as needed for allergies.   clopidogrel 75 MG tablet Commonly known as: PLAVIX Take 1 tablet (75 mg total) by mouth daily.   ergocalciferol 1.25 MG (50000 UT) capsule Commonly known as: VITAMIN D2 Take 1 capsule (50,000 Units total) by mouth once a week. Wednesday   gabapentin 100 MG capsule Commonly known as: NEURONTIN Take 2 capsules (200 mg total) by mouth 2 (two) times daily.   glimepiride 2 MG tablet Commonly known as: AMARYL Take 2 tablets (4 mg total) by mouth 2 (two) times daily.   hydrALAZINE 25 MG tablet Commonly known as: APRESOLINE Take 50 mg by mouth 3 (three) times daily.   hydrALAZINE 50 MG tablet Commonly known as: APRESOLINE Take 1 tablet (50 mg total) by mouth 3 (three) times daily.   hydrOXYzine 50 MG tablet Commonly known as: ATARAX Take 50 mg by mouth at bedtime.   insulin glargine 100 UNIT/ML Solostar Pen Commonly known as: LANTUS Inject 5 Units into the skin at bedtime.   isosorbide mononitrate 30 MG 24 hr tablet Commonly known as: IMDUR Take 1/2 tablet (15 mg total) by mouth daily.   metFORMIN 500 MG tablet Commonly known as: GLUCOPHAGE Take 500 mg by mouth 2 (two) times daily.   Nebivolol HCl 20 MG Tabs Take 1 tablet (20 mg total) by mouth every morning.   nitroGLYCERIN 0.4 MG SL  tablet Commonly known as: NITROSTAT Place 1 tablet (0.4 mg total) under the tongue every 5 (five) minutes as needed for chest pain.   pantoprazole 40 MG tablet Commonly known as: PROTONIX Take 1 tablet (40 mg total) by mouth daily at 12 noon.  Pen Needles 32G X 4 MM Misc 1 Units by Does not apply route daily.   potassium chloride 10 MEQ tablet Commonly known as: KLOR-CON M Take 1 tablet (10 mEq total) by mouth daily.   QUEtiapine 300 MG 24 hr tablet Commonly known as: SEROQUEL XR Take 1 tablet (300 mg total) by mouth at bedtime.   sertraline 50 MG tablet Commonly known as: ZOLOFT Take 1 tablet (50 mg total) by mouth every evening.   torsemide 20 MG tablet Commonly known as: DEMADEX Take 1 tablet (20 mg total) by mouth daily.        Follow-up Information     Simona Huh, NP Follow up in 1 week(s).   Specialty: Nurse Practitioner Contact information: [place] [id number] [phone]                 Time coordinating discharge: 39 minutes  Signed:  Herndon Grill  [facility] 07/22/2022, 2:23 PM

## 2022-07-22 NOTE — Assessment & Plan Note (Signed)
F/u on med rec to see if still on zoloft and seroquel

## 2022-07-22 NOTE — Care Management (Signed)
[facility] resources attached to AVS/DC instructions

## 2022-07-22 NOTE — Assessment & Plan Note (Addendum)
61 year old presenting to ED with persistent hypoglycemia in setting of recent cocaine use. States she thinks she took 20 units of lantus; however, med rec states 5 units. Waiting for med rec update to verify home dosing.  -obs to telemetry  -BS has dropped multiple times despite d50 and D10 IVF x 8ml.  -hold insulin/sulfonylureas -encourage PO intake -CBG q 2 hours -CBG stable x 2, continue to monitor. If drops will start D10 infusion  -A1C in 04/2022 was 6.5  -f/u on med rec to see correct insulin she should be taking

## 2022-07-22 NOTE — Assessment & Plan Note (Signed)
Has been decently controlled Continue home medication when med rec completed

## 2022-07-25 ENCOUNTER — Other Ambulatory Visit (HOSPITAL_COMMUNITY): Payer: Self-pay | Admitting: Emergency Medicine

## 2022-07-25 NOTE — Progress Notes (Addendum)
This was a  med drop off only.  I had a home visit scheduled with her today and she was not home.  I spoke to her by phone and she advised me to leave her meds behind the screen on her front door.  I advised her I that I didn't feel comfortable doing this and she stated that Ivy Lynn does it all the time.  I advised her the only way I would agree to do this would be if she text me to let me know she got her meds which she did.    [name], [place] 07/25/2022

## 2022-07-27 ENCOUNTER — Telehealth (HOSPITAL_COMMUNITY): Payer: Self-pay | Admitting: Emergency Medicine

## 2022-07-27 NOTE — Telephone Encounter (Signed)
LVM for Ms. Morash to call me back.  She was no-show for yesterdays home visit and I wanted to come by today to do visit.    [name], [place] 07/27/2022

## 2022-08-01 ENCOUNTER — Other Ambulatory Visit (HOSPITAL_COMMUNITY): Payer: Self-pay | Admitting: *Deleted

## 2022-08-01 MED ORDER — AMLODIPINE BESYLATE 10 MG PO TABS: 10.0000 mg | ORAL_TABLET | Freq: Every day | ORAL | 3 refills | Status: DC

## 2022-08-01 MED ORDER — HYDRALAZINE HCL 25 MG PO TABS: 50.0000 mg | ORAL_TABLET | Freq: Three times a day (TID) | ORAL | 3 refills | Status: DC

## 2022-08-01 MED ORDER — ISOSORBIDE MONONITRATE ER 30 MG PO TB24: 15.0000 mg | ORAL_TABLET | Freq: Every day | ORAL | 3 refills | Status: DC

## 2022-08-02 ENCOUNTER — Telehealth (HOSPITAL_COMMUNITY): Payer: Self-pay | Admitting: Emergency Medicine

## 2022-08-02 NOTE — Telephone Encounter (Signed)
Called LVM for her to call me back.  Need to do home visit today. Also called yesterday and left message w/ no return call.    [name], [place] 08/02/2022

## 2022-08-06 ENCOUNTER — Encounter (HOSPITAL_COMMUNITY): Payer: Medicare Other

## 2022-08-07 ENCOUNTER — Telehealth (HOSPITAL_COMMUNITY): Payer: Self-pay | Admitting: Emergency Medicine

## 2022-08-07 NOTE — Telephone Encounter (Signed)
Called w/ no answer.  LVM for her to call me back @ [phone] to do a home visit.      [name], [place] 08/07/2022

## 2022-08-08 ENCOUNTER — Telehealth (HOSPITAL_COMMUNITY): Payer: Self-pay | Admitting: Emergency Medicine

## 2022-08-08 NOTE — Telephone Encounter (Signed)
Called with no answer.  LVM for her to call me back to schedule visit.    [name], [place] 08/08/2022

## 2022-08-16 ENCOUNTER — Other Ambulatory Visit (HOSPITAL_COMMUNITY): Payer: Self-pay | Admitting: Emergency Medicine

## 2022-08-16 NOTE — Progress Notes (Signed)
Paramedicine Encounter    Patient ID: Carol [name], female    DOB: June 11, 1961, 61 y.o.   MRN: [id number]   BP (!) 160/90 (BP Location: Left Arm, Patient Position: Sitting, Cuff Size: Normal)   Pulse (!) 112   Resp 18   Wt 186 lb 12.8 oz (84.7 kg)   SpO2 99%   BMI 32.06 kg/m  Weight yesterday-not taken Last visit weight-187.9lb  First home visit w/ pt since 11/30.  I did drop off a reconciled pill box for her 07/25/22 but she wasn't home at that time.  So admits to being non-compliant with her meds for at least 2 weeks.  Her blood pressure and heart rate is elevated.  She denies chest pain or SOB and no edema noted. Med box reconciled x 2 weeks but I advised her that I would come next Thursday afternoon for another visit and she agreed to same.  She denies any recent recreation drug use in the last few days.  I will reach out to the clinic to advise them of her vitals and see if they want her to schedule another visit soon.  Home visit complete.    [name], [place] 08/16/2022   Patient Care Team: Simona Huh, NP as PCP - General (Nurse Practitioner) Janina Mayo, MD as PCP - Cardiology (Cardiology)  Patient Active Problem List   Diagnosis Date Noted   Acute exacerbation of CHF (congestive heart failure) ([place]) 05/09/2022   High anion gap metabolic acidosis 50/10/7046   Chest pain, atypical 05/09/2022   Acute on chronic diastolic (congestive) heart failure ([place]) 03/22/2022   COPD not affecting current episode of care 03/22/2022   Class 2 obesity due to excess calories with body mass index (BMI) of 37.0 to 37.9 in adult 03/22/2022   CHF (congestive heart failure) ([place]) 03/14/2022   CKD (chronic kidney disease), stage IV ([place]) 03/10/2022   Hypertensive urgency 01/22/2022   Acute kidney injury superimposed on chronic kidney disease ([place]) 01/22/2022   Acute on chronic diastolic CHF (congestive heart failure) ([place]) 01/22/2022   Prolonged QT interval  01/22/2022   Elevated troponin 01/22/2022   Thyroid nodule 01/22/2022   chest pain/shortness of breath in setting of Cocaine abuse-now resolved 01/02/2022   Diabetic hypoglycemia ([place]) 12/31/2021   CKD (chronic kidney disease) stage 4, GFR 15-29 ml/min (HCC) 12/31/2021   CAD in native artery 12/24/2021   Anxiety with depression 12/24/2021   Pure hypercholesterolemia    Colon cancer screening 02/18/2014   Diabetes mellitus ([place]) 01/29/2014   Essential hypertension 01/29/2014   Closed jaw fracture ([place]) 01/29/2014   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    Current Outpatient Medications:    albuterol (PROVENTIL HFA;VENTOLIN HFA) 108 (90 Base) MCG/ACT inhaler, Inhale 2 puffs into the lungs every 4 (four) hours as needed for wheezing or shortness of breath., Disp: , Rfl:    amLODipine (NORVASC) 10 MG tablet, Take 1 tablet (10 mg total) by mouth daily., Disp: 30 tablet, Rfl: 3   aspirin EC 81 MG tablet, Take 1 tablet (81 mg total) by mouth daily. Swallow whole., Disp: 30 tablet, Rfl: 0   atorvastatin (LIPITOR) 80 MG tablet, Take 1 tablet (80 mg total) by mouth daily., Disp: 30 tablet, Rfl: 0   budesonide-formoterol (SYMBICORT) 160-4.5 MCG/ACT inhaler, Inhale 2 puffs into the lungs every morning., Disp: , Rfl:    cetirizine (ZYRTEC) 10 MG tablet, Take 10 mg by mouth daily as needed for allergies., Disp: , Rfl:  clopidogrel (PLAVIX) 75 MG tablet, Take 1 tablet (75 mg total) by mouth daily., Disp: 30 tablet, Rfl: 0   ergocalciferol (VITAMIN D2) 1.25 MG (50000 UT) capsule, Take 1 capsule (50,000 Units total) by mouth once a week. Wednesday, Disp: 30 capsule, Rfl: 0   hydrALAZINE (APRESOLINE) 25 MG tablet, Take 2 tablets (50 mg total) by mouth 3 (three) times daily., Disp: 360 tablet, Rfl: 3   hydrOXYzine (ATARAX) 50 MG tablet, Take 50 mg by mouth at bedtime., Disp: , Rfl:    insulin glargine (LANTUS) 100 UNIT/ML Solostar Pen, Inject 5 Units into the skin at bedtime., Disp: 15 mL,  Rfl: 0   Insulin Pen Needle (PEN NEEDLES) 32G X 4 MM MISC, 1 Units by Does not apply route daily., Disp: 100 each, Rfl: 0   isosorbide mononitrate (IMDUR) 30 MG 24 hr tablet, Take 1/2 tablet (15 mg total) by mouth daily., Disp: 15 tablet, Rfl: 3   metFORMIN (GLUCOPHAGE) 500 MG tablet, Take 500 mg by mouth 2 (two) times daily., Disp: , Rfl:    Nebivolol HCl 20 MG TABS, Take 1 tablet (20 mg total) by mouth every morning., Disp: 30 tablet, Rfl: 0   pantoprazole (PROTONIX) 40 MG tablet, Take 1 tablet (40 mg total) by mouth daily at 12 noon., Disp: 30 tablet, Rfl: 0   potassium chloride SA (KLOR-CON M) 10 MEQ tablet, Take 1 tablet (10 mEq total) by mouth daily., Disp: 30 tablet, Rfl: 3   torsemide (DEMADEX) 20 MG tablet, Take 1 tablet (20 mg total) by mouth daily., Disp: 90 tablet, Rfl: 3   gabapentin (NEURONTIN) 100 MG capsule, Take 2 capsules (200 mg total) by mouth 2 (two) times daily., Disp: 60 capsule, Rfl: 0   glimepiride (AMARYL) 2 MG tablet, Take 2 tablets (4 mg total) by mouth 2 (two) times daily., Disp: 120 tablet, Rfl: 0   hydrALAZINE (APRESOLINE) 50 MG tablet, Take 1 tablet (50 mg total) by mouth 3 (three) times daily. (Patient not taking: Reported on 07/22/2022), Disp: 270 tablet, Rfl: 3   nitroGLYCERIN (NITROSTAT) 0.4 MG SL tablet, Place 1 tablet (0.4 mg total) under the tongue every 5 (five) minutes as needed for chest pain., Disp: 30 tablet, Rfl: 0   QUEtiapine (SEROQUEL XR) 300 MG 24 hr tablet, Take 1 tablet (300 mg total) by mouth at bedtime., Disp: 30 tablet, Rfl: 0   sertraline (ZOLOFT) 50 MG tablet, Take 1 tablet (50 mg total) by mouth every evening., Disp: 30 tablet, Rfl: 0 Allergies  Allergen Reactions   Penicillins Shortness Of Breath and Other (See Comments)    Caused yeast infection Has patient had a PCN reaction causing immediate rash, facial/tongue/throat swelling, SOB or lightheadedness with hypotension: Yes Has patient had a PCN reaction causing severe rash involving mucus  membranes or skin necrosis: No Has patient had a PCN reaction that required hospitalization pt was in the hospital at the time of the reaction Has patient had a PCN reaction occurring within the last 10 years: No If all of the above answers are "NO", then may proceed with Cephalosp   Ultram [Tramadol] Other (See Comments)    Made her tongue raw      Social History   Socioeconomic History   Marital status: Single    Spouse name: Not on file   Number of children: 3   Years of education: Not on file   Highest education level: Not on file  Occupational History   Not on file  Tobacco Use  Smoking status: Some Days    Packs/day: 0.25    Years: 3.00    Total pack years: 0.75    Types: Cigarettes   Smokeless tobacco: Never  Vaping Use   Vaping Use: Never used  Substance and Sexual Activity   Alcohol use: Yes    Alcohol/week: 0.0 standard drinks of alcohol    Comment: occasional   Drug use: No    Comment: none for 13 years   Sexual activity: Not on file  Other Topics Concern   Not on file  Social History Narrative   Not on file   Social Determinants of Health   Financial Resource Strain: Low Risk  (04/04/2022)   Overall Financial Resource Strain (CARDIA)    Difficulty of Paying Living Expenses: Not very hard  Food Insecurity: No Food Insecurity (04/04/2022)   Hunger Vital Sign    Worried About Running Out of Food in the Last Year: Never true    Ran Out of Food in the Last Year: Never true  Transportation Needs: No Transportation Needs (04/04/2022)   PRAPARE - Hydrologist (Medical): No    Lack of Transportation (Non-Medical): No  Physical Activity: Not on file  Stress: Not on file  Social Connections: Not on file  Intimate Partner Violence: Not on file    Physical Exam      Future Appointments  Date Time Provider [place]  09/19/2022  3:00 PM [facility] 3 [facility]  01/11/2023  9:50 AM Shamleffer, Melanie Crazier,  MD [facility] None       [name], [facility] [place] Paramedic  08/16/22

## 2022-08-23 ENCOUNTER — Other Ambulatory Visit (HOSPITAL_COMMUNITY): Payer: Self-pay | Admitting: Emergency Medicine

## 2022-08-23 NOTE — Progress Notes (Signed)
  Pt requested to cancel appointment via text message scheduled for today @ 1:00.  She advised she would contact me to reschedule.  I will reach out again to her tomorrow.    [name], [place] 08/23/2022

## 2022-08-29 ENCOUNTER — Telehealth (HOSPITAL_COMMUNITY): Payer: Self-pay | Admitting: Emergency Medicine

## 2022-08-29 NOTE — Telephone Encounter (Signed)
Called @ 4:13 w/ no answer.  LVM regarding trying to schedule home visit.    [name], [place] 08/29/2022

## 2022-09-07 ENCOUNTER — Telehealth (HOSPITAL_COMMUNITY): Payer: Self-pay | Admitting: Emergency Medicine

## 2022-09-07 NOTE — Telephone Encounter (Signed)
Called to attempt schedule home visit.  No answer unable to LVM    [name], [place] 09/07/2022

## 2022-09-14 ENCOUNTER — Other Ambulatory Visit (HOSPITAL_COMMUNITY): Payer: Self-pay | Admitting: Emergency Medicine

## 2022-09-14 NOTE — Progress Notes (Signed)
Paramedicine Encounter    Patient ID: Carol [name], female    DOB: 05-11-1961, 62 y.o.   MRN: [id number]   There were no vitals taken for this visit. Weight yesterday-not taken Last visit weight-***  Patient Care Team: Simona Huh, NP as PCP - General (Nurse Practitioner) Janina Mayo, MD as PCP - Cardiology (Cardiology)  Patient Active Problem List   Diagnosis Date Noted  . Acute exacerbation of CHF (congestive heart failure) ([place]) 05/09/2022  . High anion gap metabolic acidosis 08/28/3233  . Chest pain, atypical 05/09/2022  . Acute on chronic diastolic (congestive) heart failure ([place]) 03/22/2022  . COPD not affecting current episode of care 03/22/2022  . Class 2 obesity due to excess calories with body mass index (BMI) of 37.0 to 37.9 in adult 03/22/2022  . CHF (congestive heart failure) ([place]) 03/14/2022  . CKD (chronic kidney disease), stage IV ([place]) 03/10/2022  . Hypertensive urgency 01/22/2022  . Acute kidney injury superimposed on chronic kidney disease ([place]) 01/22/2022  . Acute on chronic diastolic CHF (congestive heart failure) ([place]) 01/22/2022  . Prolonged QT interval 01/22/2022  . Elevated troponin 01/22/2022  . Thyroid nodule 01/22/2022  . chest pain/shortness of breath in setting of Cocaine abuse-now resolved 01/02/2022  . Diabetic hypoglycemia ([place]) 12/31/2021  . CKD (chronic kidney disease) stage 4, GFR 15-29 ml/min (HCC) 12/31/2021  . CAD in native artery 12/24/2021  . Anxiety with depression 12/24/2021  . Pure hypercholesterolemia   . Colon cancer screening 02/18/2014  . Diabetes mellitus ([place]) 01/29/2014  . Essential hypertension 01/29/2014  . Closed jaw fracture ([place]) 01/29/2014  . 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    Current Outpatient Medications:  .  albuterol (PROVENTIL HFA;VENTOLIN HFA) 108 (90 Base) MCG/ACT inhaler, Inhale 2 puffs into the lungs every 4 (four) hours as needed for wheezing or shortness of breath., Disp:  , Rfl:  .  amLODipine (NORVASC) 10 MG tablet, Take 1 tablet (10 mg total) by mouth daily., Disp: 30 tablet, Rfl: 3 .  aspirin EC 81 MG tablet, Take 1 tablet (81 mg total) by mouth daily. Swallow whole., Disp: 30 tablet, Rfl: 0 .  atorvastatin (LIPITOR) 80 MG tablet, Take 1 tablet (80 mg total) by mouth daily., Disp: 30 tablet, Rfl: 0 .  budesonide-formoterol (SYMBICORT) 160-4.5 MCG/ACT inhaler, Inhale 2 puffs into the lungs every morning., Disp: , Rfl:  .  cetirizine (ZYRTEC) 10 MG tablet, Take 10 mg by mouth daily as needed for allergies., Disp: , Rfl:  .  clopidogrel (PLAVIX) 75 MG tablet, Take 1 tablet (75 mg total) by mouth daily., Disp: 30 tablet, Rfl: 0 .  ergocalciferol (VITAMIN D2) 1.25 MG (50000 UT) capsule, Take 1 capsule (50,000 Units total) by mouth once a week. Wednesday, Disp: 30 capsule, Rfl: 0 .  gabapentin (NEURONTIN) 100 MG capsule, Take 2 capsules (200 mg total) by mouth 2 (two) times daily., Disp: 60 capsule, Rfl: 0 .  glimepiride (AMARYL) 2 MG tablet, Take 2 tablets (4 mg total) by mouth 2 (two) times daily., Disp: 120 tablet, Rfl: 0 .  hydrALAZINE (APRESOLINE) 25 MG tablet, Take 2 tablets (50 mg total) by mouth 3 (three) times daily., Disp: 360 tablet, Rfl: 3 .  hydrALAZINE (APRESOLINE) 50 MG tablet, Take 1 tablet (50 mg total) by mouth 3 (three) times daily. (Patient not taking: Reported on 07/22/2022), Disp: 270 tablet, Rfl: 3 .  hydrOXYzine (ATARAX) 50 MG tablet, Take 50 mg by mouth at bedtime., Disp: , Rfl:  .  insulin glargine (  LANTUS) 100 UNIT/ML Solostar Pen, Inject 5 Units into the skin at bedtime., Disp: 15 mL, Rfl: 0 .  Insulin Pen Needle (PEN NEEDLES) 32G X 4 MM MISC, 1 Units by Does not apply route daily., Disp: 100 each, Rfl: 0 .  isosorbide mononitrate (IMDUR) 30 MG 24 hr tablet, Take 1/2 tablet (15 mg total) by mouth daily., Disp: 15 tablet, Rfl: 3 .  metFORMIN (GLUCOPHAGE) 500 MG tablet, Take 500 mg by mouth 2 (two) times daily., Disp: , Rfl:  .  Nebivolol HCl 20  MG TABS, Take 1 tablet (20 mg total) by mouth every morning., Disp: 30 tablet, Rfl: 0 .  nitroGLYCERIN (NITROSTAT) 0.4 MG SL tablet, Place 1 tablet (0.4 mg total) under the tongue every 5 (five) minutes as needed for chest pain., Disp: 30 tablet, Rfl: 0 .  pantoprazole (PROTONIX) 40 MG tablet, Take 1 tablet (40 mg total) by mouth daily at 12 noon., Disp: 30 tablet, Rfl: 0 .  potassium chloride SA (KLOR-CON M) 10 MEQ tablet, Take 1 tablet (10 mEq total) by mouth daily., Disp: 30 tablet, Rfl: 3 .  QUEtiapine (SEROQUEL XR) 300 MG 24 hr tablet, Take 1 tablet (300 mg total) by mouth at bedtime., Disp: 30 tablet, Rfl: 0 .  sertraline (ZOLOFT) 50 MG tablet, Take 1 tablet (50 mg total) by mouth every evening., Disp: 30 tablet, Rfl: 0 .  torsemide (DEMADEX) 20 MG tablet, Take 1 tablet (20 mg total) by mouth daily., Disp: 90 tablet, Rfl: 3 Allergies  Allergen Reactions  . Penicillins Shortness Of Breath and Other (See Comments)    Caused yeast infection Has patient had a PCN reaction causing immediate rash, facial/tongue/throat swelling, SOB or lightheadedness with hypotension: Yes Has patient had a PCN reaction causing severe rash involving mucus membranes or skin necrosis: No Has patient had a PCN reaction that required hospitalization pt was in the hospital at the time of the reaction Has patient had a PCN reaction occurring within the last 10 years: No If all of the above answers are "NO", then may proceed with Cephalosp  . Ultram [Tramadol] Other (See Comments)    Made her tongue raw      Social History   Socioeconomic History  . Marital status: Single    Spouse name: Not on file  . Number of children: 3  . Years of education: Not on file  . Highest education level: Not on file  Occupational History  . Not on file  Tobacco Use  . Smoking status: Some Days    Packs/day: 0.25    Years: 3.00    Total pack years: 0.75    Types: Cigarettes  . Smokeless tobacco: Never  Vaping Use  .  Vaping Use: Never used  Substance and Sexual Activity  . Alcohol use: Yes    Alcohol/week: 0.0 standard drinks of alcohol    Comment: occasional  . Drug use: No    Comment: none for 13 years  . Sexual activity: Not on file  Other Topics Concern  . Not on file  Social History Narrative  . Not on file   Social Determinants of Health   Financial Resource Strain: Low Risk  (04/04/2022)   Overall Financial Resource Strain (CARDIA)   . Difficulty of Paying Living Expenses: Not very hard  Food Insecurity: No Food Insecurity (04/04/2022)   Hunger Vital Sign   . Worried About Charity fundraiser in the Last Year: Never true   . Ran Out of Food in  the Last Year: Never true  Transportation Needs: No Transportation Needs (04/04/2022)   PRAPARE - Transportation   . Lack of Transportation (Medical): No   . Lack of Transportation (Non-Medical): No  Physical Activity: Not on file  Stress: Not on file  Social Connections: Not on file  Intimate Partner Violence: Not on file    Physical Exam      Future Appointments  Date Time Provider [place]  09/19/2022  3:00 PM [facility] MM 3 [facility]  01/11/2023  9:50 AM Shamleffer, Melanie Crazier, MD [facility] None       [name], [place] [facility] Paramedic  09/14/22

## 2022-09-19 ENCOUNTER — Ambulatory Visit: Payer: 59

## 2022-10-01 ENCOUNTER — Other Ambulatory Visit: Payer: Self-pay | Admitting: Student

## 2022-10-12 ENCOUNTER — Other Ambulatory Visit (HOSPITAL_COMMUNITY): Payer: Self-pay | Admitting: Emergency Medicine

## 2022-10-12 ENCOUNTER — Telehealth (HOSPITAL_COMMUNITY): Payer: Self-pay | Admitting: Emergency Medicine

## 2022-10-12 NOTE — Progress Notes (Signed)
Patient is now discharged from [facility].  Patient has/has not met the following goals:  Yes :Patient expresses basic understanding of medications and what they are for Yes :Patient able to verbalize heart failure specific dietary/fluid restrictions Yes :Patient is aware of who to call if they have medical concerns or if they need to schedule or change appts No :Patient has a scale for daily weights and weighs regularly Yes :Patient able to verbalize concerning symptoms when they should call the HF clinic (weight gain ranges, etc) No :Patient has a PCP and has seen within the past year or has upcoming appt Yes :Patient has reliable access to getting their medications No :Patient has shown they are able to reorder medications reliably No :Patient has had admission in past 30 days- if yes how many? Yes :Patient has had admission in past 90 days- if yes how many?  Discharge Comments: Ms. Jacobi has been very difficult to communicate with being often unable to reach by phone or text and does not return calls.  She is not compliant with her meds.  She has had some hospitalizations related to SOB secondary to drug use.  My last contact with her raised a safety concern to me as it was obvious both she and her boyfriend appeared to be under the influence of possible drugs.  Erratic behaviour, excessive rambling conversation.  I feel it is best to discharge her at this time.      [name], [place] 10/12/2022

## 2022-10-12 NOTE — Telephone Encounter (Signed)
No answer and unable to leave voicemail.  Has been difficult to maintain communications w/.  Will discharge due to non-compliance.    [name], [place] 10/12/2022

## 2022-10-16 ENCOUNTER — Other Ambulatory Visit (HOSPITAL_COMMUNITY): Payer: Self-pay | Admitting: Emergency Medicine

## 2022-10-16 NOTE — Progress Notes (Signed)
Paramedicine Encounter    Patient ID: Carol [name], female    DOB: May 12, 1961, 62 y.o.   MRN: [id number]   Complaints Chest tightness, exertional SOB  Assessment Pt. Admits to using crack cocaine last night.  She is tearful and emotional  Compliance with meds No  Pill box filled x 1 week  Refills needed NONE  Meds changes since last visit NONE    Social changes Increased drug use   BP (!) 180/88 (BP Location: Left Arm, Patient Position: Sitting, Cuff Size: Normal)   Pulse 100   Resp 18   Wt 196 lb 3.2 oz (89 kg)   SpO2 97%   BMI 33.68 kg/m  Weight yesterday-not taken Last visit weight-190lb  Carol [name] had been discharged from paramedicine due to non-compliance.  She reached out to me today requesting assistance with her medications.  On my arrival she was A&O x 4, skin W&D w/ good color.  She appeared someewhat anxious and admits to smoking crack cocaine starting about 5:30 last night to 4:00 a.m. this morning.  She was tearfully emotional about her rug use and says, "I know I messed up."  She says she needs to get away from her boyfriend as she says he encourages her drug use.  She denies feeling unsafe in her home.  We discussed deox and/or rehab.  She says she has a sponsor "Luellen Pucker" that she's been communicating with.   She complains of chest tightness with increased SOB on exertion.  I recommended she seek further evaluation at the emergency room based on her complaints and she advised she did not want to be transported at this time. I initiated an EMS ticket to document pt presentation, signs symptoms and refusal.   Pt was sinus rhythm - 12 lead confirmed sinus rhythm.   I advised her she was accepting responsibility for her decision not to be transported at this time.  I encouraged her to contact 911 should she change her mind or have further needs.  ACTION: Home visit completed  Skipper Cliche [id number] 10/16/22  Patient Care Team: Simona Huh, NP as PCP - General (Nurse Practitioner) Janina Mayo, MD as PCP - Cardiology (Cardiology)  Patient Active Problem List   Diagnosis Date Noted   Acute exacerbation of CHF (congestive heart failure) ([place]) 05/09/2022   High anion gap metabolic acidosis 123456   Chest pain, atypical 05/09/2022   Acute on chronic diastolic (congestive) heart failure ([place]) 03/22/2022   COPD not affecting current episode of care 03/22/2022   Class 2 obesity due to excess calories with body mass index (BMI) of 37.0 to 37.9 in adult 03/22/2022   CHF (congestive heart failure) ([place]) 03/14/2022   CKD (chronic kidney disease), stage IV ([place]) 03/10/2022   Hypertensive urgency 01/22/2022   Acute kidney injury superimposed on chronic kidney disease ([place]) 01/22/2022   Acute on chronic diastolic CHF (congestive heart failure) ([place]) 01/22/2022   Prolonged QT interval 01/22/2022   Elevated troponin 01/22/2022   Thyroid nodule 01/22/2022   chest pain/shortness of breath in setting of Cocaine abuse-now resolved 01/02/2022   Diabetic hypoglycemia ([place]) 12/31/2021   CKD (chronic kidney disease) stage 4, GFR 15-29 ml/min ([place]) 12/31/2021   CAD in native artery 12/24/2021   Anxiety with depression 12/24/2021   Pure hypercholesterolemia    Colon cancer screening 02/18/2014   Diabetes mellitus ([place]) 01/29/2014   Essential hypertension 01/29/2014   Closed jaw fracture ([place]) 01/29/2014   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  Current Outpatient Medications:    albuterol (PROVENTIL HFA;VENTOLIN HFA) 108 (90 Base) MCG/ACT inhaler, Inhale 2 puffs into the lungs every 4 (four) hours as needed for wheezing or shortness of breath., Disp: , Rfl:    amLODipine (NORVASC) 10 MG tablet, Take 1 tablet (10 mg total) by mouth daily., Disp: 30 tablet, Rfl: 3   aspirin EC 81 MG tablet, Take 1 tablet (81 mg total) by mouth daily. Swallow whole., Disp: 30 tablet, Rfl: 0   atorvastatin (LIPITOR) 80 MG tablet, Take  1 tablet (80 mg total) by mouth daily., Disp: 30 tablet, Rfl: 0   budesonide-formoterol (SYMBICORT) 160-4.5 MCG/ACT inhaler, Inhale 2 puffs into the lungs every morning., Disp: , Rfl:    cetirizine (ZYRTEC) 10 MG tablet, Take 10 mg by mouth daily as needed for allergies., Disp: , Rfl:    clopidogrel (PLAVIX) 75 MG tablet, Take 1 tablet (75 mg total) by mouth daily., Disp: 30 tablet, Rfl: 0   hydrALAZINE (APRESOLINE) 25 MG tablet, Take 2 tablets (50 mg total) by mouth 3 (three) times daily., Disp: 360 tablet, Rfl: 3   hydrALAZINE (APRESOLINE) 50 MG tablet, Take 1 tablet (50 mg total) by mouth 3 (three) times daily., Disp: 270 tablet, Rfl: 3   insulin glargine (LANTUS) 100 UNIT/ML Solostar Pen, Inject 5 Units into the skin at bedtime., Disp: 15 mL, Rfl: 0   Insulin Pen Needle (PEN NEEDLES) 32G X 4 MM MISC, 1 Units by Does not apply route daily., Disp: 100 each, Rfl: 0   isosorbide mononitrate (IMDUR) 30 MG 24 hr tablet, Take 1/2 tablet (15 mg total) by mouth daily., Disp: 15 tablet, Rfl: 3   metFORMIN (GLUCOPHAGE) 500 MG tablet, Take 500 mg by mouth 2 (two) times daily., Disp: , Rfl:    Nebivolol HCl 20 MG TABS, Take 1 tablet (20 mg total) by mouth every morning., Disp: 30 tablet, Rfl: 0   pantoprazole (PROTONIX) 40 MG tablet, Take 1 tablet (40 mg total) by mouth daily at 12 noon., Disp: 30 tablet, Rfl: 0   potassium chloride SA (KLOR-CON M) 10 MEQ tablet, Take 1 tablet (10 mEq total) by mouth daily., Disp: 30 tablet, Rfl: 3   torsemide (DEMADEX) 20 MG tablet, Take 1 tablet (20 mg total) by mouth daily., Disp: 90 tablet, Rfl: 3   ergocalciferol (VITAMIN D2) 1.25 MG (50000 UT) capsule, Take 1 capsule (50,000 Units total) by mouth once a week. Wednesday, Disp: 30 capsule, Rfl: 0   gabapentin (NEURONTIN) 100 MG capsule, Take 2 capsules (200 mg total) by mouth 2 (two) times daily. (Patient not taking: Reported on 09/14/2022), Disp: 60 capsule, Rfl: 0   glimepiride (AMARYL) 2 MG tablet, Take 2 tablets (4 mg  total) by mouth 2 (two) times daily., Disp: 120 tablet, Rfl: 0   hydrOXYzine (ATARAX) 50 MG tablet, Take 50 mg by mouth at bedtime., Disp: , Rfl:    nitroGLYCERIN (NITROSTAT) 0.4 MG SL tablet, Place 1 tablet (0.4 mg total) under the tongue every 5 (five) minutes as needed for chest pain., Disp: 30 tablet, Rfl: 0   QUEtiapine (SEROQUEL XR) 300 MG 24 hr tablet, Take 1 tablet (300 mg total) by mouth at bedtime., Disp: 30 tablet, Rfl: 0   sertraline (ZOLOFT) 50 MG tablet, Take 1 tablet (50 mg total) by mouth every evening., Disp: 30 tablet, Rfl: 0 Allergies  Allergen Reactions   Penicillins Shortness Of Breath and Other (See Comments)    Caused yeast infection Has patient had a PCN reaction causing immediate  rash, facial/tongue/throat swelling, SOB or lightheadedness with hypotension: Yes Has patient had a PCN reaction causing severe rash involving mucus membranes or skin necrosis: No Has patient had a PCN reaction that required hospitalization pt was in the hospital at the time of the reaction Has patient had a PCN reaction occurring within the last 10 years: No If all of the above answers are "NO", then may proceed with Cephalosp   Ultram [Tramadol] Other (See Comments)    Made her tongue raw     Social History   Socioeconomic History   Marital status: Single    Spouse name: Not on file   Number of children: 3   Years of education: Not on file   Highest education level: Not on file  Occupational History   Not on file  Tobacco Use   Smoking status: Some Days    Packs/day: 0.25    Years: 3.00    Total pack years: 0.75    Types: Cigarettes   Smokeless tobacco: Never  Vaping Use   Vaping Use: Never used  Substance and Sexual Activity   Alcohol use: Yes    Alcohol/week: 0.0 standard drinks of alcohol    Comment: occasional   Drug use: No    Comment: none for 13 years   Sexual activity: Not on file  Other Topics Concern   Not on file  Social History Narrative   Not on file    Social Determinants of Health   Financial Resource Strain: Low Risk  (04/04/2022)   Overall Financial Resource Strain (CARDIA)    Difficulty of Paying Living Expenses: Not very hard  Food Insecurity: No Food Insecurity (04/04/2022)   Hunger Vital Sign    Worried About Running Out of Food in the Last Year: Never true    Ran Out of Food in the Last Year: Never true  Transportation Needs: No Transportation Needs (04/04/2022)   PRAPARE - Hydrologist (Medical): No    Lack of Transportation (Non-Medical): No  Physical Activity: Not on file  Stress: Not on file  Social Connections: Not on file  Intimate Partner Violence: Not on file    Physical Exam      Future Appointments  Date Time Provider [place]  11/05/2022  1:30 PM [facility] PA/NP [facility] None  01/11/2023  9:50 AM Shamleffer, Melanie Crazier, MD [facility] None

## 2022-10-31 ENCOUNTER — Other Ambulatory Visit: Payer: Self-pay | Admitting: Student

## 2022-10-31 ENCOUNTER — Other Ambulatory Visit (HOSPITAL_COMMUNITY): Payer: Self-pay | Admitting: Family Medicine

## 2022-11-01 ENCOUNTER — Observation Stay (HOSPITAL_COMMUNITY): Payer: 59

## 2022-11-01 ENCOUNTER — Inpatient Hospital Stay (HOSPITAL_COMMUNITY)
Admission: EM | Admit: 2022-11-01 | Discharge: 2022-11-07 | DRG: 637 | Disposition: A | Discharge status: Home Health | Payer: 59 | Attending: Internal Medicine | Admitting: Internal Medicine

## 2022-11-01 ENCOUNTER — Other Ambulatory Visit: Payer: Self-pay

## 2022-11-01 ENCOUNTER — Encounter (HOSPITAL_COMMUNITY): Payer: Self-pay | Admitting: Family Medicine

## 2022-11-01 DIAGNOSIS — Z7902 Long term (current) use of antithrombotics/antiplatelets: Secondary | ICD-10-CM

## 2022-11-01 DIAGNOSIS — R933 Abnormal findings on diagnostic imaging of other parts of digestive tract: Secondary | ICD-10-CM

## 2022-11-01 DIAGNOSIS — E875 Hyperkalemia: Secondary | ICD-10-CM | POA: Diagnosis present

## 2022-11-01 DIAGNOSIS — Z7982 Long term (current) use of aspirin: Secondary | ICD-10-CM

## 2022-11-01 DIAGNOSIS — E041 Nontoxic single thyroid nodule: Secondary | ICD-10-CM | POA: Diagnosis present

## 2022-11-01 DIAGNOSIS — E162 Hypoglycemia, unspecified: Principal | ICD-10-CM | POA: Diagnosis present

## 2022-11-01 DIAGNOSIS — N184 Chronic kidney disease, stage 4 (severe): Secondary | ICD-10-CM | POA: Diagnosis present

## 2022-11-01 DIAGNOSIS — F419 Anxiety disorder, unspecified: Secondary | ICD-10-CM | POA: Diagnosis present

## 2022-11-01 DIAGNOSIS — Z1211 Encounter for screening for malignant neoplasm of colon: Secondary | ICD-10-CM

## 2022-11-01 DIAGNOSIS — Z833 Family history of diabetes mellitus: Secondary | ICD-10-CM

## 2022-11-01 DIAGNOSIS — I130 Hypertensive heart and chronic kidney disease with heart failure and stage 1 through stage 4 chronic kidney disease, or unspecified chronic kidney disease: Secondary | ICD-10-CM | POA: Diagnosis present

## 2022-11-01 DIAGNOSIS — I2510 Atherosclerotic heart disease of native coronary artery without angina pectoris: Secondary | ICD-10-CM | POA: Diagnosis present

## 2022-11-01 DIAGNOSIS — R195 Other fecal abnormalities: Secondary | ICD-10-CM

## 2022-11-01 DIAGNOSIS — Z794 Long term (current) use of insulin: Secondary | ICD-10-CM

## 2022-11-01 DIAGNOSIS — K224 Dyskinesia of esophagus: Secondary | ICD-10-CM

## 2022-11-01 DIAGNOSIS — Z8249 Family history of ischemic heart disease and other diseases of the circulatory system: Secondary | ICD-10-CM

## 2022-11-01 DIAGNOSIS — K922 Gastrointestinal hemorrhage, unspecified: Secondary | ICD-10-CM | POA: Diagnosis present

## 2022-11-01 DIAGNOSIS — D500 Iron deficiency anemia secondary to blood loss (chronic): Secondary | ICD-10-CM

## 2022-11-01 DIAGNOSIS — R9431 Abnormal electrocardiogram [ECG] [EKG]: Secondary | ICD-10-CM | POA: Diagnosis present

## 2022-11-01 DIAGNOSIS — Z7984 Long term (current) use of oral hypoglycemic drugs: Secondary | ICD-10-CM

## 2022-11-01 DIAGNOSIS — E669 Obesity, unspecified: Secondary | ICD-10-CM | POA: Diagnosis present

## 2022-11-01 DIAGNOSIS — Z79899 Other long term (current) drug therapy: Secondary | ICD-10-CM

## 2022-11-01 DIAGNOSIS — F810 Specific reading disorder: Secondary | ICD-10-CM | POA: Diagnosis present

## 2022-11-01 DIAGNOSIS — G4700 Insomnia, unspecified: Secondary | ICD-10-CM | POA: Diagnosis present

## 2022-11-01 DIAGNOSIS — D8689 Sarcoidosis of other sites: Secondary | ICD-10-CM | POA: Diagnosis present

## 2022-11-01 DIAGNOSIS — F418 Other specified anxiety disorders: Secondary | ICD-10-CM | POA: Diagnosis present

## 2022-11-01 DIAGNOSIS — Z841 Family history of disorders of kidney and ureter: Secondary | ICD-10-CM

## 2022-11-01 DIAGNOSIS — Z6836 Body mass index (BMI) 36.0-36.9, adult: Secondary | ICD-10-CM

## 2022-11-01 DIAGNOSIS — E11649 Type 2 diabetes mellitus with hypoglycemia without coma: Principal | ICD-10-CM | POA: Diagnosis present

## 2022-11-01 DIAGNOSIS — K222 Esophageal obstruction: Secondary | ICD-10-CM

## 2022-11-01 DIAGNOSIS — E1122 Type 2 diabetes mellitus with diabetic chronic kidney disease: Secondary | ICD-10-CM | POA: Diagnosis present

## 2022-11-01 DIAGNOSIS — I5032 Chronic diastolic (congestive) heart failure: Secondary | ICD-10-CM

## 2022-11-01 DIAGNOSIS — F1410 Cocaine abuse, uncomplicated: Secondary | ICD-10-CM | POA: Diagnosis present

## 2022-11-01 DIAGNOSIS — F17210 Nicotine dependence, cigarettes, uncomplicated: Secondary | ICD-10-CM | POA: Diagnosis present

## 2022-11-01 DIAGNOSIS — Z7951 Long term (current) use of inhaled steroids: Secondary | ICD-10-CM

## 2022-11-01 DIAGNOSIS — R1310 Dysphagia, unspecified: Secondary | ICD-10-CM

## 2022-11-01 DIAGNOSIS — G9341 Metabolic encephalopathy: Secondary | ICD-10-CM | POA: Diagnosis present

## 2022-11-01 DIAGNOSIS — Z885 Allergy status to narcotic agent status: Secondary | ICD-10-CM

## 2022-11-01 DIAGNOSIS — F331 Major depressive disorder, recurrent, moderate: Secondary | ICD-10-CM

## 2022-11-01 DIAGNOSIS — I252 Old myocardial infarction: Secondary | ICD-10-CM

## 2022-11-01 DIAGNOSIS — K5909 Other constipation: Secondary | ICD-10-CM | POA: Diagnosis present

## 2022-11-01 DIAGNOSIS — N179 Acute kidney failure, unspecified: Secondary | ICD-10-CM | POA: Diagnosis present

## 2022-11-01 DIAGNOSIS — Z880 Allergy status to penicillin: Secondary | ICD-10-CM

## 2022-11-01 DIAGNOSIS — G20A1 Parkinson's disease without dyskinesia, without mention of fluctuations: Secondary | ICD-10-CM | POA: Diagnosis present

## 2022-11-01 DIAGNOSIS — I10 Essential (primary) hypertension: Secondary | ICD-10-CM | POA: Diagnosis present

## 2022-11-01 DIAGNOSIS — Z820 Family history of epilepsy and other diseases of the nervous system: Secondary | ICD-10-CM

## 2022-11-01 DIAGNOSIS — I5033 Acute on chronic diastolic (congestive) heart failure: Secondary | ICD-10-CM | POA: Diagnosis present

## 2022-11-01 DIAGNOSIS — Z825 Family history of asthma and other chronic lower respiratory diseases: Secondary | ICD-10-CM

## 2022-11-01 DIAGNOSIS — Z823 Family history of stroke: Secondary | ICD-10-CM

## 2022-11-01 DIAGNOSIS — K219 Gastro-esophageal reflux disease without esophagitis: Secondary | ICD-10-CM | POA: Diagnosis present

## 2022-11-01 LAB — VITAMIN B12: Vitamin B-12: 523 pg/mL (ref 180–914)

## 2022-11-01 LAB — BASIC METABOLIC PANEL WITH GFR
Anion gap: 9 (ref 5–15)
BUN: 19 mg/dL (ref 6–20)
CO2: 17 mmol/L — ABNORMAL LOW (ref 22–32)
Calcium: 8.1 mg/dL — ABNORMAL LOW (ref 8.9–10.3)
Chloride: 110 mmol/L (ref 98–111)
Creatinine, Ser: 2.85 mg/dL — ABNORMAL HIGH (ref 0.44–1.00)
GFR, Estimated: 18 mL/min — ABNORMAL LOW (ref >60)
Glucose, Bld: 24 mg/dL — CL (ref 70–99)
Potassium: 3.7 mmol/L (ref 3.5–5.1)
Sodium: 136 mmol/L (ref 135–145)

## 2022-11-01 LAB — CBG MONITORING, ED
Glucose-Capillary: 123 mg/dL — ABNORMAL HIGH (ref 70–99)
Glucose-Capillary: 27 mg/dL — CL (ref 70–99)
Glucose-Capillary: 42 mg/dL — CL (ref 70–99)
Glucose-Capillary: 46 mg/dL — ABNORMAL LOW (ref 70–99)
Glucose-Capillary: 81 mg/dL (ref 70–99)
Glucose-Capillary: 48 mg/dL — ABNORMAL LOW (ref 70–99)
Glucose-Capillary: 77 mg/dL (ref 70–99)
Glucose-Capillary: 122 mg/dL — ABNORMAL HIGH (ref 70–99)
Glucose-Capillary: 115 mg/dL — ABNORMAL HIGH (ref 70–99)
Glucose-Capillary: 42 mg/dL — CL (ref 70–99)
Glucose-Capillary: 59 mg/dL — ABNORMAL LOW (ref 70–99)

## 2022-11-01 LAB — CBC
HCT: 29 % — ABNORMAL LOW (ref 36.0–46.0)
Hemoglobin: 9.6 g/dL — ABNORMAL LOW (ref 12.0–15.0)
MCH: 29.9 pg (ref 26.0–34.0)
MCHC: 33.1 g/dL (ref 30.0–36.0)
MCV: 90.3 fL (ref 80.0–100.0)
Platelets: 186 K/uL (ref 150–400)
RBC: 3.21 MIL/uL — ABNORMAL LOW (ref 3.87–5.11)
RDW: 13.6 % (ref 11.5–15.5)
WBC: 4.8 K/uL (ref 4.0–10.5)
nRBC: 0 % (ref 0.0–0.2)

## 2022-11-01 LAB — URINALYSIS, ROUTINE W REFLEX MICROSCOPIC
Bilirubin Urine: NEGATIVE
Glucose, UA: NEGATIVE mg/dL
Hgb urine dipstick: NEGATIVE
Ketones, ur: NEGATIVE mg/dL
Nitrite: NEGATIVE
Protein, ur: >=300 mg/dL — AB
Specific Gravity, Urine: 1.004 — ABNORMAL LOW (ref 1.005–1.030)
pH: 7 (ref 5.0–8.0)

## 2022-11-01 LAB — RAPID URINE DRUG SCREEN, HOSP PERFORMED
Amphetamines: NOT DETECTED
Barbiturates: NOT DETECTED
Benzodiazepines: NOT DETECTED
Cocaine: POSITIVE — AB
Opiates: NOT DETECTED
Tetrahydrocannabinol: NOT DETECTED

## 2022-11-01 LAB — GLUCOSE, CAPILLARY
Glucose-Capillary: 136 mg/dL — ABNORMAL HIGH (ref 70–99)
Glucose-Capillary: 134 mg/dL — ABNORMAL HIGH (ref 70–99)
Glucose-Capillary: 137 mg/dL — ABNORMAL HIGH (ref 70–99)

## 2022-11-01 LAB — TROPONIN I (HIGH SENSITIVITY)
Troponin I (High Sensitivity): 12 ng/L (ref <18)
Troponin I (High Sensitivity): 14 ng/L (ref <18)

## 2022-11-01 LAB — FERRITIN: Ferritin: 159 ng/mL (ref 11–307)

## 2022-11-01 LAB — HEPATIC FUNCTION PANEL
ALT: 10 U/L (ref 0–44)
AST: 22 U/L (ref 15–41)
Albumin: 2 g/dL — ABNORMAL LOW (ref 3.5–5.0)
Alkaline Phosphatase: 124 U/L (ref 38–126)
Bilirubin, Direct: 0.2 mg/dL (ref 0.0–0.2)
Indirect Bilirubin: 0.6 mg/dL (ref 0.3–0.9)
Total Bilirubin: 0.8 mg/dL (ref 0.3–1.2)
Total Protein: 6.2 g/dL — ABNORMAL LOW (ref 6.5–8.1)

## 2022-11-01 LAB — TSH: TSH: 0.646 u[IU]/mL (ref 0.350–4.500)

## 2022-11-01 LAB — MAGNESIUM: Magnesium: 1.5 mg/dL — ABNORMAL LOW (ref 1.7–2.4)

## 2022-11-01 LAB — IRON AND TIBC
Iron: 76 ug/dL (ref 28–170)
Saturation Ratios: 37 % — ABNORMAL HIGH (ref 10.4–31.8)
TIBC: 203 ug/dL — ABNORMAL LOW (ref 250–450)
UIBC: 127 ug/dL

## 2022-11-01 MED ORDER — HYDROXYZINE HCL 25 MG PO TABS
50.0000 mg | ORAL_TABLET | Freq: Every day | ORAL | Status: DC
  Administered 2022-11-01 – 2022-11-06 (×6): 50 mg via ORAL
  Filled 2022-11-01 (×6): qty 2

## 2022-11-01 MED ORDER — CLOPIDOGREL BISULFATE 75 MG PO TABS
75.0000 mg | ORAL_TABLET | Freq: Every day | ORAL | Status: DC
  Administered 2022-11-01: 75 mg via ORAL
  Filled 2022-11-01: qty 1

## 2022-11-01 MED ORDER — SODIUM CHLORIDE 0.9 % IV SOLN: 250.0000 mL | INTRAVENOUS | Status: DC | PRN

## 2022-11-01 MED ORDER — ENOXAPARIN SODIUM 30 MG/0.3ML IJ SOSY
30.0000 mg | PREFILLED_SYRINGE | INTRAMUSCULAR | Status: DC
  Administered 2022-11-01 – 2022-11-05 (×5): 30 mg via SUBCUTANEOUS
  Filled 2022-11-01 (×5): qty 0.3

## 2022-11-01 MED ORDER — SERTRALINE HCL 50 MG PO TABS: 50.0000 mg | ORAL_TABLET | Freq: Every evening | ORAL | Status: DC

## 2022-11-01 MED ORDER — POTASSIUM CHLORIDE CRYS ER 10 MEQ PO TBCR
10.0000 meq | EXTENDED_RELEASE_TABLET | Freq: Every day | ORAL | Status: DC
  Administered 2022-11-01 – 2022-11-02 (×2): 10 meq via ORAL
  Filled 2022-11-01 (×2): qty 1

## 2022-11-01 MED ORDER — DEXTROSE 10 % IV SOLN: INTRAVENOUS | Status: DC

## 2022-11-01 MED ORDER — ASPIRIN 81 MG PO TBEC
81.0000 mg | DELAYED_RELEASE_TABLET | Freq: Every day | ORAL | Status: DC
  Administered 2022-11-01 – 2022-11-07 (×7): 81 mg via ORAL
  Filled 2022-11-01 (×7): qty 1

## 2022-11-01 MED ORDER — DEXTROSE 50 % IV SOLN
INTRAVENOUS | Status: AC
  Administered 2022-11-01: 50 mL
  Filled 2022-11-01: qty 50

## 2022-11-01 MED ORDER — FLUTICASONE FUROATE-VILANTEROL 200-25 MCG/ACT IN AEPB
1.0000 | INHALATION_SPRAY | Freq: Every day | RESPIRATORY_TRACT | Status: DC
  Administered 2022-11-03 – 2022-11-07 (×4): 1 via RESPIRATORY_TRACT
  Filled 2022-11-01: qty 28

## 2022-11-01 MED ORDER — ISOSORBIDE MONONITRATE ER 30 MG PO TB24
15.0000 mg | ORAL_TABLET | Freq: Every day | ORAL | Status: DC
  Administered 2022-11-01 – 2022-11-07 (×7): 15 mg via ORAL
  Filled 2022-11-01 (×7): qty 1

## 2022-11-01 MED ORDER — SODIUM CHLORIDE 0.9% FLUSH: 3.0000 mL | INTRAVENOUS | Status: DC | PRN

## 2022-11-01 MED ORDER — ACETAMINOPHEN 325 MG PO TABS
650.0000 mg | ORAL_TABLET | Freq: Four times a day (QID) | ORAL | Status: DC | PRN
  Administered 2022-11-01 – 2022-11-06 (×5): 650 mg via ORAL
  Filled 2022-11-01 (×5): qty 2

## 2022-11-01 MED ORDER — QUETIAPINE FUMARATE ER 300 MG PO TB24
300.0000 mg | ORAL_TABLET | Freq: Every day | ORAL | Status: DC
  Administered 2022-11-01: 300 mg via ORAL
  Filled 2022-11-01 (×3): qty 1

## 2022-11-01 MED ORDER — AMLODIPINE BESYLATE 10 MG PO TABS
10.0000 mg | ORAL_TABLET | Freq: Every day | ORAL | Status: DC
  Administered 2022-11-01 – 2022-11-02 (×2): 10 mg via ORAL
  Filled 2022-11-01: qty 2
  Filled 2022-11-01: qty 1

## 2022-11-01 MED ORDER — MAGNESIUM SULFATE 2 GM/50ML IV SOLN
2.0000 g | Freq: Once | INTRAVENOUS | Status: AC
  Administered 2022-11-01: 2 g via INTRAVENOUS
  Filled 2022-11-01: qty 50

## 2022-11-01 MED ORDER — GABAPENTIN 100 MG PO CAPS
100.0000 mg | ORAL_CAPSULE | Freq: Every day | ORAL | Status: DC
  Administered 2022-11-01 – 2022-11-06 (×6): 100 mg via ORAL
  Filled 2022-11-01 (×6): qty 1

## 2022-11-01 MED ORDER — TORSEMIDE 20 MG PO TABS
20.0000 mg | ORAL_TABLET | Freq: Every day | ORAL | Status: DC
  Administered 2022-11-01 – 2022-11-02 (×2): 20 mg via ORAL
  Filled 2022-11-01 (×2): qty 1

## 2022-11-01 MED ORDER — ATORVASTATIN CALCIUM 80 MG PO TABS
80.0000 mg | ORAL_TABLET | Freq: Every day | ORAL | Status: DC
  Administered 2022-11-01 – 2022-11-07 (×7): 80 mg via ORAL
  Filled 2022-11-01 (×4): qty 1
  Filled 2022-11-01: qty 2
  Filled 2022-11-01 (×2): qty 1

## 2022-11-01 MED ORDER — DEXTROSE 50 % IV SOLN
INTRAVENOUS | Status: AC
  Administered 2022-11-01: 25 mL via INTRAVENOUS
  Filled 2022-11-01: qty 50

## 2022-11-01 MED ORDER — DEXTROSE 50 % IV SOLN
25.0000 mL | Freq: Once | INTRAVENOUS | Status: AC
  Administered 2022-11-01: 25 mL via INTRAVENOUS
  Filled 2022-11-01: qty 50

## 2022-11-01 MED ORDER — PANTOPRAZOLE SODIUM 40 MG PO TBEC: 40.0000 mg | DELAYED_RELEASE_TABLET | Freq: Every day | ORAL | Status: DC

## 2022-11-01 MED ORDER — ACETAMINOPHEN 650 MG RE SUPP: 650.0000 mg | Freq: Four times a day (QID) | RECTAL | Status: DC | PRN

## 2022-11-01 MED ORDER — HYDRALAZINE HCL 50 MG PO TABS
50.0000 mg | ORAL_TABLET | Freq: Three times a day (TID) | ORAL | Status: DC
  Administered 2022-11-01 – 2022-11-03 (×6): 50 mg via ORAL
  Filled 2022-11-01 (×2): qty 1
  Filled 2022-11-01: qty 2
  Filled 2022-11-01 (×3): qty 1

## 2022-11-01 MED ORDER — SODIUM CHLORIDE 0.9% FLUSH
3.0000 mL | Freq: Two times a day (BID) | INTRAVENOUS | Status: DC
  Administered 2022-11-01 – 2022-11-07 (×13): 3 mL via INTRAVENOUS

## 2022-11-01 MED ORDER — ALBUTEROL SULFATE (2.5 MG/3ML) 0.083% IN NEBU: 2.5000 mg | INHALATION_SOLUTION | RESPIRATORY_TRACT | Status: DC | PRN

## 2022-11-01 MED ORDER — PANTOPRAZOLE SODIUM 40 MG IV SOLR
40.0000 mg | Freq: Every day | INTRAVENOUS | Status: DC
  Administered 2022-11-01 – 2022-11-07 (×7): 40 mg via INTRAVENOUS
  Filled 2022-11-01 (×7): qty 10

## 2022-11-01 MED ORDER — DEXTROSE 50 % IV SOLN: 25.0000 mL | Freq: Once | INTRAVENOUS | Status: AC

## 2022-11-01 MED ORDER — HYDRALAZINE HCL 20 MG/ML IJ SOLN
10.0000 mg | Freq: Three times a day (TID) | INTRAMUSCULAR | Status: DC | PRN
  Administered 2022-11-06: 10 mg via INTRAVENOUS
  Filled 2022-11-01 (×2): qty 1

## 2022-11-01 NOTE — ED Triage Notes (Addendum)
Chief Complaint  Patient presents with   Hypoglycemia   BP (!) 197/91 (BP Location: Right Arm)   Pulse 79   Temp (!) 97.3 F (36.3 C) (Oral)   Resp 18   SpO2 99%   Pt presents to ED 26 via EMS from home with above complaint.  Per report, family reported pt's blood sugar dropped twice during day and was 45 upon check.  Family wanted to try food/juice with no improvement.  Pt somewhat drowsy; reportedly took her nightly medications including her "sleepy meds."  Unsure if pt took extra doses or not.  Pt given D10 by EMS with glucose recheck of 213.

## 2022-11-01 NOTE — Progress Notes (Addendum)
Inpatient Diabetes Program Recommendations  AACE/ADA: New Consensus Statement on Inpatient Glycemic Control (2015)  Target Ranges:  Prepandial:   less than 140 mg/dL      Peak postprandial:   less than 180 mg/dL (1-2 hours)      Critically ill patients:  140 - 180 mg/dL   Lab Results  Component Value Date   GLUCAP 48 (L) 11/01/2022   HGBA1C 6.5 (H) 05/09/2022    Review of Glycemic Control  Latest Reference Range & Units 11/01/22 04:35 11/01/22 05:02 11/01/22 06:39 11/01/22 06:40 11/01/22 07:26 11/01/22 08:35  Glucose-Capillary 70 - 99 mg/dL 27 (LL) 123 (H) 46 (L) 42 (LL) 81 48 (L)   Diabetes history: DM 2 Outpatient Diabetes medications:  Amaryl 4 mg bid, Lantus 5 units q HS, Metformin 500 mg bid Current orders for Inpatient glycemic control:  D10 at 75 ml/hr  Inpatient Diabetes Program Recommendations:    Note patient brought to ED with hypoglycemia.  Recommend d/c of home Amaryl.  Last A1C was 6.5%.  May do better with oral agent such as Tradjenta that does not carry risk of hypoglycemia?    Will follow.   Addendum:  1400- spoke with patient at bedside regarding hypoglycemia.  She states that she checks her blood sugars at home and is sometime 56 or 46 mg/dL.  I explained that this is a LOW blood sugar- hypoglycemia and is not normal.  We reviewed signs, symptoms and treatment of low blood sugars including 1/2 cup of juice or Regular soda and recheck in 15 minutes.  We discussed that blood sugar < than 70 mg/dL is low.  Will need close follow- up with PCP and I agree with d/c of Amaryl.    Thanks,  Adah Perl, RN, BC-ADM Inpatient Diabetes Coordinator Pager [phone]  (8a-5p)

## 2022-11-01 NOTE — Assessment & Plan Note (Signed)
Extremely elevated, but has not had any medication. Asymptomatic Give home medication now: norvasc '10mg'$  daily, hydralazine '50mg'$  TID, imdur '15mg'$  daily and demadex '20mg'$  daily Hold nevebilol in setting of cocaine abuse  PRN hydralazine parameters

## 2022-11-01 NOTE — Assessment & Plan Note (Signed)
Has some complaints of chest pain, but states it's "been awhile."  +cocaine Initial troponin wnl and no EKG changes Keep on telemetry

## 2022-11-01 NOTE — Assessment & Plan Note (Signed)
Last used crack cocaine 2 days ago UDS pending Hold beta blocker for now Social work consult

## 2022-11-01 NOTE — H&P (Signed)
History and Physical    Patient: Carol [name] [id number] DOB: 1961/07/13 DOA: 11/01/2022 DOS: the patient was seen and examined on 11/01/2022 PCP: Simona Huh, NP  Patient coming from: Home - lives alone. Uses walker to ambulate.    Chief Complaint: AMS/hypoglycemia   HPI: Bethsy Scarantino is a 62 y.o. female with medical history significant of  T2DM, HTN, cocaine abuse, stage 4 CKD, chronic diastolic CHF and CAD who presented to ED with AMS. Her significant other had a hard time arouses her. Blood sugar with EMS in the 40s, given D50.  When arrived to ED glucose in the 20s and started on D10 drip. She is on insulin at home, glipizide and metformin. Her fiance states when he got home around 2pm yesterday she was spaced out and acting strange. He checked her blood sugar and it was 21.  He gave her fruit with sugar and boost and rechecked her sugar and it was 40s. He gave her another boost and cookies and after this her sugar was back down to 30 and so he called EMS. She states she takes 12 units of Lantus, but her sig says 5 units. She increased her insulin to 12 units on her own. She also smokes crack cocaine and doesn't eat much of the day when she smokes.  She also complaints of dysphagia that has been going on for years.  She feels like she can't swallow at times and gets panicked then throws up.  She has no problems with liquids. She has problems with solids that seem to get stuck.   She does not smoke, does not drink alcohol except a beer occasionally. She admits to crack cocaine and last used 2 days ago.   ER Course:  vitals: temp: 97.5, bp: 185/91, HR: 79, RR: 18, oxygen: 100%RA Pertinent labs: hgb: 9.6, CO2: 17, glucose; 24, creatinine: 2.85,  In ED: started on D10 gtt, [facility] asked to admit.    Review of Systems: As mentioned in the history of present illness. All other systems reviewed and are negative. Past Medical History:  Diagnosis Date   Abnormal liver  function tests    Acute kidney injury ([place]) 01/2014   Hospitalized, Volume depletion, nausea and vomiting   Anxiety    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   Chronic back pain    Depression    Diabetes mellitus    GERD (gastroesophageal reflux disease)    Heart murmur    Hypertension    NSTEMI (non-ST elevated myocardial infarction) ([place]) 12/20/2021   Palpitations    Parkinson's disease    Past Surgical History:  Procedure Laterality Date   CARPAL TUNNEL RELEASE     IR FLUORO GUIDE CV LINE RIGHT  01/26/2022   IR US GUIDE VASC ACCESS RIGHT  01/26/2022   LEFT HEART CATH AND CORONARY ANGIOGRAPHY N/A 12/20/2021   Procedure: LEFT HEART CATH AND CORONARY ANGIOGRAPHY;  Surgeon: Burnell Blanks, MD;  Location: [facility];  Service: Cardiovascular;  Laterality: N/A;   OPEN REDUCTION INTERNAL FIXATION (ORIF) FINGER WITH RADIAL BONE GRAFT Right 09/12/2015   Procedure: RIGHT MIDDLE FINGER CLOSED REDUCTION AND PINNING;  Surgeon: Iran Planas, MD;  Location: [facility];  Service: Orthopedics;  Laterality: Right;   TONSILLECTOMY     TUBAL LIGATION     Social History:  reports that she has been smoking cigarettes. She has a 0.75 pack-year smoking history. She has never used smokeless tobacco. She reports current alcohol use. She reports that  she does not use drugs.  Allergies  Allergen Reactions   Penicillins Shortness Of Breath and Other (See Comments)    Caused yeast infection Has patient had a PCN reaction causing immediate rash, facial/tongue/throat swelling, SOB or lightheadedness with hypotension: Yes Has patient had a PCN reaction causing severe rash involving mucus membranes or skin necrosis: No Has patient had a PCN reaction that required hospitalization pt was in the hospital at the time of the reaction Has patient had a PCN reaction occurring within the last 10 years: No If all of the above answers are "NO", then may proceed with Cephalosp   Ultram [Tramadol] Other  (See Comments)    Made her tongue raw    Family History  Problem Relation Age of Onset   Diabetes Mother    Kidney disease Mother    Seizures Father    Hypertension Father    Stroke Father    Asthma Other     Prior to Admission medications   Medication Sig Start Date End Date Taking? Authorizing Provider  albuterol (PROVENTIL HFA;VENTOLIN HFA) 108 (90 Base) MCG/ACT inhaler Inhale 2 puffs into the lungs every 4 (four) hours as needed for wheezing or shortness of breath.   Yes [provider]  amLODipine (NORVASC) 10 MG tablet Take 1 tablet (10 mg total) by mouth daily. 08/01/22  Yes Milford, Maricela Bo, FNP  aspirin EC 81 MG tablet Take 1 tablet (81 mg total) by mouth daily. Swallow whole. 05/11/22  Yes Iona Beard, MD  atorvastatin (LIPITOR) 80 MG tablet Take 1 tablet (80 mg total) by mouth daily. 05/11/22  Yes Iona Beard, MD  budesonide-formoterol [facility]) 160-4.5 MCG/ACT inhaler Inhale 2 puffs into the lungs every morning. 11/09/21  Yes [provider]  cetirizine (ZYRTEC) 10 MG tablet Take 10 mg by mouth daily as needed for allergies.   Yes [provider]  clopidogrel (PLAVIX) 75 MG tablet Take 1 tablet (75 mg total) by mouth daily. 05/11/22  Yes Iona Beard, MD  gabapentin (NEURONTIN) 100 MG capsule Take 2 capsules (200 mg total) by mouth 2 (two) times daily. Patient taking differently: Take 100 mg by mouth at bedtime. 05/11/22  Yes Iona Beard, MD  glimepiride (AMARYL) 2 MG tablet Take 2 tablets (4 mg total) by mouth 2 (two) times daily. 05/11/22 11/01/23 Yes Iona Beard, MD  hydrALAZINE (APRESOLINE) 50 MG tablet Take 1 tablet (50 mg total) by mouth 3 (three) times daily. 07/04/22 07/04/23 Yes Milford, Maricela Bo, FNP  hydrOXYzine (ATARAX) 50 MG tablet Take 50 mg by mouth at bedtime. 07/02/22  Yes [provider]  ibuprofen (ADVIL) 800 MG tablet Take 800 mg by mouth daily as needed for moderate pain. 08/02/22  Yes [provider]  insulin glargine (LANTUS) 100 UNIT/ML Solostar Pen Inject 5 Units into the skin at bedtime. 05/11/22  Yes Iona Beard, MD  isosorbide mononitrate (IMDUR) 30 MG 24 hr tablet Take 1/2 tablet (15 mg total) by mouth daily. 08/01/22  Yes Milford, Maricela Bo, FNP  metFORMIN (GLUCOPHAGE) 500 MG tablet Take 500 mg by mouth 2 (two) times daily. 07/02/22  Yes [provider]  Nebivolol HCl 20 MG TABS Take 1 tablet (20 mg total) by mouth every morning. 05/11/22  Yes Iona Beard, MD  nitroGLYCERIN (NITROSTAT) 0.4 MG SL tablet Place 1 tablet (0.4 mg total) under the tongue every 5 (five) minutes as needed for chest pain. 05/11/22 11/01/23 Yes Iona Beard, MD  pantoprazole (PROTONIX) 40 MG tablet Take 1 tablet (40  mg total) by mouth daily at 12 noon. 05/11/22  Yes Iona Beard, MD  potassium chloride SA (KLOR-CON M) 10 MEQ tablet Take 1 tablet (10 mEq total) by mouth daily. 07/04/22  Yes Milford, Maricela Bo, FNP  QUEtiapine (SEROQUEL XR) 300 MG 24 hr tablet Take 1 tablet (300 mg total) by mouth at bedtime. 05/11/22 11/01/23 Yes Iona Beard, MD  sertraline (ZOLOFT) 50 MG tablet Take 1 tablet (50 mg total) by mouth every evening. 05/11/22 11/01/23 Yes Iona Beard, MD  torsemide (DEMADEX) 20 MG tablet Take 1 tablet (20 mg total) by mouth daily. 07/04/22  Yes Milford, Maricela Bo, FNP  ergocalciferol (VITAMIN D2) 1.25 MG (50000 UT) capsule Take 1 capsule (50,000 Units total) by mouth once a week. Wednesday Patient not taking: Reported on 11/01/2022 05/11/22   Iona Beard, MD  Insulin Pen Needle (PEN NEEDLES) 32G X 4 MM MISC 1 Units by Does not apply route daily. 05/10/22   Iona Beard, MD    Physical Exam: Vitals:   11/01/22 1015 11/01/22 1045 11/01/22 1256 11/01/22 1300  BP: (!) 197/111 (!) 185/95  (!) 176/100  Pulse: 93 94  (!) 104  Resp: (!) 27 15  (!) 26  Temp:   97.7 F (36.5 C)   TempSrc:   Oral   SpO2: 100% 100%  100%   General:  Appears calm and comfortable and is in NAD Eyes:   PERRL, EOMI, normal lids, iris ENT:  grossly normal hearing, lips & tongue, mmm; appropriate dentition Neck:  no LAD, masses or thyromegaly; no carotid bruits Cardiovascular:  RRR, no m/r/g. Bilateral LE edema  Respiratory:   CTA bilaterally with no wheezes/rales/rhonchi.  Normal respiratory effort. Abdomen:  soft, NT, ND, NABS Back:   normal alignment, no CVAT Skin:  no rash or induration seen on limited exam Musculoskeletal:  grossly normal tone BUE/BLE, good ROM, no bony abnormality Lower extremity:   Limited foot exam with no ulcerations.  2+ distal pulses. Psychiatric:  grossly normal mood and affect, speech fluent and appropriate, AOx3 Neurologic:  CN 2-12 grossly intact, moves all extremities in coordinated fashion, sensation intact   Radiological Exams on Admission: Independently reviewed - see discussion in A/P where applicable  No results found.  EKG: Independently reviewed.  NSR with rate 86; nonspecific ST changes with no evidence of acute ischemia. Borderline prolonged QT   Labs on Admission: I have personally reviewed the available labs and imaging studies at the time of the admission.  Pertinent labs:    hgb: 9.6,  CO2: 17,  glucose; 24,  creatinine: 2.85  Assessment and Plan: Principal Problem:   Diabetic hypoglycemia (HCC) Active Problems:   Dysphagia   borderline Prolonged QT interval   CKD (chronic kidney disease) stage 4, GFR 15-29 ml/min (HCC)   cocaine abuse   Essential hypertension   CAD in native artery   Anxiety with depression   Chronic diastolic CHF (congestive heart failure) (HCC)    Assessment and Plan: * Diabetic hypoglycemia ([place]) 62 year old female who presented to ED with AMS in setting of recurrent hypoglycemia in a type 2 diabetic on insulin  -obs to progressive -continue D10, increase rate to 100cc/hour with low reading at 75cc/hour  -A1C 6.5 in 04/2022, repeat pending -CBG q 2 hours until 3 consistent readings >120 then will wean  off -no signs of infection at this point.  -likely due to self adjusting insulin (increasing to 12 units) and not eating while doing crack cocaine, +hx of dysphagia with  poor PO intake.  -Unsure why on glizide with recurrent hypoglycemia with A1C of 6.5. would not re start this at d/c. Clearly hold while inpatient -Patient with CKD stage IV, unsure if still on metformin but now with lactic acidosis, would not re start this -Home insulin regimen includes lantus 5 units QHS but she is taking 12 units on her own accord -Decreased PO intake in setting of complaints of dysphagia and food getting stuck  -She also states when she smokes crack cocaine she doesn't eat at all during the day    Dysphagia Years long issue of swallowing solid foods, liquids seem to be fine.  She feels like it gets stuck in her throat and then she throws up-nearly daily  If she takes small bites and soft food it helps SLP eval ordered for dysphagia eval  NPO until this and SLP has ordered MBSS Failed, recommend GI eval, consulted [place]   borderline Prolonged QT interval Optimize electrolytes Check magnesium and replete if needed  Keep on telemetry Avoid qt prolonging drugs  Repeat ekg in AM    CKD (chronic kidney disease) stage 4, GFR 15-29 ml/min (HCC) Baseline creatinine around 2.9-3.0 Stable, continue to monitor   cocaine abuse Last used crack cocaine 2 days ago UDS pending Hold beta blocker for now Social work consult   Essential hypertension Extremely elevated, but has not had any medication. Asymptomatic Give home medication now: norvasc '10mg'$  daily, hydralazine '50mg'$  TID, imdur '15mg'$  daily and demadex '20mg'$  daily Hold nevebilol in setting of cocaine abuse  PRN hydralazine parameters   CAD in native artery Has some complaints of chest pain, but states it's "been awhile."  +cocaine Initial troponin wnl and no EKG changes Keep on telemetry   Anxiety with depression Continue zoloft '50mg'$  and  hydroxyzine prn   Chronic diastolic CHF (congestive heart failure) ([place]) Echo in 12/2021 showed normal EF with grade 1 DD.  Appears dry to euvolemic Strict I/O and daily weights  Continue Demadex '20mg'$  daily     Advance Care Planning:   Code Status: Full Code   Consults: SW/SLP/GI   DVT Prophylaxis: lovenox, renally adjusted   Family Communication: fiance at bedside   Severity of Illness: The appropriate patient status for this patient is OBSERVATION. Observation status is judged to be reasonable and necessary in order to provide the required intensity of service to ensure the patient's safety. The patient's presenting symptoms, physical exam findings, and initial radiographic and laboratory data in the context of their medical condition is felt to place them at decreased risk for further clinical deterioration. Furthermore, it is anticipated that the patient will be medically stable for discharge from the hospital within 2 midnights of admission.   Author: Orma Flaming, MD 11/01/2022 2:31 PM  For on call review [email or web address].

## 2022-11-01 NOTE — ED Notes (Signed)
ED TO INPATIENT HANDOFF REPORT  ED Nurse Name and Phone #: [phone]  S Name/Age/Gender Carol [name] 62 y.o. female Room/Bed: 046C/046C  Code Status   Code Status: Full Code  Home/SNF/Other Home Patient oriented to: self, place, time, and situation Is this baseline? Yes   Triage Complete: Triage complete  Chief Complaint Diabetic hypoglycemia [facility]) [E11.649]  Triage Note Chief Complaint  Patient presents with   Hypoglycemia   BP (!) 197/91 (BP Location: Right Arm)   Pulse 79   Temp (!) 97.3 F (36.3 C) (Oral)   Resp 18   SpO2 99%   Pt presents to ED 26 via EMS from home with above complaint.  Per report, family reported pt's blood sugar dropped twice during day and was 45 upon check.  Family wanted to try food/juice with no improvement.  Pt somewhat drowsy; reportedly took her nightly medications including her "sleepy meds."  Unsure if pt took extra doses or not.  Pt given D10 by EMS with glucose recheck of 213.   Allergies Allergies  Allergen Reactions   Penicillins Shortness Of Breath and Other (See Comments)    Caused yeast infection Has patient had a PCN reaction causing immediate rash, facial/tongue/throat swelling, SOB or lightheadedness with hypotension: Yes Has patient had a PCN reaction causing severe rash involving mucus membranes or skin necrosis: No Has patient had a PCN reaction that required hospitalization pt was in the hospital at the time of the reaction Has patient had a PCN reaction occurring within the last 10 years: No If all of the above answers are "NO", then may proceed with Cephalosp   Ultram [Tramadol] Other (See Comments)    Made her tongue raw    Level of Care/Admitting Diagnosis ED Disposition     ED Disposition  Admit   Condition  --   [place]: [place] [[id number]]  Level of Care: Progressive [102]  Admit to Progressive based on following criteria: GI, ENDOCRINE disease patients  with GI bleeding, acute liver failure or pancreatitis, stable with diabetic ketoacidosis or thyrotoxicosis (hypothyroid) state.  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   Chronic back pain    Depression    Diabetes mellitus    GERD (gastroesophageal reflux disease)    Heart murmur    Hypertension    NSTEMI (non-ST elevated myocardial infarction) ([place]) 12/20/2021   Palpitations    Parkinson's disease    Past Surgical History:  Procedure Laterality Date   CARPAL TUNNEL RELEASE     IR FLUORO GUIDE CV LINE RIGHT  01/26/2022   IR US GUIDE VASC ACCESS RIGHT  01/26/2022   LEFT HEART CATH AND CORONARY ANGIOGRAPHY N/A 12/20/2021   Procedure: LEFT HEART CATH AND CORONARY ANGIOGRAPHY;  Surgeon: Burnell Blanks, MD;  Location: [facility];  Service: Cardiovascular;  Laterality: N/A;   OPEN REDUCTION INTERNAL FIXATION (ORIF) FINGER WITH RADIAL BONE GRAFT Right  09/12/2015   Procedure: RIGHT MIDDLE FINGER CLOSED REDUCTION AND PINNING;  Surgeon: Iran Planas, MD;  Location: [facility];  Service: Orthopedics;  Laterality: Right;   TONSILLECTOMY     TUBAL LIGATION       A IV Location/Drains/Wounds Patient Lines/Drains/Airways Status     Active Line/Drains/Airways     Name Placement date Placement time Site Days   Peripheral IV 11/01/22 20 G Right Antecubital 11/01/22  --  Antecubital  less than 1   Peripheral IV 11/01/22 22 G 1.16"  Anterior;Right Hand 11/01/22  1621  Hand  less than 1   External Urinary Catheter 11/01/22  1444  --  less than 1   Incision (Closed) 09/12/15 Hand Right 09/12/15  1749  -- 2607   Wound / Incision (Open or Dehisced) 02/01/22 Other (Comment) Arm Anterior;Left Odd IV insertion site 02/01/22  1024  Arm  273            Intake/Output Last 24 hours No intake or output data in the 24 hours ending 11/01/22 1633  Labs/Imaging Results for orders placed or performed during the hospital encounter of 11/01/22 (from the past 48 hour(s))  Urinalysis, Routine w reflex microscopic -Urine, Clean Catch     Status: Abnormal   Collection Time: 11/01/22  3:45 AM  Result Value Ref Range   Color, Urine YELLOW YELLOW   APPearance HAZY (A) CLEAR   Specific Gravity, Urine 1.004 (L) 1.005 - 1.030   pH 7.0 5.0 - 8.0   Glucose, UA NEGATIVE NEGATIVE mg/dL   Hgb urine dipstick NEGATIVE NEGATIVE   Bilirubin Urine NEGATIVE NEGATIVE   Ketones, ur NEGATIVE NEGATIVE mg/dL   Protein, ur >=300 (A) NEGATIVE mg/dL   Nitrite NEGATIVE NEGATIVE   Leukocytes,Ua MODERATE (A) NEGATIVE   RBC / HPF 0-5 0 - 5 RBC/hpf   WBC, UA 11-20 0 - 5 WBC/hpf   Bacteria, UA MANY (A) NONE SEEN   Squamous Epithelial / HPF 0-5 0 - 5 /HPF    Comment: Performed at [facility], [address], [place], [place] [address]  CBG monitoring, ED (now and then every hour for 3 hours)     Status: Abnormal   Collection Time: 11/01/22  4:35 AM  Result Value Ref Range   Glucose-Capillary 27 (LL) 70 - 99 mg/dL    Comment: Glucose reference range applies only to samples taken after fasting for at least 8 hours.   Comment 1 Notify RN   Basic metabolic panel     Status: Abnormal   Collection Time: 11/01/22  4:44 AM  Result Value Ref Range   Sodium 136 135 - 145 mmol/L   Potassium 3.7 3.5 - 5.1 mmol/L   Chloride 110 98 - 111 mmol/L   CO2 17 (L) 22 - 32 mmol/L   Glucose, Bld 24 (LL) 70 - 99 mg/dL    Comment: CRITICAL RESULT CALLED TO, READ BACK  BY AND VERIFIED WITH Alinda Deem RN 11/01/22 0606 M KOROLESKI Glucose reference range applies only to samples taken after fasting for at least 8 hours.    BUN 19 6 - 20 mg/dL   Creatinine, Ser 2.85 (H) 0.44 - 1.00 mg/dL   Calcium 8.1 (L) 8.9 - 10.3 mg/dL   GFR, Estimated 18 (L) >60 mL/min    Comment: (NOTE) Calculated using the CKD-EPI Creatinine Equation (2021)    Anion gap 9 5 - 15    Comment: Performed at [facility] [address], [place], [place] [address]  CBC     Status: Abnormal   Collection Time: 11/01/22  4:44 AM  Result Value Ref Range   WBC 4.8 4.0 - 10.5 K/uL   RBC 3.21 (L) 3.87 - 5.11 MIL/uL   Hemoglobin 9.6 (L) 12.0 - 15.0 g/dL   HCT 29.0 (L) 36.0 - 46.0 %   MCV 90.3 80.0 - 100.0 fL   MCH 29.9 26.0 - 34.0 pg   MCHC 33.1 30.0 - 36.0 g/dL   RDW 13.6 11.5 - 15.5 %   Platelets 186 150 - 400 K/uL   nRBC 0.0 0.0 - 0.2 %    Comment: Performed at [facility], [place] [address] [place], [place] [address]  CBG monitoring, ED (now and then every hour for 3 hours)     Status: Abnormal   Collection Time: 11/01/22  5:02 AM  Result Value Ref Range   Glucose-Capillary 123 (H) 70 - 99 mg/dL    Comment: Glucose reference range applies only to samples taken after fasting for at least 8 hours.  CBG monitoring, ED (now and then every hour for 3 hours)     Status: Abnormal   Collection Time: 11/01/22  6:39 AM  Result Value Ref Range   Glucose-Capillary 46 (L) 70 - 99 mg/dL    Comment: Glucose reference range applies only to samples taken after fasting for at least 8 hours.   Comment 1 Repeat Test   CBG monitoring, ED     Status: Abnormal   Collection Time: 11/01/22  6:40 AM  Result Value Ref Range   Glucose-Capillary 42 (LL) 70 - 99 mg/dL    Comment: Glucose reference range applies only to samples taken after fasting for at least 8 hours.   Comment 1 Call MD NNP PA CNM   CBG monitoring, ED     Status: None   Collection Time: 11/01/22  7:26 AM  Result Value Ref  Range   Glucose-Capillary 81 70 - 99 mg/dL    Comment: Glucose reference range applies only to samples taken after fasting for at least 8 hours.  CBG monitoring, ED     Status: Abnormal   Collection Time: 11/01/22  8:35 AM  Result Value Ref Range   Glucose-Capillary 48 (L) 70 - 99 mg/dL    Comment: Glucose reference range applies only to samples taken after fasting for at least 8 hours.  TSH     Status: None   Collection Time: 11/01/22  9:11 AM  Result Value Ref Range   TSH 0.646 0.350 - 4.500 uIU/mL    Comment: Performed by a 3rd Generation assay with a functional sensitivity of <=0.01 uIU/mL. Performed at [facility], [place] [address], [place], [place] [address]   Vitamin B12     Status: None   Collection Time: 11/01/22  9:11 AM  Result Value Ref Range   Vitamin B-12 523 180 - 914 pg/mL    Comment: (NOTE) This assay is not validated for testing neonatal or myeloproliferative syndrome specimens for Vitamin B12 levels. Performed at [facility], [place] [address] [place], [place] [address]   CBG monitoring, ED     Status: None   Collection Time: 11/01/22  9:53 AM  Result Value Ref Range   Glucose-Capillary 77 70 - 99 mg/dL    Comment: Glucose reference range applies only to samples taken after fasting for at least 8 hours.  Rapid urine drug screen (hospital performed)     Status: Abnormal   Collection  Time: 11/01/22 10:30 AM  Result Value Ref Range   Opiates NONE DETECTED NONE DETECTED   Cocaine POSITIVE (A) NONE DETECTED   Benzodiazepines NONE DETECTED NONE DETECTED   Amphetamines NONE DETECTED NONE DETECTED   Tetrahydrocannabinol NONE DETECTED NONE DETECTED   Barbiturates NONE DETECTED NONE DETECTED    Comment: (NOTE) DRUG SCREEN FOR MEDICAL PURPOSES ONLY.  IF CONFIRMATION IS NEEDED FOR ANY PURPOSE, NOTIFY LAB WITHIN 5 DAYS.  LOWEST DETECTABLE LIMITS FOR URINE DRUG SCREEN Drug Class                     Cutoff (ng/mL) Amphetamine and metabolites     1000 Barbiturate and metabolites    200 Benzodiazepine                 200 Opiates and metabolites        300 Cocaine and metabolites        300 THC                            50 Performed at [facility], [place] [address], [place], [place] [address]   CBG monitoring, ED     Status: Abnormal   Collection Time: 11/01/22 11:29 AM  Result Value Ref Range   Glucose-Capillary 122 (H) 70 - 99 mg/dL    Comment: Glucose reference range applies only to samples taken after fasting for at least 8 hours.  Magnesium     Status: Abnormal   Collection Time: 11/01/22 12:14 PM  Result Value Ref Range   Magnesium 1.5 (L) 1.7 - 2.4 mg/dL    Comment: Performed at [facility] [address] [place], [place] [address]  Troponin I (High Sensitivity)     Status: None   Collection Time: 11/01/22 12:14 PM  Result Value Ref Range   Troponin I (High Sensitivity) 12 <18 ng/L    Comment: (NOTE) Elevated high sensitivity troponin I (hsTnI) values and significant  changes across serial measurements may suggest ACS but many other  chronic and acute conditions are known to elevate hsTnI results.  Refer to the "Links" section for chest pain algorithms and additional  guidance. Performed at [facility], [place] [address] [place], [place] [address]   Hepatic function panel     Status: Abnormal   Collection Time: 11/01/22 12:14 PM  Result Value Ref Range   Total Protein 6.2 (L) 6.5 - 8.1 g/dL   Albumin 2.0 (L) 3.5 - 5.0 g/dL   AST 22 15 - 41 U/L   ALT 10 0 - 44 U/L   Alkaline Phosphatase 124 38 - 126 U/L   Total Bilirubin 0.8 0.3 - 1.2 mg/dL   Bilirubin, Direct 0.2 0.0 - 0.2 mg/dL   Indirect Bilirubin 0.6 0.3 - 0.9 mg/dL    Comment: Performed at [facility] [address], [place],  [address]  CBG monitoring, ED     Status: Abnormal   Collection Time: 11/01/22  2:17 PM  Result Value Ref Range   Glucose-Capillary 115 (H) 70 - 99 mg/dL    Comment: Glucose reference range  applies only to samples taken after fasting for at least 8 hours.   DG Swallowing Func-Speech Pathology  Result Date: 11/01/2022 Table formatting from the original result was not included. Modified Barium Swallow Study Patient Details Name: Shainah Talwar MRN: [id number] Date of Birth: 01-13-1961 Today's Date: 11/01/2022 HPI/PMH: HPI: [place]  Meghan Helgren is a 62 y.o. female who presented to the emergency department for episode of hypoglycemia with BGL 27 on arrival. 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 During oral phase, patient did exhibit anterior to posterior transit delays. Swallow was initiated at level of pyriform sinus with both thin and nectar thick liquids with patient gagging during the swallow and exhibiting belching afterwards. Prominent cricopharyngeal bar observed which did appear to impact bolus transit. No penetration or aspiration observed with thin liquids. With nectar thick liquid via spoon sip, patient started gagging during the swallow, which led to coughing and penetration of nectar, but no aspiration. SLP then ended the study as patient continued to cough and started gasping for air. She did calm down and although she continued to belch and cough, no regurgitation observed. SLP consulted with MD and both in agreement for GI referral but in the meantime, to initiate a PO diet of clear liquids (thin consistency) and meds whole in puree. SLP will follow briefly for toleration. Factors that may increase risk of adverse event in presence of aspiration ([place]  2021): No data recorded Recommendations/Plan: Swallowing Evaluation Recommendations Swallowing Evaluation Recommendations Recommendations: PO diet PO Diet Recommendation: Thin liquids (Level 0) Liquid Administration via: Cup; Straw Medication Administration: Whole meds with puree Supervision: Patient able to self-feed Swallowing strategies  : Slow rate; Small bites/sips Postural changes: Position pt fully upright for meals; Stay upright 30-60 min after meals Oral care recommendations: Oral care BID (2x/day) Recommended consults: Consider GI consultation Treatment Plan Treatment Plan Treatment recommendations: Therapy as outlined in treatment plan below Follow-up recommendations: Follow physicians's recommendations for discharge plan and follow up therapies Functional status assessment: Patient has had a recent decline in their functional status and demonstrates the ability to make significant improvements in function in a reasonable and predictable amount of time. Treatment frequency: Min 1x/week Treatment duration: 1 week Interventions: Patient/family education; Diet toleration management by SLP Recommendations Recommendations for follow up therapy are one component of a multi-disciplinary discharge planning process, led by the attending physician.  Recommendations may be updated based on patient status, additional functional criteria and insurance authorization. Assessment: Orofacial Exam: Orofacial Exam Oral Cavity: Oral Hygiene: WFL Oral Cavity - Dentition: Missing dentition Orofacial Anatomy: WFL Oral Motor/Sensory Function: WFL Anatomy: Anatomy: WFL Thin Liquids: Thin Liquids (Level 0) Thin Liquids : Impaired Bolus delivery method: Cup; Spoon Thin Liquid - Impairment: Oral Impairment; Pharyngeal impairment Lip Closure: No labial escape Tongue control during bolus hold: Cohesive bolus between tongue to palatal seal Bolus transport/lingual motion: Slow tongue motion Oral residue: Complete oral clearance Location  of oral residue : N/A Initiation of swallow : Pyriform sinuses Soft palate elevation: No bolus between soft palate (SP)/pharyngeal wall (PW) Laryngeal elevation: Complete superior movement of thyroid cartilage with complete approximation of arytenoids to epiglottic petiole Anterior hyoid excursion: Complete Epiglottic movement: Complete Laryngeal vestibule closure: Complete, no air/contrast in laryngeal vestibule Pharyngeal stripping wave : Present - complete Pharyngeal contraction (A/P view only): N/A Pharyngoesophageal segment opening: Partial distention/partial duration, partial obstruction of flow Tongue base retraction: No contrast between tongue base and posterior pharyngeal wall (PPW) Pharyngeal residue: Complete pharyngeal clearance Location of pharyngeal residue: N/A Penetration/Aspiration Scale (PAS)  score: 1.  Material does not enter airway  Mildly Thick Liquids: Mildly thick liquids (Level 2, nectar thick) Mildly thick liquids (Level 2, nectar thick): Impaired Bolus delivery method: Spoon Mildly Thick Liquid - Impairment: Pharyngeal impairment Initiation of swallow : Pyriform sinuses Soft palate elevation: No bolus between soft palate (SP)/pharyngeal wall (PW) Laryngeal elevation: Complete superior movement of thyroid cartilage with complete approximation of arytenoids to epiglottic petiole Anterior hyoid excursion: Complete Epiglottic movement: Complete Laryngeal vestibule closure: Complete, no air/contrast in laryngeal vestibule Pharyngeal stripping wave : Present - complete Pharyngeal contraction (A/P view only): N/A Pharyngoesophageal segment opening: Partial distention/partial duration, partial obstruction of flow Tongue base retraction: No contrast between tongue base and posterior pharyngeal wall (PPW) Pharyngeal residue: Complete pharyngeal clearance Location of pharyngeal residue: N/A Penetration/Aspiration Scale (PAS) score: 2.  Material enters airway, remains ABOVE vocal cords then ejected out   Moderately Thick Liquids: Moderately thick liquids (Level 3, honey thick) Moderately thick liquids (Level 3, honey thick): Not Tested  Puree: Puree Puree: Not Tested Solid: Solid Solid: Not Tested Pill: Pill Pill: Not Tested Compensatory Strategies: No data recorded  General Information: Caregiver present: No  Diet Prior to this Study: NPO   Temperature : Normal   Respiratory Status: WFL   Supplemental O2: None (Room air)   History of Recent Intubation: No  Behavior/Cognition: Alert; Cooperative Self-Feeding Abilities: Able to self-feed Baseline vocal quality/speech: Normal No data recorded Volitional Swallow: Able to elicit Exam Limitations: Other (comment) (gagging of PO's) Goal Planning: Prognosis for improved oropharyngeal function: Fair Barriers to Reach Goals: Time post onset; Severity of deficits No data recorded Patient/Family Stated Goal: c/o diffuculty swallowing Consulted and agree with results and recommendations: Patient Pain: Pain Assessment Pain Assessment: No/denies pain Faces Pain Scale: 0 End of Session: Start Time:SLP Start Time (ACUTE ONLY): 1330 Stop Time: SLP Stop Time (ACUTE ONLY): 1350 Time Calculation:SLP Time Calculation (min) (ACUTE ONLY): 20 min Charges: SLP Evaluations $ SLP Speech Visit: 1 Visit SLP Evaluations $BSS Swallow: 1 Procedure $MBS Swallow: 1 Procedure SLP visit diagnosis: SLP Visit Diagnosis: Dysphagia, pharyngoesophageal phase (R13.14) Past Medical History: Past Medical History: Diagnosis Date  Abnormal liver function tests   Acute kidney injury ([place]) 01/2014  Hospitalized, Volume depletion, nausea and vomiting  Anxiety   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  Chronic back pain   Depression   Diabetes mellitus   GERD (gastroesophageal reflux disease)   Heart murmur   Hypertension   NSTEMI (non-ST elevated myocardial infarction) ([place]) 12/20/2021  Palpitations   Parkinson's disease  Past Surgical History: Past Surgical History: Procedure Laterality Date  CARPAL  TUNNEL RELEASE    IR FLUORO GUIDE CV LINE RIGHT  01/26/2022  IR US GUIDE VASC ACCESS RIGHT  01/26/2022  LEFT HEART CATH AND CORONARY ANGIOGRAPHY N/A 12/20/2021  Procedure: LEFT HEART CATH AND CORONARY ANGIOGRAPHY;  Surgeon: Burnell Blanks, MD;  Location: [facility];  Service: Cardiovascular;  Laterality: N/A;  OPEN REDUCTION INTERNAL FIXATION (ORIF) FINGER WITH RADIAL BONE GRAFT Right 09/12/2015  Procedure: RIGHT MIDDLE FINGER CLOSED REDUCTION AND PINNING;  Surgeon: Iran Planas, MD;  Location: [facility];  Service: Orthopedics;  Laterality: Right;  [place], MA, CCC-SLP Speech Therapy    Pending Labs Unresulted Labs (From admission, onward)     Start     Ordered   11/02/22 [id number]  Basic metabolic panel  Tomorrow morning,   R        11/01/22 1031  11/02/22 0500  CBC  Tomorrow morning,   R        11/01/22 1031   11/01/22 0753  Ammonia  Once,   R        11/01/22 0752   11/01/22 0749  Hemoglobin A1c  Once,   R        11/01/22 0748            Vitals/Pain Today's Vitals   11/01/22 1500 11/01/22 1515 11/01/22 1526 11/01/22 1530  BP: (!) 152/80 (!) 154/72  (!) 130/98  Pulse: 99 98  96  Resp: 15 (!) 22  14  Temp:      TempSrc:      SpO2: 100% 100%  100%  Weight:   89 kg   Height:   '5\' 4"'$  (1.626 m)     Isolation Precautions No active isolations  Medications Medications  sodium chloride flush (NS) 0.9 % injection 3 mL (3 mLs Intravenous Given 11/01/22 0952)  sodium chloride flush (NS) 0.9 % injection 3 mL (has no administration in time range)  0.9 %  sodium chloride infusion (has no administration in time range)  dextrose 10 % infusion ( Intravenous Rate/Dose Change 11/01/22 1442)  aspirin EC tablet 81 mg (81 mg Oral Given 11/01/22 0950)  amLODipine (NORVASC) tablet 10 mg (10 mg Oral Given 11/01/22 0950)  atorvastatin (LIPITOR) tablet 80 mg (80 mg Oral Given 11/01/22 0949)  hydrALAZINE (APRESOLINE) tablet 50 mg (50 mg Oral Not Given 11/01/22  1457)  isosorbide mononitrate (IMDUR) 24 hr tablet 15 mg (15 mg Oral Given 11/01/22 0949)  torsemide (DEMADEX) tablet 20 mg (20 mg Oral Given 11/01/22 0950)  hydrOXYzine (ATARAX) tablet 50 mg (has no administration in time range)  QUEtiapine (SEROQUEL XR) 24 hr tablet 300 mg (has no administration in time range)  sertraline (ZOLOFT) tablet 50 mg (has no administration in time range)  clopidogrel (PLAVIX) tablet 75 mg (75 mg Oral Given 11/01/22 0950)  gabapentin (NEURONTIN) capsule 100 mg (has no administration in time range)  potassium chloride (KLOR-CON M) CR tablet 10 mEq (10 mEq Oral Given 11/01/22 0950)  albuterol (PROVENTIL) (2.5 MG/3ML) 0.083% nebulizer solution 2.5 mg (has no administration in time range)  fluticasone furoate-vilanterol (BREO ELLIPTA) 200-25 MCG/ACT 1 puff (has no administration in time range)  hydrALAZINE (APRESOLINE) injection 10 mg (has no administration in time range)  enoxaparin (LOVENOX) injection 30 mg (30 mg Subcutaneous Given 11/01/22 1151)  acetaminophen (TYLENOL) tablet 650 mg (has no administration in time range)    Or  acetaminophen (TYLENOL) suppository 650 mg (has no administration in time range)  magnesium sulfate IVPB 2 g 50 mL (2 g Intravenous New Bag/Given 11/01/22 1630)  pantoprazole (PROTONIX) injection 40 mg (40 mg Intravenous Given 11/01/22 1627)  dextrose 50 % solution (50 mLs  Given 11/01/22 0442)  dextrose 50 % solution 25 mL (25 mLs Intravenous Given 11/01/22 0706)  dextrose 50 % solution 25 mL (25 mLs Intravenous Given 11/01/22 0842)    Mobility walks     Focused Assessments Patient presented for c/o hypoglycemia; blood glucose 24 on arrival. Treated with D10, CBG 115 at 1415. D10 running at 50/hr. Patient also talked to MD about difficulty swallowing. Did not pass swallow study with speech. MD ordered modified diet.    R Recommendations: See Admitting Provider Note  Report given to:   Additional Notes: na

## 2022-11-01 NOTE — ED Provider Notes (Signed)
[facility] Provider Note  CSN: [id number] Arrival date & time: 11/01/22 [id number]  Chief Complaint(s) Hypoglycemia  HPI Carol [name] is a 62 y.o. female with a past medical history listed below who presents to the emergency department for episode of hypoglycemia.  Patient is on NovoLog, Lantus for diabetes.  Also noted to be on glimepiride and metformin.  Patient currently unable to provide details regarding her history since she took her nighttime medicine which included Seroquel.  She was able to tell me that she took her Lantus yesterday morning.  Her friend at bedside reported that around 9 PM they noted that the patient's blood sugars were in the 30s.  She was given orange juice and food last night.  A couple hours later blood sugar level was rechecked and in the upper 60s.  This evening patient was hard to arouse prompting a call to EMS.  CBG is noted to be in the 40s.  Improved after D10.  The history is provided by a friend.    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   Chronic back pain    Depression    Diabetes mellitus    GERD (gastroesophageal reflux disease)    Heart murmur    Hypertension    NSTEMI (non-ST elevated myocardial infarction) ([place]) 12/20/2021   Palpitations    Parkinson's disease    Patient Active Problem List   Diagnosis Date Noted   Acute exacerbation of CHF (congestive heart failure) ([place]) 05/09/2022   High anion gap metabolic acidosis 123456   Chest pain, atypical 05/09/2022   Acute on chronic diastolic (congestive) heart failure ([place]) 03/22/2022   COPD not affecting current episode of care 03/22/2022   Class 2 obesity due to excess calories with body mass index (BMI) of 37.0 to 37.9 in adult  03/22/2022   CHF (congestive heart failure) ([place]) 03/14/2022   CKD (chronic kidney disease), stage IV ([place]) 03/10/2022   Hypertensive urgency 01/22/2022   Acute kidney injury superimposed on chronic kidney disease ([place]) 01/22/2022   Acute on chronic diastolic CHF (congestive heart failure) ([place]) 01/22/2022   Prolonged QT interval 01/22/2022   Elevated troponin 01/22/2022   Thyroid nodule 01/22/2022   chest pain/shortness of breath in setting of Cocaine abuse-now resolved 01/02/2022   Diabetic hypoglycemia ([place]) 12/31/2021   CKD (chronic kidney disease) stage 4, GFR 15-29 ml/min ([place]) 12/31/2021   CAD in native artery 12/24/2021   Anxiety with depression 12/24/2021   Pure hypercholesterolemia    Colon cancer screening 02/18/2014   Diabetes mellitus ([place]) 01/29/2014   Essential hypertension 01/29/2014   Closed jaw fracture ([place]) 01/29/2014   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 Authorizing Provider  albuterol (PROVENTIL HFA;VENTOLIN HFA) 108 (90 Base) MCG/ACT inhaler Inhale 2 puffs into the lungs every 4 (four) hours as needed for wheezing or shortness of breath.   Yes [provider]  amLODipine (NORVASC) 10 MG tablet Take 1 tablet (10 mg total) by mouth daily. 08/01/22  Yes Milford, Maricela Bo, FNP  aspirin EC 81 MG tablet Take 1 tablet (81 mg total) by mouth daily. Swallow whole. 05/11/22  Yes Iona Beard, MD  atorvastatin (LIPITOR)  80 MG tablet Take 1 tablet (80 mg total) by mouth daily. 05/11/22  Yes Iona Beard, MD  budesonide-formoterol [facility]) 160-4.5 MCG/ACT inhaler Inhale 2 puffs into the lungs every morning. 11/09/21  Yes [provider]  cetirizine (ZYRTEC) 10 MG tablet Take 10 mg by mouth daily as needed for allergies.   Yes [provider]  clopidogrel (PLAVIX) 75 MG tablet Take 1 tablet (75 mg total) by mouth daily. 05/11/22  Yes  Iona Beard, MD  gabapentin (NEURONTIN) 100 MG capsule Take 2 capsules (200 mg total) by mouth 2 (two) times daily. Patient taking differently: Take 100 mg by mouth at bedtime. 05/11/22  Yes Iona Beard, MD  glimepiride (AMARYL) 2 MG tablet Take 2 tablets (4 mg total) by mouth 2 (two) times daily. 05/11/22 11/01/23 Yes Iona Beard, MD  hydrALAZINE (APRESOLINE) 50 MG tablet Take 1 tablet (50 mg total) by mouth 3 (three) times daily. 07/04/22 07/04/23 Yes Milford, Maricela Bo, FNP  hydrOXYzine (ATARAX) 50 MG tablet Take 50 mg by mouth at bedtime. 07/02/22  Yes [provider]  ibuprofen (ADVIL) 800 MG tablet Take 800 mg by mouth daily as needed for moderate pain. 08/02/22  Yes [provider]  insulin glargine (LANTUS) 100 UNIT/ML Solostar Pen Inject 5 Units into the skin at bedtime. 05/11/22  Yes Iona Beard, MD  isosorbide mononitrate (IMDUR) 30 MG 24 hr tablet Take 1/2 tablet (15 mg total) by mouth daily. 08/01/22  Yes Milford, Maricela Bo, FNP  metFORMIN (GLUCOPHAGE) 500 MG tablet Take 500 mg by mouth 2 (two) times daily. 07/02/22  Yes [provider]  Nebivolol HCl 20 MG TABS Take 1 tablet (20 mg total) by mouth every morning. 05/11/22  Yes Iona Beard, MD  nitroGLYCERIN (NITROSTAT) 0.4 MG SL tablet Place 1 tablet (0.4 mg total) under the tongue every 5 (five) minutes as needed for chest pain. 05/11/22 11/01/23 Yes Iona Beard, MD  pantoprazole (PROTONIX) 40 MG tablet Take 1 tablet (40 mg total) by mouth daily at 12 noon. 05/11/22  Yes Iona Beard, MD  potassium chloride SA (KLOR-CON M) 10 MEQ tablet Take 1 tablet (10 mEq total) by mouth daily. 07/04/22  Yes Milford, Maricela Bo, FNP  QUEtiapine (SEROQUEL XR) 300 MG 24 hr tablet Take 1 tablet (300 mg total) by mouth at bedtime. 05/11/22 11/01/23 Yes Iona Beard, MD  sertraline (ZOLOFT) 50 MG tablet Take 1 tablet (50 mg total) by mouth every evening. 05/11/22 11/01/23 Yes Iona Beard, MD  torsemide (DEMADEX) 20  MG tablet Take 1 tablet (20 mg total) by mouth daily. 07/04/22  Yes Milford, Maricela Bo, FNP  ergocalciferol (VITAMIN D2) 1.25 MG (50000 UT) capsule Take 1 capsule (50,000 Units total) by mouth once a week. Wednesday Patient not taking: Reported on 11/01/2022 05/11/22   Iona Beard, MD  Insulin Pen Needle (PEN NEEDLES) 32G X 4 MM MISC 1 Units by Does not apply route daily. 05/10/22   Iona Beard, MD  Allergies Penicillins and Ultram [tramadol]  Review of Systems Review of Systems As noted in HPI  Physical Exam Vital Signs  I have reviewed the triage vital signs BP (!) 207/115 (BP Location: Right Arm)   Pulse 79   Temp (!) 97.5 F (36.4 C) (Oral)   Resp 18   SpO2 100%   Physical Exam Vitals reviewed.  Constitutional:      General: She is not in acute distress.    Appearance: She is well-developed. She is not diaphoretic.  HENT:     Head: Normocephalic and atraumatic.     Nose: Nose normal.  Eyes:     General: No scleral icterus.       Right eye: No discharge.        Left eye: No discharge.     Conjunctiva/sclera: Conjunctivae normal.     Pupils: Pupils are equal, round, and reactive to light.  Cardiovascular:     Rate and Rhythm: Normal rate and regular rhythm.     Heart sounds: No murmur heard.    No friction rub. No gallop.  Pulmonary:     Effort: Pulmonary effort is normal. No respiratory distress.     Breath sounds: Normal breath sounds. No stridor. No rales.  Abdominal:     General: There is no distension.     Palpations: Abdomen is soft.     Tenderness: There is no abdominal tenderness.  Musculoskeletal:        General: No tenderness.     Cervical back: Normal range of motion and neck supple.  Skin:    General: Skin is warm and dry.     Findings: No erythema or rash.  Neurological:     Mental Status: She is lethargic.     ED  Results and Treatments Labs (all labs ordered are listed, but only abnormal results are displayed) Labs Reviewed  BASIC METABOLIC PANEL - Abnormal; Notable for the following components:      Result Value   CO2 17 (*)    Glucose, Bld 24 (*)    Creatinine, Ser 2.85 (*)    Calcium 8.1 (*)    GFR, Estimated 18 (*)    All other components within normal limits  CBC - Abnormal; Notable for the following components:   RBC 3.21 (*)    Hemoglobin 9.6 (*)    HCT 29.0 (*)    All other components within normal limits  CBG MONITORING, ED - Abnormal; Notable for the following components:   Glucose-Capillary 27 (*)    All other components within normal limits  CBG MONITORING, ED - Abnormal; Notable for the following components:   Glucose-Capillary 123 (*)    All other components within normal limits  CBG MONITORING, ED - Abnormal; Notable for the following components:   Glucose-Capillary 46 (*)    All other components within normal limits  CBG MONITORING, ED - Abnormal; Notable for the following components:   Glucose-Capillary 42 (*)    All other components within normal limits  URINALYSIS, ROUTINE W REFLEX MICROSCOPIC  EKG  EKG Interpretation  Date/Time:    Ventricular Rate:    PR Interval:    QRS Duration:   QT Interval:    QTC Calculation:   R Axis:     Text Interpretation:         Radiology No results found.  Medications Ordered in ED Medications  sodium chloride flush (NS) 0.9 % injection 3 mL (has no administration in time range)  sodium chloride flush (NS) 0.9 % injection 3 mL (has no administration in time range)  0.9 %  sodium chloride infusion (has no administration in time range)  dextrose 10 % infusion ( Intravenous New Bag/Given 11/01/22 0504)  dextrose 50 % solution (50 mLs  Given 11/01/22 0442)  dextrose 50 % solution 25 mL (25 mLs Intravenous  Given 11/01/22 0706)                                                                                                                                     Procedures .Critical Care  Performed by: Fatima Blank, MD Authorized by: Fatima Blank, MD   Critical care provider statement:    Critical care time (minutes):  45   Critical care time was exclusive of:  Separately billable procedures and treating other patients   Critical care was necessary to treat or prevent imminent or life-threatening deterioration of the following conditions:  Metabolic crisis   Critical care was time spent personally by me on the following activities:  Development of treatment plan with patient or surrogate, discussions with consultants, evaluation of patient's response to treatment, examination of patient, obtaining history from patient or surrogate, review of old charts, re-evaluation of patient's condition, pulse oximetry, ordering and review of radiographic studies, ordering and review of laboratory studies and ordering and performing treatments and interventions   Care discussed with: admitting provider     (including critical care time)  Medical Decision Making / ED Course  Click here for ABCD2, HEART and other calculators  Medical Decision Making Amount and/or Complexity of Data Reviewed Labs: ordered. Decision-making details documented in ED Course.  Risk Prescription drug management. Decision regarding hospitalization.  Hypoglycemia Likely secondary to diabetes medication.  Will need to rule out infectious process. CBG here at 27 She was given D50 and put on D10 drip. Repeat CBG is in 120s.  Patient is more awake and alert.  Denies any recent medication changes.  CBC without leukocytosis.  Anemia with relatively stable hemoglobin Metabolic panel with stable renal function.  No significant electrolyte derangements.  Consistent with her initial hypoglycemia.  On 1 hour CBG  recheck, blood sugar level was noted to be in the 40s.  Patient is still responsive.  Already on D10 at 75 mL/hr.  Given another dose of D50 and food.  Patient will will require admission for further workup and management.       Final Clinical Impression(s) / ED Diagnoses Final  diagnoses:  Hypoglycemia           This chart was dictated using voice recognition software.  Despite best efforts to proofread,  errors can occur which can change the documentation meaning.    Fatima Blank, MD 11/01/22 0730

## 2022-11-01 NOTE — Progress Notes (Signed)
Modified Barium Swallow Study  Patient Details  Name: Carol [name] MRN: [id number] Date of Birth: December 09, 1960  Today's Date: 11/01/2022  Modified Barium Swallow completed.  Full report located under Chart Review in the Imaging Section.  History of Present Illness Carol [name] is a 62 y.o. female who presented to the emergency department for episode of hypoglycemia with BGL 27 on arrival. 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 During oral phase, patient did exhibit anterior to posterior transit delays. Swallow was initiated at level of pyriform sinus with both thin and nectar thick liquids with patient gagging during the swallow and exhibiting belching afterwards. Prominent cricopharyngeal bar observed which did appear to impact bolus transit. No penetration or aspiration observed with thin liquids. With nectar thick liquid via spoon sip, patient started gagging during the swallow, which led to coughing and penetration of nectar, but no aspiration. SLP then ended the study as patient continued to cough and started gasping for air. She did calm down and although she continued to belch and cough, no regurgitation observed. SLP consulted with MD and both in agreement for GI referral but in the meantime, to initiate a PO diet of clear liquids (thin consistency) and meds whole in puree. SLP will follow briefly for toleration. Factors that may increase risk of adverse event in presence of aspiration ([place] 2021):    Swallow Evaluation Recommendations Recommendations: PO diet PO  Diet Recommendation: Thin liquids (Level 0) Liquid Administration via: Cup;Straw Medication Administration: Whole meds with puree Supervision: Patient able to self-feed Swallowing strategies  : Slow rate;Small bites/sips Postural changes: Position pt fully upright for meals;Stay upright 30-60 min after meals Oral care recommendations: Oral care BID (2x/day) Recommended consults: Consider GI consultation      Carol Baller, MA, CCC-SLP Speech Therapy

## 2022-11-01 NOTE — Assessment & Plan Note (Signed)
Continue zoloft '50mg'$  and hydroxyzine prn

## 2022-11-01 NOTE — Assessment & Plan Note (Addendum)
Years long issue of swallowing solid foods, liquids seem to be fine.  She feels like it gets stuck in her throat and then she throws up-nearly daily  If she takes small bites and soft food it helps SLP eval ordered for dysphagia eval  NPO until this and SLP has ordered MBSS Failed, recommend GI eval, consulted [place]

## 2022-11-01 NOTE — Consult Note (Signed)
Consultation Note   Referring Provider:  Emergency Services PCP: Simona Huh, NP Primary Gastroenterologist: Silvano Rusk, MD        Reason for consultation: Dysphagia  Hospital Day: 1   ASSESSMENT    62 yo female with the following:   Diabetes type 2, admitted for hypoglycemia.   Hypomagnesemia  Chronic solid food dysphagia.  Description of symptoms sounds like it is esophageal in nature. May have esophageal dysmotility. 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  Possibly anemia of chronic disease.   Borderline prolonged QT interval  CKD 4  Chronic diastolic heart failure.  BNP 1,401. Pitting edema of BLE  CAD, on plavix + asa   PLAN   -Continue PPI for history of GERD -Offered an EGD to be done once acute medical issues have stabilized.  However, she is on plavix which would limit endoscopic intervention such as an esophageal dilation if warranted.  -Another option would be a barium swallow with tablet.  -Iron studies -At some point she should have a screening colonoscopy ( appears she hasn't had one)     History of Present Illness   Patient is a 62 y.o. year old female with a past medical history of CAD, chronic diastolic heart failure, DM, HTN, CKD 4, anxiety / depression, cocaine use.    See PMH for any additional medical problems.  Patient last seen in our office in 2015. At that time we saw her for elevated LFTs. We referred her to [facility] liver. She was seen there  11/09/14 but note is blurry, I cannot read it.  Her liver enzymes are normal today.   Patient brought to ED today for hypoglycemia. CBG in ED was 27. She takes insulin and oral diabetic meds. In the ED she complained of dysphagia which has been present for years. Weight is stable. She describes problems swallowing meat and bread. They get stuck  in her "throat". This makes her feel like she cannot breathe which causes panic. She sometimes has to force herself to vomit the piece of food. Some of her medications are also difficult swallow. She hasn't ever undergone evaluation of the dysphagia. She has chronic GERD. Heartburn under control with daily pantoprazole  Christye also has chronic constipation but doesn't take anything for it      Recent Labs    11/01/22 0444  WBC 4.8  HGB 9.6*  HCT 29.0*  PLT 186   Recent Labs    11/01/22 0444  NA 136  K 3.7  CL 110  CO2 17*  GLUCOSE 24*  BUN 19  CREATININE 2.85*  CALCIUM 8.1*   Recent Labs    11/01/22 1214  PROT 6.2*  ALBUMIN 2.0*  AST 22  ALT 10  ALKPHOS 124  BILITOT 0.8  BILIDIR 0.2  IBILI 0.6   No results for input(s): "HEPBSAG", "HCVAB", "HEPAIGM", "HEPBIGM" in the last 72 hours. No results for input(s): "LABPROT", "INR" in the last 72 hours.  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   Chronic back pain    Depression    Diabetes mellitus    GERD (gastroesophageal reflux disease)    Heart murmur    Hypertension    NSTEMI (non-ST elevated myocardial infarction) ([place]) 12/20/2021   Palpitations    Parkinson's disease     Past Surgical History:  Procedure Laterality Date   CARPAL TUNNEL RELEASE     IR FLUORO GUIDE CV LINE RIGHT  01/26/2022   IR US GUIDE VASC ACCESS RIGHT  01/26/2022   LEFT HEART CATH AND CORONARY ANGIOGRAPHY N/A 12/20/2021   Procedure: LEFT HEART CATH AND CORONARY ANGIOGRAPHY;  Surgeon: Burnell Blanks, MD;  Location: [place] [facility];  Service: Cardiovascular;  Laterality: N/A;   OPEN REDUCTION INTERNAL FIXATION (ORIF) FINGER WITH RADIAL BONE GRAFT Right 09/12/2015   Procedure: RIGHT MIDDLE FINGER CLOSED REDUCTION AND PINNING;  Surgeon: Iran Planas, MD;   Location: [place];  Service: Orthopedics;  Laterality: Right;   TONSILLECTOMY     TUBAL LIGATION      Family History  Problem Relation Age of Onset   Diabetes Mother    Kidney disease Mother    Seizures Father    Hypertension Father    Stroke Father    Asthma Other     Prior to Admission medications   Medication Sig Start Date End Date Taking? Authorizing Provider  albuterol (PROVENTIL HFA;VENTOLIN HFA) 108 (90 Base) MCG/ACT inhaler Inhale 2 puffs into the lungs every 4 (four) hours as needed for wheezing or shortness of breath.   Yes [provider]  aspirin EC 81 MG tablet Take 1 tablet (81 mg total) by mouth daily. Swallow whole. 05/11/22  Yes Iona Beard, MD  atorvastatin (LIPITOR) 80 MG tablet Take 1 tablet (80 mg total) by mouth daily. 05/11/22  Yes Iona Beard, MD  budesonide-formoterol [facility]) 160-4.5 MCG/ACT inhaler Inhale 2 puffs into the lungs every morning. 11/09/21  Yes [provider]  cetirizine (ZYRTEC) 10 MG tablet Take 10 mg by mouth daily as needed for allergies.   Yes [provider]  clopidogrel (PLAVIX) 75 MG tablet Take 1 tablet (75 mg total) by mouth daily. 05/11/22  Yes Iona Beard, MD  gabapentin (NEURONTIN) 100 MG capsule Take 2 capsules (200 mg total) by mouth 2 (two) times daily. Patient taking differently: Take 100 mg by mouth at bedtime. 05/11/22  Yes Iona Beard, MD  glimepiride (AMARYL) 2 MG tablet Take 2 tablets (4 mg total) by mouth 2 (two) times daily. 05/11/22 11/01/23 Yes Iona Beard, MD  hydrALAZINE (APRESOLINE) 50 MG tablet Take 1 tablet (50 mg total) by mouth 3 (three) times daily. 07/04/22 07/04/23 Yes Milford, Maricela Bo, FNP  hydrOXYzine (ATARAX) 50 MG tablet Take 50 mg by mouth at bedtime. 07/02/22  Yes [provider]  insulin glargine (LANTUS) 100 UNIT/ML Solostar Pen Inject 5 Units into the skin at bedtime. 05/11/22  Yes Iona Beard, MD  metFORMIN (GLUCOPHAGE) 500 MG tablet Take 500 mg by mouth  2 (two) times daily. 07/02/22  Yes [provider]  Nebivolol HCl 20 MG TABS Take 1 tablet (20 mg total) by mouth every morning. 05/11/22  Yes Iona Beard, MD  nitroGLYCERIN (NITROSTAT) 0.4 MG SL tablet Place 1 tablet (0.4 mg total) under the tongue every 5 (five) minutes as needed for chest pain. 05/11/22 11/01/23 Yes Iona Beard, MD  pantoprazole (PROTONIX) 40 MG tablet Take 1 tablet (40 mg total) by mouth daily at  12 noon. 05/11/22  Yes Iona Beard, MD  potassium chloride SA (KLOR-CON M) 10 MEQ tablet Take 1 tablet (10 mEq total) by mouth daily. 07/04/22  Yes Milford, Maricela Bo, FNP  QUEtiapine (SEROQUEL XR) 300 MG 24 hr tablet Take 1 tablet (300 mg total) by mouth at bedtime. 05/11/22 11/01/23 Yes Iona Beard, MD  sertraline (ZOLOFT) 50 MG tablet Take 1 tablet (50 mg total) by mouth every evening. 05/11/22 11/01/23 Yes Iona Beard, MD  torsemide (DEMADEX) 20 MG tablet Take 1 tablet (20 mg total) by mouth daily. 07/04/22  Yes Milford, Maricela Bo, FNP  amLODipine (NORVASC) 10 MG tablet Take 1 tablet (10 mg total) by mouth daily. 11/01/22   Milford, Maricela Bo, FNP  ergocalciferol (VITAMIN D2) 1.25 MG (50000 UT) capsule Take 1 capsule (50,000 Units total) by mouth once a week. Wednesday Patient not taking: Reported on 11/01/2022 05/11/22   Iona Beard, MD  Insulin Pen Needle (PEN NEEDLES) 32G X 4 MM MISC 1 Units by Does not apply route daily. 05/10/22   Iona Beard, MD  isosorbide mononitrate (IMDUR) 30 MG 24 hr tablet Take 1/2 tablet (15 mg total) by mouth daily. 11/01/22   Rafael Bihari, FNP    Current Facility-Administered Medications  Medication Dose Route Frequency Provider Last Rate Last Admin   0.9 %  sodium chloride infusion  250 mL Intravenous PRN Cardama, Grayce Sessions, MD       acetaminophen (TYLENOL) tablet 650 mg  650 mg Oral Q6H PRN Orma Flaming, MD       Or   acetaminophen (TYLENOL) suppository 650 mg  650 mg Rectal Q6H PRN Orma Flaming, MD       albuterol  (PROVENTIL) (2.5 MG/3ML) 0.083% nebulizer solution 2.5 mg  2.5 mg Inhalation Q4H PRN Orma Flaming, MD       amLODipine (NORVASC) tablet 10 mg  10 mg Oral Daily Orma Flaming, MD   10 mg at 11/01/22 0950   aspirin EC tablet 81 mg  81 mg Oral Daily Orma Flaming, MD   81 mg at 11/01/22 0950   atorvastatin (LIPITOR) tablet 80 mg  80 mg Oral Daily Orma Flaming, MD   80 mg at 11/01/22 0949   clopidogrel (PLAVIX) tablet 75 mg  75 mg Oral Daily Orma Flaming, MD   75 mg at 11/01/22 0950   dextrose 10 % infusion   Intravenous Continuous Orma Flaming, MD 50 mL/hr at 11/01/22 1442 Rate Change at 11/01/22 1442   enoxaparin (LOVENOX) injection 30 mg  30 mg Subcutaneous Q24H Orma Flaming, MD   30 mg at 11/01/22 1151   fluticasone furoate-vilanterol (BREO ELLIPTA) 200-25 MCG/ACT 1 puff  1 puff Inhalation Daily Orma Flaming, MD       gabapentin (NEURONTIN) capsule 100 mg  100 mg Oral QHS Orma Flaming, MD       hydrALAZINE (APRESOLINE) injection 10 mg  10 mg Intravenous Q8H PRN Orma Flaming, MD       hydrALAZINE (APRESOLINE) tablet 50 mg  50 mg Oral Q8H Orma Flaming, MD   50 mg at 11/01/22 0950   hydrOXYzine (ATARAX) tablet 50 mg  50 mg Oral QHS Orma Flaming, MD       isosorbide mononitrate (IMDUR) 24 hr tablet 15 mg  15 mg Oral Daily Orma Flaming, MD   15 mg at 11/01/22 0949   magnesium sulfate IVPB 2 g 50 mL  2 g Intravenous Once Orma Flaming, MD       pantoprazole (PROTONIX) injection 40 mg  40 mg Intravenous Daily Orma Flaming, MD       potassium chloride (KLOR-CON M) CR tablet 10 mEq  10 mEq Oral Daily Orma Flaming, MD   10 mEq at 11/01/22 0950   QUEtiapine (SEROQUEL XR) 24 hr tablet 300 mg  300 mg Oral QHS Orma Flaming, MD       sertraline (ZOLOFT) tablet 50 mg  50 mg Oral QPM Orma Flaming, MD       sodium chloride flush (NS) 0.9 % injection 3 mL  3 mL Intravenous Q12H Cardama, Grayce Sessions, MD   3 mL at 11/01/22 0952   sodium chloride flush (NS) 0.9 % injection 3 mL  3 mL  Intravenous PRN Cardama, Grayce Sessions, MD       torsemide [facility]) tablet 20 mg  20 mg Oral Daily Orma Flaming, MD   20 mg at 11/01/22 [id number]   Current Outpatient Medications  Medication Sig Dispense Refill   albuterol (PROVENTIL HFA;VENTOLIN HFA) 108 (90 Base) MCG/ACT inhaler Inhale 2 puffs into the lungs every 4 (four) hours as needed for wheezing or shortness of breath.     aspirin EC 81 MG tablet Take 1 tablet (81 mg total) by mouth daily. Swallow whole. 30 tablet 0   atorvastatin (LIPITOR) 80 MG tablet Take 1 tablet (80 mg total) by mouth daily. 30 tablet 0   budesonide-formoterol (SYMBICORT) 160-4.5 MCG/ACT inhaler Inhale 2 puffs into the lungs every morning.     cetirizine (ZYRTEC) 10 MG tablet Take 10 mg by mouth daily as needed for allergies.     clopidogrel (PLAVIX) 75 MG tablet Take 1 tablet (75 mg total) by mouth daily. 30 tablet 0   gabapentin (NEURONTIN) 100 MG capsule Take 2 capsules (200 mg total) by mouth 2 (two) times daily. (Patient taking differently: Take 100 mg by mouth at bedtime.) 60 capsule 0   glimepiride (AMARYL) 2 MG tablet Take 2 tablets (4 mg total) by mouth 2 (two) times daily. 120 tablet 0   hydrALAZINE (APRESOLINE) 50 MG tablet Take 1 tablet (50 mg total) by mouth 3 (three) times daily. 270 tablet 3   hydrOXYzine (ATARAX) 50 MG tablet Take 50 mg by mouth at bedtime.     insulin glargine (LANTUS) 100 UNIT/ML Solostar Pen Inject 5 Units into the skin at bedtime. 15 mL 0   metFORMIN (GLUCOPHAGE) 500 MG tablet Take 500 mg by mouth 2 (two) times daily.     Nebivolol HCl 20 MG TABS Take 1 tablet (20 mg total) by mouth every morning. 30 tablet 0   nitroGLYCERIN (NITROSTAT) 0.4 MG SL tablet Place 1 tablet (0.4 mg total) under the tongue every 5 (five) minutes as needed for chest pain. 30 tablet 0   pantoprazole (PROTONIX) 40 MG tablet Take 1 tablet (40 mg total) by mouth daily at 12 noon. 30 tablet 0   potassium chloride SA (KLOR-CON M) 10 MEQ tablet Take 1 tablet (10  mEq total) by mouth daily. 30 tablet 3   QUEtiapine (SEROQUEL XR) 300 MG 24 hr tablet Take 1 tablet (300 mg total) by mouth at bedtime. 30 tablet 0   sertraline (ZOLOFT) 50 MG tablet Take 1 tablet (50 mg total) by mouth every evening. 30 tablet 0   torsemide (DEMADEX) 20 MG tablet Take 1 tablet (20 mg total) by mouth daily. 90 tablet 3   amLODipine (NORVASC) 10 MG tablet Take 1 tablet (10 mg total) by mouth daily. 30 tablet 3   ergocalciferol (VITAMIN D2) 1.25 MG (50000  UT) capsule Take 1 capsule (50,000 Units total) by mouth once a week. Wednesday (Patient not taking: Reported on 11/01/2022) 30 capsule 0   Insulin Pen Needle (PEN NEEDLES) 32G X 4 MM MISC 1 Units by Does not apply route daily. 100 each 0   isosorbide mononitrate (IMDUR) 30 MG 24 hr tablet Take 1/2 tablet (15 mg total) by mouth daily. 15 tablet 3    Allergies as of 11/01/2022 - Review Complete 11/01/2022  Allergen Reaction Noted   Penicillins Shortness Of Breath and Other (See Comments) 02/04/2012   Ultram [tramadol] Other (See Comments) 09/05/2019    Social History   Socioeconomic History   Marital status: Single    Spouse name: Not on file   Number of children: 3   Years of education: Not on file   Highest education level: Not on file  Occupational History   Not on file  Tobacco Use   Smoking status: Some Days    Packs/day: 0.25    Years: 3.00    Additional pack years: 0.00    Total pack years: 0.75    Types: Cigarettes   Smokeless tobacco: Never  Vaping Use   Vaping Use: Never used  Substance and Sexual Activity   Alcohol use: Yes    Alcohol/week: 0.0 standard drinks of alcohol    Comment: occasional   Drug use: No    Comment: none for 13 years   Sexual activity: Not on file  Other Topics Concern   Not on file  Social History Narrative   Not on file   Social Determinants of Health   Financial Resource Strain: Low Risk  (04/04/2022)   Overall Financial Resource Strain (CARDIA)    Difficulty of  Paying Living Expenses: Not very hard  Food Insecurity: No Food Insecurity (04/04/2022)   Hunger Vital Sign    Worried About Running Out of Food in the Last Year: Never true    Ran Out of Food in the Last Year: Never true  Transportation Needs: No Transportation Needs (04/04/2022)   PRAPARE - Hydrologist (Medical): No    Lack of Transportation (Non-Medical): No  Physical Activity: Not on file  Stress: Not on file  Social Connections: Not on file  Intimate Partner Violence: Not on file    Review of Systems: All systems reviewed and negative except where noted in HPI.  Physical Exam: Vital signs in last 24 hours: Temp:  [97.3 F (36.3 C)-97.7 F (36.5 C)] 97.7 F (36.5 C) (03/14 1256) Pulse Rate:  [77-104] 96 (03/14 1530) Resp:  [14-27] 14 (03/14 1530) BP: (130-227)/(72-115) 130/98 (03/14 1530) SpO2:  [99 %-100 %] 100 % (03/14 1530) Weight:  [89 kg] 89 kg (03/14 1526) Last BM Date :  (pta)  General:  Alert female in NAD Psych:  Pleasant, cooperative. Normal mood and affect Eyes: Pupils equal Ears:  Normal auditory acuity Nose: No deformity, discharge or lesions Neck:  Supple, no masses felt Lungs:  Clear to auscultation.  Heart:  Regular rate, regular rhythm.  Abdomen:  Soft, nondistended, nontender, active bowel sounds, no masses felt Rectal :  Deferred Msk: Symmetrical without gross deformities.  Neurologic:  Alert, oriented, grossly normal neurologically Extremities : BLE pitting edema Skin:  Intact without significant lesions.    Intake/Output from previous day: No intake/output data recorded. Intake/Output this shift:  No intake/output data recorded.    Principal Problem:   Diabetic hypoglycemia [facility]) Active Problems:   Essential hypertension   CAD  in native artery   Anxiety with depression   CKD (chronic kidney disease) stage 4, GFR 15-29 ml/min (HCC)   cocaine abuse   borderline Prolonged QT interval   Chronic diastolic  CHF (congestive heart failure) ([place])   Dysphagia    Tye Savoy, NP-C @  11/01/2022, 4:14 PM

## 2022-11-01 NOTE — ED Notes (Signed)
Dr. Rogers Blocker informed of bg of 58. Pt remains AxOx4. D50 ordered

## 2022-11-01 NOTE — Assessment & Plan Note (Addendum)
Optimize electrolytes Check magnesium and replete if needed  Keep on telemetry Avoid qt prolonging drugs  Repeat ekg in AM

## 2022-11-01 NOTE — Assessment & Plan Note (Signed)
Baseline creatinine around 2.9-3.0 Stable, continue to monitor

## 2022-11-01 NOTE — ED Notes (Signed)
Pt asked for pure wick , bed was wet and new linen was placed on the bed by NT

## 2022-11-01 NOTE — Progress Notes (Signed)
Pt arrived to the floor around 1845 via stretcher from the ED. Pt alert and oriented x4 in no acute distress. Pt is noted to be drowsy. VSS. Respirations even and unlabored on room air. Pt oriented to room. Pt instructed on call bell. Call bell within reach. Bed in low position and bed alarm on.

## 2022-11-01 NOTE — ED Notes (Signed)
MD notified of blood glucose of 42. D10 increased to 100 per hour.

## 2022-11-01 NOTE — ED Notes (Signed)
Pts BGL 27, primary RN made aware. Unable to find MD. Charge RN also aware.

## 2022-11-01 NOTE — ED Notes (Signed)
Meal and orange juice provided to patient

## 2022-11-01 NOTE — Evaluation (Signed)
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   Chronic back pain    Depression    Diabetes mellitus    GERD (gastroesophageal reflux disease)    Heart murmur    Hypertension    NSTEMI (non-ST elevated myocardial infarction) ([place]) 12/20/2021   Palpitations    Parkinson's disease    Past Surgical History:  Past Surgical History:  Procedure Laterality Date   CARPAL TUNNEL RELEASE     IR FLUORO GUIDE CV LINE RIGHT  01/26/2022   IR US GUIDE VASC ACCESS RIGHT  01/26/2022   LEFT HEART CATH AND CORONARY ANGIOGRAPHY N/A 12/20/2021   Procedure: LEFT HEART CATH AND CORONARY ANGIOGRAPHY;  Surgeon: Burnell Blanks, MD;  Location: [place] [facility];  Service: Cardiovascular;  Laterality: N/A;   OPEN REDUCTION INTERNAL FIXATION (ORIF) FINGER WITH RADIAL BONE GRAFT Right 09/12/2015   Procedure: RIGHT MIDDLE FINGER CLOSED REDUCTION AND PINNING;  Surgeon: Iran Planas, MD;  Location: [place];  Service: Orthopedics;  Laterality: Right;   TONSILLECTOMY     TUBAL LIGATION     HPI:  Carol [name] is a 62 y.o. female who presented to the emergency department for episode of hypoglycemia with BGL 27 on arrival. 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  Pt reports difficulty swallowing, especially pills.  With single sip of thin liquid there was wet vocal quality, prologed coughing, watery eyes, and episode of stridor.  Pt give puree with decreased severity of symptoms, but pt still exhibited difficulty.  She reports she eats very little.  It is unclear if she has had a recent weightloss. Pt's SO confirms difficulty with POs at home.  Recommend MBS for further assessment of swallow function.  Recommend have nothing else to eat or drink pending instrumental evaluation.  MBS planned for later this date (1330).    Recommend pt remain NPO at present pending results of instrumental.  SLP Visit Diagnosis: Dysphagia, unspecified (R13.10)    Aspiration Risk  Moderate aspiration risk    Diet Recommendation NPO   Medication Administration: Via alternative means    Other  Recommendations      Recommendations for follow up therapy are one component of a multi-disciplinary discharge planning process, led by the attending physician.  Recommendations may be updated based on patient status, additional functional criteria and insurance authorization.  Follow up Recommendations  (TBD)      Assistance Recommended at Discharge  N/A  Functional Status Assessment Patient has had a recent decline in their functional status and demonstrates the ability to make significant improvements in function in a reasonable and predictable amount of time. (TBD)  Frequency and Duration min 2x/week  2 weeks       Prognosis Prognosis for improved oropharyngeal function:  (  TBD)      Swallow Study   General Date of Onset: 11/01/22 HPI: Carol [name] is a 62 y.o. female who presented to the emergency department for episode of hypoglycemia with BGL 27 on arrival. Pt with PMHx Abnormal liver function tests, Acute kidney injury 2015, Anxiety, Chronic active  hepatitis with granulomas 01/29/2014, Chronic back pain, Depression, Diabetes mellitus, GERD (gastroesophageal reflux disease), Heart murmur, Hypertension, NSTEMI 12/20/2021, Palpitations, Parkinson's disease Type of Study: Bedside Swallow Evaluation Previous Swallow Assessment: none Diet Prior to this Study: Regular Temperature Spikes Noted: No Respiratory Status: Room air History of Recent Intubation: No Behavior/Cognition: Alert;Cooperative;Pleasant mood Oral Cavity Assessment: Within Functional Limits Oral Care Completed by SLP: No Oral Cavity - Dentition: Missing dentition Patient Positioning: Upright in bed Baseline Vocal Quality: Normal Volitional Cough: Other (Comment) (Pt states she cannot cough or spit and that she was born that way) Volitional Swallow: Able to elicit    Oral/Motor/Sensory Function Overall Oral Motor/Sensory Function: Within functional limits Facial ROM: Within Functional Limits Facial Symmetry: Within Functional Limits Lingual ROM: Within Functional Limits Lingual Symmetry: Within Functional Limits Lingual Strength: Within Functional Limits Velum: Within Functional Limits Mandible: Within Functional Limits   Ice Chips Ice chips: Not tested   Thin Liquid Thin Liquid: Impaired Presentation: Straw Pharyngeal  Phase Impairments: Wet Vocal Quality;Cough - Immediate;Multiple swallows (stridor)    Nectar Thick Nectar Thick Liquid: Not tested   Honey Thick Honey Thick Liquid: Not tested   Puree Puree: Impaired Presentation: Spoon Pharyngeal Phase Impairments: Cough - Immediate;Multiple swallows;Wet Vocal Quality   Solid     Solid: Not tested      Celedonio Savage, [place], [place] Office: [phone] 11/01/2022,11:33 AM

## 2022-11-01 NOTE — Assessment & Plan Note (Addendum)
62 year old female who presented to ED with AMS in setting of recurrent hypoglycemia in a type 2 diabetic on insulin  -obs to progressive -continue D10, increase rate to 100cc/hour with low reading at 75cc/hour  -A1C 6.5 in 04/2022, repeat pending -CBG q 2 hours until 3 consistent readings >120 then will wean off -no signs of infection at this point.  -likely due to self adjusting insulin (increasing to 12 units) and not eating while doing crack cocaine, +hx of dysphagia with poor PO intake.  -Unsure why on glizide with recurrent hypoglycemia with A1C of 6.5. would not re start this at d/c. Clearly hold while inpatient -Patient with CKD stage IV, unsure if still on metformin but now with lactic acidosis, would not re start this -Home insulin regimen includes lantus 5 units QHS but she is taking 12 units on her own accord -Decreased PO intake in setting of complaints of dysphagia and food getting stuck  -She also states when she smokes crack cocaine she doesn't eat at all during the day

## 2022-11-01 NOTE — ED Notes (Signed)
This RN received call from lab; Glucose on sample sent prior is 24.  Glucose was treated at time of check in department.

## 2022-11-01 NOTE — Assessment & Plan Note (Addendum)
Echo in 12/2021 showed normal EF with grade 1 DD.  Appears dry to euvolemic Strict I/O and daily weights  Continue Demadex '20mg'$  daily

## 2022-11-02 ENCOUNTER — Observation Stay (HOSPITAL_COMMUNITY): Payer: 59

## 2022-11-02 DIAGNOSIS — R1319 Other dysphagia: Secondary | ICD-10-CM | POA: Diagnosis not present

## 2022-11-02 DIAGNOSIS — E041 Nontoxic single thyroid nodule: Secondary | ICD-10-CM | POA: Diagnosis present

## 2022-11-02 DIAGNOSIS — G9341 Metabolic encephalopathy: Secondary | ICD-10-CM | POA: Diagnosis present

## 2022-11-02 DIAGNOSIS — I130 Hypertensive heart and chronic kidney disease with heart failure and stage 1 through stage 4 chronic kidney disease, or unspecified chronic kidney disease: Secondary | ICD-10-CM | POA: Diagnosis present

## 2022-11-02 DIAGNOSIS — R1310 Dysphagia, unspecified: Secondary | ICD-10-CM | POA: Diagnosis not present

## 2022-11-02 DIAGNOSIS — E875 Hyperkalemia: Secondary | ICD-10-CM | POA: Diagnosis present

## 2022-11-02 DIAGNOSIS — F331 Major depressive disorder, recurrent, moderate: Secondary | ICD-10-CM | POA: Diagnosis present

## 2022-11-02 DIAGNOSIS — K922 Gastrointestinal hemorrhage, unspecified: Secondary | ICD-10-CM | POA: Diagnosis present

## 2022-11-02 DIAGNOSIS — K224 Dyskinesia of esophagus: Secondary | ICD-10-CM | POA: Diagnosis not present

## 2022-11-02 DIAGNOSIS — N184 Chronic kidney disease, stage 4 (severe): Secondary | ICD-10-CM | POA: Diagnosis present

## 2022-11-02 DIAGNOSIS — E1122 Type 2 diabetes mellitus with diabetic chronic kidney disease: Secondary | ICD-10-CM | POA: Diagnosis present

## 2022-11-02 DIAGNOSIS — F810 Specific reading disorder: Secondary | ICD-10-CM | POA: Diagnosis present

## 2022-11-02 DIAGNOSIS — R58 Hemorrhage, not elsewhere classified: Secondary | ICD-10-CM | POA: Diagnosis not present

## 2022-11-02 DIAGNOSIS — Z7902 Long term (current) use of antithrombotics/antiplatelets: Secondary | ICD-10-CM | POA: Diagnosis not present

## 2022-11-02 DIAGNOSIS — R933 Abnormal findings on diagnostic imaging of other parts of digestive tract: Secondary | ICD-10-CM

## 2022-11-02 DIAGNOSIS — D500 Iron deficiency anemia secondary to blood loss (chronic): Secondary | ICD-10-CM | POA: Diagnosis present

## 2022-11-02 DIAGNOSIS — I2510 Atherosclerotic heart disease of native coronary artery without angina pectoris: Secondary | ICD-10-CM | POA: Diagnosis present

## 2022-11-02 DIAGNOSIS — E669 Obesity, unspecified: Secondary | ICD-10-CM | POA: Diagnosis present

## 2022-11-02 DIAGNOSIS — E11649 Type 2 diabetes mellitus with hypoglycemia without coma: Secondary | ICD-10-CM | POA: Diagnosis present

## 2022-11-02 DIAGNOSIS — F418 Other specified anxiety disorders: Secondary | ICD-10-CM

## 2022-11-02 DIAGNOSIS — R9431 Abnormal electrocardiogram [ECG] [EKG]: Secondary | ICD-10-CM

## 2022-11-02 DIAGNOSIS — Z794 Long term (current) use of insulin: Secondary | ICD-10-CM | POA: Diagnosis not present

## 2022-11-02 DIAGNOSIS — F17210 Nicotine dependence, cigarettes, uncomplicated: Secondary | ICD-10-CM | POA: Diagnosis present

## 2022-11-02 DIAGNOSIS — Z79899 Other long term (current) drug therapy: Secondary | ICD-10-CM | POA: Diagnosis not present

## 2022-11-02 DIAGNOSIS — K222 Esophageal obstruction: Secondary | ICD-10-CM | POA: Diagnosis present

## 2022-11-02 DIAGNOSIS — I252 Old myocardial infarction: Secondary | ICD-10-CM | POA: Diagnosis not present

## 2022-11-02 DIAGNOSIS — E162 Hypoglycemia, unspecified: Principal | ICD-10-CM | POA: Diagnosis present

## 2022-11-02 DIAGNOSIS — Z1211 Encounter for screening for malignant neoplasm of colon: Secondary | ICD-10-CM | POA: Diagnosis not present

## 2022-11-02 DIAGNOSIS — D8689 Sarcoidosis of other sites: Secondary | ICD-10-CM | POA: Diagnosis present

## 2022-11-02 DIAGNOSIS — R195 Other fecal abnormalities: Secondary | ICD-10-CM | POA: Diagnosis not present

## 2022-11-02 DIAGNOSIS — N179 Acute kidney failure, unspecified: Secondary | ICD-10-CM | POA: Diagnosis present

## 2022-11-02 DIAGNOSIS — F1410 Cocaine abuse, uncomplicated: Secondary | ICD-10-CM | POA: Diagnosis present

## 2022-11-02 DIAGNOSIS — I5033 Acute on chronic diastolic (congestive) heart failure: Secondary | ICD-10-CM | POA: Diagnosis present

## 2022-11-02 LAB — CBC
HCT: 30.9 % — ABNORMAL LOW (ref 36.0–46.0)
Hemoglobin: 9.9 g/dL — ABNORMAL LOW (ref 12.0–15.0)
MCH: 28.9 pg (ref 26.0–34.0)
MCHC: 32 g/dL (ref 30.0–36.0)
MCV: 90.1 fL (ref 80.0–100.0)
Platelets: 211 10*3/uL (ref 150–400)
RBC: 3.43 MIL/uL — ABNORMAL LOW (ref 3.87–5.11)
RDW: 13.7 % (ref 11.5–15.5)
WBC: 4.6 10*3/uL (ref 4.0–10.5)
nRBC: 0 % (ref 0.0–0.2)

## 2022-11-02 LAB — BASIC METABOLIC PANEL
Anion gap: 9 (ref 5–15)
BUN: 20 mg/dL (ref 6–20)
CO2: 17 mmol/L — ABNORMAL LOW (ref 22–32)
Calcium: 8.3 mg/dL — ABNORMAL LOW (ref 8.9–10.3)
Chloride: 106 mmol/L (ref 98–111)
Creatinine, Ser: 2.76 mg/dL — ABNORMAL HIGH (ref 0.44–1.00)
GFR, Estimated: 19 mL/min — ABNORMAL LOW (ref >60)
Glucose, Bld: 92 mg/dL (ref 70–99)
Potassium: 4 mmol/L (ref 3.5–5.1)
Sodium: 132 mmol/L — ABNORMAL LOW (ref 135–145)

## 2022-11-02 LAB — GLUCOSE, CAPILLARY
Glucose-Capillary: 100 mg/dL — ABNORMAL HIGH (ref 70–99)
Glucose-Capillary: 105 mg/dL — ABNORMAL HIGH (ref 70–99)
Glucose-Capillary: 114 mg/dL — ABNORMAL HIGH (ref 70–99)
Glucose-Capillary: 118 mg/dL — ABNORMAL HIGH (ref 70–99)
Glucose-Capillary: 127 mg/dL — ABNORMAL HIGH (ref 70–99)
Glucose-Capillary: 141 mg/dL — ABNORMAL HIGH (ref 70–99)
Glucose-Capillary: 258 mg/dL — ABNORMAL HIGH (ref 70–99)
Glucose-Capillary: 302 mg/dL — ABNORMAL HIGH (ref 70–99)
Glucose-Capillary: 322 mg/dL — ABNORMAL HIGH (ref 70–99)
Glucose-Capillary: 337 mg/dL — ABNORMAL HIGH (ref 70–99)
Glucose-Capillary: 97 mg/dL (ref 70–99)

## 2022-11-02 LAB — HEMOGLOBIN A1C
Hgb A1c MFr Bld: 6.2 % — ABNORMAL HIGH (ref 4.8–5.6)
Mean Plasma Glucose: 131 mg/dL

## 2022-11-02 LAB — MAGNESIUM: Magnesium: 1.8 mg/dL (ref 1.7–2.4)

## 2022-11-02 MED ORDER — LIDOCAINE 5 % EX PTCH
1.0000 | MEDICATED_PATCH | CUTANEOUS | Status: DC
  Administered 2022-11-02 – 2022-11-06 (×5): 1 via TRANSDERMAL
  Filled 2022-11-02 (×5): qty 1

## 2022-11-02 MED ORDER — INSULIN ASPART 100 UNIT/ML IJ SOLN
0.0000 [IU] | Freq: Three times a day (TID) | INTRAMUSCULAR | Status: DC
  Administered 2022-11-02: 7 [IU] via SUBCUTANEOUS

## 2022-11-02 MED ORDER — TRAZODONE HCL 50 MG PO TABS
25.0000 mg | ORAL_TABLET | Freq: Every evening | ORAL | Status: DC | PRN
  Administered 2022-11-03: 25 mg via ORAL
  Filled 2022-11-02: qty 1

## 2022-11-02 MED ORDER — INSULIN ASPART 100 UNIT/ML IJ SOLN: 0.0000 [IU] | Freq: Three times a day (TID) | INTRAMUSCULAR | Status: DC

## 2022-11-02 MED ORDER — DOCUSATE SODIUM 100 MG PO CAPS
100.0000 mg | ORAL_CAPSULE | Freq: Two times a day (BID) | ORAL | Status: DC | PRN
  Administered 2022-11-03: 100 mg via ORAL
  Filled 2022-11-02: qty 1

## 2022-11-02 MED ORDER — MAGNESIUM SULFATE 2 GM/50ML IV SOLN
2.0000 g | Freq: Once | INTRAVENOUS | Status: AC
  Administered 2022-11-02: 2 g via INTRAVENOUS
  Filled 2022-11-02: qty 50

## 2022-11-02 MED ORDER — MIRTAZAPINE 15 MG PO TABS
7.5000 mg | ORAL_TABLET | Freq: Every day | ORAL | Status: DC
  Administered 2022-11-02 – 2022-11-06 (×5): 7.5 mg via ORAL
  Filled 2022-11-02 (×6): qty 1

## 2022-11-02 NOTE — Progress Notes (Signed)
PROGRESS NOTE        PATIENT DETAILS Name: Carol [name] Age: 62 y.o. Sex: female Date of Birth: [birth year] Admit Date: 11/01/2022 Admitting Physician Orma Flaming, MD [id number], [name], NP  Brief Summary: Patient is a 62 y.o.  female chronic HFpEF, CAD-being medically managed, DM-2, HTN, stage IV CKD, crack cocaine use-presented with acute metabolic encephalopathy in the setting of hypoglycemia.  Patient also acknowledged solid food dysphagia for close to a year.  She was subsequently admitted to the hospitalist service.  Significant events: 3/14>> admit to [facility]  Significant studies: None  Significant microbiology data: None  Procedures: None  Consults: GI Psych  Subjective: Sleeping when I saw her this morning.  Awoke-answer most of my questions appropriately.  Objective: Vitals: Blood pressure (!) 170/94, pulse 88, temperature 97.9 F (36.6 C), temperature source Oral, resp. rate 18, height 5\' 4"  (1.626 m), weight 90.6 kg, SpO2 98 %.   Exam: Gen Exam:Alert awake-not in any distress HEENT:atraumatic, normocephalic Chest: B/L clear to auscultation anteriorly CVS:S1S2 regular Abdomen:soft non tender, non distended Extremities:no edema Neurology: Non focal Skin: no rash  Pertinent Labs/Radiology:    Latest Ref Rng & Units 11/02/2022    8:06 AM 11/01/2022    4:44 AM 07/22/2022    5:04 AM  CBC  WBC 4.0 - 10.5 K/uL 4.6  4.8  4.7   Hemoglobin 12.0 - 15.0 g/dL 9.9  9.6  10.8   Hematocrit 36.0 - 46.0 % 30.9  29.0  31.4   Platelets 150 - 400 K/uL 211  186  222     Lab Results  Component Value Date   NA 136 11/01/2022   K 3.7 11/01/2022   CL 110 11/01/2022   CO2 17 (L) 11/01/2022      Assessment/Plan: DM-2 with hypoglycemia (A1C6.2 on 3/14) Recurrent issue-similar hospitalization December 2023 Home regimen includes Lantus 5 units daily (but she is taking 12 units daily), metformin and glipizide.  Given severity of  hypoglycemia-suspect can stop glipizide and Lantus and just maintain on metformin. In the meantime-continue D10 infusion-till oral intake resumes (getting worked up for solid food dysphagia-see below) Needs extensive diabetic education-Per outpatient cardiology note-patient has limited reading ability-finish 12 grade but did not pass-she apparently was in special needs classes.  This may be the reason why she has recurrent hypoglycemia.   Recent Labs    11/02/22 0408 11/02/22 0552 11/02/22 0752  GLUCAP 114* 105* 100*     Dysphagia Mostly to solids-ongoing x 1 year Barium esophagogram scheduled for today GI following for potential EGD during this hospitalization  CAD with history of non-STEMI 4/23 (LHC with occluded small sub-branch of PDA-treated medically) No anginal symptoms currently Hold Plavix-as patient may need EGD/dilatation. Continue aspirin/statin Suspect not on beta-blocker given history of active cocaine use  Chronic HFpEF Euvolemic Continue Demadex  Prolonged QTc interval Telemetry monitoring Replete Mg Keep K> 4, Mg> 2 Repeat twelve-lead EKG tomorrow morning  Hypomagnesemia Continue to replete Mg  HTN BP stable Continue Imdur/hydralazine/amlodipine  CKD 4 At baseline Follow electrolytes  History of liver sarcoidosis Stable LFTs Resume outpatient follow-up with GI  Anxiety/depression Stable Scheduled Zoloft/ Seroquel-as needed hydroxyzine Given issues with QTc-will consult psych- see if we can use alternative medications.  Obesity: Estimated body mass index is 34.28 kg/m as calculated from the following:   Height as of this encounter: 5\' 4"  (  1.626 m).   Weight as of this encounter: 90.6 kg.   Code status:   Code Status: Full Code   DVT Prophylaxis: enoxaparin (LOVENOX) injection 30 mg Start: 11/01/22 1045 Place TED hose Start: 11/01/22 1007    Family Communication: None at bedside   Disposition Plan: Status is: Observation The  patient will require care spanning > 2 midnights and should be moved to inpatient because: Severity of illness   Planned Discharge Destination:Home   Diet: Diet Order             Diet clear liquid Room service appropriate? Yes; Fluid consistency: Thin  Diet effective now                     Antimicrobial agents: Anti-infectives (From admission, onward)    None        MEDICATIONS: Scheduled Meds:  amLODipine  10 mg Oral Daily   aspirin EC  81 mg Oral Daily   atorvastatin  80 mg Oral Daily   enoxaparin (LOVENOX) injection  30 mg Subcutaneous Q24H   fluticasone furoate-vilanterol  1 puff Inhalation Daily   gabapentin  100 mg Oral QHS   hydrALAZINE  50 mg Oral Q8H   hydrOXYzine  50 mg Oral QHS   isosorbide mononitrate  15 mg Oral Daily   pantoprazole (PROTONIX) IV  40 mg Intravenous Daily   potassium chloride  10 mEq Oral Daily   QUEtiapine  300 mg Oral QHS   sertraline  50 mg Oral QPM   sodium chloride flush  3 mL Intravenous Q12H   torsemide  20 mg Oral Daily   Continuous Infusions:  sodium chloride     dextrose 50 mL/hr at 11/02/22 0403   PRN Meds:.sodium chloride, acetaminophen **OR** acetaminophen, albuterol, hydrALAZINE, sodium chloride flush   I have personally reviewed following labs and imaging studies  LABORATORY DATA: CBC: Recent Labs  Lab 11/01/22 0444 11/02/22 0806  WBC 4.8 4.6  HGB 9.6* 9.9*  HCT 29.0* 30.9*  MCV 90.3 90.1  PLT 186 [id number]    Basic Metabolic Panel: Recent Labs  Lab 11/01/22 0444 11/01/22 1214  NA 136  --   K 3.7  --   CL 110  --   CO2 17*  --   GLUCOSE 24*  --   BUN 19  --   CREATININE 2.85*  --   CALCIUM 8.1*  --   MG  --  1.5*    GFR: Estimated Creatinine Clearance: 22.9 mL/min (A) (by C-G formula based on SCr of 2.85 mg/dL (H)).  Liver Function Tests: Recent Labs  Lab 11/01/22 1214  AST 22  ALT 10  ALKPHOS 124  BILITOT 0.8  PROT 6.2*  ALBUMIN 2.0*   No results for input(s): "LIPASE", "AMYLASE"  in the last 168 hours. No results for input(s): "AMMONIA" in the last 168 hours.  Coagulation Profile: No results for input(s): "INR", "PROTIME" in the last 168 hours.  Cardiac Enzymes: No results for input(s): "CKTOTAL", "CKMB", "CKMBINDEX", "TROPONINI" in the last 168 hours.  BNP (last 3 results) No results for input(s): "PROBNP" in the last 8760 hours.  Lipid Profile: No results for input(s): "CHOL", "HDL", "LDLCALC", "TRIG", "CHOLHDL", "LDLDIRECT" in the last 72 hours.  Thyroid Function Tests: Recent Labs    11/01/22 0911  TSH 0.646    Anemia Panel: Recent Labs    11/01/22 0911 11/01/22 1855  VITAMINB12 523  --   FERRITIN  --  159  TIBC  --  203*  IRON  --  76    Urine analysis:    Component Value Date/Time   COLORURINE YELLOW 11/01/2022 0345   APPEARANCEUR HAZY (A) 11/01/2022 0345   LABSPEC 1.004 (L) 11/01/2022 0345   PHURINE 7.0 11/01/2022 0345   GLUCOSEU NEGATIVE 11/01/2022 0345   HGBUR NEGATIVE 11/01/2022 0345   BILIRUBINUR NEGATIVE 11/01/2022 0345   KETONESUR NEGATIVE 11/01/2022 0345   PROTEINUR >=300 (A) 11/01/2022 0345   UROBILINOGEN 0.2 01/29/2014 2116   NITRITE NEGATIVE 11/01/2022 0345   LEUKOCYTESUR MODERATE (A) 11/01/2022 0345    Sepsis Labs: Lactic Acid, Venous    Component Value Date/Time   LATICACIDVEN 1.2 05/09/2022 1154    MICROBIOLOGY: No results found for this or any previous visit (from the past 240 hour(s)).  RADIOLOGY STUDIES/RESULTS: DG Swallowing Func-Speech Pathology  Result Date: 11/01/2022 Table formatting from the original result was not included. Modified Barium Swallow Study Patient Details Name: Carol [name] MRN: [id number] Date of Birth: March 05, 1961 Today's Date: 11/01/2022 HPI/PMH: HPI: Mackinzi Goble is a 62 y.o. female who presented to the emergency department for episode of hypoglycemia with BGL 27 on arrival. 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 During oral phase, patient did exhibit anterior to posterior transit delays. Swallow was initiated at level of pyriform sinus with both thin and nectar thick liquids with patient gagging during the swallow and exhibiting belching afterwards. Prominent cricopharyngeal bar observed which did appear to impact bolus transit. No penetration or aspiration observed with thin liquids. With nectar thick liquid via spoon sip, patient started gagging during the swallow, which led to coughing and penetration of nectar, but no aspiration. SLP then ended the study as patient continued to cough and started gasping for air. She did calm down and although she continued to belch and cough, no regurgitation observed. SLP consulted with MD and both in agreement for GI referral but in the meantime, to initiate a PO diet of clear liquids (thin consistency) and meds whole in puree. SLP will follow briefly for toleration. Factors that may increase risk of adverse event in presence of aspiration ([place] 2021): No data recorded Recommendations/Plan: Swallowing Evaluation Recommendations Swallowing Evaluation Recommendations Recommendations: PO diet PO Diet Recommendation: Thin liquids (Level 0) Liquid Administration via: Cup; Straw Medication Administration: Whole meds with puree Supervision: Patient able to self-feed Swallowing strategies  : Slow rate; Small bites/sips Postural changes: Position pt fully upright for meals; Stay upright 30-60 min after meals Oral care recommendations: Oral care BID (2x/day) Recommended consults: Consider GI consultation Treatment  Plan Treatment Plan Treatment recommendations: Therapy as outlined in treatment plan below Follow-up recommendations: Follow physicians's recommendations for discharge plan and follow up therapies Functional status assessment: Patient has had a recent decline in their functional status and demonstrates the ability to make significant improvements in function in a reasonable and predictable amount of time. Treatment frequency: Min 1x/week Treatment duration: 1 week Interventions: Patient/family education; Diet toleration management by SLP Recommendations Recommendations for follow up therapy are one component of a multi-disciplinary discharge planning process, led by the attending physician.  Recommendations may be updated based on patient status, additional functional criteria and insurance authorization. Assessment: Orofacial Exam: Orofacial Exam Oral Cavity:  Oral Hygiene: [facility] Oral Cavity - Dentition: Missing dentition Orofacial Anatomy: WFL Oral Motor/Sensory Function: WFL Anatomy: Anatomy: WFL Thin Liquids: Thin Liquids (Level 0) Thin Liquids : Impaired Bolus delivery method: Cup; Spoon Thin Liquid - Impairment: Oral Impairment; Pharyngeal impairment Lip Closure: No labial escape Tongue control during bolus hold: Cohesive bolus between tongue to palatal seal Bolus transport/lingual motion: Slow tongue motion Oral residue: Complete oral clearance Location of oral residue : N/A Initiation of swallow : Pyriform sinuses Soft palate elevation: No bolus between soft palate (SP)/pharyngeal wall (PW) Laryngeal elevation: Complete superior movement of thyroid cartilage with complete approximation of arytenoids to epiglottic petiole Anterior hyoid excursion: Complete Epiglottic movement: Complete Laryngeal vestibule closure: Complete, no air/contrast in laryngeal vestibule Pharyngeal stripping wave : Present - complete Pharyngeal contraction (A/P view only): N/A Pharyngoesophageal segment opening: Partial  distention/partial duration, partial obstruction of flow Tongue base retraction: No contrast between tongue base and posterior pharyngeal wall (PPW) Pharyngeal residue: Complete pharyngeal clearance Location of pharyngeal residue: N/A Penetration/Aspiration Scale (PAS) score: 1.  Material does not enter airway  Mildly Thick Liquids: Mildly thick liquids (Level 2, nectar thick) Mildly thick liquids (Level 2, nectar thick): Impaired Bolus delivery method: Spoon Mildly Thick Liquid - Impairment: Pharyngeal impairment Initiation of swallow : Pyriform sinuses Soft palate elevation: No bolus between soft palate (SP)/pharyngeal wall (PW) Laryngeal elevation: Complete superior movement of thyroid cartilage with complete approximation of arytenoids to epiglottic petiole Anterior hyoid excursion: Complete Epiglottic movement: Complete Laryngeal vestibule closure: Complete, no air/contrast in laryngeal vestibule Pharyngeal stripping wave : Present - complete Pharyngeal contraction (A/P view only): N/A Pharyngoesophageal segment opening: Partial distention/partial duration, partial obstruction of flow Tongue base retraction: No contrast between tongue base and posterior pharyngeal wall (PPW) Pharyngeal residue: Complete pharyngeal clearance Location of pharyngeal residue: N/A Penetration/Aspiration Scale (PAS) score: 2.  Material enters airway, remains ABOVE vocal cords then ejected out  Moderately Thick Liquids: Moderately thick liquids (Level 3, honey thick) Moderately thick liquids (Level 3, honey thick): Not Tested  Puree: Puree Puree: Not Tested Solid: Solid Solid: Not Tested Pill: Pill Pill: Not Tested Compensatory Strategies: No data recorded  General Information: Caregiver present: No  Diet Prior to this Study: NPO   Temperature : Normal   Respiratory Status: WFL   Supplemental O2: None (Room air)   History of Recent Intubation: No  Behavior/Cognition: Alert; Cooperative Self-Feeding Abilities: Able to self-feed  Baseline vocal quality/speech: Normal No data recorded Volitional Swallow: Able to elicit Exam Limitations: Other (comment) (gagging of PO's) Goal Planning: Prognosis for improved oropharyngeal function: Fair Barriers to Reach Goals: Time post onset; Severity of deficits No data recorded Patient/Family Stated Goal: c/o diffuculty swallowing Consulted and agree with results and recommendations: Patient Pain: Pain Assessment Pain Assessment: No/denies pain Faces Pain Scale: 0 End of Session: Start Time:SLP Start Time (ACUTE ONLY): 1330 Stop Time: SLP Stop Time (ACUTE ONLY): 1350 Time Calculation:SLP Time Calculation (min) (ACUTE ONLY): 20 min Charges: SLP Evaluations $ SLP Speech Visit: 1 Visit SLP Evaluations $BSS Swallow: 1 Procedure $MBS Swallow: 1 Procedure SLP visit diagnosis: SLP Visit Diagnosis: Dysphagia, pharyngoesophageal phase (R13.14) Past Medical History: Past Medical History: Diagnosis Date  Abnormal liver function tests   Acute kidney injury ([place]) 01/2014  Hospitalized, Volume depletion, nausea and vomiting  Anxiety   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  Chronic back pain   Depression   Diabetes mellitus   GERD (gastroesophageal reflux disease)   Heart murmur   Hypertension   NSTEMI (non-ST elevated myocardial  infarction) ([place]) 12/20/2021  Palpitations   Parkinson's disease  Past Surgical History: Past Surgical History: Procedure Laterality Date  CARPAL TUNNEL RELEASE    IR FLUORO GUIDE CV LINE RIGHT  01/26/2022  IR US GUIDE VASC ACCESS RIGHT  01/26/2022  LEFT HEART CATH AND CORONARY ANGIOGRAPHY N/A 12/20/2021  Procedure: LEFT HEART CATH AND CORONARY ANGIOGRAPHY;  Surgeon: Burnell Blanks, MD;  Location: [facility];  Service: Cardiovascular;  Laterality: N/A;  OPEN REDUCTION INTERNAL FIXATION (ORIF) FINGER WITH RADIAL BONE GRAFT Right 09/12/2015  Procedure: RIGHT MIDDLE FINGER CLOSED REDUCTION AND PINNING;  Surgeon: Iran Planas, MD;  Location: [place];  Service: Orthopedics;   Laterality: Right;  TONSILLECTOMY    TUBAL LIGATION   Sonia Baller, MA, CCC-SLP Speech Therapy     LOS: 0 days   Oren Binet, MD  [facility]    To contact the attending provider between 7A-7P or the covering provider during after hours 7P-7A, please log into the web site [email or web address] and access using universal [place] password for that web site. If you do not have the password, please call the hospital operator.  11/02/2022, 9:11 AM

## 2022-11-02 NOTE — Consult Note (Addendum)
[facility] Face-to-Face Psychiatric Evaluation   Service Date: November 02, 2022 LOS:  LOS: 0 days  Reason for Consult: "med management-hx of mood d/o-qtc prolonged-?alternate meds" Consult by: Oren Binet, MD  Assessment  Carol [name] is a 62 y.o. female HFpEF, CAD-being medically managed, DM-2, HTN, stage IV CKD, crack cocaine use admitted medically for 11/01/2022  3:25 AM for acute metabolic encephalopathy secondary to hypoglycemia.   Based on initial assessment, patient meets criteria for major depressive disorder and substance-induced mood disorder.  Patient endorses vague symptoms of mania but is unclear whether this was in the context of cocaine use.  She also endorses some auditory/visual hallucinations but has not recently.  Patient states that she only takes Seroquel once a week so we will discontinue it and switch her to mirtazapine as she does endorse depressed mood and insomnia.  Patient has literacy at elementary/middle school level. Has a nurse that previously helped with preparing pill box. Nurse is unavailable to help anymore.   Plan  ## Safety and Observation Level:  - Based on my clinical evaluation, I estimate the patient to be at low risk of self harm in the current setting   # Major depressive disorder-moderate, recurrent episode # Substance-induced mood disorder # Stimulant use disorder-cocaine # Prolonged QTc -Stop Zoloft and Seroquel -Start mirtazapine 7.5 mg nightly for depression and insomnia -Recommend inpatient psychiatry and psychotherapy -Recommend referral to [facility] for [facility] use -Advised cessation of all substance use  ## Disposition:  -- per primary team   Thank you for this consult request. Recommendations have been communicated to the primary team.  We will continue follow at this time.   France Ravens, MD   Relevant History  Relevant Aspects of Hospital Course:  Admitted on 11/01/2022 for acute metabolic  encephalopathy secondary to hypoglycemia and recent cocaine use.  Patient Report:  On assessment, patient reports having significant depression and anxiety related to her crack cocaine use.  She acknowledges that she had relapsed on crack cocaine approximately 3 months ago but states she is working with her sponsor in order for her to stop using crack cocaine.  She states she had 20 years prior to recent relapse of sobriety.  While she denies present SI/HI/AVH, she endorses MDD symptoms including depressed mood, anhedonia, low energy, poor sleep, increased guilt.  She states that she eats well and is able to concentrate throughout the day.  She states her poor sleep is related to rumination regarding her crack cocaine use.  She is very nervous to talk to family about her medical problems as well as her crack cocaine use as she feels that family is not very supportive and will treat her poorly.  She states that family has been somewhat more supportive during her 67 years of sobriety but she fears that her family will treat her poorly again as she has relapsed.  She endorses some manic symptoms including spending 3 to 4 days without sleep but appears to not have any pressured speech during these times.  She also states that she feels fatigued and is not extraordinarily impulsive or has racing thoughts during this period of time.  He does endorse some auditory/visual hallucinations but states she has not had any recently.  When discussing her psychotropic medications, patient states she uses Seroquel for sleep but only uses it approximately once a week.  She does state that she has taken zoloft but uncertain of compliance due to illiteracy.  ROS:  Denies SI/HI/AVH  Psychiatric History:  Previous Psych Diagnoses: Anxiety, depression Prior inpatient treatment: denies Current/prior outpatient treatment:[facility] [id number] History of suicide:denies History of homicide: denies Current  Psychiatrist: none Current therapist: none   Social History:  Tobacco use: denies Alcohol use: 1 beer socially (every few weeks) Drug use: smoked crack cocaine for past 3 months. 20 years sober prior to recent relapse.   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   Chronic back pain    Depression    Diabetes mellitus    GERD (gastroesophageal reflux disease)    Heart murmur    Hypertension    NSTEMI (non-ST elevated myocardial infarction) ([place]) 12/20/2021   Palpitations    Parkinson's disease     Surgical History: Past Surgical History:  Procedure Laterality Date   CARPAL TUNNEL RELEASE     IR FLUORO GUIDE CV LINE RIGHT  01/26/2022   IR US GUIDE VASC ACCESS RIGHT  01/26/2022   LEFT HEART CATH AND CORONARY ANGIOGRAPHY N/A 12/20/2021   Procedure: LEFT HEART CATH AND CORONARY ANGIOGRAPHY;  Surgeon: Burnell Blanks, MD;  Location: [facility];  Service: Cardiovascular;  Laterality: N/A;   OPEN REDUCTION INTERNAL FIXATION (ORIF) FINGER WITH RADIAL BONE GRAFT Right 09/12/2015   Procedure: RIGHT MIDDLE FINGER CLOSED REDUCTION AND PINNING;  Surgeon: Iran Planas, MD;  Location: [facility];  Service: Orthopedics;  Laterality: Right;   TONSILLECTOMY     TUBAL LIGATION      Medications:   Current Facility-Administered Medications:    0.9 %  sodium chloride infusion, 250 mL, Intravenous, PRN, Cardama, Grayce Sessions, MD   acetaminophen (TYLENOL) tablet 650 mg, 650 mg, Oral, Q6H PRN, 650 mg at 11/02/22 1216 **OR** acetaminophen (TYLENOL) suppository 650 mg, 650 mg, Rectal, Q6H PRN, Orma Flaming,  MD   albuterol (PROVENTIL) (2.5 MG/3ML) 0.083% nebulizer solution 2.5 mg, 2.5 mg, Inhalation, Q4H PRN, Orma Flaming, MD   amLODipine (NORVASC) tablet 10 mg, 10 mg, Oral, Daily, Orma Flaming, MD, 10 mg at 11/02/22 1212   aspirin EC tablet 81 mg, 81 mg, Oral, Daily, Orma Flaming, MD, 81 mg at 11/02/22 1215   atorvastatin (LIPITOR) tablet 80 mg, 80 mg, Oral, Daily, Orma Flaming, MD, 80 mg at 11/02/22 1216   enoxaparin (LOVENOX) injection 30 mg, 30 mg, Subcutaneous, Q24H, Orma Flaming, MD, 30 mg at 11/02/22 1216   fluticasone furoate-vilanterol (BREO ELLIPTA) 200-25 MCG/ACT 1 puff, 1 puff, Inhalation, Daily, Orma Flaming, MD   gabapentin (NEURONTIN) capsule 100 mg, 100 mg, Oral, QHS, Orma Flaming, MD, 100 mg at 11/01/22 2239   hydrALAZINE (APRESOLINE) injection 10 mg, 10 mg, Intravenous, Q8H PRN, Orma Flaming, MD   hydrALAZINE (APRESOLINE) tablet 50 mg, 50 mg, Oral, Q8H, Orma Flaming, MD, 50 mg at 11/02/22 1501   hydrOXYzine (ATARAX) tablet 50 mg, 50 mg, Oral, QHS, Orma Flaming, MD, 50 mg at 11/01/22 2239   isosorbide mononitrate (IMDUR) 24 hr tablet 15 mg, 15 mg, Oral, Daily, Orma Flaming, MD, 15 mg at 11/02/22 1214   lidocaine (LIDODERM) 5 % 1 patch, 1  patch, Transdermal, Q24H, Ghimire, Henreitta Leber, MD   mirtazapine (REMERON) tablet 7.5 mg, 7.5 mg, Oral, QHS, France Ravens, MD   pantoprazole (PROTONIX) injection 40 mg, 40 mg, Intravenous, Daily, Orma Flaming, MD, 40 mg at 11/02/22 1211   potassium chloride (KLOR-CON M) CR tablet 10 mEq, 10 mEq, Oral, Daily, Orma Flaming, MD, 10 mEq at 11/02/22 1212   sodium chloride flush (NS) 0.9 % injection 3 mL, 3 mL, Intravenous, Q12H, Cardama, Grayce Sessions, MD, 3 mL at 11/02/22 1217   sodium chloride flush (NS) 0.9 % injection 3 mL, 3 mL, Intravenous, PRN, Cardama, Grayce Sessions, MD   torsemide [facility]) tablet 20 mg, 20 mg, Oral, Daily, Orma Flaming, MD, 20 mg at 11/02/22 1212  Allergies: Allergies  Allergen Reactions   Penicillins  Shortness Of Breath and Other (See Comments)    Caused yeast infection Has patient had a PCN reaction causing immediate rash, facial/tongue/throat swelling, SOB or lightheadedness with hypotension: Yes Has patient had a PCN reaction causing severe rash involving mucus membranes or skin necrosis: No Has patient had a PCN reaction that required hospitalization pt was in the hospital at the time of the reaction Has patient had a PCN reaction occurring within the last 10 years: No If all of the above answers are "NO", then may proceed with Cephalosp   Ultram [Tramadol] Other (See Comments)    Made her tongue raw       Objective  Vital signs:  Temp:  [97.6 F (36.4 C)-97.9 F (36.6 C)] 97.9 F (36.6 C) (03/15 0400) Pulse Rate:  [88-99] 88 (03/15 0400) Resp:  [11-22] 18 (03/15 0400) BP: (147-192)/(76-99) 147/76 (03/15 1212) SpO2:  [97 %-100 %] 98 % (03/15 0400) Weight:  [90.6 kg] 90.6 kg (03/14 1853)  Psychiatric Specialty Exam:  Presentation  General Appearance:  Appropriate for Environment; Casual  Eye Contact: Fair  Speech: Clear and Coherent; Normal Rate  Speech Volume: Normal   Mood and Affect  Mood: Anxious; Depressed  Affect: Appropriate; Congruent   Thought Process  Thought Processes: Coherent; Goal Directed; Linear  Descriptions of Associations:Intact  Orientation:Full (Time, Place and Person)  Thought Content:Logical  Hallucinations:Hallucinations: None  Ideas of Reference:None  Suicidal Thoughts:Suicidal Thoughts: No  Homicidal Thoughts:Homicidal Thoughts: No   Sensorium  Memory: Remote Good  Judgment: Fair  Insight: Fair   Community education officer  Concentration: Fair  Attention Span: Fair  Recall: [employer] of Knowledge: Fair  Language: Fair   Psychomotor Activity  Psychomotor Activity: Psychomotor Activity: Normal   Assets  Assets: Communication Skills; Financial Resources/Insurance; Desire for Improvement;  Housing; Leisure Time; Physical Health; Resilience; Social Support; Talents/Skills   Sleep  Sleep: Sleep: Fair    Physical Exam: Physical Exam Vitals and nursing note reviewed.  Constitutional:      Appearance: Normal appearance. She is normal weight.  HENT:     Head: Normocephalic and atraumatic.  Pulmonary:     Effort: Pulmonary effort is normal.  Neurological:     General: No focal deficit present.     Mental Status: She is oriented to person, place, and time.

## 2022-11-02 NOTE — Progress Notes (Signed)
Daily Progress Note  DOA: 11/01/2022 Hospital Day: 2 Chief Complaint: Dysphagia   ASSESSMENT   62 yo female with the following:    Diabetes type 2, admitted for hypoglycemia.    Chronic solid food dysphagia.  Without weight loss. Barium swallow shows esophageal dysmotility and possibly stricture at GE junction.    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  Possibly anemia of chronic disease. Iron studies not suggestive of iron deficiency   Borderline prolonged QT interval   CKD 4   Chronic diastolic heart failure.  BNP 1,401. Pitting edema of BLE on admission   CAD, on plavix + asa Last dose of plavix was yesterday.   PLAN   -Continue PPI for history of GERD -Offered an EGD to be done after plavix washout and once acute medical issues have stabilized .  Last dose of Plavix was 11/01/22. The risks and benefits of EGD with possible biopsies were discussed with the patient yesterday and she wants to proceed.  -At some point she should have a screening colonoscopy ( appears she hasn't had one)   Subjective / New events:   Imaging:  Barium Esophogram IMPRESSION: 1. Possible stricture at the gastroesophageal junction with delayed barium passage. 2. Esophageal dysmotility with prominent tertiary contractions.   Lab Results: Recent Labs    11/01/22 0444 11/02/22 0806  WBC 4.8 4.6  HGB 9.6* 9.9*  HCT 29.0* 30.9*  PLT 186 211   BMET Recent Labs    11/01/22 0444 11/02/22 0806  NA 136 132*  K 3.7 4.0  CL 110 106  CO2 17* 17*  GLUCOSE 24* 92  BUN 19 20  CREATININE 2.85* 2.76*  CALCIUM 8.1* 8.3*   LFT Recent Labs    11/01/22 1214  PROT 6.2*  ALBUMIN 2.0*  AST 22  ALT 10  ALKPHOS 124  BILITOT 0.8  BILIDIR 0.2  IBILI 0.6   PT/INR No results for input(s): "LABPROT", "INR" in the last 72 hours.   Scheduled inpatient medications:   amLODipine  10 mg Oral Daily   aspirin EC  81 mg Oral Daily   atorvastatin  80 mg Oral Daily   enoxaparin  (LOVENOX) injection  30 mg Subcutaneous Q24H   fluticasone furoate-vilanterol  1 puff Inhalation Daily   gabapentin  100 mg Oral QHS   hydrALAZINE  50 mg Oral Q8H   hydrOXYzine  50 mg Oral QHS   isosorbide mononitrate  15 mg Oral Daily   mirtazapine  7.5 mg Oral QHS   pantoprazole (PROTONIX) IV  40 mg Intravenous Daily   potassium chloride  10 mEq Oral Daily   sodium chloride flush  3 mL Intravenous Q12H   torsemide  20 mg Oral Daily   Continuous inpatient infusions:   sodium chloride     PRN inpatient medications: sodium chloride, acetaminophen **OR** acetaminophen, albuterol, hydrALAZINE, sodium chloride flush  Vital signs in last 24 hours: Temp:  [97.6 F (36.4 C)-97.9 F (36.6 C)] 97.9 F (36.6 C) (03/15 0400) Pulse Rate:  [88-99] 88 (03/15 0400) Resp:  [11-22] 18 (03/15 0400) BP: (130-192)/(72-99) 147/76 (03/15 1212) SpO2:  [97 %-100 %] 98 % (03/15 0400) Weight:  [89 kg-90.6 kg] 90.6 kg (03/14 1853) Last BM Date :  (pta)  Intake/Output Summary (Last 24 hours) at 11/02/2022 1506 Last data filed at 11/02/2022 1434 Gross per 24 hour  Intake 2636.23 ml  Output 1700 ml  Net 936.23 ml    Intake/Output from previous day: 03/14 0701 -  03/15 0700 In: 2078.5 [I.V.:2028.5; IV Piggyback:50] Out: 800 [Urine:800] Intake/Output this shift: Total I/O In: 557.8 [I.V.:507.8; IV Piggyback:50] Out: 900 [Urine:900]   Physical Exam:  General: Alert female in NAD Heart:  Regular rate and rhythm.  Pulmonary: Normal respiratory effort Abdomen: Soft, nondistended, nontender. Normal bowel sounds. Extremities: No lower extremity edema  Neurologic: Alert and oriented Psych: [place]. Cooperative. Insight appears normal.    Principal Problem:   Diabetic hypoglycemia ([place]) Active Problems:   Essential hypertension   CAD in native artery   Anxiety with depression   CKD (chronic kidney disease) stage 4, GFR 15-29 ml/min (HCC)   cocaine abuse   borderline Prolonged QT interval    Chronic diastolic CHF (congestive heart failure) ([place])   Dysphagia   Hypoglycemia     LOS: 0 days   Tye Savoy ,NP 11/02/2022, 3:06 PM

## 2022-11-02 NOTE — TOC Initial Note (Signed)
Transition of Care [facility]) - Initial/Assessment Note    Patient Details  Name: Carol [name] MRN: [id number] Date of Birth: 06/05/1961  Transition of Care [facility]) CM/SW Contact:    Levonne Lapping, RN Phone Number: 11/02/2022, 3:02 PM  Clinical Narrative:                  Transition of Care [facility]) Screening Note   Patient Details  Name: Carol [name] Date of Birth: 04-24-1961   Transition of Care [facility]) CM/SW Contact:    Levonne Lapping, RN Phone Number: 11/02/2022, 3:02 PM    Transition of Care Department [facility]) has reviewed patient  Diabetes is unmanaged and Patient has limited education.  She will need reinforcement.  Patient will need [place] for Medication Management and Diabetes Teaching at DC. Order will be placed- Provider in agreement.   TOC will need to follow up on accepting [place]   We will continue to monitor patient advancement through interdisciplinary progression rounds. If new patient transition needs arise, please place a TOC consult.           Patient Goals and CMS Choice            Expected Discharge Plan and Services                                              Prior Living Arrangements/Services                       Activities of Daily Living      Permission Sought/Granted                  Emotional Assessment              Admission diagnosis:  Diabetic hypoglycemia ([place]) [E11.649] Hypoglycemia [E16.2] Patient Active Problem List   Diagnosis Date Noted   Hypoglycemia 11/02/2022   Chronic diastolic CHF (congestive heart failure) ([place]) 11/01/2022   Dysphagia 11/01/2022   Acute exacerbation of CHF (congestive heart failure) ([place]) 05/09/2022   High anion gap metabolic acidosis [id number]   Acute on chronic diastolic (congestive) heart failure ([place]) 03/22/2022   COPD not affecting current episode of care 03/22/2022   Class 2 obesity due to excess calories with  body mass index (BMI) of 37.0 to 37.9 in adult 03/22/2022   CHF (congestive heart failure) ([place]) 03/14/2022   borderline Prolonged QT interval 01/22/2022   Thyroid nodule 01/22/2022   cocaine abuse 01/02/2022   Diabetic hypoglycemia ([place]) 12/31/2021   CKD (chronic kidney disease) stage 4, GFR 15-29 ml/min ([place]) 12/31/2021   CAD in native artery 12/24/2021   Anxiety with depression 12/24/2021   Pure hypercholesterolemia    Diabetes mellitus ([place]) 01/29/2014   Essential hypertension 01/29/2014   Closed jaw fracture ([place]) 01/29/2014   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 [place] [address] Phone: [phone] Fax: [phone]     Social Determinants of Health (SDOH) Social History: SDOH Screenings   Food Insecurity: No Food Insecurity (04/04/2022)  Housing: Low Risk  (04/04/2022)  Transportation Needs: No Transportation Needs (04/04/2022)  Financial Resource Strain: Low Risk  (04/04/2022)  Tobacco Use: High Risk (11/01/2022)   SDOH Interventions:     Readmission Risk Interventions    03/27/2022   10:36 AM  Readmission Risk Prevention Plan  Transportation Screening Complete  Medication Review ([place]) Complete  PCP or Specialist appointment within 3-5 days of discharge Complete  HRI or [place] Complete  SW Recovery Care/Counseling Consult Complete  [place] Not Applicable

## 2022-11-02 NOTE — Progress Notes (Signed)
Speech Language Pathology Treatment: Dysphagia  Patient Details Name: Carol [name] MRN: [id number] DOB: 1961/03/14 Today's Date: 11/02/2022 Time: [id number] SLP Time Calculation (min) (ACUTE ONLY): 16 min  Assessment / Plan / Recommendation Clinical Impression  Pt seen for ongoing dysphagia management.  Pt continues to exhibit significant discomfort with POs and gasping after but no coughing was observed today with thin liquid water, applesauce, or meds crushed in gelatin.  Pt does take a long time with POs.  Barium esophagram completed today with findings of "1. Possible stricture at the gastroesophageal junction with delayed barium passage. 2. Esophageal dysmotility with prominent tertiary contractions."  Contrast retention observed on esophagram imaging in PACS in addition to prominent CP bar noted on yesterday's MBS.  Pt has EGD scheduled for Sunday.  Discussed diet preference with pt and significant other.  He states she does well with soups like cream of broccoli.  Recommend advancing to full liquid diet pending GI intervention.  SLP to follow up next week post EGD.  Recommend full liquid diet.  Administer medications crushed in puree or liquid (e.g. jello, ice cream).     HPI HPI: Carol [name] is a 62 y.o. female who presented to the emergency department for episode of hypoglycemia with BGL 27 on arrival. 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  Recommendations may be updated based on patient status, additional functional  criteria and insurance authorization.    Recommendations  Diet recommendations: Thin liquid Medication Administration: Crushed with puree (or liquid (e.g. Jello)) Supervision: Staff to assist with self feeding Compensations: Slow rate;Small sips/bites;Follow solids with liquid Postural Changes and/or Swallow Maneuvers: Seated upright 90 degrees                Follow Up Recommendations:  (TBD) SLP Visit Diagnosis: Dysphagia, pharyngoesophageal phase (R13.14) Plan: Continue with current plan of care           Celedonio Savage, [place], [place] Office: [phone] 11/02/2022, 12:47 PM

## 2022-11-02 NOTE — Plan of Care (Signed)

## 2022-11-03 DIAGNOSIS — Z7902 Long term (current) use of antithrombotics/antiplatelets: Secondary | ICD-10-CM | POA: Diagnosis not present

## 2022-11-03 DIAGNOSIS — R933 Abnormal findings on diagnostic imaging of other parts of digestive tract: Secondary | ICD-10-CM | POA: Diagnosis not present

## 2022-11-03 DIAGNOSIS — Z1211 Encounter for screening for malignant neoplasm of colon: Secondary | ICD-10-CM

## 2022-11-03 DIAGNOSIS — F418 Other specified anxiety disorders: Secondary | ICD-10-CM | POA: Diagnosis not present

## 2022-11-03 DIAGNOSIS — R1310 Dysphagia, unspecified: Secondary | ICD-10-CM | POA: Diagnosis not present

## 2022-11-03 DIAGNOSIS — R9431 Abnormal electrocardiogram [ECG] [EKG]: Secondary | ICD-10-CM | POA: Diagnosis not present

## 2022-11-03 DIAGNOSIS — E11649 Type 2 diabetes mellitus with hypoglycemia without coma: Secondary | ICD-10-CM | POA: Diagnosis not present

## 2022-11-03 DIAGNOSIS — R1319 Other dysphagia: Secondary | ICD-10-CM | POA: Diagnosis not present

## 2022-11-03 LAB — GLUCOSE, CAPILLARY
Glucose-Capillary: 223 mg/dL — ABNORMAL HIGH (ref 70–99)
Glucose-Capillary: 181 mg/dL — ABNORMAL HIGH (ref 70–99)
Glucose-Capillary: 219 mg/dL — ABNORMAL HIGH (ref 70–99)
Glucose-Capillary: 235 mg/dL — ABNORMAL HIGH (ref 70–99)

## 2022-11-03 LAB — BASIC METABOLIC PANEL
Anion gap: 9 (ref 5–15)
BUN: 27 mg/dL — ABNORMAL HIGH (ref 6–20)
CO2: 18 mmol/L — ABNORMAL LOW (ref 22–32)
Calcium: 8.3 mg/dL — ABNORMAL LOW (ref 8.9–10.3)
Chloride: 105 mmol/L (ref 98–111)
Creatinine, Ser: 3.38 mg/dL — ABNORMAL HIGH (ref 0.44–1.00)
GFR, Estimated: 15 mL/min — ABNORMAL LOW (ref >60)
Glucose, Bld: 195 mg/dL — ABNORMAL HIGH (ref 70–99)
Potassium: 5.2 mmol/L — ABNORMAL HIGH (ref 3.5–5.1)
Sodium: 132 mmol/L — ABNORMAL LOW (ref 135–145)

## 2022-11-03 LAB — MAGNESIUM: Magnesium: 2.5 mg/dL — ABNORMAL HIGH (ref 1.7–2.4)

## 2022-11-03 MED ORDER — POLYETHYLENE GLYCOL 3350 17 G PO PACK
17.0000 g | PACK | Freq: Every day | ORAL | Status: DC
  Administered 2022-11-03 – 2022-11-05 (×3): 17 g via ORAL
  Filled 2022-11-03 (×3): qty 1

## 2022-11-03 MED ORDER — HYDRALAZINE HCL 25 MG PO TABS
25.0000 mg | ORAL_TABLET | Freq: Three times a day (TID) | ORAL | Status: DC
  Administered 2022-11-03 – 2022-11-07 (×12): 25 mg via ORAL
  Filled 2022-11-03 (×12): qty 1

## 2022-11-03 MED ORDER — SODIUM ZIRCONIUM CYCLOSILICATE 10 G PO PACK
10.0000 g | PACK | Freq: Every day | ORAL | Status: AC
  Administered 2022-11-03: 10 g via ORAL
  Filled 2022-11-03: qty 1

## 2022-11-03 MED ORDER — INSULIN ASPART 100 UNIT/ML IJ SOLN
0.0000 [IU] | Freq: Three times a day (TID) | INTRAMUSCULAR | Status: DC
  Administered 2022-11-03 (×2): 7 [IU] via SUBCUTANEOUS
  Administered 2022-11-03: 4 [IU] via SUBCUTANEOUS
  Administered 2022-11-04: 3 [IU] via SUBCUTANEOUS
  Administered 2022-11-04: 4 [IU] via SUBCUTANEOUS
  Administered 2022-11-04: 7 [IU] via SUBCUTANEOUS
  Administered 2022-11-05 (×2): 4 [IU] via SUBCUTANEOUS
  Administered 2022-11-06: 3 [IU] via SUBCUTANEOUS
  Administered 2022-11-06: 4 [IU] via SUBCUTANEOUS
  Administered 2022-11-07: 7 [IU] via SUBCUTANEOUS

## 2022-11-03 MED ORDER — SODIUM CHLORIDE 0.9 % IV SOLN: INTRAVENOUS | Status: AC

## 2022-11-03 MED ORDER — AMLODIPINE BESYLATE 5 MG PO TABS
5.0000 mg | ORAL_TABLET | Freq: Every day | ORAL | Status: DC
  Administered 2022-11-03 – 2022-11-07 (×5): 5 mg via ORAL
  Filled 2022-11-03 (×5): qty 1

## 2022-11-03 MED ORDER — MELATONIN 3 MG PO TABS
3.0000 mg | ORAL_TABLET | Freq: Every evening | ORAL | Status: DC | PRN
  Administered 2022-11-03 – 2022-11-06 (×4): 3 mg via ORAL
  Filled 2022-11-03 (×4): qty 1

## 2022-11-03 NOTE — Consult Note (Signed)
[facility] Consult   Reason for Consult:  Concern for mania Referring Physician:  Dr. Sloan Leiter Patient Identification: Carol [name] MRN:  [id number] Principal Diagnosis: Diabetic hypoglycemia [facility]) Diagnosis:  Principal Problem:   Diabetic hypoglycemia ([place]) Active Problems:   Essential hypertension   CAD in native artery   Anxiety with depression   CKD (chronic kidney disease) stage 4, GFR 15-29 ml/min (HCC)   Cocaine use disorder, mild, abuse ([place])   borderline Prolonged QT interval   Chronic diastolic CHF (congestive heart failure) ([place])   Dysphagia   Hypoglycemia   MDD (major depressive disorder), recurrent episode, moderate (HCC)   Abnormal barium swallow   Esophageal stricture   Esophageal dysmotility   Antiplatelet or antithrombotic long-term use   Total Time spent with patient: 30 minutes  Subjective:   Carol [name] is a 62 y.o. female patient admitted with Diabetic Hypoglycemia.  HPI:   Per Dr. Lurline Hare 11/02/2022: On assessment, patient reports having significant depression and anxiety related to her crack cocaine use.  She acknowledges that she had relapsed on crack cocaine approximately 3 months ago but states she is working with her sponsor in order for her to stop using crack cocaine.  She states she had 20 years prior to recent relapse of sobriety.  While she denies present SI/HI/AVH, she endorses MDD symptoms including depressed mood, anhedonia, low energy, poor sleep, increased guilt.  She states that she eats well and is able to concentrate throughout the day.  She states her poor sleep is related to rumination regarding her crack cocaine use.  She is very nervous to talk to family about her medical problems as well as her crack cocaine use as she feels that family is not very supportive and will treat her poorly.  She states that family has been somewhat more supportive during her 30 years of sobriety but she fears that her family  will treat her poorly again as she has relapsed.   She endorses some manic symptoms including spending 3 to 4 days without sleep but appears to not have any pressured speech during these times.  She also states that she feels fatigued and is not extraordinarily impulsive or has racing thoughts during this period of time.  He does endorse some auditory/visual hallucinations but states she has not had any recently.   When discussing her psychotropic medications, patient states she uses Seroquel for sleep but only uses it approximately once a week.  She does state that she has taken zoloft but uncertain of compliance due to illiteracy.  On Interview: Patient seen laying in bed on my approach this afternoon accompanied by her significant other. The patient is unaware of the changes to her medication regimen when asked if she started the Remeron/Mirtazepine last night. Per chart review she did receive the medication for the first time last night. She reports that she did not have any issues with sleeping last night.   We discussed the patient's relapse and the frustration she has had with herself. She endorses auditory hallucinations talking to her about her relapse but she does not appear to be responding to internal stimuli on exam. She is tearful when discussing her relapse but finds hope in her relationship with her sponsor and her previous 20 years of sobriety.   She is not interested in voluntary psychiatric admission or rehab at this time but she is amenable to following up with mental health outpatient.   She denies any SI/HI/VH. Her significant other at  bedside denies any concerns about the patient's safety at this time or concerns about her being discharged once medically stable.   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   Chronic back pain    Depression    Diabetes mellitus    GERD (gastroesophageal reflux disease)    Heart murmur    Hypertension    NSTEMI (non-ST elevated myocardial infarction) ([place]) 12/20/2021   Palpitations    Parkinson's disease     Past Surgical History:  Procedure Laterality Date   CARPAL TUNNEL RELEASE     IR FLUORO GUIDE CV LINE RIGHT  01/26/2022   IR US GUIDE VASC ACCESS RIGHT  01/26/2022   LEFT HEART CATH AND CORONARY ANGIOGRAPHY N/A 12/20/2021   Procedure: LEFT HEART CATH AND CORONARY ANGIOGRAPHY;  Surgeon: Burnell Blanks, MD;  Location: [facility];  Service: Cardiovascular;  Laterality: N/A;   OPEN REDUCTION INTERNAL FIXATION (ORIF) FINGER WITH RADIAL BONE GRAFT Right 09/12/2015   Procedure: RIGHT MIDDLE FINGER CLOSED REDUCTION AND PINNING;  Surgeon: Iran Planas, MD;  Location: [facility];  Service: Orthopedics;  Laterality: Right;   TONSILLECTOMY     TUBAL LIGATION     Social History:  Social History   Substance and Sexual Activity  Alcohol Use Yes   Alcohol/week: 0.0 standard drinks of alcohol   Comment: occasional     Social History   Substance and Sexual Activity  Drug Use No   Comment: none for 13 years    Social History   Socioeconomic History   Marital status: Single    Spouse name: Not on file   Number of children: 3   Years of education: Not on file   Highest education level: Not on file  Occupational History   Not on file  Tobacco Use   Smoking status: Some Days    Packs/day: 0.25    Years: 3.00    Additional pack years: 0.00    Total pack years: 0.75    Types: Cigarettes   Smokeless tobacco: Never  Vaping Use   Vaping Use: Never used  Substance and Sexual Activity   Alcohol use: Yes    Alcohol/week: 0.0  standard drinks of alcohol    Comment: occasional   Drug use: No    Comment: none for 13 years   Sexual activity: Not on file  Other Topics Concern   Not on file  Social History Narrative   Not on file   Social Determinants of Health   Financial Resource Strain: Low Risk  (04/04/2022)   Overall Financial Resource Strain (CARDIA)    Difficulty of Paying Living Expenses: Not very hard  Food Insecurity: No Food Insecurity (04/04/2022)   Hunger Vital Sign    Worried About Running Out of Food in the Last Year: Never true    Ran Out of Food in the Last Year: Never true  Transportation Needs: No Transportation Needs (04/04/2022)   PRAPARE - Hydrologist (Medical): No    Lack of Transportation (Non-Medical): No  Physical Activity: Not on file  Stress:  Not on file  Social Connections: Not on file   Additional Social History:    Allergies:   Allergies  Allergen Reactions   Penicillins Shortness Of Breath and Other (See Comments)    Caused yeast infection Has patient had a PCN reaction causing immediate rash, facial/tongue/throat swelling, SOB or lightheadedness with hypotension: Yes Has patient had a PCN reaction causing severe rash involving mucus membranes or skin necrosis: No Has patient had a PCN reaction that required hospitalization pt was in the hospital at the time of the reaction Has patient had a PCN reaction occurring within the last 10 years: No If all of the above answers are "NO", then may proceed with Cephalosp   Ultram [Tramadol] Other (See Comments)    Made her tongue raw    Labs:  Results for orders placed or performed during the hospital encounter of 11/01/22 (from the past 48 hour(s))  CBG monitoring, ED     Status: Abnormal   Collection Time: 11/01/22  2:17 PM  Result Value Ref Range   Glucose-Capillary 115 (H) 70 - 99 mg/dL    Comment: Glucose reference range applies only to samples taken after fasting for at least 8 hours.  CBG  monitoring, ED     Status: Abnormal   Collection Time: 11/01/22  4:40 PM  Result Value Ref Range   Glucose-Capillary 42 (LL) 70 - 99 mg/dL    Comment: Glucose reference range applies only to samples taken after fasting for at least 8 hours.   Comment 1 Document in Chart   CBG monitoring, ED     Status: Abnormal   Collection Time: 11/01/22  5:48 PM  Result Value Ref Range   Glucose-Capillary 59 (L) 70 - 99 mg/dL    Comment: Glucose reference range applies only to samples taken after fasting for at least 8 hours.  Glucose, capillary     Status: Abnormal   Collection Time: 11/01/22  6:53 PM  Result Value Ref Range   Glucose-Capillary 136 (H) 70 - 99 mg/dL    Comment: Glucose reference range applies only to samples taken after fasting for at least 8 hours.  Troponin I (High Sensitivity)     Status: None   Collection Time: 11/01/22  6:55 PM  Result Value Ref Range   Troponin I (High Sensitivity) 14 <18 ng/L    Comment: (NOTE) Elevated high sensitivity troponin I (hsTnI) values and significant  changes across serial measurements may suggest ACS but many other  chronic and acute conditions are known to elevate hsTnI results.  Refer to the "Links" section for chest pain algorithms and additional  guidance. Performed at [facility], [place] [address] [place], [place] [address]   Ferritin     Status: None   Collection Time: 11/01/22  6:55 PM  Result Value Ref Range   Ferritin 159 11 - 307 ng/mL    Comment: Performed at [facility], [place] [address], [place], [place] [address]  Iron and TIBC     Status: Abnormal   Collection Time: 11/01/22  6:55 PM  Result Value Ref Range   Iron 76 28 - 170 ug/dL    Comment: HEMOLYSIS AT THIS LEVEL MAY AFFECT RESULT   TIBC 203 (L) 250 - 450 ug/dL   Saturation Ratios 37 (H) 10.4 - 31.8 %   UIBC 127 ug/dL    Comment: Performed at [facility], [place] [address], [place], [place] [address]  Glucose, capillary  Status: Abnormal    Collection Time: 11/01/22  8:07 PM  Result Value Ref Range   Glucose-Capillary 134 (H) 70 - 99 mg/dL    Comment: Glucose reference range applies only to samples taken after fasting for at least 8 hours.  Glucose, capillary     Status: Abnormal   Collection Time: 11/01/22 10:27 PM  Result Value Ref Range   Glucose-Capillary 137 (H) 70 - 99 mg/dL    Comment: Glucose reference range applies only to samples taken after fasting for at least 8 hours.  Glucose, capillary     Status: Abnormal   Collection Time: 11/02/22 12:02 AM  Result Value Ref Range   Glucose-Capillary 118 (H) 70 - 99 mg/dL    Comment: Glucose reference range applies only to samples taken after fasting for at least 8 hours.  Glucose, capillary     Status: Abnormal   Collection Time: 11/02/22  2:13 AM  Result Value Ref Range   Glucose-Capillary 127 (H) 70 - 99 mg/dL    Comment: Glucose reference range applies only to samples taken after fasting for at least 8 hours.  Glucose, capillary     Status: Abnormal   Collection Time: 11/02/22  4:08 AM  Result Value Ref Range   Glucose-Capillary 114 (H) 70 - 99 mg/dL    Comment: Glucose reference range applies only to samples taken after fasting for at least 8 hours.  Glucose, capillary     Status: Abnormal   Collection Time: 11/02/22  5:52 AM  Result Value Ref Range   Glucose-Capillary 105 (H) 70 - 99 mg/dL    Comment: Glucose reference range applies only to samples taken after fasting for at least 8 hours.  Glucose, capillary     Status: Abnormal   Collection Time: 11/02/22  7:52 AM  Result Value Ref Range   Glucose-Capillary 100 (H) 70 - 99 mg/dL    Comment: Glucose reference range applies only to samples taken after fasting for at least 8 hours.  Basic metabolic panel     Status: Abnormal   Collection Time: 11/02/22  8:06 AM  Result Value Ref Range   Sodium 132 (L) 135 - 145 mmol/L   Potassium 4.0 3.5 - 5.1 mmol/L   Chloride 106 98 - 111 mmol/L   CO2 17 (L) 22 - 32 mmol/L    Glucose, Bld 92 70 - 99 mg/dL    Comment: Glucose reference range applies only to samples taken after fasting for at least 8 hours.   BUN 20 6 - 20 mg/dL   Creatinine, Ser 2.76 (H) 0.44 - 1.00 mg/dL   Calcium 8.3 (L) 8.9 - 10.3 mg/dL   GFR, Estimated 19 (L) >60 mL/min    Comment: (NOTE) Calculated using the CKD-EPI Creatinine Equation (2021)    Anion gap 9 5 - 15    Comment: Performed at [facility] [address], [place], [place] [address]  CBC     Status: Abnormal   Collection Time: 11/02/22  8:06 AM  Result Value Ref Range   WBC 4.6 4.0 - 10.5 K/uL   RBC 3.43 (L) 3.87 - 5.11 MIL/uL   Hemoglobin 9.9 (L) 12.0 - 15.0 g/dL   HCT 30.9 (L) 36.0 - 46.0 %   MCV 90.1 80.0 - 100.0 fL   MCH 28.9 26.0 - 34.0 pg   MCHC 32.0 30.0 - 36.0 g/dL   RDW 13.7 11.5 - 15.5 %   Platelets 211 150 - 400 K/uL   nRBC  0.0 0.0 - 0.2 %    Comment: Performed at [facility], [place] [address] [place], [place] [address]  Magnesium     Status: None   Collection Time: 11/02/22  8:06 AM  Result Value Ref Range   Magnesium 1.8 1.7 - 2.4 mg/dL    Comment: Performed at [facility] [address] [place], [place] [address]  Glucose, capillary     Status: None   Collection Time: 11/02/22  9:42 AM  Result Value Ref Range   Glucose-Capillary 97 70 - 99 mg/dL    Comment: Glucose reference range applies only to samples taken after fasting for at least 8 hours.  Glucose, capillary     Status: Abnormal   Collection Time: 11/02/22 11:55 AM  Result Value Ref Range   Glucose-Capillary 141 (H) 70 - 99 mg/dL    Comment: Glucose reference range applies only to samples taken after fasting for at least 8 hours.  Glucose, capillary     Status: Abnormal   Collection Time: 11/02/22  2:24 PM  Result Value Ref Range   Glucose-Capillary 302 (H) 70 - 99 mg/dL    Comment: Glucose reference range applies only to samples taken after fasting for at least 8 hours.  Glucose, capillary     Status: Abnormal    Collection Time: 11/02/22  3:47 PM  Result Value Ref Range   Glucose-Capillary 337 (H) 70 - 99 mg/dL    Comment: Glucose reference range applies only to samples taken after fasting for at least 8 hours.  Glucose, capillary     Status: Abnormal   Collection Time: 11/02/22  5:01 PM  Result Value Ref Range   Glucose-Capillary 322 (H) 70 - 99 mg/dL    Comment: Glucose reference range applies only to samples taken after fasting for at least 8 hours.  Glucose, capillary     Status: Abnormal   Collection Time: 11/02/22  9:17 PM  Result Value Ref Range   Glucose-Capillary 258 (H) 70 - 99 mg/dL    Comment: Glucose reference range applies only to samples taken after fasting for at least 8 hours.  Basic metabolic panel     Status: Abnormal   Collection Time: 11/03/22  3:48 AM  Result Value Ref Range   Sodium 132 (L) 135 - 145 mmol/L   Potassium 5.2 (H) 3.5 - 5.1 mmol/L   Chloride 105 98 - 111 mmol/L   CO2 18 (L) 22 - 32 mmol/L   Glucose, Bld 195 (H) 70 - 99 mg/dL    Comment: Glucose reference range applies only to samples taken after fasting for at least 8 hours.   BUN 27 (H) 6 - 20 mg/dL   Creatinine, Ser 3.38 (H) 0.44 - 1.00 mg/dL   Calcium 8.3 (L) 8.9 - 10.3 mg/dL   GFR, Estimated 15 (L) >60 mL/min    Comment: (NOTE) Calculated using the CKD-EPI Creatinine Equation (2021)    Anion gap 9 5 - 15    Comment: Performed at [facility] [address] [place], [place] [address]  Magnesium     Status: Abnormal   Collection Time: 11/03/22  3:48 AM  Result Value Ref Range   Magnesium 2.5 (H) 1.7 - 2.4 mg/dL    Comment: Performed at [facility] [address], [place], [place] [address]  Glucose, capillary     Status: Abnormal   Collection Time: 11/03/22  7:28 AM  Result Value Ref Range   Glucose-Capillary  223 (H) 70 - 99 mg/dL    Comment: Glucose reference range applies only to samples taken after fasting for at least 8 hours.  Glucose, capillary     Status: Abnormal    Collection Time: 11/03/22 11:50 AM  Result Value Ref Range   Glucose-Capillary 181 (H) 70 - 99 mg/dL    Comment: Glucose reference range applies only to samples taken after fasting for at least 8 hours.    Current Facility-Administered Medications  Medication Dose Route Frequency Provider Last Rate Last Admin   0.9 %  sodium chloride infusion  250 mL Intravenous PRN Cardama, Grayce Sessions, MD       0.9 %  sodium chloride infusion   Intravenous Continuous Jonetta Osgood, MD 50 mL/hr at 11/03/22 0838 New Bag at 11/03/22 [id number]   acetaminophen (TYLENOL) tablet 650 mg  650 mg Oral Q6H PRN Orma Flaming, MD   650 mg at 11/02/22 1216   Or   acetaminophen (TYLENOL) suppository 650 mg  650 mg Rectal Q6H PRN Orma Flaming, MD       albuterol (PROVENTIL) (2.5 MG/3ML) 0.083% nebulizer solution 2.5 mg  2.5 mg Inhalation Q4H PRN Orma Flaming, MD       amLODipine (NORVASC) tablet 5 mg  5 mg Oral Daily Jonetta Osgood, MD   5 mg at 11/03/22 0825   aspirin EC tablet 81 mg  81 mg Oral Daily Orma Flaming, MD   81 mg at 11/03/22 0827   atorvastatin (LIPITOR) tablet 80 mg  80 mg Oral Daily Orma Flaming, MD   80 mg at 11/03/22 0826   docusate sodium (COLACE) capsule 100 mg  100 mg Oral BID PRN Mansy, Jan A, MD   100 mg at 11/03/22 0203   enoxaparin (LOVENOX) injection 30 mg  30 mg Subcutaneous Q24H Orma Flaming, MD   30 mg at 11/03/22 1041   fluticasone furoate-vilanterol (BREO ELLIPTA) 200-25 MCG/ACT 1 puff  1 puff Inhalation Daily Orma Flaming, MD   1 puff at 11/03/22 0757   gabapentin (NEURONTIN) capsule 100 mg  100 mg Oral QHS Orma Flaming, MD   100 mg at 11/02/22 2123   hydrALAZINE (APRESOLINE) injection 10 mg  10 mg Intravenous Q8H PRN Orma Flaming, MD       hydrALAZINE (APRESOLINE) tablet 25 mg  25 mg Oral Q8H Ghimire, Shanker M, MD       hydrOXYzine (ATARAX) tablet 50 mg  50 mg Oral QHS Orma Flaming, MD   50 mg at 11/02/22 2123   insulin aspart (novoLOG) injection 0-20 Units  0-20  Units Subcutaneous TID WC Jonetta Osgood, MD   4 Units at 11/03/22 1202   isosorbide mononitrate (IMDUR) 24 hr tablet 15 mg  15 mg Oral Daily Orma Flaming, MD   15 mg at 11/03/22 0824   lidocaine (LIDODERM) 5 % 1 patch  1 patch Transdermal Q24H Jonetta Osgood, MD   1 patch at 11/02/22 1826   mirtazapine (REMERON) tablet 7.5 mg  7.5 mg Oral QHS France Ravens, MD   7.5 mg at 11/02/22 2123   pantoprazole (PROTONIX) injection 40 mg  40 mg Intravenous Daily Orma Flaming, MD   40 mg at 11/03/22 0831   sodium chloride flush (NS) 0.9 % injection 3 mL  3 mL Intravenous Q12H Fatima Blank, MD   3 mL at 11/03/22 0827   sodium chloride flush (NS) 0.9 % injection 3 mL  3 mL Intravenous PRN Cardama, Grayce Sessions, MD  traZODone (DESYREL) tablet 25 mg  25 mg Oral QHS PRN Mansy, Jan A, MD   25 mg at 11/03/22 0203    Psychiatric Specialty Exam:  Presentation  General Appearance:  Appropriate for Environment  Eye Contact: Fair  Speech: Normal Rate  Speech Volume: Normal  Handedness:No data recorded  Mood and Affect  Mood: Depressed  Affect: Depressed   Thought Process  Thought Processes: Linear; Coherent  Descriptions of Associations:Intact  Orientation:Full (Time, Place and Person)  Thought Content:Logical  History of Schizophrenia/Schizoaffective disorder:No data recorded Duration of Psychotic Symptoms:No data recorded Hallucinations:Hallucinations: Auditory Description of Auditory Hallucinations: voices talking to her about her relapse  Ideas of Reference:None  Suicidal Thoughts:Suicidal Thoughts: No  Homicidal Thoughts:Homicidal Thoughts: No   Sensorium  Memory: Immediate Good; Remote Good  Judgment: Fair  Insight: Fair   Community education officer  Concentration: Good  Attention Span: Good  Recall: Good  Fund of Knowledge: Fair  Language: Good   Psychomotor Activity  Psychomotor Activity: Psychomotor Activity:  Normal   Assets  Assets: Communication Skills; Financial Resources/Insurance; Desire for Improvement; Housing; Leisure Time; Physical Health; Resilience; Social Support; Talents/Skills   Sleep  Sleep: Sleep: Fair   Physical Exam: Physical Exam ROS Blood pressure (!) 120/53, pulse 97, temperature 97.7 F (36.5 C), temperature source Oral, resp. rate (!) 22, height 5\' 4"  (1.626 m), weight 90.6 kg, SpO2 98 %. Body mass index is 34.28 kg/m.  Treatment Plan Summary: -Safety and Observation Level:  - Based on my clinical evaluation, I estimate the patient to be at low risk of self harm in the current setting     -Major depressive disorder-moderate, recurrent episode -Substance-induced Depressive Disorder -Stimulant use disorder-cocaine -Prolonged QTc -Stopped Zoloft and Seroquel 11/02/2022 -Continue mirtazapine 7.5 mg nightly for depression and insomnia -Recommend voluntary inpatient psychiatry and psychotherapy however the patient is not amenable  -Recommend referral to [facility] for cocaine use -Advised cessation of all substance use  -Disposition:  -- per primary team  Patient clear from a psychiatric standpoint please reconsult if necessary.  Pecolia Ades, DO 11/03/2022 1:30 PM

## 2022-11-03 NOTE — Progress Notes (Signed)
[facility] Progress Note  CC:  Dysphagia  Subjective:  Feels fine.  No new complaints.  No BM in a few days.  Objective:  Vital signs in last 24 hours: Temp:  [97.5 F (36.4 C)-98.4 F (36.9 C)] 97.7 F (36.5 C) (03/16 1200) Pulse Rate:  [87-97] 97 (03/16 1200) Resp:  [18-22] 22 (03/16 1200) BP: (103-127)/(53-93) 120/53 (03/16 1200) SpO2:  [95 %-100 %] 98 % (03/16 1200) Last BM Date : 10/31/22 General:  Alert, Well-developed, in NAD Heart:  Regular rate and rhythm; no murmurs Pulm:  CTAB.  No W/R/R. Abdomen:  Soft, non-distended.  BS present.  Non-tender. Extremities:  Without edema. Neurologic:  Alert and oriented x 4;  grossly normal neurologically. Psych:  Alert and cooperative. Normal mood and affect.  Intake/Output from previous day: 03/15 0701 - 03/16 0700 In: 557.8 [I.V.:507.8; IV Piggyback:50] Out: 900 [Urine:900] Intake/Output this shift: Total I/O In: 960 [P.O.:960] Out: -   Lab Results: Recent Labs    11/01/22 0444 11/02/22 0806  WBC 4.8 4.6  HGB 9.6* 9.9*  HCT 29.0* 30.9*  PLT 186 211   BMET Recent Labs    11/01/22 0444 11/02/22 0806 11/03/22 0348  NA 136 132* 132*  K 3.7 4.0 5.2*  CL 110 106 105  CO2 17* 17* 18*  GLUCOSE 24* 92 195*  BUN 19 20 27*  CREATININE 2.85* 2.76* 3.38*  CALCIUM 8.1* 8.3* 8.3*   LFT Recent Labs    11/01/22 1214  PROT 6.2*  ALBUMIN 2.0*  AST 22  ALT 10  ALKPHOS 124  BILITOT 0.8  BILIDIR 0.2  IBILI 0.6   DG ESOPHAGUS W SINGLE CM (SOL OR THIN BA)  Result Date: 11/02/2022 CLINICAL DATA:  62 year old with history of chronic solid food dysphagia. EXAM: ESOPHAGUS/BARIUM SWALLOW/TABLET STUDY TECHNIQUE: Single contrast examination was performed using thin liquid barium. This exam was performed by Brynda Greathouse PA-C, and was supervised and interpreted by Logan Bores, MD. FLUOROSCOPY: Radiation Exposure Index (as provided by the fluoroscopic device): 16.5 mGy Kerma COMPARISON:  Modified barium  swallow 11/01/2022. FINDINGS: The examination was limited due to the patient's clinical status, decreased mobility, and difficulty tolerating the examination. Swallowing: More fully evaluated on yesterday's modified barium swallow. No aspiration was observed today. The patient reported discomfort with swallowing and required multiple swallows per bolus. Pharynx: Prominent cricopharyngeus. Esophagus: Apparent moderate, smooth narrowing at the gastroesophageal junction. Mildly patulous appearance of the esophagus more proximally. Esophageal motility: Stasis of contrast in the lower esophagus with some to and fro motion and frequent tertiary contractions. Delayed passage of barium through the gastroesophageal junction. Hiatal Hernia: None. Gastroesophageal reflux: None visualized. Ingested 13 mm barium tablet: Not given IMPRESSION: 1. Possible stricture at the gastroesophageal junction with delayed barium passage. 2. Esophageal dysmotility with prominent tertiary contractions. Electronically Signed   By: Logan Bores M.D.   On: 11/02/2022 10:31    Assessment / Plan: 62 yo female with the following:    Diabetes type 2, admitted for hypoglycemia   Chronic solid food dysphagia.  Without weight loss. Barium swallow shows esophageal dysmotility and possibly stricture at GE junction.    Chronic [place] anemia (since May 2023).  Possibly anemia of chronic disease. Iron studies not suggestive of iron deficiency.  Hgb stable.   Borderline prolonged QT interval   CKD 4   Chronic diastolic heart failure.  BNP 1,401   CAD, on plavix + asa Last dose of plavix was 3/14.   -Continue  PPI for history of GERD. -She is agreeable to both EGD and colonoscopy on 3/19 after Plavix wash-out. -Added Miralax daily for constipation.    LOS: 1 day   Laban Emperor. Annali Lybrand  11/03/2022, 4:00 PM

## 2022-11-03 NOTE — H&P (View-Only) (Signed)
[facility] Progress Note  CC:  Dysphagia  Subjective:  Feels fine.  No new complaints.  No BM in a few days.  Objective:  Vital signs in last 24 hours: Temp:  [97.5 F (36.4 C)-98.4 F (36.9 C)] 97.7 F (36.5 C) (03/16 1200) Pulse Rate:  [87-97] 97 (03/16 1200) Resp:  [18-22] 22 (03/16 1200) BP: (103-127)/(53-93) 120/53 (03/16 1200) SpO2:  [95 %-100 %] 98 % (03/16 1200) Last BM Date : 10/31/22 General:  Alert, Well-developed, in NAD Heart:  Regular rate and rhythm; no murmurs Pulm:  CTAB.  No W/R/R. Abdomen:  Soft, non-distended.  BS present.  Non-tender. Extremities:  Without edema. Neurologic:  Alert and oriented x 4;  grossly normal neurologically. Psych:  Alert and cooperative. Normal mood and affect.  Intake/Output from previous day: 03/15 0701 - 03/16 0700 In: 557.8 [I.V.:507.8; IV Piggyback:50] Out: 900 [Urine:900] Intake/Output this shift: Total I/O In: 960 [P.O.:960] Out: -   Lab Results: Recent Labs    11/01/22 0444 11/02/22 0806  WBC 4.8 4.6  HGB 9.6* 9.9*  HCT 29.0* 30.9*  PLT 186 211   BMET Recent Labs    11/01/22 0444 11/02/22 0806 11/03/22 0348  NA 136 132* 132*  K 3.7 4.0 5.2*  CL 110 106 105  CO2 17* 17* 18*  GLUCOSE 24* 92 195*  BUN 19 20 27*  CREATININE 2.85* 2.76* 3.38*  CALCIUM 8.1* 8.3* 8.3*   LFT Recent Labs    11/01/22 1214  PROT 6.2*  ALBUMIN 2.0*  AST 22  ALT 10  ALKPHOS 124  BILITOT 0.8  BILIDIR 0.2  IBILI 0.6   DG ESOPHAGUS W SINGLE CM (SOL OR THIN BA)  Result Date: 11/02/2022 CLINICAL DATA:  62 year old with history of chronic solid food dysphagia. EXAM: ESOPHAGUS/BARIUM SWALLOW/TABLET STUDY TECHNIQUE: Single contrast examination was performed using thin liquid barium. This exam was performed by Brynda Greathouse PA-C, and was supervised and interpreted by Logan Bores, MD. FLUOROSCOPY: Radiation Exposure Index (as provided by the fluoroscopic device): 16.5 mGy Kerma COMPARISON:  Modified barium  swallow 11/01/2022. FINDINGS: The examination was limited due to the patient's clinical status, decreased mobility, and difficulty tolerating the examination. Swallowing: More fully evaluated on yesterday's modified barium swallow. No aspiration was observed today. The patient reported discomfort with swallowing and required multiple swallows per bolus. Pharynx: Prominent cricopharyngeus. Esophagus: Apparent moderate, smooth narrowing at the gastroesophageal junction. Mildly patulous appearance of the esophagus more proximally. Esophageal motility: Stasis of contrast in the lower esophagus with some to and fro motion and frequent tertiary contractions. Delayed passage of barium through the gastroesophageal junction. Hiatal Hernia: None. Gastroesophageal reflux: None visualized. Ingested 13 mm barium tablet: Not given IMPRESSION: 1. Possible stricture at the gastroesophageal junction with delayed barium passage. 2. Esophageal dysmotility with prominent tertiary contractions. Electronically Signed   By: Logan Bores M.D.   On: 11/02/2022 10:31    Assessment / Plan: 62 yo female with the following:    Diabetes type 2, admitted for hypoglycemia   Chronic solid food dysphagia.  Without weight loss. Barium swallow shows esophageal dysmotility and possibly stricture at GE junction.    Chronic [place] anemia (since May 2023).  Possibly anemia of chronic disease. Iron studies not suggestive of iron deficiency.  Hgb stable.   Borderline prolonged QT interval   CKD 4   Chronic diastolic heart failure.  BNP 1,401   CAD, on plavix + asa Last dose of plavix was 3/14.   -Continue  PPI for history of GERD. -She is agreeable to both EGD and colonoscopy on 3/19 after Plavix wash-out. -Added Miralax daily for constipation.    LOS: 1 day   Laban Emperor. Hena Ewalt  11/03/2022, 4:00 PM

## 2022-11-03 NOTE — Progress Notes (Signed)
PROGRESS NOTE        PATIENT DETAILS Name: Carol [name] Age: 62 y.o. Sex: female Date of Birth: 1960/12/20 Admit Date: 11/01/2022 Admitting Physician Evalee Mutton Kristeen Mans, MD [id number], [name], NP  Brief Summary: Patient is a 62 y.o.  female chronic HFpEF, CAD-being medically managed, DM-2, HTN, stage IV CKD, crack cocaine use-presented with acute metabolic encephalopathy in the setting of hypoglycemia.  Patient also acknowledged solid food dysphagia for close to a year.  She was subsequently admitted to the hospitalist service.  Significant events: 3/14>> admit to [facility]  Significant studies: None  Significant microbiology data: None  Procedures: None  Consults: GI Psych  Subjective: No major issues-no complaints.  Significant other at bedside.  No chest pain/shortness of breath.  Objective: Vitals: Blood pressure 127/71, pulse 90, temperature 98.4 F (36.9 C), temperature source Oral, resp. rate 19, height 5\' 4"  (1.626 m), weight 90.6 kg, SpO2 98 %.   Exam: Gen Exam:Alert awake-not in any distress HEENT:atraumatic, normocephalic Chest: B/L clear to auscultation anteriorly CVS:S1S2 regular Abdomen:soft non tender, non distended Extremities:no edema Neurology: Non focal Skin: no rash  Pertinent Labs/Radiology:    Latest Ref Rng & Units 11/02/2022    8:06 AM 11/01/2022    4:44 AM 07/22/2022    5:04 AM  CBC  WBC 4.0 - 10.5 K/uL 4.6  4.8  4.7   Hemoglobin 12.0 - 15.0 g/dL 9.9  9.6  10.8   Hematocrit 36.0 - 46.0 % 30.9  29.0  31.4   Platelets 150 - 400 K/uL 211  186  222     Lab Results  Component Value Date   NA 132 (L) 11/03/2022   K 5.2 (H) 11/03/2022   CL 105 11/03/2022   CO2 18 (L) 11/03/2022      Assessment/Plan: DM-2 with hypoglycemia (A1C6.2 on 3/14) Recurrent issue-similar hospitalization December 2023 Home regimen includes Lantus 5 units daily (but she is taking 12 units daily), metformin and glipizide.  Given  severity of hypoglycemia-suspect can stop glipizide and Lantus and just maintain on metformin at the time of discharge. CBGs now on the higher side-no longer on D10 infusion Change SSI to resistant scale.  Needs extensive diabetic education-Per outpatient cardiology note-patient has limited reading ability-finish 12 grade but did not pass-she apparently was in special needs classes.  This may be the reason why she has recurrent hypoglycemia.   Recent Labs    11/02/22 1701 11/02/22 2117 11/03/22 0728  GLUCAP 322* 258* 223*      Dysphagia Mostly to solids-ongoing x 1 year Bili esophagogram confirms liver esophageal sphincter GI following for potential EGD/dilatation once Plavix washout is complete.  CAD with history of non-STEMI 4/23 (LHC with occluded small sub-branch of PDA-treated medically) No anginal symptoms currently Hold Plavix-as patient may need EGD/dilatation. Continue aspirin/statin Suspect not on beta-blocker given history of active cocaine use  Chronic HFpEF Euvolemic Hold Demadex due to mild AKI  Prolonged QTc interval Telemetry monitoring Keep K> 4, Mg> 2 QTc improved on 3/16-with borderline QTc prolongation Avoid QTc prolonging agents  Hypomagnesemia Repleted.  HTN BP stable but soft Decreased the dosage of amlodipine/hydralazine Continue current dosing of Imdur Reassess 3/17  AKI on CKD 4 Mildly elevated creatinine today Gently hydrate with IVF for a few hours Hold Demadex Follow electrolytes  History of liver sarcoidosis Stable LFTs Resume outpatient follow-up with GI  Anxiety/depression Stable Given prolonged QTc Psych consulted-Zoloft/Seroquel discontinued-now on Remeron  Obesity: Estimated body mass index is 34.28 kg/m as calculated from the following:   Height as of this encounter: 5\' 4"  (1.626 m).   Weight as of this encounter: 90.6 kg.   Code status:   Code Status: Full Code   DVT Prophylaxis: enoxaparin (LOVENOX) injection  30 mg Start: 11/01/22 1045 Place TED hose Start: 11/01/22 1007    Family Communication: Significant other at bedside   Disposition Plan: Status is: Observation The patient will require care spanning > 2 midnights and should be moved to inpatient because: Severity of illness   Planned Discharge Destination:Home   Diet: Diet Order             Diet full liquid Room service appropriate? Yes; Fluid consistency: Thin  Diet effective now                     Antimicrobial agents: Anti-infectives (From admission, onward)    None        MEDICATIONS: Scheduled Meds:  amLODipine  5 mg Oral Daily   aspirin EC  81 mg Oral Daily   atorvastatin  80 mg Oral Daily   enoxaparin (LOVENOX) injection  30 mg Subcutaneous Q24H   fluticasone furoate-vilanterol  1 puff Inhalation Daily   gabapentin  100 mg Oral QHS   hydrALAZINE  25 mg Oral Q8H   hydrOXYzine  50 mg Oral QHS   insulin aspart  0-20 Units Subcutaneous TID WC   isosorbide mononitrate  15 mg Oral Daily   lidocaine  1 patch Transdermal Q24H   mirtazapine  7.5 mg Oral QHS   pantoprazole (PROTONIX) IV  40 mg Intravenous Daily   sodium chloride flush  3 mL Intravenous Q12H   Continuous Infusions:  sodium chloride     sodium chloride 50 mL/hr at 11/03/22 0838   PRN Meds:.sodium chloride, acetaminophen **OR** acetaminophen, albuterol, docusate sodium, hydrALAZINE, sodium chloride flush, traZODone   I have personally reviewed following labs and imaging studies  LABORATORY DATA: CBC: Recent Labs  Lab 11/01/22 0444 11/02/22 0806  WBC 4.8 4.6  HGB 9.6* 9.9*  HCT 29.0* 30.9*  MCV 90.3 90.1  PLT 186 211     Basic Metabolic Panel: Recent Labs  Lab 11/01/22 0444 11/01/22 1214 11/02/22 0806 11/03/22 0348  NA 136  --  132* 132*  K 3.7  --  4.0 5.2*  CL 110  --  106 105  CO2 17*  --  17* 18*  GLUCOSE 24*  --  92 195*  BUN 19  --  20 27*  CREATININE 2.85*  --  2.76* 3.38*  CALCIUM 8.1*  --  8.3* 8.3*  MG   --  1.5* 1.8 2.5*     GFR: Estimated Creatinine Clearance: 19.3 mL/min (A) (by C-G formula based on SCr of 3.38 mg/dL (H)).  Liver Function Tests: Recent Labs  Lab 11/01/22 1214  AST 22  ALT 10  ALKPHOS 124  BILITOT 0.8  PROT 6.2*  ALBUMIN 2.0*    No results for input(s): "LIPASE", "AMYLASE" in the last 168 hours. No results for input(s): "AMMONIA" in the last 168 hours.  Coagulation Profile: No results for input(s): "INR", "PROTIME" in the last 168 hours.  Cardiac Enzymes: No results for input(s): "CKTOTAL", "CKMB", "CKMBINDEX", "TROPONINI" in the last 168 hours.  BNP (last 3 results) No results for input(s): "PROBNP" in the last 8760 hours.  Lipid Profile: No results for input(s): "  CHOL", "HDL", "LDLCALC", "TRIG", "CHOLHDL", "LDLDIRECT" in the last 72 hours.  Thyroid Function Tests: Recent Labs    11/01/22 0911  TSH 0.646     Anemia Panel: Recent Labs    11/01/22 0911 11/01/22 1855  VITAMINB12 523  --   FERRITIN  --  159  TIBC  --  203*  IRON  --  76     Urine analysis:    Component Value Date/Time   COLORURINE YELLOW 11/01/2022 0345   APPEARANCEUR HAZY (A) 11/01/2022 0345   LABSPEC 1.004 (L) 11/01/2022 0345   PHURINE 7.0 11/01/2022 0345   GLUCOSEU NEGATIVE 11/01/2022 0345   HGBUR NEGATIVE 11/01/2022 0345   BILIRUBINUR NEGATIVE 11/01/2022 0345   KETONESUR NEGATIVE 11/01/2022 0345   PROTEINUR >=300 (A) 11/01/2022 0345   UROBILINOGEN 0.2 01/29/2014 2116   NITRITE NEGATIVE 11/01/2022 0345   LEUKOCYTESUR MODERATE (A) 11/01/2022 0345    Sepsis Labs: Lactic Acid, Venous    Component Value Date/Time   LATICACIDVEN 1.2 05/09/2022 1154    MICROBIOLOGY: No results found for this or any previous visit (from the past 240 hour(s)).  RADIOLOGY STUDIES/RESULTS: DG ESOPHAGUS W SINGLE CM (SOL OR THIN BA)  Result Date: 11/02/2022 CLINICAL DATA:  62 year old with history of chronic solid food dysphagia. EXAM: ESOPHAGUS/BARIUM SWALLOW/TABLET STUDY  TECHNIQUE: Single contrast examination was performed using thin liquid barium. This exam was performed by Brynda Greathouse PA-C, and was supervised and interpreted by Logan Bores, MD. FLUOROSCOPY: Radiation Exposure Index (as provided by the fluoroscopic device): 16.5 mGy Kerma COMPARISON:  Modified barium swallow 11/01/2022. FINDINGS: The examination was limited due to the patient's clinical status, decreased mobility, and difficulty tolerating the examination. Swallowing: More fully evaluated on yesterday's modified barium swallow. No aspiration was observed today. The patient reported discomfort with swallowing and required multiple swallows per bolus. Pharynx: Prominent cricopharyngeus. Esophagus: Apparent moderate, smooth narrowing at the gastroesophageal junction. Mildly patulous appearance of the esophagus more proximally. Esophageal motility: Stasis of contrast in the lower esophagus with some to and fro motion and frequent tertiary contractions. Delayed passage of barium through the gastroesophageal junction. Hiatal Hernia: None. Gastroesophageal reflux: None visualized. Ingested 13 mm barium tablet: Not given IMPRESSION: 1. Possible stricture at the gastroesophageal junction with delayed barium passage. 2. Esophageal dysmotility with prominent tertiary contractions. Electronically Signed   By: Logan Bores M.D.   On: 11/02/2022 10:31   DG Swallowing Func-Speech Pathology  Result Date: 11/01/2022 Table formatting from the original result was not included. Modified Barium Swallow Study Patient Details Name: Mikele Braniff MRN: [id number] Date of Birth: March 18, 1961 Today's Date: 11/01/2022 HPI/PMH: HPI: Jaqueta Kasai is a 62 y.o. female who presented to the emergency department for episode of hypoglycemia with BGL 27 on arrival. 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 During oral phase, patient did exhibit anterior to posterior transit delays. Swallow was initiated at level of pyriform sinus with both thin and nectar thick liquids with patient gagging during the swallow and exhibiting belching afterwards. Prominent cricopharyngeal bar observed which did appear to impact bolus transit. No penetration or aspiration observed with thin liquids. With nectar thick liquid via spoon sip, patient started gagging during the swallow, which led to  coughing and penetration of nectar, but no aspiration. SLP then ended the study as patient continued to cough and started gasping for air. She did calm down and although she continued to belch and cough, no regurgitation observed. SLP consulted with MD and both in agreement for GI referral but in the meantime, to initiate a PO diet of clear liquids (thin consistency) and meds whole in puree. SLP will follow briefly for toleration. Factors that may increase risk of adverse event in presence of aspiration ([place] 2021): No data recorded Recommendations/Plan: Swallowing Evaluation Recommendations Swallowing Evaluation Recommendations Recommendations: PO diet PO Diet Recommendation: Thin liquids (Level 0) Liquid Administration via: Cup; Straw Medication Administration: Whole meds with puree Supervision: Patient able to self-feed Swallowing strategies  : Slow rate; Small bites/sips Postural changes: Position pt fully upright for meals; Stay upright 30-60 min after meals Oral care recommendations: Oral care BID (2x/day) Recommended consults: Consider GI consultation Treatment Plan Treatment Plan Treatment recommendations: Therapy as outlined in  treatment plan below Follow-up recommendations: Follow physicians's recommendations for discharge plan and follow up therapies Functional status assessment: Patient has had a recent decline in their functional status and demonstrates the ability to make significant improvements in function in a reasonable and predictable amount of time. Treatment frequency: Min 1x/week Treatment duration: 1 week Interventions: Patient/family education; Diet toleration management by SLP Recommendations Recommendations for follow up therapy are one component of a multi-disciplinary discharge planning process, led by the attending physician.  Recommendations may be updated based on patient status, additional functional criteria and insurance authorization. Assessment: Orofacial Exam: Orofacial Exam Oral Cavity: Oral Hygiene: WFL Oral Cavity - Dentition: Missing dentition Orofacial Anatomy: WFL Oral Motor/Sensory Function: WFL Anatomy: Anatomy: WFL Thin Liquids: Thin Liquids (Level 0) Thin Liquids : Impaired Bolus delivery method: Cup; Spoon Thin Liquid - Impairment: Oral Impairment; Pharyngeal impairment Lip Closure: No labial escape Tongue control during bolus hold: Cohesive bolus between tongue to palatal seal Bolus transport/lingual motion: Slow tongue motion Oral residue: Complete oral clearance Location of oral residue : N/A Initiation of swallow : Pyriform sinuses Soft palate elevation: No bolus between soft palate (SP)/pharyngeal wall (PW) Laryngeal elevation: Complete superior movement of thyroid cartilage with complete approximation of arytenoids to epiglottic petiole Anterior hyoid excursion: Complete Epiglottic movement: Complete Laryngeal vestibule closure: Complete, no air/contrast in laryngeal vestibule Pharyngeal stripping wave : Present - complete Pharyngeal contraction (A/P view only): N/A Pharyngoesophageal segment opening: Partial distention/partial duration, partial obstruction of flow Tongue base retraction: No  contrast between tongue base and posterior pharyngeal wall (PPW) Pharyngeal residue: Complete pharyngeal clearance Location of pharyngeal residue: N/A Penetration/Aspiration Scale (PAS) score: 1.  Material does not enter airway  Mildly Thick Liquids: Mildly thick liquids (Level 2, nectar thick) Mildly thick liquids (Level 2, nectar thick): Impaired Bolus delivery method: Spoon Mildly Thick Liquid - Impairment: Pharyngeal impairment Initiation of swallow : Pyriform sinuses Soft palate elevation: No bolus between soft palate (SP)/pharyngeal wall (PW) Laryngeal elevation: Complete superior movement of thyroid cartilage with complete approximation of arytenoids to epiglottic petiole Anterior hyoid excursion: Complete Epiglottic movement: Complete Laryngeal vestibule closure: Complete, no air/contrast in laryngeal vestibule Pharyngeal stripping wave : Present - complete Pharyngeal contraction (A/P view only): N/A Pharyngoesophageal segment opening: Partial distention/partial duration, partial obstruction of flow Tongue base retraction: No contrast between tongue base and posterior pharyngeal wall (PPW) Pharyngeal residue: Complete pharyngeal clearance Location of pharyngeal residue: N/A Penetration/Aspiration Scale (PAS) score: 2.  Material enters airway, remains ABOVE vocal cords then ejected out  Moderately Thick Liquids: Moderately thick liquids (Level 3, honey thick) Moderately thick liquids (Level 3, honey thick): Not Tested  Puree: Puree Puree: Not Tested Solid: Solid Solid: Not Tested Pill: Pill Pill: Not Tested Compensatory Strategies: No data recorded  General Information: Caregiver present: No  Diet Prior to this Study: NPO   Temperature : Normal   Respiratory Status: WFL   Supplemental O2: None (Room air)   History of Recent Intubation: No  Behavior/Cognition: Alert; Cooperative Self-Feeding Abilities: Able to self-feed Baseline vocal quality/speech: Normal No data recorded Volitional Swallow: Able to elicit  Exam Limitations: Other (comment) (gagging of PO's) Goal Planning: Prognosis for improved oropharyngeal function: Fair Barriers to Reach Goals: Time post onset; Severity of deficits No data recorded Patient/Family Stated Goal: c/o diffuculty swallowing Consulted and agree with results and recommendations: Patient Pain: Pain Assessment Pain Assessment: No/denies pain Faces Pain Scale: 0 End of Session: Start Time:SLP Start Time (ACUTE ONLY): 1330 Stop Time: SLP Stop Time (ACUTE ONLY): 1350 Time Calculation:SLP Time Calculation (min) (ACUTE ONLY): 20 min Charges: SLP Evaluations $ SLP Speech Visit: 1 Visit SLP Evaluations $BSS Swallow: 1 Procedure $MBS Swallow: 1 Procedure SLP visit diagnosis: SLP Visit Diagnosis: Dysphagia, pharyngoesophageal phase (R13.14) Past Medical History: Past Medical History: Diagnosis Date  Abnormal liver function tests   Acute kidney injury ([place]) 01/2014  Hospitalized, Volume depletion, nausea and vomiting  Anxiety   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  Chronic back pain   Depression   Diabetes mellitus   GERD (gastroesophageal reflux disease)   Heart murmur   Hypertension   NSTEMI (non-ST elevated myocardial infarction) ([place]) 12/20/2021  Palpitations   Parkinson's disease  Past Surgical History: Past Surgical History: Procedure Laterality Date  CARPAL TUNNEL RELEASE    IR FLUORO GUIDE CV LINE RIGHT  01/26/2022  IR US GUIDE VASC ACCESS RIGHT  01/26/2022  LEFT HEART CATH AND CORONARY ANGIOGRAPHY N/A 12/20/2021  Procedure: LEFT HEART CATH AND CORONARY ANGIOGRAPHY;  Surgeon: Burnell Blanks, MD;  Location: [facility];  Service: Cardiovascular;  Laterality: N/A;  OPEN REDUCTION INTERNAL FIXATION (ORIF) FINGER WITH RADIAL BONE GRAFT Right 09/12/2015  Procedure: RIGHT MIDDLE FINGER CLOSED REDUCTION AND PINNING;  Surgeon: Iran Planas, MD;  Location: [facility];  Service: Orthopedics;  Laterality: Right;  TONSILLECTOMY    TUBAL LIGATION   Sonia Baller, MA, CCC-SLP Speech Therapy      LOS: 1 day   Oren Binet, MD  [facility]    To contact the attending provider between 7A-7P or the covering provider during after hours 7P-7A, please log into the web site [email or web address] and access using universal [place] password for that web site. If you do not have the password, please call the hospital operator.  11/03/2022, 10:59 AM

## 2022-11-04 ENCOUNTER — Inpatient Hospital Stay (HOSPITAL_COMMUNITY): Payer: 59

## 2022-11-04 ENCOUNTER — Encounter (HOSPITAL_COMMUNITY)
Admission: EM | Disposition: A | Discharge status: Home Health | Payer: Self-pay | Source: Home / Self Care | Attending: Internal Medicine

## 2022-11-04 DIAGNOSIS — Z7902 Long term (current) use of antithrombotics/antiplatelets: Secondary | ICD-10-CM

## 2022-11-04 DIAGNOSIS — R1319 Other dysphagia: Secondary | ICD-10-CM

## 2022-11-04 DIAGNOSIS — R933 Abnormal findings on diagnostic imaging of other parts of digestive tract: Secondary | ICD-10-CM

## 2022-11-04 DIAGNOSIS — Z1211 Encounter for screening for malignant neoplasm of colon: Secondary | ICD-10-CM | POA: Diagnosis not present

## 2022-11-04 LAB — GLUCOSE, CAPILLARY
Glucose-Capillary: 132 mg/dL — ABNORMAL HIGH (ref 70–99)
Glucose-Capillary: 233 mg/dL — ABNORMAL HIGH (ref 70–99)
Glucose-Capillary: 169 mg/dL — ABNORMAL HIGH (ref 70–99)
Glucose-Capillary: 120 mg/dL — ABNORMAL HIGH (ref 70–99)

## 2022-11-04 LAB — BASIC METABOLIC PANEL
Anion gap: 7 (ref 5–15)
BUN: 36 mg/dL — ABNORMAL HIGH (ref 6–20)
CO2: 18 mmol/L — ABNORMAL LOW (ref 22–32)
Calcium: 8.2 mg/dL — ABNORMAL LOW (ref 8.9–10.3)
Chloride: 108 mmol/L (ref 98–111)
Creatinine, Ser: 3.74 mg/dL — ABNORMAL HIGH (ref 0.44–1.00)
GFR, Estimated: 13 mL/min — ABNORMAL LOW (ref >60)
Glucose, Bld: 133 mg/dL — ABNORMAL HIGH (ref 70–99)
Potassium: 5.1 mmol/L (ref 3.5–5.1)
Sodium: 133 mmol/L — ABNORMAL LOW (ref 135–145)

## 2022-11-04 LAB — MAGNESIUM: Magnesium: 2.3 mg/dL (ref 1.7–2.4)

## 2022-11-04 SURGERY — EGD (ESOPHAGOGASTRODUODENOSCOPY)
Anesthesia: Monitor Anesthesia Care

## 2022-11-04 MED ORDER — SODIUM CHLORIDE 0.9 % IV SOLN: INTRAVENOUS | Status: DC

## 2022-11-04 NOTE — Plan of Care (Signed)

## 2022-11-04 NOTE — Progress Notes (Signed)
PROGRESS NOTE        PATIENT DETAILS Name: Carol [name] Age: 62 y.o. Sex: female Date of Birth: 1961/05/02 Admit Date: 11/01/2022 Admitting Physician Evalee Mutton Kristeen Mans, MD [id number], [name], NP  Brief Summary: Patient is a 62 y.o.  female chronic HFpEF, CAD-being medically managed, DM-2, HTN, stage IV CKD, crack cocaine use-presented with acute metabolic encephalopathy in the setting of hypoglycemia.  Patient also acknowledged solid food dysphagia for close to a year.  She was subsequently admitted to the hospitalist service.  Significant events: 3/14>> admit to [facility]  Significant studies: None  Significant microbiology data: None  Procedures: None  Consults: GI Psych  Subjective: History of some mild lower extremity edema.  Breathing is okay.  No major issues overnight.  Objective: Vitals: Blood pressure (!) 150/82, pulse 85, temperature 97.7 F (36.5 C), temperature source Oral, resp. rate 18, height 5\' 4"  (1.626 m), weight 92.9 kg, SpO2 99 %.   Exam: Gen Exam:Alert awake-not in any distress HEENT:atraumatic, normocephalic Chest: B/L clear to auscultation anteriorly CVS:S1S2 regular Abdomen:soft non tender, non distended Extremities:+ edema Neurology: Non focal Skin: no rash  Pertinent Labs/Radiology:    Latest Ref Rng & Units 11/02/2022    8:06 AM 11/01/2022    4:44 AM 07/22/2022    5:04 AM  CBC  WBC 4.0 - 10.5 K/uL 4.6  4.8  4.7   Hemoglobin 12.0 - 15.0 g/dL 9.9  9.6  10.8   Hematocrit 36.0 - 46.0 % 30.9  29.0  31.4   Platelets 150 - 400 K/uL 211  186  222     Lab Results  Component Value Date   NA 133 (L) 11/04/2022   K 5.1 11/04/2022   CL 108 11/04/2022   CO2 18 (L) 11/04/2022      Assessment/Plan: DM-2 with hypoglycemia (A1C6.2 on 3/14) Recurrent issue-similar hospitalization December 2023-has had similar hospitalizations in the past as well Home regimen includes Lantus 5 units daily (but she is taking 12  units daily), metformin and glipizide.  Given severity of hypoglycemia-suspect can stop glipizide and Lantus and just maintain on metformin at the time of discharge. CBGs relatively stable on resistant SSI  Needs extensive diabetic education-Per outpatient cardiology note-patient has limited reading ability-finish 12 grade but did not pass-she apparently was in special needs classes.  This may be the reason why she has recurrent hypoglycemia.   Recent Labs    11/03/22 1620 11/03/22 2017 11/04/22 0832  GLUCAP 219* 235* 132*      Dysphagia Mostly to solids-ongoing x 1 year Bili esophagogram confirms liver esophageal sphincter GI following for potential EGD/dilatation on 3/19-once Plavix washout is complete.  CAD with history of non-STEMI 4/23 (LHC with occluded small sub-branch of PDA-treated medically) No anginal symptoms currently Hold Plavix-as patient may need EGD/dilatation. Continue aspirin/statin Suspect not on beta-blocker given history of active cocaine use  Acute on chronic HFpEF Some lower extremity edema today Due to rising creatinine-will continue to hold Demadex Reassess 3/18  Prolonged QTc interval Telemetry monitoring Keep K> 4, Mg> 2 QTc improved on 3/16-with borderline QTc prolongation Avoid QTc prolonging agents  Hypomagnesemia Repleted.  HTN BP now stable-was somewhat soft 3/16 Dosage of amlodipine/hydralazine was reduced Continue current dosage of Imdur/amlodipine/hydralazine Reassess 3/18.    AKI on CKD 4 Creatinine continues to slowly uptrend Continue to hold Demadex Frequent bladder scans to ensure  no retention Renal ultrasound Follow electrolytes Avoid nephrotoxic agents   History of liver sarcoidosis Stable LFTs Resume outpatient follow-up with GI  Anxiety/depression Stable Given prolonged QTc Psych consulted-Zoloft/Seroquel discontinued-now on Remeron  Obesity: Estimated body mass index is 35.16 kg/m as calculated from the  following:   Height as of this encounter: 5\' 4"  (1.626 m).   Weight as of this encounter: 92.9 kg.   Code status:   Code Status: Full Code   DVT Prophylaxis: enoxaparin (LOVENOX) injection 30 mg Start: 11/01/22 1045 Place TED hose Start: 11/01/22 1007    Family Communication: Significant other at bedside   Disposition Plan: Status is: Observation The patient will require care spanning > 2 midnights and should be moved to inpatient because: Severity of illness   Planned Discharge Destination:Home   Diet: Diet Order             Diet NPO time specified  Diet effective midnight           Diet clear liquid Room service appropriate? Yes; Fluid consistency: Thin  Diet effective 0500           Diet full liquid Room service appropriate? Yes; Fluid consistency: Thin  Diet effective now                     Antimicrobial agents: Anti-infectives (From admission, onward)    None        MEDICATIONS: Scheduled Meds:  amLODipine  5 mg Oral Daily   aspirin EC  81 mg Oral Daily   atorvastatin  80 mg Oral Daily   enoxaparin (LOVENOX) injection  30 mg Subcutaneous Q24H   fluticasone furoate-vilanterol  1 puff Inhalation Daily   gabapentin  100 mg Oral QHS   hydrALAZINE  25 mg Oral Q8H   hydrOXYzine  50 mg Oral QHS   insulin aspart  0-20 Units Subcutaneous TID WC   isosorbide mononitrate  15 mg Oral Daily   lidocaine  1 patch Transdermal Q24H   mirtazapine  7.5 mg Oral QHS   pantoprazole (PROTONIX) IV  40 mg Intravenous Daily   polyethylene glycol  17 g Oral Daily   sodium chloride flush  3 mL Intravenous Q12H   Continuous Infusions:  sodium chloride     PRN Meds:.sodium chloride, acetaminophen **OR** acetaminophen, albuterol, docusate sodium, hydrALAZINE, melatonin, sodium chloride flush   I have personally reviewed following labs and imaging studies  LABORATORY DATA: CBC: Recent Labs  Lab 11/01/22 0444 11/02/22 0806  WBC 4.8 4.6  HGB 9.6* 9.9*  HCT 29.0*  30.9*  MCV 90.3 90.1  PLT 186 211     Basic Metabolic Panel: Recent Labs  Lab 11/01/22 0444 11/01/22 1214 11/02/22 0806 11/03/22 0348 11/04/22 0425  NA 136  --  132* 132* 133*  K 3.7  --  4.0 5.2* 5.1  CL 110  --  106 105 108  CO2 17*  --  17* 18* 18*  GLUCOSE 24*  --  92 195* 133*  BUN 19  --  20 27* 36*  CREATININE 2.85*  --  2.76* 3.38* 3.74*  CALCIUM 8.1*  --  8.3* 8.3* 8.2*  MG  --  1.5* 1.8 2.5* 2.3     GFR: Estimated Creatinine Clearance: 17.7 mL/min (A) (by C-G formula based on SCr of 3.74 mg/dL (H)).  Liver Function Tests: Recent Labs  Lab 11/01/22 1214  AST 22  ALT 10  ALKPHOS 124  BILITOT 0.8  PROT 6.2*  ALBUMIN 2.0*  No results for input(s): "LIPASE", "AMYLASE" in the last 168 hours. No results for input(s): "AMMONIA" in the last 168 hours.  Coagulation Profile: No results for input(s): "INR", "PROTIME" in the last 168 hours.  Cardiac Enzymes: No results for input(s): "CKTOTAL", "CKMB", "CKMBINDEX", "TROPONINI" in the last 168 hours.  BNP (last 3 results) No results for input(s): "PROBNP" in the last 8760 hours.  Lipid Profile: No results for input(s): "CHOL", "HDL", "LDLCALC", "TRIG", "CHOLHDL", "LDLDIRECT" in the last 72 hours.  Thyroid Function Tests: No results for input(s): "TSH", "T4TOTAL", "FREET4", "T3FREE", "THYROIDAB" in the last 72 hours.   Anemia Panel: Recent Labs    11/01/22 1855  FERRITIN 159  TIBC 203*  IRON 76     Urine analysis:    Component Value Date/Time   COLORURINE YELLOW 11/01/2022 0345   APPEARANCEUR HAZY (A) 11/01/2022 0345   LABSPEC 1.004 (L) 11/01/2022 0345   PHURINE 7.0 11/01/2022 0345   GLUCOSEU NEGATIVE 11/01/2022 0345   HGBUR NEGATIVE 11/01/2022 0345   BILIRUBINUR NEGATIVE 11/01/2022 0345   KETONESUR NEGATIVE 11/01/2022 0345   PROTEINUR >=300 (A) 11/01/2022 0345   UROBILINOGEN 0.2 01/29/2014 2116   NITRITE NEGATIVE 11/01/2022 0345   LEUKOCYTESUR MODERATE (A) 11/01/2022 0345    Sepsis  Labs: Lactic Acid, Venous    Component Value Date/Time   LATICACIDVEN 1.2 05/09/2022 1154    MICROBIOLOGY: No results found for this or any previous visit (from the past 240 hour(s)).  RADIOLOGY STUDIES/RESULTS: No results found.   LOS: 2 days   Oren Binet, MD  [facility]    To contact the attending provider between 7A-7P or the covering provider during after hours 7P-7A, please log into the web site [email or web address] and access using universal [place] password for that web site. If you do not have the password, please call the hospital operator.  11/04/2022, 10:08 AM

## 2022-11-05 ENCOUNTER — Encounter (HOSPITAL_COMMUNITY): Payer: 59

## 2022-11-05 DIAGNOSIS — R933 Abnormal findings on diagnostic imaging of other parts of digestive tract: Secondary | ICD-10-CM | POA: Diagnosis not present

## 2022-11-05 DIAGNOSIS — Z7902 Long term (current) use of antithrombotics/antiplatelets: Secondary | ICD-10-CM | POA: Diagnosis not present

## 2022-11-05 DIAGNOSIS — R1319 Other dysphagia: Secondary | ICD-10-CM | POA: Diagnosis not present

## 2022-11-05 DIAGNOSIS — Z1211 Encounter for screening for malignant neoplasm of colon: Secondary | ICD-10-CM | POA: Diagnosis not present

## 2022-11-05 LAB — BASIC METABOLIC PANEL
Anion gap: 7 (ref 5–15)
BUN: 41 mg/dL — ABNORMAL HIGH (ref 6–20)
CO2: 19 mmol/L — ABNORMAL LOW (ref 22–32)
Calcium: 8.2 mg/dL — ABNORMAL LOW (ref 8.9–10.3)
Chloride: 106 mmol/L (ref 98–111)
Creatinine, Ser: 3.67 mg/dL — ABNORMAL HIGH (ref 0.44–1.00)
GFR, Estimated: 14 mL/min — ABNORMAL LOW (ref >60)
Glucose, Bld: 134 mg/dL — ABNORMAL HIGH (ref 70–99)
Potassium: 5.7 mmol/L — ABNORMAL HIGH (ref 3.5–5.1)
Sodium: 132 mmol/L — ABNORMAL LOW (ref 135–145)

## 2022-11-05 LAB — GLUCOSE, CAPILLARY
Glucose-Capillary: 104 mg/dL — ABNORMAL HIGH (ref 70–99)
Glucose-Capillary: 174 mg/dL — ABNORMAL HIGH (ref 70–99)
Glucose-Capillary: 161 mg/dL — ABNORMAL HIGH (ref 70–99)
Glucose-Capillary: 141 mg/dL — ABNORMAL HIGH (ref 70–99)

## 2022-11-05 MED ORDER — PEG-KCL-NACL-NASULF-NA ASC-C 100 G PO SOLR
0.5000 | Freq: Once | ORAL | Status: AC
  Administered 2022-11-05: 100 g via ORAL
  Filled 2022-11-05: qty 1

## 2022-11-05 MED ORDER — PEG-KCL-NACL-NASULF-NA ASC-C 100 G PO SOLR: 1.0000 | Freq: Once | ORAL | Status: DC

## 2022-11-05 MED ORDER — LINAGLIPTIN 5 MG PO TABS
5.0000 mg | ORAL_TABLET | Freq: Every day | ORAL | Status: DC
  Administered 2022-11-05 – 2022-11-07 (×3): 5 mg via ORAL
  Filled 2022-11-05 (×3): qty 1

## 2022-11-05 MED ORDER — SODIUM CHLORIDE 0.9 % IV SOLN: INTRAVENOUS | Status: DC

## 2022-11-05 MED ORDER — SODIUM ZIRCONIUM CYCLOSILICATE 10 G PO PACK
10.0000 g | PACK | Freq: Two times a day (BID) | ORAL | Status: AC
  Administered 2022-11-05 (×2): 10 g via ORAL
  Filled 2022-11-05 (×2): qty 1

## 2022-11-05 MED ORDER — PEG-KCL-NACL-NASULF-NA ASC-C 100 G PO SOLR
0.5000 | Freq: Once | ORAL | Status: AC
  Administered 2022-11-05: 100 g via ORAL

## 2022-11-05 NOTE — H&P (View-Only) (Signed)
   Patient Name: Carol [name] Date of Encounter: 11/05/2022, 3:13 PM Primary GI  Dr.  Carlean Purl    Subjective   Plan for EGD/Colon tomorrow after plavix wash out   Objective  BP (!) 156/78   Pulse 96   Temp (!) 97.1 F (36.2 C) (Axillary)   Resp 17   Ht 5\' 4"  (1.626 m)   Wt 91.1 kg   SpO2 100%   BMI 34.47 kg/m      Assessment    Chronic solid food dysphagia.  Without weight loss. Barium swallow shows esophageal dysmotility and possibly stricture at GE junction.    Chronic [name] (since May 2023).  Possibly anemia of chronic disease. Iron studies not suggestive of iron deficiency.  Hgb 9.9 stable. Recent Labs    03/25/22 0344 03/26/22 0402 03/27/22 0718 05/09/22 0353 05/10/22 0643 07/21/22 0941 07/21/22 1715 07/22/22 0504 11/01/22 0444 11/02/22 0806  HGB 8.8* 8.4* 9.1* 11.0* 9.5* 10.4* 8.8* 10.8* 9.6* 9.9*   Borderline prolonged QT interval   CKD 4 with hyperkalemia Potassium 5.7 ReceivedLokelma x 2 today   Chronic diastolic heart failure.  BNP 1,401   CAD, on plavix + asa Last dose of plavix was 3/14.    Plan   On for scheduled tomorrow with endoscopy with possible dilatation and colonoscopy after Plavix washout Prep today, clear liquids today NPO at midnight Potassium 5.7, recheck prior to endoscopic evaluation tomorrow  Principal Problem:   Diabetic hypoglycemia ([name]) Active Problems:   Essential hypertension   CAD in native artery   Anxiety with depression   CKD (chronic kidney disease) stage 4, GFR 15-29 ml/min (HCC)   Cocaine use disorder, mild, abuse (HCC)   borderline Prolonged QT interval   Chronic diastolic CHF (congestive heart failure) ([name])   Dysphagia   Hypoglycemia   MDD (major depressive disorder), recurrent episode, moderate (HCC)   Abnormal barium swallow   Esophageal stricture   Esophageal dysmotility   Antiplatelet or antithrombotic long-term use   Colon cancer screening    LOS: 3 days   Carol [name]  11/05/2022, 2:58 PM

## 2022-11-05 NOTE — Progress Notes (Signed)
PROGRESS NOTE        PATIENT DETAILS Name: Carol [name] Age: 62 y.o. Sex: female Date of Birth: [birth year] Admit Date: 11/01/2022 Admitting Physician Carol Mutton Kristeen Mans, MD [id number], Carol Schneiders, NP  Brief Summary: Patient is a 62 y.o.  female chronic HFpEF, CAD-being medically managed, DM-2, HTN, stage IV CKD, crack cocaine use-presented with acute metabolic encephalopathy in the setting of hypoglycemia.  Patient also acknowledged solid food dysphagia for close to a year.  She was subsequently admitted to the hospitalist service.  Significant events: 3/14>> admit to [facility]  Significant studies: 3/15>> barium esophagogram: Possible stricture at the GE junction.  Esophageal dysmotility 3/17>> renal ultrasound: No cysts   Significant microbiology data: None  Procedures: None  Consults: GI Psych  Subjective: No major issues overnight-understand that she will undergo EGD/colonoscopy 3/19.  Objective: Vitals: Blood pressure (!) 148/73, pulse 86, temperature 97.9 F (36.6 C), temperature source Oral, resp. rate 20, height 5\' 4"  (1.626 m), weight 91.1 kg, SpO2 99 %.   Exam: Gen Exam:Alert awake-not in any distress HEENT:atraumatic, normocephalic Chest: B/L clear to auscultation anteriorly CVS:S1S2 regular Abdomen:soft non tender, non distended Extremities:+ edema Neurology: Non focal Skin: no rash  Pertinent Labs/Radiology:    Latest Ref Rng & Units 11/02/2022    8:06 AM 11/01/2022    4:44 AM 07/22/2022    5:04 AM  CBC  WBC 4.0 - 10.5 K/uL 4.6  4.8  4.7   Hemoglobin 12.0 - 15.0 g/dL 9.9  9.6  10.8   Hematocrit 36.0 - 46.0 % 30.9  29.0  31.4   Platelets 150 - 400 K/uL 211  186  222     Lab Results  Component Value Date   NA 132 (L) 11/05/2022   K 5.7 (H) 11/05/2022   CL 106 11/05/2022   CO2 19 (L) 11/05/2022      Assessment/Plan: DM-2 with hypoglycemia (A1C6.2 on 3/14) Recurrent issue-similar hospitalization December 2023-has  had similar hospitalizations in the past as well Home regimen includes Lantus 5 units daily (but she is taking 12 units daily), metformin and glipizide.  Given severity of hypoglycemia-suspect can stop glipizide and Lantus and just maintain on metformin at the time of discharge. CBGs relatively stable on resistant SSI  Needs extensive diabetic education-Per outpatient cardiology note-patient has limited reading ability-finish 12 grade but did not pass-she apparently was in special needs classes.  This may be the reason why she has recurrent hypoglycemia.   Recent Labs    11/04/22 1646 11/04/22 2030 11/05/22 0814  GLUCAP 169* 120* 104*      Dysphagia Mostly to solids-ongoing x 1 year Bili esophagogram confirms liver esophageal sphincter GI following for potential EGD/dilatation on 3/19-once Plavix washout is complete.  CAD with history of non-STEMI 4/23 (LHC with occluded small sub-branch of PDA-treated medically) No anginal symptoms currently Hold Plavix-as patient may need EGD/dilatation. Continue aspirin/statin Suspect not on beta-blocker given history of active cocaine use  Acute on chronic HFpEF Continues to have lower extremity edema Creatinine has plateaued and is slowly downtrending She is not short of breath-lungs are clear-on room air She will be starting her colonoscopy prep today-will hold off on resuming diuretics Reassess on 3/19.    Prolonged QTc interval Telemetry monitoring Keep K> 4, Mg> 2 QTc improved on 3/16-with borderline QTc prolongation Avoid QTc prolonging agents  Hypomagnesemia Repleted.  Hyperkalemia Mild  In the setting of worsening renal function Lokelma x 2 doses today Repeat electrolytes tomorrow  HTN BP now stable-was somewhat soft 3/16 Dosage of amlodipine/hydralazine was reduced Continue current dosage of Imdur/amlodipine/hydralazine Reassess 3/18.    AKI on CKD 4 Suspect AKI hemodynamically mediated-blood pressure was somewhat  soft several days back. Creatinine now has plateaued Continue to hold Demadex Frequent bladder scans to ensure no retention Renal ultrasound without any hydronephrosis-per nursing staff-adequate urine output Follow electrolytes Avoid nephrotoxic agents   History of liver sarcoidosis Stable LFTs Resume outpatient follow-up with GI  Anxiety/depression Stable Given prolonged QTc Psych consulted-Zoloft/Seroquel discontinued-now on Remeron  Obesity: Estimated body mass index is 34.47 kg/m as calculated from the following:   Height as of this encounter: 5\' 4"  (1.626 m).   Weight as of this encounter: 91.1 kg.   Code status:   Code Status: Full Code   DVT Prophylaxis: enoxaparin (LOVENOX) injection 30 mg Start: 11/01/22 1045 Place TED hose Start: 11/01/22 1007    Family Communication: Significant other at bedside   Disposition Plan: Status is: Observation The patient will require care spanning > 2 midnights and should be moved to inpatient because: Severity of illness   Planned Discharge Destination:Home   Diet: Diet Order             Diet NPO time specified  Diet effective midnight           Diet clear liquid Room service appropriate? Yes; Fluid consistency: Thin  Diet effective 0500                     Antimicrobial agents: Anti-infectives (From admission, onward)    None        MEDICATIONS: Scheduled Meds:  amLODipine  5 mg Oral Daily   aspirin EC  81 mg Oral Daily   atorvastatin  80 mg Oral Daily   enoxaparin (LOVENOX) injection  30 mg Subcutaneous Q24H   fluticasone furoate-vilanterol  1 puff Inhalation Daily   gabapentin  100 mg Oral QHS   hydrALAZINE  25 mg Oral Q8H   hydrOXYzine  50 mg Oral QHS   insulin aspart  0-20 Units Subcutaneous TID WC   isosorbide mononitrate  15 mg Oral Daily   lidocaine  1 patch Transdermal Q24H   mirtazapine  7.5 mg Oral QHS   pantoprazole (PROTONIX) IV  40 mg Intravenous Daily   polyethylene glycol  17 g  Oral Daily   sodium chloride flush  3 mL Intravenous Q12H   sodium zirconium cyclosilicate  10 g Oral BID   Continuous Infusions:  sodium chloride     PRN Meds:.sodium chloride, acetaminophen **OR** acetaminophen, albuterol, docusate sodium, hydrALAZINE, melatonin, sodium chloride flush   I have personally reviewed following labs and imaging studies  LABORATORY DATA: CBC: Recent Labs  Lab 11/01/22 0444 11/02/22 0806  WBC 4.8 4.6  HGB 9.6* 9.9*  HCT 29.0* 30.9*  MCV 90.3 90.1  PLT 186 211     Basic Metabolic Panel: Recent Labs  Lab 11/01/22 0444 11/01/22 1214 11/02/22 0806 11/03/22 0348 11/04/22 0425 11/05/22 0312  NA 136  --  132* 132* 133* 132*  K 3.7  --  4.0 5.2* 5.1 5.7*  CL 110  --  106 105 108 106  CO2 17*  --  17* 18* 18* 19*  GLUCOSE 24*  --  92 195* 133* 134*  BUN 19  --  20 27* 36* 41*  CREATININE 2.85*  --  2.76* 3.38* 3.74*  3.67*  CALCIUM 8.1*  --  8.3* 8.3* 8.2* 8.2*  MG  --  1.5* 1.8 2.5* 2.3  --      GFR: Estimated Creatinine Clearance: 17.8 mL/min (A) (by C-G formula based on SCr of 3.67 mg/dL (H)).  Liver Function Tests: Recent Labs  Lab 11/01/22 1214  AST 22  ALT 10  ALKPHOS 124  BILITOT 0.8  PROT 6.2*  ALBUMIN 2.0*    No results for input(s): "LIPASE", "AMYLASE" in the last 168 hours. No results for input(s): "AMMONIA" in the last 168 hours.  Coagulation Profile: No results for input(s): "INR", "PROTIME" in the last 168 hours.  Cardiac Enzymes: No results for input(s): "CKTOTAL", "CKMB", "CKMBINDEX", "TROPONINI" in the last 168 hours.  BNP (last 3 results) No results for input(s): "PROBNP" in the last 8760 hours.  Lipid Profile: No results for input(s): "CHOL", "HDL", "LDLCALC", "TRIG", "CHOLHDL", "LDLDIRECT" in the last 72 hours.  Thyroid Function Tests: No results for input(s): "TSH", "T4TOTAL", "FREET4", "T3FREE", "THYROIDAB" in the last 72 hours.   Anemia Panel: No results for input(s): "VITAMINB12", "FOLATE",  "FERRITIN", "TIBC", "IRON", "RETICCTPCT" in the last 72 hours.   Urine analysis:    Component Value Date/Time   COLORURINE YELLOW 11/01/2022 0345   APPEARANCEUR HAZY (A) 11/01/2022 0345   LABSPEC 1.004 (L) 11/01/2022 0345   PHURINE 7.0 11/01/2022 0345   GLUCOSEU NEGATIVE 11/01/2022 0345   HGBUR NEGATIVE 11/01/2022 0345   BILIRUBINUR NEGATIVE 11/01/2022 0345   KETONESUR NEGATIVE 11/01/2022 0345   PROTEINUR >=300 (A) 11/01/2022 0345   UROBILINOGEN 0.2 01/29/2014 2116   NITRITE NEGATIVE 11/01/2022 0345   LEUKOCYTESUR MODERATE (A) 11/01/2022 0345    Sepsis Labs: Lactic Acid, Venous    Component Value Date/Time   LATICACIDVEN 1.2 05/09/2022 1154    MICROBIOLOGY: No results found for this or any previous visit (from the past 240 hour(s)).  RADIOLOGY STUDIES/RESULTS: US RENAL  Result Date: 11/04/2022 CLINICAL DATA:  [id number] AKI (acute kidney injury) ([place]) [id number] EXAM: RENAL / URINARY TRACT ULTRASOUND COMPLETE COMPARISON:  Ultrasound 01/24/2022 FINDINGS: Right Kidney: Renal measurements: 9.6 x 4.7 x 4.0 cm = volume: 96 mL. No hydronephrosis. Increased renal cortical echogenicity. Left Kidney: Renal measurements: 11.3 x 5.3 x 4.3 cm = volume: 136 mL. No hydronephrosis. Increased renal cortical echogenicity. Bladder: Appears normal for degree of bladder distention. Other: None. IMPRESSION: No hydronephrosis. Increased renal cortical echogenicity bilaterally, as can be seen in medical renal disease. Electronically Signed   By: Maurine Simmering M.D.   On: 11/04/2022 13:41     LOS: 3 days   Oren Binet, MD  [facility]    To contact the attending provider between 7A-7P or the covering provider during after hours 7P-7A, please log into the web site [email or web address] and access using universal [place] password for that web site. If you do not have the password, please call the hospital operator.  11/05/2022, 10:23 AM

## 2022-11-05 NOTE — Progress Notes (Signed)
   Patient Name: Carol [name] Date of Encounter: 11/05/2022, 3:13 PM Primary GI  Dr.  Carlean Purl    Subjective   Plan for EGD/Colon tomorrow after plavix wash out   Objective  BP (!) 156/78   Pulse 96   Temp (!) 97.1 F (36.2 C) (Axillary)   Resp 17   Ht 5\' 4"  (1.626 m)   Wt 91.1 kg   SpO2 100%   BMI 34.47 kg/m      Assessment    Chronic solid food dysphagia.  Without weight loss. Barium swallow shows esophageal dysmotility and possibly stricture at GE junction.    Chronic [place] anemia (since May 2023).  Possibly anemia of chronic disease. Iron studies not suggestive of iron deficiency.  Hgb 9.9 stable. Recent Labs    03/25/22 0344 03/26/22 0402 03/27/22 0718 05/09/22 0353 05/10/22 0643 07/21/22 0941 07/21/22 1715 07/22/22 0504 11/01/22 0444 11/02/22 0806  HGB 8.8* 8.4* 9.1* 11.0* 9.5* 10.4* 8.8* 10.8* 9.6* 9.9*   Borderline prolonged QT interval   CKD 4 with hyperkalemia Potassium 5.7 ReceivedLokelma x 2 today   Chronic diastolic heart failure.  BNP 1,401   CAD, on plavix + asa Last dose of plavix was 3/14.    Plan   On for scheduled tomorrow with endoscopy with possible dilatation and colonoscopy after Plavix washout Prep today, clear liquids today NPO at midnight Potassium 5.7, recheck prior to endoscopic evaluation tomorrow  Principal Problem:   Diabetic hypoglycemia ([place]) Active Problems:   Essential hypertension   CAD in native artery   Anxiety with depression   CKD (chronic kidney disease) stage 4, GFR 15-29 ml/min (HCC)   Cocaine use disorder, mild, abuse (HCC)   borderline Prolonged QT interval   Chronic diastolic CHF (congestive heart failure) ([place])   Dysphagia   Hypoglycemia   MDD (major depressive disorder), recurrent episode, moderate (HCC)   Abnormal barium swallow   Esophageal stricture   Esophageal dysmotility   Antiplatelet or antithrombotic long-term use   Colon cancer screening    LOS: 3 days   Vladimir Crofts  11/05/2022, 2:58 PM

## 2022-11-06 ENCOUNTER — Inpatient Hospital Stay (HOSPITAL_COMMUNITY): Payer: 59 | Admitting: Certified Registered Nurse Anesthetist

## 2022-11-06 ENCOUNTER — Encounter (HOSPITAL_COMMUNITY)
Admission: EM | Disposition: A | Discharge status: Home Health | Payer: Self-pay | Source: Home / Self Care | Attending: Internal Medicine

## 2022-11-06 ENCOUNTER — Encounter (HOSPITAL_COMMUNITY): Payer: Self-pay | Admitting: Internal Medicine

## 2022-11-06 DIAGNOSIS — K922 Gastrointestinal hemorrhage, unspecified: Secondary | ICD-10-CM

## 2022-11-06 DIAGNOSIS — F418 Other specified anxiety disorders: Secondary | ICD-10-CM | POA: Diagnosis not present

## 2022-11-06 DIAGNOSIS — E11649 Type 2 diabetes mellitus with hypoglycemia without coma: Secondary | ICD-10-CM | POA: Diagnosis not present

## 2022-11-06 DIAGNOSIS — D500 Iron deficiency anemia secondary to blood loss (chronic): Secondary | ICD-10-CM

## 2022-11-06 DIAGNOSIS — I110 Hypertensive heart disease with heart failure: Secondary | ICD-10-CM

## 2022-11-06 DIAGNOSIS — R9431 Abnormal electrocardiogram [ECG] [EKG]: Secondary | ICD-10-CM | POA: Diagnosis not present

## 2022-11-06 DIAGNOSIS — I2510 Atherosclerotic heart disease of native coronary artery without angina pectoris: Secondary | ICD-10-CM

## 2022-11-06 DIAGNOSIS — R1310 Dysphagia, unspecified: Secondary | ICD-10-CM | POA: Diagnosis not present

## 2022-11-06 DIAGNOSIS — I252 Old myocardial infarction: Secondary | ICD-10-CM

## 2022-11-06 DIAGNOSIS — I509 Heart failure, unspecified: Secondary | ICD-10-CM

## 2022-11-06 DIAGNOSIS — R58 Hemorrhage, not elsewhere classified: Secondary | ICD-10-CM

## 2022-11-06 DIAGNOSIS — F17210 Nicotine dependence, cigarettes, uncomplicated: Secondary | ICD-10-CM

## 2022-11-06 DIAGNOSIS — R195 Other fecal abnormalities: Secondary | ICD-10-CM

## 2022-11-06 HISTORY — PX: COLONOSCOPY WITH PROPOFOL: SHX5780

## 2022-11-06 HISTORY — PX: ESOPHAGOGASTRODUODENOSCOPY (EGD) WITH PROPOFOL: SHX5813

## 2022-11-06 HISTORY — PX: BALLOON DILATION: SHX5330

## 2022-11-06 LAB — GLUCOSE, CAPILLARY
Glucose-Capillary: 106 mg/dL — ABNORMAL HIGH (ref 70–99)
Glucose-Capillary: 113 mg/dL — ABNORMAL HIGH (ref 70–99)
Glucose-Capillary: 189 mg/dL — ABNORMAL HIGH (ref 70–99)
Glucose-Capillary: 143 mg/dL — ABNORMAL HIGH (ref 70–99)
Glucose-Capillary: 162 mg/dL — ABNORMAL HIGH (ref 70–99)

## 2022-11-06 LAB — BASIC METABOLIC PANEL
Anion gap: 9 (ref 5–15)
BUN: 39 mg/dL — ABNORMAL HIGH (ref 6–20)
CO2: 19 mmol/L — ABNORMAL LOW (ref 22–32)
Calcium: 8.6 mg/dL — ABNORMAL LOW (ref 8.9–10.3)
Chloride: 109 mmol/L (ref 98–111)
Creatinine, Ser: 3.56 mg/dL — ABNORMAL HIGH (ref 0.44–1.00)
GFR, Estimated: 14 mL/min — ABNORMAL LOW (ref >60)
Glucose, Bld: 133 mg/dL — ABNORMAL HIGH (ref 70–99)
Potassium: 5.2 mmol/L — ABNORMAL HIGH (ref 3.5–5.1)
Sodium: 137 mmol/L (ref 135–145)

## 2022-11-06 SURGERY — ESOPHAGOGASTRODUODENOSCOPY (EGD) WITH PROPOFOL
Anesthesia: Monitor Anesthesia Care

## 2022-11-06 MED ORDER — PROPOFOL 10 MG/ML IV BOLUS: INTRAVENOUS | Status: DC | PRN

## 2022-11-06 MED ORDER — PROPOFOL 500 MG/50ML IV EMUL
INTRAVENOUS | Status: DC | PRN
  Administered 2022-11-06: 125 ug/kg/min via INTRAVENOUS

## 2022-11-06 MED ORDER — LIDOCAINE 2% (20 MG/ML) 5 ML SYRINGE
INTRAMUSCULAR | Status: DC | PRN
  Administered 2022-11-06: 80 mg via INTRAVENOUS

## 2022-11-06 MED ORDER — CLOPIDOGREL BISULFATE 75 MG PO TABS
75.0000 mg | ORAL_TABLET | Freq: Every day | ORAL | Status: DC
  Administered 2022-11-06 – 2022-11-07 (×2): 75 mg via ORAL
  Filled 2022-11-06 (×2): qty 1

## 2022-11-06 MED ORDER — OXYCODONE HCL 5 MG/5ML PO SOLN: 5.0000 mg | Freq: Once | ORAL | Status: DC | PRN

## 2022-11-06 MED ORDER — PROPOFOL 10 MG/ML IV BOLUS
INTRAVENOUS | Status: DC | PRN
  Administered 2022-11-06 (×2): 10 ug via INTRAVENOUS
  Administered 2022-11-06: 20 ug via INTRAVENOUS

## 2022-11-06 MED ORDER — HYDROMORPHONE HCL 1 MG/ML IJ SOLN: 0.2500 mg | INTRAMUSCULAR | Status: DC | PRN

## 2022-11-06 MED ORDER — SODIUM CHLORIDE 0.9 % IV SOLN: INTRAVENOUS | Status: DC | PRN

## 2022-11-06 MED ORDER — SODIUM ZIRCONIUM CYCLOSILICATE 10 G PO PACK
10.0000 g | PACK | Freq: Two times a day (BID) | ORAL | Status: AC
  Administered 2022-11-06 (×2): 10 g via ORAL
  Filled 2022-11-06 (×2): qty 1

## 2022-11-06 MED ORDER — HEPARIN SODIUM (PORCINE) 5000 UNIT/ML IJ SOLN
5000.0000 [IU] | Freq: Three times a day (TID) | INTRAMUSCULAR | Status: DC
  Administered 2022-11-06 – 2022-11-07 (×3): 5000 [IU] via SUBCUTANEOUS
  Filled 2022-11-06 (×3): qty 1

## 2022-11-06 MED ORDER — EPHEDRINE SULFATE (PRESSORS) 50 MG/ML IJ SOLN
INTRAMUSCULAR | Status: DC | PRN
  Administered 2022-11-06: 5 mg via INTRAVENOUS

## 2022-11-06 MED ORDER — OXYCODONE HCL 5 MG PO TABS: 5.0000 mg | ORAL_TABLET | Freq: Once | ORAL | Status: DC | PRN

## 2022-11-06 MED ORDER — FUROSEMIDE 10 MG/ML IJ SOLN
80.0000 mg | Freq: Every day | INTRAMUSCULAR | Status: DC
  Administered 2022-11-06 – 2022-11-07 (×2): 80 mg via INTRAVENOUS
  Filled 2022-11-06 (×2): qty 8

## 2022-11-06 MED ORDER — PHENYLEPHRINE HCL (PRESSORS) 10 MG/ML IV SOLN
INTRAVENOUS | Status: DC | PRN
  Administered 2022-11-06 (×2): 160 ug via INTRAVENOUS

## 2022-11-06 SURGICAL SUPPLY — 25 items

## 2022-11-06 NOTE — Transfer of Care (Signed)
Immediate Anesthesia Transfer of Care Note  Patient: Carol [name]  Procedure(s) Performed: ESOPHAGOGASTRODUODENOSCOPY (EGD) WITH PROPOFOL COLONOSCOPY WITH PROPOFOL BALLOON DILATION  Patient Location: PACU  Anesthesia Type:MAC  Level of Consciousness: awake, alert , patient cooperative, and responds to stimulation  Airway & Oxygen Therapy: Patient Spontanous Breathing and Patient connected to nasal cannula oxygen  Post-op Assessment: Report given to RN and Post -op Vital signs reviewed and stable  Post vital signs: Reviewed and stable  Last Vitals:  Vitals Value Taken Time  BP 144/80 11/06/22 0823  Temp    Pulse 101 11/06/22 0824  Resp 18 11/06/22 0824  SpO2 100 % 11/06/22 0824  Vitals shown include unvalidated device data.  Last Pain:  Vitals:   11/06/22 0710  TempSrc: Temporal  PainSc: 0-No pain      Patients Stated Pain Goal: 5 ([id number])  Complications: No notable events documented.

## 2022-11-06 NOTE — Plan of Care (Signed)

## 2022-11-06 NOTE — Anesthesia Postprocedure Evaluation (Signed)
Anesthesia Post Note  Patient: Carol [name]  Procedure(s) Performed: ESOPHAGOGASTRODUODENOSCOPY (EGD) WITH PROPOFOL COLONOSCOPY WITH PROPOFOL BALLOON DILATION     Patient location during evaluation: PACU Anesthesia Type: MAC Level of consciousness: awake and alert Pain management: pain level controlled Vital Signs Assessment: post-procedure vital signs reviewed and stable Respiratory status: spontaneous breathing, nonlabored ventilation and respiratory function stable Cardiovascular status: blood pressure returned to baseline and stable Postop Assessment: no apparent nausea or vomiting Anesthetic complications: no   No notable events documented.  Last Vitals:  Vitals:   11/06/22 0845 11/06/22 0901  BP: (!) 188/96 (!) 192/88  Pulse: 95 96  Resp: 12 17  Temp:  36.8 C  SpO2: 100% 99%    Last Pain:  Vitals:   11/06/22 0901  TempSrc: Oral  PainSc:                  Lynda Rainwater

## 2022-11-06 NOTE — Interval H&P Note (Signed)
History and Physical Interval Note:  11/06/2022 7:39 AM  Carol [name]  has presented today for surgery, with the diagnosis of Anemia; dysphagia.  The various methods of treatment have been discussed with the patient and family. After consideration of risks, benefits and other options for treatment, the patient has consented to  Procedure(s): ESOPHAGOGASTRODUODENOSCOPY (EGD) WITH PROPOFOL (N/A) COLONOSCOPY WITH PROPOFOL (N/A) as a surgical intervention.  The patient's history has been reviewed, patient examined, no change in status, stable for surgery.  I have reviewed the patient's chart and labs.  Questions were answered to the patient's satisfaction.     [name]

## 2022-11-06 NOTE — Progress Notes (Signed)
PROGRESS NOTE        PATIENT DETAILS Name: Carol [name] Age: 62 y.o. Sex: female Date of Birth: 19-Apr-1961 Admit Date: 11/01/2022 Admitting Physician Evalee Mutton Kristeen Mans, MD [id number], [name], NP  Brief Summary: Patient is a 62 y.o.  female chronic HFpEF, CAD-being medically managed, DM-2, HTN, stage IV CKD, crack cocaine use-presented with acute metabolic encephalopathy in the setting of hypoglycemia.  Patient also acknowledged solid food dysphagia for close to a year.  She was subsequently admitted to the hospitalist service.  Significant events: 3/14>> admit to [facility]  Significant studies: 3/15>> barium esophagogram: Possible stricture at the GE junction.  Esophageal dysmotility 3/17>> renal ultrasound: No cysts  Significant microbiology data: None  Procedures: 3/19>> EGD: No stricture seen-esophagus empirically dilated 3/19>> colonoscopy: No major abnormality seen  Consults: GI Psych  Subjective: Worsening lower extremity edema.  Otherwise no complaints.  Wants to go home.  Objective: Vitals: Blood pressure (!) 192/88, pulse 96, temperature 98.2 F (36.8 C), temperature source Oral, resp. rate 17, height 5\' 4"  (1.626 m), weight 97.4 kg, SpO2 99 %.   Exam: Gen Exam:Alert awake-not in any distress HEENT:atraumatic, normocephalic Chest: B/L clear to auscultation anteriorly CVS:S1S2 regular Abdomen:soft non tender, non distended Extremities:++ edema Neurology: Non focal Skin: no rash  Pertinent Labs/Radiology:    Latest Ref Rng & Units 11/02/2022    8:06 AM 11/01/2022    4:44 AM 07/22/2022    5:04 AM  CBC  WBC 4.0 - 10.5 K/uL 4.6  4.8  4.7   Hemoglobin 12.0 - 15.0 g/dL 9.9  9.6  10.8   Hematocrit 36.0 - 46.0 % 30.9  29.0  31.4   Platelets 150 - 400 K/uL 211  186  222     Lab Results  Component Value Date   NA 137 11/06/2022   K 5.2 (H) 11/06/2022   CL 109 11/06/2022   CO2 19 (L) 11/06/2022      Assessment/Plan: DM-2  with hypoglycemia (A1C6.2 on 3/14) Recurrent issue-similar hospitalization December 2023-has had similar hospitalizations in the past as well Home regimen includes Lantus 5 units daily (but she is taking 12 units daily), metformin and glipizide.  Given severity of hypoglycemia-suspect can stop glipizide and Lantus-and maintain on Tradjenta on discharge.  Metformin contraindicated due to renal function.    CBGs relatively stable on resistant SSI  Needs extensive diabetic education-Per outpatient cardiology note-patient has limited reading ability-finish 12 grade but did not pass-she apparently was in special needs classes.  This may be the reason why she has recurrent hypoglycemia.   Recent Labs    11/05/22 2012 11/06/22 0718 11/06/22 0826  GLUCAP 141* 106* 113*     Dysphagia Mostly to solids-ongoing x 1 year Barium esophagogram suggestive of lower esophageal sphincter--however no major finding seen on EGD on 3/19. This could have been from motility disorder-will maintain on PPI. Will resume soft diet and see how she does.  CAD with history of non-STEMI 4/23 (LHC with occluded small sub-branch of PDA-treated medically) No anginal symptoms currently Plavix initially held-now will be resumed as patient is s/p EGD. Continue aspirin/statin Suspect not on beta-blocker given history of active cocaine use  Acute on chronic HFpEF Continues to have lower extremity edema Significant weight gain over the past several days Since creatinine has plateaued-no further GI procedures planned-will start diuresis with IV Lasix. Follow electrolyte/weights  Prolonged QTc interval Telemetry monitoring Keep K> 4, Mg> 2 QTc improved on 3/16-with borderline QTc prolongation Avoid QTc prolonging agents  Hypomagnesemia Repleted.  Hyperkalemia Mild In the setting of worsening renal function Lokelma x 2 doses today Repeat electrolytes tomorrow  HTN BP elevated today-suspect has not received  antihypertensives this morning as she just came back from EGD/colonoscopy Resume current dosing of amlodipine/hydralazine/Imdur-and follow  AKI on CKD 4 Suspect AKI hemodynamically mediated-blood pressure was somewhat soft several days back. Creatinine now has plateaued and downtrending Volume overloaded-will start IV Lasix. Renal ultrasound without any hydronephrosis Avoid nephrotoxic agent Follow electrolytes.  History of liver sarcoidosis Stable LFTs Resume outpatient follow-up with GI  Anxiety/depression Stable Given prolonged QTc Psych consulted-Zoloft/Seroquel discontinued-now on Remeron  Obesity: Estimated body mass index is 36.86 kg/m as calculated from the following:   Height as of this encounter: 5\' 4"  (1.626 m).   Weight as of this encounter: 97.4 kg.   Code status:   Code Status: Full Code   DVT Prophylaxis: heparin injection 5,000 Units Start: 11/06/22 1400 Place TED hose Start: 11/01/22 1007    Family Communication: Significant other at bedside   Disposition Plan: Status is: Observation The patient will require care spanning > 2 midnights and should be moved to inpatient because: Severity of illness   Planned Discharge Destination:Home probably in the next few days.   Diet: Diet Order             Diet heart healthy/carb modified Fluid consistency: Thin; Fluid restriction: 1500 mL Fluid  Diet effective now                     Antimicrobial agents: Anti-infectives (From admission, onward)    None        MEDICATIONS: Scheduled Meds:  amLODipine  5 mg Oral Daily   aspirin EC  81 mg Oral Daily   atorvastatin  80 mg Oral Daily   clopidogrel  75 mg Oral Daily   fluticasone furoate-vilanterol  1 puff Inhalation Daily   furosemide  80 mg Intravenous Daily   gabapentin  100 mg Oral QHS   heparin injection (subcutaneous)  5,000 Units Subcutaneous Q8H   hydrALAZINE  25 mg Oral Q8H   hydrOXYzine  50 mg Oral QHS   insulin aspart  0-20  Units Subcutaneous TID WC   isosorbide mononitrate  15 mg Oral Daily   lidocaine  1 patch Transdermal Q24H   linagliptin  5 mg Oral Daily   mirtazapine  7.5 mg Oral QHS   pantoprazole (PROTONIX) IV  40 mg Intravenous Daily   sodium chloride flush  3 mL Intravenous Q12H   sodium zirconium cyclosilicate  10 g Oral BID   Continuous Infusions:  sodium chloride     PRN Meds:.sodium chloride, acetaminophen **OR** acetaminophen, albuterol, docusate sodium, hydrALAZINE, melatonin, sodium chloride flush   I have personally reviewed following labs and imaging studies  LABORATORY DATA: CBC: Recent Labs  Lab 11/01/22 0444 11/02/22 0806  WBC 4.8 4.6  HGB 9.6* 9.9*  HCT 29.0* 30.9*  MCV 90.3 90.1  PLT 186 [id number]    Basic Metabolic Panel: Recent Labs  Lab 11/01/22 1214 11/02/22 0806 11/03/22 0348 11/04/22 0425 11/05/22 0312 11/06/22 0315  NA  --  132* 132* 133* 132* 137  K  --  4.0 5.2* 5.1 5.7* 5.2*  CL  --  106 105 108 106 109  CO2  --  17* 18* 18* 19* 19*  GLUCOSE  --  92  195* 133* 134* 133*  BUN  --  20 27* 36* 41* 39*  CREATININE  --  2.76* 3.38* 3.74* 3.67* 3.56*  CALCIUM  --  8.3* 8.3* 8.2* 8.2* 8.6*  MG 1.5* 1.8 2.5* 2.3  --   --     GFR: Estimated Creatinine Clearance: 19 mL/min (A) (by C-G formula based on SCr of 3.56 mg/dL (H)).  Liver Function Tests: Recent Labs  Lab 11/01/22 1214  AST 22  ALT 10  ALKPHOS 124  BILITOT 0.8  PROT 6.2*  ALBUMIN 2.0*   No results for input(s): "LIPASE", "AMYLASE" in the last 168 hours. No results for input(s): "AMMONIA" in the last 168 hours.  Coagulation Profile: No results for input(s): "INR", "PROTIME" in the last 168 hours.  Cardiac Enzymes: No results for input(s): "CKTOTAL", "CKMB", "CKMBINDEX", "TROPONINI" in the last 168 hours.  BNP (last 3 results) No results for input(s): "PROBNP" in the last 8760 hours.  Lipid Profile: No results for input(s): "CHOL", "HDL", "LDLCALC", "TRIG", "CHOLHDL", "LDLDIRECT" in the  last 72 hours.  Thyroid Function Tests: No results for input(s): "TSH", "T4TOTAL", "FREET4", "T3FREE", "THYROIDAB" in the last 72 hours.   Anemia Panel: No results for input(s): "VITAMINB12", "FOLATE", "FERRITIN", "TIBC", "IRON", "RETICCTPCT" in the last 72 hours.   Urine analysis:    Component Value Date/Time   COLORURINE YELLOW 11/01/2022 0345   APPEARANCEUR HAZY (A) 11/01/2022 0345   LABSPEC 1.004 (L) 11/01/2022 0345   PHURINE 7.0 11/01/2022 0345   GLUCOSEU NEGATIVE 11/01/2022 0345   HGBUR NEGATIVE 11/01/2022 0345   BILIRUBINUR NEGATIVE 11/01/2022 0345   KETONESUR NEGATIVE 11/01/2022 0345   PROTEINUR >=300 (A) 11/01/2022 0345   UROBILINOGEN 0.2 01/29/2014 2116   NITRITE NEGATIVE 11/01/2022 0345   LEUKOCYTESUR MODERATE (A) 11/01/2022 0345    Sepsis Labs: Lactic Acid, Venous    Component Value Date/Time   LATICACIDVEN 1.2 05/09/2022 1154    MICROBIOLOGY: No results found for this or any previous visit (from the past 240 hour(s)).  RADIOLOGY STUDIES/RESULTS: US RENAL  Result Date: 11/04/2022 CLINICAL DATA:  [id number] AKI (acute kidney injury) ([place]) [id number] EXAM: RENAL / URINARY TRACT ULTRASOUND COMPLETE COMPARISON:  Ultrasound 01/24/2022 FINDINGS: Right Kidney: Renal measurements: 9.6 x 4.7 x 4.0 cm = volume: 96 mL. No hydronephrosis. Increased renal cortical echogenicity. Left Kidney: Renal measurements: 11.3 x 5.3 x 4.3 cm = volume: 136 mL. No hydronephrosis. Increased renal cortical echogenicity. Bladder: Appears normal for degree of bladder distention. Other: None. IMPRESSION: No hydronephrosis. Increased renal cortical echogenicity bilaterally, as can be seen in medical renal disease. Electronically Signed   By: Maurine Simmering M.D.   On: 11/04/2022 13:41     LOS: 4 days   Oren Binet, MD  [facility]    To contact the attending provider between 7A-7P or the covering provider during after hours 7P-7A, please log into the web site [email or web address] and access using  universal [place] password for that web site. If you do not have the password, please call the hospital operator.  11/06/2022, 9:51 AM

## 2022-11-06 NOTE — Op Note (Signed)
[facility] Patient Name: Carol [name] Procedure Date : 11/06/2022 MRN: [id number] Attending MD: Mauri Pole , MD, [id number] Date of Birth: 21-Nov-1960 CSN: [id number] Age: 62 Admit Type: Inpatient Procedure:                Upper GI endoscopy Indications:              Suspected upper gastrointestinal bleeding in                            patient with chronic blood loss, Gastrointestinal                            bleeding of unknown origin, Dysphagia Providers:                Mauri Pole, MD, Helane Rima, RN,                            Luan Moore, Technician, Tressia Miners, CRNA Referring MD:              Medicines:                Monitored Anesthesia Care Complications:            No immediate complications. Estimated Blood Loss:     Estimated blood loss was minimal. Procedure:                Pre-Anesthesia Assessment:                           - Prior to the procedure, a History and Physical                            was performed, and patient medications and                            allergies were reviewed. The patient's tolerance of                            previous anesthesia was also reviewed. The risks                            and benefits of the procedure and the sedation                            options and risks were discussed with the patient.                            All questions were answered, and informed consent                            was obtained. Prior Anticoagulants: The patient                            last took Plavix (clopidogrel) 5 days prior to the  procedure. ASA Grade Assessment: III - A patient                            with severe systemic disease. After reviewing the                            risks and benefits, the patient was deemed in                            satisfactory condition to undergo the procedure.                           After obtaining informed  consent, the endoscope was                            passed under direct vision. Throughout the                            procedure, the patient's blood pressure, pulse, and                            oxygen saturations were monitored continuously. The                            [id number]) Olympus endoscope was introduced                            through the mouth, and advanced to the second part                            of duodenum. The upper GI endoscopy was                            accomplished without difficulty. The patient                            tolerated the procedure well. Scope In: Scope Out: Findings:      Noted moderate amount mucus and old heme in the posterior pharynx,       suctioned through scope successfully, no obvious lesions in the       oropharynx      No endoscopic abnormality was evident in the esophagus to explain the       patient's complaint of dysphagia. It was decided, however, to proceed       with dilation of the lower third of the esophagus and at the       gastroesophageal junction. A TTS dilator was passed through the scope.       Dilation with an 18-19-20 mm x 8 cm CRE balloon dilator was performed to       20 mm. The dilation site was examined following endoscope reinsertion       and showed mild mucosal disruption.      The stomach was normal.      The cardia and gastric fundus were normal on retroflexion.      The examined duodenum was normal. Impression:               -  No endoscopic esophageal abnormality to explain                            patient's dysphagia. Esophagus dilated. Dilated.                           - Normal stomach.                           - Normal examined duodenum.                           - No specimens collected. Recommendation:           - Patient has a contact number available for                            emergencies. The signs and symptoms of potential                            delayed  complications were discussed with the                            patient. Return to normal activities tomorrow.                            Written discharge instructions were provided to the                            patient.                           - Resume previous diet.                           - Continue present medications.                           - Follow an antireflux regimen.                           - Use Protonix (pantoprazole) 40 mg PO daily.                           - Resume Plavix (clopidogrel) at prior dose in 2                            days. Refer to managing physician for further                            adjustment of therapy.                           - Follow up with outpatient GI                           - GI will sign off, available if have any  questions Procedure Code(s):        --- Professional ---                           [phone], Esophagogastroduodenoscopy, flexible,                            transoral; with transendoscopic balloon dilation of                            esophagus (less than 30 mm diameter) Diagnosis Code(s):        --- Professional ---                           R13.10, Dysphagia, unspecified                           R58, Hemorrhage, not elsewhere classified                           K92.2, Gastrointestinal hemorrhage, unspecified CPT copyright 2022 American Medical Association. All rights reserved. The codes documented in this report are preliminary and upon coder review may  be revised to meet current compliance requirements. Mauri Pole, MD 11/06/2022 9:05:20 AM This report has been signed electronically. Number of Addenda: 0

## 2022-11-06 NOTE — Progress Notes (Signed)
Speech Language Pathology Treatment: Dysphagia  Patient Details Name: Carol [name] MRN: [id number] DOB: 1961/03/11 Today's Date: 11/06/2022 Time: 1020-1040 SLP Time Calculation (min) (ACUTE ONLY): 20 min  Assessment / Plan / Recommendation Clinical Impression  Order received for reassessment of patient's swallow function following EGD completion today (3/19). Per EGD report, no stricture was seen and no endoscopic abnormality to explain patient's symptoms but decision made to dilate lower 1/3 of esophagus. When SLP arrived into room, patient sitting up in recliner with no complaints. Her breakfast meal try arrived and reported being hungry. Patient told SLP that she has not had any incidents of gasping for air as she had during early part of this admission. She feels that anxiety plays a role in her dysphagia and describes anxiety from eating at gatherings/parties, etc as she will become fearful of choking/coughing. SLP observed patient with PO intake of thin liquids, soft solids (scrambled eggs). She exhibited mildly prolonged mastication but this is likely due to missing dentition. No overt s/s aspiration or penetration, no observed gagging, or post-swallow difficulties and patient not c/o globus sensation or any significant difficulty. At this time, SLP recommending patient continue on current diet and agree with GI recommendations for outpatient GI management. SLP to s/o at this time.    HPI HPI: Carol [name] is a 62 y.o. female who presented to the emergency department for episode of hypoglycemia with BGL 27 on arrival. 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  Recommendations may be updated based on patient status, additional functional criteria and insurance authorization.    Recommendations  Diet recommendations: Regular;Thin liquid Liquids provided via: Cup;Straw Medication Administration: Whole meds with liquid Supervision: Patient able to self feed Compensations: Slow rate;Small sips/bites;Follow solids with liquid Postural Changes and/or Swallow Maneuvers: Seated upright 90 degrees                Oral Care Recommendations: Oral care BID Follow Up Recommendations: No SLP follow up Assistance recommended at discharge: None SLP Visit Diagnosis: Dysphagia, pharyngoesophageal phase (R13.14) Plan: All goals met           Sonia Baller, MA, CCC-SLP Speech Therapy

## 2022-11-06 NOTE — Op Note (Signed)
[facility] Patient Name: Carol [name] Procedure Date : 11/06/2022 MRN: [id number] Attending MD: Mauri Pole , MD, [id number] Date of Birth: 11-Feb-1961 CSN: [id number] Age: 62 Admit Type: Inpatient Procedure:                Colonoscopy Indications:              Evaluation of unexplained GI bleeding presenting                            with fecal occult blood, Iron deficiency anemia                            secondary to chronic blood loss Providers:                Mauri Pole, MD, Helane Rima, RN,                            Luan Moore, Technician, Tressia Miners, CRNA Referring MD:              Medicines:                Monitored Anesthesia Care Complications:            No immediate complications. Estimated Blood Loss:     Estimated blood loss was minimal. Procedure:                Pre-Anesthesia Assessment:                           - Prior to the procedure, a History and Physical                            was performed, and patient medications and                            allergies were reviewed. The patient's tolerance of                            previous anesthesia was also reviewed. The risks                            and benefits of the procedure and the sedation                            options and risks were discussed with the patient.                            All questions were answered, and informed consent                            was obtained. Prior Anticoagulants: The patient                            last took Plavix (clopidogrel) 5 days prior to the  procedure. ASA Grade Assessment: III - A patient                            with severe systemic disease. After reviewing the                            risks and benefits, the patient was deemed in                            satisfactory condition to undergo the procedure.                           After obtaining informed consent, the  colonoscope                            was passed under direct vision. Throughout the                            procedure, the patient's blood pressure, pulse, and                            oxygen saturations were monitored continuously. The                            [id number]) Olympus colonoscope was                            introduced through the anus and advanced to the the                            cecum, identified by appendiceal orifice and                            ileocecal valve. The colonoscopy was somewhat                            difficult due to inadequate bowel prep. Successful                            completion of the procedure was aided by lavage.                            The patient tolerated the procedure well. The                            quality of the bowel preparation was fair. The                            ileocecal valve, appendiceal orifice, and rectum                            were photographed. Scope In: 8:03:48 AM Scope Out: 8:16:07 AM Scope Withdrawal Time: 0 hours 6 minutes 42 seconds  Total Procedure Duration: 0  hours 12 minutes 19 seconds  Findings:      The perianal and digital rectal examinations were normal.      A moderate amount of stool was found in the descending colon, in the       ascending colon and in the cecum, precluding visualization.      The exam was otherwise normal throughout the examined and visualized       areas of colon. Impression:               - Preparation of the colon was fair.                           - Stool in the descending colon, in the ascending                            colon and in the cecum.                           - No specimens collected. Recommendation:           - Resume previous diet.                           - Continue present medications.                           - Repeat colonoscopy for colorectal cancer                            screening as this exam was inadequate because  the                            bowel preparation was suboptimal.                           - See the other procedure note for documentation of                            additional recommendations. Procedure Code(s):        --- Professional ---                           [phone], Colonoscopy, flexible; diagnostic, including                            collection of specimen(s) by brushing or washing,                            when performed (separate procedure) Diagnosis Code(s):        --- Professional ---                           R19.5, Other fecal abnormalities                           D50.0, Iron deficiency anemia secondary to blood  loss (chronic) CPT copyright 2022 American Medical Association. All rights reserved. The codes documented in this report are preliminary and upon coder review may  be revised to meet current compliance requirements. Mauri Pole, MD 11/06/2022 9:00:58 AM This report has been signed electronically. Number of Addenda: 0

## 2022-11-06 NOTE — Care Management Important Message (Signed)
Important Message  Patient Details  Name: Carol [name] MRN: [id number] Date of Birth: 07/13/1961   Medicare Important Message Given:  Yes     Shama Monfils [address] 11/06/2022, 10:19 AM

## 2022-11-06 NOTE — Anesthesia Preprocedure Evaluation (Signed)
Anesthesia Evaluation  Patient identified by MRN, date of birth, ID band Patient awake    Reviewed: Allergy & Precautions, NPO status , Patient's Chart, lab work & pertinent test results, reviewed documented beta blocker date and time   History of Anesthesia Complications Negative for: history of anesthetic complications  Airway Mallampati: II  TM Distance: >3 FB Neck ROM: Full    Dental  (+) Dental Advisory Given, Chipped   Pulmonary COPD,  COPD inhaler, Current Smoker and Patient abstained from smoking.   breath sounds clear to auscultation       Cardiovascular hypertension, Pt. on medications and Pt. on home beta blockers (-) angina + CAD, + Past MI and +CHF   Rhythm:Regular Rate:Normal  '15 ECHO: EF 65%, valves OK   Neuro/Psych   Anxiety Depression    negative neurological ROS     GI/Hepatic ,GERD  Medicated and Poorly Controlled,,(+) Hepatitis - (elevated LFTs)  Endo/Other  diabetes, Insulin Dependent, Oral Hypoglycemic Agents    Renal/GU Renal InsufficiencyRenal disease (creat 2.73)     Musculoskeletal   Abdominal  (+) + obese  Peds  Hematology  (+) Blood dyscrasia, anemia   Anesthesia Other Findings   Reproductive/Obstetrics                             Anesthesia Physical Anesthesia Plan  ASA: III  Anesthesia Plan: MAC   Post-op Pain Management: Gabapentin PO (pre-op)* and Minimal or no pain anticipated   Induction: Intravenous  PONV Risk Score and Plan: 1 and Ondansetron and Treatment may vary due to age or medical condition  Airway Management Planned: Nasal Cannula  Additional Equipment:   Intra-op Plan:   Post-operative Plan:   Informed Consent: I have reviewed the patients History and Physical, chart, labs and discussed the procedure including the risks, benefits and alternatives for the proposed anesthesia with the patient or authorized representative who has  indicated his/her understanding and acceptance.     Dental advisory given  Plan Discussed with: CRNA and Surgeon  Anesthesia Plan Comments:         Anesthesia Quick Evaluation

## 2022-11-06 NOTE — Interval H&P Note (Signed)
History and Physical Interval Note:  11/06/2022 7:39 AM  Carol [name]  has presented today for surgery, with the diagnosis of Anemia; dysphagia.  The various methods of treatment have been discussed with the patient and family. After consideration of risks, benefits and other options for treatment, the patient has consented to  Procedure(s): ESOPHAGOGASTRODUODENOSCOPY (EGD) WITH PROPOFOL (N/A) COLONOSCOPY WITH PROPOFOL (N/A) as a surgical intervention.  The patient's history has been reviewed, patient examined, no change in status, stable for surgery.  I have reviewed the patient's chart and labs.  Questions were answered to the patient's satisfaction.     Rand Boller

## 2022-11-07 ENCOUNTER — Encounter (HOSPITAL_COMMUNITY): Payer: Self-pay | Admitting: Gastroenterology

## 2022-11-07 ENCOUNTER — Other Ambulatory Visit (HOSPITAL_COMMUNITY): Payer: Self-pay

## 2022-11-07 DIAGNOSIS — I10 Essential (primary) hypertension: Secondary | ICD-10-CM

## 2022-11-07 DIAGNOSIS — R1310 Dysphagia, unspecified: Secondary | ICD-10-CM | POA: Diagnosis not present

## 2022-11-07 DIAGNOSIS — D500 Iron deficiency anemia secondary to blood loss (chronic): Secondary | ICD-10-CM

## 2022-11-07 DIAGNOSIS — E11649 Type 2 diabetes mellitus with hypoglycemia without coma: Secondary | ICD-10-CM | POA: Diagnosis not present

## 2022-11-07 DIAGNOSIS — R9431 Abnormal electrocardiogram [ECG] [EKG]: Secondary | ICD-10-CM | POA: Diagnosis not present

## 2022-11-07 DIAGNOSIS — F1410 Cocaine abuse, uncomplicated: Secondary | ICD-10-CM

## 2022-11-07 LAB — BASIC METABOLIC PANEL
Anion gap: 8 (ref 5–15)
BUN: 36 mg/dL — ABNORMAL HIGH (ref 6–20)
CO2: 19 mmol/L — ABNORMAL LOW (ref 22–32)
Calcium: 8.1 mg/dL — ABNORMAL LOW (ref 8.9–10.3)
Chloride: 107 mmol/L (ref 98–111)
Creatinine, Ser: 3.25 mg/dL — ABNORMAL HIGH (ref 0.44–1.00)
GFR, Estimated: 16 mL/min — ABNORMAL LOW (ref >60)
Glucose, Bld: 94 mg/dL (ref 70–99)
Potassium: 4.1 mmol/L (ref 3.5–5.1)
Sodium: 134 mmol/L — ABNORMAL LOW (ref 135–145)

## 2022-11-07 LAB — GLUCOSE, CAPILLARY
Glucose-Capillary: 93 mg/dL (ref 70–99)
Glucose-Capillary: 214 mg/dL — ABNORMAL HIGH (ref 70–99)

## 2022-11-07 MED ORDER — TORSEMIDE 20 MG PO TABS
40.0000 mg | ORAL_TABLET | Freq: Every day | ORAL | 3 refills | Status: DC
  Filled 2022-11-07: qty 90, 45d supply, fill #0

## 2022-11-07 MED ORDER — MELATONIN 5 MG PO TABS
5.0000 mg | ORAL_TABLET | Freq: Every evening | ORAL | 1 refills | Status: DC | PRN
  Filled 2022-11-07: qty 30, 30d supply, fill #0

## 2022-11-07 MED ORDER — LINAGLIPTIN 5 MG PO TABS
5.0000 mg | ORAL_TABLET | Freq: Every day | ORAL | 1 refills | Status: DC
  Filled 2022-11-07: qty 30, 30d supply, fill #0

## 2022-11-07 MED ORDER — MIRTAZAPINE 7.5 MG PO TABS
7.5000 mg | ORAL_TABLET | Freq: Every day | ORAL | 3 refills | Status: DC
  Filled 2022-11-07: qty 30, 30d supply, fill #0

## 2022-11-07 NOTE — Progress Notes (Signed)
Patient had an order to have her home medicine to be reviewed by RN before leaving.RN asked the patient and her spouse to bring the home medicine.But they refused to bring telling he doesnot want to go home to bring the medicine.MD aware.

## 2022-11-07 NOTE — Plan of Care (Signed)

## 2022-11-07 NOTE — Discharge Summary (Addendum)
PATIENT DETAILS Name: Carol [name] Age: 62 y.o. Sex: female Date of Birth: 1961/01/14 MRN: [id number]. Admitting Physician: Jonetta Osgood, MD [id number], [name], NP  Admit Date: 11/01/2022 Discharge date: 11/07/2022  Recommendations for Outpatient Follow-up:  Follow up with PCP in 1-2 weeks Please obtain CMP/CBC in one week Ensure follow-up with cardiology, endocrinology, psychiatry  Admitted From:  Home  Disposition: Home health   Discharge Condition: fair  CODE STATUS:   Code Status: Full Code   Diet recommendation:  Diet Order             Diet - low sodium heart healthy           Diet Carb Modified           Diet heart healthy/carb modified Fluid consistency: Thin; Fluid restriction: 1500 mL Fluid  Diet effective now                    Brief Summary: Patient is a 62 y.o.  female chronic HFpEF, CAD-being medically managed, DM-2, HTN, stage IV CKD, crack cocaine use-presented with acute metabolic encephalopathy in the setting of hypoglycemia.  Patient also acknowledged solid food dysphagia for close to a year.  She was subsequently admitted to the hospitalist service.   Significant events: 3/14>> admit to [facility]   Significant studies: 3/15>> barium esophagogram: Possible stricture at the GE junction.  Esophageal dysmotility 3/17>> renal ultrasound: No cysts   Significant microbiology data: None   Procedures: 3/19>> EGD: No stricture seen-esophagus empirically dilated 3/19>> colonoscopy: No major abnormality seen   Consults: GI Psych  Brief Hospital Course: DM-2 with hypoglycemia (A1C6.2 on 3/14) Recurrent issue-similar hospitalization December 2023-has had similar hospitalizations in the past as well Home regimen includes Lantus 5 units daily (but she is taking 12 units daily), metformin and glipizide.  Given severity of hypoglycemia-suspect can stop glipizide and Lantus-and maintain on Tradjenta on discharge.  Metformin  contraindicated due to renal function.  She is not a candidate for aggressive glycemic control and would allow some amount of permissive hyperglycemia.  Extensive diabetic education provided-per outpatient notes-patient has extremely limited reading ability-was in a special needs class-this may be the reason why she has had recurrent hypoglycemia requiring hospitalization.  Extensive discussion with significant other today-I have asked him to see if he can get all his home medications here to the hospital so that we can separate medications that have been discontinued.  Discussed with transition of care team-Home health RN ordered to see if we can help with medications.  Dysphagia Mostly to solids-ongoing x 1 year Barium esophagogram suggestive of lower esophageal sphincter--however no major finding seen on EGD on 3/19.  Esophagus empirically dilated by GI during EGD. This could have been from motility disorder-will maintain on PPI. She is now able to tolerate solid food without any major issues.   CAD with history of non-STEMI 4/23 (LHC with occluded small sub-branch of PDA-treated medically) No anginal symptoms currently Plavix initially held-now will be resumed as patient is s/p EGD. Continue aspirin/statin/nebivolol   Acute on chronic HFpEF Still with significant lower extremity edema-but lungs clear-not short of breath-received IV Lasix yesterday [place] on leaving the hospital today-will give another dose of IV Lasix prior to d/c Increase Demadex to 40 mg Follow-up with cardiology-appointment made on 4/1   Prolonged QTc interval Telemetry monitoring Keep K> 4, Mg> 2 QTc improved on 3/16-with borderline QTc prolongation Avoid QTc prolonging agents   Hypomagnesemia Repleted.   Hyperkalemia Resolved after Lokelma/IV  Lasix.   HTN BP initial he was soft-medications were adjusted However now creeping up-will resume her usual regimen of amlodipine/hydralazine/Imdur/nebivolol on  discharge.     AKI on CKD 4 Suspect AKI hemodynamically mediated-blood pressure was somewhat soft several days back. Creatinine plateaued and has started downtrending Grossly volume overloaded still but better than yesterday-will be on diuretics on discharge-see above. Renal ultrasound without any hydronephrosis  Cocaine use Counseled extensively   History of liver sarcoidosis Stable LFTs Resume outpatient follow-up with GI  History of thyroid nodule Has previously scheduled appointment with endocrinology on 5/25 for further workup   Anxiety/depression Stable Given prolonged QTc Psych consulted-Zoloft/Seroquel discontinued-now on Remeron Case discussed with TOC team-they will try to get her an outpatient appointment at [facility] behavioral health urgent care.   Obesity: Estimated body mass index is 36.86 kg/m as calculated from the following:   Height as of this encounter: 5\' 4"  (1.626 m).   Weight as of this encounter: 97.4 kg.    Discharge Diagnoses:  Principal Problem:   Diabetic hypoglycemia ([place]) Active Problems:   Dysphagia   borderline Prolonged QT interval   CKD (chronic kidney disease) stage 4, GFR 15-29 ml/min (HCC)   Cocaine use disorder, mild, abuse (HCC)   Essential hypertension   CAD in native artery   Anxiety with depression   Chronic diastolic CHF (congestive heart failure) (HCC)   Hypoglycemia   MDD (major depressive disorder), recurrent episode, moderate (HCC)   Abnormal barium swallow   Esophageal stricture   Esophageal dysmotility   Antiplatelet or antithrombotic long-term use   Colon cancer screening   Heme positive stool   Anemia due to chronic blood loss   Discharge Instructions:  Activity:  As tolerated  Discharge Instructions     (HEART FAILURE PATIENTS) Call MD:  Anytime you have any of the following symptoms: 1) 3 pound weight gain in 24 hours or 5 pounds in 1 week 2) shortness of breath, with or without a dry hacking cough  3) swelling in the hands, feet or stomach 4) if you have to sleep on extra pillows at night in order to breathe.   Complete by: As directed    Call MD for:  difficulty breathing, headache or visual disturbances   Complete by: As directed    Diet - low sodium heart healthy   Complete by: As directed    Diet Carb Modified   Complete by: As directed    Discharge instructions   Complete by: As directed    Follow with Primary MD  Simona Huh, NP in 1-2 weeks  Follow-up with CHF clinic-4/1 at 2 PM  Follow-up with outpatient psychiatry  Follow-up with endocrinologist-5/24 at 9:50 AM  Avoid cocaine use  Stop insulin-do not take metformin and glipizide-as you can have recurrent hypoglycemia.  Stop Seroquel/Zoloft-you have been started on Remeron  Please get a complete blood count and chemistry panel checked by your Primary MD at your next visit, and again as instructed by your Primary MD.  Get Medicines reviewed and adjusted: Please take all your medications with you for your next visit with your Primary MD  Laboratory/radiological data: Please request your Primary MD to go over all hospital tests and procedure/radiological results at the follow up, please ask your Primary MD to get all Hospital records sent to his/her office.  In some cases, they will be blood work, cultures and biopsy results pending at the time of your discharge. Please request that your primary care M.D. follows  up on these results.  Also Note the following: If you experience worsening of your admission symptoms, develop shortness of breath, life threatening emergency, suicidal or homicidal thoughts you must seek medical attention immediately by calling 911 or calling your MD immediately  if symptoms less severe.  You must read complete instructions/literature along with all the possible adverse reactions/side effects for all the Medicines you take and that have been prescribed to you. Take any new Medicines after  you have completely understood and accpet all the possible adverse reactions/side effects.   Do not drive when taking Pain medications or sleeping medications (Benzodaizepines)  Do not take more than prescribed Pain, Sleep and Anxiety Medications. It is not advisable to combine anxiety,sleep and pain medications without talking with your primary care practitioner  Special Instructions: If you have smoked or chewed Tobacco  in the last 2 yrs please stop smoking, stop any regular Alcohol  and or any Recreational drug use.  Wear Seat belts while driving.  Please note: You were cared for by a hospitalist during your hospital stay. Once you are discharged, your primary care physician will handle any further medical issues. Please note that NO REFILLS for any discharge medications will be authorized once you are discharged, as it is imperative that you return to your primary care physician (or establish a relationship with a primary care physician if you do not have one) for your post hospital discharge needs so that they can reassess your need for medications and monitor your lab values.   Increase activity slowly   Complete by: As directed       Allergies as of 11/07/2022       Reactions   Penicillins Shortness Of Breath, Other (See Comments)   Caused yeast infection Has patient had a PCN reaction causing immediate rash, facial/tongue/throat swelling, SOB or lightheadedness with hypotension: Yes Has patient had a PCN reaction causing severe rash involving mucus membranes or skin necrosis: No Has patient had a PCN reaction that required hospitalization pt was in the hospital at the time of the reaction Has patient had a PCN reaction occurring within the last 10 years: No If all of the above answers are "NO", then may proceed with Cephalosp   Ultram [tramadol] Other (See Comments)   Made her tongue raw        Medication List     STOP taking these medications    glimepiride 2 MG  tablet Commonly known as: AMARYL   insulin glargine 100 UNIT/ML Solostar Pen Commonly known as: LANTUS   metFORMIN 500 MG tablet Commonly known as: GLUCOPHAGE   Pen Needles 32G X 4 MM Misc   QUEtiapine 300 MG 24 hr tablet Commonly known as: SEROQUEL XR   sertraline 50 MG tablet Commonly known as: ZOLOFT       TAKE these medications    albuterol 108 (90 Base) MCG/ACT inhaler Commonly known as: VENTOLIN HFA Inhale 2 puffs into the lungs every 4 (four) hours as needed for wheezing or shortness of breath.   amLODipine 10 MG tablet Commonly known as: NORVASC Take 1 tablet (10 mg total) by mouth daily.   Aspirin Low Dose 81 MG tablet Generic drug: aspirin EC Take 1 tablet (81 mg total) by mouth daily. Swallow whole.   atorvastatin 80 MG tablet Commonly known as: LIPITOR Take 1 tablet (80 mg total) by mouth daily.   budesonide-formoterol 160-4.5 MCG/ACT inhaler Commonly known as: SYMBICORT Inhale 2 puffs into the lungs every morning.  cetirizine 10 MG tablet Commonly known as: ZYRTEC Take 10 mg by mouth daily as needed for allergies.   clopidogrel 75 MG tablet Commonly known as: PLAVIX Take 1 tablet (75 mg total) by mouth daily.   ergocalciferol 1.25 MG (50000 UT) capsule Commonly known as: VITAMIN D2 Take 1 capsule (50,000 Units total) by mouth once a week. Wednesday   gabapentin 100 MG capsule Commonly known as: NEURONTIN Take 2 capsules (200 mg total) by mouth 2 (two) times daily. What changed:  how much to take when to take this   hydrALAZINE 50 MG tablet Commonly known as: APRESOLINE Take 1 tablet (50 mg total) by mouth 3 (three) times daily.   hydrOXYzine 50 MG tablet Commonly known as: ATARAX Take 50 mg by mouth at bedtime.   isosorbide mononitrate 30 MG 24 hr tablet Commonly known as: IMDUR Take 1/2 tablet (15 mg total) by mouth daily.   linagliptin 5 MG Tabs tablet Commonly known as: TRADJENTA Take 1 tablet (5 mg total) by mouth  daily. Start taking on: November 08, 2022   melatonin 5 MG Tabs Take 1 tablet (5 mg total) by mouth at bedtime as needed (sleep).   mirtazapine 7.5 MG tablet Commonly known as: REMERON Take 1 tablet (7.5 mg total) by mouth at bedtime.   Nebivolol HCl 20 MG Tabs Take 1 tablet (20 mg total) by mouth every morning.   nitroGLYCERIN 0.4 MG SL tablet Commonly known as: NITROSTAT Place 1 tablet (0.4 mg total) under the tongue every 5 (five) minutes as needed for chest pain.   pantoprazole 40 MG tablet Commonly known as: PROTONIX Take 1 tablet (40 mg total) by mouth daily at 12 noon.   potassium chloride 10 MEQ tablet Commonly known as: KLOR-CON M Take 1 tablet (10 mEq total) by mouth daily.   torsemide 20 MG tablet Commonly known as: DEMADEX Take 2 tablets (40 mg total) by mouth daily. Start taking on: November 08, 2022 What changed: how much to take        Follow-up Information     Simona Huh, NP. Schedule an appointment as soon as possible for a visit in 1 week(s).   Specialty: Nurse Practitioner Contact information: [place] [address] [address] [address] [place] Follow up on 11/19/2022.   Specialty: Cardiology Why: APPT AT 2 PM Contact information: [address] [id number] [place] [phone]        Shamleffer, Melanie Crazier, MD Follow up on 01/11/2023.   Specialties: Endocrinology, Radiology Why: APPT AT 9:50 AM Contact information: [address] [place] [address] [phone]                Allergies  Allergen Reactions   Penicillins Shortness Of Breath and Other (See Comments)    Caused yeast infection Has patient had a PCN reaction causing immediate rash, facial/tongue/throat swelling, SOB or lightheadedness with hypotension: Yes Has patient had a PCN reaction causing severe rash involving mucus membranes or skin necrosis:  No Has patient had a PCN reaction that required hospitalization pt was in the hospital at the time of the reaction Has patient had a PCN reaction occurring within the last 10 years: No If all of the above answers are "NO", then may proceed with Cephalosp   Ultram [Tramadol] Other (See Comments)    Made her tongue raw     Other Procedures/Studies: US  RENAL  Result Date: 11/04/2022 CLINICAL DATA:  [id number] AKI (acute kidney injury) ([place]) [id number] EXAM: RENAL / URINARY TRACT ULTRASOUND COMPLETE COMPARISON:  Ultrasound 01/24/2022 FINDINGS: Right Kidney: Renal measurements: 9.6 x 4.7 x 4.0 cm = volume: 96 mL. No hydronephrosis. Increased renal cortical echogenicity. Left Kidney: Renal measurements: 11.3 x 5.3 x 4.3 cm = volume: 136 mL. No hydronephrosis. Increased renal cortical echogenicity. Bladder: Appears normal for degree of bladder distention. Other: None. IMPRESSION: No hydronephrosis. Increased renal cortical echogenicity bilaterally, as can be seen in medical renal disease. Electronically Signed   By: Maurine Simmering M.D.   On: 11/04/2022 13:41   DG ESOPHAGUS W SINGLE CM (SOL OR THIN BA)  Result Date: 11/02/2022 CLINICAL DATA:  62 year old with history of chronic solid food dysphagia. EXAM: ESOPHAGUS/BARIUM SWALLOW/TABLET STUDY TECHNIQUE: Single contrast examination was performed using thin liquid barium. This exam was performed by Brynda Greathouse PA-C, and was supervised and interpreted by Logan Bores, MD. FLUOROSCOPY: Radiation Exposure Index (as provided by the fluoroscopic device): 16.5 mGy Kerma COMPARISON:  Modified barium swallow 11/01/2022. FINDINGS: The examination was limited due to the patient's clinical status, decreased mobility, and difficulty tolerating the examination. Swallowing: More fully evaluated on yesterday's modified barium swallow. No aspiration was observed today. The patient reported discomfort with swallowing and required multiple swallows per bolus. Pharynx: Prominent  cricopharyngeus. Esophagus: Apparent moderate, smooth narrowing at the gastroesophageal junction. Mildly patulous appearance of the esophagus more proximally. Esophageal motility: Stasis of contrast in the lower esophagus with some to and fro motion and frequent tertiary contractions. Delayed passage of barium through the gastroesophageal junction. Hiatal Hernia: None. Gastroesophageal reflux: None visualized. Ingested 13 mm barium tablet: Not given IMPRESSION: 1. Possible stricture at the gastroesophageal junction with delayed barium passage. 2. Esophageal dysmotility with prominent tertiary contractions. Electronically Signed   By: Logan Bores M.D.   On: 11/02/2022 10:31   DG Swallowing Func-Speech Pathology  Result Date: 11/01/2022 Table formatting from the original result was not included. Modified Barium Swallow Study Patient Details Name: Carol [name] MRN: [id number] Date of Birth: December 10, 1960 Today's Date: 11/01/2022 HPI/PMH: HPI: Carol [name] is a 62 y.o. female who presented to the emergency department for episode of hypoglycemia with BGL 27 on arrival. 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 During oral phase, patient did exhibit anterior to posterior transit delays. Swallow was initiated at level of pyriform sinus with both thin and nectar thick liquids with patient gagging during the swallow and exhibiting belching afterwards. Prominent cricopharyngeal bar observed which did appear to impact bolus transit. No penetration or aspiration observed with thin liquids. With  nectar thick liquid via spoon sip, patient started gagging during the swallow, which led to coughing and penetration of nectar, but no aspiration. SLP then ended the study as patient continued to cough and started gasping for air. She did calm down and although she continued to belch and cough, no regurgitation observed. SLP consulted with MD and both in agreement for GI referral but in the meantime, to initiate a PO diet of clear liquids (thin consistency) and meds whole in puree. SLP will follow briefly for toleration. Factors that may increase risk of adverse event in presence  of aspiration ([place] 2021): No data recorded Recommendations/Plan: Swallowing Evaluation Recommendations Swallowing Evaluation Recommendations Recommendations: PO diet PO Diet Recommendation: Thin liquids (Level 0) Liquid Administration via: Cup; Straw Medication Administration: Whole meds with puree Supervision: Patient able to self-feed Swallowing strategies  : Slow rate; Small bites/sips Postural changes: Position pt fully upright for meals; Stay upright 30-60 min after meals Oral care recommendations: Oral care BID (2x/day) Recommended consults: Consider GI consultation Treatment Plan Treatment Plan Treatment recommendations: Therapy as outlined in treatment plan below Follow-up recommendations: Follow physicians's recommendations for discharge plan and follow up therapies Functional status assessment: Patient has had a recent decline in their functional status and demonstrates the ability to make significant improvements in function in a reasonable and predictable amount of time. Treatment frequency: Min 1x/week Treatment duration: 1 week Interventions: Patient/family education; Diet toleration management by SLP Recommendations Recommendations for follow up therapy are one component of a multi-disciplinary discharge planning process, led by the attending physician.  Recommendations may be updated based on patient status,  additional functional criteria and insurance authorization. Assessment: Orofacial Exam: Orofacial Exam Oral Cavity: Oral Hygiene: WFL Oral Cavity - Dentition: Missing dentition Orofacial Anatomy: WFL Oral Motor/Sensory Function: WFL Anatomy: Anatomy: WFL Thin Liquids: Thin Liquids (Level 0) Thin Liquids : Impaired Bolus delivery method: Cup; Spoon Thin Liquid - Impairment: Oral Impairment; Pharyngeal impairment Lip Closure: No labial escape Tongue control during bolus hold: Cohesive bolus between tongue to palatal seal Bolus transport/lingual motion: Slow tongue motion Oral residue: Complete oral clearance Location of oral residue : N/A Initiation of swallow : Pyriform sinuses Soft palate elevation: No bolus between soft palate (SP)/pharyngeal wall (PW) Laryngeal elevation: Complete superior movement of thyroid cartilage with complete approximation of arytenoids to epiglottic petiole Anterior hyoid excursion: Complete Epiglottic movement: Complete Laryngeal vestibule closure: Complete, no air/contrast in laryngeal vestibule Pharyngeal stripping wave : Present - complete Pharyngeal contraction (A/P view only): N/A Pharyngoesophageal segment opening: Partial distention/partial duration, partial obstruction of flow Tongue base retraction: No contrast between tongue base and posterior pharyngeal wall (PPW) Pharyngeal residue: Complete pharyngeal clearance Location of pharyngeal residue: N/A Penetration/Aspiration Scale (PAS) score: 1.  Material does not enter airway  Mildly Thick Liquids: Mildly thick liquids (Level 2, nectar thick) Mildly thick liquids (Level 2, nectar thick): Impaired Bolus delivery method: Spoon Mildly Thick Liquid - Impairment: Pharyngeal impairment Initiation of swallow : Pyriform sinuses Soft palate elevation: No bolus between soft palate (SP)/pharyngeal wall (PW) Laryngeal elevation: Complete superior movement of thyroid cartilage with complete approximation of arytenoids to epiglottic petiole  Anterior hyoid excursion: Complete Epiglottic movement: Complete Laryngeal vestibule closure: Complete, no air/contrast in laryngeal vestibule Pharyngeal stripping wave : Present - complete Pharyngeal contraction (A/P view only): N/A Pharyngoesophageal segment opening: Partial distention/partial duration, partial obstruction of flow Tongue base retraction: No contrast between tongue base and posterior pharyngeal wall (PPW) Pharyngeal residue: Complete pharyngeal clearance Location of pharyngeal residue: N/A Penetration/Aspiration Scale (PAS) score: 2.  Material enters airway, remains ABOVE vocal cords then ejected out  Moderately Thick Liquids: Moderately thick liquids (Level 3, honey thick) Moderately thick liquids (Level 3, honey thick): Not Tested  Puree: Puree Puree: Not Tested Solid: Solid Solid: Not Tested Pill: Pill Pill: Not Tested Compensatory Strategies: No data recorded  General Information: Caregiver present: No  Diet Prior to this Study: NPO   Temperature : Normal   Respiratory Status: WFL   Supplemental O2: None (Room air)   History of Recent Intubation: No  Behavior/Cognition: Alert; Cooperative Self-Feeding Abilities: Able  to self-feed Baseline vocal quality/speech: Normal No data recorded Volitional Swallow: Able to elicit Exam Limitations: Other (comment) (gagging of PO's) Goal Planning: Prognosis for improved oropharyngeal function: Fair Barriers to Reach Goals: Time post onset; Severity of deficits No data recorded Patient/Family Stated Goal: c/o diffuculty swallowing Consulted and agree with results and recommendations: Patient Pain: Pain Assessment Pain Assessment: No/denies pain Faces Pain Scale: 0 End of Session: Start Time:SLP Start Time (ACUTE ONLY): 1330 Stop Time: SLP Stop Time (ACUTE ONLY): 1350 Time Calculation:SLP Time Calculation (min) (ACUTE ONLY): 20 min Charges: SLP Evaluations $ SLP Speech Visit: 1 Visit SLP Evaluations $BSS Swallow: 1 Procedure $MBS Swallow: 1 Procedure SLP visit  diagnosis: SLP Visit Diagnosis: Dysphagia, pharyngoesophageal phase (R13.14) Past Medical History: Past Medical History: Diagnosis Date  Abnormal liver function tests   Acute kidney injury ([place]) 01/2014  Hospitalized, Volume depletion, nausea and vomiting  Anxiety   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  Chronic back pain   Depression   Diabetes mellitus   GERD (gastroesophageal reflux disease)   Heart murmur   Hypertension   NSTEMI (non-ST elevated myocardial infarction) ([place]) 12/20/2021  Palpitations   Parkinson's disease  Past Surgical History: Past Surgical History: Procedure Laterality Date  CARPAL TUNNEL RELEASE    IR FLUORO GUIDE CV LINE RIGHT  01/26/2022  IR US GUIDE VASC ACCESS RIGHT  01/26/2022  LEFT HEART CATH AND CORONARY ANGIOGRAPHY N/A 12/20/2021  Procedure: LEFT HEART CATH AND CORONARY ANGIOGRAPHY;  Surgeon: Burnell Blanks, MD;  Location: [place] [facility];  Service: Cardiovascular;  Laterality: N/A;  OPEN REDUCTION INTERNAL FIXATION (ORIF) FINGER WITH RADIAL BONE GRAFT Right 09/12/2015  Procedure: RIGHT MIDDLE FINGER CLOSED REDUCTION AND PINNING;  Surgeon: Iran Planas, MD;  Location: [facility];  Service: Orthopedics;  Laterality: Right;  [place], MA, CCC-SLP Speech Therapy     TODAY-DAY OF DISCHARGE:  Subjective:   Carol [name] today has no headache,no chest abdominal pain,no new weakness tingling or numbness, feels much better wants to go home today.   Objective:   Blood pressure (!) 158/85, pulse 93, temperature 98 F (36.7 C), temperature source Oral, resp. rate (!) 24, height 5\' 4"  (1.626 m), weight 96 kg, SpO2 97 %. No intake or output data in the 24 hours ending 11/07/22 1137  Filed Weights   11/06/22 0300 11/06/22 0710 11/07/22 0500  Weight: 97.4 kg 97.4 kg 96 kg    Exam: Awake Alert, Oriented *3, No new F.N deficits, Normal affect [place].AT,PERRAL Supple Neck,No JVD, No cervical lymphadenopathy appriciated.   Symmetrical Chest wall movement, Good air movement bilaterally, CTAB RRR,No Gallops,Rubs or new Murmurs, No Parasternal Heave +ve B.Sounds, Abd Soft, Non tender, No organomegaly appriciated, No rebound -guarding or rigidity. No Cyanosis, Clubbing or edema, No new Rash or bruise   PERTINENT RADIOLOGIC STUDIES: No results found.   PERTINENT LAB RESULTS: CBC: No results for input(s): "WBC", "HGB", "HCT", "PLT" in the last 72 hours. CMET CMP     Component Value Date/Time   NA 134 (L) 11/07/2022 0253   K 4.1 11/07/2022 0253   CL 107 11/07/2022 0253   CO2 19 (L) 11/07/2022 0253   GLUCOSE 94 11/07/2022 0253   BUN 36 (H) 11/07/2022 0253   CREATININE 3.25 (H) 11/07/2022 0253   CREATININE 1.36 (H) 11/21/2020 1451   CALCIUM 8.1 (L) 11/07/2022 0253   PROT 6.2 (L) 11/01/2022 1214   ALBUMIN 2.0 (L) 11/01/2022 1214   AST 22 11/01/2022  1214   ALT 10 11/01/2022 1214   ALKPHOS 124 11/01/2022 1214   BILITOT 0.8 11/01/2022 1214   GFRNONAA 16 (L) 11/07/2022 0253   GFRNONAA 43 (L) 11/21/2020 1451   GFRAA 50 (L) 11/21/2020 1451    GFR Estimated Creatinine Clearance: 20.7 mL/min (A) (by C-G formula based on SCr of 3.25 mg/dL (H)). No results for input(s): "LIPASE", "AMYLASE" in the last 72 hours. No results for input(s): "CKTOTAL", "CKMB", "CKMBINDEX", "TROPONINI" in the last 72 hours. Invalid input(s): "POCBNP" No results for input(s): "DDIMER" in the last 72 hours. No results for input(s): "HGBA1C" in the last 72 hours. No results for input(s): "CHOL", "HDL", "LDLCALC", "TRIG", "CHOLHDL", "LDLDIRECT" in the last 72 hours. No results for input(s): "TSH", "T4TOTAL", "T3FREE", "THYROIDAB" in the last 72 hours.  Invalid input(s): "FREET3" No results for input(s): "VITAMINB12", "FOLATE", "FERRITIN", "TIBC", "IRON", "RETICCTPCT" in the last 72 hours. Coags: No results for input(s): "INR" in the last 72 hours.  Invalid input(s): "PT" Microbiology: No results found for this or any previous  visit (from the past 240 hour(s)).  FURTHER DISCHARGE INSTRUCTIONS:  Get Medicines reviewed and adjusted: Please take all your medications with you for your next visit with your Primary MD  Laboratory/radiological data: Please request your Primary MD to go over all hospital tests and procedure/radiological results at the follow up, please ask your Primary MD to get all Hospital records sent to his/her office.  In some cases, they will be blood work, cultures and biopsy results pending at the time of your discharge. Please request that your primary care M.D. goes through all the records of your hospital data and follows up on these results.  Also Note the following: If you experience worsening of your admission symptoms, develop shortness of breath, life threatening emergency, suicidal or homicidal thoughts you must seek medical attention immediately by calling 911 or calling your MD immediately  if symptoms less severe.  You must read complete instructions/literature along with all the possible adverse reactions/side effects for all the Medicines you take and that have been prescribed to you. Take any new Medicines after you have completely understood and accpet all the possible adverse reactions/side effects.   Do not drive when taking Pain medications or sleeping medications (Benzodaizepines)  Do not take more than prescribed Pain, Sleep and Anxiety Medications. It is not advisable to combine anxiety,sleep and pain medications without talking with your primary care practitioner  Special Instructions: If you have smoked or chewed Tobacco  in the last 2 yrs please stop smoking, stop any regular Alcohol  and or any Recreational drug use.  Wear Seat belts while driving.  Please note: You were cared for by a hospitalist during your hospital stay. Once you are discharged, your primary care physician will handle any further medical issues. Please note that NO REFILLS for any discharge  medications will be authorized once you are discharged, as it is imperative that you return to your primary care physician (or establish a relationship with a primary care physician if you do not have one) for your post hospital discharge needs so that they can reassess your need for medications and monitor your lab values.  Total Time spent coordinating discharge including counseling, education and face to face time equals greater than 30 minutes.  SignedOren Binet 11/07/2022 11:37 AM

## 2022-11-07 NOTE — Progress Notes (Signed)
CSW met with patient and fiance at bedside. CSW spoke with patient about substance use and she reported agreement with going to [facility] contacted them but unable to get anyone on the phone so info placed on AVS for patient to follow up. Fiance reported being hopeful for assistance is finding out which medications she needs to be taking.   Gilmore Laroche, MSW, [facility]

## 2022-11-07 NOTE — TOC Transition Note (Signed)
Transition of Care [facility]) - CM/SW Discharge Note   Patient Details  Name: Carol [name] MRN: [id number] Date of Birth: [birth year]  [facility]) CM/SW Contact:  Levonne Lapping, RN Phone Number: 11/07/2022, 11:47 AM   Clinical Narrative:     Patient ready for DC today. Home Health SN and Aide have been requested- [facility] (Choice)  Fiance, Elberta Fortis will transport home. Patient has been recommended OP Psych follow up.  No additional TOC needs      Final next level of care: Home w Home Health Services Barriers to Discharge: Other (must enter comment) (One more Laix infusion prior to DC)  (lasix)    Patient Goals and CMS Choice CMS Medicare.gov Compare Post Acute Care list provided to:: Patient Choice offered to / list presented to : Patient  Discharge Placement                         Discharge Plan and Services Additional resources added to the After Visit Summary for     Discharge Planning Services: CM Consult Post Acute Care Choice: Home Health          DME Arranged: N/A DME Agency: NA       HH Arranged: RN, Nurse's Aide          Social Determinants of Health (SDOH) Interventions SDOH Screenings   Food Insecurity: No Food Insecurity (11/05/2022)  Housing: Low Risk  (11/05/2022)  Transportation Needs: No Transportation Needs (11/05/2022)  Utilities: Not At Risk (11/05/2022)  Financial Resource Strain: Low Risk  (04/04/2022)  Tobacco Use: High Risk (11/07/2022)     Readmission Risk Interventions    03/27/2022   10:36 AM  Readmission Risk Prevention Plan  Transportation Screening Complete  Medication Review ([place]) Complete  PCP or Specialist appointment within 3-5 days of discharge Complete  HRI or [place] Complete  SW Recovery Care/Counseling Consult Complete  [place] Not Applicable

## 2022-11-07 NOTE — Progress Notes (Signed)
Patient adamantly requesting that she be discharged today-she does not want to remain hospitalized Her legs are still swollen but she had a good response to IV diuretics yesterday. She is willing to stay to get another dose of IV Lasix-but wants to leave the hospital today following that.  Will plan on increasing her oral diuretics and hopefully getting her a quick outpatient cardiology follow-up. See discharge summary for details.

## 2022-11-07 NOTE — Plan of Care (Signed)
  Problem: Education: Goal: Knowledge of General Education information will improve Description: Including pain rating scale, medication(s)/side effects and non-pharmacologic comfort measures Outcome: Progressing   Problem: Clinical Measurements: Goal: Will remain free from infection Outcome: Progressing Goal: Cardiovascular complication will be avoided Outcome: Progressing   Problem: Clinical Measurements: Goal: Cardiovascular complication will be avoided Outcome: Progressing   Problem: Nutrition: Goal: Adequate nutrition will be maintained Outcome: Progressing   Problem: Coping: Goal: Level of anxiety will decrease Outcome: Progressing   Problem: Elimination: Goal: Will not experience complications related to bowel motility Outcome: Progressing   Problem: Skin Integrity: Goal: Risk for impaired skin integrity will decrease Outcome: Progressing   Problem: Health Behavior/Discharge Planning: Goal: Ability to manage health-related needs will improve Outcome: Progressing   Problem: Metabolic: Goal: Ability to maintain appropriate glucose levels will improve Outcome: Progressing   Problem: Nutritional: Goal: Maintenance of adequate nutrition will improve Outcome: Progressing   Problem: Skin Integrity: Goal: Risk for impaired skin integrity will decrease Outcome: Progressing   Problem: Tissue Perfusion: Goal: Adequacy of tissue perfusion will improve Outcome: Progressing

## 2022-11-19 ENCOUNTER — Encounter (HOSPITAL_COMMUNITY): Payer: 59

## 2022-11-28 ENCOUNTER — Other Ambulatory Visit (HOSPITAL_COMMUNITY): Payer: Self-pay | Admitting: Family Medicine

## 2022-12-14 ENCOUNTER — Telehealth (HOSPITAL_COMMUNITY): Payer: Self-pay

## 2022-12-14 NOTE — Telephone Encounter (Signed)
Called and left patient a voice message to confirm/remind patient of their appointment at the [facility] on 12/17/22.

## 2022-12-17 ENCOUNTER — Encounter (HOSPITAL_COMMUNITY): Payer: Self-pay

## 2022-12-17 ENCOUNTER — Ambulatory Visit (HOSPITAL_COMMUNITY)
Admission: RE | Admit: 2022-12-17 | Discharge: 2022-12-17 | Disposition: A | Discharge status: Home / Self Care | Payer: 59 | Source: Ambulatory Visit | Attending: Family Medicine | Admitting: Family Medicine

## 2022-12-17 VITALS — BP 138/80 | HR 84 | Wt 179.2 lb

## 2022-12-17 DIAGNOSIS — F1910 Other psychoactive substance abuse, uncomplicated: Secondary | ICD-10-CM

## 2022-12-17 DIAGNOSIS — I5032 Chronic diastolic (congestive) heart failure: Secondary | ICD-10-CM

## 2022-12-17 DIAGNOSIS — Z7902 Long term (current) use of antithrombotics/antiplatelets: Secondary | ICD-10-CM | POA: Insufficient documentation

## 2022-12-17 DIAGNOSIS — I472 Ventricular tachycardia, unspecified: Secondary | ICD-10-CM | POA: Diagnosis not present

## 2022-12-17 DIAGNOSIS — R0681 Apnea, not elsewhere classified: Secondary | ICD-10-CM | POA: Diagnosis not present

## 2022-12-17 DIAGNOSIS — Z7982 Long term (current) use of aspirin: Secondary | ICD-10-CM | POA: Insufficient documentation

## 2022-12-17 DIAGNOSIS — I2510 Atherosclerotic heart disease of native coronary artery without angina pectoris: Secondary | ICD-10-CM | POA: Insufficient documentation

## 2022-12-17 DIAGNOSIS — D869 Sarcoidosis, unspecified: Secondary | ICD-10-CM | POA: Insufficient documentation

## 2022-12-17 DIAGNOSIS — I252 Old myocardial infarction: Secondary | ICD-10-CM | POA: Diagnosis not present

## 2022-12-17 DIAGNOSIS — I5030 Unspecified diastolic (congestive) heart failure: Secondary | ICD-10-CM | POA: Insufficient documentation

## 2022-12-17 DIAGNOSIS — N184 Chronic kidney disease, stage 4 (severe): Secondary | ICD-10-CM | POA: Diagnosis not present

## 2022-12-17 DIAGNOSIS — I130 Hypertensive heart and chronic kidney disease with heart failure and stage 1 through stage 4 chronic kidney disease, or unspecified chronic kidney disease: Secondary | ICD-10-CM | POA: Insufficient documentation

## 2022-12-17 DIAGNOSIS — I161 Hypertensive emergency: Secondary | ICD-10-CM | POA: Diagnosis not present

## 2022-12-17 DIAGNOSIS — E1122 Type 2 diabetes mellitus with diabetic chronic kidney disease: Secondary | ICD-10-CM | POA: Insufficient documentation

## 2022-12-17 DIAGNOSIS — E785 Hyperlipidemia, unspecified: Secondary | ICD-10-CM | POA: Insufficient documentation

## 2022-12-17 DIAGNOSIS — I4729 Other ventricular tachycardia: Secondary | ICD-10-CM

## 2022-12-17 DIAGNOSIS — I10 Essential (primary) hypertension: Secondary | ICD-10-CM | POA: Diagnosis not present

## 2022-12-17 DIAGNOSIS — Z79899 Other long term (current) drug therapy: Secondary | ICD-10-CM | POA: Diagnosis not present

## 2022-12-17 DIAGNOSIS — R5383 Other fatigue: Secondary | ICD-10-CM

## 2022-12-17 LAB — BASIC METABOLIC PANEL
Anion gap: 8 (ref 5–15)
BUN: 45 mg/dL — ABNORMAL HIGH (ref 6–20)
CO2: 19 mmol/L — ABNORMAL LOW (ref 22–32)
Calcium: 8.5 mg/dL — ABNORMAL LOW (ref 8.9–10.3)
Chloride: 108 mmol/L (ref 98–111)
Creatinine, Ser: 4.61 mg/dL — ABNORMAL HIGH (ref 0.44–1.00)
GFR, Estimated: 10 mL/min — ABNORMAL LOW (ref >60)
Glucose, Bld: 152 mg/dL — ABNORMAL HIGH (ref 70–99)
Potassium: 4.8 mmol/L (ref 3.5–5.1)
Sodium: 135 mmol/L (ref 135–145)

## 2022-12-17 LAB — BRAIN NATRIURETIC PEPTIDE: B Natriuretic Peptide: 187.9 pg/mL — ABNORMAL HIGH (ref 0.0–100.0)

## 2022-12-17 NOTE — Patient Instructions (Addendum)
Sleep study ordered - sleep lab will call you to schedule this appointment.  Labs today - will call you if abnormal. Return in 2 - 3 weeks for Heart Failure Pharmacy appointment. Please bring all your medications.  Return to Heart Failure APP CLinic in 4 - 6 weeks. Call us at [phone] if any issues or questions prior to your next appointment.

## 2022-12-17 NOTE — Progress Notes (Signed)
Advanced [facility] Failure Clinic Note  PCP: Okey Regal  NP  Primary Cardiologist: Dr Carolan Clines HF Cardiologist: Dr. Gala Romney  HPI: 62 y.o. female with history of HTN, HLD, DM, crack/cocaine use, ETOH, tobacco abuse, CKD III, hx of sarcoidosis involving liver (previously followed by Hepatology at [facility], no-showed follow-ups). Limited reading ability.    Admitted in April 2023 with NSTEMI and uncontrolled HTN. Echo: EF 60-65%, mild LVH, grade I DD, RV okay. LHC demonstrated occluded small subranch off right PDA (treated medically), mild disease in LAD and RCA. Developed AKI post cath with Scr peaking at 3.2.    Readmitted in May 2023 with episodes of hypoglycemia. Received IV fluids. Developed volume overload and was diuresed. BP meds adjusted d/t hypertensive urgency.   Readmitted 06/23 with CP and hypertensive emergency. Minimally elevated HS troponin with flat trend. CTA chest negative for aortic dissection. CP atypical for angina. UDS + cocaine. Had been using crack cocaine since April d/t stress and using ibuprofen daily. She was volume overloaded and diuresed.BP rapidly corrected and became hypotensive. Had recurrent AKI and required iHD. Renal function improved enough to remove HD cath prior to discharge.     Admitted again 07/23 with decreased responsiveness and hypoglycemia. Had not been taking medications at home. She was volume overloaded and diuresised with IV lasix. Transitioned to torsemide 20 mg daily. Scr upper 2s - 3s during admission.   Unfortunately admitted again 08/03-08/08/23 with a/c CHF and recent cocaine use.  She was seen in HF [facility] clinic 04/04/22. Volume overloaded. Torsemide was increased 40 mg x4 days then 20 torsemide. Hydralazine was increased to 25 mg three times a day. Referred to HF Clinic and HF Paramedicine.  Follow up 9/23, NYHA III symptoms & orthostatic. Instructed to hold torsemide x 2 days then resume at lower dose 10 mg daily.  Seen in ED 05/09/22  with CP after using cocaine. Also with a/c CHF, concern for medication noncompliance. Given IV lasix with improvement in symptoms. Hydralazine stopped due to low BP.  cMRI 11/23: not consistent with cardiac sarcoid. 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   Discharged from paramedicine 2/2 noncompliance 2/24.   Admitted 3/24 for hypoglycemia and dysphagia. Was also volume overloaded, patient insisted on going home despite still having fluid on her, diuretics increased. UDS + cocaine.   Today she returns for post hospital follow up. Overall feeling fine. Denies palpitations, CP, dizziness, edema, or PND/Orthopnea. Denies SOB, only with prolonged exertion. Appetite ok. No fever or chills. Weight at home 170 pounds. Taking all medications, when asked about some doses, doesn't know what she's taking.  Is still doing cocaine around 2x/week. Denies tobacco or ETOH use.   Cardiac Studies - cMRI 11/23: not consistent with cardiac sarcoid. 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  - Echo (5/23): EF 60-65%, mild LVH, grade I DD, RV okay.  - LHC (5/23): demonstrated occluded small subranch off right PDA (treated medically), mild disease in LAD and RCA.  ROS: All systems negative except as listed in HPI, PMH and Problem List.  SH:  Social History   Socioeconomic History   Marital status: Single    Spouse name: Not on file   Number of children: 3   Years of education: Not on file   Highest education level: Not on file  Occupational History   Not on file  Tobacco Use   Smoking status: Some Days    Packs/day: 0.25    Years: 3.00    Additional  pack years: 0.00    Total pack years: 0.75    Types: Cigarettes   Smokeless tobacco: Never  Vaping Use   Vaping Use: Never used  Substance and Sexual Activity   Alcohol use: Yes    Alcohol/week: 0.0 standard drinks of alcohol    Comment: occasional   Drug use: No    Comment: none for 13 years   Sexual activity: Not on file  Other  Topics Concern   Not on file  Social History Narrative   Not on file   Social Determinants of Health   Financial Resource Strain: Low Risk  (04/04/2022)   Overall Financial Resource Strain (CARDIA)    Difficulty of Paying Living Expenses: Not very hard  Food Insecurity: No Food Insecurity (11/05/2022)   Hunger Vital Sign    Worried About Running Out of Food in the Last Year: Never true    Ran Out of Food in the Last Year: Never true  Transportation Needs: No Transportation Needs (11/05/2022)   PRAPARE - Administrator, Civil Service (Medical): No    Lack of Transportation (Non-Medical): No  Physical Activity: Not on file  Stress: Not on file  Social Connections: Not on file  Intimate Partner Violence: Not At Risk (11/05/2022)   Humiliation, Afraid, Rape, and Kick questionnaire    Fear of Current or Ex-Partner: No    Emotionally Abused: No    Physically Abused: No    Sexually Abused: No    FH:  Family History  Problem Relation Age of Onset   Diabetes Mother    Kidney disease Mother    Seizures Father    Hypertension Father    Stroke Father    Asthma Other     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     amLODipine (NORVASC) 10 MG tablet Take 1 tablet (10 mg total) by mouth daily. 30 tablet 3   aspirin EC 81 MG  tablet Take 1 tablet (81 mg total) by mouth daily. Swallow whole. 30 tablet 0   atorvastatin (LIPITOR) 80 MG tablet Take 1 tablet (80 mg total) by mouth daily. 30 tablet 0   budesonide-formoterol (SYMBICORT) 160-4.5 MCG/ACT inhaler Inhale 2 puffs into the lungs every morning.     cetirizine (ZYRTEC) 10 MG tablet Take 10 mg by mouth daily as needed for allergies.     clopidogrel (PLAVIX) 75 MG tablet Take 1 tablet (75 mg total) by mouth daily. 30 tablet 0   ergocalciferol (VITAMIN D2) 1.25 MG (50000 UT) capsule Take 1 capsule (50,000 Units total) by mouth once a week. Wednesday 30 capsule 0   gabapentin (NEURONTIN) 100 MG capsule Take 2 capsules (200 mg total) by mouth 2 (two) times daily. (Patient taking differently: Take 100 mg by mouth at bedtime.)  60 capsule 0   hydrALAZINE (APRESOLINE) 50 MG tablet Take 1 tablet (50 mg total) by mouth 3 (three) times daily. 270 tablet 3   hydrOXYzine (ATARAX) 50 MG tablet Take 50 mg by mouth at bedtime.     isosorbide mononitrate (IMDUR) 30 MG 24 hr tablet Take 1/2 tablet (15 mg total) by mouth daily. 15 tablet 3   linagliptin (TRADJENTA) 5 MG TABS tablet Take 1 tablet (5 mg total) by mouth daily. 30 tablet 1   melatonin 5 MG TABS Take 1 tablet (5 mg total) by mouth at bedtime as needed (sleep). 30 tablet 1   mirtazapine (REMERON) 7.5 MG tablet Take 1 tablet (7.5 mg total) by mouth at bedtime. 30 tablet 3   Nebivolol HCl 20 MG TABS Take 1 tablet (20 mg total) by mouth every morning. 30 tablet 0   nitroGLYCERIN (NITROSTAT) 0.4 MG SL tablet Place 1 tablet (0.4 mg total) under the tongue every 5 (five) minutes as needed for chest pain. 30 tablet 0   pantoprazole (PROTONIX) 40 MG tablet Take 1 tablet (40 mg total) by mouth daily at 12 noon. 30 tablet 0   potassium chloride (KLOR-CON M) 10 MEQ tablet Take 1 tablet (10 mEq total) by mouth daily. 30 tablet 3   torsemide (DEMADEX) 20 MG tablet Take 2 tablets (40 mg total) by mouth daily. 90 tablet 3   No current  facility-administered medications for this encounter.   BP 138/80   Pulse 84   Wt 81.3 kg (179 lb 3.2 oz)   SpO2 100%   BMI 30.76 kg/m   Wt Readings from Last 3 Encounters:  12/17/22 81.3 kg (179 lb 3.2 oz)  11/07/22 96 kg (211 lb 10.3 oz)  10/16/22 89 kg (196 lb 3.2 oz)   PHYSICAL EXAM: General:  well appearing.  No respiratory difficulty HEENT: normal Neck: supple. JVD ~9 cm. Carotids 2+ bilat; no bruits. No lymphadenopathy or thyromegaly appreciated. Cor: PMI nondisplaced. Regular rate & rhythm. No rubs, gallops or murmurs. Lungs: clear Abdomen: soft, nontender, nondistended. No hepatosplenomegaly. No bruits or masses. Good bowel sounds. Extremities: no cyanosis, clubbing, rash, trace BLE edema  Neuro: alert & oriented x 3, cranial nerves grossly intact. moves all 4 extremities w/o difficulty. Affect pleasant.   ECG (personally reviewed): NSR 80 bpm  ASSESSMENT & PLAN: 1. HFpEF - Echo 01/2014: EF 65%, moderate LVH, RV okay - Echo 5/23: EF 60-65%, mild LVH, grade I DD, RV okay, mild LAE - Multiple admissions over the last 6 months with a/c CHF, frequently in setting of hypertensive emergency, cocaine use and medication noncompliance - She does have history of sarcoidosis of liver. Has recurrent CHF exacerbations and runs of NSVT noted on tele during recent admits. Need to rule out cardiac sarcoidosis.  - cMRI 11/23: not consistent with cardiac sarcoid. 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 No ARNi/ARB or MRA. - NYHA II-early III. Volume ok today.  - Continue hydralazine 50 mg tid for afterload reduction. - Continue Imdur 15 mg daily. - Continue torsemide 40 mg daily. (States she takes 1 pill?, educated on taking 2/day) - Continue nebivolol 20 mg daily. - BMET / BNP today.  2. Hypertension - Elevated today. - Continue meds as above.   3. CAD - NSTEMI (4/23). LHC with occluded small subranch of PDA (treated medically) - Continue aspirin,  plavix, statin +beta blocker - No chest pain.    4. CKD IV - Multiple recurrent AKIs recently. Briefly  required HD in 01/2022.  - New Scr baseline ~ 2.5 - She has been referred to nephrology, saw in December/January per patient report - BMET today.   5. Substance abuse - Continues to use crack cocaine. - Recommended complete cessation   6. Sarcoidosis of liver - Previously followed by [facility]. No showed subsequent follow-ups.  - Ruling out cardiac sarcoid as above. - CT chest 06/23 with small mediastinal and paratracheal nodes, mild subpleural reticular interstitial changes.  - cMRI 11/23: not consistent with cardiac sarcoid. - She has been referred to Pulmonary, didn't see   7. NSVT - Wore Zio for 2 days. Never mailed it in - Continue beta blocker.  8. Daytime fatigue/snoring - Arrange in lab sleep study  9. Limited Reading Ability/SDOH - Finished 12 grade but didn't pass test so she did not receive diploma. She was in special needs classes. - Paramedicine discharged 2/2 noncompliance  Follow up in 2-3 weeks with PharmD to ensure she's taking everything correctly (not sure what all she's supposed to be taking). Follow up with APP 4-6 weeks.   Virgil Slinger Merlene Morse AGACNP-BC  3:12 PM

## 2022-12-19 ENCOUNTER — Telehealth (HOSPITAL_COMMUNITY): Payer: Self-pay | Admitting: *Deleted

## 2022-12-19 DIAGNOSIS — I5032 Chronic diastolic (congestive) heart failure: Secondary | ICD-10-CM

## 2022-12-19 NOTE — Telephone Encounter (Signed)
Called patient per Unice Cobble, PA with following:  "idney function elevated (SCr 4.61), fluid status stable. Have patient hold Torsemide for 3 days then resume. BMET in 7-10 days. Please make sure she has f/u with nephrology scheduled."  Pt verbalized understanding of above; repeat lab scheduled and ordered. Pt confirms she has appointment with Nephrology.

## 2022-12-25 ENCOUNTER — Telehealth (HOSPITAL_COMMUNITY): Payer: Self-pay | Admitting: Vascular Surgery

## 2022-12-25 NOTE — Telephone Encounter (Signed)
Lvm giving sleep study appt 

## 2022-12-31 ENCOUNTER — Other Ambulatory Visit (HOSPITAL_COMMUNITY): Payer: 59

## 2023-01-07 ENCOUNTER — Ambulatory Visit: Payer: Medicare Other | Admitting: Internal Medicine

## 2023-01-07 ENCOUNTER — Telehealth (HOSPITAL_COMMUNITY): Payer: Self-pay | Admitting: Pharmacist

## 2023-01-07 ENCOUNTER — Ambulatory Visit (HOSPITAL_COMMUNITY)
Admission: RE | Admit: 2023-01-07 | Discharge: 2023-01-07 | Disposition: A | Discharge status: Home / Self Care | Payer: 59 | Source: Ambulatory Visit | Attending: Cardiology | Admitting: Cardiology

## 2023-01-07 VITALS — BP 128/84 | HR 67 | Wt 182.0 lb

## 2023-01-07 DIAGNOSIS — I5032 Chronic diastolic (congestive) heart failure: Secondary | ICD-10-CM | POA: Diagnosis present

## 2023-01-07 LAB — BASIC METABOLIC PANEL
Anion gap: 9 (ref 5–15)
BUN: 32 mg/dL — ABNORMAL HIGH (ref 6–20)
CO2: 19 mmol/L — ABNORMAL LOW (ref 22–32)
Calcium: 8.7 mg/dL — ABNORMAL LOW (ref 8.9–10.3)
Chloride: 107 mmol/L (ref 98–111)
Creatinine, Ser: 3.49 mg/dL — ABNORMAL HIGH (ref 0.44–1.00)
GFR, Estimated: 14 mL/min — ABNORMAL LOW (ref >60)
Glucose, Bld: 149 mg/dL — ABNORMAL HIGH (ref 70–99)
Potassium: 4.4 mmol/L (ref 3.5–5.1)
Sodium: 135 mmol/L (ref 135–145)

## 2023-01-07 LAB — BRAIN NATRIURETIC PEPTIDE: B Natriuretic Peptide: 1461.8 pg/mL — ABNORMAL HIGH (ref 0.0–100.0)

## 2023-01-07 MED ORDER — HYDRALAZINE HCL 50 MG PO TABS: 50.0000 mg | ORAL_TABLET | Freq: Three times a day (TID) | ORAL | 3 refills | Status: DC

## 2023-01-07 MED ORDER — TORSEMIDE 20 MG PO TABS: 40.0000 mg | ORAL_TABLET | Freq: Every day | ORAL | 6 refills | Status: DC

## 2023-01-07 MED ORDER — TORSEMIDE 10 MG PO TABS: 10.0000 mg | ORAL_TABLET | Freq: Every day | ORAL | 6 refills | Status: DC

## 2023-01-07 MED ORDER — NEBIVOLOL HCL 20 MG PO TABS: 20.0000 mg | ORAL_TABLET | Freq: Every morning | ORAL | 0 refills | Status: DC

## 2023-01-07 MED ORDER — ISOSORBIDE MONONITRATE ER 30 MG PO TB24: 15.0000 mg | ORAL_TABLET | Freq: Every day | ORAL | 3 refills | Status: DC

## 2023-01-07 MED ORDER — POTASSIUM CHLORIDE ER 10 MEQ PO TBCR: 10.0000 meq | EXTENDED_RELEASE_TABLET | Freq: Every day | ORAL | 3 refills | Status: DC

## 2023-01-07 MED ORDER — NEBIVOLOL HCL 10 MG PO TABS: 10.0000 mg | ORAL_TABLET | Freq: Every day | ORAL | 6 refills | Status: DC

## 2023-01-07 NOTE — Progress Notes (Signed)
Advanced Heart Failure Clinic Note    PCP: Okey Regal  NP  Primary Cardiologist: Dr Carolan Clines HF Cardiologist: Dr. Gala Romney  HPI:  61 y.o. female with history of HTN, HLD, DM, crack/cocaine use, ETOH, tobacco abuse, CKD III, hx of sarcoidosis involving liver (previously followed by Hepatology at [facility], no-showed follow-ups). Limited reading ability.    Admitted in April 2023 with NSTEMI and uncontrolled HTN. Echo: EF 60-65%, mild LVH, grade I DD, RV okay. LHC demonstrated occluded small subranch off right PDA (treated medically), mild disease in LAD and RCA. Developed AKI post cath with Scr peaking at 3.2.    Readmitted in May 2023 with episodes of hypoglycemia. Received IV fluids. Developed volume overload and was diuresed. BP meds adjusted d/t hypertensive urgency.   Readmitted 01/2022 with CP and hypertensive emergency. Minimally elevated HS troponin with flat trend. CTA chest negative for aortic dissection. CP atypical for angina. UDS + cocaine. Had been using crack cocaine since April d/t stress and using ibuprofen daily. She was volume overloaded and diuresed. BP rapidly corrected and became hypotensive. Had recurrent AKI and required iHD, but renal function improved enough to remove HD cath prior to discharge.     Admitted again 02/2022 with decreased responsiveness and hypoglycemia. Had not been taking medications at home. She was volume overloaded and diuresised with IV Lasix. Transitioned to torsemide 20 mg daily. Scr upper 2s - 3s during admission.   Unfortunately admitted again 08/03-08/03/2022 with a/c CHF and recent cocaine use.   She was seen in HF [facility] clinic on 04/04/2022. Volume overloaded. Torsemide was increased 40 mg x4 days then 20 torsemide. Hydralazine was increased to 25 mg three times a day. Referred to HF Clinic and HF paramedicine.   At follow up 04/2022, pt had NYHA III symptoms & orthostasis. Instructed to hold torsemide x 2 days then resume at lower dose 10  mg daily.   Seen in ED 05/09/2022 with CP after using cocaine. Also with a/c CHF, concern for medication noncompliance. Given IV Lasix with improvement in symptoms. Hydralazine stopped due to low BP.   cMRI 06/2022: not consistent with cardiac sarcoid. 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    Discharged from paramedicine 2/2 noncompliance 09/2022.    Admitted 10/2022 for hypoglycemia and dysphagia. Was also volume overloaded, patient insisted on going home despite still having fluid on her, diuretics increased. UDS + cocaine.    On 12/17/2022 she returned for post hospital follow up. Overall was feeling fine. SOB only with prolonged exertion. Weight at home was 170 pounds. Reported taking all medications. Was still doing cocaine around 2x/week. Denied tobacco or ETOH use.   Today she returns to HF clinic for pharmacy follow up. Patient has overall been feeling ok and reported rare pain on her right side that seems musculoskeletal in nature. Patient stated she has been not been able to sleep well recently and is out of her sleep medication. No lightheadedness, fatigue, or chest pain reported. Patient reported orthostasis/dizziness twice daily. Patient stated her heart skips a beat occasionally but it has not occurred in the past few weeks and has improved since she restarted her HF medications. Reports occasional SOB during some ADLs such as vacuuming and walking up stairs. Normal weight at home is around 180 lbs. Patient reported recent swelling around her ankles and is currently taking torsemide 40 mg daily. Mild pitting edema in both ankles. Patient uses pillows under her feet at night to help reduce swelling. Reported  neuropathic pain this past weekend, and took 2-3 motrin 800 mg tablets each day. Denies orthopnea. Patients recent appetite has been off and on as she has tried to go vegetarian and do a low salt diet. Patient reported being off of illicit substances for 3 months, however UDS was  positive back in March.  HF Medications: Bystolic 10 mg daily Hydralazine 50 mg TID Imdur 15 mg daily Torsemide 40 mg daily with potassium 10 meq daily  Has the patient been experiencing any side effects to the medications prescribed? Mild headache from Bystolic/Imdur.  Does the patient have any problems obtaining medications due to transportation or finances?   No, [employer] of regimen: fair Understanding of indications: fair Potential of compliance: fair Patient understands to avoid NSAIDs. Patient understands to avoid decongestants.    Pertinent Lab Values: 12/17/2022: Serum creatinine 4.61, BUN 45, Potassium 4.8, Sodium 135, BNP 187.9  Vital Signs: Weight: 182 lbs (last clinic weight: 179.2 lbs) Blood pressure: 128/84 mmHg  Heart rate: 67 bpm   Assessment/Plan: 1. HFpEF - Echo 01/2014: EF 65%, moderate LVH, RV okay - Echo 12/2021: EF 60-65%, mild LVH, grade I DD, RV okay, mild LAE - Multiple admissions over the last 6 months with a/c CHF, frequently in setting of hypertensive emergency, cocaine use and medication noncompliance - She does have history of sarcoidosis of liver. Has recurrent CHF exacerbations and runs of NSVT noted on tele during recent admits. cMRI 06/2022 was not representative of cardiac sarcoidosis. 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 - GDMT limited by AKI on CKD. No ARNi/ARB or MRA. - NYHA II-early III. Orthostatic at home, but with mild bilateral pitting edema and no significant respiratory symptoms. Labs 12/17/22 revealed significant AKI, then torsemide was increased from 20 mg to 40 mg daily. Will change torsemide back to prior dose given recent AKI and repeat BMET with BNP today. Counseled on elevation and compression stockings for lower extremity edema. - Reduce torsemide to 20 mg daily. Follow up today's lab results to increase again if BNP elevated and AKI improved. - Continue hydralazine 50 mg TID for  afterload reduction. - Continue Imdur 15 mg daily.  - Continue nebivolol 10 mg daily. With cocaine use and concomitant nitrate, patient may benefit from transitioning to carvedilol in the future - BMET / BNP today pending.   2. Hypertension - Continue meds as above.   3. CAD - NSTEMI (11/2021). LHC with occluded small subranch of PDA (treated medically) - Continue aspirin, plavix, statin + beta blocker - No chest pain.    4. CKD IV - Multiple recurrent AKIs recently. Briefly required HD in 01/2022.  - New Scr baseline ~ 3 - She has been referred to nephrology, saw in December/January per patient report - BMET today.   5. Substance abuse - Reported stopped using cocaine 3 months ago, but has positive UDS within the last 3 months.  -Counseled on cocaine cessation -Can transition to carvedilol rather than Bystolic if continuing to use cocaine   6. Sarcoidosis of liver - Previously followed by hepatology at [facility]. No showed subsequent follow-ups.  - Ruling out cardiac sarcoid as above. - CT chest 01/2022 with small mediastinal and paratracheal nodes, mild subpleural reticular interstitial changes.  - cMRI 06/2022: not consistent with cardiac sarcoid. - She has been referred to Pulmonary, but didn't see   7. NSVT - Continue beta blocker.   8. Daytime fatigue/snoring - lab sleep study on 01/13/2023. Follow up scheduled this month.  9. Limited Reading Ability/SDOH - Finished 12 grade but didn't pass test so she did not receive diploma. She was in special needs classes. - Paramedicine discharged 2/2 noncompliance  Follow up on 01/29/2023 with APP  Karle Plumber, PharmD, BCPS, BCCP, CPP Heart Failure Clinic Pharmacist [phone]

## 2023-01-07 NOTE — Telephone Encounter (Addendum)
Post-clinic labs revealed improvement to Creatinine and BUN with an increase in BNP to 1400. Given significant rise in BNP, bilateral edema with no acute changes in respiratory status, and improvement in AKI, the patient was called to make medication changes. Attempted to call x 3 with no response. Left voicemail with instructions to call the clinic back for medication changes. When able to reach the patient, the following changes will be made:  Increase torsemide to 60 mg daily x 3 days, then resume previous dose of 40 mg daily Continue potassium 10 meq daily Repeat labs in 7-10 days Reach out to clinic if symptoms worsen  Thank you for involving pharmacy in this patient's care.  Enos Fling, PharmD PGY2 Pharmacy Resident 01/07/2023 4:24 PM

## 2023-01-07 NOTE — Patient Instructions (Signed)
It was a pleasure seeing you today!  MEDICATIONS: -We are changing your medications today -Decrease torsemide to 1 tablet (20 mg) daily. Will call based on labs if changes are needed. Elevate legs and wear compression socks for peripheral edema. If you experience shortness of breath, please contact us. -Stop potassium -Call if you have questions about your medications.  LABS: -We will call you if your labs need attention.  NEXT APPOINTMENT: Return to clinic on 01/29/23 with heart failure team.  In general, to take care of your heart failure: -Limit your fluid intake to 2 Liters (half-gallon) per day.   -Limit your salt intake to ideally 2-3 grams (2000-3000 mg) per day. -Weigh yourself daily and record, and bring that "weight diary" to your next appointment.  (Weight gain of 2-3 pounds in 1 day typically means fluid weight.) -The medications for your heart are to help your heart and help you live longer.   -Please contact us before stopping any of your heart medications.  Call the clinic at [phone] with questions or to reschedule future appointments.

## 2023-01-09 NOTE — Telephone Encounter (Signed)
Attempted again to reach patient to increase diuretic dose with no response.

## 2023-01-11 ENCOUNTER — Ambulatory Visit: Payer: Medicare Other | Admitting: Internal Medicine

## 2023-01-11 NOTE — Progress Notes (Deleted)
Name: Carol [name]  MRN/ DOB: [id number], Jan 21, 1961   Age/ Sex: 62 y.o., female    PCP: Courtney Paris, NP   Reason for Endocrinology Evaluation: Type 2 Diabetes Mellitus     Date of Initial Endocrinology Visit: 01/11/2023     PATIENT IDENTIFIER: [name] is a 62 y.o. female with a past medical history of DM, HTN, dyslipidemia. The patient presented for initial endocrinology clinic visit on 01/11/2023 for consultative assistance with her diabetes management.    HPI: Carol [name] was    Diagnosed with DM in 2015 Prior Medications tried/Intolerance: *** Currently checking blood sugars *** x / day,  before breakfast and ***.  Hypoglycemia episodes : ***               Symptoms: ***                 Frequency: ***/  Hemoglobin A1c has ranged from 6.2% in 2024, peaking at 12.0% in 2015.   In terms of diet, the patient ***   HOME DIABETES REGIMEN: Tradjenta 5 mg daily  Statin: Yes ACE-I/ARB: No    METER DOWNLOAD SUMMARY: Date range evaluated: *** Fingerstick Blood Glucose Tests = *** Average Number Tests/Day = *** Overall Mean FS Glucose = *** Standard Deviation = ***  BG Ranges: Low = *** High = ***   Hypoglycemic Events/30 Days: BG < 50 = *** Episodes of symptomatic severe hypoglycemia = ***   DIABETIC COMPLICATIONS: Microvascular complications:  Neuropathy Denies: *** Last eye exam: Completed   Macrovascular complications:  CAD Denies:PVD, CVA   PAST HISTORY: Past Medical History:  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   Chronic back pain    Depression    Diabetes mellitus    GERD (gastroesophageal reflux disease)    Heart murmur    Hypertension    NSTEMI (non-ST elevated myocardial infarction) (HCC) 12/20/2021   Palpitations     Parkinson's disease    Past Surgical History:  Past Surgical History:  Procedure Laterality Date   BALLOON DILATION N/A 11/06/2022   Procedure: BALLOON DILATION;  Surgeon: Napoleon Form, MD;  Location: [facility];  Service: Gastroenterology;  Laterality: N/A;   CARPAL TUNNEL RELEASE     COLONOSCOPY WITH PROPOFOL N/A 11/06/2022   Procedure: COLONOSCOPY WITH PROPOFOL;  Surgeon: Napoleon Form, MD;  Location: [facility];  Service: Gastroenterology;  Laterality: N/A;   ESOPHAGOGASTRODUODENOSCOPY (EGD) WITH PROPOFOL N/A 11/06/2022   Procedure: ESOPHAGOGASTRODUODENOSCOPY (EGD) WITH PROPOFOL;  Surgeon: Napoleon Form, MD;  Location: [facility];  Service: Gastroenterology;  Laterality: N/A;   IR FLUORO GUIDE CV LINE RIGHT  01/26/2022   IR US GUIDE VASC ACCESS RIGHT  01/26/2022   LEFT HEART CATH AND CORONARY ANGIOGRAPHY N/A 12/20/2021   Procedure: LEFT HEART CATH AND CORONARY ANGIOGRAPHY;  Surgeon: Kathleene Hazel, MD;  Location: [facility] INVAS[facility] CV LAB;  Service: Cardiovascular;  Laterality: N/A;   OPEN REDUCTION INTERNAL FIXATION (ORIF) FINGER WITH RADIAL BONE GRAFT Right 09/12/2015   Procedure: RIGHT MIDDLE FINGER CLOSED REDUCTION AND PINNING;  Surgeon: Bradly Bienenstock, MD;  Location: [facility] OR;  Service: Orthopedics;  Laterality: Right;   TONSILLECTOMY     TUBAL LIGATION      Social History:  reports that she has been smoking cigarettes. She has a 0.75  pack-year smoking history. She has never used smokeless tobacco. She reports current alcohol use. She reports that she does not use drugs. Family History:  Family History  Problem Relation Age of Onset   Diabetes Mother    Kidney disease Mother    Seizures Father    Hypertension Father    Stroke Father    Asthma Other      HOME MEDICATIONS: Allergies as of 01/11/2023       Reactions   Penicillins Shortness Of Breath, Other (See Comments)   Caused yeast infection Has patient had a PCN reaction causing immediate rash,  facial/tongue/throat swelling, SOB or lightheadedness with hypotension: Yes Has patient had a PCN reaction causing severe rash involving mucus membranes or skin necrosis: No Has patient had a PCN reaction that required hospitalization pt was in the hospital at the time of the reaction Has patient had a PCN reaction occurring within the last 10 years: No If all of the above answers are "NO", then may proceed with Cephalosp   Ultram [tramadol] Other (See Comments)   Made her tongue raw        Medication List        Accurate as of Jan 11, 2023  7:16 AM. If you have any questions, ask your nurse or doctor.          albuterol 108 (90 Base) MCG/ACT inhaler Commonly known as: VENTOLIN HFA Inhale 2 puffs into the lungs every 4 (four) hours as needed for wheezing or shortness of breath.   amLODipine 10 MG tablet Commonly known as: NORVASC Take 1 tablet (10 mg total) by mouth daily.   Aspirin Low Dose 81 MG tablet Generic drug: aspirin EC Take 1 tablet (81 mg total) by mouth daily. Swallow whole.   atorvastatin 80 MG tablet Commonly known as: LIPITOR Take 1 tablet (80 mg total) by mouth daily.   budesonide-formoterol 160-4.5 MCG/ACT inhaler Commonly known as: SYMBICORT Inhale 2 puffs into the lungs every morning.   cetirizine 10 MG tablet Commonly known as: ZYRTEC Take 10 mg by mouth daily as needed for allergies.   clopidogrel 75 MG tablet Commonly known as: PLAVIX Take 1 tablet (75 mg total) by mouth daily.   ergocalciferol 1.25 MG (50000 UT) capsule Commonly known as: VITAMIN D2 Take 1 capsule (50,000 Units total) by mouth once a week. Wednesday   gabapentin 100 MG capsule Commonly known as: NEURONTIN Take 2 capsules (200 mg total) by mouth 2 (two) times daily. What changed:  how much to take when to take this   hydrALAZINE 50 MG tablet Commonly known as: APRESOLINE Take 1 tablet (50 mg total) by mouth 3 (three) times daily.   hydrOXYzine 50 MG  tablet Commonly known as: ATARAX Take 50 mg by mouth at bedtime.   isosorbide mononitrate 30 MG 24 hr tablet Commonly known as: IMDUR Take 0.5 tablets (15 mg total) by mouth daily.   melatonin 5 MG Tabs Take 1 tablet (5 mg total) by mouth at bedtime as needed (sleep).   mirtazapine 7.5 MG tablet Commonly known as: REMERON Take 1 tablet (7.5 mg total) by mouth at bedtime.   nebivolol 10 MG tablet Commonly known as: BYSTOLIC Take 1 tablet (10 mg total) by mouth daily.   nitroGLYCERIN 0.4 MG SL tablet Commonly known as: NITROSTAT Place 1 tablet (0.4 mg total) under the tongue every 5 (five) minutes as needed for chest pain.   pantoprazole 40 MG tablet Commonly known as: PROTONIX Take 1 tablet (40  mg total) by mouth daily at 12 noon.   potassium chloride 10 MEQ tablet Commonly known as: KLOR-CON Take 1 tablet (10 mEq total) by mouth daily.   torsemide 20 MG tablet Commonly known as: DEMADEX Take 2 tablets (40 mg total) by mouth daily.   Tradjenta 5 MG Tabs tablet Generic drug: linagliptin Take 1 tablet (5 mg total) by mouth daily.         ALLERGIES: Allergies  Allergen Reactions   Penicillins Shortness Of Breath and Other (See Comments)    Caused yeast infection Has patient had a PCN reaction causing immediate rash, facial/tongue/throat swelling, SOB or lightheadedness with hypotension: Yes Has patient had a PCN reaction causing severe rash involving mucus membranes or skin necrosis: No Has patient had a PCN reaction that required hospitalization pt was in the hospital at the time of the reaction Has patient had a PCN reaction occurring within the last 10 years: No If all of the above answers are "NO", then may proceed with Cephalosp   Ultram [Tramadol] Other (See Comments)    Made her tongue raw     REVIEW OF SYSTEMS: A comprehensive ROS was conducted with the patient and is negative except as per HPI and below:  ROS    OBJECTIVE:   VITAL SIGNS: There  were no vitals taken for this visit.   PHYSICAL EXAM:  General: Pt appears well and is in NAD  Neck: General: Supple without adenopathy or carotid bruits. Thyroid: Thyroid size normal.  No goiter or nodules appreciated.   Lungs: Clear with good BS bilat   Heart: RRR   Abdomen:  soft, nontender  Extremities:  Lower extremities - No pretibial edema.   Skin:  No rash noted.  Neuro: MS is good with appropriate affect, pt is alert and Ox3    DM foot exam:    DATA REVIEWED:  Lab Results  Component Value Date   HGBA1C 6.2 (H) 11/01/2022   HGBA1C 6.5 (H) 05/09/2022   HGBA1C 6.4 (H) 12/30/2021    Latest Reference Range & Units 01/07/23 14:49  Sodium 135 - 145 mmol/L 135  Potassium 3.5 - 5.1 mmol/L 4.4  Chloride 98 - 111 mmol/L 107  CO2 22 - 32 mmol/L 19 (L)  Glucose 70 - 99 mg/dL 387 (H)  BUN 6 - 20 mg/dL 32 (H)  Creatinine 5.64 - 1.00 mg/dL 3.32 (H)  Calcium 8.9 - 10.3 mg/dL 8.7 (L)  Anion gap 5 - 15  9  GFR, Estimated >60 mL/min 14 (L)  (L): Data is abnormally low (H): Data is abnormally high   ASSESSMENT / PLAN / RECOMMENDATIONS:   1) Type 2 Diabetes Mellitus, ***controlled, With neuropathic and CKD IV complications - Most recent A1c of *** %. Goal A1c < 7.0 %.    Plan: GENERAL: ***  MEDICATIONS: ***  EDUCATION / INSTRUCTIONS: BG monitoring instructions: Patient is instructed to check her blood sugars *** times a day, ***. Call [facility] if: BG persistently < 70  I reviewed the Rule of 15 for the treatment of hypoglycemia in detail with the patient. Literature supplied.   2) Diabetic complications:  Eye: Does *** have known diabetic retinopathy.  Neuro/ Feet: Does *** have known diabetic peripheral neuropathy. Renal: Patient does  have known baseline CKD.  Follows with nephrology  3) Lipids: Patient is *** on a statin.        Signed electronically by: Lyndle Herrlich, MD  [facility]  [facility]  [address] [place], [place] [address] Phone: [phone] FAX: [phone]   CC: Courtney Paris, NP [address] [place] [address] Phone: [phone]  Fax: [phone]    Return to Endocrinology clinic as below: Future Appointments  Date Time Provider Department Center  01/11/2023  9:50 AM Jeffrie Stander, Konrad Dolores, MD [facility] None  01/13/2023  8:00 PM Quintella Reichert, MD [facility]  01/29/2023  3:00 PM [facility] PA/NP [facility] None

## 2023-01-13 ENCOUNTER — Ambulatory Visit (HOSPITAL_BASED_OUTPATIENT_CLINIC_OR_DEPARTMENT_OTHER): Payer: 59 | Attending: Internal Medicine | Admitting: Cardiology

## 2023-01-28 ENCOUNTER — Telehealth (HOSPITAL_COMMUNITY): Payer: Self-pay

## 2023-01-28 NOTE — Telephone Encounter (Signed)
Called and left patient a detailed message to confirm/remind patient of their appointment at the [facility] Clinic on 01/29/23.   Patient reminded to bring all medications and/or complete list.  Confirmed patient has transportation. Gave directions, instructed to utilize valet parking.  Confirmed appointment prior to ending call.

## 2023-01-29 ENCOUNTER — Encounter (HOSPITAL_COMMUNITY): Payer: 59

## 2023-03-04 ENCOUNTER — Other Ambulatory Visit: Payer: Self-pay | Admitting: Internal Medicine

## 2023-03-05 ENCOUNTER — Other Ambulatory Visit (HOSPITAL_COMMUNITY): Payer: Self-pay | Admitting: Cardiology

## 2023-03-05 ENCOUNTER — Other Ambulatory Visit: Payer: Self-pay | Admitting: Student

## 2023-03-05 LAB — CBC
HCT: 28.4 % — ABNORMAL LOW (ref 35.0–45.0)
Hemoglobin: 9.1 g/dL — ABNORMAL LOW (ref 11.7–15.5)
MCH: 29.3 pg (ref 27.0–33.0)
MCHC: 32 g/dL (ref 32.0–36.0)
MCV: 91.3 fL (ref 80.0–100.0)
MPV: 12.9 fL — ABNORMAL HIGH (ref 7.5–12.5)
Platelets: 200 10*3/uL (ref 140–400)
RBC: 3.11 10*6/uL — ABNORMAL LOW (ref 3.80–5.10)
RDW: 13.4 % (ref 11.0–15.0)
WBC: 5.3 10*3/uL (ref 3.8–10.8)

## 2023-03-05 LAB — COMPLETE METABOLIC PANEL WITH GFR
AG Ratio: 1.1 (calc) (ref 1.0–2.5)
ALT: 9 U/L (ref 6–29)
AST: 16 U/L (ref 10–35)
Albumin: 3.1 g/dL — ABNORMAL LOW (ref 3.6–5.1)
Alkaline phosphatase (APISO): 137 U/L (ref 37–153)
BUN/Creatinine Ratio: 11 (calc) (ref 6–22)
BUN: 40 mg/dL — ABNORMAL HIGH (ref 7–25)
CO2: 18 mmol/L — ABNORMAL LOW (ref 20–32)
Calcium: 8.3 mg/dL — ABNORMAL LOW (ref 8.6–10.4)
Chloride: 109 mmol/L (ref 98–110)
Creat: 3.54 mg/dL — ABNORMAL HIGH (ref 0.50–1.05)
Globulin: 2.8 g/dL (calc) (ref 1.9–3.7)
Glucose, Bld: 132 mg/dL — ABNORMAL HIGH (ref 65–99)
Potassium: 4.3 mmol/L (ref 3.5–5.3)
Sodium: 138 mmol/L (ref 135–146)
Total Bilirubin: 0.5 mg/dL (ref 0.2–1.2)
Total Protein: 5.9 g/dL — ABNORMAL LOW (ref 6.1–8.1)
eGFR: 14 mL/min/{1.73_m2} — ABNORMAL LOW (ref >=60)

## 2023-03-05 LAB — LIPID PANEL
Cholesterol: 222 mg/dL — ABNORMAL HIGH (ref <200)
HDL: 97 mg/dL (ref >=50)
LDL Cholesterol (Calc): 104 mg/dL (calc) — ABNORMAL HIGH
Non-HDL Cholesterol (Calc): 125 mg/dL (calc) (ref <130)
Total CHOL/HDL Ratio: 2.3 (calc) (ref <5.0)
Triglycerides: 109 mg/dL (ref <150)

## 2023-03-05 LAB — TSH: TSH: 3.72 mIU/L (ref 0.40–4.50)

## 2023-03-05 LAB — VITAMIN D 25 HYDROXY (VIT D DEFICIENCY, FRACTURES): Vit D, 25-Hydroxy: 41 ng/mL (ref 30–100)

## 2023-03-07 ENCOUNTER — Emergency Department (HOSPITAL_COMMUNITY): Payer: 59

## 2023-03-07 ENCOUNTER — Inpatient Hospital Stay (HOSPITAL_COMMUNITY)
Admission: EM | Admit: 2023-03-07 | Discharge: 2023-03-11 | DRG: 291 | Disposition: A | Discharge status: Home / Self Care | Payer: 59 | Attending: Internal Medicine | Admitting: Internal Medicine

## 2023-03-07 ENCOUNTER — Encounter (HOSPITAL_COMMUNITY): Payer: Self-pay

## 2023-03-07 ENCOUNTER — Other Ambulatory Visit: Payer: Self-pay

## 2023-03-07 DIAGNOSIS — Z79899 Other long term (current) drug therapy: Secondary | ICD-10-CM

## 2023-03-07 DIAGNOSIS — Z880 Allergy status to penicillin: Secondary | ICD-10-CM

## 2023-03-07 DIAGNOSIS — Z8249 Family history of ischemic heart disease and other diseases of the circulatory system: Secondary | ICD-10-CM

## 2023-03-07 DIAGNOSIS — Z7951 Long term (current) use of inhaled steroids: Secondary | ICD-10-CM

## 2023-03-07 DIAGNOSIS — I5043 Acute on chronic combined systolic (congestive) and diastolic (congestive) heart failure: Secondary | ICD-10-CM | POA: Diagnosis present

## 2023-03-07 DIAGNOSIS — J449 Chronic obstructive pulmonary disease, unspecified: Secondary | ICD-10-CM | POA: Diagnosis present

## 2023-03-07 DIAGNOSIS — F32A Depression, unspecified: Secondary | ICD-10-CM | POA: Diagnosis present

## 2023-03-07 DIAGNOSIS — Z7902 Long term (current) use of antithrombotics/antiplatelets: Secondary | ICD-10-CM

## 2023-03-07 DIAGNOSIS — E119 Type 2 diabetes mellitus without complications: Secondary | ICD-10-CM

## 2023-03-07 DIAGNOSIS — Z841 Family history of disorders of kidney and ureter: Secondary | ICD-10-CM

## 2023-03-07 DIAGNOSIS — Z888 Allergy status to other drugs, medicaments and biological substances status: Secondary | ICD-10-CM

## 2023-03-07 DIAGNOSIS — E8779 Other fluid overload: Secondary | ICD-10-CM | POA: Diagnosis not present

## 2023-03-07 DIAGNOSIS — I509 Heart failure, unspecified: Principal | ICD-10-CM

## 2023-03-07 DIAGNOSIS — G8929 Other chronic pain: Secondary | ICD-10-CM | POA: Diagnosis present

## 2023-03-07 DIAGNOSIS — M549 Dorsalgia, unspecified: Secondary | ICD-10-CM | POA: Diagnosis present

## 2023-03-07 DIAGNOSIS — E8770 Fluid overload, unspecified: Secondary | ICD-10-CM | POA: Diagnosis not present

## 2023-03-07 DIAGNOSIS — F1410 Cocaine abuse, uncomplicated: Secondary | ICD-10-CM | POA: Diagnosis present

## 2023-03-07 DIAGNOSIS — F419 Anxiety disorder, unspecified: Secondary | ICD-10-CM | POA: Diagnosis present

## 2023-03-07 DIAGNOSIS — E1122 Type 2 diabetes mellitus with diabetic chronic kidney disease: Secondary | ICD-10-CM | POA: Diagnosis present

## 2023-03-07 DIAGNOSIS — I10 Essential (primary) hypertension: Secondary | ICD-10-CM | POA: Diagnosis present

## 2023-03-07 DIAGNOSIS — Z7984 Long term (current) use of oral hypoglycemic drugs: Secondary | ICD-10-CM

## 2023-03-07 DIAGNOSIS — I5033 Acute on chronic diastolic (congestive) heart failure: Secondary | ICD-10-CM | POA: Diagnosis not present

## 2023-03-07 DIAGNOSIS — E1142 Type 2 diabetes mellitus with diabetic polyneuropathy: Secondary | ICD-10-CM | POA: Diagnosis present

## 2023-03-07 DIAGNOSIS — N184 Chronic kidney disease, stage 4 (severe): Secondary | ICD-10-CM | POA: Diagnosis present

## 2023-03-07 DIAGNOSIS — Z833 Family history of diabetes mellitus: Secondary | ICD-10-CM

## 2023-03-07 DIAGNOSIS — K219 Gastro-esophageal reflux disease without esophagitis: Secondary | ICD-10-CM | POA: Diagnosis present

## 2023-03-07 DIAGNOSIS — Z7982 Long term (current) use of aspirin: Secondary | ICD-10-CM

## 2023-03-07 DIAGNOSIS — Z9851 Tubal ligation status: Secondary | ICD-10-CM

## 2023-03-07 DIAGNOSIS — Z794 Long term (current) use of insulin: Secondary | ICD-10-CM

## 2023-03-07 DIAGNOSIS — Z825 Family history of asthma and other chronic lower respiratory diseases: Secondary | ICD-10-CM

## 2023-03-07 DIAGNOSIS — I130 Hypertensive heart and chronic kidney disease with heart failure and stage 1 through stage 4 chronic kidney disease, or unspecified chronic kidney disease: Secondary | ICD-10-CM | POA: Diagnosis not present

## 2023-03-07 DIAGNOSIS — I5023 Acute on chronic systolic (congestive) heart failure: Secondary | ICD-10-CM | POA: Diagnosis present

## 2023-03-07 DIAGNOSIS — E1169 Type 2 diabetes mellitus with other specified complication: Secondary | ICD-10-CM

## 2023-03-07 DIAGNOSIS — N185 Chronic kidney disease, stage 5: Secondary | ICD-10-CM

## 2023-03-07 DIAGNOSIS — F17210 Nicotine dependence, cigarettes, uncomplicated: Secondary | ICD-10-CM | POA: Diagnosis present

## 2023-03-07 DIAGNOSIS — G20A1 Parkinson's disease without dyskinesia, without mention of fluctuations: Secondary | ICD-10-CM | POA: Diagnosis present

## 2023-03-07 DIAGNOSIS — I252 Old myocardial infarction: Secondary | ICD-10-CM

## 2023-03-07 DIAGNOSIS — Z91119 Patient's noncompliance with dietary regimen due to unspecified reason: Secondary | ICD-10-CM

## 2023-03-07 DIAGNOSIS — E1165 Type 2 diabetes mellitus with hyperglycemia: Secondary | ICD-10-CM | POA: Diagnosis present

## 2023-03-07 LAB — CBC WITH DIFFERENTIAL/PLATELET
Abs Immature Granulocytes: 0.02 10*3/uL (ref 0.00–0.07)
Basophils Absolute: 0 10*3/uL (ref 0.0–0.1)
Basophils Relative: 0 %
Eosinophils Absolute: 0.1 10*3/uL (ref 0.0–0.5)
Eosinophils Relative: 2 %
HCT: 27.5 % — ABNORMAL LOW (ref 36.0–46.0)
Hemoglobin: 9 g/dL — ABNORMAL LOW (ref 12.0–15.0)
Immature Granulocytes: 0 %
Lymphocytes Relative: 20 %
Lymphs Abs: 1.2 10*3/uL (ref 0.7–4.0)
MCH: 29.8 pg (ref 26.0–34.0)
MCHC: 32.7 g/dL (ref 30.0–36.0)
MCV: 91.1 fL (ref 80.0–100.0)
Monocytes Absolute: 0.8 10*3/uL (ref 0.1–1.0)
Monocytes Relative: 13 %
Neutro Abs: 3.9 10*3/uL (ref 1.7–7.7)
Neutrophils Relative %: 65 %
Platelets: 194 10*3/uL (ref 150–400)
RBC: 3.02 MIL/uL — ABNORMAL LOW (ref 3.87–5.11)
RDW: 14.5 % (ref 11.5–15.5)
WBC: 6.1 10*3/uL (ref 4.0–10.5)
nRBC: 0 % (ref 0.0–0.2)

## 2023-03-07 LAB — CBG MONITORING, ED: Glucose-Capillary: 134 mg/dL — ABNORMAL HIGH (ref 70–99)

## 2023-03-07 LAB — BRAIN NATRIURETIC PEPTIDE: B Natriuretic Peptide: 2525.3 pg/mL — ABNORMAL HIGH (ref 0.0–100.0)

## 2023-03-07 LAB — BASIC METABOLIC PANEL
Anion gap: 15 (ref 5–15)
BUN: 39 mg/dL — ABNORMAL HIGH (ref 6–20)
CO2: 16 mmol/L — ABNORMAL LOW (ref 22–32)
Calcium: 8 mg/dL — ABNORMAL LOW (ref 8.9–10.3)
Chloride: 104 mmol/L (ref 98–111)
Creatinine, Ser: 3.48 mg/dL — ABNORMAL HIGH (ref 0.44–1.00)
GFR, Estimated: 14 mL/min — ABNORMAL LOW (ref >60)
Glucose, Bld: 148 mg/dL — ABNORMAL HIGH (ref 70–99)
Potassium: 3.6 mmol/L (ref 3.5–5.1)
Sodium: 135 mmol/L (ref 135–145)

## 2023-03-07 MED ORDER — INSULIN ASPART 100 UNIT/ML IJ SOLN: 0.0000 [IU] | Freq: Every day | INTRAMUSCULAR | Status: DC

## 2023-03-07 MED ORDER — ACETAMINOPHEN 650 MG RE SUPP: 650.0000 mg | Freq: Four times a day (QID) | RECTAL | Status: DC | PRN

## 2023-03-07 MED ORDER — INSULIN ASPART 100 UNIT/ML IJ SOLN
0.0000 [IU] | Freq: Three times a day (TID) | INTRAMUSCULAR | Status: DC
  Administered 2023-03-09 – 2023-03-10 (×3): 2 [IU] via SUBCUTANEOUS

## 2023-03-07 MED ORDER — FUROSEMIDE 10 MG/ML IJ SOLN
40.0000 mg | Freq: Once | INTRAMUSCULAR | Status: AC
  Administered 2023-03-07: 40 mg via INTRAVENOUS
  Filled 2023-03-07: qty 4

## 2023-03-07 MED ORDER — HEPARIN SODIUM (PORCINE) 5000 UNIT/ML IJ SOLN
5000.0000 [IU] | Freq: Three times a day (TID) | INTRAMUSCULAR | Status: DC
  Administered 2023-03-07 – 2023-03-11 (×12): 5000 [IU] via SUBCUTANEOUS
  Filled 2023-03-07 (×11): qty 1

## 2023-03-07 MED ORDER — ACETAMINOPHEN 325 MG PO TABS
650.0000 mg | ORAL_TABLET | Freq: Four times a day (QID) | ORAL | Status: DC | PRN
  Administered 2023-03-07 – 2023-03-10 (×5): 650 mg via ORAL
  Filled 2023-03-07 (×7): qty 2

## 2023-03-07 NOTE — Assessment & Plan Note (Signed)
Long term use it looks like based on prior UDSs.  Still using as recently as March of this year.  Looks like using all the way back in 2011 / earlier.

## 2023-03-07 NOTE — Assessment & Plan Note (Signed)
Sensitive SSI AC/HS

## 2023-03-07 NOTE — Assessment & Plan Note (Addendum)
Likely primarily nephrogenic volume overload in this patient with CKD 4-5 at baseline. H/o Grade 1 DD with preserved EF DDx includes nephrotic syndrome 2d echo Tele monitor Trying "gentle" diuresis first with 40mg  IV lasix x1 now given that she doesn't really have significant respiratory symptoms at this stage. Plan to escalate lasix if response inadequate as I suspect it may be given advanced CKD. Strict intake and output Check LFT (for albumin level) Check UA

## 2023-03-07 NOTE — ED Triage Notes (Signed)
Pt sent by PCP for kidney blood work showed her "kidneys are about to shut down and she needs to come here."

## 2023-03-07 NOTE — Assessment & Plan Note (Signed)
Cont home BP meds once med rec completed ?

## 2023-03-07 NOTE — ED Provider Notes (Signed)
[place] [facility]ERGENCY DEPARTMENT AT [facility] Provider Note   CSN: [id number] Arrival date & time: 03/07/23  1450     History  Chief Complaint  Patient presents with   Abnormal Lab    Carol [name] is a 62 y.o. female history of COPD, hypertension, diabetes, CKD stage IV, CHF, cocaine use disorder presented for abnormal lab.  Patient states that she went to her primary care provider yesterday and got a call today saying that her kidneys were shutting down.  Patient recently transferred back to this provider after being gone for 7 years.  Patient states that over the past week her legs have become progressively more swollen and that patient feels short of breath when walking.  Patient states she has been on Lasix however is unable to take them due to her kidney function.  Patient does not currently see nephrology.  Patient denies chest pain, abdominal pain, abdominal swelling, fevers, skin color changes  Home Medications Prior to Admission medications   Medication Sig Start Date End Date Taking? Authorizing Provider  albuterol (PROVENTIL HFA;VENTOLIN HFA) 108 (90 Base) MCG/ACT inhaler Inhale 2 puffs into the lungs every 4 (four) hours as needed for wheezing or shortness of breath.    [provider]  amLODipine (NORVASC) 10 MG tablet Take 1 tablet (10 mg total) by mouth daily. 11/01/22   Jacklynn Ganong, FNP  aspirin EC 81 MG tablet Take 1 tablet (81 mg total) by mouth daily. Swallow whole. 05/11/22   Quincy Simmonds, MD  atorvastatin (LIPITOR) 80 MG tablet Take 1 tablet (80 mg total) by mouth daily. 05/11/22   Quincy Simmonds, MD  budesonide-formoterol [facility]) 160-4.5 MCG/ACT inhaler Inhale 2 puffs into the lungs every morning. 11/09/21   [provider]  cetirizine (ZYRTEC) 10 MG tablet Take 10 mg by mouth daily as needed for allergies.    [provider]  clopidogrel (PLAVIX) 75 MG tablet Take 1 tablet (75 mg total) by mouth daily.  05/11/22   Quincy Simmonds, MD  ergocalciferol (VITAMIN D2) 1.25 MG (50000 UT) capsule Take 1 capsule (50,000 Units total) by mouth once a week. Wednesday 05/11/22   Quincy Simmonds, MD  gabapentin (NEURONTIN) 100 MG capsule Take 2 capsules (200 mg total) by mouth 2 (two) times daily. Patient taking differently: Take 100 mg by mouth at bedtime. 05/11/22   Quincy Simmonds, MD  hydrALAZINE (APRESOLINE) 50 MG tablet Take 1 tablet (50 mg total) by mouth 3 (three) times daily. 01/07/23 01/07/24  Laurey Morale, MD  hydrOXYzine (ATARAX) 50 MG tablet Take 50 mg by mouth at bedtime. 07/02/22   [provider]  isosorbide mononitrate (IMDUR) 30 MG 24 hr tablet Take 0.5 tablets (15 mg total) by mouth daily. 01/07/23   Laurey Morale, MD  linagliptin (TRADJENTA) 5 MG TABS tablet Take 1 tablet (5 mg total) by mouth daily. 11/08/22   Ghimire, Werner Lean, MD  melatonin 5 MG TABS Take 1 tablet (5 mg total) by mouth at bedtime as needed (sleep). 11/07/22   Ghimire, Werner Lean, MD  mirtazapine (REMERON) 7.5 MG tablet Take 1 tablet (7.5 mg total) by mouth at bedtime. 11/07/22   Ghimire, Werner Lean, MD  nebivolol (BYSTOLIC) 10 MG tablet Take 1 tablet (10 mg total) by mouth daily. 01/07/23   Bensimhon, Bevelyn Buckles, MD  nitroGLYCERIN (NITROSTAT) 0.4 MG SL tablet Place 1 tablet (0.4 mg total) under the tongue every 5 (five) minutes as needed for chest pain. 05/11/22 11/01/23  Quincy Simmonds,  MD  pantoprazole (PROTONIX) 40 MG tablet Take 1 tablet (40 mg total) by mouth daily at 12 noon. 05/11/22   Quincy Simmonds, MD  potassium chloride (KLOR-CON) 10 MEQ tablet Take 1 tablet (10 mEq total) by mouth daily. 01/07/23 04/07/23  Bensimhon, Bevelyn Buckles, MD  torsemide (DEMADEX) 20 MG tablet Take 2 tablets (40 mg total) by mouth daily. 01/07/23   Bensimhon, Bevelyn Buckles, MD      Allergies    Penicillins and Ultram [tramadol]    Review of Systems   Review of Systems See HPI Physical Exam Updated Vital Signs BP 132/81   Pulse 72   Temp 98 F  (36.7 C) (Oral)   Resp 20   Ht 5\' 4"  (1.626 m)   Wt 82.6 kg   SpO2 100%   BMI 31.26 kg/m  Physical Exam Vitals reviewed.  Constitutional:      General: She is not in acute distress. HENT:     Head: Normocephalic and atraumatic.  Eyes:     Extraocular Movements: Extraocular movements intact.     Conjunctiva/sclera: Conjunctivae normal.     Pupils: Pupils are equal, round, and reactive to light.  Cardiovascular:     Rate and Rhythm: Normal rate and regular rhythm.     Pulses: Normal pulses.     Heart sounds: Normal heart sounds.     Comments: 2+ bilateral radial/dorsalis pedis pulses with regular rate Pulmonary:     Effort: Pulmonary effort is normal. No respiratory distress.     Breath sounds: Normal breath sounds.  Abdominal:     General: There is no distension.     Palpations: Abdomen is soft.     Tenderness: There is no abdominal tenderness. There is no guarding or rebound.  Musculoskeletal:        General: Normal range of motion.     Cervical back: Normal range of motion and neck supple.     Comments: 5 out of 5 bilateral grip/leg extension strength 3+ bilateral pitting edema  Skin:    General: Skin is warm and dry.     Capillary Refill: Capillary refill takes less than 2 seconds.  Neurological:     General: No focal deficit present.     Mental Status: She is alert and oriented to person, place, and time.     Comments: Sensation intact in all 4 limbs  Psychiatric:        Mood and Affect: Mood normal.     ED Results / Procedures / Treatments   Labs (all labs ordered are listed, but only abnormal results are displayed) Labs Reviewed  BASIC METABOLIC PANEL - Abnormal; Notable for the following components:      Result Value   CO2 16 (*)    Glucose, Bld 148 (*)    BUN 39 (*)    Creatinine, Ser 3.48 (*)    Calcium 8.0 (*)    GFR, Estimated 14 (*)    All other components within normal limits  CBC WITH DIFFERENTIAL/PLATELET - Abnormal; Notable for the following  components:   RBC 3.02 (*)    Hemoglobin 9.0 (*)    HCT 27.5 (*)    All other components within normal limits  BRAIN NATRIURETIC PEPTIDE - Abnormal; Notable for the following components:   B Natriuretic Peptide 2,525.3 (*)    All other components within normal limits    EKG None  Radiology DG Chest Port 1 View  Result Date: 03/07/2023 CLINICAL DATA:  Fluid overload EXAM: PORTABLE CHEST  1 VIEW COMPARISON:  07/21/2022 FINDINGS: The lungs are symmetrically well inflated. There is central pulmonary vascular congestion with pulmonary vascular redistribution of the lung apices in keeping with changes of early cardiogenic failure or volume overload. Bibasilar somewhat asymmetric peribronchial infiltrates have developed which may reflect changes of edema or airway inflammation. No pneumothorax or pleural effusion. Stable cardiomegaly. No acute bone abnormality. IMPRESSION: 1. Cardiomegaly with early cardiogenic failure or volume overload. 2. Bibasilar peribronchial infiltrates which may reflect changes of edema or airway inflammation. Electronically Signed   By: Helyn Numbers M.D.   On: 03/07/2023 19:40    Procedures Procedures    Medications Ordered in ED Medications  furosemide (LASIX) injection 40 mg (has no administration in time range)    ED Course/ Medical Decision Making/ A&P                             Medical Decision Making Amount and/or Complexity of Data Reviewed Labs: ordered. Radiology: ordered.   Sheral Flow 62 y.o. presented today for abnormal labs. Working DDx that I considered at this time includes, but not limited to, CKD, ESRD, fluid overload, electrolyte abnormalities, pleural effusions, CHF exacerbation.  R/o DDx: Electrolyte abnormalities, pleural effusions: These are considered less likely due to history of present illness and physical exam findings  Review of prior external notes: 11/07/2022 discharge summary  Unique Tests and My  Interpretation:  CBC: Unremarkable BMP: BUN 39, creatinine 3.48, GFR 14 BNP: 2525.3 Chest x-ray: Cardiomegaly with early cardiogenic failure or volume overload EKG: Sinus 73 bpm, no ST elevations or depressions noted  Discussion with Independent Historian:  Husband  Discussion of Management of Tests:  Julian Reil, MD Hospitalist  Risk: High: hospitalization or escalation of hospital-level care  Risk Stratification Score: None  Plan: On exam patient was in no acute distress however was hypertensive at 191/90.  Patient did have 3+ pitting edema bilaterally but was neurovascular intact.  Patient's lungs are clear to auscultation however patient does endorse dyspnea on exertion so chest x-ray will be ordered to assess for pleural effusions.  Patient's kidney function appears to be the same over the past few readings and with no significant change.  Patient does not see nephrology and pending BMP and chest x-ray nephrology will be consulted as patient most likely will need dialysis in the future.  Patient stable at this time.  Patient's BMP came back elevated at 2525.3 which is nearly doubled last reading.  In conjunction with her decreased kidney function patient may also be having CHF exacerbation as well contributing to her fluid overload.  Patient still makes urine and at this time there are no indications for emergent dialysis.  Hospitalist will be consulted for admission due to fluid overload with CHF exacerbation and poor kidney function.  Patient stable at this time.  I spoke to the hospitalist and he recommended starting patient on Lasix to see if that will help her fluid overload otherwise patient may need dialysis.  Patient was accepted for admission and is stable for admission.        Final Clinical Impression(s) / ED Diagnoses Final diagnoses:  Acute on chronic congestive heart failure, unspecified heart failure type (HCC)  Stage 5 chronic kidney disease not on chronic dialysis  [facility])    Rx / DC Orders ED Discharge Orders     None         Netta Corrigan, Cordelia Poche 03/07/23 2155  Arby Barrette, MD 03/08/23 1125

## 2023-03-07 NOTE — H&P (Signed)
History and Physical    Patient: Carol [name] KGU:[id number] DOB: Jul 25, 1961 DOA: 03/07/2023 DOS: the patient was seen and examined on 03/07/2023 PCP: System, Provider Not In  Patient coming from: Home  Chief Complaint:  Chief Complaint  Patient presents with   Abnormal Lab   HPI: Carol [name] is a 62 y.o. female with medical history significant of COPD, hypertension, diabetes, CKD stage IV, CHF, cocaine use disorder.  Pt went to PCP yesterday, recently transferred back to this provider after being gone for 7 years.  Labs drawn.  Got call today saying her kidneys shutting down.  Patient states that over the past week her legs have become progressively more swollen and that patient feels short of breath when walking. Patient states she has been on Lasix however is unable to take them due to her kidney function. Patient does not currently see nephrology.   On chart review: Turns out pt just has baseline CKD stage 4-5 which is presumably unknown to the PCP whom she just switched back to.  Renal function today (while terrible) does appear c/w her recent baseline (see March 2024 labs in Epic).  In ED: BNP elevated  Pt denies CP, abd pain, abd swelling.  Review of Systems: As mentioned in the history of present illness. All other systems reviewed and are negative. 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   Chronic back pain    Depression    Diabetes mellitus    GERD (gastroesophageal reflux disease)    Heart murmur    Hypertension    NSTEMI (non-ST elevated myocardial infarction) (HCC) 12/20/2021   Palpitations    Parkinson's disease    Past Surgical History:  Procedure Laterality Date   BALLOON DILATION N/A 11/06/2022   Procedure: BALLOON DILATION;  Surgeon: Napoleon Form, MD;  Location: [facility];  Service: Gastroenterology;  Laterality: N/A;   CARPAL TUNNEL RELEASE     COLONOSCOPY WITH PROPOFOL N/A 11/06/2022   Procedure: COLONOSCOPY WITH PROPOFOL;  Surgeon: Napoleon Form, MD;  Location: [facility] ENDOS[facility];  Service: Gastroenterology;  Laterality: N/A;   ESOPHAGOGASTRODUODENOSCOPY (EGD) WITH PROPOFOL N/A 11/06/2022   Procedure: ESOPHAGOGASTRODUODENOSCOPY (EGD) WITH PROPOFOL;  Surgeon: Napoleon Form, MD;  Location: [facility] ENDOS[facility];  Service: Gastroenterology;  Laterality: N/A;   IR FLUORO GUIDE CV LINE RIGHT  01/26/2022   IR US GUIDE VASC ACCESS RIGHT  01/26/2022   LEFT HEART CATH AND CORONARY ANGIOGRAPHY N/A 12/20/2021   Procedure: LEFT HEART CATH AND CORONARY ANGIOGRAPHY;  Surgeon: Kathleene Hazel, MD;  Location: [facility] INVASIVE CV LAB;  Service: Cardiovascular;  Laterality: N/A;   OPEN REDUCTION INTERNAL FIXATION (ORIF) FINGER WITH RADIAL BONE GRAFT Right 09/12/2015   Procedure: RIGHT MIDDLE FINGER CLOSED REDUCTION AND PINNING;  Surgeon: Bradly Bienenstock, MD;  Location: [facility] OR;  Service: Orthopedics;  Laterality: Right;   TONSILLECTOMY     TUBAL LIGATION     Social History:  reports that she has been smoking cigarettes. She has a 0.8 pack-year smoking history. She has never used smokeless tobacco. She reports current alcohol use. She reports that she does not use drugs.  Allergies  Allergen Reactions   Penicillins Shortness Of Breath and Other (See Comments)    Caused yeast infection Has patient had a PCN reaction causing immediate rash, facial/tongue/throat swelling, SOB  or lightheadedness with hypotension: Yes Has patient had a PCN reaction causing severe rash involving mucus membranes or skin necrosis: No Has patient had a PCN reaction that required hospitalization pt was in the hospital at the time of the reaction Has patient had a PCN reaction occurring within the last 10 years: No If all of the above answers are "NO", then may proceed with Cephalosp    Ultram [Tramadol] Other (See Comments)    Made her tongue raw    Family History  Problem Relation Age of Onset   Diabetes Mother    Kidney disease Mother    Seizures Father    Hypertension Father    Stroke Father    Asthma Other     Prior to Admission medications   Medication Sig Start Date End Date Taking? Authorizing Provider  albuterol (PROVENTIL HFA;VENTOLIN HFA) 108 (90 Base) MCG/ACT inhaler Inhale 2 puffs into the lungs every 4 (four) hours as needed for wheezing or shortness of breath.    [provider]  amLODipine (NORVASC) 10 MG tablet Take 1 tablet (10 mg total) by mouth daily. 11/01/22   Jacklynn Ganong, FNP  aspirin EC 81 MG tablet Take 1 tablet (81 mg total) by mouth daily. Swallow whole. 05/11/22   Quincy Simmonds, MD  atorvastatin (LIPITOR) 80 MG tablet Take 1 tablet (80 mg total) by mouth daily. 05/11/22   Quincy Simmonds, MD  budesonide-formoterol [facility]) 160-4.5 MCG/ACT inhaler Inhale 2 puffs into the lungs every morning. 11/09/21   [provider]  cetirizine (ZYRTEC) 10 MG tablet Take 10 mg by mouth daily as needed for allergies.    [provider]  clopidogrel (PLAVIX) 75 MG tablet Take 1 tablet (75 mg total) by mouth daily. 05/11/22   Quincy Simmonds, MD  ergocalciferol (VITAMIN D2) 1.25 MG (50000 UT) capsule Take 1 capsule (50,000 Units total) by mouth once a week. Wednesday 05/11/22   Quincy Simmonds, MD  gabapentin (NEURONTIN) 100 MG capsule Take 2 capsules (200 mg total) by mouth 2 (two) times daily. Patient taking differently: Take 100 mg by mouth at bedtime. 05/11/22   Quincy Simmonds, MD  hydrALAZINE (APRESOLINE) 50 MG tablet Take 1 tablet (50 mg total) by mouth 3 (three) times daily. 01/07/23 01/07/24  Laurey Morale, MD  hydrOXYzine (ATARAX) 50 MG tablet Take 50 mg by mouth at bedtime. 07/02/22   [provider]  isosorbide mononitrate (IMDUR) 30 MG 24 hr tablet Take 0.5 tablets (15 mg total) by mouth daily. 01/07/23    Laurey Morale, MD  linagliptin (TRADJENTA) 5 MG TABS tablet Take 1 tablet (5 mg total) by mouth daily. 11/08/22   Ghimire, Werner Lean, MD  melatonin 5 MG TABS Take 1 tablet (5 mg total) by mouth at bedtime as needed (sleep). 11/07/22   Ghimire, Werner Lean, MD  mirtazapine (REMERON) 7.5 MG tablet Take 1 tablet (7.5 mg total) by mouth at bedtime. 11/07/22   Ghimire, Werner Lean, MD  nebivolol (BYSTOLIC) 10 MG tablet Take 1 tablet (10 mg total) by mouth daily. 01/07/23   Bensimhon, Bevelyn Buckles, MD  nitroGLYCERIN (NITROSTAT) 0.4 MG SL tablet Place 1 tablet (0.4 mg total) under the tongue every 5 (five) minutes as needed for chest pain. 05/11/22 11/01/23  Quincy Simmonds, MD  pantoprazole (PROTONIX) 40 MG tablet Take 1 tablet (40 mg total) by mouth daily at 12 noon. 05/11/22   Quincy Simmonds, MD  potassium chloride (KLOR-CON) 10 MEQ tablet Take 1 tablet (10 mEq total) by mouth daily.  01/07/23 04/07/23  Bensimhon, Bevelyn Buckles, MD  torsemide (DEMADEX) 20 MG tablet Take 2 tablets (40 mg total) by mouth daily. 01/07/23   Bensimhon, Bevelyn Buckles, MD    Physical Exam: Vitals:   03/07/23 1830 03/07/23 1900 03/07/23 2206 03/07/23 2207  BP: (!) 198/105 132/81 (!) 189/108   Pulse: 73 72 74   Resp: (!) 21 20 (!) 22   Temp:    98.5 F (36.9 C)  TempSrc:    Oral  SpO2: 100% 100% 95%   Weight:      Height:       Constitutional: NAD, calm, comfortable Respiratory: clear to auscultation bilaterally, no wheezing, no crackles. Normal respiratory effort. No accessory muscle use.  Cardiovascular: Regular rate and rhythm, no murmurs / rubs / gallops. 4+ BLE edema 2+ pedal pulses. No carotid bruits.  Abdomen: no tenderness, no masses palpated. No hepatosplenomegaly. Bowel sounds positive.  Neurologic: CN 2-12 grossly intact. Sensation intact, DTR normal. Strength 5/5 in all 4.  Psychiatric: Normal judgment and insight. Alert and oriented x 3. Normal mood.   Data Reviewed:    Labs on Admission: I have personally reviewed following  labs and imaging studies  CBC: Recent Labs  Lab 03/04/23 0000 03/07/23 1526  WBC 5.3 6.1  NEUTROABS  --  3.9  HGB 9.1* 9.0*  HCT 28.4* 27.5*  MCV 91.3 91.1  PLT 200 194   Basic Metabolic Panel: Recent Labs  Lab 03/04/23 0000 03/07/23 1526  NA 138 135  K 4.3 3.6  CL 109 104  CO2 18* 16*  GLUCOSE 132* 148*  BUN 40* 39*  CREATININE 3.54* 3.48*  CALCIUM 8.3* 8.0*   GFR: Estimated Creatinine Clearance: 17.9 mL/min (A) (by C-G formula based on SCr of 3.48 mg/dL (H)). Liver Function Tests: Recent Labs  Lab 03/04/23 0000  AST 16  ALT 9  BILITOT 0.5  PROT 5.9*   No results for input(s): "LIPASE", "AMYLASE" in the last 168 hours. No results for input(s): "AMMONIA" in the last 168 hours. Coagulation Profile: No results for input(s): "INR", "PROTIME" in the last 168 hours. Cardiac Enzymes: No results for input(s): "CKTOTAL", "CKMB", "CKMBINDEX", "TROPONINI" in the last 168 hours. BNP (last 3 results) No results for input(s): "PROBNP" in the last 8760 hours. HbA1C: No results for input(s): "HGBA1C" in the last 72 hours. CBG: No results for input(s): "GLUCAP" in the last 168 hours. Lipid Profile: No results for input(s): "CHOL", "HDL", "LDLCALC", "TRIG", "CHOLHDL", "LDLDIRECT" in the last 72 hours. Thyroid Function Tests: No results for input(s): "TSH", "T4TOTAL", "FREET4", "T3FREE", "THYROIDAB" in the last 72 hours. Anemia Panel: No results for input(s): "VITAMINB12", "FOLATE", "FERRITIN", "TIBC", "IRON", "RETICCTPCT" in the last 72 hours. Urine analysis:    Component Value Date/Time   COLORURINE YELLOW 11/01/2022 0345   APPEARANCEUR HAZY (A) 11/01/2022 0345   LABSPEC 1.004 (L) 11/01/2022 0345   PHURINE 7.0 11/01/2022 0345   GLUCOSEU NEGATIVE 11/01/2022 0345   HGBUR NEGATIVE 11/01/2022 0345   BILIRUBINUR NEGATIVE 11/01/2022 0345   KETONESUR NEGATIVE 11/01/2022 0345   PROTEINUR >=300 (A) 11/01/2022 0345   UROBILINOGEN 0.2 01/29/2014 2116   NITRITE NEGATIVE  11/01/2022 0345   LEUKOCYTESUR MODERATE (A) 11/01/2022 0345    Radiological Exams on Admission: DG Chest Port 1 View  Result Date: 03/07/2023 CLINICAL DATA:  Fluid overload EXAM: PORTABLE CHEST 1 VIEW COMPARISON:  07/21/2022 FINDINGS: The lungs are symmetrically well inflated. There is central pulmonary vascular congestion with pulmonary vascular redistribution of the lung apices in keeping with  changes of early cardiogenic failure or volume overload. Bibasilar somewhat asymmetric peribronchial infiltrates have developed which may reflect changes of edema or airway inflammation. No pneumothorax or pleural effusion. Stable cardiomegaly. No acute bone abnormality. IMPRESSION: 1. Cardiomegaly with early cardiogenic failure or volume overload. 2. Bibasilar peribronchial infiltrates which may reflect changes of edema or airway inflammation. Electronically Signed   By: Helyn Numbers M.D.   On: 03/07/2023 19:40    EKG: Independently reviewed.   Assessment and Plan: * Volume overload Likely primarily nephrogenic volume overload in this patient with CKD 4-5 at baseline. H/o Grade 1 DD with preserved EF DDx includes nephrotic syndrome 2d echo Tele monitor Trying "gentle" diuresis first with 40mg  IV lasix x1 now given that she doesn't really have significant respiratory symptoms at this stage. Plan to escalate lasix if response inadequate as I suspect it may be given advanced CKD. Strict intake and output Check LFT (for albumin level) Check UA  CKD (chronic kidney disease) stage 4, GFR 15-29 ml/min (HCC) Pt with what looks like CKD stage 4-5 at baseline now. Needs nephrology consult in AM and outpt follow up (assuming we can diurese her medically), seems she's not yet started seeing nephrology despite very advanced CKD. Repeat BMP in AM  Cocaine use disorder, mild, abuse (HCC) Long term use it looks like based on prior UDSs.  Still using as recently as March of this year.  Looks like using all  the way back in 2011 / earlier.  Essential hypertension Cont home BP meds once med rec completed.  Diabetes mellitus (HCC) Sensitive SSI AC/HS      Advance Care Planning:   Code Status: Full Code  Consults: None, please call nephrology for formal consult in AM (dont want pt to slip through cracks again given very advanced CKD)  Family Communication: Family at bedside  Severity of Illness: The appropriate patient status for this patient is OBSERVATION. Observation status is judged to be reasonable and necessary in order to provide the required intensity of service to ensure the patient's safety. The patient's presenting symptoms, physical exam findings, and initial radiographic and laboratory data in the context of their medical condition is felt to place them at decreased risk for further clinical deterioration. Furthermore, it is anticipated that the patient will be medically stable for discharge from the hospital within 2 midnights of admission.   Author: Hillary Bow., DO 03/07/2023 10:40 PM  For on call review [email or web address].

## 2023-03-07 NOTE — Assessment & Plan Note (Addendum)
Pt with what looks like CKD stage 4-5 at baseline now. Needs nephrology consult in AM and outpt follow up (assuming we can diurese her medically), seems she's not yet started seeing nephrology despite very advanced CKD. Repeat BMP in AM

## 2023-03-08 ENCOUNTER — Observation Stay (HOSPITAL_COMMUNITY): Payer: 59

## 2023-03-08 DIAGNOSIS — K219 Gastro-esophageal reflux disease without esophagitis: Secondary | ICD-10-CM | POA: Diagnosis present

## 2023-03-08 DIAGNOSIS — Z841 Family history of disorders of kidney and ureter: Secondary | ICD-10-CM | POA: Diagnosis not present

## 2023-03-08 DIAGNOSIS — N184 Chronic kidney disease, stage 4 (severe): Secondary | ICD-10-CM | POA: Diagnosis present

## 2023-03-08 DIAGNOSIS — E1122 Type 2 diabetes mellitus with diabetic chronic kidney disease: Secondary | ICD-10-CM | POA: Diagnosis present

## 2023-03-08 DIAGNOSIS — I5023 Acute on chronic systolic (congestive) heart failure: Secondary | ICD-10-CM | POA: Diagnosis not present

## 2023-03-08 DIAGNOSIS — I5031 Acute diastolic (congestive) heart failure: Secondary | ICD-10-CM | POA: Diagnosis not present

## 2023-03-08 DIAGNOSIS — I252 Old myocardial infarction: Secondary | ICD-10-CM | POA: Diagnosis not present

## 2023-03-08 DIAGNOSIS — M549 Dorsalgia, unspecified: Secondary | ICD-10-CM | POA: Diagnosis present

## 2023-03-08 DIAGNOSIS — Z825 Family history of asthma and other chronic lower respiratory diseases: Secondary | ICD-10-CM | POA: Diagnosis not present

## 2023-03-08 DIAGNOSIS — F1410 Cocaine abuse, uncomplicated: Secondary | ICD-10-CM | POA: Diagnosis present

## 2023-03-08 DIAGNOSIS — Z7984 Long term (current) use of oral hypoglycemic drugs: Secondary | ICD-10-CM | POA: Diagnosis not present

## 2023-03-08 DIAGNOSIS — F17210 Nicotine dependence, cigarettes, uncomplicated: Secondary | ICD-10-CM | POA: Diagnosis present

## 2023-03-08 DIAGNOSIS — Z7951 Long term (current) use of inhaled steroids: Secondary | ICD-10-CM | POA: Diagnosis not present

## 2023-03-08 DIAGNOSIS — I5043 Acute on chronic combined systolic (congestive) and diastolic (congestive) heart failure: Secondary | ICD-10-CM | POA: Diagnosis present

## 2023-03-08 DIAGNOSIS — E1165 Type 2 diabetes mellitus with hyperglycemia: Secondary | ICD-10-CM | POA: Diagnosis present

## 2023-03-08 DIAGNOSIS — F32A Depression, unspecified: Secondary | ICD-10-CM | POA: Diagnosis present

## 2023-03-08 DIAGNOSIS — Z833 Family history of diabetes mellitus: Secondary | ICD-10-CM | POA: Diagnosis not present

## 2023-03-08 DIAGNOSIS — E1142 Type 2 diabetes mellitus with diabetic polyneuropathy: Secondary | ICD-10-CM | POA: Diagnosis present

## 2023-03-08 DIAGNOSIS — E8770 Fluid overload, unspecified: Secondary | ICD-10-CM | POA: Diagnosis present

## 2023-03-08 DIAGNOSIS — G20A1 Parkinson's disease without dyskinesia, without mention of fluctuations: Secondary | ICD-10-CM | POA: Diagnosis present

## 2023-03-08 DIAGNOSIS — J449 Chronic obstructive pulmonary disease, unspecified: Secondary | ICD-10-CM | POA: Diagnosis present

## 2023-03-08 DIAGNOSIS — F419 Anxiety disorder, unspecified: Secondary | ICD-10-CM | POA: Diagnosis present

## 2023-03-08 DIAGNOSIS — G8929 Other chronic pain: Secondary | ICD-10-CM | POA: Diagnosis present

## 2023-03-08 DIAGNOSIS — Z7982 Long term (current) use of aspirin: Secondary | ICD-10-CM | POA: Diagnosis not present

## 2023-03-08 DIAGNOSIS — E8779 Other fluid overload: Secondary | ICD-10-CM | POA: Diagnosis not present

## 2023-03-08 DIAGNOSIS — I5033 Acute on chronic diastolic (congestive) heart failure: Secondary | ICD-10-CM | POA: Diagnosis not present

## 2023-03-08 DIAGNOSIS — Z8249 Family history of ischemic heart disease and other diseases of the circulatory system: Secondary | ICD-10-CM | POA: Diagnosis not present

## 2023-03-08 DIAGNOSIS — Z79899 Other long term (current) drug therapy: Secondary | ICD-10-CM | POA: Diagnosis not present

## 2023-03-08 DIAGNOSIS — I130 Hypertensive heart and chronic kidney disease with heart failure and stage 1 through stage 4 chronic kidney disease, or unspecified chronic kidney disease: Secondary | ICD-10-CM | POA: Diagnosis present

## 2023-03-08 LAB — PROTEIN / CREATININE RATIO, URINE
Creatinine, Urine: 49 mg/dL
Protein Creatinine Ratio: 7.76 mg/mg{Cre} — ABNORMAL HIGH (ref 0.00–0.15)
Total Protein, Urine: 380 mg/dL

## 2023-03-08 LAB — URINALYSIS, COMPLETE (UACMP) WITH MICROSCOPIC
Bilirubin Urine: NEGATIVE
Glucose, UA: NEGATIVE mg/dL
Hgb urine dipstick: NEGATIVE
Ketones, ur: NEGATIVE mg/dL
Nitrite: NEGATIVE
Protein, ur: >=300 mg/dL — AB
Specific Gravity, Urine: 1.007 (ref 1.005–1.030)
pH: 7 (ref 5.0–8.0)

## 2023-03-08 LAB — ECHOCARDIOGRAM COMPLETE
AR max vel: 1.71 cm2
AV Peak grad: 4.4 mmHg
Ao pk vel: 1.05 m/s
Area-P 1/2: 3.99 cm2
Height: 64 in
MV M vel: 4.63 m/s
MV Peak grad: 85.7 mmHg
S' Lateral: 3.3 cm
Weight: 2913.6 oz

## 2023-03-08 LAB — CBC
HCT: 26 % — ABNORMAL LOW (ref 36.0–46.0)
Hemoglobin: 8.4 g/dL — ABNORMAL LOW (ref 12.0–15.0)
MCH: 28.7 pg (ref 26.0–34.0)
MCHC: 32.3 g/dL (ref 30.0–36.0)
MCV: 88.7 fL (ref 80.0–100.0)
Platelets: 180 10*3/uL (ref 150–400)
RBC: 2.93 MIL/uL — ABNORMAL LOW (ref 3.87–5.11)
RDW: 14.6 % (ref 11.5–15.5)
WBC: 5.9 10*3/uL (ref 4.0–10.5)
nRBC: 0 % (ref 0.0–0.2)

## 2023-03-08 LAB — HEPATIC FUNCTION PANEL
ALT: 11 U/L (ref 0–44)
AST: 18 U/L (ref 15–41)
Albumin: 2 g/dL — ABNORMAL LOW (ref 3.5–5.0)
Alkaline Phosphatase: 128 U/L — ABNORMAL HIGH (ref 38–126)
Bilirubin, Direct: <0.1 mg/dL (ref 0.0–0.2)
Total Bilirubin: 0.2 mg/dL — ABNORMAL LOW (ref 0.3–1.2)
Total Protein: 5.8 g/dL — ABNORMAL LOW (ref 6.5–8.1)

## 2023-03-08 LAB — BASIC METABOLIC PANEL
Anion gap: 10 (ref 5–15)
BUN: 37 mg/dL — ABNORMAL HIGH (ref 6–20)
CO2: 18 mmol/L — ABNORMAL LOW (ref 22–32)
Calcium: 7.9 mg/dL — ABNORMAL LOW (ref 8.9–10.3)
Chloride: 108 mmol/L (ref 98–111)
Creatinine, Ser: 3.36 mg/dL — ABNORMAL HIGH (ref 0.44–1.00)
GFR, Estimated: 15 mL/min — ABNORMAL LOW (ref >60)
Glucose, Bld: 100 mg/dL — ABNORMAL HIGH (ref 70–99)
Potassium: 3.5 mmol/L (ref 3.5–5.1)
Sodium: 136 mmol/L (ref 135–145)

## 2023-03-08 LAB — GLUCOSE, CAPILLARY
Glucose-Capillary: 110 mg/dL — ABNORMAL HIGH (ref 70–99)
Glucose-Capillary: 111 mg/dL — ABNORMAL HIGH (ref 70–99)
Glucose-Capillary: 109 mg/dL — ABNORMAL HIGH (ref 70–99)

## 2023-03-08 LAB — HIV ANTIBODY (ROUTINE TESTING W REFLEX): HIV Screen 4th Generation wRfx: NONREACTIVE

## 2023-03-08 LAB — CBG MONITORING, ED: Glucose-Capillary: 88 mg/dL (ref 70–99)

## 2023-03-08 LAB — PROTEIN, URINE, RANDOM: Total Protein, Urine: 382 mg/dL

## 2023-03-08 LAB — MAGNESIUM: Magnesium: 1.6 mg/dL — ABNORMAL LOW (ref 1.7–2.4)

## 2023-03-08 MED ORDER — ASPIRIN 81 MG PO TBEC
81.0000 mg | DELAYED_RELEASE_TABLET | Freq: Every day | ORAL | Status: DC
  Administered 2023-03-08 – 2023-03-11 (×4): 81 mg via ORAL
  Filled 2023-03-08 (×3): qty 1

## 2023-03-08 MED ORDER — OXYCODONE-ACETAMINOPHEN 5-325 MG PO TABS
1.0000 | ORAL_TABLET | ORAL | Status: AC | PRN
  Administered 2023-03-08 – 2023-03-09 (×2): 1 via ORAL
  Filled 2023-03-08: qty 1

## 2023-03-08 MED ORDER — POTASSIUM CHLORIDE ER 10 MEQ PO TBCR
10.0000 meq | EXTENDED_RELEASE_TABLET | Freq: Every day | ORAL | Status: AC
  Administered 2023-03-08 – 2023-03-09 (×2): 10 meq via ORAL
  Filled 2023-03-08 (×3): qty 1

## 2023-03-08 MED ORDER — POTASSIUM CHLORIDE CRYS ER 10 MEQ PO TBCR
EXTENDED_RELEASE_TABLET | ORAL | Status: AC
  Filled 2023-03-08: qty 1

## 2023-03-08 MED ORDER — ISOSORBIDE MONONITRATE ER 30 MG PO TB24
ORAL_TABLET | ORAL | Status: AC
  Filled 2023-03-08: qty 1

## 2023-03-08 MED ORDER — ASPIRIN 81 MG PO TBEC
DELAYED_RELEASE_TABLET | ORAL | Status: AC
  Filled 2023-03-08: qty 1

## 2023-03-08 MED ORDER — OXYCODONE-ACETAMINOPHEN 5-325 MG PO TABS
ORAL_TABLET | ORAL | Status: AC
  Filled 2023-03-08: qty 1

## 2023-03-08 MED ORDER — POTASSIUM CHLORIDE ER 10 MEQ PO TBCR: 10.0000 meq | EXTENDED_RELEASE_TABLET | Freq: Every day | ORAL | Status: DC

## 2023-03-08 MED ORDER — AMLODIPINE BESYLATE 10 MG PO TABS
ORAL_TABLET | ORAL | Status: AC
  Filled 2023-03-08: qty 1

## 2023-03-08 MED ORDER — FUROSEMIDE 10 MG/ML IJ SOLN
INTRAMUSCULAR | Status: AC
  Filled 2023-03-08: qty 4

## 2023-03-08 MED ORDER — FUROSEMIDE 10 MG/ML IJ SOLN
40.0000 mg | Freq: Two times a day (BID) | INTRAMUSCULAR | Status: AC
  Administered 2023-03-08 – 2023-03-09 (×2): 40 mg via INTRAVENOUS
  Filled 2023-03-08: qty 4

## 2023-03-08 MED ORDER — MOMETASONE FURO-FORMOTEROL FUM 200-5 MCG/ACT IN AERO
2.0000 | INHALATION_SPRAY | Freq: Two times a day (BID) | RESPIRATORY_TRACT | Status: DC
  Administered 2023-03-08 – 2023-03-11 (×6): 2 via RESPIRATORY_TRACT
  Filled 2023-03-08: qty 8.8

## 2023-03-08 MED ORDER — GABAPENTIN 100 MG PO CAPS
100.0000 mg | ORAL_CAPSULE | Freq: Every day | ORAL | Status: DC
  Administered 2023-03-08 – 2023-03-10 (×3): 100 mg via ORAL
  Filled 2023-03-08 (×3): qty 1

## 2023-03-08 MED ORDER — HYDRALAZINE HCL 50 MG PO TABS
50.0000 mg | ORAL_TABLET | Freq: Three times a day (TID) | ORAL | Status: DC
  Administered 2023-03-08 – 2023-03-11 (×10): 50 mg via ORAL
  Filled 2023-03-08 (×9): qty 1

## 2023-03-08 MED ORDER — NEBIVOLOL HCL 10 MG PO TABS
10.0000 mg | ORAL_TABLET | Freq: Every day | ORAL | Status: DC
  Administered 2023-03-08 – 2023-03-11 (×4): 10 mg via ORAL
  Filled 2023-03-08 (×5): qty 1

## 2023-03-08 MED ORDER — PANTOPRAZOLE SODIUM 40 MG PO TBEC
40.0000 mg | DELAYED_RELEASE_TABLET | Freq: Every day | ORAL | Status: DC
  Administered 2023-03-08 – 2023-03-11 (×4): 40 mg via ORAL
  Filled 2023-03-08 (×3): qty 1

## 2023-03-08 MED ORDER — AMLODIPINE BESYLATE 10 MG PO TABS
10.0000 mg | ORAL_TABLET | Freq: Every day | ORAL | Status: DC
  Administered 2023-03-08 – 2023-03-11 (×4): 10 mg via ORAL
  Filled 2023-03-08 (×3): qty 1

## 2023-03-08 MED ORDER — ISOSORBIDE MONONITRATE ER 30 MG PO TB24
15.0000 mg | ORAL_TABLET | Freq: Every day | ORAL | Status: DC
  Administered 2023-03-08 – 2023-03-11 (×4): 15 mg via ORAL
  Filled 2023-03-08 (×3): qty 1

## 2023-03-08 MED ORDER — HEPARIN SODIUM (PORCINE) 5000 UNIT/ML IJ SOLN
INTRAMUSCULAR | Status: AC
  Filled 2023-03-08: qty 1

## 2023-03-08 MED ORDER — PANTOPRAZOLE SODIUM 40 MG PO TBEC
DELAYED_RELEASE_TABLET | ORAL | Status: AC
  Filled 2023-03-08: qty 1

## 2023-03-08 MED ORDER — HYDRALAZINE HCL 50 MG PO TABS
ORAL_TABLET | ORAL | Status: AC
  Filled 2023-03-08: qty 1

## 2023-03-08 MED ORDER — ATORVASTATIN CALCIUM 80 MG PO TABS
80.0000 mg | ORAL_TABLET | Freq: Every day | ORAL | Status: DC
  Administered 2023-03-09 – 2023-03-11 (×3): 80 mg via ORAL
  Filled 2023-03-08 (×3): qty 1

## 2023-03-08 NOTE — TOC Initial Note (Signed)
Transition of Care [facility]) - Initial/Assessment Note    Patient Details  Name: Carol [name] MRN: [id number] Date of Birth: 03-Dec-1960  Transition of Care [facility]) CM/SW Contact:    Leone Haven, RN Phone Number: 03/08/2023, 4:06 PM  Clinical Narrative:                 From home alone,  she has PCP, Dr. Claudie Revering at [facility] on [address].  She has insurance on file.  She states she does not currently have any HH services in place at this time.  She states she is working with [employer] [phone] trying to get an aide (pcs).  She states she does have a walker.  Her fiance is at the bedside, he will be transporting her home at discharge and he is her support system.  She states she gets her medications from [facility] pharmacy by delivery.  Pta she is self ambulatory.   Expected Discharge Plan: Home/Self Care Barriers to Discharge: Continued Medical Work up   Patient Goals and CMS Choice Patient states their goals for this hospitalization and ongoing recovery are:: return home   Choice offered to / list presented to : NA      Expected Discharge Plan and Services In-house Referral: NA Discharge Planning Services: CM Consult Post Acute Care Choice: NA Living arrangements for the past 2 months: Apartment                 DME Arranged: N/A DME Agency: NA       HH Arranged: NA          Prior Living Arrangements/Services Living arrangements for the past 2 months: Apartment Lives with:: Self Patient language and need for interpreter reviewed:: Yes Do you feel safe going back to the place where you live?: Yes      Need for Family Participation in Patient Care: Yes (Comment) Care giver support system in place?: Yes (comment)   Criminal Activity/Legal Involvement Pertinent to Current Situation/Hospitalization: No - Comment as needed  Activities of Daily Living      Permission Sought/Granted Permission sought to share information with : Case  Manager Permission granted to share information with : Yes, Verbal Permission Granted              Emotional Assessment Appearance:: Appears stated age Attitude/Demeanor/Rapport: Engaged Affect (typically observed): Appropriate Orientation: : Oriented to Self, Oriented to Place, Oriented to  Time, Oriented to Situation Alcohol / Substance Use: Not Applicable Psych Involvement: No (comment)  Admission diagnosis:  Volume overload [E87.70] Stage 5 chronic kidney disease not on chronic dialysis (HCC) [N18.5] Acute on chronic congestive heart failure, unspecified heart failure type (HCC) [I50.9] Patient Active Problem List   Diagnosis Date Noted   Volume overload 03/07/2023   Heme positive stool 11/06/2022   Anemia due to chronic blood loss 11/06/2022   Colon cancer screening 11/03/2022   Hypoglycemia 11/02/2022   MDD (major depressive disorder), recurrent episode, moderate (HCC) 11/02/2022   Abnormal barium swallow 11/02/2022   Esophageal stricture 11/02/2022   Esophageal dysmotility 11/02/2022   Antiplatelet or antithrombotic long-term use 11/02/2022   Chronic diastolic CHF (congestive heart failure) (HCC) 11/01/2022   Dysphagia 11/01/2022   Acute exacerbation of CHF (congestive heart failure) (HCC) 05/09/2022   High anion gap metabolic acidosis 05/09/2022   Acute on chronic diastolic (congestive) heart failure (HCC) 03/22/2022   COPD not affecting current episode of care 03/22/2022   Class 2 obesity due to excess calories with body  mass index (BMI) of 37.0 to 37.9 in adult 03/22/2022   CHF (congestive heart failure) (HCC) 03/14/2022   borderline Prolonged QT interval 01/22/2022   Thyroid nodule 01/22/2022   Cocaine use disorder, mild, abuse (HCC) 01/02/2022   Diabetic hypoglycemia (HCC) 12/31/2021   CKD (chronic kidney disease) stage 4, GFR 15-29 ml/min (HCC) 12/31/2021   CAD in native artery 12/24/2021   Anxiety with depression 12/24/2021   Pure hypercholesterolemia     Diabetes mellitus (HCC) 01/29/2014   Essential hypertension 01/29/2014   Closed jaw fracture (HCC) 01/29/2014   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 [place] [address] Phone: [phone] Fax: [phone]  [facility] [address] [place] [address] Phone: [phone] Fax: [phone]     Social Determinants of Health (SDOH) Social History: SDOH Screenings   Food Insecurity: No Food Insecurity (11/05/2022)  Housing: Low Risk  (11/05/2022)  Transportation Needs: No Transportation Needs (11/05/2022)  Utilities: Not At Risk (11/05/2022)  Financial Resource Strain: Low Risk  (04/04/2022)  Tobacco Use: High Risk (03/07/2023)   SDOH Interventions:     Readmission Risk Interventions    03/27/2022   10:36 AM  Readmission Risk Prevention Plan  Transportation Screening Complete  Medication Review (RN Care Manager) Complete  PCP or Specialist appointment within 3-5 days of discharge Complete  HRI or Home Care Consult Complete  SW Recovery Care/Counseling Consult Complete  Palliative Care Screening Not Applicable  Skilled Nursing Facility Not Applicable

## 2023-03-08 NOTE — ED Notes (Signed)
ED TO INPATIENT HANDOFF REPORT  ED Nurse Name and Phone #:   S Name/Age/Gender Sheral Flow 62 y.o. female Room/Bed: 001C/001C  Code Status   Code Status: Full Code  Home/SNF/Other Home Patient oriented to: self, place, time, and situation Is this baseline? Yes   Triage Complete: Triage complete  Chief Complaint Volume overload [E87.70]  Triage Note Pt sent by PCP for kidney blood work showed her "kidneys are about to shut down and she needs to come here."   Allergies Allergies  Allergen Reactions   Penicillins Shortness Of Breath and Other (See Comments)    Caused yeast infection Has patient had a PCN reaction causing immediate rash, facial/tongue/throat swelling, SOB or lightheadedness with hypotension: Yes Has patient had a PCN reaction causing severe rash involving mucus membranes or skin necrosis: No Has patient had a PCN reaction that required hospitalization pt was in the hospital at the time of the reaction Has patient had a PCN reaction occurring within the last 10 years: No If all of the above answers are "NO", then may proceed with Cephalosp   Ultram [Tramadol] Other (See Comments)    Made her tongue raw    Level of Care/Admitting Diagnosis ED Disposition     ED Disposition  Admit   Condition  --   Comment  Hospital Area: [facility] [[id number]]  Level of Care: Telemetry Cardiac [[id number]]  May place patient in [facility] at [facility] or [facility] if equivalent level of care is available:: No  Covid Evaluation: Asymptomatic - no recent exposure (last 10 days) testing not required  Diagnosis: Volume overload [960454]  Admitting Physician: Hillary Bow [[id number]]  Attending Physician: Hillary Bow [[id number]]          B Medical/Surgery History Past Medical History:  Diagnosis Date   Abnormal liver function tests    Acute kidney injury (HCC) 01/2014   Hospitalized, Volume depletion, nausea and vomiting   Anxiety     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   Chronic back pain    Depression    Diabetes mellitus    GERD (gastroesophageal reflux disease)    Heart murmur    Hypertension    NSTEMI (non-ST elevated myocardial infarction) (HCC) 12/20/2021   Palpitations    Parkinson's disease    Past Surgical History:  Procedure Laterality Date   BALLOON DILATION N/A 11/06/2022   Procedure: BALLOON DILATION;  Surgeon: Napoleon Form, MD;  Location: [facility];  Service: Gastroenterology;  Laterality: N/A;   CARPAL TUNNEL RELEASE     COLONOSCOPY WITH PROPOFOL N/A 11/06/2022   Procedure: COLONOSCOPY WITH PROPOFOL;  Surgeon: Napoleon Form, MD;  Location: [facility];  Service: Gastroenterology;  Laterality: N/A;   ESOPHAGOGASTRODUODENOSCOPY (EGD) WITH PROPOFOL N/A 11/06/2022   Procedure: ESOPHAGOGASTRODUODENOSCOPY (EGD) WITH PROPOFOL;  Surgeon: Napoleon Form, MD;  Location: [facility];  Service: Gastroenterology;  Laterality: N/A;   IR FLUORO GUIDE CV LINE RIGHT  01/26/2022   IR US GUIDE VASC ACCESS RIGHT  01/26/2022   LEFT HEART CATH AND CORONARY ANGIOGRAPHY N/A 12/20/2021   Procedure: LEFT HEART CATH AND CORONARY ANGIOGRAPHY;  Surgeon: Kathleene Hazel, MD;  Location: [facility] INVAS[facility] CV LAB;  Service: Cardiovascular;  Laterality: N/A;   OPEN REDUCTION INTERNAL FIXATION (ORIF) FINGER WITH RADIAL BONE GRAFT Right 09/12/2015   Procedure: RIGHT MIDDLE FINGER CLOSED REDUCTION AND PINNING;  Surgeon: Bradly Bienenstock, MD;  Location: [facility] OR;  Service: Orthopedics;  Laterality: Right;   TONSILLECTOMY  TUBAL LIGATION       A IV Location/Drains/Wounds Patient Lines/Drains/Airways Status     Active Line/Drains/Airways     Name Placement date Placement time Site Days   Peripheral IV 03/07/23 20 G 1" Anterior;Right Forearm 03/07/23  2220  Forearm  1            Intake/Output Last 24 hours  Intake/Output Summary (Last 24 hours) at 03/08/2023 1137 Last data filed at 03/08/2023  0254 Gross per 24 hour  Intake --  Output 750 ml  Net -750 ml    Labs/Imaging Results for orders placed or performed during the hospital encounter of 03/07/23 (from the past 48 hour(s))  Basic metabolic panel     Status: Abnormal   Collection Time: 03/07/23  3:26 PM  Result Value Ref Range   Sodium 135 135 - 145 mmol/L   Potassium 3.6 3.5 - 5.1 mmol/L   Chloride 104 98 - 111 mmol/L   CO2 16 (L) 22 - 32 mmol/L   Glucose, Bld 148 (H) 70 - 99 mg/dL    Comment: Glucose reference range applies only to samples taken after fasting for at least 8 hours.   BUN 39 (H) 6 - 20 mg/dL   Creatinine, Ser 0.86 (H) 0.44 - 1.00 mg/dL   Calcium 8.0 (L) 8.9 - 10.3 mg/dL   GFR, Estimated 14 (L) >60 mL/min    Comment: (NOTE) Calculated using the CKD-EPI Creatinine Equation (2021)    Anion gap 15 5 - 15    Comment: Performed at [facility], [address] [place], [place] [address]  CBC with Differential     Status: Abnormal   Collection Time: 03/07/23  3:26 PM  Result Value Ref Range   WBC 6.1 4.0 - 10.5 K/uL   RBC 3.02 (L) 3.87 - 5.11 MIL/uL   Hemoglobin 9.0 (L) 12.0 - 15.0 g/dL   HCT 96.2 (L) 95.2 - 84.1 %   MCV 91.1 80.0 - 100.0 fL   MCH 29.8 26.0 - 34.0 pg   MCHC 32.7 30.0 - 36.0 g/dL   RDW 32.4 40.1 - 02.7 %   Platelets 194 150 - 400 K/uL   nRBC 0.0 0.0 - 0.2 %   Neutrophils Relative % 65 %   Neutro Abs 3.9 1.7 - 7.7 K/uL   Lymphocytes Relative 20 %   Lymphs Abs 1.2 0.7 - 4.0 K/uL   Monocytes Relative 13 %   Monocytes Absolute 0.8 0.1 - 1.0 K/uL   Eosinophils Relative 2 %   Eosinophils Absolute 0.1 0.0 - 0.5 K/uL   Basophils Relative 0 %   Basophils Absolute 0.0 0.0 - 0.1 K/uL   Immature Granulocytes 0 %   Abs Immature Granulocytes 0.02 0.00 - 0.07 K/uL    Comment: Performed at [facility], [address], [place], [place] [address]  Brain natriuretic peptide     Status: Abnormal   Collection Time: 03/07/23  3:26 PM  Result Value Ref Range   B Natriuretic Peptide  2,525.3 (H) 0.0 - 100.0 pg/mL    Comment: Performed at [facility], [address], [place], [place] [address]  CBG monitoring, ED     Status: Abnormal   Collection Time: 03/07/23 11:58 PM  Result Value Ref Range   Glucose-Capillary 134 (H) 70 - 99 mg/dL    Comment: Glucose reference range applies only to samples taken after fasting for at least 8 hours.  CBC     Status: Abnormal  Collection Time: 03/08/23  5:17 AM  Result Value Ref Range   WBC 5.9 4.0 - 10.5 K/uL   RBC 2.93 (L) 3.87 - 5.11 MIL/uL   Hemoglobin 8.4 (L) 12.0 - 15.0 g/dL   HCT 52.8 (L) 41.3 - 24.4 %   MCV 88.7 80.0 - 100.0 fL   MCH 28.7 26.0 - 34.0 pg   MCHC 32.3 30.0 - 36.0 g/dL   RDW 01.0 27.2 - 53.6 %   Platelets 180 150 - 400 K/uL   nRBC 0.0 0.0 - 0.2 %    Comment: Performed at [facility], [address], [place], [place] [address]  Urinalysis, Complete w Microscopic -Urine, Clean Catch     Status: Abnormal   Collection Time: 03/08/23  5:17 AM  Result Value Ref Range   Color, Urine YELLOW YELLOW   APPearance HAZY (A) CLEAR   Specific Gravity, Urine 1.007 1.005 - 1.030   pH 7.0 5.0 - 8.0   Glucose, UA NEGATIVE NEGATIVE mg/dL   Hgb urine dipstick NEGATIVE NEGATIVE   Bilirubin Urine NEGATIVE NEGATIVE   Ketones, ur NEGATIVE NEGATIVE mg/dL   Protein, ur >=474 (A) NEGATIVE mg/dL   Nitrite NEGATIVE NEGATIVE   Leukocytes,Ua TRACE (A) NEGATIVE   RBC / HPF 0-5 0 - 5 RBC/hpf   WBC, UA 0-5 0 - 5 WBC/hpf   Bacteria, UA MANY (A) NONE SEEN   Squamous Epithelial / HPF 0-5 0 - 5 /HPF    Comment: Performed at [facility], [address], [place], [place] [address]  Protein, urine, random     Status: None   Collection Time: 03/08/23  5:17 AM  Result Value Ref Range   Total Protein, Urine 382 mg/dL    Comment: RESULTS CONFIRMED BY MANUAL DILUTION Performed at [facility], [address], [place], [place] [address]   Protein / creatinine ratio, urine     Status: Abnormal   Collection Time:  03/08/23  5:17 AM  Result Value Ref Range   Creatinine, Urine 49 mg/dL   Total Protein, Urine 380 mg/dL    Comment: RESULTS CONFIRMED BY MANUAL DILUTION   Protein Creatinine Ratio 7.76 (H) 0.00 - 0.15 mg/mg[Cre]    Comment: Performed at [facility], [address] [place], [place] [address]  CBG monitoring, ED     Status: None   Collection Time: 03/08/23  7:18 AM  Result Value Ref Range   Glucose-Capillary 88 70 - 99 mg/dL    Comment: Glucose reference range applies only to samples taken after fasting for at least 8 hours.   ECHOCARDIOGRAM COMPLETE  Result Date: 03/08/2023    ECHOCARDIOGRAM REPORT   Patient Name:   Carol [name] Date of Exam: 03/08/2023 Medical Rec #:  [id number]               Height:       64.0 in Accession #:    [id number]              Weight:       182.1 lb Date of Birth:  [birth year]               BSA:          1.880 m Patient Age:    62 years                BP:           189/91 mmHg Patient Gender: F  HR:           68 bpm. Exam Location:  Inpatient Procedure: 2D Echo, Cardiac Doppler and Color Doppler Indications:    CHF- Acute Diastolic  History:        Patient has prior history of Echocardiogram examinations, most                 recent 12/20/2021. CHF, NSTEMI; Risk Factors:Diabetes and                 Hypertension.  Sonographer:    Raeford Razor Referring Phys: [phone] JARED M GARDNER  Sonographer Comments: Technically difficult study due to poor echo windows. IMPRESSIONS  1. Left ventricular ejection fraction, by estimation, is 60 to 65%. The left ventricle has normal function. The left ventricle has no regional wall motion abnormalities. There is mild asymmetric left ventricular hypertrophy of the basal-septal segment. Left ventricular diastolic parameters are consistent with Grade II diastolic dysfunction (pseudonormalization).  2. Right ventricular systolic function is normal. The right ventricular size is normal. There is moderately elevated  pulmonary artery systolic pressure. The estimated right ventricular systolic pressure is 49.5 mmHg.  3. Left atrial size was moderately dilated.  4. The mitral valve is normal in structure. Mild mitral valve regurgitation. No evidence of mitral stenosis.  5. The aortic valve is tricuspid. There is mild calcification of the aortic valve. Aortic valve regurgitation is trivial. Aortic valve sclerosis/calcification is present, without any evidence of aortic stenosis.  6. The inferior vena cava is normal in size with <50% respiratory variability, suggesting right atrial pressure of 8 mmHg. FINDINGS  Left Ventricle: Left ventricular ejection fraction, by estimation, is 60 to 65%. The left ventricle has normal function. The left ventricle has no regional wall motion abnormalities. The left ventricular internal cavity size was normal in size. There is  mild asymmetric left ventricular hypertrophy of the basal-septal segment. Left ventricular diastolic parameters are consistent with Grade II diastolic dysfunction (pseudonormalization). Right Ventricle: The right ventricular size is normal. No increase in right ventricular wall thickness. Right ventricular systolic function is normal. There is moderately elevated pulmonary artery systolic pressure. The tricuspid regurgitant velocity is 3.22 m/s, and with an assumed right atrial pressure of 8 mmHg, the estimated right ventricular systolic pressure is 49.5 mmHg. Left Atrium: Left atrial size was moderately dilated. Right Atrium: Right atrial size was normal in size. Pericardium: There is no evidence of pericardial effusion. Mitral Valve: The mitral valve is normal in structure. Mild mitral valve regurgitation. No evidence of mitral valve stenosis. Tricuspid Valve: The tricuspid valve is normal in structure. Tricuspid valve regurgitation is mild . No evidence of tricuspid stenosis. Aortic Valve: The aortic valve is tricuspid. There is mild calcification of the aortic valve.  Aortic valve regurgitation is trivial. Aortic valve sclerosis/calcification is present, without any evidence of aortic stenosis. Aortic valve peak gradient measures 4.4 mmHg. Pulmonic Valve: The pulmonic valve was not well visualized. Pulmonic valve regurgitation is not visualized. No evidence of pulmonic stenosis. Aorta: The aortic root is normal in size and structure. Venous: The inferior vena cava is normal in size with less than 50% respiratory variability, suggesting right atrial pressure of 8 mmHg. IAS/Shunts: No atrial level shunt detected by color flow Doppler.  LEFT VENTRICLE PLAX 2D LVIDd:         4.90 cm   Diastology LVIDs:         3.30 cm   LV e' medial:    4.57 cm/s LV PW:  1.00 cm   LV E/e' medial:  31.9 LV IVS:        1.00 cm   LV e' lateral:   7.40 cm/s LVOT diam:     1.70 cm   LV E/e' lateral: 19.7 LV SV:         39 LV SV Index:   21 LVOT Area:     2.27 cm  RIGHT VENTRICLE             IVC RV Basal diam:  3.00 cm     IVC diam: 1.80 cm RV S prime:     12.50 cm/s TAPSE (M-mode): 1.5 cm LEFT ATRIUM             Index        RIGHT ATRIUM           Index LA diam:        4.20 cm 2.23 cm/m   RA Area:     11.80 cm LA Vol (A2C):   69.5 ml 36.97 ml/m  RA Volume:   25.80 ml  13.72 ml/m LA Vol (A4C):   78.0 ml 41.49 ml/m LA Biplane Vol: 78.0 ml 41.49 ml/m  AORTIC VALVE AV Area (Vmax): 1.71 cm AV Vmax:        105.00 cm/s AV Peak Grad:   4.4 mmHg LVOT Vmax:      79.10 cm/s LVOT Vmean:     50.900 cm/s LVOT VTI:       0.172 m  AORTA Ao Root diam: 3.20 cm MITRAL VALVE                TRICUSPID VALVE MV Area (PHT): 3.99 cm     TR Peak grad:   41.5 mmHg MV Decel Time: 190 msec     TR Vmax:        322.00 cm/s MR Peak grad: 85.7 mmHg MR Vmax:      463.00 cm/s   SHUNTS MV E velocity: 146.00 cm/s  Systemic VTI:  0.17 m MV A velocity: 67.00 cm/s   Systemic Diam: 1.70 cm MV E/A ratio:  2.18 Arvilla Meres MD Electronically signed by Arvilla Meres MD Signature Date/Time: 03/08/2023/9:03:40 AM    Final     DG Chest Port 1 View  Result Date: 03/07/2023 CLINICAL DATA:  Fluid overload EXAM: PORTABLE CHEST 1 VIEW COMPARISON:  07/21/2022 FINDINGS: The lungs are symmetrically well inflated. There is central pulmonary vascular congestion with pulmonary vascular redistribution of the lung apices in keeping with changes of early cardiogenic failure or volume overload. Bibasilar somewhat asymmetric peribronchial infiltrates have developed which may reflect changes of edema or airway inflammation. No pneumothorax or pleural effusion. Stable cardiomegaly. No acute bone abnormality. IMPRESSION: 1. Cardiomegaly with early cardiogenic failure or volume overload. 2. Bibasilar peribronchial infiltrates which may reflect changes of edema or airway inflammation. Electronically Signed   By: Helyn Numbers M.D.   On: 03/07/2023 19:40    Pending Labs Unresulted Labs (From admission, onward)     Start     Ordered   03/08/23 0500  Basic metabolic panel  Tomorrow morning,   R        03/07/23 2232   03/07/23 2240  Hepatic function panel  Once,   R        03/07/23 2239   03/07/23 2240  Magnesium  Once,   R        03/07/23 2239   03/07/23 2225  HIV Antibody (routine testing w rflx)  (HIV  Antibody (Routine testing w reflex) panel)  Once,   R        03/07/23 2232            Vitals/Pain Today's Vitals   03/08/23 0723 03/08/23 0730 03/08/23 0800 03/08/23 0819  BP:  (!) 189/91 (!) 187/101   Pulse:  68 68   Resp:  (!) 21 18   Temp:    98.1 F (36.7 C)  TempSrc:    Oral  SpO2:  100% 99%   Weight:      Height:      PainSc: 10-Worst pain ever       Isolation Precautions No active isolations  Medications Medications  heparin injection 5,000 Units (5,000 Units Subcutaneous Given 03/08/23 0636)  acetaminophen (TYLENOL) tablet 650 mg (650 mg Oral Given 03/08/23 0730)    Or  acetaminophen (TYLENOL) suppository 650 mg ( Rectal See Alternative 03/08/23 0730)  insulin aspart (novoLOG) injection 0-9 Units (  Subcutaneous Not Given 03/08/23 0723)  insulin aspart (novoLOG) injection 0-5 Units ( Subcutaneous Not Given 03/08/23 0001)  furosemide (LASIX) injection 40 mg (40 mg Intravenous Given 03/07/23 2220)    Mobility walks     Focused Assessments Cardiac Assessment Handoff:    Lab Results  Component Value Date   CKTOTAL 140 04/26/2018   CKMB 1.3 02/23/2008   TROPONINI <0.01        NO INDICATION OF MYOCARDIAL INJURY. 02/23/2008   Lab Results  Component Value Date   DDIMER 0.32 07/03/2011   Does the Patient currently have chest pain? No    R Recommendations: See Admitting Provider Note  Report given to:   Additional Notes:

## 2023-03-08 NOTE — Progress Notes (Signed)
PROGRESS NOTE  Levy Wellman ZOX:[id number] DOB: 11/19/1960 DOA: 03/07/2023 PCP: System, Provider Not In  HPI/Recap of past 24 hours: Beva Remund is a 62 y.o. female with medical history significant of COPD, hypertension, diabetes, CKD stage IV, CHF, cocaine use disorder.  Pt went to PCP yesterday, recently transferred back to this provider after being gone for 7 years.  Labs drawn.  Got call today saying her kidneys shutting down.   Patient states that over the past week her legs have become progressively more swollen and that patient feels short of breath when walking. Patient states she has been on Lasix however is unable to take them due to her kidney function. Patient does not currently see nephrology. Admitted for volume overload/acute on chronic dCHF. 2D echo completed LVEF 60-65% with GIIDD.  03/08/23:  2+ piitting edema in lower extremities bilaterally.  Received 1  dose of IV Lasix in the ED   Assessment/Plan: Principal Problem:   Volume overload Active Problems:   CKD (chronic kidney disease) stage 4, GFR 15-29 ml/min (HCC)   Acute on chronic diastolic (congestive) heart failure (HCC)   Diabetes mellitus (HCC)   Essential hypertension   Cocaine use disorder, mild, abuse (HCC)  Acute on chronic HFpEF Admitted for volume overload/acute on chronic dCHF. 2D echo completed LVEF 60-65% with GIIDD 2+ piitting edema in lower extremities bilaterally.  Received 1  dose of IV Lasix in the ED BNP>2500 Start strict Iand O and daily weigh IV Lasix as BP allows Closely monitor renal function and electrolytes while on IV diuretics  CKD IV At baseline Monitor urine output Avoid nephrotoxic agents  Hypertension, BP is not at goal, elevated Resume home po antihypertensives Closely monitor vitals  Type 2 diabetes with hyperglycemia Last A1C 6.2 on 11/01/22 ISS  Diabetic polyneuropathy Low dose home gabapentin    Code Status: Full   Family Communication:  Family member at bedside sleeping   Disposition Plan: admitted to tele cardiac unit    Consultants: None   Procedures: 2D echo   Antimicrobials: None   DVT prophylaxis:  Hep sq TID  Status is: Observation     Objective: Vitals:   03/08/23 0800 03/08/23 0819 03/08/23 1230 03/08/23 1304  BP: (!) 187/101  (!) 186/94 (!) 215/91  Pulse: 68  72 74  Resp: 18  (!) 22 16  Temp:  98.1 F (36.7 C)  98.2 F (36.8 C)  TempSrc:  Oral  Oral  SpO2: 99%  99% 100%  Weight:    93.6 kg  Height:    5\' 4"  (1.626 m)    Intake/Output Summary (Last 24 hours) at 03/08/2023 1319 Last data filed at 03/08/2023 0254 Gross per 24 hour  Intake --  Output 750 ml  Net -750 ml   Filed Weights   03/07/23 1519 03/08/23 1304  Weight: 82.6 kg 93.6 kg    Exam:  General: 62 y.o. year-old female well developed well nourished in no acute distress.  Alert and oriented x3. Cardiovascular: Regular rate and rhythm with no rubs or gallops.  No thyromegaly or JVD noted.   Respiratory: Clear to auscultation with no wheezes or rales. Good inspiratory effort. Abdomen: Soft nontender nondistended with normal bowel sounds x4 quadrants. Musculoskeletal: 2+ pitting edema in lower extremity bilaterally. Skin: No ulcerative lesions noted or rashes, Psychiatry: Mood is appropriate for condition and setting   Data Reviewed: CBC: Recent Labs  Lab 03/04/23 0000 03/07/23 1526 03/08/23 0517  WBC 5.3 6.1 5.9  NEUTROABS  --  3.9  --   HGB 9.1* 9.0* 8.4*  HCT 28.4* 27.5* 26.0*  MCV 91.3 91.1 88.7  PLT 200 194 180   Basic Metabolic Panel: Recent Labs  Lab 03/04/23 0000 03/07/23 1526  NA 138 135  K 4.3 3.6  CL 109 104  CO2 18* 16*  GLUCOSE 132* 148*  BUN 40* 39*  CREATININE 3.54* 3.48*  CALCIUM 8.3* 8.0*   GFR: Estimated Creatinine Clearance: 19.1 mL/min (A) (by C-G formula based on SCr of 3.48 mg/dL (H)). Liver Function Tests: Recent Labs  Lab 03/04/23 0000  AST 16  ALT 9  BILITOT 0.5   PROT 5.9*   No results for input(s): "LIPASE", "AMYLASE" in the last 168 hours. No results for input(s): "AMMONIA" in the last 168 hours. Coagulation Profile: No results for input(s): "INR", "PROTIME" in the last 168 hours. Cardiac Enzymes: No results for input(s): "CKTOTAL", "CKMB", "CKMBINDEX", "TROPONINI" in the last 168 hours. BNP (last 3 results) No results for input(s): "PROBNP" in the last 8760 hours. HbA1C: No results for input(s): "HGBA1C" in the last 72 hours. CBG: Recent Labs  Lab 03/07/23 2358 03/08/23 0718  GLUCAP 134* 88   Lipid Profile: No results for input(s): "CHOL", "HDL", "LDLCALC", "TRIG", "CHOLHDL", "LDLDIRECT" in the last 72 hours. Thyroid Function Tests: No results for input(s): "TSH", "T4TOTAL", "FREET4", "T3FREE", "THYROIDAB" in the last 72 hours. Anemia Panel: No results for input(s): "VITAMINB12", "FOLATE", "FERRITIN", "TIBC", "IRON", "RETICCTPCT" in the last 72 hours. Urine analysis:    Component Value Date/Time   COLORURINE YELLOW 03/08/2023 0517   APPEARANCEUR HAZY (A) 03/08/2023 0517   LABSPEC 1.007 03/08/2023 0517   PHURINE 7.0 03/08/2023 0517   GLUCOSEU NEGATIVE 03/08/2023 0517   HGBUR NEGATIVE 03/08/2023 0517   BILIRUBINUR NEGATIVE 03/08/2023 0517   KETONESUR NEGATIVE 03/08/2023 0517   PROTEINUR >=300 (A) 03/08/2023 0517   UROBILINOGEN 0.2 01/29/2014 2116   NITRITE NEGATIVE 03/08/2023 0517   LEUKOCYTESUR TRACE (A) 03/08/2023 0517   Sepsis Labs: @LABRCNTIP (procalcitonin:4,lacticidven:4)  )No results found for this or any previous visit (from the past 240 hour(s)).    Studies: ECHOCARDIOGRAM COMPLETE  Result Date: 03/08/2023    ECHOCARDIOGRAM REPORT   Patient Name:   ANNE-MARIE GENSON Date of Exam: 03/08/2023 Medical Rec #:  [id number]               Height:       64.0 in Accession #:    [id number]              Weight:       182.1 lb Date of Birth:  Apr 24, 1961               BSA:          1.880 m Patient Age:    62 years                 BP:           189/91 mmHg Patient Gender: F                       HR:           68 bpm. Exam Location:  Inpatient Procedure: 2D Echo, Cardiac Doppler and Color Doppler Indications:    CHF- Acute Diastolic  History:        Patient has prior history of Echocardiogram examinations, most                 recent 12/20/2021. CHF, NSTEMI;  Risk Factors:Diabetes and                 Hypertension.  Sonographer:    Raeford Razor Referring Phys: [phone] JARED M GARDNER  Sonographer Comments: Technically difficult study due to poor echo windows. IMPRESSIONS  1. Left ventricular ejection fraction, by estimation, is 60 to 65%. The left ventricle has normal function. The left ventricle has no regional wall motion abnormalities. There is mild asymmetric left ventricular hypertrophy of the basal-septal segment. Left ventricular diastolic parameters are consistent with Grade II diastolic dysfunction (pseudonormalization).  2. Right ventricular systolic function is normal. The right ventricular size is normal. There is moderately elevated pulmonary artery systolic pressure. The estimated right ventricular systolic pressure is 49.5 mmHg.  3. Left atrial size was moderately dilated.  4. The mitral valve is normal in structure. Mild mitral valve regurgitation. No evidence of mitral stenosis.  5. The aortic valve is tricuspid. There is mild calcification of the aortic valve. Aortic valve regurgitation is trivial. Aortic valve sclerosis/calcification is present, without any evidence of aortic stenosis.  6. The inferior vena cava is normal in size with <50% respiratory variability, suggesting right atrial pressure of 8 mmHg. FINDINGS  Left Ventricle: Left ventricular ejection fraction, by estimation, is 60 to 65%. The left ventricle has normal function. The left ventricle has no regional wall motion abnormalities. The left ventricular internal cavity size was normal in size. There is  mild asymmetric left ventricular hypertrophy of the basal-septal  segment. Left ventricular diastolic parameters are consistent with Grade II diastolic dysfunction (pseudonormalization). Right Ventricle: The right ventricular size is normal. No increase in right ventricular wall thickness. Right ventricular systolic function is normal. There is moderately elevated pulmonary artery systolic pressure. The tricuspid regurgitant velocity is 3.22 m/s, and with an assumed right atrial pressure of 8 mmHg, the estimated right ventricular systolic pressure is 49.5 mmHg. Left Atrium: Left atrial size was moderately dilated. Right Atrium: Right atrial size was normal in size. Pericardium: There is no evidence of pericardial effusion. Mitral Valve: The mitral valve is normal in structure. Mild mitral valve regurgitation. No evidence of mitral valve stenosis. Tricuspid Valve: The tricuspid valve is normal in structure. Tricuspid valve regurgitation is mild . No evidence of tricuspid stenosis. Aortic Valve: The aortic valve is tricuspid. There is mild calcification of the aortic valve. Aortic valve regurgitation is trivial. Aortic valve sclerosis/calcification is present, without any evidence of aortic stenosis. Aortic valve peak gradient measures 4.4 mmHg. Pulmonic Valve: The pulmonic valve was not well visualized. Pulmonic valve regurgitation is not visualized. No evidence of pulmonic stenosis. Aorta: The aortic root is normal in size and structure. Venous: The inferior vena cava is normal in size with less than 50% respiratory variability, suggesting right atrial pressure of 8 mmHg. IAS/Shunts: No atrial level shunt detected by color flow Doppler.  LEFT VENTRICLE PLAX 2D LVIDd:         4.90 cm   Diastology LVIDs:         3.30 cm   LV e' medial:    4.57 cm/s LV PW:         1.00 cm   LV E/e' medial:  31.9 LV IVS:        1.00 cm   LV e' lateral:   7.40 cm/s LVOT diam:     1.70 cm   LV E/e' lateral: 19.7 LV SV:         39 LV SV Index:   21 LVOT Area:  2.27 cm  RIGHT VENTRICLE             IVC  RV Basal diam:  3.00 cm     IVC diam: 1.80 cm RV S prime:     12.50 cm/s TAPSE (M-mode): 1.5 cm LEFT ATRIUM             Index        RIGHT ATRIUM           Index LA diam:        4.20 cm 2.23 cm/m   RA Area:     11.80 cm LA Vol (A2C):   69.5 ml 36.97 ml/m  RA Volume:   25.80 ml  13.72 ml/m LA Vol (A4C):   78.0 ml 41.49 ml/m LA Biplane Vol: 78.0 ml 41.49 ml/m  AORTIC VALVE AV Area (Vmax): 1.71 cm AV Vmax:        105.00 cm/s AV Peak Grad:   4.4 mmHg LVOT Vmax:      79.10 cm/s LVOT Vmean:     50.900 cm/s LVOT VTI:       0.172 m  AORTA Ao Root diam: 3.20 cm MITRAL VALVE                TRICUSPID VALVE MV Area (PHT): 3.99 cm     TR Peak grad:   41.5 mmHg MV Decel Time: 190 msec     TR Vmax:        322.00 cm/s MR Peak grad: 85.7 mmHg MR Vmax:      463.00 cm/s   SHUNTS MV E velocity: 146.00 cm/s  Systemic VTI:  0.17 m MV A velocity: 67.00 cm/s   Systemic Diam: 1.70 cm MV E/A ratio:  2.18 Arvilla Meres MD Electronically signed by Arvilla Meres MD Signature Date/Time: 03/08/2023/9:03:40 AM    Final    DG Chest Port 1 View  Result Date: 03/07/2023 CLINICAL DATA:  Fluid overload EXAM: PORTABLE CHEST 1 VIEW COMPARISON:  07/21/2022 FINDINGS: The lungs are symmetrically well inflated. There is central pulmonary vascular congestion with pulmonary vascular redistribution of the lung apices in keeping with changes of early cardiogenic failure or volume overload. Bibasilar somewhat asymmetric peribronchial infiltrates have developed which may reflect changes of edema or airway inflammation. No pneumothorax or pleural effusion. Stable cardiomegaly. No acute bone abnormality. IMPRESSION: 1. Cardiomegaly with early cardiogenic failure or volume overload. 2. Bibasilar peribronchial infiltrates which may reflect changes of edema or airway inflammation. Electronically Signed   By: Helyn Numbers M.D.   On: 03/07/2023 19:40    Scheduled Meds:  heparin  5,000 Units Subcutaneous Q8H   insulin aspart  0-5 Units Subcutaneous  QHS   insulin aspart  0-9 Units Subcutaneous TID WC    Continuous Infusions:   LOS: 0 days     Darlin Drop, MD [facility] Pager [phone]  If 7PM-7AM, please contact night-coverage [email or web address] Password [email or web address] 03/08/2023, 1:19 PM

## 2023-03-08 NOTE — Progress Notes (Signed)
Echocardiogram 2D Echocardiogram has been performed.  Carol [name] 03/08/2023, 9:01 AM

## 2023-03-09 DIAGNOSIS — N184 Chronic kidney disease, stage 4 (severe): Secondary | ICD-10-CM | POA: Diagnosis not present

## 2023-03-09 DIAGNOSIS — I5023 Acute on chronic systolic (congestive) heart failure: Secondary | ICD-10-CM | POA: Diagnosis not present

## 2023-03-09 DIAGNOSIS — F1410 Cocaine abuse, uncomplicated: Secondary | ICD-10-CM | POA: Diagnosis not present

## 2023-03-09 DIAGNOSIS — I5033 Acute on chronic diastolic (congestive) heart failure: Secondary | ICD-10-CM | POA: Diagnosis not present

## 2023-03-09 LAB — GLUCOSE, CAPILLARY
Glucose-Capillary: 100 mg/dL — ABNORMAL HIGH (ref 70–99)
Glucose-Capillary: 106 mg/dL — ABNORMAL HIGH (ref 70–99)
Glucose-Capillary: 163 mg/dL — ABNORMAL HIGH (ref 70–99)
Glucose-Capillary: 124 mg/dL — ABNORMAL HIGH (ref 70–99)
Glucose-Capillary: 123 mg/dL — ABNORMAL HIGH (ref 70–99)

## 2023-03-09 MED ORDER — LIDOCAINE 5 % EX PTCH
2.0000 | MEDICATED_PATCH | CUTANEOUS | Status: DC
  Filled 2023-03-09: qty 2

## 2023-03-09 NOTE — Progress Notes (Signed)
PROGRESS NOTE    Carol [name]  RKY:[id number] DOB: [birth year] DOA: 03/07/2023 PCP: System, Provider Not In   Brief Narrative:  Carol [name] is a 62 y.o. female with medical history significant of COPD, hypertension, diabetes, CKD stage IV, CHF, cocaine use disorder.  Presents to the hospital with bilateral lower extremity edema worsening over the past week with recent evaluation by PCP who stated "kidneys are shutting down."  Hospitalist called for admission.  Assessment & Plan:   Principal Problem:   Volume overload Active Problems:   CKD (chronic kidney disease) stage 4, GFR 15-29 ml/min (HCC)   Acute on chronic diastolic (congestive) heart failure (HCC)   Diabetes mellitus (HCC)   Essential hypertension   Cocaine use disorder, mild, abuse (HCC)   Acute on chronic systolic (congestive) heart failure (HCC)  Acute on chronic HFpEF -Likely in setting of noncompliance, dietary education appears to be lacking -Continue aggressive diuresis with IV Lasix -Monitor closely given elevated creatinine as below -Updated echo shows EF 60 to 65% with grade 2 diastolic dysfunction asymmetrical left ventricular hypertrophy at the basal septal segment.  Elevated pulmonary artery systolic pressure. -Strict I's and O's, daily weights   CKD IV -Appears to be near baseline, follow with aggressive diuresis as above -Patient requesting referral to nephrology at discharge, pending clinical course may need to consult nephrology while inpatient.  Follow clinically.   Hypertension, BP is not at goal, elevated -Continue amlodipine, hydralazine, isosorbide, nebivolol -Currently well-controlled   Type 2 diabetes controlled Last A1C 6.2 on 11/01/22 Continue sliding scale insulin, hypoglycemic protocol   Diabetic polyneuropathy Continue home dose gabapentin   Illicit substance use/abuse -Lengthy discussion with patient about ongoing use of illicit substances, most notably cocaine  given her history of heart failure advanced CKD and hypertension this drug would most certainly exacerbate her current situation.  We discussed ongoing use and abuse would likely advance patient towards dialysis.  DVT prophylaxis: heparin 5000 UNIT/ML injection Start: 03/08/23 1354 heparin injection 5,000 Units Start: 03/07/23 2245 Code Status:   Code Status: Full Code Family Communication: At bedside  Status is: Inpatient  Dispo: The patient is from: Home              Anticipated d/c is to: Home              Anticipated d/c date is: 48 to 72 hours pending clinical course              Patient currently not medically stable for discharge  Consultants:  None  Procedures:  None  Antimicrobials:  None  Subjective: No acute issues or events overnight, respiratory status improving, lower extremity edema improving -neither of which have resolved.  Objective: Vitals:   03/08/23 2015 03/09/23 0010 03/09/23 0015 03/09/23 0435  BP: (!) 155/85 128/66    Pulse: 69 69 68 67  Resp:  17  17  Temp:  98.1 F (36.7 C)  98.1 F (36.7 C)  TempSrc:  Oral  Oral  SpO2: 99% 98% 98% 99%  Weight:    94.4 kg  Height:        Intake/Output Summary (Last 24 hours) at 03/09/2023 0736 Last data filed at 03/09/2023 0100 Gross per 24 hour  Intake --  Output 1150 ml  Net -1150 ml   Filed Weights   03/07/23 1519 03/08/23 1304 03/09/23 0435  Weight: 82.6 kg 93.6 kg 94.4 kg    Examination:  General:  Pleasantly resting in bed, No acute distress. Lungs:  Clear to auscultate bilaterally without rhonchi, wheeze, or rales. Heart:  Regular rate and rhythm.  Without murmurs, rubs, or gallops. Abdomen:  Soft, nontender, nondistended.  Without guarding or rebound. Extremities: 3+ pitting edema to the knee bilaterally Skin:  Warm and dry, no erythema.   LOS: 1 day   Time spent:  Azucena Fallen, DO [facility]  If 7PM-7AM, please contact night-coverage [email or web address]  03/09/2023,  7:36 AM

## 2023-03-09 NOTE — Plan of Care (Signed)
Pt blood glucose in normal range

## 2023-03-10 DIAGNOSIS — I5023 Acute on chronic systolic (congestive) heart failure: Secondary | ICD-10-CM | POA: Diagnosis not present

## 2023-03-10 DIAGNOSIS — I5033 Acute on chronic diastolic (congestive) heart failure: Secondary | ICD-10-CM | POA: Diagnosis not present

## 2023-03-10 DIAGNOSIS — N184 Chronic kidney disease, stage 4 (severe): Secondary | ICD-10-CM | POA: Diagnosis not present

## 2023-03-10 DIAGNOSIS — F1410 Cocaine abuse, uncomplicated: Secondary | ICD-10-CM | POA: Diagnosis not present

## 2023-03-10 LAB — BASIC METABOLIC PANEL
Anion gap: 6 (ref 5–15)
BUN: 43 mg/dL — ABNORMAL HIGH (ref 6–20)
CO2: 18 mmol/L — ABNORMAL LOW (ref 22–32)
Calcium: 8.1 mg/dL — ABNORMAL LOW (ref 8.9–10.3)
Chloride: 109 mmol/L (ref 98–111)
Creatinine, Ser: 3.85 mg/dL — ABNORMAL HIGH (ref 0.44–1.00)
GFR, Estimated: 13 mL/min — ABNORMAL LOW (ref >60)
Glucose, Bld: 109 mg/dL — ABNORMAL HIGH (ref 70–99)
Potassium: 4.1 mmol/L (ref 3.5–5.1)
Sodium: 133 mmol/L — ABNORMAL LOW (ref 135–145)

## 2023-03-10 LAB — CBC
HCT: 24.6 % — ABNORMAL LOW (ref 36.0–46.0)
Hemoglobin: 8.1 g/dL — ABNORMAL LOW (ref 12.0–15.0)
MCH: 29.1 pg (ref 26.0–34.0)
MCHC: 32.9 g/dL (ref 30.0–36.0)
MCV: 88.5 fL (ref 80.0–100.0)
Platelets: 212 10*3/uL (ref 150–400)
RBC: 2.78 MIL/uL — ABNORMAL LOW (ref 3.87–5.11)
RDW: 14.6 % (ref 11.5–15.5)
WBC: 5.2 10*3/uL (ref 4.0–10.5)
nRBC: 0 % (ref 0.0–0.2)

## 2023-03-10 LAB — GLUCOSE, CAPILLARY
Glucose-Capillary: 100 mg/dL — ABNORMAL HIGH (ref 70–99)
Glucose-Capillary: 167 mg/dL — ABNORMAL HIGH (ref 70–99)
Glucose-Capillary: 117 mg/dL — ABNORMAL HIGH (ref 70–99)
Glucose-Capillary: 135 mg/dL — ABNORMAL HIGH (ref 70–99)

## 2023-03-10 MED ORDER — FUROSEMIDE 10 MG/ML IJ SOLN
60.0000 mg | Freq: Four times a day (QID) | INTRAMUSCULAR | Status: DC
  Administered 2023-03-10 – 2023-03-11 (×4): 60 mg via INTRAVENOUS
  Filled 2023-03-10 (×4): qty 6

## 2023-03-10 NOTE — Progress Notes (Signed)
Purewick external catheter removed per order at this time. Educated patient that mobility and movement is key to progressing through this hospitalization to discharge home. Verbalized understanding of importance of getting up and moving.

## 2023-03-10 NOTE — Plan of Care (Signed)
  Problem: Education: Goal: Ability to describe self-care measures that may prevent or decrease complications (Diabetes Survival Skills Education) will improve Outcome: Progressing Goal: Individualized Educational Video(s) Outcome: Progressing   Problem: Metabolic: Goal: Ability to maintain appropriate glucose levels will improve Outcome: Progressing   Problem: Nutritional: Goal: Maintenance of adequate nutrition will improve Outcome: Progressing Goal: Progress toward achieving an optimal weight will improve Outcome: Progressing   Problem: Clinical Measurements: Goal: Ability to maintain clinical measurements within normal limits will improve Outcome: Progressing Goal: Will remain free from infection Outcome: Progressing Goal: Diagnostic test results will improve Outcome: Progressing Goal: Respiratory complications will improve Outcome: Progressing Goal: Cardiovascular complication will be avoided Outcome: Progressing

## 2023-03-10 NOTE — Plan of Care (Signed)
Alert and oriented. Educated patient on use of purewick and need for mobility. Purewick removed. Ambulated in hallways with walker. Diuresed with lasix today, voided in BSC. Family remains at bedside.   Problem: Education: Goal: Ability to describe self-care measures that may prevent or decrease complications) will improve Outcome: Progressing Goal: Individualized Educational Video(s) Outcome: Progressing   Problem: Coping: Goal: Ability to adjust to condition or change in health will improve Outcome: Progressing   Problem: Fluid Volume: Goal: Ability to maintain a balanced intake and output will improve Outcome: Progressing   Problem: Health Behavior/Discharge Planning: Goal: Ability to identify and utilize available resources and services will improve Outcome: Progressing Goal: Ability to manage health-related needs will improve Outcome: Progressing   Problem: Metabolic: Goal: Ability to maintain appropriate glucose levels will improve Outcome: Progressing   Problem: Nutritional: Goal: Maintenance of adequate nutrition will improve Outcome: Progressing Goal: Progress toward achieving an optimal weight will improve Outcome: Progressing   Problem: Skin Integrity: Goal: Risk for impaired skin integrity will decrease Outcome: Progressing   Problem: Tissue Perfusion: Goal: Adequacy of tissue perfusion will improve Outcome: Progressing   Problem: Education: Goal: Knowledge of General Education information will improve Description: Including pain rating scale, medication(s)/side effects and non-pharmacologic comfort measures Outcome: Progressing   Problem: Health Behavior/Discharge Planning: Goal: Ability to manage health-related needs will improve Outcome: Progressing   Problem: Clinical Measurements: Goal: Ability to maintain clinical measurements within normal limits will improve Outcome: Progressing Goal: Will remain free from infection Outcome: Progressing Goal:  Diagnostic test results will improve Outcome: Progressing Goal: Respiratory complications will improve Outcome: Progressing Goal: Cardiovascular complication will be avoided Outcome: Progressing   Problem: Activity: Goal: Risk for activity intolerance will decrease Outcome: Progressing   Problem: Nutrition: Goal: Adequate nutrition will be maintained Outcome: Progressing   Problem: Coping: Goal: Level of anxiety will decrease Outcome: Progressing   Problem: Elimination: Goal: Will not experience complications related to bowel motility Outcome: Progressing Goal: Will not experience complications related to urinary retention Outcome: Progressing   Problem: Pain Managment: Goal: General experience of comfort will improve Outcome: Progressing   Problem: Safety: Goal: Ability to remain free from injury will improve Outcome: Progressing   Problem: Skin Integrity: Goal: Risk for impaired skin integrity will decrease Outcome: Progressing

## 2023-03-10 NOTE — Progress Notes (Signed)
PROGRESS NOTE    Carol [name]  [id number]:[id number] DOB: June 23, 1961 DOA: 03/07/2023 PCP: System, Provider Not In   Brief Narrative:  Carol [name] is a 62 y.o. female with medical history significant of COPD, hypertension, diabetes, CKD stage IV, CHF, cocaine use disorder.  Presents to the hospital with bilateral lower extremity edema worsening over the past week with recent evaluation by PCP who stated "kidneys are shutting down."  Hospitalist called for admission.  Assessment & Plan:   Principal Problem:   Volume overload Active Problems:   CKD (chronic kidney disease) stage 4, GFR 15-29 ml/min (HCC)   Acute on chronic diastolic (congestive) heart failure (HCC)   Diabetes mellitus (HCC)   Essential hypertension   Cocaine use disorder, mild, abuse (HCC)   Acute on chronic systolic (congestive) heart failure (HCC)  Acute on chronic HFpEF -Likely in setting of noncompliance, dietary education appears to be lacking -Continue aggressive diuresis with IV Lasix -Creatinine minimally elevated but near baseline, continue to follow  -Updated echo shows EF 60 to 65% with grade 2 diastolic dysfunction asymmetrical left ventricular hypertrophy at the basal septal segment.  Elevated pulmonary artery systolic pressure. -Strict I's and O's, daily weights   CKD IV -Appears to be near baseline, follow with aggressive diuresis as above -Patient requesting referral to nephrology at discharge, pending clinical course may need to consult nephrology while inpatient.  Follow clinically.   Hypertension, BP is not at goal, elevated -Continue amlodipine, hydralazine, isosorbide, nebivolol -Currently well-controlled   Type 2 diabetes controlled Last A1C 6.2 on 11/01/22 Continue sliding scale insulin, hypoglycemic protocol   Diabetic polyneuropathy Continue home dose gabapentin   Illicit substance use/abuse -Lengthy discussion with patient about ongoing use of illicit substances, most  notably cocaine given her history of heart failure advanced CKD and hypertension this drug would most certainly exacerbate her current situation.  We discussed ongoing use and abuse would likely advance patient towards dialysis.  DVT prophylaxis: heparin injection 5,000 Units Start: 03/07/23 2245 Code Status:   Code Status: Full Code Family Communication: At bedside  Status is: Inpatient  Dispo: The patient is from: Home              Anticipated d/c is to: Home              Anticipated d/c date is: 48 to 72 hours pending clinical course              Patient currently not medically stable for discharge  Consultants:  None  Procedures:  None  Antimicrobials:  None  Subjective: No acute issues or events overnight, respiratory status improving, lower extremity edema improving -neither of which have resolved.  Objective: Vitals:   03/09/23 2015 03/10/23 0015 03/10/23 0457 03/10/23 0721  BP: (!) 145/80 130/72 139/79 (!) 143/66  Pulse: 70 72 66 69  Resp: 16 18 17 16   Temp: 98.2 F (36.8 C) 98.2 F (36.8 C) 97.9 F (36.6 C) 98.3 F (36.8 C)  TempSrc: Oral Oral Oral Oral  SpO2: 100% 99% 99% 99%  Weight:   96.4 kg   Height:        Intake/Output Summary (Last 24 hours) at 03/10/2023 0755 Last data filed at 03/10/2023 0458 Gross per 24 hour  Intake 1697 ml  Output 1575 ml  Net 122 ml   Filed Weights   03/08/23 1304 03/09/23 0435 03/10/23 0457  Weight: 93.6 kg 94.4 kg 96.4 kg    Examination:  General:  Pleasantly resting in bed,  No acute distress. Lungs:  Clear to auscultate bilaterally without rhonchi, wheeze, or rales. Heart:  Regular rate and rhythm.  Without murmurs, rubs, or gallops. Abdomen:  Soft, nontender, nondistended.  Without guarding or rebound. Extremities: 2+ pitting edema to the knee bilaterally Skin:  Warm and dry, no erythema.   LOS: 2 days   Time spent:  Azucena Fallen, DO [facility]  If 7PM-7AM, please contact  night-coverage [email or web address]  03/10/2023, 7:55 AM

## 2023-03-10 NOTE — Discharge Instructions (Signed)

## 2023-03-10 NOTE — Progress Notes (Signed)
Nutrition Quick Note:   The pt reports that she has a great appetite and has been eating well since admission. Pt states that she is familiar with heart healthy diet and declines further education at this time. RD will still attach handouts to discharge paperwork.   Bethann Humble, RD, LDN, CNSC.

## 2023-03-11 DIAGNOSIS — F1410 Cocaine abuse, uncomplicated: Secondary | ICD-10-CM | POA: Diagnosis not present

## 2023-03-11 DIAGNOSIS — N184 Chronic kidney disease, stage 4 (severe): Secondary | ICD-10-CM | POA: Diagnosis not present

## 2023-03-11 DIAGNOSIS — I5023 Acute on chronic systolic (congestive) heart failure: Secondary | ICD-10-CM | POA: Diagnosis not present

## 2023-03-11 DIAGNOSIS — I5033 Acute on chronic diastolic (congestive) heart failure: Secondary | ICD-10-CM | POA: Diagnosis not present

## 2023-03-11 LAB — BASIC METABOLIC PANEL
Anion gap: 11 (ref 5–15)
BUN: 44 mg/dL — ABNORMAL HIGH (ref 6–20)
CO2: 17 mmol/L — ABNORMAL LOW (ref 22–32)
Calcium: 8.2 mg/dL — ABNORMAL LOW (ref 8.9–10.3)
Chloride: 101 mmol/L (ref 98–111)
Creatinine, Ser: 4.03 mg/dL — ABNORMAL HIGH (ref 0.44–1.00)
GFR, Estimated: 12 mL/min — ABNORMAL LOW (ref >60)
Glucose, Bld: 114 mg/dL — ABNORMAL HIGH (ref 70–99)
Potassium: 4.2 mmol/L (ref 3.5–5.1)
Sodium: 129 mmol/L — ABNORMAL LOW (ref 135–145)

## 2023-03-11 LAB — GLUCOSE, CAPILLARY
Glucose-Capillary: 97 mg/dL (ref 70–99)
Glucose-Capillary: 93 mg/dL (ref 70–99)
Glucose-Capillary: 158 mg/dL — ABNORMAL HIGH (ref 70–99)

## 2023-03-11 LAB — CBC
HCT: 24.6 % — ABNORMAL LOW (ref 36.0–46.0)
Hemoglobin: 7.9 g/dL — ABNORMAL LOW (ref 12.0–15.0)
MCH: 28.7 pg (ref 26.0–34.0)
MCHC: 32.1 g/dL (ref 30.0–36.0)
MCV: 89.5 fL (ref 80.0–100.0)
Platelets: 175 10*3/uL (ref 150–400)
RBC: 2.75 MIL/uL — ABNORMAL LOW (ref 3.87–5.11)
RDW: 14.6 % (ref 11.5–15.5)
WBC: 4.3 10*3/uL (ref 4.0–10.5)
nRBC: 0 % (ref 0.0–0.2)

## 2023-03-11 MED ORDER — ATORVASTATIN CALCIUM 80 MG PO TABS: 80.0000 mg | ORAL_TABLET | Freq: Every day | ORAL | 0 refills | Status: AC

## 2023-03-11 MED ORDER — FUROSEMIDE 40 MG PO TABS: 40.0000 mg | ORAL_TABLET | Freq: Two times a day (BID) | ORAL | Status: DC

## 2023-03-11 MED ORDER — GABAPENTIN 100 MG PO CAPS: 100.0000 mg | ORAL_CAPSULE | Freq: Every day | ORAL | 0 refills | Status: DC

## 2023-03-11 NOTE — TOC Transition Note (Signed)
Transition of Care [facility]) - CM/SW Discharge Note   Patient Details  Name: Jenne Sellinger MRN: [id number] Date of Birth: Jun 22, 1961  Transition of Care [facility] CM/SW Contact:  Leone Haven, RN Phone Number: 03/11/2023, 4:34 PM   Clinical Narrative:    Patient is for dc today, she has no needs. Will have follow up apt on AVS with [facility], Secretary will make apt.     Barriers to Discharge: Continued Medical Work up   Patient Goals and CMS Choice   Choice offered to / list presented to : NA  Discharge Placement                         Discharge Plan and Services Additional resources added to the After Visit Summary for   In-house Referral: NA Discharge Planning Services: CM Consult Post Acute Care Choice: NA          DME Arranged: N/A DME Agency: NA       HH Arranged: NA          Social Determinants of Health (SDOH) Interventions SDOH Screenings   Food Insecurity: No Food Insecurity (03/08/2023)  Housing: Low Risk  (03/08/2023)  Transportation Needs: No Transportation Needs (03/08/2023)  Utilities: Not At Risk (03/08/2023)  Financial Resource Strain: Low Risk  (04/04/2022)  Tobacco Use: High Risk (03/07/2023)     Readmission Risk Interventions    03/27/2022   10:36 AM  Readmission Risk Prevention Plan  Transportation Screening Complete  Medication Review (RN Care Manager) Complete  PCP or Specialist appointment within 3-5 days of discharge Complete  HRI or Home Care Consult Complete  SW Recovery Care/Counseling Consult Complete  Palliative Care Screening Not Applicable  Skilled Nursing Facility Not Applicable

## 2023-03-11 NOTE — Progress Notes (Signed)
Heart Failure Navigator Progress Note  Assessed for Heart & Vascular TOC clinic readiness.  Patient does not meet criteria due to Advanced Heart Failure Team patient.   Navigator will sign off at this time.    Dawn Fields, BSN, RN Heart Failure Nurse Navigator Secure Chat Only   

## 2023-03-11 NOTE — Plan of Care (Signed)

## 2023-03-11 NOTE — Plan of Care (Signed)
Problem: Education: Goal: Ability to describe self-care measures that may prevent or decrease complications (Diabetes Survival Skills Education) will improve 03/11/2023 1602 by Louanne Belton, RN Outcome: Progressing 03/11/2023 0916 by Louanne Belton, RN Outcome: Progressing Goal: Individualized Educational Video(s) 03/11/2023 1602 by Louanne Belton, RN Outcome: Progressing 03/11/2023 0916 by Louanne Belton, RN Outcome: Progressing   Problem: Coping: Goal: Ability to adjust to condition or change in health will improve 03/11/2023 1602 by Louanne Belton, RN Outcome: Progressing 03/11/2023 0916 by Louanne Belton, RN Outcome: Progressing   Problem: Fluid Volume: Goal: Ability to maintain a balanced intake and output will improve 03/11/2023 1602 by Louanne Belton, RN Outcome: Progressing 03/11/2023 0916 by Louanne Belton, RN Outcome: Progressing   Problem: Health Behavior/Discharge Planning: Goal: Ability to identify and utilize available resources and services will improve 03/11/2023 1602 by Louanne Belton, RN Outcome: Progressing 03/11/2023 0916 by Louanne Belton, RN Outcome: Progressing Goal: Ability to manage health-related needs will improve 03/11/2023 1602 by Louanne Belton, RN Outcome: Progressing 03/11/2023 0916 by Louanne Belton, RN Outcome: Progressing   Problem: Metabolic: Goal: Ability to maintain appropriate glucose levels will improve 03/11/2023 1602 by Louanne Belton, RN Outcome: Progressing 03/11/2023 0916 by Louanne Belton, RN Outcome: Progressing   Problem: Nutritional: Goal: Maintenance of adequate nutrition will improve 03/11/2023 1602 by Louanne Belton, RN Outcome: Progressing 03/11/2023 0916 by Louanne Belton, RN Outcome: Progressing Goal: Progress toward achieving an optimal weight will improve 03/11/2023 1602 by Louanne Belton, RN Outcome: Progressing 03/11/2023 0916 by Louanne Belton, RN Outcome: Progressing   Problem: Skin  Integrity: Goal: Risk for impaired skin integrity will decrease 03/11/2023 1602 by Louanne Belton, RN Outcome: Progressing 03/11/2023 0916 by Louanne Belton, RN Outcome: Progressing   Problem: Tissue Perfusion: Goal: Adequacy of tissue perfusion will improve 03/11/2023 1602 by Louanne Belton, RN Outcome: Progressing 03/11/2023 0916 by Louanne Belton, RN Outcome: Progressing   Problem: Education: Goal: Knowledge of General Education information will improve Description: Including pain rating scale, medication(s)/side effects and non-pharmacologic comfort measures 03/11/2023 1602 by Louanne Belton, RN Outcome: Progressing 03/11/2023 0916 by Louanne Belton, RN Outcome: Progressing   Problem: Health Behavior/Discharge Planning: Goal: Ability to manage health-related needs will improve 03/11/2023 1602 by Louanne Belton, RN Outcome: Progressing 03/11/2023 0916 by Louanne Belton, RN Outcome: Progressing   Problem: Clinical Measurements: Goal: Ability to maintain clinical measurements within normal limits will improve 03/11/2023 1602 by Louanne Belton, RN Outcome: Progressing 03/11/2023 0916 by Louanne Belton, RN Outcome: Progressing Goal: Will remain free from infection 03/11/2023 1602 by Louanne Belton, RN Outcome: Progressing 03/11/2023 0916 by Louanne Belton, RN Outcome: Progressing Goal: Diagnostic test results will improve 03/11/2023 1602 by Louanne Belton, RN Outcome: Progressing 03/11/2023 0916 by Louanne Belton, RN Outcome: Progressing Goal: Respiratory complications will improve 03/11/2023 1602 by Louanne Belton, RN Outcome: Progressing 03/11/2023 0916 by Louanne Belton, RN Outcome: Progressing Goal: Cardiovascular complication will be avoided 03/11/2023 1602 by Louanne Belton, RN Outcome: Progressing 03/11/2023 0916 by Louanne Belton, RN Outcome: Progressing   Problem: Activity: Goal: Risk for activity intolerance will decrease 03/11/2023 1602 by Louanne Belton, RN Outcome: Progressing 03/11/2023 0916 by Louanne Belton, RN Outcome: Progressing   Problem: Nutrition: Goal: Adequate nutrition will be maintained 03/11/2023 1602 by Louanne Belton, RN Outcome: Progressing 03/11/2023 0916 by Louanne Belton, RN Outcome: Progressing  Problem: Coping: Goal: Level of anxiety will decrease 03/11/2023 1602 by Louanne Belton, RN Outcome: Progressing 03/11/2023 0916 by Louanne Belton, RN Outcome: Progressing   Problem: Elimination: Goal: Will not experience complications related to bowel motility 03/11/2023 1602 by Louanne Belton, RN Outcome: Progressing 03/11/2023 0916 by Louanne Belton, RN Outcome: Progressing Goal: Will not experience complications related to urinary retention 03/11/2023 1602 by Louanne Belton, RN Outcome: Progressing 03/11/2023 0916 by Louanne Belton, RN Outcome: Progressing   Problem: Pain Managment: Goal: General experience of comfort will improve 03/11/2023 1602 by Louanne Belton, RN Outcome: Progressing 03/11/2023 0916 by Louanne Belton, RN Outcome: Progressing   Problem: Safety: Goal: Ability to remain free from injury will improve 03/11/2023 1602 by Louanne Belton, RN Outcome: Progressing 03/11/2023 0916 by Louanne Belton, RN Outcome: Progressing   Problem: Skin Integrity: Goal: Risk for impaired skin integrity will decrease 03/11/2023 1602 by Louanne Belton, RN Outcome: Progressing 03/11/2023 0916 by Louanne Belton, RN Outcome: Progressing

## 2023-03-11 NOTE — Discharge Summary (Signed)
Physician Discharge Summary  Carol [name] RUE:[id number] DOB: [birth year] DOA: 03/07/2023  PCP: System, Provider Not In  Admit date: 03/07/2023 Discharge date: 03/11/2023  Admitted From: Home Disposition: Home  Recommendations for Outpatient Follow-up:  Follow up with PCP in 1-2 weeks Follow-up with new nephrologist as scheduled Follow-up with cardiology as scheduled  Home Health: None Equipment/Devices: None  Discharge Condition: Stable CODE STATUS: Full Diet recommendation: Low-salt low-fat fluid restricted 1.5 L diet  Brief/Interim Summary: Carol [name] is a 62 y.o. female with medical history significant of COPD, hypertension, diabetes, CKD stage IV, CHF, cocaine use disorder.  Presents to the hospital with bilateral lower extremity edema worsening over the past week with recent evaluation by PCP who stated "kidneys are shutting down."  Hospitalist called for admission.   Patient admitted with acute on chronic heart failure and exacerbation with profound volume overload.  Appears to be secondary to noncompliance with dietary recommendations, lifestyle recommendations and questionably medication compliance.  Patient tested positive for cocaine at intake with elevated blood pressure worsening creatinine from baseline over the past year with 2+ pitting edema bilateral lower extremities with profound dyspnea with exertion.  Over the past 48 hours patient has been aggressively diuresed with minimal increase in creatinine.  Nephrology([facility]) was sidelined, plan to follow-up outpatient as soon as schedule allows.  Hopefully given multiple episodes of education the patient's diet will improve as well her volume status.  Will continue patient at previous Lasix dosing in hopes that she is more compliant with her diet and discontinues any other illicit substances including but not limited to cocaine.  She has follow-up with heart failure team already scheduled, would  continue that appointment pending nephrology evaluation for further discussion on diuretics.  Discharge Diagnoses:  Principal Problem:   Volume overload Active Problems:   CKD (chronic kidney disease) stage 4, GFR 15-29 ml/min (HCC)   Acute on chronic diastolic (congestive) heart failure (HCC)   Diabetes mellitus (HCC)   Essential hypertension   Cocaine use disorder, mild, abuse (HCC)   Acute on chronic systolic (congestive) heart failure (HCC)  Acute on chronic HFpEF -Updated echo shows EF 60 to 65% with grade 2 diastolic dysfunction asymmetrical left ventricular hypertrophy at the basal septal segment.  Elevated pulmonary artery systolic pressure. -Recommend strict diet Daily weights at home  CKD IV -Baseline quite labile ranging from 2-4 over the past year -currently at high and normal for the patient 4.03 -Nephrology to follow-up outpatient as discussed   Hypertension, BP is not at goal, elevated -Continue amlodipine, hydralazine, isosorbide, nebivolol -Currently well-controlled   Type 2 diabetes controlled Last A1C 6.2 on 11/01/22 Continue sliding scale insulin, hypoglycemic protocol   Diabetic polyneuropathy Continue home dose gabapentin   Illicit substance use/abuse -Lengthy discussion with patient about ongoing use of illicit substances, most notably cocaine given her history of heart failure advanced CKD and hypertension this drug would most certainly exacerbate her current situation.  We discussed ongoing use and abuse would likely advance patient towards dialysis.    Discharge Instructions   Allergies as of 03/11/2023       Reactions   Penicillins Shortness Of Breath, Other (See Comments)   Caused yeast infection Has patient had a PCN reaction causing immediate rash, facial/tongue/throat swelling, SOB or lightheadedness with hypotension: Yes Has patient had a PCN reaction causing severe rash involving mucus membranes or skin necrosis: No Has patient had a PCN  reaction that required hospitalization pt was in the hospital at the time  of the reaction Has patient had a PCN reaction occurring within the last 10 years: No If all of the above answers are "NO", then may proceed with Cephalosp   Ultram [tramadol] Other (See Comments)   Made her tongue raw        Medication List     TAKE these medications    albuterol 108 (90 Base) MCG/ACT inhaler Commonly known as: VENTOLIN HFA Inhale 2 puffs into the lungs every 4 (four) hours as needed for wheezing or shortness of breath.   amLODipine 10 MG tablet Commonly known as: NORVASC Take 1 tablet (10 mg total) by mouth daily.   Aspirin Low Dose 81 MG tablet Generic drug: aspirin EC Take 1 tablet (81 mg total) by mouth daily. Swallow whole.   atorvastatin 80 MG tablet Commonly known as: LIPITOR Take 1 tablet (80 mg total) by mouth daily.   budesonide-formoterol 160-4.5 MCG/ACT inhaler Commonly known as: SYMBICORT Inhale 2 puffs into the lungs every morning.   cetirizine 10 MG tablet Commonly known as: ZYRTEC Take 10 mg by mouth daily as needed for allergies.   ergocalciferol 1.25 MG (50000 UT) capsule Commonly known as: VITAMIN D2 Take 1 capsule (50,000 Units total) by mouth once a week. Wednesday   Eszopiclone 3 MG Tabs Take 3 mg by mouth at bedtime. Take immediately before bedtime   furosemide 40 MG tablet Commonly known as: LASIX Take 40 mg by mouth daily.   gabapentin 100 MG capsule Commonly known as: NEURONTIN Take 1 capsule (100 mg total) by mouth at bedtime.   hydrALAZINE 50 MG tablet Commonly known as: APRESOLINE Take 1 tablet (50 mg total) by mouth 3 (three) times daily.   isosorbide mononitrate 30 MG 24 hr tablet Commonly known as: IMDUR Take 0.5 tablets (15 mg total) by mouth daily.   melatonin 5 MG Tabs Take 1 tablet (5 mg total) by mouth at bedtime as needed (sleep).   mirtazapine 7.5 MG tablet Commonly known as: REMERON Take 1 tablet (7.5 mg total) by mouth  at bedtime.   nebivolol 10 MG tablet Commonly known as: BYSTOLIC Take 1 tablet (10 mg total) by mouth daily.   nitroGLYCERIN 0.4 MG SL tablet Commonly known as: NITROSTAT Place 1 tablet (0.4 mg total) under the tongue every 5 (five) minutes as needed for chest pain.   pantoprazole 40 MG tablet Commonly known as: PROTONIX Take 1 tablet (40 mg total) by mouth daily at 12 noon.   Tradjenta 5 MG Tabs tablet Generic drug: linagliptin Take 1 tablet (5 mg total) by mouth daily.        Allergies  Allergen Reactions   Penicillins Shortness Of Breath and Other (See Comments)    Caused yeast infection Has patient had a PCN reaction causing immediate rash, facial/tongue/throat swelling, SOB or lightheadedness with hypotension: Yes Has patient had a PCN reaction causing severe rash involving mucus membranes or skin necrosis: No Has patient had a PCN reaction that required hospitalization pt was in the hospital at the time of the reaction Has patient had a PCN reaction occurring within the last 10 years: No If all of the above answers are "NO", then may proceed with Cephalosp   Ultram [Tramadol] Other (See Comments)    Made her tongue raw    Consultations: None   Procedures/Studies: ECHOCARDIOGRAM COMPLETE  Result Date: 03/08/2023    ECHOCARDIOGRAM REPORT   Patient Name:   Carol [name] Date of Exam: 03/08/2023 Medical Rec #:  [id number]  Height:       64.0 in Accession #:    [id number]              Weight:       182.1 lb Date of Birth:  [birth year]               BSA:          1.880 m Patient Age:    62 years                BP:           189/91 mmHg Patient Gender: F                       HR:           68 bpm. Exam Location:  Inpatient Procedure: 2D Echo, Cardiac Doppler and Color Doppler Indications:    CHF- Acute Diastolic  History:        Patient has prior history of Echocardiogram examinations, most                 recent 12/20/2021. CHF, NSTEMI; Risk  Factors:Diabetes and                 Hypertension.  Sonographer:    Raeford Razor Referring Phys: [phone] JARED M GARDNER  Sonographer Comments: Technically difficult study due to poor echo windows. IMPRESSIONS  1. Left ventricular ejection fraction, by estimation, is 60 to 65%. The left ventricle has normal function. The left ventricle has no regional wall motion abnormalities. There is mild asymmetric left ventricular hypertrophy of the basal-septal segment. Left ventricular diastolic parameters are consistent with Grade II diastolic dysfunction (pseudonormalization).  2. Right ventricular systolic function is normal. The right ventricular size is normal. There is moderately elevated pulmonary artery systolic pressure. The estimated right ventricular systolic pressure is 49.5 mmHg.  3. Left atrial size was moderately dilated.  4. The mitral valve is normal in structure. Mild mitral valve regurgitation. No evidence of mitral stenosis.  5. The aortic valve is tricuspid. There is mild calcification of the aortic valve. Aortic valve regurgitation is trivial. Aortic valve sclerosis/calcification is present, without any evidence of aortic stenosis.  6. The inferior vena cava is normal in size with <50% respiratory variability, suggesting right atrial pressure of 8 mmHg. FINDINGS  Left Ventricle: Left ventricular ejection fraction, by estimation, is 60 to 65%. The left ventricle has normal function. The left ventricle has no regional wall motion abnormalities. The left ventricular internal cavity size was normal in size. There is  mild asymmetric left ventricular hypertrophy of the basal-septal segment. Left ventricular diastolic parameters are consistent with Grade II diastolic dysfunction (pseudonormalization). Right Ventricle: The right ventricular size is normal. No increase in right ventricular wall thickness. Right ventricular systolic function is normal. There is moderately elevated pulmonary artery systolic pressure.  The tricuspid regurgitant velocity is 3.22 m/s, and with an assumed right atrial pressure of 8 mmHg, the estimated right ventricular systolic pressure is 49.5 mmHg. Left Atrium: Left atrial size was moderately dilated. Right Atrium: Right atrial size was normal in size. Pericardium: There is no evidence of pericardial effusion. Mitral Valve: The mitral valve is normal in structure. Mild mitral valve regurgitation. No evidence of mitral valve stenosis. Tricuspid Valve: The tricuspid valve is normal in structure. Tricuspid valve regurgitation is mild . No evidence of tricuspid stenosis. Aortic Valve: The aortic valve is tricuspid. There is mild calcification of the aortic valve.  Aortic valve regurgitation is trivial. Aortic valve sclerosis/calcification is present, without any evidence of aortic stenosis. Aortic valve peak gradient measures 4.4 mmHg. Pulmonic Valve: The pulmonic valve was not well visualized. Pulmonic valve regurgitation is not visualized. No evidence of pulmonic stenosis. Aorta: The aortic root is normal in size and structure. Venous: The inferior vena cava is normal in size with less than 50% respiratory variability, suggesting right atrial pressure of 8 mmHg. IAS/Shunts: No atrial level shunt detected by color flow Doppler.  LEFT VENTRICLE PLAX 2D LVIDd:         4.90 cm   Diastology LVIDs:         3.30 cm   LV e' medial:    4.57 cm/s LV PW:         1.00 cm   LV E/e' medial:  31.9 LV IVS:        1.00 cm   LV e' lateral:   7.40 cm/s LVOT diam:     1.70 cm   LV E/e' lateral: 19.7 LV SV:         39 LV SV Index:   21 LVOT Area:     2.27 cm  RIGHT VENTRICLE             IVC RV Basal diam:  3.00 cm     IVC diam: 1.80 cm RV S prime:     12.50 cm/s TAPSE (M-mode): 1.5 cm LEFT ATRIUM             Index        RIGHT ATRIUM           Index LA diam:        4.20 cm 2.23 cm/m   RA Area:     11.80 cm LA Vol (A2C):   69.5 ml 36.97 ml/m  RA Volume:   25.80 ml  13.72 ml/m LA Vol (A4C):   78.0 ml 41.49 ml/m LA  Biplane Vol: 78.0 ml 41.49 ml/m  AORTIC VALVE AV Area (Vmax): 1.71 cm AV Vmax:        105.00 cm/s AV Peak Grad:   4.4 mmHg LVOT Vmax:      79.10 cm/s LVOT Vmean:     50.900 cm/s LVOT VTI:       0.172 m  AORTA Ao Root diam: 3.20 cm MITRAL VALVE                TRICUSPID VALVE MV Area (PHT): 3.99 cm     TR Peak grad:   41.5 mmHg MV Decel Time: 190 msec     TR Vmax:        322.00 cm/s MR Peak grad: 85.7 mmHg MR Vmax:      463.00 cm/s   SHUNTS MV E velocity: 146.00 cm/s  Systemic VTI:  0.17 m MV A velocity: 67.00 cm/s   Systemic Diam: 1.70 cm MV E/A ratio:  2.18 Arvilla Meres MD Electronically signed by Arvilla Meres MD Signature Date/Time: 03/08/2023/9:03:40 AM    Final    DG Chest Port 1 View  Result Date: 03/07/2023 CLINICAL DATA:  Fluid overload EXAM: PORTABLE CHEST 1 VIEW COMPARISON:  07/21/2022 FINDINGS: The lungs are symmetrically well inflated. There is central pulmonary vascular congestion with pulmonary vascular redistribution of the lung apices in keeping with changes of early cardiogenic failure or volume overload. Bibasilar somewhat asymmetric peribronchial infiltrates have developed which may reflect changes of edema or airway inflammation. No pneumothorax or pleural effusion. Stable cardiomegaly. No acute bone abnormality.  IMPRESSION: 1. Cardiomegaly with early cardiogenic failure or volume overload. 2. Bibasilar peribronchial infiltrates which may reflect changes of edema or airway inflammation. Electronically Signed   By: Helyn Numbers M.D.   On: 03/07/2023 19:40     Subjective: No acute issues or events overnight feels markedly improved from previous requesting discharge home which is reasonable given her appropriate diuresis and otherwise stable labs   Discharge Exam: Vitals:   03/11/23 0819 03/11/23 1240  BP:  132/71  Pulse: 75 72  Resp: 16 18  Temp:  98.1 F (36.7 C)  SpO2: 97% 99%   Vitals:   03/11/23 0355 03/11/23 0750 03/11/23 0819 03/11/23 1240  BP: (!) 150/81 (!)  146/76  132/71  Pulse: 64 68 75 72  Resp: 18 18 16 18   Temp: (!) 97.1 F (36.2 C) 97.8 F (36.6 C)  98.1 F (36.7 C)  TempSrc: Axillary Oral  Oral  SpO2: 99% 98% 97% 99%  Weight: 97.5 kg     Height:        General: Pt is alert, awake, not in acute distress Cardiovascular: RRR, S1/S2 +, no rubs, no gallops Respiratory: CTA bilaterally, no wheezing, no rhonchi Abdominal: Soft, NT, ND, bowel sounds + Extremities: no edema, no cyanosis    The results of significant diagnostics from this hospitalization (including imaging, microbiology, ancillary and laboratory) are listed below for reference.     Microbiology: No results found for this or any previous visit (from the past 240 hour(s)).   Labs: BNP (last 3 results) Recent Labs    12/17/22 1535 01/07/23 1429 03/07/23 1526  BNP 187.9* 1,461.8* 2,525.3*   Basic Metabolic Panel: Recent Labs  Lab 03/07/23 1526 03/08/23 0517 03/08/23 1643 03/10/23 0148 03/11/23 0025  NA 135 136  --  133* 129*  K 3.6 3.5  --  4.1 4.2  CL 104 108  --  109 101  CO2 16* 18*  --  18* 17*  GLUCOSE 148* 100*  --  109* 114*  BUN 39* 37*  --  43* 44*  CREATININE 3.48* 3.36*  --  3.85* 4.03*  CALCIUM 8.0* 7.9*  --  8.1* 8.2*  MG  --   --  1.6*  --   --    Liver Function Tests: Recent Labs  Lab 03/08/23 1643  AST 18  ALT 11  ALKPHOS 128*  BILITOT 0.2*  PROT 5.8*  ALBUMIN 2.0*   No results for input(s): "LIPASE", "AMYLASE" in the last 168 hours. No results for input(s): "AMMONIA" in the last 168 hours. CBC: Recent Labs  Lab 03/07/23 1526 03/08/23 0517 03/10/23 0148 03/11/23 0025  WBC 6.1 5.9 5.2 4.3  NEUTROABS 3.9  --   --   --   HGB 9.0* 8.4* 8.1* 7.9*  HCT 27.5* 26.0* 24.6* 24.6*  MCV 91.1 88.7 88.5 89.5  PLT 194 180 212 175   Cardiac Enzymes: No results for input(s): "CKTOTAL", "CKMB", "CKMBINDEX", "TROPONINI" in the last 168 hours. BNP: Invalid input(s): "POCBNP" CBG: Recent Labs  Lab 03/10/23 1654 03/10/23 2115  03/11/23 0559 03/11/23 1150 03/11/23 1545  GLUCAP 117* 135* 97 93 158*   D-Dimer No results for input(s): "DDIMER" in the last 72 hours. Hgb A1c No results for input(s): "HGBA1C" in the last 72 hours. Lipid Profile No results for input(s): "CHOL", "HDL", "LDLCALC", "TRIG", "CHOLHDL", "LDLDIRECT" in the last 72 hours. Thyroid function studies No results for input(s): "TSH", "T4TOTAL", "T3FREE", "THYROIDAB" in the last 72 hours.  Invalid input(s): "FREET3" Anemia work  up No results for input(s): "VITAMINB12", "FOLATE", "FERRITIN", "TIBC", "IRON", "RETICCTPCT" in the last 72 hours. Urinalysis    Component Value Date/Time   COLORURINE YELLOW 03/08/2023 0517   APPEARANCEUR HAZY (A) 03/08/2023 0517   LABSPEC 1.007 03/08/2023 0517   PHURINE 7.0 03/08/2023 0517   GLUCOSEU NEGATIVE 03/08/2023 0517   HGBUR NEGATIVE 03/08/2023 0517   BILIRUBINUR NEGATIVE 03/08/2023 0517   KETONESUR NEGATIVE 03/08/2023 0517   PROTEINUR >=300 (A) 03/08/2023 0517   UROBILINOGEN 0.2 01/29/2014 2116   NITRITE NEGATIVE 03/08/2023 0517   LEUKOCYTESUR TRACE (A) 03/08/2023 0517   Sepsis Labs Recent Labs  Lab 03/07/23 1526 03/08/23 0517 03/10/23 0148 03/11/23 0025  WBC 6.1 5.9 5.2 4.3   Microbiology No results found for this or any previous visit (from the past 240 hour(s)).   Time coordinating discharge: Over 30 minutes  SIGNED:   Azucena Fallen, DO [facility] 03/11/2023, 4:01 PM Pager   If 7PM-7AM, please contact night-coverage [email or web address]

## 2023-03-12 ENCOUNTER — Encounter (HOSPITAL_COMMUNITY): Payer: 59

## 2023-03-22 ENCOUNTER — Encounter (HOSPITAL_COMMUNITY): Payer: Self-pay

## 2023-03-22 ENCOUNTER — Emergency Department (HOSPITAL_COMMUNITY): Payer: 59

## 2023-03-22 ENCOUNTER — Other Ambulatory Visit: Payer: Self-pay

## 2023-03-22 ENCOUNTER — Inpatient Hospital Stay (HOSPITAL_COMMUNITY)
Admission: EM | Admit: 2023-03-22 | Discharge: 2023-03-29 | DRG: 291 | Disposition: A | Discharge status: Home Health | Payer: 59 | Attending: Internal Medicine | Admitting: Internal Medicine

## 2023-03-22 DIAGNOSIS — J449 Chronic obstructive pulmonary disease, unspecified: Secondary | ICD-10-CM | POA: Diagnosis present

## 2023-03-22 DIAGNOSIS — Z823 Family history of stroke: Secondary | ICD-10-CM

## 2023-03-22 DIAGNOSIS — F32A Depression, unspecified: Secondary | ICD-10-CM | POA: Diagnosis present

## 2023-03-22 DIAGNOSIS — N184 Chronic kidney disease, stage 4 (severe): Secondary | ICD-10-CM | POA: Diagnosis present

## 2023-03-22 DIAGNOSIS — E871 Hypo-osmolality and hyponatremia: Secondary | ICD-10-CM | POA: Diagnosis not present

## 2023-03-22 DIAGNOSIS — Z7982 Long term (current) use of aspirin: Secondary | ICD-10-CM

## 2023-03-22 DIAGNOSIS — I509 Heart failure, unspecified: Secondary | ICD-10-CM

## 2023-03-22 DIAGNOSIS — Z7984 Long term (current) use of oral hypoglycemic drugs: Secondary | ICD-10-CM

## 2023-03-22 DIAGNOSIS — D509 Iron deficiency anemia, unspecified: Secondary | ICD-10-CM | POA: Diagnosis present

## 2023-03-22 DIAGNOSIS — Z841 Family history of disorders of kidney and ureter: Secondary | ICD-10-CM

## 2023-03-22 DIAGNOSIS — I5033 Acute on chronic diastolic (congestive) heart failure: Secondary | ICD-10-CM | POA: Diagnosis present

## 2023-03-22 DIAGNOSIS — M7989 Other specified soft tissue disorders: Secondary | ICD-10-CM | POA: Diagnosis present

## 2023-03-22 DIAGNOSIS — Z8249 Family history of ischemic heart disease and other diseases of the circulatory system: Secondary | ICD-10-CM

## 2023-03-22 DIAGNOSIS — F419 Anxiety disorder, unspecified: Secondary | ICD-10-CM | POA: Diagnosis present

## 2023-03-22 DIAGNOSIS — E785 Hyperlipidemia, unspecified: Secondary | ICD-10-CM | POA: Diagnosis present

## 2023-03-22 DIAGNOSIS — I10 Essential (primary) hypertension: Secondary | ICD-10-CM | POA: Diagnosis present

## 2023-03-22 DIAGNOSIS — Z885 Allergy status to narcotic agent status: Secondary | ICD-10-CM

## 2023-03-22 DIAGNOSIS — I130 Hypertensive heart and chronic kidney disease with heart failure and stage 1 through stage 4 chronic kidney disease, or unspecified chronic kidney disease: Secondary | ICD-10-CM | POA: Diagnosis present

## 2023-03-22 DIAGNOSIS — Z1152 Encounter for screening for COVID-19: Secondary | ICD-10-CM

## 2023-03-22 DIAGNOSIS — D631 Anemia in chronic kidney disease: Secondary | ICD-10-CM | POA: Diagnosis present

## 2023-03-22 DIAGNOSIS — Z79899 Other long term (current) drug therapy: Secondary | ICD-10-CM | POA: Diagnosis not present

## 2023-03-22 DIAGNOSIS — E8809 Other disorders of plasma-protein metabolism, not elsewhere classified: Secondary | ICD-10-CM | POA: Diagnosis present

## 2023-03-22 DIAGNOSIS — E1169 Type 2 diabetes mellitus with other specified complication: Secondary | ICD-10-CM | POA: Diagnosis present

## 2023-03-22 DIAGNOSIS — Z833 Family history of diabetes mellitus: Secondary | ICD-10-CM

## 2023-03-22 DIAGNOSIS — Z6837 Body mass index (BMI) 37.0-37.9, adult: Secondary | ICD-10-CM

## 2023-03-22 DIAGNOSIS — Z880 Allergy status to penicillin: Secondary | ICD-10-CM

## 2023-03-22 DIAGNOSIS — I252 Old myocardial infarction: Secondary | ICD-10-CM | POA: Diagnosis not present

## 2023-03-22 DIAGNOSIS — E669 Obesity, unspecified: Secondary | ICD-10-CM | POA: Diagnosis present

## 2023-03-22 DIAGNOSIS — Z7951 Long term (current) use of inhaled steroids: Secondary | ICD-10-CM

## 2023-03-22 DIAGNOSIS — F17210 Nicotine dependence, cigarettes, uncomplicated: Secondary | ICD-10-CM | POA: Diagnosis present

## 2023-03-22 DIAGNOSIS — N179 Acute kidney failure, unspecified: Secondary | ICD-10-CM | POA: Diagnosis present

## 2023-03-22 DIAGNOSIS — F1410 Cocaine abuse, uncomplicated: Secondary | ICD-10-CM | POA: Diagnosis present

## 2023-03-22 DIAGNOSIS — G20A1 Parkinson's disease without dyskinesia, without mention of fluctuations: Secondary | ICD-10-CM | POA: Diagnosis present

## 2023-03-22 DIAGNOSIS — I2510 Atherosclerotic heart disease of native coronary artery without angina pectoris: Secondary | ICD-10-CM | POA: Diagnosis present

## 2023-03-22 DIAGNOSIS — D869 Sarcoidosis, unspecified: Secondary | ICD-10-CM | POA: Diagnosis present

## 2023-03-22 DIAGNOSIS — K219 Gastro-esophageal reflux disease without esophagitis: Secondary | ICD-10-CM | POA: Diagnosis present

## 2023-03-22 DIAGNOSIS — R809 Proteinuria, unspecified: Secondary | ICD-10-CM | POA: Diagnosis present

## 2023-03-22 DIAGNOSIS — J42 Unspecified chronic bronchitis: Secondary | ICD-10-CM | POA: Diagnosis not present

## 2023-03-22 DIAGNOSIS — Z825 Family history of asthma and other chronic lower respiratory diseases: Secondary | ICD-10-CM

## 2023-03-22 DIAGNOSIS — E119 Type 2 diabetes mellitus without complications: Secondary | ICD-10-CM | POA: Insufficient documentation

## 2023-03-22 DIAGNOSIS — L299 Pruritus, unspecified: Secondary | ICD-10-CM | POA: Diagnosis not present

## 2023-03-22 DIAGNOSIS — E1122 Type 2 diabetes mellitus with diabetic chronic kidney disease: Secondary | ICD-10-CM | POA: Diagnosis present

## 2023-03-22 LAB — CBC WITH DIFFERENTIAL/PLATELET
Abs Immature Granulocytes: 0.03 10*3/uL (ref 0.00–0.07)
Basophils Absolute: 0 10*3/uL (ref 0.0–0.1)
Basophils Relative: 0 %
Eosinophils Absolute: 0.1 10*3/uL (ref 0.0–0.5)
Eosinophils Relative: 2 %
HCT: 28.9 % — ABNORMAL LOW (ref 36.0–46.0)
Hemoglobin: 9.2 g/dL — ABNORMAL LOW (ref 12.0–15.0)
Immature Granulocytes: 0 %
Lymphocytes Relative: 15 %
Lymphs Abs: 1.1 10*3/uL (ref 0.7–4.0)
MCH: 28.4 pg (ref 26.0–34.0)
MCHC: 31.8 g/dL (ref 30.0–36.0)
MCV: 89.2 fL (ref 80.0–100.0)
Monocytes Absolute: 0.9 10*3/uL (ref 0.1–1.0)
Monocytes Relative: 12 %
Neutro Abs: 5 10*3/uL (ref 1.7–7.7)
Neutrophils Relative %: 71 %
Platelets: 247 10*3/uL (ref 150–400)
RBC: 3.24 MIL/uL — ABNORMAL LOW (ref 3.87–5.11)
RDW: 13.6 % (ref 11.5–15.5)
WBC: 7.1 10*3/uL (ref 4.0–10.5)
nRBC: 0 % (ref 0.0–0.2)

## 2023-03-22 LAB — I-STAT VENOUS BLOOD GAS, ED
Acid-base deficit: 7 mmol/L — ABNORMAL HIGH (ref 0.0–2.0)
Bicarbonate: 18.3 mmol/L — ABNORMAL LOW (ref 20.0–28.0)
Calcium, Ion: 1.14 mmol/L — ABNORMAL LOW (ref 1.15–1.40)
HCT: 29 % — ABNORMAL LOW (ref 36.0–46.0)
Hemoglobin: 9.9 g/dL — ABNORMAL LOW (ref 12.0–15.0)
O2 Saturation: 81 %
Potassium: 3.9 mmol/L (ref 3.5–5.1)
Sodium: 136 mmol/L (ref 135–145)
TCO2: 19 mmol/L — ABNORMAL LOW (ref 22–32)
pCO2, Ven: 36.6 mmHg — ABNORMAL LOW (ref 44–60)
pH, Ven: 7.308 (ref 7.25–7.43)
pO2, Ven: 49 mmHg — ABNORMAL HIGH (ref 32–45)

## 2023-03-22 LAB — COMPREHENSIVE METABOLIC PANEL
ALT: 13 U/L (ref 0–44)
AST: 20 U/L (ref 15–41)
Albumin: 2.4 g/dL — ABNORMAL LOW (ref 3.5–5.0)
Alkaline Phosphatase: 145 U/L — ABNORMAL HIGH (ref 38–126)
Anion gap: 11 (ref 5–15)
BUN: 37 mg/dL — ABNORMAL HIGH (ref 6–20)
CO2: 17 mmol/L — ABNORMAL LOW (ref 22–32)
Calcium: 8.4 mg/dL — ABNORMAL LOW (ref 8.9–10.3)
Chloride: 107 mmol/L (ref 98–111)
Creatinine, Ser: 4 mg/dL — ABNORMAL HIGH (ref 0.44–1.00)
GFR, Estimated: 12 mL/min — ABNORMAL LOW (ref >60)
Glucose, Bld: 92 mg/dL (ref 70–99)
Potassium: 3.8 mmol/L (ref 3.5–5.1)
Sodium: 135 mmol/L (ref 135–145)
Total Bilirubin: 0.8 mg/dL (ref 0.3–1.2)
Total Protein: 6.4 g/dL — ABNORMAL LOW (ref 6.5–8.1)

## 2023-03-22 LAB — TROPONIN I (HIGH SENSITIVITY)
Troponin I (High Sensitivity): 17 ng/L (ref <18)
Troponin I (High Sensitivity): 17 ng/L (ref <18)

## 2023-03-22 LAB — PROTIME-INR
INR: 1.1 (ref 0.8–1.2)
Prothrombin Time: 14.8 seconds (ref 11.4–15.2)

## 2023-03-22 LAB — BRAIN NATRIURETIC PEPTIDE: B Natriuretic Peptide: 2437.3 pg/mL — ABNORMAL HIGH (ref 0.0–100.0)

## 2023-03-22 MED ORDER — OXYCODONE HCL 5 MG PO TABS
5.0000 mg | ORAL_TABLET | Freq: Four times a day (QID) | ORAL | Status: AC | PRN
  Administered 2023-03-22 (×2): 5 mg via ORAL
  Filled 2023-03-22 (×2): qty 1

## 2023-03-22 MED ORDER — IPRATROPIUM-ALBUTEROL 0.5-2.5 (3) MG/3ML IN SOLN
3.0000 mL | Freq: Four times a day (QID) | RESPIRATORY_TRACT | Status: DC
  Administered 2023-03-22 – 2023-03-23 (×3): 3 mL via RESPIRATORY_TRACT
  Filled 2023-03-22 (×3): qty 3

## 2023-03-22 MED ORDER — PROCHLORPERAZINE EDISYLATE 10 MG/2ML IJ SOLN
5.0000 mg | Freq: Four times a day (QID) | INTRAMUSCULAR | Status: DC | PRN
  Administered 2023-03-25 – 2023-03-28 (×2): 5 mg via INTRAVENOUS
  Filled 2023-03-22 (×2): qty 2

## 2023-03-22 MED ORDER — HYDROMORPHONE HCL 1 MG/ML IJ SOLN
0.5000 mg | INTRAMUSCULAR | Status: AC | PRN
  Administered 2023-03-23 – 2023-03-24 (×2): 0.5 mg via INTRAVENOUS
  Filled 2023-03-22 (×2): qty 0.5

## 2023-03-22 MED ORDER — ACETAMINOPHEN 325 MG PO TABS
650.0000 mg | ORAL_TABLET | Freq: Four times a day (QID) | ORAL | Status: DC | PRN
  Administered 2023-03-23 – 2023-03-25 (×2): 650 mg via ORAL
  Filled 2023-03-22 (×3): qty 2

## 2023-03-22 MED ORDER — MOMETASONE FURO-FORMOTEROL FUM 200-5 MCG/ACT IN AERO
2.0000 | INHALATION_SPRAY | Freq: Two times a day (BID) | RESPIRATORY_TRACT | Status: DC
  Administered 2023-03-23 – 2023-03-29 (×13): 2 via RESPIRATORY_TRACT
  Filled 2023-03-22 (×4): qty 8.8

## 2023-03-22 MED ORDER — HEPARIN SODIUM (PORCINE) 5000 UNIT/ML IJ SOLN
5000.0000 [IU] | Freq: Three times a day (TID) | INTRAMUSCULAR | Status: DC
  Administered 2023-03-22 – 2023-03-29 (×21): 5000 [IU] via SUBCUTANEOUS
  Filled 2023-03-22 (×20): qty 1

## 2023-03-22 MED ORDER — METHYLPREDNISOLONE SODIUM SUCC 40 MG IJ SOLR
40.0000 mg | Freq: Every day | INTRAMUSCULAR | Status: DC
  Administered 2023-03-22 – 2023-03-23 (×2): 40 mg via INTRAVENOUS
  Filled 2023-03-22 (×2): qty 1

## 2023-03-22 MED ORDER — FUROSEMIDE 10 MG/ML IJ SOLN
60.0000 mg | Freq: Once | INTRAMUSCULAR | Status: AC
  Administered 2023-03-22: 60 mg via INTRAVENOUS
  Filled 2023-03-22: qty 6

## 2023-03-22 MED ORDER — IPRATROPIUM-ALBUTEROL 0.5-2.5 (3) MG/3ML IN SOLN
3.0000 mL | Freq: Once | RESPIRATORY_TRACT | Status: AC
  Administered 2023-03-22: 3 mL via RESPIRATORY_TRACT
  Filled 2023-03-22: qty 3

## 2023-03-22 MED ORDER — MELATONIN 5 MG PO TABS
5.0000 mg | ORAL_TABLET | Freq: Every evening | ORAL | Status: DC | PRN
  Administered 2023-03-23 – 2023-03-25 (×2): 5 mg via ORAL
  Filled 2023-03-22 (×3): qty 1

## 2023-03-22 MED ORDER — GUAIFENESIN 100 MG/5ML PO LIQD
5.0000 mL | ORAL | Status: DC | PRN
  Administered 2023-03-22 – 2023-03-23 (×2): 5 mL via ORAL
  Filled 2023-03-22 (×2): qty 10

## 2023-03-22 MED ORDER — POLYETHYLENE GLYCOL 3350 17 G PO PACK
17.0000 g | PACK | Freq: Every day | ORAL | Status: DC | PRN
  Administered 2023-03-27 – 2023-03-28 (×2): 17 g via ORAL
  Filled 2023-03-22 (×2): qty 1

## 2023-03-22 MED ORDER — SODIUM CHLORIDE 0.9 % IV SOLN
100.0000 mg | Freq: Two times a day (BID) | INTRAVENOUS | Status: DC
  Administered 2023-03-22 – 2023-03-23 (×2): 100 mg via INTRAVENOUS
  Filled 2023-03-22 (×2): qty 100

## 2023-03-22 MED ORDER — ATORVASTATIN CALCIUM 80 MG PO TABS
80.0000 mg | ORAL_TABLET | Freq: Every day | ORAL | Status: DC
  Administered 2023-03-22 – 2023-03-29 (×8): 80 mg via ORAL
  Filled 2023-03-22 (×8): qty 1

## 2023-03-22 MED ORDER — FUROSEMIDE 10 MG/ML IJ SOLN
20.0000 mg | Freq: Two times a day (BID) | INTRAMUSCULAR | Status: DC
  Administered 2023-03-22 – 2023-03-23 (×2): 20 mg via INTRAVENOUS
  Filled 2023-03-22 (×2): qty 2

## 2023-03-22 NOTE — ED Provider Notes (Signed)
[place] EMERGENCY DEPARTMENT AT [facility] Provider Note  CSN: [id number] Arrival date & time: 03/22/23 1122  Chief Complaint(s) Shortness of Breath  HPI Carol [name] is a 62 y.o. female history of cocaine use, CHF, COPD presenting to the emergency department with shortness of breath.  Patient reports that she woke up this morning with significant shortness of breath.  Reports since the hospital she has had worsening leg swelling, also has continued to use cocaine.  Denies chest pain, fevers or chills, cough.  Reports wheezing.  Reports she has been otherwise compliant with her home medications.  Denies abdominal pain, nausea, vomiting, diarrhea.   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   Chronic back pain    Depression    Diabetes mellitus    GERD (gastroesophageal reflux disease)    Heart murmur    Hypertension    NSTEMI (non-ST elevated myocardial infarction) (HCC) 12/20/2021   Palpitations    Parkinson's disease    Patient Active Problem List   Diagnosis Date Noted   Acute on chronic systolic (congestive) heart failure (HCC) 03/08/2023   Volume overload 03/07/2023   Heme positive stool 11/06/2022   Anemia due to chronic blood loss 11/06/2022   Colon cancer screening 11/03/2022   Hypoglycemia 11/02/2022   MDD (major depressive disorder), recurrent episode, moderate (HCC) 11/02/2022   Abnormal barium swallow 11/02/2022   Esophageal stricture 11/02/2022   Esophageal dysmotility 11/02/2022   Antiplatelet or antithrombotic long-term use 11/02/2022   Chronic diastolic CHF (congestive heart failure) (HCC) 11/01/2022   Dysphagia 11/01/2022   Acute exacerbation of CHF (congestive heart failure) (HCC) 05/09/2022   High anion gap metabolic acidosis  05/09/2022   Acute on chronic diastolic (congestive) heart failure (HCC) 03/22/2022   COPD not affecting current episode of care 03/22/2022   Class 2 obesity due to excess calories with body mass index (BMI) of 37.0 to 37.9 in adult 03/22/2022   CHF (congestive heart failure) (HCC) 03/14/2022   borderline Prolonged QT interval 01/22/2022   Thyroid nodule 01/22/2022   Cocaine use disorder, mild, abuse (HCC) 01/02/2022   Diabetic hypoglycemia (HCC) 12/31/2021   CKD (chronic kidney disease) stage 4, GFR 15-29 ml/min (HCC) 12/31/2021   CAD in native artery 12/24/2021   Anxiety with depression 12/24/2021   Pure hypercholesterolemia    Diabetes mellitus (HCC) 01/29/2014   Essential hypertension 01/29/2014   Closed jaw fracture (HCC) 01/29/2014   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 Authorizing Provider  albuterol (PROVENTIL HFA;VENTOLIN HFA) 108 (90 Base) MCG/ACT inhaler Inhale 2 puffs into the lungs every 4 (four) hours as needed for wheezing or shortness of breath.    [provider]  amLODipine (NORVASC) 10 MG tablet Take 1 tablet (10 mg total) by mouth daily. 11/01/22   Jacklynn Ganong, FNP  aspirin EC 81 MG tablet Take 1 tablet (81 mg total) by mouth daily. Swallow whole. 05/11/22   Quincy Simmonds, MD  atorvastatin (LIPITOR) 80 MG tablet Take 1 tablet (80 mg total) by mouth daily. 03/11/23   Azucena Fallen, MD  budesonide-formoterol [facility]) 160-4.5 MCG/ACT inhaler Inhale 2 puffs into the lungs every morning. 11/09/21   [provider]  cetirizine (ZYRTEC) 10 MG tablet Take 10 mg by mouth daily as needed for allergies.    [provider]  ergocalciferol (VITAMIN D2) 1.25 MG (50000 UT) capsule Take 1 capsule (50,000 Units total) by mouth once a week. Wednesday 05/11/22   Quincy Simmonds, MD  eszopiclone 3 MG TABS Take 3 mg by mouth at bedtime. Take  immediately before bedtime    [provider]  furosemide (LASIX) 40 MG tablet Take 40 mg by mouth daily.    [provider]  gabapentin (NEURONTIN) 100 MG capsule Take 1 capsule (100 mg total) by mouth at bedtime. 03/11/23   Azucena Fallen, MD  hydrALAZINE (APRESOLINE) 50 MG tablet Take 1 tablet (50 mg total) by mouth 3 (three) times daily. 01/07/23 01/07/24  Laurey Morale, MD  isosorbide mononitrate (IMDUR) 30 MG 24 hr tablet Take 0.5 tablets (15 mg total) by mouth daily. 01/07/23   Laurey Morale, MD  linagliptin (TRADJENTA) 5 MG TABS tablet Take 1 tablet (5 mg total) by mouth daily. 11/08/22   Ghimire, Werner Lean, MD  melatonin 5 MG TABS Take 1 tablet (5 mg total) by mouth at bedtime as needed (sleep). 11/07/22   Ghimire, Werner Lean, MD  mirtazapine (REMERON) 7.5 MG tablet Take 1 tablet (7.5 mg total) by mouth at bedtime. 11/07/22   Ghimire, Werner Lean, MD  nebivolol (BYSTOLIC) 10 MG tablet Take 1 tablet (10 mg total) by mouth daily. 01/07/23   Bensimhon, Bevelyn Buckles, MD  nitroGLYCERIN (NITROSTAT) 0.4 MG SL tablet Place 1 tablet (0.4 mg total) under the tongue every 5 (five) minutes as needed for chest pain. 05/11/22 11/01/23  Quincy Simmonds, MD  pantoprazole (PROTONIX) 40 MG tablet Take 1 tablet (40 mg total) by mouth daily at 12 noon. 05/11/22   Quincy Simmonds, MD                                                                                                                                    Past Surgical History Past Surgical History:  Procedure Laterality Date   BALLOON DILATION N/A 11/06/2022   Procedure: BALLOON DILATION;  Surgeon: Napoleon Form, MD;  Location: [facility] ENDOSCOPY;  Service: Gastroenterology;  Laterality: N/A;   CARPAL TUNNEL RELEASE     COLONOSCOPY WITH PROPOFOL N/A 11/06/2022   Procedure: COLONOSCOPY WITH PROPOFOL;  Surgeon: Napoleon Form, MD;  Location: [facility] ENDOSCOPY;  Service: Gastroenterology;  Laterality: N/A;   ESOPHAGOGASTRODUODENOSCOPY (EGD) WITH  PROPOFOL N/A 11/06/2022   Procedure: ESOPHAGOGASTRODUODENOSCOPY (EGD) WITH PROPOFOL;  Surgeon: Napoleon Form, MD;  Location: [facility] ENDOSCOPY;  Service: Gastroenterology;  Laterality: N/A;   IR FLUORO GUIDE CV LINE RIGHT  01/26/2022   IR US GUIDE VASC ACCESS RIGHT  01/26/2022   LEFT HEART CATH AND CORONARY ANGIOGRAPHY N/A 12/20/2021   Procedure: LEFT HEART CATH AND CORONARY ANGIOGRAPHY;  Surgeon: Kathleene Hazel, MD;  Location: [facility] INVASIVE CV LAB;  Service: Cardiovascular;  Laterality: N/A;   OPEN REDUCTION INTERNAL FIXATION (ORIF) FINGER WITH RADIAL BONE GRAFT Right 09/12/2015   Procedure: RIGHT MIDDLE FINGER CLOSED REDUCTION AND PINNING;  Surgeon: Bradly Bienenstock, MD;  Location: [facility] OR;  Service: Orthopedics;  Laterality: Right;   TONSILLECTOMY     TUBAL LIGATION     Family History Family History  Problem Relation Age of Onset   Diabetes Mother    Kidney disease Mother    Seizures Father    Hypertension Father    Stroke Father    Asthma Other     Social History Social History   Tobacco Use   Smoking status: Some Days    Current packs/day: 0.25    Average packs/day: 0.3 packs/day for 3.0 years (0.8 ttl pk-yrs)    Types: Cigarettes   Smokeless tobacco: Never  Vaping Use   Vaping status: Never Used  Substance Use Topics   Alcohol use: Yes    Alcohol/week: 0.0 standard drinks of alcohol    Comment: occasional   Drug use: Yes    Types: Cocaine   Allergies Penicillins and Ultram [tramadol]  Review of Systems Review of Systems  All other systems reviewed and are negative.   Physical Exam Vital Signs  I have reviewed the triage vital signs BP (!) 143/104   Pulse 72   Temp 97.9 F (36.6 C) (Oral)   Resp (!) 23   Ht 5\' 4"  (1.626 m)   Wt 98 kg   SpO2 100%   BMI 37.09 kg/m  Physical Exam Vitals and nursing note reviewed.  Constitutional:      General: She is in acute distress.     Appearance: She is well-developed.  HENT:     Head: Normocephalic and atraumatic.      Mouth/Throat:     Mouth: Mucous membranes are moist.  Eyes:     Pupils: Pupils are equal, round, and reactive to light.  Cardiovascular:     Rate and Rhythm: Normal rate and regular rhythm.     Heart sounds: No murmur heard. Pulmonary:     Effort: Tachypnea and accessory muscle usage present. No respiratory distress.     Breath sounds: Examination of the right-upper field reveals wheezing. Examination of the left-upper field reveals wheezing. Examination of the right-middle field reveals wheezing. Examination of the left-middle field reveals wheezing. Examination of the right-lower field reveals rales. Examination of the left-lower field reveals rales. Wheezing and rales present.  Abdominal:     General: Abdomen is flat.     Palpations: Abdomen is soft.     Tenderness: There is no abdominal tenderness.  Musculoskeletal:        General: No tenderness.     Right lower leg: Edema present.     Left lower leg: Edema present.  Skin:    General: Skin is warm and dry.  Neurological:     General: No focal deficit present.     Mental Status: She is alert. Mental status is at baseline.  Psychiatric:        Mood and Affect: Mood normal.        Behavior: Behavior normal.     ED Results and Treatments Labs (all labs ordered are listed, but only abnormal results are displayed) Labs Reviewed  COMPREHENSIVE METABOLIC PANEL - Abnormal; Notable for the following components:      Result Value   CO2 17 (*)    BUN 37 (*)    Creatinine, Ser 4.00 (*)    Calcium 8.4 (*)  Total Protein 6.4 (*)    Albumin 2.4 (*)    Alkaline Phosphatase 145 (*)    GFR, Estimated 12 (*)    All other components within normal limits  CBC WITH DIFFERENTIAL/PLATELET - Abnormal; Notable for the following components:   RBC 3.24 (*)    Hemoglobin 9.2 (*)    HCT 28.9 (*)    All other components within normal limits  BRAIN NATRIURETIC PEPTIDE - Abnormal; Notable for the following components:   B Natriuretic  Peptide 2,437.3 (*)    All other components within normal limits  I-STAT VENOUS BLOOD GAS, ED - Abnormal; Notable for the following components:   pCO2, Ven 36.6 (*)    pO2, Ven 49 (*)    Bicarbonate 18.3 (*)    TCO2 19 (*)    Acid-base deficit 7.0 (*)    Calcium, Ion 1.14 (*)    HCT 29.0 (*)    Hemoglobin 9.9 (*)    All other components within normal limits  PROTIME-INR  TROPONIN I (HIGH SENSITIVITY)  TROPONIN I (HIGH SENSITIVITY)                                                                                                                          Radiology DG Chest Port 1 View  Result Date: 03/22/2023 CLINICAL DATA:  Shortness of breath and chest pain. EXAM: PORTABLE CHEST 1 VIEW COMPARISON:  03/07/2023 FINDINGS: Stable cardiac enlargement. Development worsening diffuse pulmonary interstitial edema/CHF. There may be small bilateral pleural effusions. No pneumothorax. IMPRESSION: Worsening diffuse pulmonary interstitial edema/CHF. Electronically Signed   By: Irish Lack M.D.   On: 03/22/2023 12:56    Pertinent labs & imaging results that were available during my care of the patient were reviewed by me and considered in my medical decision making (see MDM for details).  Medications Ordered in ED Medications  heparin injection 5,000 Units (has no administration in time range)  furosemide (LASIX) injection 60 mg (60 mg Intravenous Given 03/22/23 1245)  ipratropium-albuterol (DUONEB) 0.5-2.5 (3) MG/3ML nebulizer solution 3 mL (3 mLs Nebulization Given 03/22/23 1244)                                                                                                                                     Procedures Procedures  (including critical care time)  Medical Decision Making / ED Course   MDM:  62 year old female presenting  to the emergency department shortness of breath.  Patient initially hypoxic to the 25s with paramedics who gave steroids and albuterol.  Patient continues to  have increased work of breathing with upper lung field wheezing and lower lung field crackles.  Seems most likely to be a combined COPD and CHF exacerbation given wheezing as well as her signs of volume overload.  Did seem to feel better after getting breathing treatment and steroids with paramedics.  She also has lower extremity swelling and crackles on exam with what looks like pulmonary edema on her chest x-ray.  Will check BNP.  VBG without signs of CO2 retention.  Patient denies any chest pain, on room air was 96% lower concern for other process such as pneumonia, pulmonary embolism, pneumothorax.  Will reassess.  Believe patient will probably need readmission given her work of breathing.  Clinical Course as of 03/22/23 1310  Fri Mar 22, 2023  1307 Discussed with Dr. Margo Aye who will admit patient for volume overload. BNP is elevated and CXR is worse than previous.  [WS]    Clinical Course User Index [WS] Lonell Grandchild, MD     Additional history obtained: -Additional history obtained from ems -External records from outside source obtained and reviewed including: Chart review including previous notes, labs, imaging, consultation notes including previous d/c summary   Lab Tests: -I ordered, reviewed, and interpreted labs.   The pertinent results include:   Labs Reviewed  COMPREHENSIVE METABOLIC PANEL - Abnormal; Notable for the following components:      Result Value   CO2 17 (*)    BUN 37 (*)    Creatinine, Ser 4.00 (*)    Calcium 8.4 (*)    Total Protein 6.4 (*)    Albumin 2.4 (*)    Alkaline Phosphatase 145 (*)    GFR, Estimated 12 (*)    All other components within normal limits  CBC WITH DIFFERENTIAL/PLATELET - Abnormal; Notable for the following components:   RBC 3.24 (*)    Hemoglobin 9.2 (*)    HCT 28.9 (*)    All other components within normal limits  BRAIN NATRIURETIC PEPTIDE - Abnormal; Notable for the following components:   B Natriuretic Peptide 2,437.3 (*)     All other components within normal limits  I-STAT VENOUS BLOOD GAS, ED - Abnormal; Notable for the following components:   pCO2, Ven 36.6 (*)    pO2, Ven 49 (*)    Bicarbonate 18.3 (*)    TCO2 19 (*)    Acid-base deficit 7.0 (*)    Calcium, Ion 1.14 (*)    HCT 29.0 (*)    Hemoglobin 9.9 (*)    All other components within normal limits  PROTIME-INR  TROPONIN I (HIGH SENSITIVITY)  TROPONIN I (HIGH SENSITIVITY)    Notable for elevated BNP  EKG   EKG Interpretation Date/Time:  Friday March 22 2023 11:30:25 EDT Ventricular Rate:  72 PR Interval:  149 QRS Duration:  100 QT Interval:  464 QTC Calculation: 508 R Axis:   49  Text Interpretation: Sinus rhythm Borderline prolonged QT interval Confirmed by Alvino Blood ([id number]) on 03/22/2023 11:41:17 AM         Imaging Studies ordered: I ordered imaging studies including CXR On my interpretation imaging demonstrates pulmonary edema I independently visualized and interpreted imaging. I agree with the radiologist interpretation   Medicines ordered and prescription drug management: Meds ordered this encounter  Medications   furosemide (LASIX) injection 60 mg   ipratropium-albuterol (DUONEB)  0.5-2.5 (3) MG/3ML nebulizer solution 3 mL   heparin injection 5,000 Units    -I have reviewed the patients home medicines and have made adjustments as needed   Consultations Obtained: I requested consultation with the hospitalist,  and discussed lab and imaging findings as well as pertinent plan - they recommend: admission   Cardiac Monitoring: The patient was maintained on a cardiac monitor.  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    Final Clinical Impression(s) / ED Diagnoses Final diagnoses:  Acute on chronic diastolic congestive heart failure (HCC)     This chart was dictated using voice recognition software.  Despite best efforts to proofread,  errors can occur which can change the documentation meaning.    Lonell Grandchild, MD 03/22/23 1310

## 2023-03-22 NOTE — Plan of Care (Signed)

## 2023-03-22 NOTE — H&P (Signed)
History and Physical  Carol [name] AOZ:[id number] DOB: [birth year] DOA: 03/22/2023  Referring physician: Dr. Suezanne Jacquet, EDP  PCP: [facility], [facility]  Outpatient Specialists: Cardiology Patient coming from: Home  Chief Complaint: Shortness of breath.  HPI: Carol [name] is a 62 y.o. female with medical history significant for current cocaine abuse, chronic HFpEF, COPD, who presents with sudden onset shortness of breath this morning.  Associated with audible wheezing this morning, and worsening bilateral lower extremity edema for the past few days.  Admits to cocaine use last night.  States she had a heated argument with her son, "that's why she used cocaine," states she does not use it all the time.  Chest feels tight and her back hurts.  Denies subjective fevers or chills.  No productive cough.  EMS was activated.  Upon EMS arrival, the patient was hypoxic with O2 saturation in the 80s.  She received breathing treatment and IV steroid en route via EMS.  In the ED, volume overload on exam, wheezing on lung auscultation, tachypneic, BNP elevated greater than 2400, and chest x-ray with worsening diffuse pulmonary interstitial edema.  The patient received a dose of IV Lasix, 60 mg x 1 and a breathing treatment DuoNebs x 1 in the ED, with improvement of her symptomatology.  Due to concern for acute on chronic HFpEF and COPD exacerbation, EDP requested admission for further evaluation.    The patient was admitted by [facility], hospitalist service, to telemetry cardiac unit as inpatient status.  ED Course: Temperature 97.9.  BP 172/84, pulse 72, respiratory rate 25, saturation 100% on a Ventimask.  Lab studies notable for serum bicarb 17, BUN 37, creatinine 4.0.  GFR 12, alkaline phosphatase 145, albumin 2.4.  WBC 7.1, hemoglobin 9.2.  Platelet count 247.  Troponin negative x 1  Review of Systems: Review of systems as noted in the HPI. All other systems reviewed and are negative.   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   Chronic back pain    Depression    Diabetes mellitus    GERD (gastroesophageal reflux disease)    Heart murmur    Hypertension    NSTEMI (non-ST elevated myocardial infarction) (HCC) 12/20/2021   Palpitations    Parkinson's disease    Past Surgical History:  Procedure Laterality Date   BALLOON DILATION N/A 11/06/2022   Procedure: BALLOON DILATION;  Surgeon: Napoleon Form, MD;  [facility];  Service: Gastroenterology;  Laterality: N/A;   CARPAL TUNNEL RELEASE     COLONOSCOPY WITH PROPOFOL N/A 11/06/2022   Procedure: COLONOSCOPY WITH PROPOFOL;  Surgeon: Napoleon Form, MD;  [facility];  Service: Gastroenterology;  Laterality: N/A;   ESOPHAGOGASTRODUODENOSCOPY (EGD) WITH PROPOFOL N/A 11/06/2022   Procedure: ESOPHAGOGASTRODUODENOSCOPY (EGD) WITH PROPOFOL;  Surgeon: Napoleon Form, MD;  [facility];  Service: Gastroenterology;  Laterality: N/A;   IR FLUORO GUIDE CV LINE RIGHT  01/26/2022   IR US GUIDE VASC ACCESS RIGHT  01/26/2022   LEFT HEART CATH AND CORONARY ANGIOGRAPHY N/A 12/20/2021   Procedure: LEFT HEART CATH AND CORONARY ANGIOGRAPHY;  Surgeon: Kathleene Hazel, MD;  [facility];  Service: Cardiovascular;  Laterality: N/A;   OPEN REDUCTION INTERNAL FIXATION (ORIF) FINGER WITH RADIAL BONE GRAFT Right 09/12/2015   Procedure: RIGHT MIDDLE FINGER CLOSED  REDUCTION AND PINNING;  Surgeon: Bradly Bienenstock, MD;  Location: [facility] OR;  Service: Orthopedics;  Laterality: Right;   TONSILLECTOMY     TUBAL LIGATION      Social History:  reports that she has been smoking cigarettes. She has a 0.8 pack-year smoking history. She has never used smokeless tobacco. She reports current alcohol use. She reports current drug use.  Drug: Cocaine.   Allergies  Allergen Reactions   Penicillins Shortness Of Breath and Other (See Comments)    Caused yeast infection Has patient had a PCN reaction causing immediate rash, facial/tongue/throat swelling, SOB or lightheadedness with hypotension: Yes Has patient had a PCN reaction causing severe rash involving mucus membranes or skin necrosis: No Has patient had a PCN reaction that required hospitalization pt was in the hospital at the time of the reaction Has patient had a PCN reaction occurring within the last 10 years: No If all of the above answers are "NO", then may proceed with Cephalosp   Ultram [Tramadol] Other (See Comments)    Made her tongue raw    Family History  Problem Relation Age of Onset   Diabetes Mother    Kidney disease Mother    Seizures Father    Hypertension Father    Stroke Father    Asthma Other       Prior to Admission medications   Medication Sig Start Date End Date Taking? Authorizing Provider  albuterol (PROVENTIL HFA;VENTOLIN HFA) 108 (90 Base) MCG/ACT inhaler Inhale 2 puffs into the lungs every 4 (four) hours as needed for wheezing or shortness of breath.    [provider]  amLODipine (NORVASC) 10 MG tablet Take 1 tablet (10 mg total) by mouth daily. 11/01/22   Jacklynn Ganong, FNP  aspirin EC 81 MG tablet Take 1 tablet (81 mg total) by mouth daily. Swallow whole. 05/11/22   Quincy Simmonds, MD  atorvastatin (LIPITOR) 80 MG tablet Take 1 tablet (80 mg total) by mouth daily. 03/11/23   Azucena Fallen, MD  budesonide-formoterol [facility]) 160-4.5 MCG/ACT inhaler Inhale 2 puffs into the lungs every morning. 11/09/21   [provider]  cetirizine (ZYRTEC) 10 MG tablet Take 10 mg by mouth daily as needed for allergies.    [provider]  ergocalciferol (VITAMIN D2) 1.25 MG (50000 UT) capsule Take 1 capsule (50,000 Units total) by mouth once a week. Wednesday 05/11/22   Quincy Simmonds, MD  eszopiclone 3 MG TABS  Take 3 mg by mouth at bedtime. Take immediately before bedtime    [provider]  furosemide (LASIX) 40 MG tablet Take 40 mg by mouth daily.    [provider]  gabapentin (NEURONTIN) 100 MG capsule Take 1 capsule (100 mg total) by mouth at bedtime. 03/11/23   Azucena Fallen, MD  hydrALAZINE (APRESOLINE) 50 MG tablet Take 1 tablet (50 mg total) by mouth 3 (three) times daily. 01/07/23 01/07/24  Laurey Morale, MD  isosorbide mononitrate (IMDUR) 30 MG 24 hr tablet Take 0.5 tablets (15 mg total) by mouth daily. 01/07/23   Laurey Morale, MD  linagliptin (TRADJENTA) 5 MG TABS tablet Take 1 tablet (5 mg total) by mouth daily. 11/08/22   Ghimire, Werner Lean, MD  melatonin 5 MG TABS Take 1 tablet (5 mg total) by mouth at bedtime as needed (sleep). 11/07/22   Ghimire, Werner Lean, MD  mirtazapine (REMERON) 7.5 MG tablet Take 1 tablet (7.5 mg total) by mouth at bedtime. 11/07/22   Ghimire, Werner Lean, MD  nebivolol (BYSTOLIC) 10 MG tablet Take 1 tablet (10 mg total) by mouth daily. 01/07/23   Bensimhon, Bevelyn Buckles, MD  nitroGLYCERIN (NITROSTAT) 0.4 MG SL tablet Place 1 tablet (0.4 mg total) under the tongue every 5 (five) minutes as needed for chest pain. 05/11/22 11/01/23  Quincy Simmonds, MD  pantoprazole (PROTONIX) 40 MG tablet Take 1 tablet (40 mg total) by mouth daily at 12 noon. 05/11/22   Quincy Simmonds, MD    Physical Exam: BP (!) 143/104   Pulse 72   Temp 97.9 F (36.6 C) (Oral)   Resp (!) 23   Ht 5\' 4"  (1.626 m)   Wt 98 kg   SpO2 100%   BMI 37.09 kg/m   General: 62 y.o. year-old female well developed well nourished in no acute distress.  Alert and oriented x3. Cardiovascular: Regular rate and rhythm with no rubs or gallops.  No thyromegaly or JVD noted.  2+ pitting edema in lower extremities bilaterally.  Respiratory: Mild diffuse wheezing bilaterally.  Poor inspiratory effort. Abdomen: Soft nontender nondistended with normal bowel sounds x4 quadrants. Muskuloskeletal: No  cyanosis or clubbing noted bilaterally Neuro: CN II-XII intact, strength, sensation, reflexes Skin: No ulcerative lesions noted or rashes Psychiatry: Judgement and insight appear normal. Mood is appropriate for condition and setting          Labs on Admission:  Basic Metabolic Panel: Recent Labs  Lab 03/22/23 1154 03/22/23 1209  NA 135 136  K 3.8 3.9  CL 107  --   CO2 17*  --   GLUCOSE 92  --   BUN 37*  --   CREATININE 4.00*  --   CALCIUM 8.4*  --    Liver Function Tests: Recent Labs  Lab 03/22/23 1154  AST 20  ALT 13  ALKPHOS 145*  BILITOT 0.8  PROT 6.4*  ALBUMIN 2.4*   No results for input(s): "LIPASE", "AMYLASE" in the last 168 hours. No results for input(s): "AMMONIA" in the last 168 hours. CBC: Recent Labs  Lab 03/22/23 1154 03/22/23 1209  WBC 7.1  --   NEUTROABS 5.0  --   HGB 9.2* 9.9*  HCT 28.9* 29.0*  MCV 89.2  --   PLT 247  --    Cardiac Enzymes: No results for input(s): "CKTOTAL", "CKMB", "CKMBINDEX", "TROPONINI" in the last 168 hours.  BNP (last 3 results) Recent Labs    01/07/23 1429 03/07/23 1526 03/22/23 1140  BNP 1,461.8* 2,525.3* 2,437.3*    ProBNP (last 3 results) No results for input(s): "PROBNP" in the last 8760 hours.  CBG: No results for input(s): "GLUCAP" in the last 168 hours.  Radiological Exams on Admission: DG Chest Port 1 View  Result Date: 03/22/2023 CLINICAL DATA:  Shortness of breath and chest pain. EXAM: PORTABLE CHEST 1 VIEW COMPARISON:  03/07/2023 FINDINGS: Stable cardiac enlargement. Development worsening diffuse pulmonary interstitial edema/CHF. There may be small bilateral pleural effusions. No pneumothorax. IMPRESSION: Worsening diffuse pulmonary interstitial edema/CHF. Electronically Signed   By: Irish Lack M.D.   On: 03/22/2023 12:56    EKG: I independently viewed the EKG done and my findings are as followed: Sinus rhythm rate of 72.  Nonspecific ST-T changes.  QTc 508.  Assessment/Plan Present on  Admission: **None**  Principal Problem:   Acute exacerbation of CHF (congestive heart failure) (HCC)  Acute exacerbation of chronic HFpEF Presented with BNP greater than 2400, and 2+ pitting edema in lower extremities bilaterally. Worsening pulmonary edema seen on chest x-ray Started on IV Lasix in the  ED 60 mg x 1, continue diuresing Last 2D echo done on 03/08/2023 revealed LVEF 60 to 65% with grade 2 diastolic dysfunction. Start strict I's and O's and daily weight Maintain MAP greater than 65 while diuresing, replace electrolytes as indicated.  COPD with exacerbation Wheezing on exam Start IV Solu-Medrol 40 mg daily Resume home bronchodilators, DuoNebs, IV doxycycline for COPD exacerbation Maintain O2 saturation above 90%  QTc prolongation Admission twelve-lead EKG with QTc of 508 Avoid QTc prolonging agents Optimize magnesium level greater than 2.0 Optimize potassium level greater than 4.0 Monitor on telemetry  CKD 4 Appears to be close to her baseline creatinine 4.0 with GFR 12 Closely monitor urine output with strict I's and O's Avoid nephrotoxic agents and hypotension. Repeat BMP in the morning  Non anion gap metabolic acidosis secondary to renal insufficiency Serum bicarb 17, anion gap 11 Monitor for now and repeat renal panel in the morning.  Isolated elevated alkaline phosphatase Hypoalbuminemia Alkaline phosphatase 145 Albumin 2.4. Monitor for now.  Cocaine abuse, ongoing Cocaine abuse counseling done at bedside [facility] consulted to provide resources for cessation.  Hyperlipidemia Resume home Lipitor  Hypertension BPs are currently soft Hold off oral antihypertensive to avoid hypotension Maintain MAP greater than 65 Continue to monitor vital signs  Social issues Family issues Heated argument with her son last night, states it caused her to use cocaine The patient lives alone. TOC consulted to assist with social issues   Time: 75 minutes.   DVT  prophylaxis: Subcu heparin 3 times daily  Code Status: Full code  Family Communication: None at bedside.  Disposition Plan: Admitted to telemetry cardiac unit  Consults called: TOC.  Admission status: Inpatient status.   Status is: Inpatient The patient requires at least 2 midnights for further evaluation and treatment of present condition.   Darlin Drop MD [facility] Pager [phone]  If 7PM-7AM, please contact night-coverage [email or web address] Password [email or web address]  03/22/2023, 1:18 PM

## 2023-03-22 NOTE — ED Notes (Signed)
ED TO INPATIENT HANDOFF REPORT  ED Nurse Name and Phone #: Grover Canavan [phone]  S Name/Age/Gender Carol [name] 62 y.o. female Room/Bed: 022C/022C  Code Status   Code Status: Full Code  Home/SNF/Other Home Patient oriented to: self, place, time, and situation Is this baseline? Yes   Triage Complete: Triage complete  Chief Complaint Acute exacerbation of CHF (congestive heart failure) (HCC) [I50.9]  Triage Note Patient bib [facility] from home after waking up having difficulty breathing. On arrival to scene EMS stated her oxygen was 82% and wheezing bilaterally in upper lobes. Patient told EMS that this happens frequently in the summer she was seen about a week ago for a similar episode. Swelling in the legs has been present in legs for a few weeks, Patient denies CHF and COPD. EMS gave 10mg  of albuterol  of atrovent and 125 of solumedrol enroute and patient states that she feels a little better. On arrival to the ED RN can hear wheezing from across the room.    Allergies Allergies  Allergen Reactions   Penicillins Shortness Of Breath and Other (See Comments)    Caused yeast infection Has patient had a PCN reaction causing immediate rash, facial/tongue/throat swelling, SOB or lightheadedness with hypotension: Yes Has patient had a PCN reaction causing severe rash involving mucus membranes or skin necrosis: No Has patient had a PCN reaction that required hospitalization pt was in the hospital at the time of the reaction Has patient had a PCN reaction occurring within the last 10 years: No If all of the above answers are "NO", then may proceed with Cephalosp   Ultram [Tramadol] Other (See Comments)    Made her tongue raw    Level of Care/Admitting Diagnosis ED Disposition     ED Disposition  Admit   Condition  --   Comment  Hospital Area: [facility] [[id number]]  Level of Care: Telemetry Cardiac [[id number]]  May admit patient to [facility] or [facility] if  equivalent level of care is available:: No  Covid Evaluation: Asymptomatic - no recent exposure (last 10 days) testing not required  Diagnosis: Acute exacerbation of CHF (congestive heart failure) [facility]) [[id number]]  Admitting Physician: Darlin Drop [[id number]]  Attending Physician: Darlin Drop [[id number]]  Certification:: I certify this patient will need inpatient services for at least 2 midnights  Estimated Length of Stay: 2          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   Chronic back pain    Depression    Diabetes mellitus    GERD (gastroesophageal reflux disease)    Heart murmur    Hypertension    NSTEMI (non-ST elevated myocardial infarction) (HCC) 12/20/2021   Palpitations    Parkinson's disease    Past Surgical History:  Procedure Laterality Date   BALLOON DILATION N/A 11/06/2022   Procedure: BALLOON DILATION;  Surgeon: Napoleon Form, MD;  [facility];  Service: Gastroenterology;  Laterality: N/A;   CARPAL TUNNEL RELEASE     COLONOSCOPY WITH PROPOFOL N/A 11/06/2022   Procedure: COLONOSCOPY WITH PROPOFOL;  Surgeon: Napoleon Form, MD;  [facility];  Service: Gastroenterology;  Laterality: N/A;   ESOPHAGOGASTRODUODENOSCOPY (EGD) WITH PROPOFOL N/A 11/06/2022   Procedure: ESOPHAGOGASTRODUODENOSCOPY (EGD) WITH PROPOFOL;  Surgeon: Napoleon Form,  MD;  [facility];  Service: Gastroenterology;  Laterality: N/A;   IR FLUORO GUIDE CV LINE RIGHT  01/26/2022   IR US GUIDE VASC ACCESS RIGHT  01/26/2022   LEFT HEART CATH AND CORONARY ANGIOGRAPHY N/A 12/20/2021   Procedure: LEFT HEART CATH AND CORONARY ANGIOGRAPHY;  Surgeon: Kathleene Hazel, MD;  [facility];  Service: Cardiovascular;  Laterality: N/A;   OPEN  REDUCTION INTERNAL FIXATION (ORIF) FINGER WITH RADIAL BONE GRAFT Right 09/12/2015   Procedure: RIGHT MIDDLE FINGER CLOSED REDUCTION AND PINNING;  Surgeon: Bradly Bienenstock, MD;  [facility];  Service: Orthopedics;  Laterality: Right;   TONSILLECTOMY     TUBAL LIGATION       A IV Location/Drains/Wounds Patient Lines/Drains/Airways Status     Active Line/Drains/Airways     Name Placement date Placement time Site Days   Peripheral IV 03/22/23 20 G Anterior;Left;Upper Arm 03/22/23  1103  Arm  less than 1            Intake/Output Last 24 hours No intake or output data in the 24 hours ending 03/22/23 1313  Labs/Imaging Results for orders placed or performed during the hospital encounter of 03/22/23 (from the past 48 hour(s))  Brain natriuretic peptide     Status: Abnormal   Collection Time: 03/22/23 11:40 AM  Result Value Ref Range   B Natriuretic Peptide 2,437.3 (H) 0.0 - 100.0 pg/mL    Comment: Performed at [facility], [address], [place], [place] [address]  Comprehensive metabolic panel     Status: Abnormal   Collection Time: 03/22/23 11:54 AM  Result Value Ref Range   Sodium 135 135 - 145 mmol/L   Potassium 3.8 3.5 - 5.1 mmol/L   Chloride 107 98 - 111 mmol/L   CO2 17 (L) 22 - 32 mmol/L   Glucose, Bld 92 70 - 99 mg/dL    Comment: Glucose reference range applies only to samples taken after fasting for at least 8 hours.   BUN 37 (H) 6 - 20 mg/dL   Creatinine, Ser 6.04 (H) 0.44 - 1.00 mg/dL   Calcium 8.4 (L) 8.9 - 10.3 mg/dL   Total Protein 6.4 (L) 6.5 - 8.1 g/dL   Albumin 2.4 (L) 3.5 - 5.0 g/dL   AST 20 15 - 41 U/L   ALT 13 0 - 44 U/L   Alkaline Phosphatase 145 (H) 38 - 126 U/L   Total Bilirubin 0.8 0.3 - 1.2 mg/dL   GFR, Estimated 12 (L) >60 mL/min    Comment: (NOTE) Calculated using the CKD-EPI Creatinine Equation (2021)    Anion gap 11 5 - 15    Comment: Performed at [facility], [address], [place], [place] [address]  CBC with Differential      Status: Abnormal   Collection Time: 03/22/23 11:54 AM  Result Value Ref Range   WBC 7.1 4.0 - 10.5 K/uL   RBC 3.24 (L) 3.87 - 5.11 MIL/uL   Hemoglobin 9.2 (L) 12.0 - 15.0 g/dL   HCT 11.9 (L) 14.7 - 82.9 %   MCV 89.2 80.0 - 100.0 fL   MCH 28.4 26.0 - 34.0 pg   MCHC 31.8 30.0 - 36.0 g/dL   RDW 56.2 13.0 - 86.5 %   Platelets 247 150 - 400 K/uL   nRBC 0.0 0.0 - 0.2 %   Neutrophils Relative % 71 %   Neutro Abs 5.0 1.7 - 7.7 K/uL   Lymphocytes Relative 15 %  Lymphs Abs 1.1 0.7 - 4.0 K/uL   Monocytes Relative 12 %   Monocytes Absolute 0.9 0.1 - 1.0 K/uL   Eosinophils Relative 2 %   Eosinophils Absolute 0.1 0.0 - 0.5 K/uL   Basophils Relative 0 %   Basophils Absolute 0.0 0.0 - 0.1 K/uL   Immature Granulocytes 0 %   Abs Immature Granulocytes 0.03 0.00 - 0.07 K/uL    Comment: Performed at [facility], [address], [place], [place] [address]  Protime-INR     Status: None   Collection Time: 03/22/23 11:54 AM  Result Value Ref Range   Prothrombin Time 14.8 11.4 - 15.2 seconds   INR 1.1 0.8 - 1.2    Comment: (NOTE) INR goal varies based on device and disease states. Performed at [facility], [address] [place], [place] [address]   Troponin I (High Sensitivity)     Status: None   Collection Time: 03/22/23 11:54 AM  Result Value Ref Range   Troponin I (High Sensitivity) 17 <18 ng/L    Comment: (NOTE) Elevated high sensitivity troponin I (hsTnI) values and significant  changes across serial measurements may suggest ACS but many other  chronic and acute conditions are known to elevate hsTnI results.  Refer to the "Links" section for chest pain algorithms and additional  guidance. Performed at [facility], [address], [place], [place] [address]   I-Stat venous blood gas, ED     Status: Abnormal   Collection Time: 03/22/23 12:09 PM  Result Value Ref Range   pH, Ven 7.308 7.25 - 7.43   pCO2, Ven 36.6 (L) 44 - 60 mmHg   pO2, Ven 49 (H) 32 - 45 mmHg    Bicarbonate 18.3 (L) 20.0 - 28.0 mmol/L   TCO2 19 (L) 22 - 32 mmol/L   O2 Saturation 81 %   Acid-base deficit 7.0 (H) 0.0 - 2.0 mmol/L   Sodium 136 135 - 145 mmol/L   Potassium 3.9 3.5 - 5.1 mmol/L   Calcium, Ion 1.14 (L) 1.15 - 1.40 mmol/L   HCT 29.0 (L) 36.0 - 46.0 %   Hemoglobin 9.9 (L) 12.0 - 15.0 g/dL   Sample type VENOUS    DG Chest Port 1 View  Result Date: 03/22/2023 CLINICAL DATA:  Shortness of breath and chest pain. EXAM: PORTABLE CHEST 1 VIEW COMPARISON:  03/07/2023 FINDINGS: Stable cardiac enlargement. Development worsening diffuse pulmonary interstitial edema/CHF. There may be small bilateral pleural effusions. No pneumothorax. IMPRESSION: Worsening diffuse pulmonary interstitial edema/CHF. Electronically Signed   By: Irish Lack M.D.   On: 03/22/2023 12:56    Pending Labs Unresulted Labs (From admission, onward)    None       Vitals/Pain Today's Vitals   03/22/23 1130 03/22/23 1131 03/22/23 1134 03/22/23 1247  BP:  (!) 172/84  (!) 143/104  Pulse:  72  72  Resp:  (!) 25  (!) 23  Temp:  97.9 F (36.6 C)    TempSrc:  Oral    SpO2: 100% 100%  100%  Weight:   98 kg   Height:   5\' 4"  (1.626 m)   PainSc:   8      Isolation Precautions No active isolations  Medications Medications  heparin injection 5,000 Units (has no administration in time range)  acetaminophen (TYLENOL) tablet 650 mg (has no administration in time range)  prochlorperazine (COMPAZINE) injection 5 mg (has no administration in time range)  polyethylene glycol (MIRALAX / GLYCOLAX)  packet 17 g (has no administration in time range)  melatonin tablet 5 mg (has no administration in time range)  furosemide (LASIX) injection 60 mg (60 mg Intravenous Given 03/22/23 1245)  ipratropium-albuterol (DUONEB) 0.5-2.5 (3) MG/3ML nebulizer solution 3 mL (3 mLs Nebulization Given 03/22/23 1244)    Mobility walks with person assist     Focused Assessments Pulmonary Assessment Handoff:  Lung sounds:  Bilateral Breath Sounds: Expiratory wheezes L Breath Sounds: Expiratory wheezes R Breath Sounds: Expiratory wheezes O2 Device: Aerosol Mask O2 [name] Rate (L/min): 8 L/min    R Recommendations: See Admitting Provider Note  Report given to:   Additional Notes:   \

## 2023-03-22 NOTE — ED Triage Notes (Signed)
Patient bib [facility] from home after waking up having difficulty breathing. On arrival to scene EMS stated her oxygen was 82% and wheezing bilaterally in upper lobes. Patient told EMS that this happens frequently in the summer she was seen about a week ago for a similar episode. Swelling in the legs has been present in legs for a few weeks, Patient denies CHF and COPD. EMS gave 10mg  of albuterol  of atrovent and 125 of solumedrol enroute and patient states that she feels a little better. On arrival to the ED RN can hear wheezing from across the room.

## 2023-03-22 NOTE — Progress Notes (Signed)
Approximately 1500-- Pt arrived to [facility] from ED. Upon arrival, pt A&O x 4 and VSS. Pt breathing labored, but no S/S of acute distress at this time. O2 sats 98%, pt placed on 2L O2 [place] for comfort. Full assessment completed by this RN. Telemetry connected and notified. Fall precautions in place.

## 2023-03-23 DIAGNOSIS — I5033 Acute on chronic diastolic (congestive) heart failure: Secondary | ICD-10-CM | POA: Diagnosis not present

## 2023-03-23 LAB — RETICULOCYTES
Immature Retic Fract: 21.3 % — ABNORMAL HIGH (ref 2.3–15.9)
RBC.: 3.07 MIL/uL — ABNORMAL LOW (ref 3.87–5.11)
Retic Count, Absolute: 58.6 10*3/uL (ref 19.0–186.0)
Retic Ct Pct: 1.9 % (ref 0.4–3.1)

## 2023-03-23 LAB — VITAMIN B12: Vitamin B-12: 309 pg/mL (ref 180–914)

## 2023-03-23 LAB — BASIC METABOLIC PANEL
Anion gap: 16 — ABNORMAL HIGH (ref 5–15)
BUN: 48 mg/dL — ABNORMAL HIGH (ref 6–20)
CO2: 15 mmol/L — ABNORMAL LOW (ref 22–32)
Calcium: 8.6 mg/dL — ABNORMAL LOW (ref 8.9–10.3)
Chloride: 102 mmol/L (ref 98–111)
Creatinine, Ser: 4.25 mg/dL — ABNORMAL HIGH (ref 0.44–1.00)
GFR, Estimated: 11 mL/min — ABNORMAL LOW (ref >60)
Glucose, Bld: 214 mg/dL — ABNORMAL HIGH (ref 70–99)
Potassium: 4 mmol/L (ref 3.5–5.1)
Sodium: 133 mmol/L — ABNORMAL LOW (ref 135–145)

## 2023-03-23 LAB — GLUCOSE, CAPILLARY
Glucose-Capillary: 138 mg/dL — ABNORMAL HIGH (ref 70–99)
Glucose-Capillary: 203 mg/dL — ABNORMAL HIGH (ref 70–99)
Glucose-Capillary: 226 mg/dL — ABNORMAL HIGH (ref 70–99)
Glucose-Capillary: 158 mg/dL — ABNORMAL HIGH (ref 70–99)

## 2023-03-23 LAB — FERRITIN: Ferritin: 100 ng/mL (ref 11–307)

## 2023-03-23 LAB — IRON AND TIBC
Iron: 30 ug/dL (ref 28–170)
Saturation Ratios: 9 % — ABNORMAL LOW (ref 10.4–31.8)
TIBC: 332 ug/dL (ref 250–450)
UIBC: 302 ug/dL

## 2023-03-23 LAB — FOLATE: Folate: 7.6 ng/mL (ref >5.9)

## 2023-03-23 MED ORDER — ALBUMIN HUMAN 25 % IV SOLN
25.0000 g | Freq: Two times a day (BID) | INTRAVENOUS | Status: AC
  Administered 2023-03-23 – 2023-03-24 (×4): 25 g via INTRAVENOUS
  Filled 2023-03-23 (×4): qty 100

## 2023-03-23 MED ORDER — FUROSEMIDE 10 MG/ML IJ SOLN
60.0000 mg | Freq: Once | INTRAMUSCULAR | Status: AC
  Administered 2023-03-23: 60 mg via INTRAVENOUS
  Filled 2023-03-23: qty 6

## 2023-03-23 MED ORDER — MAGNESIUM SULFATE 2 GM/50ML IV SOLN
2.0000 g | Freq: Once | INTRAVENOUS | Status: AC
  Administered 2023-03-23: 2 g via INTRAVENOUS
  Filled 2023-03-23: qty 50

## 2023-03-23 MED ORDER — FUROSEMIDE 10 MG/ML IJ SOLN
80.0000 mg | Freq: Two times a day (BID) | INTRAMUSCULAR | Status: DC
  Administered 2023-03-23 – 2023-03-24 (×2): 80 mg via INTRAVENOUS
  Filled 2023-03-23 (×3): qty 8

## 2023-03-23 MED ORDER — INSULIN ASPART 100 UNIT/ML IJ SOLN
0.0000 [IU] | Freq: Three times a day (TID) | INTRAMUSCULAR | Status: DC
  Administered 2023-03-24: 2 [IU] via SUBCUTANEOUS
  Administered 2023-03-25: 3 [IU] via SUBCUTANEOUS
  Administered 2023-03-26: 2 [IU] via SUBCUTANEOUS
  Administered 2023-03-27: 3 [IU] via SUBCUTANEOUS
  Administered 2023-03-27: 2 [IU] via SUBCUTANEOUS
  Administered 2023-03-28: 3 [IU] via SUBCUTANEOUS
  Administered 2023-03-28 – 2023-03-29 (×2): 2 [IU] via SUBCUTANEOUS

## 2023-03-23 MED ORDER — IPRATROPIUM-ALBUTEROL 0.5-2.5 (3) MG/3ML IN SOLN
3.0000 mL | Freq: Three times a day (TID) | RESPIRATORY_TRACT | Status: DC
  Administered 2023-03-23 – 2023-03-24 (×2): 3 mL via RESPIRATORY_TRACT
  Filled 2023-03-23 (×2): qty 3

## 2023-03-23 MED ORDER — OXYCODONE HCL 5 MG PO TABS
5.0000 mg | ORAL_TABLET | Freq: Four times a day (QID) | ORAL | Status: DC | PRN
  Administered 2023-03-23 – 2023-03-29 (×16): 5 mg via ORAL
  Filled 2023-03-23 (×17): qty 1

## 2023-03-23 MED ORDER — NEBIVOLOL HCL 5 MG PO TABS
5.0000 mg | ORAL_TABLET | Freq: Every day | ORAL | Status: DC
  Administered 2023-03-23 – 2023-03-29 (×7): 5 mg via ORAL
  Filled 2023-03-23 (×7): qty 1

## 2023-03-23 NOTE — Progress Notes (Addendum)
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  In the ED volume overloaded wheezing, BNP> 2400, chest x-ray with worsening interstitial edema, creatinine 4.0, hemoglobin 9.2, troponin negative  Subjective: -Remains short of breath  Assessment and Plan:  Acute on chronic diastolic CHF CKD 4 -Admitted with significant volume overload -Last echo 7/24 with EF of 60-65%, grade 2 diastolic dysfunction, normal RV, moderately elevated PA pressures  -Increase Lasix to 80 Mg twice daily -Place Unna boots -Has considerable CKD as well, will request nephrology input, anticipate need for dialysis down the road, albumin is 2.2  CKD 4 Hypoalbuminemia -With massive volume overload, see discussion above  Normocytic anemia -Likely from CKD, check iron stores   COPD -Clinically do not suspect COPD exacerbation, I think this is all volume overload, discontinue IV steroids and doxycycline, continue nebs    QTc prolongation -Potassium is acceptable, will replete mag   Cocaine abuse, -Counseled   Hyperlipidemia Resume home Lipitor  History of sarcoidosis affecting liver -Cardiac MRI was negative for sarcoidosis -Did not follow-up at [facility]     DVT prophylaxis: Subcu heparin 3 times daily   Code Status: Full code   Family Communication: Fianc at bedside.   Disposition Plan: Home likely 4 to 5 days   Consultants: Renal   Procedures:   Antimicrobials:    Objective: Vitals:   03/22/23 1950 03/23/23 0410 03/23/23 0817 03/23/23 0823  BP: (!) 156/71 (!) 174/89 (!) 156/144   Pulse: 74  69 72   Resp: 17 17  18   Temp: (!) 96.4 F (35.8 C) 98.6 F (37 C) 98.1 F (36.7 C)   TempSrc: Oral Oral    SpO2: 99% 99% 100%   Weight:  102.3 kg    Height:        Intake/Output Summary (Last 24 hours) at 03/23/2023 1145 Last data filed at 03/22/2023 2200 Gross per 24 hour  Intake 950.01 ml  Output --  Net 950.01 ml   Filed Weights   03/22/23 1134 03/23/23 0410  Weight: 98 kg 102.3 kg    Examination:  General exam: Chronically ill female sitting up in bed, AAOx3 HEENT: Positive JVD CVS: S1-S2, regular rhythm Lungs: Bilateral rales Abdomen: Distended, bowel sounds present Extremities: 3+ edema Psychiatry:  Mood & affect appropriate.     Data Reviewed:   CBC: Recent Labs  Lab 03/22/23 1154 03/22/23 1209 03/23/23 0105  WBC 7.1  --  3.8*  NEUTROABS 5.0  --   --   HGB 9.2* 9.9* 8.4*  HCT 28.9* 29.0* 25.5*  MCV 89.2  --  86.1  PLT 247  --  220   Basic Metabolic Panel: Recent Labs  Lab 03/22/23 1154 03/22/23 1209 03/23/23 0105  NA 135 136 132*  K 3.8 3.9 4.0  CL 107  --  105  CO2 17*  --  16*  GLUCOSE 92  --  199*  BUN 37*  --  45*  CREATININE 4.00*  --  4.22*  CALCIUM 8.4*  --  8.3*  MG  --   --  1.7  PHOS  --   --  4.6   GFR: Estimated Creatinine Clearance:  16.5 mL/min (A) (by C-G formula based on SCr of 4.22 mg/dL (H)). Liver Function Tests: Recent Labs  Lab 03/22/23 1154 03/23/23 0105  AST 20  --   ALT 13  --   ALKPHOS 145*  --   BILITOT 0.8  --   PROT 6.4*  --   ALBUMIN 2.4* 2.2*   No results for input(s): "LIPASE", "AMYLASE" in the last 168 hours. No results for input(s): "AMMONIA" in the last 168 hours. Coagulation Profile: Recent Labs  Lab 03/22/23 1154  INR 1.1   Cardiac Enzymes: No results for input(s): "CKTOTAL", "CKMB", "CKMBINDEX", "TROPONINI" in the last 168 hours. BNP (last 3 results) No results for input(s): "PROBNP" in the last 8760 hours. HbA1C: No results for input(s): "HGBA1C" in the last 72 hours. CBG: Recent  Labs  Lab 03/23/23 0628  GLUCAP 138*   Lipid Profile: No results for input(s): "CHOL", "HDL", "LDLCALC", "TRIG", "CHOLHDL", "LDLDIRECT" in the last 72 hours. Thyroid Function Tests: No results for input(s): "TSH", "T4TOTAL", "FREET4", "T3FREE", "THYROIDAB" in the last 72 hours. Anemia Panel: No results for input(s): "VITAMINB12", "FOLATE", "FERRITIN", "TIBC", "IRON", "RETICCTPCT" in the last 72 hours. Urine analysis:    Component Value Date/Time   COLORURINE YELLOW 03/08/2023 0517   APPEARANCEUR HAZY (A) 03/08/2023 0517   LABSPEC 1.007 03/08/2023 0517   PHURINE 7.0 03/08/2023 0517   GLUCOSEU NEGATIVE 03/08/2023 0517   HGBUR NEGATIVE 03/08/2023 0517   BILIRUBINUR NEGATIVE 03/08/2023 0517   KETONESUR NEGATIVE 03/08/2023 0517   PROTEINUR >=300 (A) 03/08/2023 0517   UROBILINOGEN 0.2 01/29/2014 2116   NITRITE NEGATIVE 03/08/2023 0517   LEUKOCYTESUR TRACE (A) 03/08/2023 0517   Sepsis Labs: @LABRCNTIP (procalcitonin:4,lacticidven:4)  )No results found for this or any previous visit (from the past 240 hour(s)).   Radiology Studies: DG Chest Port 1 View  Result Date: 03/22/2023 CLINICAL DATA:  Shortness of breath and chest pain. EXAM: PORTABLE CHEST 1 VIEW COMPARISON:  03/07/2023 FINDINGS: Stable cardiac enlargement. Development worsening diffuse pulmonary interstitial edema/CHF. There may be small bilateral pleural effusions. No pneumothorax. IMPRESSION: Worsening diffuse pulmonary interstitial edema/CHF. Electronically Signed   By: Irish Lack M.D.   On: 03/22/2023 12:56     Scheduled Meds:  atorvastatin  80 mg Oral Daily   furosemide  80 mg Intravenous BID   heparin  5,000 Units Subcutaneous Q8H   ipratropium-albuterol  3 mL Nebulization Q6H   methylPREDNISolone (SOLU-MEDROL) injection  40 mg Intravenous Daily   mometasone-formoterol  2 puff Inhalation BID   Continuous Infusions:  doxycycline (VIBRAMYCIN) IV 100 mg (03/23/23 0655)     LOS: 1 day    Time spent:     Zannie Cove, MD [facility]   03/23/2023, 11:45 AM

## 2023-03-23 NOTE — Evaluation (Signed)
Occupational Therapy Evaluation Patient Details Name: Carol [name] MRN: [id number] DOB: 09/02/1960 Today's Date: 03/23/2023   History of Present Illness 62 y.o. female who presents with sudden onset shortness of breath this morning. Past medical history significant for current cocaine abuse, chronic HFpEF, COPD, NSTEMI, Heart murmur, DM, CTR.   Clinical Impression   Pt admitted for above, PTA pt reports being ind in bADLs and ambulating with RW. Pt presenting with impaired balance from baseline, her BP has been elevated but with minimal exertion her HR remained stable. Pt verbalized understanding on compliance with CHF precautions and prevention. Pt needing min guard assist for transfers but deferred gait at this time due to fear of HR increasing drastically. Pt would benefit from continued acute skilled OT services to address deficits and help transition to next level of care. No follow-up OT recommended at this time.      Recommendations for follow up therapy are one component of a multi-disciplinary discharge planning process, led by the attending physician.  Recommendations may be updated based on patient status, additional functional criteria and insurance authorization.   Assistance Recommended at Discharge Set up Supervision/Assistance  Patient can return home with the following A little help with bathing/dressing/bathroom;Assistance with cooking/housework    Functional Status Assessment  Patient has had a recent decline in their functional status and demonstrates the ability to make significant improvements in function in a reasonable and predictable amount of time.  Equipment Recommendations  None recommended by OT    Recommendations for Other Services       Precautions / Restrictions Precautions Precautions: None Restrictions Weight Bearing Restrictions: No      Mobility Bed Mobility Overal bed mobility: Modified Independent                   Transfers Overall transfer level: Needs assistance Equipment used: None Transfers: Sit to/from Stand, Bed to chair/wheelchair/BSC Sit to Stand: Min guard     Step pivot transfers: Min guard            Balance Overall balance assessment: Needs assistance Sitting-balance support: Feet unsupported Sitting balance-Leahy Scale: Good Sitting balance - Comments: sitting EOB     Standing balance-Leahy Scale: Fair Standing balance comment: dynamic balance to be tested in further context                           ADL either performed or assessed with clinical judgement   ADL Overall ADL's : Needs assistance/impaired Eating/Feeding: Independent;Sitting   Grooming: Sitting;Set up   Upper Body Bathing: Independent;Sitting   Lower Body Bathing: Minimal assistance;Sitting/lateral leans   Upper Body Dressing : Independent;Sitting   Lower Body Dressing: Maximal assistance;Sitting/lateral leans Lower Body Dressing Details (indicate cue type and reason): don socks   Toilet Transfer Details (indicate cue type and reason): declined, at least min guard stand pivot based on chair transfer           General ADL Comments: Pt declined wanting to complete any ambulation due to it increasing HR, assured pt that we can monitor it to make sure that it is within safe norms. Pt still elected not to engage in ambulation but willing to pivot into recliner     Vision         Perception     Praxis      Pertinent Vitals/Pain Pain Assessment Pain Assessment: No/denies pain     Hand Dominance Right   Extremity/Trunk Assessment Upper Extremity  Assessment Upper Extremity Assessment: Overall WFL for tasks assessed   Lower Extremity Assessment Lower Extremity Assessment: Defer to PT evaluation       Communication Communication Communication: No difficulties   Cognition Arousal/Alertness: Awake/alert Behavior During Therapy: WFL for tasks assessed/performed Overall  Cognitive Status: Within Functional Limits for tasks assessed                                       General Comments  Bp noted to be 179/114(128) sitting EOB. Pt transferred to chair and assessed for any signs/symtoms with family at bedside. RN notified of pt request for new bedding    Exercises     Shoulder Instructions      Home Living Family/patient expects to be discharged to:: Private residence Living Arrangements: Alone Available Help at Discharge: Family;Available PRN/intermittently Type of Home: House Home Access: Level entry     Home Layout: One level     Bathroom Shower/Tub: Chief Strategy Officer: Standard     Home Equipment: Cane - single Librarian, academic (2 wheels);Shower seat          Prior Functioning/Environment Prior Level of Function : Independent/Modified Independent;Driving             Mobility Comments: ambulates with RW ADLs Comments: ind        OT Problem List: Impaired balance (sitting and/or standing);Cardiopulmonary status limiting activity      OT Treatment/Interventions: Patient/family education;Self-care/ADL training;Balance training;Therapeutic activities    OT Goals(Current goals can be found in the care plan section) Acute Rehab OT Goals Patient Stated Goal: To go home OT Goal Formulation: With patient Time For Goal Achievement: 04/06/23 Potential to Achieve Goals: Good ADL Goals Pt Will Perform Grooming: standing;with modified independence Pt Will Perform Lower Body Bathing: sitting/lateral leans;with set-up;with supervision Pt Will Transfer to Toilet: with modified independence;ambulating  OT Frequency: Min 1X/week       AM-PAC OT "6 Clicks" Daily Activity     Outcome Measure Help from another person eating meals?: None Help from another person taking care of personal grooming?: A Little Help from another person toileting, which includes using toliet, bedpan, or urinal?: A Little Help  from another person bathing (including washing, rinsing, drying)?: A Little Help from another person to put on and taking off regular upper body clothing?: None Help from another person to put on and taking off regular lower body clothing?: A Lot 6 Click Score: 19   End of Session Equipment Utilized During Treatment: Gait belt Nurse Communication: Mobility status  Activity Tolerance: Patient tolerated treatment well Patient left: in chair;with call bell/phone within reach;with family/visitor present  OT Visit Diagnosis: Unsteadiness on feet (R26.81)                Time: 1040-1105 OT Time Calculation (min): 25 min Charges:  OT General Charges $OT Visit: 1 Visit OT Evaluation $OT Eval Moderate Complexity: 1 Mod OT Treatments $Therapeutic Activity: 8-22 mins  03/23/2023  AB, OTR/L  Acute Rehabilitation Services  Office: [phone]   Tristan Schroeder 03/23/2023, 11:44 AM

## 2023-03-23 NOTE — Progress Notes (Signed)
Physical Therapy Evaluation Patient Details Name: Carol [name] MRN: [id number] DOB: 1960/12/04 Today's Date: 03/23/2023  History of Present Illness  62 y.o. female who presents with sudden onset shortness of breath 8/2. PMHx: current cocaine abuse, chronic HFpEF, COPD, NSTEMI, Heart murmur, DM, CTR, sarcoidosis affecting liver, EtOH, polysubstance abuse, HTN, DM, volume overload, LE edema  Clinical Impression  Pt was seen for mobility with pre and post BP check, noting no change to move:  pregait 182/91, post  181/93.  Pt is mildly SOB with the effort, initially reluctant to move but agreed and noted that she needs nursing help at home.  Recommend her to have HHPT with nursing intervention if possible, and to continue therapy acutely for mobility and monitoring of tolerances with vitals to move.  Pt has a lot of family visiting but none in close proximity to her living situation.  Follow for goals of acute PT.       If plan is discharge home, recommend the following: A little help with walking and/or transfers;A little help with bathing/dressing/bathroom;Assistance with cooking/housework;Assist for transportation   Can travel by private vehicle        Equipment Recommendations Rolling walker (2 wheels) (if needed for age of current device)  Recommendations for Other Services       Functional Status Assessment Patient has had a recent decline in their functional status and demonstrates the ability to make significant improvements in function in a reasonable and predictable amount of time.     Precautions / Restrictions Precautions Precautions: Fall Precaution Comments: monitor vitals Restrictions Weight Bearing Restrictions: No      Mobility  Bed Mobility               General bed mobility comments: in chair when PT arrived    Transfers Overall transfer level: Needs assistance Equipment used: Rolling walker (2 wheels) Transfers: Sit to/from Stand Sit to  Stand: Min guard, Min assist           General transfer comment: min assist as pt is in lower height chair next to bed    Ambulation/Gait Ambulation/Gait assistance: Min guard Gait Distance (Feet): 25 Feet Assistive device: Rolling walker (2 wheels) Gait Pattern/deviations: Step-through pattern, Wide base of support Gait velocity: reduced Gait velocity interpretation: <1.31 ft/sec, indicative of household ambulator Pre-gait activities: standing balance and posture ck General Gait Details: Walks slowly with wide base on RW, mild SOB and no change in BP with the effort  Stairs            Wheelchair Mobility     Tilt Bed    Modified Rankin (Stroke Patients Only)       Balance Overall balance assessment: Needs assistance Sitting-balance support: Feet supported Sitting balance-Leahy Scale: Good       Standing balance-Leahy Scale: Fair Standing balance comment: less than fair dynamically, relies on walker support                             Pertinent Vitals/Pain Pain Assessment Pain Assessment: Faces Faces Pain Scale: No hurt    Home Living Family/patient expects to be discharged to:: Private residence Living Arrangements: Alone Available Help at Discharge: Family;Available PRN/intermittently Type of Home: House Home Access: Level entry       Home Layout: One level Home Equipment: Cane - single Librarian, academic (2 wheels);Shower seat;Grab bars - tub/shower      Prior Function Prior Level of Function : Independent/Modified  Independent;Driving             Mobility Comments: RW required ADLs Comments: ind     Hand Dominance   Dominant Hand: Right    Extremity/Trunk Assessment   Upper Extremity Assessment Upper Extremity Assessment: Defer to OT evaluation    Lower Extremity Assessment Lower Extremity Assessment: Generalized weakness    Cervical / Trunk Assessment Cervical / Trunk Assessment: Normal  Communication    Communication: No difficulties  Cognition Arousal/Alertness: Awake/alert Behavior During Therapy: Anxious Overall Cognitive Status: Within Functional Limits for tasks assessed                                 General Comments: pt and family express concern about need for nursing intervention at home        General Comments General comments (skin integrity, edema, etc.): BP pre gait was 182/91, post 181/93    Exercises     Assessment/Plan    PT Assessment Patient needs continued PT services  PT Problem List Decreased strength;Decreased activity tolerance;Decreased balance;Decreased coordination;Cardiopulmonary status limiting activity       PT Treatment Interventions DME instruction;Gait training;Functional mobility training;Therapeutic activities;Therapeutic exercise;Balance training;Neuromuscular re-education;Patient/family education    PT Goals (Current goals can be found in the Care Plan section)  Acute Rehab PT Goals Patient Stated Goal: to get stronger and have nursing assist at home with HHPT PT Goal Formulation: With patient/family Time For Goal Achievement: 03/30/23 Potential to Achieve Goals: Good    Frequency Min 1X/week     Co-evaluation               AM-PAC PT "6 Clicks" Mobility  Outcome Measure Help needed turning from your back to your side while in a flat bed without using bedrails?: A Little Help needed moving from lying on your back to sitting on the side of a flat bed without using bedrails?: A Little Help needed moving to and from a bed to a chair (including a wheelchair)?: A Little Help needed standing up from a chair using your arms (e.g., wheelchair or bedside chair)?: A Little Help needed to walk in hospital room?: A Little Help needed climbing 3-5 steps with a railing? : A Lot 6 Click Score: 17    End of Session Equipment Utilized During Treatment: Gait belt Activity Tolerance: Patient limited by fatigue;Treatment limited  secondary to medical complications (Comment) Patient left: in chair;with call bell/phone within reach;with family/visitor present Nurse Communication: Mobility status PT Visit Diagnosis: Muscle weakness (generalized) (M62.81);History of falling (Z91.81)    Time: 5284-1324 PT Time Calculation (min) (ACUTE ONLY): 28 min   Charges:   PT Evaluation $PT Eval Moderate Complexity: 1 Mod PT Treatments $Gait Training: 8-22 mins PT General Charges $$ ACUTE PT VISIT: 1 Visit        Ivar Drape 03/23/2023, 1:07 PM  Samul Dada, PT PhD Acute Rehab Dept. Number: [facility] [phone] and [facility] [phone]

## 2023-03-23 NOTE — Plan of Care (Signed)
  Problem: Education: Goal: Knowledge of General Education information will improve Description: Including pain rating scale, medication(s)/side effects and non-pharmacologic comfort measures Outcome: Progressing   Problem: Clinical Measurements: Goal: Ability to maintain clinical measurements within normal limits will improve Outcome: Progressing Goal: Will remain free from infection Outcome: Progressing Goal: Diagnostic test results will improve Outcome: Progressing Goal: Respiratory complications will improve Outcome: Progressing   Problem: Activity: Goal: Risk for activity intolerance will decrease Outcome: Progressing   Problem: Nutrition: Goal: Adequate nutrition will be maintained Outcome: Progressing   Problem: Coping: Goal: Level of anxiety will decrease Outcome: Progressing   Problem: Elimination: Goal: Will not experience complications related to bowel motility Outcome: Progressing Goal: Will not experience complications related to urinary retention Outcome: Progressing

## 2023-03-23 NOTE — Consult Note (Signed)
[name]  HISTORY AND PHYSICAL  Carol [name] is an 62 y.o. female.    Chief Complaint: SOB and leg swelling  HPI: Pt is a 76F with a PMH sig for HTN, HLD, DM II, h/o CHF exacerbation, anemia, polysubstance abuse who is now seen in consultation at the request of Dr. Jomarie Longs for evaluation and recommendations surrounding AKI on CKD and volume overload.    Pt was recently admitted to [facility] 7/18-7/22 for acute on chronic CHF exacerbation.  Was diuresed with IV Lasix and was discharged on Lasix 40 mg PO daily.  She comes back in with Increased LE edema, SOB, wheezing.  Cr was 4.00 (discharge Cr was 4.02 03/11/23).  Was given 80 IV Lasix and Unna boots have been placed.  In this setting we are asked to see.  Today, pt feeling better.  Notes that breathign improved but still has some increased LE edema.  Her albumin is 2.2.  Looks like she's had 7g proteinuria on UP/C last admission.  She hasn't seen [place] in clinic- slated for Monday.  Not on ACEi/ARB.    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   Chronic back pain    Depression    Diabetes mellitus    GERD (gastroesophageal reflux disease)    Heart murmur    Hypertension    NSTEMI (non-ST elevated myocardial infarction) (HCC) 12/20/2021   Palpitations    Parkinson's disease    PSH: Past Surgical History:  Procedure Laterality Date   BALLOON DILATION N/A 11/06/2022   Procedure: BALLOON DILATION;  Surgeon: Napoleon Form, MD;  Location: [facility];  Service: Gastroenterology;  Laterality: N/A;   CARPAL TUNNEL RELEASE     COLONOSCOPY WITH PROPOFOL N/A 11/06/2022   Procedure: COLONOSCOPY WITH PROPOFOL;  Surgeon: Napoleon Form, MD;  Location: [facility];  Service: Gastroenterology;  Laterality: N/A;    ESOPHAGOGASTRODUODENOSCOPY (EGD) WITH PROPOFOL N/A 11/06/2022   Procedure: ESOPHAGOGASTRODUODENOSCOPY (EGD) WITH PROPOFOL;  Surgeon: Napoleon Form, MD;  Location: [facility];  Service: Gastroenterology;  Laterality: N/A;   IR FLUORO GUIDE CV LINE RIGHT  01/26/2022   IR US GUIDE VASC ACCESS RIGHT  01/26/2022   LEFT HEART CATH AND CORONARY ANGIOGRAPHY N/A 12/20/2021   Procedure: LEFT HEART CATH AND CORONARY ANGIOGRAPHY;  Surgeon: Kathleene Hazel, MD;  Location: [facility] INV[facility] CV LAB;  Service: Cardiovascular;  Laterality: N/A;   OPEN REDUCTION INTERNAL FIXATION (ORIF) FINGER WITH RADIAL BONE GRAFT Right 09/12/2015   Procedure: RIGHT MIDDLE FINGER CLOSED REDUCTION AND PINNING;  Surgeon: Bradly Bienenstock, MD;  Location: [facility] OR;  Service: Orthopedics;  Laterality: Right;   TONSILLECTOMY     TUBAL LIGATION      Past Medical History:  Diagnosis Date   Abnormal liver function tests    Acute Carol injury (HCC) 01/2014   Hospitalized, Volume depletion, nausea and vomiting   Anxiety    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   Chronic back pain    Depression    Diabetes mellitus    GERD (gastroesophageal reflux disease)    Heart murmur    Hypertension    NSTEMI (non-ST elevated myocardial infarction) (HCC) 12/20/2021   Palpitations    Parkinson's disease     Medications:  Scheduled:  atorvastatin  80 mg Oral Daily  furosemide  80 mg Intravenous BID   heparin  5,000 Units Subcutaneous Q8H   ipratropium-albuterol  3 mL Nebulization Q6H   mometasone-formoterol  2 puff Inhalation BID    Medications Prior to Admission  Medication Sig Dispense Refill   albuterol (PROVENTIL HFA;VENTOLIN HFA) 108 (90 Base) MCG/ACT inhaler Inhale 2 puffs into the lungs every 4 (four) hours as needed for wheezing or shortness of breath.     amLODipine (NORVASC) 10 MG tablet Take 1 tablet (10 mg total) by mouth daily. 30 tablet 3   aspirin EC 81 MG tablet Take 1 tablet (81 mg total) by mouth daily.  Swallow whole. 30 tablet 0   atorvastatin (LIPITOR) 80 MG tablet Take 1 tablet (80 mg total) by mouth daily. 30 tablet 0   budesonide-formoterol (SYMBICORT) 160-4.5 MCG/ACT inhaler Inhale 2 puffs into the lungs every morning.     buprenorphine-naloxone (SUBOXONE) 2-0.5 mg SUBL SL tablet Place 1 tablet under the tongue daily.     cetirizine (ZYRTEC) 10 MG tablet Take 10 mg by mouth daily as needed for allergies.     ergocalciferol (VITAMIN D2) 1.25 MG (50000 UT) capsule Take 1 capsule (50,000 Units total) by mouth once a week. Wednesday 30 capsule 0   eszopiclone 3 MG TABS Take 3 mg by mouth at bedtime. Take immediately before bedtime     furosemide (LASIX) 40 MG tablet Take 40 mg by mouth daily.     gabapentin (NEURONTIN) 100 MG capsule Take 1 capsule (100 mg total) by mouth at bedtime. 30 capsule 0   hydrALAZINE (APRESOLINE) 50 MG tablet Take 1 tablet (50 mg total) by mouth 3 (three) times daily. 270 tablet 3   isosorbide mononitrate (IMDUR) 30 MG 24 hr tablet Take 0.5 tablets (15 mg total) by mouth daily. 15 tablet 3   linagliptin (TRADJENTA) 5 MG TABS tablet Take 1 tablet (5 mg total) by mouth daily. 30 tablet 1   melatonin 5 MG TABS Take 1 tablet (5 mg total) by mouth at bedtime as needed (sleep). 30 tablet 1   mirtazapine (REMERON) 7.5 MG tablet Take 1 tablet (7.5 mg total) by mouth at bedtime. 30 tablet 3   nebivolol (BYSTOLIC) 10 MG tablet Take 1 tablet (10 mg total) by mouth daily. 30 tablet 6   nitroGLYCERIN (NITROSTAT) 0.4 MG SL tablet Place 1 tablet (0.4 mg total) under the tongue every 5 (five) minutes as needed for chest pain. 30 tablet 0   pantoprazole (PROTONIX) 40 MG tablet Take 1 tablet (40 mg total) by mouth daily at 12 noon. 30 tablet 0    ALLERGIES:   Allergies  Allergen Reactions   Penicillins Shortness Of Breath and Other (See Comments)    Caused yeast infection Has patient had a PCN reaction causing immediate rash, facial/tongue/throat swelling, SOB or lightheadedness  with hypotension: Yes Has patient had a PCN reaction causing severe rash involving mucus membranes or skin necrosis: No Has patient had a PCN reaction that required hospitalization pt was in the hospital at the time of the reaction Has patient had a PCN reaction occurring within the last 10 years: No If all of the above answers are "NO", then may proceed with Cephalosp   Ultram [Tramadol] Other (See Comments)    Made her tongue raw    FAM HX: Family History  Problem Relation Age of Onset   Diabetes Mother    Carol disease Mother    Seizures Father    Hypertension Father    Stroke Father  Asthma Other     Social History:   reports that she has been smoking cigarettes. She has a 0.8 pack-year smoking history. She has never used smokeless tobacco. She reports current alcohol use. She reports current drug use. Drug: Cocaine.  ROS: ROS: all other systems reviewed and are negative except as per HPI  Blood pressure (!) 182/91, pulse 77, temperature 98.5 F (36.9 C), temperature source Oral, resp. rate 16, height 5\' 4"  (1.626 m), weight 102.3 kg, SpO2 98%. PHYSICAL EXAM: Physical Exam GEN NAD, sitting up in chair eating lunch HEENT EOMI PERRL NECK no overt JVD PULM clear, not on O2 CV RRR ABD soft EXT 2+ LE edema with unna boots in place NEURO AAO x 3  SKIN no rashes or lesions   Results for orders placed or performed during the hospital encounter of 03/22/23 (from the past 48 hour(s))  Brain natriuretic peptide     Status: Abnormal   Collection Time: 03/22/23 11:40 AM  Result Value Ref Range   B Natriuretic Peptide 2,437.3 (H) 0.0 - 100.0 pg/mL    Comment: Performed at [facility], [address], [place], [place] [address]  Comprehensive metabolic panel     Status: Abnormal   Collection Time: 03/22/23 11:54 AM  Result Value Ref Range   Sodium 135 135 - 145 mmol/L   Potassium 3.8 3.5 - 5.1 mmol/L   Chloride 107 98 - 111 mmol/L   CO2 17 (L) 22 - 32 mmol/L    Glucose, Bld 92 70 - 99 mg/dL    Comment: Glucose reference range applies only to samples taken after fasting for at least 8 hours.   BUN 37 (H) 6 - 20 mg/dL   Creatinine, Ser 4.78 (H) 0.44 - 1.00 mg/dL   Calcium 8.4 (L) 8.9 - 10.3 mg/dL   Total Protein 6.4 (L) 6.5 - 8.1 g/dL   Albumin 2.4 (L) 3.5 - 5.0 g/dL   AST 20 15 - 41 U/L   ALT 13 0 - 44 U/L   Alkaline Phosphatase 145 (H) 38 - 126 U/L   Total Bilirubin 0.8 0.3 - 1.2 mg/dL   GFR, Estimated 12 (L) >60 mL/min    Comment: (NOTE) Calculated using the CKD-EPI Creatinine Equation (2021)    Anion gap 11 5 - 15    Comment: Performed at [facility], [address] [place], [place] [address]  CBC with Differential     Status: Abnormal   Collection Time: 03/22/23 11:54 AM  Result Value Ref Range   WBC 7.1 4.0 - 10.5 K/uL   RBC 3.24 (L) 3.87 - 5.11 MIL/uL   Hemoglobin 9.2 (L) 12.0 - 15.0 g/dL   HCT 13.0 (L) 86.5 - 78.4 %   MCV 89.2 80.0 - 100.0 fL   MCH 28.4 26.0 - 34.0 pg   MCHC 31.8 30.0 - 36.0 g/dL   RDW 69.6 29.5 - 28.4 %   Platelets 247 150 - 400 K/uL   nRBC 0.0 0.0 - 0.2 %   Neutrophils Relative % 71 %   Neutro Abs 5.0 1.7 - 7.7 K/uL   Lymphocytes Relative 15 %   Lymphs Abs 1.1 0.7 - 4.0 K/uL   Monocytes Relative 12 %   Monocytes Absolute 0.9 0.1 - 1.0 K/uL   Eosinophils Relative 2 %   Eosinophils Absolute 0.1 0.0 - 0.5 K/uL   Basophils Relative 0 %   Basophils Absolute 0.0 0.0 - 0.1 K/uL   Immature Granulocytes 0 %  Abs Immature Granulocytes 0.03 0.00 - 0.07 K/uL    Comment: Performed at [facility], [address] [place], [place] [address]  Protime-INR     Status: None   Collection Time: 03/22/23 11:54 AM  Result Value Ref Range   Prothrombin Time 14.8 11.4 - 15.2 seconds   INR 1.1 0.8 - 1.2    Comment: (NOTE) INR goal varies based on device and disease states. Performed at [facility], [address], [place], [place] [address]   Troponin I (High Sensitivity)     Status: None    Collection Time: 03/22/23 11:54 AM  Result Value Ref Range   Troponin I (High Sensitivity) 17 <18 ng/L    Comment: (NOTE) Elevated high sensitivity troponin I (hsTnI) values and significant  changes across serial measurements may suggest ACS but many other  chronic and acute conditions are known to elevate hsTnI results.  Refer to the "Links" section for chest pain algorithms and additional  guidance. Performed at [facility], [address], [place], [place] [address]   I-Stat venous blood gas, ED     Status: Abnormal   Collection Time: 03/22/23 12:09 PM  Result Value Ref Range   pH, Ven 7.308 7.25 - 7.43   pCO2, Ven 36.6 (L) 44 - 60 mmHg   pO2, Ven 49 (H) 32 - 45 mmHg   Bicarbonate 18.3 (L) 20.0 - 28.0 mmol/L   TCO2 19 (L) 22 - 32 mmol/L   O2 Saturation 81 %   Acid-base deficit 7.0 (H) 0.0 - 2.0 mmol/L   Sodium 136 135 - 145 mmol/L   Potassium 3.9 3.5 - 5.1 mmol/L   Calcium, Ion 1.14 (L) 1.15 - 1.40 mmol/L   HCT 29.0 (L) 36.0 - 46.0 %   Hemoglobin 9.9 (L) 12.0 - 15.0 g/dL   Sample type VENOUS   Troponin I (High Sensitivity)     Status: None   Collection Time: 03/22/23  1:40 PM  Result Value Ref Range   Troponin I (High Sensitivity) 17 <18 ng/L    Comment: (NOTE) Elevated high sensitivity troponin I (hsTnI) values and significant  changes across serial measurements may suggest ACS but many other  chronic and acute conditions are known to elevate hsTnI results.  Refer to the "Links" section for chest pain algorithms and additional  guidance. Performed at [facility], [address] [place], [place] [address]   Renal function panel     Status: Abnormal   Collection Time: 03/23/23  1:05 AM  Result Value Ref Range   Sodium 132 (L) 135 - 145 mmol/L   Potassium 4.0 3.5 - 5.1 mmol/L   Chloride 105 98 - 111 mmol/L   CO2 16 (L) 22 - 32 mmol/L   Glucose, Bld 199 (H) 70 - 99 mg/dL    Comment: Glucose reference range applies only to samples taken after fasting for  at least 8 hours.   BUN 45 (H) 6 - 20 mg/dL   Creatinine, Ser 0.10 (H) 0.44 - 1.00 mg/dL   Calcium 8.3 (L) 8.9 - 10.3 mg/dL   Phosphorus 4.6 2.5 - 4.6 mg/dL   Albumin 2.2 (L) 3.5 - 5.0 g/dL   GFR, Estimated 11 (L) >60 mL/min    Comment: (NOTE) Calculated using the CKD-EPI Creatinine Equation (2021)    Anion gap 11 5 - 15    Comment: Performed at [facility], [address] [place], [place] [address]  CBC  Status: Abnormal   Collection Time: 03/23/23  1:05 AM  Result Value Ref Range   WBC 3.8 (L) 4.0 - 10.5 K/uL   RBC 2.96 (L) 3.87 - 5.11 MIL/uL   Hemoglobin 8.4 (L) 12.0 - 15.0 g/dL   HCT 29.5 (L) 62.1 - 30.8 %   MCV 86.1 80.0 - 100.0 fL   MCH 28.4 26.0 - 34.0 pg   MCHC 32.9 30.0 - 36.0 g/dL   RDW 65.7 84.6 - 96.2 %   Platelets 220 150 - 400 K/uL   nRBC 0.0 0.0 - 0.2 %    Comment: Performed at [facility], [address] [place], [place] [address]  Magnesium     Status: None   Collection Time: 03/23/23  1:05 AM  Result Value Ref Range   Magnesium 1.7 1.7 - 2.4 mg/dL    Comment: Performed at [facility], [address], [place], [place] [address]  Glucose, capillary     Status: Abnormal   Collection Time: 03/23/23  6:28 AM  Result Value Ref Range   Glucose-Capillary 138 (H) 70 - 99 mg/dL    Comment: Glucose reference range applies only to samples taken after fasting for at least 8 hours.  Glucose, capillary     Status: Abnormal   Collection Time: 03/23/23 12:15 PM  Result Value Ref Range   Glucose-Capillary 203 (H) 70 - 99 mg/dL    Comment: Glucose reference range applies only to samples taken after fasting for at least 8 hours.  Basic metabolic panel     Status: Abnormal   Collection Time: 03/23/23 12:16 PM  Result Value Ref Range   Sodium 133 (L) 135 - 145 mmol/L   Potassium 4.0 3.5 - 5.1 mmol/L   Chloride 102 98 - 111 mmol/L   CO2 15 (L) 22 - 32 mmol/L   Glucose, Bld 214 (H) 70 - 99 mg/dL    Comment: Glucose reference range applies only to  samples taken after fasting for at least 8 hours.   BUN 48 (H) 6 - 20 mg/dL   Creatinine, Ser 0.10 (H) 0.44 - 1.00 mg/dL   Calcium 8.6 (L) 8.9 - 10.3 mg/dL   GFR, Estimated 11 (L) >60 mL/min    Comment: (NOTE) Calculated using the CKD-EPI Creatinine Equation (2021)    Anion gap 16 (H) 5 - 15    Comment: Performed at [facility], [address], [place], [place] [address]  Vitamin B12     Status: None   Collection Time: 03/23/23 12:16 PM  Result Value Ref Range   Vitamin B-12 309 180 - 914 pg/mL    Comment: (NOTE) This assay is not validated for testing neonatal or myeloproliferative syndrome specimens for Vitamin B12 levels. Performed at [facility], [address] [place], [place] [address]   Folate     Status: None   Collection Time: 03/23/23 12:16 PM  Result Value Ref Range   Folate 7.6 >5.9 ng/mL    Comment: Performed at [facility], [address], [place], [place] [address]  Iron and TIBC     Status: Abnormal   Collection Time: 03/23/23 12:16 PM  Result Value Ref Range   Iron 30 28 - 170 ug/dL   TIBC 595 638 - 756 ug/dL   Saturation Ratios 9 (L) 10.4 - 31.8 %   UIBC 302 ug/dL    Comment: Performed at [facility], [address], [place], [place] [address]  Ferritin     Status: None   Collection Time: 03/23/23 12:16 PM  Result Value Ref Range   Ferritin 100 11 - 307 ng/mL    Comment: Performed at [facility], [address] [place], [place] [address]  Reticulocytes     Status: Abnormal   Collection Time: 03/23/23 12:16 PM  Result Value Ref Range   Retic Ct Pct 1.9 0.4 - 3.1 %   RBC. 3.07 (L) 3.87 - 5.11 MIL/uL   Retic Count, Absolute 58.6 19.0 - 186.0 K/uL   Immature Retic Fract 21.3 (H) 2.3 - 15.9 %    Comment: Performed at [facility], [address] [place], [place] [address]    DG Chest Port 1 View  Result Date: 03/22/2023 CLINICAL DATA:  Shortness of breath and chest pain. EXAM: PORTABLE CHEST 1 VIEW COMPARISON:   03/07/2023 FINDINGS: Stable cardiac enlargement. Development worsening diffuse pulmonary interstitial edema/CHF. There may be small bilateral pleural effusions. No pneumothorax. IMPRESSION: Worsening diffuse pulmonary interstitial edema/CHF. Electronically Signed   By: Irish Lack M.D.   On: 03/22/2023 12:56    Assessment/Plan CKD IV: appears progressive, Cr was in the mid-3s in March and is now up to 4.  Has nephrotic range proteinuria  - doubt active GN and UA has inactive sediment  - suspect HTN, DM, polysubstance abuse  - do not think bx is necessary  - will send SPEP and free light chains  - diurese with IV Lasix BID  - may be better to d/c on torsemide d/t hypoalbuminemia  - renal US in march with medicorenal disease, dont' think we need to repeat again  - no need for RRT at present but likely will approach this in her lifetime--> mother was on dialysis  2.  Acute on chronic CHF exacerbation  - TTE 02/2023 with EF 6065% and G2DD  - Lasix as above  3.  HTN:  - pretty hypertensive  - I'll put back on her bystolic at half dose to allow [place] to diurese  4.  Dispo: admitted  Bufford Buttner 03/23/2023, 3:36 PM

## 2023-03-23 NOTE — Progress Notes (Signed)
Orthopedic Tech Progress Note Patient Details:  Carol [name] [birth year] [id number]  UB applied Ortho Devices Type of Ortho Device: Radio broadcast assistant Ortho Device/Splint Location: BLE Ortho Device/Splint Interventions: Ordered, Application, Adjustment   Post Interventions Patient Tolerated: Well Instructions Provided: Care of device, Adjustment of device  [name] 03/23/2023, 1:52 PM

## 2023-03-23 NOTE — Progress Notes (Signed)
PT Cancellation Note  Patient Details Name: Carol [name] MRN: [id number] DOB: 1960/12/07   Cancelled Treatment:    Reason Eval/Treat Not Completed: Medical issues which prohibited therapy.  Checking on vitals, will reassess to see at another time.   Ivar Drape 03/23/2023, 9:33 AM  Samul Dada, PT PhD Acute Rehab Dept. Number: [facility] [id number] and [facility] [phone]

## 2023-03-24 DIAGNOSIS — I5033 Acute on chronic diastolic (congestive) heart failure: Secondary | ICD-10-CM | POA: Diagnosis not present

## 2023-03-24 LAB — GLUCOSE, CAPILLARY
Glucose-Capillary: 133 mg/dL — ABNORMAL HIGH (ref 70–99)
Glucose-Capillary: 109 mg/dL — ABNORMAL HIGH (ref 70–99)
Glucose-Capillary: 107 mg/dL — ABNORMAL HIGH (ref 70–99)
Glucose-Capillary: 119 mg/dL — ABNORMAL HIGH (ref 70–99)

## 2023-03-24 LAB — CBC
HCT: 23.2 % — ABNORMAL LOW (ref 36.0–46.0)
Hemoglobin: 7.6 g/dL — ABNORMAL LOW (ref 12.0–15.0)
MCH: 28.3 pg (ref 26.0–34.0)
MCHC: 32.8 g/dL (ref 30.0–36.0)
MCV: 86.2 fL (ref 80.0–100.0)
Platelets: 210 10*3/uL (ref 150–400)
RBC: 2.69 MIL/uL — ABNORMAL LOW (ref 3.87–5.11)
RDW: 13.6 % (ref 11.5–15.5)
WBC: 7.6 10*3/uL (ref 4.0–10.5)
nRBC: 0 % (ref 0.0–0.2)

## 2023-03-24 LAB — BASIC METABOLIC PANEL
Anion gap: 11 (ref 5–15)
BUN: 53 mg/dL — ABNORMAL HIGH (ref 6–20)
CO2: 19 mmol/L — ABNORMAL LOW (ref 22–32)
Calcium: 8.8 mg/dL — ABNORMAL LOW (ref 8.9–10.3)
Chloride: 105 mmol/L (ref 98–111)
Creatinine, Ser: 4.32 mg/dL — ABNORMAL HIGH (ref 0.44–1.00)
GFR, Estimated: 11 mL/min — ABNORMAL LOW (ref >60)
Glucose, Bld: 109 mg/dL — ABNORMAL HIGH (ref 70–99)
Potassium: 3.9 mmol/L (ref 3.5–5.1)
Sodium: 135 mmol/L (ref 135–145)

## 2023-03-24 LAB — HEMOGLOBIN A1C
Hgb A1c MFr Bld: 5.7 % — ABNORMAL HIGH (ref 4.8–5.6)
Mean Plasma Glucose: 116.89 mg/dL

## 2023-03-24 MED ORDER — IPRATROPIUM-ALBUTEROL 0.5-2.5 (3) MG/3ML IN SOLN
3.0000 mL | Freq: Two times a day (BID) | RESPIRATORY_TRACT | Status: DC
  Administered 2023-03-24 – 2023-03-29 (×10): 3 mL via RESPIRATORY_TRACT
  Filled 2023-03-24 (×10): qty 3

## 2023-03-24 MED ORDER — FUROSEMIDE 10 MG/ML IJ SOLN
80.0000 mg | Freq: Three times a day (TID) | INTRAMUSCULAR | Status: DC
  Administered 2023-03-24 – 2023-03-27 (×11): 80 mg via INTRAVENOUS
  Filled 2023-03-24 (×12): qty 8

## 2023-03-24 MED ORDER — HYDRALAZINE HCL 50 MG PO TABS
50.0000 mg | ORAL_TABLET | Freq: Three times a day (TID) | ORAL | Status: DC
  Administered 2023-03-24 – 2023-03-29 (×14): 50 mg via ORAL
  Filled 2023-03-24 (×14): qty 1

## 2023-03-24 MED ORDER — SODIUM CHLORIDE 0.9 % IV SOLN
510.0000 mg | Freq: Once | INTRAVENOUS | Status: AC
  Administered 2023-03-24: 510 mg via INTRAVENOUS
  Filled 2023-03-24: qty 17

## 2023-03-24 MED ORDER — ISOSORBIDE MONONITRATE ER 30 MG PO TB24
15.0000 mg | ORAL_TABLET | Freq: Every day | ORAL | Status: DC
  Administered 2023-03-24: 15 mg via ORAL
  Filled 2023-03-24: qty 1

## 2023-03-24 MED ORDER — AMLODIPINE BESYLATE 10 MG PO TABS: 10.0000 mg | ORAL_TABLET | Freq: Every day | ORAL | Status: DC

## 2023-03-24 NOTE — Progress Notes (Signed)
[facility] Progress Note   Assessment/ Plan:   CKD IV: appears progressive, Cr was in the mid-3s in March and is now up to 4.  Has nephrotic range proteinuria             - doubt active GN and UA has inactive sediment             - suspect HTN, DM, polysubstance abuse             - do not think bx is necessary             - will send SPEP and free light chains- pending             - increase Lasix to 80 TID, albumin x 4 doses             - may be better to d/c on torsemide d/t hypoalbuminemia             - renal US in march with medicorenal disease, dont' think we need to repeat again             - no need for RRT at present but likely will approach this in her lifetime--> mother was on dialysis   2.  Acute on chronic CHF exacerbation             - TTE 02/2023 with EF 6065% and G2DD             - Lasix as above   3.  HTN:             - pretty hypertensive             - I'll put back on her bystolic at half dose to allow [place] to diurese  4.  Anemia:  - iron deficient, getting feraheme   5.  Dispo: admitted  Subjective:    Seen in room.  Says that she's getting better with the [facility] boots.  Cr inching up a little.  No uremic symptoms.   Objective:   BP (!) 163/80 (BP Location: Right Wrist)   Pulse 71   Temp 97.9 F (36.6 C) (Oral)   Resp 16   Ht 5\' 4"  (1.626 m)   Wt 102.1 kg   SpO2 98%   BMI 38.64 kg/m   Intake/Output Summary (Last 24 hours) at 03/24/2023 1203 Last data filed at 03/24/2023 1041 Gross per 24 hour  Intake 410.48 ml  Output 2750 ml  Net -2339.52 ml   Weight change: 4.1 kg  Physical Exam: GEN NAD, sitting up in bed HEENT EOMI PERRL NECK no overt JVD PULM clear, not on O2 CV RRR ABD soft EXT 2+ LE edema with unna boots in place, a little better NEURO AAO x 3  SKIN no rashes or lesions  Imaging: DG Chest Port 1 View  Result Date: 03/22/2023 CLINICAL DATA:  Shortness of breath and chest pain. EXAM: PORTABLE CHEST 1 VIEW COMPARISON:   03/07/2023 FINDINGS: Stable cardiac enlargement. Development worsening diffuse pulmonary interstitial edema/CHF. There may be small bilateral pleural effusions. No pneumothorax. IMPRESSION: Worsening diffuse pulmonary interstitial edema/CHF. Electronically Signed   By: Irish Lack M.D.   On: 03/22/2023 12:56    Labs: BMET Recent Labs  Lab 03/22/23 1154 03/22/23 1209 03/23/23 0105 03/23/23 1216 03/24/23 0804  NA 135 136 132* 133* 135  K 3.8 3.9 4.0 4.0 3.9  CL 107  --  105 102 105  CO2 17*  --  16* 15* 19*  GLUCOSE 92  --  199* 214* 109*  BUN 37*  --  45* 48* 53*  CREATININE 4.00*  --  4.22* 4.25* 4.32*  CALCIUM 8.4*  --  8.3* 8.6* 8.8*  PHOS  --   --  4.6  --   --    CBC Recent Labs  Lab 03/22/23 1154 03/22/23 1209 03/23/23 0105 03/24/23 0115  WBC 7.1  --  3.8* 7.6  NEUTROABS 5.0  --   --   --   HGB 9.2* 9.9* 8.4* 7.6*  HCT 28.9* 29.0* 25.5* 23.2*  MCV 89.2  --  86.1 86.2  PLT 247  --  220 210    Medications:     atorvastatin  80 mg Oral Daily   furosemide  80 mg Intravenous TID   heparin  5,000 Units Subcutaneous Q8H   insulin aspart  0-15 Units Subcutaneous TID WC   ipratropium-albuterol  3 mL Nebulization BID   mometasone-formoterol  2 puff Inhalation BID   nebivolol  5 mg Oral Daily    Bufford Buttner MD 03/24/2023, 12:03 PM

## 2023-03-24 NOTE — Plan of Care (Signed)

## 2023-03-24 NOTE — Progress Notes (Signed)
   03/24/23 1515  Spiritual Encounters  Type of Visit Initial  Care provided to: Patient  Referral source Nurse (RN/NT/LPN)  Reason for visit Advance directives  OnCall Visit No  Spiritual Framework  Presenting Themes Meaning/purpose/sources of inspiration;Goals in life/care;Values and beliefs;Impactful experiences and emotions  Patient Stress Factors Loss  Family Stress Factors None identified  Interventions  Spiritual Care Interventions Made Established relationship of care and support;Compassionate presence;Reflective listening;Normalization of emotions;Supported grief process  Intervention Outcomes  Outcomes Connection to spiritual care   Pt shared that she was scared that she may lose her sight or leg because of illness. Pt shared how diabetes has affected her loved ones and she is fearful that diabetes will do the same to her.   Chaplain Intern: Paul Dykes. Christell Constant

## 2023-03-24 NOTE — Progress Notes (Addendum)
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  In the ED volume overloaded wheezing, BNP> 2400, chest x-ray with worsening interstitial edema, creatinine 4.0, hemoglobin 9.2, troponin negative  Subjective: -Feels a little better, swelling starting to improve, started diuresing more since last night  Assessment and Plan:  Acute on chronic diastolic CHF CKD 4 -Admitted with significant volume overload -Last echo 7/24 with EF of 60-65%, grade 2 diastolic dysfunction, normal RV, moderately elevated PA pressures  -On Lasix 80 Mg twice daily,?  Increase dose will defer to nephrology -Unna boots placed -Agree with IV albumin for few days  CKD 4 Hypoalbuminemia -With massive volume overload, see discussion above -Increase protein intake  Anemia of chronic disease and iron deficiency -Iron stores low, add IV iron  DM 2 -CBGs are stable, continue sliding scale insulin, A1c is 5.7   COPD -Clinically do not suspect COPD exacerbation, I think this is all volume overload, discontinued IV steroids and doxycycline, continue nebs    QTc prolongation -Potassium is acceptable, repleted mag   Cocaine abuse, -Counseled   Hyperlipidemia Resume home Lipitor  History of sarcoidosis affecting liver -Cardiac MRI was negative for sarcoidosis -Did not follow-up at [facility]     DVT prophylaxis: Subcu heparin 3 times daily   Code Status: Full code   Family Communication: Fianc at bedside.   Disposition Plan: Home likely 4 to 5 days   Consultants: Renal   Procedures:   Antimicrobials:     Objective: Vitals:   03/24/23 0500 03/24/23 0610 03/24/23 0742 03/24/23 0750  BP:  (!) 169/90 (!) 163/80   Pulse:   70 71  Resp:   18 16  Temp:   97.9 F (36.6 C)   TempSrc:   Oral   SpO2:   98% 98%  Weight: 102.1 kg     Height:        Intake/Output Summary (Last 24 hours) at 03/24/2023 1124 Last data filed at 03/24/2023 1041 Gross per 24 hour  Intake 650.48 ml  Output 2750 ml  Net -2099.52 ml   Filed Weights   03/22/23 1134 03/23/23 0410 03/24/23 0500  Weight: 98 kg 102.3 kg 102.1 kg    Examination:  General exam: Chronically ill female sitting up in bed, AAOx3 HEENT: Positive JVD CVS: S1-S2, regular rhythm Lungs: Bilateral rales Abdomen: Distended, bowel sounds present Extremities: 2+ edema, now with Unna boots Psychiatry:  Mood & affect appropriate.     Data Reviewed:   CBC: Recent Labs  Lab 03/22/23 1154 03/22/23 1209 03/23/23 0105 03/24/23 0115  WBC 7.1  --  3.8* 7.6  NEUTROABS 5.0  --   --   --   HGB 9.2* 9.9* 8.4* 7.6*  HCT 28.9* 29.0* 25.5* 23.2*  MCV 89.2  --  86.1 86.2  PLT 247  --  220 210   Basic Metabolic Panel: Recent Labs  Lab 03/22/23 1154 03/22/23 1209 03/23/23 0105 03/23/23 1216 03/24/23 0804  NA 135 136 132* 133* 135  K 3.8 3.9 4.0 4.0 3.9  CL 107  --  105 102 105  CO2 17*  --  16* 15* 19*  GLUCOSE 92  --  199* 214* 109*  BUN 37*  --  45* 48* 53*  CREATININE 4.00*  --  4.22* 4.25* 4.32*  CALCIUM 8.4*  --  8.3* 8.6* 8.8*  MG  --   --  1.7  --   --   PHOS  --   --  4.6  --   --    GFR: Estimated Creatinine Clearance: 16.1 mL/min (A) (by C-G formula based on SCr of 4.32 mg/dL (H)). Liver Function Tests: Recent Labs  Lab 03/22/23 1154 03/23/23 0105  AST 20  --   ALT 13  --   ALKPHOS 145*  --   BILITOT 0.8  --   PROT 6.4*  --   ALBUMIN 2.4* 2.2*   No results for input(s): "LIPASE", "AMYLASE" in the last 168 hours. No results for input(s): "AMMONIA" in the last 168 hours. Coagulation Profile: Recent Labs  Lab  03/22/23 1154  INR 1.1   Cardiac Enzymes: No results for input(s): "CKTOTAL", "CKMB", "CKMBINDEX", "TROPONINI" in the last 168 hours. BNP (last 3 results) No results for input(s): "PROBNP" in the last 8760 hours. HbA1C: Recent Labs    03/24/23 0115  HGBA1C 5.7*   CBG: Recent Labs  Lab 03/23/23 0628 03/23/23 1215 03/23/23 1628 03/23/23 2140 03/24/23 0606  GLUCAP 138* 203* 226* 158* 133*   Lipid Profile: No results for input(s): "CHOL", "HDL", "LDLCALC", "TRIG", "CHOLHDL", "LDLDIRECT" in the last 72 hours. Thyroid Function Tests: No results for input(s): "TSH", "T4TOTAL", "FREET4", "T3FREE", "THYROIDAB" in the last 72 hours. Anemia Panel: Recent Labs    03/23/23 1216  VITAMINB12 309  FOLATE 7.6  FERRITIN 100  TIBC 332  IRON 30  RETICCTPCT 1.9   Urine analysis:    Component Value Date/Time   COLORURINE YELLOW 03/08/2023 0517   APPEARANCEUR HAZY (A) 03/08/2023 0517   LABSPEC 1.007 03/08/2023 0517   PHURINE 7.0 03/08/2023 0517   GLUCOSEU NEGATIVE 03/08/2023 0517   HGBUR NEGATIVE 03/08/2023 0517   BILIRUBINUR NEGATIVE 03/08/2023 0517   KETONESUR NEGATIVE 03/08/2023 0517   PROTEINUR >=300 (A) 03/08/2023 0517   UROBILINOGEN 0.2 01/29/2014 2116   NITRITE NEGATIVE 03/08/2023 0517   LEUKOCYTESUR TRACE (A) 03/08/2023 0517   Sepsis Labs: @LABRCNTIP (procalcitonin:4,lacticidven:4)  )No results found for this or any previous visit (from the past 240 hour(s)).   Radiology Studies: DG Chest Port 1 View  Result Date: 03/22/2023 CLINICAL DATA:  Shortness of breath and chest pain. EXAM: PORTABLE CHEST 1 VIEW COMPARISON:  03/07/2023 FINDINGS: Stable cardiac enlargement. Development worsening diffuse pulmonary interstitial edema/CHF. There may be small bilateral pleural effusions. No pneumothorax. IMPRESSION: Worsening diffuse pulmonary interstitial edema/CHF. Electronically Signed   By: Irish Lack M.D.   On: 03/22/2023 12:56     Scheduled Meds:  atorvastatin  80 mg  Oral Daily   furosemide  80 mg Intravenous BID   heparin  5,000 Units Subcutaneous Q8H   insulin aspart  0-15 Units Subcutaneous TID WC   ipratropium-albuterol  3 mL Nebulization BID   mometasone-formoterol  2 puff Inhalation BID   nebivolol  5 mg Oral Daily   Continuous Infusions:  albumin human 25 g (03/24/23 0840)     LOS: 2 days    Time spent:    Zannie Cove, MD [facility]   03/24/2023, 11:24 AM

## 2023-03-25 DIAGNOSIS — I5033 Acute on chronic diastolic (congestive) heart failure: Secondary | ICD-10-CM | POA: Diagnosis not present

## 2023-03-25 LAB — PROTEIN ELECTROPHORESIS, SERUM
A/G Ratio: 1 (ref 0.7–1.7)
Albumin ELP: 3.1 g/dL (ref 2.9–4.4)
Alpha-1-Globulin: 0.3 g/dL (ref 0.0–0.4)
Alpha-2-Globulin: 0.9 g/dL (ref 0.4–1.0)
Beta Globulin: 0.9 g/dL (ref 0.7–1.3)
Gamma Globulin: 0.9 g/dL (ref 0.4–1.8)
Globulin, Total: 3 g/dL (ref 2.2–3.9)
Total Protein ELP: 6.1 g/dL (ref 6.0–8.5)

## 2023-03-25 LAB — KAPPA/LAMBDA LIGHT CHAINS
Kappa free light chain: 110.6 mg/L — ABNORMAL HIGH (ref 3.3–19.4)
Kappa, lambda light chain ratio: 1.91 — ABNORMAL HIGH (ref 0.26–1.65)
Lambda free light chains: 57.8 mg/L — ABNORMAL HIGH (ref 5.7–26.3)

## 2023-03-25 LAB — GLUCOSE, CAPILLARY
Glucose-Capillary: 96 mg/dL (ref 70–99)
Glucose-Capillary: 113 mg/dL — ABNORMAL HIGH (ref 70–99)
Glucose-Capillary: 162 mg/dL — ABNORMAL HIGH (ref 70–99)
Glucose-Capillary: 149 mg/dL — ABNORMAL HIGH (ref 70–99)

## 2023-03-25 MED ORDER — DARBEPOETIN ALFA 200 MCG/0.4ML IJ SOSY
200.0000 ug | PREFILLED_SYRINGE | INTRAMUSCULAR | Status: DC
  Administered 2023-03-25: 200 ug via SUBCUTANEOUS
  Filled 2023-03-25: qty 0.4

## 2023-03-25 MED ORDER — POTASSIUM CHLORIDE CRYS ER 20 MEQ PO TBCR
40.0000 meq | EXTENDED_RELEASE_TABLET | Freq: Once | ORAL | Status: AC
  Administered 2023-03-25: 40 meq via ORAL
  Filled 2023-03-25: qty 2

## 2023-03-25 MED ORDER — ALBUMIN HUMAN 25 % IV SOLN
25.0000 g | Freq: Two times a day (BID) | INTRAVENOUS | Status: AC
  Administered 2023-03-25 – 2023-03-26 (×4): 25 g via INTRAVENOUS
  Filled 2023-03-25 (×4): qty 100

## 2023-03-25 MED ORDER — ISOSORBIDE MONONITRATE ER 30 MG PO TB24
30.0000 mg | ORAL_TABLET | Freq: Every day | ORAL | Status: DC
  Administered 2023-03-25 – 2023-03-29 (×5): 30 mg via ORAL
  Filled 2023-03-25 (×5): qty 1

## 2023-03-25 NOTE — Care Management Important Message (Signed)
Important Message  Patient Details  Name: Carol [name] MRN: [id number] Date of Birth: [birth year]   Medicare Important Message Given:  Yes     Renie Ora 03/25/2023, 9:19 AM

## 2023-03-25 NOTE — Progress Notes (Signed)
[facility] Progress Note   Assessment/ Plan:   CKD IV: appears progressive, Cr was in the mid-3s in March and is now up to 4.  Has nephrotic range proteinuria             - doubt active GN and UA has inactive sediment             - suspect HTN, DM, polysubstance abuse             - do not think bx is necessary             - will send SPEP and free light chains- pending             - Lasix to 80 TID, albumin x 4 doses-  completed             - may be better to d/c on torsemide d/t hypoalbuminemia             - renal US in march with medicorenal disease, dont' think we need to repeat again             - no need for RRT at present but likely will approach this in her lifetime--> mother was on dialysis-  is not seeing nephrology -  will need to at discharge   2.  Acute on chronic CHF exacerbation             - TTE 02/2023 with EF 6065% and G2DD             - Lasix as above-  need to continue IV   3.  HTN:             - pretty hypertensive             - I'll put back on her bystolic at half dose to allow [place] to diurese  - hydralazine also appears to have been added last night   4.  Anemia:  - iron deficient, getting feraheme-  will need second dose this week.  Also add esa      Subjective:    Seen in room.   3 liters of urine-  numbers basically stable . Still hypertensive -  not uremic but tearful about her situation   Objective:   BP (!) 187/92 (BP Location: Right Arm)   Pulse 79   Temp 97.8 F (36.6 C) (Oral)   Resp 18   Ht 5\' 4"  (1.626 m)   Wt 101.7 kg   SpO2 97%   BMI 38.50 kg/m   Intake/Output Summary (Last 24 hours) at 03/25/2023 0850 Last data filed at 03/25/2023 0455 Gross per 24 hour  Intake 970 ml  Output 3100 ml  Net -2130 ml   Weight change: -0.358 kg  Physical Exam: GEN NAD, sitting up in bed HEENT EOMI PERRL NECK no overt JVD PULM clear, not on O2 CV RRR ABD soft EXT 2+ LE edema with unna boots in place, also hip and abdominal wall edema   NEURO AAO x 3  SKIN no rashes or lesions  Imaging: No results found.  Labs: BMET Recent Labs  Lab 03/22/23 1154 03/22/23 1209 03/23/23 0105 03/23/23 1216 03/24/23 0804 03/25/23 0142  NA 135 136 132* 133* 135 134*  K 3.8 3.9 4.0 4.0 3.9 3.6  CL 107  --  105 102 105 105  CO2 17*  --  16* 15* 19* 18*  GLUCOSE 92  --  199* 214* 109* 106*  BUN 37*  --  45* 48* 53* 54*  CREATININE 4.00*  --  4.22* 4.25* 4.32* 4.23*  CALCIUM 8.4*  --  8.3* 8.6* 8.8* 9.0  PHOS  --   --  4.6  --   --   --    CBC Recent Labs  Lab 03/22/23 1154 03/22/23 1209 03/23/23 0105 03/24/23 0115 03/25/23 0142  WBC 7.1  --  3.8* 7.6 9.3  NEUTROABS 5.0  --   --   --   --   HGB 9.2* 9.9* 8.4* 7.6* 7.8*  HCT 28.9* 29.0* 25.5* 23.2* 22.8*  MCV 89.2  --  86.1 86.2 86.7  PLT 247  --  220 210 205    Medications:     atorvastatin  80 mg Oral Daily   furosemide  80 mg Intravenous TID   heparin  5,000 Units Subcutaneous Q8H   hydrALAZINE  50 mg Oral TID   insulin aspart  0-15 Units Subcutaneous TID WC   ipratropium-albuterol  3 mL Nebulization BID   isosorbide mononitrate  30 mg Oral Daily   mometasone-formoterol  2 puff Inhalation BID   nebivolol  5 mg Oral Daily     A   03/25/2023, 8:50 AM

## 2023-03-25 NOTE — TOC Initial Note (Signed)
Transition of Care [facility]) - Initial/Assessment Note    Patient Details  Name: Carol [name] MRN: [id number] Date of Birth: Nov 14, 1960  Transition of Care [facility]) CM/SW Contact:    Leone Haven, RN Phone Number: 03/25/2023, 5:18 PM  Clinical Narrative:                 From home alone, she has PCP, [facility], she has insurance on file. She currently does not have any HH services in place. Per pt eval rec HHPT, NCM offered choice, she does not have a preference. She states she needs a tub bench and a rolling walker, she does not have a preference for the agency. NCM made referral to [facility] with [facility] for HHPT, he is able to take referral. Soc will begin 24 to 48 hrs post dc. NCM made referral to [facility] with [employer] for the tub bench and rolling walker. Patient states her daughter will transport her home at dc, her daughter is her support system, she gets her medications from [employer]. Pta self ambulatory.   Expected Discharge Plan: Home w Home Health Services Barriers to Discharge: Continued Medical Work up   Patient Goals and CMS Choice Patient states their goals for this hospitalization and ongoing recovery are:: return home CMS Medicare.gov Compare Post Acute Care list provided to:: Patient Choice offered to / list presented to : Patient      Expected Discharge Plan and Services In-house Referral: NA Discharge Planning Services: CM Consult Post Acute Care Choice: Home Health Living arrangements for the past 2 months: Apartment                 DME Arranged: Tub bench, Walker rolling DME Agency: [employer] Date DME Agency Contacted: 03/25/23 Time DME Agency Contacted: 1717 Representative spoke with at DME Agency: Vaughan Basta HH Arranged: PT HH Agency: [facility] Date [facility] Agency Contacted: 03/25/23 Time HH Agency Contacted: 1717 Representative spoke with at [facility] Agency: Kandee Keen  Prior Living Arrangements/Services Living  arrangements for the past 2 months: Apartment Lives with:: Self Patient language and need for interpreter reviewed:: Yes Do you feel safe going back to the place where you live?: Yes      Need for Family Participation in Patient Care: Yes (Comment) Care giver support system in place?: Yes (comment) Current home services: DME (walker) Criminal Activity/Legal Involvement Pertinent to Current Situation/Hospitalization: No - Comment as needed  Activities of Daily Living Home Assistive Devices/Equipment: Walker (specify type) ADL Screening (condition at time of admission) Patient's cognitive ability adequate to safely complete daily activities?: Yes Is the patient deaf or have difficulty hearing?: Yes Does the patient have difficulty seeing, even when wearing glasses/contacts?: No Does the patient have difficulty concentrating, remembering, or making decisions?: No Patient able to express need for assistance with ADLs?: Yes Does the patient have difficulty dressing or bathing?: Yes Independently performs ADLs?: Yes (appropriate for developmental age) Does the patient have difficulty walking or climbing stairs?: Yes Weakness of Legs: None Weakness of Arms/Hands: None  Permission Sought/Granted                  Emotional Assessment   Attitude/Demeanor/Rapport: Engaged Affect (typically observed): Appropriate Orientation: : Oriented to Self, Oriented to Place, Oriented to  Time, Oriented to Situation Alcohol / Substance Use: Illicit Drugs Psych Involvement: No (comment)  Admission diagnosis:  Acute exacerbation of CHF (congestive heart failure) (HCC) [I50.9] Acute on chronic diastolic congestive heart failure (HCC) [I50.33] Patient Active  Problem List   Diagnosis Date Noted   Acute on chronic systolic (congestive) heart failure (HCC) 03/08/2023   Volume overload 03/07/2023   Heme positive stool 11/06/2022   Anemia due to chronic blood loss 11/06/2022   Colon cancer screening  11/03/2022   Hypoglycemia 11/02/2022   MDD (major depressive disorder), recurrent episode, moderate (HCC) 11/02/2022   Abnormal barium swallow 11/02/2022   Esophageal stricture 11/02/2022   Esophageal dysmotility 11/02/2022   Antiplatelet or antithrombotic long-term use 11/02/2022   Chronic diastolic CHF (congestive heart failure) (HCC) 11/01/2022   Dysphagia 11/01/2022   Acute exacerbation of CHF (congestive heart failure) (HCC) 05/09/2022   High anion gap metabolic acidosis 05/09/2022   Acute on chronic diastolic (congestive) heart failure (HCC) 03/22/2022   COPD not affecting current episode of care 03/22/2022   Class 2 obesity due to excess calories with body mass index (BMI) of 37.0 to 37.9 in adult 03/22/2022   CHF (congestive heart failure) (HCC) 03/14/2022   borderline Prolonged QT interval 01/22/2022   Thyroid nodule 01/22/2022   Cocaine use disorder, mild, abuse (HCC) 01/02/2022   Diabetic hypoglycemia (HCC) 12/31/2021   CKD (chronic kidney disease) stage 4, GFR 15-29 ml/min (HCC) 12/31/2021   CAD in native artery 12/24/2021   Anxiety with depression 12/24/2021   Pure hypercholesterolemia    Diabetes mellitus (HCC) 01/29/2014   Essential hypertension 01/29/2014   Closed jaw fracture (HCC) 01/29/2014   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   PCP:  Pa, [facility] Pharmacy:   [facility] [place], [place] - [address] [place] [address] Phone: [phone] Fax: [phone]  [facility] [address] [place] [address] Phone: [phone] Fax: [phone]     Social Determinants of Health (SDOH) Social History: SDOH Screenings   Food Insecurity: No Food Insecurity (03/22/2023)  Housing: Low Risk  (03/22/2023)  Transportation Needs: No Transportation Needs (03/22/2023)  Utilities: Not At Risk (03/22/2023)  Financial Resource Strain: Low Risk  (04/04/2022)  Tobacco Use:  High Risk (03/22/2023)   SDOH Interventions:     Readmission Risk Interventions    03/25/2023    4:51 PM 03/27/2022   10:36 AM  Readmission Risk Prevention Plan  Transportation Screening Complete Complete  Medication Review (RN Care Manager) Complete Complete  PCP or Specialist appointment within 3-5 days of discharge  Complete  HRI or Home Care Consult Complete Complete  SW Recovery Care/Counseling Consult  Complete  Palliative Care Screening Not Applicable Not Applicable  Skilled Nursing Facility Not Applicable Not Applicable

## 2023-03-25 NOTE — Progress Notes (Signed)
Mobility Specialist: Progress Note   03/25/23 1152  Mobility  Activity Ambulated with assistance in hallway  Level of Assistance Contact guard assist, steadying assist  Assistive Device Front wheel walker  Distance Ambulated (ft) 100 ft  Activity Response Tolerated well  Mobility Referral Yes  $Mobility charge 1 Mobility  Mobility Specialist Start Time (ACUTE ONLY) 1140  Mobility Specialist Stop Time (ACUTE ONLY) 1152  Mobility Specialist Time Calculation (min) (ACUTE ONLY) 12 min   Pt was agreeable to mobility session - received in bed. SV for STS, minG for ambulation. No c/o throughout, but at EOS stated she felt a little "woozy". Left in bed with all needs met, call bell in reach and family in room.   Maurene Capes Mobility Specialist Please contact via SecureChat or Rehab office at [phone]

## 2023-03-25 NOTE — Progress Notes (Signed)
OT Cancellation Note  Patient Details Name: Carol [name] MRN: [id number] DOB: [birth year]   Cancelled Treatment:    Reason Eval/Treat Not Completed: Other (comment) (Patient sleeping when attempted OT treatment. Will re-attempt as able)  Denice Paradise 03/25/2023, 4:50 PM

## 2023-03-25 NOTE — Progress Notes (Signed)
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  In the ED volume overloaded wheezing, BNP> 2400, chest x-ray with worsening interstitial edema, creatinine 4.0, hemoglobin 9.2, troponin negative  Subjective: -Feels better overall, swelling improving  Assessment and Plan:  Acute on chronic diastolic CHF CKD 4 -Admitted with significant volume overload -Last echo 7/24 with EF of 60-65%, grade 2 diastolic dysfunction, normal RV, moderately elevated PA pressures  -Limited response to diuretics thus far, 1.6 L negative -Continue IV Lasix 80 Mg 3 times daily with albumin, may need increased dose -Unna boots placed -Appreciate nephrology input, BMP in a.m. -Increase activity  CKD 4 Hypoalbuminemia -With massive volume overload, see discussion above -Increase protein intake  Anemia of chronic disease and iron deficiency -Iron stores low, given IV iron 8/4  DM 2 -CBGs are stable, continue sliding scale insulin, A1c is 5.7   COPD -Stable, continue nebs    QTc prolongation -Potassium is acceptable, repleted mag   Cocaine abuse, -Counseled   Hyperlipidemia Resume home Lipitor  History of sarcoidosis affecting liver -Cardiac MRI was negative for sarcoidosis -Did not follow-up at [facility]     DVT prophylaxis: Subcu heparin 3 times daily   Code Status: Full code   Family Communication: Fianc at bedside.   Disposition Plan: Home likely 2 to 3 days  Consultants: Renal   Procedures:   Antimicrobials:    Objective: Vitals:   03/25/23 0450 03/25/23 0806 03/25/23 0808  03/25/23 0825  BP: (!) 190/99   (!) 187/92  Pulse: 72   79  Resp: 18   18  Temp: 98.4 F (36.9 C)   97.8 F (36.6 C)  TempSrc: Oral   Oral  SpO2: 98% 97% 98% 97%  Weight: 101.7 kg     Height:        Intake/Output Summary (Last 24 hours) at 03/25/2023 0957 Last data filed at 03/25/2023 0455 Gross per 24 hour  Intake 970 ml  Output 3100 ml  Net -2130 ml   Filed Weights   03/23/23 0410 03/24/23 0500 03/25/23 0450  Weight: 102.3 kg 102.1 kg 101.7 kg    Examination:  General exam: Chronically ill female laying in bed, AAOx3 HEENT: Positive JVD CVS: S1-S2, regular rhythm Lungs: Bilateral Rales Abdomen: Distended, soft, nontender, bowel sounds present Extremities: 2+ edema extending to upper thighs, Unna boots Psychiatry:  Mood & affect appropriate.     Data Reviewed:   CBC: Recent Labs  Lab 03/22/23 1154 03/22/23 1209 03/23/23 0105 03/24/23 0115 03/25/23 0142  WBC 7.1  --  3.8* 7.6 9.3  NEUTROABS 5.0  --   --   --   --   HGB 9.2* 9.9* 8.4* 7.6* 7.8*  HCT 28.9* 29.0* 25.5* 23.2* 22.8*  MCV 89.2  --  86.1 86.2 86.7  PLT 247  --  220 210 205   Basic Metabolic Panel: Recent Labs  Lab 03/22/23 1154 03/22/23 1209 03/23/23 0105 03/23/23 1216 03/24/23 0804 03/25/23 0142  NA 135 136 132* 133* 135 134*  K 3.8 3.9 4.0 4.0 3.9 3.6  CL 107  --  105 102 105 105  CO2 17*  --  16* 15*  19* 18*  GLUCOSE 92  --  199* 214* 109* 106*  BUN 37*  --  45* 48* 53* 54*  CREATININE 4.00*  --  4.22* 4.25* 4.32* 4.23*  CALCIUM 8.4*  --  8.3* 8.6* 8.8* 9.0  MG  --   --  1.7  --   --   --   PHOS  --   --  4.6  --   --   --    GFR: Estimated Creatinine Clearance: 16.4 mL/min (A) (by C-G formula based on SCr of 4.23 mg/dL (H)). Liver Function Tests: Recent Labs  Lab 03/22/23 1154 03/23/23 0105  AST 20  --   ALT 13  --   ALKPHOS 145*  --   BILITOT 0.8  --   PROT 6.4*  --   ALBUMIN 2.4* 2.2*   No results for input(s): "LIPASE", "AMYLASE" in the last 168 hours. No results  for input(s): "AMMONIA" in the last 168 hours. Coagulation Profile: Recent Labs  Lab 03/22/23 1154  INR 1.1   Cardiac Enzymes: No results for input(s): "CKTOTAL", "CKMB", "CKMBINDEX", "TROPONINI" in the last 168 hours. BNP (last 3 results) No results for input(s): "PROBNP" in the last 8760 hours. HbA1C: Recent Labs    03/24/23 0115  HGBA1C 5.7*   CBG: Recent Labs  Lab 03/24/23 0606 03/24/23 1207 03/24/23 1523 03/24/23 2140 03/25/23 0623  GLUCAP 133* 109* 107* 119* 96   Lipid Profile: No results for input(s): "CHOL", "HDL", "LDLCALC", "TRIG", "CHOLHDL", "LDLDIRECT" in the last 72 hours. Thyroid Function Tests: No results for input(s): "TSH", "T4TOTAL", "FREET4", "T3FREE", "THYROIDAB" in the last 72 hours. Anemia Panel: Recent Labs    03/23/23 1216  VITAMINB12 309  FOLATE 7.6  FERRITIN 100  TIBC 332  IRON 30  RETICCTPCT 1.9   Urine analysis:    Component Value Date/Time   COLORURINE YELLOW 03/08/2023 0517   APPEARANCEUR HAZY (A) 03/08/2023 0517   LABSPEC 1.007 03/08/2023 0517   PHURINE 7.0 03/08/2023 0517   GLUCOSEU NEGATIVE 03/08/2023 0517   HGBUR NEGATIVE 03/08/2023 0517   BILIRUBINUR NEGATIVE 03/08/2023 0517   KETONESUR NEGATIVE 03/08/2023 0517   PROTEINUR >=300 (A) 03/08/2023 0517   UROBILINOGEN 0.2 01/29/2014 2116   NITRITE NEGATIVE 03/08/2023 0517   LEUKOCYTESUR TRACE (A) 03/08/2023 0517   Sepsis Labs: @LABRCNTIP (procalcitonin:4,lacticidven:4)  )No results found for this or any previous visit (from the past 240 hour(s)).   Radiology Studies: No results found.   Scheduled Meds:  atorvastatin  80 mg Oral Daily   darbepoetin (ARANESP) injection - NON-DIALYSIS  200 mcg Subcutaneous Q Mon-1800   furosemide  80 mg Intravenous TID   heparin  5,000 Units Subcutaneous Q8H   hydrALAZINE  50 mg Oral TID   insulin aspart  0-15 Units Subcutaneous TID WC   ipratropium-albuterol  3 mL Nebulization BID   isosorbide mononitrate  30 mg Oral Daily    mometasone-formoterol  2 puff Inhalation BID   nebivolol  5 mg Oral Daily   Continuous Infusions:     LOS: 3 days    Time spent:    Zannie Cove, MD [facility]   03/25/2023, 9:57 AM

## 2023-03-25 NOTE — Progress Notes (Signed)
Heart Failure Navigator Progress Note  Assessed for Heart & Vascular TOC clinic readiness.  Patient does not meet criteria due to Advanced Heart Failure Team patient.   Navigator will sign off at this time.     , BSN, RN Heart Failure Nurse Navigator Secure Chat Only   

## 2023-03-25 NOTE — Plan of Care (Signed)

## 2023-03-25 NOTE — Plan of Care (Signed)

## 2023-03-25 NOTE — TOC Initial Note (Deleted)
Transition of Care [facility]) - Initial/Assessment Note    Patient Details  Name: Carol [name] MRN: [id number] Date of Birth: 04-Jun-1961  Transition of Care [facility]) CM/SW Contact:    Leone Haven, RN Phone Number: 03/25/2023, 5:05 PM  Clinical Narrative:                 From home alone, she has PCP, [facility], she has insurance on file.  She currently does not have any HH services in place. Per pt eval rec HHPT, NCM offered choice, she does not have a preference.   She states she needs a tub bench and a rolling walker, she does not have a preference for the agency. NCM made referral to [facility] with [facility] for HHPT, he is able to take referral.  Soc will begin 24 to 48 hrs post dc.  NCM made referral to [facility] with [employer] for the tub bench and rolling walker. Patient states her daughter will transport her home at dc, her daughter is her support system, she gets her medications from [facility]. Pta self ambulatory.    Expected Discharge Plan: Home w Home Health Services Barriers to Discharge: Continued Medical Work up   Patient Goals and CMS Choice Patient states their goals for this hospitalization and ongoing recovery are:: return home CMS Medicare.gov Compare Post Acute Care list provided to:: Patient Choice offered to / list presented to : Patient      Expected Discharge Plan and Services In-house Referral: NA Discharge Planning Services: CM Consult Post Acute Care Choice: Home Health Living arrangements for the past 2 months: Apartment                 DME Arranged: Tub bench, Walker rolling DME Agency: [employer] Date DME Agency Contacted: 03/25/23 Time DME Agency Contacted: 1704 Representative spoke with at DME Agency: Vaughan Basta HH Arranged: PT HH Agency: [facility] Date [facility] Agency Contacted: 03/25/23 Time HH Agency Contacted: 1704 Representative spoke with at [facility] Agency: Kandee Keen  Prior Living Arrangements/Services Living  arrangements for the past 2 months: Apartment Lives with:: Self Patient language and need for interpreter reviewed:: Yes Do you feel safe going back to the place where you live?: Yes      Need for Family Participation in Patient Care: Yes (Comment) Care giver support system in place?: Yes (comment)   Criminal Activity/Legal Involvement Pertinent to Current Situation/Hospitalization: No - Comment as needed  Activities of Daily Living Home Assistive Devices/Equipment: Walker (specify type) ADL Screening (condition at time of admission) Patient's cognitive ability adequate to safely complete daily activities?: Yes Is the patient deaf or have difficulty hearing?: Yes Does the patient have difficulty seeing, even when wearing glasses/contacts?: No Does the patient have difficulty concentrating, remembering, or making decisions?: No Patient able to express need for assistance with ADLs?: Yes Does the patient have difficulty dressing or bathing?: Yes Independently performs ADLs?: Yes (appropriate for developmental age) Does the patient have difficulty walking or climbing stairs?: Yes Weakness of Legs: None Weakness of Arms/Hands: None  Permission Sought/Granted                  Emotional Assessment   Attitude/Demeanor/Rapport: Engaged Affect (typically observed): Appropriate Orientation: : Oriented to Self, Oriented to Place, Oriented to  Time, Oriented to Situation Alcohol / Substance Use: Not Applicable Psych Involvement: No (comment)  Admission diagnosis:  Acute exacerbation of CHF (congestive heart failure) (HCC) [I50.9] Acute on chronic diastolic congestive heart failure (HCC) [  I50.33] Patient Active Problem List   Diagnosis Date Noted   Acute on chronic systolic (congestive) heart failure (HCC) 03/08/2023   Volume overload 03/07/2023   Heme positive stool 11/06/2022   Anemia due to chronic blood loss 11/06/2022   Colon cancer screening 11/03/2022   Hypoglycemia  11/02/2022   MDD (major depressive disorder), recurrent episode, moderate (HCC) 11/02/2022   Abnormal barium swallow 11/02/2022   Esophageal stricture 11/02/2022   Esophageal dysmotility 11/02/2022   Antiplatelet or antithrombotic long-term use 11/02/2022   Chronic diastolic CHF (congestive heart failure) (HCC) 11/01/2022   Dysphagia 11/01/2022   Acute exacerbation of CHF (congestive heart failure) (HCC) 05/09/2022   High anion gap metabolic acidosis 05/09/2022   Acute on chronic diastolic (congestive) heart failure (HCC) 03/22/2022   COPD not affecting current episode of care 03/22/2022   Class 2 obesity due to excess calories with body mass index (BMI) of 37.0 to 37.9 in adult 03/22/2022   CHF (congestive heart failure) (HCC) 03/14/2022   borderline Prolonged QT interval 01/22/2022   Thyroid nodule 01/22/2022   Cocaine use disorder, mild, abuse (HCC) 01/02/2022   Diabetic hypoglycemia (HCC) 12/31/2021   CKD (chronic kidney disease) stage 4, GFR 15-29 ml/min (HCC) 12/31/2021   CAD in native artery 12/24/2021   Anxiety with depression 12/24/2021   Pure hypercholesterolemia    Diabetes mellitus (HCC) 01/29/2014   Essential hypertension 01/29/2014   Closed jaw fracture (HCC) 01/29/2014   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   PCP:  Pa, [facility] Pharmacy:   [facility] [place] [place] - [address] [place] [address] Phone: [phone] Fax: [phone]  [facility] [address] [place] [address] Phone: [phone] Fax: [phone]     Social Determinants of Health (SDOH) Social History: SDOH Screenings   Food Insecurity: No Food Insecurity (03/22/2023)  Housing: Low Risk  (03/22/2023)  Transportation Needs: No Transportation Needs (03/22/2023)  Utilities: Not At Risk (03/22/2023)  Financial Resource Strain: Low Risk  (04/04/2022)  Tobacco Use: High Risk (03/22/2023)   SDOH  Interventions:     Readmission Risk Interventions    03/25/2023    4:51 PM 03/27/2022   10:36 AM  Readmission Risk Prevention Plan  Transportation Screening Complete Complete  Medication Review (RN Care Manager) Complete Complete  PCP or Specialist appointment within 3-5 days of discharge  Complete  HRI or Home Care Consult Complete Complete  SW Recovery Care/Counseling Consult  Complete  Palliative Care Screening Not Applicable Not Applicable  Skilled Nursing Facility Not Applicable Not Applicable

## 2023-03-26 DIAGNOSIS — I5033 Acute on chronic diastolic (congestive) heart failure: Secondary | ICD-10-CM | POA: Diagnosis not present

## 2023-03-26 LAB — GLUCOSE, CAPILLARY
Glucose-Capillary: 114 mg/dL — ABNORMAL HIGH (ref 70–99)
Glucose-Capillary: 150 mg/dL — ABNORMAL HIGH (ref 70–99)
Glucose-Capillary: 92 mg/dL (ref 70–99)
Glucose-Capillary: 144 mg/dL — ABNORMAL HIGH (ref 70–99)

## 2023-03-26 MED ORDER — DIPHENHYDRAMINE HCL 25 MG PO CAPS
25.0000 mg | ORAL_CAPSULE | Freq: Once | ORAL | Status: AC | PRN
  Administered 2023-03-26: 25 mg via ORAL
  Filled 2023-03-26: qty 1

## 2023-03-26 MED ORDER — HYDRALAZINE HCL 20 MG/ML IJ SOLN: 5.0000 mg | Freq: Four times a day (QID) | INTRAMUSCULAR | Status: DC | PRN

## 2023-03-26 MED ORDER — POTASSIUM CHLORIDE CRYS ER 20 MEQ PO TBCR
40.0000 meq | EXTENDED_RELEASE_TABLET | Freq: Once | ORAL | Status: AC
  Administered 2023-03-26: 40 meq via ORAL
  Filled 2023-03-26: qty 2

## 2023-03-26 MED ORDER — HYDRALAZINE HCL 20 MG/ML IJ SOLN
5.0000 mg | Freq: Once | INTRAMUSCULAR | Status: DC | PRN
  Administered 2023-03-27 (×3): 5 mg via INTRAVENOUS
  Filled 2023-03-26 (×3): qty 1

## 2023-03-26 MED ORDER — SODIUM CHLORIDE 0.9 % IV SOLN
510.0000 mg | Freq: Once | INTRAVENOUS | Status: AC
  Administered 2023-03-27: 510 mg via INTRAVENOUS
  Filled 2023-03-26: qty 17

## 2023-03-26 MED ORDER — CAMPHOR-MENTHOL 0.5-0.5 % EX LOTN
TOPICAL_LOTION | CUTANEOUS | Status: DC | PRN
  Filled 2023-03-26: qty 222

## 2023-03-26 NOTE — Progress Notes (Signed)
[facility] Progress Note   Assessment/ Plan:   CKD IV: appears progressive, Cr was in the mid-3s in March and is now up to 4.  Has nephrotic range proteinuria             - doubt active GN and UA has inactive sediment             - suspect HTN, DM, polysubstance abuse             - do not think bx is necessary             - will send SPEP and free light chains- pending             - Lasix to 80 TID, albumin x 4 doses-  completed             - may be better to d/c on torsemide d/t hypoalbuminemia             - renal US in march with medicorenal disease, dont' think we need to repeat again             - no need for RRT at present but likely will approach this in her lifetime--> mother was on dialysis-  is not seeing nephrology -  will need to at discharge-  needs I would say at least 48 hours of continued diuresis before discharge    2.  Acute on chronic CHF exacerbation             - TTE 02/2023 with EF 6065% and G2DD             - Lasix as above-  need to continue IV   3.  HTN:             - pretty hypertensive             - I'll put back on her bystolic at half dose to allow [place] to diurese  - hydralazine also appears to have been added last night   4.  Anemia:  - iron deficient, getting feraheme-  will need second dose this week.  Also add esa      Subjective:    Seen in room.   2.8 liters of urine-  numbers basically stable . Still hypertensive -  not uremic but tearful about her situation   Objective:   BP (!) 189/91 (BP Location: Left Wrist)   Pulse 77   Temp 97.8 F (36.6 C) (Oral)   Resp 18   Ht 5\' 4"  (1.626 m)   Wt 98.7 kg   SpO2 97%   BMI 37.37 kg/m   Intake/Output Summary (Last 24 hours) at 03/26/2023 0853 Last data filed at 03/26/2023 1610 Gross per 24 hour  Intake 857.1 ml  Output 2800 ml  Net -1942.9 ml   Weight change: -2.994 kg  Physical Exam: GEN NAD, sitting up in bed HEENT EOMI PERRL NECK no overt JVD PULM clear, not on O2 CV  RRR ABD soft EXT 2+ LE edema with unna boots in place, also hip and abdominal wall edema  NEURO AAO x 3  SKIN no rashes or lesions  Imaging: No results found.  Labs: BMET Recent Labs  Lab 03/22/23 1154 03/22/23 1209 03/23/23 0105 03/23/23 1216 03/24/23 0804 03/25/23 0142 03/26/23 0025  NA 135 136 132* 133* 135 134* 134*  K 3.8 3.9 4.0 4.0 3.9 3.6 3.7  CL 107  --  105 102 105 105 105  CO2 17*  --  16* 15* 19* 18* 17*  GLUCOSE 92  --  199* 214* 109* 106* 124*  BUN 37*  --  45* 48* 53* 54* 57*  CREATININE 4.00*  --  4.22* 4.25* 4.32* 4.23* 4.27*  CALCIUM 8.4*  --  8.3* 8.6* 8.8* 9.0 9.2  PHOS  --   --  4.6  --   --   --   --    CBC Recent Labs  Lab 03/22/23 1154 03/22/23 1209 03/23/23 0105 03/24/23 0115 03/25/23 0142 03/26/23 0025  WBC 7.1  --  3.8* 7.6 9.3 8.3  NEUTROABS 5.0  --   --   --   --   --   HGB 9.2*   < > 8.4* 7.6* 7.8* 7.6*  HCT 28.9*   < > 25.5* 23.2* 22.8* 23.3*  MCV 89.2  --  86.1 86.2 86.7 85.0  PLT 247  --  220 210 205 191   < > = values in this interval not displayed.    Medications:     atorvastatin  80 mg Oral Daily   darbepoetin (ARANESP) injection - NON-DIALYSIS  200 mcg Subcutaneous Q Mon-1800   furosemide  80 mg Intravenous TID   heparin  5,000 Units Subcutaneous Q8H   hydrALAZINE  50 mg Oral TID   insulin aspart  0-15 Units Subcutaneous TID WC   ipratropium-albuterol  3 mL Nebulization BID   isosorbide mononitrate  30 mg Oral Daily   mometasone-formoterol  2 puff Inhalation BID   nebivolol  5 mg Oral Daily     A   03/26/2023, 8:53 AM

## 2023-03-26 NOTE — Progress Notes (Signed)
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  In the ED volume overloaded wheezing, BNP> 2400, chest x-ray with worsening interstitial edema, creatinine 4.0, hemoglobin 9.2, troponin negative  Subjective: -Feels better overall, swelling continues to slowly improve  Assessment and Plan:  Acute on chronic diastolic CHF CKD 4 -Admitted with significant volume overload -Last echo 7/24 with EF of 60-65%, grade 2 diastolic dysfunction, normal RV, moderately elevated PA pressures  -Slowly improving on diuretics, at least 3.7 L negative, weight down 8 LB, creatinine stable at 4.2 -Continue IV Lasix 80 Mg 3 times daily with albumin -Unna boots  -Appreciate nephrology input, BMP in a.m. -Increase activity  CKD 4 Hypoalbuminemia -With massive volume overload, see discussion above -Increase protein intake  Anemia of chronic disease and iron deficiency -Iron stores low, given IV iron 8/4 -Add EPO  DM 2 -CBGs are stable, continue sliding scale insulin, A1c is 5.7   COPD -Stable, continue nebs    QTc prolongation -Potassium is acceptable, repleted mag   Cocaine abuse, -Counseled   Hyperlipidemia Resume home Lipitor  History of sarcoidosis affecting liver -Cardiac MRI was negative for sarcoidosis -Did not follow-up at [facility]     DVT prophylaxis: Subcu heparin 3 times daily   Code Status: Full code   Family Communication: Fianc at bedside.   Disposition Plan: Home likely 2 to 3 days  Consultants: Renal   Procedures:   Antimicrobials:    Objective: Vitals:   03/26/23  0403 03/26/23 0727 03/26/23 0729 03/26/23 0740  BP: (!) 176/84   (!) 189/91  Pulse: 72   77  Resp: 18   18  Temp: 98.2 F (36.8 C)   97.8 F (36.6 C)  TempSrc: Oral   Oral  SpO2: 96% 97% 98% 97%  Weight: 98.7 kg     Height:        Intake/Output Summary (Last 24 hours) at 03/26/2023 1127 Last data filed at 03/26/2023 8119 Gross per 24 hour  Intake 737.1 ml  Output 2100 ml  Net -1362.9 ml   Filed Weights   03/24/23 0500 03/25/23 0450 03/26/23 0403  Weight: 102.1 kg 101.7 kg 98.7 kg    Examination:  General exam: Chronically ill female laying in bed, AAOx3 HEENT: Positive JVD CVS: S1-S2, regular rhythm Lungs: Bilateral Rales Abdomen: Distended, soft, nontender, bowel sounds present Extremities: 2+ edema extending to upper thighs, Unna boots Psychiatry:  Mood & affect appropriate.     Data Reviewed:   CBC: Recent Labs  Lab 03/22/23 1154 03/22/23 1209 03/23/23 0105 03/24/23 0115 03/25/23 0142 03/26/23 0025  WBC 7.1  --  3.8* 7.6 9.3 8.3  NEUTROABS 5.0  --   --   --   --   --   HGB 9.2* 9.9* 8.4* 7.6* 7.8* 7.6*  HCT 28.9* 29.0* 25.5* 23.2* 22.8* 23.3*  MCV 89.2  --  86.1 86.2 86.7 85.0  PLT 247  --  220 210 205 191   Basic Metabolic Panel: Recent Labs  Lab 03/23/23 0105 03/23/23 1216 03/24/23 0804 03/25/23 0142 03/26/23 0025  NA 132* 133* 135 134* 134*  K 4.0 4.0 3.9 3.6 3.7  CL 105  102 105 105 105  CO2 16* 15* 19* 18* 17*  GLUCOSE 199* 214* 109* 106* 124*  BUN 45* 48* 53* 54* 57*  CREATININE 4.22* 4.25* 4.32* 4.23* 4.27*  CALCIUM 8.3* 8.6* 8.8* 9.0 9.2  MG 1.7  --   --   --   --   PHOS 4.6  --   --   --   --    GFR: Estimated Creatinine Clearance: 16 mL/min (A) (by C-G formula based on SCr of 4.27 mg/dL (H)). Liver Function Tests: Recent Labs  Lab 03/22/23 1154 03/23/23 0105  AST 20  --   ALT 13  --   ALKPHOS 145*  --   BILITOT 0.8  --   PROT 6.4*  --   ALBUMIN 2.4* 2.2*   No results for input(s): "LIPASE", "AMYLASE" in the last 168  hours. No results for input(s): "AMMONIA" in the last 168 hours. Coagulation Profile: Recent Labs  Lab 03/22/23 1154  INR 1.1   Cardiac Enzymes: No results for input(s): "CKTOTAL", "CKMB", "CKMBINDEX", "TROPONINI" in the last 168 hours. BNP (last 3 results) No results for input(s): "PROBNP" in the last 8760 hours. HbA1C: Recent Labs    03/24/23 0115  HGBA1C 5.7*   CBG: Recent Labs  Lab 03/25/23 1117 03/25/23 1605 03/25/23 2155 03/26/23 0607 03/26/23 1101  GLUCAP 113* 162* 149* 114* 150*   Lipid Profile: No results for input(s): "CHOL", "HDL", "LDLCALC", "TRIG", "CHOLHDL", "LDLDIRECT" in the last 72 hours. Thyroid Function Tests: No results for input(s): "TSH", "T4TOTAL", "FREET4", "T3FREE", "THYROIDAB" in the last 72 hours. Anemia Panel: Recent Labs    03/23/23 1216  VITAMINB12 309  FOLATE 7.6  FERRITIN 100  TIBC 332  IRON 30  RETICCTPCT 1.9   Urine analysis:    Component Value Date/Time   COLORURINE YELLOW 03/08/2023 0517   APPEARANCEUR HAZY (A) 03/08/2023 0517   LABSPEC 1.007 03/08/2023 0517   PHURINE 7.0 03/08/2023 0517   GLUCOSEU NEGATIVE 03/08/2023 0517   HGBUR NEGATIVE 03/08/2023 0517   BILIRUBINUR NEGATIVE 03/08/2023 0517   KETONESUR NEGATIVE 03/08/2023 0517   PROTEINUR >=300 (A) 03/08/2023 0517   UROBILINOGEN 0.2 01/29/2014 2116   NITRITE NEGATIVE 03/08/2023 0517   LEUKOCYTESUR TRACE (A) 03/08/2023 0517   Sepsis Labs: @LABRCNTIP (procalcitonin:4,lacticidven:4)  )No results found for this or any previous visit (from the past 240 hour(s)).   Radiology Studies: No results found.   Scheduled Meds:  atorvastatin  80 mg Oral Daily   darbepoetin (ARANESP) injection - NON-DIALYSIS  200 mcg Subcutaneous Q Mon-1800   furosemide  80 mg Intravenous TID   heparin  5,000 Units Subcutaneous Q8H   hydrALAZINE  50 mg Oral TID   insulin aspart  0-15 Units Subcutaneous TID WC   ipratropium-albuterol  3 mL Nebulization BID   isosorbide mononitrate  30 mg  Oral Daily   mometasone-formoterol  2 puff Inhalation BID   nebivolol  5 mg Oral Daily   potassium chloride  40 mEq Oral Once   Continuous Infusions:  albumin human 25 g (03/26/23 0826)   [START ON 03/27/2023] ferumoxytol (FERAHEME) 510 mg in sodium chloride 0.9 % 100 mL IVPB        LOS: 4 days    Time spent:    Zannie Cove, MD [facility]   03/26/2023, 11:27 AM

## 2023-03-26 NOTE — Plan of Care (Signed)

## 2023-03-26 NOTE — Progress Notes (Signed)
Physical Therapy Treatment Patient Details Name: Carol [name] MRN: [id number] DOB: Jul 22, 1961 Today's Date: 03/26/2023   History of Present Illness 62 y.o. female who presents with sudden onset shortness of breath 8/2. PMHx: current cocaine abuse, chronic HFpEF, COPD, NSTEMI, Heart murmur, DM, CTR, sarcoidosis affecting liver, EtOH, polysubstance abuse, HTN, DM, volume overload, LE edema    PT Comments  Pt received in supine and agreeable to session. Pt able to perform all mobility with up to min guard for safety, however remains limited by impaired activity tolerance. Pt performing multiple stands from EOB due to need to urinate prior to ambulation. Pt able to tolerate increased gait distance this session with heavy reliance on RW support due to pt reporting that her BLE "feel heavy". Pt continues to benefit from PT services to progress toward functional mobility goals.     If plan is discharge home, recommend the following: A little help with walking and/or transfers;A little help with bathing/dressing/bathroom;Assistance with cooking/housework;Assist for transportation   Can travel by private vehicle        Equipment Recommendations  Rolling walker (2 wheels)    Recommendations for Other Services       Precautions / Restrictions Precautions Precautions: Fall Precaution Comments: monitor vitals Restrictions Weight Bearing Restrictions: No     Mobility  Bed Mobility Overal bed mobility: Modified Independent                  Transfers Overall transfer level: Needs assistance Equipment used: Rolling walker (2 wheels) Transfers: Sit to/from Stand Sit to Stand: Supervision           General transfer comment: from low EOB with cues for hand placement    Ambulation/Gait Ambulation/Gait assistance: Min guard Gait Distance (Feet): 80 Feet Assistive device: Rolling walker (2 wheels) Gait Pattern/deviations: Step-through pattern, Decreased stride length,  Trunk flexed Gait velocity: reduced     General Gait Details: Slow step-through pattern with heavy UE support on RW. Cues for upright posture.       Balance Overall balance assessment: Needs assistance Sitting-balance support: Feet supported Sitting balance-Leahy Scale: Good Sitting balance - Comments: sitting EOB   Standing balance support: Bilateral upper extremity supported, During functional activity, Reliant on assistive device for balance Standing balance-Leahy Scale: Poor Standing balance comment: with RW support. Able to static stand without UE with no LOB, however demonstrates heavy reliance on RW during ambulation                            Cognition Arousal/Alertness: Awake/alert Behavior During Therapy: WFL for tasks assessed/performed Overall Cognitive Status: Within Functional Limits for tasks assessed                                          Exercises      General Comments General comments (skin integrity, edema, etc.): Pt reports some dizziness during ambulation that she relates to her headache      Pertinent Vitals/Pain Pain Assessment Pain Assessment: Faces Faces Pain Scale: Hurts little more Pain Location: headache Pain Descriptors / Indicators: Aching Pain Intervention(s): Monitored during session     PT Goals (current goals can now be found in the care plan section) Acute Rehab PT Goals Patient Stated Goal: to get stronger and have nursing assist at home with HHPT PT Goal Formulation: With patient/family Time For Goal  Achievement: 03/30/23 Potential to Achieve Goals: Good Progress towards PT goals: Progressing toward goals    Frequency    Min 1X/week      PT Plan Current plan remains appropriate       AM-PAC PT "6 Clicks" Mobility   Outcome Measure  Help needed turning from your back to your side while in a flat bed without using bedrails?: None Help needed moving from lying on your back to sitting on  the side of a flat bed without using bedrails?: None Help needed moving to and from a bed to a chair (including a wheelchair)?: A Little Help needed standing up from a chair using your arms (e.g., wheelchair or bedside chair)?: A Little Help needed to walk in hospital room?: A Little Help needed climbing 3-5 steps with a railing? : A Lot 6 Click Score: 19    End of Session Equipment Utilized During Treatment: Gait belt Activity Tolerance: Patient tolerated treatment well Patient left: in chair;with call bell/phone within reach;with family/visitor present Nurse Communication: Mobility status PT Visit Diagnosis: Muscle weakness (generalized) (M62.81);History of falling (Z91.81)     Time: 7829-5621 PT Time Calculation (min) (ACUTE ONLY): 20 min  Charges:    $Gait Training: 8-22 mins PT General Charges $$ ACUTE PT VISIT: 1 Visit                     Johny Shock, PTA Acute Rehabilitation Services Secure Chat Preferred  [phone]    Johny Shock 03/26/2023, 10:20 AM

## 2023-03-27 ENCOUNTER — Encounter (HOSPITAL_COMMUNITY): Payer: 59

## 2023-03-27 DIAGNOSIS — I2510 Atherosclerotic heart disease of native coronary artery without angina pectoris: Secondary | ICD-10-CM

## 2023-03-27 DIAGNOSIS — J42 Unspecified chronic bronchitis: Secondary | ICD-10-CM

## 2023-03-27 DIAGNOSIS — I5033 Acute on chronic diastolic (congestive) heart failure: Secondary | ICD-10-CM | POA: Diagnosis not present

## 2023-03-27 DIAGNOSIS — E785 Hyperlipidemia, unspecified: Secondary | ICD-10-CM

## 2023-03-27 DIAGNOSIS — I10 Essential (primary) hypertension: Secondary | ICD-10-CM | POA: Diagnosis not present

## 2023-03-27 DIAGNOSIS — N184 Chronic kidney disease, stage 4 (severe): Secondary | ICD-10-CM

## 2023-03-27 DIAGNOSIS — J449 Chronic obstructive pulmonary disease, unspecified: Secondary | ICD-10-CM

## 2023-03-27 DIAGNOSIS — E1169 Type 2 diabetes mellitus with other specified complication: Secondary | ICD-10-CM | POA: Diagnosis not present

## 2023-03-27 DIAGNOSIS — D649 Anemia, unspecified: Secondary | ICD-10-CM | POA: Insufficient documentation

## 2023-03-27 LAB — GLUCOSE, CAPILLARY
Glucose-Capillary: 123 mg/dL — ABNORMAL HIGH (ref 70–99)
Glucose-Capillary: 113 mg/dL — ABNORMAL HIGH (ref 70–99)
Glucose-Capillary: 156 mg/dL — ABNORMAL HIGH (ref 70–99)
Glucose-Capillary: 134 mg/dL — ABNORMAL HIGH (ref 70–99)

## 2023-03-27 MED ORDER — POTASSIUM CHLORIDE CRYS ER 20 MEQ PO TBCR
40.0000 meq | EXTENDED_RELEASE_TABLET | Freq: Once | ORAL | Status: AC
  Administered 2023-03-27: 40 meq via ORAL
  Filled 2023-03-27: qty 2

## 2023-03-27 MED ORDER — AMLODIPINE BESYLATE 10 MG PO TABS
10.0000 mg | ORAL_TABLET | Freq: Every day | ORAL | Status: DC
  Administered 2023-03-27 – 2023-03-29 (×3): 10 mg via ORAL
  Filled 2023-03-27 (×3): qty 1

## 2023-03-27 MED ORDER — DIPHENHYDRAMINE HCL 25 MG PO CAPS
25.0000 mg | ORAL_CAPSULE | Freq: Four times a day (QID) | ORAL | Status: DC | PRN
  Administered 2023-03-28: 25 mg via ORAL
  Filled 2023-03-27: qty 1

## 2023-03-27 MED ORDER — PANTOPRAZOLE SODIUM 40 MG PO TBEC
40.0000 mg | DELAYED_RELEASE_TABLET | Freq: Every day | ORAL | Status: DC
  Administered 2023-03-28 – 2023-03-29 (×3): 40 mg via ORAL
  Filled 2023-03-27 (×3): qty 1

## 2023-03-27 NOTE — Plan of Care (Signed)

## 2023-03-27 NOTE — Hospital Course (Addendum)
Carol [name] was admitted to the hospital with the working diagnosis of heart failure decompensation in the setting of CKD stage 4.   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 She admitted using cocaine the night prior to admission. On the day of admission she called EMS, she was found with 02 saturation 80's, and was transported to the ED. On her initial physical examination her blood pressure was 172/84, RR 25, 02 saturation 100% on Venti mask.  Lungs with mild diffuse bilateral wheezing with no rhonchi, poor inspiratory effort, heart with S1 and S2 present and regular with no gallops, abdomen with no distention and positive lower extremity edema.   Na 135, K 3,8 Cl 107 bicarbonate 17, glucose 92, bun 37 cr 4,0  BNP 2,437  High sensitive troponin 17 and 17  Wbc 7,1 hgb 9,2 plt 247   Chest radiograph with right rotation, mild cardiomegaly, bilateral hilar vascular congestion, with small bilateral pleural effusions.   EKG 71 bpm, normal axis, qtc 520, sinus rhythm with no significant ST segment or T wave changes.   Patient was placed on furosemide for diuresis.  Nephrology was consulted due to significant reduced GFR.  08/09 her volume status has improved, she has been transitioned to oral torsemide. Will need close follow up as outpatient.

## 2023-03-27 NOTE — Assessment & Plan Note (Addendum)
Patient was placed on insulin therapy sliding scale during her hospitalization. Her glucose remained stable.  Continue with linagliptin.   Continue statin therapy.

## 2023-03-27 NOTE — Progress Notes (Signed)
TRH night cross cover note:   I was notified by RN that the patient is complaining of some generalized pruritus.  She was also complaining of similar last night which responded well to a one-time dose of Benadryl.  I subsequently placed order for prn Benadryl for pruritus.  Additionally, the patient is requesting medication for acid reflux, noting that the patient is on Protonix at home.  I subsequently resumed outpatient Protonix, with first dose to occur now.    Newton Pigg, DO Hospitalist

## 2023-03-27 NOTE — Progress Notes (Signed)
  Progress Note   Patient: Carol [name] ZOX:[id number] DOB: 1961/02/24 DOA: 03/22/2023     5 DOS: the patient was seen and examined on 03/27/2023   Brief hospital course: Carol [name] was admitted to the hospital with the working diagnosis of heart failure decompensation.   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 In the ED volume overloaded wheezing, BNP> 2400, chest x-ray with worsening interstitial edema, creatinine 4.0, hemoglobin 9.2, troponin negative   Assessment and Plan: * Acute on chronic diastolic CHF (congestive heart failure) (HCC) Echocardiogram with preserved LV systolic function EF 60 to 65%, mild asymmetric left ventricular hypertrophy of the basal septal segment. RV systolic function is preserved. RVSP 49.5 mmHg. LA with moderate dilatation, no significant valvular disease.   Urine output is 2,400 cc Systolic blood pressure 178 to 159 mmHg.   Plan to continue diuresis with IV furosemide 80 mg IV q8 hrs. After load reduction with hydralazine and isosorbide.  B blocker with Nebivolol 5 mg daily.   CKD (chronic kidney disease) stage 4, GFR 15-29 ml/min (HCC) AKI. Hyponatremia  Renal function today with serum cr at 4.10 with K at 3,7 and serum bicarbonate at 18 Na 134.   Plan to continue diuresis with IV furosemide. Follow up renal function and electrolytes in am.   Anemia of chronic disease with iron deficiency.  SP IV iron and continue with EPO  Essential hypertension Continue blood pressure control with hydralazine and isosorbide.   Type 2 diabetes mellitus with hyperlipidemia (HCC) Continue glucose cover and monitoring with insulin sliding scale.   Continue  statin therapy.   CAD in native artery Continue atorvastatin.   COPD (chronic obstructive pulmonary disease) (HCC) No signs of acute exacerbation.        Subjective: patient with improvement in dyspnea and edema but not back to her baseline.   Physical Exam: Vitals:   03/27/23 0532 03/27/23 0716 03/27/23 0717 03/27/23 0740  BP: (!) 178/77   (!) 159/73  Pulse:    77  Resp:    18  Temp:    98.3 F (36.8 C)  TempSrc:    Oral  SpO2:  97% 97% 97%  Weight:      Height:       Neurology awake and alert ENT With mild pallor Cardiovascular with S1 and S2 present and regular with no gallops or murmurs Respiratory with scattered rales at bases with wheezing, or rhonchi Abdomen with no distention  Positive lower extremity edema + unna boots in place.  Data Reviewed:    Family Communication: I spoke with patient's husband at the bedside, we talked in detail about patient's condition, plan of care and prognosis and all questions were addressed.   Disposition: Status is: Inpatient Remains inpatient appropriate because: volume overload on IV furosemide   Planned Discharge Destination: Home     Author: Coralie Keens, MD 03/27/2023 11:19 AM  For on call review [email or web address].

## 2023-03-27 NOTE — Assessment & Plan Note (Addendum)
Echocardiogram with preserved LV systolic function EF 60 to 65%, mild asymmetric left ventricular hypertrophy of the basal septal segment. RV systolic function is preserved. RVSP 49.5 mmHg. LA with moderate dilatation, no significant valvular disease.   Patient was placed on furosemide IV for diuresis, negative fluid balance was achieved, -  11,965 ml, with significant improvement in her symptoms.   Patient has been transitioned to torsemide, 60 mg po bid.  After load reduction with hydralazine and isosorbide.  B blocker with Nebivolol 5 mg daily.  Blood pressure control with amlodipine.   Patient will need life style modifications and close follow up as outpatient.

## 2023-03-27 NOTE — Assessment & Plan Note (Signed)
Continue atorvastatin

## 2023-03-27 NOTE — Progress Notes (Signed)
[facility] Progress Note   Assessment/ Plan:   CKD IV: appears progressive, Cr was in the mid-3s in March and is now up to 4.  Has nephrotic range proteinuria             - doubt active GN and UA has inactive sediment             - suspect HTN, DM, polysubstance abuse             - do not think bx is necessary             - will send SPEP and free light chains- negative             - Lasix IV 80 TID,  previous albumin has been completed             - may be better to d/c on torsemide              - renal US in march with medicorenal disease, dont'  need to repeat              - no need for RRT at present but likely will approach this in her lifetime--> mother was on dialysis-  is not seeing nephrology -  will need to at discharge-  needs I would say at least another  24-48 hours of continued diuresis before discharge    2.  Acute on chronic CHF exacerbation             - TTE 02/2023 with EF 6065% and G2DD             - Lasix as above-  need to continue IV   3.  HTN:             - pretty hypertensive             -  put back on her bystolic at half dose to allow [place] to diurese  - hydralazine also appears to have been added   -is reasonable for now-  should cont to go down with continued diuresis  4.  Anemia:  - iron deficient, getting feraheme-  will need second dose this week.  Also add esa      Subjective:    Seen in room.  At least  2.4 liters of urine-  numbers basically stable-  crt a tiny bit better . Still hypertensive -  not uremic - weight trending down nicely -  is 213 today   Objective:   BP (!) 159/73 (BP Location: Left Arm)   Pulse 77   Temp 98.3 F (36.8 C) (Oral)   Resp 18   Ht 5\' 4"  (1.626 m)   Wt 96.8 kg   SpO2 97%   BMI 36.63 kg/m   Intake/Output Summary (Last 24 hours) at 03/27/2023 0858 Last data filed at 03/27/2023 0800 Gross per 24 hour  Intake 120 ml  Output 2400 ml  Net -2280 ml   Weight change: -1.948 kg  Physical Exam: GEN NAD,  sitting up in bed HEENT EOMI PERRL NECK no overt JVD PULM clear, not on O2 CV RRR ABD soft EXT 2+ LE edema with unna boots in place, also hip and abdominal wall edema  NEURO AAO x 3  SKIN no rashes or lesions  Imaging: No results found.  Labs: BMET Recent Labs  Lab 03/22/23 1154 03/22/23 1209 03/23/23 0105 03/23/23 1216 03/24/23 0804 03/25/23 0142 03/26/23 0025 03/27/23 0018  NA  135 136 132* 133* 135 134* 134* 134*  K 3.8 3.9 4.0 4.0 3.9 3.6 3.7 3.7  CL 107  --  105 102 105 105 105 103  CO2 17*  --  16* 15* 19* 18* 17* 18*  GLUCOSE 92  --  199* 214* 109* 106* 124* 139*  BUN 37*  --  45* 48* 53* 54* 57* 57*  CREATININE 4.00*  --  4.22* 4.25* 4.32* 4.23* 4.27* 4.10*  CALCIUM 8.4*  --  8.3* 8.6* 8.8* 9.0 9.2 9.4  PHOS  --   --  4.6  --   --   --   --   --    CBC Recent Labs  Lab 03/22/23 1154 03/22/23 1209 03/24/23 0115 03/25/23 0142 03/26/23 0025 03/27/23 0018  WBC 7.1   < > 7.6 9.3 8.3 8.9  NEUTROABS 5.0  --   --   --   --   --   HGB 9.2*   < > 7.6* 7.8* 7.6* 7.8*  HCT 28.9*   < > 23.2* 22.8* 23.3* 23.2*  MCV 89.2   < > 86.2 86.7 85.0 85.3  PLT 247   < > 210 205 191 199   < > = values in this interval not displayed.    Medications:     atorvastatin  80 mg Oral Daily   darbepoetin (ARANESP) injection - NON-DIALYSIS  200 mcg Subcutaneous Q Mon-1800   furosemide  80 mg Intravenous TID   heparin  5,000 Units Subcutaneous Q8H   hydrALAZINE  50 mg Oral TID   insulin aspart  0-15 Units Subcutaneous TID WC   ipratropium-albuterol  3 mL Nebulization BID   isosorbide mononitrate  30 mg Oral Daily   mometasone-formoterol  2 puff Inhalation BID   nebivolol  5 mg Oral Daily     A   03/27/2023, 8:58 AM

## 2023-03-27 NOTE — Assessment & Plan Note (Addendum)
AKI. Hyponatremia  Her volume status has improved.  Renal function at the time of her discharge with serum cr at 4.2 with K at 4,2 and serum bicarbonate at 16. BUN 56 and Na 134.  P 3.5   Transitioned to torsemide 60 mg po bid  Anemia of chronic disease with iron deficiency.  SP IV iron and continue with EPO  Follow up renal function and electrolytes as outpatient. Follow up with nephrology as outpatient.

## 2023-03-27 NOTE — Assessment & Plan Note (Signed)
Old records personally reviewed, neurology follow up from 05/2021. Seropositive ocular myasthenia gravis, no significant bulbar, or limb weakness noted.  He is not longer on Mestinon or prednisone, never treated with long term steroids sparing agent.   

## 2023-03-27 NOTE — Assessment & Plan Note (Addendum)
Continue blood pressure control with hydralazine and isosorbide.

## 2023-03-27 NOTE — Progress Notes (Signed)
Occupational Therapy Treatment Patient Details Name: Carol [name] MRN: [id number] DOB: [birth year] Today's Date: 03/27/2023   History of present illness 62 y.o. female who presents with sudden onset shortness of breath 8/2. PMHx: current cocaine abuse, chronic HFpEF, COPD, NSTEMI, Heart murmur, DM, CTR, sarcoidosis affecting liver, EtOH, polysubstance abuse, HTN, DM, volume overload, LE edema   OT comments  Pt making good progress with functional goals. Pt very pleasant, cooperative and seemed to be in good spirits. Pt and fiance report that pt has difficulty stepping in and out of tun shower at home. OT educated pt and fiance on tub shower transfer bench for home use. VSS and OT will continue to follow acutely to maximize level of function and safety      If plan is discharge home, recommend the following:  A little help with bathing/dressing/bathroom;Assistance with cooking/housework   Equipment Recommendations  None recommended by OT    Recommendations for Other Services      Precautions / Restrictions Precautions Precautions: Fall Precaution Comments: monitor vitals Restrictions Weight Bearing Restrictions: No       Mobility Bed Mobility Overal bed mobility: Modified Independent                  Transfers Overall transfer level: Needs assistance Equipment used: Rolling walker (2 wheels) Transfers: Sit to/from Stand Sit to Stand: Supervision                 Balance Overall balance assessment: Needs assistance Sitting-balance support: Feet supported       Standing balance support: Bilateral upper extremity supported, During functional activity, Reliant on assistive device for balance Standing balance-Leahy Scale: Fair                             ADL either performed or assessed with clinical judgement   ADL Overall ADL's : Needs assistance/impaired     Grooming: Wash/dry hands;Wash/dry face;Oral care;Set  up;Supervision/safety;Standing       Lower Body Bathing: Contact guard assist;Sit to/from stand Lower Body Bathing Details (indicate cue type and reason): simulated         Toilet Transfer: Supervision/safety;Ambulation;Rolling walker (2 wheels);Regular Toilet;Grab bars   Toileting- Clothing Manipulation and Hygiene: Supervision/safety;Sit to/from stand   Tub/ Shower Transfer: Contact guard assist;Ambulation;Grab bars   Functional mobility during ADLs: Contact guard assist;Supervision/safety      Extremity/Trunk Assessment Upper Extremity Assessment Upper Extremity Assessment: Overall WFL for tasks assessed   Lower Extremity Assessment Lower Extremity Assessment: Defer to PT evaluation   Cervical / Trunk Assessment Cervical / Trunk Assessment: Normal    Vision Ability to See in Adequate Light: 0 Adequate Patient Visual Report: No change from baseline     Perception     Praxis      Cognition Arousal: Alert Behavior During Therapy: WFL for tasks assessed/performed Overall Cognitive Status: Within Functional Limits for tasks assessed                                          Exercises      Shoulder Instructions       General Comments      Pertinent Vitals/ Pain       Pain Assessment Pain Assessment: 0-10 Pain Score: 7  Pain Location: back Pain Descriptors / Indicators: Aching Pain Intervention(s): Monitored during session, Repositioned  Home Living  Prior Functioning/Environment              Frequency  Min 1X/week        Progress Toward Goals  OT Goals(current goals can now be found in the care plan section)  Progress towards OT goals: Progressing toward goals     Plan      Co-evaluation                 AM-PAC OT "6 Clicks" Daily Activity     Outcome Measure   Help from another person eating meals?: None Help from another person taking care of  personal grooming?: A Little Help from another person toileting, which includes using toliet, bedpan, or urinal?: A Little Help from another person bathing (including washing, rinsing, drying)?: A Little Help from another person to put on and taking off regular upper body clothing?: None Help from another person to put on and taking off regular lower body clothing?: A Lot 6 Click Score: 19    End of Session Equipment Utilized During Treatment: Gait belt;Rolling walker (2 wheels)  OT Visit Diagnosis: Unsteadiness on feet (R26.81)   Activity Tolerance Patient tolerated treatment well   Patient Left with call bell/phone within reach;with family/visitor present;in bed   Nurse Communication          Time: 0981-1914 OT Time Calculation (min): 24 min  Charges: OT General Charges $OT Visit: 1 Visit OT Treatments $Therapeutic Activity: 8-22 mins    Galen Manila 03/27/2023, 1:26 PM

## 2023-03-27 NOTE — Progress Notes (Signed)
   03/27/23 1509  Mobility  Activity Refused mobility   Mobility Specialist: Progress Note  Pt denied mobility d/t personal issues and tiredness. Will f/u as able.  Barnie Mort Mobility Specialist Please contact via SecureChat or Rehab office at [phone]

## 2023-03-28 DIAGNOSIS — I10 Essential (primary) hypertension: Secondary | ICD-10-CM | POA: Diagnosis not present

## 2023-03-28 DIAGNOSIS — E1169 Type 2 diabetes mellitus with other specified complication: Secondary | ICD-10-CM | POA: Diagnosis not present

## 2023-03-28 DIAGNOSIS — I5033 Acute on chronic diastolic (congestive) heart failure: Secondary | ICD-10-CM | POA: Diagnosis not present

## 2023-03-28 DIAGNOSIS — N184 Chronic kidney disease, stage 4 (severe): Secondary | ICD-10-CM | POA: Diagnosis not present

## 2023-03-28 LAB — GLUCOSE, CAPILLARY
Glucose-Capillary: 119 mg/dL — ABNORMAL HIGH (ref 70–99)
Glucose-Capillary: 173 mg/dL — ABNORMAL HIGH (ref 70–99)
Glucose-Capillary: 125 mg/dL — ABNORMAL HIGH (ref 70–99)
Glucose-Capillary: 114 mg/dL — ABNORMAL HIGH (ref 70–99)

## 2023-03-28 MED ORDER — GABAPENTIN 100 MG PO CAPS
100.0000 mg | ORAL_CAPSULE | Freq: Three times a day (TID) | ORAL | Status: DC
  Administered 2023-03-28 – 2023-03-29 (×3): 100 mg via ORAL
  Filled 2023-03-28 (×3): qty 1

## 2023-03-28 MED ORDER — MAGNESIUM OXIDE -MG SUPPLEMENT 400 (240 MG) MG PO TABS
400.0000 mg | ORAL_TABLET | Freq: Two times a day (BID) | ORAL | Status: DC
  Administered 2023-03-28 – 2023-03-29 (×3): 400 mg via ORAL
  Filled 2023-03-28 (×3): qty 1

## 2023-03-28 MED ORDER — TORSEMIDE 20 MG PO TABS
60.0000 mg | ORAL_TABLET | Freq: Two times a day (BID) | ORAL | Status: DC
  Administered 2023-03-28 – 2023-03-29 (×3): 60 mg via ORAL
  Filled 2023-03-28 (×3): qty 3

## 2023-03-28 NOTE — Plan of Care (Signed)

## 2023-03-28 NOTE — Progress Notes (Signed)
   03/28/23 1545  Mobility  Activity Ambulated with assistance in hallway  Level of Assistance Contact guard assist, steadying assist  Assistive Device Front wheel walker  Distance Ambulated (ft) 150 ft  Activity Response Tolerated well  Mobility Referral Yes  $Mobility charge 1 Mobility  Mobility Specialist Start Time (ACUTE ONLY) 1515  Mobility Specialist Stop Time (ACUTE ONLY) 1528  Mobility Specialist Time Calculation (min) (ACUTE ONLY) 13 min   Mobility Specialist: Progress Note Pt received EOB, agreeable to mobility session, eager to walk. Pt c/o pressure in her feet during ambulation, otherwise asymptomatic throughout. Pt returned to EOB with all needs met, call bell within reach.   Barnie Mort Mobility Specialist Please contact via SecureChat or Rehab office at [phone]

## 2023-03-28 NOTE — Progress Notes (Signed)
[facility] Progress Note   Assessment/ Plan:   CKD IV: appears progressive, Cr was in the mid-3s in March and is now up to 4.  Has nephrotic range proteinuria             - doubt active GN and UA has inactive sediment             - suspect HTN, DM, polysubstance abuse                          - SPEP and free light chains- negative             - Lasix IV 80 TID,  previous albumin has been completed             - may be better to d/c on torsemide -  will change her over today to torsemide 80 bid                          - no need for RRT at present but likely will approach this in her lifetime--> mother was on dialysis-  is not seeing nephrology -  will need to at discharge-  change to PO diuretics today and if  numbers stay stable and can achieve diuresis I think could be discharged 8/9     2.  Acute on chronic CHF exacerbation             - TTE 02/2023 with EF 6065% and G2DD             - Lasix as above-   IV for a while-  change to torsemide today    3.  HTN:             - pretty hypertensive             -  put back on her bystolic at half dose to allow [place] to diurese  - hydralazine also appears to have been added   -is reasonable for now-  should cont to go down with continued diuresis  4.  Anemia:  - iron deficient, getting feraheme-  will need second dose this week.  Also on esa      Subjective:    Seen in room.  At least  2.6 liters of urine-  numbers basically stable. . Still hypertensive -  not uremic - weight trending down nicely -  is 211 today-  says is itching-  wants unna boots off-  asks about op pain management   Objective:   BP (!) 178/77 (BP Location: Left Arm)   Pulse 73   Temp 97.8 F (36.6 C) (Oral)   Resp 18   Ht 5\' 4"  (1.626 m)   Wt 95.8 kg   SpO2 98%   BMI 36.25 kg/m   Intake/Output Summary (Last 24 hours) at 03/28/2023 0804 Last data filed at 03/28/2023 6578 Gross per 24 hour  Intake 236.93 ml  Output 2650 ml  Net -2413.07 ml    Weight change: -1 kg  Physical Exam: GEN NAD, sitting up in bed HEENT EOMI PERRL NECK no overt JVD PULM clear, not on O2 CV RRR ABD soft EXT 2+ LE edema with unna boots in place, also hip and abdominal wall edema  NEURO AAO x 3  SKIN no rashes or lesions  Imaging: No results found.  Labs: BMET Recent Labs  Lab 03/23/23 0105 03/23/23 1216 03/24/23 4696  03/25/23 0142 03/26/23 0025 03/27/23 0018 03/28/23 0600  NA 132* 133* 135 134* 134* 134* 136  K 4.0 4.0 3.9 3.6 3.7 3.7 4.1  CL 105 102 105 105 105 103 103  CO2 16* 15* 19* 18* 17* 18* 19*  GLUCOSE 199* 214* 109* 106* 124* 139* 121*  BUN 45* 48* 53* 54* 57* 57* 54*  CREATININE 4.22* 4.25* 4.32* 4.23* 4.27* 4.10* 4.14*  CALCIUM 8.3* 8.6* 8.8* 9.0 9.2 9.4 9.4  PHOS 4.6  --   --   --   --   --   --    CBC Recent Labs  Lab 03/22/23 1154 03/22/23 1209 03/24/23 0115 03/25/23 0142 03/26/23 0025 03/27/23 0018  WBC 7.1   < > 7.6 9.3 8.3 8.9  NEUTROABS 5.0  --   --   --   --   --   HGB 9.2*   < > 7.6* 7.8* 7.6* 7.8*  HCT 28.9*   < > 23.2* 22.8* 23.3* 23.2*  MCV 89.2   < > 86.2 86.7 85.0 85.3  PLT 247   < > 210 205 191 199   < > = values in this interval not displayed.    Medications:     amLODipine  10 mg Oral Daily   atorvastatin  80 mg Oral Daily   darbepoetin (ARANESP) injection - NON-DIALYSIS  200 mcg Subcutaneous Q Mon-1800   furosemide  80 mg Intravenous TID   heparin  5,000 Units Subcutaneous Q8H   hydrALAZINE  50 mg Oral TID   insulin aspart  0-15 Units Subcutaneous TID WC   ipratropium-albuterol  3 mL Nebulization BID   isosorbide mononitrate  30 mg Oral Daily   mometasone-formoterol  2 puff Inhalation BID   nebivolol  5 mg Oral Daily   pantoprazole  40 mg Oral Daily     A   03/28/2023, 8:04 AM

## 2023-03-28 NOTE — Progress Notes (Signed)
Progress Note   Carol [name]: Carol [name] ZOX:[id number] DOB: 10-03-1960 DOA: 03/22/2023     6 DOS: the Carol [name] was seen and examined on 03/28/2023   Brief hospital course: Carol [name] was admitted to the hospital with the working diagnosis of heart failure decompensation in the setting of CKD stage 4.   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 She admitted using cocaine the night prior to admission. On the day of admission she called EMS, she was found with 02 saturation 80's, and was transported to the ED. On her initial physical examination her blood pressure was 172/84, RR 25, 02 saturation 100% on Venti mask.  Lungs with mild diffuse bilateral wheezing with no rhonchi, poor inspiratory effort, heart with S1 and S2 present and regular with no gallops, abdomen with no distention and positive lower extremity edema.   Na 135, K 3,8 Cl 107 bicarbonate 17, glucose 92, bun 37 cr 4,0  BNP 2,437  High sensitive troponin 17 and 17  Wbc 7,1 hgb 9,2 plt 247   Chest radiograph with right rotation, mild cardiomegaly, bilateral hilar vascular congestion, with small bilateral pleural effusions.   EKG 71 bpm, normal axis, qtc 520, sinus rhythm with no significant ST segment or T wave changes.   Carol [name] was placed on furosemide for diuresis.  Nephrology was consulted due to significant reduced GFR.   Assessment and Plan: * Acute on chronic diastolic CHF (congestive heart failure) (HCC) Echocardiogram with preserved LV systolic function EF 60 to 65%, mild asymmetric left ventricular hypertrophy of the basal septal segment. RV systolic function is preserved. RVSP 49.5 mmHg. LA with moderate dilatation, no significant valvular disease.   Urine output is 2,650 cc Systolic blood  pressure 165 to 164 mmHg.   Transitioned to torsemide 60 mg bid.  After load reduction with hydralazine and isosorbide.  B blocker with Nebivolol 5 mg daily.  Blood pressure control with amlodipine.   CKD (chronic kidney disease) stage 4, GFR 15-29 ml/min (HCC) AKI. Hyponatremia  Volume status is improving.  Renal function today with serum cr at 4.14 with K at 4,1 and serum bicarbonate at 19. Na 136 Mg 1,7   Transitioned to torsemide 60 mg po bid Add mag oxide po 400 mg po bid for 4 doses.  Follow up renal function in am.   Anemia of chronic disease with iron deficiency.  SP IV iron and continue with EPO  Essential hypertension Continue blood pressure control with hydralazine and isosorbide.  Added amlodipine for better blood pressure control.   Type 2 diabetes mellitus with hyperlipidemia (HCC) Continue glucose cover and monitoring with insulin sliding scale.   Continue statin therapy.   CAD in native artery Continue atorvastatin.   COPD (chronic obstructive pulmonary disease) (HCC) No signs of acute exacerbation.        Subjective: Carol [name] is feeling better but not back to baseline, continue to report dyspnea on exertion   Physical Exam: Vitals:   03/28/23 0340 03/28/23 0609 03/28/23 0741 03/28/23 0834  BP: (!) 178/77   (!) 165/82  Pulse: 73   77  Resp: 18     Temp: 97.8 F (36.6 C)   98.6 F (37 C)  TempSrc: Oral   Oral  SpO2: 98%  98% 98%  Weight:  95.8 kg    Height:  Neurology awake and alert ENT with mild pallor Cardiovascular with S1 and S2 present and regular with no gallops, rubs or murmurs Respiratory with no mild rales with no wheezing or rhonchi No JVD Positive lower extremity edema +, unna boots in place Abdomen with no distention  Data Reviewed:    Family Communication: I spoke with Carol [name]'s husband at the bedside, we talked in detail about Carol [name]'s condition, plan of care and prognosis and all questions were  addressed.   Disposition: Status is: Inpatient Remains inpatient appropriate because: possible discharge tomorrow if renal function stable   Planned Discharge Destination: Home    Author: Coralie Keens, MD 03/28/2023 11:57 AM  For on call review [email or web address].

## 2023-03-29 ENCOUNTER — Other Ambulatory Visit (HOSPITAL_COMMUNITY): Payer: Self-pay

## 2023-03-29 DIAGNOSIS — E1169 Type 2 diabetes mellitus with other specified complication: Secondary | ICD-10-CM | POA: Diagnosis not present

## 2023-03-29 DIAGNOSIS — N184 Chronic kidney disease, stage 4 (severe): Secondary | ICD-10-CM | POA: Diagnosis not present

## 2023-03-29 DIAGNOSIS — I5033 Acute on chronic diastolic (congestive) heart failure: Secondary | ICD-10-CM | POA: Diagnosis not present

## 2023-03-29 DIAGNOSIS — I10 Essential (primary) hypertension: Secondary | ICD-10-CM | POA: Diagnosis not present

## 2023-03-29 LAB — GLUCOSE, CAPILLARY
Glucose-Capillary: 124 mg/dL — ABNORMAL HIGH (ref 70–99)
Glucose-Capillary: 114 mg/dL — ABNORMAL HIGH (ref 70–99)

## 2023-03-29 MED ORDER — OXYCODONE HCL 5 MG PO TABS
5.0000 mg | ORAL_TABLET | Freq: Four times a day (QID) | ORAL | 0 refills | Status: DC | PRN
  Filled 2023-03-29: qty 10, 3d supply, fill #0

## 2023-03-29 MED ORDER — TORSEMIDE 20 MG PO TABS
60.0000 mg | ORAL_TABLET | Freq: Two times a day (BID) | ORAL | 0 refills | Status: DC
  Filled 2023-03-29: qty 180, 30d supply, fill #0

## 2023-03-29 MED ORDER — GABAPENTIN 100 MG PO CAPS
100.0000 mg | ORAL_CAPSULE | Freq: Three times a day (TID) | ORAL | 0 refills | Status: DC
  Filled 2023-03-29: qty 90, 30d supply, fill #0

## 2023-03-29 MED ORDER — POTASSIUM CHLORIDE CRYS ER 20 MEQ PO TBCR
20.0000 meq | EXTENDED_RELEASE_TABLET | Freq: Every day | ORAL | 2 refills | Status: DC
  Filled 2023-03-29: qty 30, 30d supply, fill #0

## 2023-03-29 NOTE — Care Management Important Message (Signed)
Important Message  Patient Details  Name: Carol [name] MRN: [id number] Date of Birth: [birth year]   Medicare Important Message Given:  Yes     Renie Ora 03/29/2023, 10:56 AM

## 2023-03-29 NOTE — Discharge Summary (Signed)
Physician Discharge Summary   Patient: Carol [name] MRN: [id number] DOB: [birth year]  Admit date:     03/22/2023  Discharge date: 03/29/23  Discharge Physician: York Ram    PCP: Gean Birchwood, [facility]   Recommendations at discharge:    Patient has been placed on torsemide 60 mg po bid. K supplementation 20 meq per day.  Follow up renal function and electrolytes in 7 days as outpatient.  Continue lower extremities compression socks.  Follow up with [facility] in 7 to 10 days.  Follow up Nephrology as scheduled.   Discharge Diagnoses: Principal Problem:   Acute on chronic diastolic CHF (congestive heart failure) (HCC) Active Problems:   CKD (chronic kidney disease) stage 4, GFR 15-29 ml/min (HCC)   Essential hypertension   Type 2 diabetes mellitus with hyperlipidemia (HCC)   CAD in native artery   COPD (chronic obstructive pulmonary disease) (HCC)  Resolved Problems:   * No resolved hospital problems. [facility] Course: Mrs. Vollmer was admitted to the hospital with the working diagnosis of heart failure decompensation in the setting of CKD stage 4.   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 She admitted using cocaine the night prior to admission. On the day of admission she called EMS, she was found with 02 saturation 80's, and was transported to the ED. On her initial physical examination her blood pressure was 172/84, RR 25, 02 saturation 100% on Venti mask.  Lungs with mild diffuse bilateral wheezing with no rhonchi, poor inspiratory effort, heart with S1 and S2 present and regular with no gallops, abdomen with no distention and positive lower extremity edema.   Na 135, K 3,8 Cl 107 bicarbonate 17, glucose 92, bun 37 cr 4,0  BNP  2,437  High sensitive troponin 17 and 17  Wbc 7,1 hgb 9,2 plt 247   Chest radiograph with right rotation, mild cardiomegaly, bilateral hilar vascular congestion, with small bilateral pleural effusions.   EKG 71 bpm, normal axis, qtc 520, sinus rhythm with no significant ST segment or T wave changes.   Patient was placed on furosemide for diuresis.  Nephrology was consulted due to significant reduced GFR.  08/09 her volume status has improved, she has been transitioned to oral torsemide. Will need close follow up as outpatient.   Assessment and Plan: * Acute on chronic diastolic CHF (congestive heart failure) (HCC) Echocardiogram with preserved LV systolic function EF 60 to 65%, mild asymmetric left ventricular hypertrophy of the basal septal segment. RV systolic function is preserved. RVSP 49.5 mmHg. LA with moderate dilatation, no significant valvular disease.   Patient was placed on furosemide IV for diuresis, negative fluid balance was achieved, -  11,965 ml, with significant improvement in her symptoms.   Patient has been transitioned to torsemide, 60 mg po bid.  After load reduction with hydralazine and isosorbide.  B blocker with Nebivolol 5 mg daily.  Blood pressure control with amlodipine.   Patient will need life style modifications and close follow up as outpatient.  CKD (chronic kidney disease) stage 4, GFR 15-29 ml/min (HCC) AKI. Hyponatremia  Her volume status has improved.  Renal function at the time of her discharge with serum cr at 4.2 with K at 4,2 and serum bicarbonate at 16. BUN 56 and Na 134.  P 3.5   Transitioned to  torsemide 60 mg po bid  Anemia of chronic disease with iron deficiency.  SP IV iron and continue with EPO  Follow up renal function and electrolytes as outpatient. Follow up with nephrology as outpatient.   Essential hypertension Continue blood pressure control with amlodipine, hydralazine and isosorbide.     Type 2 diabetes mellitus with  hyperlipidemia (HCC) Patient was placed on insulin therapy sliding scale during her hospitalization. Her glucose remained stable.  Continue with linagliptin.   Continue statin therapy.   CAD in native artery Continue atorvastatin.   COPD (chronic obstructive pulmonary disease) (HCC) No signs of acute exacerbation.         Consultants: nephrology  Procedures performed: none   Disposition: Home Diet recommendation:  Cardiac and Carb modified diet DISCHARGE MEDICATION: Allergies as of 03/29/2023       Reactions   Penicillins Shortness Of Breath, Other (See Comments)   Caused yeast infection Has patient had a PCN reaction causing immediate rash, facial/tongue/throat swelling, SOB or lightheadedness with hypotension: Yes Has patient had a PCN reaction causing severe rash involving mucus membranes or skin necrosis: No Has patient had a PCN reaction that required hospitalization pt was in the hospital at the time of the reaction Has patient had a PCN reaction occurring within the last 10 years: No If all of the above answers are "NO", then may proceed with Cephalosp   Ultram [tramadol] Other (See Comments)   Made her tongue raw        Medication List     STOP taking these medications    buprenorphine-naloxone 2-0.5 mg Subl SL tablet Commonly known as: SUBOXONE   furosemide 40 MG tablet Commonly known as: LASIX       TAKE these medications    albuterol 108 (90 Base) MCG/ACT inhaler Commonly known as: VENTOLIN HFA Inhale 2 puffs into the lungs every 4 (four) hours as needed for wheezing or shortness of breath.   amLODipine 10 MG tablet Commonly known as: NORVASC Take 1 tablet (10 mg total) by mouth daily.   Aspirin Low Dose 81 MG tablet Generic drug: aspirin EC Take 1 tablet (81 mg total) by mouth daily. Swallow whole.   atorvastatin 80 MG tablet Commonly known as: LIPITOR Take 1 tablet (80 mg total) by mouth daily.   budesonide-formoterol 160-4.5 MCG/ACT  inhaler Commonly known as: SYMBICORT Inhale 2 puffs into the lungs every morning.   cetirizine 10 MG tablet Commonly known as: ZYRTEC Take 10 mg by mouth daily as needed for allergies.   ergocalciferol 1.25 MG (50000 UT) capsule Commonly known as: VITAMIN D2 Take 1 capsule (50,000 Units total) by mouth once a week. Wednesday   Eszopiclone 3 MG Tabs Take 3 mg by mouth at bedtime. Take immediately before bedtime   gabapentin 100 MG capsule Commonly known as: NEURONTIN Take 1 capsule (100 mg total) by mouth 3 (three) times daily. What changed: when to take this   hydrALAZINE 50 MG tablet Commonly known as: APRESOLINE Take 1 tablet (50 mg total) by mouth 3 (three) times daily.   isosorbide mononitrate 30 MG 24 hr tablet Commonly known as: IMDUR Take 0.5 tablets (15 mg total) by mouth daily.   melatonin 5 MG Tabs Take 1 tablet (5 mg total) by mouth at bedtime as needed (sleep).   mirtazapine 7.5 MG tablet Commonly known as: REMERON Take 1 tablet (7.5 mg total) by mouth at bedtime.   nebivolol 10 MG tablet Commonly known as: BYSTOLIC Take 1 tablet (10 mg  total) by mouth daily.   nitroGLYCERIN 0.4 MG SL tablet Commonly known as: NITROSTAT Place 1 tablet (0.4 mg total) under the tongue every 5 (five) minutes as needed for chest pain.   oxyCODONE 5 MG immediate release tablet Commonly known as: Oxy IR/ROXICODONE Take 1 tablet (5 mg total) by mouth every 6 (six) hours as needed for severe pain.   pantoprazole 40 MG tablet Commonly known as: PROTONIX Take 1 tablet (40 mg total) by mouth daily at 12 noon.   potassium chloride SA 20 MEQ tablet Commonly known as: KLOR-CON M Take 1 tablet (20 mEq total) by mouth daily.   torsemide 20 MG tablet Commonly known as: DEMADEX Take 3 tablets (60 mg total) by mouth 2 (two) times daily.   Tradjenta 5 MG Tabs tablet Generic drug: linagliptin Take 1 tablet (5 mg total) by mouth daily.               Durable Medical  Equipment  (From admission, onward)           Start     Ordered   03/25/23 1648  For home use only DME Walker rolling  Once       Question Answer Comment  Walker: With 5 Inch Wheels   Patient needs a walker to treat with the following condition Weakness      03/25/23 1647   03/25/23 1648  For home use only DME Tub bench  Once        03/25/23 1647            Follow-up Information     [facility] Follow up.   Specialty: Internal Medicine Contact information: [address] [place] [address] [phone]         [facility] Follow up.   Specialty: Home Health Services Why: Agency will call you to set up apt times Contact information: [address] [place] [address] [phone]                Discharge Exam: Filed Weights   03/27/23 0439 03/28/23 0609 03/29/23 0449  Weight: 96.8 kg 95.8 kg 91.6 kg   BP (!) 153/91 (BP Location: Left Arm)   Pulse 72   Temp 98.6 F (37 C) (Oral)   Resp 18   Ht 5\' 4"  (1.626 m)   Wt 91.6 kg   SpO2 98%   BMI 34.66 kg/m   Patient with no chest pain or dyspnea, edema has improved.   Neurology awake and alert ENT with mild pallor Cardiovascular with S1 and S2 present and regular with no gallops, rubs or murmurs Respiratory with no rales or wheezing, no rhonchi Abdomen with no distention  Trace lower extremity edema   Condition at discharge: stable  The results of significant diagnostics from this hospitalization (including imaging, microbiology, ancillary and laboratory) are listed below for reference.   Imaging Studies: DG Chest Port 1 View  Result Date: 03/22/2023 CLINICAL DATA:  Shortness of breath and chest pain. EXAM: PORTABLE CHEST 1 VIEW COMPARISON:  03/07/2023 FINDINGS: Stable cardiac enlargement. Development worsening diffuse pulmonary interstitial edema/CHF. There may be small bilateral pleural effusions. No pneumothorax. IMPRESSION: Worsening diffuse pulmonary  interstitial edema/CHF. Electronically Signed   By: Irish Lack M.D.   On: 03/22/2023 12:56   ECHOCARDIOGRAM COMPLETE  Result Date: 03/08/2023    ECHOCARDIOGRAM REPORT   Patient Name:   GEMA ACOMB Date of Exam: 03/08/2023 Medical Rec #:  [id number]  Height:       64.0 in Accession #:    [id number]              Weight:       182.1 lb Date of Birth:  [birth year]               BSA:          1.880 m Patient Age:    62 years                BP:           189/91 mmHg Patient Gender: F                       HR:           68 bpm. Exam Location:  Inpatient Procedure: 2D Echo, Cardiac Doppler and Color Doppler Indications:    CHF- Acute Diastolic  History:        Patient has prior history of Echocardiogram examinations, most                 recent 12/20/2021. CHF, NSTEMI; Risk Factors:Diabetes and                 Hypertension.  Sonographer:    Raeford Razor Referring Phys: [phone] JARED M GARDNER  Sonographer Comments: Technically difficult study due to poor echo windows. IMPRESSIONS  1. Left ventricular ejection fraction, by estimation, is 60 to 65%. The left ventricle has normal function. The left ventricle has no regional wall motion abnormalities. There is mild asymmetric left ventricular hypertrophy of the basal-septal segment. Left ventricular diastolic parameters are consistent with Grade II diastolic dysfunction (pseudonormalization).  2. Right ventricular systolic function is normal. The right ventricular size is normal. There is moderately elevated pulmonary artery systolic pressure. The estimated right ventricular systolic pressure is 49.5 mmHg.  3. Left atrial size was moderately dilated.  4. The mitral valve is normal in structure. Mild mitral valve regurgitation. No evidence of mitral stenosis.  5. The aortic valve is tricuspid. There is mild calcification of the aortic valve. Aortic valve regurgitation is trivial. Aortic valve sclerosis/calcification is present, without any evidence of  aortic stenosis.  6. The inferior vena cava is normal in size with <50% respiratory variability, suggesting right atrial pressure of 8 mmHg. FINDINGS  Left Ventricle: Left ventricular ejection fraction, by estimation, is 60 to 65%. The left ventricle has normal function. The left ventricle has no regional wall motion abnormalities. The left ventricular internal cavity size was normal in size. There is  mild asymmetric left ventricular hypertrophy of the basal-septal segment. Left ventricular diastolic parameters are consistent with Grade II diastolic dysfunction (pseudonormalization). Right Ventricle: The right ventricular size is normal. No increase in right ventricular wall thickness. Right ventricular systolic function is normal. There is moderately elevated pulmonary artery systolic pressure. The tricuspid regurgitant velocity is 3.22 m/s, and with an assumed right atrial pressure of 8 mmHg, the estimated right ventricular systolic pressure is 49.5 mmHg. Left Atrium: Left atrial size was moderately dilated. Right Atrium: Right atrial size was normal in size. Pericardium: There is no evidence of pericardial effusion. Mitral Valve: The mitral valve is normal in structure. Mild mitral valve regurgitation. No evidence of mitral valve stenosis. Tricuspid Valve: The tricuspid valve is normal in structure. Tricuspid valve regurgitation is mild . No evidence of tricuspid stenosis. Aortic Valve: The aortic valve is tricuspid. There is mild calcification of the aortic valve. Aortic  valve regurgitation is trivial. Aortic valve sclerosis/calcification is present, without any evidence of aortic stenosis. Aortic valve peak gradient measures 4.4 mmHg. Pulmonic Valve: The pulmonic valve was not well visualized. Pulmonic valve regurgitation is not visualized. No evidence of pulmonic stenosis. Aorta: The aortic root is normal in size and structure. Venous: The inferior vena cava is normal in size with less than 50% respiratory  variability, suggesting right atrial pressure of 8 mmHg. IAS/Shunts: No atrial level shunt detected by color flow Doppler.  LEFT VENTRICLE PLAX 2D LVIDd:         4.90 cm   Diastology LVIDs:         3.30 cm   LV e' medial:    4.57 cm/s LV PW:         1.00 cm   LV E/e' medial:  31.9 LV IVS:        1.00 cm   LV e' lateral:   7.40 cm/s LVOT diam:     1.70 cm   LV E/e' lateral: 19.7 LV SV:         39 LV SV Index:   21 LVOT Area:     2.27 cm  RIGHT VENTRICLE             IVC RV Basal diam:  3.00 cm     IVC diam: 1.80 cm RV S prime:     12.50 cm/s TAPSE (M-mode): 1.5 cm LEFT ATRIUM             Index        RIGHT ATRIUM           Index LA diam:        4.20 cm 2.23 cm/m   RA Area:     11.80 cm LA Vol (A2C):   69.5 ml 36.97 ml/m  RA Volume:   25.80 ml  13.72 ml/m LA Vol (A4C):   78.0 ml 41.49 ml/m LA Biplane Vol: 78.0 ml 41.49 ml/m  AORTIC VALVE AV Area (Vmax): 1.71 cm AV Vmax:        105.00 cm/s AV Peak Grad:   4.4 mmHg LVOT Vmax:      79.10 cm/s LVOT Vmean:     50.900 cm/s LVOT VTI:       0.172 m  AORTA Ao Root diam: 3.20 cm MITRAL VALVE                TRICUSPID VALVE MV Area (PHT): 3.99 cm     TR Peak grad:   41.5 mmHg MV Decel Time: 190 msec     TR Vmax:        322.00 cm/s MR Peak grad: 85.7 mmHg MR Vmax:      463.00 cm/s   SHUNTS MV E velocity: 146.00 cm/s  Systemic VTI:  0.17 m MV A velocity: 67.00 cm/s   Systemic Diam: 1.70 cm MV E/A ratio:  2.18 Arvilla Meres MD Electronically signed by Arvilla Meres MD Signature Date/Time: 03/08/2023/9:03:40 AM    Final    DG Chest Port 1 View  Result Date: 03/07/2023 CLINICAL DATA:  Fluid overload EXAM: PORTABLE CHEST 1 VIEW COMPARISON:  07/21/2022 FINDINGS: The lungs are symmetrically well inflated. There is central pulmonary vascular congestion with pulmonary vascular redistribution of the lung apices in keeping with changes of early cardiogenic failure or volume overload. Bibasilar somewhat asymmetric peribronchial infiltrates have developed which may reflect  changes of edema or airway inflammation. No pneumothorax or pleural effusion. Stable cardiomegaly. No acute bone abnormality.  IMPRESSION: 1. Cardiomegaly with early cardiogenic failure or volume overload. 2. Bibasilar peribronchial infiltrates which may reflect changes of edema or airway inflammation. Electronically Signed   By: Helyn Numbers M.D.   On: 03/07/2023 19:40    Microbiology: Results for orders placed or performed during the hospital encounter of 01/10/22  Resp Panel by RT-PCR (Flu A&B, Covid) Nasopharyngeal Swab     Status: None   Collection Time: 01/10/22  9:49 AM   Specimen: Nasopharyngeal Swab; Nasopharyngeal(NP) swabs in vial transport medium  Result Value Ref Range Status   SARS Coronavirus 2 by RT PCR NEGATIVE NEGATIVE Final    Comment: (NOTE) SARS-CoV-2 target nucleic acids are NOT DETECTED.  The SARS-CoV-2 RNA is generally detectable in upper respiratory specimens during the acute phase of infection. The lowest concentration of SARS-CoV-2 viral copies this assay can detect is 138 copies/mL. A negative result does not preclude SARS-Cov-2 infection and should not be used as the sole basis for treatment or other patient management decisions. A negative result may occur with  improper specimen collection/handling, submission of specimen other than nasopharyngeal swab, presence of viral mutation(s) within the areas targeted by this assay, and inadequate number of viral copies(<138 copies/mL). A negative result must be combined with clinical observations, patient history, and epidemiological information. The expected result is Negative.  Fact Sheet for Patients:  BloggerCourse.com  Fact Sheet for Healthcare Providers:  SeriousBroker.it  This test is no t yet approved or cleared by the [place] FDA and  has been authorized for detection and/or diagnosis of SARS-CoV-2 by FDA under an Emergency Use Authorization (EUA).  This EUA will remain  in effect (meaning this test can be used) for the duration of the COVID-19 declaration under Section 564(b)(1) of the Act, 21 U.S.C.section 360bbb-3(b)(1), unless the authorization is terminated  or revoked sooner.       Influenza A by PCR NEGATIVE NEGATIVE Final   Influenza B by PCR NEGATIVE NEGATIVE Final    Comment: (NOTE) The Xpert Xpress SARS-CoV-2/FLU/RSV plus assay is intended as an aid in the diagnosis of influenza from Nasopharyngeal swab specimens and should not be used as a sole basis for treatment. Nasal washings and aspirates are unacceptable for Xpert Xpress SARS-CoV-2/FLU/RSV testing.  Fact Sheet for Patients: BloggerCourse.com  Fact Sheet for Healthcare Providers: SeriousBroker.it  This test is not yet approved or cleared by the [place] FDA and has been authorized for detection and/or diagnosis of SARS-CoV-2 by FDA under an Emergency Use Authorization (EUA). This EUA will remain in effect (meaning this test can be used) for the duration of the COVID-19 declaration under Section 564(b)(1) of the Act, 21 U.S.C. section 360bbb-3(b)(1), unless the authorization is terminated or revoked.  Performed at [facility], [address], [place], [place] [address]     Labs: CBC: Recent Labs  Lab 03/22/23 1154 03/22/23 1209 03/23/23 0105 03/24/23 0115 03/25/23 0142 03/26/23 0025 03/27/23 0018  WBC 7.1  --  3.8* 7.6 9.3 8.3 8.9  NEUTROABS 5.0  --   --   --   --   --   --   HGB 9.2*   < > 8.4* 7.6* 7.8* 7.6* 7.8*  HCT 28.9*   < > 25.5* 23.2* 22.8* 23.3* 23.2*  MCV 89.2  --  86.1 86.2 86.7 85.0 85.3  PLT 247  --  220 210 205 191 199   < > = values in this interval not displayed.   Basic Metabolic Panel: Recent Labs  Lab  03/23/23 0105 03/23/23 1216 03/25/23 0142 03/26/23 0025 03/27/23 0018 03/28/23 0600 03/29/23 0206  NA 132*   < > 134* 134* 134* 136 134*  K 4.0   < >  3.6 3.7 3.7 4.1 4.2  CL 105   < > 105 105 103 103 102  CO2 16*   < > 18* 17* 18* 19* 16*  GLUCOSE 199*   < > 106* 124* 139* 121* 162*  BUN 45*   < > 54* 57* 57* 54* 56*  CREATININE 4.22*   < > 4.23* 4.27* 4.10* 4.14* 4.22*  CALCIUM 8.3*   < > 9.0 9.2 9.4 9.4 8.9  MG 1.7  --   --   --   --  1.7  --   PHOS 4.6  --   --   --   --   --  3.5   < > = values in this interval not displayed.   Liver Function Tests: Recent Labs  Lab 03/22/23 1154 03/23/23 0105 03/29/23 0206  AST 20  --   --   ALT 13  --   --   ALKPHOS 145*  --   --   BILITOT 0.8  --   --   PROT 6.4*  --   --   ALBUMIN 2.4* 2.2* 3.2*   CBG: Recent Labs  Lab 03/28/23 0619 03/28/23 1129 03/28/23 1711 03/28/23 2100 03/29/23 0617  GLUCAP 119* 173* 125* 114* 124*    Discharge time spent: greater than 30 minutes.  Signed: Coralie Keens, MD [facility] 03/29/2023

## 2023-03-29 NOTE — Plan of Care (Signed)

## 2023-03-29 NOTE — Progress Notes (Signed)
Hi this pt discharged with instruction given, IV Tele already removed by Assigned nurse, Marla Roe Rn helped pick meds and pushed patient out to catch a Taxi with Fiance.

## 2023-03-29 NOTE — Plan of Care (Signed)
  Problem: Activity: Goal: Risk for activity intolerance will decrease Outcome: Progressing   Problem: Nutrition: Goal: Adequate nutrition will be maintained Outcome: Progressing   Problem: Coping: Goal: Level of anxiety will decrease Outcome: Progressing   Problem: Activity: Goal: Risk for activity intolerance will decrease Outcome: Progressing

## 2023-03-29 NOTE — Progress Notes (Signed)
Unna boots removed. Compressions placed at this time.

## 2023-03-29 NOTE — Progress Notes (Signed)
[facility] Progress Note   Assessment/ Plan:   CKD IV: appears progressive, Cr was in the mid-3s in March and is now up to 4.  Has nephrotic range proteinuria             - doubt active GN and UA has inactive sediment             - suspect HTN, DM, polysubstance abuse                          - SPEP and free light chains- negative             - Lasix IV 80 TID,  previous albumin has been completed             - may be better to d/c on torsemide -  will change her over today to torsemide 60 bid                          - no need for RRT at present but likely will approach this in her lifetime--> mother was on dialysis-  is not seeing nephrology -  will need to at discharge-  stable to be discharged on torsemide 60 BID-  will add a little potassium also 20 daily as she has req some here.  I will arrange follow up labs and appt at [facility]    2.  Acute on chronic CHF exacerbation             - TTE 02/2023 with EF 6065% and G2DD             - Lasix as above-   IV for a while-  change to torsemide today    3.  HTN:             - pretty hypertensive             -  put back on her bystolic at half dose to allow [place] to diurese  - hydralazine also appears to have been added   -is reasonable for now-  should cont to go down with continued diuresis  4.  Anemia:  - iron deficient, getting feraheme-  will need second dose this week.  Also on esa      Subjective:    Seen in room.  At least  2.8 liters of urine on torsemide-  numbers basically stable. . Still hypertensive but better -  not uremic - weight trending down nicely -  is 201 today  ( not sure is accurate)-  plan is to send home on oral lasix   Objective:   BP (!) 153/91 (BP Location: Left Arm)   Pulse 72   Temp 98.6 F (37 C) (Oral)   Resp 18   Ht 5\' 4"  (1.626 m)   Wt 91.6 kg   SpO2 98%   BMI 34.66 kg/m   Intake/Output Summary (Last 24 hours) at 03/29/2023 0904 Last data filed at 03/29/2023 0834 Gross per 24 hour   Intake --  Output 3650 ml  Net -3650 ml   Weight change: -4.2 kg  Physical Exam: GEN NAD, sitting up in bed HEENT EOMI PERRL NECK no overt JVD PULM clear, not on O2 CV RRR ABD soft EXT 1+ LE edema with unna boots in place, also hip and abdominal wall edema  NEURO AAO x 3  SKIN no rashes or lesions  Imaging: No  results found.  Labs: BMET Recent Labs  Lab 03/23/23 0105 03/23/23 1216 03/24/23 0804 03/25/23 0142 03/26/23 0025 03/27/23 0018 03/28/23 0600 03/29/23 0206  NA 132* 133* 135 134* 134* 134* 136 134*  K 4.0 4.0 3.9 3.6 3.7 3.7 4.1 4.2  CL 105 102 105 105 105 103 103 102  CO2 16* 15* 19* 18* 17* 18* 19* 16*  GLUCOSE 199* 214* 109* 106* 124* 139* 121* 162*  BUN 45* 48* 53* 54* 57* 57* 54* 56*  CREATININE 4.22* 4.25* 4.32* 4.23* 4.27* 4.10* 4.14* 4.22*  CALCIUM 8.3* 8.6* 8.8* 9.0 9.2 9.4 9.4 8.9  PHOS 4.6  --   --   --   --   --   --  3.5   CBC Recent Labs  Lab 03/22/23 1154 03/22/23 1209 03/24/23 0115 03/25/23 0142 03/26/23 0025 03/27/23 0018  WBC 7.1   < > 7.6 9.3 8.3 8.9  NEUTROABS 5.0  --   --   --   --   --   HGB 9.2*   < > 7.6* 7.8* 7.6* 7.8*  HCT 28.9*   < > 23.2* 22.8* 23.3* 23.2*  MCV 89.2   < > 86.2 86.7 85.0 85.3  PLT 247   < > 210 205 191 199   < > = values in this interval not displayed.    Medications:     amLODipine  10 mg Oral Daily   atorvastatin  80 mg Oral Daily   darbepoetin (ARANESP) injection - NON-DIALYSIS  200 mcg Subcutaneous Q Mon-1800   gabapentin  100 mg Oral TID   heparin  5,000 Units Subcutaneous Q8H   hydrALAZINE  50 mg Oral TID   insulin aspart  0-15 Units Subcutaneous TID WC   ipratropium-albuterol  3 mL Nebulization BID   isosorbide mononitrate  30 mg Oral Daily   magnesium oxide  400 mg Oral BID   mometasone-formoterol  2 puff Inhalation BID   nebivolol  5 mg Oral Daily   pantoprazole  40 mg Oral Daily   torsemide  60 mg Oral BID     A   03/29/2023, 9:04 AM

## 2023-03-29 NOTE — TOC Transition Note (Addendum)
Transition of Care [facility]) - CM/SW Discharge Note   Patient Details  Name: Carol [name] MRN: [id number] Date of Birth: 1961-05-12  Transition of Care [facility] CM/SW Contact:  Leone Haven, RN Phone Number: 03/29/2023, 10:32 AM   Clinical Narrative:    Patient is for dc today, NCM notified Kandee Keen with [facility] of dc today.  Patient's fiance will pay for her a cab to get home today.  [employer] has delivered DME to her room.  Patient now wants a BSC , NCM asked rep with [employer] to deliver this to her room.   She will have ted stockings per MD.  TOC to fill meds.  NCM attached SA resource to AVS.     Barriers to Discharge: Continued Medical Work up   Patient Goals and CMS Choice CMS Medicare.gov Compare Post Acute Care list provided to:: Patient Choice offered to / list presented to : Patient  Discharge Placement                         Discharge Plan and Services Additional resources added to the After Visit Summary for   In-house Referral: NA Discharge Planning Services: CM Consult Post Acute Care Choice: Home Health          DME Arranged: Tub bench, Walker rolling DME Agency: [employer] Date DME Agency Contacted: 03/25/23 Time DME Agency Contacted: 1717 Representative spoke with at DME Agency: Vaughan Basta HH Arranged: PT HH Agency: [facility] Date [facility] Agency Contacted: 03/25/23 Time HH Agency Contacted: 1717 Representative spoke with at [facility] Agency: Kandee Keen  Social Determinants of Health (SDOH) Interventions SDOH Screenings   Food Insecurity: No Food Insecurity (03/22/2023)  Housing: Low Risk  (03/22/2023)  Transportation Needs: No Transportation Needs (03/22/2023)  Utilities: Not At Risk (03/22/2023)  Financial Resource Strain: Low Risk  (04/04/2022)  Tobacco Use: High Risk (03/22/2023)     Readmission Risk Interventions    03/25/2023    4:51 PM 03/27/2022   10:36 AM  Readmission Risk Prevention Plan  Transportation Screening Complete Complete   Medication Review [employer]) Complete Complete  PCP or Specialist appointment within 3-5 days of discharge  Complete  HRI or Home Care Consult Complete Complete  SW Recovery Care/Counseling Consult  Complete  Palliative Care Screening Not Applicable Not Applicable  Skilled Nursing Facility Not Applicable Not Applicable

## 2023-03-29 NOTE — Progress Notes (Signed)
Received call from central monitoring stating that patient was off telemetry. Upon entering room, patient dressed in regular clothes. Pure wick and telemetry removed by patient. Patient and significant other at bedside stated that provider stated she will be discharged. Patient informed nurse will be back in once provider placed discharge order to go over paper. They verbalized understanding.

## 2023-04-02 ENCOUNTER — Other Ambulatory Visit (HOSPITAL_COMMUNITY): Payer: Self-pay | Admitting: Family Medicine

## 2023-04-02 ENCOUNTER — Other Ambulatory Visit: Payer: Self-pay | Admitting: Student

## 2023-04-03 ENCOUNTER — Other Ambulatory Visit: Payer: Self-pay | Admitting: Internal Medicine

## 2023-04-03 ENCOUNTER — Encounter (HOSPITAL_COMMUNITY): Payer: 59

## 2023-04-04 LAB — BASIC METABOLIC PANEL WITH GFR
BUN/Creatinine Ratio: 11 (calc) (ref 6–22)
BUN: 53 mg/dL — ABNORMAL HIGH (ref 7–25)
CO2: 21 mmol/L (ref 20–32)
Calcium: 9.2 mg/dL (ref 8.6–10.4)
Chloride: 106 mmol/L (ref 98–110)
Creat: 4.87 mg/dL — ABNORMAL HIGH (ref 0.50–1.05)
Glucose, Bld: 117 mg/dL — ABNORMAL HIGH (ref 65–99)
Potassium: 4.8 mmol/L (ref 3.5–5.3)
Sodium: 140 mmol/L (ref 135–146)
eGFR: 10 mL/min/{1.73_m2} — ABNORMAL LOW (ref >=60)

## 2023-04-04 LAB — CBC
HCT: 28.3 % — ABNORMAL LOW (ref 35.0–45.0)
Hemoglobin: 8.9 g/dL — ABNORMAL LOW (ref 11.7–15.5)
MCH: 28.4 pg (ref 27.0–33.0)
MCHC: 31.4 g/dL — ABNORMAL LOW (ref 32.0–36.0)
MCV: 90.4 fL (ref 80.0–100.0)
MPV: 12.2 fL (ref 7.5–12.5)
Platelets: 212 Thousand/uL (ref 140–400)
RBC: 3.13 Million/uL — ABNORMAL LOW (ref 3.80–5.10)
RDW: 13.3 % (ref 11.0–15.0)
WBC: 6.4 Thousand/uL (ref 3.8–10.8)

## 2023-04-29 ENCOUNTER — Telehealth (HOSPITAL_COMMUNITY): Payer: Self-pay

## 2023-04-29 NOTE — Telephone Encounter (Signed)
Called to confirm/remind patient of their appointment at [facility] on 04/30/23.

## 2023-04-30 ENCOUNTER — Encounter (HOSPITAL_COMMUNITY): Payer: 59

## 2023-06-09 IMAGING — DX DG CHEST 1V PORT
1 series · 1 of 1 positions shown · non-contrast
Comparison: 11/21/2021

CLINICAL DATA: STEMI and chest pain

EXAM:
PORTABLE CHEST 1 VIEW

[chest]
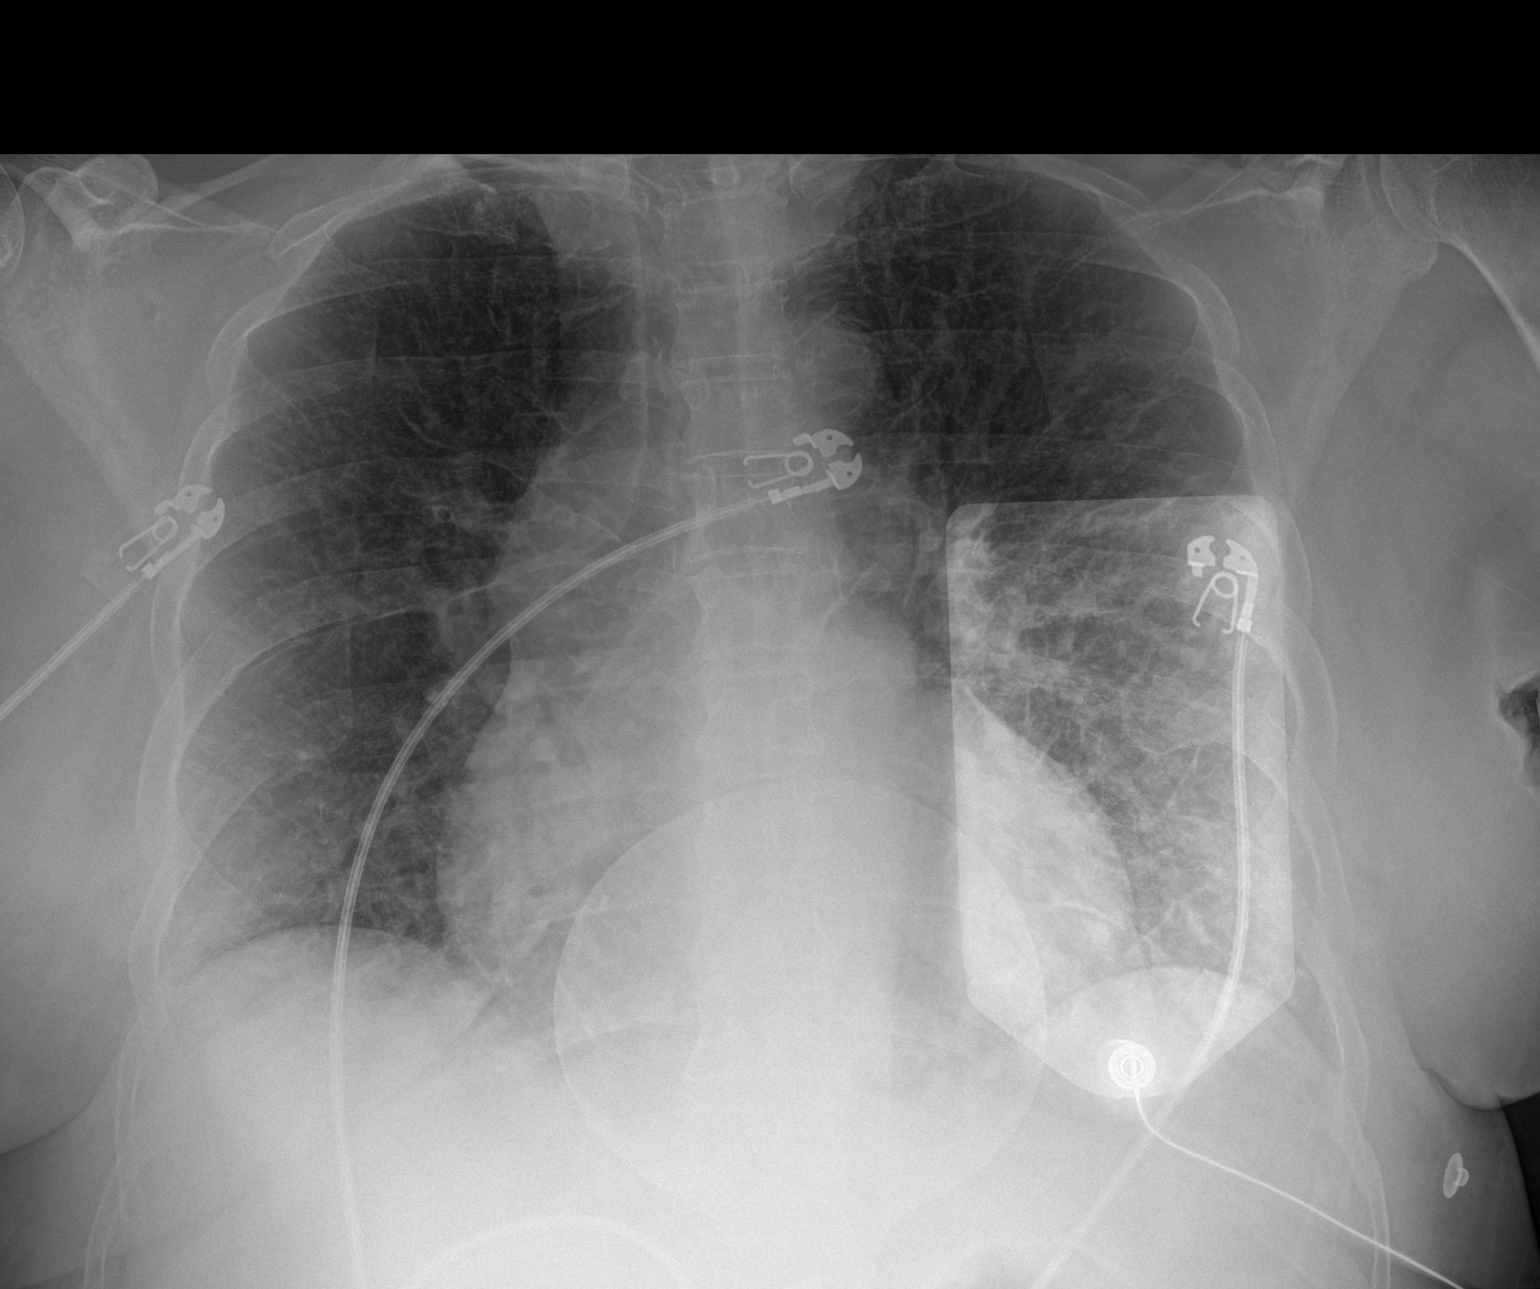

[1 of 1 positions shown; findings below may reference images not displayed]

FINDINGS: Mild cardiomegaly. Stable mediastinal contours including the right
mediastinal convexity at the ascending aorta. Diffuse interstitial
coarsening above prior baseline. Artifact from EKG leads and
defibrillator pads.
IMPRESSION: Mild cardiomegaly with interstitial edema.

## 2023-06-24 ENCOUNTER — Other Ambulatory Visit (HOSPITAL_COMMUNITY): Payer: Self-pay | Admitting: Cardiology

## 2023-07-22 ENCOUNTER — Other Ambulatory Visit: Payer: Self-pay | Admitting: Student

## 2023-07-30 ENCOUNTER — Inpatient Hospital Stay (HOSPITAL_COMMUNITY)
Admission: EM | Admit: 2023-07-30 | Discharge: 2023-08-05 | DRG: 264 | Disposition: A | Discharge status: Home / Self Care | Payer: 59 | Attending: Family Medicine | Admitting: Family Medicine

## 2023-07-30 ENCOUNTER — Emergency Department (HOSPITAL_COMMUNITY): Payer: 59

## 2023-07-30 ENCOUNTER — Encounter (HOSPITAL_COMMUNITY): Payer: Self-pay

## 2023-07-30 ENCOUNTER — Other Ambulatory Visit: Payer: Self-pay

## 2023-07-30 DIAGNOSIS — G20A1 Parkinson's disease without dyskinesia, without mention of fluctuations: Secondary | ICD-10-CM | POA: Diagnosis present

## 2023-07-30 DIAGNOSIS — R0902 Hypoxemia: Secondary | ICD-10-CM | POA: Diagnosis present

## 2023-07-30 DIAGNOSIS — E663 Overweight: Secondary | ICD-10-CM | POA: Insufficient documentation

## 2023-07-30 DIAGNOSIS — N179 Acute kidney failure, unspecified: Secondary | ICD-10-CM | POA: Diagnosis present

## 2023-07-30 DIAGNOSIS — Z6829 Body mass index (BMI) 29.0-29.9, adult: Secondary | ICD-10-CM

## 2023-07-30 DIAGNOSIS — J449 Chronic obstructive pulmonary disease, unspecified: Secondary | ICD-10-CM | POA: Diagnosis present

## 2023-07-30 DIAGNOSIS — E611 Iron deficiency: Secondary | ICD-10-CM | POA: Diagnosis present

## 2023-07-30 DIAGNOSIS — R9431 Abnormal electrocardiogram [ECG] [EKG]: Secondary | ICD-10-CM | POA: Diagnosis present

## 2023-07-30 DIAGNOSIS — Z823 Family history of stroke: Secondary | ICD-10-CM

## 2023-07-30 DIAGNOSIS — E1169 Type 2 diabetes mellitus with other specified complication: Secondary | ICD-10-CM | POA: Diagnosis present

## 2023-07-30 DIAGNOSIS — Z91199 Patient's noncompliance with other medical treatment and regimen due to unspecified reason: Secondary | ICD-10-CM

## 2023-07-30 DIAGNOSIS — N186 End stage renal disease: Secondary | ICD-10-CM

## 2023-07-30 DIAGNOSIS — G8929 Other chronic pain: Secondary | ICD-10-CM | POA: Diagnosis present

## 2023-07-30 DIAGNOSIS — E1122 Type 2 diabetes mellitus with diabetic chronic kidney disease: Secondary | ICD-10-CM | POA: Diagnosis present

## 2023-07-30 DIAGNOSIS — D631 Anemia in chronic kidney disease: Secondary | ICD-10-CM | POA: Diagnosis present

## 2023-07-30 DIAGNOSIS — I161 Hypertensive emergency: Principal | ICD-10-CM | POA: Diagnosis present

## 2023-07-30 DIAGNOSIS — Z992 Dependence on renal dialysis: Secondary | ICD-10-CM

## 2023-07-30 DIAGNOSIS — Z880 Allergy status to penicillin: Secondary | ICD-10-CM

## 2023-07-30 DIAGNOSIS — R7989 Other specified abnormal findings of blood chemistry: Secondary | ICD-10-CM | POA: Diagnosis present

## 2023-07-30 DIAGNOSIS — D638 Anemia in other chronic diseases classified elsewhere: Secondary | ICD-10-CM | POA: Diagnosis present

## 2023-07-30 DIAGNOSIS — Z7951 Long term (current) use of inhaled steroids: Secondary | ICD-10-CM

## 2023-07-30 DIAGNOSIS — Z79891 Long term (current) use of opiate analgesic: Secondary | ICD-10-CM

## 2023-07-30 DIAGNOSIS — K219 Gastro-esophageal reflux disease without esophagitis: Secondary | ICD-10-CM | POA: Diagnosis present

## 2023-07-30 DIAGNOSIS — Z79899 Other long term (current) drug therapy: Secondary | ICD-10-CM

## 2023-07-30 DIAGNOSIS — I252 Old myocardial infarction: Secondary | ICD-10-CM

## 2023-07-30 DIAGNOSIS — Z8249 Family history of ischemic heart disease and other diseases of the circulatory system: Secondary | ICD-10-CM

## 2023-07-30 DIAGNOSIS — Z825 Family history of asthma and other chronic lower respiratory diseases: Secondary | ICD-10-CM

## 2023-07-30 DIAGNOSIS — E8729 Other acidosis: Secondary | ICD-10-CM | POA: Diagnosis present

## 2023-07-30 DIAGNOSIS — Z841 Family history of disorders of kidney and ureter: Secondary | ICD-10-CM

## 2023-07-30 DIAGNOSIS — F17210 Nicotine dependence, cigarettes, uncomplicated: Secondary | ICD-10-CM | POA: Diagnosis present

## 2023-07-30 DIAGNOSIS — E119 Type 2 diabetes mellitus without complications: Secondary | ICD-10-CM | POA: Diagnosis present

## 2023-07-30 DIAGNOSIS — F419 Anxiety disorder, unspecified: Secondary | ICD-10-CM | POA: Diagnosis present

## 2023-07-30 DIAGNOSIS — I2489 Other forms of acute ischemic heart disease: Secondary | ICD-10-CM | POA: Diagnosis present

## 2023-07-30 DIAGNOSIS — Z7982 Long term (current) use of aspirin: Secondary | ICD-10-CM

## 2023-07-30 DIAGNOSIS — F1410 Cocaine abuse, uncomplicated: Secondary | ICD-10-CM | POA: Diagnosis present

## 2023-07-30 DIAGNOSIS — I5032 Chronic diastolic (congestive) heart failure: Secondary | ICD-10-CM | POA: Diagnosis present

## 2023-07-30 DIAGNOSIS — I132 Hypertensive heart and chronic kidney disease with heart failure and with stage 5 chronic kidney disease, or end stage renal disease: Principal | ICD-10-CM | POA: Diagnosis present

## 2023-07-30 DIAGNOSIS — E538 Deficiency of other specified B group vitamins: Secondary | ICD-10-CM | POA: Diagnosis present

## 2023-07-30 DIAGNOSIS — E872 Acidosis, unspecified: Secondary | ICD-10-CM | POA: Diagnosis present

## 2023-07-30 DIAGNOSIS — E785 Hyperlipidemia, unspecified: Secondary | ICD-10-CM | POA: Diagnosis present

## 2023-07-30 DIAGNOSIS — R079 Chest pain, unspecified: Secondary | ICD-10-CM | POA: Diagnosis present

## 2023-07-30 DIAGNOSIS — N2581 Secondary hyperparathyroidism of renal origin: Secondary | ICD-10-CM | POA: Diagnosis present

## 2023-07-30 DIAGNOSIS — Z833 Family history of diabetes mellitus: Secondary | ICD-10-CM

## 2023-07-30 DIAGNOSIS — F1910 Other psychoactive substance abuse, uncomplicated: Secondary | ICD-10-CM | POA: Diagnosis present

## 2023-07-30 LAB — COMPREHENSIVE METABOLIC PANEL
ALT: 12 U/L (ref 0–44)
AST: 33 U/L (ref 15–41)
Albumin: 2.3 g/dL — ABNORMAL LOW (ref 3.5–5.0)
Alkaline Phosphatase: 119 U/L (ref 38–126)
Anion gap: 18 — ABNORMAL HIGH (ref 5–15)
BUN: 46 mg/dL — ABNORMAL HIGH (ref 6–20)
CO2: 18 mmol/L — ABNORMAL LOW (ref 22–32)
Calcium: 8.2 mg/dL — ABNORMAL LOW (ref 8.9–10.3)
Chloride: 102 mmol/L (ref 98–111)
Creatinine, Ser: 7.14 mg/dL — ABNORMAL HIGH (ref 0.44–1.00)
GFR, Estimated: 6 mL/min — ABNORMAL LOW (ref >60)
Glucose, Bld: 121 mg/dL — ABNORMAL HIGH (ref 70–99)
Potassium: 4.1 mmol/L (ref 3.5–5.1)
Sodium: 138 mmol/L (ref 135–145)
Total Bilirubin: 1 mg/dL (ref <1.2)
Total Protein: 7 g/dL (ref 6.5–8.1)

## 2023-07-30 LAB — CBC WITH DIFFERENTIAL/PLATELET
Abs Immature Granulocytes: 0.03 10*3/uL (ref 0.00–0.07)
Basophils Absolute: 0 10*3/uL (ref 0.0–0.1)
Basophils Relative: 1 %
Eosinophils Absolute: 0.6 10*3/uL — ABNORMAL HIGH (ref 0.0–0.5)
Eosinophils Relative: 10 %
HCT: 32.7 % — ABNORMAL LOW (ref 36.0–46.0)
Hemoglobin: 10.9 g/dL — ABNORMAL LOW (ref 12.0–15.0)
Immature Granulocytes: 1 %
Lymphocytes Relative: 18 %
Lymphs Abs: 1 10*3/uL (ref 0.7–4.0)
MCH: 28.5 pg (ref 26.0–34.0)
MCHC: 33.3 g/dL (ref 30.0–36.0)
MCV: 85.4 fL (ref 80.0–100.0)
Monocytes Absolute: 0.4 10*3/uL (ref 0.1–1.0)
Monocytes Relative: 8 %
Neutro Abs: 3.6 10*3/uL (ref 1.7–7.7)
Neutrophils Relative %: 62 %
Platelets: 241 10*3/uL (ref 150–400)
RBC: 3.83 MIL/uL — ABNORMAL LOW (ref 3.87–5.11)
RDW: 15.5 % (ref 11.5–15.5)
WBC: 5.7 10*3/uL (ref 4.0–10.5)
nRBC: 0 % (ref 0.0–0.2)

## 2023-07-30 LAB — BRAIN NATRIURETIC PEPTIDE: B Natriuretic Peptide: 623.8 pg/mL — ABNORMAL HIGH (ref 0.0–100.0)

## 2023-07-30 LAB — TROPONIN I (HIGH SENSITIVITY): Troponin I (High Sensitivity): 39 ng/L — ABNORMAL HIGH (ref <18)

## 2023-07-30 MED ORDER — NITROGLYCERIN 0.4 MG SL SUBL
0.4000 mg | SUBLINGUAL_TABLET | SUBLINGUAL | Status: DC | PRN
  Administered 2023-07-30: 0.4 mg via SUBLINGUAL
  Filled 2023-07-30: qty 1

## 2023-07-30 MED ORDER — NITROGLYCERIN IN D5W 200-5 MCG/ML-% IV SOLN
0.0000 ug/min | INTRAVENOUS | Status: DC
  Administered 2023-07-30: 5 ug/min via INTRAVENOUS
  Administered 2023-07-31 (×2): 200 ug/min via INTRAVENOUS
  Filled 2023-07-30 (×3): qty 250

## 2023-07-30 NOTE — ED Provider Notes (Signed)
[place] EMERGENCY DEPARTMENT AT [facility] Provider Note   CSN: [id number] Arrival date & time: 07/30/23  2151     History Chief Complaint  Patient presents with   Shortness of Breath   Chest Pain    Carol [name] is a 62 y.o. female with history of CKD, hypertension, cocaine use, CHF, diabetes presents to the emerged from today for evaluation of central chest pain.  Patient reports that she had smoked crack cocaine and then shortly after started having some central chest pain.  Denies any radiation.  Denies any lightheadedness or dizziness.  She reports some mild shortness of breath. Denies any recent cough or cold symptoms.  She was given 324 mg of aspirin with EMS.  She denies any recent cough or cold symptoms.  Denies any abdominal pain, nausea, vomiting.  She denies any lightheadedness or dizziness.  She reports noncompliance with to her medications.   Shortness of Breath Associated symptoms: chest pain   Associated symptoms: no fever, no headaches and no vomiting   Chest Pain Associated symptoms: shortness of breath   Associated symptoms: no fever, no headache, no nausea and no vomiting        Home Medications Prior to Admission medications   Medication Sig Start Date End Date Taking? Authorizing Provider  albuterol (PROVENTIL HFA;VENTOLIN HFA) 108 (90 Base) MCG/ACT inhaler Inhale 2 puffs into the lungs every 4 (four) hours as needed for wheezing or shortness of breath.    [provider]  amLODipine (NORVASC) 10 MG tablet Take 1 tablet (10 mg total) by mouth daily. 11/01/22   Jacklynn Ganong, FNP  aspirin EC 81 MG tablet Take 1 tablet (81 mg total) by mouth daily. Swallow whole. 05/11/22   Quincy Simmonds, MD  atorvastatin (LIPITOR) 80 MG tablet Take 1 tablet (80 mg total) by mouth daily. 03/11/23   Azucena Fallen, MD  budesonide-formoterol [facility]) 160-4.5 MCG/ACT inhaler Inhale 2 puffs into the lungs every morning. 11/09/21    [provider]  cetirizine (ZYRTEC) 10 MG tablet Take 10 mg by mouth daily as needed for allergies.    [provider]  ergocalciferol (VITAMIN D2) 1.25 MG (50000 UT) capsule Take 1 capsule (50,000 Units total) by mouth once a week. Wednesday 05/11/22   Quincy Simmonds, MD  eszopiclone 3 MG TABS Take 3 mg by mouth at bedtime. Take immediately before bedtime    [provider]  gabapentin (NEURONTIN) 100 MG capsule Take 1 capsule (100 mg total) by mouth 3 (three) times daily. 03/29/23 04/28/23  Arrien, York Ram, MD  hydrALAZINE (APRESOLINE) 50 MG tablet Take 1 tablet (50 mg total) by mouth 3 (three) times daily. 01/07/23 01/07/24  Laurey Morale, MD  isosorbide mononitrate (IMDUR) 30 MG 24 hr tablet Take 0.5 tablets (15 mg total) by mouth daily. NEEDS FOLLOW UP APPOINTMENT FOR MORE REFILLS 06/24/23   Laurey Morale, MD  linagliptin (TRADJENTA) 5 MG TABS tablet Take 1 tablet (5 mg total) by mouth daily. 11/08/22   Ghimire, Werner Lean, MD  melatonin 5 MG TABS Take 1 tablet (5 mg total) by mouth at bedtime as needed (sleep). 11/07/22   Ghimire, Werner Lean, MD  mirtazapine (REMERON) 7.5 MG tablet Take 1 tablet (7.5 mg total) by mouth at bedtime. 11/07/22   Ghimire, Werner Lean, MD  nebivolol (BYSTOLIC) 10 MG tablet Take 1 tablet (10 mg total) by mouth daily. 01/07/23   Bensimhon, Bevelyn Buckles, MD  nitroGLYCERIN (NITROSTAT) 0.4 MG SL tablet Place  1 tablet (0.4 mg total) under the tongue every 5 (five) minutes as needed for chest pain. 05/11/22 11/01/23  Quincy Simmonds, MD  oxyCODONE (OXY IR/ROXICODONE) 5 MG immediate release tablet Take 1 tablet (5 mg total) by mouth every 6 (six) hours as needed for severe pain. 03/29/23   Arrien, York Ram, MD  pantoprazole (PROTONIX) 40 MG tablet Take 1 tablet (40 mg total) by mouth daily at 12 noon. 05/11/22   Quincy Simmonds, MD  potassium chloride (KLOR-CON M) 10 MEQ tablet Take 1 tablet (10 mEq total) by mouth daily. 04/02/23   Milford, Anderson Malta, FNP   potassium chloride SA (KLOR-CON M) 20 MEQ tablet Take 1 tablet (20 mEq total) by mouth daily. 03/29/23   Arrien, York Ram, MD  torsemide (DEMADEX) 20 MG tablet Take 3 tablets (60 mg total) by mouth 2 (two) times daily. 03/29/23 04/28/23  Arrien, York Ram, MD      Allergies    Penicillins and Ultram [tramadol]    Review of Systems   Review of Systems  Constitutional:  Negative for chills and fever.  Eyes:  Negative for photophobia.  Respiratory:  Positive for shortness of breath.   Cardiovascular:  Positive for chest pain.  Gastrointestinal:  Negative for constipation, diarrhea, nausea and vomiting.  Genitourinary:  Negative for dysuria and hematuria.  Neurological:  Negative for syncope and headaches.    Physical Exam Updated Vital Signs BP (!) 252/107   Pulse 96   Resp 19   Ht 5\' 4"  (1.626 m)   Wt 77.1 kg   SpO2 100%   BMI 29.18 kg/m  Physical Exam Constitutional:      General: She is not in acute distress.    Appearance: She is not ill-appearing or toxic-appearing.  Cardiovascular:     Rate and Rhythm: Normal rate.     Pulses:          Radial pulses are 2+ on the right side and 2+ on the left side.       Dorsalis pedis pulses are 2+ on the right side and 2+ on the left side.       Posterior tibial pulses are 2+ on the right side and 2+ on the left side.  Pulmonary:     Effort: Pulmonary effort is normal. Tachypnea present.     Breath sounds: Normal breath sounds.  Musculoskeletal:     Right lower leg: Edema present.     Left lower leg: Edema present.     Comments: Trace to 1+ bilaterally.   Skin:    General: Skin is warm and dry.  Neurological:     Mental Status: She is alert.     ED Results / Procedures / Treatments   Labs (all labs ordered are listed, but only abnormal results are displayed) Labs Reviewed  BRAIN NATRIURETIC PEPTIDE  CBC WITH DIFFERENTIAL/PLATELET  COMPREHENSIVE METABOLIC PANEL  TROPONIN I (HIGH SENSITIVITY)    EKG EKG  Interpretation Date/Time:  Wednesday July 31 2023 01:22:14 EST Ventricular Rate:  96 PR Interval:  149 QRS Duration:  96 QT Interval:  421 QTC Calculation: 533 R Axis:   27  Text Interpretation: Sinus rhythm Probable left atrial enlargement Borderline T abnormalities, anterior leads Prolonged QT interval No acute changes Confirmed by Gilda Crease [phone]) on 07/31/2023 1:42:46 AM  Radiology No results found.  Procedures Procedures   Medications Ordered in ED Medications  nitroGLYCERIN 50 mg in dextrose 5 % 250 mL (0.2 mg/mL) infusion (has no administration in  time range)  nitroGLYCERIN (NITROSTAT) SL tablet 0.4 mg (has no administration in time range)    ED Course/ Medical Decision Making/ A&P Clinical Course as of 07/31/23 0849  Tue Jul 30, 2023  2201 Smoked crack around 45 minutes ago and started to feel SOB. Then started to have chest pain shortly prior to arrival with EMS who gave 325mg  of ASA. Didn't take BP meds this AM. CBG 133 with EMS. HTN in the 250 systolically. [RR]  2205 She reports non compliancy to her Lasix. She reports that all of her medications are hard to manage and she doesn't know what to take when, but reports she usually takes her BP medication every day.  [RR]  2219 Attending assessed the patient at bedside. Will order nitroglycerin SL and nitroglycerin drip [RR]  2304 BP 196 systollic. Patient reports her chest pain is improving. [RR]  2322 Patient has an elevated troponin at 39. Will need delta troponin. Creatnine now significantly elevated from previous at 7.14 from 4.87 three months prior. BUN 46. [RR]  2324 Nursing started nitroglycerin drip and BP went from 216 to 156 after 15 minutes and has been stopped. The patient does not appear in any acute distress. She reports pain went from 8/10 to 5/10.   [RR]  Wed Jul 31, 2023  0122 Patient reports her chest pain is improving. BP is still elevated nitroglycerin drip running. She reports it is now  a 4/10. Lying comfortably on the stretcher, watching TV.  [RR]    Clinical Course User Index [RR] Achille Rich, PA-C                               Medical Decision Making Amount and/or Complexity of Data Reviewed Labs: ordered. Radiology: ordered.  Risk OTC drugs. Prescription drug management. Decision regarding hospitalization.   62 y.o. female presents to the ER for evaluation of chest pain sp crack cocaine use. Differential diagnosis includes but is not limited to ACS, pericarditis, myocarditis, aortic dissection, PE, pneumothorax, esophageal spasm or rupture, chronic angina, pneumonia, bronchitis, GERD, reflux/PUD, biliary disease, pancreatitis, costochondritis, anxiety. Vital signs Significantly elevated BP at 252/107, otherwise unremarkable. Physical exam as noted above. Patient appears comfortable.  On previous chart evaluation, the patient was admitted previously in July and August 2024 for acute on chronic CHF. She has not followed up with cardiology since. She has a documented h/o cocaine/crack use. In 2023, she require transient dialysis from her AKI on CKD.   The patient is non-compliant with her medications. My attending assessed the patient at bedside. Recommends nitroglycerin sublingual and placing the patient on a nitroglycerin drip.   The patient reacted significantly to the SL nitroglycerin, so the drip was held. The patient started to become hypertensive again, so the drip was restarted. It was encouraged to nursing staff to continue titrating up as directed until goal is reached.   I independently reviewed and interpreted the patient's labs.  Urinalysis shows straw-colored urine with 50 glucose present.  100 protein, trace leuks, and rare bacteria present.  She is not currently having any dysuria or urinary symptoms.  UDS is positive for cocaine.  CMP shows decrease in bicarb at 18.  Glucose at 121.  BUN at 46 with creatinine at 7.14.  Calcium 8.2 with an albumin of 2.3.   She has an anion gap of 18.  Lactic acid within normal limits.  Initial troponin at 3 9 with repeat At 45.  Will continue to trend.  BNP appears to be the lowest it is been recently at 623.8.  CBC shows anemia which actually improved from previous counts.  Patient's blood pressure continued to improve with the nitro drip.  She is currently at a 4 out of 10.  Her blood pressure is improved now systolically in the 190s.  She has some trace to 1+ pitting edema in her bilateral lower extremities however does not look extremely volume overloaded, nor does she look dry.  She is having some chest pain however it slowly relieved with the control of blood pressure.  Discussed this with my attending.  Do not think any CT dissection imaging is needed at this time.  EKG reviewed and interpreted by my attending and read as Sinus rhythm Probable left atrial enlargement Borderline T abnormalities, anterior leads Prolonged QT interval No acute changes.  Troponins are slightly elevated but flat, likely troponin leak from hypertension.  Will admit for blood pressure control.  Portions of this report may have been transcribed using voice recognition software. Every effort was made to ensure accuracy; however, inadvertent computerized transcription errors may be present.   I discussed this case with my attending physician who cosigned this note including patient's presenting symptoms, physical exam, and planned diagnostics and interventions. Attending physician stated agreement with plan or made changes to plan which were implemented.   Attending physician assessed patient at bedside.  Final Clinical Impression(s) / ED Diagnoses Final diagnoses:  Hypertensive emergency    Rx / DC Orders ED Discharge Orders     None         Achille Rich, PA-C 08/02/23 9147    Laurence Spates, MD 08/02/23 2245

## 2023-07-30 NOTE — ED Provider Notes (Incomplete)
[place] EMERGENCY DEPARTMENT AT [facility] Provider Note   CSN: [id number] Arrival date & time: 07/30/23  2151     History {Add pertinent medical, surgical, social history, OB history to HPI:1} Chief Complaint  Patient presents with  . Shortness of Breath  . Chest Pain    Carol [name] is a 62 y.o. female.   Shortness of Breath Associated symptoms: chest pain   Chest Pain Associated symptoms: shortness of breath        Home Medications Prior to Admission medications   Medication Sig Start Date End Date Taking? Authorizing Provider  albuterol (PROVENTIL HFA;VENTOLIN HFA) 108 (90 Base) MCG/ACT inhaler Inhale 2 puffs into the lungs every 4 (four) hours as needed for wheezing or shortness of breath.    [provider]  amLODipine (NORVASC) 10 MG tablet Take 1 tablet (10 mg total) by mouth daily. 11/01/22   Jacklynn Ganong, FNP  aspirin EC 81 MG tablet Take 1 tablet (81 mg total) by mouth daily. Swallow whole. 05/11/22   Quincy Simmonds, MD  atorvastatin (LIPITOR) 80 MG tablet Take 1 tablet (80 mg total) by mouth daily. 03/11/23   Azucena Fallen, MD  budesonide-formoterol [facility]) 160-4.5 MCG/ACT inhaler Inhale 2 puffs into the lungs every morning. 11/09/21   [provider]  cetirizine (ZYRTEC) 10 MG tablet Take 10 mg by mouth daily as needed for allergies.    [provider]  ergocalciferol (VITAMIN D2) 1.25 MG (50000 UT) capsule Take 1 capsule (50,000 Units total) by mouth once a week. Wednesday 05/11/22   Quincy Simmonds, MD  eszopiclone 3 MG TABS Take 3 mg by mouth at bedtime. Take immediately before bedtime    [provider]  gabapentin (NEURONTIN) 100 MG capsule Take 1 capsule (100 mg total) by mouth 3 (three) times daily. 03/29/23 04/28/23  Arrien, York Ram, MD  hydrALAZINE (APRESOLINE) 50 MG tablet Take 1 tablet (50 mg total) by mouth 3 (three) times daily. 01/07/23 01/07/24  Laurey Morale, MD   isosorbide mononitrate (IMDUR) 30 MG 24 hr tablet Take 0.5 tablets (15 mg total) by mouth daily. NEEDS FOLLOW UP APPOINTMENT FOR MORE REFILLS 06/24/23   Laurey Morale, MD  linagliptin (TRADJENTA) 5 MG TABS tablet Take 1 tablet (5 mg total) by mouth daily. 11/08/22   Ghimire, Werner Lean, MD  melatonin 5 MG TABS Take 1 tablet (5 mg total) by mouth at bedtime as needed (sleep). 11/07/22   Ghimire, Werner Lean, MD  mirtazapine (REMERON) 7.5 MG tablet Take 1 tablet (7.5 mg total) by mouth at bedtime. 11/07/22   Ghimire, Werner Lean, MD  nebivolol (BYSTOLIC) 10 MG tablet Take 1 tablet (10 mg total) by mouth daily. 01/07/23   Bensimhon, Bevelyn Buckles, MD  nitroGLYCERIN (NITROSTAT) 0.4 MG SL tablet Place 1 tablet (0.4 mg total) under the tongue every 5 (five) minutes as needed for chest pain. 05/11/22 11/01/23  Quincy Simmonds, MD  oxyCODONE (OXY IR/ROXICODONE) 5 MG immediate release tablet Take 1 tablet (5 mg total) by mouth every 6 (six) hours as needed for severe pain. 03/29/23   Arrien, York Ram, MD  pantoprazole (PROTONIX) 40 MG tablet Take 1 tablet (40 mg total) by mouth daily at 12 noon. 05/11/22   Quincy Simmonds, MD  potassium chloride (KLOR-CON M) 10 MEQ tablet Take 1 tablet (10 mEq total) by mouth daily. 04/02/23   Milford, Anderson Malta, FNP  potassium chloride SA (KLOR-CON M) 20 MEQ tablet Take 1 tablet (20 mEq total) by  mouth daily. 03/29/23   Arrien, York Ram, MD  torsemide (DEMADEX) 20 MG tablet Take 3 tablets (60 mg total) by mouth 2 (two) times daily. 03/29/23 04/28/23  Arrien, York Ram, MD      Allergies    Penicillins and Ultram [tramadol]    Review of Systems   Review of Systems  Respiratory:  Positive for shortness of breath.   Cardiovascular:  Positive for chest pain.    Physical Exam Updated Vital Signs BP (!) 252/107   Pulse 96   Resp 19   Ht 5\' 4"  (1.626 m)   Wt 77.1 kg   SpO2 100%   BMI 29.18 kg/m  Physical Exam  ED Results / Procedures / Treatments   Labs (all labs  ordered are listed, but only abnormal results are displayed) Labs Reviewed  BRAIN NATRIURETIC PEPTIDE  CBC WITH DIFFERENTIAL/PLATELET  COMPREHENSIVE METABOLIC PANEL  TROPONIN I (HIGH SENSITIVITY)    EKG None  Radiology No results found.  Procedures Procedures  {Document cardiac monitor, telemetry assessment procedure when appropriate:1}  Medications Ordered in ED Medications  nitroGLYCERIN 50 mg in dextrose 5 % 250 mL (0.2 mg/mL) infusion (has no administration in time range)  nitroGLYCERIN (NITROSTAT) SL tablet 0.4 mg (has no administration in time range)    ED Course/ Medical Decision Making/ A&P Clinical Course as of 07/30/23 2219  Tue Jul 30, 2023  2201 Smoked crack around 45 minutes ago and started to feel SOB. Then started to have chest pain shortly prior to arrival with EMS who gave 325mg  of ASA. Didn't take BP meds this AM. CBG 133 with EMS. HTN in the 250 systolically. [RR]  2205 She reports non compliancy to her Lasix. She reports that all of her medications are hard to manage and she doesn't know what to take when, but reports she usually takes her BP medication every day.  [RR]    Clinical Course User Index [RR] Achille Rich, PA-C   {   Click here for ABCD2, HEART and other calculatorsREFRESH Note before signing :1}                              Medical Decision Making Amount and/or Complexity of Data Reviewed Labs: ordered. Radiology: ordered.  Risk Prescription drug management.   ***  {Document critical care time when appropriate:1} {Document review of labs and clinical decision tools ie heart score, Chads2Vasc2 etc:1}  {Document your independent review of radiology images, and any outside records:1} {Document your discussion with family members, caretakers, and with consultants:1} {Document social determinants of health affecting pt's care:1} {Document your decision making why or why not admission, treatments were needed:1} Final Clinical  Impression(s) / ED Diagnoses Final diagnoses:  None    Rx / DC Orders ED Discharge Orders     None

## 2023-07-30 NOTE — ED Provider Notes (Signed)
62 year old female history of type 2 diabetes, hypertension, hyperlipidemia, cocaine use, CHF presenting for chest pain.  She states earlier today she used cocaine.  Shortly after she developed gradual onset substernal nonradiating chest pain.  Similar to prior episodes before.  No radiation pain to her back or neck or arms.  She arrives markedly hypertensive into the 250s systolic.  Her EKG shows sinus rhythm, prolonged QTc but no clear ischemic changes or changes from prior.  No STEMI.  She was given nitroglycerin and started on nitro drip with stable improvement of her blood pressure.  Goal around 200 systolic.  I considered dissection, however nature of her pain seems less consistent with this as well as her exam.  Reviewed her chest x-ray without mediastinal widening.  BNP is mildly elevated,'s could be somewhat volume up.  She does have worsening kidney function with AKI and significant CKD which limits evaluation with contrast as well.  Overall think this is more consistent with hypertensive emergency in setting of cocaine use per initial troponin is 39 will trend.  I have not heparinized at this point.  Chest pain significantly improved with blood pressure control.

## 2023-07-30 NOTE — ED Triage Notes (Signed)
Pt arrives via [facility] from home for c/o shortness of breath that began approx. 1 hour pta tonight immediately after she smoked some crack. EMS states that as they were backing in pt also expressed she was having some chest pain, and pt was given 324mg  ASA at that time. NAD noted at this time. Achille Rich, PA-C at bedside during triage.   Pt has history of MI, CHF, ESRD, HTN but is not completely sure if she takes all of the right medications daily because she gets them all confused per pt.

## 2023-07-31 ENCOUNTER — Encounter (HOSPITAL_COMMUNITY): Payer: Self-pay

## 2023-07-31 ENCOUNTER — Inpatient Hospital Stay (HOSPITAL_COMMUNITY): Payer: 59

## 2023-07-31 DIAGNOSIS — I2489 Other forms of acute ischemic heart disease: Secondary | ICD-10-CM | POA: Diagnosis present

## 2023-07-31 DIAGNOSIS — E1122 Type 2 diabetes mellitus with diabetic chronic kidney disease: Secondary | ICD-10-CM | POA: Diagnosis present

## 2023-07-31 DIAGNOSIS — N185 Chronic kidney disease, stage 5: Secondary | ICD-10-CM

## 2023-07-31 DIAGNOSIS — I161 Hypertensive emergency: Principal | ICD-10-CM | POA: Diagnosis present

## 2023-07-31 DIAGNOSIS — I5032 Chronic diastolic (congestive) heart failure: Secondary | ICD-10-CM

## 2023-07-31 DIAGNOSIS — N186 End stage renal disease: Secondary | ICD-10-CM | POA: Diagnosis present

## 2023-07-31 DIAGNOSIS — G20A1 Parkinson's disease without dyskinesia, without mention of fluctuations: Secondary | ICD-10-CM | POA: Diagnosis present

## 2023-07-31 DIAGNOSIS — R7989 Other specified abnormal findings of blood chemistry: Secondary | ICD-10-CM

## 2023-07-31 DIAGNOSIS — I252 Old myocardial infarction: Secondary | ICD-10-CM | POA: Diagnosis not present

## 2023-07-31 DIAGNOSIS — N179 Acute kidney failure, unspecified: Secondary | ICD-10-CM | POA: Diagnosis present

## 2023-07-31 DIAGNOSIS — I132 Hypertensive heart and chronic kidney disease with heart failure and with stage 5 chronic kidney disease, or end stage renal disease: Secondary | ICD-10-CM | POA: Diagnosis present

## 2023-07-31 DIAGNOSIS — R9431 Abnormal electrocardiogram [ECG] [EKG]: Secondary | ICD-10-CM

## 2023-07-31 DIAGNOSIS — R609 Edema, unspecified: Secondary | ICD-10-CM | POA: Diagnosis not present

## 2023-07-31 DIAGNOSIS — F17210 Nicotine dependence, cigarettes, uncomplicated: Secondary | ICD-10-CM | POA: Diagnosis present

## 2023-07-31 DIAGNOSIS — D638 Anemia in other chronic diseases classified elsewhere: Secondary | ICD-10-CM | POA: Diagnosis present

## 2023-07-31 DIAGNOSIS — F419 Anxiety disorder, unspecified: Secondary | ICD-10-CM | POA: Diagnosis present

## 2023-07-31 DIAGNOSIS — E785 Hyperlipidemia, unspecified: Secondary | ICD-10-CM

## 2023-07-31 DIAGNOSIS — E538 Deficiency of other specified B group vitamins: Secondary | ICD-10-CM | POA: Diagnosis present

## 2023-07-31 DIAGNOSIS — J449 Chronic obstructive pulmonary disease, unspecified: Secondary | ICD-10-CM | POA: Diagnosis present

## 2023-07-31 DIAGNOSIS — Z8249 Family history of ischemic heart disease and other diseases of the circulatory system: Secondary | ICD-10-CM | POA: Diagnosis not present

## 2023-07-31 DIAGNOSIS — K219 Gastro-esophageal reflux disease without esophagitis: Secondary | ICD-10-CM | POA: Diagnosis present

## 2023-07-31 DIAGNOSIS — F1410 Cocaine abuse, uncomplicated: Secondary | ICD-10-CM | POA: Diagnosis present

## 2023-07-31 DIAGNOSIS — I509 Heart failure, unspecified: Secondary | ICD-10-CM | POA: Diagnosis not present

## 2023-07-31 DIAGNOSIS — G8929 Other chronic pain: Secondary | ICD-10-CM | POA: Diagnosis present

## 2023-07-31 DIAGNOSIS — R079 Chest pain, unspecified: Secondary | ICD-10-CM | POA: Diagnosis present

## 2023-07-31 DIAGNOSIS — E8729 Other acidosis: Secondary | ICD-10-CM

## 2023-07-31 DIAGNOSIS — E872 Acidosis, unspecified: Secondary | ICD-10-CM | POA: Diagnosis present

## 2023-07-31 DIAGNOSIS — E1169 Type 2 diabetes mellitus with other specified complication: Secondary | ICD-10-CM

## 2023-07-31 DIAGNOSIS — Z841 Family history of disorders of kidney and ureter: Secondary | ICD-10-CM | POA: Diagnosis not present

## 2023-07-31 DIAGNOSIS — N2581 Secondary hyperparathyroidism of renal origin: Secondary | ICD-10-CM | POA: Diagnosis present

## 2023-07-31 DIAGNOSIS — Z992 Dependence on renal dialysis: Secondary | ICD-10-CM | POA: Diagnosis not present

## 2023-07-31 DIAGNOSIS — F1910 Other psychoactive substance abuse, uncomplicated: Secondary | ICD-10-CM | POA: Diagnosis present

## 2023-07-31 DIAGNOSIS — D631 Anemia in chronic kidney disease: Secondary | ICD-10-CM | POA: Diagnosis present

## 2023-07-31 LAB — RAPID URINE DRUG SCREEN, HOSP PERFORMED
Amphetamines: NOT DETECTED
Barbiturates: NOT DETECTED
Benzodiazepines: NOT DETECTED
Cocaine: POSITIVE — AB
Opiates: NOT DETECTED
Tetrahydrocannabinol: NOT DETECTED

## 2023-07-31 LAB — URINALYSIS, W/ REFLEX TO CULTURE (INFECTION SUSPECTED)
Bilirubin Urine: NEGATIVE
Glucose, UA: 50 mg/dL — AB
Hgb urine dipstick: NEGATIVE
Ketones, ur: NEGATIVE mg/dL
Nitrite: NEGATIVE
Protein, ur: 100 mg/dL — AB
Specific Gravity, Urine: 1.005 (ref 1.005–1.030)
pH: 7 (ref 5.0–8.0)

## 2023-07-31 LAB — IRON AND TIBC
Iron: 64 ug/dL (ref 28–170)
Saturation Ratios: 32 % — ABNORMAL HIGH (ref 10.4–31.8)
TIBC: 200 ug/dL — ABNORMAL LOW (ref 250–450)
UIBC: 136 ug/dL

## 2023-07-31 LAB — CK: Total CK: 200 U/L (ref 38–234)

## 2023-07-31 LAB — RENAL FUNCTION PANEL
Albumin: 1.7 g/dL — ABNORMAL LOW (ref 3.5–5.0)
Anion gap: 14 (ref 5–15)
BUN: 41 mg/dL — ABNORMAL HIGH (ref 6–20)
CO2: 17 mmol/L — ABNORMAL LOW (ref 22–32)
Calcium: 7.7 mg/dL — ABNORMAL LOW (ref 8.9–10.3)
Chloride: 104 mmol/L (ref 98–111)
Creatinine, Ser: 7.1 mg/dL — ABNORMAL HIGH (ref 0.44–1.00)
GFR, Estimated: 6 mL/min — ABNORMAL LOW (ref >60)
Glucose, Bld: 147 mg/dL — ABNORMAL HIGH (ref 70–99)
Phosphorus: 5.2 mg/dL — ABNORMAL HIGH (ref 2.5–4.6)
Potassium: 3.4 mmol/L — ABNORMAL LOW (ref 3.5–5.1)
Sodium: 135 mmol/L (ref 135–145)

## 2023-07-31 LAB — HEPATITIS B SURFACE ANTIGEN: Hepatitis B Surface Ag: NONREACTIVE

## 2023-07-31 LAB — TROPONIN I (HIGH SENSITIVITY)
Troponin I (High Sensitivity): 45 ng/L — ABNORMAL HIGH (ref <18)
Troponin I (High Sensitivity): 49 ng/L — ABNORMAL HIGH (ref <18)
Troponin I (High Sensitivity): 48 ng/L — ABNORMAL HIGH (ref <18)

## 2023-07-31 LAB — FERRITIN: Ferritin: 374 ng/mL — ABNORMAL HIGH (ref 11–307)

## 2023-07-31 LAB — I-STAT CG4 LACTIC ACID, ED: Lactic Acid, Venous: 0.8 mmol/L (ref 0.5–1.9)

## 2023-07-31 LAB — ETHANOL: Alcohol, Ethyl (B): <10 mg/dL (ref <10)

## 2023-07-31 LAB — MAGNESIUM: Magnesium: 1.4 mg/dL — ABNORMAL LOW (ref 1.7–2.4)

## 2023-07-31 LAB — CBG MONITORING, ED: Glucose-Capillary: 101 mg/dL — ABNORMAL HIGH (ref 70–99)

## 2023-07-31 LAB — CREATININE, URINE, RANDOM: Creatinine, Urine: 24 mg/dL

## 2023-07-31 LAB — SODIUM, URINE, RANDOM: Sodium, Ur: 62 mmol/L

## 2023-07-31 MED ORDER — ZOLPIDEM TARTRATE 5 MG PO TABS
5.0000 mg | ORAL_TABLET | Freq: Every evening | ORAL | Status: DC | PRN
  Administered 2023-08-02 – 2023-08-04 (×3): 5 mg via ORAL
  Filled 2023-07-31 (×3): qty 1

## 2023-07-31 MED ORDER — SODIUM CHLORIDE 0.9% FLUSH
3.0000 mL | Freq: Two times a day (BID) | INTRAVENOUS | Status: DC
  Administered 2023-07-31 – 2023-08-05 (×8): 3 mL via INTRAVENOUS

## 2023-07-31 MED ORDER — MELATONIN 5 MG PO TABS
5.0000 mg | ORAL_TABLET | Freq: Every evening | ORAL | Status: DC | PRN
  Administered 2023-07-31 – 2023-08-01 (×2): 5 mg via ORAL
  Filled 2023-07-31 (×2): qty 1

## 2023-07-31 MED ORDER — CALCITRIOL 0.25 MCG PO CAPS
0.2500 ug | ORAL_CAPSULE | Freq: Every day | ORAL | Status: DC
  Administered 2023-08-02 – 2023-08-05 (×4): 0.25 ug via ORAL
  Filled 2023-07-31 (×5): qty 1

## 2023-07-31 MED ORDER — LORATADINE 10 MG PO TABS
10.0000 mg | ORAL_TABLET | Freq: Every day | ORAL | Status: DC
  Administered 2023-07-31 – 2023-08-05 (×5): 10 mg via ORAL
  Filled 2023-07-31 (×5): qty 1

## 2023-07-31 MED ORDER — OXYCODONE-ACETAMINOPHEN 5-325 MG PO TABS
1.0000 | ORAL_TABLET | ORAL | Status: DC | PRN
  Administered 2023-07-31 – 2023-08-05 (×11): 1 via ORAL
  Filled 2023-07-31 (×11): qty 1

## 2023-07-31 MED ORDER — OXYCODONE HCL 5 MG PO TABS
5.0000 mg | ORAL_TABLET | Freq: Three times a day (TID) | ORAL | Status: DC | PRN
  Administered 2023-07-31: 5 mg via ORAL
  Filled 2023-07-31: qty 1

## 2023-07-31 MED ORDER — OXYCODONE HCL 5 MG PO TABS
5.0000 mg | ORAL_TABLET | ORAL | Status: DC | PRN
  Administered 2023-08-02 – 2023-08-05 (×7): 5 mg via ORAL
  Filled 2023-07-31 (×7): qty 1

## 2023-07-31 MED ORDER — CHLORHEXIDINE GLUCONATE CLOTH 2 % EX PADS
6.0000 | MEDICATED_PAD | Freq: Every day | CUTANEOUS | Status: DC
  Administered 2023-08-01 – 2023-08-05 (×5): 6 via TOPICAL

## 2023-07-31 MED ORDER — ARFORMOTEROL TARTRATE 15 MCG/2ML IN NEBU
15.0000 ug | INHALATION_SOLUTION | Freq: Two times a day (BID) | RESPIRATORY_TRACT | Status: DC
  Administered 2023-07-31 – 2023-08-05 (×8): 15 ug via RESPIRATORY_TRACT
  Filled 2023-07-31 (×11): qty 2

## 2023-07-31 MED ORDER — AMLODIPINE BESYLATE 10 MG PO TABS
10.0000 mg | ORAL_TABLET | Freq: Every day | ORAL | Status: DC
  Administered 2023-07-31 – 2023-08-05 (×5): 10 mg via ORAL
  Filled 2023-07-31: qty 2
  Filled 2023-07-31 (×4): qty 1

## 2023-07-31 MED ORDER — ALBUTEROL SULFATE (2.5 MG/3ML) 0.083% IN NEBU: 2.5000 mg | INHALATION_SOLUTION | Freq: Four times a day (QID) | RESPIRATORY_TRACT | Status: DC | PRN

## 2023-07-31 MED ORDER — NAPHAZOLINE-PHENIRAMINE 0.025-0.3 % OP SOLN
2.0000 [drp] | Freq: Two times a day (BID) | OPHTHALMIC | Status: DC
  Administered 2023-07-31 – 2023-08-05 (×8): 2 [drp] via OPHTHALMIC
  Filled 2023-07-31: qty 15

## 2023-07-31 MED ORDER — TRIMETHOBENZAMIDE HCL 100 MG/ML IM SOLN
200.0000 mg | Freq: Four times a day (QID) | INTRAMUSCULAR | Status: DC | PRN
  Administered 2023-08-03 – 2023-08-05 (×4): 200 mg via INTRAMUSCULAR
  Filled 2023-07-31 (×6): qty 2

## 2023-07-31 MED ORDER — ACETAMINOPHEN 325 MG PO TABS
650.0000 mg | ORAL_TABLET | Freq: Four times a day (QID) | ORAL | Status: DC | PRN
  Administered 2023-08-03 – 2023-08-04 (×2): 650 mg via ORAL
  Filled 2023-07-31 (×2): qty 2

## 2023-07-31 MED ORDER — TRIAMCINOLONE ACETONIDE 0.1 % EX OINT
1.0000 | TOPICAL_OINTMENT | Freq: Two times a day (BID) | CUTANEOUS | Status: DC | PRN
  Filled 2023-07-31: qty 15

## 2023-07-31 MED ORDER — OXYCODONE-ACETAMINOPHEN 5-325 MG PO TABS
1.0000 | ORAL_TABLET | Freq: Three times a day (TID) | ORAL | Status: DC | PRN
  Administered 2023-07-31: 1 via ORAL
  Filled 2023-07-31: qty 1

## 2023-07-31 MED ORDER — ASPIRIN 81 MG PO TBEC
81.0000 mg | DELAYED_RELEASE_TABLET | Freq: Every day | ORAL | Status: DC
  Administered 2023-07-31 – 2023-08-05 (×5): 81 mg via ORAL
  Filled 2023-07-31 (×5): qty 1

## 2023-07-31 MED ORDER — OXYCODONE-ACETAMINOPHEN 10-325 MG PO TABS: 1.0000 | ORAL_TABLET | Freq: Three times a day (TID) | ORAL | Status: DC | PRN

## 2023-07-31 MED ORDER — HYDRALAZINE HCL 50 MG PO TABS
100.0000 mg | ORAL_TABLET | Freq: Three times a day (TID) | ORAL | Status: DC
  Administered 2023-07-31 – 2023-08-05 (×15): 100 mg via ORAL
  Filled 2023-07-31 (×15): qty 2

## 2023-07-31 MED ORDER — LINAGLIPTIN 5 MG PO TABS
5.0000 mg | ORAL_TABLET | Freq: Every day | ORAL | Status: DC
  Administered 2023-07-31 – 2023-08-02 (×3): 5 mg via ORAL
  Filled 2023-07-31 (×3): qty 1

## 2023-07-31 MED ORDER — BUDESONIDE 0.5 MG/2ML IN SUSP
0.5000 mg | Freq: Two times a day (BID) | RESPIRATORY_TRACT | Status: DC
  Administered 2023-07-31 – 2023-08-05 (×8): 0.5 mg via RESPIRATORY_TRACT
  Filled 2023-07-31 (×11): qty 2

## 2023-07-31 MED ORDER — ATORVASTATIN CALCIUM 80 MG PO TABS
80.0000 mg | ORAL_TABLET | Freq: Every day | ORAL | Status: DC
  Administered 2023-07-31 – 2023-08-05 (×5): 80 mg via ORAL
  Filled 2023-07-31 (×4): qty 1
  Filled 2023-07-31: qty 2

## 2023-07-31 MED ORDER — ACETAMINOPHEN 650 MG RE SUPP: 650.0000 mg | Freq: Four times a day (QID) | RECTAL | Status: DC | PRN

## 2023-07-31 MED ORDER — ACETAMINOPHEN 325 MG PO TABS
650.0000 mg | ORAL_TABLET | Freq: Once | ORAL | Status: AC
  Administered 2023-07-31: 650 mg via ORAL
  Filled 2023-07-31: qty 2

## 2023-07-31 MED ORDER — GABAPENTIN 100 MG PO CAPS
100.0000 mg | ORAL_CAPSULE | Freq: Two times a day (BID) | ORAL | Status: DC
Start: 2023-07-31 — End: 2023-08-05
  Administered 2023-07-31 – 2023-08-05 (×9): 100 mg via ORAL
  Filled 2023-07-31 (×10): qty 1

## 2023-07-31 MED ORDER — HYDRALAZINE HCL 20 MG/ML IJ SOLN
10.0000 mg | INTRAMUSCULAR | Status: DC | PRN
  Administered 2023-07-31: 10 mg via INTRAVENOUS
  Filled 2023-07-31: qty 1

## 2023-07-31 MED ORDER — LORAZEPAM 2 MG/ML IJ SOLN
0.5000 mg | Freq: Four times a day (QID) | INTRAMUSCULAR | Status: DC | PRN
  Administered 2023-07-31: 0.5 mg via INTRAVENOUS
  Filled 2023-07-31: qty 1

## 2023-07-31 MED ORDER — HEPARIN SODIUM (PORCINE) 5000 UNIT/ML IJ SOLN
5000.0000 [IU] | Freq: Three times a day (TID) | INTRAMUSCULAR | Status: DC
  Administered 2023-07-31 – 2023-08-05 (×15): 5000 [IU] via SUBCUTANEOUS
  Filled 2023-07-31 (×16): qty 1

## 2023-07-31 NOTE — ED Notes (Signed)
Victory Dakin, PA-C at bedside for pt re-evaluation at this time. Informed that repeat lactic acid level not indicated since initial value wnl. Urinalysis obtained and okay given to send it for analysis.

## 2023-07-31 NOTE — ED Notes (Signed)
Per Dr. Blinda Leatherwood at bedside SBP goal of less than 180 acceptable at this time. Drip titrated as ordered by provider.

## 2023-07-31 NOTE — Progress Notes (Signed)
Bilateral upper extremity vein mapping has been completed. Preliminary results can be found in CV Proc through chart review.   07/31/23 3:57 PM Olen Cordial RVT

## 2023-07-31 NOTE — Progress Notes (Signed)
CSW received consult for patient due to an active substance user. CSW added substance abuse resources to patient's AVS.  Edwin Dada, MSW, LCSW Transitions of Care  Clinical Social Worker II [phone]

## 2023-07-31 NOTE — ED Notes (Signed)
Stopped nitroglycerin drip per verbal order from Dr. Katrinka Blazing

## 2023-07-31 NOTE — Discharge Instructions (Signed)

## 2023-07-31 NOTE — ED Notes (Signed)
Lunch tray at bedside. ?

## 2023-07-31 NOTE — ED Notes (Signed)
ED TO INPATIENT HANDOFF REPORT  ED Nurse Name and Phone #: Sheilah Mins [phone]  S Name/Age/Gender Carol [name] 62 y.o. female Room/Bed: RESUSC/RESUSC  Code Status   Code Status: Prior  Home/SNF/Other Home Patient oriented to: self, place, time, and situation Is this baseline? Yes   Triage Complete: Triage complete  Chief Complaint [facility]  Triage Note Pt arrives via [facility] from home for c/o shortness of breath that began approx. 1 hour pta tonight immediately after she smoked some crack. EMS states that as they were backing in pt also expressed she was having some chest pain, and pt was given 324mg  ASA at that time. NAD noted at this time. Achille Rich, PA-C at bedside during triage.   Pt has history of MI, CHF, ESRD, HTN but is not completely sure if she takes all of the right medications daily because she gets them all confused per pt.     Allergies Allergies  Allergen Reactions   Penicillins Shortness Of Breath and Other (See Comments)    Caused yeast infection Has patient had a PCN reaction causing immediate rash, facial/tongue/throat swelling, SOB or lightheadedness with hypotension: Yes Has patient had a PCN reaction causing severe rash involving mucus membranes or skin necrosis: No Has patient had a PCN reaction that required hospitalization pt was in the hospital at the time of the reaction Has patient had a PCN reaction occurring within the last 10 years: No If all of the above answers are "NO", then may proceed with Cephalosp   Ultram [Tramadol] Other (See Comments)    Made her tongue raw    Level of Care/Admitting Diagnosis ED Disposition     ED Disposition  Admit   Condition  --   Comment  The patient appears reasonably stabilized for admission considering the current resources, [name], and capabilities available in the ED at this time, and I doubt any other [facility] requiring further screening and/or treatment in the ED prior to admission is  present.           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   Chronic back pain    Depression    Diabetes mellitus    GERD (gastroesophageal reflux disease)    Heart murmur    Hypertension    NSTEMI (non-ST elevated myocardial infarction) (HCC) 12/20/2021   Palpitations    Parkinson's disease (HCC)    Past Surgical History:  Procedure Laterality Date   BALLOON DILATION N/A 11/06/2022   Procedure: BALLOON DILATION;  Surgeon: Napoleon Form, MD;  Location: [facility] ENDOSCOPY;  Service: Gastroenterology;  Laterality: N/A;   CARPAL TUNNEL RELEASE     COLONOSCOPY WITH PROPOFOL N/A 11/06/2022   Procedure: COLONOSCOPY WITH PROPOFOL;  Surgeon: Napoleon Form, MD;  Location: [facility] ENDOSCOPY;  Service: Gastroenterology;  Laterality: N/A;   ESOPHAGOGASTRODUODENOSCOPY (EGD) WITH PROPOFOL N/A 11/06/2022   Procedure: ESOPHAGOGASTRODUODENOSCOPY (EGD) WITH PROPOFOL;  Surgeon: Napoleon Form, MD;  Location: [facility] ENDOSCOPY;  Service: Gastroenterology;  Laterality: N/A;   IR FLUORO GUIDE CV LINE RIGHT  01/26/2022   IR US GUIDE VASC ACCESS RIGHT  01/26/2022   LEFT HEART CATH AND CORONARY ANGIOGRAPHY N/A 12/20/2021   Procedure: LEFT HEART CATH AND CORONARY ANGIOGRAPHY;  Surgeon: Kathleene Hazel, MD;  Location: [facility] INVASIVE CV LAB;  Service: Cardiovascular;  Laterality: N/A;   OPEN REDUCTION INTERNAL FIXATION (ORIF) FINGER WITH RADIAL BONE GRAFT Right 09/12/2015   Procedure: RIGHT MIDDLE FINGER CLOSED REDUCTION AND PINNING;  Surgeon: Bradly Bienenstock, MD;  Location: [facility] OR;  Service: Orthopedics;  Laterality: Right;   TONSILLECTOMY     TUBAL LIGATION       A IV Location/Drains/Wounds Patient Lines/Drains/Airways Status     Active Line/Drains/Airways     Name Placement date Placement time Site Days    Peripheral IV 07/30/23 20 G Left Antecubital 07/30/23  2130  Antecubital  1   Peripheral IV 07/30/23 20 G Anterior;Left Forearm 07/30/23  2230  Forearm  1   External Urinary Catheter 07/31/23  0010  --  less than 1            Intake/Output Last 24 hours  Intake/Output Summary (Last 24 hours) at 07/31/2023 6578 Last data filed at 07/31/2023 4696 Gross per 24 hour  Intake 131.29 ml  Output --  Net 131.29 ml    Labs/Imaging Results for orders placed or performed during the hospital encounter of 07/30/23 (from the past 48 hour(s))  Troponin I (High Sensitivity)     Status: Abnormal   Collection Time: 07/30/23 10:36 PM  Result Value Ref Range   Troponin I (High Sensitivity) 39 (H) <18 ng/L    Comment: (NOTE) Elevated high sensitivity troponin I (hsTnI) values and significant  changes across serial measurements may suggest ACS but many other  chronic and acute conditions are known to elevate hsTnI results.  Refer to the "Links" section for chest pain algorithms and additional  guidance. Performed at [facility], [address], [place], [place] [address]   Brain natriuretic peptide     Status: Abnormal   Collection Time: 07/30/23 10:36 PM  Result Value Ref Range   B Natriuretic Peptide 623.8 (H) 0.0 - 100.0 pg/mL    Comment: Performed at [facility], [address] [place], [place] [address]  CBC with Differential     Status: Abnormal   Collection Time: 07/30/23 10:36 PM  Result Value Ref Range   WBC 5.7 4.0 - 10.5 K/uL   RBC 3.83 (L) 3.87 - 5.11 MIL/uL   Hemoglobin 10.9 (L) 12.0 - 15.0 g/dL   HCT 40.1 (L) 02.7 - 25.3 %   MCV 85.4 80.0 - 100.0 fL   MCH 28.5 26.0 - 34.0 pg   MCHC 33.3 30.0 - 36.0 g/dL   RDW 66.4 40.3 - 47.4 %   Platelets 241 150 - 400 K/uL   nRBC 0.0 0.0 - 0.2 %   Neutrophils Relative % 62 %   Neutro Abs 3.6 1.7 - 7.7 K/uL   Lymphocytes Relative 18 %   Lymphs Abs 1.0 0.7 - 4.0 K/uL   Monocytes Relative 8 %   Monocytes Absolute 0.4 0.1  - 1.0 K/uL   Eosinophils Relative 10 %   Eosinophils Absolute 0.6 (H) 0.0 - 0.5 K/uL   Basophils Relative 1 %   Basophils Absolute 0.0 0.0 - 0.1 K/uL   Immature Granulocytes 1 %   Abs Immature Granulocytes 0.03 0.00 - 0.07 K/uL    Comment: Performed at [facility], [address] [place], [place] [address]  Comprehensive metabolic panel     Status: Abnormal   Collection Time: 07/30/23 10:36 PM  Result Value Ref Range   Sodium 138 135 - 145 mmol/L   Potassium 4.1 3.5 - 5.1 mmol/L    Comment: HEMOLYSIS AT THIS  LEVEL MAY AFFECT RESULT   Chloride 102 98 - 111 mmol/L   CO2 18 (L) 22 - 32 mmol/L   Glucose, Bld 121 (H) 70 - 99 mg/dL    Comment: Glucose reference range applies only to samples taken after fasting for at least 8 hours.   BUN 46 (H) 6 - 20 mg/dL   Creatinine, Ser 1.61 (H) 0.44 - 1.00 mg/dL   Calcium 8.2 (L) 8.9 - 10.3 mg/dL   Total Protein 7.0 6.5 - 8.1 g/dL   Albumin 2.3 (L) 3.5 - 5.0 g/dL   AST 33 15 - 41 U/L    Comment: HEMOLYSIS AT THIS LEVEL MAY AFFECT RESULT   ALT 12 0 - 44 U/L    Comment: HEMOLYSIS AT THIS LEVEL MAY AFFECT RESULT   Alkaline Phosphatase 119 38 - 126 U/L   Total Bilirubin 1.0 <1.2 mg/dL    Comment: HEMOLYSIS AT THIS LEVEL MAY AFFECT RESULT   GFR, Estimated 6 (L) >60 mL/min    Comment: (NOTE) Calculated using the CKD-EPI Creatinine Equation (2021)    Anion gap 18 (H) 5 - 15    Comment: Performed at [facility], [address], [place], [place] [address]  Troponin I (High Sensitivity)     Status: Abnormal   Collection Time: 07/31/23 12:44 AM  Result Value Ref Range   Troponin I (High Sensitivity) 45 (H) <18 ng/L    Comment: (NOTE) Elevated high sensitivity troponin I (hsTnI) values and significant  changes across serial measurements may suggest ACS but many other  chronic and acute conditions are known to elevate hsTnI results.  Refer to the "Links" section for chest pain algorithms and additional  guidance. Performed at [facility], [address], [place], [place] [address]   I-Stat CG4 Lactic Acid     Status: None   Collection Time: 07/31/23 12:53 AM  Result Value Ref Range   Lactic Acid, Venous 0.8 0.5 - 1.9 mmol/L  Urinalysis, w/ Reflex to Culture (Infection Suspected) -Urine, Clean Catch     Status: Abnormal   Collection Time: 07/31/23  2:01 AM  Result Value Ref Range   Specimen Source URINE, CLEAN CATCH    Color, Urine STRAW (A) YELLOW   APPearance CLEAR CLEAR   Specific Gravity, Urine 1.005 1.005 - 1.030   pH 7.0 5.0 - 8.0   Glucose, UA 50 (A) NEGATIVE mg/dL   Hgb urine dipstick NEGATIVE NEGATIVE   Bilirubin Urine NEGATIVE NEGATIVE   Ketones, ur NEGATIVE NEGATIVE mg/dL   Protein, ur 119 (A) NEGATIVE mg/dL   Nitrite NEGATIVE NEGATIVE   Leukocytes,Ua TRACE (A) NEGATIVE   RBC / HPF 0-5 0 - 5 RBC/hpf   WBC, UA 0-5 0 - 5 WBC/hpf    Comment:        Reflex urine culture not performed if WBC <=10, OR if Squamous epithelial cells >5. If Squamous epithelial cells >5 suggest recollection.    Bacteria, UA RARE (A) NONE SEEN   Squamous Epithelial / HPF 0-5 0 - 5 /HPF    Comment: Performed at [facility], [address] [place], [place] [address]  Rapid urine drug screen (hospital performed)     Status: Abnormal   Collection Time: 07/31/23  2:01 AM  Result Value Ref Range   Opiates NONE DETECTED NONE DETECTED   Cocaine POSITIVE (A) NONE DETECTED   Benzodiazepines NONE DETECTED NONE DETECTED   Amphetamines NONE DETECTED NONE DETECTED   Tetrahydrocannabinol NONE DETECTED NONE  DETECTED   Barbiturates NONE DETECTED NONE DETECTED    Comment: (NOTE) DRUG SCREEN FOR MEDICAL PURPOSES ONLY.  IF CONFIRMATION IS NEEDED FOR ANY PURPOSE, NOTIFY LAB WITHIN 5 DAYS.  LOWEST DETECTABLE LIMITS FOR URINE DRUG SCREEN Drug Class                     Cutoff (ng/mL) Amphetamine and metabolites    1000 Barbiturate and metabolites    200 Benzodiazepine                 200 Opiates and metabolites         300 Cocaine and metabolites        300 THC                            50 Performed at [facility], [address], [place], [place] [address]   Troponin I (High Sensitivity)     Status: Abnormal   Collection Time: 07/31/23  2:03 AM  Result Value Ref Range   Troponin I (High Sensitivity) 49 (H) <18 ng/L    Comment: (NOTE) Elevated high sensitivity troponin I (hsTnI) values and significant  changes across serial measurements may suggest ACS but many other  chronic and acute conditions are known to elevate hsTnI results.  Refer to the "Links" section for chest pain algorithms and additional  guidance. Performed at [facility], [address] [place], [place] [address]   Troponin I (High Sensitivity)     Status: Abnormal   Collection Time: 07/31/23  4:45 AM  Result Value Ref Range   Troponin I (High Sensitivity) 48 (H) <18 ng/L    Comment: (NOTE) Elevated high sensitivity troponin I (hsTnI) values and significant  changes across serial measurements may suggest ACS but many other  chronic and acute conditions are known to elevate hsTnI results.  Refer to the "Links" section for chest pain algorithms and additional  guidance. Performed at [facility], [address], [place], [place] [address]    DG Chest 2 View  Result Date: 07/30/2023 CLINICAL DATA:  Chest pain, shortness of breath EXAM: CHEST - 2 VIEW COMPARISON:  03/22/2023 FINDINGS: Lungs are clear.  No pleural effusion or pneumothorax. The heart is top-normal in size. Suspected healed fracture deformities of the in the right posterior 6th and left posterior 7th ribs. Thoracic spine is within normal limits. IMPRESSION: Normal chest radiographs. Electronically Signed   By: Charline Bills M.D.   On: 07/30/2023 22:46    Pending Labs Unresulted Labs (From admission, onward)    None       Vitals/Pain Today's Vitals   07/31/23 0711 07/31/23 0713 07/31/23 0715 07/31/23 0720  BP:   (!) 199/110 (!)  203/112  Pulse:   90 88  Resp:   14 14  Temp:  98.6 F (37 C)    TempSrc:  Oral    SpO2:   100% 100%  Weight:      Height:      PainSc: 10-Worst pain ever       Isolation Precautions No active isolations  Medications Medications  nitroGLYCERIN 50 mg in dextrose 5 % 250 mL (0.2 mg/mL) infusion (160 mcg/min Intravenous Rate/Dose Change 07/31/23 0733)  nitroGLYCERIN (NITROSTAT) SL tablet 0.4 mg (0.4 mg Sublingual Given 07/30/23 2246)  acetaminophen (TYLENOL) tablet 650 mg (650 mg Oral Given 07/31/23 0501)    Mobility walks  Focused Assessments Cardiac Assessment Handoff:  Cardiac Rhythm: Normal sinus rhythm Lab Results  Component Value Date   CKTOTAL 140 04/26/2018   CKMB 1.3 02/23/2008   TROPONINI <0.01        NO INDICATION OF MYOCARDIAL INJURY. 02/23/2008   Lab Results  Component Value Date   DDIMER 0.32 07/03/2011   Does the Patient currently have chest pain? No    R Recommendations: See Admitting Provider Note  Report given to:   Additional Notes:

## 2023-07-31 NOTE — ED Notes (Signed)
Pt had episode with increased HR and reports subjective feeling hot all over and "like my heart is racing" followed by substernal pressure. Pt reports pressure is currently 9/10. Dr. Blinda Leatherwood updated at this time, requested additional EKG which has been performed.

## 2023-07-31 NOTE — H&P (Signed)
History and Physical    Patient: Carol [name] UJW:[id number] DOB: 05-20-1961 DOA: 07/30/2023 DOS: the patient was seen and examined on 07/31/2023 PCP: [facility]  Patient coming from: Home via EMS  Chief Complaint:  Chief Complaint  Patient presents with   Shortness of Breath   Chest Pain   HPI: Carol [name] is a 62 y.o. female with medical history significant of hypertension, diastolic congestive heart failure, COPD, diabetes mellitus type 2, sarcoidosis of the liver, chronic kidney disease stage V, chronic back pain, polysubstance abuse (including cocaine, alcohol, tobacco), and GERD who presents with complaints of shortness of breath and chest pain.  Symptoms started yesterday evening after she has mild cocaine prior.  She reported having pain across her chest with shortness of breath and intermittently productive cough.  Denies having any recent fevers, nausea, vomiting, or diarrhea.   Records note she had been hospitalized back in August 2024 for CHF and at that time her creatinine was noted to be 4.87 and had been sent home on torosemide.  She reports that she was seen by nephrology once in the outpatient setting after that hospitalization, but had not been back to them thereafter.  She reports being on chronic pain medicine due to history of injury to her back several years ago.  Patient has continued to be able to make urine.  In the emergency department patient was noted to be afebrile with respirations elevated to 32, blood pressures elevated up to 252/107, and all other vital signs relatively maintained.  Labs from 12/10 significant for hemoglobin 10.9, CO2 18, BUN 46, creatinine 7.14, anion gap 18, BUN 2.3, lactic acid 0.8, BNP 623.8, high-sensitivity troponins 39->45-> 49.  Chest x-ray noted heart at the upper limit of normal, but was otherwise did not note any acute abnormality.  Urinalysis did not note significant concern for infection.  UDS was positive  for cocaine.  Nephrology have been formally consulted.  Patient having given Tylenol 650 mg p.o., nitroglycerin 0.4 mg sublingual, and noted on a nitroglycerin drip.  Nephrology have been consulted due to patient's worsening kidney function.   Review of Systems: As mentioned in the history of present illness. All other systems reviewed and are negative. 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   Chronic back pain    Depression    Diabetes mellitus    GERD (gastroesophageal reflux disease)    Heart murmur    Hypertension    NSTEMI (non-ST elevated myocardial infarction) (HCC) 12/20/2021   Palpitations    Parkinson's disease (HCC)    Past Surgical History:  Procedure Laterality Date   BALLOON DILATION N/A 11/06/2022   Procedure: BALLOON DILATION;  Surgeon: Napoleon Form, MD;  Location: [facility];  Service: Gastroenterology;  Laterality: N/A;   CARPAL TUNNEL RELEASE     COLONOSCOPY WITH PROPOFOL N/A 11/06/2022   Procedure: COLONOSCOPY WITH PROPOFOL;  Surgeon: Napoleon Form, MD;  Location: [facility];  Service: Gastroenterology;  Laterality: N/A;   ESOPHAGOGASTRODUODENOSCOPY (EGD) WITH PROPOFOL N/A 11/06/2022   Procedure: ESOPHAGOGASTRODUODENOSCOPY (EGD) WITH PROPOFOL;  Surgeon: Napoleon Form, MD;  Location: [facility];  Service: Gastroenterology;  Laterality: N/A;   IR FLUORO GUIDE CV LINE RIGHT  01/26/2022   IR US GUIDE VASC ACCESS RIGHT  01/26/2022   LEFT HEART CATH  AND CORONARY ANGIOGRAPHY N/A 12/20/2021   Procedure: LEFT HEART CATH AND CORONARY ANGIOGRAPHY;  Surgeon: Kathleene Hazel, MD;  Location: [facility];  Service: Cardiovascular;  Laterality: N/A;   OPEN REDUCTION INTERNAL FIXATION (ORIF) FINGER WITH RADIAL BONE GRAFT Right 09/12/2015   Procedure: RIGHT MIDDLE FINGER  CLOSED REDUCTION AND PINNING;  Surgeon: Bradly Bienenstock, MD;  Location: [facility] OR;  Service: Orthopedics;  Laterality: Right;   TONSILLECTOMY     TUBAL LIGATION     Social History:  reports that she has been smoking cigarettes. She has a 0.8 pack-year smoking history. She has been exposed to tobacco smoke. She has never used smokeless tobacco. She reports current alcohol use. She reports current drug use. Drug: Cocaine.  Allergies  Allergen Reactions   Penicillins Shortness Of Breath and Other (See Comments)    Caused yeast infection Has patient had a PCN reaction causing immediate rash, facial/tongue/throat swelling, SOB or lightheadedness with hypotension: Yes Has patient had a PCN reaction causing severe rash involving mucus membranes or skin necrosis: No Has patient had a PCN reaction that required hospitalization pt was in the hospital at the time of the reaction Has patient had a PCN reaction occurring within the last 10 years: No If all of the above answers are "NO", then may proceed with Cephalosp   Ultram [Tramadol] Other (See Comments)    Made her tongue raw    Family History  Problem Relation Age of Onset   Diabetes Mother    Kidney disease Mother    Seizures Father    Hypertension Father    Stroke Father    Asthma Other     Prior to Admission medications   Medication Sig Start Date End Date Taking? Authorizing Provider  albuterol (PROVENTIL HFA;VENTOLIN HFA) 108 (90 Base) MCG/ACT inhaler Inhale 2 puffs into the lungs every 4 (four) hours as needed for wheezing or shortness of breath.   Yes [provider]  amLODipine (NORVASC) 10 MG tablet Take 1 tablet (10 mg total) by mouth daily. 11/01/22  Yes Milford, Anderson Malta, FNP  aspirin EC 81 MG tablet Take 1 tablet (81 mg total) by mouth daily. Swallow whole. 05/11/22  Yes Quincy Simmonds, MD  atorvastatin (LIPITOR) 80 MG tablet Take 1 tablet (80 mg total) by mouth daily. 03/11/23  Yes Azucena Fallen, MD   budesonide-formoterol [facility]) 160-4.5 MCG/ACT inhaler Inhale 2 puffs into the lungs in the morning and at bedtime. 11/09/21  Yes [provider]  calcitRIOL (ROCALTROL) 0.25 MCG capsule Take 0.25 mcg by mouth daily. 07/22/23  Yes [provider]  cetirizine (ZYRTEC) 10 MG tablet Take 10 mg by mouth daily as needed for allergies.   Yes [provider]  eszopiclone 3 MG TABS Take 3 mg by mouth at bedtime. Take immediately before bedtime   Yes [provider]  furosemide (LASIX) 40 MG tablet Take 40 mg by mouth daily. 07/22/23  Yes [provider]  hydrALAZINE (APRESOLINE) 100 MG tablet 100 mg 3 (three) times daily. 07/29/23  Yes [provider]  isosorbide mononitrate (IMDUR) 30 MG 24 hr tablet Take 0.5 tablets (15 mg total) by mouth daily. NEEDS FOLLOW UP APPOINTMENT FOR MORE REFILLS Patient taking differently: Take 15 mg by mouth at bedtime. NEEDS FOLLOW UP APPOINTMENT FOR MORE REFILLS 06/24/23  Yes Laurey Morale, MD  linagliptin (TRADJENTA) 5 MG TABS tablet Take 1 tablet (5 mg total) by mouth daily. Patient taking differently: Take 5 mg by mouth at bedtime.  11/08/22  Yes Ghimire, Werner Lean, MD  melatonin 5 MG TABS Take 1 tablet (5 mg total) by mouth at bedtime as needed (sleep). 11/07/22  Yes Ghimire, Werner Lean, MD  mirtazapine (REMERON) 7.5 MG tablet Take 1 tablet (7.5 mg total) by mouth at bedtime. 11/07/22  Yes Ghimire, Werner Lean, MD  naphazoline-pheniramine (NAPHCON-A) 0.025-0.3 % ophthalmic solution Place 2 drops into both eyes 2 (two) times daily.   Yes [provider]  nitroGLYCERIN (NITROSTAT) 0.4 MG SL tablet Place 1 tablet (0.4 mg total) under the tongue every 5 (five) minutes as needed for chest pain. 05/11/22 11/01/23 Yes Quincy Simmonds, MD  oxyCODONE-acetaminophen (PERCOCET) 10-325 MG tablet Take 1 tablet by mouth every 8 (eight) hours as needed for pain. 07/22/23 08/21/23 Yes [provider]  pantoprazole (PROTONIX) 40  MG tablet Take 1 tablet (40 mg total) by mouth daily at 12 noon. Patient taking differently: Take 40 mg by mouth at bedtime. 05/11/22  Yes Quincy Simmonds, MD  potassium chloride (KLOR-CON M) 10 MEQ tablet Take 1 tablet (10 mEq total) by mouth daily. 04/02/23  Yes Milford, Anderson Malta, FNP  torsemide (DEMADEX) 100 MG tablet Take 100 mg by mouth 2 (two) times daily. 07/22/23  Yes [provider]  triamcinolone ointment (KENALOG) 0.1 % 1 Application 2 (two) times daily as needed. 06/29/23  Yes [provider]  ergocalciferol (VITAMIN D2) 1.25 MG (50000 UT) capsule Take 1 capsule (50,000 Units total) by mouth once a week. Wednesday Patient not taking: Reported on 07/31/2023 05/11/22   Quincy Simmonds, MD  gabapentin (NEURONTIN) 100 MG capsule Take 1 capsule (100 mg total) by mouth 3 (three) times daily. Patient taking differently: Take 100 mg by mouth 2 (two) times daily. 03/29/23 04/28/23  Coralie Keens, MD    Physical Exam: Vitals:   07/31/23 0745 07/31/23 0750 07/31/23 0755 07/31/23 0800  BP: (!) 201/110 (!) 203/113 (!) 200/108 (!) 193/112  Pulse: 93 91 92 91  Resp: 16 16 14 17   Temp:      TempSrc:      SpO2: 100% 100% 100% 100%  Weight:      Height:       Constitutional: Middle-age female who appears to be in no acute rest at this time Eyes: PERRL, lids and conjunctivae normal ENMT: Mucous membranes are moist. Posterior pharynx clear of any exudate or lesions.Normal dentition.  Neck: normal, supple  Respiratory: Mildly decreased overall aeration, but normal respiratory effort without wheezing appreciated. No accessory muscle use.  Cardiovascular: Regular rate and rhythm, positive 2/6 systolic murmur present..  1+ pitting bilateral lower extremity edema.   Abdomen: no tenderness, no masses palpated. No hepatosplenomegaly. Bowel sounds positive.  Musculoskeletal: no clubbing / cyanosis.  Deformity of the back Skin: no rashes, lesions, ulcers. No induration Neurologic: CN  2-12 grossly intact. Sensation intact, DTR normal. Strength 5/5 in all 4.  Psychiatric: Normal judgment and insight. Alert and oriented x 3. Normal mood.   Data Reviewed:  EKG revealed sinus rhythm at 97 bpm with prolonged QT interval 521.  Reviewed labs, imaging, and pertinent records as documented.  Assessment and Plan:  Chest pain Elevated troponin Acute.  Patient present with complaints of chest pain after using cocaine.  Chest x-ray noted heart size at the upper limit of normal, but did not note any acute abnormality.  High-sensitivity troponin 39->45-> 49-> 48.  EKG without significant ischemic changes. Patient with prior cardiac MRI which noted study was more consistent with a small prior RCA  territory infarct and cardiac sarcoidosis in 06/2022. Suspect secondary to demand secondary to cocaine use. -Admit to progressive bed -Follow-up echocardiogram  Hypertensive emergency On admission blood pressures elevated up to 252/107.  Patient had been started on a nitroglycerin drip. -Continue nitroglycerin drip and wean off as able -Continue amlodipine, hydralazine, and Imdur -Hydralazine IV  Acute kidney injury superimposed on chronic kidney disease stage V On admission creatinine noted to be 7.14 with BUN 46.  Baseline creatinine previously had been around 4 when last checked in August of this year.  Nephrology have been formally consulted due to worsening kidney function who had also consulted vascular surgery for access. -Strict I&Os -Avoid possible nephrotoxic agents -Check CK -Continue Calcitrol -Check renal function panel daily -Appreciate nephrology and vascular surgery consultative services  Diastolic congestive heart failure Chronic.  On physical exam patient appears relatively euvolemic besides some mild lower extremity edema.  Chest x-ray did not note any acute abnormality.  BNP 623.8 which appears lower than prior admission in August of this year.  Last EF noted to be 60 to  65% with grade 2 diastolic dysfunction and moderately dilated left atrium when checked on 03/08/2023. -Daily weights -Renal/carb modified diet with fluid restriction  Prolonged QT interval Acute on chronic.  QTc 521 with repeat noted to be 533.  Review of prior EKGs note QT prolonged to a lesser extent back in August.. -Avoid QT prolonging medications -Correct electrolyte abnormalities  Controlled diabetes mellitus type 2, without long-term use of insulin On admission glucose 121.  Last hemoglobin A1c 5.7 on 8//24.  Home medication regimen includes Tradjenta. -Hypoglycemic protocols -Continue Tradjenta  Hyperlipidemia -Continue atorvastatin  Metabolic acidosis with increased anion gap Acute.  On admission CO2 noted to be 18 with anion gap of 18.  Suspect secondary to renal disease. -Continue to monitor  Anemia of chronic disease Hemoglobin 10.9 which appears higher than priors obtained earlier this year. -Continue to monitor  Chronic pain Patient is on chronic opioids due to history of back pain related to injury several years ago -Continue oxycodone as needed  Polysubstance abuse Patient admitted to using cocaine prior to arrival.  UDS positive for cocaine.  Patient does not drink alcohol on a regular basis and only intermittently smokes cigarettes. -Ativan IV as needed for acute intoxication -Transitions of care consulted for substance abuse  DVT prophylaxis: Heparin Advance Care Planning:   Code Status: Full Code   Consults: Nephrology and vascular surgery  Family Communication: Other updated at bedside  Severity of Illness: The appropriate patient status for this patient is INPATIENT. Inpatient status is judged to be reasonable and necessary in order to provide the required intensity of service to ensure the patient's safety. The patient's presenting symptoms, physical exam findings, and initial radiographic and laboratory data in the context of their chronic  comorbidities is felt to place them at high risk for further clinical deterioration. Furthermore, it is not anticipated that the patient will be medically stable for discharge from the hospital within 2 midnights of admission.   * I certify that at the point of admission it is my clinical judgment that the patient will require inpatient hospital care spanning beyond 2 midnights from the point of admission due to high intensity of service, high risk for further deterioration and high frequency of surveillance required.*  Author: Clydie Braun, MD 07/31/2023 8:09 AM  For on call review [email or web address].

## 2023-07-31 NOTE — Progress Notes (Signed)
Heart Failure Navigator Progress Note  Assessed for Heart & Vascular TOC clinic readiness.  Patient does not meet criteria due to Advanced Heart Failure Team patient of Dr. Bensimhon.   Navigator will sign off at this time.   Lucilia Yanni, BSN, RN Heart Failure Nurse Navigator Secure Chat Only   

## 2023-07-31 NOTE — Consult Note (Signed)
[facility] Nephrology Consultation Note  Requesting MD: Dr. Katrinka Blazing Reason for consult: AKI on CKD  HPI:  Carol [name] is a 62 y.o. female with PMH including hypertension, CHF, CKD 5, T2DM, COPD, sarcoidosis of the liver, polysubstance abuse presenting for chest pain and shortness of breath with hypertensive emergency in the setting of recent cocaine use.  Found to have elevated creatinine of 7.14 (most recently 4.87 in 03/2023), consulted for AKI on CKD.  Was recently hospitalized in August 2024 for acute CHF at which time she was sent home with torsemide 60 mg twice daily as well as potassium 20 mEq daily.  In the interim, saw nephrology and was noted to have continued worsening kidney function. She was referred to vascular surgery to create access to initiate dialysis, but she was lost to follow up.  Currently, reports that her chest pain and shortness of breath have improved since being started on nitroglycerin .  States that she has been taking diuretics but is unsure whether she has been taking the Lasix, torsemide, or both and is unsure about her dosing.  She is also unsure what medication she has been taking for blood pressure.  Denies taking NSAIDs.  She is producing urine normally.  Denies hematuria.  She endorses cocaine use about 45 minutes prior to arrival in the ED but otherwise denies other drug use or alcohol use.  Endorses some bilateral lower extremity swelling which has been slightly worsening over the past few days.       Creat  Date/Time Value Ref Range Status  04/03/2023 04:05 PM 4.87 (H) 0.50 - 1.05 mg/dL Final  32/44/0102 72:53 AM 3.54 (H) 0.50 - 1.05 mg/dL Final  66/44/0347 42:59 PM 1.36 (H) 0.50 - 1.05 mg/dL Final    Comment:    For patients >34 years of age, the reference limit for Creatinine is approximately 13% higher for people identified as African-American. .    Creatinine, Ser  Date/Time Value Ref Range Status  07/30/2023 10:36  PM 7.14 (H) 0.44 - 1.00 mg/dL Final  56/38/7564 33:29 AM 4.22 (H) 0.44 - 1.00 mg/dL Final  51/88/4166 06:30 AM 4.14 (H) 0.44 - 1.00 mg/dL Final  16/08/930 35:57 AM 4.10 (H) 0.44 - 1.00 mg/dL Final  32/20/2542 70:62 AM 4.27 (H) 0.44 - 1.00 mg/dL Final  37/62/8315 17:61 AM 4.23 (H) 0.44 - 1.00 mg/dL Final  60/73/7106 26:94 AM 4.32 (H) 0.44 - 1.00 mg/dL Final  85/46/2703 50:09 PM 4.25 (H) 0.44 - 1.00 mg/dL Final  38/18/2993 71:69 AM 4.22 (H) 0.44 - 1.00 mg/dL Final  67/89/3810 17:51 AM 4.00 (H) 0.44 - 1.00 mg/dL Final  02/58/5277 82:42 AM 4.03 (H) 0.44 - 1.00 mg/dL Final  35/36/1443 15:40 AM 3.85 (H) 0.44 - 1.00 mg/dL Final  08/67/6195 09:32 AM 3.36 (H) 0.44 - 1.00 mg/dL Final  67/07/4579 99:83 PM 3.48 (H) 0.44 - 1.00 mg/dL Final  38/25/0539 76:73 PM 3.49 (H) 0.44 - 1.00 mg/dL Final  41/93/7902 40:97 PM 4.61 (H) 0.44 - 1.00 mg/dL Final  35/32/9924 26:83 AM 3.25 (H) 0.44 - 1.00 mg/dL Final  41/96/2229 79:89 AM 3.56 (H) 0.44 - 1.00 mg/dL Final  21/19/4174 08:14 AM 3.67 (H) 0.44 - 1.00 mg/dL Final  48/18/5631 49:70 AM 3.74 (H) 0.44 - 1.00 mg/dL Final  26/37/8588 50:27 AM 3.38 (H) 0.44 - 1.00 mg/dL Final  74/07/8785 76:72 AM 2.76 (H) 0.44 - 1.00 mg/dL Final  09/47/0962 83:66 AM 2.85 (H) 0.44 - 1.00 mg/dL Final  29/47/6546 50:35 AM 2.62 (  H) 0.44 - 1.00 mg/dL Final  16/05/9603 54:09 PM 3.05 (H) 0.44 - 1.00 mg/dL Final  81/19/1478 29:56 AM 3.10 (H) 0.44 - 1.00 mg/dL Final  21/30/8657 84:69 PM 2.96 (H) 0.44 - 1.00 mg/dL Final  62/95/2841 32:44 AM 3.02 (H) 0.44 - 1.00 mg/dL Final  08/22/7251 66:44 AM 3.11 (H) 0.44 - 1.00 mg/dL Final  03/47/4259 56:38 AM 2.32 (H) 0.44 - 1.00 mg/dL Final  75/64/3329 51:88 AM 2.33 (H) 0.44 - 1.00 mg/dL Final  41/66/0630 16:01 PM 3.48 (H) 0.44 - 1.00 mg/dL Final  09/32/3557 32:20 PM 2.05 (H) 0.44 - 1.00 mg/dL Final  25/42/7062 37:62 AM 2.46 (H) 0.44 - 1.00 mg/dL Final  83/15/1761 60:73 AM 2.64 (H) 0.44 - 1.00 mg/dL Final  71/01/2693 85:46 AM 2.97 (H) 0.44 - 1.00  mg/dL Final  27/10/5007 38:18 AM 2.72 (H) 0.44 - 1.00 mg/dL Final  29/93/7169 67:89 AM 2.55 (H) 0.44 - 1.00 mg/dL Final  38/05/1750 02:58 AM 2.67 (H) 0.44 - 1.00 mg/dL Final  52/77/8242 35:36 AM 3.22 (H) 0.44 - 1.00 mg/dL Final  14/43/1540 08:67 AM 2.96 (H) 0.44 - 1.00 mg/dL Final  61/95/0932 67:12 AM 2.66 (H) 0.44 - 1.00 mg/dL Final  45/80/9983 38:25 AM 2.75 (H) 0.44 - 1.00 mg/dL Final  05/39/7673 41:93 AM 2.63 (H) 0.44 - 1.00 mg/dL Final  79/09/4095 35:32 AM 2.50 (H) 0.44 - 1.00 mg/dL Final  99/24/2683 41:96 AM 2.40 (H) 0.44 - 1.00 mg/dL Final  22/29/7989 21:19 AM 2.93 (H) 0.44 - 1.00 mg/dL Final  41/74/0814 48:18 AM 3.54 (H) 0.44 - 1.00 mg/dL Final  56/31/4970 26:37 AM 3.56 (H) 0.44 - 1.00 mg/dL Final  85/88/5027 74:12 AM 3.46 (H) 0.44 - 1.00 mg/dL Final  87/86/7672 09:47 AM 3.96 (H) 0.44 - 1.00 mg/dL Final  09/62/8366 29:47 AM 4.04 (H) 0.44 - 1.00 mg/dL Final     PMHx:   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   Chronic back pain    Depression    Diabetes mellitus    GERD (gastroesophageal reflux disease)    Heart murmur    Hypertension    NSTEMI (non-ST elevated myocardial infarction) (HCC) 12/20/2021   Palpitations    Parkinson's disease (HCC)     Past Surgical History:  Procedure Laterality Date   BALLOON DILATION N/A 11/06/2022   Procedure: BALLOON DILATION;  Surgeon: Napoleon Form, MD;  Location: [facility];  Service: Gastroenterology;  Laterality: N/A;   CARPAL TUNNEL RELEASE     COLONOSCOPY WITH PROPOFOL N/A 11/06/2022   Procedure: COLONOSCOPY WITH PROPOFOL;  Surgeon: Napoleon Form, MD;  Location: [facility];  Service: Gastroenterology;  Laterality: N/A;   ESOPHAGOGASTRODUODENOSCOPY (EGD) WITH PROPOFOL N/A 11/06/2022   Procedure: ESOPHAGOGASTRODUODENOSCOPY (EGD) WITH  PROPOFOL;  Surgeon: Napoleon Form, MD;  Location: [facility];  Service: Gastroenterology;  Laterality: N/A;   IR FLUORO GUIDE CV LINE RIGHT  01/26/2022   IR US GUIDE VASC ACCESS RIGHT  01/26/2022   LEFT HEART CATH AND CORONARY ANGIOGRAPHY N/A 12/20/2021   Procedure: LEFT HEART CATH AND CORONARY ANGIOGRAPHY;  Surgeon: Kathleene Hazel, MD;  Location: [facility];  Service: Cardiovascular;  Laterality: N/A;   OPEN REDUCTION INTERNAL FIXATION (ORIF) FINGER WITH RADIAL BONE GRAFT Right 09/12/2015   Procedure: RIGHT MIDDLE FINGER CLOSED REDUCTION AND PINNING;  Surgeon:  Bradly Bienenstock, MD;  Location: [facility] OR;  Service: Orthopedics;  Laterality: Right;   TONSILLECTOMY     TUBAL LIGATION      Family Hx:  Family History  Problem Relation Age of Onset   Diabetes Mother    Kidney disease Mother    Seizures Father    Hypertension Father    Stroke Father    Asthma Other     Social History:  reports that she has been smoking cigarettes. She has a 0.8 pack-year smoking history. She has been exposed to tobacco smoke. She has never used smokeless tobacco. She reports current alcohol use. She reports current drug use. Drug: Cocaine.  Allergies:  Allergies  Allergen Reactions   Penicillins Shortness Of Breath and Other (See Comments)    Caused yeast infection Has patient had a PCN reaction causing immediate rash, facial/tongue/throat swelling, SOB or lightheadedness with hypotension: Yes Has patient had a PCN reaction causing severe rash involving mucus membranes or skin necrosis: No Has patient had a PCN reaction that required hospitalization pt was in the hospital at the time of the reaction Has patient had a PCN reaction occurring within the last 10 years: No If all of the above answers are "NO", then may proceed with Cephalosp   Ultram [Tramadol] Other (See Comments)    Made her tongue raw    Medications: Prior to Admission medications   Medication Sig Start Date End Date Taking?  Authorizing Provider  albuterol (PROVENTIL HFA;VENTOLIN HFA) 108 (90 Base) MCG/ACT inhaler Inhale 2 puffs into the lungs every 4 (four) hours as needed for wheezing or shortness of breath.   Yes [provider]  amLODipine (NORVASC) 10 MG tablet Take 1 tablet (10 mg total) by mouth daily. 11/01/22  Yes Milford, Anderson Malta, FNP  aspirin EC 81 MG tablet Take 1 tablet (81 mg total) by mouth daily. Swallow whole. 05/11/22  Yes Quincy Simmonds, MD  atorvastatin (LIPITOR) 80 MG tablet Take 1 tablet (80 mg total) by mouth daily. 03/11/23  Yes Azucena Fallen, MD  budesonide-formoterol [facility]) 160-4.5 MCG/ACT inhaler Inhale 2 puffs into the lungs in the morning and at bedtime. 11/09/21  Yes [provider]  calcitRIOL (ROCALTROL) 0.25 MCG capsule Take 0.25 mcg by mouth daily. 07/22/23  Yes [provider]  cetirizine (ZYRTEC) 10 MG tablet Take 10 mg by mouth daily as needed for allergies.   Yes [provider]  eszopiclone 3 MG TABS Take 3 mg by mouth at bedtime. Take immediately before bedtime   Yes [provider]  furosemide (LASIX) 40 MG tablet Take 40 mg by mouth daily. 07/22/23  Yes [provider]  hydrALAZINE (APRESOLINE) 100 MG tablet 100 mg 3 (three) times daily. 07/29/23  Yes [provider]  isosorbide mononitrate (IMDUR) 30 MG 24 hr tablet Take 0.5 tablets (15 mg total) by mouth daily. NEEDS FOLLOW UP APPOINTMENT FOR MORE REFILLS Patient taking differently: Take 15 mg by mouth at bedtime. NEEDS FOLLOW UP APPOINTMENT FOR MORE REFILLS 06/24/23  Yes Laurey Morale, MD  linagliptin (TRADJENTA) 5 MG TABS tablet Take 1 tablet (5 mg total) by mouth daily. Patient taking differently: Take 5 mg by mouth at bedtime. 11/08/22  Yes Ghimire, Werner Lean, MD  melatonin 5 MG TABS Take 1 tablet (5 mg total) by mouth at bedtime as needed (sleep). 11/07/22  Yes Ghimire, Werner Lean, MD  mirtazapine (REMERON) 7.5 MG tablet Take 1 tablet (7.5 mg total) by  mouth at bedtime. 11/07/22  Yes  Ghimire, Werner Lean, MD  naphazoline-pheniramine (NAPHCON-A) 0.025-0.3 % ophthalmic solution Place 2 drops into both eyes 2 (two) times daily.   Yes [provider]  nitroGLYCERIN (NITROSTAT) 0.4 MG SL tablet Place 1 tablet (0.4 mg total) under the tongue every 5 (five) minutes as needed for chest pain. 05/11/22 11/01/23 Yes Quincy Simmonds, MD  oxyCODONE-acetaminophen (PERCOCET) 10-325 MG tablet Take 1 tablet by mouth every 8 (eight) hours as needed for pain. 07/22/23 08/21/23 Yes [provider]  pantoprazole (PROTONIX) 40 MG tablet Take 1 tablet (40 mg total) by mouth daily at 12 noon. Patient taking differently: Take 40 mg by mouth at bedtime. 05/11/22  Yes Quincy Simmonds, MD  potassium chloride (KLOR-CON M) 10 MEQ tablet Take 1 tablet (10 mEq total) by mouth daily. 04/02/23  Yes Milford, Anderson Malta, FNP  torsemide (DEMADEX) 100 MG tablet Take 100 mg by mouth 2 (two) times daily. 07/22/23  Yes [provider]  triamcinolone ointment (KENALOG) 0.1 % 1 Application 2 (two) times daily as needed. 06/29/23  Yes [provider]  ergocalciferol (VITAMIN D2) 1.25 MG (50000 UT) capsule Take 1 capsule (50,000 Units total) by mouth once a week. Wednesday Patient not taking: Reported on 07/31/2023 05/11/22   Quincy Simmonds, MD  gabapentin (NEURONTIN) 100 MG capsule Take 1 capsule (100 mg total) by mouth 3 (three) times daily. Patient taking differently: Take 100 mg by mouth 2 (two) times daily. 03/29/23 04/28/23  Arrien, York Ram, MD    I have reviewed the patient's current medications.  Labs:  Results for orders placed or performed during the hospital encounter of 07/30/23 (from the past 48 hour(s))  Troponin I (High Sensitivity)     Status: Abnormal   Collection Time: 07/30/23 10:36 PM  Result Value Ref Range   Troponin I (High Sensitivity) 39 (H) <18 ng/L    Comment: (NOTE) Elevated high sensitivity troponin I (hsTnI) values and significant   changes across serial measurements may suggest ACS but many other  chronic and acute conditions are known to elevate hsTnI results.  Refer to the "Links" section for chest pain algorithms and additional  guidance. Performed at [facility], [address], [place], [place] [address]   Brain natriuretic peptide     Status: Abnormal   Collection Time: 07/30/23 10:36 PM  Result Value Ref Range   B Natriuretic Peptide 623.8 (H) 0.0 - 100.0 pg/mL    Comment: Performed at [facility], [address] [place], [place] [address]  CBC with Differential     Status: Abnormal   Collection Time: 07/30/23 10:36 PM  Result Value Ref Range   WBC 5.7 4.0 - 10.5 K/uL   RBC 3.83 (L) 3.87 - 5.11 MIL/uL   Hemoglobin 10.9 (L) 12.0 - 15.0 g/dL   HCT 29.5 (L) 28.4 - 13.2 %   MCV 85.4 80.0 - 100.0 fL   MCH 28.5 26.0 - 34.0 pg   MCHC 33.3 30.0 - 36.0 g/dL   RDW 44.0 10.2 - 72.5 %   Platelets 241 150 - 400 K/uL   nRBC 0.0 0.0 - 0.2 %   Neutrophils Relative % 62 %   Neutro Abs 3.6 1.7 - 7.7 K/uL   Lymphocytes Relative 18 %   Lymphs Abs 1.0 0.7 - 4.0 K/uL   Monocytes Relative 8 %   Monocytes Absolute 0.4 0.1 - 1.0 K/uL   Eosinophils Relative 10 %   Eosinophils Absolute 0.6 (H) 0.0 - 0.5 K/uL   Basophils  Relative 1 %   Basophils Absolute 0.0 0.0 - 0.1 K/uL   Immature Granulocytes 1 %   Abs Immature Granulocytes 0.03 0.00 - 0.07 K/uL    Comment: Performed at [facility], [address], [place], [place] [address]  Comprehensive metabolic panel     Status: Abnormal   Collection Time: 07/30/23 10:36 PM  Result Value Ref Range   Sodium 138 135 - 145 mmol/L   Potassium 4.1 3.5 - 5.1 mmol/L    Comment: HEMOLYSIS AT THIS LEVEL MAY AFFECT RESULT   Chloride 102 98 - 111 mmol/L   CO2 18 (L) 22 - 32 mmol/L   Glucose, Bld 121 (H) 70 - 99 mg/dL    Comment: Glucose reference range applies only to samples taken after fasting for at least 8 hours.   BUN 46 (H) 6 - 20 mg/dL   Creatinine, Ser  2.95 (H) 0.44 - 1.00 mg/dL   Calcium 8.2 (L) 8.9 - 10.3 mg/dL   Total Protein 7.0 6.5 - 8.1 g/dL   Albumin 2.3 (L) 3.5 - 5.0 g/dL   AST 33 15 - 41 U/L    Comment: HEMOLYSIS AT THIS LEVEL MAY AFFECT RESULT   ALT 12 0 - 44 U/L    Comment: HEMOLYSIS AT THIS LEVEL MAY AFFECT RESULT   Alkaline Phosphatase 119 38 - 126 U/L   Total Bilirubin 1.0 <1.2 mg/dL    Comment: HEMOLYSIS AT THIS LEVEL MAY AFFECT RESULT   GFR, Estimated 6 (L) >60 mL/min    Comment: (NOTE) Calculated using the CKD-EPI Creatinine Equation (2021)    Anion gap 18 (H) 5 - 15    Comment: Performed at [facility], [address], [place], [place] [address]  Troponin I (High Sensitivity)     Status: Abnormal   Collection Time: 07/31/23 12:44 AM  Result Value Ref Range   Troponin I (High Sensitivity) 45 (H) <18 ng/L    Comment: (NOTE) Elevated high sensitivity troponin I (hsTnI) values and significant  changes across serial measurements may suggest ACS but many other  chronic and acute conditions are known to elevate hsTnI results.  Refer to the "Links" section for chest pain algorithms and additional  guidance. Performed at [facility], [address], [place], [place] [address]   I-Stat CG4 Lactic Acid     Status: None   Collection Time: 07/31/23 12:53 AM  Result Value Ref Range   Lactic Acid, Venous 0.8 0.5 - 1.9 mmol/L  Urinalysis, w/ Reflex to Culture (Infection Suspected) -Urine, Clean Catch     Status: Abnormal   Collection Time: 07/31/23  2:01 AM  Result Value Ref Range   Specimen Source URINE, CLEAN CATCH    Color, Urine STRAW (A) YELLOW   APPearance CLEAR CLEAR   Specific Gravity, Urine 1.005 1.005 - 1.030   pH 7.0 5.0 - 8.0   Glucose, UA 50 (A) NEGATIVE mg/dL   Hgb urine dipstick NEGATIVE NEGATIVE   Bilirubin Urine NEGATIVE NEGATIVE   Ketones, ur NEGATIVE NEGATIVE mg/dL   Protein, ur 027 (A) NEGATIVE mg/dL   Nitrite NEGATIVE NEGATIVE   Leukocytes,Ua TRACE (A) NEGATIVE   RBC / HPF 0-5 0  - 5 RBC/hpf   WBC, UA 0-5 0 - 5 WBC/hpf    Comment:        Reflex urine culture not performed if WBC <=10, OR if Squamous epithelial cells >5. If Squamous epithelial cells >5 suggest recollection.    Bacteria, UA RARE (  A) NONE SEEN   Squamous Epithelial / HPF 0-5 0 - 5 /HPF    Comment: Performed at [facility], [address] [place], [place] [address]  Rapid urine drug screen (hospital performed)     Status: Abnormal   Collection Time: 07/31/23  2:01 AM  Result Value Ref Range   Opiates NONE DETECTED NONE DETECTED   Cocaine POSITIVE (A) NONE DETECTED   Benzodiazepines NONE DETECTED NONE DETECTED   Amphetamines NONE DETECTED NONE DETECTED   Tetrahydrocannabinol NONE DETECTED NONE DETECTED   Barbiturates NONE DETECTED NONE DETECTED    Comment: (NOTE) DRUG SCREEN FOR MEDICAL PURPOSES ONLY.  IF CONFIRMATION IS NEEDED FOR ANY PURPOSE, NOTIFY LAB WITHIN 5 DAYS.  LOWEST DETECTABLE LIMITS FOR URINE DRUG SCREEN Drug Class                     Cutoff (ng/mL) Amphetamine and metabolites    1000 Barbiturate and metabolites    200 Benzodiazepine                 200 Opiates and metabolites        300 Cocaine and metabolites        300 THC                            50 Performed at [facility], [address], [place], [place] [address]   Troponin I (High Sensitivity)     Status: Abnormal   Collection Time: 07/31/23  2:03 AM  Result Value Ref Range   Troponin I (High Sensitivity) 49 (H) <18 ng/L    Comment: (NOTE) Elevated high sensitivity troponin I (hsTnI) values and significant  changes across serial measurements may suggest ACS but many other  chronic and acute conditions are known to elevate hsTnI results.  Refer to the "Links" section for chest pain algorithms and additional  guidance. Performed at [facility], [address], [place], [place] [address]   Troponin I (High Sensitivity)     Status: Abnormal   Collection Time: 07/31/23  4:45 AM  Result  Value Ref Range   Troponin I (High Sensitivity) 48 (H) <18 ng/L    Comment: (NOTE) Elevated high sensitivity troponin I (hsTnI) values and significant  changes across serial measurements may suggest ACS but many other  chronic and acute conditions are known to elevate hsTnI results.  Refer to the "Links" section for chest pain algorithms and additional  guidance. Performed at [facility], [address] [place], [place] [address]      ROS:  Pertinent items are noted in HPI.  Physical Exam: Vitals:   07/31/23 0755 07/31/23 0800  BP: (!) 200/108 (!) 193/112  Pulse: 92 91  Resp: 14 17  Temp:    SpO2: 100% 100%      General exam: Appears calm and comfortable  Respiratory system: Clear to auscultation. Respiratory effort normal. No wheezing or crackle Cardiovascular system: S1 & S2 heard, RRR.  1+ bilateral lower extremity pitting edema to mid calf Gastrointestinal system: Abdomen is nondistended, soft and nontender.  Central nervous system: Alert and oriented. No focal neurological deficits. Skin: No obvious rashes, lesions or ulcers  Assessment/Plan: AKI on CKD 5, vs more likely progression to ESRD:  Cr elevated to 7.14 from previous 4.87 in 08/24, though has been progressively worsening (Cr of 2.85 in March 2024). She does have anion gap metabolic acidosis  though this is not profound.  Lactate normal. Albumin 2.3. Current presentation in the setting of hypertensive emergency, with cocaine use and diuretic use possibly contributing. During last admission, noted to have nephrotic range proteinuria but negative SPEP and free light chains. Was not started on dialysis during that admission. Renal US in March showed renal cortical echogenicity consistent with medical renal disease.  Currently, she is relatively euvolemic aside from mild lower extremity edema. No uremic symptoms or significant electrolyte abnormalities. However, given her social situation and likelihood that  she may not follow up outpatient, feel that it is worth initiating dialysis during her current admission due to likely progression to ESRD. --Check PTH, RFP, iron studies --Strict I/O --Consult VVS for access  2. Hypertensive emergency -On nitroglycerin gtt, amlodipine, hydralazine, and imdur -CXR unremarkable  3. Normocytic Anemia  -Hgb 10.9, up from 8.9 in August -Likely in the setting of CKD -Received feraheme last admission, was on ESA -Checking iron studies  Remainder per primary  Shauntavia Brackin 07/31/2023, 8:26 AM  FM PGY-2 [facility].

## 2023-07-31 NOTE — Consult Note (Addendum)
Hospital Consult    Reason for Consult:  [facility] and permanent dialysis access Requesting Physician:  Dr. Valentino Nose MRN #:  [id number]  History of Present Illness: This is a 62 y.o. female with past medical history of HTN, HLD, CHF, GERD, Type II DM, COPD, Depression, Chronic pain, Polysubstance abuse and AKI who presented to [facility] ED with shortness of breath and chest pain after using cocaine. 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 She has seen Nephrology during past admissions with worsening CKD. She has not been on dialysis before. No prior central lines, PICC line, ports or pacemaker. She has no upper extremity limitations. She has had carpal tunnel surgery before bilaterally. She is right hand dominant. Vascular surgery was consulted for evaluation of TDC placement and permanent dialysis access.   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   Chronic back pain    Depression    Diabetes mellitus    GERD (gastroesophageal reflux disease)    Heart murmur    Hypertension    NSTEMI (non-ST elevated myocardial infarction) (HCC) 12/20/2021   Palpitations    Parkinson's disease (HCC)     Past Surgical History:  Procedure Laterality Date   BALLOON DILATION N/A 11/06/2022   Procedure: BALLOON DILATION;  Surgeon: Napoleon Form, MD;  Location: [facility] ENDOSCOPY;  Service: Gastroenterology;  Laterality: N/A;   CARPAL TUNNEL RELEASE     COLONOSCOPY WITH PROPOFOL N/A 11/06/2022   Procedure: COLONOSCOPY WITH PROPOFOL;  Surgeon: Napoleon Form, MD;  Location: [facility] ENDOSCOPY;  Service: Gastroenterology;  Laterality: N/A;   ESOPHAGOGASTRODUODENOSCOPY (EGD) WITH PROPOFOL N/A 11/06/2022   Procedure: ESOPHAGOGASTRODUODENOSCOPY (EGD) WITH PROPOFOL;  Surgeon: Napoleon Form, MD;  Location: [facility]  ENDOSCOPY;  Service: Gastroenterology;  Laterality: N/A;   IR FLUORO GUIDE CV LINE RIGHT  01/26/2022   IR US GUIDE VASC ACCESS RIGHT  01/26/2022   LEFT HEART CATH AND CORONARY ANGIOGRAPHY N/A 12/20/2021   Procedure: LEFT HEART CATH AND CORONARY ANGIOGRAPHY;  Surgeon: Kathleene Hazel, MD;  Location: [facility] INVASIVE CV LAB;  Service: Cardiovascular;  Laterality: N/A;   OPEN REDUCTION INTERNAL FIXATION (ORIF) FINGER WITH RADIAL BONE GRAFT Right 09/12/2015   Procedure: RIGHT MIDDLE FINGER CLOSED REDUCTION AND PINNING;  Surgeon: Bradly Bienenstock, MD;  Location: [facility] OR;  Service: Orthopedics;  Laterality: Right;   TONSILLECTOMY     TUBAL LIGATION      Allergies  Allergen Reactions   Penicillins Shortness Of Breath and Other (See Comments)    Caused yeast infection Has patient had a PCN reaction causing immediate rash, facial/tongue/throat swelling, SOB or lightheadedness with hypotension: Yes Has patient had a PCN reaction causing severe rash involving mucus membranes or skin necrosis: No Has patient had a PCN reaction that required hospitalization pt was in the hospital at the time of the reaction Has patient had a PCN reaction occurring within the last 10 years: No If all of the above answers are "NO", then may proceed with Cephalosp   Ultram [Tramadol] Other (See Comments)    Made her tongue raw    Prior to Admission medications   Medication Sig Start Date End Date Taking? Authorizing Provider  albuterol (PROVENTIL HFA;VENTOLIN HFA) 108 (90 Base) MCG/ACT inhaler Inhale 2 puffs into the lungs every 4 (four) hours as  needed for wheezing or shortness of breath.   Yes [provider]  amLODipine (NORVASC) 10 MG tablet Take 1 tablet (10 mg total) by mouth daily. 11/01/22  Yes Milford, Anderson Malta, FNP  aspirin EC 81 MG tablet Take 1 tablet (81 mg total) by mouth daily. Swallow whole. 05/11/22  Yes Quincy Simmonds, MD  atorvastatin (LIPITOR) 80 MG tablet Take 1 tablet (80 mg total) by mouth daily.  03/11/23  Yes Azucena Fallen, MD  budesonide-formoterol [facility]) 160-4.5 MCG/ACT inhaler Inhale 2 puffs into the lungs in the morning and at bedtime. 11/09/21  Yes [provider]  calcitRIOL (ROCALTROL) 0.25 MCG capsule Take 0.25 mcg by mouth daily. 07/22/23  Yes [provider]  cetirizine (ZYRTEC) 10 MG tablet Take 10 mg by mouth daily as needed for allergies.   Yes [provider]  eszopiclone 3 MG TABS Take 3 mg by mouth at bedtime. Take immediately before bedtime   Yes [provider]  furosemide (LASIX) 40 MG tablet Take 40 mg by mouth daily. 07/22/23  Yes [provider]  hydrALAZINE (APRESOLINE) 100 MG tablet 100 mg 3 (three) times daily. 07/29/23  Yes [provider]  isosorbide mononitrate (IMDUR) 30 MG 24 hr tablet Take 0.5 tablets (15 mg total) by mouth daily. NEEDS FOLLOW UP APPOINTMENT FOR MORE REFILLS Patient taking differently: Take 15 mg by mouth at bedtime. NEEDS FOLLOW UP APPOINTMENT FOR MORE REFILLS 06/24/23  Yes Laurey Morale, MD  linagliptin (TRADJENTA) 5 MG TABS tablet Take 1 tablet (5 mg total) by mouth daily. Patient taking differently: Take 5 mg by mouth at bedtime. 11/08/22  Yes Ghimire, Werner Lean, MD  melatonin 5 MG TABS Take 1 tablet (5 mg total) by mouth at bedtime as needed (sleep). 11/07/22  Yes Ghimire, Werner Lean, MD  mirtazapine (REMERON) 7.5 MG tablet Take 1 tablet (7.5 mg total) by mouth at bedtime. 11/07/22  Yes Ghimire, Werner Lean, MD  naphazoline-pheniramine (NAPHCON-A) 0.025-0.3 % ophthalmic solution Place 2 drops into both eyes 2 (two) times daily.   Yes [provider]  nitroGLYCERIN (NITROSTAT) 0.4 MG SL tablet Place 1 tablet (0.4 mg total) under the tongue every 5 (five) minutes as needed for chest pain. 05/11/22 11/01/23 Yes Quincy Simmonds, MD  oxyCODONE-acetaminophen (PERCOCET) 10-325 MG tablet Take 1 tablet by mouth every 8 (eight) hours as needed for pain. 07/22/23 08/21/23 Yes [provider]  pantoprazole (PROTONIX) 40 MG tablet Take 1 tablet (40 mg total) by mouth daily at 12 noon. Patient taking differently: Take 40 mg by mouth at bedtime. 05/11/22  Yes Quincy Simmonds, MD  potassium chloride (KLOR-CON M) 10 MEQ tablet Take 1 tablet (10 mEq total) by mouth daily. 04/02/23  Yes Milford, Anderson Malta, FNP  torsemide (DEMADEX) 100 MG tablet Take 100 mg by mouth 2 (two) times daily. 07/22/23  Yes [provider]  triamcinolone ointment (KENALOG) 0.1 % 1 Application 2 (two) times daily as needed. 06/29/23  Yes [provider]  ergocalciferol (VITAMIN D2) 1.25 MG (50000 UT) capsule Take 1 capsule (50,000 Units total) by mouth once a week. Wednesday Patient not taking: Reported on 07/31/2023 05/11/22   Quincy Simmonds, MD  gabapentin (NEURONTIN) 100 MG capsule Take 1 capsule (100 mg total) by mouth 3 (three) times daily. Patient taking differently: Take 100 mg by mouth 2 (two) times daily. 03/29/23 04/28/23  Arrien, York Ram, MD    Social History   Socioeconomic History   Marital status: Single    Spouse  name: Not on file   Number of children: 3   Years of education: Not on file   Highest education level: Not on file  Occupational History   Not on file  Tobacco Use   Smoking status: Some Days    Current packs/day: 0.25    Average packs/day: 0.3 packs/day for 3.0 years (0.8 ttl pk-yrs)    Types: Cigarettes    Passive exposure: Current   Smokeless tobacco: Never  Vaping Use   Vaping status: Never Used  Substance and Sexual Activity   Alcohol use: Yes    Alcohol/week: 0.0 standard drinks of alcohol    Comment: occasional   Drug use: Yes    Types: Cocaine   Sexual activity: Not Currently  Other Topics Concern   Not on file  Social History Narrative   Not on file   Social Determinants of Health   Financial Resource Strain: Low Risk  (04/04/2022)   Overall Financial Resource Strain (CARDIA)    Difficulty of Paying Living Expenses: Not very  hard  Food Insecurity: No Food Insecurity (03/22/2023)   Hunger Vital Sign    Worried About Running Out of Food in the Last Year: Never true    Ran Out of Food in the Last Year: Never true  Transportation Needs: No Transportation Needs (03/22/2023)   PRAPARE - Administrator, Civil Service (Medical): No    Lack of Transportation (Non-Medical): No  Physical Activity: Not on file  Stress: Not on file  Social Connections: Not on file  Intimate Partner Violence: Not At Risk (03/22/2023)   Humiliation, Afraid, Rape, and Kick questionnaire    Fear of Current or Ex-Partner: No    Emotionally Abused: No    Physically Abused: No    Sexually Abused: No     Family History  Problem Relation Age of Onset   Diabetes Mother    Kidney disease Mother    Seizures Father    Hypertension Father    Stroke Father    Asthma Other     ROS: Otherwise negative unless mentioned in HPI  Physical Examination  Vitals:   07/31/23 1035 07/31/23 1115  BP:  (!) 165/86  Pulse:  (!) 107  Resp:  (!) 28  Temp: 98.8 F (37.1 C)   SpO2:  100%   Body mass index is 29.18 kg/m.  General:  WDWN in NAD Gait: Not observed HENT: WNL, normocephalic Pulmonary: normal non-labored breathing, without wheezing Cardiac: regular Abdomen:  soft Vascular Exam/Pulses: 2+ radial pulses bilaterally, hands warm and well perfused. 5/5 grip strength Extremities: without ischemic changes, without Gangrene , without cellulitis; without open wounds;  Musculoskeletal: no muscle wasting or atrophy  Neurologic: A&O X 3;  No focal weakness or paresthesias are detected; speech is fluent/normal Psychiatric:  The pt has Normal affect.  CBC    Component Value Date/Time   WBC 5.7 07/30/2023 2236   RBC 3.83 (L) 07/30/2023 2236   HGB 10.9 (L) 07/30/2023 2236   HCT 32.7 (L) 07/30/2023 2236   PLT 241 07/30/2023 2236   MCV 85.4 07/30/2023 2236   MCH 28.5 07/30/2023 2236   MCHC 33.3 07/30/2023 2236   RDW 15.5 07/30/2023  2236   LYMPHSABS 1.0 07/30/2023 2236   MONOABS 0.4 07/30/2023 2236   EOSABS 0.6 (H) 07/30/2023 2236   BASOSABS 0.0 07/30/2023 2236    BMET    Component Value Date/Time   NA 138 07/30/2023 2236   K 4.1 07/30/2023 2236   CL  102 07/30/2023 2236   CO2 18 (L) 07/30/2023 2236   GLUCOSE 121 (H) 07/30/2023 2236   BUN 46 (H) 07/30/2023 2236   CREATININE 7.14 (H) 07/30/2023 2236   CREATININE 4.87 (H) 04/03/2023 1605   CALCIUM 8.2 (L) 07/30/2023 2236   GFRNONAA 6 (L) 07/30/2023 2236   GFRNONAA 43 (L) 11/21/2020 1451   GFRAA 50 (L) 11/21/2020 1451    COAGS: Lab Results  Component Value Date   INR 1.1 03/22/2023   INR 1.0 01/10/2022   INR 0.9 12/20/2021     Non-Invasive Vascular Imaging:   Bilateral upper extremity vein mapping pending  Statin:  Yes.   Beta Blocker:  No. Aspirin:  Yes.   ACEI:  No. ARB:  No. CCB use:  Yes Other antiplatelets/anticoagulants:  No.    ASSESSMENT/PLAN: This is a 62 y.o. female  past medical history of HTN, HLD, CHF, GERD, Type II DM, COPD, Depression, Chronic pain, Polysubstance abuse and AKI who presented to [facility] ED with shortness of breath and chest pain after using cocaine. 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  She is in need of a tunneled dialysis catheter and permanent dialysis access. Her bilateral upper extremity vein mapping is pending. She is right hand dominant so hopefully she will have adequate venous conduit for LUE access. I discussed AVF vs AVG with her and Risk, benefits, and alternatives to access surgery were discussed. The patient is aware the risks include but are not limited to: bleeding, infection, steal syndrome, nerve damage, thrombosis, failure to mature, and need for additional procedures. She has eaten already today and needs to be medically optimized prior to access surgery. There is time in OR tomorrow for [facility] placement and left AV fistula vs Graft. Patient seems very apprehensive about  access in her arms so if she is agreeable we could proceed tomorrow. She will need to be NPO after midnight. The on call vascular surgeon, Dr. Myra Gianotti will see her later today and provide further management plans   Graceann Congress PA-C [facility] [phone] 07/31/2023  11:31 AM  I agree with the above.  I have seen and evaluated the patient.  She is a new start dialysis access.  She will be scheduled for a tunneled dialysis catheter and left arm access.  Her vein mapping has just been completed.  She does not appear to have an adequate vein.  Decision for graft versus fistula in the left arm will be made during the operation.  Patient is a little reluctant to proceed but understands that this is in her best interest.  All questions were answered.  She will be n.p.o. after midnight.  Wells Harue Pribble   -----------------+-------------+----------+---------+  Right Cephalic   Diameter (cm)Depth (cm)Findings   +-----------------+-------------+----------+---------+  Shoulder            0.29        0.80              +-----------------+-------------+----------+---------+  Prox upper arm       0.17        0.70              +-----------------+-------------+----------+---------+  Mid upper arm        0.22        0.27              +-----------------+-------------+----------+---------+  Dist upper arm       0.21  0.42              +-----------------+-------------+----------+---------+  Antecubital fossa    0.43        0.23   branching  +-----------------+-------------+----------+---------+  Prox forearm         0.23        0.52   branching  +-----------------+-------------+----------+---------+  Mid forearm          0.17        0.24   Thrombus   +-----------------+-------------+----------+---------+  Dist forearm         0.17        0.15              +-----------------+-------------+----------+---------+    +-----------------+-------------+----------+---------+  Right Basilic    Diameter (cm)Depth (cm)Findings   +-----------------+-------------+----------+---------+  Shoulder            0.18        1.00              +-----------------+-------------+----------+---------+  Dist upper arm       0.13        0.98              +-----------------+-------------+----------+---------+  Antecubital fossa    0.16        0.18   branching  +-----------------+-------------+----------+---------+  Prox forearm         0.08        0.13              +-----------------+-------------+----------+---------+  Mid forearm          0.13        0.14              +-----------------+-------------+----------+---------+  Distal forearm       0.08        0.14              +-----------------+-------------+----------+---------+   +-----------------+-------------+----------+---------+  Left Cephalic    Diameter (cm)Depth (cm)Findings   +-----------------+-------------+----------+---------+  Shoulder            0.22        0.96              +-----------------+-------------+----------+---------+  Prox upper arm       0.18        0.52   branching  +-----------------+-------------+----------+---------+  Mid upper arm        0.16        0.48              +-----------------+-------------+----------+---------+  Dist upper arm       0.15        0.43              +-----------------+-------------+----------+---------+  Antecubital fossa    0.15        0.61   branching  +-----------------+-------------+----------+---------+  Prox forearm         0.17        0.16      IV      +-----------------+-------------+----------+---------+  Mid forearm          0.26        0.16              +-----------------+-------------+----------+---------+  Dist forearm         0.25        0.25              +-----------------+-------------+----------+---------+    +-----------------+-------------+----------+---------+  Left Basilic  Diameter (cm)Depth (cm)Findings   +-----------------+-------------+----------+---------+  Shoulder            0.21        2.10              +-----------------+-------------+----------+---------+  Mid upper arm        0.23        1.50              +-----------------+-------------+----------+---------+  Dist upper arm       0.24        0.84   branching  +-----------------+-------------+----------+---------+  Antecubital fossa    0.14        0.74   branching  +-----------------+-------------+----------+---------+  Prox forearm         0.13        0.14              +-----------------+-------------+----------+---------+  Mid forearm          0.14        0.17              +-----------------+-------------+----------+---------+  Distal forearm       0.12        0.20              +-----------------+-------------+----------+---------

## 2023-07-31 NOTE — ED Notes (Signed)
Dr.Smith notified of pt bp of 192/107 at this time. Nitroglycerin drip is maxed out at 250mcg/min at this time. Pt states she takes bp medicines but has been noncompliant with meds. Will continue to monitor.

## 2023-07-31 NOTE — ED Notes (Signed)
ED TO INPATIENT HANDOFF REPORT  ED Nurse Name and Phone #: [phone]  S Name/Age/Gender Carol [name] 62 y.o. female Room/Bed: 010C/010C  Code Status   Code Status: Full Code  Home/SNF/Other Home Patient oriented to: self, place, time, and situation Is this baseline? Yes   Triage Complete: Triage complete  Chief Complaint Chest pain [R07.9]  Triage Note Pt arrives via [facility] from home for c/o shortness of breath that began approx. 1 hour pta tonight immediately after she smoked some crack. EMS states that as they were backing in pt also expressed she was having some chest pain, and pt was given 324mg  ASA at that time. NAD noted at this time. Achille Rich, PA-C at bedside during triage.   Pt has history of MI, CHF, ESRD, HTN but is not completely sure if she takes all of the right medications daily because she gets them all confused per pt.     Allergies Allergies  Allergen Reactions   Penicillins Shortness Of Breath and Other (See Comments)    Caused yeast infection Has patient had a PCN reaction causing immediate rash, facial/tongue/throat swelling, SOB or lightheadedness with hypotension: Yes Has patient had a PCN reaction causing severe rash involving mucus membranes or skin necrosis: No Has patient had a PCN reaction that required hospitalization pt was in the hospital at the time of the reaction Has patient had a PCN reaction occurring within the last 10 years: No If all of the above answers are "NO", then may proceed with Cephalosp   Ultram [Tramadol] Other (See Comments)    Made her tongue raw    Level of Care/Admitting Diagnosis ED Disposition     ED Disposition  Admit   Condition  --   Comment  Hospital [facility]: [facility] [[id number]]  Level of Care: Progressive [102]  Admit to Progressive based on following criteria: CARDIOVASCULAR & THORACIC of moderate stability with acute coronary syndrome symptoms/low risk myocardial  infarction/hypertensive urgency/arrhythmias/heart failure potentially compromising stability and stable post cardiovascular intervention patients.  May admit patient to [facility] or [facility] if equivalent level of care is available:: No  Covid Evaluation: Asymptomatic - no recent exposure (last 10 days) testing not required  Diagnosis: Chest pain [102725]  Admitting Physician: Clydie Braun [[id number]]  Attending Physician: Clydie Braun [[id number]]  Certification:: I certify this patient will need inpatient services for at least 2 midnights          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   Chronic back pain    Depression    Diabetes mellitus    GERD (gastroesophageal reflux disease)    Heart murmur    Hypertension    NSTEMI (non-ST elevated myocardial infarction) (HCC) 12/20/2021   Palpitations    Parkinson's disease (HCC)    Past Surgical History:  Procedure Laterality Date   BALLOON DILATION N/A 11/06/2022   Procedure: BALLOON DILATION;  Surgeon: Napoleon Form, MD;  Location: [facility] ENDOS[facility];  Service: Gastroenterology;  Laterality: N/A;   CARPAL TUNNEL RELEASE     COLONOSCOPY WITH PROPOFOL N/A 11/06/2022   Procedure: COLONOSCOPY WITH PROPOFOL;  Surgeon: Napoleon Form, MD;  Location: [facility] ENDOS[facility];  Service: Gastroenterology;  Laterality: N/A;   ESOPHAGOGASTRODUODENOSCOPY (EGD) WITH PROPOFOL N/A 11/06/2022   Procedure: ESOPHAGOGASTRODUODENOSCOPY (  EGD) WITH PROPOFOL;  Surgeon: Napoleon Form, MD;  Location: [facility] ENDOSCOPY;  Service: Gastroenterology;  Laterality: N/A;   IR FLUORO GUIDE CV LINE RIGHT  01/26/2022   IR US GUIDE VASC ACCESS RIGHT  01/26/2022   LEFT HEART CATH AND CORONARY ANGIOGRAPHY N/A 12/20/2021   Procedure: LEFT HEART CATH AND CORONARY  ANGIOGRAPHY;  Surgeon: Kathleene Hazel, MD;  Location: [facility] INVASIVE CV LAB;  Service: Cardiovascular;  Laterality: N/A;   OPEN REDUCTION INTERNAL FIXATION (ORIF) FINGER WITH RADIAL BONE GRAFT Right 09/12/2015   Procedure: RIGHT MIDDLE FINGER CLOSED REDUCTION AND PINNING;  Surgeon: Bradly Bienenstock, MD;  Location: [facility] OR;  Service: Orthopedics;  Laterality: Right;   TONSILLECTOMY     TUBAL LIGATION       A IV Location/Drains/Wounds Patient Lines/Drains/Airways Status     Active Line/Drains/Airways     Name Placement date Placement time Site Days   Peripheral IV 07/30/23 20 G Left Antecubital 07/30/23  2130  Antecubital  1   Peripheral IV 07/30/23 20 G Anterior;Left Forearm 07/30/23  2230  Forearm  1   External Urinary Catheter 07/31/23  0010  --  less than 1            Intake/Output Last 24 hours  Intake/Output Summary (Last 24 hours) at 07/31/2023 1514 Last data filed at 07/31/2023 0865 Gross per 24 hour  Intake 131.29 ml  Output --  Net 131.29 ml    Labs/Imaging Results for orders placed or performed during the hospital encounter of 07/30/23 (from the past 48 hour(s))  Troponin I (High Sensitivity)     Status: Abnormal   Collection Time: 07/30/23 10:36 PM  Result Value Ref Range   Troponin I (High Sensitivity) 39 (H) <18 ng/L    Comment: (NOTE) Elevated high sensitivity troponin I (hsTnI) values and significant  changes across serial measurements may suggest ACS but many other  chronic and acute conditions are known to elevate hsTnI results.  Refer to the "Links" section for chest pain algorithms and additional  guidance. Performed at [facility], [address] [place], [place] [address]   Brain natriuretic peptide     Status: Abnormal   Collection Time: 07/30/23 10:36 PM  Result Value Ref Range   B Natriuretic Peptide 623.8 (H) 0.0 - 100.0 pg/mL    Comment: Performed at [facility], [address], [place], [place] [address]  CBC with  Differential     Status: Abnormal   Collection Time: 07/30/23 10:36 PM  Result Value Ref Range   WBC 5.7 4.0 - 10.5 K/uL   RBC 3.83 (L) 3.87 - 5.11 MIL/uL   Hemoglobin 10.9 (L) 12.0 - 15.0 g/dL   HCT 84.1 (L) 32.4 - 40.1 %   MCV 85.4 80.0 - 100.0 fL   MCH 28.5 26.0 - 34.0 pg   MCHC 33.3 30.0 - 36.0 g/dL   RDW 02.7 25.3 - 66.4 %   Platelets 241 150 - 400 K/uL   nRBC 0.0 0.0 - 0.2 %   Neutrophils Relative % 62 %   Neutro Abs 3.6 1.7 - 7.7 K/uL   Lymphocytes Relative 18 %   Lymphs Abs 1.0 0.7 - 4.0 K/uL   Monocytes Relative 8 %   Monocytes Absolute 0.4 0.1 - 1.0 K/uL   Eosinophils Relative 10 %   Eosinophils Absolute 0.6 (H) 0.0 - 0.5 K/uL   Basophils Relative 1 %   Basophils Absolute 0.0 0.0 - 0.1 K/uL   Immature Granulocytes  1 %   Abs Immature Granulocytes 0.03 0.00 - 0.07 K/uL    Comment: Performed at [facility], [address], [place], [place] [address]  Comprehensive metabolic panel     Status: Abnormal   Collection Time: 07/30/23 10:36 PM  Result Value Ref Range   Sodium 138 135 - 145 mmol/L   Potassium 4.1 3.5 - 5.1 mmol/L    Comment: HEMOLYSIS AT THIS LEVEL MAY AFFECT RESULT   Chloride 102 98 - 111 mmol/L   CO2 18 (L) 22 - 32 mmol/L   Glucose, Bld 121 (H) 70 - 99 mg/dL    Comment: Glucose reference range applies only to samples taken after fasting for at least 8 hours.   BUN 46 (H) 6 - 20 mg/dL   Creatinine, Ser 4.69 (H) 0.44 - 1.00 mg/dL   Calcium 8.2 (L) 8.9 - 10.3 mg/dL   Total Protein 7.0 6.5 - 8.1 g/dL   Albumin 2.3 (L) 3.5 - 5.0 g/dL   AST 33 15 - 41 U/L    Comment: HEMOLYSIS AT THIS LEVEL MAY AFFECT RESULT   ALT 12 0 - 44 U/L    Comment: HEMOLYSIS AT THIS LEVEL MAY AFFECT RESULT   Alkaline Phosphatase 119 38 - 126 U/L   Total Bilirubin 1.0 <1.2 mg/dL    Comment: HEMOLYSIS AT THIS LEVEL MAY AFFECT RESULT   GFR, Estimated 6 (L) >60 mL/min    Comment: (NOTE) Calculated using the CKD-EPI Creatinine Equation (2021)    Anion gap 18 (H) 5 - 15     Comment: Performed at [facility], [address], [place], [place] [address]  Troponin I (High Sensitivity)     Status: Abnormal   Collection Time: 07/31/23 12:44 AM  Result Value Ref Range   Troponin I (High Sensitivity) 45 (H) <18 ng/L    Comment: (NOTE) Elevated high sensitivity troponin I (hsTnI) values and significant  changes across serial measurements may suggest ACS but many other  chronic and acute conditions are known to elevate hsTnI results.  Refer to the "Links" section for chest pain algorithms and additional  guidance. Performed at [facility], [address] [place], [place] [address]   I-Stat CG4 Lactic Acid     Status: None   Collection Time: 07/31/23 12:53 AM  Result Value Ref Range   Lactic Acid, Venous 0.8 0.5 - 1.9 mmol/L  Urinalysis, w/ Reflex to Culture (Infection Suspected) -Urine, Clean Catch     Status: Abnormal   Collection Time: 07/31/23  2:01 AM  Result Value Ref Range   Specimen Source URINE, CLEAN CATCH    Color, Urine STRAW (A) YELLOW   APPearance CLEAR CLEAR   Specific Gravity, Urine 1.005 1.005 - 1.030   pH 7.0 5.0 - 8.0   Glucose, UA 50 (A) NEGATIVE mg/dL   Hgb urine dipstick NEGATIVE NEGATIVE   Bilirubin Urine NEGATIVE NEGATIVE   Ketones, ur NEGATIVE NEGATIVE mg/dL   Protein, ur 440 (A) NEGATIVE mg/dL   Nitrite NEGATIVE NEGATIVE   Leukocytes,Ua TRACE (A) NEGATIVE   RBC / HPF 0-5 0 - 5 RBC/hpf   WBC, UA 0-5 0 - 5 WBC/hpf    Comment:        Reflex urine culture not performed if WBC <=10, OR if Squamous epithelial cells >5. If Squamous epithelial cells >5 suggest recollection.    Bacteria, UA RARE (A) NONE SEEN   Squamous Epithelial / HPF 0-5 0 - 5 /HPF  Comment: Performed at [facility], [address], [place], [place] [address]  Rapid urine drug screen (hospital performed)     Status: Abnormal   Collection Time: 07/31/23  2:01 AM  Result Value Ref Range   Opiates NONE DETECTED NONE DETECTED   Cocaine  POSITIVE (A) NONE DETECTED   Benzodiazepines NONE DETECTED NONE DETECTED   Amphetamines NONE DETECTED NONE DETECTED   Tetrahydrocannabinol NONE DETECTED NONE DETECTED   Barbiturates NONE DETECTED NONE DETECTED    Comment: (NOTE) DRUG SCREEN FOR MEDICAL PURPOSES ONLY.  IF CONFIRMATION IS NEEDED FOR ANY PURPOSE, NOTIFY LAB WITHIN 5 DAYS.  LOWEST DETECTABLE LIMITS FOR URINE DRUG SCREEN Drug Class                     Cutoff (ng/mL) Amphetamine and metabolites    1000 Barbiturate and metabolites    200 Benzodiazepine                 200 Opiates and metabolites        300 Cocaine and metabolites        300 THC                            50 Performed at [facility], [address], [place], [place] [address]   Troponin I (High Sensitivity)     Status: Abnormal   Collection Time: 07/31/23  2:03 AM  Result Value Ref Range   Troponin I (High Sensitivity) 49 (H) <18 ng/L    Comment: (NOTE) Elevated high sensitivity troponin I (hsTnI) values and significant  changes across serial measurements may suggest ACS but many other  chronic and acute conditions are known to elevate hsTnI results.  Refer to the "Links" section for chest pain algorithms and additional  guidance. Performed at [facility], [address] [place], [place], [place] [address]   Sodium, urine, random     Status: None   Collection Time: 07/31/23  2:15 AM  Result Value Ref Range   Sodium, Ur 62 mmol/L    Comment: Performed at [facility], [address], [place], [place] [address]  Creatinine, urine, random     Status: None   Collection Time: 07/31/23  2:15 AM  Result Value Ref Range   Creatinine, Urine 24 mg/dL    Comment: Performed at [facility], [address] [place], [place] [address]  Troponin I (High Sensitivity)     Status: Abnormal   Collection Time: 07/31/23  4:45 AM  Result Value Ref Range   Troponin I (High Sensitivity) 48 (H) <18 ng/L    Comment: (NOTE) Elevated high sensitivity  troponin I (hsTnI) values and significant  changes across serial measurements may suggest ACS but many other  chronic and acute conditions are known to elevate hsTnI results.  Refer to the "Links" section for chest pain algorithms and additional  guidance. Performed at [facility], [address], [place], [place] [address]   POC CBG, ED     Status: Abnormal   Collection Time: 07/31/23  8:29 AM  Result Value Ref Range   Glucose-Capillary 101 (H) 70 - 99 mg/dL    Comment: Glucose reference range applies only to samples taken after fasting for at least 8 hours.  Hepatitis B surface antigen     Status: None   Collection Time: 07/31/23 11:29 AM  Result Value Ref Range  Hepatitis B Surface Ag NON REACTIVE NON REACTIVE    Comment: Performed at [facility], [address] [place], [place] [address]  Ethanol     Status: None   Collection Time: 07/31/23 11:36 AM  Result Value Ref Range   Alcohol, Ethyl (B) <10 <10 mg/dL    Comment: (NOTE) Lowest detectable limit for serum alcohol is 10 mg/dL.  For medical purposes only. Performed at [facility], [address] [place], [place] [address]   Magnesium     Status: Abnormal   Collection Time: 07/31/23 11:36 AM  Result Value Ref Range   Magnesium 1.4 (L) 1.7 - 2.4 mg/dL    Comment: Performed at [facility], [address], [place], [place] [address]  CK     Status: None   Collection Time: 07/31/23 11:36 AM  Result Value Ref Range   Total CK 200 38 - 234 U/L    Comment: Performed at [facility], [address], [place], [place] [address]  Renal function panel     Status: Abnormal   Collection Time: 07/31/23 11:36 AM  Result Value Ref Range   Sodium 135 135 - 145 mmol/L   Potassium 3.4 (L) 3.5 - 5.1 mmol/L   Chloride 104 98 - 111 mmol/L   CO2 17 (L) 22 - 32 mmol/L   Glucose, Bld 147 (H) 70 - 99 mg/dL    Comment: Glucose reference range applies only to samples taken after fasting for at least 8  hours.   BUN 41 (H) 6 - 20 mg/dL   Creatinine, Ser 2.44 (H) 0.44 - 1.00 mg/dL   Calcium 7.7 (L) 8.9 - 10.3 mg/dL   Phosphorus 5.2 (H) 2.5 - 4.6 mg/dL   Albumin 1.7 (L) 3.5 - 5.0 g/dL   GFR, Estimated 6 (L) >60 mL/min    Comment: (NOTE) Calculated using the CKD-EPI Creatinine Equation (2021)    Anion gap 14 5 - 15    Comment: Performed at [facility], [address], [place], [place] [address]  Ferritin     Status: Abnormal   Collection Time: 07/31/23 11:36 AM  Result Value Ref Range   Ferritin 374 (H) 11 - 307 ng/mL    Comment: Performed at [facility], [address], [place], [place] [address]  Iron and TIBC     Status: Abnormal   Collection Time: 07/31/23 11:36 AM  Result Value Ref Range   Iron 64 28 - 170 ug/dL   TIBC 440 (L) 347 - 425 ug/dL   Saturation Ratios 32 (H) 10.4 - 31.8 %   UIBC 136 ug/dL    Comment: Performed at [facility], [address] [place], [place] [address]   DG Chest 2 View  Result Date: 07/30/2023 CLINICAL DATA:  Chest pain, shortness of breath EXAM: CHEST - 2 VIEW COMPARISON:  03/22/2023 FINDINGS: Lungs are clear.  No pleural effusion or pneumothorax. The heart is top-normal in size. Suspected healed fracture deformities of the in the right posterior 6th and left posterior 7th ribs. Thoracic spine is within normal limits. IMPRESSION: Normal chest radiographs. Electronically Signed   By: Charline Bills M.D.   On: 07/30/2023 22:46    Pending Labs Unresulted Labs (From admission, onward)     Start     Ordered   08/01/23 0500  CBC  Tomorrow morning,   R        07/31/23 7564   07/31/23 1204  Hepatitis B surface antibody,quantitative  (New Admission [facility] (Hepatitis B))  Add-on,   AD        07/31/23 1204   07/31/23 1027  Parathyroid hormone, intact (no Ca)  Once,   R        07/31/23 1029   07/31/23 0847  Renal function panel  Daily,   R      07/31/23 0846   07/31/23 0847  Urea nitrogen, urine  Once,   R        07/31/23 0846             Vitals/Pain Today's Vitals   07/31/23 1415 07/31/23 1418 07/31/23 1456 07/31/23 1457  BP: 130/76  124/65   Pulse: 99  98 98  Resp: 17  (!) 24 20  Temp:  98 F (36.7 C)    TempSrc:  Oral    SpO2: 100%  100% 100%  Weight:      Height:      PainSc:        Isolation Precautions No active isolations  Medications Medications  nitroGLYCERIN 50 mg in dextrose 5 % 250 mL (0.2 mg/mL) infusion (150 mcg/min Intravenous Rate/Dose Change 07/31/23 1500)  nitroGLYCERIN (NITROSTAT) SL tablet 0.4 mg (0.4 mg Sublingual Given 07/30/23 2246)  heparin injection 5,000 Units (5,000 Units Subcutaneous Given 07/31/23 1420)  sodium chloride flush (NS) 0.9 % injection 3 mL (3 mLs Intravenous Not Given 07/31/23 1010)  acetaminophen (TYLENOL) tablet 650 mg (has no administration in time range)    Or  acetaminophen (TYLENOL) suppository 650 mg (has no administration in time range)  albuterol (PROVENTIL) (2.5 MG/3ML) 0.083% nebulizer solution 2.5 mg (has no administration in time range)  LORazepam (ATIVAN) injection 0.5 mg (0.5 mg Intravenous Given 07/31/23 1009)  trimethobenzamide (TIGAN) injection 200 mg (has no administration in time range)  hydrALAZINE (APRESOLINE) injection 10 mg (10 mg Intravenous Given 07/31/23 0940)  amLODipine (NORVASC) tablet 10 mg (10 mg Oral Given 07/31/23 0939)  hydrALAZINE (APRESOLINE) tablet 100 mg (100 mg Oral Given 07/31/23 1418)  calcitRIOL (ROCALTROL) capsule 0.25 mcg (0.25 mcg Oral Not Given 07/31/23 1359)  zolpidem (AMBIEN) tablet 5 mg (has no administration in time range)  linagliptin (TRADJENTA) tablet 5 mg (has no administration in time range)  arformoterol (BROVANA) nebulizer solution 15 mcg (15 mcg Nebulization Given 07/31/23 1010)  budesonide (PULMICORT) nebulizer solution 0.5 mg (0.5 mg Nebulization Given 07/31/23 1010)  loratadine (CLARITIN) tablet 10 mg (10 mg Oral Given 07/31/23 0940)  naphazoline-pheniramine (NAPHCON-A) 0.025-0.3 % ophthalmic  solution 2 drop (2 drops Both Eyes Not Given 07/31/23 1359)  triamcinolone ointment (KENALOG) 0.1 % 1 Application (has no administration in time range)  gabapentin (NEURONTIN) capsule 100 mg (100 mg Oral Given 07/31/23 0939)  melatonin tablet 5 mg (has no administration in time range)  aspirin EC tablet 81 mg (81 mg Oral Given 07/31/23 0940)  atorvastatin (LIPITOR) tablet 80 mg (80 mg Oral Given 07/31/23 0939)  oxyCODONE-acetaminophen (PERCOCET/ROXICET) 5-325 MG per tablet 1 tablet (1 tablet Oral Given 07/31/23 1204)    And  oxyCODONE (Oxy IR/ROXICODONE) immediate release tablet 5 mg (5 mg Oral Given 07/31/23 1204)  Chlorhexidine Gluconate Cloth 2 % PADS 6 each (has no administration in time range)  acetaminophen (TYLENOL) tablet 650 mg (650 mg Oral Given 07/31/23 0501)    Mobility walks     Focused Assessments    R Recommendations: See Admitting Provider Note  Report given to:   Additional Notes:

## 2023-08-01 ENCOUNTER — Inpatient Hospital Stay (HOSPITAL_COMMUNITY): Payer: 59 | Admitting: Certified Registered"

## 2023-08-01 ENCOUNTER — Other Ambulatory Visit: Payer: Self-pay

## 2023-08-01 ENCOUNTER — Encounter (HOSPITAL_COMMUNITY)
Admission: EM | Disposition: A | Discharge status: Home / Self Care | Payer: Self-pay | Source: Home / Self Care | Attending: Family Medicine

## 2023-08-01 ENCOUNTER — Inpatient Hospital Stay (HOSPITAL_COMMUNITY): Payer: 59

## 2023-08-01 ENCOUNTER — Encounter (HOSPITAL_COMMUNITY): Payer: Self-pay | Admitting: Internal Medicine

## 2023-08-01 DIAGNOSIS — Z992 Dependence on renal dialysis: Secondary | ICD-10-CM

## 2023-08-01 DIAGNOSIS — E1122 Type 2 diabetes mellitus with diabetic chronic kidney disease: Secondary | ICD-10-CM

## 2023-08-01 DIAGNOSIS — I509 Heart failure, unspecified: Secondary | ICD-10-CM | POA: Diagnosis not present

## 2023-08-01 DIAGNOSIS — N186 End stage renal disease: Secondary | ICD-10-CM | POA: Diagnosis not present

## 2023-08-01 DIAGNOSIS — I132 Hypertensive heart and chronic kidney disease with heart failure and with stage 5 chronic kidney disease, or end stage renal disease: Secondary | ICD-10-CM | POA: Diagnosis not present

## 2023-08-01 DIAGNOSIS — N185 Chronic kidney disease, stage 5: Secondary | ICD-10-CM | POA: Diagnosis not present

## 2023-08-01 DIAGNOSIS — I161 Hypertensive emergency: Secondary | ICD-10-CM | POA: Diagnosis not present

## 2023-08-01 HISTORY — PX: AV FISTULA PLACEMENT: SHX1204

## 2023-08-01 HISTORY — PX: INSERTION OF DIALYSIS CATHETER: SHX1324

## 2023-08-01 LAB — CBC
HCT: 24.6 % — ABNORMAL LOW (ref 36.0–46.0)
Hemoglobin: 8.3 g/dL — ABNORMAL LOW (ref 12.0–15.0)
MCH: 28.9 pg (ref 26.0–34.0)
MCHC: 33.7 g/dL (ref 30.0–36.0)
MCV: 85.7 fL (ref 80.0–100.0)
Platelets: 223 10*3/uL (ref 150–400)
RBC: 2.87 MIL/uL — ABNORMAL LOW (ref 3.87–5.11)
RDW: 15.3 % (ref 11.5–15.5)
WBC: 5 10*3/uL (ref 4.0–10.5)
nRBC: 0 % (ref 0.0–0.2)

## 2023-08-01 LAB — GLUCOSE, CAPILLARY
Glucose-Capillary: 80 mg/dL (ref 70–99)
Glucose-Capillary: 88 mg/dL (ref 70–99)

## 2023-08-01 LAB — RENAL FUNCTION PANEL
Albumin: 1.5 g/dL — ABNORMAL LOW (ref 3.5–5.0)
Anion gap: 10 (ref 5–15)
BUN: 45 mg/dL — ABNORMAL HIGH (ref 6–20)
CO2: 19 mmol/L — ABNORMAL LOW (ref 22–32)
Calcium: 7.5 mg/dL — ABNORMAL LOW (ref 8.9–10.3)
Chloride: 106 mmol/L (ref 98–111)
Creatinine, Ser: 7.49 mg/dL — ABNORMAL HIGH (ref 0.44–1.00)
GFR, Estimated: 6 mL/min — ABNORMAL LOW (ref >60)
Glucose, Bld: 77 mg/dL (ref 70–99)
Phosphorus: 5.8 mg/dL — ABNORMAL HIGH (ref 2.5–4.6)
Potassium: 3.8 mmol/L (ref 3.5–5.1)
Sodium: 135 mmol/L (ref 135–145)

## 2023-08-01 LAB — HEPATITIS B SURFACE ANTIBODY, QUANTITATIVE: Hep B S AB Quant (Post): <3.5 m[IU]/mL — ABNORMAL LOW

## 2023-08-01 LAB — UREA NITROGEN, URINE: Urea Nitrogen, Ur: 138 mg/dL

## 2023-08-01 SURGERY — ARTERIOVENOUS (AV) FISTULA CREATION
Anesthesia: General | Site: Neck | Laterality: Right

## 2023-08-01 MED ORDER — EPHEDRINE SULFATE-NACL 50-0.9 MG/10ML-% IV SOSY
PREFILLED_SYRINGE | INTRAVENOUS | Status: DC | PRN
  Administered 2023-08-01: 10 mg via INTRAVENOUS

## 2023-08-01 MED ORDER — HEPARIN 6000 UNIT IRRIGATION SOLUTION
Status: AC
  Filled 2023-08-01: qty 500

## 2023-08-01 MED ORDER — MIDAZOLAM HCL 2 MG/2ML IJ SOLN
INTRAMUSCULAR | Status: AC
  Filled 2023-08-01: qty 2

## 2023-08-01 MED ORDER — PHENYLEPHRINE 80 MCG/ML (10ML) SYRINGE FOR IV PUSH (FOR BLOOD PRESSURE SUPPORT)
PREFILLED_SYRINGE | INTRAVENOUS | Status: DC | PRN
  Administered 2023-08-01 (×3): 160 ug via INTRAVENOUS

## 2023-08-01 MED ORDER — HEPARIN SODIUM (PORCINE) 1000 UNIT/ML IJ SOLN
INTRAMUSCULAR | Status: DC | PRN
  Administered 2023-08-01: 3200 [IU]

## 2023-08-01 MED ORDER — OXYCODONE HCL 5 MG/5ML PO SOLN: 5.0000 mg | Freq: Once | ORAL | Status: DC | PRN

## 2023-08-01 MED ORDER — PHENYLEPHRINE HCL-NACL 20-0.9 MG/250ML-% IV SOLN
INTRAVENOUS | Status: DC | PRN
  Administered 2023-08-01: 50 ug/min via INTRAVENOUS

## 2023-08-01 MED ORDER — FENTANYL CITRATE (PF) 250 MCG/5ML IJ SOLN
INTRAMUSCULAR | Status: DC | PRN
  Administered 2023-08-01: 50 ug via INTRAVENOUS
  Administered 2023-08-01: 75 ug via INTRAVENOUS

## 2023-08-01 MED ORDER — EPHEDRINE 5 MG/ML INJ
INTRAVENOUS | Status: AC
  Filled 2023-08-01: qty 5

## 2023-08-01 MED ORDER — CHLORHEXIDINE GLUCONATE 0.12 % MT SOLN: 15.0000 mL | Freq: Once | OROMUCOSAL | Status: AC

## 2023-08-01 MED ORDER — LIDOCAINE 2% (20 MG/ML) 5 ML SYRINGE
INTRAMUSCULAR | Status: DC | PRN
  Administered 2023-08-01: 60 mg via INTRAVENOUS

## 2023-08-01 MED ORDER — HEPARIN SODIUM (PORCINE) 1000 UNIT/ML IJ SOLN
INTRAMUSCULAR | Status: AC
  Filled 2023-08-01: qty 10

## 2023-08-01 MED ORDER — HEPARIN 6000 UNIT IRRIGATION SOLUTION
Status: DC | PRN
  Administered 2023-08-01: 1

## 2023-08-01 MED ORDER — MIDAZOLAM HCL 2 MG/2ML IJ SOLN
INTRAMUSCULAR | Status: DC | PRN
  Administered 2023-08-01: 2 mg via INTRAVENOUS

## 2023-08-01 MED ORDER — HEPARIN SODIUM (PORCINE) 1000 UNIT/ML IJ SOLN
INTRAMUSCULAR | Status: DC | PRN
  Administered 2023-08-01: 3000 [IU] via INTRAVENOUS

## 2023-08-01 MED ORDER — OXYCODONE HCL 5 MG PO TABS: 5.0000 mg | ORAL_TABLET | Freq: Once | ORAL | Status: DC | PRN

## 2023-08-01 MED ORDER — DROPERIDOL 2.5 MG/ML IJ SOLN: 0.6250 mg | Freq: Once | INTRAMUSCULAR | Status: DC | PRN

## 2023-08-01 MED ORDER — HEPARIN SODIUM (PORCINE) 1000 UNIT/ML IJ SOLN
INTRAMUSCULAR | Status: AC
  Filled 2023-08-01: qty 1

## 2023-08-01 MED ORDER — SODIUM CHLORIDE 0.9 % IV SOLN: INTRAVENOUS | Status: DC

## 2023-08-01 MED ORDER — LIDOCAINE 2% (20 MG/ML) 5 ML SYRINGE
INTRAMUSCULAR | Status: AC
  Filled 2023-08-01: qty 5

## 2023-08-01 MED ORDER — FENTANYL CITRATE (PF) 100 MCG/2ML IJ SOLN: 25.0000 ug | INTRAMUSCULAR | Status: DC | PRN

## 2023-08-01 MED ORDER — EPINEPHRINE 1 MG/10ML IJ SOSY
PREFILLED_SYRINGE | INTRAMUSCULAR | Status: DC | PRN
  Administered 2023-08-01: 20 ug via INTRAVENOUS
  Administered 2023-08-01: 10 ug via INTRAVENOUS

## 2023-08-01 MED ORDER — ONDANSETRON HCL 4 MG/2ML IJ SOLN
INTRAMUSCULAR | Status: DC | PRN
  Administered 2023-08-01: 4 mg via INTRAVENOUS

## 2023-08-01 MED ORDER — PROPOFOL 10 MG/ML IV BOLUS
INTRAVENOUS | Status: AC
  Filled 2023-08-01: qty 20

## 2023-08-01 MED ORDER — ACETAMINOPHEN 10 MG/ML IV SOLN: 1000.0000 mg | Freq: Once | INTRAVENOUS | Status: DC | PRN

## 2023-08-01 MED ORDER — PROPOFOL 10 MG/ML IV BOLUS
INTRAVENOUS | Status: DC | PRN
  Administered 2023-08-01: 150 mg via INTRAVENOUS

## 2023-08-01 MED ORDER — CEFAZOLIN SODIUM-DEXTROSE 2-3 GM-%(50ML) IV SOLR
INTRAVENOUS | Status: DC | PRN
  Administered 2023-08-01: 2 g via INTRAVENOUS

## 2023-08-01 MED ORDER — CHLORHEXIDINE GLUCONATE 0.12 % MT SOLN
OROMUCOSAL | Status: AC
  Administered 2023-08-01: 15 mL via OROMUCOSAL
  Filled 2023-08-01: qty 15

## 2023-08-01 MED ORDER — ORAL CARE MOUTH RINSE: 15.0000 mL | Freq: Once | OROMUCOSAL | Status: AC

## 2023-08-01 MED ORDER — FENTANYL CITRATE (PF) 250 MCG/5ML IJ SOLN
INTRAMUSCULAR | Status: AC
  Filled 2023-08-01: qty 5

## 2023-08-01 MED ORDER — EPINEPHRINE 1 MG/10ML IJ SOSY
PREFILLED_SYRINGE | INTRAMUSCULAR | Status: AC
  Filled 2023-08-01: qty 10

## 2023-08-01 MED ORDER — STERILE WATER FOR IRRIGATION IR SOLN
Status: DC | PRN
  Administered 2023-08-01: 1000 mL

## 2023-08-01 MED ORDER — PHENYLEPHRINE 80 MCG/ML (10ML) SYRINGE FOR IV PUSH (FOR BLOOD PRESSURE SUPPORT)
PREFILLED_SYRINGE | INTRAVENOUS | Status: AC
  Filled 2023-08-01: qty 10

## 2023-08-01 MED ORDER — DARBEPOETIN ALFA 60 MCG/0.3ML IJ SOSY
60.0000 ug | PREFILLED_SYRINGE | INTRAMUSCULAR | Status: DC
  Administered 2023-08-01: 60 ug via SUBCUTANEOUS
  Filled 2023-08-01: qty 0.3

## 2023-08-01 MED ORDER — 0.9 % SODIUM CHLORIDE (POUR BTL) OPTIME
TOPICAL | Status: DC | PRN
  Administered 2023-08-01: 1000 mL

## 2023-08-01 SURGICAL SUPPLY — 44 items
ARMBAND PINK RESTRICT EXTREMIT (MISCELLANEOUS) ×2 IMPLANT
BAG BANDED W/RUBBER/TAPE 36X54 (MISCELLANEOUS) IMPLANT
BAG COUNTER SPONGE SURGICOUNT (BAG) ×2 IMPLANT
BAG DECANTER FOR FLEXI CONT (MISCELLANEOUS) ×2 IMPLANT
BIOPATCH RED 1 DISK 7.0 (GAUZE/BANDAGES/DRESSINGS) ×2 IMPLANT
CANISTER SUCT 3000ML PPV (MISCELLANEOUS) ×2 IMPLANT
CATH PALINDROME-P 19CM W/VT (CATHETERS) IMPLANT
CATH PALINDROME-P 23CM W/VT (CATHETERS) IMPLANT
CATH PALINDROME-P 28CM W/VT (CATHETERS) IMPLANT
CLIP TI MEDIUM 6 (CLIP) ×2 IMPLANT
CLIP TI WIDE RED SMALL 6 (CLIP) ×2 IMPLANT
COVER DOME SNAP 22 D (MISCELLANEOUS) IMPLANT
COVER PROBE W GEL 5X96 (DRAPES) ×2 IMPLANT
DERMABOND ADVANCED .7 DNX12 (GAUZE/BANDAGES/DRESSINGS) ×2 IMPLANT
DRAPE SURG ORHT 6 SPLT 77X108 (DRAPES) IMPLANT
ELECT REM PT RETURN 9FT ADLT (ELECTROSURGICAL) ×2
ELECTRODE REM PT RTRN 9FT ADLT (ELECTROSURGICAL) ×2 IMPLANT
GAUZE 4X4 16PLY ~~LOC~~+RFID DBL (SPONGE) ×2 IMPLANT
GLOVE BIOGEL PI IND STRL 7.0 (GLOVE) ×2 IMPLANT
GOWN STRL REUS W/ TWL LRG LVL3 (GOWN DISPOSABLE) ×4 IMPLANT
GOWN STRL REUS W/ TWL XL LVL3 (GOWN DISPOSABLE) ×2 IMPLANT
KIT BASIN OR (CUSTOM PROCEDURE TRAY) ×2 IMPLANT
KIT TURNOVER KIT B (KITS) ×2 IMPLANT
NDL 18GX1X1/2 (RX/OR ONLY) (NEEDLE) ×2 IMPLANT
NDL HYPO 25GX1X1/2 BEV (NEEDLE) ×2 IMPLANT
NEEDLE 18GX1X1/2 (RX/OR ONLY) (NEEDLE) ×2 IMPLANT
NEEDLE HYPO 25GX1X1/2 BEV (NEEDLE) ×4 IMPLANT
NS IRRIG 1000ML POUR BTL (IV SOLUTION) ×2 IMPLANT
PACK CV ACCESS (CUSTOM PROCEDURE TRAY) ×2 IMPLANT
PACK SRG BSC III STRL LF ECLPS (CUSTOM PROCEDURE TRAY) ×2 IMPLANT
PAD ARMBOARD 7.5X6 YLW CONV (MISCELLANEOUS) ×4 IMPLANT
SLING ARM FOAM STRAP MED (SOFTGOODS) IMPLANT
SUT ETHILON 3 0 PS 1 (SUTURE) ×2 IMPLANT
SUT MNCRL AB 4-0 PS2 18 (SUTURE) ×2 IMPLANT
SUT PROLENE 6 0 BV (SUTURE) ×2 IMPLANT
SUT VIC AB 3-0 SH 27X BRD (SUTURE) ×2 IMPLANT
SYR 10ML LL (SYRINGE) ×2 IMPLANT
SYR 20ML LL LF (SYRINGE) ×4 IMPLANT
SYR 5ML LL (SYRINGE) ×2 IMPLANT
SYR CONTROL 10ML LL (SYRINGE) ×2 IMPLANT
TOWEL GREEN STERILE (TOWEL DISPOSABLE) ×2 IMPLANT
TOWEL GREEN STERILE FF (TOWEL DISPOSABLE) ×4 IMPLANT
UNDERPAD 30X36 HEAVY ABSORB (UNDERPADS AND DIAPERS) ×2 IMPLANT
WATER STERILE IRR 1000ML POUR (IV SOLUTION) ×2 IMPLANT

## 2023-08-01 NOTE — Progress Notes (Signed)
Nephrology Follow-Up Consult note   Assessment/Recommendations: Carol [name] is a/an 62 y.o. female with a past medical history significant for DM2, CHF, COPD, CKD5, admitted for ESRD.      ESRD: progressive CKD V now ESRD. -plan for [facility] and AVF/G with VVS today; appreciate help -plan for HD today after surgery then again tomorrow and Saturday -Start clip today -Continue to monitor daily Cr, Dose meds for GFR -Monitor Daily I/Os, Daily weight  -Avoid nephrotoxic medications including NSAIDs -Use synthetic opioids (Fentanyl/Dilaudid) if needed  Metabolic acidosis: Secondary to CKD.  Will improve with renal replacement therapy  Anemia of CKD: Hemoglobin 8.3.  Iron stores adequate.  Will start ESA  Secondary hyperparathyroidism: Calcium corrects near normal.  Phosphorus near goal at 5.8.  Follow-up PTH  Hypertension: Has improved his cocaine is worn off.  Continue current medications  Cocaine use disorder: Counseled on cessation   Recommendations conveyed to primary service.    Darnell [facility] 08/01/2023 9:43 AM  ___________________________________________________________  CC: Chest pain  Interval History/Subjective: Patient very anxious about her procedure today.  Otherwise denies any complaints.   Medications:  Current Facility-Administered Medications  Medication Dose Route Frequency Provider Last Rate Last Admin   0.9 %  sodium chloride infusion   Intravenous Continuous Trevor Iha, MD 10 mL/hr at 08/01/23 0909 New Bag at 08/01/23 0909   [MAR Hold] acetaminophen (TYLENOL) tablet 650 mg  650 mg Oral Q6H PRN Clydie Braun, MD       Or   Mitzi Hansen Hold] acetaminophen (TYLENOL) suppository 650 mg  650 mg Rectal Q6H PRN Clydie Braun, MD       [MAR Hold] albuterol (PROVENTIL) (2.5 MG/3ML) 0.083% nebulizer solution 2.5 mg  2.5 mg Nebulization Q6H PRN Clydie Braun, MD       [MAR Hold] amLODipine (NORVASC) tablet 10 mg  10 mg  Oral Daily Katrinka Blazing, Rondell A, MD   10 mg at 07/31/23 0939   [MAR Hold] arformoterol (BROVANA) nebulizer solution 15 mcg  15 mcg Nebulization BID Madelyn Flavors A, MD   15 mcg at 08/01/23 0809   [MAR Hold] aspirin EC tablet 81 mg  81 mg Oral Daily Smith, Rondell A, MD   81 mg at 07/31/23 0940   [MAR Hold] atorvastatin (LIPITOR) tablet 80 mg  80 mg Oral Daily Smith, Rondell A, MD   80 mg at 07/31/23 0939   [MAR Hold] budesonide (PULMICORT) nebulizer solution 0.5 mg  0.5 mg Nebulization BID Madelyn Flavors A, MD   0.5 mg at 08/01/23 0814   [MAR Hold] calcitRIOL (ROCALTROL) capsule 0.25 mcg  0.25 mcg Oral Daily Madelyn Flavors A, MD       [MAR Hold] Chlorhexidine Gluconate Cloth 2 % PADS 6 each  6 each Topical Q0600 Darnell Level, MD   6 each at 08/01/23 0536   [MAR Hold] Darbepoetin Alfa (ARANESP) injection 60 mcg  60 mcg Subcutaneous Q Thu-1800 Darnell Level, MD       [facility] Hold] gabapentin (NEURONTIN) capsule 100 mg  100 mg Oral BID Madelyn Flavors A, MD   100 mg at 07/31/23 2105   [facility] Hold] heparin injection 5,000 Units  5,000 Units Subcutaneous Q8H Smith, Rondell A, MD   5,000 Units at 08/01/23 0536   [facility] Hold] hydrALAZINE (APRESOLINE) injection 10 mg  10 mg Intravenous Q4H PRN Madelyn Flavors A, MD   10 mg at 07/31/23 0940   [MAR Hold] hydrALAZINE (APRESOLINE) tablet 100 mg  100  mg Oral Q8H Smith, Rondell A, MD   100 mg at 07/31/23 2106   [MAR Hold] linagliptin (TRADJENTA) tablet 5 mg  5 mg Oral QHS Smith, Rondell A, MD   5 mg at 07/31/23 2105   [MAR Hold] loratadine (CLARITIN) tablet 10 mg  10 mg Oral Daily Madelyn Flavors A, MD   10 mg at 07/31/23 0940   [MAR Hold] LORazepam (ATIVAN) injection 0.5 mg  0.5 mg Intravenous Q6H PRN Madelyn Flavors A, MD   0.5 mg at 07/31/23 1009   [MAR Hold] melatonin tablet 5 mg  5 mg Oral QHS PRN Madelyn Flavors A, MD   5 mg at 07/31/23 2105   [MAR Hold] naphazoline-pheniramine (NAPHCON-A) 0.025-0.3 % ophthalmic solution 2 drop  2 drop Both Eyes BID Madelyn Flavors  A, MD   2 drop at 07/31/23 2116   [facility] Hold] nitroGLYCERIN (NITROSTAT) SL tablet 0.4 mg  0.4 mg Sublingual Q5 min PRN Achille Rich, PA-C   0.4 mg at 07/30/23 2246   [MAR Hold] nitroGLYCERIN 50 mg in dextrose 5 % 250 mL (0.2 mg/mL) infusion  0-200 mcg/min Intravenous Titrated Achille Rich, PA-C   Stopped at 07/31/23 1519   [MAR Hold] oxyCODONE-acetaminophen (PERCOCET/ROXICET) 5-325 MG per tablet 1 tablet  1 tablet Oral Q4H PRN Clydie Braun, MD   1 tablet at 07/31/23 2105   And   [MAR Hold] oxyCODONE (Oxy IR/ROXICODONE) immediate release tablet 5 mg  5 mg Oral Q4H PRN Clydie Braun, MD       [facility] Hold] sodium chloride flush (NS) 0.9 % injection 3 mL  3 mL Intravenous Q12H Katrinka Blazing, Rondell A, MD   3 mL at 07/31/23 2109   [facility] Hold] triamcinolone ointment (KENALOG) 0.1 % 1 Application  1 Application Topical BID PRN Clydie Braun, MD       Mitzi Hansen Hold] trimethobenzamide (TIGAN) injection 200 mg  200 mg Intramuscular Q6H PRN Clydie Braun, MD       Mitzi Hansen Hold] zolpidem (AMBIEN) tablet 5 mg  5 mg Oral QHS PRN Clydie Braun, MD          Review of Systems: 10 systems reviewed and negative except per interval history/subjective  Physical Exam: Vitals:   08/01/23 0827 08/01/23 0838  BP: (!) 188/89 (!) 183/90  Pulse: 93 93  Resp: 17 18  Temp: 98.4 F (36.9 C) 98 F (36.7 C)  SpO2: 96% 97%   Total I/O In: -  Out: 400 [Urine:400]  Intake/Output Summary (Last 24 hours) at 08/01/2023 3016 Last data filed at 08/01/2023 0827 Gross per 24 hour  Intake 711.91 ml  Output 800 ml  Net -88.09 ml   Constitutional: well-appearing, anxious ENMT: ears and nose without scars or lesions, MMM CV: normal rate, no edema Respiratory: clear to auscultation, normal work of breathing Gastrointestinal: soft, non-tender, no palpable masses or hernias Skin: no visible lesions or rashes Psych: alert, judgement/insight appropriate   Test Results I personally reviewed new and old clinical labs and  radiology tests Lab Results  Component Value Date   NA 135 08/01/2023   K 3.8 08/01/2023   CL 106 08/01/2023   CO2 19 (L) 08/01/2023   BUN 45 (H) 08/01/2023   CREATININE 7.49 (H) 08/01/2023   GFR 61.90 07/30/2014   CALCIUM 7.5 (L) 08/01/2023   ALBUMIN 1.5 (L) 08/01/2023   PHOS 5.8 (H) 08/01/2023    CBC Recent Labs  Lab 07/30/23 2236 08/01/23 0234  WBC 5.7 5.0  NEUTROABS 3.6  --  HGB 10.9* 8.3*  HCT 32.7* 24.6*  MCV 85.4 85.7  PLT 241 223

## 2023-08-01 NOTE — Progress Notes (Addendum)
  Progress Note    08/01/2023 7:36 AM * No surgery found *  Subjective:  no complaints   Vitals:   08/01/23 0237 08/01/23 0519  BP: (!) 162/82 (!) 158/91  Pulse: 88 91  Resp: 20 18  Temp: 98 F (36.7 C) 98.3 F (36.8 C)  SpO2: 97% 96%   Physical Exam: Lungs:  non labored Extremities:  palpable L radial pulse; 2 IV in L forearm Neurologic: A&O  CBC    Component Value Date/Time   WBC 5.0 08/01/2023 0234   RBC 2.87 (L) 08/01/2023 0234   HGB 8.3 (L) 08/01/2023 0234   HCT 24.6 (L) 08/01/2023 0234   PLT 223 08/01/2023 0234   MCV 85.7 08/01/2023 0234   MCH 28.9 08/01/2023 0234   MCHC 33.7 08/01/2023 0234   RDW 15.3 08/01/2023 0234   LYMPHSABS 1.0 07/30/2023 2236   MONOABS 0.4 07/30/2023 2236   EOSABS 0.6 (H) 07/30/2023 2236   BASOSABS 0.0 07/30/2023 2236    BMET    Component Value Date/Time   NA 135 08/01/2023 0234   K 3.8 08/01/2023 0234   CL 106 08/01/2023 0234   CO2 19 (L) 08/01/2023 0234   GLUCOSE 77 08/01/2023 0234   BUN 45 (H) 08/01/2023 0234   CREATININE 7.49 (H) 08/01/2023 0234   CREATININE 4.87 (H) 04/03/2023 1605   CALCIUM 7.5 (L) 08/01/2023 0234   GFRNONAA 6 (L) 08/01/2023 0234   GFRNONAA 43 (L) 11/21/2020 1451   GFRAA 50 (L) 11/21/2020 1451    INR    Component Value Date/Time   INR 1.1 03/22/2023 1154     Intake/Output Summary (Last 24 hours) at 08/01/2023 0736 Last data filed at 08/01/2023 0536 Gross per 24 hour  Intake 711.91 ml  Output 400 ml  Net 311.91 ml     Assessment/Plan:  62 y.o. female with ESRD  Plan is for [facility] and L arm AV G vs F today in OR.  All questions answered and willing to proceed.  Continue npo.  Consent ordered   Emilie Rutter, PA-C [facility] [phone] 08/01/2023 7:36 AM   VASCULAR STAFF ADDENDUM: I have independently interviewed and examined the patient. I agree with the above.  TDC and L arm AVG vs AVF  Daria Pastures MD [facility]  [facility] Phone Number: [phone] 08/01/2023 9:36 AM

## 2023-08-01 NOTE — Progress Notes (Signed)
PROGRESS NOTE    Carol [name]  ZOX:[id number] DOB: Apr 11, 1961 DOA: 07/30/2023 PCP: [facility]  Chief Complaint  Patient presents with   Shortness of Breath   Chest Pain    Brief Narrative:   Carol [name] is Carol [name] 62 y.o. female with medical history significant of hypertension, diastolic congestive heart failure, COPD, diabetes mellitus type 2, sarcoidosis of the liver, chronic kidney disease stage V, chronic back pain, polysubstance abuse (including cocaine, alcohol, tobacco), and GERD who presents with complaints of shortness of breath and chest pain.  Symptoms started yesterday evening after she has mild cocaine prior.  She reported having pain across her chest with shortness of breath and intermittently productive cough.  Denies having any recent fevers, nausea, vomiting, or diarrhea.   Records note she had been hospitalized back in August 2024 for CHF and at that time her creatinine was noted to be 4.87 and had been sent home on torosemide.  She reports that she was seen by nephrology once in the outpatient setting after that hospitalization, but had not been back to them thereafter.  She reports being on chronic pain medicine due to history of injury to her back several years ago.  Patient has continued to be able to make urine.   In the emergency department patient was noted to be afebrile with respirations elevated to 32, blood pressures elevated up to 252/107, and all other vital signs relatively maintained.  Labs from 12/10 significant for hemoglobin 10.9, CO2 18, BUN 46, creatinine 7.14, anion gap 18, BUN 2.3, lactic acid 0.8, BNP 623.8, high-sensitivity troponins 39->45-> 49.  Chest x-ray noted heart at the upper limit of normal, but was otherwise did not note any acute abnormality.  Urinalysis did not note significant concern for infection.  UDS was positive for cocaine.  Nephrology have been formally consulted.  Patient having given Tylenol 650 mg p.o.,  nitroglycerin 0.4 mg sublingual, and noted on Sheleen Conchas nitroglycerin drip.  Nephrology have been consulted due to patient's worsening kidney function.  Assessment & Plan:   Principal Problem:   Chest pain Active Problems:   Elevated troponin   Hypertensive emergency   Acute renal failure superimposed on stage 5 chronic kidney disease, not on chronic dialysis (HCC)   Chronic diastolic CHF (congestive heart failure) (HCC)   borderline Prolonged QT interval   Type 2 diabetes mellitus with hyperlipidemia (HCC)   Metabolic acidosis, increased anion gap   Anemia of chronic disease   Chronic pain   Polysubstance abuse (HCC)  Chest pain Elevated troponin Acute.  Patient present with complaints of chest pain after using cocaine.  Chest x-ray noted heart size at the upper limit of normal, but did not note any acute abnormality.  High-sensitivity troponin 39->45-> 49-> 48.  EKG without significant ischemic changes. Patient with prior cardiac MRI which noted study was more consistent with Yunior Jain small prior RCA territory infarct and cardiac sarcoidosis in 06/2022. Suspect secondary to demand secondary to cocaine use. -Admit to progressive bed -Follow-up echocardiogram   Hypertensive emergency -Continue amlodipine, hydralazine, and Imdur -Hydralazine IV prn -follow with dialysis    Acute kidney injury superimposed on chronic kidney disease stage V On admission creatinine noted to be 7.14 with BUN 46.  Baseline creatinine previously had been around 4 when last checked in August of this year.  Nephrology have been formally consulted due to worsening kidney function who had also consulted vascular surgery for access. -s/p tunneled dialysis catheter and left arm first stage brachiobasilic  fistula creation  -plan for dialysis 12/12, 12/13, and 12/14.  Clip.     Diastolic congestive heart failure Chronic.  On physical exam patient appears relatively euvolemic besides some mild lower extremity edema.  Chest x-ray  did not note any acute abnormality.  BNP 623.8 which appears lower than prior admission in August of this year.  Last EF noted to be 60 to 65% with grade 2 diastolic dysfunction and moderately dilated left atrium when checked on 03/08/2023. -repeat echo pending  -Renal/carb modified diet with fluid restriction   Prolonged QT interval Acute on chronic.  -Caution with qt prolonging meds -Correct electrolyte abnormalities -trend EKG   Controlled diabetes mellitus type 2, without long-term use of insulin On admission glucose 121.  Last hemoglobin A1c 5.7 on 8//24.  Home medication regimen includes Tradjenta. -Hypoglycemic protocols -Continue Tradjenta -no need for BG monitoring   Hyperlipidemia -Continue atorvastatin   Metabolic acidosis with increased anion gap Improved, continued non anion gap acidosis, will monitor    Anemia of chronic disease Will trend Follow anemia labs   Chronic pain Patient is on chronic opioids due to history of back pain related to injury several years ago -Continue oxycodone as needed caution with AKI    Polysubstance abuse Patient admitted to using cocaine prior to arrival.  UDS positive for cocaine.  Patient does not drink alcohol on Willard Madrigal regular basis and only intermittently smokes cigarettes. -Transitions of care consulted for substance abuse    DVT prophylaxis: heparin Code Status: full Family Communication: none Disposition:   Status is: Inpatient Remains inpatient appropriate because: dispo per renal   Consultants:  Nephrology Vascular   Procedures:  12/12 PROCEDURE:   Ultrasound and fluoroscopic guided tunneled dialysis catheter placement Left arm first stage brachiobasilic fistula creation  Antimicrobials:  Anti-infectives (From admission, onward)    None       Subjective: Anxious regarding dialysis   Objective: Vitals:   08/01/23 1241 08/01/23 1245 08/01/23 1300 08/01/23 1315  BP: (!) 161/79 (!) 175/83 (!) 163/84 (!)  169/82  Pulse:      Resp:      Temp:      TempSrc:      SpO2:      Weight:      Height:        Intake/Output Summary (Last 24 hours) at 08/01/2023 1808 Last data filed at 08/01/2023 1143 Gross per 24 hour  Intake 890 ml  Output 810 ml  Net 80 ml   Filed Weights   07/31/23 1723 08/01/23 0519 08/01/23 0838  Weight: 75.9 kg 76.2 kg 77.1 kg    Examination:  General exam: Appears calm and comfortable  Respiratory system: unlabored Cardiovascular system: RRR Central nervous system: Alert and oriented. No focal neurological deficits. Extremities: L>R edema    Data Reviewed: I have personally reviewed following labs and imaging studies  CBC: Recent Labs  Lab 07/30/23 2236 08/01/23 0234  WBC 5.7 5.0  NEUTROABS 3.6  --   HGB 10.9* 8.3*  HCT 32.7* 24.6*  MCV 85.4 85.7  PLT 241 223    Basic Metabolic Panel: Recent Labs  Lab 07/30/23 2236 07/31/23 1136 08/01/23 0234  NA 138 135 135  K 4.1 3.4* 3.8  CL 102 104 106  CO2 18* 17* 19*  GLUCOSE 121* 147* 77  BUN 46* 41* 45*  CREATININE 7.14* 7.10* 7.49*  CALCIUM 8.2* 7.7* 7.5*  MG  --  1.4*  --   PHOS  --  5.2* 5.8*  GFR: Estimated Creatinine Clearance: 8 mL/min ([place]) (by C-G formula based on SCr of 7.49 mg/dL (H)).  Liver Function Tests: Recent Labs  Lab 07/30/23 2236 07/31/23 1136 08/01/23 0234  AST 33  --   --   ALT 12  --   --   ALKPHOS 119  --   --   BILITOT 1.0  --   --   PROT 7.0  --   --   ALBUMIN 2.3* 1.7* 1.5*    CBG: Recent Labs  Lab 07/31/23 0829 08/01/23 0824 08/01/23 1152  GLUCAP 101* 80 88     No results found for this or any previous visit (from the past 240 hours).       Radiology Studies: DG CHEST PORT 1 VIEW Result Date: 08/01/2023 CLINICAL DATA:  Dialysis catheter in place. EXAM: PORTABLE CHEST 1 VIEW COMPARISON:  Chest radiograph dated 07/30/2023. FINDINGS: Right IJ dialysis catheter with tip at the cavoatrial junction. No pneumothorax. No focal consolidation,  pleural effusion. Stable cardiac silhouette. No acute osseous pathology. IMPRESSION: Right IJ dialysis catheter with tip at the cavoatrial junction. No pneumothorax. Electronically Signed   By: Elgie Collard M.D.   On: 08/01/2023 15:56   HYBRID OR IMAGING ([facility] ONLY) Result Date: 08/01/2023 There is no interpretation for this exam.  This order is for images obtained during Svara Twyman surgical procedure.  Please See "Surgeries" Tab for more information regarding the procedure.   VAS [place] UPPER EXT VEIN MAPPING (PRE-OP AVF) Result Date: 07/31/2023 UPPER EXTREMITY VEIN MAPPING Patient Name:  KELITA BECHTEL  Date of Exam:   07/31/2023 Medical Rec #: [id number]                Accession #:    [id number] Date of Birth: 1960/10/29                Patient Gender: F Patient Age:   13 years Exam Location:  [facility] Procedure:      VAS [place] UPPER EXT VEIN MAPPING (PRE-OP AVF) Referring Phys: Remi Deter PEEPLES --------------------------------------------------------------------------------  Indications: Pre-access. Comparison Study: No prior studies. Performing Technologist: Chanda Busing RVT  Examination Guidelines: Johnaton Sonneborn complete evaluation includes B-mode imaging, spectral Doppler, color Doppler, and power Doppler as needed of all accessible portions of each vessel. Bilateral testing is considered an integral part of Lamonta Cypress complete examination. Limited examinations for reoccurring indications may be performed as noted. +-----------------+-------------+----------+---------+ Right Cephalic   Diameter (cm)Depth (cm)Findings  +-----------------+-------------+----------+---------+ Shoulder             0.29        0.80             +-----------------+-------------+----------+---------+ Prox upper arm       0.17        0.70             +-----------------+-------------+----------+---------+ Mid upper arm        0.22        0.27             +-----------------+-------------+----------+---------+ Dist upper arm        0.21        0.42             +-----------------+-------------+----------+---------+ Antecubital fossa    0.43        0.23   branching +-----------------+-------------+----------+---------+ Prox forearm         0.23        0.52   branching +-----------------+-------------+----------+---------+ Mid forearm  0.17        0.24   Thrombus  +-----------------+-------------+----------+---------+ Dist forearm         0.17        0.15             +-----------------+-------------+----------+---------+ +-----------------+-------------+----------+---------+ Right Basilic    Diameter (cm)Depth (cm)Findings  +-----------------+-------------+----------+---------+ Shoulder             0.18        1.00             +-----------------+-------------+----------+---------+ Dist upper arm       0.13        0.98             +-----------------+-------------+----------+---------+ Antecubital fossa    0.16        0.18   branching +-----------------+-------------+----------+---------+ Prox forearm         0.08        0.13             +-----------------+-------------+----------+---------+ Mid forearm          0.13        0.14             +-----------------+-------------+----------+---------+ Distal forearm       0.08        0.14             +-----------------+-------------+----------+---------+ +-----------------+-------------+----------+---------+ Left Cephalic    Diameter (cm)Depth (cm)Findings  +-----------------+-------------+----------+---------+ Shoulder             0.22        0.96             +-----------------+-------------+----------+---------+ Prox upper arm       0.18        0.52   branching +-----------------+-------------+----------+---------+ Mid upper arm        0.16        0.48             +-----------------+-------------+----------+---------+ Dist upper arm       0.15        0.43              +-----------------+-------------+----------+---------+ Antecubital fossa    0.15        0.61   branching +-----------------+-------------+----------+---------+ Prox forearm         0.17        0.16      IV     +-----------------+-------------+----------+---------+ Mid forearm          0.26        0.16             +-----------------+-------------+----------+---------+ Dist forearm         0.25        0.25             +-----------------+-------------+----------+---------+ +-----------------+-------------+----------+---------+ Left Basilic     Diameter (cm)Depth (cm)Findings  +-----------------+-------------+----------+---------+ Shoulder             0.21        2.10             +-----------------+-------------+----------+---------+ Mid upper arm        0.23        1.50             +-----------------+-------------+----------+---------+ Dist upper arm       0.24        0.84   branching +-----------------+-------------+----------+---------+ Antecubital fossa    0.14        0.74   branching +-----------------+-------------+----------+---------+  Prox forearm         0.13        0.14             +-----------------+-------------+----------+---------+ Mid forearm          0.14        0.17             +-----------------+-------------+----------+---------+ Distal forearm       0.12        0.20             +-----------------+-------------+----------+---------+ *See table(s) above for measurements and observations.  Diagnosing physician: Coral Else MD Electronically signed by Coral Else MD on 07/31/2023 at 9:09:24 PM.    Final    DG Chest 2 View Result Date: 07/30/2023 CLINICAL DATA:  Chest pain, shortness of breath EXAM: CHEST - 2 VIEW COMPARISON:  03/22/2023 FINDINGS: Lungs are clear.  No pleural effusion or pneumothorax. The heart is top-normal in size. Suspected healed fracture deformities of the in the right posterior 6th and left posterior 7th ribs.  Thoracic spine is within normal limits. IMPRESSION: Normal chest radiographs. Electronically Signed   By: Charline Bills M.D.   On: 07/30/2023 22:46        Scheduled Meds:  amLODipine  10 mg Oral Daily   arformoterol  15 mcg Nebulization BID   aspirin EC  81 mg Oral Daily   atorvastatin  80 mg Oral Daily   budesonide (PULMICORT) nebulizer solution  0.5 mg Nebulization BID   calcitRIOL  0.25 mcg Oral Daily   Chlorhexidine Gluconate Cloth  6 each Topical Q0600   darbepoetin (ARANESP) injection - DIALYSIS  60 mcg Subcutaneous Q Thu-1800   gabapentin  100 mg Oral BID   heparin  5,000 Units Subcutaneous Q8H   hydrALAZINE  100 mg Oral Q8H   linagliptin  5 mg Oral QHS   loratadine  10 mg Oral Daily   naphazoline-pheniramine  2 drop Both Eyes BID   sodium chloride flush  3 mL Intravenous Q12H   Continuous Infusions:  nitroGLYCERIN Stopped (07/31/23 1519)     LOS: 1 day    Time spent: over 30 min     Lacretia Nicks, MD [facility]   To contact the attending provider between 7A-7P or the covering provider during after hours 7P-7A, please log into the web site [email or web address] and access using universal [place] password for that web site. If you do not have the password, please call the hospital operator.  08/01/2023, 6:08 PM

## 2023-08-01 NOTE — Op Note (Addendum)
OPERATIVE NOTE  PROCEDURE:   Ultrasound and fluoroscopic guided tunneled dialysis catheter placement Left arm first stage brachiobasilic fistula creation  PRE-OPERATIVE DIAGNOSIS: ESRD on HD  POST-OPERATIVE DIAGNOSIS: same as above   SURGEON: Daria Pastures MD  ASSISTANT(S): Aggie Moats, PA  ANESTHESIA: general  ESTIMATED BLOOD LOSS: 20 cc  FINDING(S): Adequate size left basilic vein Small sclerotic left cephalic vein Palpable and Doppler thrill in fistula and palpable and multiphasic Doppler signal in radial artery postintervention  SPECIMEN(S): none  INDICATIONS:   Carol [name] is a 62 y.o. female with who was admitted to the hospital with shortness of breath and chest pain.  Found to have metabolic acidosis and progression to ESRD.  Vascular surgery was consulted for permanent dialysis access creation as well as a dialysis line placement.  Risks and benefits were reviewed, patient expressed understanding and was willing to proceed.  DESCRIPTION: The patient was brought to the operating room and positioned supine on the operating room table.  General esthesia was induced and the neck and left arm were prepped and draped in the usual sterile fashion.  A timeout was performed and preoperative antibiotics were administered.  Using ultrasound guidance the right internal jugular vein was accessed with micropuncture technique.  Through the micropuncture sheath, the guidewire was advanced into the superior vena cava.  A small incision was made around the skin access point.  The access point was serially dilated under direct fluoroscopic guidance.  A peel-away sheath was introduced into the superior vena cava under fluoroscopic guidance.  A counterincision was made in the chest under the clavicle.  A 19 cm tunnel dialysis catheter was then tunneled under the skin, over the clavicle into the incision in the neck.  The tunneling device was removed and the catheter fed through  the peel-away sheath into the superior vena cava.  The peel-away sheath was removed and the catheter gently pulled back.  Adequate position was confirmed with x-ray.  The catheter was tested and found to aspirate and flush with ease.    The catheter was sutured to the skin and the neck incision was closed with 4-0 Monocryl.  The catheter was then capped and heparin locked.  The basilic vein in the left arm was identified using ultrasound and appeared of sufficient size. A transverse incision was made above the elbow creese in the antecubital fossa. The basilic vein was identified and isolated for 4 cm in length.  The bicipital aponeurosis was partially released and the brachial artery freed from its paired brachial veins and secured with a vessel loop. The patient was heparinized. The basilic vein was marked and ligated distally with a 2-0 silk, then flushed with heparinized saline. Vascular clamps were placed proximally and distally on the brachial artery and a 5 mm arteriotomy  was created on the brachial artery. This was flushed with heparin saline. The vein was juxtaposed to the artery and an anastomosis was created using 6-0 Prolene.   Prior to completing the anastomsis, the vessels were flushed and the suture line was tied down. There was an excellent doppler thrill in the basilic vein from the anastomosis into the upper arm. The patient had a 2+ radial pulse. The incision was irrigated and hemostasis acheived. The deeper tissue was closed with 3-0 Vicryl and the skin closed with 4-0 Monocryl.   Dermabond was applied the incisions and the arm was ACE wrapped. He was transferred to PACU in stable condition.    Given the complexity  of the case,  the assistant was necessary in order to expedient the procedure and safely perform the technical aspects of the operation.  The assistant provided traction and countertraction to assist with exposure of the artery and vein.  They also assisted with suture ligation  of multiple venous branches.  They played a critical role in the anastomosis. These skills, especially following the Prolene suture for the anastomosis, could not have been adequately performed by a scrub tech assistant.       COMPLICATIONS: None apparent  CONDITION: Stable  Daria Pastures MD [facility] Phone Number: [phone] 08/01/2023 11:28 AM   08/01/2023, 11:28 AM

## 2023-08-01 NOTE — Plan of Care (Signed)

## 2023-08-01 NOTE — Plan of Care (Signed)
  Problem: Nutrition: Goal: Adequate nutrition will be maintained Outcome: Progressing   Problem: Safety: Goal: Ability to remain free from injury will improve Outcome: Progressing   Problem: Education: Goal: Knowledge of disease and its progression will improve Outcome: Progressing

## 2023-08-01 NOTE — Anesthesia Procedure Notes (Signed)
Procedure Name: LMA Insertion Date/Time: 08/01/2023 9:58 AM  Performed by: Gus Puma, CRNAPre-anesthesia Checklist: Patient identified, Emergency Drugs available, Suction available and Patient being monitored Patient Re-evaluated:Patient Re-evaluated prior to induction Oxygen Delivery Method: Circle System Utilized Preoxygenation: Pre-oxygenation with 100% oxygen Induction Type: IV induction Ventilation: Mask ventilation without difficulty LMA: LMA inserted LMA Size: 4.0 Number of attempts: 1 Airway Equipment and Method: Bite block Placement Confirmation: positive ETCO2 Tube secured with: Tape Dental Injury: Teeth and Oropharynx as per pre-operative assessment

## 2023-08-01 NOTE — Anesthesia Postprocedure Evaluation (Signed)
Anesthesia Post Note  Patient: Carol [name]  Procedure(s) Performed: LEFT ARM BRACHIOBASILIC FISTULA CREATION (Left: Arm Upper) INSERTION OF TUNNELED DIALYSIS CATHETER (Right: Neck)     Patient location during evaluation: PACU Anesthesia Type: General Level of consciousness: awake and alert, oriented and patient cooperative Pain management: pain level controlled Vital Signs Assessment: post-procedure vital signs reviewed and stable Respiratory status: spontaneous breathing, nonlabored ventilation and respiratory function stable Cardiovascular status: blood pressure returned to baseline and stable Postop Assessment: no apparent nausea or vomiting Anesthetic complications: no   No notable events documented.  Last Vitals:  Vitals:   08/01/23 1200 08/01/23 1215  BP: (!) 154/80 (!) 153/78  Pulse: 86 86  Resp: 15 13  Temp:  37.2 C  SpO2: 96% 96%    Last Pain:  Vitals:   08/01/23 1145  TempSrc:   PainSc: 0-No pain                 Lannie Fields

## 2023-08-01 NOTE — Progress Notes (Signed)
   08/01/23 2211  Vitals  Temp 98.7 F (37.1 C)  Pulse Rate 100  Resp (!) 26  BP (!) 157/81  SpO2 96 %  Post Treatment  Dialyzer Clearance Clear  Hemodialysis Intake (mL) 0 mL  Liters Processed 33.4  Fluid Removed (mL) 1000 mL  Tolerated HD Treatment Yes   Received patient in bed to unit.  Alert and oriented.  Informed consent signed and in chart.   TX duration:2hrs  Patient tolerated well.  Transported back to the room  Alert, without acute distress.  Hand-off given to patient's nurse.   Access used: [facility] Access issues: none  Total UF removed: 1L Medication(s) given: none    [name]

## 2023-08-01 NOTE — Transfer of Care (Signed)
Immediate Anesthesia Transfer of Care Note  Patient: Carol [name]  Procedure(s) Performed: LEFT ARM BRACHIOBASILIC FISTULA CREATION (Left: Arm Upper) INSERTION OF TUNNELED DIALYSIS CATHETER (Right: Neck)  Patient Location: PACU  Anesthesia Type:General  Level of Consciousness: drowsy and patient cooperative  Airway & Oxygen Therapy: Patient Spontanous Breathing  Post-op Assessment: Report given to RN and Post -op Vital signs reviewed and stable  Post vital signs: Reviewed and stable  Last Vitals:  Vitals Value Taken Time  BP 150/90 08/01/23 1145  Temp    Pulse 87 08/01/23 1147  Resp 16 08/01/23 1147  SpO2 96 % 08/01/23 1147  Vitals shown include unfiled device data.  Last Pain:  Vitals:   08/01/23 0845  TempSrc:   PainSc: 0-No pain      Patients Stated Pain Goal: 3 (07/31/23 0501)  Complications: No notable events documented.

## 2023-08-01 NOTE — Anesthesia Preprocedure Evaluation (Signed)
Anesthesia Evaluation  Patient identified by MRN, date of birth, ID band Patient awake    Reviewed: Allergy & Precautions, NPO status , Patient's Chart, lab work & pertinent test results  Airway Mallampati: II  TM Distance: >3 FB Neck ROM: Full    Dental no notable dental hx. (+) Missing, Poor Dentition,    Pulmonary Current Smoker and Patient abstained from smoking.   Pulmonary exam normal breath sounds clear to auscultation       Cardiovascular hypertension, + CAD, + Past MI (NSTEMI 01/2023) and +CHF  Normal cardiovascular exam Rhythm:Regular Rate:Normal  02/2023 TTE  1. Left ventricular ejection fraction, by estimation, is 60 to 65%. The  left ventricle has normal function. The left ventricle has no regional  wall motion abnormalities. There is mild asymmetric left ventricular  hypertrophy of the basal-septal segment.  Left ventricular diastolic parameters are consistent with Grade II  diastolic dysfunction (pseudonormalization).   2. Right ventricular systolic function is normal. The right ventricular  size is normal. There is moderately elevated pulmonary artery systolic  pressure. The estimated right ventricular systolic pressure is 49.5 mmHg.   3. Left atrial size was moderately dilated.   4. The mitral valve is normal in structure. Mild mitral valve  regurgitation. No evidence of mitral stenosis.   5. The aortic valve is tricuspid. There is mild calcification of the  aortic valve. Aortic valve regurgitation is trivial. Aortic valve  sclerosis/calcification is present, without any evidence of aortic  stenosis.   6. The inferior vena cava is normal in size with <50% respiratory  variability, suggesting right atrial pressure of 8 mmHg.      Neuro/Psych  PSYCHIATRIC DISORDERS Anxiety Depression       GI/Hepatic ,GERD  Medicated and Controlled,,  Endo/Other  diabetes    Renal/GU ESRFRenal diseaseLab Results       Component                Value               Date                      NA                       135                 08/01/2023                CL                       106                 08/01/2023                K                        3.8                 08/01/2023                CO2                      19 (L)              08/01/2023                BUN  45 (H)              08/01/2023                CREATININE               7.49 (H)            08/01/2023                GFRNONAA                 6 (L)               08/01/2023                CALCIUM                  7.5 (L)             08/01/2023                PHOS                     5.8 (H)             08/01/2023                ALBUMIN                  1.5 (L)             08/01/2023                GLUCOSE                  77                  08/01/2023                Musculoskeletal   Abdominal   Peds  Hematology  (+) Blood dyscrasia, anemia   Anesthesia Other Findings   Reproductive/Obstetrics                              Anesthesia Physical Anesthesia Plan  ASA: 4  Anesthesia Plan: General   Post-op Pain Management:    Induction: Intravenous  PONV Risk Score and Plan: Treatment may vary due to age or medical condition, Midazolam and Ondansetron  Airway Management Planned: LMA  Additional Equipment: None  Intra-op Plan:   Post-operative Plan: Extubation in OR  Informed Consent: I have reviewed the patients History and Physical, chart, labs and discussed the procedure including the risks, benefits and alternatives for the proposed anesthesia with the patient or authorized representative who has indicated his/her understanding and acceptance.     Dental advisory given  Plan Discussed with: CRNA and Anesthesiologist  Anesthesia Plan Comments:          Anesthesia Quick Evaluation

## 2023-08-02 ENCOUNTER — Inpatient Hospital Stay (HOSPITAL_COMMUNITY): Payer: 59

## 2023-08-02 ENCOUNTER — Encounter (HOSPITAL_COMMUNITY): Payer: Self-pay | Admitting: Vascular Surgery

## 2023-08-02 DIAGNOSIS — I161 Hypertensive emergency: Secondary | ICD-10-CM | POA: Diagnosis not present

## 2023-08-02 DIAGNOSIS — I5032 Chronic diastolic (congestive) heart failure: Secondary | ICD-10-CM

## 2023-08-02 LAB — CBC WITH DIFFERENTIAL/PLATELET
Abs Immature Granulocytes: 0.03 10*3/uL (ref 0.00–0.07)
Basophils Absolute: 0 10*3/uL (ref 0.0–0.1)
Basophils Relative: 0 %
Eosinophils Absolute: 0.8 10*3/uL — ABNORMAL HIGH (ref 0.0–0.5)
Eosinophils Relative: 11 %
HCT: 23.7 % — ABNORMAL LOW (ref 36.0–46.0)
Hemoglobin: 8.1 g/dL — ABNORMAL LOW (ref 12.0–15.0)
Immature Granulocytes: 0 %
Lymphocytes Relative: 18 %
Lymphs Abs: 1.2 10*3/uL (ref 0.7–4.0)
MCH: 29 pg (ref 26.0–34.0)
MCHC: 34.2 g/dL (ref 30.0–36.0)
MCV: 84.9 fL (ref 80.0–100.0)
Monocytes Absolute: 0.9 10*3/uL (ref 0.1–1.0)
Monocytes Relative: 14 %
Neutro Abs: 3.8 10*3/uL (ref 1.7–7.7)
Neutrophils Relative %: 57 %
Platelets: 217 10*3/uL (ref 150–400)
RBC: 2.79 MIL/uL — ABNORMAL LOW (ref 3.87–5.11)
RDW: 15.3 % (ref 11.5–15.5)
WBC: 6.7 10*3/uL (ref 4.0–10.5)
nRBC: 0 % (ref 0.0–0.2)

## 2023-08-02 LAB — IRON AND TIBC
Iron: 24 ug/dL — ABNORMAL LOW (ref 28–170)
Saturation Ratios: 13 % (ref 10.4–31.8)
TIBC: 183 ug/dL — ABNORMAL LOW (ref 250–450)
UIBC: 159 ug/dL

## 2023-08-02 LAB — ECHOCARDIOGRAM COMPLETE
AR max vel: 2.33 cm2
AV Area VTI: 2.58 cm2
AV Area mean vel: 2.27 cm2
AV Mean grad: 10 mm[Hg]
AV Peak grad: 14.7 mm[Hg]
Ao pk vel: 1.92 m/s
Area-P 1/2: 3.07 cm2
Height: 64 in
Weight: 2888.91 [oz_av]

## 2023-08-02 LAB — FOLATE: Folate: 5.3 ng/mL — ABNORMAL LOW (ref >5.9)

## 2023-08-02 LAB — COMPREHENSIVE METABOLIC PANEL
ALT: 7 U/L (ref 0–44)
AST: 13 U/L — ABNORMAL LOW (ref 15–41)
Albumin: <1.5 g/dL — ABNORMAL LOW (ref 3.5–5.0)
Alkaline Phosphatase: 95 U/L (ref 38–126)
Anion gap: 8 (ref 5–15)
BUN: 33 mg/dL — ABNORMAL HIGH (ref 6–20)
CO2: 22 mmol/L (ref 22–32)
Calcium: 7.3 mg/dL — ABNORMAL LOW (ref 8.9–10.3)
Chloride: 102 mmol/L (ref 98–111)
Creatinine, Ser: 5.57 mg/dL — ABNORMAL HIGH (ref 0.44–1.00)
GFR, Estimated: 8 mL/min — ABNORMAL LOW (ref >60)
Glucose, Bld: 122 mg/dL — ABNORMAL HIGH (ref 70–99)
Potassium: 3.5 mmol/L (ref 3.5–5.1)
Sodium: 132 mmol/L — ABNORMAL LOW (ref 135–145)
Total Bilirubin: 0.5 mg/dL (ref <1.2)
Total Protein: 4.8 g/dL — ABNORMAL LOW (ref 6.5–8.1)

## 2023-08-02 LAB — TYPE AND SCREEN
ABO/RH(D): B POS
Antibody Screen: NEGATIVE

## 2023-08-02 LAB — ABO/RH: ABO/RH(D): B POS

## 2023-08-02 LAB — MAGNESIUM: Magnesium: 1.5 mg/dL — ABNORMAL LOW (ref 1.7–2.4)

## 2023-08-02 LAB — PHOSPHORUS: Phosphorus: 3.9 mg/dL (ref 2.5–4.6)

## 2023-08-02 LAB — FERRITIN: Ferritin: 331 ng/mL — ABNORMAL HIGH (ref 11–307)

## 2023-08-02 LAB — PARATHYROID HORMONE, INTACT (NO CA): PTH: 234 pg/mL — ABNORMAL HIGH (ref 15–65)

## 2023-08-02 LAB — VITAMIN B12: Vitamin B-12: 356 pg/mL (ref 180–914)

## 2023-08-02 MED ORDER — IRON SUCROSE 200 MG IVPB - SIMPLE MED
200.0000 mg | Status: DC
  Filled 2023-08-02: qty 110

## 2023-08-02 MED ORDER — PERFLUTREN LIPID MICROSPHERE
1.0000 mL | INTRAVENOUS | Status: AC | PRN
  Administered 2023-08-02: 2 mL via INTRAVENOUS

## 2023-08-02 MED ORDER — MAGNESIUM OXIDE -MG SUPPLEMENT 400 (240 MG) MG PO TABS
400.0000 mg | ORAL_TABLET | Freq: Every day | ORAL | Status: DC
  Administered 2023-08-02 – 2023-08-05 (×4): 400 mg via ORAL
  Filled 2023-08-02 (×4): qty 1

## 2023-08-02 MED ORDER — FOLIC ACID 1 MG PO TABS
1.0000 mg | ORAL_TABLET | Freq: Every day | ORAL | Status: DC
Start: 2023-08-02 — End: 2023-08-05
  Administered 2023-08-02 – 2023-08-05 (×4): 1 mg via ORAL
  Filled 2023-08-02 (×4): qty 1

## 2023-08-02 MED ORDER — VITAMIN B-12 1000 MCG PO TABS
1000.0000 ug | ORAL_TABLET | Freq: Every day | ORAL | Status: DC
  Administered 2023-08-02 – 2023-08-05 (×4): 1000 ug via ORAL
  Filled 2023-08-02 (×4): qty 1

## 2023-08-02 MED ORDER — SODIUM CHLORIDE 0.9 % IV SOLN
200.0000 mg | INTRAVENOUS | Status: DC
  Administered 2023-08-02 – 2023-08-05 (×4): 200 mg via INTRAVENOUS
  Filled 2023-08-02 (×6): qty 10

## 2023-08-02 NOTE — Progress Notes (Signed)
Nephrology Follow-Up Consult note   Assessment/Recommendations: Carol [name] is a/an 62 y.o. female with a past medical history significant for DM2, CHF, COPD, CKD5, admitted for ESRD.       ESRD: progressive CKD V now ESRD. -s/p TDC and LUE brachiobasilic fistula creation 12/12, appreciate VVS -1L removed during HD 12/12, additional sessions today and tomorrow. UOP -clip  -Continue to monitor daily Cr, Dose meds for GFR -Monitor Daily I/Os, Daily weight  -Avoid nephrotoxic medications including NSAIDs -Use synthetic opioids (Fentanyl/Dilaudid) if needed   Metabolic acidosis: Secondary to CKD.  Improved with renal replacement therapy   Anemia of CKD: Hemoglobin 8.1. Started ESA   Secondary hyperparathyroidism:  Follow-up PTH   Hypertension: Has improved as cocaine wears off.  Continue current medications   Cocaine use disorder: Counseled on cessation    Carol [name] FM PGY2 [facility] 08/02/2023 8:59 AM  ___________________________________________________________  CC: Chest pain  Interval History/Subjective: Feeling better today. Denies acute concerns though reports that she is having some financial and social conflicts with her daughter.   Medications:  Current Facility-Administered Medications  Medication Dose Route Frequency Provider Last Rate Last Admin   acetaminophen (TYLENOL) tablet 650 mg  650 mg Oral Q6H PRN Clydie Braun, MD       Or   acetaminophen (TYLENOL) suppository 650 mg  650 mg Rectal Q6H PRN Madelyn Flavors A, MD       albuterol (PROVENTIL) (2.5 MG/3ML) 0.083% nebulizer solution 2.5 mg  2.5 mg Nebulization Q6H PRN Katrinka Blazing, Rondell A, MD       amLODipine (NORVASC) tablet 10 mg  10 mg Oral Daily Smith, Rondell A, MD   10 mg at 08/02/23 0855   arformoterol (BROVANA) nebulizer solution 15 mcg  15 mcg Nebulization BID Madelyn Flavors A, MD   15 mcg at 08/02/23 5784   aspirin EC tablet 81 mg  81 mg Oral Daily Smith, Rondell  A, MD   81 mg at 08/02/23 0855   atorvastatin (LIPITOR) tablet 80 mg  80 mg Oral Daily Smith, Rondell A, MD   80 mg at 08/02/23 0855   budesonide (PULMICORT) nebulizer solution 0.5 mg  0.5 mg Nebulization BID Madelyn Flavors A, MD   0.5 mg at 08/02/23 6962   calcitRIOL (ROCALTROL) capsule 0.25 mcg  0.25 mcg Oral Daily Katrinka Blazing, Rondell A, MD   0.25 mcg at 08/02/23 0855   Chlorhexidine Gluconate Cloth 2 % PADS 6 each  6 each Topical Q0600 Darnell Level, MD   6 each at 08/02/23 0524   cyanocobalamin (VITAMIN B12) tablet 1,000 mcg  1,000 mcg Oral Daily Zigmund Daniel., MD   1,000 mcg at 08/02/23 9528   Darbepoetin Alfa (ARANESP) injection 60 mcg  60 mcg Subcutaneous Q Thu-1800 Darnell Level, MD   60 mcg at 08/01/23 2305   folic acid (FOLVITE) tablet 1 mg  1 mg Oral Daily Zigmund Daniel., MD   1 mg at 08/02/23 0855   gabapentin (NEURONTIN) capsule 100 mg  100 mg Oral BID Madelyn Flavors A, MD   100 mg at 08/02/23 0855   heparin injection 5,000 Units  5,000 Units Subcutaneous Q8H Madelyn Flavors A, MD   5,000 Units at 08/02/23 0524   hydrALAZINE (APRESOLINE) injection 10 mg  10 mg Intravenous Q4H PRN Madelyn Flavors A, MD   10 mg at 07/31/23 0940   hydrALAZINE (APRESOLINE) tablet 100 mg  100 mg Oral Q8H Smith, Loreta Ave, MD   100  mg at 08/02/23 0525   iron sucrose (VENOFER) 200 mg in sodium chloride 0.9 % 100 mL IVPB  200 mg Intravenous Q24H Zigmund Daniel., MD       linagliptin (TRADJENTA) tablet 5 mg  5 mg Oral QHS Smith, Rondell A, MD   5 mg at 08/01/23 2306   loratadine (CLARITIN) tablet 10 mg  10 mg Oral Daily Madelyn Flavors A, MD   10 mg at 08/02/23 0856   melatonin tablet 5 mg  5 mg Oral QHS PRN Madelyn Flavors A, MD   5 mg at 08/01/23 2307   naphazoline-pheniramine (NAPHCON-A) 0.025-0.3 % ophthalmic solution 2 drop  2 drop Both Eyes BID Madelyn Flavors A, MD   2 drop at 08/02/23 0856   nitroGLYCERIN (NITROSTAT) SL tablet 0.4 mg  0.4 mg Sublingual Q5 min PRN Achille Rich, PA-C    0.4 mg at 07/30/23 2246   nitroGLYCERIN 50 mg in dextrose 5 % 250 mL (0.2 mg/mL) infusion  0-200 mcg/min Intravenous Titrated Achille Rich, PA-C   Stopped at 07/31/23 1519   oxyCODONE-acetaminophen (PERCOCET/ROXICET) 5-325 MG per tablet 1 tablet  1 tablet Oral Q4H PRN Clydie Braun, MD   1 tablet at 08/02/23 1610   And   oxyCODONE (Oxy IR/ROXICODONE) immediate release tablet 5 mg  5 mg Oral Q4H PRN Madelyn Flavors A, MD   5 mg at 08/02/23 0557   sodium chloride flush (NS) 0.9 % injection 3 mL  3 mL Intravenous Q12H Smith, Rondell A, MD   3 mL at 08/02/23 0857   triamcinolone ointment (KENALOG) 0.1 % 1 Application  1 Application Topical BID PRN Clydie Braun, MD       trimethobenzamide Dorothey Baseman) injection 200 mg  200 mg Intramuscular Q6H PRN Madelyn Flavors A, MD       zolpidem (AMBIEN) tablet 5 mg  5 mg Oral QHS PRN Madelyn Flavors A, MD          Review of Systems: 10 systems reviewed and negative except per interval history/subjective  Physical Exam: Vitals:   08/02/23 0525 08/02/23 0827  BP: (!) 162/76   Pulse:  97  Resp: 20 (!) 21  Temp: 99 F (37.2 C)   SpO2: 97%    No intake/output data recorded.  Intake/Output Summary (Last 24 hours) at 08/02/2023 0859 Last data filed at 08/02/2023 0617 Gross per 24 hour  Intake 1130 ml  Output 1210 ml  Net -80 ml    Constitutional: well-appearing, no acute distress ENMT: ears and nose without scars or lesions CV: normal rate, trace pitting BLE Respiratory: clear to auscultation, normal work of breathing Gastrointestinal: soft, non-tender, no palpable masses or hernias Skin: no visible lesions or rashes Psych: alert, judgement/insight appropriate, appropriate mood and affect   Test Results I personally reviewed new and old clinical labs and radiology tests Lab Results  Component Value Date   NA 132 (L) 08/02/2023   K 3.5 08/02/2023   CL 102 08/02/2023   CO2 22 08/02/2023   BUN 33 (H) 08/02/2023   CREATININE 5.57 (H)  08/02/2023   GFR 61.90 07/30/2014   CALCIUM 7.3 (L) 08/02/2023   ALBUMIN <1.5 (L) 08/02/2023   PHOS 3.9 08/02/2023    CBC Recent Labs  Lab 07/30/23 2236 08/01/23 0234 08/02/23 0301  WBC 5.7 5.0 6.7  NEUTROABS 3.6  --  3.8  HGB 10.9* 8.3* 8.1*  HCT 32.7* 24.6* 23.7*  MCV 85.4 85.7 84.9  PLT 241 223 217

## 2023-08-02 NOTE — Plan of Care (Signed)

## 2023-08-02 NOTE — Progress Notes (Signed)
Requested to see pt for out-pt HD needs at d/c. Met with pt at bedside. Introduced self and explained role. Discussed out-pt HD options. Pt lives really close to [facility] and really wants to go to this clinic if possible. Discussed TCU as possible option for education on HD and education on home therapy options. Pt agreeable to TCU referral for education (in-center at [facility] is at capacity). Pt aware and agreeable to HD treatments on Mon, Tues, Thurs, Fri at [place]. Pt states she drives some but if she cannot drive self that her sister can assist. Referral submitted to [facility] admissions this afternoon for review. Will assist as needed.   Olivia Canter Renal Navigator [phone]

## 2023-08-02 NOTE — Progress Notes (Signed)
PROGRESS NOTE    Maidie Gerk  NFA:[id number] DOB: Apr 12, 1961 DOA: 07/30/2023 PCP: [facility]  Chief Complaint  Patient presents with   Shortness of Breath   Chest Pain    Brief Narrative:   Carol [name] is Castor Gittleman 62 y.o. female with medical history significant of hypertension, diastolic congestive heart failure, COPD, diabetes mellitus type 2, sarcoidosis of the liver, chronic kidney disease stage V, chronic back pain, polysubstance abuse (including cocaine, alcohol, tobacco), and GERD who presents with complaints of shortness of breath and chest pain.  Symptoms started yesterday evening after she has mild cocaine prior.  She reported having pain across her chest with shortness of breath and intermittently productive cough.  Denies having any recent fevers, nausea, vomiting, or diarrhea.   Records note she had been hospitalized back in August 2024 for CHF and at that time her creatinine was noted to be 4.87 and had been sent home on torosemide.  She reports that she was seen by nephrology once in the outpatient setting after that hospitalization, but had not been back to them thereafter.  She reports being on chronic pain medicine due to history of injury to her back several years ago.  Patient has continued to be able to make urine.   In the emergency department patient was noted to be afebrile with respirations elevated to 32, blood pressures elevated up to 252/107, and all other vital signs relatively maintained.  Labs from 12/10 significant for hemoglobin 10.9, CO2 18, BUN 46, creatinine 7.14, anion gap 18, BUN 2.3, lactic acid 0.8, BNP 623.8, high-sensitivity troponins 39->45-> 49.  Chest x-ray noted heart at the upper limit of normal, but was otherwise did not note any acute abnormality.  Urinalysis did not note significant concern for infection.  UDS was positive for cocaine.  Nephrology have been formally consulted.  Patient having given Tylenol 650 mg p.o.,  nitroglycerin 0.4 mg sublingual, and noted on Sereena Marando nitroglycerin drip.  Nephrology have been consulted due to patient's worsening kidney function.  Assessment & Plan:   Principal Problem:   Chest pain Active Problems:   Elevated troponin   Hypertensive emergency   Acute renal failure superimposed on stage 5 chronic kidney disease, not on chronic dialysis (HCC)   Chronic diastolic CHF (congestive heart failure) (HCC)   borderline Prolonged QT interval   Type 2 diabetes mellitus with hyperlipidemia (HCC)   Metabolic acidosis, increased anion gap   Anemia of chronic disease   Chronic pain   Polysubstance abuse (HCC)  Chest pain Elevated troponin Acute.  Patient present with complaints of chest pain after using cocaine.  Chest x-ray noted heart size at the upper limit of normal, but did not note any acute abnormality.  High-sensitivity troponin 39->45-> 49-> 48.  EKG without significant ischemic changes. Patient with prior cardiac MRI which noted study was more consistent with Nafis Farnan small prior RCA territory infarct and cardiac sarcoidosis in 06/2022. Suspect secondary to demand secondary to cocaine use. -Admit to progressive bed -Follow-up echocardiogram -> notable for LVOT obstruction with flow acceleration -> cards recommending considering cardiac MRI to evaluate etiology of LV wall thickening (will discuss with cardiology -> she had cardiac MRI 06/2022)    Hypertensive emergency -Continue amlodipine, hydralazine, and Imdur -Hydralazine IV prn -follow with dialysis    Acute kidney injury superimposed on chronic kidney disease stage V On admission creatinine noted to be 7.14 with BUN 46.  Baseline creatinine previously had been around 4 when last checked in August  of this year.  Nephrology have been formally consulted due to worsening kidney function who had also consulted vascular surgery for access. -s/p tunneled dialysis catheter and left arm first stage brachiobasilic fistula creation  -  ready for discharge from vascular standpoint -plan for dialysis 12/12, 12/13, and 12/14.  Clip.     Diastolic congestive heart failure Chronic.  On physical exam patient appears relatively euvolemic besides some mild lower extremity edema.  Chest x-ray did not note any acute abnormality.  BNP 623.8 which appears lower than prior admission in August of this year.  Last EF noted to be 60 to 65% with grade 2 diastolic dysfunction and moderately dilated left atrium when checked on 03/08/2023. -repeat echo as above  -Renal/carb modified diet with fluid restriction   Prolonged QT interval Acute on chronic.  -Caution with qt prolonging meds -Correct electrolyte abnormalities -trend EKG   Controlled diabetes mellitus type 2, without long-term use of insulin On admission glucose 121.  Last hemoglobin A1c 5.7 on 8//24.  Home medication regimen includes Tradjenta. -Hypoglycemic protocols -Continue Tradjenta -no need for BG monitoring   Hyperlipidemia -Continue atorvastatin   Metabolic acidosis with increased anion gap Improved, continued non anion gap acidosis, will monitor    Anemia of chronic disease Will trend Follow anemia labs   Chronic pain Patient is on chronic opioids due to history of back pain related to injury several years ago -Continue oxycodone as needed caution with renal disease   Polysubstance abuse Patient admitted to using cocaine prior to arrival.  UDS positive for cocaine.  Patient does not drink alcohol on Layza Summa regular basis and only intermittently smokes cigarettes. -Transitions of care consulted for substance abuse    DVT prophylaxis: heparin Code Status: full Family Communication: none Disposition:   Status is: Inpatient Remains inpatient appropriate because: dispo per renal   Consultants:  Nephrology Vascular   Procedures:  12/12 PROCEDURE:   Ultrasound and fluoroscopic guided tunneled dialysis catheter placement Left arm first stage brachiobasilic  fistula creation  Antimicrobials:  Anti-infectives (From admission, onward)    None       Subjective: No complaints  Objective: Vitals:   08/02/23 1537 08/02/23 1600 08/02/23 1630 08/02/23 1700  BP: (!) 160/101 (!) 159/75 (!) 166/78 (!) 165/72  Pulse: 97 92 98 98  Resp: (!) 22 19 19  (!) 25  Temp:      TempSrc:      SpO2: 98% 97% 97% 97%  Weight:      Height:        Intake/Output Summary (Last 24 hours) at 08/02/2023 1715 Last data filed at 08/02/2023 0617 Gross per 24 hour  Intake 480 ml  Output 1200 ml  Net -720 ml   Filed Weights   08/01/23 0838 08/01/23 1954 08/02/23 0500  Weight: 77.1 kg 82.9 kg 81.9 kg    Examination:  General: No acute distress. Cardiovascular: RRR Lungs: unlabored Neurological: Alert and oriented 3. Moves all extremities 4. Cranial nerves II through XII grossly intact.   Data Reviewed: I have personally reviewed following labs and imaging studies  CBC: Recent Labs  Lab 07/30/23 2236 08/01/23 0234 08/02/23 0301  WBC 5.7 5.0 6.7  NEUTROABS 3.6  --  3.8  HGB 10.9* 8.3* 8.1*  HCT 32.7* 24.6* 23.7*  MCV 85.4 85.7 84.9  PLT 241 223 217    Basic Metabolic Panel: Recent Labs  Lab 07/30/23 2236 07/31/23 1136 08/01/23 0234 08/02/23 0301  NA 138 135 135 132*  K 4.1 3.4* 3.8  3.5  CL 102 104 106 102  CO2 18* 17* 19* 22  GLUCOSE 121* 147* 77 122*  BUN 46* 41* 45* 33*  CREATININE 7.14* 7.10* 7.49* 5.57*  CALCIUM 8.2* 7.7* 7.5* 7.3*  MG  --  1.4*  --  1.5*  PHOS  --  5.2* 5.8* 3.9    GFR: Estimated Creatinine Clearance: 11.1 mL/min (Sharie Amorin) (by C-G formula based on SCr of 5.57 mg/dL (H)).  Liver Function Tests: Recent Labs  Lab 07/30/23 2236 07/31/23 1136 08/01/23 0234 08/02/23 0301  AST 33  --   --  13*  ALT 12  --   --  7  ALKPHOS 119  --   --  95  BILITOT 1.0  --   --  0.5  PROT 7.0  --   --  4.8*  ALBUMIN 2.3* 1.7* 1.5* <1.5*    CBG: Recent Labs  Lab 07/31/23 0829 08/01/23 0824 08/01/23 1152  GLUCAP 101*  80 88     No results found for this or any previous visit (from the past 240 hours).       Radiology Studies: ECHOCARDIOGRAM COMPLETE Result Date: 08/02/2023    ECHOCARDIOGRAM REPORT   Patient Name:   ANJELI SCHLESSER Date of Exam: 08/02/2023 Medical Rec #:  [id number]               Height:       64.0 in Accession #:    [id number]              Weight:       180.6 lb Date of Birth:  08/22/1960               BSA:          1.873 m Patient Age:    62 years                BP:           162/76 mmHg Patient Gender: F                       HR:           98 bpm. Exam Location:  Inpatient Procedure: 2D Echo, Cardiac Doppler and Color Doppler Indications:    CHF  History:        Patient has prior history of Echocardiogram examinations, most                 recent 03/08/2023. CHF, COPD; Risk Factors:Hypertension and                 Diabetes.  Sonographer:    Darlys Gales Referring Phys: [phone] Addilyn Satterwhite CALDWELL POWELL JR IMPRESSIONS  1. LVOT obstruction with flow acceleration, no definite SAM or SAM related MR. Max instantaneous gradient 21 mmHg, velocity 2.3 m/s. Consider cardiac MRI to further evaluate etiology of left ventricular wall thickening.  2. Left ventricular ejection fraction, by estimation, is 65 to 70%. The left ventricle has normal function. The left ventricle has no regional wall motion abnormalities. There is severe left ventricular hypertrophy. Indeterminate diastolic filling due to E-Tangie Stay fusion.  3. Right ventricular systolic function is normal. The right ventricular size is normal. Tricuspid regurgitation signal is inadequate for assessing PA pressure.  4. The mitral valve is grossly normal. Trivial mitral valve regurgitation. No evidence of mitral stenosis.  5. The aortic valve is tricuspid. Aortic valve regurgitation is not visualized. No aortic stenosis is present.  6. The  inferior vena cava is normal in size with greater than 50% respiratory variability, suggesting right atrial pressure of 3  mmHg. FINDINGS  Left Ventricle: Left ventricular ejection fraction, by estimation, is 65 to 70%. The left ventricle has normal function. The left ventricle has no regional wall motion abnormalities. The left ventricular internal cavity size was normal in size. There is  severe left ventricular hypertrophy. Indeterminate diastolic filling due to E-Kindsey Eblin fusion. Right Ventricle: The right ventricular size is normal. Right vetricular wall thickness was not well visualized. Right ventricular systolic function is normal. Tricuspid regurgitation signal is inadequate for assessing PA pressure. Left Atrium: Left atrial size was normal in size. Right Atrium: Right atrial size was normal in size. Pericardium: There is no evidence of pericardial effusion. Mitral Valve: The mitral valve is grossly normal. Trivial mitral valve regurgitation. No evidence of mitral valve stenosis. Tricuspid Valve: The tricuspid valve is normal in structure. Tricuspid valve regurgitation is trivial. No evidence of tricuspid stenosis. Aortic Valve: The aortic valve is tricuspid. Aortic valve regurgitation is not visualized. No aortic stenosis is present. Aortic valve mean gradient measures 10.0 mmHg. Aortic valve peak gradient measures 14.7 mmHg. Aortic valve area, by VTI measures 2.58 cm. Pulmonic Valve: The pulmonic valve was not well visualized. Pulmonic valve regurgitation is not visualized. No evidence of pulmonic stenosis. Aorta: The aortic root is normal in size and structure. Venous: The inferior vena cava is normal in size with greater than 50% respiratory variability, suggesting right atrial pressure of 3 mmHg. IAS/Shunts: No atrial level shunt detected by color flow Doppler.  LEFT VENTRICLE PLAX 2D LVIDd:         3.00 cm   Diastology LV PW:         1.77 cm   LV e' medial:    4.13 cm/s LV IVS:        1.60 cm   LV E/e' medial:  17.9 LVOT diam:     1.80 cm   LV e' lateral:   4.46 cm/s LV SV:         92        LV E/e' lateral: 16.6 LV SV Index:    49 LVOT Area:     2.54 cm  RIGHT VENTRICLE RV S prime:     16.40 cm/s TAPSE (M-mode): 4.3 cm LEFT ATRIUM             Index        RIGHT ATRIUM          Index LA Vol (A2C):   30.3 ml 16.18 ml/m  RA Area:     9.09 cm LA Vol (A4C):   64.5 ml 34.43 ml/m  RA Volume:   16.70 ml 8.92 ml/m LA Biplane Vol: 46.0 ml 24.56 ml/m  AORTIC VALVE AV Area (Vmax):    2.33 cm AV Area (Vmean):   2.27 cm AV Area (VTI):     2.58 cm AV Vmax:           192.00 cm/s AV Vmean:          156.000 cm/s AV VTI:            0.355 m AV Peak Grad:      14.7 mmHg AV Mean Grad:      10.0 mmHg LVOT Vmax:         176.00 cm/s LVOT Vmean:        139.000 cm/s LVOT VTI:          0.360  m LVOT/AV VTI ratio: 1.01 MITRAL VALVE MV Area (PHT): 3.07 cm     SHUNTS MV Decel Time: 247 msec     Systemic VTI:  0.36 m MV E velocity: 73.90 cm/s   Systemic Diam: 1.80 cm MV Jontae Adebayo velocity: 121.00 cm/s MV E/Lesbia Ottaway ratio:  0.61 Weston Brass MD Electronically signed by Weston Brass MD Signature Date/Time: 08/02/2023/3:14:51 PM    Final    DG CHEST PORT 1 VIEW Result Date: 08/01/2023 CLINICAL DATA:  Dialysis catheter in place. EXAM: PORTABLE CHEST 1 VIEW COMPARISON:  Chest radiograph dated 07/30/2023. FINDINGS: Right IJ dialysis catheter with tip at the cavoatrial junction. No pneumothorax. No focal consolidation, pleural effusion. Stable cardiac silhouette. No acute osseous pathology. IMPRESSION: Right IJ dialysis catheter with tip at the cavoatrial junction. No pneumothorax. Electronically Signed   By: Elgie Collard M.D.   On: 08/01/2023 15:56   HYBRID OR IMAGING ([facility] ONLY) Result Date: 08/01/2023 There is no interpretation for this exam.  This order is for images obtained during Jenya Putz surgical procedure.  Please See "Surgeries" Tab for more information regarding the procedure.        Scheduled Meds:  amLODipine  10 mg Oral Daily   arformoterol  15 mcg Nebulization BID   aspirin EC  81 mg Oral Daily   atorvastatin  80 mg Oral Daily   budesonide  (PULMICORT) nebulizer solution  0.5 mg Nebulization BID   calcitRIOL  0.25 mcg Oral Daily   Chlorhexidine Gluconate Cloth  6 each Topical Q0600   vitamin B-12  1,000 mcg Oral Daily   darbepoetin (ARANESP) injection - DIALYSIS  60 mcg Subcutaneous Q Thu-1800   folic acid  1 mg Oral Daily   gabapentin  100 mg Oral BID   heparin  5,000 Units Subcutaneous Q8H   hydrALAZINE  100 mg Oral Q8H   linagliptin  5 mg Oral QHS   loratadine  10 mg Oral Daily   magnesium oxide  400 mg Oral Daily   naphazoline-pheniramine  2 drop Both Eyes BID   sodium chloride flush  3 mL Intravenous Q12H   Continuous Infusions:  iron sucrose 200 mg (08/02/23 1048)   nitroGLYCERIN Stopped (07/31/23 1519)     LOS: 2 days    Time spent: over 30 min     Lacretia Nicks, MD [facility]   To contact the attending provider between 7A-7P or the covering provider during after hours 7P-7A, please log into the web site [email or web address] and access using universal [place] password for that web site. If you do not have the password, please call the hospital operator.  08/02/2023, 5:15 PM

## 2023-08-02 NOTE — Progress Notes (Signed)
BLE venous was attempted, however was told patient had to go to dialysis.  Will re attempt exam as patient availability and schedule permit.   Ernestene Mention, RVT/RDMS

## 2023-08-02 NOTE — Progress Notes (Signed)
  Progress Note    08/02/2023 7:45 AM 1 Day Post-Op  Subjective:  says her incision is sore    Vitals:   08/01/23 2339 08/02/23 0525  BP: (!) 157/68 (!) 162/76  Pulse: 98   Resp: 20 20  Temp: 99.4 F (37.4 C) 99 F (37.2 C)  SpO2: 98% 97%    Physical Exam: General:  sitting up in bed, alert and oriented x4 Lungs:  nonlabored Incisions:  left arm incision c/d/l  Extremities:  left brachiobasilic fistula with palpable thrill. Intact motor and sensation of LUE   CBC    Component Value Date/Time   WBC 6.7 08/02/2023 0301   RBC 2.79 (L) 08/02/2023 0301   HGB 8.1 (L) 08/02/2023 0301   HCT 23.7 (L) 08/02/2023 0301   PLT 217 08/02/2023 0301   MCV 84.9 08/02/2023 0301   MCH 29.0 08/02/2023 0301   MCHC 34.2 08/02/2023 0301   RDW 15.3 08/02/2023 0301   LYMPHSABS 1.2 08/02/2023 0301   MONOABS 0.9 08/02/2023 0301   EOSABS 0.8 (H) 08/02/2023 0301   BASOSABS 0.0 08/02/2023 0301    BMET    Component Value Date/Time   NA 132 (L) 08/02/2023 0301   K 3.5 08/02/2023 0301   CL 102 08/02/2023 0301   CO2 22 08/02/2023 0301   GLUCOSE 122 (H) 08/02/2023 0301   BUN 33 (H) 08/02/2023 0301   CREATININE 5.57 (H) 08/02/2023 0301   CREATININE 4.87 (H) 04/03/2023 1605   CALCIUM 7.3 (L) 08/02/2023 0301   GFRNONAA 8 (L) 08/02/2023 0301   GFRNONAA 43 (L) 11/21/2020 1451   GFRAA 50 (L) 11/21/2020 1451    INR    Component Value Date/Time   INR 1.1 03/22/2023 1154     Intake/Output Summary (Last 24 hours) at 08/02/2023 0745 Last data filed at 08/02/2023 0617 Gross per 24 hour  Intake 1130 ml  Output 1610 ml  Net -480 ml      Assessment/Plan:  62 y.o. female is 1 day post op, s/p: TDC placement and LUE brachiobasilic fistula creation    -Says she feels okay this morning. Her left arm incision is sore -Denies any symptoms of steal in the left hand. L hand is warm with intact motor and sensation -LUE basilic fistula with good thrill on palpation. Incision looks good  without hematoma -Ok for d/c from vascular standpoint. Will arrange follow up with our office in 4-6 weeks with fistula duplex   Loel Dubonnet PA-C [facility] [phone] 08/02/2023 7:45 AM

## 2023-08-02 NOTE — Progress Notes (Signed)
   08/02/23 1815  Vitals  Temp 98.7 F (37.1 C)  Pulse Rate 98  Resp (!) 26  BP (!) 174/88  SpO2 98 %  Post Treatment  Dialyzer Clearance Clear  Hemodialysis Intake (mL) 0 mL  Liters Processed 45  Fluid Removed (mL) 1000 mL  Tolerated HD Treatment Yes   Received patient in bed to unit.  Alert and oriented.  Informed consent signed and in chart.   TX duration:2.5hrs  Patient tolerated well.  Transported back to the room  Alert, without acute distress.  Hand-off given to patient's nurse.   Access used: [facility] Access issues: none  Total UF removed: 1L Medication(s) given: none   Carol [name] [facility]

## 2023-08-02 NOTE — Plan of Care (Signed)
  Problem: Education: Goal: Knowledge of General Education information will improve Description: Including pain rating scale, medication(s)/side effects and non-pharmacologic comfort measures Outcome: Progressing   Problem: Education: Goal: Knowledge of disease and its progression will improve Outcome: Progressing

## 2023-08-02 NOTE — TOC Initial Note (Addendum)
Transition of Care [facility]) - Initial/Assessment Note    Patient Details  Name: Carol [name] MRN: [id number] Date of Birth: Mar 19, 1961  [facility] CM/SW Contact:    Leone Haven, RN Phone Number: 08/02/2023, 5:32 PM  Clinical Narrative:                 From home alone, has PCP and insurance on file, states has no HH services in place at this time , has walker and a cane at home.  States her sister will transport her home at [employer] and she is support system, states gets medications from [facility] on [address].  Pta self ambulatory.  HD on MWF.   Expected Discharge Plan: Home/Self Care Barriers to Discharge: Continued Medical Work up   Patient Goals and CMS Choice Patient states their goals for this hospitalization and ongoing recovery are:: return home   Choice offered to / list presented to : NA      Expected Discharge Plan and Services   Discharge Planning Services: CM Consult Post Acute Care Choice: NA Living arrangements for the past 2 months: Apartment                 DME Arranged: N/A DME Agency: NA       HH Arranged: NA          Prior Living Arrangements/Services Living arrangements for the past 2 months: Apartment Lives with:: Self Patient language and need for interpreter reviewed:: Yes Do you feel safe going back to the place where you live?: Yes      Need for Family Participation in Patient Care: Yes (Comment) Care giver support system in place?: Yes (comment) Current home services: DME (walker, cane) Criminal Activity/Legal Involvement Pertinent to Current Situation/Hospitalization: No - Comment as needed  Activities of Daily Living      Permission Sought/Granted Permission sought to share information with : Case Manager                Emotional Assessment Appearance:: Appears stated age Attitude/Demeanor/Rapport: Engaged Affect (typically observed): Appropriate Orientation: : Oriented to Self, Oriented to  Place, Oriented to  Time, Oriented to Situation Psych Involvement: No (comment)  Admission diagnosis:  AKI (acute kidney injury) (HCC) [N17.9] Hypertensive emergency [I16.1] Chest pain [R07.9] Patient Active Problem List   Diagnosis Date Noted   Chest pain 07/31/2023   Hypertensive emergency 07/31/2023   Anemia of chronic disease 07/31/2023   Chronic pain 07/31/2023   Polysubstance abuse (HCC) 07/31/2023   COPD (chronic obstructive pulmonary disease) (HCC) 03/27/2023   Chronic anemia 03/27/2023   Acute on chronic systolic (congestive) heart failure (HCC) 03/08/2023   Volume overload 03/07/2023   Heme positive stool 11/06/2022   Anemia due to chronic blood loss 11/06/2022   Colon cancer screening 11/03/2022   Hypoglycemia 11/02/2022   MDD (major depressive disorder), recurrent episode, moderate (HCC) 11/02/2022   Abnormal barium swallow 11/02/2022   Esophageal stricture 11/02/2022   Esophageal dysmotility 11/02/2022   Antiplatelet or antithrombotic long-term use 11/02/2022   Chronic diastolic CHF (congestive heart failure) (HCC) 11/01/2022   Dysphagia 11/01/2022   Acute on chronic diastolic CHF (congestive heart failure) (HCC) 05/09/2022   Metabolic acidosis, increased anion gap 05/09/2022   Acute on chronic diastolic (congestive) heart failure (HCC) 03/22/2022   COPD not affecting current episode of care 03/22/2022   Class 2 obesity due to excess calories with body mass index (BMI) of 37.0 to 37.9 in adult 03/22/2022   CHF (  congestive heart failure) (HCC) 03/14/2022   borderline Prolonged QT interval 01/22/2022   Elevated troponin 01/22/2022   Thyroid nodule 01/22/2022   Cocaine use disorder, mild, abuse (HCC) 01/02/2022   Diabetic hypoglycemia (HCC) 12/31/2021   CKD (chronic kidney disease) stage 4, GFR 15-29 ml/min (HCC) 12/31/2021   CAD in native artery 12/24/2021   Anxiety with depression 12/24/2021   Pure hypercholesterolemia    Acute renal failure superimposed on  stage 5 chronic kidney disease, not on chronic dialysis (HCC) 01/29/2014   Type 2 diabetes mellitus with hyperlipidemia (HCC) 01/29/2014   Essential hypertension 01/29/2014   Closed jaw fracture (HCC) 01/29/2014   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   PCP:  [facility] Pharmacy:   [facility] [place], [place] - [address] [place] [address] Phone: [phone] Fax: [phone]  [facility] [address] [place] [address] Phone: [phone] Fax: [phone]     Social Drivers of Health (SDOH) Social History: SDOH Screenings   Food Insecurity: No Food Insecurity (07/31/2023)  Housing: Patient Unable To Answer (08/01/2023)  Transportation Needs: No Transportation Needs (07/31/2023)  Utilities: Not At Risk (07/31/2023)  Financial Resource Strain: Low Risk  (04/04/2022)  Tobacco Use: High Risk (08/01/2023)   SDOH Interventions:     Readmission Risk Interventions    08/02/2023    5:29 PM 03/25/2023    4:51 PM 03/27/2022   10:36 AM  Readmission Risk Prevention Plan  Transportation Screening Complete Complete Complete  Medication Review Oceanographer) Complete Complete Complete  PCP or Specialist appointment within 3-5 days of discharge   Complete  HRI or Home Care Consult Complete Complete Complete  SW Recovery Care/Counseling Consult   Complete  Palliative Care Screening Not Applicable Not Applicable Not Applicable  Skilled Nursing Facility Not Applicable Not Applicable Not Applicable

## 2023-08-03 ENCOUNTER — Inpatient Hospital Stay (HOSPITAL_COMMUNITY): Payer: 59

## 2023-08-03 DIAGNOSIS — R0789 Other chest pain: Secondary | ICD-10-CM

## 2023-08-03 DIAGNOSIS — N186 End stage renal disease: Secondary | ICD-10-CM

## 2023-08-03 DIAGNOSIS — I161 Hypertensive emergency: Secondary | ICD-10-CM | POA: Diagnosis not present

## 2023-08-03 DIAGNOSIS — E663 Overweight: Secondary | ICD-10-CM | POA: Insufficient documentation

## 2023-08-03 DIAGNOSIS — R609 Edema, unspecified: Secondary | ICD-10-CM

## 2023-08-03 DIAGNOSIS — I5032 Chronic diastolic (congestive) heart failure: Secondary | ICD-10-CM | POA: Diagnosis not present

## 2023-08-03 DIAGNOSIS — E1169 Type 2 diabetes mellitus with other specified complication: Secondary | ICD-10-CM | POA: Diagnosis not present

## 2023-08-03 LAB — AST: AST: 13 U/L — ABNORMAL LOW (ref 15–41)

## 2023-08-03 LAB — RENAL FUNCTION PANEL
Albumin: <1.5 g/dL — ABNORMAL LOW (ref 3.5–5.0)
Anion gap: 9 (ref 5–15)
BUN: 23 mg/dL — ABNORMAL HIGH (ref 6–20)
CO2: 24 mmol/L (ref 22–32)
Calcium: 7.8 mg/dL — ABNORMAL LOW (ref 8.9–10.3)
Chloride: 101 mmol/L (ref 98–111)
Creatinine, Ser: 4.44 mg/dL — ABNORMAL HIGH (ref 0.44–1.00)
GFR, Estimated: 11 mL/min — ABNORMAL LOW (ref >60)
Glucose, Bld: 94 mg/dL (ref 70–99)
Phosphorus: 3.9 mg/dL (ref 2.5–4.6)
Potassium: 3.7 mmol/L (ref 3.5–5.1)
Sodium: 134 mmol/L — ABNORMAL LOW (ref 135–145)

## 2023-08-03 LAB — CBC WITH DIFFERENTIAL/PLATELET
Abs Immature Granulocytes: 0.03 10*3/uL (ref 0.00–0.07)
Basophils Absolute: 0 10*3/uL (ref 0.0–0.1)
Basophils Relative: 0 %
Eosinophils Absolute: 0.9 10*3/uL — ABNORMAL HIGH (ref 0.0–0.5)
Eosinophils Relative: 14 %
HCT: 24.4 % — ABNORMAL LOW (ref 36.0–46.0)
Hemoglobin: 7.9 g/dL — ABNORMAL LOW (ref 12.0–15.0)
Immature Granulocytes: 1 %
Lymphocytes Relative: 21 %
Lymphs Abs: 1.3 10*3/uL (ref 0.7–4.0)
MCH: 28 pg (ref 26.0–34.0)
MCHC: 32.4 g/dL (ref 30.0–36.0)
MCV: 86.5 fL (ref 80.0–100.0)
Monocytes Absolute: 1.1 10*3/uL — ABNORMAL HIGH (ref 0.1–1.0)
Monocytes Relative: 17 %
Neutro Abs: 3 10*3/uL (ref 1.7–7.7)
Neutrophils Relative %: 47 %
Platelets: 192 10*3/uL (ref 150–400)
RBC: 2.82 MIL/uL — ABNORMAL LOW (ref 3.87–5.11)
RDW: 15 % (ref 11.5–15.5)
WBC: 6.4 10*3/uL (ref 4.0–10.5)
nRBC: 0 % (ref 0.0–0.2)

## 2023-08-03 LAB — GLUCOSE, CAPILLARY
Glucose-Capillary: 165 mg/dL — ABNORMAL HIGH (ref 70–99)
Glucose-Capillary: 142 mg/dL — ABNORMAL HIGH (ref 70–99)

## 2023-08-03 LAB — MAGNESIUM: Magnesium: 1.6 mg/dL — ABNORMAL LOW (ref 1.7–2.4)

## 2023-08-03 LAB — BILIRUBIN, TOTAL: Total Bilirubin: 0.5 mg/dL (ref <1.2)

## 2023-08-03 LAB — ALT: ALT: 6 U/L (ref 0–44)

## 2023-08-03 LAB — ALKALINE PHOSPHATASE: Alkaline Phosphatase: 96 U/L (ref 38–126)

## 2023-08-03 MED ORDER — ALTEPLASE 2 MG IJ SOLR: 2.0000 mg | Freq: Once | INTRAMUSCULAR | Status: DC | PRN

## 2023-08-03 MED ORDER — PENTAFLUOROPROP-TETRAFLUOROETH EX AERO: 1.0000 | INHALATION_SPRAY | CUTANEOUS | Status: DC | PRN

## 2023-08-03 MED ORDER — HEPARIN SODIUM (PORCINE) 1000 UNIT/ML DIALYSIS: 1000.0000 [IU] | INTRAMUSCULAR | Status: DC | PRN

## 2023-08-03 MED ORDER — HEPARIN SODIUM (PORCINE) 1000 UNIT/ML IJ SOLN
3200.0000 [IU] | Freq: Once | INTRAMUSCULAR | Status: AC
  Administered 2023-08-03: 3200 [IU]
  Filled 2023-08-03: qty 4

## 2023-08-03 MED ORDER — LIDOCAINE HCL (PF) 1 % IJ SOLN: 5.0000 mL | INTRAMUSCULAR | Status: DC | PRN

## 2023-08-03 MED ORDER — LIDOCAINE-PRILOCAINE 2.5-2.5 % EX CREA: 1.0000 | TOPICAL_CREAM | CUTANEOUS | Status: DC | PRN

## 2023-08-03 NOTE — Assessment & Plan Note (Signed)
Last hemoglobin 7.9, patient on Aranesp

## 2023-08-03 NOTE — Plan of Care (Signed)
  Problem: Education: Goal: Knowledge of General Education information will improve Description: Including pain rating scale, medication(s)/side effects and non-pharmacologic comfort measures Outcome: Progressing   Problem: Health Behavior/Discharge Planning: Goal: Ability to manage health-related needs will improve Outcome: Progressing   Problem: Clinical Measurements: Goal: Diagnostic test results will improve Outcome: Progressing   

## 2023-08-03 NOTE — Assessment & Plan Note (Signed)
Can potentially add beta-blocker

## 2023-08-03 NOTE — Progress Notes (Signed)
  Progress Note   Patient: Carol [name] ZOX:[id number] DOB: 1960-10-11 DOA: 07/30/2023     3 DOS: the patient was seen and examined on 08/03/2023   Brief hospital course: 62 year old female past medical history of hypertension, diastolic congestive heart failure, COPD, type 2 diabetes mellitus, sarcoidosis of the liver, chronic kidney disease, chronic back pain, polysubstance abuse, GERD presented with shortness of breath and chest pain.  Patient progressed to end-stage renal disease.  Patient had tunneled catheter placement and dialysis on 12/12, 12/13 and 12/14.  12/14.  Patient seen on dialysis.  Patient finished dialysis and vomited up medications.  Assessment and Plan: * ESRD (end stage renal disease) (HCC) Patient has a PermCath and completed 3 dialysis sessions here in the hospital.  Will need outpatient dialysis slot.  Patient vomited after dialysis today.  Hypertensive emergency Patient on Norvasc, hydralazine  Type 2 diabetes mellitus with hyperlipidemia (HCC) Hemoglobin A1c low at 5.7.  Will get rid of linagliptin and continue atorvastatin.  Check fingersticks.  Chronic diastolic CHF (congestive heart failure) (HCC) Dialysis will help out with fluid management.  Chest pain Likely cocaine related.  Elevated troponin Borderline elevation of troponin likely demand ischemia with cocaine use.  borderline Prolonged QT interval Can potentially add beta-blocker  Metabolic acidosis, increased anion gap Likely from progressed worsening kidney function.  Improved with dialysis.  Anemia of chronic disease Last hemoglobin 7.9, patient on Aranesp  Overweight (BMI 25.0-29.9) Last BMI 29.63        Subjective: Patient felt okay on dialysis.  Vomited after dialysis.  Initially admitted with chest pain and shortness of breath.  Physical Exam: Vitals:   08/03/23 1134 08/03/23 1141 08/03/23 1145 08/03/23 1300  BP: (!) 153/81 (!) 169/81  (!) 150/97  Pulse: 88 89     Resp: (!) 22 18  19   Temp: 98 F (36.7 C)   97.8 F (36.6 C)  TempSrc: Oral   Oral  SpO2: 97% 97%    Weight:   78.3 kg   Height:       Physical Exam HENT:     Head: Normocephalic.     Mouth/Throat:     Pharynx: No oropharyngeal exudate.  Eyes:     General: Lids are normal.     Conjunctiva/sclera: Conjunctivae normal.  Cardiovascular:     Rate and Rhythm: Normal rate and regular rhythm.     Heart sounds: Normal heart sounds, S1 normal and S2 normal.  Pulmonary:     Breath sounds: No decreased breath sounds, wheezing, rhonchi or rales.  Abdominal:     Palpations: Abdomen is soft.     Tenderness: There is no abdominal tenderness.  Musculoskeletal:     Right lower leg: No swelling.     Left lower leg: No swelling.  Skin:    General: Skin is warm.     Findings: No rash.  Neurological:     Mental Status: She is alert and oriented to person, place, and time.     Data Reviewed: Creatinine 4.44, albumin less than 1.5, sodium 134, ferritin 331, hemoglobin 7.9  Family Communication: Patient's daughter on the phone while I seen her  Disposition: Status is: Inpatient Remains inpatient appropriate because: Patient vomited after dialysis today.  Check labs again tomorrow.  Planned Discharge Destination: Home    Time spent: 28 minutes  Author: Alford Highland, MD 08/03/2023 2:29 PM  For on call review [email or web address].

## 2023-08-03 NOTE — Progress Notes (Signed)
VASCULAR LAB    Bilateral lower extremity venous duplex has been performed.  See CV proc for preliminary results.   Nollan Muldrow, RVT 08/03/2023, 6:17 PM

## 2023-08-03 NOTE — Assessment & Plan Note (Signed)
Last BMI 29.63

## 2023-08-03 NOTE — Assessment & Plan Note (Signed)
Borderline elevation of troponin likely demand ischemia with cocaine use.

## 2023-08-03 NOTE — Hospital Course (Signed)
Patient is a 62 year old female with hypertension, CHF, COPD, type 2 diabetes, sarcoidosis of the liver, CKD stage V, chronic back pain, polysubstance abuse including cocaine, alcohol, tobacco, and GERD.  She presents on this admission with shortness of breath and chest pain.  Patient did cocaine just prior to arrival.  On arrival patient was tachypneic with blood pressure 252/107.  Labs revealed significant worsening in her renal function and mildly elevated high-sensitivity troponins.  UDS was positive for cocaine.  Nephrology was consulted. Patient received a tunneled dialysis catheter on left arm for stage 1 brachiobasilic fistula creation via vascular surgery.  She was initiated on dialysis which she completed successfully on 12/12, 12/13, 12/14.  TOC was consulted to help with outpatient hemodialysis arrangements. Regarding patient's chest pain, EKG without any ischemic changes.  Patient had prior cardiac MRI 1 year ago.  Echocardiogram this admission was notable for LVOT obstruction with flow acceleration.  Cardiology recommended outpatient follow-up and will consider additional cardiac MRI at that time. Patient's hypertensive emergency responded to blood pressure medications appropriately.  It was certainly caused by cocaine use.  Patient was counseled extensively on the dangers of cocaine.  She endorses understanding. Patient did have intermittent hypoxia during this admission.  She completed a 6-minute walk test on 12/16 which revealed that she did not require long-term oxygen, she maintained sats at 97% on room air.  Patient was cleared to discharge from her specialist team.  She was given her dialysis arrangements outpatient and instructed on the importance of following up.

## 2023-08-03 NOTE — Progress Notes (Addendum)
Patient threw up immediately after taking all her daily medications. Patient requests to eat before all medication administrations.    1525 Patient threw up again this time when sitting up on the side of bed not after medication administration.

## 2023-08-03 NOTE — Progress Notes (Signed)
PT Cancellation Note  Patient Details Name: Carol [name] MRN: [id number] DOB: March 14, 1961   Cancelled Treatment:    Reason Eval/Treat Not Completed: Patient at procedure or test/unavailable (HD). Will follow up for PT Evaluation as schedule permits.  Ina Homes, PT, DPT Acute Rehabilitation Services  Personal: [facility] Office: [phone]  Malachy Chamber 08/03/2023, 11:32 AM

## 2023-08-03 NOTE — Assessment & Plan Note (Signed)
Patient on Norvasc, hydralazine

## 2023-08-03 NOTE — Assessment & Plan Note (Addendum)
Likely from progressed worsening kidney function.  Improved with dialysis.

## 2023-08-03 NOTE — Assessment & Plan Note (Addendum)
Hemoglobin A1c low at 5.7.  Will get rid of linagliptin and continue atorvastatin.  Check fingersticks.

## 2023-08-03 NOTE — Assessment & Plan Note (Signed)
Likely cocaine related.

## 2023-08-03 NOTE — Progress Notes (Signed)
Nephrology Follow-Up Consult note   Assessment/Recommendations: Carol [name] is a/an 62 y.o. female with a past medical history significant for DM2, CHF, COPD, CKD5, admitted for ESRD.      ESRD: progressive CKD V now ESRD. -Status post TDC and first stage BB AVF on 12/12; appreciate help -plan for HD today then onto TTS schedule -CLIP initiated; likely TCU -Continue to monitor daily Cr, Dose meds for GFR -Monitor Daily I/Os, Daily weight  -Avoid nephrotoxic medications including NSAIDs -Use synthetic opioids (Fentanyl/Dilaudid) if needed   Anemia of CKD: Hemoglobin low.  Repeat iron stores demonstrate deficiency.  IV iron ordered.  ESA on board  Secondary hyperparathyroidism: PTH 234.  Calcium corrects to normal.  Phosphorus normal  Hypertension: Blood pressure has improved.  Continue current medications  Cocaine use disorder: Counseled on cessation  Anxiety: chaplain services requested   Recommendations conveyed to primary service.    Darnell Level [facility] 08/03/2023 8:20 AM  ___________________________________________________________  CC: Chest pain  Interval History/Subjective: Patient with labile mood concerned about relations with her daughter.  No physical complaints.  Tolerating dialysis  Medications:  Current Facility-Administered Medications  Medication Dose Route Frequency Provider Last Rate Last Admin   acetaminophen (TYLENOL) tablet 650 mg  650 mg Oral Q6H PRN Clydie Braun, MD       Or   acetaminophen (TYLENOL) suppository 650 mg  650 mg Rectal Q6H PRN Katrinka Blazing, Rondell A, MD       albuterol (PROVENTIL) (2.5 MG/3ML) 0.083% nebulizer solution 2.5 mg  2.5 mg Nebulization Q6H PRN Smith, Rondell A, MD       alteplase (CATHFLO ACTIVASE) injection 2 mg  2 mg Intracatheter Once PRN Darnell Level, MD       amLODipine (NORVASC) tablet 10 mg  10 mg Oral Daily Katrinka Blazing, Rondell A, MD   10 mg at 08/02/23 0855   arformoterol  (BROVANA) nebulizer solution 15 mcg  15 mcg Nebulization BID Madelyn Flavors A, MD   15 mcg at 08/02/23 2027   aspirin EC tablet 81 mg  81 mg Oral Daily Smith, Rondell A, MD   81 mg at 08/02/23 0855   atorvastatin (LIPITOR) tablet 80 mg  80 mg Oral Daily Smith, Rondell A, MD   80 mg at 08/02/23 0855   budesonide (PULMICORT) nebulizer solution 0.5 mg  0.5 mg Nebulization BID Madelyn Flavors A, MD   0.5 mg at 08/02/23 2027   calcitRIOL (ROCALTROL) capsule 0.25 mcg  0.25 mcg Oral Daily Madelyn Flavors A, MD   0.25 mcg at 08/02/23 0855   Chlorhexidine Gluconate Cloth 2 % PADS 6 each  6 each Topical Q0600 Darnell Level, MD   6 each at 08/03/23 0545   cyanocobalamin (VITAMIN B12) tablet 1,000 mcg  1,000 mcg Oral Daily Zigmund Daniel., MD   1,000 mcg at 08/02/23 4098   Darbepoetin Alfa (ARANESP) injection 60 mcg  60 mcg Subcutaneous Q Thu-1800 Darnell Level, MD   60 mcg at 08/01/23 2305   folic acid (FOLVITE) tablet 1 mg  1 mg Oral Daily Zigmund Daniel., MD   1 mg at 08/02/23 0855   gabapentin (NEURONTIN) capsule 100 mg  100 mg Oral BID Madelyn Flavors A, MD   100 mg at 08/02/23 2057   heparin injection 1,000 Units  1,000 Units Dialysis PRN Darnell Level, MD       heparin injection 5,000 Units  5,000 Units Subcutaneous Q8H Clydie Braun, MD  5,000 Units at 08/03/23 0544   hydrALAZINE (APRESOLINE) injection 10 mg  10 mg Intravenous Q4H PRN Madelyn Flavors A, MD   10 mg at 07/31/23 0940   hydrALAZINE (APRESOLINE) tablet 100 mg  100 mg Oral Q8H Smith, Rondell A, MD   100 mg at 08/03/23 0545   iron sucrose (VENOFER) 200 mg in sodium chloride 0.9 % 100 mL IVPB  200 mg Intravenous Q24H Zigmund Daniel., MD   Stopped at 08/02/23 1103   lidocaine (PF) (XYLOCAINE) 1 % injection 5 mL  5 mL Intradermal PRN Darnell Level, MD       lidocaine-prilocaine (EMLA) cream 1 Application  1 Application Topical PRN Darnell Level, MD       linagliptin (TRADJENTA) tablet 5 mg  5 mg Oral QHS  Smith, Rondell A, MD   5 mg at 08/02/23 2057   loratadine (CLARITIN) tablet 10 mg  10 mg Oral Daily Madelyn Flavors A, MD   10 mg at 08/02/23 0856   magnesium oxide (MAG-OX) tablet 400 mg  400 mg Oral Daily Zigmund Daniel., MD   400 mg at 08/02/23 1848   melatonin tablet 5 mg  5 mg Oral QHS PRN Clydie Braun, MD   5 mg at 08/01/23 2307   naphazoline-pheniramine (NAPHCON-A) 0.025-0.3 % ophthalmic solution 2 drop  2 drop Both Eyes BID Madelyn Flavors A, MD   2 drop at 08/02/23 2100   nitroGLYCERIN (NITROSTAT) SL tablet 0.4 mg  0.4 mg Sublingual Q5 min PRN Achille Rich, PA-C   0.4 mg at 07/30/23 2246   nitroGLYCERIN 50 mg in dextrose 5 % 250 mL (0.2 mg/mL) infusion  0-200 mcg/min Intravenous Titrated Achille Rich, PA-C   Stopped at 07/31/23 1519   oxyCODONE-acetaminophen (PERCOCET/ROXICET) 5-325 MG per tablet 1 tablet  1 tablet Oral Q4H PRN Clydie Braun, MD   1 tablet at 08/03/23 0544   And   oxyCODONE (Oxy IR/ROXICODONE) immediate release tablet 5 mg  5 mg Oral Q4H PRN Madelyn Flavors A, MD   5 mg at 08/02/23 0557   pentafluoroprop-tetrafluoroeth (GEBAUERS) aerosol 1 Application  1 Application Topical PRN Darnell Level, MD       sodium chloride flush (NS) 0.9 % injection 3 mL  3 mL Intravenous Q12H Smith, Rondell A, MD   3 mL at 08/02/23 2100   triamcinolone ointment (KENALOG) 0.1 % 1 Application  1 Application Topical BID PRN Clydie Braun, MD       trimethobenzamide Dorothey Baseman) injection 200 mg  200 mg Intramuscular Q6H PRN Madelyn Flavors A, MD       zolpidem (AMBIEN) tablet 5 mg  5 mg Oral QHS PRN Madelyn Flavors A, MD   5 mg at 08/02/23 2058      Review of Systems: 10 systems reviewed and negative except per interval history/subjective  Physical Exam: Vitals:   08/03/23 0545 08/03/23 0806  BP: (!) 152/80 (!) 142/80  Pulse:  86  Resp:  16  Temp:  98.3 F (36.8 C)  SpO2:  100%   No intake/output data recorded.  Intake/Output Summary (Last 24 hours) at 08/03/2023  0820 Last data filed at 08/03/2023 0531 Gross per 24 hour  Intake 106.53 ml  Output 1750 ml  Net -1643.47 ml   Constitutional: well-appearing, intermittently tearful, no distress ENMT: ears and nose without scars or lesions, MMM CV: normal rate, no edema Respiratory: clear to auscultation, normal work of breathing Gastrointestinal: soft, non-tender, no palpable masses or  hernias Skin: no visible lesions or rashes Psych: alert, judgement/insight appropriate   Test Results I personally reviewed new and old clinical labs and radiology tests Lab Results  Component Value Date   NA 134 (L) 08/03/2023   K 3.7 08/03/2023   CL 101 08/03/2023   CO2 24 08/03/2023   BUN 23 (H) 08/03/2023   CREATININE 4.44 (H) 08/03/2023   GFR 61.90 07/30/2014   CALCIUM 7.8 (L) 08/03/2023   ALBUMIN <1.5 (L) 08/03/2023   PHOS 3.9 08/03/2023    CBC Recent Labs  Lab 07/30/23 2236 08/01/23 0234 08/02/23 0301 08/03/23 0230  WBC 5.7 5.0 6.7 6.4  NEUTROABS 3.6  --  3.8 3.0  HGB 10.9* 8.3* 8.1* 7.9*  HCT 32.7* 24.6* 23.7* 24.4*  MCV 85.4 85.7 84.9 86.5  PLT 241 223 217 192

## 2023-08-03 NOTE — Assessment & Plan Note (Signed)
Dialysis will help out with fluid management.

## 2023-08-03 NOTE — Progress Notes (Signed)
Received patient in bed to unit.  Alert and oriented.  Informed consent signed and in chart.   TX duration:3 hours  Patient tolerated well.  Transported back to the room  Alert, without acute distress.  Hand-off given to patient's nurse.   Access used: R internal jugular HD Cath Access issues: none  Total UF removed: 1L Medication(s) given: Tylenol, oxycodone, Citriol   08/03/23 1134  Vitals  Temp 98 F (36.7 C)  Temp Source Oral  BP (!) 153/81  MAP (mmHg) 102  BP Location Right Arm  BP Method Automatic  Patient Position (if appropriate) Lying  Pulse Rate 88  Pulse Rate Source Monitor  ECG Heart Rate 89  Resp (!) 22  Oxygen Therapy  SpO2 97 %  O2 Device Room Air  During Treatment Monitoring  Duration of HD Treatment -hour(s) 3 hour(s)  HD Safety Checks Performed Yes  Intra-Hemodialysis Comments Tx completed;Tolerated well  Dialysis Fluid Bolus Normal Saline  Bolus Amount (mL) 300 mL  Post Treatment  Dialyzer Clearance Lightly streaked  Liters Processed 54  Fluid Removed (mL) 1000 mL  Tolerated HD Treatment Yes  Hemodialysis Catheter Right Internal jugular Double lumen Permanent (Tunneled)  Placement Date/Time: 08/01/23 1030   Placed prior to admission: No  Serial / Lot #: [id number]  Expiration Date: 12/18/27  Time Out: Correct site;Correct patient;Correct procedure  Maximum sterile barrier precautions: Hand hygiene;Cap;Large sterile she...  Site Condition No complications  Blue Lumen Status Flushed;Heparin locked;Dead end cap in place  Red Lumen Status Flushed;Heparin locked;Dead end cap in place  Purple Lumen Status N/A  Catheter fill solution Heparin 1000 units/ml  Catheter fill volume (Arterial) 1.6 cc  Catheter fill volume (Venous) 1.6  Dressing Type Transparent  Dressing Status Antimicrobial disc in place;Clean, Dry, Intact  Interventions  (deaccessed)  Drainage Description None  Dressing Change Due 08/09/23  Post treatment catheter status Capped and  Clamped     Stacie Glaze LPN [facility]

## 2023-08-03 NOTE — Assessment & Plan Note (Signed)
Patient has a PermCath and completed 3 dialysis sessions here in the hospital.  Will need outpatient dialysis slot.  Patient vomited after dialysis today.

## 2023-08-04 ENCOUNTER — Inpatient Hospital Stay (HOSPITAL_COMMUNITY): Payer: 59

## 2023-08-04 DIAGNOSIS — N186 End stage renal disease: Secondary | ICD-10-CM | POA: Diagnosis not present

## 2023-08-04 LAB — RENAL FUNCTION PANEL
Albumin: <1.5 g/dL — ABNORMAL LOW (ref 3.5–5.0)
Anion gap: 6 (ref 5–15)
BUN: 13 mg/dL (ref 6–20)
CO2: 30 mmol/L (ref 22–32)
Calcium: 7.9 mg/dL — ABNORMAL LOW (ref 8.9–10.3)
Chloride: 96 mmol/L — ABNORMAL LOW (ref 98–111)
Creatinine, Ser: 3.7 mg/dL — ABNORMAL HIGH (ref 0.44–1.00)
GFR, Estimated: 13 mL/min — ABNORMAL LOW (ref >60)
Glucose, Bld: 167 mg/dL — ABNORMAL HIGH (ref 70–99)
Phosphorus: 3.7 mg/dL (ref 2.5–4.6)
Potassium: 3.4 mmol/L — ABNORMAL LOW (ref 3.5–5.1)
Sodium: 132 mmol/L — ABNORMAL LOW (ref 135–145)

## 2023-08-04 LAB — GLUCOSE, CAPILLARY
Glucose-Capillary: 84 mg/dL (ref 70–99)
Glucose-Capillary: 125 mg/dL — ABNORMAL HIGH (ref 70–99)
Glucose-Capillary: 123 mg/dL — ABNORMAL HIGH (ref 70–99)

## 2023-08-04 LAB — TROPONIN I (HIGH SENSITIVITY)
Troponin I (High Sensitivity): 41 ng/L — ABNORMAL HIGH (ref <18)
Troponin I (High Sensitivity): 38 ng/L — ABNORMAL HIGH (ref <18)

## 2023-08-04 LAB — HEMOGLOBIN AND HEMATOCRIT, BLOOD
HCT: 25.8 % — ABNORMAL LOW (ref 36.0–46.0)
Hemoglobin: 8.2 g/dL — ABNORMAL LOW (ref 12.0–15.0)

## 2023-08-04 LAB — HEMOGLOBIN A1C
Hgb A1c MFr Bld: 4.7 % — ABNORMAL LOW (ref 4.8–5.6)
Mean Plasma Glucose: 88.19 mg/dL

## 2023-08-04 MED ORDER — LINAGLIPTIN 5 MG PO TABS
5.0000 mg | ORAL_TABLET | Freq: Every day | ORAL | Status: DC
  Administered 2023-08-04: 5 mg via ORAL
  Filled 2023-08-04: qty 1

## 2023-08-04 NOTE — Progress Notes (Signed)
Patient was seen for anxiety, chest pressure, and SOB.   S: She complains of SOB, central chest pressure, and anxiety that began shortly after change of shift.   O: BP 160/70, HR 90, RR 14, O2 sat 100% on 2 Lpm.  She is anxious but not in respiratory distress. There is no pallor or diaphoresis. Lungs CTAB. Skin warm and dry.   A/P: Anxiety, chest pressure, and SOB.   Plan to check EKG, troponin x2, and CXR. In terms of the prominent anxiety, Remeron has been on hold (presumably d/t prolonged QT) and there may be some withdrawal effect (and possible cocaine withdrawal). She is receiving Ambien and gabapentin right now; will give time for these medications to take effect before considering anxiolytic.

## 2023-08-04 NOTE — Progress Notes (Addendum)
Nephrology Follow-Up Consult note   Assessment/Recommendations: Carol [name] is a/an 62 y.o. female with a past medical history significant for DM2, CHF, COPD, CKD5, admitted for ESRD.      ESRD: progressive CKD V now ESRD. -Status post TDC and first stage BB AVF on 12/12; appreciate help -Continue dialysis on TTS schedule -CLIP initiated; likely TCU -Continue to monitor daily Cr, Dose meds for GFR -Monitor Daily I/Os, Daily weight  -Avoid nephrotoxic medications including NSAIDs -Use synthetic opioids (Fentanyl/Dilaudid) if needed   Anemia of CKD: Hemoglobin low.  Repeat iron stores demonstrate deficiency.  IV iron ordered.  ESA on board  Secondary hyperparathyroidism: PTH 234.  Calcium corrects to normal.  Phosphorus normal  Hypertension: Blood pressure has improved overall improved.  Fluctuating some.  Continue with ultrafiltration on dialysis.  Continue current medications  Cocaine use disorder: Counseled on cessation   Recommendations conveyed to primary service.    Darnell Level [facility] 08/04/2023 9:22 AM  ___________________________________________________________  CC: Chest pain  Interval History/Subjective: Patient tolerated dialysis yesterday with no issues.  Some nausea after dialysis.  Today feels well except has some mild dizziness at times.  Blood pressure not low  Medications:  Current Facility-Administered Medications  Medication Dose Route Frequency Provider Last Rate Last Admin   acetaminophen (TYLENOL) tablet 650 mg  650 mg Oral Q6H PRN Madelyn Flavors A, MD   650 mg at 08/03/23 1610   Or   acetaminophen (TYLENOL) suppository 650 mg  650 mg Rectal Q6H PRN Madelyn Flavors A, MD       albuterol (PROVENTIL) (2.5 MG/3ML) 0.083% nebulizer solution 2.5 mg  2.5 mg Nebulization Q6H PRN Katrinka Blazing, Rondell A, MD       amLODipine (NORVASC) tablet 10 mg  10 mg Oral Daily Katrinka Blazing, Rondell A, MD   10 mg at 08/04/23 0903   arformoterol  (BROVANA) nebulizer solution 15 mcg  15 mcg Nebulization BID Madelyn Flavors A, MD   15 mcg at 08/04/23 0748   aspirin EC tablet 81 mg  81 mg Oral Daily Smith, Rondell A, MD   81 mg at 08/04/23 0904   atorvastatin (LIPITOR) tablet 80 mg  80 mg Oral Daily Smith, Rondell A, MD   80 mg at 08/04/23 0903   budesonide (PULMICORT) nebulizer solution 0.5 mg  0.5 mg Nebulization BID Madelyn Flavors A, MD   0.5 mg at 08/04/23 9604   calcitRIOL (ROCALTROL) capsule 0.25 mcg  0.25 mcg Oral Daily Madelyn Flavors A, MD   0.25 mcg at 08/04/23 5409   Chlorhexidine Gluconate Cloth 2 % PADS 6 each  6 each Topical Q0600 Darnell Level, MD   6 each at 08/04/23 0631   cyanocobalamin (VITAMIN B12) tablet 1,000 mcg  1,000 mcg Oral Daily Zigmund Daniel., MD   1,000 mcg at 08/04/23 8119   Darbepoetin Alfa (ARANESP) injection 60 mcg  60 mcg Subcutaneous Q Thu-1800 Darnell Level, MD   60 mcg at 08/01/23 2305   folic acid (FOLVITE) tablet 1 mg  1 mg Oral Daily Zigmund Daniel., MD   1 mg at 08/04/23 0904   gabapentin (NEURONTIN) capsule 100 mg  100 mg Oral BID Madelyn Flavors A, MD   100 mg at 08/04/23 0904   heparin injection 5,000 Units  5,000 Units Subcutaneous Q8H Madelyn Flavors A, MD   5,000 Units at 08/04/23 0631   hydrALAZINE (APRESOLINE) injection 10 mg  10 mg Intravenous Q4H PRN Clydie Braun, MD  10 mg at 07/31/23 0940   hydrALAZINE (APRESOLINE) tablet 100 mg  100 mg Oral Q8H Smith, Rondell A, MD   100 mg at 08/04/23 0631   iron sucrose (VENOFER) 200 mg in sodium chloride 0.9 % 100 mL IVPB  200 mg Intravenous Q24H Zigmund Daniel., MD   Stopped at 08/03/23 1410   linagliptin (TRADJENTA) tablet 5 mg  5 mg Oral QHS Zigmund Daniel., MD       loratadine (CLARITIN) tablet 10 mg  10 mg Oral Daily Madelyn Flavors A, MD   10 mg at 08/04/23 0102   magnesium oxide (MAG-OX) tablet 400 mg  400 mg Oral Daily Zigmund Daniel., MD   400 mg at 08/04/23 7253   melatonin tablet 5 mg  5 mg Oral QHS  PRN Clydie Braun, MD   5 mg at 08/01/23 2307   naphazoline-pheniramine (NAPHCON-A) 0.025-0.3 % ophthalmic solution 2 drop  2 drop Both Eyes BID Madelyn Flavors A, MD   2 drop at 08/04/23 0909   nitroGLYCERIN (NITROSTAT) SL tablet 0.4 mg  0.4 mg Sublingual Q5 min PRN Achille Rich, PA-C   0.4 mg at 07/30/23 2246   oxyCODONE-acetaminophen (PERCOCET/ROXICET) 5-325 MG per tablet 1 tablet  1 tablet Oral Q4H PRN Clydie Braun, MD   1 tablet at 08/04/23 6644   And   oxyCODONE (Oxy IR/ROXICODONE) immediate release tablet 5 mg  5 mg Oral Q4H PRN Madelyn Flavors A, MD   5 mg at 08/04/23 0904   sodium chloride flush (NS) 0.9 % injection 3 mL  3 mL Intravenous Q12H Smith, Rondell A, MD   3 mL at 08/04/23 0915   triamcinolone ointment (KENALOG) 0.1 % 1 Application  1 Application Topical BID PRN Clydie Braun, MD       trimethobenzamide Dorothey Baseman) injection 200 mg  200 mg Intramuscular Q6H PRN Madelyn Flavors A, MD   200 mg at 08/04/23 0910   zolpidem (AMBIEN) tablet 5 mg  5 mg Oral QHS PRN Clydie Braun, MD   5 mg at 08/03/23 2140      Review of Systems: 10 systems reviewed and negative except per interval history/subjective  Physical Exam: Vitals:   08/04/23 0748 08/04/23 0832  BP:  (!) 172/94  Pulse:  95  Resp:  20  Temp:  97.9 F (36.6 C)  SpO2: 100% 100%   Total I/O In: 120 [P.O.:120] Out: -   Intake/Output Summary (Last 24 hours) at 08/04/2023 0347 Last data filed at 08/04/2023 4259 Gross per 24 hour  Intake 348.8 ml  Output 1202 ml  Net -853.2 ml   Constitutional: well-appearing, no distress ENMT: ears and nose without scars or lesions, MMM CV: normal rate, no edema Respiratory: Bilateral chest rise with no increased work of breathing Gastrointestinal: soft, non-tender, no palpable masses or hernias Skin: no visible lesions or rashes Psych: alert, judgement/insight appropriate   Test Results I personally reviewed new and old clinical labs and radiology tests Lab  Results  Component Value Date   NA 132 (L) 08/04/2023   K 3.4 (L) 08/04/2023   CL 96 (L) 08/04/2023   CO2 30 08/04/2023   BUN 13 08/04/2023   CREATININE 3.70 (H) 08/04/2023   GFR 61.90 07/30/2014   CALCIUM 7.9 (L) 08/04/2023   ALBUMIN <1.5 (L) 08/04/2023   PHOS 3.7 08/04/2023    CBC Recent Labs  Lab 07/30/23 2236 08/01/23 0234 08/02/23 0301 08/03/23 0230 08/04/23 0251  WBC 5.7 5.0 6.7  6.4  --   NEUTROABS 3.6  --  3.8 3.0  --   HGB 10.9* 8.3* 8.1* 7.9* 8.2*  HCT 32.7* 24.6* 23.7* 24.4* 25.8*  MCV 85.4 85.7 84.9 86.5  --   PLT 241 223 217 192  --

## 2023-08-04 NOTE — Evaluation (Signed)
Physical Therapy Evaluation Patient Details Name: Carol [name] MRN: [id number] DOB: Jun 29, 1961 Today's Date: 08/04/2023  History of Present Illness  Pt is a 62 y.o. F who presents 07/30/2023 with shortness of breath and chest pain. Found to have AKI superimposed on chronic kidney disease stage V, now EDRS s/p tunneled dialysis catheter and left arm first stage brachiobasilic fistula creation, initiating HD 12/12. Significant PMH: HTN, diastolic congestive heart failure, COPD, DM2, sarcoidosis of the liver, CKD stage V, chronic back pain, polysubstance abuse.  Clinical Impression  Pt admitted with above. PTA, pt lives alone and is independent with mobility using a RW. Pt reports "sleepiness," from medication. Pt demonstrates ability to transfer to standing and walk a short room distance with a RW without physical assist. Stood at sink to complete ADL task of brushing teeth. Pt declining further ambulation at this time and requesting to return to bed. Will continue to follow acutely, but don't anticipate need for PT follow up.        If plan is discharge home, recommend the following: Assistance with cooking/housework;Help with stairs or ramp for entrance;Assist for transportation   Can travel by private vehicle        Equipment Recommendations None recommended by PT  Recommendations for Other Services       Functional Status Assessment Patient has had a recent decline in their functional status and demonstrates the ability to make significant improvements in function in a reasonable and predictable amount of time.     Precautions / Restrictions Precautions Precautions: None Restrictions Weight Bearing Restrictions Per Provider Order: No      Mobility  Bed Mobility Overal bed mobility: Modified Independent                  Transfers Overall transfer level: Modified independent Equipment used: Rolling walker (2 wheels)                     Ambulation/Gait Ambulation/Gait assistance: Modified independent (Device/Increase time) Gait Distance (Feet): 5 Feet Assistive device: Rolling walker (2 wheels) Gait Pattern/deviations: Step-through pattern, Decreased stride length          Stairs            Wheelchair Mobility     Tilt Bed    Modified Rankin (Stroke Patients Only)       Balance Overall balance assessment: Mild deficits observed, not formally tested                                           Pertinent Vitals/Pain Pain Assessment Pain Assessment: No/denies pain    Home Living Family/patient expects to be discharged to:: Private residence Living Arrangements: Alone Available Help at Discharge: Family;Available PRN/intermittently Type of Home: House Home Access: Level entry       Home Layout: One level Home Equipment: Cane - single Librarian, academic (2 wheels);Shower seat;Grab bars - tub/shower      Prior Function Prior Level of Function : Independent/Modified Independent;Driving             Mobility Comments: using Rw       Extremity/Trunk Assessment   Upper Extremity Assessment Upper Extremity Assessment: Overall WFL for tasks assessed    Lower Extremity Assessment Lower Extremity Assessment: RLE deficits/detail;LLE deficits/detail RLE Deficits / Details: Able to perform limited SLR LLE Deficits / Details: Able to perform limited SLR. Increased edema  distally    Cervical / Trunk Assessment Cervical / Trunk Assessment: Normal  Communication   Communication Communication: No apparent difficulties  Cognition Arousal: Alert Behavior During Therapy: WFL for tasks assessed/performed Overall Cognitive Status: Within Functional Limits for tasks assessed                                          General Comments      Exercises     Assessment/Plan    PT Assessment Patient needs continued PT services  PT Problem List Decreased  activity tolerance;Decreased strength;Decreased balance;Decreased mobility       PT Treatment Interventions DME instruction;Gait training;Functional mobility training;Therapeutic activities;Therapeutic exercise;Balance training;Patient/family education    PT Goals (Current goals can be found in the Care Plan section)  Acute Rehab PT Goals Patient Stated Goal: less nausea PT Goal Formulation: With patient Time For Goal Achievement: 08/18/23 Potential to Achieve Goals: Good    Frequency Min 1X/week     Co-evaluation               AM-PAC PT "6 Clicks" Mobility  Outcome Measure Help needed turning from your back to your side while in a flat bed without using bedrails?: None Help needed moving from lying on your back to sitting on the side of a flat bed without using bedrails?: None Help needed moving to and from a bed to a chair (including a wheelchair)?: None Help needed standing up from a chair using your arms (e.g., wheelchair or bedside chair)?: None Help needed to walk in hospital room?: None Help needed climbing 3-5 steps with a railing? : A Little 6 Click Score: 23    End of Session   Activity Tolerance: Patient tolerated treatment well Patient left: in bed;with call bell/phone within reach Nurse Communication: Mobility status PT Visit Diagnosis: Difficulty in walking, not elsewhere classified (R26.2)    Time: 1610-9604 PT Time Calculation (min) (ACUTE ONLY): 24 min   Charges:   PT Evaluation $PT Eval Low Complexity: 1 Low PT Treatments $Therapeutic Activity: 8-22 mins PT General Charges $$ ACUTE PT VISIT: 1 Visit         Lillia Pauls, PT, DPT Acute Rehabilitation Services Office [phone]   Norval Morton 08/04/2023, 12:31 PM

## 2023-08-04 NOTE — Progress Notes (Addendum)
PROGRESS NOTE    Carol [name]  EXB:[id number] DOB: Jan 27, 1961 DOA: 07/30/2023 PCP: [facility]  Chief Complaint  Patient presents with   Shortness of Breath   Chest Pain    Brief Narrative:   Carol [name] is Carol [name] 62 y.o. female with medical history significant of hypertension, diastolic congestive heart failure, COPD, diabetes mellitus type 2, sarcoidosis of the liver, chronic kidney disease stage V, chronic back pain, polysubstance abuse (including cocaine, alcohol, tobacco), and GERD who presents with complaints of shortness of breath and chest pain.  Symptoms started yesterday evening after she has mild cocaine prior.  She reported having pain across her chest with shortness of breath and intermittently productive cough.  Denies having any recent fevers, nausea, vomiting, or diarrhea.   Records note she had been hospitalized back in August 2024 for CHF and at that time her creatinine was noted to be 4.87 and had been sent home on torosemide.  She reports that she was seen by nephrology once in the outpatient setting after that hospitalization, but had not been back to them thereafter.  She reports being on chronic pain medicine due to history of injury to her back several years ago.  Patient has continued to be able to make urine.   In the emergency department patient was noted to be afebrile with respirations elevated to 32, blood pressures elevated up to 252/107, and all other vital signs relatively maintained.  Labs from 12/10 significant for hemoglobin 10.9, CO2 18, BUN 46, creatinine 7.14, anion gap 18, BUN 2.3, lactic acid 0.8, BNP 623.8, high-sensitivity troponins 39->45-> 49.  Chest x-ray noted heart at the upper limit of normal, but was otherwise did not note any acute abnormality.  Urinalysis did not note significant concern for infection.  UDS was positive for cocaine.  Nephrology have been formally consulted.  Patient having given Tylenol 650 mg p.o.,  nitroglycerin 0.4 mg sublingual, and noted on Herman Fiero nitroglycerin drip.  Nephrology have been consulted due to patient's worsening kidney function.  Assessment & Plan:   Principal Problem:   ESRD (end stage renal disease) (HCC) Active Problems:   Hypertensive emergency   Type 2 diabetes mellitus with hyperlipidemia (HCC)   Chronic diastolic CHF (congestive heart failure) (HCC)   borderline Prolonged QT interval   Elevated troponin   Chest pain   Metabolic acidosis, increased anion gap   Anemia of chronic disease   Chronic pain   Polysubstance abuse (HCC)   Overweight (BMI 25.0-29.9)  Acute kidney injury superimposed on chronic kidney disease stage V  Now ESRD On admission creatinine noted to be 7.14 with BUN 46.  Baseline creatinine previously had been around 4 when last checked in August of this year.  Nephrology have been formally consulted due to worsening kidney function who had also consulted vascular surgery for access. -s/p tunneled dialysis catheter and left arm first stage brachiobasilic fistula creation  - ready for discharge from vascular standpoint -dialysis 12/12, 12/13, and 12/14.  Now T/Th/S schedule.  Clip.  Discharge pending outpatient dialysis.    Chest pain Elevated troponin Acute.  Patient present with complaints of chest pain after using cocaine.  Chest x-ray noted heart size at the upper limit of normal, but did not note any acute abnormality.  High-sensitivity troponin 39->45-> 49-> 48.  EKG without significant ischemic changes. Patient with prior cardiac MRI which noted study was more consistent with Shelbee Apgar small prior RCA territory infarct and cardiac sarcoidosis in 06/2022. Suspect secondary to demand  secondary to cocaine use. -Admit to progressive bed -Follow-up echocardiogram -> notable for LVOT obstruction with flow acceleration -> cards recommending considering cardiac MRI to evaluate etiology of LV wall thickening (discussed with cardiology recommended discussing  with cards in outpatient follow up -> of note, she had cardiac MRI 06/2022)    Hypertensive emergency -Continue amlodipine, hydralazine -Hydralazine IV prn -follow with dialysis    Diastolic congestive heart failure Chronic.  On physical exam patient appears relatively euvolemic besides some mild lower extremity edema.  Chest x-ray did not note any acute abnormality.  BNP 623.8 which appears lower than prior admission in August of this year.  Last EF noted to be 60 to 65% with grade 2 diastolic dysfunction and moderately dilated left atrium when checked on 03/08/2023. -repeat echo as above  -volume per renal with dialysis -Renal/carb modified diet with fluid restriction   Prolonged QT interval Acute on chronic.  -Caution with qt prolonging meds -Correct electrolyte abnormalities -trend EKG (qtc 505 12/13)   Controlled diabetes mellitus type 2, without long-term use of insulin On admission glucose 121.  Last hemoglobin A1c 5.7 on 8/24.  Home medication regimen includes Tradjenta. -Repeat A1c pending -Continue Tradjenta -no need for BG monitoring at this time   Hyperlipidemia -Continue atorvastatin   Metabolic acidosis with increased anion gap resolved   Anemia of chronic disease Folate Deficiency  Iron Deficiency Low Normal B12 Will trend Replace B12/folate.  Iron per renal.   Chronic pain Patient is on chronic opioids due to history of back pain related to injury several years ago -Continue oxycodone as needed caution with renal disease   Polysubstance abuse Patient admitted to using cocaine prior to arrival.  UDS positive for cocaine.  Patient does not drink alcohol on Osmel Dykstra regular basis and only intermittently smokes cigarettes. -Transitions of care consulted for substance abuse    DVT prophylaxis: heparin Code Status: full Family Communication: none Disposition:   Status is: Inpatient Remains inpatient appropriate because: dispo per renal   Consultants:   Nephrology Vascular   Procedures:  12/12 PROCEDURE:   Ultrasound and fluoroscopic guided tunneled dialysis catheter placement Left arm first stage brachiobasilic fistula creation  Antimicrobials:  Anti-infectives (From admission, onward)    None       Subjective: No complaints, eating breakfast, lying in bed  Objective: Vitals:   08/03/23 1957 08/03/23 2350 08/04/23 0411 08/04/23 0748  BP: (!) 176/84 (!) 149/68 (!) 156/84   Pulse: 86 93 87   Resp: 20 17 16    Temp: 98 F (36.7 C) 98.4 F (36.9 C) 98.1 F (36.7 C)   TempSrc: Oral Oral Oral   SpO2: 95% 93% 95% 100%  Weight:   78.9 kg   Height:        Intake/Output Summary (Last 24 hours) at 08/04/2023 0820 Last data filed at 08/03/2023 2250 Gross per 24 hour  Intake 228.8 ml  Output 1202 ml  Net -973.2 ml   Filed Weights   08/03/23 0807 08/03/23 1145 08/04/23 0411  Weight: 79.3 kg 78.3 kg 78.9 kg    Examination:  General: No acute distress. Eating breakfast. Lungs: unlabored Neurological: Alert and oriented 3. Moves all extremities 4 . Cranial nerves II through XII grossly intact.   Data Reviewed: I have personally reviewed following labs and imaging studies  CBC: Recent Labs  Lab 07/30/23 2236 08/01/23 0234 08/02/23 0301 08/03/23 0230 08/04/23 0251  WBC 5.7 5.0 6.7 6.4  --   NEUTROABS 3.6  --  3.8 3.0  --  HGB 10.9* 8.3* 8.1* 7.9* 8.2*  HCT 32.7* 24.6* 23.7* 24.4* 25.8*  MCV 85.4 85.7 84.9 86.5  --   PLT 241 223 217 192  --     Basic Metabolic Panel: Recent Labs  Lab 07/31/23 1136 08/01/23 0234 08/02/23 0301 08/03/23 0230 08/04/23 0251  NA 135 135 132* 134* 132*  K 3.4* 3.8 3.5 3.7 3.4*  CL 104 106 102 101 96*  CO2 17* 19* 22 24 30   GLUCOSE 147* 77 122* 94 167*  BUN 41* 45* 33* 23* 13  CREATININE 7.10* 7.49* 5.57* 4.44* 3.70*  CALCIUM 7.7* 7.5* 7.3* 7.8* 7.9*  MG 1.4*  --  1.5* 1.6*  --   PHOS 5.2* 5.8* 3.9 3.9 3.7    GFR: Estimated Creatinine Clearance: 16.4 mL/min (Harding Thomure)  (by C-G formula based on SCr of 3.7 mg/dL (H)).  Liver Function Tests: Recent Labs  Lab 07/30/23 2236 07/31/23 1136 08/01/23 0234 08/02/23 0301 08/03/23 0230 08/04/23 0251  AST 33  --   --  13* 13*  --   ALT 12  --   --  7 6  --   ALKPHOS 119  --   --  95 96  --   BILITOT 1.0  --   --  0.5 0.5  --   PROT 7.0  --   --  4.8*  --   --   ALBUMIN 2.3* 1.7* 1.5* <1.5* <1.5* <1.5*    CBG: Recent Labs  Lab 08/01/23 0824 08/01/23 1152 08/03/23 1532 08/03/23 2023 08/04/23 0554  GLUCAP 80 88 165* 142* 84     No results found for this or any previous visit (from the past 240 hours).       Radiology Studies: VAS [place] LOWER EXTREMITY VENOUS (DVT) Result Date: 08/03/2023  Lower Venous DVT Study Patient Name:  Carol [name]  Date of Exam:   08/03/2023 Medical Rec #: [id number]                Accession #:    [id number] Date of Birth: [birth year]                Patient Gender: F Patient Age:   43 years Exam Location:  [facility] Procedure:      VAS [place] LOWER EXTREMITY VENOUS (DVT) Referring Phys: Carlon Chaloux POWELL JR --------------------------------------------------------------------------------  Indications: Edema.  Risk Factors: CKD V, CHF, cocaine abuse. Comparison Study: Prior negative right LEV done 04/26/18 Performing Technologist: Sherren Kerns RVS  Examination Guidelines: Sire Poet complete evaluation includes B-mode imaging, spectral Doppler, color Doppler, and power Doppler as needed of all accessible portions of each vessel. Bilateral testing is considered an integral part of Calani Gick complete examination. Limited examinations for reoccurring indications may be performed as noted. The reflux portion of the exam is performed with the patient in reverse Trendelenburg.  +---------+---------------+---------+-----------+----------+--------------+ RIGHT    CompressibilityPhasicitySpontaneityPropertiesThrombus Aging +---------+---------------+---------+-----------+----------+--------------+  CFV      Full           Yes      Yes                                 +---------+---------------+---------+-----------+----------+--------------+ SFJ      Full                                                        +---------+---------------+---------+-----------+----------+--------------+  FV Prox  Full                                                        +---------+---------------+---------+-----------+----------+--------------+ FV Mid   Full                                                        +---------+---------------+---------+-----------+----------+--------------+ FV DistalFull                                                        +---------+---------------+---------+-----------+----------+--------------+ PFV      Full                                                        +---------+---------------+---------+-----------+----------+--------------+ POP      Full           Yes      Yes                                 +---------+---------------+---------+-----------+----------+--------------+ PTV      Full                                                        +---------+---------------+---------+-----------+----------+--------------+ PERO     Full                                                        +---------+---------------+---------+-----------+----------+--------------+   +---------+---------------+---------+-----------+----------+--------------+ LEFT     CompressibilityPhasicitySpontaneityPropertiesThrombus Aging +---------+---------------+---------+-----------+----------+--------------+ CFV      Full           Yes      Yes                                 +---------+---------------+---------+-----------+----------+--------------+ SFJ      Full                                                        +---------+---------------+---------+-----------+----------+--------------+ FV Prox  Full                                                         +---------+---------------+---------+-----------+----------+--------------+  FV Mid   Full                                                        +---------+---------------+---------+-----------+----------+--------------+ FV DistalFull           Yes      Yes                                 +---------+---------------+---------+-----------+----------+--------------+ PFV      Full                                                        +---------+---------------+---------+-----------+----------+--------------+ POP      Full           Yes      Yes                                 +---------+---------------+---------+-----------+----------+--------------+ PTV      Full                                                        +---------+---------------+---------+-----------+----------+--------------+ PERO     Full                                                        +---------+---------------+---------+-----------+----------+--------------+    Summary: RIGHT: - No evidence of deep vein thrombosis in the lower extremity. No indirect evidence of obstruction proximal to the inguinal ligament.  - No cystic structure found in the popliteal fossa. interstitial edema noted throughout  LEFT: - No evidence of deep vein thrombosis in the lower extremity. No indirect evidence of obstruction proximal to the inguinal ligament.  - No cystic structure found in the popliteal fossa. Interstitial edema noted throughout.  *See table(s) above for measurements and observations.    Preliminary    ECHOCARDIOGRAM COMPLETE Result Date: 08/02/2023    ECHOCARDIOGRAM REPORT   Patient Name:   Carol [name] Date of Exam: 08/02/2023 Medical Rec #:  [id number]               Height:       64.0 in Accession #:    [id number]              Weight:       180.6 lb Date of Birth:  1961-06-30               BSA:          1.873 m Patient Age:    62 years                BP:            162/76 mmHg Patient Gender: F  HR:           98 bpm. Exam Location:  Inpatient Procedure: 2D Echo, Cardiac Doppler and Color Doppler Indications:    CHF  History:        Patient has prior history of Echocardiogram examinations, most                 recent 03/08/2023. CHF, COPD; Risk Factors:Hypertension and                 Diabetes.  Sonographer:    Darlys Gales Referring Phys: [phone] Dalaney Needle CALDWELL POWELL JR IMPRESSIONS  1. LVOT obstruction with flow acceleration, no definite SAM or SAM related MR. Max instantaneous gradient 21 mmHg, velocity 2.3 m/s. Consider cardiac MRI to further evaluate etiology of left ventricular wall thickening.  2. Left ventricular ejection fraction, by estimation, is 65 to 70%. The left ventricle has normal function. The left ventricle has no regional wall motion abnormalities. There is severe left ventricular hypertrophy. Indeterminate diastolic filling due to E-Emmy Keng fusion.  3. Right ventricular systolic function is normal. The right ventricular size is normal. Tricuspid regurgitation signal is inadequate for assessing PA pressure.  4. The mitral valve is grossly normal. Trivial mitral valve regurgitation. No evidence of mitral stenosis.  5. The aortic valve is tricuspid. Aortic valve regurgitation is not visualized. No aortic stenosis is present.  6. The inferior vena cava is normal in size with greater than 50% respiratory variability, suggesting right atrial pressure of 3 mmHg. FINDINGS  Left Ventricle: Left ventricular ejection fraction, by estimation, is 65 to 70%. The left ventricle has normal function. The left ventricle has no regional wall motion abnormalities. The left ventricular internal cavity size was normal in size. There is  severe left ventricular hypertrophy. Indeterminate diastolic filling due to E-Aleera Gilcrease fusion. Right Ventricle: The right ventricular size is normal. Right vetricular wall thickness was not well visualized. Right ventricular systolic function  is normal. Tricuspid regurgitation signal is inadequate for assessing PA pressure. Left Atrium: Left atrial size was normal in size. Right Atrium: Right atrial size was normal in size. Pericardium: There is no evidence of pericardial effusion. Mitral Valve: The mitral valve is grossly normal. Trivial mitral valve regurgitation. No evidence of mitral valve stenosis. Tricuspid Valve: The tricuspid valve is normal in structure. Tricuspid valve regurgitation is trivial. No evidence of tricuspid stenosis. Aortic Valve: The aortic valve is tricuspid. Aortic valve regurgitation is not visualized. No aortic stenosis is present. Aortic valve mean gradient measures 10.0 mmHg. Aortic valve peak gradient measures 14.7 mmHg. Aortic valve area, by VTI measures 2.58 cm. Pulmonic Valve: The pulmonic valve was not well visualized. Pulmonic valve regurgitation is not visualized. No evidence of pulmonic stenosis. Aorta: The aortic root is normal in size and structure. Venous: The inferior vena cava is normal in size with greater than 50% respiratory variability, suggesting right atrial pressure of 3 mmHg. IAS/Shunts: No atrial level shunt detected by color flow Doppler.  LEFT VENTRICLE PLAX 2D LVIDd:         3.00 cm   Diastology LV PW:         1.77 cm   LV e' medial:    4.13 cm/s LV IVS:        1.60 cm   LV E/e' medial:  17.9 LVOT diam:     1.80 cm   LV e' lateral:   4.46 cm/s LV SV:         92  LV E/e' lateral: 16.6 LV SV Index:   49 LVOT Area:     2.54 cm  RIGHT VENTRICLE RV S prime:     16.40 cm/s TAPSE (M-mode): 4.3 cm LEFT ATRIUM             Index        RIGHT ATRIUM          Index LA Vol (A2C):   30.3 ml 16.18 ml/m  RA Area:     9.09 cm LA Vol (A4C):   64.5 ml 34.43 ml/m  RA Volume:   16.70 ml 8.92 ml/m LA Biplane Vol: 46.0 ml 24.56 ml/m  AORTIC VALVE AV Area (Vmax):    2.33 cm AV Area (Vmean):   2.27 cm AV Area (VTI):     2.58 cm AV Vmax:           192.00 cm/s AV Vmean:          156.000 cm/s AV VTI:             0.355 m AV Peak Grad:      14.7 mmHg AV Mean Grad:      10.0 mmHg LVOT Vmax:         176.00 cm/s LVOT Vmean:        139.000 cm/s LVOT VTI:          0.360 m LVOT/AV VTI ratio: 1.01 MITRAL VALVE MV Area (PHT): 3.07 cm     SHUNTS MV Decel Time: 247 msec     Systemic VTI:  0.36 m MV E velocity: 73.90 cm/s   Systemic Diam: 1.80 cm MV Rosselyn Martha velocity: 121.00 cm/s MV E/Mycal Conde ratio:  0.61 Weston Brass MD Electronically signed by Weston Brass MD Signature Date/Time: 08/02/2023/3:14:51 PM    Final         Scheduled Meds:  amLODipine  10 mg Oral Daily   arformoterol  15 mcg Nebulization BID   aspirin EC  81 mg Oral Daily   atorvastatin  80 mg Oral Daily   budesonide (PULMICORT) nebulizer solution  0.5 mg Nebulization BID   calcitRIOL  0.25 mcg Oral Daily   Chlorhexidine Gluconate Cloth  6 each Topical Q0600   vitamin B-12  1,000 mcg Oral Daily   darbepoetin (ARANESP) injection - DIALYSIS  60 mcg Subcutaneous Q Thu-1800   folic acid  1 mg Oral Daily   gabapentin  100 mg Oral BID   heparin  5,000 Units Subcutaneous Q8H   hydrALAZINE  100 mg Oral Q8H   loratadine  10 mg Oral Daily   magnesium oxide  400 mg Oral Daily   naphazoline-pheniramine  2 drop Both Eyes BID   sodium chloride flush  3 mL Intravenous Q12H   Continuous Infusions:  iron sucrose Stopped (08/03/23 1410)     LOS: 4 days    Time spent: over 30 min     Lacretia Nicks, MD [facility]   To contact the attending provider between 7A-7P or the covering provider during after hours 7P-7A, please log into the web site [email or web address] and access using universal [place] password for that web site. If you do not have the password, please call the hospital operator.  08/04/2023, 8:20 AM

## 2023-08-05 DIAGNOSIS — N186 End stage renal disease: Secondary | ICD-10-CM | POA: Diagnosis not present

## 2023-08-05 LAB — CBC WITH DIFFERENTIAL/PLATELET
Abs Immature Granulocytes: 0.04 10*3/uL (ref 0.00–0.07)
Basophils Absolute: 0 10*3/uL (ref 0.0–0.1)
Basophils Relative: 1 %
Eosinophils Absolute: 1.1 10*3/uL — ABNORMAL HIGH (ref 0.0–0.5)
Eosinophils Relative: 17 %
HCT: 25.5 % — ABNORMAL LOW (ref 36.0–46.0)
Hemoglobin: 8.2 g/dL — ABNORMAL LOW (ref 12.0–15.0)
Immature Granulocytes: 1 %
Lymphocytes Relative: 22 %
Lymphs Abs: 1.4 10*3/uL (ref 0.7–4.0)
MCH: 28.5 pg (ref 26.0–34.0)
MCHC: 32.2 g/dL (ref 30.0–36.0)
MCV: 88.5 fL (ref 80.0–100.0)
Monocytes Absolute: 1.3 10*3/uL — ABNORMAL HIGH (ref 0.1–1.0)
Monocytes Relative: 20 %
Neutro Abs: 2.6 10*3/uL (ref 1.7–7.7)
Neutrophils Relative %: 39 %
Platelets: 220 10*3/uL (ref 150–400)
RBC: 2.88 MIL/uL — ABNORMAL LOW (ref 3.87–5.11)
RDW: 14.6 % (ref 11.5–15.5)
WBC: 6.4 10*3/uL (ref 4.0–10.5)
nRBC: 0 % (ref 0.0–0.2)

## 2023-08-05 LAB — COMPREHENSIVE METABOLIC PANEL
ALT: 5 U/L (ref 0–44)
AST: 18 U/L (ref 15–41)
Albumin: <1.5 g/dL — ABNORMAL LOW (ref 3.5–5.0)
Alkaline Phosphatase: 85 U/L (ref 38–126)
Anion gap: 8 (ref 5–15)
BUN: 18 mg/dL (ref 6–20)
CO2: 27 mmol/L (ref 22–32)
Calcium: 7.8 mg/dL — ABNORMAL LOW (ref 8.9–10.3)
Chloride: 96 mmol/L — ABNORMAL LOW (ref 98–111)
Creatinine, Ser: 4.64 mg/dL — ABNORMAL HIGH (ref 0.44–1.00)
GFR, Estimated: 10 mL/min — ABNORMAL LOW (ref >60)
Glucose, Bld: 111 mg/dL — ABNORMAL HIGH (ref 70–99)
Potassium: 3.7 mmol/L (ref 3.5–5.1)
Sodium: 131 mmol/L — ABNORMAL LOW (ref 135–145)
Total Bilirubin: 0.2 mg/dL (ref <1.2)
Total Protein: 4.8 g/dL — ABNORMAL LOW (ref 6.5–8.1)

## 2023-08-05 LAB — PHOSPHORUS: Phosphorus: 4.1 mg/dL (ref 2.5–4.6)

## 2023-08-05 LAB — GLUCOSE, CAPILLARY
Glucose-Capillary: 122 mg/dL — ABNORMAL HIGH (ref 70–99)
Glucose-Capillary: 110 mg/dL — ABNORMAL HIGH (ref 70–99)

## 2023-08-05 LAB — MAGNESIUM: Magnesium: 1.9 mg/dL (ref 1.7–2.4)

## 2023-08-05 MED ORDER — DARBEPOETIN ALFA 60 MCG/0.3ML IJ SOSY: 60.0000 ug | PREFILLED_SYRINGE | INTRAMUSCULAR | Status: AC

## 2023-08-05 NOTE — Care Management Important Message (Signed)
Important Message  Patient Details  Name: Carol [name] MRN: [id number] Date of Birth: February 13, 1961   Important Message Given:  Yes - Medicare IM     Renie Ora 08/05/2023, 9:14 AM

## 2023-08-05 NOTE — Discharge Summary (Signed)
Physician Discharge Summary   Patient: Carol [name] MRN: [id number] DOB: Dec 12, 1960  Admit date:     07/30/2023  Discharge date: 08/05/23  Discharge Physician: Debarah Crape   PCP: [facility]   Recommendations at discharge:   Follow-up with your scheduled hemodialysis time Follow-up with nephrology for kidney function monitoring Follow-up with cardiovascular surgery to complete AV fistula formation Follow-up with primary care physician for chronic medication management  Discharge Diagnoses: Principal Problem:   ESRD (end stage renal disease) (HCC) Active Problems:   Hypertensive emergency   Type 2 diabetes mellitus with hyperlipidemia (HCC)   Chronic diastolic CHF (congestive heart failure) (HCC)   borderline Prolonged QT interval   Elevated troponin   Chest pain   Metabolic acidosis, increased anion gap   Anemia of chronic disease   Chronic pain   Polysubstance abuse (HCC)   Overweight (BMI 25.0-29.9)  Resolved Problems:   * No resolved hospital problems. [facility] Course: Patient is a 62 year old female with hypertension, CHF, COPD, type 2 diabetes, sarcoidosis of the liver, CKD stage V, chronic back pain, polysubstance abuse including cocaine, alcohol, tobacco, and GERD.  She presents on this admission with shortness of breath and chest pain.  Patient did cocaine just prior to arrival.  On arrival patient was tachypneic with blood pressure 252/107.  Labs revealed significant worsening in her renal function and mildly elevated high-sensitivity troponins.  UDS was positive for cocaine.  Nephrology was consulted. Patient received a tunneled dialysis catheter on left arm for stage 1 brachiobasilic fistula creation via vascular surgery.  She was initiated on dialysis which she completed successfully on 12/12, 12/13, 12/14.  TOC was consulted to help with outpatient hemodialysis arrangements. Regarding patient's chest pain, EKG without any ischemic changes.   Patient had prior cardiac MRI 1 year ago.  Echocardiogram this admission was notable for LVOT obstruction with flow acceleration.  Cardiology recommended outpatient follow-up and will consider additional cardiac MRI at that time. Patient's hypertensive emergency responded to blood pressure medications appropriately.  It was certainly caused by cocaine use.  Patient was counseled extensively on the dangers of cocaine.  She endorses understanding. Patient did have intermittent hypoxia during this admission.  She completed a 6-minute walk test on 12/16 which revealed that she did not require long-term oxygen, she maintained sats at 97% on room air.  Patient was cleared to discharge from her specialist team.  She was given her dialysis arrangements outpatient and instructed on the importance of following up.      Consultants: Nephrology, CVS, Cardiology Procedures performed: [facility] placement, stage 1 brachiobasilic fistula creation Disposition: Home Diet recommendation:  Discharge Diet Orders (From admission, onward)     Start     Ordered   08/05/23 0000  Diet - low sodium heart healthy        08/05/23 1441           Renal diet DISCHARGE MEDICATION: Allergies as of 08/05/2023       Reactions   Penicillins Shortness Of Breath, Other (See Comments)   Caused yeast infection Has patient had a PCN reaction causing immediate rash, facial/tongue/throat swelling, SOB or lightheadedness with hypotension: Yes Has patient had a PCN reaction causing severe rash involving mucus membranes or skin necrosis: No Has patient had a PCN reaction that required hospitalization pt was in the hospital at the time of the reaction Has patient had a PCN reaction occurring within the last 10 years: No If all of the above answers are "  NO", then may proceed with Cephalosp   Ultram [tramadol] Other (See Comments)   Made her tongue raw        Medication List     STOP taking these medications    ergocalciferol  1.25 MG (50000 UT) capsule Commonly known as: VITAMIN D2   furosemide 40 MG tablet Commonly known as: LASIX   isosorbide mononitrate 30 MG 24 hr tablet Commonly known as: IMDUR   potassium chloride 10 MEQ tablet Commonly known as: KLOR-CON M   torsemide 100 MG tablet Commonly known as: DEMADEX       TAKE these medications    albuterol 108 (90 Base) MCG/ACT inhaler Commonly known as: VENTOLIN HFA Inhale 2 puffs into the lungs every 4 (four) hours as needed for wheezing or shortness of breath.   amLODipine 10 MG tablet Commonly known as: NORVASC Take 1 tablet (10 mg total) by mouth daily.   Aspirin Low Dose 81 MG tablet Generic drug: aspirin EC Take 1 tablet (81 mg total) by mouth daily. Swallow whole.   atorvastatin 80 MG tablet Commonly known as: LIPITOR Take 1 tablet (80 mg total) by mouth daily.   budesonide-formoterol 160-4.5 MCG/ACT inhaler Commonly known as: SYMBICORT Inhale 2 puffs into the lungs in the morning and at bedtime.   calcitRIOL 0.25 MCG capsule Commonly known as: ROCALTROL Take 0.25 mcg by mouth daily.   cetirizine 10 MG tablet Commonly known as: ZYRTEC Take 10 mg by mouth daily as needed for allergies.   Darbepoetin Alfa 60 MCG/0.3ML Sosy injection Commonly known as: ARANESP Inject 0.3 mLs (60 mcg total) into the skin every Thursday at 6pm. Start taking on: August 08, 2023   Eszopiclone 3 MG Tabs Take 3 mg by mouth at bedtime. Take immediately before bedtime   gabapentin 100 MG capsule Commonly known as: NEURONTIN Take 1 capsule (100 mg total) by mouth 3 (three) times daily. What changed: when to take this   hydrALAZINE 100 MG tablet Commonly known as: APRESOLINE 100 mg 3 (three) times daily.   melatonin 5 MG Tabs Take 1 tablet (5 mg total) by mouth at bedtime as needed (sleep).   mirtazapine 7.5 MG tablet Commonly known as: REMERON Take 1 tablet (7.5 mg total) by mouth at bedtime.   naphazoline-pheniramine 0.025-0.3 %  ophthalmic solution Commonly known as: NAPHCON-A Place 2 drops into both eyes 2 (two) times daily.   nitroGLYCERIN 0.4 MG SL tablet Commonly known as: NITROSTAT Place 1 tablet (0.4 mg total) under the tongue every 5 (five) minutes as needed for chest pain.   oxyCODONE-acetaminophen 10-325 MG tablet Commonly known as: PERCOCET Take 1 tablet by mouth every 8 (eight) hours as needed for pain.   pantoprazole 40 MG tablet Commonly known as: PROTONIX Take 1 tablet (40 mg total) by mouth daily at 12 noon. What changed: when to take this   Tradjenta 5 MG Tabs tablet Generic drug: linagliptin Take 1 tablet (5 mg total) by mouth daily. What changed: when to take this   triamcinolone ointment 0.1 % Commonly known as: KENALOG 1 Application 2 (two) times daily as needed.        Follow-up Information     [facility] Follow up in 5 week(s).   Specialty: Vascular Surgery Contact information: [address] [place] [address] [phone]        [facility] Follow up.   Specialty: Internal Medicine Contact information: [address] [place] [address] [phone]  Center, [facility]. Go on 08/06/2023.   Why: Transitional Care Unit- Monday, Tuesday, Thursday, Friday with 11:45 am chair time.  On Tuesday (12/17), please arrive at 11:00 am to complete paperwork prior to treatment. Contact information: [address] [place] [address] [phone]                Discharge Exam: Filed Weights   08/03/23 1145 08/04/23 0411 08/05/23 0516  Weight: 78.3 kg 78.9 kg 80 kg   Constitutional:  Normal appearance. Non toxic-appearing.  HENT: Head Normocephalic and atraumatic.  Mucous membranes are moist.  Eyes:  Extraocular intact. Conjunctivae normal. Pupils are equal, round, and reactive to light.  Cardiovascular: Rate and Rhythm: Normal rate and regular rhythm.  Pulmonary: Non  labored, symmetric rise of chest wall.  Musculoskeletal:  Normal range of motion.  Skin: warm and dry. not jaundiced.  Neurological: No focal deficit present. alert. Oriented. Psychiatric: Mood and Affect congruent.    Condition at discharge: good  The results of significant diagnostics from this hospitalization (including imaging, microbiology, ancillary and laboratory) are listed below for reference.   Imaging Studies: DG CHEST PORT 1 VIEW Result Date: 08/04/2023 CLINICAL DATA:  Chest pain and shortness of breath EXAM: PORTABLE CHEST 1 VIEW COMPARISON:  08/01/2023 FINDINGS: Right IJ CVC tip in the low SVC. Stable cardiomediastinal silhouette. P No focal consolidation, pleural effusion, or pneumothorax. No displaced rib fractures. IMPRESSION: No active disease. Electronically Signed   By: Minerva Fester M.D.   On: 08/04/2023 21:05   VAS [place] LOWER EXTREMITY VENOUS (DVT) Result Date: 08/04/2023  Lower Venous DVT Study Patient Name:  Carol [name]  Date of Exam:   08/03/2023 Medical Rec #: [id number]                Accession #:    [id number] Date of Birth: 1960-10-12                Patient Gender: F Patient Age:   43 years Exam Location:  [facility] Procedure:      VAS [place] LOWER EXTREMITY VENOUS (DVT) Referring Phys: A POWELL JR --------------------------------------------------------------------------------  Indications: Edema.  Risk Factors: CKD V, CHF, cocaine abuse. Comparison Study: Prior negative right LEV done 04/26/18 Performing Technologist: Sherren Kerns RVS  Examination Guidelines: A complete evaluation includes B-mode imaging, spectral Doppler, color Doppler, and power Doppler as needed of all accessible portions of each vessel. Bilateral testing is considered an integral part of a complete examination. Limited examinations for reoccurring indications may be performed as noted. The reflux portion of the exam is performed with the patient in reverse Trendelenburg.   +---------+---------------+---------+-----------+----------+--------------+ RIGHT    CompressibilityPhasicitySpontaneityPropertiesThrombus Aging +---------+---------------+---------+-----------+----------+--------------+ CFV      Full           Yes      Yes                                 +---------+---------------+---------+-----------+----------+--------------+ SFJ      Full                                                        +---------+---------------+---------+-----------+----------+--------------+ FV Prox  Full                                                        +---------+---------------+---------+-----------+----------+--------------+  FV Mid   Full                                                        +---------+---------------+---------+-----------+----------+--------------+ FV DistalFull                                                        +---------+---------------+---------+-----------+----------+--------------+ PFV      Full                                                        +---------+---------------+---------+-----------+----------+--------------+ POP      Full           Yes      Yes                                 +---------+---------------+---------+-----------+----------+--------------+ PTV      Full                                                        +---------+---------------+---------+-----------+----------+--------------+ PERO     Full                                                        +---------+---------------+---------+-----------+----------+--------------+   +---------+---------------+---------+-----------+----------+--------------+ LEFT     CompressibilityPhasicitySpontaneityPropertiesThrombus Aging +---------+---------------+---------+-----------+----------+--------------+ CFV      Full           Yes      Yes                                  +---------+---------------+---------+-----------+----------+--------------+ SFJ      Full                                                        +---------+---------------+---------+-----------+----------+--------------+ FV Prox  Full                                                        +---------+---------------+---------+-----------+----------+--------------+ FV Mid   Full                                                        +---------+---------------+---------+-----------+----------+--------------+  FV DistalFull           Yes      Yes                                 +---------+---------------+---------+-----------+----------+--------------+ PFV      Full                                                        +---------+---------------+---------+-----------+----------+--------------+ POP      Full           Yes      Yes                                 +---------+---------------+---------+-----------+----------+--------------+ PTV      Full                                                        +---------+---------------+---------+-----------+----------+--------------+ PERO     Full                                                        +---------+---------------+---------+-----------+----------+--------------+     Summary: RIGHT: - No evidence of deep vein thrombosis in the lower extremity. No indirect evidence of obstruction proximal to the inguinal ligament.  - No cystic structure found in the popliteal fossa. interstitial edema noted throughout  LEFT: - No evidence of deep vein thrombosis in the lower extremity. No indirect evidence of obstruction proximal to the inguinal ligament.  - No cystic structure found in the popliteal fossa. Interstitial edema noted throughout.  *See table(s) above for measurements and observations. Electronically signed by Heath Lark on 08/04/2023 at 11:09:31 AM.    Final    ECHOCARDIOGRAM COMPLETE Result Date:  08/02/2023    ECHOCARDIOGRAM REPORT   Patient Name:   Carol [name] Date of Exam: 08/02/2023 Medical Rec #:  [id number]               Height:       64.0 in Accession #:    [id number]              Weight:       180.6 lb Date of Birth:  11/13/1960               BSA:          1.873 m Patient Age:    62 years                BP:           162/76 mmHg Patient Gender: F                       HR:           98 bpm. Exam Location:  Inpatient Procedure: 2D Echo, Cardiac Doppler and Color Doppler Indications:  CHF  History:        Patient has prior history of Echocardiogram examinations, most                 recent 03/08/2023. CHF, COPD; Risk Factors:Hypertension and                 Diabetes.  Sonographer:    Darlys Gales Referring Phys: [phone] A CALDWELL POWELL JR IMPRESSIONS  1. LVOT obstruction with flow acceleration, no definite SAM or SAM related MR. Max instantaneous gradient 21 mmHg, velocity 2.3 m/s. Consider cardiac MRI to further evaluate etiology of left ventricular wall thickening.  2. Left ventricular ejection fraction, by estimation, is 65 to 70%. The left ventricle has normal function. The left ventricle has no regional wall motion abnormalities. There is severe left ventricular hypertrophy. Indeterminate diastolic filling due to E-A fusion.  3. Right ventricular systolic function is normal. The right ventricular size is normal. Tricuspid regurgitation signal is inadequate for assessing PA pressure.  4. The mitral valve is grossly normal. Trivial mitral valve regurgitation. No evidence of mitral stenosis.  5. The aortic valve is tricuspid. Aortic valve regurgitation is not visualized. No aortic stenosis is present.  6. The inferior vena cava is normal in size with greater than 50% respiratory variability, suggesting right atrial pressure of 3 mmHg. FINDINGS  Left Ventricle: Left ventricular ejection fraction, by estimation, is 65 to 70%. The left ventricle has normal function. The left ventricle has no  regional wall motion abnormalities. The left ventricular internal cavity size was normal in size. There is  severe left ventricular hypertrophy. Indeterminate diastolic filling due to E-A fusion. Right Ventricle: The right ventricular size is normal. Right vetricular wall thickness was not well visualized. Right ventricular systolic function is normal. Tricuspid regurgitation signal is inadequate for assessing PA pressure. Left Atrium: Left atrial size was normal in size. Right Atrium: Right atrial size was normal in size. Pericardium: There is no evidence of pericardial effusion. Mitral Valve: The mitral valve is grossly normal. Trivial mitral valve regurgitation. No evidence of mitral valve stenosis. Tricuspid Valve: The tricuspid valve is normal in structure. Tricuspid valve regurgitation is trivial. No evidence of tricuspid stenosis. Aortic Valve: The aortic valve is tricuspid. Aortic valve regurgitation is not visualized. No aortic stenosis is present. Aortic valve mean gradient measures 10.0 mmHg. Aortic valve peak gradient measures 14.7 mmHg. Aortic valve area, by VTI measures 2.58 cm. Pulmonic Valve: The pulmonic valve was not well visualized. Pulmonic valve regurgitation is not visualized. No evidence of pulmonic stenosis. Aorta: The aortic root is normal in size and structure. Venous: The inferior vena cava is normal in size with greater than 50% respiratory variability, suggesting right atrial pressure of 3 mmHg. IAS/Shunts: No atrial level shunt detected by color flow Doppler.  LEFT VENTRICLE PLAX 2D LVIDd:         3.00 cm   Diastology LV PW:         1.77 cm   LV e' medial:    4.13 cm/s LV IVS:        1.60 cm   LV E/e' medial:  17.9 LVOT diam:     1.80 cm   LV e' lateral:   4.46 cm/s LV SV:         92        LV E/e' lateral: 16.6 LV SV Index:   49 LVOT Area:     2.54 cm  RIGHT VENTRICLE RV S prime:  16.40 cm/s TAPSE (M-mode): 4.3 cm LEFT ATRIUM             Index        RIGHT ATRIUM          Index LA  Vol (A2C):   30.3 ml 16.18 ml/m  RA Area:     9.09 cm LA Vol (A4C):   64.5 ml 34.43 ml/m  RA Volume:   16.70 ml 8.92 ml/m LA Biplane Vol: 46.0 ml 24.56 ml/m  AORTIC VALVE AV Area (Vmax):    2.33 cm AV Area (Vmean):   2.27 cm AV Area (VTI):     2.58 cm AV Vmax:           192.00 cm/s AV Vmean:          156.000 cm/s AV VTI:            0.355 m AV Peak Grad:      14.7 mmHg AV Mean Grad:      10.0 mmHg LVOT Vmax:         176.00 cm/s LVOT Vmean:        139.000 cm/s LVOT VTI:          0.360 m LVOT/AV VTI ratio: 1.01 MITRAL VALVE MV Area (PHT): 3.07 cm     SHUNTS MV Decel Time: 247 msec     Systemic VTI:  0.36 m MV E velocity: 73.90 cm/s   Systemic Diam: 1.80 cm MV A velocity: 121.00 cm/s MV E/A ratio:  0.61 Weston Brass MD Electronically signed by Weston Brass MD Signature Date/Time: 08/02/2023/3:14:51 PM    Final    DG CHEST PORT 1 VIEW Result Date: 08/01/2023 CLINICAL DATA:  Dialysis catheter in place. EXAM: PORTABLE CHEST 1 VIEW COMPARISON:  Chest radiograph dated 07/30/2023. FINDINGS: Right IJ dialysis catheter with tip at the cavoatrial junction. No pneumothorax. No focal consolidation, pleural effusion. Stable cardiac silhouette. No acute osseous pathology. IMPRESSION: Right IJ dialysis catheter with tip at the cavoatrial junction. No pneumothorax. Electronically Signed   By: Elgie Collard M.D.   On: 08/01/2023 15:56   HYBRID OR IMAGING ([facility] ONLY) Result Date: 08/01/2023 There is no interpretation for this exam.  This order is for images obtained during a surgical procedure.  Please See "Surgeries" Tab for more information regarding the procedure.   VAS [place] UPPER EXT VEIN MAPPING (PRE-OP AVF) Result Date: 07/31/2023 UPPER EXTREMITY VEIN MAPPING Patient Name:  Carol [name]  Date of Exam:   07/31/2023 Medical Rec #: [id number]                Accession #:    [id number] Date of Birth: [birth year]                Patient Gender: F Patient Age:   53 years Exam Location:  [facility] Procedure:      VAS [place] UPPER EXT VEIN MAPPING (PRE-OP AVF) Referring Phys: Remi Deter PEEPLES --------------------------------------------------------------------------------  Indications: Pre-access. Comparison Study: No prior studies. Performing Technologist: Chanda Busing RVT  Examination Guidelines: A complete evaluation includes B-mode imaging, spectral Doppler, color Doppler, and power Doppler as needed of all accessible portions of each vessel. Bilateral testing is considered an integral part of a complete examination. Limited examinations for reoccurring indications may be performed as noted. +-----------------+-------------+----------+---------+ Right Cephalic   Diameter (cm)Depth (cm)Findings  +-----------------+-------------+----------+---------+ Shoulder             0.29        0.80             +-----------------+-------------+----------+---------+  Prox upper arm       0.17        0.70             +-----------------+-------------+----------+---------+ Mid upper arm        0.22        0.27             +-----------------+-------------+----------+---------+ Dist upper arm       0.21        0.42             +-----------------+-------------+----------+---------+ Antecubital fossa    0.43        0.23   branching +-----------------+-------------+----------+---------+ Prox forearm         0.23        0.52   branching +-----------------+-------------+----------+---------+ Mid forearm          0.17        0.24   Thrombus  +-----------------+-------------+----------+---------+ Dist forearm         0.17        0.15             +-----------------+-------------+----------+---------+ +-----------------+-------------+----------+---------+ Right Basilic    Diameter (cm)Depth (cm)Findings  +-----------------+-------------+----------+---------+ Shoulder             0.18        1.00             +-----------------+-------------+----------+---------+ Dist upper  arm       0.13        0.98             +-----------------+-------------+----------+---------+ Antecubital fossa    0.16        0.18   branching +-----------------+-------------+----------+---------+ Prox forearm         0.08        0.13             +-----------------+-------------+----------+---------+ Mid forearm          0.13        0.14             +-----------------+-------------+----------+---------+ Distal forearm       0.08        0.14             +-----------------+-------------+----------+---------+ +-----------------+-------------+----------+---------+ Left Cephalic    Diameter (cm)Depth (cm)Findings  +-----------------+-------------+----------+---------+ Shoulder             0.22        0.96             +-----------------+-------------+----------+---------+ Prox upper arm       0.18        0.52   branching +-----------------+-------------+----------+---------+ Mid upper arm        0.16        0.48             +-----------------+-------------+----------+---------+ Dist upper arm       0.15        0.43             +-----------------+-------------+----------+---------+ Antecubital fossa    0.15        0.61   branching +-----------------+-------------+----------+---------+ Prox forearm         0.17        0.16      IV     +-----------------+-------------+----------+---------+ Mid forearm          0.26        0.16             +-----------------+-------------+----------+---------+ Dist forearm  0.25        0.25             +-----------------+-------------+----------+---------+ +-----------------+-------------+----------+---------+ Left Basilic     Diameter (cm)Depth (cm)Findings  +-----------------+-------------+----------+---------+ Shoulder             0.21        2.10             +-----------------+-------------+----------+---------+ Mid upper arm        0.23        1.50              +-----------------+-------------+----------+---------+ Dist upper arm       0.24        0.84   branching +-----------------+-------------+----------+---------+ Antecubital fossa    0.14        0.74   branching +-----------------+-------------+----------+---------+ Prox forearm         0.13        0.14             +-----------------+-------------+----------+---------+ Mid forearm          0.14        0.17             +-----------------+-------------+----------+---------+ Distal forearm       0.12        0.20             +-----------------+-------------+----------+---------+ *See table(s) above for measurements and observations.  Diagnosing physician: Coral Else MD Electronically signed by Coral Else MD on 07/31/2023 at 9:09:24 PM.    Final    DG Chest 2 View Result Date: 07/30/2023 CLINICAL DATA:  Chest pain, shortness of breath EXAM: CHEST - 2 VIEW COMPARISON:  03/22/2023 FINDINGS: Lungs are clear.  No pleural effusion or pneumothorax. The heart is top-normal in size. Suspected healed fracture deformities of the in the right posterior 6th and left posterior 7th ribs. Thoracic spine is within normal limits. IMPRESSION: Normal chest radiographs. Electronically Signed   By: Charline Bills M.D.   On: 07/30/2023 22:46    Microbiology: Results for orders placed or performed during the hospital encounter of 01/10/22  Resp Panel by RT-PCR (Flu A&B, Covid) Nasopharyngeal Swab     Status: None   Collection Time: 01/10/22  9:49 AM   Specimen: Nasopharyngeal Swab; Nasopharyngeal(NP) swabs in vial transport medium  Result Value Ref Range Status   SARS Coronavirus 2 by RT PCR NEGATIVE NEGATIVE Final    Comment: (NOTE) SARS-CoV-2 target nucleic acids are NOT DETECTED.  The SARS-CoV-2 RNA is generally detectable in upper respiratory specimens during the acute phase of infection. The lowest concentration of SARS-CoV-2 viral copies this assay can detect is 138 copies/mL. A  negative result does not preclude SARS-Cov-2 infection and should not be used as the sole basis for treatment or other patient management decisions. A negative result may occur with  improper specimen collection/handling, submission of specimen other than nasopharyngeal swab, presence of viral mutation(s) within the areas targeted by this assay, and inadequate number of viral copies(<138 copies/mL). A negative result must be combined with clinical observations, patient history, and epidemiological information. The expected result is Negative.  Fact Sheet for Patients:  BloggerCourse.com  Fact Sheet for Healthcare Providers:  SeriousBroker.it  This test is no t yet approved or cleared by the [place] FDA and  has been authorized for detection and/or diagnosis of SARS-CoV-2 by FDA under an Emergency Use Authorization (EUA). This EUA will remain  in effect (meaning this test can be used) for  the duration of the COVID-19 declaration under Section 564(b)(1) of the Act, 21 U.S.C.section 360bbb-3(b)(1), unless the authorization is terminated  or revoked sooner.       Influenza A by PCR NEGATIVE NEGATIVE Final   Influenza B by PCR NEGATIVE NEGATIVE Final    Comment: (NOTE) The Xpert Xpress SARS-CoV-2/FLU/RSV plus assay is intended as an aid in the diagnosis of influenza from Nasopharyngeal swab specimens and should not be used as a sole basis for treatment. Nasal washings and aspirates are unacceptable for Xpert Xpress SARS-CoV-2/FLU/RSV testing.  Fact Sheet for Patients: BloggerCourse.com  Fact Sheet for Healthcare Providers: SeriousBroker.it  This test is not yet approved or cleared by the [place] FDA and has been authorized for detection and/or diagnosis of SARS-CoV-2 by FDA under an Emergency Use Authorization (EUA). This EUA will remain in effect (meaning this test can  be used) for the duration of the COVID-19 declaration under Section 564(b)(1) of the Act, 21 U.S.C. section 360bbb-3(b)(1), unless the authorization is terminated or revoked.  Performed at [facility], [address], [place], [place] [address]     Labs: CBC: Recent Labs  Lab 07/30/23 2236 08/01/23 0234 08/02/23 0301 08/03/23 0230 08/04/23 0251 08/05/23 0248  WBC 5.7 5.0 6.7 6.4  --  6.4  NEUTROABS 3.6  --  3.8 3.0  --  2.6  HGB 10.9* 8.3* 8.1* 7.9* 8.2* 8.2*  HCT 32.7* 24.6* 23.7* 24.4* 25.8* 25.5*  MCV 85.4 85.7 84.9 86.5  --  88.5  PLT 241 223 217 192  --  220   Basic Metabolic Panel: Recent Labs  Lab 07/31/23 1136 08/01/23 0234 08/02/23 0301 08/03/23 0230 08/04/23 0251 08/05/23 0248  NA 135 135 132* 134* 132* 131*  K 3.4* 3.8 3.5 3.7 3.4* 3.7  CL 104 106 102 101 96* 96*  CO2 17* 19* 22 24 30 27   GLUCOSE 147* 77 122* 94 167* 111*  BUN 41* 45* 33* 23* 13 18  CREATININE 7.10* 7.49* 5.57* 4.44* 3.70* 4.64*  CALCIUM 7.7* 7.5* 7.3* 7.8* 7.9* 7.8*  MG 1.4*  --  1.5* 1.6*  --  1.9  PHOS 5.2* 5.8* 3.9 3.9 3.7 4.1   Liver Function Tests: Recent Labs  Lab 07/30/23 2236 07/31/23 1136 08/01/23 0234 08/02/23 0301 08/03/23 0230 08/04/23 0251 08/05/23 0248  AST 33  --   --  13* 13*  --  18  ALT 12  --   --  7 6  --  5  ALKPHOS 119  --   --  95 96  --  85  BILITOT 1.0  --   --  0.5 0.5  --  0.2  PROT 7.0  --   --  4.8*  --   --  4.8*  ALBUMIN 2.3*   < > 1.5* <1.5* <1.5* <1.5* <1.5*   < > = values in this interval not displayed.   CBG: Recent Labs  Lab 08/04/23 0554 08/04/23 1129 08/04/23 1635 08/04/23 2111 08/05/23 1200  GLUCAP 84 125* 123* 122* 110*    Discharge time spent: greater than 30 minutes.  Signed: Debarah Crape, DO [facility] 08/05/2023

## 2023-08-05 NOTE — Progress Notes (Signed)
Mobility Specialist Progress Note:    08/05/23 1249  Mobility  Activity Ambulated with assistance in hallway  Level of Assistance Contact guard assist, steadying assist  Assistive Device Front wheel walker  Distance Ambulated (ft) 100 ft  Activity Response Tolerated well  Mobility Referral Yes  Mobility visit 1 Mobility  Mobility Specialist Start Time (ACUTE ONLY) 1145  Mobility Specialist Stop Time (ACUTE ONLY) 1200  Mobility Specialist Time Calculation (min) (ACUTE ONLY) 15 min   Pt received in bed agreeable to mobility. No physical assistance needed throughout session. Able to ambulate on RA, VSS. Took x1 seated break d/t dizziness, RN aware. Returned to room w/o fault. Call bell and personal belongings in reach. All needs met.  Thompson Grayer Mobility Specialist  Please contact vis Secure Chat or  Rehab Office [phone]

## 2023-08-05 NOTE — Progress Notes (Addendum)
Pt has been accepted at the Transitional Care Unit at [facility] on Mon, Tues, Thurs, Fri with 11:45 am chair time. Pt can start tomorrow and will need to arrive at 11:00 am for paperwork prior to treatment. Met with pt at bedside. Discussed the above details and pt agreeable to plan. Pt provided schedule letter and arrangements added to AVS as well. Update provided to attending, nephrologist, pt's RN, and RN CM. Contacted renal PA to request that orders be sent to clinic today in the event pt stable for d/c today and to start at clinic tomorrow. Will assist as needed.   Olivia Canter Renal Navigator [phone]  Addendum at 4:27 pm: Pt d/c today. Contacted TCU RN to be advised that pt was d/c today and should start tomorrow as planned.

## 2023-08-05 NOTE — TOC Transition Note (Signed)
Transition of Care [facility]) - Discharge Note   Patient Details  Name: Carol [name] MRN: [id number] Date of Birth: [birth year]  Transition of Care [facility]) CM/SW Contact:  Leone Haven, RN Phone Number: 08/05/2023, 3:01 PM   Clinical Narrative:    For dc today, she states she will need a cab voucher, NCM gave her a pill box as well and she will [facility] apts.     Final next level of care: Home/Self Care Barriers to Discharge: Continued Medical Work up   Patient Goals and CMS Choice Patient states their goals for this hospitalization and ongoing recovery are:: return home   Choice offered to / list presented to : NA      Discharge Placement                       Discharge Plan and Services Additional resources added to the After Visit Summary for     Discharge Planning Services: CM Consult Post Acute Care Choice: NA          DME Arranged: N/A DME Agency: NA       HH Arranged: NA          Social Drivers of Health (SDOH) Interventions SDOH Screenings   Food Insecurity: No Food Insecurity (07/31/2023)  Housing: Patient Unable To Answer (08/01/2023)  Transportation Needs: No Transportation Needs (07/31/2023)  Utilities: Not At Risk (07/31/2023)  Financial Resource Strain: Low Risk  (04/04/2022)  Tobacco Use: High Risk (08/01/2023)     Readmission Risk Interventions    08/02/2023    5:29 PM 03/25/2023    4:51 PM 03/27/2022   10:36 AM  Readmission Risk Prevention Plan  Transportation Screening Complete Complete Complete  Medication Review Oceanographer) Complete Complete Complete  PCP or Specialist appointment within 3-5 days of discharge   Complete  HRI or Home Care Consult Complete Complete Complete  SW Recovery Care/Counseling Consult   Complete  Palliative Care Screening Not Applicable Not Applicable Not Applicable  Skilled Nursing Facility Not Applicable Not Applicable Not Applicable

## 2023-08-05 NOTE — Progress Notes (Signed)
08/05/2023  Sheral Flow DOB: [birth year] MRN: [id number]   RIDER WAIVER AND RELEASE OF LIABILITY  For the purposes of helping with transportation needs, [facility] partners with outside transportation providers (taxi companies, [place], Catering manager.) to give [employer] patients or other approved people the choice of on-demand rides [employer]") to our buildings for non-emergency visits.  By using [employer], I, the person signing this document, on behalf of myself and/or any legal minors (in my care using the [employer]), agree:  Science writer given to me are supplied by independent, outside transportation providers who do not work for, or have any affiliation with, [employer]. [facility] is not a transportation company. [facility] has no control over the quality or safety of the rides I get using [employer]. [facility] has no control over whether any outside ride will happen on time or not. [place] gives no guarantee on the reliability, quality, safety, or availability on any rides, or that no mistakes will happen. I know and accept that traveling by vehicle (car, truck, SVU, Zenaida Niece, bus, taxi, etc.) has risks of serious injuries such as disability, being paralyzed, and death. I know and agree the risk of using [employer] is mine alone, and not [employer]. Transport Services are provided "as is" and as are available. The transportation providers are in charge for all inspections and care of the vehicles used to provide these rides. I agree not to take legal action against [place], its agents, employees, officers, directors, representatives, insurers, attorneys, assigns, successors, subsidiaries, and affiliates at any time for any reasons related directly or indirectly to using [employer]. I also agree not to take legal action against [facility] or its affiliates for any injury, death, or damage to property caused by or related to  using [employer]. I have read this Waiver and Release of Liability, and I understand the terms used in it and their legal meaning. This Waiver is freely and voluntarily given with the understanding that my right (or any legal minors) to legal action against [place] relating to [employer] is knowingly given up to use these services.   I attest that I read the Ride Waiver and Release of Liability to Sheral Flow, gave Ms. Chen the opportunity to ask questions and answered the questions asked (if any). I affirm that Sheral Flow then provided consent for assistance with transportation.

## 2023-08-05 NOTE — Progress Notes (Signed)
   08/05/23 1500  Spiritual Encounters  Type of Visit Initial  Care provided to: Pt and family  Conversation partners present during encounter Nurse  Referral source Patient request  Reason for visit Routine spiritual support  OnCall Visit No  Spiritual Framework  Presenting Themes Meaning/purpose/sources of inspiration  Values/beliefs faith  Patient Stress Factors Family relationships;Health changes  Interventions  Spiritual Care Interventions Made Established relationship of care and support;Compassionate presence;Reflective listening;Normalization of emotions;Meaning making;Prayer  Intervention Outcomes  Outcomes Awareness of support   Chaplain responded to request for emotional and spiritual support. Patient's fiancee was present at bedside. Patient is going through spiritual distress because she has not seen her grandchildren for a very long time. She desires that her family would unite together. She believes the stress impact her health negatively. Also, patient belongs to the [place]' faith. She has not attended [facility] for a long time. 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. It brought her peace and comfort. She says she missed that. Chaplain built relationship of care and support and offered a word of prayer for patient. No follow-up needed at this time.

## 2023-08-05 NOTE — Progress Notes (Signed)
Mobility Specialist Progress Note:  Nurse requested Mobility Specialist to perform oxygen saturation test with pt which includes removing pt from oxygen both at rest and while ambulating.  Below are the results from that testing.     Patient Saturations on Room Air at Rest = spO2 98%  Patient Saturations on Room Air while Ambulating = sp02 97% .    At end of testing pt left in room on 0  Liters of oxygen.  Reported results to nurse.    Thompson Grayer Mobility Specialist  Please contact vis Secure Chat or  Rehab Office [phone]

## 2023-08-05 NOTE — Discharge Planning (Signed)
  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, cause of ESRD is most likely HTN 2 - HTN urgency 3 - dyspnea  Other PMHx: T2DM, COPD, sarcoidosis of liver, GERD, Hx cocaine use  Discharge Labs: Basic Metabolic Panel: Recent Labs  Lab 08/03/23 0230 08/04/23 0251 08/05/23 0248  NA 134* 132* 131*  K 3.7 3.4* 3.7  CL 101 96* 96*  CO2 24 30 27   GLUCOSE 94 167* 111*  BUN 23* 13 18  CREATININE 4.44* 3.70* 4.64*  CALCIUM 7.8* 7.9* 7.8*  PHOS 3.9 3.7 4.1   CBC: Recent Labs  Lab 07/30/23 2236 08/01/23 0234 08/02/23 0301 08/03/23 0230 08/04/23 0251 08/05/23 0248  WBC 5.7 5.0 6.7 6.4  --  6.4  NEUTROABS 3.6  --  3.8 3.0  --  2.6  HGB 10.9* 8.3* 8.1* 7.9* 8.2* 8.2*  HCT 32.7* 24.6* 23.7* 24.4* 25.8* 25.5*  MCV 85.4 85.7 84.9 86.5  --  88.5  PLT 241 223 217 192  --  220    Dialysis Orders: TCU - 4d/week, 3hr 180 dialyzer BFR 400 DFR A1.5 Bath  3K/2.5Ca EDW 78kg ACCESS: TDC and 1st BB AVF 08/01/23 (maturing)  Medications: HEPARIN 2000 units q HD bolus, TDC locks MIRCERA q 2 weeks (Ok to start 12/19) VENOFER 100mg  IV q HD x 10 dose (tsat 13% on 12/13) CALCITRIOL 0.82mcg PO q HD (PTH 234 on admit) ONSP (BARS) 1 q HD - Alb < 1.5  Other/Appts/Lab orders: - Monthly labs on admit, check K weekly. Check Mg monthly - VVS f/u for AVF 09/06/23   Julien Nordmann, PA-C 08/05/2023, 1:57 PM  [facility] [phone]

## 2023-08-05 NOTE — Progress Notes (Signed)
Nephrology Follow-Up Consult note   Assessment/Recommendations: Carol [name] is a/an 62 y.o. female with a past medical history significant for DM2, CHF, COPD, CKD5, admitted for ESRD.      ESRD: progressive CKD V now ESRD. -Status post [facility] and first stage BB AVF on 12/12; appreciate help -Continue dialysis on TTS schedule -- next 12/17. Inpatient or outpatient -CLIP completed to [facility] TCU starting 12/17 -Continue to monitor daily Cr, Dose meds for GFR -Monitor Daily I/Os, Daily weight  -Avoid nephrotoxic medications including NSAIDs -Use synthetic opioids (Fentanyl/Dilaudid) if needed  Anemia of CKD: Hemoglobin low.  Repeat iron stores demonstrate deficiency.  IV iron ordered.  ESA on qThurs  Secondary hyperparathyroidism: PTH 234.  Calcium corrects to normal.  Phosphorus normal. On C3  Hypertension: Blood pressure has improved overall improved.  Fluctuating some.  Continue with ultrafiltration on dialysis.  Continue current medications; will manage as outpatient  [name] 08/05/2023 11:40 AM  ___________________________________________________________  CC: Chest pain  Interval History/Subjective:  CP and anxiety overnight CLIP to TCU completed  Medications:  Current Facility-Administered Medications  Medication Dose Route Frequency Provider Last Rate Last Admin   acetaminophen (TYLENOL) tablet 650 mg  650 mg Oral Q6H PRN Madelyn Flavors A, MD   650 mg at 08/04/23 2023   Or   acetaminophen (TYLENOL) suppository 650 mg  650 mg Rectal Q6H PRN Madelyn Flavors A, MD       albuterol (PROVENTIL) (2.5 MG/3ML) 0.083% nebulizer solution 2.5 mg  2.5 mg Nebulization Q6H PRN Katrinka Blazing, Rondell A, MD       amLODipine (NORVASC) tablet 10 mg  10 mg Oral Daily Smith, Rondell A, MD   10 mg at 08/05/23 1006   arformoterol (BROVANA) nebulizer solution 15 mcg  15 mcg Nebulization BID Madelyn Flavors A, MD   15 mcg at 08/05/23 1610   aspirin EC tablet 81 mg   81 mg Oral Daily Smith, Rondell A, MD   81 mg at 08/05/23 1006   atorvastatin (LIPITOR) tablet 80 mg  80 mg Oral Daily Smith, Rondell A, MD   80 mg at 08/05/23 1006   budesonide (PULMICORT) nebulizer solution 0.5 mg  0.5 mg Nebulization BID Madelyn Flavors A, MD   0.5 mg at 08/05/23 0830   calcitRIOL (ROCALTROL) capsule 0.25 mcg  0.25 mcg Oral Daily Katrinka Blazing, Rondell A, MD   0.25 mcg at 08/05/23 1006   Chlorhexidine Gluconate Cloth 2 % PADS 6 each  6 each Topical Q0600 Darnell Level, MD   6 each at 08/05/23 [phone]   cyanocobalamin (VITAMIN B12) tablet 1,000 mcg  1,000 mcg Oral Daily Zigmund Daniel., MD   1,000 mcg at 08/05/23 1006   Darbepoetin Alfa (ARANESP) injection 60 mcg  60 mcg Subcutaneous Q Thu-1800 Darnell Level, MD   60 mcg at 08/01/23 2305   folic acid (FOLVITE) tablet 1 mg  1 mg Oral Daily Zigmund Daniel., MD   1 mg at 08/05/23 1006   gabapentin (NEURONTIN) capsule 100 mg  100 mg Oral BID Madelyn Flavors A, MD   100 mg at 08/05/23 1006   heparin injection 5,000 Units  5,000 Units Subcutaneous Q8H Madelyn Flavors A, MD   5,000 Units at 08/05/23 0648   hydrALAZINE (APRESOLINE) injection 10 mg  10 mg Intravenous Q4H PRN Madelyn Flavors A, MD   10 mg at 07/31/23 0940   hydrALAZINE (APRESOLINE) tablet 100 mg  100 mg Oral Q8H Clydie Braun, MD  100 mg at 08/05/23 1610   iron sucrose (VENOFER) 200 mg in sodium chloride 0.9 % 100 mL IVPB  200 mg Intravenous Q24H Zigmund Daniel., MD 440 mL/hr at 08/05/23 1034 200 mg at 08/05/23 1034   linagliptin (TRADJENTA) tablet 5 mg  5 mg Oral QHS Zigmund Daniel., MD   5 mg at 08/04/23 2024   loratadine (CLARITIN) tablet 10 mg  10 mg Oral Daily Madelyn Flavors A, MD   10 mg at 08/05/23 1006   magnesium oxide (MAG-OX) tablet 400 mg  400 mg Oral Daily Zigmund Daniel., MD   400 mg at 08/05/23 1006   melatonin tablet 5 mg  5 mg Oral QHS PRN Clydie Braun, MD   5 mg at 08/01/23 2307   naphazoline-pheniramine (NAPHCON-A)  0.025-0.3 % ophthalmic solution 2 drop  2 drop Both Eyes BID Madelyn Flavors A, MD   2 drop at 08/05/23 1007   nitroGLYCERIN (NITROSTAT) SL tablet 0.4 mg  0.4 mg Sublingual Q5 min PRN Achille Rich, PA-C   0.4 mg at 07/30/23 2246   oxyCODONE-acetaminophen (PERCOCET/ROXICET) 5-325 MG per tablet 1 tablet  1 tablet Oral Q4H PRN Clydie Braun, MD   1 tablet at 08/05/23 1030   And   oxyCODONE (Oxy IR/ROXICODONE) immediate release tablet 5 mg  5 mg Oral Q4H PRN Madelyn Flavors A, MD   5 mg at 08/05/23 1030   sodium chloride flush (NS) 0.9 % injection 3 mL  3 mL Intravenous Q12H Smith, Rondell A, MD   3 mL at 08/05/23 1007   triamcinolone ointment (KENALOG) 0.1 % 1 Application  1 Application Topical BID PRN Clydie Braun, MD       trimethobenzamide Dorothey Baseman) injection 200 mg  200 mg Intramuscular Q6H PRN Madelyn Flavors A, MD   200 mg at 08/05/23 0858   zolpidem (AMBIEN) tablet 5 mg  5 mg Oral QHS PRN Madelyn Flavors A, MD   5 mg at 08/04/23 2027      Review of Systems: 10 systems reviewed and negative except per interval history/subjective  Physical Exam: Vitals:   08/05/23 0833 08/05/23 0903  BP:  (!) 166/72  Pulse:    Resp:  17  Temp:  98 F (36.7 C)  SpO2: 98% 98%   No intake/output data recorded.  Intake/Output Summary (Last 24 hours) at 08/05/2023 1140 Last data filed at 08/05/2023 0024 Gross per 24 hour  Intake 350.41 ml  Output --  Net 350.41 ml   Constitutional: well-appearing, no distress ENMT: ears and nose without scars or lesions, MMM CV: normal rate, no edema Respiratory: Bilateral chest rise with no increased work of breathing Gastrointestinal: soft, non-tender, no palpable masses or hernias Skin: no visible lesions or rashes Psych: alert, judgement/insight appropriate   Test Results I personally reviewed new and old clinical labs and radiology tests Lab Results  Component Value Date   NA 131 (L) 08/05/2023   K 3.7 08/05/2023   CL 96 (L) 08/05/2023   CO2 27  08/05/2023   BUN 18 08/05/2023   CREATININE 4.64 (H) 08/05/2023   GFR 61.90 07/30/2014   CALCIUM 7.8 (L) 08/05/2023   ALBUMIN <1.5 (L) 08/05/2023   PHOS 4.1 08/05/2023    CBC Recent Labs  Lab 08/02/23 0301 08/03/23 0230 08/04/23 0251 08/05/23 0248  WBC 6.7 6.4  --  6.4  NEUTROABS 3.8 3.0  --  2.6  HGB 8.1* 7.9* 8.2* 8.2*  HCT 23.7* 24.4* 25.8* 25.5*  MCV 84.9 86.5  --  88.5  PLT 217 192  --  220

## 2023-08-10 ENCOUNTER — Inpatient Hospital Stay (HOSPITAL_COMMUNITY)
Admission: EM | Admit: 2023-08-10 | Discharge: 2023-08-14 | DRG: 637 | Disposition: A | Discharge status: Home / Self Care | Payer: 59 | Attending: Family Medicine | Admitting: Family Medicine

## 2023-08-10 ENCOUNTER — Emergency Department (HOSPITAL_COMMUNITY): Payer: 59

## 2023-08-10 DIAGNOSIS — E162 Hypoglycemia, unspecified: Secondary | ICD-10-CM | POA: Diagnosis not present

## 2023-08-10 DIAGNOSIS — Z7984 Long term (current) use of oral hypoglycemic drugs: Secondary | ICD-10-CM

## 2023-08-10 DIAGNOSIS — G20A1 Parkinson's disease without dyskinesia, without mention of fluctuations: Secondary | ICD-10-CM | POA: Diagnosis present

## 2023-08-10 DIAGNOSIS — N186 End stage renal disease: Secondary | ICD-10-CM | POA: Diagnosis present

## 2023-08-10 DIAGNOSIS — Z7982 Long term (current) use of aspirin: Secondary | ICD-10-CM

## 2023-08-10 DIAGNOSIS — N19 Unspecified kidney failure: Secondary | ICD-10-CM | POA: Diagnosis present

## 2023-08-10 DIAGNOSIS — Z8249 Family history of ischemic heart disease and other diseases of the circulatory system: Secondary | ICD-10-CM

## 2023-08-10 DIAGNOSIS — Z555 Less than a high school diploma: Secondary | ICD-10-CM

## 2023-08-10 DIAGNOSIS — Z79899 Other long term (current) drug therapy: Secondary | ICD-10-CM

## 2023-08-10 DIAGNOSIS — F419 Anxiety disorder, unspecified: Secondary | ICD-10-CM | POA: Diagnosis present

## 2023-08-10 DIAGNOSIS — J449 Chronic obstructive pulmonary disease, unspecified: Secondary | ICD-10-CM | POA: Diagnosis present

## 2023-08-10 DIAGNOSIS — E11649 Type 2 diabetes mellitus with hypoglycemia without coma: Principal | ICD-10-CM | POA: Diagnosis present

## 2023-08-10 DIAGNOSIS — R9431 Abnormal electrocardiogram [ECG] [EKG]: Secondary | ICD-10-CM | POA: Diagnosis present

## 2023-08-10 DIAGNOSIS — Z885 Allergy status to narcotic agent status: Secondary | ICD-10-CM

## 2023-08-10 DIAGNOSIS — F32A Depression, unspecified: Secondary | ICD-10-CM | POA: Diagnosis present

## 2023-08-10 DIAGNOSIS — Z992 Dependence on renal dialysis: Secondary | ICD-10-CM

## 2023-08-10 DIAGNOSIS — E785 Hyperlipidemia, unspecified: Secondary | ICD-10-CM | POA: Diagnosis present

## 2023-08-10 DIAGNOSIS — I10 Essential (primary) hypertension: Secondary | ICD-10-CM | POA: Diagnosis present

## 2023-08-10 DIAGNOSIS — Z8709 Personal history of other diseases of the respiratory system: Secondary | ICD-10-CM

## 2023-08-10 DIAGNOSIS — E1122 Type 2 diabetes mellitus with diabetic chronic kidney disease: Secondary | ICD-10-CM | POA: Diagnosis present

## 2023-08-10 DIAGNOSIS — F1410 Cocaine abuse, uncomplicated: Secondary | ICD-10-CM | POA: Diagnosis present

## 2023-08-10 DIAGNOSIS — K219 Gastro-esophageal reflux disease without esophagitis: Secondary | ICD-10-CM | POA: Diagnosis present

## 2023-08-10 DIAGNOSIS — N189 Chronic kidney disease, unspecified: Secondary | ICD-10-CM

## 2023-08-10 DIAGNOSIS — I120 Hypertensive chronic kidney disease with stage 5 chronic kidney disease or end stage renal disease: Secondary | ICD-10-CM | POA: Diagnosis present

## 2023-08-10 DIAGNOSIS — Z825 Family history of asthma and other chronic lower respiratory diseases: Secondary | ICD-10-CM

## 2023-08-10 DIAGNOSIS — Z7951 Long term (current) use of inhaled steroids: Secondary | ICD-10-CM

## 2023-08-10 DIAGNOSIS — D631 Anemia in chronic kidney disease: Secondary | ICD-10-CM | POA: Diagnosis present

## 2023-08-10 DIAGNOSIS — E8889 Other specified metabolic disorders: Secondary | ICD-10-CM | POA: Diagnosis present

## 2023-08-10 DIAGNOSIS — M549 Dorsalgia, unspecified: Secondary | ICD-10-CM | POA: Diagnosis present

## 2023-08-10 DIAGNOSIS — G8929 Other chronic pain: Secondary | ICD-10-CM | POA: Diagnosis present

## 2023-08-10 DIAGNOSIS — K732 Chronic active hepatitis, not elsewhere classified: Secondary | ICD-10-CM | POA: Diagnosis present

## 2023-08-10 DIAGNOSIS — F17210 Nicotine dependence, cigarettes, uncomplicated: Secondary | ICD-10-CM | POA: Diagnosis present

## 2023-08-10 DIAGNOSIS — Z880 Allergy status to penicillin: Secondary | ICD-10-CM

## 2023-08-10 DIAGNOSIS — G9341 Metabolic encephalopathy: Secondary | ICD-10-CM | POA: Diagnosis present

## 2023-08-10 DIAGNOSIS — E876 Hypokalemia: Secondary | ICD-10-CM | POA: Diagnosis present

## 2023-08-10 DIAGNOSIS — Z841 Family history of disorders of kidney and ureter: Secondary | ICD-10-CM

## 2023-08-10 DIAGNOSIS — Z862 Personal history of diseases of the blood and blood-forming organs and certain disorders involving the immune mechanism: Secondary | ICD-10-CM

## 2023-08-10 DIAGNOSIS — E119 Type 2 diabetes mellitus without complications: Secondary | ICD-10-CM

## 2023-08-10 DIAGNOSIS — Z833 Family history of diabetes mellitus: Secondary | ICD-10-CM

## 2023-08-10 DIAGNOSIS — I252 Old myocardial infarction: Secondary | ICD-10-CM

## 2023-08-10 HISTORY — DX: End stage renal disease: N18.6

## 2023-08-10 HISTORY — DX: Dependence on renal dialysis: Z99.2

## 2023-08-10 LAB — CBG MONITORING, ED: Glucose-Capillary: 68 mg/dL — ABNORMAL LOW (ref 70–99)

## 2023-08-10 MED ORDER — DEXTROSE 50 % IV SOLN
12.5000 g | Freq: Once | INTRAVENOUS | Status: AC
  Administered 2023-08-11: 12.5 g via INTRAVENOUS
  Filled 2023-08-10: qty 50

## 2023-08-10 NOTE — ED Triage Notes (Signed)
Pt arrived from home via [facility] c/o hypoglycemia EMS lowest 54 current 68. Pt ate 1/2 peanut butter sandwich for ems prior to being transferred.

## 2023-08-11 ENCOUNTER — Emergency Department (HOSPITAL_COMMUNITY): Payer: 59

## 2023-08-11 ENCOUNTER — Encounter (HOSPITAL_COMMUNITY): Payer: Self-pay | Admitting: Internal Medicine

## 2023-08-11 DIAGNOSIS — K732 Chronic active hepatitis, not elsewhere classified: Secondary | ICD-10-CM | POA: Diagnosis present

## 2023-08-11 DIAGNOSIS — E876 Hypokalemia: Secondary | ICD-10-CM | POA: Diagnosis present

## 2023-08-11 DIAGNOSIS — F17210 Nicotine dependence, cigarettes, uncomplicated: Secondary | ICD-10-CM | POA: Diagnosis present

## 2023-08-11 DIAGNOSIS — G20A1 Parkinson's disease without dyskinesia, without mention of fluctuations: Secondary | ICD-10-CM | POA: Diagnosis present

## 2023-08-11 DIAGNOSIS — I10 Essential (primary) hypertension: Secondary | ICD-10-CM | POA: Diagnosis not present

## 2023-08-11 DIAGNOSIS — E119 Type 2 diabetes mellitus without complications: Secondary | ICD-10-CM

## 2023-08-11 DIAGNOSIS — E162 Hypoglycemia, unspecified: Secondary | ICD-10-CM | POA: Diagnosis present

## 2023-08-11 DIAGNOSIS — E785 Hyperlipidemia, unspecified: Secondary | ICD-10-CM | POA: Diagnosis present

## 2023-08-11 DIAGNOSIS — R9431 Abnormal electrocardiogram [ECG] [EKG]: Secondary | ICD-10-CM

## 2023-08-11 DIAGNOSIS — K219 Gastro-esophageal reflux disease without esophagitis: Secondary | ICD-10-CM | POA: Diagnosis present

## 2023-08-11 DIAGNOSIS — M549 Dorsalgia, unspecified: Secondary | ICD-10-CM | POA: Diagnosis present

## 2023-08-11 DIAGNOSIS — G9341 Metabolic encephalopathy: Secondary | ICD-10-CM

## 2023-08-11 DIAGNOSIS — E1122 Type 2 diabetes mellitus with diabetic chronic kidney disease: Secondary | ICD-10-CM | POA: Diagnosis present

## 2023-08-11 DIAGNOSIS — N186 End stage renal disease: Secondary | ICD-10-CM | POA: Diagnosis present

## 2023-08-11 DIAGNOSIS — Z8249 Family history of ischemic heart disease and other diseases of the circulatory system: Secondary | ICD-10-CM | POA: Diagnosis not present

## 2023-08-11 DIAGNOSIS — F1410 Cocaine abuse, uncomplicated: Secondary | ICD-10-CM | POA: Diagnosis present

## 2023-08-11 DIAGNOSIS — G8929 Other chronic pain: Secondary | ICD-10-CM | POA: Diagnosis present

## 2023-08-11 DIAGNOSIS — D631 Anemia in chronic kidney disease: Secondary | ICD-10-CM | POA: Diagnosis present

## 2023-08-11 DIAGNOSIS — I120 Hypertensive chronic kidney disease with stage 5 chronic kidney disease or end stage renal disease: Secondary | ICD-10-CM | POA: Diagnosis present

## 2023-08-11 DIAGNOSIS — Z8709 Personal history of other diseases of the respiratory system: Secondary | ICD-10-CM

## 2023-08-11 DIAGNOSIS — F32A Depression, unspecified: Secondary | ICD-10-CM | POA: Diagnosis present

## 2023-08-11 DIAGNOSIS — I252 Old myocardial infarction: Secondary | ICD-10-CM | POA: Diagnosis not present

## 2023-08-11 DIAGNOSIS — E11649 Type 2 diabetes mellitus with hypoglycemia without coma: Secondary | ICD-10-CM | POA: Diagnosis present

## 2023-08-11 DIAGNOSIS — Z992 Dependence on renal dialysis: Secondary | ICD-10-CM | POA: Diagnosis not present

## 2023-08-11 DIAGNOSIS — Z862 Personal history of diseases of the blood and blood-forming organs and certain disorders involving the immune mechanism: Secondary | ICD-10-CM

## 2023-08-11 DIAGNOSIS — J449 Chronic obstructive pulmonary disease, unspecified: Secondary | ICD-10-CM | POA: Diagnosis present

## 2023-08-11 DIAGNOSIS — N189 Chronic kidney disease, unspecified: Secondary | ICD-10-CM

## 2023-08-11 DIAGNOSIS — Z841 Family history of disorders of kidney and ureter: Secondary | ICD-10-CM | POA: Diagnosis not present

## 2023-08-11 DIAGNOSIS — E8889 Other specified metabolic disorders: Secondary | ICD-10-CM | POA: Diagnosis present

## 2023-08-11 LAB — PROCALCITONIN: Procalcitonin: 0.12 ng/mL

## 2023-08-11 LAB — PROTIME-INR
INR: >10 (ref 0.8–1.2)
INR: 1 (ref 0.8–1.2)
Prothrombin Time: >90 s — ABNORMAL HIGH (ref 11.4–15.2)
Prothrombin Time: 12.8 s (ref 11.4–15.2)

## 2023-08-11 LAB — COMPREHENSIVE METABOLIC PANEL
ALT: 10 U/L (ref 0–44)
ALT: 11 U/L (ref 0–44)
AST: 26 U/L (ref 15–41)
AST: 29 U/L (ref 15–41)
Albumin: 1.7 g/dL — ABNORMAL LOW (ref 3.5–5.0)
Albumin: 2 g/dL — ABNORMAL LOW (ref 3.5–5.0)
Alkaline Phosphatase: 107 U/L (ref 38–126)
Alkaline Phosphatase: 117 U/L (ref 38–126)
Anion gap: 5 (ref 5–15)
Anion gap: 9 (ref 5–15)
BUN: 13 mg/dL (ref 6–20)
BUN: 15 mg/dL (ref 6–20)
CO2: 24 mmol/L (ref 22–32)
CO2: 26 mmol/L (ref 22–32)
Calcium: 7.7 mg/dL — ABNORMAL LOW (ref 8.9–10.3)
Calcium: 8.3 mg/dL — ABNORMAL LOW (ref 8.9–10.3)
Chloride: 100 mmol/L (ref 98–111)
Chloride: 105 mmol/L (ref 98–111)
Creatinine, Ser: 3.55 mg/dL — ABNORMAL HIGH (ref 0.44–1.00)
Creatinine, Ser: 3.66 mg/dL — ABNORMAL HIGH (ref 0.44–1.00)
GFR, Estimated: 14 mL/min — ABNORMAL LOW (ref >60)
GFR, Estimated: 14 mL/min — ABNORMAL LOW (ref >60)
Glucose, Bld: 110 mg/dL — ABNORMAL HIGH (ref 70–99)
Glucose, Bld: 74 mg/dL (ref 70–99)
Potassium: 3.3 mmol/L — ABNORMAL LOW (ref 3.5–5.1)
Potassium: 3.4 mmol/L — ABNORMAL LOW (ref 3.5–5.1)
Sodium: 134 mmol/L — ABNORMAL LOW (ref 135–145)
Sodium: 135 mmol/L (ref 135–145)
Total Bilirubin: 0.3 mg/dL (ref <1.2)
Total Bilirubin: 0.4 mg/dL (ref <1.2)
Total Protein: 5.3 g/dL — ABNORMAL LOW (ref 6.5–8.1)
Total Protein: 6.5 g/dL (ref 6.5–8.1)

## 2023-08-11 LAB — CBC WITH DIFFERENTIAL/PLATELET
Abs Immature Granulocytes: 0.11 10*3/uL — ABNORMAL HIGH (ref 0.00–0.07)
Abs Immature Granulocytes: 0.16 10*3/uL — ABNORMAL HIGH (ref 0.00–0.07)
Basophils Absolute: 0 10*3/uL (ref 0.0–0.1)
Basophils Absolute: 0 10*3/uL (ref 0.0–0.1)
Basophils Relative: 0 %
Basophils Relative: 0 %
Eosinophils Absolute: 0 10*3/uL (ref 0.0–0.5)
Eosinophils Absolute: 0.1 10*3/uL (ref 0.0–0.5)
Eosinophils Relative: 0 %
Eosinophils Relative: 1 %
HCT: 26 % — ABNORMAL LOW (ref 36.0–46.0)
HCT: 30.2 % — ABNORMAL LOW (ref 36.0–46.0)
Hemoglobin: 8.1 g/dL — ABNORMAL LOW (ref 12.0–15.0)
Hemoglobin: 9.4 g/dL — ABNORMAL LOW (ref 12.0–15.0)
Immature Granulocytes: 2 %
Immature Granulocytes: 2 %
Lymphocytes Relative: 10 %
Lymphocytes Relative: 14 %
Lymphs Abs: 0.7 10*3/uL (ref 0.7–4.0)
Lymphs Abs: 1 10*3/uL (ref 0.7–4.0)
MCH: 28.2 pg (ref 26.0–34.0)
MCH: 28.5 pg (ref 26.0–34.0)
MCHC: 31.1 g/dL (ref 30.0–36.0)
MCHC: 31.2 g/dL (ref 30.0–36.0)
MCV: 90.6 fL (ref 80.0–100.0)
MCV: 91.5 fL (ref 80.0–100.0)
Monocytes Absolute: 0.5 10*3/uL (ref 0.1–1.0)
Monocytes Absolute: 0.9 10*3/uL (ref 0.1–1.0)
Monocytes Relative: 12 %
Monocytes Relative: 6 %
Neutro Abs: 5.1 10*3/uL (ref 1.7–7.7)
Neutro Abs: 6.4 10*3/uL (ref 1.7–7.7)
Neutrophils Relative %: 71 %
Neutrophils Relative %: 82 %
Platelets: 214 10*3/uL (ref 150–400)
Platelets: 247 10*3/uL (ref 150–400)
RBC: 2.87 MIL/uL — ABNORMAL LOW (ref 3.87–5.11)
RBC: 3.3 MIL/uL — ABNORMAL LOW (ref 3.87–5.11)
RDW: 15 % (ref 11.5–15.5)
RDW: 15.1 % (ref 11.5–15.5)
WBC: 7.3 10*3/uL (ref 4.0–10.5)
WBC: 7.8 10*3/uL (ref 4.0–10.5)
nRBC: 0.3 % — ABNORMAL HIGH (ref 0.0–0.2)
nRBC: 0.4 % — ABNORMAL HIGH (ref 0.0–0.2)

## 2023-08-11 LAB — CBG MONITORING, ED
Glucose-Capillary: 105 mg/dL — ABNORMAL HIGH (ref 70–99)
Glucose-Capillary: 107 mg/dL — ABNORMAL HIGH (ref 70–99)
Glucose-Capillary: 112 mg/dL — ABNORMAL HIGH (ref 70–99)
Glucose-Capillary: 20 mg/dL — CL (ref 70–99)
Glucose-Capillary: 27 mg/dL — CL (ref 70–99)
Glucose-Capillary: 39 mg/dL — CL (ref 70–99)
Glucose-Capillary: 42 mg/dL — CL (ref 70–99)
Glucose-Capillary: 43 mg/dL — CL (ref 70–99)
Glucose-Capillary: 45 mg/dL — ABNORMAL LOW (ref 70–99)
Glucose-Capillary: 46 mg/dL — ABNORMAL LOW (ref 70–99)
Glucose-Capillary: 57 mg/dL — ABNORMAL LOW (ref 70–99)
Glucose-Capillary: 73 mg/dL (ref 70–99)
Glucose-Capillary: 79 mg/dL (ref 70–99)
Glucose-Capillary: 81 mg/dL (ref 70–99)

## 2023-08-11 LAB — BLOOD GAS, VENOUS
Acid-Base Excess: 6.6 mmol/L — ABNORMAL HIGH (ref 0.0–2.0)
Bicarbonate: 28.9 mmol/L — ABNORMAL HIGH (ref 20.0–28.0)
O2 Saturation: 99.9 %
Patient temperature: 36.8
pCO2, Ven: 33 mm[Hg] — ABNORMAL LOW (ref 44–60)
pH, Ven: 7.55 — ABNORMAL HIGH (ref 7.25–7.43)
pO2, Ven: 132 mm[Hg] — ABNORMAL HIGH (ref 32–45)

## 2023-08-11 LAB — VITAMIN B12: Vitamin B-12: 424 pg/mL (ref 180–914)

## 2023-08-11 LAB — URINALYSIS, W/ REFLEX TO CULTURE (INFECTION SUSPECTED)
Bilirubin Urine: NEGATIVE
Glucose, UA: 50 mg/dL — AB
Hgb urine dipstick: NEGATIVE
Ketones, ur: NEGATIVE mg/dL
Leukocytes,Ua: NEGATIVE
Nitrite: NEGATIVE
Protein, ur: >=300 mg/dL — AB
Specific Gravity, Urine: 1.004 — ABNORMAL LOW (ref 1.005–1.030)
pH: 8 (ref 5.0–8.0)

## 2023-08-11 LAB — BRAIN NATRIURETIC PEPTIDE: B Natriuretic Peptide: 1479.1 pg/mL — ABNORMAL HIGH (ref 0.0–100.0)

## 2023-08-11 LAB — TROPONIN I (HIGH SENSITIVITY)
Troponin I (High Sensitivity): 23 ng/L — ABNORMAL HIGH (ref <18)
Troponin I (High Sensitivity): 23 ng/L — ABNORMAL HIGH (ref <18)

## 2023-08-11 LAB — MAGNESIUM
Magnesium: 1.6 mg/dL — ABNORMAL LOW (ref 1.7–2.4)
Magnesium: 1.6 mg/dL — ABNORMAL LOW (ref 1.7–2.4)

## 2023-08-11 LAB — TSH: TSH: 0.878 u[IU]/mL (ref 0.350–4.500)

## 2023-08-11 LAB — PHOSPHORUS: Phosphorus: 1.6 mg/dL — ABNORMAL LOW (ref 2.5–4.6)

## 2023-08-11 MED ORDER — LIDOCAINE HCL (PF) 1 % IJ SOLN: 5.0000 mL | INTRAMUSCULAR | Status: DC | PRN

## 2023-08-11 MED ORDER — ZOLPIDEM TARTRATE 5 MG PO TABS
5.0000 mg | ORAL_TABLET | Freq: Every evening | ORAL | Status: DC | PRN
  Administered 2023-08-11 – 2023-08-13 (×3): 5 mg via ORAL
  Filled 2023-08-11 (×3): qty 1

## 2023-08-11 MED ORDER — ATORVASTATIN CALCIUM 80 MG PO TABS
80.0000 mg | ORAL_TABLET | Freq: Every day | ORAL | Status: DC
  Administered 2023-08-11 – 2023-08-14 (×4): 80 mg via ORAL
  Filled 2023-08-11 (×3): qty 1
  Filled 2023-08-11: qty 2

## 2023-08-11 MED ORDER — ASPIRIN 81 MG PO TBEC
81.0000 mg | DELAYED_RELEASE_TABLET | Freq: Every day | ORAL | Status: DC
  Administered 2023-08-11 – 2023-08-14 (×4): 81 mg via ORAL
  Filled 2023-08-11 (×4): qty 1

## 2023-08-11 MED ORDER — MOMETASONE FURO-FORMOTEROL FUM 200-5 MCG/ACT IN AERO
2.0000 | INHALATION_SPRAY | Freq: Two times a day (BID) | RESPIRATORY_TRACT | Status: DC
  Administered 2023-08-13 – 2023-08-14 (×2): 2 via RESPIRATORY_TRACT
  Filled 2023-08-11 (×2): qty 8.8

## 2023-08-11 MED ORDER — ISOSORB DINITRATE-HYDRALAZINE 20-37.5 MG PO TABS
2.0000 | ORAL_TABLET | Freq: Three times a day (TID) | ORAL | Status: DC
Start: 2023-08-11 — End: 2023-08-14
  Administered 2023-08-11 – 2023-08-14 (×7): 2 via ORAL
  Filled 2023-08-11 (×7): qty 2

## 2023-08-11 MED ORDER — CHLORHEXIDINE GLUCONATE CLOTH 2 % EX PADS
6.0000 | MEDICATED_PAD | Freq: Every day | CUTANEOUS | Status: DC
  Administered 2023-08-12 – 2023-08-14 (×3): 6 via TOPICAL

## 2023-08-11 MED ORDER — DEXTROSE 50 % IV SOLN: 50.0000 mL | Freq: Once | INTRAVENOUS | Status: AC

## 2023-08-11 MED ORDER — HEPARIN SODIUM (PORCINE) 1000 UNIT/ML IJ SOLN
3200.0000 [IU] | Freq: Once | INTRAMUSCULAR | Status: AC
  Administered 2023-08-11: 3200 [IU]
  Filled 2023-08-11: qty 4

## 2023-08-11 MED ORDER — AMLODIPINE BESYLATE 5 MG PO TABS
10.0000 mg | ORAL_TABLET | Freq: Every day | ORAL | Status: DC
  Administered 2023-08-11 – 2023-08-14 (×4): 10 mg via ORAL
  Filled 2023-08-11 (×4): qty 2
  Filled 2023-08-11: qty 4

## 2023-08-11 MED ORDER — ONDANSETRON HCL 4 MG/2ML IJ SOLN: 4.0000 mg | Freq: Four times a day (QID) | INTRAMUSCULAR | Status: DC | PRN

## 2023-08-11 MED ORDER — DEXTROSE 10 % IV SOLN: INTRAVENOUS | Status: DC

## 2023-08-11 MED ORDER — OXYCODONE-ACETAMINOPHEN 5-325 MG PO TABS
2.0000 | ORAL_TABLET | Freq: Once | ORAL | Status: AC
  Administered 2023-08-11: 2 via ORAL
  Filled 2023-08-11: qty 2

## 2023-08-11 MED ORDER — CALCITRIOL 0.25 MCG PO CAPS
0.2500 ug | ORAL_CAPSULE | Freq: Every day | ORAL | Status: DC
Start: 2023-08-11 — End: 2023-08-11
  Administered 2023-08-11: 0.25 ug via ORAL
  Filled 2023-08-11: qty 1

## 2023-08-11 MED ORDER — PENTAFLUOROPROP-TETRAFLUOROETH EX AERO: 1.0000 | INHALATION_SPRAY | CUTANEOUS | Status: DC | PRN

## 2023-08-11 MED ORDER — DEXTROSE 50 % IV SOLN: 1.0000 | Freq: Once | INTRAVENOUS | Status: AC

## 2023-08-11 MED ORDER — DEXTROSE 50 % IV SOLN
INTRAVENOUS | Status: AC
  Administered 2023-08-11: 50 mL via INTRAVENOUS
  Filled 2023-08-11: qty 50

## 2023-08-11 MED ORDER — ANTICOAGULANT SODIUM CITRATE 4% (200MG/5ML) IV SOLN: 5.0000 mL | Status: DC | PRN

## 2023-08-11 MED ORDER — MELATONIN 3 MG PO TABS
3.0000 mg | ORAL_TABLET | Freq: Every evening | ORAL | Status: DC | PRN
  Administered 2023-08-12 – 2023-08-13 (×2): 3 mg via ORAL
  Filled 2023-08-11 (×2): qty 1

## 2023-08-11 MED ORDER — GABAPENTIN 100 MG PO CAPS
100.0000 mg | ORAL_CAPSULE | Freq: Two times a day (BID) | ORAL | Status: DC
  Administered 2023-08-11 – 2023-08-14 (×7): 100 mg via ORAL
  Filled 2023-08-11 (×7): qty 1

## 2023-08-11 MED ORDER — MAGNESIUM SULFATE 2 GM/50ML IV SOLN
2.0000 g | Freq: Once | INTRAVENOUS | Status: AC
  Administered 2023-08-11: 2 g via INTRAVENOUS
  Filled 2023-08-11: qty 50

## 2023-08-11 MED ORDER — PANTOPRAZOLE SODIUM 40 MG PO TBEC
40.0000 mg | DELAYED_RELEASE_TABLET | Freq: Every day | ORAL | Status: DC
  Administered 2023-08-11 – 2023-08-14 (×4): 40 mg via ORAL
  Filled 2023-08-11 (×4): qty 1

## 2023-08-11 MED ORDER — LORATADINE 10 MG PO TABS
10.0000 mg | ORAL_TABLET | Freq: Every day | ORAL | Status: DC
Start: 2023-08-11 — End: 2023-08-14
  Administered 2023-08-11 – 2023-08-14 (×4): 10 mg via ORAL
  Filled 2023-08-11 (×4): qty 1

## 2023-08-11 MED ORDER — POTASSIUM CHLORIDE CRYS ER 20 MEQ PO TBCR
40.0000 meq | EXTENDED_RELEASE_TABLET | Freq: Once | ORAL | Status: AC
  Administered 2023-08-11: 40 meq via ORAL
  Filled 2023-08-11: qty 2

## 2023-08-11 MED ORDER — ACETAMINOPHEN 325 MG PO TABS: 650.0000 mg | ORAL_TABLET | Freq: Four times a day (QID) | ORAL | Status: DC | PRN

## 2023-08-11 MED ORDER — DEXTROSE 50 % IV SOLN
1.0000 | INTRAVENOUS | Status: DC | PRN
  Administered 2023-08-11 (×5): 50 mL via INTRAVENOUS
  Filled 2023-08-11 (×5): qty 50

## 2023-08-11 MED ORDER — HYDRALAZINE HCL 50 MG PO TABS
100.0000 mg | ORAL_TABLET | Freq: Three times a day (TID) | ORAL | Status: DC
  Administered 2023-08-11 (×2): 100 mg via ORAL
  Filled 2023-08-11 (×2): qty 2

## 2023-08-11 MED ORDER — LIDOCAINE-PRILOCAINE 2.5-2.5 % EX CREA: 1.0000 | TOPICAL_CREAM | CUTANEOUS | Status: DC | PRN

## 2023-08-11 MED ORDER — NEPRO/CARBSTEADY PO LIQD: 237.0000 mL | ORAL | Status: DC | PRN

## 2023-08-11 MED ORDER — DEXTROSE 20 % IV SOLN
INTRAVENOUS | Status: DC
Start: 2023-08-11 — End: 2023-08-11
  Filled 2023-08-11: qty 500

## 2023-08-11 MED ORDER — HYDRALAZINE HCL 20 MG/ML IJ SOLN
5.0000 mg | INTRAMUSCULAR | Status: DC | PRN
  Administered 2023-08-11: 5 mg via INTRAVENOUS
  Filled 2023-08-11: qty 1

## 2023-08-11 MED ORDER — DEXTROSE 20 % IV SOLN
INTRAVENOUS | Status: DC
  Filled 2023-08-11 (×8): qty 500

## 2023-08-11 MED ORDER — ALTEPLASE 2 MG IJ SOLR: 2.0000 mg | Freq: Once | INTRAMUSCULAR | Status: DC | PRN

## 2023-08-11 MED ORDER — HEPARIN SODIUM (PORCINE) 1000 UNIT/ML DIALYSIS
1000.0000 [IU] | INTRAMUSCULAR | Status: AC | PRN
  Administered 2023-08-11: 1000 [IU] via INTRAVENOUS_CENTRAL
  Filled 2023-08-11: qty 1

## 2023-08-11 MED ORDER — OXYCODONE-ACETAMINOPHEN 5-325 MG PO TABS
2.0000 | ORAL_TABLET | Freq: Three times a day (TID) | ORAL | Status: DC | PRN
  Administered 2023-08-11 – 2023-08-14 (×6): 2 via ORAL
  Filled 2023-08-11 (×6): qty 2

## 2023-08-11 MED ORDER — ALBUTEROL SULFATE (2.5 MG/3ML) 0.083% IN NEBU: 2.5000 mg | INHALATION_SOLUTION | RESPIRATORY_TRACT | Status: DC | PRN

## 2023-08-11 MED ORDER — ACETAMINOPHEN 650 MG RE SUPP: 650.0000 mg | Freq: Four times a day (QID) | RECTAL | Status: DC | PRN

## 2023-08-11 MED ORDER — LABETALOL HCL 5 MG/ML IV SOLN
10.0000 mg | INTRAVENOUS | Status: DC | PRN
  Filled 2023-08-11: qty 4

## 2023-08-11 MED ORDER — HYDRALAZINE HCL 20 MG/ML IJ SOLN
10.0000 mg | Freq: Once | INTRAMUSCULAR | Status: AC
  Administered 2023-08-11: 10 mg via INTRAVENOUS
  Filled 2023-08-11: qty 1

## 2023-08-11 MED ORDER — HEPARIN SODIUM (PORCINE) 1000 UNIT/ML DIALYSIS: 20.0000 [IU]/kg | INTRAMUSCULAR | Status: DC | PRN

## 2023-08-11 MED ORDER — HEPARIN SODIUM (PORCINE) 1000 UNIT/ML DIALYSIS: 1000.0000 [IU] | INTRAMUSCULAR | Status: DC | PRN

## 2023-08-11 MED ORDER — POTASSIUM & SODIUM PHOSPHATES 280-160-250 MG PO PACK
1.0000 | PACK | Freq: Three times a day (TID) | ORAL | Status: AC
  Administered 2023-08-11 – 2023-08-12 (×4): 1 via ORAL
  Filled 2023-08-11 (×6): qty 1

## 2023-08-11 MED ORDER — HEPARIN SODIUM (PORCINE) 1000 UNIT/ML DIALYSIS
2000.0000 [IU] | Freq: Once | INTRAMUSCULAR | Status: AC
  Administered 2023-08-11: 2000 [IU] via INTRAVENOUS_CENTRAL
  Filled 2023-08-11: qty 2

## 2023-08-11 NOTE — Progress Notes (Signed)
Received patient in bed.Awake,alert and oriented x 4. She signed her HD treatment's consent.  Access used: Right HD catheter that worked well.Dressing change done today.  Duration of treatment :3.5 hours.  Net UF : 3 L  Tolerated treatment : Yes.  Medicines given : Heparin 2000 and 1,000 units pre and mid run doses.                              Dextrose 50% =50 cc                              Hydralazine 5 mg.   Pre treatment blood sugar -61.   Near the end treatment blood sugar ,81 and 111 post 1 cup of cranberry juice. Hand off to the patient's nurse.Back into her room with stable medical condition via transporter.

## 2023-08-11 NOTE — Progress Notes (Signed)
Report given to Rudean Haskell RN.

## 2023-08-11 NOTE — Progress Notes (Signed)
At 0729 bs 46, 1 amp dex 50% given> as of 0808 bs 79.   MD aware, RN will continue to monitor.

## 2023-08-11 NOTE — ED Notes (Signed)
Admitting MD aware of current blood sugars orders received.

## 2023-08-11 NOTE — Progress Notes (Signed)
Progress Note   Patient: Carol [name] ZOX:[id number] DOB: April 16, 1961 DOA: 08/10/2023     0 DOS: the patient was seen and examined on 08/11/2023   Brief hospital course: 62yo with h/o ESRD on MWF HD, DM, HTN, and COPD who presented on 12/21 with symptomatic hypoglycemia.  Patient was confused/somnolent at home and glucose was in the 40s.  Her only home medication is Tradjenta and her A1c was 4.7 on 12/15.  BP markedly elevated.  Assessment and Plan:  Symptomatic hypoglycemia Patient with known h/o DM on Tradjenta monotherapy Recent A1c was 4.7, not c/w DM Persistent hypoglycemia at home  Patient reports low literacy and medical literacy  She also thinks she may have been popping extra Tradjenta "sugar pills" when her blood sugar was low at home Started on D10 drip with ongoing hypoglycemia despite eating -> D20 drip Stop Tradjenta There does not appear to an antidote and Tradjenta is not reported to be removed through dialysis Given her need for ongoing IVF, she is also at risk for volume overload  Acute metabolic encephalopathy Likely associated with prolonged/recurrent hypoglycemia Anticipate spontaneous improvement with correction of hypoglycemia She was not confused or apparently altered at the time of my evaluation Will resume gabapentin  Uncontrolled HTN Chronically poorly controlled She was recently prescribed Imdur but she has not yet started this medication She also missed her last 2 doses of hydralazine due to AMS Continue amlodipine Change hydralazine to Bidil for ease in dosing Continue ASA  HLD Continue atorvastatin  Abnormal CXR CXR showed multifocal airspace abnormalities on presentation No apparent symptoms Procalcitonin 0.12 No treatment for now  Electrolyte abnormalities Hypokalemia, hypomagnesemia noted on admission Replete, follow  ESRD Patient on chronic MTThF HD (4x/week) Nephrology prn order set utilized She does not appear to be  volume overloaded or otherwise in need of acute HD - but she is receiving large volume IVF for a dialysis patient and so is at risk for volume overload Dr. Arlean Hopping will consult Associated anemia is stable Continue Calcitriol Also on Aranesp Both demadex and furosemide appear to have been recently prescribed - will hold for now and reconsider if needed  QTC prolongation 526 on presentation, 520 this AM - this appears to be a chronic and stable issue for her Will attempt to avoid QT-prolonging medications such as PPI, nausea meds, SSRIs Hold mirtazepine  COPD Continue Dulera, Albuterol, Zyrtec (Claritin per formulary)  GERD Continue pantoprazole  Chronic pain I have reviewed this patient in the [place] Controlled Substances Reporting System. She is receiving medications from two providers but appears to be taking them as prescribed. She IS at very high risk of opioid misuse, diversion, or overdose.  Would use caution both inpatient and outpatient regarding this issue Also on Lunesta at bedtime (change to Ambien while here, prn)  Medication misadventure She acknowledges low overall literacy (6th grade education, does not read well and also has chronic visual impairment) Also with low medical literacy She would benefit from receiving her medications in blister packs for ease in dosing - I have reached out to our [facility] team to see if this is possible or whether she needs to obtain medications elsewhere (she goes to [facility] - closed today so I was unable to ask them if they offer this service) Would attempt to simply medication regimen as much as possible     Subjective: Pleasant, able to explain what happened in broad strokes but she clearly has baseline confusion about her medication regimen  Objective: Vitals:   08/11/23 0730 08/11/23 0742  BP: (!) 186/69   Pulse: 96   Resp: (!) 21   Temp:  98 F (36.7 C)  SpO2: 99%    No intake or output data in the 24 hours ending  08/11/23 0807 There were no vitals filed for this visit.  Exam:  General:  Appears calm and comfortable and is in NAD Eyes:  EOMI, normal lids, iris ENT:  grossly normal hearing, lips & tongue, mmm Neck:  no LAD, masses or thyromegaly Cardiovascular:  RRR, no m/r/g. No LE edema.  Respiratory:   CTA bilaterally with no wheezes/rales/rhonchi.  Normal respiratory effort. Abdomen:  soft, NT, ND Skin:  no rash or induration seen on limited exam Musculoskeletal:  grossly normal tone BUE/BLE, good ROM, no bony abnormality Psychiatric:  grossly normal mood and affect, speech fluent and appropriate, AOx3 Neurologic:  CN 2-12 grossly intact, moves all extremities in coordinated fashion  Data Reviewed: I have reviewed the patient's lab results since admission.  Pertinent labs for today include:  Glucose 20, 27, 81, 46, 79, 73, 45, 57 (despite eating AND D10 drip) K+ 3.3 BUN 15/Creatinine 3.66/GFR 14 Mag++ 1.6 Albumin 1.7 Hgb 8.1    Family Communication: Fiance was present during evaluation  Disposition: Status is: Inpatient Remains inpatient appropriate because: ongoing evaluation and management  Planned Discharge Destination: Home    Total critical care time: 50 minutes Critical care time was exclusive of separately billable procedures and treating other patients. Critical care was necessary to treat or prevent imminent or life-threatening deterioration. Critical care was time spent personally by me on the following activities: development of treatment plan with patient and/or surrogate as well as nursing, discussions with consultants, evaluation of patient's response to treatment, examination of patient, obtaining history from patient or surrogate, ordering and performing treatments and interventions, ordering and review of laboratory studies, ordering and review of radiographic studies, pulse oximetry and re-evaluation of patient's condition.   Author: Jonah Blue,  MD 08/11/2023 7:57 AM  For on call review [email or web address].

## 2023-08-11 NOTE — Procedures (Signed)
I have reviewed the HD regimen and made appropriate changes.  Vinson Moselle MD  [facility] 08/11/2023, 10:12 PM

## 2023-08-11 NOTE — ED Notes (Signed)
ED TO INPATIENT HANDOFF REPORT  ED Nurse Name and Phone #: [phone]   S Name/Age/Gender Carol [name] 62 y.o. female Room/Bed: 008C/008C  Code Status   Code Status: Full Code  Home/SNF/Other Home Patient oriented to: self, place, and time Is this baseline? Yes   Triage Complete: Triage complete  Chief Complaint Hypoglycemia [E16.2]  Triage Note Pt arrived from home via [facility] c/o hypoglycemia EMS lowest 54 current 68. Pt ate 1/2 peanut butter sandwich for ems prior to being transferred.    Allergies Allergies  Allergen Reactions   Penicillins Shortness Of Breath and Other (See Comments)    Caused yeast infection Has patient had a PCN reaction causing immediate rash, facial/tongue/throat swelling, SOB or lightheadedness with hypotension: Yes Has patient had a PCN reaction causing severe rash involving mucus membranes or skin necrosis: No Has patient had a PCN reaction that required hospitalization pt was in the hospital at the time of the reaction Has patient had a PCN reaction occurring within the last 10 years: No If all of the above answers are "NO", then may proceed with Cephalosp   Ultram [Tramadol] Other (See Comments)    Made her tongue raw    Level of Care/Admitting Diagnosis ED Disposition     ED Disposition  Admit   Condition  --   Comment  Hospital Area: [facility] [[id number]]  Level of Care: Progressive [102]  Admit to Progressive based on following criteria: MULTISYSTEM THREATS such as stable sepsis, metabolic/electrolyte imbalance with or without encephalopathy that is responding to early treatment.  May admit patient to [facility] or [facility] if equivalent level of care is available:: No  Covid Evaluation: Asymptomatic - no recent exposure (last 10 days) testing not required  Diagnosis: Hypoglycemia [242204]  Admitting Physician: Angie Fava [[id number]]  Attending Physician: Angie Fava [[id number]]  Certification::  I certify this patient will need inpatient services for at least 2 midnights  Expected Medical Readiness: 08/13/2023          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   Chronic back pain    Depression    Diabetes mellitus    ESRD (end stage renal disease) on dialysis [facility])    M,W,F   GERD (gastroesophageal reflux disease)    Heart murmur    Hypertension    NSTEMI (non-ST elevated myocardial infarction) (HCC) 12/20/2021   Palpitations    Parkinson's disease (HCC)    Past Surgical History:  Procedure Laterality Date   AV FISTULA PLACEMENT Left 08/01/2023   Procedure: LEFT ARM BRACHIOBASILIC FISTULA CREATION;  Surgeon: Daria Pastures, MD;  Location: [facility] OR;  Service: Vascular;  Laterality: Left;   BALLOON DILATION N/A 11/06/2022   Procedure: BALLOON DILATION;  Surgeon: Napoleon Form, MD;  Location: [facility] ENDOSCOPY;  Service: Gastroenterology;  Laterality: N/A;   CARPAL TUNNEL RELEASE     COLONOSCOPY WITH PROPOFOL N/A 11/06/2022   Procedure: COLONOSCOPY WITH PROPOFOL;  Surgeon: Napoleon Form, MD;  Location: [facility] ENDOSCOPY;  Service: Gastroenterology;  Laterality: N/A;   ESOPHAGOGASTRODUODENOSCOPY (EGD) WITH PROPOFOL N/A 11/06/2022   Procedure: ESOPHAGOGASTRODUODENOSCOPY (EGD) WITH PROPOFOL;  Surgeon: Napoleon Form, MD;  Location: [facility] ENDOSCOPY;  Service: Gastroenterology;  Laterality: N/A;   INSERTION OF DIALYSIS CATHETER Right 08/01/2023  Procedure: INSERTION OF TUNNELED DIALYSIS CATHETER;  Surgeon: Daria Pastures, MD;  Location: [facility] OR;  Service: Vascular;  Laterality: Right;   IR FLUORO GUIDE CV LINE RIGHT  01/26/2022   IR US GUIDE VASC ACCESS RIGHT  01/26/2022   LEFT HEART CATH AND CORONARY ANGIOGRAPHY N/A 12/20/2021   Procedure: LEFT HEART CATH AND CORONARY  ANGIOGRAPHY;  Surgeon: Kathleene Hazel, MD;  Location: [facility] INVASIVE CV LAB;  Service: Cardiovascular;  Laterality: N/A;   OPEN REDUCTION INTERNAL FIXATION (ORIF) FINGER WITH RADIAL BONE GRAFT Right 09/12/2015   Procedure: RIGHT MIDDLE FINGER CLOSED REDUCTION AND PINNING;  Surgeon: Bradly Bienenstock, MD;  Location: [facility] OR;  Service: Orthopedics;  Laterality: Right;   TONSILLECTOMY     TUBAL LIGATION       A IV Location/Drains/Wounds Patient Lines/Drains/Airways Status     Active Line/Drains/Airways     Name Placement date Placement time Site Days   Peripheral IV 08/11/23 20 G Right Antecubital 08/11/23  0005  Antecubital  less than 1   Peripheral IV 08/11/23 22 G 1" Right;Posterior Forearm 08/11/23  0544  Forearm  less than 1   Fistula / Graft Left Upper arm Arteriovenous fistula 08/01/23  1101  Upper arm  10   Hemodialysis Catheter Right Internal jugular Double lumen Permanent (Tunneled) 08/01/23  1030  Internal jugular  10            Intake/Output Last 24 hours No intake or output data in the 24 hours ending 08/11/23 1359  Labs/Imaging Results for orders placed or performed during the hospital encounter of 08/10/23 (from the past 48 hours)  CBG monitoring, ED     Status: Abnormal   Collection Time: 08/10/23 11:49 PM  Result Value Ref Range   Glucose-Capillary 68 (L) 70 - 99 mg/dL    Comment: Glucose reference range applies only to samples taken after fasting for at least 8 hours.  CBC with Differential/Platelet     Status: Abnormal   Collection Time: 08/11/23 12:03 AM  Result Value Ref Range   WBC 7.8 4.0 - 10.5 K/uL   RBC 3.30 (L) 3.87 - 5.11 MIL/uL   Hemoglobin 9.4 (L) 12.0 - 15.0 g/dL   HCT 16.6 (L) 06.3 - 01.6 %   MCV 91.5 80.0 - 100.0 fL   MCH 28.5 26.0 - 34.0 pg   MCHC 31.1 30.0 - 36.0 g/dL   RDW 01.0 93.2 - 35.5 %   Platelets 247 150 - 400 K/uL   nRBC 0.3 (H) 0.0 - 0.2 %   Neutrophils Relative % 82 %   Neutro Abs 6.4 1.7 - 7.7 K/uL   Lymphocytes Relative 10 %    Lymphs Abs 0.7 0.7 - 4.0 K/uL   Monocytes Relative 6 %   Monocytes Absolute 0.5 0.1 - 1.0 K/uL   Eosinophils Relative 0 %   Eosinophils Absolute 0.0 0.0 - 0.5 K/uL   Basophils Relative 0 %   Basophils Absolute 0.0 0.0 - 0.1 K/uL   Immature Granulocytes 2 %   Abs Immature Granulocytes 0.16 (H) 0.00 - 0.07 K/uL    Comment: Performed at [facility], [address] [place], [place] [address]  Comprehensive metabolic panel     Status: Abnormal   Collection Time: 08/11/23 12:03 AM  Result Value Ref Range   Sodium 135 135 - 145 mmol/L   Potassium 3.4 (L) 3.5 - 5.1 mmol/L   Chloride 100 98 - 111 mmol/L   CO2 26 22 - 32 mmol/L  Glucose, Bld 74 70 - 99 mg/dL    Comment: Glucose reference range applies only to samples taken after fasting for at least 8 hours.   BUN 13 6 - 20 mg/dL   Creatinine, Ser 4.09 (H) 0.44 - 1.00 mg/dL   Calcium 8.3 (L) 8.9 - 10.3 mg/dL   Total Protein 6.5 6.5 - 8.1 g/dL   Albumin 2.0 (L) 3.5 - 5.0 g/dL   AST 29 15 - 41 U/L   ALT 10 0 - 44 U/L   Alkaline Phosphatase 117 38 - 126 U/L   Total Bilirubin 0.4 <1.2 mg/dL   GFR, Estimated 14 (L) >60 mL/min    Comment: (NOTE) Calculated using the CKD-EPI Creatinine Equation (2021)    Anion gap 9 5 - 15    Comment: Performed at [facility], [address] [place], [place] [address]  Magnesium     Status: Abnormal   Collection Time: 08/11/23 12:03 AM  Result Value Ref Range   Magnesium 1.6 (L) 1.7 - 2.4 mg/dL    Comment: Performed at [facility], [address], [place], [place] [address]  Troponin I (High Sensitivity)     Status: Abnormal   Collection Time: 08/11/23 12:03 AM  Result Value Ref Range   Troponin I (High Sensitivity) 23 (H) <18 ng/L    Comment: (NOTE) Elevated high sensitivity troponin I (hsTnI) values and significant  changes across serial measurements may suggest ACS but many other  chronic and acute conditions are known to elevate hsTnI results.  Refer to the "Links" section  for chest pain algorithms and additional  guidance. Performed at [facility], [address] [place], [place] [address]   TSH     Status: None   Collection Time: 08/11/23 12:03 AM  Result Value Ref Range   TSH 0.878 0.350 - 4.500 uIU/mL    Comment: Performed by a 3rd Generation assay with a functional sensitivity of <=0.01 uIU/mL. Performed at [facility], [address], [place], [place] [address]   CBG monitoring, ED     Status: Abnormal   Collection Time: 08/11/23 12:35 AM  Result Value Ref Range   Glucose-Capillary 107 (H) 70 - 99 mg/dL    Comment: Glucose reference range applies only to samples taken after fasting for at least 8 hours.  CBG monitoring, ED     Status: Abnormal   Collection Time: 08/11/23  1:48 AM  Result Value Ref Range   Glucose-Capillary 42 (LL) 70 - 99 mg/dL    Comment: Glucose reference range applies only to samples taken after fasting for at least 8 hours.   Comment 1 Notify RN   Procalcitonin     Status: None   Collection Time: 08/11/23  1:57 AM  Result Value Ref Range   Procalcitonin 0.12 ng/mL    Comment:        Interpretation: PCT (Procalcitonin) <= 0.5 ng/mL: Systemic infection (sepsis) is not likely. Local bacterial infection is possible. (NOTE)       Sepsis PCT Algorithm           Lower Respiratory Tract                                      Infection PCT Algorithm    ----------------------------     ----------------------------         PCT < 0.25 ng/mL  PCT < 0.10 ng/mL          Strongly encourage             Strongly discourage   discontinuation of antibiotics    initiation of antibiotics    ----------------------------     -----------------------------       PCT 0.25 - 0.50 ng/mL            PCT 0.10 - 0.25 ng/mL               OR       >80% decrease in PCT            Discourage initiation of                                            antibiotics      Encourage discontinuation           of antibiotics     ----------------------------     -----------------------------         PCT >= 0.50 ng/mL              PCT 0.26 - 0.50 ng/mL               AND        <80% decrease in PCT             Encourage initiation of                                             antibiotics       Encourage continuation           of antibiotics    ----------------------------     -----------------------------        PCT >= 0.50 ng/mL                  PCT > 0.50 ng/mL               AND         increase in PCT                  Strongly encourage                                      initiation of antibiotics    Strongly encourage escalation           of antibiotics                                     -----------------------------                                           PCT <= 0.25 ng/mL                                                 OR                                        >  80% decrease in PCT                                      Discontinue / Do not initiate                                             antibiotics  Performed at [facility], [address] [place], [place] [address]   Phosphorus     Status: Abnormal   Collection Time: 08/11/23  1:57 AM  Result Value Ref Range   Phosphorus 1.6 (L) 2.5 - 4.6 mg/dL    Comment: Performed at [facility], [address], [place], [place] [address]  Troponin I (High Sensitivity)     Status: Abnormal   Collection Time: 08/11/23  1:58 AM  Result Value Ref Range   Troponin I (High Sensitivity) 23 (H) <18 ng/L    Comment: (NOTE) Elevated high sensitivity troponin I (hsTnI) values and significant  changes across serial measurements may suggest ACS but many other  chronic and acute conditions are known to elevate hsTnI results.  Refer to the "Links" section for chest pain algorithms and additional  guidance. Performed at [facility], [address], [place], [place] [address]   Brain natriuretic peptide     Status: Abnormal   Collection Time:  08/11/23  1:58 AM  Result Value Ref Range   B Natriuretic Peptide 1,479.1 (H) 0.0 - 100.0 pg/mL    Comment: Performed at [facility], [address] [place], [place] [address]  Urinalysis, w/ Reflex to Culture (Infection Suspected) -Urine, Clean Catch     Status: Abnormal   Collection Time: 08/11/23  2:30 AM  Result Value Ref Range   Specimen Source URINE, CLEAN CATCH    Color, Urine STRAW (A) YELLOW   APPearance CLEAR CLEAR   Specific Gravity, Urine 1.004 (L) 1.005 - 1.030   pH 8.0 5.0 - 8.0   Glucose, UA 50 (A) NEGATIVE mg/dL   Hgb urine dipstick NEGATIVE NEGATIVE   Bilirubin Urine NEGATIVE NEGATIVE   Ketones, ur NEGATIVE NEGATIVE mg/dL   Protein, ur >=010 (A) NEGATIVE mg/dL   Nitrite NEGATIVE NEGATIVE   Leukocytes,Ua NEGATIVE NEGATIVE   RBC / HPF 0-5 0 - 5 RBC/hpf   WBC, UA 0-5 0 - 5 WBC/hpf    Comment:        Reflex urine culture not performed if WBC <=10, OR if Squamous epithelial cells >5. If Squamous epithelial cells >5 suggest recollection.    Bacteria, UA RARE (A) NONE SEEN   Squamous Epithelial / HPF 0-5 0 - 5 /HPF    Comment: Performed at [facility], [address], [place], [place] [address]  CBG monitoring, ED     Status: Abnormal   Collection Time: 08/11/23  2:41 AM  Result Value Ref Range   Glucose-Capillary 43 (LL) 70 - 99 mg/dL    Comment: Glucose reference range applies only to samples taken after fasting for at least 8 hours.   Comment 1 Notify RN   CBG monitoring, ED     Status: Abnormal   Collection Time: 08/11/23  3:34 AM  Result Value Ref Range   Glucose-Capillary 112 (H) 70 - 99 mg/dL    Comment: Glucose reference  range applies only to samples taken after fasting for at least 8 hours.  CBC with Differential/Platelet     Status: Abnormal   Collection Time: 08/11/23  4:19 AM  Result Value Ref Range   WBC 7.3 4.0 - 10.5 K/uL   RBC 2.87 (L) 3.87 - 5.11 MIL/uL   Hemoglobin 8.1 (L) 12.0 - 15.0 g/dL   HCT 40.9 (L) 81.1 - 91.4 %   MCV  90.6 80.0 - 100.0 fL   MCH 28.2 26.0 - 34.0 pg   MCHC 31.2 30.0 - 36.0 g/dL   RDW 78.2 95.6 - 21.3 %   Platelets 214 150 - 400 K/uL   nRBC 0.4 (H) 0.0 - 0.2 %   Neutrophils Relative % 71 %   Neutro Abs 5.1 1.7 - 7.7 K/uL   Lymphocytes Relative 14 %   Lymphs Abs 1.0 0.7 - 4.0 K/uL   Monocytes Relative 12 %   Monocytes Absolute 0.9 0.1 - 1.0 K/uL   Eosinophils Relative 1 %   Eosinophils Absolute 0.1 0.0 - 0.5 K/uL   Basophils Relative 0 %   Basophils Absolute 0.0 0.0 - 0.1 K/uL   Immature Granulocytes 2 %   Abs Immature Granulocytes 0.11 (H) 0.00 - 0.07 K/uL    Comment: Performed at [facility], [address] [place], [place] [address]  Comprehensive metabolic panel     Status: Abnormal   Collection Time: 08/11/23  4:19 AM  Result Value Ref Range   Sodium 134 (L) 135 - 145 mmol/L   Potassium 3.3 (L) 3.5 - 5.1 mmol/L   Chloride 105 98 - 111 mmol/L   CO2 24 22 - 32 mmol/L   Glucose, Bld 110 (H) 70 - 99 mg/dL    Comment: Glucose reference range applies only to samples taken after fasting for at least 8 hours.   BUN 15 6 - 20 mg/dL   Creatinine, Ser 8.46 (H) 0.44 - 1.00 mg/dL   Calcium 7.7 (L) 8.9 - 10.3 mg/dL   Total Protein 5.3 (L) 6.5 - 8.1 g/dL   Albumin 1.7 (L) 3.5 - 5.0 g/dL   AST 26 15 - 41 U/L   ALT 11 0 - 44 U/L   Alkaline Phosphatase 107 38 - 126 U/L   Total Bilirubin 0.3 <1.2 mg/dL   GFR, Estimated 14 (L) >60 mL/min    Comment: (NOTE) Calculated using the CKD-EPI Creatinine Equation (2021)    Anion gap 5 5 - 15    Comment: Performed at [facility], [address], [place], [place] [address]  Magnesium     Status: Abnormal   Collection Time: 08/11/23  4:19 AM  Result Value Ref Range   Magnesium 1.6 (L) 1.7 - 2.4 mg/dL    Comment: Performed at [facility], [address] [place], [place] [address]  Blood gas, venous     Status: Abnormal   Collection Time: 08/11/23  5:39 AM  Result Value Ref Range   pH, Ven 7.55 (H) 7.25 - 7.43   pCO2, Ven  33 (L) 44 - 60 mmHg   pO2, Ven 132 (H) 32 - 45 mmHg   Bicarbonate 28.9 (H) 20.0 - 28.0 mmol/L   Acid-Base Excess 6.6 (H) 0.0 - 2.0 mmol/L   O2 Saturation 99.9 %   Patient temperature 36.8    Collection site RIGHT HAND    Drawn by Carlean Purl     Comment: Performed at [facility], [address]  [place], [place] [address]  Protime-INR     Status: Abnormal   Collection Time: 08/11/23  5:44 AM  Result Value Ref Range   Prothrombin Time >90.0 (H) 11.4 - 15.2 seconds   INR >10.0 (HH) 0.8 - 1.2    Comment: REPEATED TO VERIFY CRITICAL RESULT CALLED TO, READ BACK BY AND VERIFIED WITH: Lew Dawes, RN @0636  08/11/23 SATRAINR (NOTE) INR goal varies based on device and disease states. Performed at [facility], [address] [place], [place] [address]   Vitamin B12     Status: None   Collection Time: 08/11/23  5:45 AM  Result Value Ref Range   Vitamin B-12 424 180 - 914 pg/mL    Comment: (NOTE) This assay is not validated for testing neonatal or myeloproliferative syndrome specimens for Vitamin B12 levels. Performed at [facility], [address], [place], [place] [address]   CBG monitoring, ED     Status: Abnormal   Collection Time: 08/11/23  6:01 AM  Result Value Ref Range   Glucose-Capillary 20 (LL) 70 - 99 mg/dL    Comment: Glucose reference range applies only to samples taken after fasting for at least 8 hours.   Comment 1 Repeat Test   CBG monitoring, ED     Status: Abnormal   Collection Time: 08/11/23  6:03 AM  Result Value Ref Range   Glucose-Capillary 27 (LL) 70 - 99 mg/dL    Comment: Glucose reference range applies only to samples taken after fasting for at least 8 hours.  CBG monitoring, ED     Status: None   Collection Time: 08/11/23  6:38 AM  Result Value Ref Range   Glucose-Capillary 81 70 - 99 mg/dL    Comment: Glucose reference range applies only to samples taken after fasting for at least 8 hours.  CBG monitoring, ED     Status: Abnormal    Collection Time: 08/11/23  7:29 AM  Result Value Ref Range   Glucose-Capillary 46 (L) 70 - 99 mg/dL    Comment: Glucose reference range applies only to samples taken after fasting for at least 8 hours.  CBG monitoring, ED     Status: None   Collection Time: 08/11/23  8:08 AM  Result Value Ref Range   Glucose-Capillary 79 70 - 99 mg/dL    Comment: Glucose reference range applies only to samples taken after fasting for at least 8 hours.  CBG monitoring, ED     Status: None   Collection Time: 08/11/23  9:17 AM  Result Value Ref Range   Glucose-Capillary 73 70 - 99 mg/dL    Comment: Glucose reference range applies only to samples taken after fasting for at least 8 hours.  Protime-INR     Status: None   Collection Time: 08/11/23  9:30 AM  Result Value Ref Range   Prothrombin Time 12.8 11.4 - 15.2 seconds   INR 1.0 0.8 - 1.2    Comment: (NOTE) INR goal varies based on device and disease states. Performed at [facility], [address] [place], [place] [address]   CBG monitoring, ED     Status: Abnormal   Collection Time: 08/11/23 10:31 AM  Result Value Ref Range   Glucose-Capillary 45 (L) 70 - 99 mg/dL    Comment: Glucose reference range applies only to samples taken after fasting for at least 8 hours.  CBG monitoring, ED     Status: Abnormal   Collection Time: 08/11/23 12:12 PM  Result  Value Ref Range   Glucose-Capillary 57 (L) 70 - 99 mg/dL    Comment: Glucose reference range applies only to samples taken after fasting for at least 8 hours.  CBG monitoring, ED     Status: Abnormal   Collection Time: 08/11/23  1:20 PM  Result Value Ref Range   Glucose-Capillary 39 (LL) 70 - 99 mg/dL    Comment: Glucose reference range applies only to samples taken after fasting for at least 8 hours.   CT HEAD WO CONTRAST ( ) Result Date: 08/11/2023 CLINICAL DATA:  Mental status change, unknown cause.  Hypoglycemia. EXAM: CT HEAD WITHOUT CONTRAST TECHNIQUE: Contiguous axial images  were obtained from the base of the skull through the vertex without intravenous contrast. RADIATION DOSE REDUCTION: This exam was performed according to the departmental dose-optimization program which includes automated exposure control, adjustment of the mA and/or kV according to patient size and/or use of iterative reconstruction technique. 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 No acute intracranial abnormality. Specifically, no hemorrhage, hydrocephalus, mass lesion, acute infarction, or significant intracranial injury. Vascular: No hyperdense vessel or unexpected calcification. Skull: No acute calvarial abnormality. Sinuses/Orbits: No acute findings Other: None IMPRESSION: No acute intracranial abnormality. Electronically Signed   By: Charlett Nose M.D.   On: 08/11/2023 01:06   DG Chest Port 1 View Result Date: 08/11/2023 CLINICAL DATA:  Weakness. EXAM: PORTABLE CHEST 1 VIEW COMPARISON:  August 04, 2023 FINDINGS: There is stable right-sided venous catheter positioning. The cardiac silhouette is mildly enlarged and unchanged in size. Mild, diffuse, chronic appearing increased interstitial lung markings are seen. Mild superimposed atelectasis and/or infiltrate is seen within the mid left lung and bilateral lung bases. No pleural effusion or pneumothorax is identified. The visualized skeletal structures are unremarkable. IMPRESSION: Chronic appearing increased interstitial lung markings with mild superimposed mid left lung and bibasilar atelectasis and/or infiltrate. Electronically Signed   By: Aram Candela M.D.   On: 08/11/2023 00:06    Pending Labs Unresulted Labs (From admission, onward)     Start     Ordered   08/12/23 0500  Renal function panel  Daily,   R      08/11/23 1332   08/11/23 0422  Rapid urine drug screen (hospital performed)  Add-on,   AD        08/11/23 0421             Vitals/Pain Today's Vitals   08/11/23 0900 08/11/23 1030 08/11/23 1045 08/11/23 1051  BP: (!) 192/71 (!) 190/68 (!) 188/71   Pulse: 99 94 95   Resp: 17 19 19    Temp:    98.3 F (36.8 C)  TempSrc:    Oral  SpO2: 100% 99% 100%   PainSc:    7     Isolation Precautions No active isolations  Medications Medications  acetaminophen (TYLENOL) tablet 650 mg (has no administration in time range)    Or  acetaminophen (TYLENOL) suppository 650 mg (has no administration in time range)  melatonin tablet 3 mg (has no administration in time range)  amLODipine (NORVASC) tablet 10 mg (10 mg Oral Given 08/11/23 0445)  labetalol (NORMODYNE) injection 10 mg (has no administration in time range)  aspirin EC tablet 81 mg (81 mg Oral Given 08/11/23 1052)  atorvastatin (LIPITOR) tablet 80 mg (80 mg Oral Given 08/11/23 1052)  mometasone-formoterol (DULERA) 200-5 MCG/ACT inhaler 2 puff (has no administration in time range)  calcitRIOL (ROCALTROL)  capsule 0.25 mcg (0.25 mcg Oral Given 08/11/23 1053)  oxyCODONE-acetaminophen (PERCOCET/ROXICET) 5-325 MG per tablet 2 tablet (has no administration in time range)  pantoprazole (PROTONIX) EC tablet 40 mg (40 mg Oral Given 08/11/23 1354)  albuterol (PROVENTIL) (2.5 MG/3ML) 0.083% nebulizer solution 2.5 mg (has no administration in time range)  dextrose 50 % solution 50 mL (50 mLs Intravenous Given 08/11/23 1327)  dextrose 20 % infusion ( Intravenous New Bag/Given 08/11/23 1353)  Chlorhexidine Gluconate Cloth 2 % PADS 6 each (has no administration in time range)  loratadine (CLARITIN) tablet 10 mg (10 mg Oral Given 08/11/23 1354)  zolpidem (AMBIEN) tablet 5 mg (has no administration in time range)  gabapentin (NEURONTIN) capsule 100 mg (100 mg Oral Given 08/11/23 1353)  isosorbide-hydrALAZINE (BIDIL) 20-37.5 MG per tablet 2 tablet (has no administration in time range)  hydrALAZINE (APRESOLINE) injection 5 mg (has no administration in time range)   potassium & sodium phosphates (PHOS-NAK) 280-160-250 MG packet 1 packet (has no administration in time range)  dextrose 50 % solution 12.5 g (12.5 g Intravenous Given 08/11/23 0007)  dextrose 50 % solution 50 mL (50 mLs Intravenous Given 08/11/23 0153)  dextrose 50 % solution 50 mL (50 mLs Intravenous Given 08/11/23 0305)  hydrALAZINE (APRESOLINE) injection 10 mg (10 mg Intravenous Given 08/11/23 0408)  oxyCODONE-acetaminophen (PERCOCET/ROXICET) 5-325 MG per tablet 2 tablet (2 tablets Oral Given 08/11/23 0408)  magnesium sulfate IVPB 2 g 50 mL (0 g Intravenous Stopped 08/11/23 0546)  potassium chloride SA (KLOR-CON M) CR tablet 40 mEq (40 mEq Oral Given 08/11/23 0831)    Mobility walks     Focused Assessments Renal Assessment Handoff:  Hemodialysis Schedule: Hemodialysis Schedule: Monday/Wednesday/Friday Last Hemodialysis date and time:    Restricted appendage: left arm   R Recommendations: See Admitting Provider Note  Report given to:   Additional Notes:

## 2023-08-11 NOTE — ED Provider Notes (Signed)
[place] EMERGENCY DEPARTMENT AT [facility] Provider Note   CSN: [id number] Arrival date & time: 08/10/23  2346     History {Add pertinent medical, surgical, social history, OB history to HPI:1} Chief Complaint  Patient presents with   Hypoglycemia    Carol [name] is a 62 y.o. female.  Patient presents to the emergency department by ambulance from home.  Sister called EMS because she was not acting at her normal baseline.  Patient reportedly started to feel very weak, sat down and then was altered.  Blood sugar was low, she tried to eat some peanut butter but did not improve.  EMS was unable to get an IV established.       Home Medications Prior to Admission medications   Medication Sig Start Date End Date Taking? Authorizing Provider  albuterol (PROVENTIL HFA;VENTOLIN HFA) 108 (90 Base) MCG/ACT inhaler Inhale 2 puffs into the lungs every 4 (four) hours as needed for wheezing or shortness of breath.    [provider]  amLODipine (NORVASC) 10 MG tablet Take 1 tablet (10 mg total) by mouth daily. 11/01/22   Jacklynn Ganong, FNP  aspirin EC 81 MG tablet Take 1 tablet (81 mg total) by mouth daily. Swallow whole. 05/11/22   Quincy Simmonds, MD  atorvastatin (LIPITOR) 80 MG tablet Take 1 tablet (80 mg total) by mouth daily. 03/11/23   Azucena Fallen, MD  budesonide-formoterol [facility]) 160-4.5 MCG/ACT inhaler Inhale 2 puffs into the lungs in the morning and at bedtime. 11/09/21   [provider]  calcitRIOL (ROCALTROL) 0.25 MCG capsule Take 0.25 mcg by mouth daily. 07/22/23   [provider]  cetirizine (ZYRTEC) 10 MG tablet Take 10 mg by mouth daily as needed for allergies.    [provider]  Darbepoetin Alfa (ARANESP) 60 MCG/0.3ML SOSY injection Inject 0.3 mLs (60 mcg total) into the skin every Thursday at 6pm. 08/08/23   Dezii, Alexandra, DO  eszopiclone 3 MG TABS Take 3 mg by mouth at bedtime. Take immediately before  bedtime    [provider]  gabapentin (NEURONTIN) 100 MG capsule Take 1 capsule (100 mg total) by mouth 3 (three) times daily. Patient taking differently: Take 100 mg by mouth 2 (two) times daily. 03/29/23 04/28/23  Arrien, York Ram, MD  hydrALAZINE (APRESOLINE) 100 MG tablet 100 mg 3 (three) times daily. 07/29/23   [provider]  linagliptin (TRADJENTA) 5 MG TABS tablet Take 1 tablet (5 mg total) by mouth daily. Patient taking differently: Take 5 mg by mouth at bedtime. 11/08/22   Ghimire, Werner Lean, MD  melatonin 5 MG TABS Take 1 tablet (5 mg total) by mouth at bedtime as needed (sleep). 11/07/22   Ghimire, Werner Lean, MD  mirtazapine (REMERON) 7.5 MG tablet Take 1 tablet (7.5 mg total) by mouth at bedtime. 11/07/22   Ghimire, Werner Lean, MD  naphazoline-pheniramine (NAPHCON-A) 0.025-0.3 % ophthalmic solution Place 2 drops into both eyes 2 (two) times daily.    [provider]  nitroGLYCERIN (NITROSTAT) 0.4 MG SL tablet Place 1 tablet (0.4 mg total) under the tongue every 5 (five) minutes as needed for chest pain. 05/11/22 11/01/23  Quincy Simmonds, MD  oxyCODONE-acetaminophen (PERCOCET) 10-325 MG tablet Take 1 tablet by mouth every 8 (eight) hours as needed for pain. 07/22/23 08/21/23  [provider]  pantoprazole (PROTONIX) 40 MG tablet Take 1 tablet (40 mg total) by mouth daily at 12 noon. Patient taking differently: Take 40 mg by mouth at bedtime.  05/11/22   Quincy Simmonds, MD  triamcinolone ointment (KENALOG) 0.1 % 1 Application 2 (two) times daily as needed. 06/29/23   [provider]      Allergies    Penicillins and Ultram [tramadol]    Review of Systems   Review of Systems  Physical Exam Updated Vital Signs BP (!) 211/105 (BP Location: Right Arm)   Pulse 97   Temp (!) 97.4 F (36.3 C) (Oral)   Resp 15   SpO2 100%  Physical Exam Vitals and nursing note reviewed.  Constitutional:      General: She is not in acute distress.     Appearance: She is well-developed.  HENT:     Head: Normocephalic and atraumatic.     Mouth/Throat:     Mouth: Mucous membranes are moist.  Eyes:     General: Vision grossly intact. Gaze aligned appropriately.     Extraocular Movements: Extraocular movements intact.     Conjunctiva/sclera: Conjunctivae normal.  Cardiovascular:     Rate and Rhythm: Normal rate and regular rhythm.     Pulses: Normal pulses.     Heart sounds: Normal heart sounds, S1 normal and S2 normal. No murmur heard.    No friction rub. No gallop.  Pulmonary:     Effort: Pulmonary effort is normal. No respiratory distress.     Breath sounds: Normal breath sounds.  Abdominal:     General: Bowel sounds are normal.     Palpations: Abdomen is soft.     Tenderness: There is no abdominal tenderness. There is no guarding or rebound.     Hernia: No hernia is present.  Musculoskeletal:        General: No swelling.     Cervical back: Full passive range of motion without pain, normal range of motion and neck supple. No spinous process tenderness or muscular tenderness. Normal range of motion.     Right lower leg: No edema.     Left lower leg: No edema.  Skin:    General: Skin is warm and dry.     Capillary Refill: Capillary refill takes less than 2 seconds.     Findings: No ecchymosis, erythema, rash or wound.  Neurological:     General: No focal deficit present.     Mental Status: She is alert. She is confused.     Cranial Nerves: Cranial nerves 2-12 are intact.     Sensory: Sensation is intact.     Motor: Motor function is intact.     Coordination: Coordination is intact.  Psychiatric:        Attention and Perception: Attention normal.        Mood and Affect: Mood normal.        Speech: Speech normal.        Behavior: Behavior normal.     ED Results / Procedures / Treatments   Labs (all labs ordered are listed, but only abnormal results are displayed) Labs Reviewed  CBG MONITORING, ED - Abnormal; Notable for  the following components:      Result Value   Glucose-Capillary 68 (*)    All other components within normal limits  CBC WITH DIFFERENTIAL/PLATELET  COMPREHENSIVE METABOLIC PANEL  MAGNESIUM  TROPONIN I (HIGH SENSITIVITY)    EKG EKG Interpretation Date/Time:  Saturday August 10 2023 23:53:18 EST Ventricular Rate:  96 PR Interval:  152 QRS Duration:  102 QT Interval:  416 QTC Calculation: 526 R Axis:   52  Text Interpretation: Sinus rhythm Probable left  atrial enlargement Nonspecific T abnormalities, lateral leads Prolonged QT interval Confirmed by Gilda Crease [phone]) on 08/10/2023 11:58:46 PM  Radiology No results found.  Procedures Procedures  {Document cardiac monitor, telemetry assessment procedure when appropriate:1}  Medications Ordered in ED Medications  dextrose 50 % solution 12.5 g (has no administration in time range)    ED Course/ Medical Decision Making/ A&P   {   Click here for ABCD2, HEART and other calculatorsREFRESH Note before signing :1}                              Medical Decision Making Amount and/or Complexity of Data Reviewed Labs: ordered. Radiology: ordered.  Risk Prescription drug management.   ***  {Document critical care time when appropriate:1} {Document review of labs and clinical decision tools ie heart score, Chads2Vasc2 etc:1}  {Document your independent review of radiology images, and any outside records:1} {Document your discussion with family members, caretakers, and with consultants:1} {Document social determinants of health affecting pt's care:1} {Document your decision making why or why not admission, treatments were needed:1} Final Clinical Impression(s) / ED Diagnoses Final diagnoses:  None    Rx / DC Orders ED Discharge Orders     None

## 2023-08-11 NOTE — Consult Note (Addendum)
Renal Service Consult Note [facility]  Carol [name] 08/11/2023 Carol Krabbe, MD Requesting Physician: Dr. Ophelia Charter  Reason for Consult: ESRD pt w/ hypoglycemia  HPI: The patient is a 62 y.o. year-old w/ PMH as below who presented to ED late last night w/ c/o hypoglycemia. Family noticed pt was somnolent and a bit confused earlier yest evening, and her BS was in the 40s. She did eat and improved shortly but then got worse again. EMS was called. She has ESRD on HD. Takes tradjenta as her only DM medication. Last A1c was 4.7% on 12/15. Has not missed HD sessions (MWF). In ED afeb, SBP 200, HR 90s, RR 17024. 100% on RA.  K+ 3.4  Ca 9.9 adj, alb 2.0, LFT's ok. Mg 1.6  wbc 7K Hb 9.4, UA negative. Pt was admitted and is getting IV d10 at 75- 100 cc/hr.  We are asked to see for dialysis.   Pt seen in ED room. Main concern is she is not sure if she ever got the right medications she was supposed to have when she left the hospital recently. She denies any recent HD issues, catheter working okay, no passing out or cramping at HD. Just started D on 12/12 and was dc'd home on 12/16.    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   Chronic back pain    Depression    Diabetes mellitus    ESRD (end stage renal disease) on dialysis [facility])    M,W,F   GERD (gastroesophageal reflux disease)    Heart murmur    Hypertension    NSTEMI (non-ST elevated myocardial infarction) (HCC) 12/20/2021   Palpitations    Parkinson's disease (HCC)    Past Surgical History  Past Surgical History:  Procedure Laterality Date   AV FISTULA PLACEMENT Left  08/01/2023   Procedure: LEFT ARM BRACHIOBASILIC FISTULA CREATION;  Surgeon: Daria Pastures, MD;  Location: [facility] OR;  Service: Vascular;  Laterality: Left;   BALLOON DILATION N/A 11/06/2022   Procedure: BALLOON DILATION;  Surgeon: Napoleon Form, MD;  Location: [facility] ENDOSCOPY;  Service: Gastroenterology;  Laterality: N/A;   CARPAL TUNNEL RELEASE     COLONOSCOPY WITH PROPOFOL N/A 11/06/2022   Procedure: COLONOSCOPY WITH PROPOFOL;  Surgeon: Napoleon Form, MD;  Location: [facility] ENDOSCOPY;  Service: Gastroenterology;  Laterality: N/A;   ESOPHAGOGASTRODUODENOSCOPY (EGD) WITH PROPOFOL N/A 11/06/2022   Procedure: ESOPHAGOGASTRODUODENOSCOPY (EGD) WITH PROPOFOL;  Surgeon: Napoleon Form, MD;  Location: [facility] ENDOSCOPY;  Service: Gastroenterology;  Laterality: N/A;   INSERTION OF DIALYSIS CATHETER Right 08/01/2023   Procedure: INSERTION OF TUNNELED DIALYSIS CATHETER;  Surgeon: Daria Pastures, MD;  Location: [facility] OR;  Service: Vascular;  Laterality: Right;   IR FLUORO GUIDE CV LINE RIGHT  01/26/2022   IR US GUIDE VASC ACCESS RIGHT  01/26/2022   LEFT HEART CATH AND CORONARY ANGIOGRAPHY N/A 12/20/2021   Procedure: LEFT HEART CATH AND CORONARY ANGIOGRAPHY;  Surgeon: Kathleene Hazel, MD;  Location: [facility] INVASIVE CV LAB;  Service: Cardiovascular;  Laterality: N/A;   OPEN REDUCTION INTERNAL FIXATION (ORIF) FINGER WITH RADIAL BONE GRAFT Right 09/12/2015  Procedure: RIGHT MIDDLE FINGER CLOSED REDUCTION AND PINNING;  Surgeon: Bradly Bienenstock, MD;  Location: [facility] OR;  Service: Orthopedics;  Laterality: Right;   TONSILLECTOMY     TUBAL LIGATION     Family History  Family History  Problem Relation Age of Onset   Diabetes Mother    Kidney disease Mother    Seizures Father    Hypertension Father    Stroke Father    Asthma Other    Social History  reports that she has been smoking cigarettes. She has a 0.8 pack-year smoking history. She has been exposed to tobacco smoke. She has never used smokeless tobacco. She  reports current alcohol use. She reports current drug use. Drug: Cocaine. Allergies  Allergies  Allergen Reactions   Penicillins Shortness Of Breath and Other (See Comments)    Caused yeast infection Has patient had a PCN reaction causing immediate rash, facial/tongue/throat swelling, SOB or lightheadedness with hypotension: Yes Has patient had a PCN reaction causing severe rash involving mucus membranes or skin necrosis: No Has patient had a PCN reaction that required hospitalization pt was in the hospital at the time of the reaction Has patient had a PCN reaction occurring within the last 10 years: No If all of the above answers are "NO", then may proceed with Cephalosp   Ultram [Tramadol] Other (See Comments)    Made her tongue raw   Home medications Prior to Admission medications   Medication Sig Start Date End Date Taking? Authorizing Provider  albuterol (PROVENTIL HFA;VENTOLIN HFA) 108 (90 Base) MCG/ACT inhaler Inhale 2 puffs into the lungs every 4 (four) hours as needed for wheezing or shortness of breath.   Yes [provider]  amLODipine (NORVASC) 10 MG tablet Take 1 tablet (10 mg total) by mouth daily. 11/01/22  Yes Milford, Anderson Malta, FNP  aspirin EC 81 MG tablet Take 1 tablet (81 mg total) by mouth daily. Swallow whole. 05/11/22  Yes Quincy Simmonds, MD  atorvastatin (LIPITOR) 80 MG tablet Take 1 tablet (80 mg total) by mouth daily. 03/11/23  Yes Azucena Fallen, MD  budesonide-formoterol [facility]) 160-4.5 MCG/ACT inhaler Inhale 2 puffs into the lungs in the morning and at bedtime. 11/09/21  Yes [provider]  calcitRIOL (ROCALTROL) 0.25 MCG capsule Take 0.25 mcg by mouth daily. 07/22/23  Yes [provider]  cetirizine (ZYRTEC) 10 MG tablet Take 10 mg by mouth daily as needed for allergies.   Yes [provider]  Darbepoetin Alfa (ARANESP) 60 MCG/0.3ML SOSY injection Inject 0.3 mLs (60 mcg total) into the skin every Thursday at 6pm. 08/08/23   Yes Dezii, Alexandra, DO  eszopiclone 3 MG TABS Take 3 mg by mouth at bedtime. Take immediately before bedtime   Yes [provider]  furosemide (LASIX) 40 MG tablet Take 40 mg by mouth daily. 08/09/23  Yes [provider]  gabapentin (NEURONTIN) 100 MG capsule Take 1 capsule (100 mg total) by mouth 3 (three) times daily. Patient taking differently: Take 100 mg by mouth 2 (two) times daily. 03/29/23 08/10/24 Yes Arrien, York Ram, MD  hydrALAZINE (APRESOLINE) 100 MG tablet 100 mg 3 (three) times daily. 07/29/23  Yes [provider]  isosorbide mononitrate (IMDUR) 30 MG 24 hr tablet Take 15 mg by mouth daily. 08/09/23  Yes [provider]  linagliptin (TRADJENTA) 5 MG TABS tablet Take 1 tablet (5 mg total) by mouth daily. Patient taking differently: Take 5 mg by mouth at bedtime. 11/08/22  Yes Ghimire, Werner Lean, MD  mirtazapine (REMERON) 7.5 MG tablet Take 1 tablet (7.5 mg total) by mouth at bedtime. 11/07/22  Yes Ghimire, Werner Lean, MD  naphazoline-pheniramine (NAPHCON-A) 0.025-0.3 % ophthalmic solution Place 2 drops into both eyes 2 (two) times daily.   Yes [provider]  nitroGLYCERIN (NITROSTAT) 0.4 MG SL tablet Place 1 tablet (0.4 mg total) under the tongue every 5 (five) minutes as needed for chest pain. 05/11/22 11/01/23 Yes Quincy Simmonds, MD  oxyCODONE-acetaminophen (PERCOCET) 10-325 MG tablet Take 1 tablet by mouth every 8 (eight) hours as needed for pain. 07/22/23 08/21/23 Yes [provider]  pantoprazole (PROTONIX) 40 MG tablet Take 1 tablet (40 mg total) by mouth daily at 12 noon. Patient taking differently: Take 40 mg by mouth at bedtime. 05/11/22  Yes Quincy Simmonds, MD  potassium chloride (KLOR-CON) 10 MEQ tablet Take 10 mEq by mouth daily. 08/09/23  Yes [provider]  torsemide (DEMADEX) 100 MG tablet Take 100 mg by mouth 2 (two) times daily. 08/09/23  Yes [provider]  triamcinolone ointment (KENALOG) 0.1 %  1 Application 2 (two) times daily as needed. 06/29/23  Yes [provider]  melatonin 5 MG TABS Take 1 tablet (5 mg total) by mouth at bedtime as needed (sleep). Patient not taking: Reported on 08/11/2023 11/07/22   Maretta Bees, MD     Vitals:   08/11/23 0630 08/11/23 0730 08/11/23 0742 08/11/23 0900  BP: (!) 175/74 (!) 186/69  (!) 192/71  Pulse: 100 96  99  Resp: 18 (!) 21  17  Temp:   98 F (36.7 C)   TempSrc:   Oral   SpO2: 99% 99%  100%   Exam Gen alert, no distress No rash, cyanosis or gangrene Sclera anicteric, throat clear  No jvd or bruits Chest clear bilat to bases, no rales/ wheezing RRR no MRG Abd soft ntnd no mass or ascites +bs GU defer MS no joint effusions or deformity Ext 1+ bilat pretib edema, no wounds or ulcers Neuro is alert, Ox 3 , nf    TDC intact in chest       Renal-related home meds: - norvasc 10, hydralazine 100 tid, imdur 15 - lasix 40 daily, demadex 100 bid - gabapentin 100 tid - klorcon 10 qd   OP HD: [facility] 4d/wk (usually MTuThF).  - 3h   400/1.5  3K/2.5Ca bath  78kg  TDC/ 1st BB AVF 08/01/23 (maturing)  Hep 2000 - mircera 100 mcg q 2, start 2/19 - rocaltrol 0.5 mcg q hd   Na 134  K 3.3 BUN 15  creat 3.66    VBG  7.55/ 33/ 132   Hb 8.1  wbc 7K  CXR 12/21 - IMPRESSION: Chronic appearing increased interstitial lung markings with mild superimposed mid left lung and bibasilar atelectasis and/or infiltrate.     Assessment/ Plan: Hypoglycemia - getting D10 at 75 cc/hr. Per pmd. Trandjenta (linagliptin) is not thought to be dialyzable.  ESKD - on HD 4d/wk at the TCU. Will plan HD here today on holiday schedule, and cont MWF holiday schedule while here this week (=sun-tues-Friday).  HTN - BP's high on arrival in 200s, now 170/85. Cont home meds.  Volume - exam shows mild LE edema, clear lungs. UF goal 2.5- 3 L as tol. CXR I think is overpenetrated. On RA.  Anemia of eskd - Hb 8- 9 here. Get OP records.  MBD ckd - CCa in  range, phos is very low at 1.6- pharm recommending oral phos NAK,  will give tid for 2 days. Check RFP daily x 3 while here. Hold any binders.  DM2 COPD      Vinson Moselle  MD [facility] 08/11/2023, 10:39 AM  Recent Labs  Lab 08/05/23 0248 08/11/23 0003 08/11/23 0157 08/11/23 0419  HGB 8.2* 9.4*  --  8.1*  ALBUMIN <1.5* 2.0*  --  1.7*  CALCIUM 7.8* 8.3*  --  7.7*  PHOS 4.1  --  1.6*  --   CREATININE 4.64* 3.55*  --  3.66*  K 3.7 3.4*  --  3.3*   Inpatient medications:  amLODipine  10 mg Oral Daily   aspirin EC  81 mg Oral Daily   atorvastatin  80 mg Oral Daily   calcitRIOL  0.25 mcg Oral Daily   hydrALAZINE  100 mg Oral TID   mometasone-formoterol  2 puff Inhalation BID   pantoprazole  40 mg Oral Q1200    dextrose 100 mL/hr at 08/11/23 0617   acetaminophen **OR** acetaminophen, albuterol, dextrose, labetalol, melatonin, ondansetron (ZOFRAN) IV, oxyCODONE-acetaminophen

## 2023-08-11 NOTE — Care Management (Signed)
Transition of Care [facility]) - Inpatient Brief Assessment   Patient Details  Name: Carol [name] MRN: [id number] Date of Birth: 1961-04-02  [facility] of [facility]) CM/SW Contact:    Lockie Pares, RN Phone Number: 08/11/2023, 9:07 AM   Clinical Narrative: 62 year old patient readmission from last week. Has a history of ESRD, DM. Presented with AMS, hypoglycemia. Consult placed for [facility] for medication assistance. Previous admission CM gave patient a pill box to assist. The patient has insurance therefore is ineligible for [facility] medication assistance program. She is a patient of [facility].    TOC will follow for needs, recommendations, and transitions of care   Transition of Care Asessment: Insurance and Status: Insurance coverage has been reviewed Patient has primary care physician: Yes   Prior level of function:: Independent Prior/Current Home Services: No current home services Social Drivers of Health Review: SDOH reviewed no interventions necessary Readmission risk has been reviewed: Yes Transition of care needs: transition of care needs identified, TOC will continue to follow

## 2023-08-11 NOTE — H&P (Addendum)
History and Physical      Carol [name] KVQ:[id number] DOB: 1961-06-25 DOA: 08/10/2023; DOS: 08/11/2023  PCP: [facility]  Patient coming from: home   I have personally briefly reviewed patient's old medical records in [facility]  Chief Complaint: Hypoglycemia-  HPI: Carol [name] is a 62 y.o. female with medical history significant for end-stage renal disease on hemodialysis on Monday, Wednesday, Friday schedule, type 2 diabetes mellitus, essential hypertension, COPD, anemia of chronic kidney disease associated baseline hemoglobin 8-10, who is admitted to [facility] on 08/10/2023 with hypoglycemia after presenting from home to [facility] ED complaining of hypoglycemia.    Patient's family noted the patient to be slightly confused, somnolent earlier this evening, at which time her CBG was found to be in the 40s.  They are able to get the patient to eat something, which improved, transiently her blood sugar, corresponding to improvement in her mental status.  However, her blood sugar trended back down, prompting EMS to be contacted.  EMS noted initial CBG to be in the 40s, but were unsuccessful and obtaining peripheral IV access initially.  They are able to get the patient to eat something, with ensuing improvement in blood sugar, corresponding with improvement in mental status, following which the patient was brought to [facility] emergency department for further evaluation management thereof.  She has a history of type 2 diabetes mellitus, and is on Tradjenta, but no additional oral hypoglycemic agents as an outpatient, nor any basal short acting insulin.  Most recent hemoglobin A1c level was found to be 4.7% when checked on 08/04/2023.  She also has a history of end-stage renal disease on hemodialysis on Monday, Wednesday, Friday schedule, without any recently missed sessions of such.   Not associate any acute focal weakness or any acute focal numbness or  paresthesias.  The patient denies any recent subjective fever, chills, rigors, or generalized myalgias.  No recent chest pain, shortness of breath, or cough.  Despite her end-stage renal disease, she does continue to produce a small amount of urine.  Denies any recent dysuria.  Her medical history is also notable for essential hypertension, which appears to been poorly controlled as of recent, prompting new prescription for Imdur, which she has not yet started to take.  This is in addition to her outpatient hydralazine and Norvasc.  Patient notes that she missed 2 doses of hydralazine on Saturday, 08/10/2023 in setting of her altered mental status.     ED Course:  Vital signs in the ED were notable for the following: Afebrile; heart's in the 90s to low 100s; systolic blood pressures in the low 200s to 220s; respiratory rate 17-24, oxygen saturation 100% on room air.  Labs were notable for the following: Initial CBG in the ED 107, trended down to 42 prompting half amp of D50, following which CBG improved slightly to 43, prompting amp of D50, following which blood sugar was noted to improved to 112.  Blood sugar began to trend down again, prompting initiation of D10 at 75 cc/h.  CMP notable for the following: Potassium 3.4, bicarbonate 26, glucose 74, calcium adjusted for mild hyperlipidemia noted to be 9.9, albumin 2.0.  Otherwise, liver enzymes within normal limits.  High sensitive troponin I x 2 were both found to be 23, which is relative to most recent prior high sensitive troponin I value of 38 on 08/04/2023.  Magnesium level 1.6.  CBC notable for open cell count 7800, hemoglobin 9.4 associated Neuraceq/Norocarp properties  and nonelevated RDW, relative to most recent prior hemoglobin data point of 8.2 on 08/05/2023.  Urinalysis showed no white blood cells and was leukocyte esterase/nitrate negative.  Per my interpretation, EKG in ED demonstrated the following: Sinus rhythm with heart rate 96,  prolonged QTc of 526, nonspecific T wave inversion in aVL, no evidence of ST changes, including no evidence of ST elevation.  Imaging in the ED, per corresponding formal radiology read, was notable for the following: 1 view chest x-ray showed mid left lung and bibasilar airspace opacities, felt to represent atelectasis versus infiltrate, will demonstrating no evidence of edema, effusion, or pneumothorax.  CT head showed no evidence of acute intracranial process, Cleen evidence of intracranial pressure or any evidence of acute infarct.  While in the ED, the following were administered: Half amp D50 x 1, amp of D50 x 2, Percocet 5/325 mg p.o. x 2 tabs, D10 running at 75 cc/h.  Subsequently, the patient was admitted for further evaluation and management of refractory symptomatic hypoglycemia, associated with acute metabolic encephalopathy, with presentation notable for elevated blood pressure, hypokalemia, hypomagnesemia, and QTc prolongation.    Review of Systems: As per HPI otherwise 10 point review of systems negative.   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   Chronic back pain    Depression    Diabetes mellitus    ESRD (end stage renal disease) on dialysis [facility])    M,W,F   GERD (gastroesophageal reflux disease)    Heart murmur    Hypertension    NSTEMI (non-ST elevated myocardial infarction) (HCC) 12/20/2021   Palpitations    Parkinson's disease (HCC)     Past Surgical History:  Procedure Laterality Date   AV FISTULA PLACEMENT Left 08/01/2023   Procedure: LEFT ARM BRACHIOBASILIC FISTULA CREATION;  Surgeon: Daria Pastures, MD;  Location: [facility] OR;  Service: Vascular;  Laterality: Left;   BALLOON DILATION N/A 11/06/2022   Procedure: BALLOON DILATION;  Surgeon: Napoleon Form, MD;  Location: [facility]  ENDOSCOPY;  Service: Gastroenterology;  Laterality: N/A;   CARPAL TUNNEL RELEASE     COLONOSCOPY WITH PROPOFOL N/A 11/06/2022   Procedure: COLONOSCOPY WITH PROPOFOL;  Surgeon: Napoleon Form, MD;  Location: [facility] ENDOSCOPY;  Service: Gastroenterology;  Laterality: N/A;   ESOPHAGOGASTRODUODENOSCOPY (EGD) WITH PROPOFOL N/A 11/06/2022   Procedure: ESOPHAGOGASTRODUODENOSCOPY (EGD) WITH PROPOFOL;  Surgeon: Napoleon Form, MD;  Location: [facility] ENDOSCOPY;  Service: Gastroenterology;  Laterality: N/A;   INSERTION OF DIALYSIS CATHETER Right 08/01/2023   Procedure: INSERTION OF TUNNELED DIALYSIS CATHETER;  Surgeon: Daria Pastures, MD;  Location: [facility] OR;  Service: Vascular;  Laterality: Right;   IR FLUORO GUIDE CV LINE RIGHT  01/26/2022   IR US GUIDE VASC ACCESS RIGHT  01/26/2022   LEFT HEART CATH AND CORONARY ANGIOGRAPHY N/A 12/20/2021   Procedure: LEFT HEART CATH AND CORONARY ANGIOGRAPHY;  Surgeon: Kathleene Hazel, MD;  Location: [facility] INVASIVE CV LAB;  Service: Cardiovascular;  Laterality: N/A;   OPEN REDUCTION INTERNAL FIXATION (ORIF) FINGER WITH RADIAL BONE GRAFT Right 09/12/2015   Procedure: RIGHT MIDDLE FINGER CLOSED REDUCTION AND PINNING;  Surgeon: Bradly Bienenstock, MD;  Location: [facility] OR;  Service: Orthopedics;  Laterality: Right;   TONSILLECTOMY     TUBAL LIGATION      Social History:  reports that she has been smoking  cigarettes. She has a 0.8 pack-year smoking history. She has been exposed to tobacco smoke. She has never used smokeless tobacco. She reports current alcohol use. She reports current drug use. Drug: Cocaine.   Allergies  Allergen Reactions   Penicillins Shortness Of Breath and Other (See Comments)    Caused yeast infection Has patient had a PCN reaction causing immediate rash, facial/tongue/throat swelling, SOB or lightheadedness with hypotension: Yes Has patient had a PCN reaction causing severe rash involving mucus membranes or skin necrosis: No Has patient had a PCN reaction  that required hospitalization pt was in the hospital at the time of the reaction Has patient had a PCN reaction occurring within the last 10 years: No If all of the above answers are "NO", then may proceed with Cephalosp   Ultram [Tramadol] Other (See Comments)    Made her tongue raw    Family History  Problem Relation Age of Onset   Diabetes Mother    Kidney disease Mother    Seizures Father    Hypertension Father    Stroke Father    Asthma Other     Family history reviewed and not pertinent    Prior to Admission medications   Medication Sig Start Date End Date Taking? Authorizing Provider  albuterol (PROVENTIL HFA;VENTOLIN HFA) 108 (90 Base) MCG/ACT inhaler Inhale 2 puffs into the lungs every 4 (four) hours as needed for wheezing or shortness of breath.   Yes [provider]  amLODipine (NORVASC) 10 MG tablet Take 1 tablet (10 mg total) by mouth daily. 11/01/22  Yes Milford, Anderson Malta, FNP  aspirin EC 81 MG tablet Take 1 tablet (81 mg total) by mouth daily. Swallow whole. 05/11/22  Yes Quincy Simmonds, MD  atorvastatin (LIPITOR) 80 MG tablet Take 1 tablet (80 mg total) by mouth daily. 03/11/23  Yes Azucena Fallen, MD  budesonide-formoterol [facility]) 160-4.5 MCG/ACT inhaler Inhale 2 puffs into the lungs in the morning and at bedtime. 11/09/21  Yes [provider]  calcitRIOL (ROCALTROL) 0.25 MCG capsule Take 0.25 mcg by mouth daily. 07/22/23  Yes [provider]  cetirizine (ZYRTEC) 10 MG tablet Take 10 mg by mouth daily as needed for allergies.   Yes [provider]  Darbepoetin Alfa (ARANESP) 60 MCG/0.3ML SOSY injection Inject 0.3 mLs (60 mcg total) into the skin every Thursday at 6pm. 08/08/23  Yes Dezii, Alexandra, DO  eszopiclone 3 MG TABS Take 3 mg by mouth at bedtime. Take immediately before bedtime   Yes [provider]  furosemide (LASIX) 40 MG tablet Take 40 mg by mouth daily. 08/09/23  Yes [provider]  gabapentin  (NEURONTIN) 100 MG capsule Take 1 capsule (100 mg total) by mouth 3 (three) times daily. Patient taking differently: Take 100 mg by mouth 2 (two) times daily. 03/29/23 08/10/24 Yes Arrien, York Ram, MD  hydrALAZINE (APRESOLINE) 100 MG tablet 100 mg 3 (three) times daily. 07/29/23  Yes [provider]  isosorbide mononitrate (IMDUR) 30 MG 24 hr tablet Take 15 mg by mouth daily. 08/09/23  Yes [provider]  linagliptin (TRADJENTA) 5 MG TABS tablet Take 1 tablet (5 mg total) by mouth daily. Patient taking differently: Take 5 mg by mouth at bedtime. 11/08/22  Yes Ghimire, Werner Lean, MD  mirtazapine (REMERON) 7.5 MG tablet Take 1 tablet (7.5 mg total) by mouth at bedtime. 11/07/22  Yes Ghimire, Werner Lean, MD  naphazoline-pheniramine (NAPHCON-A) 0.025-0.3 % ophthalmic solution Place 2 drops into both eyes 2 (two) times daily.  Yes [provider]  nitroGLYCERIN (NITROSTAT) 0.4 MG SL tablet Place 1 tablet (0.4 mg total) under the tongue every 5 (five) minutes as needed for chest pain. 05/11/22 11/01/23 Yes Quincy Simmonds, MD  oxyCODONE-acetaminophen (PERCOCET) 10-325 MG tablet Take 1 tablet by mouth every 8 (eight) hours as needed for pain. 07/22/23 08/21/23 Yes [provider]  pantoprazole (PROTONIX) 40 MG tablet Take 1 tablet (40 mg total) by mouth daily at 12 noon. Patient taking differently: Take 40 mg by mouth at bedtime. 05/11/22  Yes Quincy Simmonds, MD  potassium chloride (KLOR-CON) 10 MEQ tablet Take 10 mEq by mouth daily. 08/09/23  Yes [provider]  torsemide (DEMADEX) 100 MG tablet Take 100 mg by mouth 2 (two) times daily. 08/09/23  Yes [provider]  triamcinolone ointment (KENALOG) 0.1 % 1 Application 2 (two) times daily as needed. 06/29/23  Yes [provider]  melatonin 5 MG TABS Take 1 tablet (5 mg total) by mouth at bedtime as needed (sleep). Patient not taking: Reported on 08/11/2023 11/07/22   Maretta Bees, MD      Objective    Physical Exam: Vitals:   08/11/23 0045 08/11/23 0200 08/11/23 0300 08/11/23 0330  BP: (!) 229/115 (!) 215/101 (!) 212/97 (!) 210/108  Pulse: 95 98 (!) 101 (!) 104  Resp: 17 19 19 20   Temp:    98.3 F (36.8 C)  TempSrc:    Oral  SpO2: 100% 100% 100% 100%    General: appears to be stated age; alert, oriented Skin: warm, dry, no rash Head:  AT/[place] Mouth:  Oral mucosa membranes appear moist, normal dentition Neck: supple; trachea midline Heart:  RRR; did not appreciate any M/R/G Lungs: CTAB, did not appreciate any wheezes, rales, or rhonchi Abdomen: + BS; soft, ND, NT Vascular: 2+ pedal pulses b/l; 2+ radial pulses b/l Extremities: no peripheral edema, no muscle wasting Neuro: strength and sensation intact in upper and lower extremities b/l    Labs on Admission: I have personally reviewed following labs and imaging studies  CBC: Recent Labs  Lab 08/05/23 0248 08/11/23 0003  WBC 6.4 7.8  NEUTROABS 2.6 6.4  HGB 8.2* 9.4*  HCT 25.5* 30.2*  MCV 88.5 91.5  PLT 220 247   Basic Metabolic Panel: Recent Labs  Lab 08/05/23 0248 08/11/23 0003  NA 131* 135  K 3.7 3.4*  CL 96* 100  CO2 27 26  GLUCOSE 111* 74  BUN 18 13  CREATININE 4.64* 3.55*  CALCIUM 7.8* 8.3*  MG 1.9 1.6*  PHOS 4.1  --    GFR: Estimated Creatinine Clearance: 17.2 mL/min (A) (by C-G formula based on SCr of 3.55 mg/dL (H)). Liver Function Tests: Recent Labs  Lab 08/05/23 0248 08/11/23 0003  AST 18 29  ALT 5 10  ALKPHOS 85 117  BILITOT 0.2 0.4  PROT 4.8* 6.5  ALBUMIN <1.5* 2.0*   No results for input(s): "LIPASE", "AMYLASE" in the last 168 hours. No results for input(s): "AMMONIA" in the last 168 hours. Coagulation Profile: No results for input(s): "INR", "PROTIME" in the last 168 hours. Cardiac Enzymes: No results for input(s): "CKTOTAL", "CKMB", "CKMBINDEX", "TROPONINI" in the last 168 hours. BNP (last 3 results) No results for input(s): "PROBNP" in the last 8760  hours. HbA1C: No results for input(s): "HGBA1C" in the last 72 hours. CBG: Recent Labs  Lab 08/10/23 2349 08/11/23 0035 08/11/23 0148 08/11/23 0241 08/11/23 0334  GLUCAP 68* 107* 42* 43* 112*   Lipid Profile: No results for input(s): "  CHOL", "HDL", "LDLCALC", "TRIG", "CHOLHDL", "LDLDIRECT" in the last 72 hours. Thyroid Function Tests: No results for input(s): "TSH", "T4TOTAL", "FREET4", "T3FREE", "THYROIDAB" in the last 72 hours. Anemia Panel: No results for input(s): "VITAMINB12", "FOLATE", "FERRITIN", "TIBC", "IRON", "RETICCTPCT" in the last 72 hours. Urine analysis:    Component Value Date/Time   COLORURINE STRAW (A) 08/11/2023 0230   APPEARANCEUR CLEAR 08/11/2023 0230   LABSPEC 1.004 (L) 08/11/2023 0230   PHURINE 8.0 08/11/2023 0230   GLUCOSEU 50 (A) 08/11/2023 0230   HGBUR NEGATIVE 08/11/2023 0230   BILIRUBINUR NEGATIVE 08/11/2023 0230   KETONESUR NEGATIVE 08/11/2023 0230   PROTEINUR >=300 (A) 08/11/2023 0230   UROBILINOGEN 0.2 01/29/2014 2116   NITRITE NEGATIVE 08/11/2023 0230   LEUKOCYTESUR NEGATIVE 08/11/2023 0230    Radiological Exams on Admission: CT HEAD WO CONTRAST ( ) Result Date: 08/11/2023 CLINICAL DATA:  Mental status change, unknown cause.  Hypoglycemia. EXAM: CT HEAD WITHOUT CONTRAST TECHNIQUE: Contiguous axial images were obtained from the base of the skull through the vertex without intravenous contrast. RADIATION DOSE REDUCTION: This exam was performed according to the departmental dose-optimization program which includes automated exposure control, adjustment of the mA and/or kV according to patient size and/or use of iterative reconstruction technique. 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 No acute intracranial abnormality. Specifically, no hemorrhage, hydrocephalus, mass lesion, acute infarction, or significant intracranial injury. Vascular: No  hyperdense vessel or unexpected calcification. Skull: No acute calvarial abnormality. Sinuses/Orbits: No acute findings Other: None IMPRESSION: No acute intracranial abnormality. Electronically Signed   By: Charlett Nose M.D.   On: 08/11/2023 01:06   DG Chest Port 1 View Result Date: 08/11/2023 CLINICAL DATA:  Weakness. EXAM: PORTABLE CHEST 1 VIEW COMPARISON:  August 04, 2023 FINDINGS: There is stable right-sided venous catheter positioning. The cardiac silhouette is mildly enlarged and unchanged in size. Mild, diffuse, chronic appearing increased interstitial lung markings are seen. Mild superimposed atelectasis and/or infiltrate is seen within the mid left lung and bilateral lung bases. No pleural effusion or pneumothorax is identified. The visualized skeletal structures are unremarkable. IMPRESSION: Chronic appearing increased interstitial lung markings with mild superimposed mid left lung and bibasilar atelectasis and/or infiltrate. Electronically Signed   By: Aram Candela M.D.   On: 08/11/2023 00:06      Assessment/Plan   Principal Problem:   Hypoglycemia Active Problems:   End stage renal disease (HCC)   DM2 (diabetes mellitus, type 2) (HCC)   Essential hypertension   borderline Prolonged QT interval   Acute metabolic encephalopathy   Hypomagnesemia   Hypokalemia   History of COPD   History of anemia due to chronic kidney disease    #) Hypoglycemia: In the setting of a history of type 2 diabetes mellitus, with most recent hemoglobin A1c noted to be 4.7% on 08/04/2023, the patient presents this evening with refractory symptomatic hypoglycemia, in spite of multiple amps of D50 in the ED, ultimately prompting initiation of D10 at 75 cc/h, with associated improvement in the patient's mental status back to baseline.  Her outpatient diabetic regimen is limited to Tradjenta, the absence of any additional oral hypoglycemic agents, including no sulfonylureas, and no basal short acting  insulin.  However, it appears that she is somewhat brittle in terms of her glycemic management in the setting of her end-stage renal disease with hemodialysis, will noting most recent hemoglobin A1c of 4.7%.  Can likely discontinue Tradjenta given the above.  No overt  evidence of underlying infectious contribution, including urinalysis that was inconsistent with UTI.  Her presenting chest x-ray showed potential multifocal airspace opacities, felt to represent atelectasis versus infiltrate.  However, suspect that these findings are more consistent with atelectasis given her presenting somnolence, altered mental status, in the absence of any acute respiratory symptoms nor any evidence of fever or leukocytosis.  Will add on procalcitonin level to further assess.  Additionally, she has documentation of a history of cocaine abuse, which could disrupt glycemic regulation.  She continues to produce a small amount of urine, will add on urinary drug screen.  No evidence of acute focal neurologic deficits to suggest contributory acute ischemic CVA, while noting that CT head showed no evidence of acute intracranial process.  Plan: For now, continue D10 at 75 cc/h.  I have ordered every 2 hours CBG monitoring x 3 occurrences followed by every 4 hours CBG monitoring.  Hold Tradjenta.  Prn amp of D50 for CBG less than 70.  Add on procalcitonin level.  Add on urinary drug screen.  Repeat CBC in the morning.                #) Acute metabolic encephalopathy: Intermittent confusion, somnolence, which she appears to correspond to refractory hypoglycemia, with improvement in mental status back to baseline with improving glycemic readings.  Given improvement in mental status with improving blood sugars and persistent hypertension, patient's altered mental status appears less likely to be as a consequence of her elevated blood pressure.  As noted above, no evidence of overt underlying infectious process, but will  also add on procalcitonin level to further assess multifocal airspace opacities, which appear this time to favor atelectasis.  Additionally, will check VBG to evaluate for hypercapnic encephalopathy given her history of COPD.  Plan: Further evaluation management of presenting refractory hypoglycemia, as above.  Check urinary drug screen, procalcitonin level, B12, INR, TSH, VBG.  Repeat CMP, CBC in the morning.                #) Multifocal airspace opacities: Noted on presenting chest x-ray, favoring atelectasis versus infiltrate radiographically, and with presentation less suggestive of underlying pneumonia in the absence of any acute respiratory symptoms as well as the absence of fever and leukocytosis.  Presentation also appears consistent with increased risk for atelectasis given her intermittent altered mental status/somnolence in the setting of refractory hypoglycemia.  Will refrain from initiation of IV antibiotics for now, but will check procalcitonin level to further evaluate.  Plan: Add on procalcitonin level, BNP.  Assess from a tree ordered.  Check blood gas.                 #) Essential Hypertension: documented h/o such, with outpatient antihypertensive regimen including hydralazine, amlodipine.  It appears that her blood pressure has been poorly controlled as outpatient, prompting recent new prescription for Imdur.  Patient conveys that she has not yet started her Imdur, and also notes that she has missed 2 of her most recent doses of hydralazine in the setting of her presenting altered mental status, likely contributory to her presenting elevated blood pressure.  Consequently, we will resume her home oral hydralazine and amlodipine at this time, and closely monitor ensuing blood pressure response to resumption of these outpatient hypertensive medications to guide additional evaluation and management thereof.   Plan: Close monitoring of subsequent BP via routine  VS. resume home hydralazine and Norvasc, with next doses to occur now.  Holding home Imdur for now.  As needed IV labetalol.  Monitor on telemetry.  Monitor strict I's and O's and daily weights.  Check urinary drug screen per                   #) Hypokalemia: presenting potassium level noted to be 3.4.  In the setting of her end-stage renal disease on hemodialysis, will refrain from aggressive potassium supplementation for now, but rather repeat potassium level with morning CMP to further guide need for associated supplementation.  Of note, presenting magnesium level found to be low, as further discussed below.  Plan: monitor on tele.CMP in the AM.  Further evaluation management of concomitant hypomagnesemia, as below.                 #) Hypomagnesemia: presenting serum mag level noted to be 1.6.    Plan: magnesium sulfate 2 g IV over 2 hours. Monitor on tele. Repeat serum mag level in the AM.                     #) QTc prolongation: Presenting EKG demonstrates QTc of 526 ms. notable laboratory findings include finding of hypomagnesemia.  Plan: Monitor on telemetry.  Magnesium sulfate 2 g IV over 2 hours with repeat magnesium level ordered for the morning repeat EKG in the morning to monitor interval degree of QTc prolongation.                 #) ESRD: on HD (schedule: Monday, Wednesday, Friday).  No recently missed sessions, with patient conveying that her most recently completed session occurred on Friday, 08/09/2023 next due for routine HD on Monday, 08/12/2023. No clinical evidence for urgent overnight HD or to expedite HD relative to this timeframe, including no evidence of hyperkalemia, anion gap metabolic acidosis, uremia, or volume overload..   Plan: monitor strict I's/O's, daily weights. CMP in the AM. Check mag and phos levels. Continue outpatient calcitriol                   #) COPD: Documented history  thereof, without clinical evidence of acute exacerbation at this time.  Outpatient respiratory regimen includes the following: Scheduled Symbicort as well as as needed albuterol inhaler.   Plan: cont outpatient Symbicort. Prn albuterol nebulizer. Check CMP and serum magnesium level in the AM.  Follow for result of VBG, as above.                #) Anemia of chronic kidney disease: Documented history of such, a/w with baseline hgb range 8-10, with presenting hgb consistent with this range, in the absence of any overt evidence of active bleed.     Plan: Repeat CBC in the morning.  Follow-up result of INR.      DVT prophylaxis: SCD's   Code Status: Full code Family Communication: none Disposition Plan: Per Rounding Team Consults called: none;  Admission status: inpatient     I SPENT GREATER THAN 75  MINUTES IN CLINICAL CARE TIME/MEDICAL DECISION-MAKING IN COMPLETING THIS ADMISSION.      Chaney Born Harold Mattes DO [facility]  From 7PM - 7AM   08/11/2023, 4:31 AM

## 2023-08-12 ENCOUNTER — Telehealth (HOSPITAL_COMMUNITY): Payer: Self-pay | Admitting: Pharmacy Technician

## 2023-08-12 ENCOUNTER — Other Ambulatory Visit (HOSPITAL_COMMUNITY): Payer: Self-pay

## 2023-08-12 ENCOUNTER — Other Ambulatory Visit: Payer: Self-pay

## 2023-08-12 DIAGNOSIS — E162 Hypoglycemia, unspecified: Secondary | ICD-10-CM | POA: Diagnosis not present

## 2023-08-12 LAB — GLUCOSE, CAPILLARY
Glucose-Capillary: 111 mg/dL — ABNORMAL HIGH (ref 70–99)
Glucose-Capillary: 118 mg/dL — ABNORMAL HIGH (ref 70–99)
Glucose-Capillary: 124 mg/dL — ABNORMAL HIGH (ref 70–99)
Glucose-Capillary: 127 mg/dL — ABNORMAL HIGH (ref 70–99)
Glucose-Capillary: 155 mg/dL — ABNORMAL HIGH (ref 70–99)
Glucose-Capillary: 175 mg/dL — ABNORMAL HIGH (ref 70–99)
Glucose-Capillary: 179 mg/dL — ABNORMAL HIGH (ref 70–99)
Glucose-Capillary: 215 mg/dL — ABNORMAL HIGH (ref 70–99)
Glucose-Capillary: 61 mg/dL — ABNORMAL LOW (ref 70–99)
Glucose-Capillary: 89 mg/dL (ref 70–99)

## 2023-08-12 LAB — RENAL FUNCTION PANEL
Albumin: 1.5 g/dL — ABNORMAL LOW (ref 3.5–5.0)
Anion gap: 8 (ref 5–15)
BUN: 8 mg/dL (ref 6–20)
CO2: 26 mmol/L (ref 22–32)
Calcium: 7.5 mg/dL — ABNORMAL LOW (ref 8.9–10.3)
Chloride: 96 mmol/L — ABNORMAL LOW (ref 98–111)
Creatinine, Ser: 3.15 mg/dL — ABNORMAL HIGH (ref 0.44–1.00)
GFR, Estimated: 16 mL/min — ABNORMAL LOW (ref >60)
Glucose, Bld: 104 mg/dL — ABNORMAL HIGH (ref 70–99)
Phosphorus: 1.9 mg/dL — ABNORMAL LOW (ref 2.5–4.6)
Potassium: 3.9 mmol/L (ref 3.5–5.1)
Sodium: 130 mmol/L — ABNORMAL LOW (ref 135–145)

## 2023-08-12 LAB — HEPATITIS B SURFACE ANTIGEN: Hepatitis B Surface Ag: NONREACTIVE

## 2023-08-12 NOTE — Hospital Course (Addendum)
This patient is a 62 year old female with ESRD on Monday Wednesday Friday hemodialysis, history of diabetes, hypertension, polysubstance abuse, COPD, who presents on 12/21 with symptomatic hypoglycemia.  Blood glucose at home reportedly in the 20s and 40s.  Patient was confused and somnolent on arrival.  She required treatment with IV dextrose.  Patient has been on monotherapy with Tradjenta outpatient, no other oral antihyperglycemics or insulin found.  She is a fairly new ESRD patient, initiated on hemodialysis within the last 30 days.  It is suspected that her hypoglycemia was related to Tradjenta combined with her new ESRD status and polysubstance abuse, making her a brittle diabetic.  Hemoglobin A1c reveals 4.7%.  We have discontinued all diabetes medications and discussed extensively with the patient.  She endorses understanding.   Patient was transitioned to D10 drip which was then discontinued on 12/24.  She maintained stable CBGs for 24 hours and had adequate oral intake.  Her metabolic encephalopathy resolved completely and patient returned to her baseline. I had extensive discussion regarding her medications at home, as polypharmacy is certainly playing a role in her ability to manage medications outpatient. It appears patient was prescribed multiple diuretics but she is not taking any of them.  We have discontinued these at discharge. While admitted, we have titrated her blood pressure medications.  We did discuss extensively the dangers of opioid use, especially in the setting of ESRD and with concurrent sleep aids (lunesta).  Patient is taking high-dose oxycodone at home and refuses to taper to a lower dose.  After extensive discussion, I have provided Narcan for her at discharge and instructed on appropriate use.  Her husband was at bedside for this discussion and endorses understanding.  Additionally patient is taking gabapentin 3 times daily, we have decreased this to twice daily and encourage  further tapering.  Regarding her polysubstance abuse, patient does endorse that she has been sober of all substances for the last 30 days.  I have encouraged continued abstinence.

## 2023-08-12 NOTE — Plan of Care (Signed)
BG getting better. HD tomorrow.    Problem: Education: Goal: Knowledge of General Education information will improve Description: Including pain rating scale, medication(s)/side effects and non-pharmacologic comfort measures Outcome: Not Met (add Reason)

## 2023-08-12 NOTE — TOC Initial Note (Signed)
Transition of Care [facility]) - Initial/Assessment Note    Patient Details  Name: Carol [name] MRN: [id number] Date of Birth: 05/29/1961  [facility]) CM/SW Contact:    Kermit Balo, RN Phone Number: 08/12/2023, 2:12 PM  Clinical Narrative:                  Pt is from home with her Significant other. She states she has issues taking her medications at home correctly and inquired about someone assisting with her pill box. CM offered a RN to come and do med management at home. Pt in agreement. Choice with [email or web address] offered and [facility] decided on. [facility] will contact her for the first home visit. She denies issues with transportation and is working on someone to pick her up tomorrow. ToC following.  Expected Discharge Plan: Home w Home Health Services Barriers to Discharge: Continued Medical Work up   Patient Goals and CMS Choice   CMS Medicare.gov Compare Post Acute Care list provided to:: Patient Choice offered to / list presented to : Patient      Expected Discharge Plan and Services   Discharge Planning Services: CM Consult Post Acute Care Choice: Home Health Living arrangements for the past 2 months: Apartment                           HH Arranged: RN HH Agency: [facility] Date [facility] Agency Contacted: 08/12/23   Representative spoke with at [facility] Agency: Tresa Endo  Prior Living Arrangements/Services Living arrangements for the past 2 months: Apartment Lives with:: Significant Other Patient language and need for interpreter reviewed:: Yes Do you feel safe going back to the place where you live?: Yes        Care giver support system in place?: Yes (comment) Current home services: DME (walker/ cane) Criminal Activity/Legal Involvement Pertinent to Current Situation/Hospitalization: No - Comment as needed  Activities of Daily Living   ADL Screening (condition at time of admission) Independently performs ADLs?: No Does the  patient have a NEW difficulty with bathing/dressing/toileting/self-feeding that is expected to last >3 days?: No Does the patient have a NEW difficulty with getting in/out of bed, walking, or climbing stairs that is expected to last >3 days?: No Does the patient have a NEW difficulty with communication that is expected to last >3 days?: No Is the patient deaf or have difficulty hearing?: No Does the patient have difficulty seeing, even when wearing glasses/contacts?: No Does the patient have difficulty concentrating, remembering, or making decisions?: No  Permission Sought/Granted                  Emotional Assessment Appearance:: Appears stated age Attitude/Demeanor/Rapport: Engaged Affect (typically observed): Accepting Orientation: : Oriented to Self, Oriented to Place, Oriented to  Time, Oriented to Situation   Psych Involvement: No (comment)  Admission diagnosis:  Hypoglycemia [E16.2] Patient Active Problem List   Diagnosis Date Noted   Hypokalemia 08/11/2023   History of COPD 08/11/2023   History of anemia due to chronic kidney disease 08/11/2023   End stage renal disease (HCC) 08/03/2023   Overweight (BMI 25.0-29.9) 08/03/2023   Chest pain 07/31/2023   Hypertensive emergency 07/31/2023   Anemia of chronic disease 07/31/2023   Chronic pain 07/31/2023   Polysubstance abuse (HCC) 07/31/2023   COPD (chronic obstructive pulmonary disease) (HCC) 03/27/2023   Chronic anemia 03/27/2023   Acute on chronic systolic (congestive) heart failure (HCC) 03/08/2023   Volume overload  03/07/2023   Heme positive stool 11/06/2022   Anemia due to chronic blood loss 11/06/2022   Colon cancer screening 11/03/2022   Hypoglycemia 11/02/2022   MDD (major depressive disorder), recurrent episode, moderate (HCC) 11/02/2022   Abnormal barium swallow 11/02/2022   Esophageal stricture 11/02/2022   Esophageal dysmotility 11/02/2022   Antiplatelet or antithrombotic long-term use 11/02/2022    Chronic diastolic CHF (congestive heart failure) (HCC) 11/01/2022   Dysphagia 11/01/2022   Acute on chronic diastolic CHF (congestive heart failure) (HCC) 05/09/2022   Metabolic acidosis, increased anion gap 05/09/2022   Acute on chronic diastolic (congestive) heart failure (HCC) 03/22/2022   COPD not affecting current episode of care 03/22/2022   Class 2 obesity due to excess calories with body mass index (BMI) of 37.0 to 37.9 in adult 03/22/2022   Hypomagnesemia 03/15/2022   CHF (congestive heart failure) (HCC) 03/14/2022   Acute metabolic encephalopathy 03/10/2022   borderline Prolonged QT interval 01/22/2022   Elevated troponin 01/22/2022   Thyroid nodule 01/22/2022   Cocaine use disorder, mild, abuse (HCC) 01/02/2022   Diabetic hypoglycemia (HCC) 12/31/2021   CKD (chronic kidney disease) stage 4, GFR 15-29 ml/min (HCC) 12/31/2021   CAD in native artery 12/24/2021   Anxiety with depression 12/24/2021   Pure hypercholesterolemia    DM2 (diabetes mellitus, type 2) (HCC) 01/29/2014   Essential hypertension 01/29/2014   Closed jaw fracture (HCC) 01/29/2014   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 [place] [address] Phone: [phone] Fax: [phone]  [facility] [address] [place] [address] Phone: [phone] Fax: [phone]     Social Drivers of Health (SDOH) Social History: SDOH Screenings   Food Insecurity: Food Insecurity Present (08/12/2023)  Housing: Low Risk  (08/12/2023)  Transportation Needs: No Transportation Needs (07/31/2023)  Utilities: Not At Risk (08/12/2023)  Financial Resource Strain: Low Risk  (04/04/2022)  Tobacco Use: High Risk (08/11/2023)   SDOH Interventions:     Readmission Risk Interventions    08/02/2023    5:29 PM 03/25/2023    4:51 PM 03/27/2022   10:36  AM  Readmission Risk Prevention Plan  Transportation Screening Complete Complete Complete  Medication Review Oceanographer) Complete Complete Complete  PCP or Specialist appointment within 3-5 days of discharge   Complete  HRI or Home Care Consult Complete Complete Complete  SW Recovery Care/Counseling Consult   Complete  Palliative Care Screening Not Applicable Not Applicable Not Applicable  Skilled Nursing Facility Not Applicable Not Applicable Not Applicable

## 2023-08-12 NOTE — Progress Notes (Signed)
 Pt receives out-pt HD at [facility]CU at [facility] on Mon, Tues, Thurs, Fri. Will assist as needed.   Olivia Canter Renal Navigator [phone]

## 2023-08-12 NOTE — Progress Notes (Signed)
Carol [name] is an 62 y.o. female w/ DM, HTN, CASHD, Parkinson's, ESRD presented to ED w/ c/o hypoglycemia. Family noticed pt was somnolent and a bit confused earlier yest evening, and her BS was in the 40s. She did eat and improved shortly but then got worse again.  Takes tradjenta as her only DM medication. Last A1c was 4.7% on 12/15. Has not missed HD sessions (MWF).   Just started D on 12/12 and was dc'd home on 12/16.    Renal-related home meds: - norvasc 10, hydralazine 100 tid, imdur 15 - lasix 40 daily, demadex 100 bid - gabapentin 100 tid - klorcon 10 qd    OP HD: [facility] 4d/wk (usually [facility]).  - 3h   400/1.5  3K/2.5Ca bath  78kg  TDC/ 1st BB AVF 08/01/23 (maturing)  Hep 2000 - mircera 100 mcg q 2, start 2/19 - rocaltrol 0.5 mcg q hd  Assessment/Plan: Hypoglycemia - consuming cake/ soda; feels better now. Trandjenta (linagliptin) is not thought to be dialyzable.  ESKD - on HD 4d/wk at the [facility]CU. Will plan HD here today on holiday schedule, and cont MWF holiday schedule while here this week (=sun-tues-Friday).  HTN - BP's high on arrival in 200s, now 170/85. Cont home meds.  Volume - exam shows mild LE edema, clear lungs. UF goal 2.5- 3 L as tol. CXR I think is overpenetrated. On RA.  Anemia of eskd - Hb 8- 9 here. Get OP records.  MBD ckd - CCa in range, phos is very low at 1.6- pharm recommending oral phos NAK, will give tid for 2 days. Check RFP daily x 3 while here. Hold any binders.  DM2 COPD  Subjective: Hypoglycemic on dialysis last night but otherwise tolerated treatment without any hypotension cramping CP fever chills.   Chemistry and CBC: Creat  Date/Time Value Ref Range Status  04/03/2023 04:05 PM 4.87 (H) 0.50 - 1.05 mg/dL Final  16/05/9603 54:09 AM 3.54 (H) 0.50 - 1.05 mg/dL Final  81/19/1478 29:56 PM 1.36 (H) 0.50 - 1.05 mg/dL Final    Comment:    For patients >34 years of age, the reference limit for Creatinine is approximately 13% higher for  people identified as African-American. .    Creatinine, Ser  Date/Time Value Ref Range Status  08/12/2023 05:45 AM 3.15 (H) 0.44 - 1.00 mg/dL Final  21/30/8657 84:69 AM 3.66 (H) 0.44 - 1.00 mg/dL Final  62/95/2841 32:44 AM 3.55 (H) 0.44 - 1.00 mg/dL Final  08/22/7251 66:44 AM 4.64 (H) 0.44 - 1.00 mg/dL Final  03/47/4259 56:38 AM 3.70 (H) 0.44 - 1.00 mg/dL Final  75/64/3329 51:88 AM 4.44 (H) 0.44 - 1.00 mg/dL Final  41/66/0630 16:01 AM 5.57 (H) 0.44 - 1.00 mg/dL Final  09/32/3557 32:20 AM 7.49 (H) 0.44 - 1.00 mg/dL Final  25/42/7062 37:62 AM 7.10 (H) 0.44 - 1.00 mg/dL Final  83/15/1761 60:73 PM 7.14 (H) 0.44 - 1.00 mg/dL Final  71/01/2693 85:46 AM 4.22 (H) 0.44 - 1.00 mg/dL Final  27/10/5007 38:18 AM 4.14 (H) 0.44 - 1.00 mg/dL Final  29/93/7169 67:89 AM 4.10 (H) 0.44 - 1.00 mg/dL Final  38/05/1750 02:58 AM 4.27 (H) 0.44 - 1.00 mg/dL Final  52/77/8242 35:36 AM 4.23 (H) 0.44 - 1.00 mg/dL Final  14/43/1540 08:67 AM 4.32 (H) 0.44 - 1.00 mg/dL Final  61/95/0932 67:12 PM 4.25 (H) 0.44 - 1.00 mg/dL Final  45/80/9983 38:25 AM 4.22 (H) 0.44 - 1.00 mg/dL Final  05/39/7673 41:93 AM 4.00 (H) 0.44 - 1.00 mg/dL  Final  03/11/2023 12:25 AM 4.03 (H) 0.44 - 1.00 mg/dL Final  91/47/8295 62:13 AM 3.85 (H) 0.44 - 1.00 mg/dL Final  08/65/7846 96:29 AM 3.36 (H) 0.44 - 1.00 mg/dL Final  52/84/1324 40:10 PM 3.48 (H) 0.44 - 1.00 mg/dL Final  27/25/3664 40:34 PM 3.49 (H) 0.44 - 1.00 mg/dL Final  74/25/9563 87:56 PM 4.61 (H) 0.44 - 1.00 mg/dL Final  43/32/9518 84:16 AM 3.25 (H) 0.44 - 1.00 mg/dL Final  60/63/0160 10:93 AM 3.56 (H) 0.44 - 1.00 mg/dL Final  23/55/7322 02:54 AM 3.67 (H) 0.44 - 1.00 mg/dL Final  27/01/2375 28:31 AM 3.74 (H) 0.44 - 1.00 mg/dL Final  51/76/1607 37:10 AM 3.38 (H) 0.44 - 1.00 mg/dL Final  62/69/4854 62:70 AM 2.76 (H) 0.44 - 1.00 mg/dL Final  35/00/9381 82:99 AM 2.85 (H) 0.44 - 1.00 mg/dL Final  37/16/9678 93:81 AM 2.62 (H) 0.44 - 1.00 mg/dL Final  01/75/1025 85:27 PM 3.05 (H)  0.44 - 1.00 mg/dL Final  78/24/2353 61:44 AM 3.10 (H) 0.44 - 1.00 mg/dL Final  31/54/0086 76:19 PM 2.96 (H) 0.44 - 1.00 mg/dL Final  50/93/2671 24:58 AM 3.02 (H) 0.44 - 1.00 mg/dL Final  09/98/3382 50:53 AM 3.11 (H) 0.44 - 1.00 mg/dL Final  97/67/3419 37:90 AM 2.32 (H) 0.44 - 1.00 mg/dL Final  24/04/7352 29:92 AM 2.33 (H) 0.44 - 1.00 mg/dL Final  42/68/3419 62:22 PM 3.48 (H) 0.44 - 1.00 mg/dL Final  97/98/9211 94:17 PM 2.05 (H) 0.44 - 1.00 mg/dL Final  40/81/4481 85:63 AM 2.46 (H) 0.44 - 1.00 mg/dL Final  14/97/0263 78:58 AM 2.64 (H) 0.44 - 1.00 mg/dL Final  85/09/7739 28:78 AM 2.97 (H) 0.44 - 1.00 mg/dL Final  67/67/2094 70:96 AM 2.72 (H) 0.44 - 1.00 mg/dL Final  28/36/6294 76:54 AM 2.55 (H) 0.44 - 1.00 mg/dL Final  65/10/5463 68:12 AM 2.67 (H) 0.44 - 1.00 mg/dL Final  75/17/0017 49:44 AM 3.22 (H) 0.44 - 1.00 mg/dL Final  96/75/9163 84:66 AM 2.96 (H) 0.44 - 1.00 mg/dL Final  59/93/5701 77:93 AM 2.66 (H) 0.44 - 1.00 mg/dL Final  90/30/0923 30:07 AM 2.75 (H) 0.44 - 1.00 mg/dL Final   Recent Labs  Lab 08/11/23 0003 08/11/23 0157 08/11/23 0419 08/12/23 0545  NA 135  --  134* 130*  K 3.4*  --  3.3* 3.9  CL 100  --  105 96*  CO2 26  --  24 26  GLUCOSE 74  --  110* 104*  BUN 13  --  15 8  CREATININE 3.55*  --  3.66* 3.15*  CALCIUM 8.3*  --  7.7* 7.5*  PHOS  --  1.6*  --  1.9*   Recent Labs  Lab 08/11/23 0003 08/11/23 0419  WBC 7.8 7.3  NEUTROABS 6.4 5.1  HGB 9.4* 8.1*  HCT 30.2* 26.0*  MCV 91.5 90.6  PLT 247 214   Liver Function Tests: Recent Labs  Lab 08/11/23 0003 08/11/23 0419 08/12/23 0545  AST 29 26  --   ALT 10 11  --   ALKPHOS 117 107  --   BILITOT 0.4 0.3  --   PROT 6.5 5.3*  --   ALBUMIN 2.0* 1.7* 1.5*   No results for input(s): "LIPASE", "AMYLASE" in the last 168 hours. No results for input(s): "AMMONIA" in the last 168 hours. Cardiac Enzymes: No results for input(s): "CKTOTAL", "CKMB", "CKMBINDEX", "TROPONINI" in the last 168 hours. Iron Studies:  No results for input(s): "IRON", "TIBC", "TRANSFERRIN", "FERRITIN" in the last 72 hours. PT/INR: @LABRCNTIP (inr:5)  Xrays/Other  Studies: ) Results for orders placed or performed during the hospital encounter of 08/10/23 (from the past 48 hours)  CBG monitoring, ED     Status: Abnormal   Collection Time: 08/10/23 11:49 PM  Result Value Ref Range   Glucose-Capillary 68 (L) 70 - 99 mg/dL    Comment: Glucose reference range applies only to samples taken after fasting for at least 8 hours.  CBC with Differential/Platelet     Status: Abnormal   Collection Time: 08/11/23 12:03 AM  Result Value Ref Range   WBC 7.8 4.0 - 10.5 K/uL   RBC 3.30 (L) 3.87 - 5.11 MIL/uL   Hemoglobin 9.4 (L) 12.0 - 15.0 g/dL   HCT 27.0 (L) 62.3 - 76.2 %   MCV 91.5 80.0 - 100.0 fL   MCH 28.5 26.0 - 34.0 pg   MCHC 31.1 30.0 - 36.0 g/dL   RDW 83.1 51.7 - 61.6 %   Platelets 247 150 - 400 K/uL   nRBC 0.3 (H) 0.0 - 0.2 %   Neutrophils Relative % 82 %   Neutro Abs 6.4 1.7 - 7.7 K/uL   Lymphocytes Relative 10 %   Lymphs Abs 0.7 0.7 - 4.0 K/uL   Monocytes Relative 6 %   Monocytes Absolute 0.5 0.1 - 1.0 K/uL   Eosinophils Relative 0 %   Eosinophils Absolute 0.0 0.0 - 0.5 K/uL   Basophils Relative 0 %   Basophils Absolute 0.0 0.0 - 0.1 K/uL   Immature Granulocytes 2 %   Abs Immature Granulocytes 0.16 (H) 0.00 - 0.07 K/uL    Comment: Performed at [facility], [address] [place], [place] [address]  Comprehensive metabolic panel     Status: Abnormal   Collection Time: 08/11/23 12:03 AM  Result Value Ref Range   Sodium 135 135 - 145 mmol/L   Potassium 3.4 (L) 3.5 - 5.1 mmol/L   Chloride 100 98 - 111 mmol/L   CO2 26 22 - 32 mmol/L   Glucose, Bld 74 70 - 99 mg/dL    Comment: Glucose reference range applies only to samples taken after fasting for at least 8 hours.   BUN 13 6 - 20 mg/dL   Creatinine, Ser 0.62 (H) 0.44 - 1.00 mg/dL   Calcium 8.3 (L) 8.9 - 10.3 mg/dL   Total Protein 6.5 6.5 - 8.1 g/dL    Albumin 2.0 (L) 3.5 - 5.0 g/dL   AST 29 15 - 41 U/L   ALT 10 0 - 44 U/L   Alkaline Phosphatase 117 38 - 126 U/L   Total Bilirubin 0.4 <1.2 mg/dL   GFR, Estimated 14 (L) >60 mL/min    Comment: (NOTE) Calculated using the CKD-EPI Creatinine Equation (2021)    Anion gap 9 5 - 15    Comment: Performed at [facility], [address], [place], [place] [address]  Magnesium     Status: Abnormal   Collection Time: 08/11/23 12:03 AM  Result Value Ref Range   Magnesium 1.6 (L) 1.7 - 2.4 mg/dL    Comment: Performed at [facility], [address], [place], [place] [address]  Troponin I (High Sensitivity)     Status: Abnormal   Collection Time: 08/11/23 12:03 AM  Result Value Ref Range   Troponin I (High Sensitivity) 23 (H) <18 ng/L    Comment: (NOTE) Elevated high sensitivity troponin I (hsTnI) values and significant  changes across serial measurements may suggest ACS but many other  chronic and acute  conditions are known to elevate hsTnI results.  Refer to the "Links" section for chest pain algorithms and additional  guidance. Performed at [facility], [address] [place], [place] [address]   TSH     Status: None   Collection Time: 08/11/23 12:03 AM  Result Value Ref Range   TSH 0.878 0.350 - 4.500 uIU/mL    Comment: Performed by a 3rd Generation assay with a functional sensitivity of <=0.01 uIU/mL. Performed at [facility], [address] [place], [place] [address]   CBG monitoring, ED     Status: Abnormal   Collection Time: 08/11/23 12:35 AM  Result Value Ref Range   Glucose-Capillary 107 (H) 70 - 99 mg/dL    Comment: Glucose reference range applies only to samples taken after fasting for at least 8 hours.  CBG monitoring, ED     Status: Abnormal   Collection Time: 08/11/23  1:48 AM  Result Value Ref Range   Glucose-Capillary 42 (LL) 70 - 99 mg/dL    Comment: Glucose reference range applies only to samples taken after fasting for at least 8 hours.    Comment 1 Notify RN   Procalcitonin     Status: None   Collection Time: 08/11/23  1:57 AM  Result Value Ref Range   Procalcitonin 0.12 ng/mL    Comment:        Interpretation: PCT (Procalcitonin) <= 0.5 ng/mL: Systemic infection (sepsis) is not likely. Local bacterial infection is possible. (NOTE)       Sepsis PCT Algorithm           Lower Respiratory Tract                                      Infection PCT Algorithm    ----------------------------     ----------------------------         PCT < 0.25 ng/mL                PCT < 0.10 ng/mL          Strongly encourage             Strongly discourage   discontinuation of antibiotics    initiation of antibiotics    ----------------------------     -----------------------------       PCT 0.25 - 0.50 ng/mL            PCT 0.10 - 0.25 ng/mL               OR       >80% decrease in PCT            Discourage initiation of                                            antibiotics      Encourage discontinuation           of antibiotics    ----------------------------     -----------------------------         PCT >= 0.50 ng/mL              PCT 0.26 - 0.50 ng/mL               AND        <80% decrease in PCT  Encourage initiation of                                             antibiotics       Encourage continuation           of antibiotics    ----------------------------     -----------------------------        PCT >= 0.50 ng/mL                  PCT > 0.50 ng/mL               AND         increase in PCT                  Strongly encourage                                      initiation of antibiotics    Strongly encourage escalation           of antibiotics                                     -----------------------------                                           PCT <= 0.25 ng/mL                                                 OR                                        > 80% decrease in PCT                                       Discontinue / Do not initiate                                             antibiotics  Performed at [facility], [address], [place], [place] [address]   Phosphorus     Status: Abnormal   Collection Time: 08/11/23  1:57 AM  Result Value Ref Range   Phosphorus 1.6 (L) 2.5 - 4.6 mg/dL    Comment: Performed at [facility], [address], [place], [place] [address]  Troponin I (High Sensitivity)     Status: Abnormal   Collection Time: 08/11/23  1:58 AM  Result Value Ref Range   Troponin I (High Sensitivity) 23 (H) <18 ng/L    Comment: (NOTE) Elevated high sensitivity troponin I (hsTnI) values and significant  changes across serial measurements may suggest ACS but many other  chronic and  acute conditions are known to elevate hsTnI results.  Refer to the "Links" section for chest pain algorithms and additional  guidance. Performed at [facility], [address] [place], [place] [address]   Brain natriuretic peptide     Status: Abnormal   Collection Time: 08/11/23  1:58 AM  Result Value Ref Range   B Natriuretic Peptide 1,479.1 (H) 0.0 - 100.0 pg/mL    Comment: Performed at [facility], [address], [place], [place] [address]  Urinalysis, w/ Reflex to Culture (Infection Suspected) -Urine, Clean Catch     Status: Abnormal   Collection Time: 08/11/23  2:30 AM  Result Value Ref Range   Specimen Source URINE, CLEAN CATCH    Color, Urine STRAW (A) YELLOW   APPearance CLEAR CLEAR   Specific Gravity, Urine 1.004 (L) 1.005 - 1.030   pH 8.0 5.0 - 8.0   Glucose, UA 50 (A) NEGATIVE mg/dL   Hgb urine dipstick NEGATIVE NEGATIVE   Bilirubin Urine NEGATIVE NEGATIVE   Ketones, ur NEGATIVE NEGATIVE mg/dL   Protein, ur >=130 (A) NEGATIVE mg/dL   Nitrite NEGATIVE NEGATIVE   Leukocytes,Ua NEGATIVE NEGATIVE   RBC / HPF 0-5 0 - 5 RBC/hpf   WBC, UA 0-5 0 - 5 WBC/hpf    Comment:        Reflex urine culture not performed if WBC <=10, OR if Squamous  epithelial cells >5. If Squamous epithelial cells >5 suggest recollection.    Bacteria, UA RARE (A) NONE SEEN   Squamous Epithelial / HPF 0-5 0 - 5 /HPF    Comment: Performed at [facility], [address] [place], [place] [address]  CBG monitoring, ED     Status: Abnormal   Collection Time: 08/11/23  2:41 AM  Result Value Ref Range   Glucose-Capillary 43 (LL) 70 - 99 mg/dL    Comment: Glucose reference range applies only to samples taken after fasting for at least 8 hours.   Comment 1 Notify RN   CBG monitoring, ED     Status: Abnormal   Collection Time: 08/11/23  3:34 AM  Result Value Ref Range   Glucose-Capillary 112 (H) 70 - 99 mg/dL    Comment: Glucose reference range applies only to samples taken after fasting for at least 8 hours.  CBC with Differential/Platelet     Status: Abnormal   Collection Time: 08/11/23  4:19 AM  Result Value Ref Range   WBC 7.3 4.0 - 10.5 K/uL   RBC 2.87 (L) 3.87 - 5.11 MIL/uL   Hemoglobin 8.1 (L) 12.0 - 15.0 g/dL   HCT 46.9 (L) 62.9 - 52.8 %   MCV 90.6 80.0 - 100.0 fL   MCH 28.2 26.0 - 34.0 pg   MCHC 31.2 30.0 - 36.0 g/dL   RDW 41.3 24.4 - 01.0 %   Platelets 214 150 - 400 K/uL   nRBC 0.4 (H) 0.0 - 0.2 %   Neutrophils Relative % 71 %   Neutro Abs 5.1 1.7 - 7.7 K/uL   Lymphocytes Relative 14 %   Lymphs Abs 1.0 0.7 - 4.0 K/uL   Monocytes Relative 12 %   Monocytes Absolute 0.9 0.1 - 1.0 K/uL   Eosinophils Relative 1 %   Eosinophils Absolute 0.1 0.0 - 0.5 K/uL   Basophils Relative 0 %   Basophils Absolute 0.0 0.0 - 0.1 K/uL   Immature Granulocytes 2 %   Abs Immature Granulocytes 0.11 (H) 0.00 - 0.07 K/uL  Comment: Performed at [facility], [address], [place], [place] [address]  Comprehensive metabolic panel     Status: Abnormal   Collection Time: 08/11/23  4:19 AM  Result Value Ref Range   Sodium 134 (L) 135 - 145 mmol/L   Potassium 3.3 (L) 3.5 - 5.1 mmol/L   Chloride 105 98 - 111 mmol/L   CO2 24 22 - 32 mmol/L    Glucose, Bld 110 (H) 70 - 99 mg/dL    Comment: Glucose reference range applies only to samples taken after fasting for at least 8 hours.   BUN 15 6 - 20 mg/dL   Creatinine, Ser 8.54 (H) 0.44 - 1.00 mg/dL   Calcium 7.7 (L) 8.9 - 10.3 mg/dL   Total Protein 5.3 (L) 6.5 - 8.1 g/dL   Albumin 1.7 (L) 3.5 - 5.0 g/dL   AST 26 15 - 41 U/L   ALT 11 0 - 44 U/L   Alkaline Phosphatase 107 38 - 126 U/L   Total Bilirubin 0.3 <1.2 mg/dL   GFR, Estimated 14 (L) >60 mL/min    Comment: (NOTE) Calculated using the CKD-EPI Creatinine Equation (2021)    Anion gap 5 5 - 15    Comment: Performed at [facility], [address], [place], [place] [address]  Magnesium     Status: Abnormal   Collection Time: 08/11/23  4:19 AM  Result Value Ref Range   Magnesium 1.6 (L) 1.7 - 2.4 mg/dL    Comment: Performed at [facility], [address] [place], [place] [address]  Blood gas, venous     Status: Abnormal   Collection Time: 08/11/23  5:39 AM  Result Value Ref Range   pH, Ven 7.55 (H) 7.25 - 7.43   pCO2, Ven 33 (L) 44 - 60 mmHg   pO2, Ven 132 (H) 32 - 45 mmHg   Bicarbonate 28.9 (H) 20.0 - 28.0 mmol/L   Acid-Base Excess 6.6 (H) 0.0 - 2.0 mmol/L   O2 Saturation 99.9 %   Patient temperature 36.8    Collection site RIGHT HAND    Drawn by Carlean Purl     Comment: Performed at [facility], [address] [place], [place] [address]  Protime-INR     Status: Abnormal   Collection Time: 08/11/23  5:44 AM  Result Value Ref Range   Prothrombin Time >90.0 (H) 11.4 - 15.2 seconds   INR >10.0 (HH) 0.8 - 1.2    Comment: REPEATED TO VERIFY CRITICAL RESULT CALLED TO, READ BACK BY AND VERIFIED WITH: Lew Dawes, RN @0636  08/11/23 SATRAINR (NOTE) INR goal varies based on device and disease states. Performed at [facility], [address], [place], [place] [address]   Vitamin B12     Status: None   Collection Time: 08/11/23  5:45 AM  Result Value Ref Range   Vitamin B-12 424 180 - 914  pg/mL    Comment: (NOTE) This assay is not validated for testing neonatal or myeloproliferative syndrome specimens for Vitamin B12 levels. Performed at [facility], [address], [place], [place] [address]   CBG monitoring, ED     Status: Abnormal   Collection Time: 08/11/23  6:01 AM  Result Value Ref Range   Glucose-Capillary 20 (LL) 70 - 99 mg/dL    Comment: Glucose reference range applies only to samples taken after fasting for at least 8 hours.   Comment 1 Repeat Test   CBG monitoring,  ED     Status: Abnormal   Collection Time: 08/11/23  6:03 AM  Result Value Ref Range   Glucose-Capillary 27 (LL) 70 - 99 mg/dL    Comment: Glucose reference range applies only to samples taken after fasting for at least 8 hours.  CBG monitoring, ED     Status: None   Collection Time: 08/11/23  6:38 AM  Result Value Ref Range   Glucose-Capillary 81 70 - 99 mg/dL    Comment: Glucose reference range applies only to samples taken after fasting for at least 8 hours.  CBG monitoring, ED     Status: Abnormal   Collection Time: 08/11/23  7:29 AM  Result Value Ref Range   Glucose-Capillary 46 (L) 70 - 99 mg/dL    Comment: Glucose reference range applies only to samples taken after fasting for at least 8 hours.  CBG monitoring, ED     Status: None   Collection Time: 08/11/23  8:08 AM  Result Value Ref Range   Glucose-Capillary 79 70 - 99 mg/dL    Comment: Glucose reference range applies only to samples taken after fasting for at least 8 hours.  CBG monitoring, ED     Status: None   Collection Time: 08/11/23  9:17 AM  Result Value Ref Range   Glucose-Capillary 73 70 - 99 mg/dL    Comment: Glucose reference range applies only to samples taken after fasting for at least 8 hours.  Protime-INR     Status: None   Collection Time: 08/11/23  9:30 AM  Result Value Ref Range   Prothrombin Time 12.8 11.4 - 15.2 seconds   INR 1.0 0.8 - 1.2    Comment: (NOTE) INR goal varies based on device and  disease states. Performed at [facility], [address] [place], [place] [address]   CBG monitoring, ED     Status: Abnormal   Collection Time: 08/11/23 10:31 AM  Result Value Ref Range   Glucose-Capillary 45 (L) 70 - 99 mg/dL    Comment: Glucose reference range applies only to samples taken after fasting for at least 8 hours.  CBG monitoring, ED     Status: Abnormal   Collection Time: 08/11/23 12:12 PM  Result Value Ref Range   Glucose-Capillary 57 (L) 70 - 99 mg/dL    Comment: Glucose reference range applies only to samples taken after fasting for at least 8 hours.  CBG monitoring, ED     Status: Abnormal   Collection Time: 08/11/23  1:20 PM  Result Value Ref Range   Glucose-Capillary 39 (LL) 70 - 99 mg/dL    Comment: Glucose reference range applies only to samples taken after fasting for at least 8 hours.  CBG monitoring, ED     Status: Abnormal   Collection Time: 08/11/23  1:57 PM  Result Value Ref Range   Glucose-Capillary 105 (H) 70 - 99 mg/dL    Comment: Glucose reference range applies only to samples taken after fasting for at least 8 hours.  Glucose, capillary     Status: Abnormal   Collection Time: 08/11/23  4:00 PM  Result Value Ref Range   Glucose-Capillary 61 (L) 70 - 99 mg/dL    Comment: Glucose reference range applies only to samples taken after fasting for at least 8 hours.  Glucose, capillary     Status: Abnormal   Collection Time: 08/11/23  4:22 PM  Result Value Ref Range   Glucose-Capillary 118 (H) 70 - 99 mg/dL  Comment: Glucose reference range applies only to samples taken after fasting for at least 8 hours.  Glucose, capillary     Status: None   Collection Time: 08/11/23  6:28 PM  Result Value Ref Range   Glucose-Capillary 89 70 - 99 mg/dL    Comment: Glucose reference range applies only to samples taken after fasting for at least 8 hours.  Glucose, capillary     Status: Abnormal   Collection Time: 08/11/23  6:43 PM  Result Value Ref Range    Glucose-Capillary 111 (H) 70 - 99 mg/dL    Comment: Glucose reference range applies only to samples taken after fasting for at least 8 hours.  Glucose, capillary     Status: Abnormal   Collection Time: 08/12/23 12:03 AM  Result Value Ref Range   Glucose-Capillary 155 (H) 70 - 99 mg/dL    Comment: Glucose reference range applies only to samples taken after fasting for at least 8 hours.   Comment 1 Notify RN    Comment 2 Document in Chart   Glucose, capillary     Status: Abnormal   Collection Time: 08/12/23  3:40 AM  Result Value Ref Range   Glucose-Capillary 124 (H) 70 - 99 mg/dL    Comment: Glucose reference range applies only to samples taken after fasting for at least 8 hours.   Comment 1 Notify RN    Comment 2 Document in Chart   Renal function panel     Status: Abnormal   Collection Time: 08/12/23  5:45 AM  Result Value Ref Range   Sodium 130 (L) 135 - 145 mmol/L   Potassium 3.9 3.5 - 5.1 mmol/L   Chloride 96 (L) 98 - 111 mmol/L   CO2 26 22 - 32 mmol/L   Glucose, Bld 104 (H) 70 - 99 mg/dL    Comment: Glucose reference range applies only to samples taken after fasting for at least 8 hours.   BUN 8 6 - 20 mg/dL   Creatinine, Ser 1.61 (H) 0.44 - 1.00 mg/dL   Calcium 7.5 (L) 8.9 - 10.3 mg/dL   Phosphorus 1.9 (L) 2.5 - 4.6 mg/dL   Albumin 1.5 (L) 3.5 - 5.0 g/dL   GFR, Estimated 16 (L) >60 mL/min    Comment: (NOTE) Calculated using the CKD-EPI Creatinine Equation (2021)    Anion gap 8 5 - 15    Comment: Performed at [facility], [address] [place], [place] [address]  Glucose, capillary     Status: Abnormal   Collection Time: 08/12/23  7:44 AM  Result Value Ref Range   Glucose-Capillary 127 (H) 70 - 99 mg/dL    Comment: Glucose reference range applies only to samples taken after fasting for at least 8 hours.  Glucose, capillary     Status: Abnormal   Collection Time: 08/12/23 10:59 AM  Result Value Ref Range   Glucose-Capillary 179 (H) 70 - 99 mg/dL    Comment:  Glucose reference range applies only to samples taken after fasting for at least 8 hours.  Glucose, capillary     Status: Abnormal   Collection Time: 08/12/23  3:29 PM  Result Value Ref Range   Glucose-Capillary 215 (H) 70 - 99 mg/dL    Comment: Glucose reference range applies only to samples taken after fasting for at least 8 hours.   CT HEAD WO CONTRAST ( ) Result Date: 08/11/2023 CLINICAL DATA:  Mental status change, unknown cause.  Hypoglycemia. EXAM: CT HEAD WITHOUT CONTRAST TECHNIQUE: Contiguous axial  images were obtained from the base of the skull through the vertex without intravenous contrast. RADIATION DOSE REDUCTION: This exam was performed according to the departmental dose-optimization program which includes automated exposure control, adjustment of the mA and/or kV according to patient size and/or use of iterative reconstruction technique. 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 No acute intracranial abnormality. Specifically, no hemorrhage, hydrocephalus, mass lesion, acute infarction, or significant intracranial injury. Vascular: No hyperdense vessel or unexpected calcification. Skull: No acute calvarial abnormality. Sinuses/Orbits: No acute findings Other: None IMPRESSION: No acute intracranial abnormality. Electronically Signed   By: Charlett Nose M.D.   On: 08/11/2023 01:06   DG Chest Port 1 View Result Date: 08/11/2023 CLINICAL DATA:  Weakness. EXAM: PORTABLE CHEST 1 VIEW COMPARISON:  August 04, 2023 FINDINGS: There is stable right-sided venous catheter positioning. The cardiac silhouette is mildly enlarged and unchanged in size. Mild, diffuse, chronic appearing increased interstitial lung markings are seen. Mild superimposed atelectasis and/or infiltrate is seen within the mid left lung and bilateral lung bases. No pleural effusion or pneumothorax is identified. The  visualized skeletal structures are unremarkable. IMPRESSION: Chronic appearing increased interstitial lung markings with mild superimposed mid left lung and bibasilar atelectasis and/or infiltrate. Electronically Signed   By: Aram Candela M.D.   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 OR;  Service: Vascular;  Laterality: Left;   BALLOON DILATION N/A 11/06/2022   Procedure: BALLOON DILATION;  Surgeon: Napoleon Form, MD;  Location: [facility] ENDOSCOPY;  Service: Gastroenterology;  Laterality: N/A;   CARPAL TUNNEL RELEASE     COLONOSCOPY WITH PROPOFOL N/A 11/06/2022   Procedure: COLONOSCOPY WITH PROPOFOL;  Surgeon: Napoleon Form, MD;  Location: [facility] ENDOSCOPY;  Service: Gastroenterology;  Laterality: N/A;   ESOPHAGOGASTRODUODENOSCOPY (EGD) WITH PROPOFOL N/A 11/06/2022   Procedure: ESOPHAGOGASTRODUODENOSCOPY (EGD) WITH PROPOFOL;  Surgeon: Napoleon Form, MD;  Location: [facility] ENDOSCOPY;  Service: Gastroenterology;  Laterality: N/A;   INSERTION OF DIALYSIS CATHETER Right 08/01/2023   Procedure: INSERTION OF TUNNELED DIALYSIS CATHETER;  Surgeon: Daria Pastures, MD;  Location: [facility] OR;  Service: Vascular;  Laterality: Right;   IR FLUORO GUIDE CV LINE RIGHT  01/26/2022   IR US  GUIDE VASC ACCESS RIGHT  01/26/2022   LEFT HEART CATH AND CORONARY ANGIOGRAPHY N/A 12/20/2021   Procedure: LEFT HEART CATH AND CORONARY ANGIOGRAPHY;  Surgeon: Kathleene Hazel, MD;  Location: [facility] INVASIVE CV LAB;  Service: Cardiovascular;  Laterality: N/A;   OPEN REDUCTION INTERNAL FIXATION (ORIF) FINGER WITH RADIAL BONE GRAFT Right 09/12/2015   Procedure: RIGHT MIDDLE FINGER CLOSED REDUCTION AND PINNING;  Surgeon:  Bradly Bienenstock, MD;  Location: [facility] OR;  Service: Orthopedics;  Laterality: Right;   TONSILLECTOMY     TUBAL LIGATION      Allergies:  Allergies  Allergen Reactions   Penicillins Shortness Of Breath and Other (See Comments)    Caused yeast infection Has patient had a PCN reaction causing immediate rash, facial/tongue/throat swelling, SOB or lightheadedness with hypotension: Yes Has patient had a PCN reaction causing severe rash involving mucus membranes or skin necrosis: No Has patient had a PCN reaction that required hospitalization pt was in the hospital at the time of the reaction Has patient had a PCN reaction occurring within the last 10 years: No If all of the above answers are "NO", then may proceed with Cephalosp   Ultram [Tramadol] Other (See Comments)    Made her tongue raw    Medications:   Prior to Admission medications   Medication Sig Start Date End Date Taking? Authorizing Provider  albuterol (PROVENTIL HFA;VENTOLIN HFA) 108 (90 Base) MCG/ACT inhaler Inhale 2 puffs into the lungs every 4 (four) hours as needed for wheezing or shortness of breath.   Yes [provider]  amLODipine (NORVASC) 10 MG tablet Take 1 tablet (10 mg total) by mouth daily. 11/01/22  Yes Milford, Anderson Malta, FNP  aspirin EC 81 MG tablet Take 1 tablet (81 mg total) by mouth daily. Swallow whole. 05/11/22  Yes Quincy Simmonds, MD  atorvastatin (LIPITOR) 80 MG tablet Take 1 tablet (80 mg total) by mouth daily. 03/11/23  Yes Azucena Fallen, MD  budesonide-formoterol [facility]) 160-4.5  MCG/ACT inhaler Inhale 2 puffs into the lungs in the morning and at bedtime. 11/09/21  Yes [provider]  calcitRIOL (ROCALTROL) 0.25 MCG capsule Take 0.25 mcg by mouth daily. 07/22/23  Yes [provider]  cetirizine (ZYRTEC) 10 MG tablet Take 10 mg by mouth daily as needed for allergies.   Yes [provider]  Darbepoetin Alfa (ARANESP) 60 MCG/0.3ML SOSY injection Inject 0.3 mLs (60 mcg total) into the skin every Thursday at 6pm. 08/08/23  Yes Dezii, Alexandra, DO  eszopiclone 3 MG TABS Take 3 mg by mouth at bedtime. Take immediately before bedtime   Yes [provider]  furosemide (LASIX) 40 MG tablet Take 40 mg by mouth daily. 08/09/23  Yes [provider]  gabapentin (NEURONTIN) 100 MG capsule Take 1 capsule (100 mg total) by mouth 3 (three) times daily. Patient taking differently: Take 100 mg by mouth 2 (two) times daily. 03/29/23 08/10/24 Yes Arrien, York Ram, MD  hydrALAZINE (APRESOLINE) 100 MG tablet 100 mg 3 (three) times daily. 07/29/23  Yes [provider]  isosorbide mononitrate (IMDUR) 30 MG 24 hr tablet Take 15 mg by mouth daily.   Yes [provider]  linagliptin (TRADJENTA) 5 MG TABS tablet Take 5 mg by mouth daily.   Yes [provider]  mirtazapine (REMERON) 7.5 MG tablet Take 1 tablet (7.5 mg total) by mouth at bedtime. 11/07/22  Yes Ghimire, Werner Lean, MD  naphazoline-pheniramine (NAPHCON-A) 0.025-0.3 % ophthalmic solution Place 2 drops into both eyes 2 (two) times daily.   Yes [provider]  nitroGLYCERIN (NITROSTAT) 0.4 MG SL tablet Place 1 tablet (0.4 mg total) under the tongue every 5 (five) minutes as needed for chest pain. 05/11/22 11/01/23 Yes Quincy Simmonds, MD  oxyCODONE-acetaminophen (PERCOCET) 10-325 MG tablet Take 1 tablet by mouth every 8 (eight) hours as needed for pain. 07/22/23 08/21/23 Yes [provider]  pantoprazole (PROTONIX) 40 MG tablet Take  1 tablet (40 mg total) by  mouth daily at 12 noon. Patient taking differently: Take 40 mg by mouth at bedtime. 05/11/22  Yes Quincy Simmonds, MD  potassium chloride (KLOR-CON) 10 MEQ tablet Take 10 mEq by mouth daily. 08/09/23  Yes [provider]  torsemide (DEMADEX) 100 MG tablet Take 100 mg by mouth 2 (two) times daily. 08/09/23  Yes [provider]  triamcinolone ointment (KENALOG) 0.1 % 1 Application 2 (two) times daily as needed. 06/29/23  Yes [provider]    Discontinued Meds:   Medications Discontinued During This Encounter  Medication Reason   ondansetron (ZOFRAN) injection 4 mg    dextrose 10 % infusion    dextrose 20 % infusion Duplicate   melatonin 5 MG TABS    linagliptin (TRADJENTA) 5 MG TABS tablet    isosorbide mononitrate (IMDUR) 30 MG 24 hr tablet    hydrALAZINE (APRESOLINE) tablet 100 mg    calcitRIOL (ROCALTROL) capsule 0.25 mcg    pentafluoroprop-tetrafluoroeth (GEBAUERS) aerosol 1 Application Patient Transfer   lidocaine (PF) (XYLOCAINE) 1 % injection 5 mL Patient Transfer   lidocaine-prilocaine (EMLA) cream 1 Application Patient Transfer   feeding supplement (NEPRO CARB STEADY) liquid 237 mL Patient Transfer   heparin injection 1,000 Units Patient Transfer   anticoagulant sodium citrate solution 5 mL Patient Transfer   alteplase (CATHFLO ACTIVASE) injection 2 mg Patient Transfer   heparin injection 1,600 Units Patient Transfer    Social History:  reports that she has been smoking cigarettes. She has a 0.8 pack-year smoking history. She has been exposed to tobacco smoke. She has never used smokeless tobacco. She reports current alcohol use. She reports current drug use. Drug: Cocaine.  Family History:   Family History  Problem Relation Age of Onset   Diabetes Mother    Kidney disease Mother    Seizures Father    Hypertension Father    Stroke Father    Asthma Other     Blood pressure 118/63, pulse 100, temperature 98.4 F (36.9 C), temperature source  Oral, resp. rate 17, height 5\' 4"  (1.626 m), weight 77.1 kg, SpO2 97%. Physical Exam: Gen alert, no distress No rash, cyanosis or gangrene Sclera anicteric, throat clear  No jvd or bruits Chest clear bilat to bases, no rales/ wheezing RRR no MRG Abd soft ntnd no mass or ascites +bs Ext 1+ bilat pretib edema, no wounds or ulcers Neuro is alert, Ox 3 , nf RIJ TDC intact in chest, lt BBF good bruit     Maddalyn Lutze, Len Blalock, MD 08/12/2023, 3:34 PM

## 2023-08-12 NOTE — Telephone Encounter (Signed)
Patient Product/process development scientist completed.    The patient is insured through [facility]. Patient has Medicare and is not eligible for a copay card, but may be able to apply for patient assistance, if available.    Ran test claim for isosorbide-hydralazine (Bidil) 20-37.5 mg and the current 30 day co-pay is $0.00.   This test claim was processed through [facility]- copay amounts may vary at other pharmacies due to pharmacy/plan contracts, or as the patient moves through the different stages of their insurance plan.     Roland Earl, CPHT Pharmacy Technician III Certified Patient Advocate [facility] Patient Advocate Team Direct Number: [phone]  Fax: [phone]

## 2023-08-12 NOTE — Progress Notes (Signed)
Heart Failure Navigator Progress Note  Assessed for Heart & Vascular TOC clinic readiness.  Patient does not meet criteria due to Advanced Heart Failure Team patient of Dr. Bensimhon.   Navigator will sign off at this time.   Lucilia Yanni, BSN, RN Heart Failure Nurse Navigator Secure Chat Only   

## 2023-08-12 NOTE — Progress Notes (Signed)
PROGRESS NOTE Carol [name]  ZOX:[id number] DOB: May 28, 1961 DOA: 08/10/2023 PCP: Gean Birchwood, [facility]  Brief Narrative/Hospital Course: 62yo with h/o ESRD on MWF HD, DM, HTN, and COPD who presented on 12/21 with symptomatic hypoglycemia. Patient was confused/somnolent at home and glucose was in the 40s. Her only home medication is Tradjenta and her A1c was 4.7 on 12/15. BP markedly elevated.  Seen by nephrology for dialysis, patient was treated with IV dextrose for persistent hypoglycemia, recent A1c was 4.7, hyperglycemia failed to be due to Tradjenta monotherapy with low medical literacy      Subjective: Patient seen examined this morning Her boyfriend at the bedside he thinks she is at her baseline Blood sugar has been stable without hypoglycemia but running D20 50 cc/hr.   Assessment and Plan: Principal Problem:   Hypoglycemia Active Problems:   End stage renal disease (HCC)   DM2 (diabetes mellitus, type 2) (HCC)   Essential hypertension   borderline Prolonged QT interval   Acute metabolic encephalopathy   Hypomagnesemia   Hypokalemia   History of COPD   History of anemia due to chronic kidney disease   Symptomatic hypoglycemia persistent: Due to Tradjenta-has been discontinued, recent A1c 5.7 no need for OHA.  Needing d10 and was changed to D20. Extensive counseling done.  Allow liberal diet with fluid restriction.  Monitor cbg closely and wean down on D20 TODAY-RN informed. Continue on education  Recent Labs  Lab 08/11/23 1828 08/11/23 1843 08/12/23 0003 08/12/23 0340 08/12/23 0744  GLUCAP 89 111* 155* 124* 127*    Acute metabolic encephalopathy: Likely associated with prolonged/recurrent hypoglycemia. Anticipate spontaneous improvement with correction of hypoglycemia.  Her boyfriend at the bedside she thinks she is at baseline mental status communicating well. Cont gabapentin caution with psychotropic medication.  Uncontrolled hypertension poorly controlled  at baseline: Meds adjusted continue amlodipine 10, BiDil.  Continue as needed  HLD: Continue statin  Abnormal chest x-ray-multifocal airspace abnormalities Afebrile Pro-Cal 0.1.  Asymptomatic follow-up outpatient  ESRD on HD MTTF 4X WKLY Anemia of ESRD MBD of CKD: Continue dialysis per schedule per nephrology, hemoglobin clinically stable 8 to 9 g at baseline and monitor, continue to monitor her electrolytes, defer binders to nephrology. Recent Labs    07/30/23 2236 07/31/23 1136 08/01/23 0234 08/02/23 0301 08/03/23 0230 08/04/23 0251 08/05/23 0248 08/11/23 0003 08/11/23 0419 08/12/23 0545  BUN 46* 41* 45* 33* 23* 13 18 13 15 8   CREATININE 7.14* 7.10* 7.49* 5.57* 4.44* 3.70* 4.64* 3.55* 3.66* 3.15*  CO2 18* 17* 19* 22 24 30 27 26 24 26   K 4.1 3.4* 3.8 3.5 3.7 3.4* 3.7 3.4* 3.3* 3.9    QTc prolongation: Monitor avoid QT prolonging medication.  Remeron held.   COPD Continue her Dulera albuterol and Zyrtec  GERD Continue PPI  Chronic pain: Taking medication from 2 providers caution with meds, also on Lunesta at bedtime  Medication misadventure: She acknowledges low overall literacy (6th grade education, does not read well and also has chronic visual impairment) Also with low medical literacy She would benefit from receiving her medications in blister packs for ease in dosing - I have reached out to our [facility] team to see if this is possible or whether she needs to obtain medications elsewhere (she goes to [facility] - closed today so I was unable to ask them if they offer this service) Would attempt to simply medication regimen as much as possible  DVT prophylaxis: SCDs Start: 08/11/23 0359 Code Status:   Code Status: Full  Code Family Communication: plan of care discussed with patient/boyfriend at bedside. Patient status is: Remains hospitalized because of severity of illness Level of care: Progressive   Dispo: The patient is from: Home lives independently             Anticipated disposition: Anticipate discharge in 24 hours if able to wean off the dextrose  iv  Objective: Vitals last 24 hrs: Vitals:   08/12/23 0339 08/12/23 0717 08/12/23 0744 08/12/23 1009  BP: 95/75  120/70   Pulse: 93  94   Resp: 18  17   Temp: 98.1 F (36.7 C)  98 F (36.7 C)   TempSrc: Oral  Oral   SpO2: 98%  98%   Weight:  80.4 kg  77.1 kg  Height:    5\' 4"  (1.626 m)   Weight change:   Physical Examination: General exam: alert awake,at baseline, older than stated age HEENT:Oral mucosa moist, Ear/Nose WNL grossly Respiratory system: Bilaterally clear BS,no use of accessory muscle Cardiovascular system: S1 & S2 +, No JVD. Gastrointestinal system: Abdomen soft,NT,ND, BS+ Nervous System: Alert, awake, moving all extremities,and following commands. Extremities: LE edema neg,distal peripheral pulses palpable and warm.  Skin: No rashes,no icterus. MSK: Normal muscle bulk,tone, power  Right chest with HD catheter with dressing I/c/d  Medications reviewed:  Scheduled Meds:  amLODipine  10 mg Oral Daily   aspirin EC  81 mg Oral Daily   atorvastatin  80 mg Oral Daily   Chlorhexidine Gluconate Cloth  6 each Topical Q0600   gabapentin  100 mg Oral BID   isosorbide-hydrALAZINE  2 tablet Oral TID   loratadine  10 mg Oral Daily   mometasone-formoterol  2 puff Inhalation BID   pantoprazole  40 mg Oral Q1200   potassium & sodium phosphates  1 packet Oral TID WC & HS   Continuous Infusions:  dextrose 50 mL/hr at 08/12/23 0605    Diet Order             Diet regular Room service appropriate? Yes; Fluid consistency: Thin  Diet effective now                   Intake/Output Summary (Last 24 hours) at 08/12/2023 1043 Last data filed at 08/12/2023 0600 Gross per 24 hour  Intake 833.07 ml  Output 3600 ml  Net -2766.93 ml   Unresulted Labs (From admission, onward)     Start     Ordered   08/12/23 0500  Renal function panel  Daily,   R      08/11/23 1332    08/11/23 0422  Rapid urine drug screen (hospital performed)  Add-on,   AD        08/11/23 0421          Data Reviewed: I have personally reviewed following labs and imaging studies CBC: Recent Labs  Lab 08/11/23 0003 08/11/23 0419  WBC 7.8 7.3  NEUTROABS 6.4 5.1  HGB 9.4* 8.1*  HCT 30.2* 26.0*  MCV 91.5 90.6  PLT 247 214   Basic Metabolic Panel:  Recent Labs  Lab 08/11/23 0003 08/11/23 0157 08/11/23 0419 08/12/23 0545  NA 135  --  134* 130*  K 3.4*  --  3.3* 3.9  CL 100  --  105 96*  CO2 26  --  24 26  GLUCOSE 74  --  110* 104*  BUN 13  --  15 8  CREATININE 3.55*  --  3.66* 3.15*  CALCIUM 8.3*  --  7.7* 7.5*  MG 1.6*  --  1.6*  --   PHOS  --  1.6*  --  1.9*   GFR: Estimated Creatinine Clearance: 19.1 mL/min (A) (by C-G formula based on SCr of 3.15 mg/dL (H)). Liver Function Tests:  Recent Labs  Lab 08/11/23 0003 08/11/23 0419 08/12/23 0545  AST 29 26  --   ALT 10 11  --   ALKPHOS 117 107  --   BILITOT 0.4 0.3  --   PROT 6.5 5.3*  --   ALBUMIN 2.0* 1.7* 1.5*  No results for input(s): "LIPASE", "AMYLASE" in the last 168 hours. No results for input(s): "AMMONIA" in the last 168 hours. Coagulation Profile:  Recent Labs  Lab 08/11/23 0544 08/11/23 0930  INR >10.0* 1.0  No results for input(s): "PROBNP" in the last 168 hours.  No results for input(s): "HGBA1C" in the last 72 hours. Recent Labs  Lab 08/11/23 1828 08/11/23 1843 08/12/23 0003 08/12/23 0340 08/12/23 0744  GLUCAP 89 111* 155* 124* 127*  No results for input(s): "CHOL", "HDL", "LDLCALC", "TRIG", "CHOLHDL", "LDLDIRECT" in the last 72 hours. Recent Labs    08/11/23 0003  TSH 0.878  Sepsis Labs: Recent Labs  Lab 08/11/23 0157  PROCALCITON 0.12   No results found for this or any previous visit (from the past 240 hours).  Antimicrobials/Microbiology: Anti-infectives (From admission, onward)    None         Component Value Date/Time   SDES URINE, CATHETERIZED 07/03/2011 2203    SPECREQUEST NONE 07/03/2011 2203   CULT  07/03/2011 2203    GROUP B STREP(S.AGALACTIAE)ISOLATED Note: TESTING AGAINST S. AGALACTIAE NOT ROUTINELY PERFORMED DUE TO PREDICTABILITY OF AMP/PEN/VAN SUSCEPTIBILITY.   REPTSTATUS 07/05/2011 FINAL 07/03/2011 2203     Radiology Studies: CT HEAD WO CONTRAST ( ) Result Date: 08/11/2023 CLINICAL DATA:  Mental status change, unknown cause.  Hypoglycemia. EXAM: CT HEAD WITHOUT CONTRAST TECHNIQUE: Contiguous axial images were obtained from the base of the skull through the vertex without intravenous contrast. RADIATION DOSE REDUCTION: This exam was performed according to the departmental dose-optimization program which includes automated exposure control, adjustment of the mA and/or kV according to patient size and/or use of iterative reconstruction technique. 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 No acute intracranial abnormality. Specifically, no hemorrhage, hydrocephalus, mass lesion, acute infarction, or significant intracranial injury. Vascular: No hyperdense vessel or unexpected calcification. Skull: No acute calvarial abnormality. Sinuses/Orbits: No acute findings Other: None IMPRESSION: No acute intracranial abnormality. Electronically Signed   By: Charlett Nose M.D.   On: 08/11/2023 01:06   DG Chest Port 1 View Result Date: 08/11/2023 CLINICAL DATA:  Weakness. EXAM: PORTABLE CHEST 1 VIEW COMPARISON:  August 04, 2023 FINDINGS: There is stable right-sided venous catheter positioning. The cardiac silhouette is mildly enlarged and unchanged in size. Mild, diffuse, chronic appearing increased interstitial lung markings are seen. Mild superimposed atelectasis and/or infiltrate is seen within the mid left lung and bilateral lung bases. No pleural effusion or pneumothorax is identified. The visualized skeletal structures are unremarkable. IMPRESSION:  Chronic appearing increased interstitial lung markings with mild superimposed mid left lung and bibasilar atelectasis and/or infiltrate. Electronically Signed   By: Aram Candela M.D.   On: 08/11/2023 00:06    LOS: 1 day  Total time spent in review of labs and imaging, patient evaluation, formulation of plan, documentation and communication with family:  35 minutes  Lanae Boast, MD [facility]  08/12/2023, 10:43 AM

## 2023-08-13 DIAGNOSIS — E162 Hypoglycemia, unspecified: Secondary | ICD-10-CM | POA: Diagnosis not present

## 2023-08-13 LAB — RENAL FUNCTION PANEL
Albumin: 1.5 g/dL — ABNORMAL LOW (ref 3.5–5.0)
Anion gap: 8 (ref 5–15)
BUN: 16 mg/dL (ref 6–20)
CO2: 25 mmol/L (ref 22–32)
Calcium: 7.4 mg/dL — ABNORMAL LOW (ref 8.9–10.3)
Chloride: 94 mmol/L — ABNORMAL LOW (ref 98–111)
Creatinine, Ser: 4.29 mg/dL — ABNORMAL HIGH (ref 0.44–1.00)
GFR, Estimated: 11 mL/min — ABNORMAL LOW (ref >60)
Glucose, Bld: 99 mg/dL (ref 70–99)
Phosphorus: 2.6 mg/dL (ref 2.5–4.6)
Potassium: 4.8 mmol/L (ref 3.5–5.1)
Sodium: 127 mmol/L — ABNORMAL LOW (ref 135–145)

## 2023-08-13 LAB — GLUCOSE, CAPILLARY
Glucose-Capillary: 106 mg/dL — ABNORMAL HIGH (ref 70–99)
Glucose-Capillary: 130 mg/dL — ABNORMAL HIGH (ref 70–99)
Glucose-Capillary: 144 mg/dL — ABNORMAL HIGH (ref 70–99)
Glucose-Capillary: 158 mg/dL — ABNORMAL HIGH (ref 70–99)
Glucose-Capillary: 162 mg/dL — ABNORMAL HIGH (ref 70–99)
Glucose-Capillary: 198 mg/dL — ABNORMAL HIGH (ref 70–99)
Glucose-Capillary: 98 mg/dL (ref 70–99)

## 2023-08-13 LAB — CBC
HCT: 23.3 % — ABNORMAL LOW (ref 36.0–46.0)
Hemoglobin: 7.4 g/dL — ABNORMAL LOW (ref 12.0–15.0)
MCH: 29.5 pg (ref 26.0–34.0)
MCHC: 31.8 g/dL (ref 30.0–36.0)
MCV: 92.8 fL (ref 80.0–100.0)
Platelets: 214 10*3/uL (ref 150–400)
RBC: 2.51 MIL/uL — ABNORMAL LOW (ref 3.87–5.11)
RDW: 15.6 % — ABNORMAL HIGH (ref 11.5–15.5)
WBC: 9.3 10*3/uL (ref 4.0–10.5)
nRBC: 0 % (ref 0.0–0.2)

## 2023-08-13 MED ORDER — HEPARIN SODIUM (PORCINE) 1000 UNIT/ML IJ SOLN
INTRAMUSCULAR | Status: AC
  Administered 2023-08-13: 3200 [IU]
  Filled 2023-08-13: qty 4

## 2023-08-13 NOTE — Plan of Care (Signed)
No acute change. BG stable, HD today.   Problem: Education: Goal: Knowledge of General Education information will improve Description: Including pain rating scale, medication(s)/side effects and non-pharmacologic comfort measures Outcome: Progressing   Problem: Nutrition: Goal: Adequate nutrition will be maintained Outcome: Progressing   Problem: Elimination: Goal: Will not experience complications related to bowel motility Outcome: Progressing Goal: Will not experience complications related to urinary retention Outcome: Progressing   Problem: Pain Management: Goal: General experience of comfort will improve Outcome: Progressing   Problem: Safety: Goal: Ability to remain free from injury will improve Outcome: Progressing   Problem: Skin Integrity: Goal: Risk for impaired skin integrity will decrease Outcome: Progressing

## 2023-08-13 NOTE — Procedures (Signed)
 HD Note:  Some information was entered later than the data was gathered due to patient care needs. The stated time with the data is accurate.  Received patient in bed to unit.   Alert and oriented.   Informed consent signed and in chart.   Access used: Right upper chest HD catheter Access issues: None  Patient tolerated treatment well.   TX duration: 3.5 hours  Alert, without acute distress.  Total UF removed: 2000 ml  Hand-off given to patient's nurse.   Transported back to the room   Mari Battaglia L. Dareen Piano, RN Kidney Dialysis Unit.

## 2023-08-13 NOTE — Progress Notes (Signed)
PROGRESS NOTE Carol [name]  ZOX:[id number] DOB: [birth year] DOA: 08/10/2023 PCP: Gean Birchwood, [facility]  Brief Narrative/Hospital Course: 62yo with h/o ESRD on MWF HD, DM, HTN, and COPD who presented on 12/21 with symptomatic hypoglycemia. Patient was confused/somnolent at home and glucose was in the 40s. Her only home medication is Tradjenta and her A1c was 4.7 on 12/15. BP markedly elevated.  Seen by nephrology for dialysis, patient was treated with IV dextrose for persistent hypoglycemia, recent A1c was 4.7, hyperglycemia failed to be due to Tradjenta monotherapy with low medical literacy    Subjective: Patient seen and examined getting HD alert awake at baseline  On D20 at low rate-stopped in HD   Assessment and Plan: Principal Problem:   Hypoglycemia Active Problems:   End stage renal disease (HCC)   DM2 (diabetes mellitus, type 2) (HCC)   Essential hypertension   borderline Prolonged QT interval   Acute metabolic encephalopathy   Hypomagnesemia   Hypokalemia   History of COPD   History of anemia due to chronic kidney disease   Symptomatic hypoglycemia persistent: Due to Tradjenta, per nephrology it is not dialyzable.  It has been discontinued.  Should not be on any DM medication on discharge as her recent A1c 5.7. Was no D10 and changed to D20- stopped this am-continue to monitor CBC closely. Extensive counseling done.  Allow liberal carb diet with fluid restriction. Recent Labs  Lab 08/12/23 1529 08/12/23 2022 08/13/23 0007 08/13/23 0422 08/13/23 0750  GLUCAP 215* 175* 144* 106* 130*    Acute metabolic encephalopathy: Likely associated with prolonged/recurrent hypoglycemia. Anticipate spontaneous improvement with correction of hypoglycemia.  Her boyfriend at the bedside 12/23 reported she is back to baseline.  Cont gabapentin caution with psychotropic medication.  Essential hypertension : BP has been poorly controlled, meds adjusted continue amlodipine  BiDil.   Continue as needed  HLD: Continue statin  Abnormal chest x-ray-multifocal airspace abnormalities Afebrile Pro-Cal 0.1.  Asymptomatic follow-up outpatient  ESRD on HD MTTF 4X WKLY Anemia of ESRD MBD of CKD: Continue dialysis per schedule per nephrology, hemoglobin clinically stable 8 to 9 g at baseline and monitor, continue to monitor her electrolytes, defer binders to nephrology. Recent Labs    07/31/23 1136 08/01/23 0234 08/02/23 0301 08/03/23 0230 08/04/23 0251 08/05/23 0248 08/11/23 0003 08/11/23 0419 08/12/23 0545 08/13/23 0508  BUN 41* 45* 33* 23* 13 18 13 15 8 16   CREATININE 7.10* 7.49* 5.57* 4.44* 3.70* 4.64* 3.55* 3.66* 3.15* 4.29*  CO2 17* 19* 22 24 30 27 26 24 26 25   K 3.4* 3.8 3.5 3.7 3.4* 3.7 3.4* 3.3* 3.9 4.8    QTc prolongation: Monitor avoid QT prolonging medication.  Remeron held.   COPD Continue her Dulera albuterol and Zyrtec  GERD Continue PPI  Chronic pain: Taking medication from 2 providers caution with meds, also on Lunesta at bedtime  Medication misadventure: She acknowledges low overall literacy (6th grade education, does not read well and also has chronic visual impairment) Also with low medical literacy She would benefit from receiving her medications in blister packs for ease in dosing - I have reached out to our [facility] team to see if this is possible or whether she needs to obtain medications elsewhere (she goes to [facility] - closed today so I was unable to ask them if they offer this service) Would attempt to simply medication regimen as much as possible  DVT prophylaxis: SCDs Start: 08/11/23 0359 Code Status:   Code Status: Full Code Family Communication: plan  of care discussed with patient/boyfriend at bedside. Patient status is: Remains hospitalized because of severity of illness Level of care: Progressive   Dispo: The patient is from: Home lives independently            Anticipated disposition: Anticipate discharge in 24 hours  if CBG stable off Dextrose ivf  Objective: Vitals last 24 hrs: Vitals:   08/13/23 0900 08/13/23 0930 08/13/23 1000 08/13/23 1030  BP: (!) 148/78 (!) 154/78  (!) 171/91  Pulse: 91 89 91 79  Resp: 20 17 18 20   Temp:      TempSrc:      SpO2: 96% 97% 98% 100%  Weight:      Height:       Weight change:   Physical Examination: General exam: alert awake, oriented  HEENT:Oral mucosa moist, Ear/Nose WNL grossly Respiratory system: Bilaterally clear BS,no use of accessory muscle, HD catheter in the right chest. Cardiovascular system: S1 & S2 +, No JVD. Gastrointestinal system: Abdomen soft,NT,ND, BS+ Nervous System: Alert, awake, moving all extremities,and following commands. Extremities: LE edema neg,distal peripheral pulses palpable and warm.  Skin: No rashes,no icterus. MSK: Normal muscle bulk,tone, power   Medications reviewed:  Scheduled Meds:  amLODipine  10 mg Oral Daily   aspirin EC  81 mg Oral Daily   atorvastatin  80 mg Oral Daily   Chlorhexidine Gluconate Cloth  6 each Topical Q0600   gabapentin  100 mg Oral BID   isosorbide-hydrALAZINE  2 tablet Oral TID   loratadine  10 mg Oral Daily   mometasone-formoterol  2 puff Inhalation BID   pantoprazole  40 mg Oral Q1200  Continuous Infusions:   Diet Order             Diet regular Room service appropriate? Yes; Fluid consistency: Thin  Diet effective now                   Intake/Output Summary (Last 24 hours) at 08/13/2023 1041 Last data filed at 08/12/2023 1109 Gross per 24 hour  Intake 242.89 ml  Output --  Net 242.89 ml   Unresulted Labs (From admission, onward)     Start     Ordered   08/12/23 1541  Hepatitis B surface antibody,quantitative  (New Admission Hemo Labs (Hepatitis B))  Once,   R       Question:  Specimen collection method  Answer:  Lab=Lab collect   08/12/23 1541   08/12/23 0500  Renal function panel  Daily,   R      08/11/23 1332   08/11/23 0422  Rapid urine drug screen (hospital  performed)  Add-on,   AD        08/11/23 0421          Data Reviewed: I have personally reviewed following labs and imaging studies CBC: Recent Labs  Lab 08/11/23 0003 08/11/23 0419 08/13/23 0820  WBC 7.8 7.3 9.3  NEUTROABS 6.4 5.1  --   HGB 9.4* 8.1* 7.4*  HCT 30.2* 26.0* 23.3*  MCV 91.5 90.6 92.8  PLT 247 214 214   Basic Metabolic Panel:  Recent Labs  Lab 08/11/23 0003 08/11/23 0157 08/11/23 0419 08/12/23 0545 08/13/23 0508  NA 135  --  134* 130* 127*  K 3.4*  --  3.3* 3.9 4.8  CL 100  --  105 96* 94*  CO2 26  --  24 26 25   GLUCOSE 74  --  110* 104* 99  BUN 13  --  15 8  16  CREATININE 3.55*  --  3.66* 3.15* 4.29*  CALCIUM 8.3*  --  7.7* 7.5* 7.4*  MG 1.6*  --  1.6*  --   --   PHOS  --  1.6*  --  1.9* 2.6   GFR: Estimated Creatinine Clearance: 14.4 mL/min (A) (by C-G formula based on SCr of 4.29 mg/dL (H)). Liver Function Tests:  Recent Labs  Lab 08/11/23 0003 08/11/23 0419 08/12/23 0545 08/13/23 0508  AST 29 26  --   --   ALT 10 11  --   --   ALKPHOS 117 107  --   --   BILITOT 0.4 0.3  --   --   PROT 6.5 5.3*  --   --   ALBUMIN 2.0* 1.7* 1.5* 1.5*  No results for input(s): "LIPASE", "AMYLASE" in the last 168 hours. No results for input(s): "AMMONIA" in the last 168 hours. Coagulation Profile:  Recent Labs  Lab 08/11/23 0544 08/11/23 0930  INR >10.0* 1.0  No results for input(s): "PROBNP" in the last 168 hours.  No results for input(s): "HGBA1C" in the last 72 hours. Recent Labs  Lab 08/12/23 1529 08/12/23 2022 08/13/23 0007 08/13/23 0422 08/13/23 0750  GLUCAP 215* 175* 144* 106* 130*  No results for input(s): "CHOL", "HDL", "LDLCALC", "TRIG", "CHOLHDL", "LDLDIRECT" in the last 72 hours. Recent Labs    08/11/23 0003  TSH 0.878  Sepsis Labs: Recent Labs  Lab 08/11/23 0157  PROCALCITON 0.12   No results found for this or any previous visit (from the past 240 hours).  Antimicrobials/Microbiology: Anti-infectives (From admission, onward)     None         Component Value Date/Time   SDES URINE, CATHETERIZED 07/03/2011 2203   SPECREQUEST NONE 07/03/2011 2203   CULT  07/03/2011 2203    GROUP B STREP(S.AGALACTIAE)ISOLATED Note: TESTING AGAINST S. AGALACTIAE NOT ROUTINELY PERFORMED DUE TO PREDICTABILITY OF AMP/PEN/VAN SUSCEPTIBILITY.   REPTSTATUS 07/05/2011 FINAL 07/03/2011 2203     Radiology Studies: No results found.   LOS: 2 days  Total time spent in review of labs and imaging, patient evaluation, formulation of plan, documentation and communication with family:  35 minutes  Lanae Boast, MD [facility]  08/13/2023, 10:41 AM

## 2023-08-13 NOTE — Progress Notes (Signed)
Julieonna Burianek is an 62 y.o. female w/ DM, HTN, CASHD, Parkinson's, ESRD presented to ED w/ c/o hypoglycemia. Family noticed pt was somnolent and a bit confused earlier yest evening, and her BS was in the 40s. She did eat and improved shortly but then got worse again.  Takes tradjenta as her only DM medication. Last A1c was 4.7% on 12/15. Has not missed HD sessions (MWF).   Just started D on 12/12 and was dc'd home on 12/16.    Renal-related home meds: - norvasc 10, hydralazine 100 tid, imdur 15 - lasix 40 daily, demadex 100 bid - gabapentin 100 tid - klorcon 10 qd    OP HD: [facility] 4d/wk (usually MTuThF).  - 3h   400/1.5  3K/2.5Ca bath  78kg  TDC/ 1st BB AVF 08/01/23 (maturing)  Hep 2000 - mircera 100 mcg q 2, start 2/19 - rocaltrol 0.5 mcg q hd  Assessment/Plan: Hypoglycemia - consuming cake/ soda; feels better now. Trandjenta (linagliptin) is not thought to be dialyzable.  ESKD - on HD 4d/wk at the TCU.  Seen on dialysis 2K bath, 2 L net UF goal, 154/85 with dialysis through a right internal jugular tunneled catheter.  Plan HD here today on holiday schedule, and cont MWF holiday schedule while here this week (=sun-tues-Friday).  HTN - BP's high on arrival in 200s, now 116-150's systolic on avg. Cont home meds.  Volume - exam shows mild LE edema, clear lungs. UF goal 2.5- 3 L as tol. CXR I think is overpenetrated. On RA.  Anemia of eskd - Hb 8- 9 here. Get OP records.  MBD ckd - CCa in range, phos is very low at 1.6- pharm recommending oral phos NAK, will give tid for 2 days. Check RFP daily x 3 while here. Hold any binders.  DM2 COPD  Subjective: Hypoglycemic on dialysis previously ; tolerating dialysis today without any complications   Denies fever chills.   Chemistry and CBC: Creat  Date/Time Value Ref Range Status  04/03/2023 04:05 PM 4.87 (H) 0.50 - 1.05 mg/dL Final  16/05/9603 54:09 AM 3.54 (H) 0.50 - 1.05 mg/dL Final  81/19/1478 29:56 PM 1.36 (H) 0.50 - 1.05 mg/dL  Final    Comment:    For patients >98 years of age, the reference limit for Creatinine is approximately 13% higher for people identified as African-American. .    Creatinine, Ser  Date/Time Value Ref Range Status  08/13/2023 05:08 AM 4.29 (H) 0.44 - 1.00 mg/dL Final  21/30/8657 84:69 AM 3.15 (H) 0.44 - 1.00 mg/dL Final  62/95/2841 32:44 AM 3.66 (H) 0.44 - 1.00 mg/dL Final  08/22/7251 66:44 AM 3.55 (H) 0.44 - 1.00 mg/dL Final  03/47/4259 56:38 AM 4.64 (H) 0.44 - 1.00 mg/dL Final  75/64/3329 51:88 AM 3.70 (H) 0.44 - 1.00 mg/dL Final  41/66/0630 16:01 AM 4.44 (H) 0.44 - 1.00 mg/dL Final  09/32/3557 32:20 AM 5.57 (H) 0.44 - 1.00 mg/dL Final  25/42/7062 37:62 AM 7.49 (H) 0.44 - 1.00 mg/dL Final  83/15/1761 60:73 AM 7.10 (H) 0.44 - 1.00 mg/dL Final  71/01/2693 85:46 PM 7.14 (H) 0.44 - 1.00 mg/dL Final  27/10/5007 38:18 AM 4.22 (H) 0.44 - 1.00 mg/dL Final  29/93/7169 67:89 AM 4.14 (H) 0.44 - 1.00 mg/dL Final  38/05/1750 02:58 AM 4.10 (H) 0.44 - 1.00 mg/dL Final  52/77/8242 35:36 AM 4.27 (H) 0.44 - 1.00 mg/dL Final  14/43/1540 08:67 AM 4.23 (H) 0.44 - 1.00 mg/dL Final  61/95/0932 67:12 AM 4.32 (H) 0.44 -  1.00 mg/dL Final  01/60/1093 23:55 PM 4.25 (H) 0.44 - 1.00 mg/dL Final  73/22/0254 27:06 AM 4.22 (H) 0.44 - 1.00 mg/dL Final  23/76/2831 51:76 AM 4.00 (H) 0.44 - 1.00 mg/dL Final  16/02/3709 62:69 AM 4.03 (H) 0.44 - 1.00 mg/dL Final  48/54/6270 35:00 AM 3.85 (H) 0.44 - 1.00 mg/dL Final  93/81/8299 37:16 AM 3.36 (H) 0.44 - 1.00 mg/dL Final  96/78/9381 01:75 PM 3.48 (H) 0.44 - 1.00 mg/dL Final  06/13/8526 78:24 PM 3.49 (H) 0.44 - 1.00 mg/dL Final  23/53/6144 31:54 PM 4.61 (H) 0.44 - 1.00 mg/dL Final  00/86/7619 50:93 AM 3.25 (H) 0.44 - 1.00 mg/dL Final  26/71/2458 09:98 AM 3.56 (H) 0.44 - 1.00 mg/dL Final  33/82/5053 97:67 AM 3.67 (H) 0.44 - 1.00 mg/dL Final  34/19/3790 24:09 AM 3.74 (H) 0.44 - 1.00 mg/dL Final  73/53/2992 42:68 AM 3.38 (H) 0.44 - 1.00 mg/dL Final  34/19/6222 97:98 AM  2.76 (H) 0.44 - 1.00 mg/dL Final  92/06/9416 40:81 AM 2.85 (H) 0.44 - 1.00 mg/dL Final  44/81/8563 14:97 AM 2.62 (H) 0.44 - 1.00 mg/dL Final  02/63/7858 85:02 PM 3.05 (H) 0.44 - 1.00 mg/dL Final  77/41/2878 67:67 AM 3.10 (H) 0.44 - 1.00 mg/dL Final  20/94/7096 28:36 PM 2.96 (H) 0.44 - 1.00 mg/dL Final  62/94/7654 65:03 AM 3.02 (H) 0.44 - 1.00 mg/dL Final  54/65/6812 75:17 AM 3.11 (H) 0.44 - 1.00 mg/dL Final  00/17/4944 96:75 AM 2.32 (H) 0.44 - 1.00 mg/dL Final  91/63/8466 59:93 AM 2.33 (H) 0.44 - 1.00 mg/dL Final  57/08/7791 90:30 PM 3.48 (H) 0.44 - 1.00 mg/dL Final  05/12/3006 62:26 PM 2.05 (H) 0.44 - 1.00 mg/dL Final  33/35/4562 56:38 AM 2.46 (H) 0.44 - 1.00 mg/dL Final  93/73/4287 68:11 AM 2.64 (H) 0.44 - 1.00 mg/dL Final  57/26/2035 59:74 AM 2.97 (H) 0.44 - 1.00 mg/dL Final  16/38/4536 46:80 AM 2.72 (H) 0.44 - 1.00 mg/dL Final  32/07/2481 50:03 AM 2.55 (H) 0.44 - 1.00 mg/dL Final  70/48/8891 69:45 AM 2.67 (H) 0.44 - 1.00 mg/dL Final  03/88/8280 03:49 AM 3.22 (H) 0.44 - 1.00 mg/dL Final  17/91/5056 97:94 AM 2.96 (H) 0.44 - 1.00 mg/dL Final  80/16/5537 48:27 AM 2.66 (H) 0.44 - 1.00 mg/dL Final   Recent Labs  Lab 08/11/23 0003 08/11/23 0157 08/11/23 0419 08/12/23 0545 08/13/23 0508  NA 135  --  134* 130* 127*  K 3.4*  --  3.3* 3.9 4.8  CL 100  --  105 96* 94*  CO2 26  --  24 26 25   GLUCOSE 74  --  110* 104* 99  BUN 13  --  15 8 16   CREATININE 3.55*  --  3.66* 3.15* 4.29*  CALCIUM 8.3*  --  7.7* 7.5* 7.4*  PHOS  --  1.6*  --  1.9* 2.6   Recent Labs  Lab 08/11/23 0003 08/11/23 0419 08/13/23 0820  WBC 7.8 7.3 9.3  NEUTROABS 6.4 5.1  --   HGB 9.4* 8.1* 7.4*  HCT 30.2* 26.0* 23.3*  MCV 91.5 90.6 92.8  PLT 247 214 214   Liver Function Tests: Recent Labs  Lab 08/11/23 0003 08/11/23 0419 08/12/23 0545 08/13/23 0508  AST 29 26  --   --   ALT 10 11  --   --   ALKPHOS 117 107  --   --   BILITOT 0.4 0.3  --   --   PROT 6.5 5.3*  --   --  ALBUMIN 2.0* 1.7* 1.5*  1.5*   No results for input(s): "LIPASE", "AMYLASE" in the last 168 hours. No results for input(s): "AMMONIA" in the last 168 hours. Cardiac Enzymes: No results for input(s): "CKTOTAL", "CKMB", "CKMBINDEX", "TROPONINI" in the last 168 hours. Iron Studies: No results for input(s): "IRON", "TIBC", "TRANSFERRIN", "FERRITIN" in the last 72 hours. PT/INR: @LABRCNTIP (inr:5)  Xrays/Other Studies: ) Results for orders placed or performed during the hospital encounter of 08/10/23 (from the past 48 hours)  CBG monitoring, ED     Status: Abnormal   Collection Time: 08/11/23 10:31 AM  Result Value Ref Range   Glucose-Capillary 45 (L) 70 - 99 mg/dL    Comment: Glucose reference range applies only to samples taken after fasting for at least 8 hours.  CBG monitoring, ED     Status: Abnormal   Collection Time: 08/11/23 12:12 PM  Result Value Ref Range   Glucose-Capillary 57 (L) 70 - 99 mg/dL    Comment: Glucose reference range applies only to samples taken after fasting for at least 8 hours.  CBG monitoring, ED     Status: Abnormal   Collection Time: 08/11/23  1:20 PM  Result Value Ref Range   Glucose-Capillary 39 (LL) 70 - 99 mg/dL    Comment: Glucose reference range applies only to samples taken after fasting for at least 8 hours.  CBG monitoring, ED     Status: Abnormal   Collection Time: 08/11/23  1:57 PM  Result Value Ref Range   Glucose-Capillary 105 (H) 70 - 99 mg/dL    Comment: Glucose reference range applies only to samples taken after fasting for at least 8 hours.  Glucose, capillary     Status: Abnormal   Collection Time: 08/11/23  4:00 PM  Result Value Ref Range   Glucose-Capillary 61 (L) 70 - 99 mg/dL    Comment: Glucose reference range applies only to samples taken after fasting for at least 8 hours.  Glucose, capillary     Status: Abnormal   Collection Time: 08/11/23  4:22 PM  Result Value Ref Range   Glucose-Capillary 118 (H) 70 - 99 mg/dL    Comment: Glucose reference range  applies only to samples taken after fasting for at least 8 hours.  Glucose, capillary     Status: None   Collection Time: 08/11/23  6:28 PM  Result Value Ref Range   Glucose-Capillary 89 70 - 99 mg/dL    Comment: Glucose reference range applies only to samples taken after fasting for at least 8 hours.  Glucose, capillary     Status: Abnormal   Collection Time: 08/11/23  6:43 PM  Result Value Ref Range   Glucose-Capillary 111 (H) 70 - 99 mg/dL    Comment: Glucose reference range applies only to samples taken after fasting for at least 8 hours.  Glucose, capillary     Status: Abnormal   Collection Time: 08/12/23 12:03 AM  Result Value Ref Range   Glucose-Capillary 155 (H) 70 - 99 mg/dL    Comment: Glucose reference range applies only to samples taken after fasting for at least 8 hours.   Comment 1 Notify RN    Comment 2 Document in Chart   Glucose, capillary     Status: Abnormal   Collection Time: 08/12/23  3:40 AM  Result Value Ref Range   Glucose-Capillary 124 (H) 70 - 99 mg/dL    Comment: Glucose reference range applies only to samples taken after fasting for at least 8 hours.  Comment 1 Notify RN    Comment 2 Document in Chart   Renal function panel     Status: Abnormal   Collection Time: 08/12/23  5:45 AM  Result Value Ref Range   Sodium 130 (L) 135 - 145 mmol/L   Potassium 3.9 3.5 - 5.1 mmol/L   Chloride 96 (L) 98 - 111 mmol/L   CO2 26 22 - 32 mmol/L   Glucose, Bld 104 (H) 70 - 99 mg/dL    Comment: Glucose reference range applies only to samples taken after fasting for at least 8 hours.   BUN 8 6 - 20 mg/dL   Creatinine, Ser 1.61 (H) 0.44 - 1.00 mg/dL   Calcium 7.5 (L) 8.9 - 10.3 mg/dL   Phosphorus 1.9 (L) 2.5 - 4.6 mg/dL   Albumin 1.5 (L) 3.5 - 5.0 g/dL   GFR, Estimated 16 (L) >60 mL/min    Comment: (NOTE) Calculated using the CKD-EPI Creatinine Equation (2021)    Anion gap 8 5 - 15    Comment: Performed at [facility], [address] [place], [place]  [address]  Glucose, capillary     Status: Abnormal   Collection Time: 08/12/23  7:44 AM  Result Value Ref Range   Glucose-Capillary 127 (H) 70 - 99 mg/dL    Comment: Glucose reference range applies only to samples taken after fasting for at least 8 hours.  Glucose, capillary     Status: Abnormal   Collection Time: 08/12/23 10:59 AM  Result Value Ref Range   Glucose-Capillary 179 (H) 70 - 99 mg/dL    Comment: Glucose reference range applies only to samples taken after fasting for at least 8 hours.  Glucose, capillary     Status: Abnormal   Collection Time: 08/12/23  3:29 PM  Result Value Ref Range   Glucose-Capillary 215 (H) 70 - 99 mg/dL    Comment: Glucose reference range applies only to samples taken after fasting for at least 8 hours.  Hepatitis B surface antigen     Status: None   Collection Time: 08/12/23  8:12 PM  Result Value Ref Range   Hepatitis B Surface Ag NON REACTIVE NON REACTIVE    Comment: Performed at [facility], [address], [place], [place] [address]  Glucose, capillary     Status: Abnormal   Collection Time: 08/12/23  8:22 PM  Result Value Ref Range   Glucose-Capillary 175 (H) 70 - 99 mg/dL    Comment: Glucose reference range applies only to samples taken after fasting for at least 8 hours.  Glucose, capillary     Status: Abnormal   Collection Time: 08/13/23 12:07 AM  Result Value Ref Range   Glucose-Capillary 144 (H) 70 - 99 mg/dL    Comment: Glucose reference range applies only to samples taken after fasting for at least 8 hours.  Glucose, capillary     Status: Abnormal   Collection Time: 08/13/23  4:22 AM  Result Value Ref Range   Glucose-Capillary 106 (H) 70 - 99 mg/dL    Comment: Glucose reference range applies only to samples taken after fasting for at least 8 hours.  Renal function panel     Status: Abnormal   Collection Time: 08/13/23  5:08 AM  Result Value Ref Range   Sodium 127 (L) 135 - 145 mmol/L   Potassium 4.8 3.5 - 5.1 mmol/L    Chloride 94 (L) 98 - 111 mmol/L   CO2 25 22 - 32 mmol/L  Glucose, Bld 99 70 - 99 mg/dL    Comment: Glucose reference range applies only to samples taken after fasting for at least 8 hours.   BUN 16 6 - 20 mg/dL   Creatinine, Ser 6.21 (H) 0.44 - 1.00 mg/dL   Calcium 7.4 (L) 8.9 - 10.3 mg/dL   Phosphorus 2.6 2.5 - 4.6 mg/dL   Albumin 1.5 (L) 3.5 - 5.0 g/dL   GFR, Estimated 11 (L) >60 mL/min    Comment: (NOTE) Calculated using the CKD-EPI Creatinine Equation (2021)    Anion gap 8 5 - 15    Comment: Performed at [facility], [address], [place], [place] [address]  Glucose, capillary     Status: Abnormal   Collection Time: 08/13/23  7:50 AM  Result Value Ref Range   Glucose-Capillary 130 (H) 70 - 99 mg/dL    Comment: Glucose reference range applies only to samples taken after fasting for at least 8 hours.   Comment 1 Notify RN   CBC     Status: Abnormal   Collection Time: 08/13/23  8:20 AM  Result Value Ref Range   WBC 9.3 4.0 - 10.5 K/uL   RBC 2.51 (L) 3.87 - 5.11 MIL/uL   Hemoglobin 7.4 (L) 12.0 - 15.0 g/dL   HCT 78.4 (L) 69.6 - 29.5 %   MCV 92.8 80.0 - 100.0 fL   MCH 29.5 26.0 - 34.0 pg   MCHC 31.8 30.0 - 36.0 g/dL   RDW 28.4 (H) 13.2 - 44.0 %   Platelets 214 150 - 400 K/uL   nRBC 0.0 0.0 - 0.2 %    Comment: Performed at [facility], [address], [place], [place] [address]   No results found.   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   Chronic back pain    Depression    Diabetes mellitus    ESRD (end stage renal disease) on dialysis [facility])    M,W,F   GERD (gastroesophageal reflux disease)    Heart murmur    Hypertension    NSTEMI (non-ST elevated myocardial infarction) (HCC) 12/20/2021   Palpitations    Parkinson's disease (HCC)     PSH:   Past  Surgical History:  Procedure Laterality Date   AV FISTULA PLACEMENT Left 08/01/2023   Procedure: LEFT ARM BRACHIOBASILIC FISTULA CREATION;  Surgeon: Daria Pastures, MD;  Location: [facility] OR;  Service: Vascular;  Laterality: Left;   BALLOON DILATION N/A 11/06/2022   Procedure: BALLOON DILATION;  Surgeon: Napoleon Form, MD;  Location: [facility]COPY;  Service: Gastroenterology;  Laterality: N/A;   CARPAL TUNNEL RELEASE     COLONOSCOPY WITH PROPOFOL N/A 11/06/2022   Procedure: COLONOSCOPY WITH PROPOFOL;  Surgeon: Napoleon Form, MD;  Location: [facility];  Service: Gastroenterology;  Laterality: N/A;   ESOPHAGOGASTRODUODENOSCOPY (EGD) WITH PROPOFOL N/A 11/06/2022   Procedure: ESOPHAGOGASTRODUODENOSCOPY (EGD) WITH PROPOFOL;  Surgeon: Napoleon Form, MD;  Location: [facility] END[facility];  Service: Gastroenterology;  Laterality: N/A;   INSERTION OF DIALYSIS CATHETER Right 08/01/2023   Procedure: INSERTION OF TUNNELED DIALYSIS CATHETER;  Surgeon: Daria Pastures, MD;  Location: [facility] OR;  Service: Vascular;  Laterality: Right;   IR FLUORO GUIDE CV LINE RIGHT  01/26/2022   IR US GUIDE VASC ACCESS RIGHT  01/26/2022  LEFT HEART CATH AND CORONARY ANGIOGRAPHY N/A 12/20/2021   Procedure: LEFT HEART CATH AND CORONARY ANGIOGRAPHY;  Surgeon: Kathleene Hazel, MD;  Location: [facility] INVAS[facility] LAB;  Service: Cardiovascular;  Laterality: N/A;   OPEN REDUCTION INTERNAL FIXATION (ORIF) FINGER WITH RADIAL BONE GRAFT Right 09/12/2015   Procedure: RIGHT MIDDLE FINGER CLOSED REDUCTION AND PINNING;  Surgeon: Bradly Bienenstock, MD;  Location: [facility] OR;  Service: Orthopedics;  Laterality: Right;   TONSILLECTOMY     TUBAL LIGATION      Allergies:  Allergies  Allergen Reactions   Penicillins Shortness Of Breath and Other (See Comments)    Caused yeast infection Has patient had a PCN reaction causing immediate rash, facial/tongue/throat swelling, SOB or lightheadedness with hypotension: Yes Has patient had a PCN reaction  causing severe rash involving mucus membranes or skin necrosis: No Has patient had a PCN reaction that required hospitalization pt was in the hospital at the time of the reaction Has patient had a PCN reaction occurring within the last 10 years: No If all of the above answers are "NO", then may proceed with Cephalosp   Ultram [Tramadol] Other (See Comments)    Made her tongue raw    Medications:   Prior to Admission medications   Medication Sig Start Date End Date Taking? Authorizing Provider  albuterol (PROVENTIL HFA;VENTOLIN HFA) 108 (90 Base) MCG/ACT inhaler Inhale 2 puffs into the lungs every 4 (four) hours as needed for wheezing or shortness of breath.   Yes [provider]  amLODipine (NORVASC) 10 MG tablet Take 1 tablet (10 mg total) by mouth daily. 11/01/22  Yes Milford, Anderson Malta, FNP  aspirin EC 81 MG tablet Take 1 tablet (81 mg total) by mouth daily. Swallow whole. 05/11/22  Yes Quincy Simmonds, MD  atorvastatin (LIPITOR) 80 MG tablet Take 1 tablet (80 mg total) by mouth daily. 03/11/23  Yes Azucena Fallen, MD  budesonide-formoterol [facility]) 160-4.5 MCG/ACT inhaler Inhale 2 puffs into the lungs in the morning and at bedtime. 11/09/21  Yes [provider]  calcitRIOL (ROCALTROL) 0.25 MCG capsule Take 0.25 mcg by mouth daily. 07/22/23  Yes [provider]  cetirizine (ZYRTEC) 10 MG tablet Take 10 mg by mouth daily as needed for allergies.   Yes [provider]  Darbepoetin Alfa (ARANESP) 60 MCG/0.3ML SOSY injection Inject 0.3 mLs (60 mcg total) into the skin every Thursday at 6pm. 08/08/23  Yes Dezii, Alexandra, DO  eszopiclone 3 MG TABS Take 3 mg by mouth at bedtime. Take immediately before bedtime   Yes [provider]  furosemide (LASIX) 40 MG tablet Take 40 mg by mouth daily. 08/09/23  Yes [provider]  gabapentin (NEURONTIN) 100 MG capsule Take 1 capsule (100 mg total) by mouth 3 (three) times daily. Patient taking  differently: Take 100 mg by mouth 2 (two) times daily. 03/29/23 08/10/24 Yes Arrien, York Ram, MD  hydrALAZINE (APRESOLINE) 100 MG tablet 100 mg 3 (three) times daily. 07/29/23  Yes [provider]  isosorbide mononitrate (IMDUR) 30 MG 24 hr tablet Take 15 mg by mouth daily.   Yes [provider]  linagliptin (TRADJENTA) 5 MG TABS tablet Take 5 mg by mouth daily.   Yes [provider]  mirtazapine (REMERON) 7.5 MG tablet Take 1 tablet (7.5 mg total) by mouth at bedtime. 11/07/22  Yes Ghimire, Werner Lean, MD  naphazoline-pheniramine (NAPHCON-A) 0.025-0.3 % ophthalmic solution Place 2 drops into both eyes 2 (two) times daily.   Yes [provider]  nitroGLYCERIN (NITROSTAT) 0.4 MG SL tablet Place 1 tablet (0.4 mg total) under the tongue every 5 (five) minutes as needed for chest pain. 05/11/22 11/01/23 Yes Quincy Simmonds, MD  oxyCODONE-acetaminophen (PERCOCET) 10-325 MG tablet Take 1 tablet by mouth every 8 (eight) hours as needed for pain. 07/22/23 08/21/23 Yes [provider]  pantoprazole (PROTONIX) 40 MG tablet Take 1 tablet (40 mg total) by mouth daily at 12 noon. Patient taking differently: Take 40 mg by mouth at bedtime. 05/11/22  Yes Quincy Simmonds, MD  potassium chloride (KLOR-CON) 10 MEQ tablet Take 10 mEq by mouth daily. 08/09/23  Yes [provider]  torsemide (DEMADEX) 100 MG tablet Take 100 mg by mouth 2 (two) times daily. 08/09/23  Yes [provider]  triamcinolone ointment (KENALOG) 0.1 % 1 Application 2 (two) times daily as needed. 06/29/23  Yes [provider]    Discontinued Meds:   Medications Discontinued During This Encounter  Medication Reason   ondansetron (ZOFRAN) injection 4 mg    dextrose 10 % infusion    dextrose 20 % infusion Duplicate   melatonin 5 MG TABS    linagliptin (TRADJENTA) 5 MG TABS tablet    isosorbide mononitrate (IMDUR) 30 MG 24 hr tablet    hydrALAZINE (APRESOLINE) tablet 100 mg     calcitRIOL (ROCALTROL) capsule 0.25 mcg    pentafluoroprop-tetrafluoroeth (GEBAUERS) aerosol 1 Application Patient Transfer   lidocaine (PF) (XYLOCAINE) 1 % injection 5 mL Patient Transfer   lidocaine-prilocaine (EMLA) cream 1 Application Patient Transfer   feeding supplement (NEPRO CARB STEADY) liquid 237 mL Patient Transfer   heparin injection 1,000 Units Patient Transfer   anticoagulant sodium citrate solution 5 mL Patient Transfer   alteplase (CATHFLO ACTIVASE) injection 2 mg Patient Transfer   heparin injection 1,600 Units Patient Transfer   dextrose 20 % infusion     Social History:  reports that she has been smoking cigarettes. She has a 0.8 pack-year smoking history. She has been exposed to tobacco smoke. She has never used smokeless tobacco. She reports current alcohol use. She reports current drug use. Drug: Cocaine.  Family History:   Family History  Problem Relation Age of Onset   Diabetes Mother    Kidney disease Mother    Seizures Father    Hypertension Father    Stroke Father    Asthma Other     Blood pressure (!) 154/78, pulse 91, temperature 98.3 F (36.8 C), resp. rate 18, height 5\' 4"  (1.626 m), weight 80.9 kg, SpO2 98%. Physical Exam: Gen alert, no distress No rash, cyanosis or gangrene Sclera anicteric, throat clear  No jvd or bruits Chest clear bilat to bases, no rales/ wheezing RRR no MRG Abd soft ntnd no mass or ascites +bs Ext 1+ bilat pretib edema, no wounds or ulcers Neuro is alert, Ox 3 , nf RIJ TDC intact in chest, lt BBF good bruit     Stephon Weathers, Len Blalock, MD 08/13/2023, 10:17 AM

## 2023-08-13 NOTE — Plan of Care (Signed)
  Problem: Education: Goal: Knowledge of General Education information will improve Description Including pain rating scale, medication(s)/side effects and non-pharmacologic comfort measures Outcome: Progressing   Problem: Clinical Measurements: Goal: Will remain free from infection Outcome: Progressing   Problem: Activity: Goal: Risk for activity intolerance will decrease Outcome: Progressing   Problem: Nutrition: Goal: Adequate nutrition will be maintained Outcome: Progressing   Problem: Safety: Goal: Ability to remain free from injury will improve Outcome: Progressing   

## 2023-08-14 DIAGNOSIS — E162 Hypoglycemia, unspecified: Secondary | ICD-10-CM | POA: Diagnosis not present

## 2023-08-14 LAB — RENAL FUNCTION PANEL
Albumin: 1.6 g/dL — ABNORMAL LOW (ref 3.5–5.0)
Anion gap: 8 (ref 5–15)
BUN: 15 mg/dL (ref 6–20)
CO2: 28 mmol/L (ref 22–32)
Calcium: 7.5 mg/dL — ABNORMAL LOW (ref 8.9–10.3)
Chloride: 94 mmol/L — ABNORMAL LOW (ref 98–111)
Creatinine, Ser: 3.29 mg/dL — ABNORMAL HIGH (ref 0.44–1.00)
GFR, Estimated: 15 mL/min — ABNORMAL LOW (ref >60)
Glucose, Bld: 114 mg/dL — ABNORMAL HIGH (ref 70–99)
Phosphorus: 2.6 mg/dL (ref 2.5–4.6)
Potassium: 4.3 mmol/L (ref 3.5–5.1)
Sodium: 130 mmol/L — ABNORMAL LOW (ref 135–145)

## 2023-08-14 LAB — GLUCOSE, CAPILLARY
Glucose-Capillary: 117 mg/dL — ABNORMAL HIGH (ref 70–99)
Glucose-Capillary: 152 mg/dL — ABNORMAL HIGH (ref 70–99)
Glucose-Capillary: 125 mg/dL — ABNORMAL HIGH (ref 70–99)

## 2023-08-14 LAB — HEPATITIS B SURFACE ANTIBODY, QUANTITATIVE: Hep B S AB Quant (Post): 963 m[IU]/mL

## 2023-08-14 MED ORDER — GLUCAGON (RDNA) 1 MG IJ KIT
PACK | INTRAMUSCULAR | 1 refills | Status: AC
Start: 2023-08-14 — End: 2024-08-13

## 2023-08-14 MED ORDER — GABAPENTIN 100 MG PO CAPS: 100.0000 mg | ORAL_CAPSULE | Freq: Two times a day (BID) | ORAL | 0 refills | Status: AC

## 2023-08-14 MED ORDER — ISOSORB DINITRATE-HYDRALAZINE 20-37.5 MG PO TABS: 2.0000 | ORAL_TABLET | Freq: Three times a day (TID) | ORAL | 0 refills | Status: AC

## 2023-08-14 MED ORDER — NALOXONE HCL 4 MG/0.1ML NA LIQD: NASAL | 0 refills | Status: AC

## 2023-08-14 NOTE — Plan of Care (Signed)

## 2023-08-14 NOTE — Progress Notes (Signed)
Carol [name] is an 62 y.o. female w/ DM, HTN, CASHD, Parkinson's, ESRD presented to ED w/ c/o hypoglycemia. Family noticed pt was somnolent and a bit confused earlier yest evening, and her BS was in the 40s. She did eat and improved shortly but then got worse again.  Takes tradjenta as her only DM medication. Last A1c was 4.7% on 12/15. Has not missed HD sessions (MWF).   Just started D on 12/12 and was dc'd home on 12/16.    Renal-related home meds: - norvasc 10, hydralazine 100 tid, imdur 15 - lasix 40 daily, demadex 100 bid - gabapentin 100 tid - klorcon 10 qd    OP HD: [facility] 4d/wk (usually MTuThF).  - 3h   400/1.5  3K/2.5Ca bath  78kg  TDC/ 1st BB AVF 08/01/23 (maturing)  Hep 2000 - mircera 100 mcg q 2, start 2/19 - rocaltrol 0.5 mcg q hd  Assessment/Plan: Hypoglycemia - consuming cake/ soda; feels better now. Trandjenta (linagliptin) is not thought to be dialyzable.  ESKD - on HD 4d/wk at the TCU.  2 L net UF Tuesday on dialysis  through a right internal jugular tunneled catheter.  Next HD Fri on holiday schedule if she is still here. HTN - BP's high on arrival in 200s, now 116-150's systolic on avg. Cont home meds.  Volume - exam shows mild LE edema, clear lungs. UF goal 2.5- 3 L as tol. CXR I think is overpenetrated. On RA.  Anemia of eskd - Hb 8- 9 here. Get OP records.  MBD ckd - CCa in range, phos is very low at 1.6- pharm recommending oral phos NAK, will give tid for 2 days. Check RFP daily x 3 while here. Hold any binders.  DM2 COPD  Subjective: Hypoglycemic on dialysis previously but blood glucose has stabilized.   Denies fever chill sob.   Chemistry and CBC: Creat  Date/Time Value Ref Range Status  04/03/2023 04:05 PM 4.87 (H) 0.50 - 1.05 mg/dL Final  16/05/9603 54:09 AM 3.54 (H) 0.50 - 1.05 mg/dL Final  81/19/1478 29:56 PM 1.36 (H) 0.50 - 1.05 mg/dL Final    Comment:    For patients >12 years of age, the reference limit for Creatinine is approximately  13% higher for people identified as African-American. .    Creatinine, Ser  Date/Time Value Ref Range Status  08/14/2023 05:38 AM 3.29 (H) 0.44 - 1.00 mg/dL Final  21/30/8657 84:69 AM 4.29 (H) 0.44 - 1.00 mg/dL Final  62/95/2841 32:44 AM 3.15 (H) 0.44 - 1.00 mg/dL Final  08/22/7251 66:44 AM 3.66 (H) 0.44 - 1.00 mg/dL Final  03/47/4259 56:38 AM 3.55 (H) 0.44 - 1.00 mg/dL Final  75/64/3329 51:88 AM 4.64 (H) 0.44 - 1.00 mg/dL Final  41/66/0630 16:01 AM 3.70 (H) 0.44 - 1.00 mg/dL Final  09/32/3557 32:20 AM 4.44 (H) 0.44 - 1.00 mg/dL Final  25/42/7062 37:62 AM 5.57 (H) 0.44 - 1.00 mg/dL Final  83/15/1761 60:73 AM 7.49 (H) 0.44 - 1.00 mg/dL Final  71/01/2693 85:46 AM 7.10 (H) 0.44 - 1.00 mg/dL Final  27/10/5007 38:18 PM 7.14 (H) 0.44 - 1.00 mg/dL Final  29/93/7169 67:89 AM 4.22 (H) 0.44 - 1.00 mg/dL Final  38/05/1750 02:58 AM 4.14 (H) 0.44 - 1.00 mg/dL Final  52/77/8242 35:36 AM 4.10 (H) 0.44 - 1.00 mg/dL Final  14/43/1540 08:67 AM 4.27 (H) 0.44 - 1.00 mg/dL Final  61/95/0932 67:12 AM 4.23 (H) 0.44 - 1.00 mg/dL Final  45/80/9983 38:25 AM 4.32 (H) 0.44 - 1.00 mg/dL  Final  03/23/2023 12:16 PM 4.25 (H) 0.44 - 1.00 mg/dL Final  16/05/9603 54:09 AM 4.22 (H) 0.44 - 1.00 mg/dL Final  81/19/1478 29:56 AM 4.00 (H) 0.44 - 1.00 mg/dL Final  21/30/8657 84:69 AM 4.03 (H) 0.44 - 1.00 mg/dL Final  62/95/2841 32:44 AM 3.85 (H) 0.44 - 1.00 mg/dL Final  08/22/7251 66:44 AM 3.36 (H) 0.44 - 1.00 mg/dL Final  03/47/4259 56:38 PM 3.48 (H) 0.44 - 1.00 mg/dL Final  75/64/3329 51:88 PM 3.49 (H) 0.44 - 1.00 mg/dL Final  41/66/0630 16:01 PM 4.61 (H) 0.44 - 1.00 mg/dL Final  09/32/3557 32:20 AM 3.25 (H) 0.44 - 1.00 mg/dL Final  25/42/7062 37:62 AM 3.56 (H) 0.44 - 1.00 mg/dL Final  83/15/1761 60:73 AM 3.67 (H) 0.44 - 1.00 mg/dL Final  71/01/2693 85:46 AM 3.74 (H) 0.44 - 1.00 mg/dL Final  27/10/5007 38:18 AM 3.38 (H) 0.44 - 1.00 mg/dL Final  29/93/7169 67:89 AM 2.76 (H) 0.44 - 1.00 mg/dL Final  38/05/1750 02:58  AM 2.85 (H) 0.44 - 1.00 mg/dL Final  52/77/8242 35:36 AM 2.62 (H) 0.44 - 1.00 mg/dL Final  14/43/1540 08:67 PM 3.05 (H) 0.44 - 1.00 mg/dL Final  61/95/0932 67:12 AM 3.10 (H) 0.44 - 1.00 mg/dL Final  45/80/9983 38:25 PM 2.96 (H) 0.44 - 1.00 mg/dL Final  05/39/7673 41:93 AM 3.02 (H) 0.44 - 1.00 mg/dL Final  79/09/4095 35:32 AM 3.11 (H) 0.44 - 1.00 mg/dL Final  99/24/2683 41:96 AM 2.32 (H) 0.44 - 1.00 mg/dL Final  22/29/7989 21:19 AM 2.33 (H) 0.44 - 1.00 mg/dL Final  41/74/0814 48:18 PM 3.48 (H) 0.44 - 1.00 mg/dL Final  56/31/4970 26:37 PM 2.05 (H) 0.44 - 1.00 mg/dL Final  85/88/5027 74:12 AM 2.46 (H) 0.44 - 1.00 mg/dL Final  87/86/7672 09:47 AM 2.64 (H) 0.44 - 1.00 mg/dL Final  09/62/8366 29:47 AM 2.97 (H) 0.44 - 1.00 mg/dL Final  65/46/5035 46:56 AM 2.72 (H) 0.44 - 1.00 mg/dL Final  81/27/5170 01:74 AM 2.55 (H) 0.44 - 1.00 mg/dL Final  94/49/6759 16:38 AM 2.67 (H) 0.44 - 1.00 mg/dL Final  46/65/9935 70:17 AM 3.22 (H) 0.44 - 1.00 mg/dL Final  79/39/0300 92:33 AM 2.96 (H) 0.44 - 1.00 mg/dL Final   Recent Labs  Lab 08/11/23 0003 08/11/23 0157 08/11/23 0419 08/12/23 0545 08/13/23 0508 08/14/23 0538  NA 135  --  134* 130* 127* 130*  K 3.4*  --  3.3* 3.9 4.8 4.3  CL 100  --  105 96* 94* 94*  CO2 26  --  24 26 25 28   GLUCOSE 74  --  110* 104* 99 114*  BUN 13  --  15 8 16 15   CREATININE 3.55*  --  3.66* 3.15* 4.29* 3.29*  CALCIUM 8.3*  --  7.7* 7.5* 7.4* 7.5*  PHOS  --  1.6*  --  1.9* 2.6 2.6   Recent Labs  Lab 08/11/23 0003 08/11/23 0419 08/13/23 0820  WBC 7.8 7.3 9.3  NEUTROABS 6.4 5.1  --   HGB 9.4* 8.1* 7.4*  HCT 30.2* 26.0* 23.3*  MCV 91.5 90.6 92.8  PLT 247 214 214   Liver Function Tests: Recent Labs  Lab 08/11/23 0003 08/11/23 0419 08/12/23 0545 08/13/23 0508 08/14/23 0538  AST 29 26  --   --   --   ALT 10 11  --   --   --   ALKPHOS 117 107  --   --   --   BILITOT 0.4 0.3  --   --   --  PROT 6.5 5.3*  --   --   --   ALBUMIN 2.0* 1.7* 1.5* 1.5* 1.6*   No  results for input(s): "LIPASE", "AMYLASE" in the last 168 hours. No results for input(s): "AMMONIA" in the last 168 hours. Cardiac Enzymes: No results for input(s): "CKTOTAL", "CKMB", "CKMBINDEX", "TROPONINI" in the last 168 hours. Iron Studies: No results for input(s): "IRON", "TIBC", "TRANSFERRIN", "FERRITIN" in the last 72 hours. PT/INR: @LABRCNTIP (inr:5)  Xrays/Other Studies: ) Results for orders placed or performed during the hospital encounter of 08/10/23 (from the past 48 hours)  Glucose, capillary     Status: Abnormal   Collection Time: 08/12/23 10:59 AM  Result Value Ref Range   Glucose-Capillary 179 (H) 70 - 99 mg/dL    Comment: Glucose reference range applies only to samples taken after fasting for at least 8 hours.  Glucose, capillary     Status: Abnormal   Collection Time: 08/12/23  3:29 PM  Result Value Ref Range   Glucose-Capillary 215 (H) 70 - 99 mg/dL    Comment: Glucose reference range applies only to samples taken after fasting for at least 8 hours.  Hepatitis B surface antigen     Status: None   Collection Time: 08/12/23  8:12 PM  Result Value Ref Range   Hepatitis B Surface Ag NON REACTIVE NON REACTIVE    Comment: Performed at [facility], [address], [place], [place] [address]  Hepatitis B surface antibody,quantitative     Status: None   Collection Time: 08/12/23  8:12 PM  Result Value Ref Range   Hep B S AB Quant (Post) 963.0 Immunity>10 mIU/mL    Comment: (NOTE)  Status of Immunity                     Anti-HBs Level  ------------------                     -------------- Inconsistent with Immunity                  0.0 - 10.0 Consistent with Immunity                         >10.0 Performed At: [facility] [address] [place], [place] [id number] Jolene Schimke MD EX:[id number]   Glucose, capillary     Status: Abnormal   Collection Time: 08/12/23  8:22 PM  Result Value Ref Range   Glucose-Capillary 175 (H) 70 - 99 mg/dL    Comment:  Glucose reference range applies only to samples taken after fasting for at least 8 hours.  Glucose, capillary     Status: Abnormal   Collection Time: 08/13/23 12:07 AM  Result Value Ref Range   Glucose-Capillary 144 (H) 70 - 99 mg/dL    Comment: Glucose reference range applies only to samples taken after fasting for at least 8 hours.  Glucose, capillary     Status: Abnormal   Collection Time: 08/13/23  4:22 AM  Result Value Ref Range   Glucose-Capillary 106 (H) 70 - 99 mg/dL    Comment: Glucose reference range applies only to samples taken after fasting for at least 8 hours.  Renal function panel     Status: Abnormal   Collection Time: 08/13/23  5:08 AM  Result Value Ref Range   Sodium 127 (L) 135 - 145 mmol/L   Potassium 4.8 3.5 - 5.1 mmol/L   Chloride 94 (L) 98 - 111  mmol/L   CO2 25 22 - 32 mmol/L   Glucose, Bld 99 70 - 99 mg/dL    Comment: Glucose reference range applies only to samples taken after fasting for at least 8 hours.   BUN 16 6 - 20 mg/dL   Creatinine, Ser 2.44 (H) 0.44 - 1.00 mg/dL   Calcium 7.4 (L) 8.9 - 10.3 mg/dL   Phosphorus 2.6 2.5 - 4.6 mg/dL   Albumin 1.5 (L) 3.5 - 5.0 g/dL   GFR, Estimated 11 (L) >60 mL/min    Comment: (NOTE) Calculated using the CKD-EPI Creatinine Equation (2021)    Anion gap 8 5 - 15    Comment: Performed at [facility], [address], [place], [place] [address]  Glucose, capillary     Status: Abnormal   Collection Time: 08/13/23  7:50 AM  Result Value Ref Range   Glucose-Capillary 130 (H) 70 - 99 mg/dL    Comment: Glucose reference range applies only to samples taken after fasting for at least 8 hours.   Comment 1 Notify RN   CBC     Status: Abnormal   Collection Time: 08/13/23  8:20 AM  Result Value Ref Range   WBC 9.3 4.0 - 10.5 K/uL   RBC 2.51 (L) 3.87 - 5.11 MIL/uL   Hemoglobin 7.4 (L) 12.0 - 15.0 g/dL   HCT 25.3 (L) 66.4 - 40.3 %   MCV 92.8 80.0 - 100.0 fL   MCH 29.5 26.0 - 34.0 pg   MCHC 31.8 30.0 - 36.0 g/dL    RDW 47.4 (H) 25.9 - 15.5 %   Platelets 214 150 - 400 K/uL   nRBC 0.0 0.0 - 0.2 %    Comment: Performed at [facility], [address], [place], [place] [address]  Glucose, capillary     Status: None   Collection Time: 08/13/23 11:10 AM  Result Value Ref Range   Glucose-Capillary 98 70 - 99 mg/dL    Comment: Glucose reference range applies only to samples taken after fasting for at least 8 hours.  Glucose, capillary     Status: Abnormal   Collection Time: 08/13/23  4:20 PM  Result Value Ref Range   Glucose-Capillary 158 (H) 70 - 99 mg/dL    Comment: Glucose reference range applies only to samples taken after fasting for at least 8 hours.   Comment 1 Notify RN   Glucose, capillary     Status: Abnormal   Collection Time: 08/13/23  8:05 PM  Result Value Ref Range   Glucose-Capillary 162 (H) 70 - 99 mg/dL    Comment: Glucose reference range applies only to samples taken after fasting for at least 8 hours.  Glucose, capillary     Status: Abnormal   Collection Time: 08/13/23 11:38 PM  Result Value Ref Range   Glucose-Capillary 198 (H) 70 - 99 mg/dL    Comment: Glucose reference range applies only to samples taken after fasting for at least 8 hours.  Glucose, capillary     Status: Abnormal   Collection Time: 08/14/23  3:52 AM  Result Value Ref Range   Glucose-Capillary 117 (H) 70 - 99 mg/dL    Comment: Glucose reference range applies only to samples taken after fasting for at least 8 hours.  Renal function panel     Status: Abnormal   Collection Time: 08/14/23  5:38 AM  Result Value Ref Range   Sodium 130 (L) 135 - 145 mmol/L   Potassium 4.3 3.5 - 5.1  mmol/L   Chloride 94 (L) 98 - 111 mmol/L   CO2 28 22 - 32 mmol/L   Glucose, Bld 114 (H) 70 - 99 mg/dL    Comment: Glucose reference range applies only to samples taken after fasting for at least 8 hours.   BUN 15 6 - 20 mg/dL   Creatinine, Ser 5.70 (H) 0.44 - 1.00 mg/dL   Calcium 7.5 (L) 8.9 - 10.3 mg/dL   Phosphorus 2.6 2.5 -  4.6 mg/dL   Albumin 1.6 (L) 3.5 - 5.0 g/dL   GFR, Estimated 15 (L) >60 mL/min    Comment: (NOTE) Calculated using the CKD-EPI Creatinine Equation (2021)    Anion gap 8 5 - 15    Comment: Performed at [facility], [address], [place], [place] [address]  Glucose, capillary     Status: Abnormal   Collection Time: 08/14/23  8:06 AM  Result Value Ref Range   Glucose-Capillary 152 (H) 70 - 99 mg/dL    Comment: Glucose reference range applies only to samples taken after fasting for at least 8 hours.   Comment 1 Notify RN    Comment 2 Document in Chart    No results found.   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   Chronic back pain    Depression    Diabetes mellitus    ESRD (end stage renal disease) on dialysis [facility])    M,W,F   GERD (gastroesophageal reflux disease)    Heart murmur    Hypertension    NSTEMI (non-ST elevated myocardial infarction) (HCC) 12/20/2021   Palpitations    Parkinson's disease (HCC)     PSH:   Past Surgical History:  Procedure Laterality Date   AV FISTULA PLACEMENT Left 08/01/2023   Procedure: LEFT ARM BRACHIOBASILIC FISTULA CREATION;  Surgeon: Daria Pastures, MD;  Location[facility] OR;  Service: Vascular;  Laterality: Left;   BALLOON DILATION N/A 11/06/2022   Procedure: BALLOON DILATION;  Surgeon: Napoleon Form, MD;  Location: [facility] ENDOSCOPY;  Service: Gastroenterology;  Laterality: N/A;   CARPAL TUNNEL RELEASE     COLONOSCOPY WITH PROPOFOL N/A 11/06/2022   Procedure: COLONOSCOPY WITH PROPOFOL;  Surgeon: Napoleon Form, MD;  Location: [facility] ENDOSCOPY;  Service: Gastroenterology;  Laterality: N/A;   ESOPHAGOGASTRODUODENOSCOPY (EGD) WITH PROPOFOL N/A 11/06/2022   Procedure: ESOPHAGOGASTRODUODENOSCOPY (EGD) WITH PROPOFOL;  Surgeon: Napoleon Form, MD;  Location: [facility] ENDOSCOPY;  Service: Gastroenterology;  Laterality: N/A;   INSERTION OF DIALYSIS CATHETER Right 08/01/2023   Procedure: INSERTION OF TUNNELED DIALYSIS CATHETER;  Surgeon: Daria Pastures, MD;  Location: [facility] OR;  Service: Vascular;  Laterality: Right;   IR FLUORO GUIDE CV LINE RIGHT  01/26/2022   IR US GUIDE VASC ACCESS RIGHT  01/26/2022   LEFT HEART CATH AND CORONARY ANGIOGRAPHY N/A 12/20/2021   Procedure: LEFT HEART CATH AND CORONARY ANGIOGRAPHY;  Surgeon: Kathleene Hazel, MD;  Location: [facility] INVASIVE CV LAB;  Service: Cardiovascular;  Laterality: N/A;   OPEN REDUCTION INTERNAL FIXATION (ORIF) FINGER WITH RADIAL BONE GRAFT Right 09/12/2015   Procedure: RIGHT MIDDLE FINGER CLOSED REDUCTION AND PINNING;  Surgeon: Bradly Bienenstock, MD;  Location: [facility] OR;  Service: Orthopedics;  Laterality: Right;   TONSILLECTOMY     TUBAL LIGATION      Allergies:  Allergies  Allergen Reactions   Penicillins Shortness Of Breath and Other (See Comments)    Caused yeast infection Has patient had a PCN reaction causing immediate rash, facial/tongue/throat swelling, SOB or lightheadedness with hypotension: Yes Has patient had a PCN reaction causing severe rash involving mucus membranes or skin necrosis: No Has patient had a PCN reaction that required hospitalization pt was in the hospital at the time of the reaction Has patient had a PCN reaction occurring within the last 10 years: No If all of the above answers are "NO", then may proceed with Cephalosp   Ultram [Tramadol] Other (See Comments)    Made her tongue raw    Medications:   Prior to Admission medications   Medication Sig Start Date End Date Taking? Authorizing Provider  albuterol (PROVENTIL HFA;VENTOLIN HFA) 108 (90 Base) MCG/ACT inhaler Inhale 2 puffs into the lungs every 4 (four) hours as needed for wheezing or shortness of breath.   Yes [provider]  amLODipine (NORVASC) 10 MG tablet Take 1 tablet (10 mg total) by mouth  daily. 11/01/22  Yes Milford, Anderson Malta, FNP  aspirin EC 81 MG tablet Take 1 tablet (81 mg total) by mouth daily. Swallow whole. 05/11/22  Yes Quincy Simmonds, MD  atorvastatin (LIPITOR) 80 MG tablet Take 1 tablet (80 mg total) by mouth daily. 03/11/23  Yes Azucena Fallen, MD  budesonide-formoterol [facility]) 160-4.5 MCG/ACT inhaler Inhale 2 puffs into the lungs in the morning and at bedtime. 11/09/21  Yes [provider]  calcitRIOL (ROCALTROL) 0.25 MCG capsule Take 0.25 mcg by mouth daily. 07/22/23  Yes [provider]  cetirizine (ZYRTEC) 10 MG tablet Take 10 mg by mouth daily as needed for allergies.   Yes [provider]  Darbepoetin Alfa (ARANESP) 60 MCG/0.3ML SOSY injection Inject 0.3 mLs (60 mcg total) into the skin every Thursday at 6pm. 08/08/23  Yes Dezii, Alexandra, DO  eszopiclone 3 MG TABS Take 3 mg by mouth at bedtime. Take immediately before bedtime   Yes [provider]  furosemide (LASIX) 40 MG tablet Take 40 mg by mouth daily. 08/09/23  Yes [provider]  gabapentin (NEURONTIN) 100 MG capsule Take 1 capsule (100 mg total) by mouth 3 (three) times daily. Patient taking differently: Take 100 mg by mouth 2 (two) times daily. 03/29/23 08/10/24 Yes Arrien, York Ram, MD  hydrALAZINE (APRESOLINE) 100 MG tablet 100 mg 3 (three) times daily. 07/29/23  Yes [provider]  isosorbide mononitrate (IMDUR) 30 MG 24 hr tablet Take 15 mg by mouth daily.   Yes [provider]  linagliptin (TRADJENTA) 5 MG TABS tablet Take 5 mg by mouth daily.   Yes [provider]  mirtazapine (REMERON) 7.5 MG tablet Take 1 tablet (7.5 mg total) by mouth at bedtime. 11/07/22  Yes Ghimire, Werner Lean, MD  naphazoline-pheniramine (NAPHCON-A) 0.025-0.3 % ophthalmic solution Place 2 drops into both eyes 2 (two) times daily.   Yes [provider]  nitroGLYCERIN (NITROSTAT) 0.4 MG SL tablet Place 1 tablet (0.4 mg total) under the  tongue every 5 (five) minutes as needed for chest pain. 05/11/22 11/01/23 Yes Quincy Simmonds, MD  oxyCODONE-acetaminophen (PERCOCET) 10-325 MG tablet Take 1 tablet by mouth every 8 (eight) hours as needed for pain. 07/22/23 08/21/23 Yes [provider]  pantoprazole (PROTONIX) 40 MG tablet Take 1 tablet (40 mg total) by mouth daily at 12 noon. Patient taking differently: Take 40 mg by mouth at bedtime. 05/11/22  Yes Quincy Simmonds, MD  potassium chloride (KLOR-CON) 10 MEQ tablet Take 10 mEq by mouth daily. 08/09/23  Yes [provider]  torsemide (DEMADEX) 100 MG tablet Take 100 mg by mouth 2 (two) times daily. 08/09/23  Yes [provider]  triamcinolone ointment (KENALOG) 0.1 % 1 Application 2 (two) times daily as needed. 06/29/23  Yes [provider]    Discontinued Meds:   Medications Discontinued During This Encounter  Medication Reason   ondansetron (ZOFRAN) injection 4 mg    dextrose 10 % infusion    dextrose 20 % infusion Duplicate   melatonin 5 MG TABS    linagliptin (TRADJENTA) 5 MG TABS tablet    isosorbide mononitrate (IMDUR) 30 MG 24 hr tablet    hydrALAZINE (APRESOLINE) tablet 100 mg    calcitRIOL (ROCALTROL) capsule 0.25 mcg    pentafluoroprop-tetrafluoroeth (GEBAUERS) aerosol 1 Application Patient Transfer   lidocaine (PF) (XYLOCAINE) 1 % injection 5 mL Patient Transfer   lidocaine-prilocaine (EMLA) cream 1 Application Patient Transfer   feeding supplement (NEPRO CARB STEADY) liquid 237 mL Patient Transfer   heparin injection 1,000 Units Patient Transfer   anticoagulant sodium citrate solution 5 mL Patient Transfer   alteplase (CATHFLO ACTIVASE) injection 2 mg Patient Transfer   heparin injection 1,600 Units Patient Transfer   dextrose 20 % infusion     Social History:  reports that she has been smoking cigarettes. She has a 0.8 pack-year smoking history. She has been exposed to tobacco smoke. She has never used smokeless tobacco. She  reports current alcohol use. She reports current drug use. Drug: Cocaine.  Family History:   Family History  Problem Relation Age of Onset   Diabetes Mother    Kidney disease Mother    Seizures Father    Hypertension Father    Stroke Father    Asthma Other     Blood pressure 123/74, pulse 92, temperature 99.1 F (37.3 C), temperature source Oral, resp. rate 18, height 5\' 4"  (1.626 m), weight 78.9 kg, SpO2 98%. Physical Exam: Gen alert, no distress No rash, cyanosis or gangrene Sclera anicteric, throat clear  No jvd or bruits Chest clear bilat to bases, no rales/ wheezing RRR no MRG Abd soft ntnd no mass or ascites +bs Ext 1+ bilat pretib edema, no wounds or ulcers Neuro is alert, Ox 3 , nf RIJ TDC intact in chest, lt BBF good bruit     Belmira Daley, Len Blalock, MD 08/14/2023, 9:27 AM

## 2023-08-14 NOTE — Discharge Summary (Signed)
Physician Discharge Summary   Patient: Carol [name] MRN: [id number] DOB: [birth year]  Admit date:     08/10/2023  Discharge date: 08/14/23  Discharge Physician: Debarah Crape   PCP: [facility]   Recommendations at discharge:    Continue hemodialysis on the routine schedule. Continue to monitor your blood pressure and blood sugar.  Follow-up with your PCP to discuss the results and medication changes.  Discharge Diagnoses: Principal Problem:   Hypoglycemia Active Problems:   End stage renal disease (HCC)   DM2 (diabetes mellitus, type 2) (HCC)   Essential hypertension   borderline Prolonged QT interval   Acute metabolic encephalopathy   Hypomagnesemia   Hypokalemia   History of COPD   History of anemia due to chronic kidney disease  Resolved Problems:   * No resolved hospital problems. [facility] Course: This patient is a 62 year old female with ESRD on Monday Wednesday Friday hemodialysis, history of diabetes, hypertension, polysubstance abuse, COPD, who presents on 12/21 with symptomatic hypoglycemia.  Blood glucose at home reportedly in the 20s and 40s.  Patient was confused and somnolent on arrival.  She required treatment with IV dextrose.  Patient has been on monotherapy with Tradjenta outpatient, no other oral antihyperglycemics or insulin found.  She is a fairly new ESRD patient, initiated on hemodialysis within the last 30 days.  It is suspected that her hypoglycemia was related to Tradjenta combined with her new ESRD status and polysubstance abuse, making her a brittle diabetic.  Hemoglobin A1c reveals 4.7%.  We have discontinued all diabetes medications and discussed extensively with the patient.  She endorses understanding.   Patient was transitioned to D10 drip which was then discontinued on 12/24.  She maintained stable CBGs for 24 hours and had adequate oral intake.  Her metabolic encephalopathy resolved completely and patient returned to her  baseline. I had extensive discussion regarding her medications at home, as polypharmacy is certainly playing a role in her ability to manage medications outpatient. It appears patient was prescribed multiple diuretics but she is not taking any of them.  We have discontinued these at discharge. While admitted, we have titrated her blood pressure medications.  We did discuss extensively the dangers of opioid use, especially in the setting of ESRD and with concurrent sleep aids (lunesta).  Patient is taking high-dose oxycodone at home and refuses to taper to a lower dose.  After extensive discussion, I have provided Narcan for her at discharge and instructed on appropriate use.  Her husband was at bedside for this discussion and endorses understanding.  Additionally patient is taking gabapentin 3 times daily, we have decreased this to twice daily and encourage further tapering.  Regarding her polysubstance abuse, patient does endorse that she has been sober of all substances for the last 30 days.  I have encouraged continued abstinence.   Consultants: Nephrology Procedures performed: n/a Disposition: Home Diet recommendation:  Discharge Diet Orders (From admission, onward)     Start     Ordered   08/14/23 0000  Diet - low sodium heart healthy        08/14/23 1316           Cardiac and Carb modified diet DISCHARGE MEDICATION: Allergies as of 08/14/2023       Reactions   Penicillins Shortness Of Breath, Other (See Comments)   Caused yeast infection Has patient had a PCN reaction causing immediate rash, facial/tongue/throat swelling, SOB or lightheadedness with hypotension: Yes Has patient had a PCN reaction causing  severe rash involving mucus membranes or skin necrosis: No Has patient had a PCN reaction that required hospitalization pt was in the hospital at the time of the reaction Has patient had a PCN reaction occurring within the last 10 years: No If all of the above answers are "NO",  then may proceed with Cephalosp   Ultram [tramadol] Other (See Comments)   Made her tongue raw        Medication List     STOP taking these medications    calcitRIOL 0.25 MCG capsule Commonly known as: ROCALTROL   furosemide 40 MG tablet Commonly known as: LASIX   hydrALAZINE 100 MG tablet Commonly known as: APRESOLINE   isosorbide mononitrate 30 MG 24 hr tablet Commonly known as: IMDUR   linagliptin 5 MG Tabs tablet Commonly known as: TRADJENTA   mirtazapine 7.5 MG tablet Commonly known as: REMERON   potassium chloride 10 MEQ tablet Commonly known as: KLOR-CON   torsemide 100 MG tablet Commonly known as: DEMADEX       TAKE these medications    albuterol 108 (90 Base) MCG/ACT inhaler Commonly known as: VENTOLIN HFA Inhale 2 puffs into the lungs every 4 (four) hours as needed for wheezing or shortness of breath.   amLODipine 10 MG tablet Commonly known as: NORVASC Take 1 tablet (10 mg total) by mouth daily.   Aspirin Low Dose 81 MG tablet Generic drug: aspirin EC Take 1 tablet (81 mg total) by mouth daily. Swallow whole.   atorvastatin 80 MG tablet Commonly known as: LIPITOR Take 1 tablet (80 mg total) by mouth daily.   budesonide-formoterol 160-4.5 MCG/ACT inhaler Commonly known as: SYMBICORT Inhale 2 puffs into the lungs in the morning and at bedtime.   cetirizine 10 MG tablet Commonly known as: ZYRTEC Take 10 mg by mouth daily as needed for allergies.   Darbepoetin Alfa 60 MCG/0.3ML Sosy injection Commonly known as: ARANESP Inject 0.3 mLs (60 mcg total) into the skin every Thursday at 6pm.   Eszopiclone 3 MG Tabs Take 3 mg by mouth at bedtime. Take immediately before bedtime   gabapentin 100 MG capsule Commonly known as: NEURONTIN Take 1 capsule (100 mg total) by mouth 2 (two) times daily.   glucagon 1 MG injection Follow package directions for low blood sugar.   isosorbide-hydrALAZINE 20-37.5 MG tablet Commonly known as: BIDIL Take 2  tablets by mouth 3 (three) times daily.   naloxone 4 MG/0.1ML Liqd nasal spray kit Commonly known as: NARCAN If concern for opioid overdose, spray in nostril. Call 911.   naphazoline-pheniramine 0.025-0.3 % ophthalmic solution Commonly known as: NAPHCON-A Place 2 drops into both eyes 2 (two) times daily.   nitroGLYCERIN 0.4 MG SL tablet Commonly known as: NITROSTAT Place 1 tablet (0.4 mg total) under the tongue every 5 (five) minutes as needed for chest pain.   oxyCODONE-acetaminophen 10-325 MG tablet Commonly known as: PERCOCET Take 1 tablet by mouth every 8 (eight) hours as needed for pain.   pantoprazole 40 MG tablet Commonly known as: PROTONIX Take 1 tablet (40 mg total) by mouth daily at 12 noon. What changed: when to take this   triamcinolone ointment 0.1 % Commonly known as: KENALOG 1 Application 2 (two) times daily as needed.        Follow-up Information     [facility] Follow [facility].   Specialty: Home Health Services Why: The home health agency will contact you for the first home visit. Contact information: [address]  [place] [address] [phone]                Discharge Exam: Filed Weights   08/12/23 1009 08/13/23 0806 08/13/23 1156  Weight: 77.1 kg 80.9 kg 78.9 kg   Constitutional:  Normal appearance. Non toxic-appearing.  HENT: Head Normocephalic and atraumatic.  Mucous membranes are moist.  Eyes:  Extraocular intact. Conjunctivae normal. Pupils are equal, round, and reactive to light.  Cardiovascular: Rate and Rhythm: Normal rate and regular rhythm.  Pulmonary: Non labored, symmetric rise of chest wall.  Musculoskeletal:  Normal range of motion.  Skin: warm and dry. not jaundiced.  Neurological: No focal deficit present. alert. Oriented. Psychiatric: Mood and Affect congruent.    Condition at discharge: stable  The results of significant diagnostics from this hospitalization (including imaging, microbiology,  ancillary and laboratory) are listed below for reference.   Imaging Studies: CT HEAD WO CONTRAST ( ) Result Date: 08/11/2023 CLINICAL DATA:  Mental status change, unknown cause.  Hypoglycemia. EXAM: CT HEAD WITHOUT CONTRAST TECHNIQUE: Contiguous axial images were obtained from the base of the skull through the vertex without intravenous contrast. RADIATION DOSE REDUCTION: This exam was performed according to the departmental dose-optimization program which includes automated exposure control, adjustment of the mA and/or kV according to patient size and/or use of iterative reconstruction technique. 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 No acute intracranial abnormality. Specifically, no hemorrhage, hydrocephalus, mass lesion, acute infarction, or significant intracranial injury. Vascular: No hyperdense vessel or unexpected calcification. Skull: No acute calvarial abnormality. Sinuses/Orbits: No acute findings Other: None IMPRESSION: No acute intracranial abnormality. Electronically Signed   By: Charlett Nose M.D.   On: 08/11/2023 01:06   DG Chest Port 1 View Result Date: 08/11/2023 CLINICAL DATA:  Weakness. EXAM: PORTABLE CHEST 1 VIEW COMPARISON:  August 04, 2023 FINDINGS: There is stable right-sided venous catheter positioning. The cardiac silhouette is mildly enlarged and unchanged in size. Mild, diffuse, chronic appearing increased interstitial lung markings are seen. Mild superimposed atelectasis and/or infiltrate is seen within the mid left lung and bilateral lung bases. No pleural effusion or pneumothorax is identified. The visualized skeletal structures are unremarkable. IMPRESSION: Chronic appearing increased interstitial lung markings with mild superimposed mid left lung and bibasilar atelectasis and/or infiltrate. Electronically Signed   By: Aram Candela M.D.   On: 08/11/2023 00:06    DG CHEST PORT 1 VIEW Result Date: 08/04/2023 CLINICAL DATA:  Chest pain and shortness of breath EXAM: PORTABLE CHEST 1 VIEW COMPARISON:  08/01/2023 FINDINGS: Right IJ CVC tip in the low SVC. Stable cardiomediastinal silhouette. P No focal consolidation, pleural effusion, or pneumothorax. No displaced rib fractures. IMPRESSION: No active disease. Electronically Signed   By: Minerva Fester M.D.   On: 08/04/2023 21:05   VAS [place] LOWER EXTREMITY VENOUS (DVT) Result Date: 08/04/2023  Lower Venous DVT Study Patient Name:  AUDREANNA ROSEBUSH  Date of Exam:   08/03/2023 Medical Rec #: [id number]                Accession #:    [id number] Date of Birth: 07-24-1961                Patient Gender: F Patient Age:   70 years Exam Location:  [facility] Procedure:      VAS [place] LOWER EXTREMITY VENOUS (DVT) Referring Phys: A POWELL JR --------------------------------------------------------------------------------  Indications: Edema.  Risk Factors: CKD V, CHF, cocaine abuse. Comparison  Study: Prior negative right LEV done 04/26/18 Performing Technologist: Sherren Kerns RVS  Examination Guidelines: A complete evaluation includes B-mode imaging, spectral Doppler, color Doppler, and power Doppler as needed of all accessible portions of each vessel. Bilateral testing is considered an integral part of a complete examination. Limited examinations for reoccurring indications may be performed as noted. The reflux portion of the exam is performed with the patient in reverse Trendelenburg.  +---------+---------------+---------+-----------+----------+--------------+ RIGHT    CompressibilityPhasicitySpontaneityPropertiesThrombus Aging +---------+---------------+---------+-----------+----------+--------------+ CFV      Full           Yes      Yes                                 +---------+---------------+---------+-----------+----------+--------------+ SFJ      Full                                                         +---------+---------------+---------+-----------+----------+--------------+ FV Prox  Full                                                        +---------+---------------+---------+-----------+----------+--------------+ FV Mid   Full                                                        +---------+---------------+---------+-----------+----------+--------------+ FV DistalFull                                                        +---------+---------------+---------+-----------+----------+--------------+ PFV      Full                                                        +---------+---------------+---------+-----------+----------+--------------+ POP      Full           Yes      Yes                                 +---------+---------------+---------+-----------+----------+--------------+ PTV      Full                                                        +---------+---------------+---------+-----------+----------+--------------+ PERO     Full                                                        +---------+---------------+---------+-----------+----------+--------------+   +---------+---------------+---------+-----------+----------+--------------+  LEFT     CompressibilityPhasicitySpontaneityPropertiesThrombus Aging +---------+---------------+---------+-----------+----------+--------------+ CFV      Full           Yes      Yes                                 +---------+---------------+---------+-----------+----------+--------------+ SFJ      Full                                                        +---------+---------------+---------+-----------+----------+--------------+ FV Prox  Full                                                        +---------+---------------+---------+-----------+----------+--------------+ FV Mid   Full                                                         +---------+---------------+---------+-----------+----------+--------------+ FV DistalFull           Yes      Yes                                 +---------+---------------+---------+-----------+----------+--------------+ PFV      Full                                                        +---------+---------------+---------+-----------+----------+--------------+ POP      Full           Yes      Yes                                 +---------+---------------+---------+-----------+----------+--------------+ PTV      Full                                                        +---------+---------------+---------+-----------+----------+--------------+ PERO     Full                                                        +---------+---------------+---------+-----------+----------+--------------+     Summary: RIGHT: - No evidence of deep vein thrombosis in the lower extremity. No indirect evidence of obstruction proximal to the inguinal ligament.  - No cystic structure found in the popliteal fossa. interstitial edema noted throughout  LEFT: - No evidence of deep vein thrombosis in the lower  extremity. No indirect evidence of obstruction proximal to the inguinal ligament.  - No cystic structure found in the popliteal fossa. Interstitial edema noted throughout.  *See table(s) above for measurements and observations. Electronically signed by Heath Lark on 08/04/2023 at 11:09:31 AM.    Final    ECHOCARDIOGRAM COMPLETE Result Date: 08/02/2023    ECHOCARDIOGRAM REPORT   Patient Name:   EMANUEL ZOCH Date of Exam: 08/02/2023 Medical Rec #:  [id number]               Height:       64.0 in Accession #:    [id number]              Weight:       180.6 lb Date of Birth:  [birth year]               BSA:          1.873 m Patient Age:    62 years                BP:           162/76 mmHg Patient Gender: F                       HR:           98 bpm. Exam Location:  Inpatient Procedure: 2D  Echo, Cardiac Doppler and Color Doppler Indications:    CHF  History:        Patient has prior history of Echocardiogram examinations, most                 recent 03/08/2023. CHF, COPD; Risk Factors:Hypertension and                 Diabetes.  Sonographer:    Darlys Gales Referring Phys: [phone] A CALDWELL POWELL JR IMPRESSIONS  1. LVOT obstruction with flow acceleration, no definite SAM or SAM related MR. Max instantaneous gradient 21 mmHg, velocity 2.3 m/s. Consider cardiac MRI to further evaluate etiology of left ventricular wall thickening.  2. Left ventricular ejection fraction, by estimation, is 65 to 70%. The left ventricle has normal function. The left ventricle has no regional wall motion abnormalities. There is severe left ventricular hypertrophy. Indeterminate diastolic filling due to E-A fusion.  3. Right ventricular systolic function is normal. The right ventricular size is normal. Tricuspid regurgitation signal is inadequate for assessing PA pressure.  4. The mitral valve is grossly normal. Trivial mitral valve regurgitation. No evidence of mitral stenosis.  5. The aortic valve is tricuspid. Aortic valve regurgitation is not visualized. No aortic stenosis is present.  6. The inferior vena cava is normal in size with greater than 50% respiratory variability, suggesting right atrial pressure of 3 mmHg. FINDINGS  Left Ventricle: Left ventricular ejection fraction, by estimation, is 65 to 70%. The left ventricle has normal function. The left ventricle has no regional wall motion abnormalities. The left ventricular internal cavity size was normal in size. There is  severe left ventricular hypertrophy. Indeterminate diastolic filling due to E-A fusion. Right Ventricle: The right ventricular size is normal. Right vetricular wall thickness was not well visualized. Right ventricular systolic function is normal. Tricuspid regurgitation signal is inadequate for assessing PA pressure. Left Atrium: Left atrial size  was normal in size. Right Atrium: Right atrial size was normal in size. Pericardium: There is no evidence of pericardial effusion. Mitral Valve: The mitral valve is grossly normal. Trivial mitral valve  regurgitation. No evidence of mitral valve stenosis. Tricuspid Valve: The tricuspid valve is normal in structure. Tricuspid valve regurgitation is trivial. No evidence of tricuspid stenosis. Aortic Valve: The aortic valve is tricuspid. Aortic valve regurgitation is not visualized. No aortic stenosis is present. Aortic valve mean gradient measures 10.0 mmHg. Aortic valve peak gradient measures 14.7 mmHg. Aortic valve area, by VTI measures 2.58 cm. Pulmonic Valve: The pulmonic valve was not well visualized. Pulmonic valve regurgitation is not visualized. No evidence of pulmonic stenosis. Aorta: The aortic root is normal in size and structure. Venous: The inferior vena cava is normal in size with greater than 50% respiratory variability, suggesting right atrial pressure of 3 mmHg. IAS/Shunts: No atrial level shunt detected by color flow Doppler.  LEFT VENTRICLE PLAX 2D LVIDd:         3.00 cm   Diastology LV PW:         1.77 cm   LV e' medial:    4.13 cm/s LV IVS:        1.60 cm   LV E/e' medial:  17.9 LVOT diam:     1.80 cm   LV e' lateral:   4.46 cm/s LV SV:         92        LV E/e' lateral: 16.6 LV SV Index:   49 LVOT Area:     2.54 cm  RIGHT VENTRICLE RV S prime:     16.40 cm/s TAPSE (M-mode): 4.3 cm LEFT ATRIUM             Index        RIGHT ATRIUM          Index LA Vol (A2C):   30.3 ml 16.18 ml/m  RA Area:     9.09 cm LA Vol (A4C):   64.5 ml 34.43 ml/m  RA Volume:   16.70 ml 8.92 ml/m LA Biplane Vol: 46.0 ml 24.56 ml/m  AORTIC VALVE AV Area (Vmax):    2.33 cm AV Area (Vmean):   2.27 cm AV Area (VTI):     2.58 cm AV Vmax:           192.00 cm/s AV Vmean:          156.000 cm/s AV VTI:            0.355 m AV Peak Grad:      14.7 mmHg AV Mean Grad:      10.0 mmHg LVOT Vmax:         176.00 cm/s LVOT Vmean:         139.000 cm/s LVOT VTI:          0.360 m LVOT/AV VTI ratio: 1.01 MITRAL VALVE MV Area (PHT): 3.07 cm     SHUNTS MV Decel Time: 247 msec     Systemic VTI:  0.36 m MV E velocity: 73.90 cm/s   Systemic Diam: 1.80 cm MV A velocity: 121.00 cm/s MV E/A ratio:  0.61 Weston Brass MD Electronically signed by Weston Brass MD Signature Date/Time: 08/02/2023/3:14:51 PM    Final    DG CHEST PORT 1 VIEW Result Date: 08/01/2023 CLINICAL DATA:  Dialysis catheter in place. EXAM: PORTABLE CHEST 1 VIEW COMPARISON:  Chest radiograph dated 07/30/2023. FINDINGS: Right IJ dialysis catheter with tip at the cavoatrial junction. No pneumothorax. No focal consolidation, pleural effusion. Stable cardiac silhouette. No acute osseous pathology. IMPRESSION: Right IJ dialysis catheter with tip at the cavoatrial junction. No pneumothorax. Electronically Signed   By: Burtis Junes  Radparvar M.D.   On: 08/01/2023 15:56   HYBRID OR IMAGING ([facility] ONLY) Result Date: 08/01/2023 There is no interpretation for this exam.  This order is for images obtained during a surgical procedure.  Please See "Surgeries" Tab for more information regarding the procedure.   VAS [place] UPPER EXT VEIN MAPPING (PRE-OP AVF) Result Date: 07/31/2023 UPPER EXTREMITY VEIN MAPPING Patient Name:  FAE STETZ  Date of Exam:   07/31/2023 Medical Rec #: [id number]                Accession #:    [id number] Date of Birth: 10/11/1960                Patient Gender: F Patient Age:   33 years Exam Location:  [facility] Procedure:      VAS [place] UPPER EXT VEIN MAPPING (PRE-OP AVF) Referring Phys: Remi Deter PEEPLES --------------------------------------------------------------------------------  Indications: Pre-access. Comparison Study: No prior studies. Performing Technologist: Chanda Busing RVT  Examination Guidelines: A complete evaluation includes B-mode imaging, spectral Doppler, color Doppler, and power Doppler as needed of all accessible portions of each vessel.  Bilateral testing is considered an integral part of a complete examination. Limited examinations for reoccurring indications may be performed as noted. +-----------------+-------------+----------+---------+ Right Cephalic   Diameter (cm)Depth (cm)Findings  +-----------------+-------------+----------+---------+ Shoulder             0.29        0.80             +-----------------+-------------+----------+---------+ Prox upper arm       0.17        0.70             +-----------------+-------------+----------+---------+ Mid upper arm        0.22        0.27             +-----------------+-------------+----------+---------+ Dist upper arm       0.21        0.42             +-----------------+-------------+----------+---------+ Antecubital fossa    0.43        0.23   branching +-----------------+-------------+----------+---------+ Prox forearm         0.23        0.52   branching +-----------------+-------------+----------+---------+ Mid forearm          0.17        0.24   Thrombus  +-----------------+-------------+----------+---------+ Dist forearm         0.17        0.15             +-----------------+-------------+----------+---------+ +-----------------+-------------+----------+---------+ Right Basilic    Diameter (cm)Depth (cm)Findings  +-----------------+-------------+----------+---------+ Shoulder             0.18        1.00             +-----------------+-------------+----------+---------+ Dist upper arm       0.13        0.98             +-----------------+-------------+----------+---------+ Antecubital fossa    0.16        0.18   branching +-----------------+-------------+----------+---------+ Prox forearm         0.08        0.13             +-----------------+-------------+----------+---------+ Mid forearm          0.13        0.14             +-----------------+-------------+----------+---------+  Distal forearm       0.08         0.14             +-----------------+-------------+----------+---------+ +-----------------+-------------+----------+---------+ Left Cephalic    Diameter (cm)Depth (cm)Findings  +-----------------+-------------+----------+---------+ Shoulder             0.22        0.96             +-----------------+-------------+----------+---------+ Prox upper arm       0.18        0.52   branching +-----------------+-------------+----------+---------+ Mid upper arm        0.16        0.48             +-----------------+-------------+----------+---------+ Dist upper arm       0.15        0.43             +-----------------+-------------+----------+---------+ Antecubital fossa    0.15        0.61   branching +-----------------+-------------+----------+---------+ Prox forearm         0.17        0.16      IV     +-----------------+-------------+----------+---------+ Mid forearm          0.26        0.16             +-----------------+-------------+----------+---------+ Dist forearm         0.25        0.25             +-----------------+-------------+----------+---------+ +-----------------+-------------+----------+---------+ Left Basilic     Diameter (cm)Depth (cm)Findings  +-----------------+-------------+----------+---------+ Shoulder             0.21        2.10             +-----------------+-------------+----------+---------+ Mid upper arm        0.23        1.50             +-----------------+-------------+----------+---------+ Dist upper arm       0.24        0.84   branching +-----------------+-------------+----------+---------+ Antecubital fossa    0.14        0.74   branching +-----------------+-------------+----------+---------+ Prox forearm         0.13        0.14             +-----------------+-------------+----------+---------+ Mid forearm          0.14        0.17             +-----------------+-------------+----------+---------+  Distal forearm       0.12        0.20             +-----------------+-------------+----------+---------+ *See table(s) above for measurements and observations.  Diagnosing physician: Coral Else MD Electronically signed by Coral Else MD on 07/31/2023 at 9:09:24 PM.    Final    DG Chest 2 View Result Date: 07/30/2023 CLINICAL DATA:  Chest pain, shortness of breath EXAM: CHEST - 2 VIEW COMPARISON:  03/22/2023 FINDINGS: Lungs are clear.  No pleural effusion or pneumothorax. The heart is top-normal in size. Suspected healed fracture deformities of the in the right posterior 6th and left posterior 7th ribs. Thoracic spine is within normal limits. IMPRESSION: Normal chest radiographs. Electronically Signed   By: Charline Bills M.D.   On: 07/30/2023 22:46  Microbiology: Results for orders placed or performed during the hospital encounter of 01/10/22  Resp Panel by RT-PCR (Flu A&B, Covid) Nasopharyngeal Swab     Status: None   Collection Time: 01/10/22  9:49 AM   Specimen: Nasopharyngeal Swab; Nasopharyngeal(NP) swabs in vial transport medium  Result Value Ref Range Status   SARS Coronavirus 2 by RT PCR NEGATIVE NEGATIVE Final    Comment: (NOTE) SARS-CoV-2 target nucleic acids are NOT DETECTED.  The SARS-CoV-2 RNA is generally detectable in upper respiratory specimens during the acute phase of infection. The lowest concentration of SARS-CoV-2 viral copies this assay can detect is 138 copies/mL. A negative result does not preclude SARS-Cov-2 infection and should not be used as the sole basis for treatment or other patient management decisions. A negative result may occur with  improper specimen collection/handling, submission of specimen other than nasopharyngeal swab, presence of viral mutation(s) within the areas targeted by this assay, and inadequate number of viral copies(<138 copies/mL). A negative result must be combined with clinical observations, patient history, and  epidemiological information. The expected result is Negative.  Fact Sheet for Patients:  BloggerCourse.com  Fact Sheet for Healthcare Providers:  SeriousBroker.it  This test is no t yet approved or cleared by the [place] FDA and  has been authorized for detection and/or diagnosis of SARS-CoV-2 by FDA under an Emergency Use Authorization (EUA). This EUA will remain  in effect (meaning this test can be used) for the duration of the COVID-19 declaration under Section 564(b)(1) of the Act, 21 U.S.C.section 360bbb-3(b)(1), unless the authorization is terminated  or revoked sooner.       Influenza A by PCR NEGATIVE NEGATIVE Final   Influenza B by PCR NEGATIVE NEGATIVE Final    Comment: (NOTE) The Xpert Xpress SARS-CoV-2/FLU/RSV plus assay is intended as an aid in the diagnosis of influenza from Nasopharyngeal swab specimens and should not be used as a sole basis for treatment. Nasal washings and aspirates are unacceptable for Xpert Xpress SARS-CoV-2/FLU/RSV testing.  Fact Sheet for Patients: BloggerCourse.com  Fact Sheet for Healthcare Providers: SeriousBroker.it  This test is not yet approved or cleared by the [place] FDA and has been authorized for detection and/or diagnosis of SARS-CoV-2 by FDA under an Emergency Use Authorization (EUA). This EUA will remain in effect (meaning this test can be used) for the duration of the COVID-19 declaration under Section 564(b)(1) of the Act, 21 U.S.C. section 360bbb-3(b)(1), unless the authorization is terminated or revoked.  Performed at [facility], [address], [place], [place] [address]     Labs: CBC: Recent Labs  Lab 08/11/23 0003 08/11/23 0419 08/13/23 0820  WBC 7.8 7.3 9.3  NEUTROABS 6.4 5.1  --   HGB 9.4* 8.1* 7.4*  HCT 30.2* 26.0* 23.3*  MCV 91.5 90.6 92.8  PLT 247 214 214   Basic Metabolic  Panel: Recent Labs  Lab 08/11/23 0003 08/11/23 0157 08/11/23 0419 08/12/23 0545 08/13/23 0508 08/14/23 0538  NA 135  --  134* 130* 127* 130*  K 3.4*  --  3.3* 3.9 4.8 4.3  CL 100  --  105 96* 94* 94*  CO2 26  --  24 26 25 28   GLUCOSE 74  --  110* 104* 99 114*  BUN 13  --  15 8 16 15   CREATININE 3.55*  --  3.66* 3.15* 4.29* 3.29*  CALCIUM 8.3*  --  7.7* 7.5* 7.4* 7.5*  MG 1.6*  --  1.6*  --   --   --  PHOS  --  1.6*  --  1.9* 2.6 2.6   Liver Function Tests: Recent Labs  Lab 08/11/23 0003 08/11/23 0419 08/12/23 0545 08/13/23 0508 08/14/23 0538  AST 29 26  --   --   --   ALT 10 11  --   --   --   ALKPHOS 117 107  --   --   --   BILITOT 0.4 0.3  --   --   --   PROT 6.5 5.3*  --   --   --   ALBUMIN 2.0* 1.7* 1.5* 1.5* 1.6*   CBG: Recent Labs  Lab 08/13/23 2005 08/13/23 2338 08/14/23 0352 08/14/23 0806 08/14/23 1126  GLUCAP 162* 198* 117* 152* 125*    Discharge time spent: greater than 30 minutes.  Signed: Debarah Crape, DO [facility] 08/14/2023

## 2023-08-14 NOTE — Plan of Care (Signed)
Pt A/O x 4, Orders to DC in, AVS papers given, education provided. All questions unswered. IV removed and intact. HD cath stays in, per MD. Family, belongings with the pt off the unit

## 2023-08-15 NOTE — Progress Notes (Signed)
Late Note Entry- Aug 15, 2023  Pt was d/c yesterday. Contacted [facility] RN this morning to be advised of pt's d/c date and that pt should resume today.   Olivia Canter  Renal Navigator [phone]

## 2023-08-25 ENCOUNTER — Emergency Department (HOSPITAL_COMMUNITY): Payer: 59

## 2023-08-25 ENCOUNTER — Encounter (HOSPITAL_COMMUNITY): Payer: Self-pay | Admitting: Emergency Medicine

## 2023-08-25 ENCOUNTER — Emergency Department (HOSPITAL_COMMUNITY)
Admission: EM | Admit: 2023-08-25 | Discharge: 2023-08-25 | Discharge status: Against Medical Advice | Payer: 59 | Attending: Emergency Medicine | Admitting: Emergency Medicine

## 2023-08-25 ENCOUNTER — Other Ambulatory Visit: Payer: Self-pay

## 2023-08-25 DIAGNOSIS — E1122 Type 2 diabetes mellitus with diabetic chronic kidney disease: Secondary | ICD-10-CM | POA: Insufficient documentation

## 2023-08-25 DIAGNOSIS — Z79899 Other long term (current) drug therapy: Secondary | ICD-10-CM | POA: Insufficient documentation

## 2023-08-25 DIAGNOSIS — Z20822 Contact with and (suspected) exposure to covid-19: Secondary | ICD-10-CM | POA: Diagnosis not present

## 2023-08-25 DIAGNOSIS — Z7982 Long term (current) use of aspirin: Secondary | ICD-10-CM | POA: Insufficient documentation

## 2023-08-25 DIAGNOSIS — J189 Pneumonia, unspecified organism: Secondary | ICD-10-CM | POA: Diagnosis not present

## 2023-08-25 DIAGNOSIS — I120 Hypertensive chronic kidney disease with stage 5 chronic kidney disease or end stage renal disease: Secondary | ICD-10-CM | POA: Insufficient documentation

## 2023-08-25 DIAGNOSIS — F1410 Cocaine abuse, uncomplicated: Secondary | ICD-10-CM | POA: Insufficient documentation

## 2023-08-25 DIAGNOSIS — R0600 Dyspnea, unspecified: Secondary | ICD-10-CM | POA: Diagnosis present

## 2023-08-25 DIAGNOSIS — Z992 Dependence on renal dialysis: Secondary | ICD-10-CM | POA: Diagnosis not present

## 2023-08-25 DIAGNOSIS — G20C Parkinsonism, unspecified: Secondary | ICD-10-CM | POA: Insufficient documentation

## 2023-08-25 DIAGNOSIS — R000 Tachycardia, unspecified: Secondary | ICD-10-CM | POA: Diagnosis not present

## 2023-08-25 DIAGNOSIS — F17210 Nicotine dependence, cigarettes, uncomplicated: Secondary | ICD-10-CM | POA: Diagnosis not present

## 2023-08-25 DIAGNOSIS — N186 End stage renal disease: Secondary | ICD-10-CM | POA: Insufficient documentation

## 2023-08-25 LAB — CBC WITH DIFFERENTIAL/PLATELET
Abs Immature Granulocytes: 0.01 10*3/uL (ref 0.00–0.07)
Basophils Absolute: 0 10*3/uL (ref 0.0–0.1)
Basophils Relative: 1 %
Eosinophils Absolute: 0.3 10*3/uL (ref 0.0–0.5)
Eosinophils Relative: 3 %
HCT: 26.5 % — ABNORMAL LOW (ref 36.0–46.0)
Hemoglobin: 8.4 g/dL — ABNORMAL LOW (ref 12.0–15.0)
Immature Granulocytes: 0 %
Lymphocytes Relative: 30 %
Lymphs Abs: 2.6 10*3/uL (ref 0.7–4.0)
MCH: 29.1 pg (ref 26.0–34.0)
MCHC: 31.7 g/dL (ref 30.0–36.0)
MCV: 91.7 fL (ref 80.0–100.0)
Monocytes Absolute: 1.1 10*3/uL — ABNORMAL HIGH (ref 0.1–1.0)
Monocytes Relative: 13 %
Neutro Abs: 4.7 10*3/uL (ref 1.7–7.7)
Neutrophils Relative %: 53 %
Platelets: 234 10*3/uL (ref 150–400)
RBC: 2.89 MIL/uL — ABNORMAL LOW (ref 3.87–5.11)
RDW: 14.5 % (ref 11.5–15.5)
WBC: 8.8 10*3/uL (ref 4.0–10.5)
nRBC: 0 % (ref 0.0–0.2)

## 2023-08-25 LAB — COMPREHENSIVE METABOLIC PANEL
ALT: 9 U/L (ref 0–44)
AST: 17 U/L (ref 15–41)
Albumin: 2 g/dL — ABNORMAL LOW (ref 3.5–5.0)
Alkaline Phosphatase: 101 U/L (ref 38–126)
Anion gap: 13 (ref 5–15)
BUN: 9 mg/dL (ref 6–20)
CO2: 24 mmol/L (ref 22–32)
Calcium: 8.1 mg/dL — ABNORMAL LOW (ref 8.9–10.3)
Chloride: 97 mmol/L — ABNORMAL LOW (ref 98–111)
Creatinine, Ser: 4.06 mg/dL — ABNORMAL HIGH (ref 0.44–1.00)
GFR, Estimated: 12 mL/min — ABNORMAL LOW (ref >60)
Glucose, Bld: 121 mg/dL — ABNORMAL HIGH (ref 70–99)
Potassium: 3.1 mmol/L — ABNORMAL LOW (ref 3.5–5.1)
Sodium: 134 mmol/L — ABNORMAL LOW (ref 135–145)
Total Bilirubin: 0.7 mg/dL (ref 0.0–1.2)
Total Protein: 5.9 g/dL — ABNORMAL LOW (ref 6.5–8.1)

## 2023-08-25 LAB — I-STAT VENOUS BLOOD GAS, ED
Acid-Base Excess: 7 mmol/L — ABNORMAL HIGH (ref 0.0–2.0)
Bicarbonate: 29.8 mmol/L — ABNORMAL HIGH (ref 20.0–28.0)
Calcium, Ion: 1.02 mmol/L — ABNORMAL LOW (ref 1.15–1.40)
HCT: 28 % — ABNORMAL LOW (ref 36.0–46.0)
Hemoglobin: 9.5 g/dL — ABNORMAL LOW (ref 12.0–15.0)
O2 Saturation: 99 %
Potassium: 3.1 mmol/L — ABNORMAL LOW (ref 3.5–5.1)
Sodium: 134 mmol/L — ABNORMAL LOW (ref 135–145)
TCO2: 31 mmol/L (ref 22–32)
pCO2, Ven: 35.4 mm[Hg] — ABNORMAL LOW (ref 44–60)
pH, Ven: 7.533 — ABNORMAL HIGH (ref 7.25–7.43)
pO2, Ven: 131 mm[Hg] — ABNORMAL HIGH (ref 32–45)

## 2023-08-25 LAB — RESP PANEL BY RT-PCR (RSV, FLU A&B, COVID)  RVPGX2
Influenza A by PCR: NEGATIVE
Influenza B by PCR: NEGATIVE
Resp Syncytial Virus by PCR: NEGATIVE
SARS Coronavirus 2 by RT PCR: NEGATIVE

## 2023-08-25 LAB — LIPASE, BLOOD: Lipase: 21 U/L (ref 11–51)

## 2023-08-25 LAB — TROPONIN I (HIGH SENSITIVITY)
Troponin I (High Sensitivity): 14 ng/L (ref <18)
Troponin I (High Sensitivity): 14 ng/L (ref <18)

## 2023-08-25 MED ORDER — IPRATROPIUM-ALBUTEROL 0.5-2.5 (3) MG/3ML IN SOLN
3.0000 mL | Freq: Once | RESPIRATORY_TRACT | Status: AC
  Administered 2023-08-25: 3 mL via RESPIRATORY_TRACT
  Filled 2023-08-25: qty 3

## 2023-08-25 MED ORDER — ALBUTEROL SULFATE HFA 108 (90 BASE) MCG/ACT IN AERS: 1.0000 | INHALATION_SPRAY | Freq: Four times a day (QID) | RESPIRATORY_TRACT | 0 refills | Status: DC | PRN

## 2023-08-25 MED ORDER — OXYMETAZOLINE HCL 0.05 % NA SOLN
1.0000 | Freq: Two times a day (BID) | NASAL | 0 refills | Status: AC
Start: 2023-08-25 — End: 2023-08-28

## 2023-08-25 MED ORDER — SODIUM CHLORIDE 0.9 % IV SOLN
1.0000 g | Freq: Once | INTRAVENOUS | Status: AC
  Administered 2023-08-25: 1 g via INTRAVENOUS
  Filled 2023-08-25: qty 10

## 2023-08-25 MED ORDER — LEVOFLOXACIN 250 MG PO TABS: 250.0000 mg | ORAL_TABLET | Freq: Two times a day (BID) | ORAL | 0 refills | Status: AC

## 2023-08-25 MED ORDER — SODIUM CHLORIDE 0.9 % IV SOLN
500.0000 mg | Freq: Once | INTRAVENOUS | Status: AC
  Administered 2023-08-25: 500 mg via INTRAVENOUS
  Filled 2023-08-25: qty 5

## 2023-08-25 MED ORDER — SODIUM CHLORIDE 0.9 % IV BOLUS
500.0000 mL | Freq: Once | INTRAVENOUS | Status: AC
  Administered 2023-08-25: 500 mL via INTRAVENOUS

## 2023-08-25 MED ORDER — GUAIFENESIN-DM 100-10 MG/5ML PO SYRP: 5.0000 mL | ORAL_SOLUTION | ORAL | 0 refills | Status: DC | PRN

## 2023-08-25 NOTE — Discharge Instructions (Addendum)
 Please stop using cocaine  Please complete dialysis at your normal schedule  It was a pleasure caring for you today in the emergency department.  Please return to the emergency department for any worsening or worrisome symptoms.

## 2023-08-25 NOTE — ED Provider Notes (Signed)
 [place] EMERGENCY DEPARTMENT AT [facility] Provider Note  CSN: [id number] Arrival date & time: 08/25/23 0028  Chief Complaint(s) Respiratory Distress  HPI Carol [name] is a 63 y.o. female with past medical history as below, significant for hepatitis, chronic back pain, ESRD on HD Monday Wednesday Friday, hypertension, Parkinson's disease who presents to the ED with complaint of dyspnea.   She reports sudden onset difficulty breathing this evening after smoking crack cocaine .  She does not use home oxygen .  She is ESRD on HD Monday Wednesday Friday without any missed dialysis sessions, last session was on Friday.  Reports recent URI type symptoms, body aches, chills, cough congestion.  She is having chest pain/back pain. no vomiting or abdominal pain.  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   Chronic back pain    Depression    Diabetes mellitus    ESRD (end stage renal disease) on dialysis [facility])    M,W,F   GERD (gastroesophageal reflux disease)    Heart murmur    Hypertension    NSTEMI (non-ST elevated myocardial infarction) (HCC) 12/20/2021   Palpitations    Parkinson's disease (HCC)    Patient Active Problem List   Diagnosis Date Noted   Hypokalemia 08/11/2023   History of COPD 08/11/2023   History of anemia due to chronic kidney disease 08/11/2023   End stage renal disease (HCC) 08/03/2023   Overweight (BMI 25.0-29.9) 08/03/2023   Chest pain 07/31/2023   Hypertensive emergency 07/31/2023   Anemia of chronic disease 07/31/2023   Chronic pain 07/31/2023   Polysubstance abuse (HCC) 07/31/2023   COPD (chronic obstructive pulmonary disease) (HCC) 03/27/2023   Chronic anemia 03/27/2023    Acute on chronic systolic (congestive) heart failure (HCC) 03/08/2023   Volume overload 03/07/2023   Heme positive stool 11/06/2022   Anemia due to chronic blood loss 11/06/2022   Colon cancer screening 11/03/2022   Hypoglycemia 11/02/2022   MDD (major depressive disorder), recurrent episode, moderate (HCC) 11/02/2022   Abnormal barium swallow 11/02/2022   Esophageal stricture 11/02/2022   Esophageal dysmotility 11/02/2022   Antiplatelet or antithrombotic long-term use 11/02/2022   Chronic diastolic CHF (congestive heart failure) (HCC) 11/01/2022   Dysphagia 11/01/2022   Acute on chronic diastolic CHF (congestive heart failure) (HCC) 05/09/2022   Metabolic acidosis, increased anion gap 05/09/2022   Acute on chronic diastolic (congestive) heart failure (HCC) 03/22/2022   COPD not affecting current episode of care 03/22/2022   Class 2 obesity due to excess calories with body mass index (BMI) of 37.0 to 37.9 in adult 03/22/2022   Hypomagnesemia 03/15/2022   CHF (congestive heart failure) (HCC) 03/14/2022   Acute metabolic encephalopathy 03/10/2022   borderline Prolonged QT interval 01/22/2022   Elevated troponin 01/22/2022   Thyroid  nodule 01/22/2022   Cocaine  use disorder, mild, abuse (HCC) 01/02/2022   Diabetic hypoglycemia (HCC) 12/31/2021   CKD (chronic kidney disease) stage 4, GFR 15-29 ml/min (HCC) 12/31/2021   CAD in native artery 12/24/2021   Anxiety with depression 12/24/2021   Pure hypercholesterolemia    DM2 (diabetes mellitus, type 2) (HCC) 01/29/2014   Essential hypertension 01/29/2014   Closed jaw fracture (HCC) 01/29/2014   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 Authorizing Provider  albuterol  (VENTOLIN  HFA) 108 (90 Base) MCG/ACT inhaler Inhale 1-2 puffs into the lungs every 6 (six) hours as needed for wheezing or shortness of breath. 08/25/23  Yes Elnor Savant A, DO  guaiFENesin -dextromethorphan (ROBITUSSIN DM) 100-10 MG/5ML syrup Take 5 mLs by mouth every 4 (four) hours as needed for cough. 08/25/23  Yes Elnor Savant A, DO  levofloxacin  (LEVAQUIN ) 250 MG tablet Take 1 tablet (250 mg total) by mouth 2 (two) times daily for 7 days. 08/25/23 09/01/23 Yes Elnor Savant LABOR, DO  oxymetazoline  (AFRIN NASAL SPRAY) 0.05 % nasal spray Place 1 spray into both nostrils 2 (two) times daily for 3 days. 08/25/23 08/28/23 Yes Elnor Savant LABOR, DO  albuterol  (PROVENTIL  HFA;VENTOLIN  HFA) 108 (90 Base) MCG/ACT inhaler Inhale 2 puffs into the lungs every 4 (four) hours as needed for wheezing or shortness of breath.    [provider]  amLODipine  (NORVASC ) 10 MG tablet Take 1 tablet (10 mg total) by mouth daily. 11/01/22   Glena Harlene HERO, FNP  aspirin  EC 81 MG tablet Take 1 tablet (81 mg total) by mouth daily. Swallow whole. 05/11/22   Lemon Harlene, MD  atorvastatin  (LIPITOR ) 80 MG tablet Take 1 tablet (80 mg total) by mouth daily. 03/11/23   Lue Elsie BROCKS, MD  budesonide -formoterol  [facility]) 160-4.5 MCG/ACT inhaler Inhale 2 puffs into the lungs in the morning and at bedtime. 11/09/21   [provider]  cetirizine (ZYRTEC) 10 MG tablet Take 10 mg by mouth daily as needed for allergies.    [provider]  Darbepoetin Alfa  (ARANESP ) 60 MCG/0.3ML SOSY injection Inject 0.3 mLs (60 mcg total) into the skin every Thursday at 6pm. 08/08/23   Dezii, Alexandra, DO  eszopiclone 3 MG TABS Take 3 mg by mouth at bedtime. Take immediately before bedtime    [provider]  gabapentin  (NEURONTIN ) 100 MG capsule Take 1 capsule (100 mg total) by mouth 2 (two) times daily. 08/14/23 09/13/23  Dezii, Alexandra, DO  glucagon  1 MG injection Follow package directions for low blood sugar. 08/14/23 08/13/24  Dezii, Alexandra, DO  isosorbide -hydrALAZINE  (BIDIL ) 20-37.5 MG tablet Take 2 tablets by mouth 3 (three) times daily. 08/14/23 09/13/23  Dezii, Alexandra, DO   naloxone  (NARCAN ) nasal spray 4 mg/0.1 mL If concern for opioid overdose, spray in nostril. Call 911. 08/14/23   Dezii, Alexandra, DO  naphazoline-pheniramine (NAPHCON-A) 0.025-0.3 % ophthalmic solution Place 2 drops into both eyes 2 (two) times daily.    [provider]  nitroGLYCERIN  (NITROSTAT ) 0.4 MG SL tablet Place 1 tablet (0.4 mg total) under the tongue every 5 (five) minutes as needed for chest pain. 05/11/22 11/01/23  Lemon Harlene, MD  pantoprazole  (PROTONIX ) 40 MG tablet Take 1 tablet (40 mg total) by mouth daily at 12 noon. Patient taking differently: Take 40 mg by mouth at bedtime. 05/11/22   Lemon Harlene, MD  triamcinolone  ointment (KENALOG ) 0.1 % 1 Application 2 (two) times daily as needed. 06/29/23   [provider]  Past Surgical History Past Surgical History:  Procedure Laterality Date   AV FISTULA PLACEMENT Left 08/01/2023   Procedure: LEFT ARM BRACHIOBASILIC FISTULA CREATION;  Surgeon: Pearline Norman RAMAN, MD;  Location: [facility] OR;  Service: Vascular;  Laterality: Left;   BALLOON DILATION N/A 11/06/2022   Procedure: BALLOON DILATION;  Surgeon: Shila Gustav GAILS, MD;  Location: [facility] ENDOSCOPY;  Service: Gastroenterology;  Laterality: N/A;   CARPAL TUNNEL RELEASE     COLONOSCOPY WITH PROPOFOL  N/A 11/06/2022   Procedure: COLONOSCOPY WITH PROPOFOL ;  Surgeon: Shila Gustav GAILS, MD;  Location: [facility] ENDOSCOPY;  Service: Gastroenterology;  Laterality: N/A;   ESOPHAGOGASTRODUODENOSCOPY (EGD) WITH PROPOFOL  N/A 11/06/2022   Procedure: ESOPHAGOGASTRODUODENOSCOPY (EGD) WITH PROPOFOL ;  Surgeon: Shila Gustav GAILS, MD;  Location: [facility] ENDOSCOPY;  Service: Gastroenterology;  Laterality: N/A;   INSERTION OF DIALYSIS CATHETER Right 08/01/2023   Procedure: INSERTION OF TUNNELED DIALYSIS CATHETER;  Surgeon: Pearline Norman RAMAN, MD;  Location: [facility] OR;  Service:  Vascular;  Laterality: Right;   IR FLUORO GUIDE CV LINE RIGHT  01/26/2022   IR US  GUIDE VASC ACCESS RIGHT  01/26/2022   LEFT HEART CATH AND CORONARY ANGIOGRAPHY N/A 12/20/2021   Procedure: LEFT HEART CATH AND CORONARY ANGIOGRAPHY;  Surgeon: Verlin Lonni BIRCH, MD;  Location: [facility] INVASIVE CV LAB;  Service: Cardiovascular;  Laterality: N/A;   OPEN REDUCTION INTERNAL FIXATION (ORIF) FINGER WITH RADIAL BONE GRAFT Right 09/12/2015   Procedure: RIGHT MIDDLE FINGER CLOSED REDUCTION AND PINNING;  Surgeon: Prentice Pagan, MD;  Location: [facility] OR;  Service: Orthopedics;  Laterality: Right;   TONSILLECTOMY     TUBAL LIGATION     Family History Family History  Problem Relation Age of Onset   Diabetes Mother    Kidney disease Mother    Seizures Father    Hypertension Father    Stroke Father    Asthma Other     Social History Social History   Tobacco Use   Smoking status: Some Days    Current packs/day: 0.25    Average packs/day: 0.3 packs/day for 3.0 years (0.8 ttl pk-yrs)    Types: Cigarettes    Passive exposure: Current   Smokeless tobacco: Never  Vaping Use   Vaping status: Never Used  Substance Use Topics   Alcohol use: Yes    Alcohol/week: 0.0 standard drinks of alcohol    Comment: occasional   Drug use: Yes    Types: Cocaine    Allergies Penicillins and Ultram  [tramadol ]  Review of Systems Review of Systems  Constitutional:  Positive for chills. Negative for fever.  HENT:  Negative for congestion.   Respiratory:  Positive for cough and shortness of breath.   Cardiovascular:  Positive for chest pain and leg swelling. Negative for palpitations.  Gastrointestinal:  Negative for abdominal pain, nausea and vomiting.  Musculoskeletal:  Positive for arthralgias.  Neurological:  Negative for syncope.  All other systems reviewed and are negative.   Physical Exam Vital Signs  I have reviewed the triage vital signs BP (!) 146/69   Pulse 88   Temp 97.6 F (36.4 C) (Oral)   Resp 16    SpO2 98%  Physical Exam Vitals and nursing note reviewed.  Constitutional:      General: She is in acute distress.     Appearance: Normal appearance.  HENT:     Head: Normocephalic and atraumatic.     Right Ear: External ear normal.     Left Ear: External ear normal.     Nose: Nose normal.  Mouth/Throat:     Mouth: Mucous membranes are moist.  Eyes:     General: No scleral icterus.       Right eye: No discharge.        Left eye: No discharge.  Cardiovascular:     Rate and Rhythm: Regular rhythm. Tachycardia present.     Pulses: Normal pulses.     Heart sounds: Normal heart sounds.  Pulmonary:     Effort: Tachypnea, accessory muscle usage and respiratory distress present.     Breath sounds: Decreased air movement present. No stridor.     Comments: Breath sounds coarse b/l Chest:    Abdominal:     General: Abdomen is flat. There is no distension.     Palpations: Abdomen is soft.     Tenderness: There is no abdominal tenderness.  Musculoskeletal:     Cervical back: No rigidity.     Right lower leg: Edema present.     Left lower leg: Edema present.  Skin:    General: Skin is warm and dry.     Capillary Refill: Capillary refill takes less than 2 seconds.       Neurological:     Mental Status: She is alert.  Psychiatric:        Mood and Affect: Mood normal.        Behavior: Behavior normal. Behavior is cooperative.     ED Results and Treatments Labs (all labs ordered are listed, but only abnormal results are displayed) Labs Reviewed  CBC WITH DIFFERENTIAL/PLATELET - Abnormal; Notable for the following components:      Result Value   RBC 2.89 (*)    Hemoglobin 8.4 (*)    HCT 26.5 (*)    Monocytes Absolute 1.1 (*)    All other components within normal limits  COMPREHENSIVE METABOLIC PANEL - Abnormal; Notable for the following components:   Sodium 134 (*)    Potassium 3.1 (*)    Chloride 97 (*)    Glucose, Bld 121 (*)    Creatinine, Ser 4.06 (*)     Calcium  8.1 (*)    Total Protein 5.9 (*)    Albumin  2.0 (*)    GFR, Estimated 12 (*)    All other components within normal limits  I-STAT VENOUS BLOOD GAS, ED - Abnormal; Notable for the following components:   pH, Ven 7.533 (*)    pCO2, Ven 35.4 (*)    pO2, Ven 131 (*)    Bicarbonate 29.8 (*)    Acid-Base Excess 7.0 (*)    Sodium 134 (*)    Potassium 3.1 (*)    Calcium , Ion 1.02 (*)    HCT 28.0 (*)    Hemoglobin 9.5 (*)    All other components within normal limits  RESP PANEL BY RT-PCR (RSV, FLU A&B, COVID)  RVPGX2  LIPASE, BLOOD  URINALYSIS, ROUTINE W REFLEX MICROSCOPIC  TROPONIN I (HIGH SENSITIVITY)  TROPONIN I (HIGH SENSITIVITY)  Radiology DG Chest Portable 1 View Result Date: 08/25/2023 CLINICAL DATA:  Tachypnea and respiratory distress. EXAM: PORTABLE CHEST 1 VIEW COMPARISON:  08/10/2023 FINDINGS: Right IJ CVC tip in the low SVC. Stable cardiomediastinal silhouette. Aortic atherosclerotic calcification. Interstitial coarsening has decreased compared to 08/10/2023. Bibasilar atelectasis or infiltrates. No pleural effusion or pneumothorax. IMPRESSION: Decreased interstitial coarsening compared to 08/10/2023. Bibasilar atelectasis or infiltrates. Electronically Signed   By: Norman Gatlin M.D.   On: 08/25/2023 01:01    Pertinent labs & imaging results that were available during my care of the patient were reviewed by me and considered in my medical decision making (see MDM for details).  Medications Ordered in ED Medications  sodium chloride  0.9 % bolus 500 mL (0 mLs Intravenous Stopped 08/25/23 0139)  ipratropium-albuterol  (DUONEB) 0.5-2.5 (3) MG/3ML nebulizer solution 3 mL (3 mLs Nebulization Given 08/25/23 0059)  cefTRIAXone  (ROCEPHIN ) 1 g in sodium chloride  0.9 % 100 mL IVPB (0 g Intravenous Stopped 08/25/23 0209)  azithromycin  (ZITHROMAX ) 500 mg in sodium  chloride 0.9 % 250 mL IVPB (0 mg Intravenous Stopped 08/25/23 0240)                                                                                                                                     Procedures Procedures  (including critical care time)  Medical Decision Making / ED Course    Medical Decision Making:    Shanee Batch is a 63 y.o. female  with past medical history as below, significant for hepatitis, chronic back pain, ESRD on HD Monday Wednesday Friday, hypertension, Parkinson's disease who presents to the ED with complaint of dyspnea. The complaint involves an extensive differential diagnosis and also carries with it a high risk of complications and morbidity.  Serious etiology was considered. Ddx includes but is not limited to: In my evaluation of this patient's dyspnea my DDx includes, but is not limited to, pneumonia, pulmonary embolism, pneumothorax, pulmonary edema, metabolic acidosis, asthma, COPD, cardiac cause, anemia, anxiety, etc.    Complete initial physical exam performed, notably the patient hemodynamically stable, no hypoxia increased work of breathing noted.    Reviewed and confirmed nursing documentation for past medical history, family history, social history.  Vital signs reviewed.      Clinical Course as of 08/25/23 [id number]  [place] Aug 25, 2023  0053 Able to de-escalate from NRB to RA, 100% on RA, wob improved; hold off NIPPV  [SG]  0124 Hemoglobin(!): 8.4 Similar to prior. [SG]  0307 Creatinine(!): 4.06 Similar to prior, ESRD on hd [SG]  0539 Feeling better, on RA, workup stable. Symptoms possibly provoked by crack cocaine  use just prior to onset [SG]    Clinical Course User Index [SG] Elnor Jayson LABOR, DO    Brief summary: 63 year old female history of ESRD on HD Monday Wednesday Friday without any missed dialysis sessions also crack cocaine  abuse here with dyspnea.  Patient reports that she  smoked crack cocaine  just prior to the onset of  her dyspnea.  On arrival increased work of breathing but no hypoxia.  Able to be de-escalated off of supplemental oxygen .  DuoNebs, Solu-Medrol , mag sulfate.  Symptoms greatly improved.  Labs reviewed and are stable.  Imaging concerning for possible pneumonia, she reports URI symptoms over the past few days including intermittently productive cough with clear or white sputum.  No fevers.  Will cover with antibiotics for possible pneumonia, allergy list reviewed.  start Levaquin .  Encouraged cessation of crack cocaine .  Advised her to follow with her PCP  She is not septic, no hypoxia, feeling much better on recheck  Not interested in cocaine  cessation at this time or tobacco cessation  The patient improved significantly and was discharged in stable condition. Detailed discussions were had with the patient regarding current findings, and need for close f/u with PCP or on call doctor. The patient has been instructed to return immediately if the symptoms worsen in any way for re-evaluation. Patient verbalized understanding and is in agreement with current care plan. All questions answered prior to discharge.               Additional history obtained: -Additional history obtained from ems / family -External records from outside source obtained and reviewed including: Chart review including previous notes, labs, imaging, consultation notes including  Allergy list, prior admission, prior labs and imaging   Lab Tests: -I ordered, reviewed, and interpreted labs.   The pertinent results include:     Notable for labs stable  EKG   EKG Interpretation Date/Time:  Sunday August 25 2023 00:38:32 EST Ventricular Rate:  91 PR Interval:  138 QRS Duration:  105 QT Interval:  419 QTC Calculation: 516 R Axis:   51  Text Interpretation: Sinus rhythm Probable left atrial enlargement Prolonged QT interval Confirmed by Elnor Savant ([id number]) on 08/25/2023 12:48:20 AM         Imaging Studies  ordered: I ordered imaging studies including x-ray chest I independently visualized the following imaging with scope of interpretation limited to determining acute life threatening conditions related to emergency care; findings noted above I independently visualized and interpreted imaging. I agree with the radiologist interpretation   Medicines ordered and prescription drug management: Meds ordered this encounter  Medications   sodium chloride  0.9 % bolus 500 mL   ipratropium-albuterol  (DUONEB) 0.5-2.5 (3) MG/3ML nebulizer solution 3 mL   cefTRIAXone  (ROCEPHIN ) 1 g in sodium chloride  0.9 % 100 mL IVPB    Antibiotic Indication::   CAP   azithromycin  (ZITHROMAX ) 500 mg in sodium chloride  0.9 % 250 mL IVPB    Antibiotic Indication::   CAP   levofloxacin  (LEVAQUIN ) 250 MG tablet    Sig: Take 1 tablet (250 mg total) by mouth 2 (two) times daily for 7 days.    Dispense:  14 tablet    Refill:  0   oxymetazoline  (AFRIN NASAL SPRAY) 0.05 % nasal spray    Sig: Place 1 spray into both nostrils 2 (two) times daily for 3 days.    Dispense:  15 mL    Refill:  0   guaiFENesin -dextromethorphan (ROBITUSSIN DM) 100-10 MG/5ML syrup    Sig: Take 5 mLs by mouth every 4 (four) hours as needed for cough.    Dispense:  118 mL    Refill:  0   albuterol  (VENTOLIN  HFA) 108 (90 Base) MCG/ACT inhaler    Sig: Inhale 1-2 puffs into the lungs every 6 (six) hours  as needed for wheezing or shortness of breath.    Dispense:  1 each    Refill:  0    -I have reviewed the patients home medicines and have made adjustments as needed   Consultations Obtained: na   Cardiac Monitoring: The patient was maintained on a cardiac monitor.  I personally viewed and interpreted the cardiac monitored which showed an underlying rhythm of: nsr Continuous pulse oximetry interpreted by myself, 100% on RA.    Social Determinants of Health:  Diagnosis or treatment significantly limited by social determinants of health: current  smoker and polysubstance abuse Counseled patient for approximately 3 minutes regarding smoking cessation. Discussed risks of smoking and how they applied and affected their visit here today. Patient not ready to quit at this time, however will follow up with their primary doctor when they are.   CPT code: 00593: intermediate counseling for smoking cessation     Reevaluation: After the interventions noted above, I reevaluated the patient and found that they have improved  Co morbidities that complicate the patient evaluation  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   Chronic back pain    Depression    Diabetes mellitus    ESRD (end stage renal disease) on dialysis [facility])    M,W,F   GERD (gastroesophageal reflux disease)    Heart murmur    Hypertension    NSTEMI (non-ST elevated myocardial infarction) (HCC) 12/20/2021   Palpitations    Parkinson's disease (HCC)       Dispostion: Disposition decision including need for hospitalization was considered, and patient discharged from emergency department.    Final Clinical Impression(s) / ED Diagnoses Final diagnoses:  Cocaine  abuse (HCC)  Dyspnea, unspecified type  Community acquired pneumonia, unspecified laterality  ESRD on hemodialysis (HCC)        Elnor Jayson LABOR, DO 08/25/23 [phone]

## 2023-08-25 NOTE — ED Triage Notes (Signed)
 Pt BIB EMS for respiratory distress. Tachypnea and tripoding on arrival. 125 mg given en route by EMS. On Dialysis. SOB with wheezing x 3 days. Duoneb x 1. Magnesium 2 g infusing.

## 2023-08-25 NOTE — ED Notes (Signed)
 Pt admits to "smoking crack" tonight when symptoms started. Dr. Wallace Cullens notified at bedside.

## 2023-08-26 ENCOUNTER — Telehealth (HOSPITAL_COMMUNITY): Payer: Self-pay

## 2023-08-26 NOTE — Telephone Encounter (Signed)
 Called and left patient a voice message to confirm/remind patient of their appointment [facility] on [facility].   And to bring in all medications and/or complete list.

## 2023-08-27 ENCOUNTER — Encounter (HOSPITAL_COMMUNITY): Payer: 59

## 2023-09-06 ENCOUNTER — Encounter (HOSPITAL_COMMUNITY): Payer: 59

## 2023-09-06 ENCOUNTER — Other Ambulatory Visit: Payer: Self-pay

## 2023-09-06 DIAGNOSIS — N184 Chronic kidney disease, stage 4 (severe): Secondary | ICD-10-CM

## 2023-09-13 ENCOUNTER — Ambulatory Visit (HOSPITAL_COMMUNITY): Payer: 59 | Attending: Vascular Surgery

## 2023-09-28 ENCOUNTER — Encounter (HOSPITAL_COMMUNITY): Payer: Self-pay | Admitting: Emergency Medicine

## 2023-09-28 ENCOUNTER — Emergency Department (HOSPITAL_COMMUNITY)
Admission: EM | Admit: 2023-09-28 | Discharge: 2023-09-28 | Disposition: A | Discharge status: Home / Self Care | Payer: 59 | Attending: Emergency Medicine | Admitting: Emergency Medicine

## 2023-09-28 ENCOUNTER — Emergency Department (HOSPITAL_COMMUNITY): Payer: 59

## 2023-09-28 DIAGNOSIS — N186 End stage renal disease: Secondary | ICD-10-CM | POA: Diagnosis not present

## 2023-09-28 DIAGNOSIS — F1410 Cocaine abuse, uncomplicated: Secondary | ICD-10-CM

## 2023-09-28 DIAGNOSIS — R0989 Other specified symptoms and signs involving the circulatory and respiratory systems: Secondary | ICD-10-CM | POA: Insufficient documentation

## 2023-09-28 DIAGNOSIS — Z992 Dependence on renal dialysis: Secondary | ICD-10-CM | POA: Insufficient documentation

## 2023-09-28 DIAGNOSIS — R072 Precordial pain: Secondary | ICD-10-CM

## 2023-09-28 DIAGNOSIS — R059 Cough, unspecified: Secondary | ICD-10-CM | POA: Insufficient documentation

## 2023-09-28 DIAGNOSIS — G8929 Other chronic pain: Secondary | ICD-10-CM | POA: Insufficient documentation

## 2023-09-28 DIAGNOSIS — R0789 Other chest pain: Secondary | ICD-10-CM | POA: Diagnosis present

## 2023-09-28 DIAGNOSIS — Z79899 Other long term (current) drug therapy: Secondary | ICD-10-CM | POA: Diagnosis not present

## 2023-09-28 DIAGNOSIS — I2510 Atherosclerotic heart disease of native coronary artery without angina pectoris: Secondary | ICD-10-CM | POA: Insufficient documentation

## 2023-09-28 DIAGNOSIS — I517 Cardiomegaly: Secondary | ICD-10-CM | POA: Diagnosis not present

## 2023-09-28 DIAGNOSIS — Z7982 Long term (current) use of aspirin: Secondary | ICD-10-CM | POA: Insufficient documentation

## 2023-09-28 DIAGNOSIS — Z20822 Contact with and (suspected) exposure to covid-19: Secondary | ICD-10-CM | POA: Insufficient documentation

## 2023-09-28 DIAGNOSIS — D649 Anemia, unspecified: Secondary | ICD-10-CM

## 2023-09-28 LAB — TROPONIN I (HIGH SENSITIVITY)
Troponin I (High Sensitivity): 33 ng/L — ABNORMAL HIGH (ref <18)
Troponin I (High Sensitivity): 33 ng/L — ABNORMAL HIGH (ref <18)

## 2023-09-28 LAB — CBC
HCT: 29.8 % — ABNORMAL LOW (ref 36.0–46.0)
Hemoglobin: 9.7 g/dL — ABNORMAL LOW (ref 12.0–15.0)
MCH: 28.8 pg (ref 26.0–34.0)
MCHC: 32.6 g/dL (ref 30.0–36.0)
MCV: 88.4 fL (ref 80.0–100.0)
Platelets: 191 10*3/uL (ref 150–400)
RBC: 3.37 MIL/uL — ABNORMAL LOW (ref 3.87–5.11)
RDW: 14.8 % (ref 11.5–15.5)
WBC: 6.7 10*3/uL (ref 4.0–10.5)
nRBC: 0 % (ref 0.0–0.2)

## 2023-09-28 LAB — BASIC METABOLIC PANEL
Anion gap: 14 (ref 5–15)
BUN: 12 mg/dL (ref 6–20)
CO2: 26 mmol/L (ref 22–32)
Calcium: 7.9 mg/dL — ABNORMAL LOW (ref 8.9–10.3)
Chloride: 95 mmol/L — ABNORMAL LOW (ref 98–111)
Creatinine, Ser: 3.65 mg/dL — ABNORMAL HIGH (ref 0.44–1.00)
GFR, Estimated: 14 mL/min — ABNORMAL LOW (ref >60)
Glucose, Bld: 130 mg/dL — ABNORMAL HIGH (ref 70–99)
Potassium: 3.3 mmol/L — ABNORMAL LOW (ref 3.5–5.1)
Sodium: 135 mmol/L (ref 135–145)

## 2023-09-28 LAB — RESP PANEL BY RT-PCR (RSV, FLU A&B, COVID)  RVPGX2
Influenza A by PCR: NEGATIVE
Influenza B by PCR: NEGATIVE
Resp Syncytial Virus by PCR: NEGATIVE
SARS Coronavirus 2 by RT PCR: NEGATIVE

## 2023-09-28 LAB — RAPID URINE DRUG SCREEN, HOSP PERFORMED
Amphetamines: NOT DETECTED
Barbiturates: NOT DETECTED
Benzodiazepines: NOT DETECTED
Cocaine: POSITIVE — AB
Opiates: NOT DETECTED
Tetrahydrocannabinol: NOT DETECTED

## 2023-09-28 MED ORDER — ONDANSETRON HCL 4 MG/2ML IJ SOLN
4.0000 mg | Freq: Once | INTRAMUSCULAR | Status: AC
  Administered 2023-09-28: 4 mg via INTRAVENOUS
  Filled 2023-09-28: qty 2

## 2023-09-28 MED ORDER — ACETAMINOPHEN 500 MG PO TABS
1000.0000 mg | ORAL_TABLET | Freq: Once | ORAL | Status: AC
  Administered 2023-09-28: 1000 mg via ORAL
  Filled 2023-09-28: qty 2

## 2023-09-28 MED ORDER — MORPHINE SULFATE (PF) 4 MG/ML IV SOLN
4.0000 mg | Freq: Once | INTRAVENOUS | Status: AC
Start: 2023-09-28 — End: 2023-09-28
  Administered 2023-09-28: 4 mg via INTRAVENOUS
  Filled 2023-09-28: qty 1

## 2023-09-28 MED ORDER — ALUM & MAG HYDROXIDE-SIMETH 200-200-20 MG/5ML PO SUSP
30.0000 mL | Freq: Once | ORAL | Status: AC
Start: 2023-09-28 — End: 2023-09-28
  Administered 2023-09-28: 30 mL via ORAL
  Filled 2023-09-28: qty 30

## 2023-09-28 MED ORDER — FAMOTIDINE 20 MG PO TABS
20.0000 mg | ORAL_TABLET | Freq: Once | ORAL | Status: AC
  Administered 2023-09-28: 20 mg via ORAL
  Filled 2023-09-28: qty 1

## 2023-09-28 NOTE — ED Notes (Signed)
 IV team at bedside

## 2023-09-28 NOTE — ED Notes (Signed)
 Pt given gingerale and crackers

## 2023-09-28 NOTE — Discharge Instructions (Signed)
 It was our pleasure to provide your ER care today - we hope that you feel better.  Avoid any cocaine  use as it is harmful to your physical health and mental well-being. See resource guide attached in terms of accessing inpatient or outpatient substance use treatment programs.   For recent chest pain, follow up closely with cardiologist in the coming week - we made referral - they should contact you with an appointment in the next couple days. If your have not heard from office by Monday afternoon, call for appointment.   Follow up closely with primary care doctor and kidney doctors this coming week.   Return to ER right away if worse, new symptoms, fevers, recurrent or persistent chest pain, trouble breathing, new/severe pain, abdominal pain, persistent vomiting, or other emergency concern.    You were given pain meds in the ER - no driving for the next 6 hours.

## 2023-09-28 NOTE — ED Provider Notes (Signed)
 [place] EMERGENCY DEPARTMENT AT [facility] Provider Note   CSN: [id number] Arrival date & time: 09/28/23  9197     History  Chief Complaint  Patient presents with   Chest Pain    Carol [name] is a 63 y.o. female.  Pt with hx cad, hx esrd/hd mwf (had dialysis yesterday), and cocaine  use disorder, c/o mid chest pain in past day. Symptoms at rest, dull, not radiating, not pleuritic. No associated sob, nv or diaphoresis. Recent non prod cough. No sore throat or runny nose. No fevers. No known covid or flu exposure. Denies abd pain. No gu c/o. No extremity pain or swelling. Notes hx chronic low back pain and chronic pain med use for same - is requesting a dose of pain med now. No new or worsening back pain. No new numbness/weakness.  No trauma/fall.   The history is provided by the patient, a relative, medical records and the EMS personnel. The history is limited by the condition of the patient.  Chest Pain Associated symptoms: back pain and cough   Associated symptoms: no abdominal pain, no fever, no headache, no numbness, no palpitations, no shortness of breath, no vomiting and no weakness        Home Medications Prior to Admission medications   Medication Sig Start Date End Date Taking? Authorizing Provider  albuterol  (PROVENTIL  HFA;VENTOLIN  HFA) 108 (90 Base) MCG/ACT inhaler Inhale 2 puffs into the lungs every 4 (four) hours as needed for wheezing or shortness of breath.    [provider]  albuterol  (VENTOLIN  HFA) 108 (90 Base) MCG/ACT inhaler Inhale 1-2 puffs into the lungs every 6 (six) hours as needed for wheezing or shortness of breath. 08/25/23   Elnor Jayson LABOR, DO  amLODipine  (NORVASC ) 10 MG tablet Take 1 tablet (10 mg total) by mouth daily. 11/01/22   [place], Harlene HERO, FNP  aspirin  EC 81 MG tablet Take 1 tablet (81 mg total) by mouth daily. Swallow whole. 05/11/22   Lemon Harlene, MD  atorvastatin  (LIPITOR ) 80 MG tablet Take 1 tablet (80 mg  total) by mouth daily. 03/11/23   Lue Elsie BROCKS, MD  budesonide -formoterol  [facility]) 160-4.5 MCG/ACT inhaler Inhale 2 puffs into the lungs in the morning and at bedtime. 11/09/21   [provider]  cetirizine (ZYRTEC) 10 MG tablet Take 10 mg by mouth daily as needed for allergies.    [provider]  Darbepoetin Alfa  (ARANESP ) 60 MCG/0.3ML SOSY injection Inject 0.3 mLs (60 mcg total) into the skin every Thursday at 6pm. 08/08/23   Dezii, Alexandra, DO  eszopiclone 3 MG TABS Take 3 mg by mouth at bedtime. Take immediately before bedtime    [provider]  gabapentin  (NEURONTIN ) 100 MG capsule Take 1 capsule (100 mg total) by mouth 2 (two) times daily. 08/14/23 09/13/23  Dezii, Alexandra, DO  glucagon  1 MG injection Follow package directions for low blood sugar. 08/14/23 08/13/24  Dezii, Alexandra, DO  guaiFENesin -dextromethorphan (ROBITUSSIN DM) 100-10 MG/5ML syrup Take 5 mLs by mouth every 4 (four) hours as needed for cough. 08/25/23   Elnor Jayson LABOR, DO  naloxone  (NARCAN ) nasal spray 4 mg/0.1 mL If concern for opioid overdose, spray in nostril. Call 911. 08/14/23   Dezii, Alexandra, DO  naphazoline-pheniramine (NAPHCON-A) 0.025-0.3 % ophthalmic solution Place 2 drops into both eyes 2 (two) times daily.    [provider]  nitroGLYCERIN  (NITROSTAT ) 0.4 MG SL tablet Place 1 tablet (0.4 mg total) under the tongue every 5 (five) minutes as needed  for chest pain. 05/11/22 11/01/23  Lemon Raisin, MD  pantoprazole  (PROTONIX ) 40 MG tablet Take 1 tablet (40 mg total) by mouth daily at 12 noon. Patient taking differently: Take 40 mg by mouth at bedtime. 05/11/22   Lemon Raisin, MD  triamcinolone  ointment (KENALOG ) 0.1 % 1 Application 2 (two) times daily as needed. 06/29/23   [provider]      Allergies    Penicillins and Ultram  [tramadol ]    Review of Systems   Review of Systems  Constitutional:  Negative for chills and fever.  HENT:  Negative for  sore throat.   Eyes:  Negative for redness and visual disturbance.  Respiratory:  Positive for cough. Negative for shortness of breath.   Cardiovascular:  Positive for chest pain. Negative for palpitations and leg swelling.  Gastrointestinal:  Negative for abdominal pain, diarrhea and vomiting.  Genitourinary:  Negative for dysuria and flank pain.  Musculoskeletal:  Positive for back pain. Negative for neck pain.  Skin:  Negative for rash.  Neurological:  Negative for weakness, numbness and headaches.    Physical Exam Updated Vital Signs BP 128/81   Pulse 83   Temp 98.9 F (37.2 C)   Resp (!) 22   Wt 77.1 kg   SpO2 96%   BMI 29.18 kg/m  Physical Exam Vitals and nursing note reviewed.  Constitutional:      Appearance: Normal appearance. She is well-developed.  HENT:     Head: Atraumatic.     Nose: Nose normal.     Mouth/Throat:     Mouth: Mucous membranes are moist.  Eyes:     General: No scleral icterus.    Conjunctiva/sclera: Conjunctivae normal.     Pupils: Pupils are equal, round, and reactive to light.  Neck:     Vascular: No carotid bruit.     Trachea: No tracheal deviation.     Comments: No stiffness or rigidity.  Cardiovascular:     Rate and Rhythm: Normal rate and regular rhythm.     Pulses: Normal pulses.     Heart sounds: Normal heart sounds. No murmur heard.    No friction rub. No gallop.  Pulmonary:     Effort: Pulmonary effort is normal. No respiratory distress.     Breath sounds: Normal breath sounds.     Comments: Right chest HD cath without sign of infection Abdominal:     General: Bowel sounds are normal. There is no distension.     Palpations: Abdomen is soft.     Tenderness: There is no abdominal tenderness.  Genitourinary:    Comments: No cva tenderness.  Musculoskeletal:        General: No swelling or tenderness.     Cervical back: Normal range of motion and neck supple. No rigidity or tenderness. No muscular tenderness.     Right lower  leg: No edema.     Left lower leg: No edema.     Comments: CTLS spine, non tender, aligned, no step off. Mild lumbar muscular tenderness. No sts or skin lesions or erythema.   Skin:    General: Skin is warm and dry.     Findings: No rash.  Neurological:     Mental Status: She is alert.     Comments: Alert, speech normal. Motor/sens grossly intact bil. Stre 5/5. Sens grossly intact.   Psychiatric:        Mood and Affect: Mood normal.     ED Results / Procedures / Treatments   Labs (all  labs ordered are listed, but only abnormal results are displayed) Results for orders placed or performed during the hospital encounter of 09/28/23  Basic metabolic panel   Collection Time: 09/28/23  8:30 AM  Result Value Ref Range   Sodium 135 135 - 145 mmol/L   Potassium 3.3 (L) 3.5 - 5.1 mmol/L   Chloride 95 (L) 98 - 111 mmol/L   CO2 26 22 - 32 mmol/L   Glucose, Bld 130 (H) 70 - 99 mg/dL   BUN 12 6 - 20 mg/dL   Creatinine, Ser 6.34 (H) 0.44 - 1.00 mg/dL   Calcium  7.9 (L) 8.9 - 10.3 mg/dL   GFR, Estimated 14 (L) >60 mL/min   Anion gap 14 5 - 15  Troponin I (High Sensitivity)   Collection Time: 09/28/23  8:30 AM  Result Value Ref Range   Troponin I (High Sensitivity) 33 (H) <18 ng/L  Resp panel by RT-PCR (RSV, Flu A&B, Covid)   Collection Time: 09/28/23  8:47 AM   Specimen: Nasal Swab  Result Value Ref Range   SARS Coronavirus 2 by RT PCR NEGATIVE NEGATIVE   Influenza A by PCR NEGATIVE NEGATIVE   Influenza B by PCR NEGATIVE NEGATIVE   Resp Syncytial Virus by PCR NEGATIVE NEGATIVE  Rapid urine drug screen (hospital performed)   Collection Time: 09/28/23 10:49 AM  Result Value Ref Range   Opiates NONE DETECTED NONE DETECTED   Cocaine  POSITIVE (A) NONE DETECTED   Benzodiazepines NONE DETECTED NONE DETECTED   Amphetamines NONE DETECTED NONE DETECTED   Tetrahydrocannabinol NONE DETECTED NONE DETECTED   Barbiturates NONE DETECTED NONE DETECTED  Troponin I (High Sensitivity)   Collection  Time: 09/28/23 11:41 AM  Result Value Ref Range   Troponin I (High Sensitivity) 33 (H) <18 ng/L  CBC   Collection Time: 09/28/23  2:52 PM  Result Value Ref Range   WBC 6.7 4.0 - 10.5 K/uL   RBC 3.37 (L) 3.87 - 5.11 MIL/uL   Hemoglobin 9.7 (L) 12.0 - 15.0 g/dL   HCT 70.1 (L) 63.9 - 53.9 %   MCV 88.4 80.0 - 100.0 fL   MCH 28.8 26.0 - 34.0 pg   MCHC 32.6 30.0 - 36.0 g/dL   RDW 85.1 88.4 - 84.4 %   Platelets 191 150 - 400 K/uL   nRBC 0.0 0.0 - 0.2 %     EKG EKG Interpretation Date/Time:  Saturday September 28 2023 08:09:41 EST Ventricular Rate:  88 PR Interval:  127 QRS Duration:  96 QT Interval:  442 QTC Calculation: 535 R Axis:   17  Text Interpretation: Sinus rhythm Non-specific ST-t changes Confirmed by Bernard Drivers ([id number]) on 09/28/2023 8:21:20 AM  Radiology DG Chest Port 1 View Result Date: 09/28/2023 CLINICAL DATA:  Pain EXAM: PORTABLE CHEST - 1 VIEW COMPARISON:  08/25/2023 FINDINGS: Heart is mildly enlarged.  Mild pulmonary vascular congestion. Mild bibasilar opacities likely due to atelectasis. Tunneled right IJ venous catheter is unchanged in position terminating near the cavoatrial junction. IMPRESSION: Unchanged mild cardiomegaly and mild pulmonary vascular congestion. Electronically Signed   By: Aliene Lloyd M.D.   On: 09/28/2023 09:40    Procedures Procedures    Medications Ordered in ED Medications  morphine  (PF) 4 MG/ML injection 4 mg (4 mg Intravenous Given 09/28/23 0938)  ondansetron  (ZOFRAN ) injection 4 mg (4 mg Intravenous Given 09/28/23 0937)  famotidine  (PEPCID ) tablet 20 mg (20 mg Oral Given 09/28/23 0938)  alum & mag hydroxide-simeth (MAALOX/MYLANTA) 200-200-20 MG/5ML suspension 30 mL (30 mLs  Oral Given 09/28/23 0938)  acetaminophen  (TYLENOL ) tablet 1,000 mg (1,000 mg Oral Given 09/28/23 0938)  ondansetron  (ZOFRAN ) injection 4 mg (4 mg Intravenous Given 09/28/23 1135)    ED Course/ Medical Decision Making/ A&P                                 Medical Decision  Making Amount and/or Complexity of Data Reviewed Labs: ordered. Radiology: ordered.  Risk OTC drugs. Prescription drug management.   Iv ns. Continuous pulse ox and cardiac monitoring. Labs ordered/sent. Imaging ordered.   Differential diagnosis includes acs, msk cp, gi cp, sud assoc cp, etc. Dispo decision including potential need for admission considered - will get labs and imaging and reassess.   Reviewed nursing notes and prior charts for additional history. External reports reviewed. Additional history from: family/friend/ems.   Cardiac monitor: sinus rhythm, rate 80.  Pt requests pain med. Morphine  iv. Zofran  iv.   Labs reviewed/interpreted by me - hgb 9.7 c/w prior. Esrd. K not elevated.  Trop mildly elev, mildly elevated prior.   Additional labs reviewed/interpreted by me - delta trop not increasing, same as initial, felt not c/w acs. Cocaine +.   Xrays reviewed/interpreted by me - no pna or ptx.   Recheck. Symptoms improved. No chest pain or discomfort. No sob or increased wob. Room air pulse ox currently is 98% and rr currently 14.   Pt now asking for food/drink - provided. No emesis.   Pt continues to feel improved, no chest pain or sob. Pt feels ready for d/c.   Rec close pcp/card f/u.  Return precautions provided.          Final Clinical Impression(s) / ED Diagnoses Final diagnoses:  None    Rx / DC Orders ED Discharge Orders     None         Bernard Drivers, MD 09/28/23 1557
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
## 2023-10-08 ENCOUNTER — Other Ambulatory Visit (HOSPITAL_COMMUNITY): Payer: Self-pay | Admitting: Cardiology

## 2023-10-09 LAB — OXYCODONES,MS,WB/SP RFX
Oxycocone: NEGATIVE ng/mL
Oxycodones Confirmation: NEGATIVE
Oxymorphone: NEGATIVE ng/mL

## 2023-10-10 LAB — DRUG SCREEN 10 W/CONF, SERUM
Amphetamines, IA: NEGATIVE ng/mL
Barbiturates, IA: NEGATIVE ug/mL
Benzodiazepines, IA: NEGATIVE ng/mL
Cocaine & Metabolite, IA: POSITIVE ng/mL — AB
Methadone, IA: NEGATIVE ng/mL
Opiates, IA: NEGATIVE ng/mL
Oxycodones, IA: NEGATIVE ng/mL
Phencyclidine, IA: NEGATIVE ng/mL
Propoxyphene, IA: NEGATIVE ng/mL
THC(Marijuana) Metabolite, IA: NEGATIVE ng/mL

## 2023-10-10 LAB — COCAINE,MS,WB/SP RFX
Benzoylecgonine: 1013 ng/mL
Cocaine Confirmation: POSITIVE
Cocaine: NEGATIVE ng/mL

## 2023-11-07 ENCOUNTER — Emergency Department (HOSPITAL_COMMUNITY)

## 2023-11-07 ENCOUNTER — Emergency Department (HOSPITAL_COMMUNITY)
Admission: EM | Admit: 2023-11-07 | Discharge: 2023-11-07 | Disposition: A | Discharge status: Home / Self Care | Attending: Emergency Medicine | Admitting: Emergency Medicine

## 2023-11-07 ENCOUNTER — Encounter (HOSPITAL_COMMUNITY): Payer: Self-pay

## 2023-11-07 ENCOUNTER — Other Ambulatory Visit: Payer: Self-pay

## 2023-11-07 DIAGNOSIS — Z79899 Other long term (current) drug therapy: Secondary | ICD-10-CM | POA: Diagnosis not present

## 2023-11-07 DIAGNOSIS — Z992 Dependence on renal dialysis: Secondary | ICD-10-CM | POA: Insufficient documentation

## 2023-11-07 DIAGNOSIS — J40 Bronchitis, not specified as acute or chronic: Secondary | ICD-10-CM | POA: Diagnosis not present

## 2023-11-07 DIAGNOSIS — E1122 Type 2 diabetes mellitus with diabetic chronic kidney disease: Secondary | ICD-10-CM | POA: Diagnosis not present

## 2023-11-07 DIAGNOSIS — I509 Heart failure, unspecified: Secondary | ICD-10-CM | POA: Insufficient documentation

## 2023-11-07 DIAGNOSIS — Z7982 Long term (current) use of aspirin: Secondary | ICD-10-CM | POA: Insufficient documentation

## 2023-11-07 DIAGNOSIS — I132 Hypertensive heart and chronic kidney disease with heart failure and with stage 5 chronic kidney disease, or end stage renal disease: Secondary | ICD-10-CM | POA: Insufficient documentation

## 2023-11-07 DIAGNOSIS — N186 End stage renal disease: Secondary | ICD-10-CM | POA: Insufficient documentation

## 2023-11-07 DIAGNOSIS — R0602 Shortness of breath: Secondary | ICD-10-CM | POA: Diagnosis present

## 2023-11-07 LAB — COMPREHENSIVE METABOLIC PANEL
ALT: 49 U/L — ABNORMAL HIGH (ref 0–44)
AST: 57 U/L — ABNORMAL HIGH (ref 15–41)
Albumin: 2.3 g/dL — ABNORMAL LOW (ref 3.5–5.0)
Alkaline Phosphatase: 103 U/L (ref 38–126)
Anion gap: 12 (ref 5–15)
BUN: 11 mg/dL (ref 8–23)
CO2: 28 mmol/L (ref 22–32)
Calcium: 7.6 mg/dL — ABNORMAL LOW (ref 8.9–10.3)
Chloride: 97 mmol/L — ABNORMAL LOW (ref 98–111)
Creatinine, Ser: 4.31 mg/dL — ABNORMAL HIGH (ref 0.44–1.00)
GFR, Estimated: 11 mL/min — ABNORMAL LOW (ref >60)
Glucose, Bld: 75 mg/dL (ref 70–99)
Potassium: 3.7 mmol/L (ref 3.5–5.1)
Sodium: 137 mmol/L (ref 135–145)
Total Bilirubin: 0.8 mg/dL (ref 0.0–1.2)
Total Protein: 6 g/dL — ABNORMAL LOW (ref 6.5–8.1)

## 2023-11-07 LAB — CBC
HCT: 28.5 % — ABNORMAL LOW (ref 36.0–46.0)
Hemoglobin: 9.3 g/dL — ABNORMAL LOW (ref 12.0–15.0)
MCH: 27.8 pg (ref 26.0–34.0)
MCHC: 32.6 g/dL (ref 30.0–36.0)
MCV: 85.3 fL (ref 80.0–100.0)
Platelets: 157 10*3/uL (ref 150–400)
RBC: 3.34 MIL/uL — ABNORMAL LOW (ref 3.87–5.11)
RDW: 15.5 % (ref 11.5–15.5)
WBC: 4.5 10*3/uL (ref 4.0–10.5)
nRBC: 1.3 % — ABNORMAL HIGH (ref 0.0–0.2)

## 2023-11-07 LAB — RESP PANEL BY RT-PCR (RSV, FLU A&B, COVID)  RVPGX2
Influenza A by PCR: NEGATIVE
Influenza B by PCR: NEGATIVE
Resp Syncytial Virus by PCR: NEGATIVE
SARS Coronavirus 2 by RT PCR: NEGATIVE

## 2023-11-07 MED ORDER — DOXYCYCLINE HYCLATE 100 MG PO TABS
100.0000 mg | ORAL_TABLET | Freq: Once | ORAL | Status: AC
  Administered 2023-11-07: 100 mg via ORAL
  Filled 2023-11-07: qty 1

## 2023-11-07 MED ORDER — DOXYCYCLINE HYCLATE 100 MG PO CAPS: 100.0000 mg | ORAL_CAPSULE | Freq: Two times a day (BID) | ORAL | 0 refills | Status: DC

## 2023-11-07 MED ORDER — PROMETHAZINE-DM 6.25-15 MG/5ML PO SYRP: 5.0000 mL | ORAL_SOLUTION | Freq: Four times a day (QID) | ORAL | 0 refills | Status: AC | PRN

## 2023-11-07 NOTE — ED Provider Notes (Signed)
 [place] EMERGENCY DEPARTMENT AT [facility] Provider Note   CSN: [id number] Arrival date & time: 11/07/23  1522     History  Chief Complaint  Patient presents with   Chills   Shortness of Breath    Carol [name] is a 63 y.o. female history of DM is already on dialysis, hypertension, heart failure here presenting with shortness of breath and chills.  Patient states that she has been running chills for the last 2 days.  She did do dialysis yesterday but they did not take that much fluid off.  Patient states that she feels short of breath.  Patient denies any sick contacts.  Patient was noted to have 92% on room air per EMS.  Patient was placed on oxygen.  Patient normally does not use oxygen at home.  The history is provided by the patient.       Home Medications Prior to Admission medications   Medication Sig Start Date End Date Taking? Authorizing Provider  albuterol (PROVENTIL HFA;VENTOLIN HFA) 108 (90 Base) MCG/ACT inhaler Inhale 2 puffs into the lungs every 4 (four) hours as needed for wheezing or shortness of breath.    [provider]  albuterol (VENTOLIN HFA) 108 (90 Base) MCG/ACT inhaler Inhale 1-2 puffs into the lungs every 6 (six) hours as needed for wheezing or shortness of breath. 08/25/23   Sloan Leiter, DO  amLODipine (NORVASC) 10 MG tablet Take 1 tablet (10 mg total) by mouth daily. 11/01/22   Jacklynn Ganong, FNP  aspirin EC 81 MG tablet Take 1 tablet (81 mg total) by mouth daily. Swallow whole. 05/11/22   Quincy Simmonds, MD  atorvastatin (LIPITOR) 80 MG tablet Take 1 tablet (80 mg total) by mouth daily. 03/11/23   Azucena Fallen, MD  budesonide-formoterol [facility]) 160-4.5 MCG/ACT inhaler Inhale 2 puffs into the lungs in the morning and at bedtime. 11/09/21   [provider]  cetirizine (ZYRTEC) 10 MG tablet Take 10 mg by mouth daily as needed for allergies.    [provider]  Darbepoetin Alfa (ARANESP) 60  MCG/0.3ML SOSY injection Inject 0.3 mLs (60 mcg total) into the skin every Thursday at 6pm. 08/08/23   Dezii, Alexandra, DO  eszopiclone 3 MG TABS Take 3 mg by mouth at bedtime. Take immediately before bedtime    [provider]  gabapentin (NEURONTIN) 100 MG capsule Take 1 capsule (100 mg total) by mouth 2 (two) times daily. 08/14/23 09/13/23  Dezii, Gordy Councilman, DO  glucagon 1 MG injection Follow package directions for low blood sugar. 08/14/23 08/13/24  Dezii, Alexandra, DO  guaiFENesin-dextromethorphan (ROBITUSSIN DM) 100-10 MG/5ML syrup Take 5 mLs by mouth every 4 (four) hours as needed for cough. 08/25/23   Sloan Leiter, DO  naloxone [facility]) nasal spray 4 mg/0.1 mL If concern for opioid overdose, spray in nostril. Call 911. 08/14/23   Dezii, Gordy Councilman, DO  naphazoline-pheniramine (NAPHCON-A) 0.025-0.3 % ophthalmic solution Place 2 drops into both eyes 2 (two) times daily.    [provider]  nitroGLYCERIN (NITROSTAT) 0.4 MG SL tablet Place 1 tablet (0.4 mg total) under the tongue every 5 (five) minutes as needed for chest pain. 05/11/22 11/01/23  Quincy Simmonds, MD  pantoprazole (PROTONIX) 40 MG tablet Take 1 tablet (40 mg total) by mouth daily at 12 noon. Patient taking differently: Take 40 mg by mouth at bedtime. 05/11/22   Quincy Simmonds, MD  triamcinolone ointment (KENALOG) 0.1 % 1 Application 2 (two) times daily as needed. 06/29/23  [provider]      Allergies    Penicillins and Ultram [tramadol]    Review of Systems   Review of Systems  Respiratory:  Positive for cough and shortness of breath.   All other systems reviewed and are negative.   Physical Exam Updated Vital Signs BP 132/65   Pulse 78   Temp 98.3 F (36.8 C)   Resp 18   Ht 5\' 4"  (1.626 m)   Wt 77.1 kg   SpO2 93%   BMI 29.18 kg/m  Physical Exam Vitals and nursing note reviewed.  Constitutional:      Comments: Chronically ill-appearing  HENT:     Head: Normocephalic.  Eyes:      Pupils: Pupils are equal, round, and reactive to light.  Cardiovascular:     Rate and Rhythm: Normal rate and regular rhythm.  Pulmonary:     Comments: Diminished bilaterally but no wheezing or retractions Abdominal:     General: Bowel sounds are normal.     Palpations: Abdomen is soft.  Musculoskeletal:     Cervical back: Normal range of motion and neck supple.  Skin:    General: Skin is warm.     Capillary Refill: Capillary refill takes less than 2 seconds.  Neurological:     General: No focal deficit present.     Mental Status: She is oriented to person, place, and time.  Psychiatric:        Mood and Affect: Mood normal.        Behavior: Behavior normal.     ED Results / Procedures / Treatments   Labs (all labs ordered are listed, but only abnormal results are displayed) Labs Reviewed  COMPREHENSIVE METABOLIC PANEL - Abnormal; Notable for the following components:      Result Value   Chloride 97 (*)    Creatinine, Ser 4.31 (*)    Calcium 7.6 (*)    Total Protein 6.0 (*)    Albumin 2.3 (*)    AST 57 (*)    ALT 49 (*)    GFR, Estimated 11 (*)    All other components within normal limits  CBC - Abnormal; Notable for the following components:   RBC 3.34 (*)    Hemoglobin 9.3 (*)    HCT 28.5 (*)    nRBC 1.3 (*)    All other components within normal limits  RESP PANEL BY RT-PCR (RSV, FLU A&B, COVID)  [id number]    EKG None  Radiology DG Chest 1 View Result Date: 11/07/2023 CLINICAL DATA:  Chest pain. EXAM: CHEST  1 VIEW COMPARISON:  February 2025. FINDINGS: Mild cardiomegaly is noted with central pulmonary vascular congestion. Probable mild bilateral pulmonary edema is noted. Right internal jugular dialysis catheter is unchanged. Bony thorax is unremarkable. IMPRESSION: Mild cardiomegaly with central pulmonary vascular congestion and probable mild bilateral pulmonary edema. Electronically Signed   By: Lupita Raider M.D.   On: 11/07/2023 16:28    Procedures Procedures     Medications Ordered in ED Medications  doxycycline (VIBRA-TABS) tablet 100 mg (has no administration in time range)    ED Course/ Medical Decision Making/ A&P                                 Medical Decision Making Carol [name] is a 63 y.o. female here presenting with cough and congestion.  Patient is dialysis patient.  Considered pneumonia versus COVID or  flu.  Will also check electrolytes.  6:15 PM I reviewed patient's labs and COVID flu and RSV negative.  Creatinine is baseline at 4.3 and potassium is normal.  Chest x-ray did not show any pneumonia.  Patient has no oxygen requirement.  I think she likely has bronchitis versus early pneumonia.  Will give a course of doxycycline.  I encouraged her to go to dialysis tomorrow   Problems Addressed: Bronchitis: acute illness or injury ESRD (end stage renal disease) on dialysis [facility]): acute illness or injury  Amount and/or Complexity of Data Reviewed Labs: ordered. Decision-making details documented in ED Course.  Risk Prescription drug management.    Final Clinical Impression(s) / ED Diagnoses Final diagnoses:  None    Rx / DC Orders ED Discharge Orders     None         Charlynne Pander, MD 11/07/23 1816

## 2023-11-07 NOTE — ED Triage Notes (Signed)
 Pt bib ems for cold like symptoms going around the house. Pt has congestion, weakness and SOB. Denies pain . Spo2 92% RA, 96% 3LNC. 130/60 HR 80 CBG 90. Pt compliant with HD but there has been a decrease in the amount of fluid taken off.Pt also endorses fever chills

## 2023-11-07 NOTE — Discharge Instructions (Signed)
 As we discussed, you likely have bronchitis or early pneumonia  I have prescribed doxycycline twice a day for a week   I have also prescribed cough medicine  You need to go to dialysis tomorrow as scheduled   See your doctor for follow up   Return to ER if you have worse shortness of breath, fever, trouble breathing

## 2023-12-10 ENCOUNTER — Encounter (HOSPITAL_COMMUNITY): Payer: Self-pay

## 2023-12-10 ENCOUNTER — Other Ambulatory Visit: Payer: Self-pay

## 2023-12-10 ENCOUNTER — Emergency Department (HOSPITAL_COMMUNITY)

## 2023-12-10 ENCOUNTER — Emergency Department (HOSPITAL_COMMUNITY)
Admission: EM | Admit: 2023-12-10 | Discharge: 2023-12-10 | Disposition: A | Discharge status: Home / Self Care | Attending: Emergency Medicine | Admitting: Emergency Medicine

## 2023-12-10 DIAGNOSIS — Z7982 Long term (current) use of aspirin: Secondary | ICD-10-CM | POA: Insufficient documentation

## 2023-12-10 DIAGNOSIS — W19XXXA Unspecified fall, initial encounter: Secondary | ICD-10-CM

## 2023-12-10 DIAGNOSIS — N186 End stage renal disease: Secondary | ICD-10-CM | POA: Insufficient documentation

## 2023-12-10 DIAGNOSIS — S2242XA Multiple fractures of ribs, left side, initial encounter for closed fracture: Secondary | ICD-10-CM | POA: Diagnosis not present

## 2023-12-10 DIAGNOSIS — S7002XA Contusion of left hip, initial encounter: Secondary | ICD-10-CM | POA: Insufficient documentation

## 2023-12-10 DIAGNOSIS — Z992 Dependence on renal dialysis: Secondary | ICD-10-CM | POA: Insufficient documentation

## 2023-12-10 DIAGNOSIS — W1839XA Other fall on same level, initial encounter: Secondary | ICD-10-CM | POA: Insufficient documentation

## 2023-12-10 DIAGNOSIS — S299XXA Unspecified injury of thorax, initial encounter: Secondary | ICD-10-CM | POA: Diagnosis present

## 2023-12-10 LAB — TROPONIN I (HIGH SENSITIVITY)
Troponin I (High Sensitivity): 19 ng/L — ABNORMAL HIGH (ref <18)
Troponin I (High Sensitivity): 19 ng/L — ABNORMAL HIGH (ref <18)

## 2023-12-10 LAB — BASIC METABOLIC PANEL WITH GFR
Anion gap: 11 (ref 5–15)
BUN: 14 mg/dL (ref 8–23)
CO2: 24 mmol/L (ref 22–32)
Calcium: 8.4 mg/dL — ABNORMAL LOW (ref 8.9–10.3)
Chloride: 104 mmol/L (ref 98–111)
Creatinine, Ser: 4.46 mg/dL — ABNORMAL HIGH (ref 0.44–1.00)
GFR, Estimated: 11 mL/min — ABNORMAL LOW (ref >60)
Glucose, Bld: 113 mg/dL — ABNORMAL HIGH (ref 70–99)
Potassium: 4.2 mmol/L (ref 3.5–5.1)
Sodium: 139 mmol/L (ref 135–145)

## 2023-12-10 LAB — CBC
HCT: 32.5 % — ABNORMAL LOW (ref 36.0–46.0)
Hemoglobin: 10.2 g/dL — ABNORMAL LOW (ref 12.0–15.0)
MCH: 28.5 pg (ref 26.0–34.0)
MCHC: 31.4 g/dL (ref 30.0–36.0)
MCV: 90.8 fL (ref 80.0–100.0)
Platelets: 170 10*3/uL (ref 150–400)
RBC: 3.58 MIL/uL — ABNORMAL LOW (ref 3.87–5.11)
RDW: 18.1 % — ABNORMAL HIGH (ref 11.5–15.5)
WBC: 6.5 10*3/uL (ref 4.0–10.5)
nRBC: 0 % (ref 0.0–0.2)

## 2023-12-10 MED ORDER — LIDOCAINE 5 % EX PTCH: 1.0000 | MEDICATED_PATCH | CUTANEOUS | 0 refills | Status: AC

## 2023-12-10 MED ORDER — OXYCODONE-ACETAMINOPHEN 5-325 MG PO TABS
1.0000 | ORAL_TABLET | Freq: Three times a day (TID) | ORAL | 0 refills | Status: AC | PRN
Start: 2023-12-10 — End: ?
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
## 2023-12-10 NOTE — ED Provider Notes (Signed)
 [facility] Provider Note   CSN: [id number] Arrival date & time: 12/10/23  2595     History  Chief Complaint  Patient presents with   Chest Pain   left side pain   Fall   Hip Pain   Shortness of Breath    Carol [name] is a 63 y.o. female.  HPI Patient presents with concern of pain on the left side.  Patient had a fall 2 days ago.  Notably the fall occurred just prior to going to dialysis, was mechanical, full recall.  She notes that she fell on her left side, and since that time has had pain throughout that region, primarily in the left axilla.  Mild dyspnea with motion.  She went to dialysis that day and again today, but with worsening pain comes for evaluation.     Home Medications Prior to Admission medications   Medication Sig Start Date End Date Taking? Authorizing Provider  lidocaine  (LIDODERM ) 5 % Place 1 patch onto the skin daily. Remove & Discard patch within 12 hours or as directed by MD 12/10/23  Yes Dorenda Gandy, MD  oxyCODONE -acetaminophen  (PERCOCET/ROXICET) 5-325 MG tablet Take 1 tablet by mouth every 8 (eight) hours as needed for severe pain (pain score 7-10). 12/10/23  Yes Dorenda Gandy, MD  albuterol  (PROVENTIL  HFA;VENTOLIN  HFA) 108 (90 Base) MCG/ACT inhaler Inhale 2 puffs into the lungs every 4 (four) hours as needed for wheezing or shortness of breath.    [provider]  albuterol  (VENTOLIN  HFA) 108 (90 Base) MCG/ACT inhaler Inhale 1-2 puffs into the lungs every 6 (six) hours as needed for wheezing or shortness of breath. 08/25/23   Teddi Favors, DO  amLODipine  (NORVASC ) 10 MG tablet Take 1 tablet (10 mg total) by mouth daily. 11/01/22   Elmarie Hacking, FNP  aspirin  EC 81 MG tablet Take 1 tablet (81 mg total) by mouth daily. Swallow whole. 05/11/22   Barnetta Liberty, MD  atorvastatin  (LIPITOR ) 80 MG tablet Take 1 tablet (80 mg total) by mouth daily. 03/11/23   Haydee Lipa, MD   budesonide -formoterol  [facility]) 160-4.5 MCG/ACT inhaler Inhale 2 puffs into the lungs in the morning and at bedtime. 11/09/21   [provider]  cetirizine (ZYRTEC) 10 MG tablet Take 10 mg by mouth daily as needed for allergies.    [provider]  Darbepoetin Alfa  (ARANESP ) 60 MCG/0.3ML SOSY injection Inject 0.3 mLs (60 mcg total) into the skin every Thursday at 6pm. 08/08/23   Dezii, Alexandra, DO  doxycycline  (VIBRAMYCIN ) 100 MG capsule Take 1 capsule (100 mg total) by mouth 2 (two) times daily. One po bid x 7 days 11/07/23   Dalene Duck, MD  eszopiclone 3 MG TABS Take 3 mg by mouth at bedtime. Take immediately before bedtime    [provider]  gabapentin  (NEURONTIN ) 100 MG capsule Take 1 capsule (100 mg total) by mouth 2 (two) times daily. 08/14/23 09/13/23  Dezii, Alexandra, DO  glucagon  1 MG injection Follow package directions for low blood sugar. 08/14/23 08/13/24  Dezii, Alexandra, DO  guaiFENesin -dextromethorphan (ROBITUSSIN DM) 100-10 MG/5ML syrup Take 5 mLs by mouth every 4 (four) hours as needed for cough. 08/25/23   Teddi Favors, DO  naloxone  (NARCAN ) nasal spray 4 mg/0.1 mL If concern for opioid overdose, spray in nostril. Call 911. 08/14/23   Dezii, Alexandra, DO  naphazoline-pheniramine (NAPHCON-A) 0.025-0.3 % ophthalmic solution Place 2 drops into both eyes 2 (two) times daily.  [provider]  nitroGLYCERIN  (NITROSTAT ) 0.4 MG SL tablet Place 1 tablet (0.4 mg total) under the tongue every 5 (five) minutes as needed for chest pain. 05/11/22 11/01/23  Barnetta Liberty, MD  pantoprazole  (PROTONIX ) 40 MG tablet Take 1 tablet (40 mg total) by mouth daily at 12 noon. Patient taking differently: Take 40 mg by mouth at bedtime. 05/11/22   Barnetta Liberty, MD  promethazine -dextromethorphan (PROMETHAZINE -DM) 6.25-15 MG/5ML syrup Take 5 mLs by mouth 4 (four) times daily as needed for cough. 11/07/23   Dalene Duck, MD  triamcinolone  ointment  (KENALOG ) 0.1 % 1 Application 2 (two) times daily as needed. 06/29/23   [provider]      Allergies    Penicillins and Ultram  [tramadol ]    Review of Systems   Review of Systems  Physical Exam Updated Vital Signs BP (!) 147/76 (BP Location: Left Arm)   Pulse 74   Temp 97.8 F (36.6 C) (Oral)   Resp 17   Ht 5\' 4"  (1.626 m)   Wt 71.2 kg   SpO2 97%   BMI 26.95 kg/m  Physical Exam Vitals and nursing note reviewed.  Constitutional:      General: She is not in acute distress.    Appearance: She is well-developed.  HENT:     Head: Normocephalic and atraumatic.  Eyes:     Conjunctiva/sclera: Conjunctivae normal.  Cardiovascular:     Rate and Rhythm: Normal rate and regular rhythm.  Pulmonary:     Effort: Pulmonary effort is normal. No respiratory distress.     Breath sounds: Normal breath sounds. No stridor.  Chest:    Abdominal:     General: There is no distension.  Skin:    General: Skin is warm and dry.  Neurological:     Mental Status: She is alert and oriented to person, place, and time.     Cranial Nerves: No cranial nerve deficit.  Psychiatric:        Mood and Affect: Mood normal.     ED Results / Procedures / Treatments   Labs (all labs ordered are listed, but only abnormal results are displayed) Labs Reviewed  BASIC METABOLIC PANEL WITH GFR - Abnormal; Notable for the following components:      Result Value   Glucose, Bld 113 (*)    Creatinine, Ser 4.46 (*)    Calcium  8.4 (*)    GFR, Estimated 11 (*)    All other components within normal limits  CBC - Abnormal; Notable for the following components:   RBC 3.58 (*)    Hemoglobin 10.2 (*)    HCT 32.5 (*)    RDW 18.1 (*)    All other components within normal limits  TROPONIN I (HIGH SENSITIVITY) - Abnormal; Notable for the following components:   Troponin I (High Sensitivity) 19 (*)    All other components within normal limits  TROPONIN I (HIGH SENSITIVITY) - Abnormal; Notable for the  following components:   Troponin I (High Sensitivity) 19 (*)    All other components within normal limits  URINALYSIS, ROUTINE W REFLEX MICROSCOPIC    EKG EKG Interpretation Date/Time:  Tuesday December 10 2023 09:04:35 EDT Ventricular Rate:  82 PR Interval:  136 QRS Duration:  84 QT Interval:  450 QTC Calculation: 525 R Axis:   39  Text Interpretation: Normal sinus rhythm Prolonged QT Abnormal ECG No significant change since last tracing Confirmed by Dorenda Gandy [phone]) on 12/10/2023 1:47:33 PM  Radiology DG Ribs Unilateral W/Chest  Left Result Date: 12/10/2023 CLINICAL DATA:  Left rib pain after fall yesterday. EXAM: LEFT RIBS AND CHEST - 3+ VIEW COMPARISON:  November 07, 2023. FINDINGS: Minimally displaced fractures are seen involving the left sixth and seventh ribs. There is no evidence of pneumothorax or pleural effusion. Both lungs are clear. Stable cardiomediastinal silhouette. Right internal jugular dialysis catheter is noted. IMPRESSION: Minimally displaced left sixth and seventh rib fractures. Electronically Signed   By: Rosalene Colon M.D.   On: 12/10/2023 11:34   DG Hip Unilat W or Wo Pelvis 2-3 Views Left Result Date: 12/10/2023 CLINICAL DATA:  Status post fall with left hip pain EXAM: DG HIP (WITH OR WITHOUT PELVIS) 3V LEFT COMPARISON:  None Available. FINDINGS: There is no evidence of hip fracture or dislocation. There is no evidence of arthropathy or other focal bone abnormality. Subcutaneous soft tissue stranding overlying the left hip. IMPRESSION: 1. No acute fracture or dislocation. 2. Subcutaneous soft tissue stranding overlying the left hip, likely soft tissue contusion. Electronically Signed   By: Limin  Xu M.D.   On: 12/10/2023 11:33    Procedures Procedures    Medications Ordered in ED Medications - No data to display  ED Course/ Medical Decision Making/ A&P                                 Medical Decision Making Adult female with multiple medical problems  including end-stage renal disease, on dialysis presents with pain in multiple areas following a fall.  She is awake, alert, hemodynamically unremarkable, given passage of days since the event low suspicion for intracranial abnormality, or internal bleeding.  However, given her risk profile, concern for fracture, versus soft tissue injury, labs and x-ray all from triage reviewed. Cardiac 75 sinus normal Pulse ox 97% room air normal At bedside after my initial evaluation I demonstrated the pictures including rib fracture to her. With no respiratory compromise, passage of days as above, patient appropriate for discharge with otherwise reassuring labs.  Amount and/or Complexity of Data Reviewed External Data Reviewed: notes. Labs:  Decision-making details documented in ED Course. Radiology: independent interpretation performed. Decision-making details documented in ED Course. ECG/medicine tests: independent interpretation performed. Decision-making details documented in ED Course.  Risk Prescription drug management. Decision regarding hospitalization. Diagnosis or treatment significantly limited by social determinants of health.  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  Final Clinical Impression(s) / ED Diagnoses Final diagnoses:  Fall, initial encounter  Closed fracture of multiple ribs of left side, initial encounter  Contusion of left hip, initial encounter    Rx / DC Orders ED Discharge Orders          Ordered    lidocaine  (LIDODERM ) 5 %  Every 24 hours        12/10/23 1452    oxyCODONE -acetaminophen  (PERCOCET/ROXICET) 5-325 MG tablet  Every 8 hours PRN        12/10/23 1452              Dorenda Gandy, MD 12/10/23 1452

## 2023-12-10 NOTE — ED Provider Triage Note (Signed)
 Emergency Medicine Provider Triage Evaluation Note  Carol [name] , a 63 y.o. female  was evaluated in triage.  Pt complains of pain to left side of body after mechanical fall while going to dialysis. Primarily to chest and hip. No LOC; not on thinners. No SHOB  Review of Systems  Positive: CP  Negative: LOC  Physical Exam  BP 130/62   Pulse 80   Temp 98.4 F (36.9 C)   Resp 20   SpO2 99%  Gen:   Awake, no distress   Resp:  Normal effort, equal  MSK:   Moves extremities without difficulty. Left chest tender  Other:  Benign abd  Medical Decision Making  Medically screening exam initiated at 9:19 AM.  Appropriate orders placed.  Carol [name] was informed that the remainder of the evaluation will be completed by another provider, this initial triage assessment does not replace that evaluation, and the importance of remaining in the ED until their evaluation is complete.     Carol Climes, DO 12/10/23 [phone]

## 2023-12-10 NOTE — Discharge Instructions (Addendum)
As discussed, it is normal to feel worse in the days immediately following a fall regardless of medication use. ° °However, please take all medication as directed, use ice packs liberally.  If you develop any new, or concerning changes in your condition, please return here for further evaluation and management.   ° °Otherwise, please return followup with your physician ° ° ° °

## 2023-12-10 NOTE — ED Notes (Signed)
 Patient verbalizes understanding of instructions. Patient discharged by RN.

## 2024-01-03 ENCOUNTER — Other Ambulatory Visit: Payer: Self-pay

## 2024-01-03 ENCOUNTER — Emergency Department (HOSPITAL_COMMUNITY)

## 2024-01-03 ENCOUNTER — Inpatient Hospital Stay (HOSPITAL_COMMUNITY)
Admission: EM | Admit: 2024-01-03 | Discharge: 2024-01-16 | DRG: 853 | Disposition: A | Discharge status: Home Health | Attending: Internal Medicine | Admitting: Internal Medicine

## 2024-01-03 ENCOUNTER — Encounter (HOSPITAL_COMMUNITY): Payer: Self-pay

## 2024-01-03 ENCOUNTER — Inpatient Hospital Stay (HOSPITAL_COMMUNITY)

## 2024-01-03 DIAGNOSIS — Z833 Family history of diabetes mellitus: Secondary | ICD-10-CM | POA: Diagnosis not present

## 2024-01-03 DIAGNOSIS — K819 Cholecystitis, unspecified: Secondary | ICD-10-CM

## 2024-01-03 DIAGNOSIS — Z8249 Family history of ischemic heart disease and other diseases of the circulatory system: Secondary | ICD-10-CM | POA: Diagnosis not present

## 2024-01-03 DIAGNOSIS — Z992 Dependence on renal dialysis: Secondary | ICD-10-CM | POA: Diagnosis not present

## 2024-01-03 DIAGNOSIS — Z5331 Laparoscopic surgical procedure converted to open procedure: Secondary | ICD-10-CM | POA: Diagnosis not present

## 2024-01-03 DIAGNOSIS — N184 Chronic kidney disease, stage 4 (severe): Secondary | ICD-10-CM | POA: Diagnosis not present

## 2024-01-03 DIAGNOSIS — I2510 Atherosclerotic heart disease of native coronary artery without angina pectoris: Secondary | ICD-10-CM | POA: Diagnosis present

## 2024-01-03 DIAGNOSIS — F1410 Cocaine abuse, uncomplicated: Secondary | ICD-10-CM

## 2024-01-03 DIAGNOSIS — K8010 Calculus of gallbladder with chronic cholecystitis without obstruction: Secondary | ICD-10-CM | POA: Diagnosis present

## 2024-01-03 DIAGNOSIS — R7881 Bacteremia: Secondary | ICD-10-CM | POA: Diagnosis not present

## 2024-01-03 DIAGNOSIS — K219 Gastro-esophageal reflux disease without esophagitis: Secondary | ICD-10-CM | POA: Diagnosis present

## 2024-01-03 DIAGNOSIS — A419 Sepsis, unspecified organism: Secondary | ICD-10-CM | POA: Diagnosis not present

## 2024-01-03 DIAGNOSIS — K7460 Unspecified cirrhosis of liver: Secondary | ICD-10-CM | POA: Diagnosis present

## 2024-01-03 DIAGNOSIS — Z1152 Encounter for screening for COVID-19: Secondary | ICD-10-CM

## 2024-01-03 DIAGNOSIS — K5900 Constipation, unspecified: Secondary | ICD-10-CM | POA: Diagnosis present

## 2024-01-03 DIAGNOSIS — N2581 Secondary hyperparathyroidism of renal origin: Secondary | ICD-10-CM | POA: Diagnosis present

## 2024-01-03 DIAGNOSIS — R6520 Severe sepsis without septic shock: Secondary | ICD-10-CM | POA: Diagnosis not present

## 2024-01-03 DIAGNOSIS — Z841 Family history of disorders of kidney and ureter: Secondary | ICD-10-CM

## 2024-01-03 DIAGNOSIS — I160 Hypertensive urgency: Secondary | ICD-10-CM | POA: Diagnosis present

## 2024-01-03 DIAGNOSIS — Z79899 Other long term (current) drug therapy: Secondary | ICD-10-CM | POA: Diagnosis not present

## 2024-01-03 DIAGNOSIS — D72819 Decreased white blood cell count, unspecified: Secondary | ICD-10-CM | POA: Diagnosis present

## 2024-01-03 DIAGNOSIS — B377 Candidal sepsis: Secondary | ICD-10-CM | POA: Diagnosis present

## 2024-01-03 DIAGNOSIS — I340 Nonrheumatic mitral (valve) insufficiency: Secondary | ICD-10-CM | POA: Diagnosis not present

## 2024-01-03 DIAGNOSIS — K8020 Calculus of gallbladder without cholecystitis without obstruction: Secondary | ICD-10-CM | POA: Diagnosis not present

## 2024-01-03 DIAGNOSIS — J189 Pneumonia, unspecified organism: Principal | ICD-10-CM

## 2024-01-03 DIAGNOSIS — Z880 Allergy status to penicillin: Secondary | ICD-10-CM

## 2024-01-03 DIAGNOSIS — I272 Pulmonary hypertension, unspecified: Secondary | ICD-10-CM | POA: Diagnosis present

## 2024-01-03 DIAGNOSIS — I5033 Acute on chronic diastolic (congestive) heart failure: Secondary | ICD-10-CM | POA: Diagnosis not present

## 2024-01-03 DIAGNOSIS — R935 Abnormal findings on diagnostic imaging of other abdominal regions, including retroperitoneum: Secondary | ICD-10-CM | POA: Diagnosis not present

## 2024-01-03 DIAGNOSIS — K8018 Calculus of gallbladder with other cholecystitis without obstruction: Secondary | ICD-10-CM | POA: Diagnosis not present

## 2024-01-03 DIAGNOSIS — R197 Diarrhea, unspecified: Secondary | ICD-10-CM | POA: Diagnosis not present

## 2024-01-03 DIAGNOSIS — E1122 Type 2 diabetes mellitus with diabetic chronic kidney disease: Secondary | ICD-10-CM | POA: Diagnosis present

## 2024-01-03 DIAGNOSIS — K8000 Calculus of gallbladder with acute cholecystitis without obstruction: Secondary | ICD-10-CM | POA: Diagnosis not present

## 2024-01-03 DIAGNOSIS — D638 Anemia in other chronic diseases classified elsewhere: Secondary | ICD-10-CM | POA: Diagnosis not present

## 2024-01-03 DIAGNOSIS — I10 Essential (primary) hypertension: Secondary | ICD-10-CM | POA: Diagnosis not present

## 2024-01-03 DIAGNOSIS — E8770 Fluid overload, unspecified: Secondary | ICD-10-CM | POA: Diagnosis present

## 2024-01-03 DIAGNOSIS — I110 Hypertensive heart disease with heart failure: Secondary | ICD-10-CM | POA: Diagnosis not present

## 2024-01-03 DIAGNOSIS — N186 End stage renal disease: Secondary | ICD-10-CM | POA: Diagnosis present

## 2024-01-03 DIAGNOSIS — G20A1 Parkinson's disease without dyskinesia, without mention of fluctuations: Secondary | ICD-10-CM | POA: Diagnosis present

## 2024-01-03 DIAGNOSIS — Z823 Family history of stroke: Secondary | ICD-10-CM

## 2024-01-03 DIAGNOSIS — I120 Hypertensive chronic kidney disease with stage 5 chronic kidney disease or end stage renal disease: Secondary | ICD-10-CM | POA: Diagnosis present

## 2024-01-03 DIAGNOSIS — E041 Nontoxic single thyroid nodule: Secondary | ICD-10-CM | POA: Diagnosis present

## 2024-01-03 DIAGNOSIS — Z825 Family history of asthma and other chronic lower respiratory diseases: Secondary | ICD-10-CM

## 2024-01-03 DIAGNOSIS — Z7951 Long term (current) use of inhaled steroids: Secondary | ICD-10-CM

## 2024-01-03 DIAGNOSIS — F17210 Nicotine dependence, cigarettes, uncomplicated: Secondary | ICD-10-CM | POA: Diagnosis present

## 2024-01-03 DIAGNOSIS — L299 Pruritus, unspecified: Secondary | ICD-10-CM | POA: Diagnosis present

## 2024-01-03 DIAGNOSIS — I5043 Acute on chronic combined systolic (congestive) and diastolic (congestive) heart failure: Secondary | ICD-10-CM | POA: Diagnosis not present

## 2024-01-03 DIAGNOSIS — E785 Hyperlipidemia, unspecified: Secondary | ICD-10-CM | POA: Diagnosis present

## 2024-01-03 DIAGNOSIS — I081 Rheumatic disorders of both mitral and tricuspid valves: Secondary | ICD-10-CM | POA: Diagnosis not present

## 2024-01-03 DIAGNOSIS — D631 Anemia in chronic kidney disease: Secondary | ICD-10-CM | POA: Diagnosis present

## 2024-01-03 DIAGNOSIS — Z7982 Long term (current) use of aspirin: Secondary | ICD-10-CM

## 2024-01-03 DIAGNOSIS — B9689 Other specified bacterial agents as the cause of diseases classified elsewhere: Secondary | ICD-10-CM | POA: Diagnosis not present

## 2024-01-03 DIAGNOSIS — I252 Old myocardial infarction: Secondary | ICD-10-CM

## 2024-01-03 DIAGNOSIS — I130 Hypertensive heart and chronic kidney disease with heart failure and stage 1 through stage 4 chronic kidney disease, or unspecified chronic kidney disease: Secondary | ICD-10-CM | POA: Diagnosis not present

## 2024-01-03 DIAGNOSIS — I38 Endocarditis, valve unspecified: Secondary | ICD-10-CM | POA: Diagnosis not present

## 2024-01-03 LAB — CBC
HCT: 32.9 % — ABNORMAL LOW (ref 36.0–46.0)
Hemoglobin: 10.3 g/dL — ABNORMAL LOW (ref 12.0–15.0)
MCH: 28.7 pg (ref 26.0–34.0)
MCHC: 31.3 g/dL (ref 30.0–36.0)
MCV: 91.6 fL (ref 80.0–100.0)
Platelets: 154 10*3/uL (ref 150–400)
RBC: 3.59 MIL/uL — ABNORMAL LOW (ref 3.87–5.11)
RDW: 17.8 % — ABNORMAL HIGH (ref 11.5–15.5)
WBC: 1.2 10*3/uL — CL (ref 4.0–10.5)
nRBC: 0 % (ref 0.0–0.2)

## 2024-01-03 LAB — CBC WITH DIFFERENTIAL/PLATELET
Abs Immature Granulocytes: 0 10*3/uL (ref 0.00–0.07)
Basophils Absolute: 0 10*3/uL (ref 0.0–0.1)
Basophils Relative: 1 %
Eosinophils Absolute: 0.1 10*3/uL (ref 0.0–0.5)
Eosinophils Relative: 5 %
HCT: 32.5 % — ABNORMAL LOW (ref 36.0–46.0)
Hemoglobin: 10.5 g/dL — ABNORMAL LOW (ref 12.0–15.0)
Lymphocytes Relative: 14 %
Lymphs Abs: 0.2 10*3/uL — ABNORMAL LOW (ref 0.7–4.0)
MCH: 29.6 pg (ref 26.0–34.0)
MCHC: 32.3 g/dL (ref 30.0–36.0)
MCV: 91.5 fL (ref 80.0–100.0)
Monocytes Absolute: 0 10*3/uL — ABNORMAL LOW (ref 0.1–1.0)
Monocytes Relative: 0 %
Neutro Abs: 1 10*3/uL — ABNORMAL LOW (ref 1.7–7.7)
Neutrophils Relative %: 80 %
Platelets: 162 10*3/uL (ref 150–400)
RBC: 3.55 MIL/uL — ABNORMAL LOW (ref 3.87–5.11)
RDW: 17.7 % — ABNORMAL HIGH (ref 11.5–15.5)
WBC: 1.3 10*3/uL — CL (ref 4.0–10.5)
nRBC: 0 % (ref 0.0–0.2)
nRBC: 1 /100{WBCs} — ABNORMAL HIGH

## 2024-01-03 LAB — TROPONIN I (HIGH SENSITIVITY)
Troponin I (High Sensitivity): 20 ng/L — ABNORMAL HIGH (ref <18)
Troponin I (High Sensitivity): 25 ng/L — ABNORMAL HIGH (ref <18)

## 2024-01-03 LAB — BASIC METABOLIC PANEL WITH GFR
Anion gap: 14 (ref 5–15)
BUN: 17 mg/dL (ref 8–23)
CO2: 20 mmol/L — ABNORMAL LOW (ref 22–32)
Calcium: 8.8 mg/dL — ABNORMAL LOW (ref 8.9–10.3)
Chloride: 104 mmol/L (ref 98–111)
Creatinine, Ser: 4.92 mg/dL — ABNORMAL HIGH (ref 0.44–1.00)
GFR, Estimated: 9 mL/min — ABNORMAL LOW (ref >60)
Glucose, Bld: 79 mg/dL (ref 70–99)
Potassium: 3.7 mmol/L (ref 3.5–5.1)
Sodium: 138 mmol/L (ref 135–145)

## 2024-01-03 LAB — URINALYSIS, ROUTINE W REFLEX MICROSCOPIC
Bilirubin Urine: NEGATIVE
Glucose, UA: NEGATIVE mg/dL
Hgb urine dipstick: NEGATIVE
Ketones, ur: NEGATIVE mg/dL
Leukocytes,Ua: NEGATIVE
Nitrite: NEGATIVE
Protein, ur: 100 mg/dL — AB
Specific Gravity, Urine: 1.008 (ref 1.005–1.030)
pH: 8 (ref 5.0–8.0)

## 2024-01-03 LAB — BRAIN NATRIURETIC PEPTIDE: B Natriuretic Peptide: 1129.3 pg/mL — ABNORMAL HIGH (ref 0.0–100.0)

## 2024-01-03 LAB — RESP PANEL BY RT-PCR (RSV, FLU A&B, COVID)  RVPGX2
Influenza A by PCR: NEGATIVE
Influenza B by PCR: NEGATIVE
Resp Syncytial Virus by PCR: NEGATIVE
SARS Coronavirus 2 by RT PCR: NEGATIVE

## 2024-01-03 LAB — I-STAT CG4 LACTIC ACID, ED: Lactic Acid, Venous: 1.5 mmol/L (ref 0.5–1.9)

## 2024-01-03 MED ORDER — ACETAMINOPHEN 650 MG RE SUPP: 650.0000 mg | Freq: Four times a day (QID) | RECTAL | Status: DC | PRN

## 2024-01-03 MED ORDER — ACETAMINOPHEN 325 MG PO TABS
650.0000 mg | ORAL_TABLET | Freq: Four times a day (QID) | ORAL | Status: DC | PRN
  Administered 2024-01-04 – 2024-01-12 (×7): 650 mg via ORAL
  Filled 2024-01-03 (×7): qty 2

## 2024-01-03 MED ORDER — SODIUM CHLORIDE 0.9 % IV SOLN
1.0000 g | Freq: Once | INTRAVENOUS | Status: AC
  Administered 2024-01-03: 1 g via INTRAVENOUS
  Filled 2024-01-03: qty 10

## 2024-01-03 MED ORDER — SODIUM CHLORIDE 0.9 % IV SOLN
2.0000 g | INTRAVENOUS | Status: DC
  Administered 2024-01-04 – 2024-01-09 (×6): 2 g via INTRAVENOUS
  Filled 2024-01-03 (×6): qty 20

## 2024-01-03 MED ORDER — HEPARIN SODIUM (PORCINE) 5000 UNIT/ML IJ SOLN
5000.0000 [IU] | Freq: Three times a day (TID) | INTRAMUSCULAR | Status: DC
  Administered 2024-01-03 – 2024-01-15 (×30): 5000 [IU] via SUBCUTANEOUS
  Filled 2024-01-03 (×32): qty 1

## 2024-01-03 MED ORDER — ACETAMINOPHEN 500 MG PO TABS
1000.0000 mg | ORAL_TABLET | Freq: Once | ORAL | Status: AC
  Administered 2024-01-03: 1000 mg via ORAL
  Filled 2024-01-03: qty 2

## 2024-01-03 MED ORDER — BISACODYL 5 MG PO TBEC
5.0000 mg | DELAYED_RELEASE_TABLET | Freq: Every day | ORAL | Status: DC | PRN
  Administered 2024-01-10: 5 mg via ORAL
  Filled 2024-01-03: qty 1

## 2024-01-03 MED ORDER — SODIUM CHLORIDE 0.9 % IV SOLN
500.0000 mg | Freq: Once | INTRAVENOUS | Status: AC
  Administered 2024-01-03: 500 mg via INTRAVENOUS
  Filled 2024-01-03: qty 5

## 2024-01-03 MED ORDER — LABETALOL HCL 5 MG/ML IV SOLN
10.0000 mg | Freq: Once | INTRAVENOUS | Status: AC
  Administered 2024-01-03: 10 mg via INTRAVENOUS
  Filled 2024-01-03: qty 4

## 2024-01-03 MED ORDER — ONDANSETRON HCL 4 MG/2ML IJ SOLN
4.0000 mg | Freq: Four times a day (QID) | INTRAMUSCULAR | Status: DC | PRN
  Administered 2024-01-03 – 2024-01-10 (×3): 4 mg via INTRAVENOUS
  Filled 2024-01-03 (×2): qty 2

## 2024-01-03 MED ORDER — OXYCODONE HCL 5 MG PO TABS
10.0000 mg | ORAL_TABLET | Freq: Once | ORAL | Status: AC
  Administered 2024-01-03: 10 mg via ORAL
  Filled 2024-01-03: qty 2

## 2024-01-03 MED ORDER — OXYCODONE HCL 5 MG PO TABS
10.0000 mg | ORAL_TABLET | Freq: Four times a day (QID) | ORAL | Status: DC | PRN
  Administered 2024-01-03: 10 mg via ORAL
  Filled 2024-01-03: qty 2

## 2024-01-03 NOTE — ED Notes (Signed)
 ED TO INPATIENT HANDOFF REPORT  ED Nurse Name and Phone #: Nerissa Bannister [phone]  S Name/Age/Gender Carol [name] 63 y.o. female Room/Bed: 039C/039C  Code Status   Code Status: Full Code  Home/SNF/Other Home Patient oriented to: self, place, time, and situation Is this baseline? Yes   Triage Complete: Triage complete  Chief Complaint Sepsis [facility]) [A41.9]  Triage Note Pt bib ems from dialysis c.o chest pain/sob about 30 mins PTA.  Pt did not receive dialysis today. Last dialysis was wednesday   240/80 HR 106 94% on 3L  324 ASA given  0.4 nitroglycerin , no relief of pain     Allergies Allergies  Allergen Reactions   Penicillins Shortness Of Breath and Other (See Comments)    Caused yeast infection Has patient had a PCN reaction causing immediate rash, facial/tongue/throat swelling, SOB or lightheadedness with hypotension: Yes Has patient had a PCN reaction causing severe rash involving mucus membranes or skin necrosis: No Has patient had a PCN reaction that required hospitalization pt was in the hospital at the time of the reaction Has patient had a PCN reaction occurring within the last 10 years: No If all of the above answers are "NO", then may proceed with Cephalosp   Ultram  [Tramadol ] Other (See Comments)    Made her tongue raw    Level of Care/Admitting Diagnosis ED Disposition     ED Disposition  Admit   Condition  --   Comment  Hospital Area: [facility] [[id number]]  Level of Care: Telemetry Medical [[id number]]  May admit patient to Arlin Benes or Maryan Smalling if equivalent level of care is available:: No  Covid Evaluation: Confirmed COVID Negative  Diagnosis: Sepsis [facility]) [[id number]]  Admitting Physician: Willadean Hark [[id number]]  Attending Physician: Willadean Hark [[id number]]  Certification:: I certify this patient will need inpatient services for at least 2 midnights  Expected Medical Readiness: 01/08/2024           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)    M,W,F   GERD (gastroesophageal reflux disease)    Heart murmur    Hypertension    NSTEMI (non-ST elevated myocardial infarction) (HCC) 12/20/2021   Palpitations    Parkinson's disease (HCC)    Past Surgical History:  Procedure Laterality Date   AV FISTULA PLACEMENT Left 08/01/2023   Procedure: LEFT ARM BRACHIOBASILIC FISTULA CREATION;  Surgeon: Philipp Brawn, MD;  Location: [facility] OR;  Service: Vascular;  Laterality: Left;   BALLOON DILATION N/A 11/06/2022   Procedure: BALLOON DILATION;  Surgeon: Sergio Dandy, MD;  Location: [facility] ENDOSCOPY;  Service: Gastroenterology;  Laterality: N/A;   CARPAL TUNNEL RELEASE     COLONOSCOPY WITH PROPOFOL  N/A 11/06/2022   Procedure: COLONOSCOPY WITH PROPOFOL ;  Surgeon: Sergio Dandy, MD;  Location: [facility] ENDOSCOPY;  Service: Gastroenterology;  Laterality: N/A;   ESOPHAGOGASTRODUODENOSCOPY (EGD) WITH PROPOFOL  N/A 11/06/2022   Procedure: ESOPHAGOGASTRODUODENOSCOPY (EGD) WITH PROPOFOL ;  Surgeon: Sergio Dandy, MD;  Location: [facility] ENDOSCOPY;  Service: Gastroenterology;  Laterality: N/A;   INSERTION OF DIALYSIS CATHETER Right 08/01/2023   Procedure: INSERTION OF TUNNELED DIALYSIS CATHETER;  Surgeon: Philipp Brawn, MD;  Location:  [facility] OR;  Service: Vascular;  Laterality: Right;   IR FLUORO GUIDE CV LINE RIGHT  01/26/2022   IR US  GUIDE VASC ACCESS RIGHT  01/26/2022   LEFT HEART CATH AND CORONARY ANGIOGRAPHY N/A 12/20/2021   Procedure: LEFT HEART CATH AND CORONARY ANGIOGRAPHY;  Surgeon: Odie Benne, MD;  Location: [facility] INVASIVE CV LAB;  Service: Cardiovascular;  Laterality: N/A;    OPEN REDUCTION INTERNAL FIXATION (ORIF) FINGER WITH RADIAL BONE GRAFT Right 09/12/2015   Procedure: RIGHT MIDDLE FINGER CLOSED REDUCTION AND PINNING;  Surgeon: Arvil Birks, MD;  Location: [facility] OR;  Service: Orthopedics;  Laterality: Right;   TONSILLECTOMY     TUBAL LIGATION       A IV Location/Drains/Wounds Patient Lines/Drains/Airways Status     Active Line/Drains/Airways     Name Placement date Placement time Site Days   Peripheral IV 01/03/24 20 G Posterior;Right Forearm 01/03/24  1437  Forearm  less than 1   Fistula / Graft Left Upper arm Arteriovenous fistula 08/01/23  1101  Upper arm  155   Hemodialysis Catheter Right Internal jugular Double lumen Permanent (Tunneled) 08/01/23  1030  Internal jugular  155            Intake/Output Last 24 hours  Intake/Output Summary (Last 24 hours) at 01/03/2024 2114 Last data filed at 01/03/2024 0981 Gross per 24 hour  Intake 251.05 ml  Output --  Net 251.05 ml    Labs/Imaging Results for orders placed or performed during the hospital encounter of 01/03/24 (from the past 48 hours)  Resp panel by RT-PCR (RSV, Flu A&B, Covid) Anterior Nasal Swab     Status: None   Collection Time: 01/03/24  1:33 PM   Specimen: Anterior Nasal Swab  Result Value Ref Range   SARS Coronavirus 2 by RT PCR NEGATIVE NEGATIVE   Influenza A by PCR NEGATIVE NEGATIVE   Influenza B by PCR NEGATIVE NEGATIVE    Comment: (NOTE) The Xpert Xpress SARS-CoV-2/FLU/RSV plus assay is intended as an aid in the diagnosis of influenza from Nasopharyngeal swab specimens and should not be used as a sole basis for treatment. Nasal washings and aspirates are unacceptable for Xpert Xpress SARS-CoV-2/FLU/RSV testing.  Fact Sheet for Patients: BloggerCourse.com  Fact Sheet for Healthcare Providers: SeriousBroker.it  This test is not yet approved or cleared by the [place]  FDA and has been authorized for detection  and/or diagnosis of SARS-CoV-2 by FDA under an Emergency Use Authorization (EUA). This EUA will remain in effect (meaning this test can be used) for the duration of the COVID-19 declaration under Section 564(b)(1) of the Act, 21 U.S.C. section 360bbb-3(b)(1), unless the authorization is terminated or revoked.     Resp Syncytial Virus by PCR NEGATIVE NEGATIVE    Comment: (NOTE) Fact Sheet for Patients: BloggerCourse.com  Fact Sheet for Healthcare Providers: SeriousBroker.it  This test is not yet approved or cleared by the [place]  FDA and has been authorized for detection and/or diagnosis of SARS-CoV-2 by FDA under an Emergency Use Authorization (EUA). This EUA will remain in effect (meaning this test can be used) for the duration of the COVID-19 declaration under Section 564(b)(1) of the Act, 21 U.S.C. section 360bbb-3(b)(1), unless the authorization is terminated or revoked.  Performed at [facility], [address], [place], [place] [address]   Basic metabolic panel     Status: Abnormal   Collection Time: 01/03/24  2:07 PM  Result Value Ref Range   Sodium 138 135 - 145 mmol/L  Potassium 3.7 3.5 - 5.1 mmol/L   Chloride 104 98 - 111 mmol/L   CO2 20 (L) 22 - 32 mmol/L   Glucose, Bld 79 70 - 99 mg/dL    Comment: Glucose reference range applies only to samples taken after fasting for at least 8 hours.   BUN 17 8 - 23 mg/dL   Creatinine, Ser 7.84 (H) 0.44 - 1.00 mg/dL   Calcium  8.8 (L) 8.9 - 10.3 mg/dL   GFR, Estimated 9 (L) >60 mL/min    Comment: (NOTE) Calculated using the CKD-EPI Creatinine Equation (2021)    Anion gap 14 5 - 15    Comment: Performed at [facility], [address], [place], [place] [address]  CBC     Status: Abnormal   Collection Time: 01/03/24  2:07 PM  Result Value Ref Range   WBC 1.2 (LL) 4.0 - 10.5 K/uL    Comment: REPEATED TO VERIFY THIS CRITICAL RESULT HAS VERIFIED AND BEEN  CALLED TO JOSHUA NEWTON, RN BY [employer] ON 05 16 2025 AT 1455, AND HAS BEEN READ BACK.     RBC 3.59 (L) 3.87 - 5.11 MIL/uL   Hemoglobin 10.3 (L) 12.0 - 15.0 g/dL   HCT 52.8 (L) 41.3 - 24.4 %   MCV 91.6 80.0 - 100.0 fL   MCH 28.7 26.0 - 34.0 pg   MCHC 31.3 30.0 - 36.0 g/dL   RDW 01.0 (H) 27.2 - 53.6 %   Platelets 154 150 - 400 K/uL   nRBC 0.0 0.0 - 0.2 %    Comment: Performed at [facility], [address] [place], [place] [address]  Troponin I (High Sensitivity)     Status: Abnormal   Collection Time: 01/03/24  2:07 PM  Result Value Ref Range   Troponin I (High Sensitivity) 20 (H) <18 ng/L    Comment: (NOTE) Elevated high sensitivity troponin I (hsTnI) values and significant  changes across serial measurements may suggest ACS but many other  chronic and acute conditions are known to elevate hsTnI results.  Refer to the "Links" section for chest pain algorithms and additional  guidance. Performed at [facility], [address], [place], [place] [address]   Brain natriuretic peptide     Status: Abnormal   Collection Time: 01/03/24  2:07 PM  Result Value Ref Range   B Natriuretic Peptide 1,129.3 (H) 0.0 - 100.0 pg/mL    Comment: Performed at [facility], [address] [place], [place] [address]  CBC with Differential     Status: Abnormal   Collection Time: 01/03/24  2:07 PM  Result Value Ref Range   WBC 1.3 (LL) 4.0 - 10.5 K/uL    Comment: CRITICAL VALUE NOTED.  VALUE IS CONSISTENT WITH PREVIOUSLY REPORTED AND CALLED VALUE. REPEATED TO VERIFY    RBC 3.55 (L) 3.87 - 5.11 MIL/uL   Hemoglobin 10.5 (L) 12.0 - 15.0 g/dL   HCT 75.6 (L) 43.3 - 29.5 %   MCV 91.5 80.0 - 100.0 fL   MCH 29.6 26.0 - 34.0 pg   MCHC 32.3 30.0 - 36.0 g/dL   RDW 18.8 (H) 41.6 - 60.6 %   Platelets 162 150 - 400 K/uL   nRBC 0.0 0.0 - 0.2 %   Neutrophils Relative % 80 %   Neutro Abs 1.0 (L) 1.7 - 7.7 K/uL   Lymphocytes Relative 14 %   Lymphs Abs 0.2 (L) 0.7 - 4.0 K/uL    Monocytes Relative 0 %  Monocytes Absolute 0.0 (L) 0.1 - 1.0 K/uL   Eosinophils Relative 5 %   Eosinophils Absolute 0.1 0.0 - 0.5 K/uL   Basophils Relative 1 %   Basophils Absolute 0.0 0.0 - 0.1 K/uL   nRBC 1 (H) 0 /100 WBC   Abs Immature Granulocytes 0.00 0.00 - 0.07 K/uL   Tear Drop Cells PRESENT    Polychromasia PRESENT     Comment: Performed at [facility], [address], [place], [place] [address]  Troponin I (High Sensitivity)     Status: Abnormal   Collection Time: 01/03/24  4:41 PM  Result Value Ref Range   Troponin I (High Sensitivity) 25 (H) <18 ng/L    Comment: (NOTE) Elevated high sensitivity troponin I (hsTnI) values and significant  changes across serial measurements may suggest ACS but many other  chronic and acute conditions are known to elevate hsTnI results.  Refer to the "Links" section for chest pain algorithms and additional  guidance. Performed at [facility], [address], [place], [place] [address]   I-Stat Lactic Acid, ED     Status: None   Collection Time: 01/03/24  4:47 PM  Result Value Ref Range   Lactic Acid, Venous 1.5 0.5 - 1.9 mmol/L  Urinalysis, Routine w reflex microscopic -Urine, Clean Catch     Status: Abnormal   Collection Time: 01/03/24  6:30 PM  Result Value Ref Range   Color, Urine YELLOW YELLOW   APPearance HAZY (A) CLEAR   Specific Gravity, Urine 1.008 1.005 - 1.030   pH 8.0 5.0 - 8.0   Glucose, UA NEGATIVE NEGATIVE mg/dL   Hgb urine dipstick NEGATIVE NEGATIVE   Bilirubin Urine NEGATIVE NEGATIVE   Ketones, ur NEGATIVE NEGATIVE mg/dL   Protein, ur 295 (A) NEGATIVE mg/dL   Nitrite NEGATIVE NEGATIVE   Leukocytes,Ua NEGATIVE NEGATIVE   RBC / HPF 0-5 0 - 5 RBC/hpf   WBC, UA 0-5 0 - 5 WBC/hpf   Bacteria, UA RARE (A) NONE SEEN   Squamous Epithelial / HPF 0-5 0 - 5 /HPF   Mucus PRESENT     Comment: Performed at [facility], [address] [place], [place] [address]   CT CHEST WO CONTRAST Result Date:  01/03/2024 CLINICAL DATA:  Chest pain.  Respiratory illness. EXAM: CT CHEST WITHOUT CONTRAST TECHNIQUE: Multidetector CT imaging of the chest was performed following the standard protocol without IV contrast. RADIATION DOSE REDUCTION: This exam was performed according to the departmental dose-optimization program which includes automated exposure control, adjustment of the mA and/or kV according to patient size and/or use of iterative reconstruction technique. COMPARISON:  January 22, 2022. FINDINGS: Cardiovascular: Mild cardiomegaly. No pericardial effusion. Right internal jugular dialysis catheter is noted with distal tip at cavoatrial junction. Atherosclerosis of thoracic aorta without aneurysm formation. Mediastinum/Nodes: 3.4 cm left thyroid nodule is noted. Esophagus is unremarkable. No adenopathy. Lungs/Pleura: No pneumothorax or pleural effusion is noted. Mild peripheral septal thickening is noted bilaterally, right greater than left, concerning for possible edema or atypical inflammation. Upper Abdomen: No acute abnormality. Musculoskeletal: Probable subacute fractures are seen involving the posterior portions of the left sixth, seventh and eighth ribs. Probable subacute right seventh rib fracture is noted as well. IMPRESSION: Mild peripheral septal thickening is noted in both lungs, right greater than left, concerning for possible edema or atypical inflammation. 3.4 cm left thyroid nodule is noted. Recommend thyroid US . (Ref: J Am Coll Radiol. 2015 Feb;12(2): 143-50). Multiple bilateral subacute rib fractures are  noted. Aortic Atherosclerosis (ICD10-I70.0). Electronically Signed   By: Rosalene Colon M.D.   On: 01/03/2024 15:36   DG Chest Portable 1 View Result Date: 01/03/2024 CLINICAL DATA:  Chest pain EXAM: PORTABLE CHEST 1 VIEW COMPARISON:  Chest radiograph dated 12/10/2023 FINDINGS: Lines/tubes: Right internal jugular venous catheter tip projects over the superior cavoatrial junction. Lungs: Patient is  rotated to the right. Bibasilar patchy opacities. Similar chronic-appearing perihilar pulmonary vasculature. Pleura: No pneumothorax or pleural effusion. Heart/mediastinum: Similar enlarged cardiomediastinal silhouette. Bones: No acute osseous abnormality. IMPRESSION: 1. Bibasilar patchy opacities, which may represent atelectasis, aspiration, or pneumonia. 2. Similar cardiomegaly. Electronically Signed   By: Limin  Xu M.D.   On: 01/03/2024 14:18    Pending Labs Unresulted Labs (From admission, onward)     Start     Ordered   01/04/24 0500  Hemoglobin A1c  Tomorrow morning,   R        01/03/24 2024   01/04/24 0500  Basic metabolic panel  Tomorrow morning,   R        01/03/24 2031   01/04/24 0500  CBC  Tomorrow morning,   R        01/03/24 2031   01/04/24 0500  TSH  Tomorrow morning,   R        01/03/24 2059   01/04/24 0500  T4, free  Tomorrow morning,   R        01/03/24 2059   01/03/24 1454  Blood culture (routine x 2)  BLOOD CULTURE X 2,   R (with STAT occurrences)      01/03/24 1453            Vitals/Pain Today's Vitals   01/03/24 1915 01/03/24 1920 01/03/24 2023 01/03/24 2036  BP:  102/67  124/70  Pulse:  78  78  Resp:  18  18  Temp:   99.3 F (37.4 C)   TempSrc:   Oral   SpO2:  97%  100%  Weight:      Height:      PainSc: 9    8     Isolation Precautions No active isolations  Medications Medications  ondansetron  (ZOFRAN ) injection 4 mg (4 mg Intravenous Given 01/03/24 1447)  oxyCODONE  (Oxy IR/ROXICODONE ) immediate release tablet 10 mg (has no administration in time range)  heparin  injection 5,000 Units (has no administration in time range)  acetaminophen  (TYLENOL ) tablet 650 mg (has no administration in time range)    Or  acetaminophen  (TYLENOL ) suppository 650 mg (has no administration in time range)  bisacodyl  (DULCOLAX) EC tablet 5 mg (has no administration in time range)  cefTRIAXone  (ROCEPHIN ) 2 g in sodium chloride  0.9 % 100 mL IVPB (has no administration  in time range)  labetalol  (NORMODYNE ) injection 10 mg (10 mg Intravenous Given 01/03/24 1447)  oxyCODONE  (Oxy IR/ROXICODONE ) immediate release tablet 10 mg (10 mg Oral Given 01/03/24 1618)  acetaminophen  (TYLENOL ) tablet 1,000 mg (1,000 mg Oral Given 01/03/24 1618)  cefTRIAXone  (ROCEPHIN ) 1 g in sodium chloride  0.9 % 100 mL IVPB (0 g Intravenous Stopped 01/03/24 1759)  azithromycin  (ZITHROMAX ) 500 mg in sodium chloride  0.9 % 250 mL IVPB (0 mg Intravenous Stopped 01/03/24 1915)    Mobility walks with device     Focused Assessments Cardiac Assessment Handoff:  Cardiac Rhythm: Normal sinus rhythm Lab Results  Component Value Date   CKTOTAL 200 07/31/2023   CKMB 1.3 02/23/2008   TROPONINI <0.01        NO INDICATION OF MYOCARDIAL  INJURY. 02/23/2008   Lab Results  Component Value Date   DDIMER 0.32 07/03/2011   Does the Patient currently have chest pain? No    R Recommendations: See Admitting Provider Note  Report given to:   Additional Notes:

## 2024-01-03 NOTE — ED Notes (Signed)
 Dr. Clairborne at bedside.

## 2024-01-03 NOTE — Plan of Care (Signed)

## 2024-01-03 NOTE — ED Provider Notes (Signed)
 Accepted handoff at shift change from [facility] Please see prior provider note for more detail.   Briefly: Patient is 63 y.o.   DDX: concern for Dialysis patient, 30 minutes of dialysis, chest pain and shortness of breath. Hypertensive urgency / emergency. Newly febrile, concern for developing pneumonia maybe.   Plan: BMP with chronic elevated creatinine consistent with ESRD on dialysis.  RVP negative for COVID, flu, RSV, BNP is elevated, she did not complete dialysis suspect some fluid overload, initial troponin is elevated 20 in context of uncontrolled hypertension some hypertensive urgency/emergency again likely suspected to adequately treated ESRD at dialysis today, her initial CBC is notable for anemia, hemoglobin 10.3 and leukocytopenia, blood cells 1.2, will recheck, repeat CBC with differential show leukocytopenia is real, suspect possible viral suppression given her edema  Independently reviewed chest x-ray, CT chest without contrast which shows asymmetric edema versus inflammation, in context of her fever 102.2, chest pain, shortness of breath will begin presumptive treatment for pneumonia while we continue her workup.  Consulted with hospitalist, spoke with Dr. Chiquita Councilman who agrees to admission, will place consult to nephrology as she will likely need to be dialyzed during her admission.       Nelly Banco, PA-C 01/03/24 1859    Lowery Rue, DO 01/03/24 2249

## 2024-01-03 NOTE — ED Notes (Signed)
Pharmacy tech at the bedside 

## 2024-01-03 NOTE — ED Notes (Signed)
 Pt to CT via stretcher

## 2024-01-03 NOTE — ED Provider Notes (Signed)
 [facility] [facility] Provider Note   CSN: [id number] Arrival date & time: 01/03/24  1317     History  Chief Complaint  Patient presents with   Chest Pain   Shortness of Breath    Carol [name] is a 63 y.o. female with PMHx CAD, anxiety, depression, DM, ESRD on dialysis MWF, GERD, HTN, parkinson's disease who presents to ED concerned for generalized chest pain and SOB starting during dialysis today. Patient estimates that she received around of dialysis before symptoms started. Last full episode of dialysis was 2 days ago during her Wednesday appointment. Patient denies recent fever, cough, nausea, vomiting, diarrhea.   Of note, tech assisted patient with the female urinal - at this time, patient vomited and had a BM at the same time. Tech stating that it was not diarrhea.    Chest Pain Associated symptoms: shortness of breath   Shortness of Breath Associated symptoms: chest pain        Home Medications Prior to Admission medications   Medication Sig Start Date End Date Taking? Authorizing Provider  albuterol  (PROVENTIL  HFA;VENTOLIN  HFA) 108 (90 Base) MCG/ACT inhaler Inhale 2 puffs into the lungs every 4 (four) hours as needed for wheezing or shortness of breath.    [provider]  albuterol  (VENTOLIN  HFA) 108 (90 Base) MCG/ACT inhaler Inhale 1-2 puffs into the lungs every 6 (six) hours as needed for wheezing or shortness of breath. 08/25/23   Teddi Favors, DO  amLODipine  (NORVASC ) 10 MG tablet Take 1 tablet (10 mg total) by mouth daily. 11/01/22   Elmarie Hacking, FNP  aspirin  EC 81 MG tablet Take 1 tablet (81 mg total) by mouth daily. Swallow whole. 05/11/22   Barnetta Liberty, MD  atorvastatin  (LIPITOR ) 80 MG tablet Take 1 tablet (80 mg total) by mouth daily. 03/11/23   Haydee Lipa, MD  budesonide -formoterol  [facility]) 160-4.5 MCG/ACT inhaler Inhale 2 puffs into the lungs in the morning and at bedtime.  11/09/21   [provider]  cetirizine (ZYRTEC) 10 MG tablet Take 10 mg by mouth daily as needed for allergies.    [provider]  Darbepoetin Alfa  (ARANESP ) 60 MCG/0.3ML SOSY injection Inject 0.3 mLs (60 mcg total) into the skin every Thursday at 6pm. 08/08/23   Dezii, Alexandra, DO  doxycycline  (VIBRAMYCIN ) 100 MG capsule Take 1 capsule (100 mg total) by mouth 2 (two) times daily. One po bid x 7 days 11/07/23   Dalene Duck, MD  eszopiclone 3 MG TABS Take 3 mg by mouth at bedtime. Take immediately before bedtime    [provider]  gabapentin  (NEURONTIN ) 100 MG capsule Take 1 capsule (100 mg total) by mouth 2 (two) times daily. 08/14/23 09/13/23  Dezii, Alexandra, DO  glucagon  1 MG injection Follow package directions for low blood sugar. 08/14/23 08/13/24  Dezii, Alexandra, DO  guaiFENesin -dextromethorphan (ROBITUSSIN DM) 100-10 MG/5ML syrup Take 5 mLs by mouth every 4 (four) hours as needed for cough. 08/25/23   Teddi Favors, DO  lidocaine  (LIDODERM ) 5 % Place 1 patch onto the skin daily. Remove & Discard patch within 12 hours or as directed by MD 12/10/23   Dorenda Gandy, MD  naloxone  (NARCAN ) nasal spray 4 mg/0.1 mL If concern for opioid overdose, spray in nostril. Call 911. 08/14/23   Dezii, Alexandra, DO  naphazoline-pheniramine (NAPHCON-A) 0.025-0.3 % ophthalmic solution Place 2 drops into both eyes 2 (two) times daily.    [provider]  nitroGLYCERIN  (  NITROSTAT ) 0.4 MG SL tablet Place 1 tablet (0.4 mg total) under the tongue every 5 (five) minutes as needed for chest pain. 05/11/22 11/01/23  Barnetta Liberty, MD  oxyCODONE -acetaminophen  (PERCOCET/ROXICET) 5-325 MG tablet Take 1 tablet by mouth every 8 (eight) hours as needed for severe pain (pain score 7-10). 12/10/23   Dorenda Gandy, MD  pantoprazole  (PROTONIX ) 40 MG tablet Take 1 tablet (40 mg total) by mouth daily at 12 noon. Patient taking differently: Take 40 mg by mouth at bedtime. 05/11/22    Barnetta Liberty, MD  promethazine -dextromethorphan (PROMETHAZINE -DM) 6.25-15 MG/5ML syrup Take 5 mLs by mouth 4 (four) times daily as needed for cough. 11/07/23   Dalene Duck, MD  triamcinolone  ointment (KENALOG ) 0.1 % 1 Application 2 (two) times daily as needed. 06/29/23   [provider]      Allergies    Penicillins and Ultram  [tramadol ]    Review of Systems   Review of Systems  Respiratory:  Positive for shortness of breath.   Cardiovascular:  Positive for chest pain.    Physical Exam Updated Vital Signs BP (!) 185/82   Pulse (!) 111   Temp (!) 102.2 F (39 C) (Oral)   Resp (!) 22   Ht 5\' 4"  (1.626 m)   Wt 66.7 kg   SpO2 98%   BMI 25.23 kg/m  Physical Exam Vitals and nursing note reviewed.  Constitutional:      General: She is not in acute distress.    Appearance: She is ill-appearing (chronically-ill appearing). She is not toxic-appearing.  HENT:     Head: Normocephalic and atraumatic.     Mouth/Throat:     Mouth: Mucous membranes are moist.     Pharynx: No oropharyngeal exudate or posterior oropharyngeal erythema.  Eyes:     General: No scleral icterus.       Right eye: No discharge.        Left eye: No discharge.     Conjunctiva/sclera: Conjunctivae normal.  Cardiovascular:     Rate and Rhythm: Normal rate and regular rhythm.     Pulses: Normal pulses.     Heart sounds: Normal heart sounds. No murmur heard. Pulmonary:     Effort: Pulmonary effort is normal. No respiratory distress.     Breath sounds: Normal breath sounds. No wheezing, rhonchi or rales.  Abdominal:     General: Bowel sounds are normal.     Palpations: Abdomen is soft. There is no mass.     Tenderness: There is no abdominal tenderness.  Musculoskeletal:     Right lower leg: No edema.     Left lower leg: No edema.  Skin:    General: Skin is warm and dry.     Findings: No rash.  Neurological:     General: No focal deficit present.     Mental Status: She is alert and oriented  to person, place, and time. Mental status is at baseline.  Psychiatric:        Mood and Affect: Mood normal.     ED Results / Procedures / Treatments   Labs (all labs ordered are listed, but only abnormal results are displayed) Labs Reviewed  BASIC METABOLIC PANEL WITH GFR - Abnormal; Notable for the following components:      Result Value   CO2 20 (*)    Creatinine, Ser 4.92 (*)    Calcium  8.8 (*)    GFR, Estimated 9 (*)    All other components within normal limits  CBC -  Abnormal; Notable for the following components:   WBC 1.2 (*)    RBC 3.59 (*)    Hemoglobin 10.3 (*)    HCT 32.9 (*)    RDW 17.8 (*)    All other components within normal limits  BRAIN NATRIURETIC PEPTIDE - Abnormal; Notable for the following components:   B Natriuretic Peptide 1,129.3 (*)    All other components within normal limits  RESP PANEL BY RT-PCR (RSV, FLU A&B, COVID)  RVPGX2  CULTURE, BLOOD (ROUTINE X 2)  CULTURE, BLOOD (ROUTINE X 2)  LACTIC ACID, PLASMA  LACTIC ACID, PLASMA  CBC WITH DIFFERENTIAL/PLATELET  TROPONIN I (HIGH SENSITIVITY)  TROPONIN I (HIGH SENSITIVITY)    EKG EKG Interpretation Date/Time:  Friday Jan 03 2024 13:52:58 EDT Ventricular Rate:  115 PR Interval:  140 QRS Duration:  78 QT Interval:  337 QTC Calculation: 467 R Axis:   66  Text Interpretation: Sinus tachycardia Probable left atrial enlargement Anterior infarct, old when compared to prior, faster rate No STEMI Confirmed by Wynell Heath ([id number]) on 01/03/2024 2:07:55 PM  Radiology DG Chest Portable 1 View Result Date: 01/03/2024 CLINICAL DATA:  Chest pain EXAM: PORTABLE CHEST 1 VIEW COMPARISON:  Chest radiograph dated 12/10/2023 FINDINGS: Lines/tubes: Right internal jugular venous catheter tip projects over the superior cavoatrial junction. Lungs: Patient is rotated to the right. Bibasilar patchy opacities. Similar chronic-appearing perihilar pulmonary vasculature. Pleura: No pneumothorax or pleural effusion.  Heart/mediastinum: Similar enlarged cardiomediastinal silhouette. Bones: No acute osseous abnormality. IMPRESSION: 1. Bibasilar patchy opacities, which may represent atelectasis, aspiration, or pneumonia. 2. Similar cardiomegaly. Electronically Signed   By: Limin  Xu M.D.   On: 01/03/2024 14:18    Procedures Procedures    Medications Ordered in ED Medications  ondansetron  (ZOFRAN ) injection 4 mg (4 mg Intravenous Given 01/03/24 1447)  oxyCODONE  (Oxy IR/ROXICODONE ) immediate release tablet 10 mg (has no administration in time range)  acetaminophen  (TYLENOL ) tablet 1,000 mg (has no administration in time range)  labetalol  (NORMODYNE ) injection 10 mg (10 mg Intravenous Given 01/03/24 1447)    ED Course/ Medical Decision Making/ A&P Clinical Course as of 01/03/24 1512  Fri Jan 03, 2024  1508 Dialysis patient, 30 minutes of dialysis, chest pain and shortness of breath. Hypertensive urgency / emergency. Newly febrile, concern for developing pneumonia maybe.  [CP]    Clinical Course User Index [CP] Nelly Banco, PA-C                                 Medical Decision Making Amount and/or Complexity of Data Reviewed Labs: ordered. Radiology: ordered.   This patient presents to the ED for concern of chest pain, this involves an extensive number of treatment options, and is a complaint that carries with it a high risk of complications and morbidity.  The differential diagnosis includes acute coronary syndrome, congestive heart failure, pericarditis, pneumonia, pulmonary embolism, tension pneumothorax, esophageal rupture, aortic dissection, cardiac tamponade, musculoskeletal   Co morbidities that complicate the patient evaluation  CAD, anxiety, depression, DM, ESRD on dialysis MWF, GERD, HTN, parkinson's disease   Additional history obtained:  PCP with [facility]   Problem List / ED Course / Critical interventions / Medication management  Patient presented for chest pain and  SOB that started during dialysis today.  Denies any other infectious symptoms today.  Physical exam with tachycardia 111.  Patient also spiked a temperature while in the ED at 102.7F.  Patient also hypertensive  at 200/87. Staffed with Dr. Manus Sellers who recommends cardiac workup and labetalol  for patient's tachycardia and HTN.  I Ordered, and personally interpreted labs.  CBC without leukocytosis - WBC 1.2. differential pending. There is also anemia with hgb 10.3 near patient's baseline.  BMP with creatinine elevated near patient's baseline-currently at 4.92.  Rest of lab workup pending. The patient was maintained on a cardiac monitor.  I personally viewed and interpreted the EKG/cardiac monitored which showed an underlying rhythm of: Sinus tachycardia. I ordered imaging studies including chest xray to assess for process contributing to patient's symptoms. I independently visualized and interpreted imaging.  Radiology read without specific diagnosis-will proceed to CT chest. I have reviewed the patients home medicines and have made adjustments as needed   Social Determinants of Health:  none  3PM Care of Carol [name] transferred to PA Prosperi at the end of my shift as the patient will require reassessment once labs/imaging have resulted. Patient presentation, ED course, and plan of care discussed with review of all pertinent labs and imaging. Please see his/her note for further details regarding further ED course and disposition. Plan at time of handoff is reassess patient after labs and imaging. Patient will probably need hospital admission. This may be altered or completely changed at the discretion of the oncoming team pending results of further workup.        Final Clinical Impression(s) / ED Diagnoses Final diagnoses:  None    Rx / DC Orders ED Discharge Orders     None         [place], [place] 01/03/24 1512    Tegeler, Marine Sia, MD 01/03/24 1739

## 2024-01-03 NOTE — H&P (Addendum)
 History and Physical    Patient: Carol [name] ZOX:[id number] DOB: [birth year] DOA: 01/03/2024 DOS: the patient was seen and examined on 01/03/2024 PCP: [facility], [facility]  Patient coming from: Dialysis  Chief Complaint:  Chief Complaint  Patient presents with   Chest Pain   Shortness of Breath   HPI: Carol [name] is a 63 y.o. female with medical history significant for ESRD HD MWF x 3months.   She has a vascular access in her left arm which has not yet been used.  She is still having dialysis through the access in her upper chest.  The patient reports that she has been having subjective fevers and shaking chills for the last 2 to 3 days at home.  She is also had some mild shortness of breath no cough. She had a little diarrhea today at dialysis she says she just got much worse all of a sudden.  She started having chest pain. She vomited also had more diarrhea.  She was sent to the hospital and her temp was 102.2.  She had fever and leukopenia but her lactate was normal.  She did not receive fluids because she is a hemodialysis patient but she did get started on Rocephin  and Zithromax .  She had a CT of her chest which was significant for mild volume overload.  The patient was sent to the ER prior to completing her hemodialysis today. She will be admitted for fever and early sepsis.     Review of Systems: As mentioned in the history of present illness. All other systems reviewed and are negative. 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   Chronic back pain    Depression    Diabetes mellitus    ESRD (end stage renal disease) on dialysis [facility])    M,W,F   GERD (gastroesophageal reflux disease)    Heart murmur    Hypertension    NSTEMI (non-ST elevated myocardial infarction) (HCC)  12/20/2021   Palpitations    Parkinson's disease (HCC)    Past Surgical History:  Procedure Laterality Date   AV FISTULA PLACEMENT Left 08/01/2023   Procedure: LEFT ARM BRACHIOBASILIC FISTULA CREATION;  Surgeon: Philipp Brawn, MD;  Location: [facility] OR;  Service: Vascular;  Laterality: Left;   BALLOON DILATION N/A 11/06/2022   Procedure: BALLOON DILATION;  Surgeon: Sergio Dandy, MD;  Location: [facility] ENDOSCOPY;  Service: Gastroenterology;  Laterality: N/A;   CARPAL TUNNEL RELEASE     COLONOSCOPY WITH PROPOFOL  N/A 11/06/2022   Procedure: COLONOSCOPY WITH PROPOFOL ;  Surgeon: Sergio Dandy, MD;  Location: [facility] ENDOSCOPY;  Service: Gastroenterology;  Laterality: N/A;   ESOPHAGOGASTRODUODENOSCOPY (EGD) WITH PROPOFOL  N/A 11/06/2022   Procedure: ESOPHAGOGASTRODUODENOSCOPY (EGD) WITH PROPOFOL ;  Surgeon: Sergio Dandy, MD;  Location: [facility] ENDOSCOPY;  Service: Gastroenterology;  Laterality: N/A;   INSERTION OF DIALYSIS CATHETER Right 08/01/2023   Procedure: INSERTION OF TUNNELED DIALYSIS CATHETER;  Surgeon: Philipp Brawn, MD;  Location: [facility] OR;  Service: Vascular;  Laterality: Right;   IR FLUORO GUIDE CV LINE RIGHT  01/26/2022   IR US  GUIDE VASC ACCESS RIGHT  01/26/2022   LEFT HEART CATH AND CORONARY ANGIOGRAPHY N/A 12/20/2021   Procedure: LEFT HEART CATH AND CORONARY ANGIOGRAPHY;  Surgeon: Odie Benne, MD;  Location: [facility] INVASIVE CV LAB;  Service: Cardiovascular;  Laterality:  N/A;   OPEN REDUCTION INTERNAL FIXATION (ORIF) FINGER WITH RADIAL BONE GRAFT Right 09/12/2015   Procedure: RIGHT MIDDLE FINGER CLOSED REDUCTION AND PINNING;  Surgeon: Arvil Birks, MD;  Location: [facility] OR;  Service: Orthopedics;  Laterality: Right;   TONSILLECTOMY     TUBAL LIGATION     Social History:  reports that she has been smoking cigarettes. She has a 0.8 pack-year smoking history. She has been exposed to tobacco smoke. She has never used smokeless tobacco. She reports current alcohol use. She reports current  drug use. Drug: Cocaine .  Allergies  Allergen Reactions   Penicillins Shortness Of Breath and Other (See Comments)    Caused yeast infection Has patient had a PCN reaction causing immediate rash, facial/tongue/throat swelling, SOB or lightheadedness with hypotension: Yes Has patient had a PCN reaction causing severe rash involving mucus membranes or skin necrosis: No Has patient had a PCN reaction that required hospitalization pt was in the hospital at the time of the reaction Has patient had a PCN reaction occurring within the last 10 years: No If all of the above answers are "NO", then may proceed with Cephalosp   Ultram  [Tramadol ] Other (See Comments)    Made her tongue raw    Family History  Problem Relation Age of Onset   Diabetes Mother    Kidney disease Mother    Seizures Father    Hypertension Father    Stroke Father    Asthma Other     Prior to Admission medications   Medication Sig Start Date End Date Taking? Authorizing Provider  albuterol  (PROVENTIL  HFA;VENTOLIN  HFA) 108 (90 Base) MCG/ACT inhaler Inhale 2 puffs into the lungs every 4 (four) hours as needed for wheezing or shortness of breath.    [provider]  albuterol  (VENTOLIN  HFA) 108 (90 Base) MCG/ACT inhaler Inhale 1-2 puffs into the lungs every 6 (six) hours as needed for wheezing or shortness of breath. 08/25/23   Teddi Favors, DO  amLODipine  (NORVASC ) 10 MG tablet Take 1 tablet (10 mg total) by mouth daily. 11/01/22   Elmarie Hacking, FNP  aspirin  EC 81 MG tablet Take 1 tablet (81 mg total) by mouth daily. Swallow whole. 05/11/22   Barnetta Liberty, MD  atorvastatin  (LIPITOR ) 80 MG tablet Take 1 tablet (80 mg total) by mouth daily. 03/11/23   Haydee Lipa, MD  budesonide -formoterol  [facility]) 160-4.5 MCG/ACT inhaler Inhale 2 puffs into the lungs in the morning and at bedtime. 11/09/21   [provider]  cetirizine (ZYRTEC) 10 MG tablet Take 10 mg by mouth daily as needed for allergies.     [provider]  Darbepoetin Alfa  (ARANESP ) 60 MCG/0.3ML SOSY injection Inject 0.3 mLs (60 mcg total) into the skin every Thursday at 6pm. 08/08/23   Dezii, Alexandra, DO  doxycycline  (VIBRAMYCIN ) 100 MG capsule Take 1 capsule (100 mg total) by mouth 2 (two) times daily. One po bid x 7 days 11/07/23   Dalene Duck, MD  eszopiclone 3 MG TABS Take 3 mg by mouth at bedtime. Take immediately before bedtime    [provider]  gabapentin  (NEURONTIN ) 100 MG capsule Take 1 capsule (100 mg total) by mouth 2 (two) times daily. 08/14/23 09/13/23  Dezii, Alexandra, DO  glucagon  1 MG injection Follow package directions for low blood sugar. 08/14/23 08/13/24  Dezii, Alexandra, DO  guaiFENesin -dextromethorphan (ROBITUSSIN DM) 100-10 MG/5ML syrup Take 5 mLs by mouth every 4 (four) hours as needed for cough. 08/25/23   Teddi Favors,  DO  lidocaine  (LIDODERM ) 5 % Place 1 patch onto the skin daily. Remove & Discard patch within 12 hours or as directed by MD 12/10/23   Dorenda Gandy, MD  naloxone  (NARCAN ) nasal spray 4 mg/0.1 mL If concern for opioid overdose, spray in nostril. Call 911. 08/14/23   Dezii, Alexandra, DO  naphazoline-pheniramine (NAPHCON-A) 0.025-0.3 % ophthalmic solution Place 2 drops into both eyes 2 (two) times daily.    [provider]  nitroGLYCERIN  (NITROSTAT ) 0.4 MG SL tablet Place 1 tablet (0.4 mg total) under the tongue every 5 (five) minutes as needed for chest pain. 05/11/22 11/01/23  Barnetta Liberty, MD  oxyCODONE -acetaminophen  (PERCOCET/ROXICET) 5-325 MG tablet Take 1 tablet by mouth every 8 (eight) hours as needed for severe pain (pain score 7-10). 12/10/23   Dorenda Gandy, MD  pantoprazole  (PROTONIX ) 40 MG tablet Take 1 tablet (40 mg total) by mouth daily at 12 noon. Patient taking differently: Take 40 mg by mouth at bedtime. 05/11/22   Barnetta Liberty, MD  promethazine -dextromethorphan (PROMETHAZINE -DM) 6.25-15 MG/5ML syrup Take 5 mLs by mouth 4 (four) times  daily as needed for cough. 11/07/23   Dalene Duck, MD  triamcinolone  ointment (KENALOG ) 0.1 % 1 Application 2 (two) times daily as needed. 06/29/23   [provider]    Physical Exam: Vitals:   01/03/24 1800 01/03/24 1801 01/03/24 1920 01/03/24 2023  BP: (!) 107/59  102/67   Pulse: 81  78   Resp:   18   Temp:  (!) 100.8 F (38.2 C)  99.3 F (37.4 C)  TempSrc:  Oral  Oral  SpO2: 97%  97%   Weight:      Height:       Physical Exam:  General: No acute distress, well developed, well nourished HEENT: Normocephalic, atraumatic, PERRL Cardiovascular: Normal rate and rhythm. Distal pulses intact. Pulmonary: Normal pulmonary effort, normal breath sounds Gastrointestinal: Nondistended abdomen, soft, non-tender, normoactive bowel sounds Musculoskeletal:No lower ext edema Lower back and sacrum both sides are tender to touch  Skin: Skin is hot to touch Neuro: No focal deficits noted, AAOx3. PSYCH: Attentive and cooperative  Data Reviewed:  Results for orders placed or performed during the hospital encounter of 01/03/24 (from the past 24 hours)  Resp panel by RT-PCR (RSV, Flu A&B, Covid) Anterior Nasal Swab     Status: None   Collection Time: 01/03/24  1:33 PM   Specimen: Anterior Nasal Swab  Result Value Ref Range   SARS Coronavirus 2 by RT PCR NEGATIVE NEGATIVE   Influenza A by PCR NEGATIVE NEGATIVE   Influenza B by PCR NEGATIVE NEGATIVE   Resp Syncytial Virus by PCR NEGATIVE NEGATIVE  Basic metabolic panel     Status: Abnormal   Collection Time: 01/03/24  2:07 PM  Result Value Ref Range   Sodium 138 135 - 145 mmol/L   Potassium 3.7 3.5 - 5.1 mmol/L   Chloride 104 98 - 111 mmol/L   CO2 20 (L) 22 - 32 mmol/L   Glucose, Bld 79 70 - 99 mg/dL   BUN 17 8 - 23 mg/dL   Creatinine, Ser 6.57 (H) 0.44 - 1.00 mg/dL   Calcium  8.8 (L) 8.9 - 10.3 mg/dL   GFR, Estimated 9 (L) >60 mL/min   Anion gap 14 5 - 15  CBC     Status: Abnormal   Collection Time: 01/03/24  2:07 PM   Result Value Ref Range   WBC 1.2 (LL) 4.0 - 10.5 K/uL   RBC 3.59 (L) 3.87 -  5.11 MIL/uL   Hemoglobin 10.3 (L) 12.0 - 15.0 g/dL   HCT 16.1 (L) 09.6 - 04.5 %   MCV 91.6 80.0 - 100.0 fL   MCH 28.7 26.0 - 34.0 pg   MCHC 31.3 30.0 - 36.0 g/dL   RDW 40.9 (H) 81.1 - 91.4 %   Platelets 154 150 - 400 K/uL   nRBC 0.0 0.0 - 0.2 %  Troponin I (High Sensitivity)     Status: Abnormal   Collection Time: 01/03/24  2:07 PM  Result Value Ref Range   Troponin I (High Sensitivity) 20 (H) <18 ng/L  Brain natriuretic peptide     Status: Abnormal   Collection Time: 01/03/24  2:07 PM  Result Value Ref Range   B Natriuretic Peptide 1,129.3 (H) 0.0 - 100.0 pg/mL  CBC with Differential     Status: Abnormal   Collection Time: 01/03/24  2:07 PM  Result Value Ref Range   WBC 1.3 (LL) 4.0 - 10.5 K/uL   RBC 3.55 (L) 3.87 - 5.11 MIL/uL   Hemoglobin 10.5 (L) 12.0 - 15.0 g/dL   HCT 78.2 (L) 95.6 - 21.3 %   MCV 91.5 80.0 - 100.0 fL   MCH 29.6 26.0 - 34.0 pg   MCHC 32.3 30.0 - 36.0 g/dL   RDW 08.6 (H) 57.8 - 46.9 %   Platelets 162 150 - 400 K/uL   nRBC 0.0 0.0 - 0.2 %   Neutrophils Relative % 80 %   Neutro Abs 1.0 (L) 1.7 - 7.7 K/uL   Lymphocytes Relative 14 %   Lymphs Abs 0.2 (L) 0.7 - 4.0 K/uL   Monocytes Relative 0 %   Monocytes Absolute 0.0 (L) 0.1 - 1.0 K/uL   Eosinophils Relative 5 %   Eosinophils Absolute 0.1 0.0 - 0.5 K/uL   Basophils Relative 1 %   Basophils Absolute 0.0 0.0 - 0.1 K/uL   nRBC 1 (H) 0 /100 WBC   Abs Immature Granulocytes 0.00 0.00 - 0.07 K/uL   Tear Drop Cells PRESENT    Polychromasia PRESENT   Troponin I (High Sensitivity)     Status: Abnormal   Collection Time: 01/03/24  4:41 PM  Result Value Ref Range   Troponin I (High Sensitivity) 25 (H) <18 ng/L  I-Stat Lactic Acid, ED     Status: None   Collection Time: 01/03/24  4:47 PM  Result Value Ref Range   Lactic Acid, Venous 1.5 0.5 - 1.9 mmol/L  Urinalysis, Routine w reflex microscopic -Urine, Clean Catch     Status:  Abnormal   Collection Time: 01/03/24  6:30 PM  Result Value Ref Range   Color, Urine YELLOW YELLOW   APPearance HAZY (A) CLEAR   Specific Gravity, Urine 1.008 1.005 - 1.030   pH 8.0 5.0 - 8.0   Glucose, UA NEGATIVE NEGATIVE mg/dL   Hgb urine dipstick NEGATIVE NEGATIVE   Bilirubin Urine NEGATIVE NEGATIVE   Ketones, ur NEGATIVE NEGATIVE mg/dL   Protein, ur 629 (A) NEGATIVE mg/dL   Nitrite NEGATIVE NEGATIVE   Leukocytes,Ua NEGATIVE NEGATIVE   RBC / HPF 0-5 0 - 5 RBC/hpf   WBC, UA 0-5 0 - 5 WBC/hpf   Bacteria, UA RARE (A) NONE SEEN   Squamous Epithelial / HPF 0-5 0 - 5 /HPF   Mucus PRESENT      Assessment and Plan: Fever/ leukopenia - sepsis suspected. Source unknown.  She has a vascular access in her upper chest and worsened back pain. - Blood cultures pending  -  Chest CT with now pneumonia - Will image her back r/o osteo - Continue Rocephin  for now - COVID, flu, RSV negative  2. ESRD - HD MWF - Nephrology consulted  3. Abnormal thyroid nodule found on CT - Thyroid ultrasound ordered    Advance Care Planning:   Code Status: Prior  The patient names her daughter Genevia Kern as her surrogate decision maker and she wants to be full code  Consults: nephrology  Family Communication: none  Severity of Illness: The appropriate patient status for this patient is INPATIENT. Inpatient status is judged to be reasonable and necessary in order to provide the required intensity of service to ensure the patient's safety. The patient's presenting symptoms, physical exam findings, and initial radiographic and laboratory data in the context of their chronic comorbidities is felt to place them at high risk for further clinical deterioration. Furthermore, it is not anticipated that the patient will be medically stable for discharge from the hospital within 2 midnights of admission.   * I certify that at the point of admission it is my clinical judgment that the patient will require inpatient  hospital care spanning beyond 2 midnights from the point of admission due to high intensity of service, high risk for further deterioration and high frequency of surveillance required.*  Author: Willadean Hark, MD 01/03/2024 8:24 PM  For on call review [email or web address].

## 2024-01-03 NOTE — ED Triage Notes (Signed)
 Pt bib ems from dialysis c.o chest pain/sob about 30 mins PTA.  Pt did not receive dialysis today. Last dialysis was wednesday   240/80 HR 106 94% on 3L  324 ASA given  0.4 nitroglycerin , no relief of pain

## 2024-01-04 ENCOUNTER — Encounter (HOSPITAL_COMMUNITY): Payer: Self-pay | Admitting: Internal Medicine

## 2024-01-04 ENCOUNTER — Inpatient Hospital Stay (HOSPITAL_COMMUNITY)

## 2024-01-04 DIAGNOSIS — E041 Nontoxic single thyroid nodule: Secondary | ICD-10-CM

## 2024-01-04 DIAGNOSIS — B377 Candidal sepsis: Secondary | ICD-10-CM | POA: Diagnosis not present

## 2024-01-04 DIAGNOSIS — D72819 Decreased white blood cell count, unspecified: Secondary | ICD-10-CM | POA: Diagnosis not present

## 2024-01-04 DIAGNOSIS — K8020 Calculus of gallbladder without cholecystitis without obstruction: Secondary | ICD-10-CM | POA: Diagnosis not present

## 2024-01-04 DIAGNOSIS — A419 Sepsis, unspecified organism: Secondary | ICD-10-CM

## 2024-01-04 DIAGNOSIS — N186 End stage renal disease: Secondary | ICD-10-CM | POA: Diagnosis not present

## 2024-01-04 DIAGNOSIS — I10 Essential (primary) hypertension: Secondary | ICD-10-CM

## 2024-01-04 DIAGNOSIS — Z992 Dependence on renal dialysis: Secondary | ICD-10-CM

## 2024-01-04 DIAGNOSIS — R197 Diarrhea, unspecified: Secondary | ICD-10-CM

## 2024-01-04 DIAGNOSIS — R935 Abnormal findings on diagnostic imaging of other abdominal regions, including retroperitoneum: Secondary | ICD-10-CM | POA: Diagnosis not present

## 2024-01-04 DIAGNOSIS — D638 Anemia in other chronic diseases classified elsewhere: Secondary | ICD-10-CM

## 2024-01-04 LAB — GLUCOSE, CAPILLARY
Glucose-Capillary: 71 mg/dL (ref 70–99)
Glucose-Capillary: 87 mg/dL (ref 70–99)
Glucose-Capillary: 65 mg/dL — ABNORMAL LOW (ref 70–99)
Glucose-Capillary: 76 mg/dL (ref 70–99)

## 2024-01-04 LAB — BLOOD CULTURE ID PANEL (REFLEXED) - BCID2
A.calcoaceticus-baumannii: NOT DETECTED
Bacteroides fragilis: NOT DETECTED
Candida albicans: NOT DETECTED
Candida auris: NOT DETECTED
Candida glabrata: NOT DETECTED
Candida krusei: NOT DETECTED
Candida parapsilosis: NOT DETECTED
Candida tropicalis: DETECTED — AB
Cryptococcus neoformans/gattii: NOT DETECTED
Enterobacter cloacae complex: NOT DETECTED
Enterobacterales: NOT DETECTED
Enterococcus Faecium: NOT DETECTED
Enterococcus faecalis: NOT DETECTED
Escherichia coli: NOT DETECTED
Haemophilus influenzae: NOT DETECTED
Klebsiella aerogenes: NOT DETECTED
Klebsiella oxytoca: NOT DETECTED
Klebsiella pneumoniae: NOT DETECTED
Listeria monocytogenes: NOT DETECTED
Neisseria meningitidis: NOT DETECTED
Proteus species: NOT DETECTED
Pseudomonas aeruginosa: NOT DETECTED
Salmonella species: NOT DETECTED
Serratia marcescens: NOT DETECTED
Staphylococcus aureus (BCID): NOT DETECTED
Staphylococcus epidermidis: NOT DETECTED
Staphylococcus lugdunensis: NOT DETECTED
Staphylococcus species: NOT DETECTED
Stenotrophomonas maltophilia: NOT DETECTED
Streptococcus agalactiae: NOT DETECTED
Streptococcus pneumoniae: NOT DETECTED
Streptococcus pyogenes: NOT DETECTED
Streptococcus species: NOT DETECTED

## 2024-01-04 LAB — BASIC METABOLIC PANEL WITH GFR
Anion gap: 12 (ref 5–15)
BUN: 26 mg/dL — ABNORMAL HIGH (ref 8–23)
CO2: 23 mmol/L (ref 22–32)
Calcium: 8.1 mg/dL — ABNORMAL LOW (ref 8.9–10.3)
Chloride: 101 mmol/L (ref 98–111)
Creatinine, Ser: 5.97 mg/dL — ABNORMAL HIGH (ref 0.44–1.00)
GFR, Estimated: 8 mL/min — ABNORMAL LOW (ref >60)
Glucose, Bld: 93 mg/dL (ref 70–99)
Potassium: 4.4 mmol/L (ref 3.5–5.1)
Sodium: 136 mmol/L (ref 135–145)

## 2024-01-04 LAB — HEPATITIS B SURFACE ANTIGEN: Hepatitis B Surface Ag: NONREACTIVE

## 2024-01-04 LAB — CBC
HCT: 27.9 % — ABNORMAL LOW (ref 36.0–46.0)
Hemoglobin: 9 g/dL — ABNORMAL LOW (ref 12.0–15.0)
MCH: 29.7 pg (ref 26.0–34.0)
MCHC: 32.3 g/dL (ref 30.0–36.0)
MCV: 92.1 fL (ref 80.0–100.0)
Platelets: 110 10*3/uL — ABNORMAL LOW (ref 150–400)
RBC: 3.03 MIL/uL — ABNORMAL LOW (ref 3.87–5.11)
RDW: 18.2 % — ABNORMAL HIGH (ref 11.5–15.5)
WBC: 15 10*3/uL — ABNORMAL HIGH (ref 4.0–10.5)
nRBC: 0 % (ref 0.0–0.2)

## 2024-01-04 LAB — HEPATIC FUNCTION PANEL
ALT: 44 U/L (ref 0–44)
AST: 112 U/L — ABNORMAL HIGH (ref 15–41)
Albumin: 2.5 g/dL — ABNORMAL LOW (ref 3.5–5.0)
Alkaline Phosphatase: 124 U/L (ref 38–126)
Bilirubin, Direct: 0.2 mg/dL (ref 0.0–0.2)
Indirect Bilirubin: 0.8 mg/dL (ref 0.3–0.9)
Total Bilirubin: 1 mg/dL (ref 0.0–1.2)
Total Protein: 6.3 g/dL — ABNORMAL LOW (ref 6.5–8.1)

## 2024-01-04 LAB — TSH: TSH: 0.828 u[IU]/mL (ref 0.350–4.500)

## 2024-01-04 LAB — HEMOGLOBIN A1C
Hgb A1c MFr Bld: 4.1 % — ABNORMAL LOW (ref 4.8–5.6)
Mean Plasma Glucose: 70.97 mg/dL

## 2024-01-04 LAB — T4, FREE: Free T4: 0.79 ng/dL (ref 0.61–1.12)

## 2024-01-04 MED ORDER — IPRATROPIUM-ALBUTEROL 0.5-2.5 (3) MG/3ML IN SOLN: 3.0000 mL | RESPIRATORY_TRACT | Status: DC | PRN

## 2024-01-04 MED ORDER — GABAPENTIN 100 MG PO CAPS
100.0000 mg | ORAL_CAPSULE | Freq: Two times a day (BID) | ORAL | Status: DC
  Administered 2024-01-04 – 2024-01-16 (×24): 100 mg via ORAL
  Filled 2024-01-04 (×26): qty 1

## 2024-01-04 MED ORDER — PANTOPRAZOLE SODIUM 40 MG PO TBEC
40.0000 mg | DELAYED_RELEASE_TABLET | Freq: Every day | ORAL | Status: DC
Start: 2024-01-04 — End: 2024-01-16
  Administered 2024-01-04 – 2024-01-15 (×12): 40 mg via ORAL
  Filled 2024-01-04 (×12): qty 1

## 2024-01-04 MED ORDER — SODIUM CHLORIDE 0.9 % IV SOLN
100.0000 mg | INTRAVENOUS | Status: DC
  Administered 2024-01-04 – 2024-01-12 (×9): 100 mg via INTRAVENOUS
  Filled 2024-01-04 (×10): qty 5

## 2024-01-04 MED ORDER — AMLODIPINE BESYLATE 10 MG PO TABS
10.0000 mg | ORAL_TABLET | Freq: Every day | ORAL | Status: DC
  Administered 2024-01-04 – 2024-01-05 (×2): 10 mg via ORAL
  Filled 2024-01-04 (×2): qty 1

## 2024-01-04 MED ORDER — ATORVASTATIN CALCIUM 80 MG PO TABS
80.0000 mg | ORAL_TABLET | Freq: Every day | ORAL | Status: DC
  Administered 2024-01-04 – 2024-01-16 (×11): 80 mg via ORAL
  Filled 2024-01-04 (×13): qty 1

## 2024-01-04 MED ORDER — ASPIRIN 81 MG PO TBEC
81.0000 mg | DELAYED_RELEASE_TABLET | Freq: Every day | ORAL | Status: DC
  Administered 2024-01-04 – 2024-01-16 (×10): 81 mg via ORAL
  Filled 2024-01-04 (×12): qty 1

## 2024-01-04 MED ORDER — OXYCODONE HCL 5 MG PO TABS
5.0000 mg | ORAL_TABLET | Freq: Four times a day (QID) | ORAL | Status: DC | PRN
  Administered 2024-01-04 – 2024-01-11 (×18): 5 mg via ORAL
  Filled 2024-01-04 (×17): qty 1

## 2024-01-04 MED ORDER — CHLORHEXIDINE GLUCONATE CLOTH 2 % EX PADS
6.0000 | MEDICATED_PAD | Freq: Every day | CUTANEOUS | Status: DC
  Administered 2024-01-08 – 2024-01-10 (×3): 6 via TOPICAL

## 2024-01-04 NOTE — Progress Notes (Addendum)
 PHARMACY - PHYSICIAN COMMUNICATION CRITICAL VALUE ALERT - BLOOD CULTURE IDENTIFICATION (BCID)   Assessment:  Carol [name] is an 63 y.o. female who presented to [facility] on 01/03/2024 with a chief complaint of sepsis. Patient recently started HD in past few months and has a tunneled HD catheter as well as AV fistula/graft in her L arm.   5/16 Bcx: 2/4 bottles (same set) yeast- identified as Candida tropicalis on BCID   Name of physician (or Provider) Contacted: Drs. Anastasia Balo, Ravishankar  Current antibiotics: ceftriaxone  2g IV Q24H   Changes to prescribed antibiotics recommended: Add micafungin 100 mg IV Q24H- ID to see  Recommendations accepted by provider  Results for orders placed or performed during the hospital encounter of 01/03/24  Blood Culture ID Panel (Reflexed) (Collected: 01/03/2024  2:59 PM)  Result Value Ref Range   Enterococcus faecalis NOT DETECTED NOT DETECTED   Enterococcus Faecium NOT DETECTED NOT DETECTED   Listeria monocytogenes NOT DETECTED NOT DETECTED   Staphylococcus species NOT DETECTED NOT DETECTED   Staphylococcus aureus (BCID) NOT DETECTED NOT DETECTED   Staphylococcus epidermidis NOT DETECTED NOT DETECTED   Staphylococcus lugdunensis NOT DETECTED NOT DETECTED   Streptococcus species NOT DETECTED NOT DETECTED   Streptococcus agalactiae NOT DETECTED NOT DETECTED   Streptococcus pneumoniae NOT DETECTED NOT DETECTED   Streptococcus pyogenes NOT DETECTED NOT DETECTED   A.calcoaceticus-baumannii NOT DETECTED NOT DETECTED   Bacteroides fragilis NOT DETECTED NOT DETECTED   Enterobacterales NOT DETECTED NOT DETECTED   Enterobacter cloacae complex NOT DETECTED NOT DETECTED   Escherichia coli NOT DETECTED NOT DETECTED   Klebsiella aerogenes NOT DETECTED NOT DETECTED   Klebsiella oxytoca NOT DETECTED NOT DETECTED   Klebsiella pneumoniae NOT DETECTED NOT DETECTED   Proteus species NOT DETECTED NOT DETECTED   Salmonella species NOT DETECTED  NOT DETECTED   Serratia marcescens NOT DETECTED NOT DETECTED   Haemophilus influenzae NOT DETECTED NOT DETECTED   Neisseria meningitidis NOT DETECTED NOT DETECTED   Pseudomonas aeruginosa NOT DETECTED NOT DETECTED   Stenotrophomonas maltophilia NOT DETECTED NOT DETECTED   Candida albicans NOT DETECTED NOT DETECTED   Candida auris NOT DETECTED NOT DETECTED   Candida glabrata NOT DETECTED NOT DETECTED   Candida krusei NOT DETECTED NOT DETECTED   Candida parapsilosis NOT DETECTED NOT DETECTED   Candida tropicalis DETECTED (A) NOT DETECTED   Cryptococcus neoformans/gattii NOT DETECTED NOT DETECTED    Chrystie Crass, PharmD Clinical Pharmacist  01/04/2024 7:47 PM

## 2024-01-04 NOTE — Plan of Care (Signed)

## 2024-01-04 NOTE — Consult Note (Addendum)
 NAME: Carol [name]  DOB: [birth year]  MRN: [id number]  Date/Time: 01/04/2024 7:44 PM  REQUESTING PROVIDER: Dr. Butch Cashing Subjective:  REASON FOR CONSULT: candidemia ? Carol [name] is a 63 y.o. with a history of ESRD on HD, DM, HTN, presented from Dialysis center on Friday 5/16 with fever, nausea and vomiting, had some chest pain and sob Pt has been having subjective fever and chills since Wednesday. Pt has low back pain worse than before She has a cough, not bringing up sputum, chronic diarrhea She has a HD cath since Dec 2024 A fistula was created in Dec but not been used Has some tooth pain Vitals in the ED  01/03/24 13:20  Temp 98.4 F (36.9 C)  Pulse Rate 111 !  Resp 29 !  SpO2 95 %  O2 Flow Rate (L/min) 3 L/min  Temp then increased to 102.2  Latest Reference Range & Units 01/03/24 14:07  WBC 4.0 - 10.5 K/uL 4.0 - 10.5 K/uL 1.3 (LL) [1] 1.2 (LL) [2]  Hemoglobin 12.0 - 15.0 g/dL 40.9 - 81.1 g/dL 91.4 (L) 78.2 (L)  HCT 36.0 - 46.0 % 36.0 - 46.0 % 32.5 (L) 32.9 (L)  Platelets 150 - 400 K/uL 150 - 400 K/uL 162 154  Creatinine 0.44 - 1.00 mg/dL 9.56 (H)  CXR questioned bilateral lower lobe infiltrate Started on Iv ceftraixoen and azithro CT chest fluid over load Vs septal inflammation HAD MRI of lumbar spine No discitis L2 schmorl's node was noted  Blood culture came back psotive for candida tropicalis I am seeing the patient for the same    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   Chronic back pain    Depression    Diabetes mellitus    ESRD (end stage renal disease) on dialysis [facility])    M,W,F   GERD (gastroesophageal reflux disease)    Heart murmur    Hypertension    NSTEMI (non-ST elevated myocardial infarction) (HCC) 12/20/2021   Palpitations    Parkinson's disease (HCC)     Past Surgical History:  Procedure Laterality Date   AV  FISTULA PLACEMENT Left 08/01/2023   Procedure: LEFT ARM BRACHIOBASILIC FISTULA CREATION;  Surgeon: Philipp Brawn, MD;  [facility];  Service: Vascular;  Laterality: Left;   BALLOON DILATION N/A 11/06/2022   Procedure: BALLOON DILATION;  Surgeon: Sergio Dandy, MD;  Location: [facility] ENDOSCOPY;  Service: Gastroenterology;  Laterality: N/A;   CARPAL TUNNEL RELEASE     COLONOSCOPY WITH PROPOFOL  N/A 11/06/2022   Procedure: COLONOSCOPY WITH PROPOFOL ;  Surgeon: Sergio Dandy, MD;  Location: [facility] ENDOSCOPY;  Service: Gastroenterology;  Laterality: N/A;   ESOPHAGOGASTRODUODENOSCOPY (EGD) WITH PROPOFOL  N/A 11/06/2022   Procedure: ESOPHAGOGASTRODUODENOSCOPY (EGD) WITH PROPOFOL ;  Surgeon: Sergio Dandy, MD;  Location: [facility] ENDOSCOPY;  Service: Gastroenterology;  Laterality: N/A;   INSERTION OF DIALYSIS CATHETER Right 08/01/2023   Procedure: INSERTION OF TUNNELED DIALYSIS CATHETER;  Surgeon: Philipp Brawn, MD;  Location: [facility] OR;  Service: Vascular;  Laterality: Right;   IR FLUORO GUIDE CV LINE RIGHT  01/26/2022   IR US  GUIDE VASC ACCESS RIGHT  01/26/2022   LEFT HEART CATH AND CORONARY ANGIOGRAPHY N/A 12/20/2021   Procedure: LEFT HEART CATH AND CORONARY ANGIOGRAPHY;  Surgeon: Odie Benne, MD;  Location: [facility] INVASIVE CV LAB;  Service: Cardiovascular;  Laterality: N/A;   OPEN REDUCTION INTERNAL FIXATION (ORIF) FINGER WITH RADIAL BONE  GRAFT Right 09/12/2015   Procedure: RIGHT MIDDLE FINGER CLOSED REDUCTION AND PINNING;  Surgeon: Arvil Birks, MD;  Location: [facility] OR;  Service: Orthopedics;  Laterality: Right;   TONSILLECTOMY     TUBAL LIGATION      Social History   Socioeconomic History   Marital status: Single    Spouse name: Not on file   Number of children: 3   Years of education: Not on file   Highest education level: Not on file  Occupational History   Not on file  Tobacco Use   Smoking status: Some Days    Current packs/day: 0.25    Average packs/day: 0.3 packs/day for 3.0  years (0.8 ttl pk-yrs)    Types: Cigarettes    Passive exposure: Current   Smokeless tobacco: Never  Vaping Use   Vaping status: Never Used  Substance and Sexual Activity   Alcohol use: Yes    Alcohol/week: 0.0 standard drinks of alcohol    Comment: occasional   Drug use: Yes    Types: Cocaine    Sexual activity: Not Currently  Other Topics Concern   Not on file  Social History Narrative   Not on file   Social Drivers of Health   Financial Resource Strain: Low Risk  (04/04/2022)   Overall Financial Resource Strain (CARDIA)    Difficulty of Paying Living Expenses: Not very hard  Food Insecurity: No Food Insecurity (01/03/2024)   Hunger Vital Sign    Worried About Running Out of Food in the Last Year: Never true    Ran Out of Food in the Last Year: Never true  Transportation Needs: No Transportation Needs (01/03/2024)   PRAPARE - Administrator, Civil Service (Medical): No    Lack of Transportation (Non-Medical): No  Physical Activity: Not on file  Stress: Not on file  Social Connections: Not on file  Intimate Partner Violence: Not At Risk (01/03/2024)   Humiliation, Afraid, Rape, and Kick questionnaire    Fear of Current or Ex-Partner: No    Emotionally Abused: No    Physically Abused: No    Sexually Abused: No    Family History  Problem Relation Age of Onset   Diabetes Mother    Kidney disease Mother    Seizures Father    Hypertension Father    Stroke Father    Asthma Other    Allergies  Allergen Reactions   Penicillins Shortness Of Breath and Other (See Comments)    Caused yeast infection Has patient had a PCN reaction causing immediate rash, facial/tongue/throat swelling, SOB or lightheadedness with hypotension: Yes Has patient had a PCN reaction causing severe rash involving mucus membranes or skin necrosis: No Has patient had a PCN reaction that required hospitalization pt was in the hospital at the time of the reaction Has patient had a PCN reaction  occurring within the last 10 years: No If all of the above answers are "NO", then may proceed with Cephalosp   Ultram  [Tramadol ] Other (See Comments)    Made her tongue raw   I? Current Facility-Administered Medications  Medication Dose Route Frequency Provider Last Rate Last Admin   acetaminophen  (TYLENOL ) tablet 650 mg  650 mg Oral Q6H PRN Willadean Hark, MD   650 mg at 01/04/24 0028   Or   acetaminophen  (TYLENOL ) suppository 650 mg  650 mg Rectal Q6H PRN Willadean Hark, MD       amLODipine  (NORVASC ) tablet 10 mg  10 mg Oral Daily Willadean Hark, MD  10 mg at 01/04/24 1024   aspirin  EC tablet 81 mg  81 mg Oral Daily Willadean Hark, MD   81 mg at 01/04/24 1024   atorvastatin  (LIPITOR ) tablet 80 mg  80 mg Oral Daily Claiborne, Claudia, MD   80 mg at 01/04/24 1024   bisacodyl  (DULCOLAX) EC tablet 5 mg  5 mg Oral Daily PRN Willadean Hark, MD       cefTRIAXone  (ROCEPHIN ) 2 g in sodium chloride  0.9 % 100 mL IVPB  2 g Intravenous Q24H Willadean Hark, MD   Stopped at 01/04/24 1107   [START ON 01/05/2024] Chlorhexidine  Gluconate Cloth 2 % PADS 6 each  6 each Topical Q0600 Chucky Craver, MD       gabapentin  (NEURONTIN ) capsule 100 mg  100 mg Oral BID Willadean Hark, MD   100 mg at 01/04/24 1023   heparin  injection 5,000 Units  5,000 Units Subcutaneous Q8H Willadean Hark, MD   5,000 Units at 01/04/24 1645   ipratropium-albuterol  (DUONEB) 0.5-2.5 (3) MG/3ML nebulizer solution 3 mL  3 mL Nebulization Q2H PRN Willadean Hark, MD       micafungin (MYCAMINE) 100 mg in sodium chloride  0.9 % 100 mL IVPB  100 mg Intravenous Q24H Paytes, Austin A, RPH       ondansetron  (ZOFRAN ) injection 4 mg  4 mg Intravenous Q6H PRN Dorisann Garre F, PA-C   4 mg at 01/03/24 2154   oxyCODONE  (Oxy IR/ROXICODONE ) immediate release tablet 5 mg  5 mg Oral Q6H PRN Willadean Hark, MD   5 mg at 01/04/24 1645   pantoprazole  (PROTONIX ) EC tablet 40 mg  40 mg Oral QHS Willadean Hark, MD          Abtx:  Anti-infectives (From admission, onward)    Start     Dose/Rate Route Frequency Ordered Stop   01/04/24 2030  micafungin (MYCAMINE) 100 mg in sodium chloride  0.9 % 100 mL IVPB        100 mg 105 mL/hr over 1 Hours Intravenous Every 24 hours 01/04/24 1940     01/04/24 1000  cefTRIAXone  (ROCEPHIN ) 2 g in sodium chloride  0.9 % 100 mL IVPB        2 g 200 mL/hr over 30 Minutes Intravenous Every 24 hours 01/03/24 2055     01/03/24 1615  cefTRIAXone  (ROCEPHIN ) 1 g in sodium chloride  0.9 % 100 mL IVPB        1 g 200 mL/hr over 30 Minutes Intravenous  Once 01/03/24 1601 01/03/24 1759   01/03/24 1615  azithromycin  (ZITHROMAX ) 500 mg in sodium chloride  0.9 % 250 mL IVPB        500 mg 250 mL/hr over 60 Minutes Intravenous  Once 01/03/24 1601 01/03/24 1915       REVIEW OF SYSTEMS:  Const:  fever,  chills, negative weight loss Eyes: negative diplopia or visual changes, negative eye pain ENT: negative coryza, negative sore throat Resp:  cough,  dyspnea Cards: negative for chest pain, palpitations,has  lower extremity edema GU: negative for frequency, dysuria and hematuria GI: Negative for abdominal pain, has diarrhea,no  bleeding, constipation Skin: negative for rash and pruritus Heme: negative for easy bruising and gum/nose bleeding MS:  myalgias, , back pain and  weakness Neurolo:negative for headaches, +dizziness, vertigo, memory problems  Psych: negative for feelings of anxiety, depression  Endocrine:  diabetes Allergy/Immunology-PCN- hives Says she has taken amoxicillin without any problem  VITALS:  BP 119/64 (BP Location: Right Arm)   Pulse 67   Temp 98.8 F (  37.1 C)   Resp 17   Ht 5\' 4"  (1.626 m)   Wt 66.7 kg   SpO2 100%   BMI 25.23 kg/m  LDA Rt internal jugular HD cath PHYSICAL EXAM:  General: Alert, cooperative, no distress, appears stated age.  Head: Normocephalic, without obvious abnormality, atraumatic. Eyes: Conjunctivae clear, anicteric sclerae.  Pupils are equal ENT Nares normal. No drainage or sinus tenderness. Lips, mucosa, and tongue normal. No Thrush Missing teeth Some tenderness over the root area on the top  Neck: Supple, symmetrical, no adenopathy, thyroid: non tender no carotid bruit and no JVD. Back: No CVA tenderness. Lumbar3/4 tenderness on tapping Lungs: b/l air entry Crepts bases Heart: s1s2, 2/6 systolic murmur Abdomen: Soft, non-tender,not distended. Bowel sounds normal. No masses Extremities: atraumatic, no cyanosis. minimal edema. No clubbing Skin: No rashes or lesions. Or bruising Lymph: Cervical, supraclavicular normal. Neurologic: Grossly non-focal Pertinent Labs Lab Results CBC    Component Value Date/Time   WBC 15.0 (H) 01/04/2024 0702   RBC 3.03 (L) 01/04/2024 0702   HGB 9.0 (L) 01/04/2024 0702   HCT 27.9 (L) 01/04/2024 0702   PLT 110 (L) 01/04/2024 0702   MCV 92.1 01/04/2024 0702   MCH 29.7 01/04/2024 0702   MCHC 32.3 01/04/2024 0702   RDW 18.2 (H) 01/04/2024 0702   LYMPHSABS 0.2 (L) 01/03/2024 1407   MONOABS 0.0 (L) 01/03/2024 1407   EOSABS 0.1 01/03/2024 1407   BASOSABS 0.0 01/03/2024 1407       Latest Ref Rng & Units 01/04/2024    5:29 AM 01/03/2024    2:07 PM 12/10/2023    9:25 AM  CMP  Glucose 70 - 99 mg/dL 93  79  660   BUN 8 - 23 mg/dL 26  17  14    Creatinine 0.44 - 1.00 mg/dL 6.30  1.60  1.09   Sodium 135 - 145 mmol/L 136  138  139   Potassium 3.5 - 5.1 mmol/L 4.4  3.7  4.2   Chloride 98 - 111 mmol/L 101  104  104   CO2 22 - 32 mmol/L 23  20  24    Calcium  8.9 - 10.3 mg/dL 8.1  8.8  8.4   Total Protein 6.5 - 8.1 g/dL 6.3     Total Bilirubin 0.0 - 1.2 mg/dL 1.0     Alkaline Phos 38 - 126 U/L 124     AST 15 - 41 U/L 112     ALT 0 - 44 U/L 44         Microbiology: Recent Results (from the past 240 hours)  Resp panel by RT-PCR (RSV, Flu A&B, Covid) Anterior Nasal Swab     Status: None   Collection Time: 01/03/24  1:33 PM   Specimen: Anterior Nasal Swab  Result Value  Ref Range Status   SARS Coronavirus 2 by RT PCR NEGATIVE NEGATIVE Final   Influenza A by PCR NEGATIVE NEGATIVE Final   Influenza B by PCR NEGATIVE NEGATIVE Final    Comment: (NOTE) The Xpert Xpress SARS-CoV-2/FLU/RSV plus assay is intended as an aid in the diagnosis of influenza from Nasopharyngeal swab specimens and should not be used as a sole basis for treatment. Nasal washings and aspirates are unacceptable for Xpert Xpress SARS-CoV-2/FLU/RSV testing.  Fact Sheet for Patients: BloggerCourse.com  Fact Sheet for Healthcare Providers: SeriousBroker.it  This test is not yet approved or cleared by the [place]  FDA and has been authorized for detection and/or diagnosis of SARS-CoV-2 by FDA under an Emergency Use  Authorization (EUA). This EUA will remain in effect (meaning this test can be used) for the duration of the COVID-19 declaration under Section 564(b)(1) of the Act, 21 U.S.C. section 360bbb-3(b)(1), unless the authorization is terminated or revoked.     Resp Syncytial Virus by PCR NEGATIVE NEGATIVE Final    Comment: (NOTE) Fact Sheet for Patients: BloggerCourse.com  Fact Sheet for Healthcare Providers: SeriousBroker.it  This test is not yet approved or cleared by the [place]  FDA and has been authorized for detection and/or diagnosis of SARS-CoV-2 by FDA under an Emergency Use Authorization (EUA). This EUA will remain in effect (meaning this test can be used) for the duration of the COVID-19 declaration under Section 564(b)(1) of the Act, 21 U.S.C. section 360bbb-3(b)(1), unless the authorization is terminated or revoked.  Performed at [facility], [address] [place], [place] [address]   Blood culture (routine x 2)     Status: None (Preliminary result)   Collection Time: 01/03/24  2:59 PM   Specimen: BLOOD RIGHT FOREARM  Result Value Ref Range  Status   Specimen Description BLOOD RIGHT FOREARM  Final   Special Requests   Final    BOTTLES DRAWN AEROBIC AND ANAEROBIC Blood Culture adequate volume   Culture  Setup Time   Final    YEAST IN BOTH AEROBIC AND ANAEROBIC BOTTLES CRITICAL RESULT CALLED TO, READ BACK BY AND VERIFIED WITH: PHARMD AUSTIN PAYTES [id number] 1938 BY Chestine Costain, [place] Performed at [facility], [address], [place], [place] [address]    Culture YEAST  Final   Report Status PENDING  Incomplete  Blood Culture ID Panel (Reflexed)     Status: Abnormal   Collection Time: 01/03/24  2:59 PM  Result Value Ref Range Status   Enterococcus faecalis NOT DETECTED NOT DETECTED Final   Enterococcus Faecium NOT DETECTED NOT DETECTED Final   Listeria monocytogenes NOT DETECTED NOT DETECTED Final   Staphylococcus species NOT DETECTED NOT DETECTED Final   Staphylococcus aureus (BCID) NOT DETECTED NOT DETECTED Final   Staphylococcus epidermidis NOT DETECTED NOT DETECTED Final   Staphylococcus lugdunensis NOT DETECTED NOT DETECTED Final   Streptococcus species NOT DETECTED NOT DETECTED Final   Streptococcus agalactiae NOT DETECTED NOT DETECTED Final   Streptococcus pneumoniae NOT DETECTED NOT DETECTED Final   Streptococcus pyogenes NOT DETECTED NOT DETECTED Final   A.calcoaceticus-baumannii NOT DETECTED NOT DETECTED Final   Bacteroides fragilis NOT DETECTED NOT DETECTED Final   Enterobacterales NOT DETECTED NOT DETECTED Final   Enterobacter cloacae complex NOT DETECTED NOT DETECTED Final   Escherichia coli NOT DETECTED NOT DETECTED Final   Klebsiella aerogenes NOT DETECTED NOT DETECTED Final   Klebsiella oxytoca NOT DETECTED NOT DETECTED Final   Klebsiella pneumoniae NOT DETECTED NOT DETECTED Final   Proteus species NOT DETECTED NOT DETECTED Final   Salmonella species NOT DETECTED NOT DETECTED Final   Serratia marcescens NOT DETECTED NOT DETECTED Final   Haemophilus influenzae NOT DETECTED NOT DETECTED Final   Neisseria  meningitidis NOT DETECTED NOT DETECTED Final   Pseudomonas aeruginosa NOT DETECTED NOT DETECTED Final   Stenotrophomonas maltophilia NOT DETECTED NOT DETECTED Final   Candida albicans NOT DETECTED NOT DETECTED Final   Candida auris NOT DETECTED NOT DETECTED Final   Candida glabrata NOT DETECTED NOT DETECTED Final   Candida krusei NOT DETECTED NOT DETECTED Final   Candida parapsilosis NOT DETECTED NOT DETECTED Final   Candida tropicalis DETECTED (A) NOT DETECTED Final    Comment: CRITICAL RESULT CALLED  TO, READ BACK BY AND VERIFIED WITH: PHARMD AUSTIN PAYTES [id number] 1938 BY J RAZZAK, MT    Cryptococcus neoformans/gattii NOT DETECTED NOT DETECTED Final    Comment: Performed at [facility], [address] [place], [place] [address]  Blood culture (routine x 2)     Status: None (Preliminary result)   Collection Time: 01/04/24  5:39 AM   Specimen: BLOOD RIGHT ARM  Result Value Ref Range Status   Specimen Description BLOOD RIGHT ARM  Final   Special Requests   Final    BOTTLES DRAWN AEROBIC AND ANAEROBIC Blood Culture adequate volume Performed at [facility], [address], [place], [place] [address]    Culture PENDING  Incomplete   Report Status PENDING  Incomplete    IMAGING RESULTS:  I have personally reviewed the films ?cardiomegaly Bibasilar atelectasis  Impression/Recommendation ?27 female with ESRD on HD thru cath, presents with fever, chills N/V  Candidemia-this is likely due to HD catheter HD cath has to be removed ( please send tip for culture) We need to r/o endocarditis Need 2 d echo May need TEE Need to get opthal examination  Agree with micafungin  Fluconazole can be an option later will repeat blood culture 48-72 hrs later Do not put new HD cath until fungemia is cleared Pt has AVF-   Can stop ceftriaxone   Leucopenia on admission due to infection - today leucocytosis  Gall stones on CT with questionable pericholecystic fluid- awaiting  US  Diverticular disease on CT   Lumbar back pain- MRI no evidence of discits  DM-  Diarrhea-for more than 2 weeks  will check Cdiff/GI panel _ ? ? I have personally spent  -80--minutes involved in face-to-face and non-face-to-face activities for this patient on the day of the visit. Professional time spent includes the following activities: Preparing to see the patient (review of tests), Obtaining and/or reviewing separately obtained history (admission/discharge record), Performing a medically appropriate examination and/or evaluation , Ordering medications/tests/procedures, referring and communicating with other health care professionals, Documenting clinical information in the EMR, Independently interpreting results (not separately reported), Communicating results to the patient/daughter, Counseling and educating the patient/fdaughter and Care coordination   ________________________________________________

## 2024-01-04 NOTE — Progress Notes (Signed)
 Progress Note   Patient: Carol [name] Carol [name]:[id number] DOB: 1960/09/23 DOA: 01/03/2024     1 DOS: the patient was seen and examined on 01/04/2024   Brief hospital course: Carol [name] is a 63 y.o. female with medical history significant for ESRD HD MWF reports that she has been having subjective fevers and shaking chills for the last 2 to 3 days at home.  C/o mild shortness of breath no cough, diarrhea today at dialysis she says she just got much worse all of a sudden.  Found to have fever in ED, leukopenia, started on Rocephin  and Zithromax  admitted for fever and early sepsis.   Assessment and Plan: Sepsis unknown origin- Patient had fever, leukopenia, tachycardia, tachypnea. WBC count improved to 15,000. Continue empiric Rocephin  and azithromycin  for possible pneumonia. Continue to follow vitals closely, if fever persists, blood cultures positive may need to remove her indwelling HD catheter. Will check respiratory viral panel 20 pathogens. Follow blood cultures.  ESRD on hemodialysis: Nephrology evaluation appreciated. She is on Monday Wednesday Friday schedule. BNP elevated, CT chest concerning for fluid overload. Nephrology plan to do dialysis tonight.  Hypertension: Blood pressure elevated initially.  Today BP well-controlled. Continue Norvasc  therapy.  Anemia of chronic disease: Hemoglobin stable around 9. No active bleeding.  Abnormal thyroid nodule found on CT scan: Thyroid ultrasound pending. TSH, free T4 normal.  Gallstones with gallbladder wall thickening versus trace pericholecystic fluid seen on CT scan. RUQ sono ordered. LFTs added.    Out of bed to chair. Incentive spirometry. Nursing supportive care. Fall, aspiration precautions. Diet:  Diet Orders (From admission, onward)     Start     Ordered   01/03/24 2026  Diet renal with fluid restriction Fluid restriction: 1200 mL Fluid; Room service appropriate? Yes; Fluid consistency: Thin   Diet effective now       Question Answer Comment  Fluid restriction: 1200 mL Fluid   Room service appropriate? Yes   Fluid consistency: Thin      01/03/24 2025           DVT prophylaxis: heparin  injection 5,000 Units Start: 01/03/24 2200  Level of care: Telemetry Medical   Code Status: Full Code  Subjective: Patient is seen and examined today morning. She has cough. Denies abdominal pain, nausea. No chest pain.  Physical Exam: Vitals:   01/04/24 0130 01/04/24 0440 01/04/24 0939 01/04/24 1213  BP:  124/69 122/64 137/60  Pulse:  67 68 77  Resp:  17 17 18   Temp: 98.8 F (37.1 C) 97.7 F (36.5 C) 98.6 F (37 C) 98.9 F (37.2 C)  TempSrc:  Oral    SpO2:  100% 100% 100%  Weight:      Height:        General - Elderly African American female, no apparent distress HEENT - PERRLA, EOMI, atraumatic head, non tender sinuses. Lung - Clear, basal rhonchi, no wheezes. Heart - S1, S2 heard, no murmurs, rubs, trace pedal edema. Abdomen - Soft, non tender, bowel sounds good Neuro - Alert, awake and oriented x 3, non focal exam. Skin - Warm and dry.  Data Reviewed:      Latest Ref Rng & Units 01/04/2024    7:02 AM 01/03/2024    2:07 PM 12/10/2023    9:25 AM  CBC  WBC 4.0 - 10.5 K/uL 15.0  1.2    1.3  6.5   Hemoglobin 12.0 - 15.0 g/dL 9.0  95.6    21.3  08.6   Hematocrit  36.0 - 46.0 % 27.9  32.9    32.5  32.5   Platelets 150 - 400 K/uL 110  154    162  170       Latest Ref Rng & Units 01/04/2024    5:29 AM 01/03/2024    2:07 PM 12/10/2023    9:25 AM  BMP  Glucose 70 - 99 mg/dL 93  79  604   BUN 8 - 23 mg/dL 26  17  14    Creatinine 0.44 - 1.00 mg/dL 5.40  9.81  1.91   Sodium 135 - 145 mmol/L 136  138  139   Potassium 3.5 - 5.1 mmol/L 4.4  3.7  4.2   Chloride 98 - 111 mmol/L 101  104  104   CO2 22 - 32 mmol/L 23  20  24    Calcium  8.9 - 10.3 mg/dL 8.1  8.8  8.4    MR LUMBAR SPINE WO CONTRAST Result Date: 01/04/2024 CLINICAL DATA:  63 year old female with end-stage  renal disease. Fevers and chills, diarrhea, possible sepsis. Back pain. EXAM: MRI LUMBAR SPINE WITHOUT CONTRAST TECHNIQUE: Multiplanar, multisequence MR imaging of the lumbar spine was performed. No intravenous contrast was administered. COMPARISON:  CT Abdomen and Pelvis yesterday. Lumbar MRI 02/01/2014. FINDINGS: Segmentation:  Normal on the CT yesterday. Alignment:  Stable lumbar lordosis. Vertebrae: L2 inferior endplate Schmorl's node, new since 2015 but well documented on the CT yesterday with adjacent L2-L3 vacuum disc. Maintained lumbar vertebral height. 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 No suspicious marrow edema identified.  Visible sacrum intact. Conus medullaris and cauda equina: Conus extends to the T12-L1 level. No lower spinal cord or conus signal abnormality. Capacious spinal canal at most levels. Negative cauda equina nerve roots. Paraspinal and other soft tissues: Stable to the CT Abdomen and Pelvis yesterday, including posterior subcutaneous edema. No suspicious paraspinal inflammation. Disc levels: Capacious spinal canal at most levels. Age-appropriate lumbar spine degeneration at most levels. Conspicuous disc degeneration at L2-L3 (with trace vacuum disc yesterday and associated L2 Schmorl's node), L5-S1. No lumbar spinal stenosis. IMPRESSION: 1. No evidence of Lumbar discitis osteomyelitis. An L2 inferior endplate Schmorl's node is noted and was well demonstrated by CT yesterday. 2. Mild for age lumbar spine degeneration and capacious underlying spinal canal, no lumbar spinal stenosis. 3. Hemosiderosis. Electronically Signed   By: Marlise Simpers M.D.   On: 01/04/2024 04:38   CT ABDOMEN PELVIS WO CONTRAST Result Date: 01/03/2024 CLINICAL DATA:  Fever EXAM: CT ABDOMEN AND PELVIS WITHOUT CONTRAST TECHNIQUE: Multidetector CT imaging of the abdomen and pelvis was performed following  the standard protocol without IV contrast. RADIATION DOSE REDUCTION: This exam was performed according to the departmental dose-optimization program which includes automated exposure control, adjustment of the mA and/or kV according to patient size and/or use of iterative reconstruction technique. COMPARISON:  CT 01/15/2020 FINDINGS: Lower chest: See separately dictated chest CT Hepatobiliary: Gallstones. Gallbladder wall thickening versus trace pericholecystic fluid. No biliary dilatation. Possible surface nodularity of the liver Pancreas: Unremarkable. No pancreatic ductal dilatation or surrounding inflammatory changes. Spleen: Normal in size without focal abnormality. Adrenals/Urinary Tract: Stable adrenal glands. No hydronephrosis. Nonspecific left greater than right perinephric stranding. The bladder is slightly thick walled. Stomach/Bowel: The stomach is nonenlarged. Scattered fluid-filled small bowel without definitive obstruction. No acute bowel wall thickening. Diverticular disease of the colon Vascular/Lymphatic: Aortic atherosclerosis without aneurysm. Borderline Peri  aortic lymph nodes measuring up to 11 mm. Reproductive: Possible uterine fibroids.  No suspicious adnexal mass Other: Negative for pelvic effusion or free air Musculoskeletal: No acute or suspicious osseous abnormality IMPRESSION: 1. Gallstones with gallbladder wall thickening versus trace pericholecystic fluid. Follow-up right upper quadrant ultrasound as indicated. 2. Diverticular disease of the colon without acute bowel wall thickening. 3. Nonspecific left greater than right perinephric stranding. Slight bladder wall thickening, correlate with urinalysis to exclude cystitis and ascending urinary tract infection. 4. Possible surface nodularity/liver cirrhosis 5. Aortic atherosclerosis. Aortic Atherosclerosis (ICD10-I70.0). Electronically Signed   By: Esmeralda Hedge M.D.   On: 01/03/2024 22:44   CT CHEST WO CONTRAST Result Date:  01/03/2024 CLINICAL DATA:  Chest pain.  Respiratory illness. EXAM: CT CHEST WITHOUT CONTRAST TECHNIQUE: Multidetector CT imaging of the chest was performed following the standard protocol without IV contrast. RADIATION DOSE REDUCTION: This exam was performed according to the departmental dose-optimization program which includes automated exposure control, adjustment of the mA and/or kV according to patient size and/or use of iterative reconstruction technique. COMPARISON:  January 22, 2022. FINDINGS: Cardiovascular: Mild cardiomegaly. No pericardial effusion. Right internal jugular dialysis catheter is noted with distal tip at cavoatrial junction. Atherosclerosis of thoracic aorta without aneurysm formation. Mediastinum/Nodes: 3.4 cm left thyroid nodule is noted. Esophagus is unremarkable. No adenopathy. Lungs/Pleura: No pneumothorax or pleural effusion is noted. Mild peripheral septal thickening is noted bilaterally, right greater than left, concerning for possible edema or atypical inflammation. Upper Abdomen: No acute abnormality. Musculoskeletal: Probable subacute fractures are seen involving the posterior portions of the left sixth, seventh and eighth ribs. Probable subacute right seventh rib fracture is noted as well. IMPRESSION: Mild peripheral septal thickening is noted in both lungs, right greater than left, concerning for possible edema or atypical inflammation. 3.4 cm left thyroid nodule is noted. Recommend thyroid US . (Ref: J Am Coll Radiol. 2015 Feb;12(2): 143-50). Multiple bilateral subacute rib fractures are noted. Aortic Atherosclerosis (ICD10-I70.0). Electronically Signed   By: Rosalene Colon M.D.   On: 01/03/2024 15:36   DG Chest Portable 1 View Result Date: 01/03/2024 CLINICAL DATA:  Chest pain EXAM: PORTABLE CHEST 1 VIEW COMPARISON:  Chest radiograph dated 12/10/2023 FINDINGS: Lines/tubes: Right internal jugular venous catheter tip projects over the superior cavoatrial junction. Lungs: Patient is  rotated to the right. Bibasilar patchy opacities. Similar chronic-appearing perihilar pulmonary vasculature. Pleura: No pneumothorax or pleural effusion. Heart/mediastinum: Similar enlarged cardiomediastinal silhouette. Bones: No acute osseous abnormality. IMPRESSION: 1. Bibasilar patchy opacities, which may represent atelectasis, aspiration, or pneumonia. 2. Similar cardiomegaly. Electronically Signed   By: Limin  Xu M.D.   On: 01/03/2024 14:18    Family Communication: Discussed with patient, understand and agree. All questions answered.  Disposition: Status is: Inpatient Remains inpatient appropriate because: sepsis, antibiotic need, follow blood cultures  Planned Discharge Destination: Home with Home Health     Time spent: 41 minutes  Author: Aisha Hove, MD 01/04/2024 4:06 PM Secure chat 7am to 7pm For on call review [email or web address].

## 2024-01-04 NOTE — Consult Note (Addendum)
 Renal Service Consult Note [facility]  Carol [name] 01/04/2024 Carol Sandifer, MD Requesting Physician: Dr. Butch Cashing  Reason for Consult: ESRD pt w/ chest pain and SOB HPI: The patient is a 63 y.o. year-old w/ PMH as below who presented to ED c/o SOB and chest pain while on HD yesterday 5/16. In ED temp was 102 deg, bp's were high and HR was high.  WBC was low in 1- 2range. LA was wnl. IV abx were started. CT chest showed mild vol overload. Pt was admitted, we are asked to see for dialysis.    Pt seen in room. Goes to HD at [facility] MWF shcedule. Started in feb 2025. Denies any SOB or leg swelling.    ROS - denies CP, no joint pain, no HA, no blurry vision, no rash, no diarrhea, no nausea/ vomiting  PMH: Anxiety Chronic active hepatitis  Chronic back pain Depression Diabetes type 2 ESRD on dialysis  GERD HTN CAD / hx of NSTEMI Parkinson's disease  Past Surgical History  Past Surgical History:  Procedure Laterality Date   AV FISTULA PLACEMENT Left 08/01/2023   Procedure: LEFT ARM BRACHIOBASILIC FISTULA CREATION;  Surgeon: Carol Brawn, MD;  Location[facility] OR;  Service: Vascular;  Laterality: Left;   BALLOON DILATION N/A 11/06/2022   Procedure: BALLOON DILATION;  Surgeon: Carol Dandy, MD;  Location: [facility] ENDOSCOPY;  Service: Gastroenterology;  Laterality: N/A;   CARPAL TUNNEL RELEASE     COLONOSCOPY WITH PROPOFOL  N/A 11/06/2022   Procedure: COLONOSCOPY WITH PROPOFOL ;  Surgeon: Carol Dandy, MD;  Location: [facility] ENDOSCOPY;  Service: Gastroenterology;  Laterality: N/A;   ESOPHAGOGASTRODUODENOSCOPY (EGD) WITH PROPOFOL  N/A 11/06/2022   Procedure: ESOPHAGOGASTRODUODENOSCOPY (EGD) WITH PROPOFOL ;  Surgeon: Carol Dandy, MD;  Location: [facility] ENDOSCOPY;  Service: Gastroenterology;  Laterality: N/A;   INSERTION OF DIALYSIS CATHETER Right 08/01/2023   Procedure: INSERTION OF TUNNELED DIALYSIS CATHETER;  Surgeon: Carol Brawn, MD;  Location: [facility] OR;   Service: Vascular;  Laterality: Right;   IR FLUORO GUIDE CV LINE RIGHT  01/26/2022   IR US  GUIDE VASC ACCESS RIGHT  01/26/2022   LEFT HEART CATH AND CORONARY ANGIOGRAPHY N/A 12/20/2021   Procedure: LEFT HEART CATH AND CORONARY ANGIOGRAPHY;  Surgeon: Carol Benne, MD;  Location: [facility] INVASIVE CV LAB;  Service: Cardiovascular;  Laterality: N/A;   OPEN REDUCTION INTERNAL FIXATION (ORIF) FINGER WITH RADIAL BONE GRAFT Right 09/12/2015   Procedure: RIGHT MIDDLE FINGER CLOSED REDUCTION AND PINNING;  Surgeon: Carol Birks, MD;  Location: [facility] OR;  Service: Orthopedics;  Laterality: Right;   TONSILLECTOMY     TUBAL LIGATION     Family History  Family History  Problem Relation Age of Onset   Diabetes Mother    Kidney disease Mother    Seizures Father    Hypertension Father    Stroke Father    Asthma Other    Social History  reports that she has been smoking cigarettes. She has a 0.8 pack-year smoking history. She has been exposed to tobacco smoke. She has never used smokeless tobacco. She reports current alcohol use. She reports current drug use. Drug: Cocaine . Allergies  Allergies  Allergen Reactions   Penicillins Shortness Of Breath and Other (See Comments)    Caused yeast infection Has patient had a PCN reaction causing immediate rash, facial/tongue/throat swelling, SOB or lightheadedness with hypotension: Yes Has patient had a PCN reaction causing severe rash involving mucus membranes or skin necrosis: No Has patient had a PCN reaction that  required hospitalization pt was in the hospital at the time of the reaction Has patient had a PCN reaction occurring within the last 10 years: No If all of the above answers are "NO", then may proceed with Cephalosp   Ultram  [Tramadol ] Other (See Comments)    Made her tongue raw   Home medications Prior to Admission medications   Medication Sig Start Date End Date Taking? Authorizing Provider  albuterol  (PROVENTIL  HFA;VENTOLIN  HFA) 108 (90 Base)  MCG/ACT inhaler Inhale 2 puffs into the lungs every 4 (four) hours as needed for wheezing or shortness of breath.   Yes [provider]  albuterol  (VENTOLIN  HFA) 108 (90 Base) MCG/ACT inhaler Inhale 1-2 puffs into the lungs every 6 (six) hours as needed for wheezing or shortness of breath. 08/25/23  Yes Russella Courts A, DO  amLODipine  (NORVASC ) 10 MG tablet Take 1 tablet (10 mg total) by mouth daily. 11/01/22  Yes Milford, Arlice Bene, FNP  aspirin  EC 81 MG tablet Take 1 tablet (81 mg total) by mouth daily. Swallow whole. 05/11/22  Yes Carol Liberty, MD  atorvastatin  (LIPITOR ) 80 MG tablet Take 1 tablet (80 mg total) by mouth daily. 03/11/23  Yes Carol Lipa, MD  budesonide -formoterol  [facility]) 160-4.5 MCG/ACT inhaler Inhale 2 puffs into the lungs in the morning and at bedtime. 11/09/21  Yes [provider]  cetirizine (ZYRTEC) 10 MG tablet Take 10 mg by mouth daily as needed for allergies.   Yes [provider]  Darbepoetin Alfa  (ARANESP ) 60 MCG/0.3ML SOSY injection Inject 0.3 mLs (60 mcg total) into the skin every Thursday at 6pm. 08/08/23  Yes Dezii, Alexandra, DO  eszopiclone 3 MG TABS Take 3 mg by mouth at bedtime. Take immediately before bedtime   Yes [provider]  gabapentin  (NEURONTIN ) 100 MG capsule Take 1 capsule (100 mg total) by mouth 2 (two) times daily. 08/14/23 01/03/24 Yes Dezii, Alexandra, DO  glucagon  1 MG injection Follow package directions for low blood sugar. 08/14/23 08/13/24 Yes Dezii, Alexandra, DO  guaiFENesin -dextromethorphan (ROBITUSSIN DM) 100-10 MG/5ML syrup Take 5 mLs by mouth every 4 (four) hours as needed for cough. 08/25/23  Yes Russella Courts A, DO  lidocaine  (LIDODERM ) 5 % Place 1 patch onto the skin daily. Remove & Discard patch within 12 hours or as directed by MD 12/10/23  Yes Dorenda Gandy, MD  naloxone  (NARCAN ) nasal spray 4 mg/0.1 mL If concern for opioid overdose, spray in nostril. Call 911. 08/14/23  Yes Dezii, Alexandra,  DO  naphazoline-pheniramine (NAPHCON-A) 0.025-0.3 % ophthalmic solution Place 2 drops into both eyes 2 (two) times daily.   Yes [provider]  nitroGLYCERIN  (NITROSTAT ) 0.4 MG SL tablet Place 1 tablet (0.4 mg total) under the tongue every 5 (five) minutes as needed for chest pain. 05/11/22 01/03/24 Yes Carol Liberty, MD  oxyCODONE -acetaminophen  (PERCOCET/ROXICET) 5-325 MG tablet Take 1 tablet by mouth every 8 (eight) hours as needed for severe pain (pain score 7-10). 12/10/23  Yes Dorenda Gandy, MD  pantoprazole  (PROTONIX ) 40 MG tablet Take 1 tablet (40 mg total) by mouth daily at 12 noon. Patient taking differently: Take 40 mg by mouth at bedtime. 05/11/22  Yes Carol Liberty, MD  promethazine -dextromethorphan (PROMETHAZINE -DM) 6.25-15 MG/5ML syrup Take 5 mLs by mouth 4 (four) times daily as needed for cough. 11/07/23  Yes Dalene Duck, MD  triamcinolone  ointment (KENALOG ) 0.1 % 1 Application 2 (two) times daily as needed. 06/29/23  Yes [provider]  doxycycline  (VIBRAMYCIN ) 100 MG capsule Take 1 capsule (100  mg total) by mouth 2 (two) times daily. One po bid x 7 days Patient not taking: Reported on 01/03/2024 11/07/23   Dalene Duck, MD     Vitals:   01/03/24 2322 01/04/24 0130 01/04/24 0440 01/04/24 0939  BP: 133/66  124/69 122/64  Pulse: 76  67 68  Resp: 18  17 17   Temp: (!) 100.8 F (38.2 C) 98.8 F (37.1 C) 97.7 F (36.5 C) 98.6 F (37 C)  TempSrc: Oral  Oral   SpO2: 99%  100% 100%  Weight:      Height:       Exam Gen alert, no distress No rash, cyanosis or gangrene Sclera anicteric, throat clear  No jvd or bruits Chest clear bilat to bases RRR no MRG Abd soft ntnd no mass or ascites +bs GU defer MS no joint effusions or deformity Ext no LE or UE edema, no other edema Neuro is alert, Ox 3 , nf   R internal jugular TDC/ LUA AVF maturing       Renal-related home meds: Norvasc  10 every day Others: PPI, percocet, sl ntg, neurontin ,  statin, asa, symbicort    [facility]  4h   B500   70.5kg    TDC    Heparin  2000 Has been coming off 2-4kg over last 2 wks Had 30 min Rx on 5/16     Assessment/ Plan: Fever / leukopenia: suspected sepsis. Having back pain, also indwelling HD catheter. On IV abx and bcx's pending. Per pmd.  ESRD: on HD MWF. Had full HD Wed, minimal dialysis on Friday. Plan HD today/ tonight for 3h HD.  HTN: bp's are good 140/ 80, cont norvasc  Volume: euvolemic on exam, UF goal 2-3 L as tol  Anemia of esrd: Hb 9- 11 here, follow.  Secondary hyperparathyroidism: CCa in range, add on phos.      Larry Poag  MD [facility] 01/04/2024, 11:09 AM  Recent Labs  Lab 01/03/24 1407 01/04/24 0529 01/04/24 0702  HGB 10.5*  10.3*  --  9.0*  ALBUMIN   --  2.5*  --   CALCIUM  8.8* 8.1*  --   CREATININE 4.92* 5.97*  --   K 3.7 4.4  --    Inpatient medications:  amLODipine   10 mg Oral Daily   aspirin  EC  81 mg Oral Daily   atorvastatin   80 mg Oral Daily   gabapentin   100 mg Oral BID   heparin   5,000 Units Subcutaneous Q8H   pantoprazole   40 mg Oral QHS    cefTRIAXone  (ROCEPHIN )  IV 2 g (01/04/24 1037)   acetaminophen  **OR** acetaminophen , bisacodyl , ipratropium-albuterol , ondansetron  (ZOFRAN ) IV, oxyCODONE 

## 2024-01-05 ENCOUNTER — Inpatient Hospital Stay (HOSPITAL_COMMUNITY)

## 2024-01-05 ENCOUNTER — Other Ambulatory Visit (HOSPITAL_COMMUNITY)

## 2024-01-05 DIAGNOSIS — B377 Candidal sepsis: Secondary | ICD-10-CM

## 2024-01-05 DIAGNOSIS — I38 Endocarditis, valve unspecified: Secondary | ICD-10-CM

## 2024-01-05 DIAGNOSIS — D72819 Decreased white blood cell count, unspecified: Secondary | ICD-10-CM | POA: Diagnosis not present

## 2024-01-05 DIAGNOSIS — K8020 Calculus of gallbladder without cholecystitis without obstruction: Secondary | ICD-10-CM | POA: Diagnosis not present

## 2024-01-05 DIAGNOSIS — K8000 Calculus of gallbladder with acute cholecystitis without obstruction: Secondary | ICD-10-CM

## 2024-01-05 DIAGNOSIS — E041 Nontoxic single thyroid nodule: Secondary | ICD-10-CM | POA: Diagnosis not present

## 2024-01-05 DIAGNOSIS — R935 Abnormal findings on diagnostic imaging of other abdominal regions, including retroperitoneum: Secondary | ICD-10-CM | POA: Diagnosis not present

## 2024-01-05 DIAGNOSIS — A419 Sepsis, unspecified organism: Secondary | ICD-10-CM | POA: Diagnosis not present

## 2024-01-05 DIAGNOSIS — N186 End stage renal disease: Secondary | ICD-10-CM | POA: Diagnosis not present

## 2024-01-05 LAB — RESPIRATORY PANEL BY PCR

## 2024-01-05 LAB — ECHOCARDIOGRAM COMPLETE
Area-P 1/2: 4.39 cm2
Height: 64 in
S' Lateral: 2.8 cm
Weight: 2352 [oz_av]

## 2024-01-05 LAB — COMPREHENSIVE METABOLIC PANEL WITH GFR
ALT: 29 U/L (ref 0–44)
AST: 38 U/L (ref 15–41)
Albumin: 2.8 g/dL — ABNORMAL LOW (ref 3.5–5.0)
Alkaline Phosphatase: 128 U/L — ABNORMAL HIGH (ref 38–126)
Anion gap: 14 (ref 5–15)
BUN: 41 mg/dL — ABNORMAL HIGH (ref 8–23)
CO2: 20 mmol/L — ABNORMAL LOW (ref 22–32)
Calcium: 8.5 mg/dL — ABNORMAL LOW (ref 8.9–10.3)
Chloride: 100 mmol/L (ref 98–111)
Creatinine, Ser: 7.51 mg/dL — ABNORMAL HIGH (ref 0.44–1.00)
GFR, Estimated: 6 mL/min — ABNORMAL LOW (ref >60)
Glucose, Bld: 91 mg/dL (ref 70–99)
Potassium: 5.3 mmol/L — ABNORMAL HIGH (ref 3.5–5.1)
Sodium: 134 mmol/L — ABNORMAL LOW (ref 135–145)
Total Bilirubin: 0.6 mg/dL (ref 0.0–1.2)
Total Protein: 6.9 g/dL (ref 6.5–8.1)

## 2024-01-05 MED ORDER — METRONIDAZOLE 500 MG/100ML IV SOLN
500.0000 mg | Freq: Two times a day (BID) | INTRAVENOUS | Status: DC
  Administered 2024-01-05 – 2024-01-07 (×4): 500 mg via INTRAVENOUS
  Filled 2024-01-05 (×4): qty 100

## 2024-01-05 MED ORDER — ALTEPLASE 2 MG IJ SOLR: 2.0000 mg | Freq: Once | INTRAMUSCULAR | Status: DC | PRN

## 2024-01-05 MED ORDER — ANTICOAGULANT SODIUM CITRATE 4% (200MG/5ML) IV SOLN: 5.0000 mL | Status: DC | PRN

## 2024-01-05 MED ORDER — LIDOCAINE-PRILOCAINE 2.5-2.5 % EX CREA: 1.0000 | TOPICAL_CREAM | CUTANEOUS | Status: DC | PRN

## 2024-01-05 MED ORDER — HEPARIN SODIUM (PORCINE) 1000 UNIT/ML DIALYSIS: 2000.0000 [IU] | Freq: Once | INTRAMUSCULAR | Status: DC

## 2024-01-05 MED ORDER — PERFLUTREN LIPID MICROSPHERE
1.0000 mL | INTRAVENOUS | Status: AC | PRN
  Administered 2024-01-05: 3 mL via INTRAVENOUS

## 2024-01-05 MED ORDER — PENTAFLUOROPROP-TETRAFLUOROETH EX AERO: 1.0000 | INHALATION_SPRAY | CUTANEOUS | Status: DC | PRN

## 2024-01-05 MED ORDER — HEPARIN SODIUM (PORCINE) 1000 UNIT/ML DIALYSIS: 1500.0000 [IU] | INTRAMUSCULAR | Status: DC | PRN

## 2024-01-05 MED ORDER — HEPARIN SODIUM (PORCINE) 1000 UNIT/ML DIALYSIS: 1000.0000 [IU] | INTRAMUSCULAR | Status: DC | PRN

## 2024-01-05 MED ORDER — HEPARIN SODIUM (PORCINE) 1000 UNIT/ML IJ SOLN
INTRAMUSCULAR | Status: AC
  Filled 2024-01-05: qty 5

## 2024-01-05 MED ORDER — LIDOCAINE HCL (PF) 1 % IJ SOLN: 5.0000 mL | INTRAMUSCULAR | Status: DC | PRN

## 2024-01-05 MED ORDER — NEPRO/CARBSTEADY PO LIQD: 237.0000 mL | ORAL | Status: DC | PRN

## 2024-01-05 NOTE — Progress Notes (Addendum)
 Date of Admission:  01/03/2024    ID: Carol [name] is a 63 y.o. female  Principal Problem:   Sepsis (HCC)    Subjective: She is feeling okay No fever No pain rt side of abdomen No loose stool since admission Ratehr she is constipated now  Medications:   amLODipine   10 mg Oral Daily   aspirin  EC  81 mg Oral Daily   atorvastatin   80 mg Oral Daily   Chlorhexidine  Gluconate Cloth  6 each Topical Q0600   gabapentin   100 mg Oral BID   heparin   5,000 Units Subcutaneous Q8H   pantoprazole   40 mg Oral QHS    Objective: Vital signs in last 24 hours: Patient Vitals for the past 24 hrs:  BP Temp Temp src Pulse Resp SpO2  01/05/24 1247 (!) 142/86 98 F (36.7 C) -- 70 17 100 %  01/05/24 0802 (!) 140/57 98 F (36.7 C) -- 66 18 100 %  01/05/24 0424 106/68 98 F (36.7 C) Oral 62 18 99 %  01/04/24 2357 120/69 98.4 F (36.9 C) Oral 67 18 100 %  01/04/24 2056 (!) 149/73 98.7 F (37.1 C) Oral 76 18 100 %  01/04/24 1743 119/64 98.8 F (37.1 C) -- 67 17 100 %     LDA HD cath  PHYSICAL EXAM:  General: Alert, cooperative, no distress, appears stated age.  Lungs: Clear to auscultation bilaterally. No Wheezing or Rhonchi. No rales. Heart: Regular rate and rhythm, no murmur, rub or gallop. Abdomen: Soft, non-tender,not distended. Bowel sounds normal. No masses Extremities: atraumatic, no cyanosis. No edema. No clubbing Skin: No rashes or lesions. Or bruising Lymph: Cervical, supraclavicular normal. Neurologic: Grossly non-focal  Lab Results    Latest Ref Rng & Units 01/04/2024    7:02 AM 01/03/2024    2:07 PM 12/10/2023    9:25 AM  CBC  WBC 4.0 - 10.5 K/uL 15.0  1.2    1.3  6.5   Hemoglobin 12.0 - 15.0 g/dL 9.0  16.1    09.6  04.5   Hematocrit 36.0 - 46.0 % 27.9  32.9    32.5  32.5   Platelets 150 - 400 K/uL 110  154    162  170        Latest Ref Rng & Units 01/04/2024    5:29 AM 01/03/2024    2:07 PM 12/10/2023    9:25 AM  CMP  Glucose 70 - 99 mg/dL 93  79   409   BUN 8 - 23 mg/dL 26  17  14    Creatinine 0.44 - 1.00 mg/dL 8.11  9.14  7.82   Sodium 135 - 145 mmol/L 136  138  139   Potassium 3.5 - 5.1 mmol/L 4.4  3.7  4.2   Chloride 98 - 111 mmol/L 101  104  104   CO2 22 - 32 mmol/L 23  20  24    Calcium  8.9 - 10.3 mg/dL 8.1  8.8  8.4   Total Protein 6.5 - 8.1 g/dL 6.3     Total Bilirubin 0.0 - 1.2 mg/dL 1.0     Alkaline Phos 38 - 126 U/L 124     AST 15 - 41 U/L 112     ALT 0 - 44 U/L 44         Microbiology: 5/16 BC- candida tropicalis Studies/Results: US  Abdomen Limited RUQ (LIVER/GB) Result Date: 01/04/2024 CLINICAL DATA:  Abnormal CT EXAM: ULTRASOUND ABDOMEN LIMITED RIGHT UPPER QUADRANT COMPARISON:  CT  abdomen and pelvis 01/03/2024 FINDINGS: Gallbladder: Gallstones are present measuring up to 1.6 cm. Pericholecystic fluid is present. The gallbladder wall thickened measuring 10 mm. Sonographic Abigail Abler sign is negative per sonographer. Common bile duct: Diameter: 5.5 mm. Liver: No focal lesion identified. There is lobulated liver contour. Within normal limits in parenchymal echogenicity. Portal vein is patent on color Doppler imaging with normal direction of blood flow towards the liver. Other: None. IMPRESSION: 1. Cholelithiasis with gallbladder wall thickening and pericholecystic fluid. Findings are concerning for acute cholecystitis. 2. Lobulated liver contour, which can be seen in the setting of cirrhosis. Electronically Signed   By: Tyron Gallon M.D.   On: 01/04/2024 22:28   US  THYROID Result Date: 01/04/2024 CLINICAL DATA:  Nodule on CT chest EXAM: THYROID ULTRASOUND TECHNIQUE: Ultrasound examination of the thyroid gland and adjacent soft tissues was performed. COMPARISON:  CT 01/03/2024 FINDINGS: Parenchymal Echotexture: Mildly heterogenous Isthmus: 0.8 cm thickness Right lobe: 4 x 1.2 x 2.1 cm Left lobe: _________________________________________________________ Estimated total number of nodules >/= 1 cm: 3 Number of spongiform nodules >/=   2 cm not described below (TR1): 0 Number of mixed cystic and solid nodules >/= 1.5 cm not described below (TR2): 0 _________________________________________________________ Nodule # 1: Location: Right; inferior Maximum size: 1.6 cm; Other 2 dimensions: 1.2 x 1.5 cm Composition: mixed cystic and solid (1) Echogenicity: isoechoic (1) Shape: not taller-than-wide (0) Margins: ill-defined (0) Echogenic foci: none (0) ACR TI-RADS total points: 2. ACR TI-RADS risk category: TR 2. ACR TI-RADS recommendations: This nodule does NOT meet TI-RADS criteria for biopsy or dedicated follow-up. _________________________________________________________ Nodule # 2: 0.9 cm hypoechoic nodule without calcifications, mid right; This nodule does NOT meet TI-RADS criteria for biopsy or dedicated follow-up. Nodule # 3: Location: Left; mid Maximum size: 3.6 cm; Other 2 dimensions: 2.7 x 2.9 cm Composition: mixed cystic and solid (1) Echogenicity: hyperechoic (1) Shape: taller-than-wide (3) Margins: ill-defined (0) Echogenic foci: none (0) ACR TI-RADS total points: 5. ACR TI-RADS risk category: TR 4. ACR TI-RADS recommendations: **Given size (>/= 1.5 cm) and appearance, fine needle aspiration of this moderately suspicious nodule should be considered based on TI-RADS criteria. _________________________________________________________ Nodule # 4: Location: Left; inferior Maximum size: 1.2 cm; Other 2 dimensions: 1 x 1.1 cm Composition: solid/almost completely solid (2) Echogenicity: isoechoic (1) Shape: not taller-than-wide (0) Margins: ill-defined (0) Echogenic foci: none (0) ACR TI-RADS total points: 3. ACR TI-RADS risk category: TR 3. ACR TI-RADS recommendations: Given size (<1.4 cm) and appearance, this nodule does NOT meet TI-RADS criteria for biopsy or dedicated follow-up. _________________________________________________________ No regional cervical adenopathy. IMPRESSION: 3.6 cm left mid thyroid nodule, meeting criteria for FNA.  Electronically Signed   By: Nicoletta Barrier M.D.   On: 01/04/2024 16:26   MR LUMBAR SPINE WO CONTRAST Result Date: 01/04/2024 CLINICAL DATA:  63 year old female with end-stage renal disease. Fevers and chills, diarrhea, possible sepsis. Back pain. EXAM: MRI LUMBAR SPINE WITHOUT CONTRAST TECHNIQUE: Multiplanar, multisequence MR imaging of the lumbar spine was performed. No intravenous contrast was administered. COMPARISON:  CT Abdomen and Pelvis yesterday. Lumbar MRI 02/01/2014. FINDINGS: Segmentation:  Normal on the CT yesterday. Alignment:  Stable lumbar lordosis. Vertebrae: L2 inferior endplate Schmorl's node, new since 2015 but well documented on the CT yesterday with adjacent L2-L3 vacuum disc. Maintained lumbar vertebral height. 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 No suspicious marrow edema identified.  Visible sacrum intact.  Conus medullaris and cauda equina: Conus extends to the T12-L1 level. No lower spinal cord or conus signal abnormality. Capacious spinal canal at most levels. Negative cauda equina nerve roots. Paraspinal and other soft tissues: Stable to the CT Abdomen and Pelvis yesterday, including posterior subcutaneous edema. No suspicious paraspinal inflammation. Disc levels: Capacious spinal canal at most levels. Age-appropriate lumbar spine degeneration at most levels. Conspicuous disc degeneration at L2-L3 (with trace vacuum disc yesterday and associated L2 Schmorl's node), L5-S1. No lumbar spinal stenosis. IMPRESSION: 1. No evidence of Lumbar discitis osteomyelitis. An L2 inferior endplate Schmorl's node is noted and was well demonstrated by CT yesterday. 2. Mild for age lumbar spine degeneration and capacious underlying spinal canal, no lumbar spinal stenosis. 3. Hemosiderosis. Electronically Signed   By: Marlise Simpers M.D.   On: 01/04/2024 04:38   CT ABDOMEN PELVIS WO  CONTRAST Result Date: 01/03/2024 CLINICAL DATA:  Fever EXAM: CT ABDOMEN AND PELVIS WITHOUT CONTRAST TECHNIQUE: Multidetector CT imaging of the abdomen and pelvis was performed following the standard protocol without IV contrast. RADIATION DOSE REDUCTION: This exam was performed according to the departmental dose-optimization program which includes automated exposure control, adjustment of the mA and/or kV according to patient size and/or use of iterative reconstruction technique. COMPARISON:  CT 01/15/2020 FINDINGS: Lower chest: See separately dictated chest CT Hepatobiliary: Gallstones. Gallbladder wall thickening versus trace pericholecystic fluid. No biliary dilatation. Possible surface nodularity of the liver Pancreas: Unremarkable. No pancreatic ductal dilatation or surrounding inflammatory changes. Spleen: Normal in size without focal abnormality. Adrenals/Urinary Tract: Stable adrenal glands. No hydronephrosis. Nonspecific left greater than right perinephric stranding. The bladder is slightly thick walled. Stomach/Bowel: The stomach is nonenlarged. Scattered fluid-filled small bowel without definitive obstruction. No acute bowel wall thickening. Diverticular disease of the colon Vascular/Lymphatic: Aortic atherosclerosis without aneurysm. Borderline Peri aortic lymph nodes measuring up to 11 mm. Reproductive: Possible uterine fibroids.  No suspicious adnexal mass Other: Negative for pelvic effusion or free air Musculoskeletal: No acute or suspicious osseous abnormality IMPRESSION: 1. Gallstones with gallbladder wall thickening versus trace pericholecystic fluid. Follow-up right upper quadrant ultrasound as indicated. 2. Diverticular disease of the colon without acute bowel wall thickening. 3. Nonspecific left greater than right perinephric stranding. Slight bladder wall thickening, correlate with urinalysis to exclude cystitis and ascending urinary tract infection. 4. Possible surface nodularity/liver  cirrhosis 5. Aortic atherosclerosis. Aortic Atherosclerosis (ICD10-I70.0). Electronically Signed   By: Esmeralda Hedge M.D.   On: 01/03/2024 22:44   CT CHEST WO CONTRAST Result Date: 01/03/2024 CLINICAL DATA:  Chest pain.  Respiratory illness. EXAM: CT CHEST WITHOUT CONTRAST TECHNIQUE: Multidetector CT imaging of the chest was performed following the standard protocol without IV contrast. RADIATION DOSE REDUCTION: This exam was performed according to the departmental dose-optimization program which includes automated exposure control, adjustment of the mA and/or kV according to patient size and/or use of iterative reconstruction technique. COMPARISON:  January 22, 2022. FINDINGS: Cardiovascular: Mild cardiomegaly. No pericardial effusion. Right internal jugular dialysis catheter is noted with distal tip at cavoatrial junction. Atherosclerosis of thoracic aorta without aneurysm formation. Mediastinum/Nodes: 3.4 cm left thyroid nodule is noted. Esophagus is unremarkable. No adenopathy. Lungs/Pleura: No pneumothorax or pleural effusion is noted. Mild peripheral septal thickening is noted bilaterally, right greater than left, concerning for possible edema or atypical inflammation. Upper Abdomen: No acute abnormality. Musculoskeletal: Probable subacute fractures are seen involving the posterior portions of the left sixth, seventh and eighth ribs. Probable subacute right seventh rib fracture is noted as well. IMPRESSION: Mild peripheral  septal thickening is noted in both lungs, right greater than left, concerning for possible edema or atypical inflammation. 3.4 cm left thyroid nodule is noted. Recommend thyroid US . (Ref: J Am Coll Radiol. 2015 Feb;12(2): 143-50). Multiple bilateral subacute rib fractures are noted. Aortic Atherosclerosis (ICD10-I70.0). Electronically Signed   By: Rosalene Colon M.D.   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

## 2024-01-05 NOTE — Progress Notes (Signed)
 Progress Note   Patient: Carol [name] ZOX:[id number] DOB: 03/24/1961 DOA: 01/03/2024     2 DOS: the patient was seen and examined on 01/05/2024   Brief hospital course: Carol [name] is a 63 y.o. female with medical history significant for ESRD HD MWF reports that she has been having subjective fevers and shaking chills for the last 2 to 3 days at home.  C/o mild shortness of breath no cough, diarrhea today at dialysis she says she just got much worse all of a sudden.  Found to have fever in ED, leukopenia, started on Rocephin  and Zithromax  admitted for fever and early sepsis.   Assessment and Plan: Sepsis unknown origin- Patient had fever, leukopenia, tachycardia, tachypnea. Blood cultures positive for candida tropicalis. Repeat cbc ordered.  Started on Micafungin therapy. Continue empiric Rocephin  therapy until cultures finalized. ID evaluation appreciated. Her indwelling catheter need to be removed with tip sent for culture.  Nephrology plan to do HD today before removing HD cath. Continue to follow vitals closely. Follow repeat blood cultures.  Cholelithiasis with possible cholecystitis: Seen on RUQ sono. She does not have abdominal pain, tenderness. AST high. Repeat CMP ordered. Will add metronidazole to Rocephin  therapy. Will get surgery evaluation.  ESRD on hemodialysis: Nephrology evaluation appreciated. She is on Monday Wednesday Friday schedule. HD tonight before HD cath removal.  Hypertension: Blood pressure elevated initially.  Today BP well-controlled. Continue Norvasc  10mg  daily.  Anemia of chronic disease: Hemoglobin stable around 9. No active bleeding.  Abnormal thyroid nodule found on CT scan: Thyroid ultrasound showed multiple nodule, one measuring 3.6 cm need FNA which can be arranged as outpatient.  TSH, free T4 normal.      Out of bed to chair. Incentive spirometry. Nursing supportive care. Fall, aspiration precautions. Diet:   Diet Orders (From admission, onward)     Start     Ordered   01/06/24 0001  Diet NPO time specified  Diet effective midnight        01/05/24 1143   01/03/24 2026  Diet renal with fluid restriction Fluid restriction: 1200 mL Fluid; Room service appropriate? Yes; Fluid consistency: Thin  Diet effective now       Question Answer Comment  Fluid restriction: 1200 mL Fluid   Room service appropriate? Yes   Fluid consistency: Thin      01/03/24 2025           DVT prophylaxis: heparin  injection 5,000 Units Start: 01/03/24 2200  Level of care: Telemetry Medical   Code Status: Full Code  Subjective: Patient is seen and examined today morning. She has dry cough. Eating fair. Remained afebrile. Advised to get out of bed.   Physical Exam: Vitals:   01/04/24 2357 01/05/24 0424 01/05/24 0802 01/05/24 1247  BP: 120/69 106/68 (!) 140/57 (!) 142/86  Pulse: 67 62 66 70  Resp: 18 18 18 17   Temp: 98.4 F (36.9 C) 98 F (36.7 C) 98 F (36.7 C) 98 F (36.7 C)  TempSrc: Oral Oral    SpO2: 100% 99% 100% 100%  Weight:      Height:        General - Elderly African American female, no apparent distress HEENT - PERRLA, EOMI, atraumatic head, non tender sinuses. Lung - Clear, basal rhonchi, no wheezes. Heart - S1, S2 heard, no murmurs, rubs, trace pedal edema. Abdomen - Soft, non tender, bowel sounds good Neuro - Alert, awake and oriented x 3, non focal exam. Skin - Warm and dry.  Data  Reviewed:      Latest Ref Rng & Units 01/04/2024    7:02 AM 01/03/2024    2:07 PM 12/10/2023    9:25 AM  CBC  WBC 4.0 - 10.5 K/uL 15.0  1.2    1.3  6.5   Hemoglobin 12.0 - 15.0 g/dL 9.0  40.9    81.1  91.4   Hematocrit 36.0 - 46.0 % 27.9  32.9    32.5  32.5   Platelets 150 - 400 K/uL 110  154    162  170       Latest Ref Rng & Units 01/04/2024    5:29 AM 01/03/2024    2:07 PM 12/10/2023    9:25 AM  BMP  Glucose 70 - 99 mg/dL 93  79  782   BUN 8 - 23 mg/dL 26  17  14    Creatinine 0.44 - 1.00  mg/dL 9.56  2.13  0.86   Sodium 135 - 145 mmol/L 136  138  139   Potassium 3.5 - 5.1 mmol/L 4.4  3.7  4.2   Chloride 98 - 111 mmol/L 101  104  104   CO2 22 - 32 mmol/L 23  20  24    Calcium  8.9 - 10.3 mg/dL 8.1  8.8  8.4    US  Abdomen Limited RUQ (LIVER/GB) Result Date: 01/04/2024 CLINICAL DATA:  Abnormal CT EXAM: ULTRASOUND ABDOMEN LIMITED RIGHT UPPER QUADRANT COMPARISON:  CT abdomen and pelvis 01/03/2024 FINDINGS: Gallbladder: Gallstones are present measuring up to 1.6 cm. Pericholecystic fluid is present. The gallbladder wall thickened measuring 10 mm. Sonographic Abigail Abler sign is negative per sonographer. Common bile duct: Diameter: 5.5 mm. Liver: No focal lesion identified. There is lobulated liver contour. Within normal limits in parenchymal echogenicity. Portal vein is patent on color Doppler imaging with normal direction of blood flow towards the liver. Other: None. IMPRESSION: 1. Cholelithiasis with gallbladder wall thickening and pericholecystic fluid. Findings are concerning for acute cholecystitis. 2. Lobulated liver contour, which can be seen in the setting of cirrhosis. Electronically Signed   By: Tyron Gallon M.D.   On: 01/04/2024 22:28   US  THYROID Result Date: 01/04/2024 CLINICAL DATA:  Nodule on CT chest EXAM: THYROID ULTRASOUND TECHNIQUE: Ultrasound examination of the thyroid gland and adjacent soft tissues was performed. COMPARISON:  CT 01/03/2024 FINDINGS: Parenchymal Echotexture: Mildly heterogenous Isthmus: 0.8 cm thickness Right lobe: 4 x 1.2 x 2.1 cm Left lobe: _________________________________________________________ Estimated total number of nodules >/= 1 cm: 3 Number of spongiform nodules >/=  2 cm not described below (TR1): 0 Number of mixed cystic and solid nodules >/= 1.5 cm not described below (TR2): 0 _________________________________________________________ Nodule # 1: Location: Right; inferior Maximum size: 1.6 cm; Other 2 dimensions: 1.2 x 1.5 cm Composition: mixed cystic  and solid (1) Echogenicity: isoechoic (1) Shape: not taller-than-wide (0) Margins: ill-defined (0) Echogenic foci: none (0) ACR TI-RADS total points: 2. ACR TI-RADS risk category: TR 2. ACR TI-RADS recommendations: This nodule does NOT meet TI-RADS criteria for biopsy or dedicated follow-up. _________________________________________________________ Nodule # 2: 0.9 cm hypoechoic nodule without calcifications, mid right; This nodule does NOT meet TI-RADS criteria for biopsy or dedicated follow-up. Nodule # 3: Location: Left; mid Maximum size: 3.6 cm; Other 2 dimensions: 2.7 x 2.9 cm Composition: mixed cystic and solid (1) Echogenicity: hyperechoic (1) Shape: taller-than-wide (3) Margins: ill-defined (0) Echogenic foci: none (0) ACR TI-RADS total points: 5. ACR TI-RADS risk category: TR 4. ACR TI-RADS recommendations: **Given size (>/= 1.5 cm) and appearance,  fine needle aspiration of this moderately suspicious nodule should be considered based on TI-RADS criteria. _________________________________________________________ Nodule # 4: Location: Left; inferior Maximum size: 1.2 cm; Other 2 dimensions: 1 x 1.1 cm Composition: solid/almost completely solid (2) Echogenicity: isoechoic (1) Shape: not taller-than-wide (0) Margins: ill-defined (0) Echogenic foci: none (0) ACR TI-RADS total points: 3. ACR TI-RADS risk category: TR 3. ACR TI-RADS recommendations: Given size (<1.4 cm) and appearance, this nodule does NOT meet TI-RADS criteria for biopsy or dedicated follow-up. _________________________________________________________ No regional cervical adenopathy. IMPRESSION: 3.6 cm left mid thyroid nodule, meeting criteria for FNA. Electronically Signed   By: Nicoletta Barrier M.D.   On: 01/04/2024 16:26   MR LUMBAR SPINE WO CONTRAST Result Date: 01/04/2024 CLINICAL DATA:  63 year old female with end-stage renal disease. Fevers and chills, diarrhea, possible sepsis. Back pain. EXAM: MRI LUMBAR SPINE WITHOUT CONTRAST TECHNIQUE:  Multiplanar, multisequence MR imaging of the lumbar spine was performed. No intravenous contrast was administered. COMPARISON:  CT Abdomen and Pelvis yesterday. Lumbar MRI 02/01/2014. FINDINGS: Segmentation:  Normal on the CT yesterday. Alignment:  Stable lumbar lordosis. Vertebrae: L2 inferior endplate Schmorl's node, new since 2015 but well documented on the CT yesterday with adjacent L2-L3 vacuum disc. Maintained lumbar vertebral height. 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 No suspicious marrow edema identified.  Visible sacrum intact. Conus medullaris and cauda equina: Conus extends to the T12-L1 level. No lower spinal cord or conus signal abnormality. Capacious spinal canal at most levels. Negative cauda equina nerve roots. Paraspinal and other soft tissues: Stable to the CT Abdomen and Pelvis yesterday, including posterior subcutaneous edema. No suspicious paraspinal inflammation. Disc levels: Capacious spinal canal at most levels. Age-appropriate lumbar spine degeneration at most levels. Conspicuous disc degeneration at L2-L3 (with trace vacuum disc yesterday and associated L2 Schmorl's node), L5-S1. No lumbar spinal stenosis. IMPRESSION: 1. No evidence of Lumbar discitis osteomyelitis. An L2 inferior endplate Schmorl's node is noted and was well demonstrated by CT yesterday. 2. Mild for age lumbar spine degeneration and capacious underlying spinal canal, no lumbar spinal stenosis. 3. Hemosiderosis. Electronically Signed   By: Marlise Simpers M.D.   On: 01/04/2024 04:38   CT ABDOMEN PELVIS WO CONTRAST Result Date: 01/03/2024 CLINICAL DATA:  Fever EXAM: CT ABDOMEN AND PELVIS WITHOUT CONTRAST TECHNIQUE: Multidetector CT imaging of the abdomen and pelvis was performed following the standard protocol without IV contrast. RADIATION DOSE REDUCTION: This exam was performed according to the departmental  dose-optimization program which includes automated exposure control, adjustment of the mA and/or kV according to patient size and/or use of iterative reconstruction technique. COMPARISON:  CT 01/15/2020 FINDINGS: Lower chest: See separately dictated chest CT Hepatobiliary: Gallstones. Gallbladder wall thickening versus trace pericholecystic fluid. No biliary dilatation. Possible surface nodularity of the liver Pancreas: Unremarkable. No pancreatic ductal dilatation or surrounding inflammatory changes. Spleen: Normal in size without focal abnormality. Adrenals/Urinary Tract: Stable adrenal glands. No hydronephrosis. Nonspecific left greater than right perinephric stranding. The bladder is slightly thick walled. Stomach/Bowel: The stomach is nonenlarged. Scattered fluid-filled small bowel without definitive obstruction. No acute bowel wall thickening. Diverticular disease of the colon Vascular/Lymphatic: Aortic atherosclerosis without aneurysm. Borderline Peri aortic lymph nodes measuring up to 11 mm. Reproductive: Possible uterine fibroids.  No suspicious adnexal mass Other: Negative for pelvic effusion or free air Musculoskeletal: No acute or suspicious osseous abnormality IMPRESSION: 1. Gallstones with gallbladder wall thickening versus trace pericholecystic  fluid. Follow-up right upper quadrant ultrasound as indicated. 2. Diverticular disease of the colon without acute bowel wall thickening. 3. Nonspecific left greater than right perinephric stranding. Slight bladder wall thickening, correlate with urinalysis to exclude cystitis and ascending urinary tract infection. 4. Possible surface nodularity/liver cirrhosis 5. Aortic atherosclerosis. Aortic Atherosclerosis (ICD10-I70.0). Electronically Signed   By: Esmeralda Hedge M.D.   On: 01/03/2024 22:44   CT CHEST WO CONTRAST Result Date: 01/03/2024 CLINICAL DATA:  Chest pain.  Respiratory illness. EXAM: CT CHEST WITHOUT CONTRAST TECHNIQUE: Multidetector CT imaging of  the chest was performed following the standard protocol without IV contrast. RADIATION DOSE REDUCTION: This exam was performed according to the departmental dose-optimization program which includes automated exposure control, adjustment of the mA and/or kV according to patient size and/or use of iterative reconstruction technique. COMPARISON:  January 22, 2022. FINDINGS: Cardiovascular: Mild cardiomegaly. No pericardial effusion. Right internal jugular dialysis catheter is noted with distal tip at cavoatrial junction. Atherosclerosis of thoracic aorta without aneurysm formation. Mediastinum/Nodes: 3.4 cm left thyroid nodule is noted. Esophagus is unremarkable. No adenopathy. Lungs/Pleura: No pneumothorax or pleural effusion is noted. Mild peripheral septal thickening is noted bilaterally, right greater than left, concerning for possible edema or atypical inflammation. Upper Abdomen: No acute abnormality. Musculoskeletal: Probable subacute fractures are seen involving the posterior portions of the left sixth, seventh and eighth ribs. Probable subacute right seventh rib fracture is noted as well. IMPRESSION: Mild peripheral septal thickening is noted in both lungs, right greater than left, concerning for possible edema or atypical inflammation. 3.4 cm left thyroid nodule is noted. Recommend thyroid US . (Ref: J Am Coll Radiol. 2015 Feb;12(2): 143-50). Multiple bilateral subacute rib fractures are noted. Aortic Atherosclerosis (ICD10-I70.0). Electronically Signed   By: Rosalene Colon M.D.   On: 01/03/2024 15:36   DG Chest Portable 1 View Result Date: 01/03/2024 CLINICAL DATA:  Chest pain EXAM: PORTABLE CHEST 1 VIEW COMPARISON:  Chest radiograph dated 12/10/2023 FINDINGS: Lines/tubes: Right internal jugular venous catheter tip projects over the superior cavoatrial junction. Lungs: Patient is rotated to the right. Bibasilar patchy opacities. Similar chronic-appearing perihilar pulmonary vasculature. Pleura: No  pneumothorax or pleural effusion. Heart/mediastinum: Similar enlarged cardiomediastinal silhouette. Bones: No acute osseous abnormality. IMPRESSION: 1. Bibasilar patchy opacities, which may represent atelectasis, aspiration, or pneumonia. 2. Similar cardiomegaly. Electronically Signed   By: Limin  Xu M.D.   On: 01/03/2024 14:18    Family Communication: Discussed with patient, understand and agree. All questions answered.  Disposition: Status is: Inpatient Remains inpatient appropriate because: sepsis, antibiotic need, follow blood cultures  Planned Discharge Destination: Home with Home Health     Time spent: 41 minutes  Author: Aisha Hove, MD 01/05/2024 1:19 PM Secure chat 7am to 7pm For on call review [email or web address].

## 2024-01-05 NOTE — Progress Notes (Signed)
 [name] Progress Note   Subjective:   Cultures showing candidemia. Pt is feeling better today, denies SOB, CP, dizziness. Reports some chronic diarrhea. She has TDC, discussed need for line holiday. Per notes, she need second stage AVF surgery before it will be usable.   Objective Vitals:   01/04/24 2056 01/04/24 2357 01/05/24 0424 01/05/24 0802  BP: (!) 149/73 120/69 106/68 (!) 140/57  Pulse: 76 67 62 66  Resp: 18 18 18 18   Temp: 98.7 F (37.1 C) 98.4 F (36.9 C) 98 F (36.7 C) 98 F (36.7 C)  TempSrc: Oral Oral Oral   SpO2: 100% 100% 99% 100%  Weight:      Height:       Physical Exam General: Alert female in NAD Heart: RRR, no murmrus, rubs or gallops Lungs: CTA bilaterally, respirations unlabored Abdomen: Soft, non-distended, +BS  Extremities: No edema b/l lower extremities Dialysis Access:  TDC, AVF + t/b but somewhat small   Additional Objective Labs: Basic Metabolic Panel: Recent Labs  Lab 01/03/24 1407 01/04/24 0529  NA 138 136  K 3.7 4.4  CL 104 101  CO2 20* 23  GLUCOSE 79 93  BUN 17 26*  CREATININE 4.92* 5.97*  CALCIUM  8.8* 8.1*   Liver Function Tests: Recent Labs  Lab 01/04/24 0529  AST 112*  ALT 44  ALKPHOS 124  BILITOT 1.0  PROT 6.3*  ALBUMIN  2.5*   No results for input(s): "LIPASE", "AMYLASE" in the last 168 hours. CBC: Recent Labs  Lab 01/03/24 1407 01/04/24 0702  WBC 1.3*  1.2* 15.0*  NEUTROABS 1.0*  --   HGB 10.5*  10.3* 9.0*  HCT 32.5*  32.9* 27.9*  MCV 91.5  91.6 92.1  PLT 162  154 110*   Blood Culture    Component Value Date/Time   SDES BLOOD RIGHT ARM 01/04/2024 0539   SPECREQUEST  01/04/2024 0539    BOTTLES DRAWN AEROBIC AND ANAEROBIC Blood Culture adequate volume   CULT  01/04/2024 0539    NO GROWTH 1 DAY Performed at [facility], [address], [place], [place] [address]    REPTSTATUS PENDING 01/04/2024 1660    Cardiac Enzymes: No results for input(s): "CKTOTAL", "CKMB",  "CKMBINDEX", "TROPONINI" in the last 168 hours. CBG: Recent Labs  Lab 01/04/24 0823 01/04/24 1210 01/04/24 1747 01/04/24 1822  GLUCAP 71 87 65* 76   Iron  Studies: No results for input(s): "IRON ", "TIBC", "TRANSFERRIN", "FERRITIN" in the last 72 hours. @lablastinr3 @ Studies/Results: US  Abdomen Limited RUQ (LIVER/GB) Result Date: 01/04/2024 CLINICAL DATA:  Abnormal CT EXAM: ULTRASOUND ABDOMEN LIMITED RIGHT UPPER QUADRANT COMPARISON:  CT abdomen and pelvis 01/03/2024 FINDINGS: Gallbladder: Gallstones are present measuring up to 1.6 cm. Pericholecystic fluid is present. The gallbladder wall thickened measuring 10 mm. Sonographic Abigail Abler sign is negative per sonographer. Common bile duct: Diameter: 5.5 mm. Liver: No focal lesion identified. There is lobulated liver contour. Within normal limits in parenchymal echogenicity. Portal vein is patent on color Doppler imaging with normal direction of blood flow towards the liver. Other: None. IMPRESSION: 1. Cholelithiasis with gallbladder wall thickening and pericholecystic fluid. Findings are concerning for acute cholecystitis. 2. Lobulated liver contour, which can be seen in the setting of cirrhosis. Electronically Signed   By: Tyron Gallon M.D.   On: 01/04/2024 22:28   US  THYROID Result Date: 01/04/2024 CLINICAL DATA:  Nodule on CT chest EXAM: THYROID ULTRASOUND TECHNIQUE: Ultrasound examination of the thyroid gland and adjacent soft tissues was performed. COMPARISON:  CT 01/03/2024 FINDINGS:  Parenchymal Echotexture: Mildly heterogenous Isthmus: 0.8 cm thickness Right lobe: 4 x 1.2 x 2.1 cm Left lobe: _________________________________________________________ Estimated total number of nodules >/= 1 cm: 3 Number of spongiform nodules >/=  2 cm not described below (TR1): 0 Number of mixed cystic and solid nodules >/= 1.5 cm not described below (TR2): 0 _________________________________________________________ Nodule # 1: Location: Right; inferior Maximum size:  1.6 cm; Other 2 dimensions: 1.2 x 1.5 cm Composition: mixed cystic and solid (1) Echogenicity: isoechoic (1) Shape: not taller-than-wide (0) Margins: ill-defined (0) Echogenic foci: none (0) ACR TI-RADS total points: 2. ACR TI-RADS risk category: TR 2. ACR TI-RADS recommendations: This nodule does NOT meet TI-RADS criteria for biopsy or dedicated follow-up. _________________________________________________________ Nodule # 2: 0.9 cm hypoechoic nodule without calcifications, mid right; This nodule does NOT meet TI-RADS criteria for biopsy or dedicated follow-up. Nodule # 3: Location: Left; mid Maximum size: 3.6 cm; Other 2 dimensions: 2.7 x 2.9 cm Composition: mixed cystic and solid (1) Echogenicity: hyperechoic (1) Shape: taller-than-wide (3) Margins: ill-defined (0) Echogenic foci: none (0) ACR TI-RADS total points: 5. ACR TI-RADS risk category: TR 4. ACR TI-RADS recommendations: **Given size (>/= 1.5 cm) and appearance, fine needle aspiration of this moderately suspicious nodule should be considered based on TI-RADS criteria. _________________________________________________________ Nodule # 4: Location: Left; inferior Maximum size: 1.2 cm; Other 2 dimensions: 1 x 1.1 cm Composition: solid/almost completely solid (2) Echogenicity: isoechoic (1) Shape: not taller-than-wide (0) Margins: ill-defined (0) Echogenic foci: none (0) ACR TI-RADS total points: 3. ACR TI-RADS risk category: TR 3. ACR TI-RADS recommendations: Given size (<1.4 cm) and appearance, this nodule does NOT meet TI-RADS criteria for biopsy or dedicated follow-up. _________________________________________________________ No regional cervical adenopathy. IMPRESSION: 3.6 cm left mid thyroid nodule, meeting criteria for FNA. Electronically Signed   By: Nicoletta Barrier M.D.   On: 01/04/2024 16:26   MR LUMBAR SPINE WO CONTRAST Result Date: 01/04/2024 CLINICAL DATA:  63 year old female with end-stage renal disease. Fevers and chills, diarrhea, possible  sepsis. Back pain. EXAM: MRI LUMBAR SPINE WITHOUT CONTRAST TECHNIQUE: Multiplanar, multisequence MR imaging of the lumbar spine was performed. No intravenous contrast was administered. COMPARISON:  CT Abdomen and Pelvis yesterday. Lumbar MRI 02/01/2014. FINDINGS: Segmentation:  Normal on the CT yesterday. Alignment:  Stable lumbar lordosis. Vertebrae: L2 inferior endplate Schmorl's node, new since 2015 but well documented on the CT yesterday with adjacent L2-L3 vacuum disc. Maintained lumbar vertebral height. 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 No suspicious marrow edema identified.  Visible sacrum intact. Conus medullaris and cauda equina: Conus extends to the T12-L1 level. No lower spinal cord or conus signal abnormality. Capacious spinal canal at most levels. Negative cauda equina nerve roots. Paraspinal and other soft tissues: Stable to the CT Abdomen and Pelvis yesterday, including posterior subcutaneous edema. No suspicious paraspinal inflammation. Disc levels: Capacious spinal canal at most levels. Age-appropriate lumbar spine degeneration at most levels. Conspicuous disc degeneration at L2-L3 (with trace vacuum disc yesterday and associated L2 Schmorl's node), L5-S1. No lumbar spinal stenosis. IMPRESSION: 1. No evidence of Lumbar discitis osteomyelitis. An L2 inferior endplate Schmorl's node is noted and was well demonstrated by CT yesterday. 2. Mild for age lumbar spine degeneration and capacious underlying spinal canal, no lumbar spinal stenosis. 3. Hemosiderosis. Electronically Signed   By: Marlise Simpers M.D.   On: 01/04/2024 04:38   CT ABDOMEN PELVIS WO CONTRAST Result Date: 01/03/2024  CLINICAL DATA:  Fever EXAM: CT ABDOMEN AND PELVIS WITHOUT CONTRAST TECHNIQUE: Multidetector CT imaging of the abdomen and pelvis was performed following the standard protocol without IV contrast. RADIATION  DOSE REDUCTION: This exam was performed according to the departmental dose-optimization program which includes automated exposure control, adjustment of the mA and/or kV according to patient size and/or use of iterative reconstruction technique. COMPARISON:  CT 01/15/2020 FINDINGS: Lower chest: See separately dictated chest CT Hepatobiliary: Gallstones. Gallbladder wall thickening versus trace pericholecystic fluid. No biliary dilatation. Possible surface nodularity of the liver Pancreas: Unremarkable. No pancreatic ductal dilatation or surrounding inflammatory changes. Spleen: Normal in size without focal abnormality. Adrenals/Urinary Tract: Stable adrenal glands. No hydronephrosis. Nonspecific left greater than right perinephric stranding. The bladder is slightly thick walled. Stomach/Bowel: The stomach is nonenlarged. Scattered fluid-filled small bowel without definitive obstruction. No acute bowel wall thickening. Diverticular disease of the colon Vascular/Lymphatic: Aortic atherosclerosis without aneurysm. Borderline Peri aortic lymph nodes measuring up to 11 mm. Reproductive: Possible uterine fibroids.  No suspicious adnexal mass Other: Negative for pelvic effusion or free air Musculoskeletal: No acute or suspicious osseous abnormality IMPRESSION: 1. Gallstones with gallbladder wall thickening versus trace pericholecystic fluid. Follow-up right upper quadrant ultrasound as indicated. 2. Diverticular disease of the colon without acute bowel wall thickening. 3. Nonspecific left greater than right perinephric stranding. Slight bladder wall thickening, correlate with urinalysis to exclude cystitis and ascending urinary tract infection. 4. Possible surface nodularity/liver cirrhosis 5. Aortic atherosclerosis. Aortic Atherosclerosis (ICD10-I70.0). Electronically Signed   By: Esmeralda Hedge M.D.   On: 01/03/2024 22:44   CT CHEST WO CONTRAST Result Date: 01/03/2024 CLINICAL DATA:  Chest pain.  Respiratory illness.  EXAM: CT CHEST WITHOUT CONTRAST TECHNIQUE: Multidetector CT imaging of the chest was performed following the standard protocol without IV contrast. RADIATION DOSE REDUCTION: This exam was performed according to the departmental dose-optimization program which includes automated exposure control, adjustment of the mA and/or kV according to patient size and/or use of iterative reconstruction technique. COMPARISON:  January 22, 2022. FINDINGS: Cardiovascular: Mild cardiomegaly. No pericardial effusion. Right internal jugular dialysis catheter is noted with distal tip at cavoatrial junction. Atherosclerosis of thoracic aorta without aneurysm formation. Mediastinum/Nodes: 3.4 cm left thyroid nodule is noted. Esophagus is unremarkable. No adenopathy. Lungs/Pleura: No pneumothorax or pleural effusion is noted. Mild peripheral septal thickening is noted bilaterally, right greater than left, concerning for possible edema or atypical inflammation. Upper Abdomen: No acute abnormality. Musculoskeletal: Probable subacute fractures are seen involving the posterior portions of the left sixth, seventh and eighth ribs. Probable subacute right seventh rib fracture is noted as well. IMPRESSION: Mild peripheral septal thickening is noted in both lungs, right greater than left, concerning for possible edema or atypical inflammation. 3.4 cm left thyroid nodule is noted. Recommend thyroid US . (Ref: J Am Coll Radiol. 2015 Feb;12(2): 143-50). Multiple bilateral subacute rib fractures are noted. Aortic Atherosclerosis (ICD10-I70.0). Electronically Signed   By: Rosalene Colon M.D.   On: 01/03/2024 15:36   DG Chest Portable 1 View Result Date: 01/03/2024 CLINICAL DATA:  Chest pain EXAM: PORTABLE CHEST 1 VIEW COMPARISON:  Chest radiograph dated 12/10/2023 FINDINGS: Lines/tubes: Right internal jugular venous catheter tip projects over the superior cavoatrial junction. Lungs: Patient is rotated to the right. Bibasilar patchy opacities. Similar  chronic-appearing perihilar pulmonary vasculature. Pleura: No pneumothorax or pleural effusion. Heart/mediastinum: Similar enlarged cardiomediastinal silhouette. Bones: No acute osseous abnormality. IMPRESSION: 1. Bibasilar patchy opacities, which may represent atelectasis, aspiration, or pneumonia. 2. Similar cardiomegaly. Electronically Signed  By: Limin  Xu M.D.   On: 01/03/2024 14:18   Medications:  cefTRIAXone  (ROCEPHIN )  IV 2 g (01/05/24 0918)   micafungin (MYCAMINE) 100 mg in sodium chloride  0.9 % 100 mL IVPB 100 mg (01/04/24 2225)    amLODipine   10 mg Oral Daily   aspirin  EC  81 mg Oral Daily   atorvastatin   80 mg Oral Daily   Chlorhexidine  Gluconate Cloth  6 each Topical Q0600   gabapentin   100 mg Oral BID   heparin   5,000 Units Subcutaneous Q8H   pantoprazole   40 mg Oral QHS    Dialysis Orders  [facility] M[facility]  4h   B500   70.5kg    TDC    Heparin  2000 Has been coming off 2-4kg over last 2 wks Had 30 min Rx on 5/16    Assessment/Plan: Fever / leukopenia: blood cultures showing candidemia. ID on board. Needs line holiday. Did not get HD yesterday, will plan for HD tonight and consult IR for line removal tomorrow.  ESRD: on HD MWF. Had full HD Wed, minimal dialysis on Friday. 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 Next dose due 5/19. Holding IV Fe with infection  Secondary hyperparathyroidism: CCa in range, check phos with next labs.   Ramona Burner, PA-C 01/05/2024, 11:34 AM  [facility] Pager: [phone]

## 2024-01-05 NOTE — Plan of Care (Signed)

## 2024-01-06 ENCOUNTER — Inpatient Hospital Stay (HOSPITAL_COMMUNITY)

## 2024-01-06 ENCOUNTER — Encounter (HOSPITAL_COMMUNITY): Payer: Self-pay | Admitting: Internal Medicine

## 2024-01-06 DIAGNOSIS — I160 Hypertensive urgency: Secondary | ICD-10-CM | POA: Diagnosis not present

## 2024-01-06 DIAGNOSIS — B377 Candidal sepsis: Secondary | ICD-10-CM | POA: Diagnosis not present

## 2024-01-06 DIAGNOSIS — K8018 Calculus of gallbladder with other cholecystitis without obstruction: Secondary | ICD-10-CM | POA: Diagnosis not present

## 2024-01-06 DIAGNOSIS — A419 Sepsis, unspecified organism: Secondary | ICD-10-CM | POA: Diagnosis not present

## 2024-01-06 HISTORY — PX: IR REMOVAL TUN CV CATH W/O FL: IMG2289

## 2024-01-06 LAB — CBC
HCT: 28.3 % — ABNORMAL LOW (ref 36.0–46.0)
Hemoglobin: 9.2 g/dL — ABNORMAL LOW (ref 12.0–15.0)
MCH: 28.9 pg (ref 26.0–34.0)
MCHC: 32.5 g/dL (ref 30.0–36.0)
MCV: 89 fL (ref 80.0–100.0)
Platelets: 134 10*3/uL — ABNORMAL LOW (ref 150–400)
RBC: 3.18 MIL/uL — ABNORMAL LOW (ref 3.87–5.11)
RDW: 16.8 % — ABNORMAL HIGH (ref 11.5–15.5)
WBC: 8.2 10*3/uL (ref 4.0–10.5)
nRBC: 0 % (ref 0.0–0.2)

## 2024-01-06 LAB — BASIC METABOLIC PANEL WITH GFR
Anion gap: 12 (ref 5–15)
BUN: 22 mg/dL (ref 8–23)
CO2: 24 mmol/L (ref 22–32)
Calcium: 8.1 mg/dL — ABNORMAL LOW (ref 8.9–10.3)
Chloride: 96 mmol/L — ABNORMAL LOW (ref 98–111)
Creatinine, Ser: 4.56 mg/dL — ABNORMAL HIGH (ref 0.44–1.00)
GFR, Estimated: 10 mL/min — ABNORMAL LOW (ref >60)
Glucose, Bld: 94 mg/dL (ref 70–99)
Potassium: 3.8 mmol/L (ref 3.5–5.1)
Sodium: 132 mmol/L — ABNORMAL LOW (ref 135–145)

## 2024-01-06 MED ORDER — HEPARIN SODIUM (PORCINE) 1000 UNIT/ML IJ SOLN
INTRAMUSCULAR | Status: AC
  Filled 2024-01-06: qty 2

## 2024-01-06 MED ORDER — HYDRALAZINE HCL 20 MG/ML IJ SOLN
2.0000 mg | INTRAMUSCULAR | Status: DC | PRN
Start: 2024-01-06 — End: 2024-01-06

## 2024-01-06 MED ORDER — AMLODIPINE BESYLATE 10 MG PO TABS
10.0000 mg | ORAL_TABLET | Freq: Every day | ORAL | Status: DC
  Administered 2024-01-06 – 2024-01-16 (×8): 10 mg via ORAL
  Filled 2024-01-06 (×12): qty 1

## 2024-01-06 MED ORDER — HYDRALAZINE HCL 20 MG/ML IJ SOLN
5.0000 mg | INTRAMUSCULAR | Status: AC | PRN
  Administered 2024-01-08 – 2024-01-10 (×2): 5 mg via INTRAVENOUS
  Filled 2024-01-06 (×2): qty 1

## 2024-01-06 MED ORDER — LIDOCAINE HCL 1 % IJ SOLN
10.0000 mL | Freq: Once | INTRAMUSCULAR | Status: AC
  Administered 2024-01-06: 8 mL via INTRADERMAL
  Filled 2024-01-06: qty 10

## 2024-01-06 MED ORDER — PROSOURCE PLUS PO LIQD
30.0000 mL | Freq: Two times a day (BID) | ORAL | Status: DC
  Administered 2024-01-06 – 2024-01-16 (×16): 30 mL via ORAL
  Filled 2024-01-06 (×15): qty 30

## 2024-01-06 MED ORDER — HYDRALAZINE HCL 20 MG/ML IJ SOLN: 5.0000 mg | Freq: Four times a day (QID) | INTRAMUSCULAR | Status: DC | PRN

## 2024-01-06 MED ORDER — LIDOCAINE HCL 1 % IJ SOLN
INTRAMUSCULAR | Status: AC
  Filled 2024-01-06: qty 20

## 2024-01-06 MED ORDER — PROSOURCE PLUS PO LIQD
ORAL | Status: AC
Start: 2024-01-06 — End: 2024-01-07
  Filled 2024-01-06: qty 30

## 2024-01-06 NOTE — Progress Notes (Signed)
 PHARMACY - PHYSICIAN COMMUNICATION CRITICAL VALUE ALERT - BLOOD CULTURE IDENTIFICATION (BCID)  [name] is an 63 y.o. female who presented to [facility] on 01/03/2024 with a chief complaint of sepsis  Assessment:  2 bottles previously positive, 3rd bottle now back positive   Name of physician (or Provider) Contacted: Dr. Del Favia  Current antibiotics: Ceftriaxone , Flagyl , Micafungin    Changes to prescribed antibiotics recommended:  No changes  ID following  Results for orders placed or performed during the hospital encounter of 01/03/24  Blood Culture ID Panel (Reflexed) (Collected: 01/03/2024  2:59 PM)  Result Value Ref Range   Enterococcus faecalis NOT DETECTED NOT DETECTED   Enterococcus Faecium NOT DETECTED NOT DETECTED   Listeria monocytogenes NOT DETECTED NOT DETECTED   Staphylococcus species NOT DETECTED NOT DETECTED   Staphylococcus aureus (BCID) NOT DETECTED NOT DETECTED   Staphylococcus epidermidis NOT DETECTED NOT DETECTED   Staphylococcus lugdunensis NOT DETECTED NOT DETECTED   Streptococcus species NOT DETECTED NOT DETECTED   Streptococcus agalactiae NOT DETECTED NOT DETECTED   Streptococcus pneumoniae NOT DETECTED NOT DETECTED   Streptococcus pyogenes NOT DETECTED NOT DETECTED   A.calcoaceticus-baumannii NOT DETECTED NOT DETECTED   Bacteroides fragilis NOT DETECTED NOT DETECTED   Enterobacterales NOT DETECTED NOT DETECTED   Enterobacter cloacae complex NOT DETECTED NOT DETECTED   Escherichia coli NOT DETECTED NOT DETECTED   Klebsiella aerogenes NOT DETECTED NOT DETECTED   Klebsiella oxytoca NOT DETECTED NOT DETECTED   Klebsiella pneumoniae NOT DETECTED NOT DETECTED   Proteus species NOT DETECTED NOT DETECTED   Salmonella species NOT DETECTED NOT DETECTED   Serratia marcescens NOT DETECTED NOT DETECTED   Haemophilus influenzae NOT DETECTED NOT DETECTED   Neisseria meningitidis NOT DETECTED NOT DETECTED   Pseudomonas aeruginosa NOT DETECTED NOT DETECTED    Stenotrophomonas maltophilia NOT DETECTED NOT DETECTED   Candida albicans NOT DETECTED NOT DETECTED   Candida auris NOT DETECTED NOT DETECTED   Candida glabrata NOT DETECTED NOT DETECTED   Candida krusei NOT DETECTED NOT DETECTED   Candida parapsilosis NOT DETECTED NOT DETECTED   Candida tropicalis DETECTED (A) NOT DETECTED   Cryptococcus neoformans/gattii NOT DETECTED NOT DETECTED    Silvestre Drum 01/06/2024  1:02 AM

## 2024-01-06 NOTE — Progress Notes (Signed)
 Progress Note   Patient: Carol [name] ZOX:[id number] DOB: Jul 27, 1961 DOA: 01/03/2024     3 DOS: the patient was seen and examined on 01/06/2024   Brief hospital course: [name] is a 63 y.o. female with medical history significant for ESRD HD MWF reports that she has been having subjective fevers and shaking chills for the last 2 to 3 days at home.  C/o mild shortness of breath no cough, diarrhea today at dialysis she says she just got much worse all of a sudden.  Found to have fever in ED, leukopenia, started on Rocephin  and Zithromax  admitted for fever and early sepsis.   ID advised to remove HD catheter. RUQ sono  Assessment and Plan: Sepsis unknown origin Candida tropicalis fungemia- Patient still spiking fever. Tmax 102.2 has chills during HD yesterday. Blood cultures positive for candida tropicalis.  Continue Micafungin  therapy.  ID follow up appreciated. HD cath removed. 2d echo no vegetation. Will get repeat blood cultures.  HD cath removed 01/06/24 after HD 01/05/24 Continue to follow vitals closely. Follow repeat blood cultures.  Cholelithiasis with possible cholecystitis: Seen on RUQ sono. Alk phos high.  Continue metronidazole  and Rocephin  therapy. Surgery evaluation appreciated, continue to follow clinically, may go for cholecystectomy vs cholecystostomy this visit.  ESRD on hemodialysis: Nephrology evaluation appreciated. She is on Monday Wednesday Friday schedule. HD 5/18 HD cath removed 5/19 for line holiday. Follow repeat cultures.  Hypertensive urgency- Blood pressure elevated in the night. Continue Norvasc  10mg  daily. IV hydralazine  PRN ordered.  Anemia of chronic disease: Hemoglobin stable around 9. No active bleeding.  Abnormal thyroid  nodule found on CT scan: Thyroid  ultrasound showed multiple nodule, one measuring 3.6 cm need FNA which can be arranged as outpatient.  TSH, free T4 normal.      Out of bed to chair. Incentive  spirometry. Nursing supportive care. Fall, aspiration precautions. Diet:  Diet Orders (From admission, onward)     Start     Ordered   01/06/24 1124  Diet renal with fluid restriction Fluid restriction: 1200 mL Fluid; Room service appropriate? Yes; Fluid consistency: Thin  Diet effective now       Question Answer Comment  Fluid restriction: 1200 mL Fluid   Room service appropriate? Yes   Fluid consistency: Thin      01/06/24 1123           DVT prophylaxis: heparin  injection 5,000 Units Start: 01/03/24 2200  Level of care: Telemetry Medical   Code Status: Full Code  Subjective: Patient is seen and examined today morning. She feels better today. But has chills, rigors yesterday during HD. Tmax 102.2. able to get out of bed.  Physical Exam: Vitals:   01/06/24 0244 01/06/24 0333 01/06/24 0536 01/06/24 0819  BP: (!) 217/92 (!) 188/69 (!) 142/63 (!) 124/55  Pulse: (!) 112 98 92 72  Resp: (!) 31 16 18 18   Temp: 98.4 F (36.9 C)  (!) 102.2 F (39 C) 98.4 F (36.9 C)  TempSrc: Oral  Oral   SpO2: 91% 94% 94% 100%  Weight:      Height:        General - Elderly African American female, no apparent distress HEENT - PERRLA, EOMI, atraumatic head, non tender sinuses. Lung - Clear, basal rhonchi, no wheezes. Heart - S1, S2 heard, no murmurs, rubs, trace pedal edema. Abdomen - Soft, non tender, bowel sounds good Neuro - Alert, awake and oriented x 3, non focal exam. Skin - Warm and dry.  Data  Reviewed:      Latest Ref Rng & Units 01/06/2024    5:32 AM 01/04/2024    7:02 AM 01/03/2024    2:07 PM  CBC  WBC 4.0 - 10.5 K/uL 8.2  15.0  1.2    1.3   Hemoglobin 12.0 - 15.0 g/dL 9.2  9.0  16.1    09.6   Hematocrit 36.0 - 46.0 % 28.3  27.9  32.9    32.5   Platelets 150 - 400 K/uL 134  110  154    162       Latest Ref Rng & Units 01/06/2024    5:32 AM 01/05/2024    2:27 PM 01/04/2024    5:29 AM  BMP  Glucose 70 - 99 mg/dL 94  91  93   BUN 8 - 23 mg/dL 22  41  26    Creatinine 0.44 - 1.00 mg/dL 0.45  4.09  8.11   Sodium 135 - 145 mmol/L 132  134  136   Potassium 3.5 - 5.1 mmol/L 3.8  5.3  4.4   Chloride 98 - 111 mmol/L 96  100  101   CO2 22 - 32 mmol/L 24  20  23    Calcium  8.9 - 10.3 mg/dL 8.1  8.5  8.1    IR Removal Tun Cv Cath W/O FL Result Date: 01/06/2024 INDICATION: End-stage renal disease on hemodialysis. Candidemia. Request removal of tunneled HD catheter. Catheter originally placed by surgical service 07/2023 EXAM: REMOVAL OF TUNNELED RIGHT IJ HEMODIALYSIS CATHETER MEDICATIONS: 1% plain lidocaine , 8 mL COMPLICATIONS: None immediate. PROCEDURE: Informed written consent was obtained from the patient following an explanation of the procedure, risks, benefits and alternatives to treatment. A time out was performed prior to the initiation of the procedure. Maximal barrier sterile technique was utilized including caps, mask, sterile gowns, sterile gloves, large sterile drape, hand hygiene, and chlorhexidine . 1% lidocaine  was injected under sterile conditions along the subcutaneous tunnel. Utilizing a combination of blunt dissection and gentle traction, the cuff of the catheter was exposed and the catheter was removed intact. Hemostasis was obtained with manual compression. A dressing was placed. The patient tolerated the procedure well without immediate post procedural complication. IMPRESSION: Successful removal of tunneled right IJ hemodialysis catheter. Procedure performed by Prudence Brown PA-C supervised by Dr. Lowanda Ruddy Electronically Signed   By: Erica Hau M.D.   On: 01/06/2024 09:37   ECHOCARDIOGRAM COMPLETE Result Date: 01/05/2024    ECHOCARDIOGRAM REPORT   Patient Name:   Carol [name] Date of Exam: 01/05/2024 Medical Rec #:  [id number]               Height:       64.0 in Accession #:    [id number]              Weight:       147.0 lb Date of Birth:  Apr 08, 1961               BSA:          1.716 m Patient Age:    63 years                 BP:           142/86 mmHg Patient Gender: F                       HR:           66 bpm. Exam Location:  Inpatient Procedure: 2D Echo, Cardiac Doppler, Color Doppler and Intracardiac            Opacification Agent (Both Spectral and Color Flow Doppler were            utilized during procedure). Indications:    Endocarditis  History:        Patient has prior history of Echocardiogram examinations, most                 recent 08/02/2023. Risk Factors:Diabetes and Hypertension.  Sonographer:    Janette Medley Referring Phys: [id number] Alica Inks IMPRESSIONS  1. Left ventricular ejection fraction, by estimation, is 70 to 75%. The left ventricle has hyperdynamic function. The left ventricle has no regional wall motion abnormalities. There is mild concentric left ventricular hypertrophy. Left ventricular diastolic parameters are indeterminate.  2. Right ventricular systolic function is normal. The right ventricular size is normal.  3. Left atrial size was mildly dilated.  4. Right atrial size was mildly dilated.  5. The mitral valve is abnormal. Mild mitral valve regurgitation. No evidence of mitral stenosis.  6. The aortic valve is normal in structure. Aortic valve regurgitation is not visualized. No aortic stenosis is present.  7. The inferior vena cava is dilated in size with >50% respiratory variability, suggesting right atrial pressure of 8 mmHg. Conclusion(s)/Recommendation(s): No evidence of valvular vegetations on this transthoracic echocardiogram. Consider a transesophageal echocardiogram to exclude infective endocarditis if clinically indicated. FINDINGS  Left Ventricle: Left ventricular ejection fraction, by estimation, is 70 to 75%. The left ventricle has hyperdynamic function. The left ventricle has no regional wall motion abnormalities. The left ventricular internal cavity size was normal in size. There is mild concentric left ventricular hypertrophy. Left ventricular diastolic parameters are  indeterminate. Right Ventricle: The right ventricular size is normal. No increase in right ventricular wall thickness. Right ventricular systolic function is normal. Left Atrium: Left atrial size was mildly dilated. Right Atrium: Right atrial size was mildly dilated. Pericardium: There is no evidence of pericardial effusion. Mitral Valve: The mitral valve is abnormal. There is mild calcification of the mitral valve leaflet(s). Mild mitral annular calcification. Mild mitral valve regurgitation. No evidence of mitral valve stenosis. Tricuspid Valve: The tricuspid valve is normal in structure. Tricuspid valve regurgitation is trivial. No evidence of tricuspid stenosis. Aortic Valve: The aortic valve is normal in structure. Aortic valve regurgitation is not visualized. No aortic stenosis is present. Pulmonic Valve: The pulmonic valve was normal in structure. Pulmonic valve regurgitation is not visualized. No evidence of pulmonic stenosis. Aorta: The aortic root is normal in size and structure. Venous: The inferior vena cava is dilated in size with greater than 50% respiratory variability, suggesting right atrial pressure of 8 mmHg. IAS/Shunts: No atrial level shunt detected by color flow Doppler.  LEFT VENTRICLE PLAX 2D LVIDd:         4.30 cm   Diastology LVIDs:         2.80 cm   LV e' medial:    8.59 cm/s LV PW:         1.00 cm   LV E/e' medial:  16.8 LV IVS:        1.10 cm   LV e' lateral:   8.38 cm/s LVOT diam:     1.80 cm   LV E/e' lateral: 17.2 LV SV:         83 LV SV Index:   48 LVOT Area:     2.54 cm  RIGHT VENTRICLE  IVC RV S prime:     16.10 cm/s  IVC diam: 1.80 cm TAPSE (M-mode): 2.2 cm LEFT ATRIUM             Index        RIGHT ATRIUM           Index LA diam:        4.50 cm 2.62 cm/m   RA Area:     17.20 cm LA Vol (A2C):   61.8 ml 36.01 ml/m  RA Volume:   51.20 ml  29.83 ml/m LA Vol (A4C):   54.8 ml 31.93 ml/m LA Biplane Vol: 59.7 ml 34.78 ml/m  AORTIC VALVE LVOT Vmax:   150.00 cm/s LVOT  Vmean:  103.000 cm/s LVOT VTI:    0.325 m  AORTA Ao Root diam: 2.60 cm Ao Asc diam:  3.10 cm MITRAL VALVE MV Area (PHT): 4.39 cm     SHUNTS MV Decel Time: 173 msec     Systemic VTI:  0.32 m MV E velocity: 144.00 cm/s  Systemic Diam: 1.80 cm MV A velocity: 72.80 cm/s MV E/A ratio:  1.98 Jules Oar MD Electronically signed by Jules Oar MD Signature Date/Time: 01/05/2024/3:51:08 PM    Final    US  Abdomen Limited RUQ (LIVER/GB) Result Date: 01/04/2024 CLINICAL DATA:  Abnormal CT EXAM: ULTRASOUND ABDOMEN LIMITED RIGHT UPPER QUADRANT COMPARISON:  CT abdomen and pelvis 01/03/2024 FINDINGS: Gallbladder: Gallstones are present measuring up to 1.6 cm. Pericholecystic fluid is present. The gallbladder wall thickened measuring 10 mm. Sonographic Abigail Abler sign is negative per sonographer. Common bile duct: Diameter: 5.5 mm. Liver: No focal lesion identified. There is lobulated liver contour. Within normal limits in parenchymal echogenicity. Portal vein is patent on color Doppler imaging with normal direction of blood flow towards the liver. Other: None. IMPRESSION: 1. Cholelithiasis with gallbladder wall thickening and pericholecystic fluid. Findings are concerning for acute cholecystitis. 2. Lobulated liver contour, which can be seen in the setting of cirrhosis. Electronically Signed   By: Tyron Gallon M.D.   On: 01/04/2024 22:28    Family Communication: Discussed with patient, understand and agree. All questions answered.  Disposition: Status is: Inpatient Remains inpatient appropriate because: sepsis, antifungal, antibiotics, follow blood cultures, surgery follow up  Planned Discharge Destination: Home with Home Health     MDM level 3- she is ESRD patient with sepsis, fungemia, spiking fever, on antifungals, HD cath removed, ?cholecystitis on rocephin , flagyl , surgery on board. She is at high risk for sudden clinical deterioration.  Author: Aisha Hove, MD 01/06/2024 1:17 PM Secure  chat 7am to 7pm For on call review [email or web address].

## 2024-01-06 NOTE — Care Management Obs Status (Signed)
 MEDICARE OBSERVATION STATUS NOTIFICATION   Patient Details  Name: Carol [name] MRN: [id number] Date of Birth: September 21, 1960   Medicare Observation Status Notification Given:       Wynonia Hedges 01/06/2024, 4:25 PM

## 2024-01-06 NOTE — Plan of Care (Signed)
 Await hd line removal Line holiday  Would advise temporary cath placement for hd on ideally 5/21 (5/20 if needed by nephrology)  Pending tee/clearance of bcx prior to a more permanent hd cath placement

## 2024-01-06 NOTE — Plan of Care (Signed)

## 2024-01-06 NOTE — Consult Note (Addendum)
 Carol [name] [birth year]  [id number].    Requesting MD: Butch Cashing, MD Chief Complaint/Reason for Consult: possible cholecystitis   HPI:  Carol [name] is a 63 y/o F with a PMH ESRD on HD MWF, NSTEMI 2023, GERD, HTN, and anemia of chronic disease who presented on 5/16 with 2 days of fevers and chills. Reports some diarrhea had home that stopped on the day of admission. Denies cough, chest pain, abdominal pain, nausea, or vomiting at present. Denies blood in her stool. She denies the use of blood thinners. States she is a former smoker who quit in feb 2025. Surgical history of tubal ligation.  ED workup significant for fever ~ 102, hypertension, leukopenia (1.2), and tachypnea. Blood cultures positive for candida tropicalis. She has a LUE AV fistula for dialysis and vascular surgery is consulted for removal. ID was consulted due to bacteremia. MR lumbae spine ordered due to back pain and fever and is negative. CT of the chest/abdomen/pelvis shows atypical inflammatory changes of her lungs as well as gallstones with trace pericholecystic fluid. RUQ U/S shows stones, wall thickening, and pericholecystic fluid. Both of this studies also show a nodular liver contour concerning for possible cirrhosis. General surgery is asked to consult for possible cholecystitis.   ROS: Review of Systems  All other systems reviewed and are negative.   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   Chronic back pain    Depression    Diabetes mellitus    ESRD (end stage renal disease) on dialysis [facility])    M,W,F   GERD (gastroesophageal reflux disease)    Heart murmur    Hypertension    NSTEMI (non-ST elevated myocardial  infarction) (HCC) 12/20/2021   Palpitations    Parkinson's disease (HCC)     Past Surgical History:  Procedure Laterality Date   AV FISTULA PLACEMENT Left 08/01/2023   Procedure: LEFT ARM BRACHIOBASILIC FISTULA CREATION;  Surgeon: Philipp Brawn, MD;  Location: [facility] OR;  Service: Vascular;  Laterality: Left;   BALLOON DILATION N/A 11/06/2022   Procedure: BALLOON DILATION;  Surgeon: Sergio Dandy, MD;  Location: [facility] ENDOSCOPY;  Service: Gastroenterology;  Laterality: N/A;   CARPAL TUNNEL RELEASE     COLONOSCOPY WITH PROPOFOL  N/A 11/06/2022   Procedure: COLONOSCOPY WITH PROPOFOL ;  Surgeon: Sergio Dandy, MD;  Location: [facility] ENDOSCOPY;  Service: Gastroenterology;  Laterality: N/A;   ESOPHAGOGASTRODUODENOSCOPY (EGD) WITH PROPOFOL  N/A 11/06/2022   Procedure: ESOPHAGOGASTRODUODENOSCOPY (EGD) WITH PROPOFOL ;  Surgeon: Sergio Dandy, MD;  Location: [facility] ENDOSCOPY;  Service: Gastroenterology;  Laterality: N/A;   INSERTION OF DIALYSIS CATHETER Right 08/01/2023   Procedure: INSERTION OF TUNNELED DIALYSIS CATHETER;  Surgeon: Philipp Brawn, MD;  Location: [facility] OR;  Service: Vascular;  Laterality: Right;   IR FLUORO GUIDE CV LINE RIGHT  01/26/2022   IR REMOVAL TUN CV CATH W/O FL  01/06/2024   IR US  GUIDE VASC ACCESS RIGHT  01/26/2022   LEFT HEART CATH AND CORONARY ANGIOGRAPHY N/A 12/20/2021   Procedure: LEFT HEART CATH AND CORONARY ANGIOGRAPHY;  Surgeon: Odie Benne, MD;  Location: [facility] INVASIVE CV LAB;  Service: Cardiovascular;  Laterality: N/A;  OPEN REDUCTION INTERNAL FIXATION (ORIF) FINGER WITH RADIAL BONE GRAFT Right 09/12/2015   Procedure: RIGHT MIDDLE FINGER CLOSED REDUCTION AND PINNING;  Surgeon: Arvil Birks, MD;  Location: [facility] OR;  Service: Orthopedics;  Laterality: Right;   TONSILLECTOMY     TUBAL LIGATION      Social History:  reports that she has been smoking cigarettes. She has a 0.8 pack-year smoking history. She has been exposed to tobacco smoke. She has never  used smokeless tobacco. She reports current alcohol use. She reports current drug use. Drug: Cocaine .  Allergies:  Allergies  Allergen Reactions   Penicillins Shortness Of Breath and Other (See Comments)    Caused yeast infection Has patient had a PCN reaction causing immediate rash, facial/tongue/throat swelling, SOB or lightheadedness with hypotension: Yes Has patient had a PCN reaction causing severe rash involving mucus membranes or skin necrosis: No Has patient had a PCN reaction that required hospitalization pt was in the hospital at the time of the reaction Has patient had a PCN reaction occurring within the last 10 years: No If all of the above answers are "NO", then may proceed with Cephalosp   Ultram  [Tramadol ] Other (See Comments)    Made her tongue raw    Medications Prior to Admission  Medication Sig Dispense Refill   albuterol  (PROVENTIL  HFA;VENTOLIN  HFA) 108 (90 Base) MCG/ACT inhaler Inhale 2 puffs into the lungs every 4 (four) hours as needed for wheezing or shortness of breath.     albuterol  (VENTOLIN  HFA) 108 (90 Base) MCG/ACT inhaler Inhale 1-2 puffs into the lungs every 6 (six) hours as needed for wheezing or shortness of breath. 1 each 0   amLODipine  (NORVASC ) 10 MG tablet Take 1 tablet (10 mg total) by mouth daily. 30 tablet 3   aspirin  EC 81 MG tablet Take 1 tablet (81 mg total) by mouth daily. Swallow whole. 30 tablet 0   atorvastatin  (LIPITOR ) 80 MG tablet Take 1 tablet (80 mg total) by mouth daily. 30 tablet 0   budesonide -formoterol  (SYMBICORT) 160-4.5 MCG/ACT inhaler Inhale 2 puffs into the lungs in the morning and at bedtime.     cetirizine (ZYRTEC) 10 MG tablet Take 10 mg by mouth daily as needed for allergies.     Darbepoetin Alfa  (ARANESP ) 60 MCG/0.3ML SOSY injection Inject 0.3 mLs (60 mcg total) into the skin every Thursday at 6pm.     eszopiclone 3 MG TABS Take 3 mg by mouth at bedtime. Take immediately before bedtime     gabapentin  (NEURONTIN ) 100 MG  capsule Take 1 capsule (100 mg total) by mouth 2 (two) times daily. 60 capsule 0   glucagon  1 MG injection Follow package directions for low blood sugar. 1 each 1   guaiFENesin -dextromethorphan (ROBITUSSIN DM) 100-10 MG/5ML syrup Take 5 mLs by mouth every 4 (four) hours as needed for cough. 118 mL 0   lidocaine  (LIDODERM ) 5 % Place 1 patch onto the skin daily. Remove & Discard patch within 12 hours or as directed by MD 30 patch 0   naloxone  (NARCAN ) nasal spray 4 mg/0.1 mL If concern for opioid overdose, spray in nostril. Call 911. 1 each 0   naphazoline-pheniramine (NAPHCON-A) 0.025-0.3 % ophthalmic solution Place 2 drops into both eyes 2 (two) times daily.     nitroGLYCERIN  (NITROSTAT ) 0.4 MG SL tablet Place 1 tablet (0.4 mg total) under the tongue every 5 (five) minutes as needed for chest pain. 30 tablet 0   oxyCODONE -acetaminophen  (PERCOCET/ROXICET) 5-325 MG tablet Take 1 tablet by mouth  every 8 (eight) hours as needed for severe pain (pain score 7-10). 10 tablet 0   pantoprazole  (PROTONIX ) 40 MG tablet Take 1 tablet (40 mg total) by mouth daily at 12 noon. (Patient taking differently: Take 40 mg by mouth at bedtime.) 30 tablet 0   promethazine -dextromethorphan (PROMETHAZINE -DM) 6.25-15 MG/5ML syrup Take 5 mLs by mouth 4 (four) times daily as needed for cough. 50 mL 0   triamcinolone  ointment (KENALOG ) 0.1 % 1 Application 2 (two) times daily as needed.     doxycycline  (VIBRAMYCIN ) 100 MG capsule Take 1 capsule (100 mg total) by mouth 2 (two) times daily. One po bid x 7 days (Patient not taking: Reported on 01/03/2024) 14 capsule 0     Physical Exam: Blood pressure (!) 124/55, pulse 72, temperature 98.4 F (36.9 C), resp. rate 18, height 5\' 4"  (1.626 m), weight 66.7 kg, SpO2 100%. General: somnolent black female, laying on hospital bed, appears stated age, NAD. HEENT: head -normocephalic, atraumatic; Eyes: PERRLA, no conjunctival injection; anicteric sclerae Neck- Trachea is midline  CV- RRR,  normal S1/S2, no M/R/G, no lower extremity edema Pulm- breathing is non-labored ORA  Abd- soft, non-distended, mild TTP right upper flank/right inferior costal margin. No guarding.  no masses, hernias, or organomegaly. GU- deferred  MSK- UE/LE symmetrical, LUE HD access Neuro- nonfocal Psych- Alert and Oriented x3 with appropriate affect Skin: warm and dry, no rashes or lesions   Results for orders placed or performed during the hospital encounter of 01/03/24 (from the past 48 hours)  Glucose, capillary     Status: None   Collection Time: 01/04/24 12:10 PM  Result Value Ref Range   Glucose-Capillary 87 70 - 99 mg/dL    Comment: Glucose reference range applies only to samples taken after fasting for at least 8 hours.  Hepatitis B surface antigen     Status: None   Collection Time: 01/04/24  2:01 PM  Result Value Ref Range   Hepatitis B Surface Ag NON REACTIVE NON REACTIVE    Comment: Performed at [facility], [address], [place], [place] [address]  Glucose, capillary     Status: Abnormal   Collection Time: 01/04/24  5:47 PM  Result Value Ref Range   Glucose-Capillary 65 (L) 70 - 99 mg/dL    Comment: Glucose reference range applies only to samples taken after fasting for at least 8 hours.  Glucose, capillary     Status: None   Collection Time: 01/04/24  6:22 PM  Result Value Ref Range   Glucose-Capillary 76 70 - 99 mg/dL    Comment: Glucose reference range applies only to samples taken after fasting for at least 8 hours.  Respiratory (~20 pathogens) panel by PCR     Status: None   Collection Time: 01/04/24 11:35 PM   Specimen: Nasopharyngeal Swab; Respiratory  Result Value Ref Range   Adenovirus NOT DETECTED NOT DETECTED   Coronavirus 229E NOT DETECTED NOT DETECTED    Comment: (NOTE) The Coronavirus on the Respiratory Panel, DOES NOT test for the novel  Coronavirus (2019 nCoV)    Coronavirus HKU1 NOT DETECTED NOT DETECTED   Coronavirus NL63 NOT DETECTED NOT DETECTED    Coronavirus OC43 NOT DETECTED NOT DETECTED   Metapneumovirus NOT DETECTED NOT DETECTED   Rhinovirus / Enterovirus NOT DETECTED NOT DETECTED   Influenza A NOT DETECTED NOT DETECTED   Influenza B NOT DETECTED NOT DETECTED   Parainfluenza Virus 1 NOT DETECTED NOT DETECTED   Parainfluenza Virus 2 NOT DETECTED  NOT DETECTED   Parainfluenza Virus 3 NOT DETECTED NOT DETECTED   Parainfluenza Virus 4 NOT DETECTED NOT DETECTED   Respiratory Syncytial Virus NOT DETECTED NOT DETECTED   Bordetella pertussis NOT DETECTED NOT DETECTED   Bordetella Parapertussis NOT DETECTED NOT DETECTED   Chlamydophila pneumoniae NOT DETECTED NOT DETECTED   Mycoplasma pneumoniae NOT DETECTED NOT DETECTED    Comment: Performed at [facility], [address] [place], [place] [address]  Comprehensive metabolic panel     Status: Abnormal   Collection Time: 01/05/24  2:27 PM  Result Value Ref Range   Sodium 134 (L) 135 - 145 mmol/L   Potassium 5.3 (H) 3.5 - 5.1 mmol/L   Chloride 100 98 - 111 mmol/L   CO2 20 (L) 22 - 32 mmol/L   Glucose, Bld 91 70 - 99 mg/dL    Comment: Glucose reference range applies only to samples taken after fasting for at least 8 hours.   BUN 41 (H) 8 - 23 mg/dL   Creatinine, Ser 6.04 (H) 0.44 - 1.00 mg/dL   Calcium  8.5 (L) 8.9 - 10.3 mg/dL   Total Protein 6.9 6.5 - 8.1 g/dL   Albumin  2.8 (L) 3.5 - 5.0 g/dL   AST 38 15 - 41 U/L   ALT 29 0 - 44 U/L   Alkaline Phosphatase 128 (H) 38 - 126 U/L   Total Bilirubin 0.6 0.0 - 1.2 mg/dL   GFR, Estimated 6 (L) >60 mL/min    Comment: (NOTE) Calculated using the CKD-EPI Creatinine Equation (2021)    Anion gap 14 5 - 15    Comment: Performed at [facility], [address], [place], [place] [address]  CBC     Status: Abnormal   Collection Time: 01/06/24  5:32 AM  Result Value Ref Range   WBC 8.2 4.0 - 10.5 K/uL   RBC 3.18 (L) 3.87 - 5.11 MIL/uL   Hemoglobin 9.2 (L) 12.0 - 15.0 g/dL   HCT 11.9 (L) 14.7 - 82.9 %   MCV 89.0 80.0 - 100.0  fL   MCH 28.9 26.0 - 34.0 pg   MCHC 32.5 30.0 - 36.0 g/dL   RDW 56.2 (H) 13.0 - 86.5 %   Platelets 134 (L) 150 - 400 K/uL   nRBC 0.0 0.0 - 0.2 %    Comment: Performed at [facility], [address] [place], [place] [address]  Basic metabolic panel with GFR     Status: Abnormal   Collection Time: 01/06/24  5:32 AM  Result Value Ref Range   Sodium 132 (L) 135 - 145 mmol/L   Potassium 3.8 3.5 - 5.1 mmol/L   Chloride 96 (L) 98 - 111 mmol/L   CO2 24 22 - 32 mmol/L   Glucose, Bld 94 70 - 99 mg/dL    Comment: Glucose reference range applies only to samples taken after fasting for at least 8 hours.   BUN 22 8 - 23 mg/dL   Creatinine, Ser 6.29 (H) 0.44 - 1.00 mg/dL   Calcium  8.1 (L) 8.9 - 10.3 mg/dL   GFR, Estimated 10 (L) >60 mL/min    Comment: (NOTE) Calculated using the CKD-EPI Creatinine Equation (2021)    Anion gap 12 5 - 15    Comment: Performed at [facility], [address] [place], [place] [address]   IR Removal Tun Cv Cath W/O FL Result Date: 01/06/2024 INDICATION: End-stage renal disease on hemodialysis. Candidemia. Request removal of tunneled HD catheter.  Catheter originally placed by surgical service 07/2023 EXAM: REMOVAL OF TUNNELED RIGHT IJ HEMODIALYSIS CATHETER MEDICATIONS: 1% plain lidocaine , 8 mL COMPLICATIONS: None immediate. PROCEDURE: Informed written consent was obtained from the patient following an explanation of the procedure, risks, benefits and alternatives to treatment. A time out was performed prior to the initiation of the procedure. Maximal barrier sterile technique was utilized including caps, mask, sterile gowns, sterile gloves, large sterile drape, hand hygiene, and chlorhexidine . 1% lidocaine  was injected under sterile conditions along the subcutaneous tunnel. Utilizing a combination of blunt dissection and gentle traction, the cuff of the catheter was exposed and the catheter was removed intact. Hemostasis was obtained with manual compression. A  dressing was placed. The patient tolerated the procedure well without immediate post procedural complication. IMPRESSION: Successful removal of tunneled right IJ hemodialysis catheter. Procedure performed by Prudence Brown PA-C supervised by Dr. Lowanda Ruddy Electronically Signed   By: Erica Hau M.D.   On: 01/06/2024 09:37   ECHOCARDIOGRAM COMPLETE Result Date: 01/05/2024    ECHOCARDIOGRAM REPORT   Patient Name:   MARSI TURVEY Date of Exam: 01/05/2024 Medical Rec #:  [id number]               Height:       64.0 in Accession #:    [id number]              Weight:       147.0 lb Date of Birth:  [birth year]               BSA:          1.716 m Patient Age:    63 years                BP:           142/86 mmHg Patient Gender: F                       HR:           66 bpm. Exam Location:  Inpatient Procedure: 2D Echo, Cardiac Doppler, Color Doppler and Intracardiac            Opacification Agent (Both Spectral and Color Flow Doppler were            utilized during procedure). Indications:    Endocarditis  History:        Patient has prior history of Echocardiogram examinations, most                 recent 08/02/2023. Risk Factors:Diabetes and Hypertension.  Sonographer:    Janette Medley Referring Phys: [id number] Alica Inks IMPRESSIONS  1. Left ventricular ejection fraction, by estimation, is 70 to 75%. The left ventricle has hyperdynamic function. The left ventricle has no regional wall motion abnormalities. There is mild concentric left ventricular hypertrophy. Left ventricular diastolic parameters are indeterminate.  2. Right ventricular systolic function is normal. The right ventricular size is normal.  3. Left atrial size was mildly dilated.  4. Right atrial size was mildly dilated.  5. The mitral valve is abnormal. Mild mitral valve regurgitation. No evidence of mitral stenosis.  6. The aortic valve is normal in structure. Aortic valve regurgitation is not visualized. No aortic stenosis is  present.  7. The inferior vena cava is dilated in size with >50% respiratory variability, suggesting right atrial pressure of 8 mmHg. Conclusion(s)/Recommendation(s): No evidence of valvular vegetations on this transthoracic echocardiogram. Consider a transesophageal echocardiogram to exclude infective  endocarditis if clinically indicated. FINDINGS  Left Ventricle: Left ventricular ejection fraction, by estimation, is 70 to 75%. The left ventricle has hyperdynamic function. The left ventricle has no regional wall motion abnormalities. The left ventricular internal cavity size was normal in size. There is mild concentric left ventricular hypertrophy. Left ventricular diastolic parameters are indeterminate. Right Ventricle: The right ventricular size is normal. No increase in right ventricular wall thickness. Right ventricular systolic function is normal. Left Atrium: Left atrial size was mildly dilated. Right Atrium: Right atrial size was mildly dilated. Pericardium: There is no evidence of pericardial effusion. Mitral Valve: The mitral valve is abnormal. There is mild calcification of the mitral valve leaflet(s). Mild mitral annular calcification. Mild mitral valve regurgitation. No evidence of mitral valve stenosis. Tricuspid Valve: The tricuspid valve is normal in structure. Tricuspid valve regurgitation is trivial. No evidence of tricuspid stenosis. Aortic Valve: The aortic valve is normal in structure. Aortic valve regurgitation is not visualized. No aortic stenosis is present. Pulmonic Valve: The pulmonic valve was normal in structure. Pulmonic valve regurgitation is not visualized. No evidence of pulmonic stenosis. Aorta: The aortic root is normal in size and structure. Venous: The inferior vena cava is dilated in size with greater than 50% respiratory variability, suggesting right atrial pressure of 8 mmHg. IAS/Shunts: No atrial level shunt detected by color flow Doppler.  LEFT VENTRICLE PLAX 2D LVIDd:          4.30 cm   Diastology LVIDs:         2.80 cm   LV e' medial:    8.59 cm/s LV PW:         1.00 cm   LV E/e' medial:  16.8 LV IVS:        1.10 cm   LV e' lateral:   8.38 cm/s LVOT diam:     1.80 cm   LV E/e' lateral: 17.2 LV SV:         83 LV SV Index:   48 LVOT Area:     2.54 cm  RIGHT VENTRICLE             IVC RV S prime:     16.10 cm/s  IVC diam: 1.80 cm TAPSE (M-mode): 2.2 cm LEFT ATRIUM             Index        RIGHT ATRIUM           Index LA diam:        4.50 cm 2.62 cm/m   RA Area:     17.20 cm LA Vol (A2C):   61.8 ml 36.01 ml/m  RA Volume:   51.20 ml  29.83 ml/m LA Vol (A4C):   54.8 ml 31.93 ml/m LA Biplane Vol: 59.7 ml 34.78 ml/m  AORTIC VALVE LVOT Vmax:   150.00 cm/s LVOT Vmean:  103.000 cm/s LVOT VTI:    0.325 m  AORTA Ao Root diam: 2.60 cm Ao Asc diam:  3.10 cm MITRAL VALVE MV Area (PHT): 4.39 cm     SHUNTS MV Decel Time: 173 msec     Systemic VTI:  0.32 m MV E velocity: 144.00 cm/s  Systemic Diam: 1.80 cm MV A velocity: 72.80 cm/s MV E/A ratio:  1.98 Jules Oar MD Electronically signed by Jules Oar MD Signature Date/Time: 01/05/2024/3:51:08 PM    Final    US  Abdomen Limited RUQ (LIVER/GB) Result Date: 01/04/2024 CLINICAL DATA:  Abnormal CT EXAM: ULTRASOUND ABDOMEN LIMITED RIGHT UPPER QUADRANT COMPARISON:  CT abdomen and pelvis 01/03/2024 FINDINGS: Gallbladder: Gallstones are present measuring up to 1.6 cm. Pericholecystic fluid is present. The gallbladder wall thickened measuring 10 mm. Sonographic Abigail Abler sign is negative per sonographer. Common bile duct: Diameter: 5.5 mm. Liver: No focal lesion identified. There is lobulated liver contour. Within normal limits in parenchymal echogenicity. Portal vein is patent on color Doppler imaging with normal direction of blood flow towards the liver. Other: None. IMPRESSION: 1. Cholelithiasis with gallbladder wall thickening and pericholecystic fluid. Findings are concerning for acute cholecystitis. 2. Lobulated liver contour, which can be  seen in the setting of cirrhosis. Electronically Signed   By: Tyron Gallon M.D.   On: 01/04/2024 22:28      Assessment/Plan Cholelithiasis 63 y/o F admitted with fever, candida bacteremia with imaging studies concerning for inflammatory changes around the gallbladder. WBC 8.2 today. LFTs overall normal, alk phos 128. She got HD this AM and vascular is planning to remove her HD access today for a line holiday. The patient does have large gallstones. Her history is not consistent with biliary colic/cholecystitis but she does have some right sided flank/costal margin tenderness on exam, so cholecystitis cannot be completely excluded. We will follow clinically but she may end up needing cholecystectomy vs cholecystostomy this admission if her pain persists. Continue IV abx.  Imaging shows a nodular liver contour. I do not see any documented history of a known history of cirrhosis. Will need to discuss with the patient and medical team further.   FEN - NPO, ok for CLD today from [facility] standpoint VTE - SCD's, SQH ID - Rocephin , Flagyl , micafungin  Admit - TRH service   ESRD on HD MWF HTN GERD Anemia Thyroid  nodule   I reviewed nursing notes, Consultant vascular, ID notes, hospitalist notes, last 24 h vitals and pain scores, last 48 h intake and output, last 24 h labs and trends, and last 24 h imaging results.  Charlott Converse, [facility] Surgery 01/06/2024, 10:19 AM Please see Amion for pager number during day hours 7:00am-4:30pm or 7:00am -11:30am on weekends

## 2024-01-06 NOTE — Progress Notes (Addendum)
 [facility] Progress Note   Subjective:   Cultures showing candidemia. Pt is feeling better today, denies SOB, CP, dizziness. Reports some chronic diarrhea. She had her [facility] catheter removed per ID recommendations. Appreciate their input in this matter.  Per notes, she need second stage AVF surgery before it will be usable. Per ID, they would prefer that the patient have negative BC results until another [facility] is placed. We will plan for temp HD placement on 01/07/24. Last HD 01/05/24  Today she denies any CP but reports some dyspnea and abdominal pain. She was having some loose stools prior to admission. Surgical has been consulted for possible cholecystitis.   Objective Vitals:   01/06/24 0244 01/06/24 0333 01/06/24 0536 01/06/24 0819  BP: (!) 217/92 (!) 188/69 (!) 142/63 (!) 124/55  Pulse: (!) 112 98 92 72  Resp: (!) 31 16 18 18   Temp: 98.4 F (36.9 C)  (!) 102.2 F (39 C) 98.4 F (36.9 C)  TempSrc: Oral  Oral   SpO2: 91% 94% 94% 100%  Weight:      Height:       Physical Exam General: Alert female in NAD Heart: RRR, no murmrus, rubs or gallops Lungs: CTA bilaterally, respirations unlabored Abdomen: Soft, non-distended, +BS  Extremities: No edema b/l lower extremities Dialysis Access:  TDC removed 01/06/24, AVF + t/b but somewhat small, needs 2nd stage surgery for AVF. Plan for temp catheter before next HD.   Additional Objective Labs: Basic Metabolic Panel: Recent Labs  Lab 01/04/24 0529 01/05/24 1427 01/06/24 0532  NA 136 134* 132*  K 4.4 5.3* 3.8  CL 101 100 96*  CO2 23 20* 24  GLUCOSE 93 91 94  BUN 26* 41* 22  CREATININE 5.97* 7.51* 4.56*  CALCIUM  8.1* 8.5* 8.1*   Liver Function Tests: Recent Labs  Lab 01/04/24 0529 01/05/24 1427  AST 112* 38  ALT 44 29  ALKPHOS 124 128*  BILITOT 1.0 0.6  PROT 6.3* 6.9  ALBUMIN  2.5* 2.8*   No results for input(s): "LIPASE", "AMYLASE" in the last 168 hours. CBC: Recent Labs  Lab 01/03/24 1407  01/04/24 0702 01/06/24 0532  WBC 1.3*  1.2* 15.0* 8.2  NEUTROABS 1.0*  --   --   HGB 10.5*  10.3* 9.0* 9.2*  HCT 32.5*  32.9* 27.9* 28.3*  MCV 91.5  91.6 92.1 89.0  PLT 162  154 110* 134*   Blood Culture    Component Value Date/Time   SDES BLOOD RIGHT ARM 01/04/2024 0539   SPECREQUEST  01/04/2024 0539    BOTTLES DRAWN AEROBIC AND ANAEROBIC Blood Culture adequate volume   CULT YEAST 01/04/2024 0539   REPTSTATUS PENDING 01/04/2024 0539    Cardiac Enzymes: No results for input(s): "CKTOTAL", "CKMB", "CKMBINDEX", "TROPONINI" in the last 168 hours. CBG: Recent Labs  Lab 01/04/24 0823 01/04/24 1210 01/04/24 1747 01/04/24 1822  GLUCAP 71 87 65* 76   Iron  Studies: No results for input(s): "IRON ", "TIBC", "TRANSFERRIN", "FERRITIN" in the last 72 hours. @lablastinr3 @ Studies/Results: IR Removal Tun Cv Cath W/O FL Result Date: 01/06/2024 INDICATION: End-stage renal disease on hemodialysis. Candidemia. Request removal of tunneled HD catheter. Catheter originally placed by surgical service 07/2023 EXAM: REMOVAL OF TUNNELED RIGHT IJ HEMODIALYSIS CATHETER MEDICATIONS: 1% plain lidocaine , 8 mL COMPLICATIONS: None immediate. PROCEDURE: Informed written consent was obtained from the patient following an explanation of the procedure, risks, benefits and alternatives to treatment. A time out was performed prior to the initiation of the procedure. Maximal barrier sterile technique  was utilized including caps, mask, sterile gowns, sterile gloves, large sterile drape, hand hygiene, and chlorhexidine . 1% lidocaine  was injected under sterile conditions along the subcutaneous tunnel. Utilizing a combination of blunt dissection and gentle traction, the cuff of the catheter was exposed and the catheter was removed intact. Hemostasis was obtained with manual compression. A dressing was placed. The patient tolerated the procedure well without immediate post procedural complication. IMPRESSION: Successful  removal of tunneled right IJ hemodialysis catheter. Procedure performed by Prudence Brown PA-C supervised by Dr. Lowanda Ruddy Electronically Signed   By: Erica Hau M.D.   On: 01/06/2024 09:37   ECHOCARDIOGRAM COMPLETE Result Date: 01/05/2024    ECHOCARDIOGRAM REPORT   Patient Name:   TANISHI NAULT Date of Exam: 01/05/2024 Medical Rec #:  [id number]               Height:       64.0 in Accession #:    [id number]              Weight:       147.0 lb Date of Birth:  12/28/1960               BSA:          1.716 m Patient Age:    63 years                BP:           142/86 mmHg Patient Gender: F                       HR:           66 bpm. Exam Location:  Inpatient Procedure: 2D Echo, Cardiac Doppler, Color Doppler and Intracardiac            Opacification Agent (Both Spectral and Color Flow Doppler were            utilized during procedure). Indications:    Endocarditis  History:        Patient has prior history of Echocardiogram examinations, most                 recent 08/02/2023. Risk Factors:Diabetes and Hypertension.  Sonographer:    Janette Medley Referring Phys: [id number] Alica Inks IMPRESSIONS  1. Left ventricular ejection fraction, by estimation, is 70 to 75%. The left ventricle has hyperdynamic function. The left ventricle has no regional wall motion abnormalities. There is mild concentric left ventricular hypertrophy. Left ventricular diastolic parameters are indeterminate.  2. Right ventricular systolic function is normal. The right ventricular size is normal.  3. Left atrial size was mildly dilated.  4. Right atrial size was mildly dilated.  5. The mitral valve is abnormal. Mild mitral valve regurgitation. No evidence of mitral stenosis.  6. The aortic valve is normal in structure. Aortic valve regurgitation is not visualized. No aortic stenosis is present.  7. The inferior vena cava is dilated in size with >50% respiratory variability, suggesting right atrial pressure of 8 mmHg.  Conclusion(s)/Recommendation(s): No evidence of valvular vegetations on this transthoracic echocardiogram. Consider a transesophageal echocardiogram to exclude infective endocarditis if clinically indicated. FINDINGS  Left Ventricle: Left ventricular ejection fraction, by estimation, is 70 to 75%. The left ventricle has hyperdynamic function. The left ventricle has no regional wall motion abnormalities. The left ventricular internal cavity size was normal in size. There is mild concentric left ventricular hypertrophy. Left ventricular diastolic parameters are indeterminate. Right Ventricle: The right ventricular  size is normal. No increase in right ventricular wall thickness. Right ventricular systolic function is normal. Left Atrium: Left atrial size was mildly dilated. Right Atrium: Right atrial size was mildly dilated. Pericardium: There is no evidence of pericardial effusion. Mitral Valve: The mitral valve is abnormal. There is mild calcification of the mitral valve leaflet(s). Mild mitral annular calcification. Mild mitral valve regurgitation. No evidence of mitral valve stenosis. Tricuspid Valve: The tricuspid valve is normal in structure. Tricuspid valve regurgitation is trivial. No evidence of tricuspid stenosis. Aortic Valve: The aortic valve is normal in structure. Aortic valve regurgitation is not visualized. No aortic stenosis is present. Pulmonic Valve: The pulmonic valve was normal in structure. Pulmonic valve regurgitation is not visualized. No evidence of pulmonic stenosis. Aorta: The aortic root is normal in size and structure. Venous: The inferior vena cava is dilated in size with greater than 50% respiratory variability, suggesting right atrial pressure of 8 mmHg. IAS/Shunts: No atrial level shunt detected by color flow Doppler.  LEFT VENTRICLE PLAX 2D LVIDd:         4.30 cm   Diastology LVIDs:         2.80 cm   LV e' medial:    8.59 cm/s LV PW:         1.00 cm   LV E/e' medial:  16.8 LV IVS:         1.10 cm   LV e' lateral:   8.38 cm/s LVOT diam:     1.80 cm   LV E/e' lateral: 17.2 LV SV:         83 LV SV Index:   48 LVOT Area:     2.54 cm  RIGHT VENTRICLE             IVC RV S prime:     16.10 cm/s  IVC diam: 1.80 cm TAPSE (M-mode): 2.2 cm LEFT ATRIUM             Index        RIGHT ATRIUM           Index LA diam:        4.50 cm 2.62 cm/m   RA Area:     17.20 cm LA Vol (A2C):   61.8 ml 36.01 ml/m  RA Volume:   51.20 ml  29.83 ml/m LA Vol (A4C):   54.8 ml 31.93 ml/m LA Biplane Vol: 59.7 ml 34.78 ml/m  AORTIC VALVE LVOT Vmax:   150.00 cm/s LVOT Vmean:  103.000 cm/s LVOT VTI:    0.325 m  AORTA Ao Root diam: 2.60 cm Ao Asc diam:  3.10 cm MITRAL VALVE MV Area (PHT): 4.39 cm     SHUNTS MV Decel Time: 173 msec     Systemic VTI:  0.32 m MV E velocity: 144.00 cm/s  Systemic Diam: 1.80 cm MV A velocity: 72.80 cm/s MV E/A ratio:  1.98 Jules Oar MD Electronically signed by Jules Oar MD Signature Date/Time: 01/05/2024/3:51:08 PM    Final    US  Abdomen Limited RUQ (LIVER/GB) Result Date: 01/04/2024 CLINICAL DATA:  Abnormal CT EXAM: ULTRASOUND ABDOMEN LIMITED RIGHT UPPER QUADRANT COMPARISON:  CT abdomen and pelvis 01/03/2024 FINDINGS: Gallbladder: Gallstones are present measuring up to 1.6 cm. Pericholecystic fluid is present. The gallbladder wall thickened measuring 10 mm. Sonographic Abigail Abler sign is negative per sonographer. Common bile duct: Diameter: 5.5 mm. Liver: No focal lesion identified. There is lobulated liver contour. Within normal limits in parenchymal echogenicity. Portal vein is patent on color  Doppler imaging with normal direction of blood flow towards the liver. Other: None. IMPRESSION: 1. Cholelithiasis with gallbladder wall thickening and pericholecystic fluid. Findings are concerning for acute cholecystitis. 2. Lobulated liver contour, which can be seen in the setting of cirrhosis. Electronically Signed   By: Tyron Gallon M.D.   On: 01/04/2024 22:28   Medications:  cefTRIAXone   (ROCEPHIN )  IV 2 g (01/06/24 1041)   metronidazole  500 mg (01/06/24 0437)   micafungin  (MYCAMINE ) 100 mg in sodium chloride  0.9 % 100 mL IVPB 100 mg (01/05/24 2041)    amLODipine   10 mg Oral Daily   aspirin  EC  81 mg Oral Daily   atorvastatin   80 mg Oral Daily   Chlorhexidine  Gluconate Cloth  6 each Topical Q0600   gabapentin   100 mg Oral BID   heparin   5,000 Units Subcutaneous Q8H   pantoprazole   40 mg Oral QHS    Dialysis Orders  [facility] M[facility]  4h   B500   70.5kg    TDC    Heparin  2000 Has been coming off 2-4kg over last 2 wks Had 30 min Rx on 5/16    Assessment/Plan: Fever / leukopenia: blood cultures showing candidemia. ID on board. Needs line holiday. Had HD overnight and her [facility] was removed this am. Discussed case with IR PA.  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 On Flagyl  500 mg q 12 hours and Mycamine  100 mg q 24 hours.  ESRD: on HD MWF. Had full HD Wed, minimal dialysis on Friday. Had HD overnight with 3L UF. Plan to place temp cath on 01/07/24, and once she has negative BC, we can place [facility] and remove temp cath. 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 Next dose due 5/19. Holding IV Fe with infection  Possible cholecystitis: surgery consulted 01/06/24. CT of the chest/abdomen/pelvis shows atypical inflammatory changes of her lungs as well as gallstones with trace pericholecystic fluid. RUQ U/S shows stones, wall thickening, and pericholecystic fluid. Both of this studies also show a nodular liver contour concerning for possible cirrhosis  Secondary hyperparathyroidism: CCa 9.6, check phos with next labs.  Nutrition: albumin  2.8 on renal diet. Started supplements.   Carol Lorenzo, NP-C 01/06/2024, 2:04 PM  [facility]   Seen and examined independently.  Agree with note and  exam as documented above by Carol Lorenzo, NP and as noted here.  ESRD - on HD MWF usually.  Had HD on 5/18.    Fever with candidemia - catheter was removed today for line holiday.  (By IR this AM).  Abx and anti-infectives per ID.  Plan for nontunneled catheter on 5/20 or 5/21 with IR.  Plan for 5/21 if possible.  Then will need tunneled catheter once cultures are definitively negative  HTN with CKD - acceptable   Anemia of CKD - on ESA   Carol Aver, MD 01/06/2024  4:59 PM

## 2024-01-06 NOTE — Progress Notes (Signed)
 Pt receives out-pt HD at [facility] on MWF 12:20 pm chair time. Will assist as needed.  Lauraine Polite Renal Navigator [phone]

## 2024-01-06 NOTE — Progress Notes (Signed)
 Received patient in bed to unit.  Alert and oriented.  Informed consent signed and in chart.   TX duration:  Patient tolerated well.  Transported back to the room  Alert, without acute distress.  Hand-off given to patient's nurse.   Access used: catheter Access issues: none  Total UF removed: 3000 ml Medication(s) given: none Post HD VS: 217/92 Post HD weight: not obtained   01/06/24 0244  Vitals  Temp 98.4 F (36.9 C)  Temp Source Oral  BP (!) 217/92  MAP (mmHg) 124  BP Location Right Arm  BP Method Automatic  Patient Position (if appropriate) Lying  Pulse Rate (!) 112  Pulse Rate Source Monitor  ECG Heart Rate (!) 109  Resp (!) 31  Oxygen Therapy  SpO2 91 %  O2 Device Nasal Cannula  O2 Flow Rate (L/min) 3 L/min  Patient Activity (if Appropriate) In bed  Pulse Oximetry Type Intermittent  Oximetry Probe Site Changed No  During Treatment Monitoring  Blood Flow Rate (mL/min) 0 mL/min  Arterial Pressure (mmHg) 2.63 mmHg  Venous Pressure (mmHg) -2.42 mmHg  TMP (mmHg) 19.59 mmHg  Ultrafiltration Rate (mL/min) 1106 mL/min  Dialysate Flow Rate (mL/min) 299 ml/min  Duration of HD Treatment -hour(s) 3 hour(s)  Cumulative Fluid Removed (mL) per Treatment  3000.2  Post Treatment  Dialyzer Clearance Lightly streaked  Hemodialysis Intake (mL) 0 mL  Liters Processed 72  Fluid Removed (mL) 3000 mL  Tolerated HD Treatment Yes  Post-Hemodialysis Comments HD tx completed as expected, tolerated, pt is stable.  AVG/AVF Arterial Site Held (minutes) 0 minutes  AVG/AVF Venous Site Held (minutes) 0 minutes  Note  Patient Observations pt is in bed resting, eyes closed.  Hemodialysis Catheter Right Internal jugular Double lumen Permanent (Tunneled)  Placement Date/Time: 08/01/23 1030   Placed prior to admission: No  Serial / Lot #: [id number]  Expiration Date: 12/18/27  Time Out: Correct site;Correct patient;Correct procedure  Maximum sterile barrier precautions: Hand  hygiene;Cap;Large sterile she...  Site Condition No complications  Blue Lumen Status Flushed;Heparin  locked;Dead end cap in place  Red Lumen Status Flushed;Heparin  locked;Dead end cap in place  Purple Lumen Status N/A  Catheter fill solution Heparin  1000 units/ml  Catheter fill volume (Arterial) 1.6 cc  Catheter fill volume (Venous) 1.6  Dressing Type Gauze/Drain sponge  Dressing Status Clean, Dry, Intact  Drainage Description None  Post treatment catheter status Capped and Clamped       [facility]

## 2024-01-07 DIAGNOSIS — B377 Candidal sepsis: Secondary | ICD-10-CM | POA: Diagnosis not present

## 2024-01-07 DIAGNOSIS — A419 Sepsis, unspecified organism: Secondary | ICD-10-CM | POA: Diagnosis not present

## 2024-01-07 DIAGNOSIS — K8018 Calculus of gallbladder with other cholecystitis without obstruction: Secondary | ICD-10-CM | POA: Diagnosis not present

## 2024-01-07 DIAGNOSIS — I160 Hypertensive urgency: Secondary | ICD-10-CM | POA: Diagnosis not present

## 2024-01-07 LAB — CBC
HCT: 25.7 % — ABNORMAL LOW (ref 36.0–46.0)
Hemoglobin: 8.5 g/dL — ABNORMAL LOW (ref 12.0–15.0)
MCH: 29.4 pg (ref 26.0–34.0)
MCHC: 33.1 g/dL (ref 30.0–36.0)
MCV: 88.9 fL (ref 80.0–100.0)
Platelets: 119 10*3/uL — ABNORMAL LOW (ref 150–400)
RBC: 2.89 MIL/uL — ABNORMAL LOW (ref 3.87–5.11)
RDW: 16.8 % — ABNORMAL HIGH (ref 11.5–15.5)
WBC: 7.6 10*3/uL (ref 4.0–10.5)
nRBC: 0 % (ref 0.0–0.2)

## 2024-01-07 LAB — BASIC METABOLIC PANEL WITH GFR
Anion gap: 13 (ref 5–15)
BUN: 35 mg/dL — ABNORMAL HIGH (ref 8–23)
CO2: 23 mmol/L (ref 22–32)
Calcium: 8.2 mg/dL — ABNORMAL LOW (ref 8.9–10.3)
Chloride: 98 mmol/L (ref 98–111)
Creatinine, Ser: 6.05 mg/dL — ABNORMAL HIGH (ref 0.44–1.00)
GFR, Estimated: 7 mL/min — ABNORMAL LOW (ref >60)
Glucose, Bld: 77 mg/dL (ref 70–99)
Potassium: 4.1 mmol/L (ref 3.5–5.1)
Sodium: 134 mmol/L — ABNORMAL LOW (ref 135–145)

## 2024-01-07 LAB — CULTURE, BLOOD (ROUTINE X 2): Special Requests: ADEQUATE

## 2024-01-07 LAB — HEPATITIS B SURFACE ANTIBODY, QUANTITATIVE: Hep B S AB Quant (Post): 80.7 m[IU]/mL

## 2024-01-07 MED ORDER — PROSOURCE PLUS PO LIQD
ORAL | Status: AC
  Filled 2024-01-07: qty 30

## 2024-01-07 MED ORDER — CHLORHEXIDINE GLUCONATE CLOTH 2 % EX PADS
6.0000 | MEDICATED_PAD | Freq: Every day | CUTANEOUS | Status: DC
  Administered 2024-01-08 – 2024-01-10 (×3): 6 via TOPICAL

## 2024-01-07 MED ORDER — METRONIDAZOLE 500 MG PO TABS
500.0000 mg | ORAL_TABLET | Freq: Two times a day (BID) | ORAL | Status: DC
  Administered 2024-01-07 – 2024-01-09 (×6): 500 mg via ORAL
  Filled 2024-01-07 (×6): qty 1

## 2024-01-07 MED ORDER — DARBEPOETIN ALFA 60 MCG/0.3ML IJ SOSY
60.0000 ug | PREFILLED_SYRINGE | INTRAMUSCULAR | Status: DC
Start: 2024-01-08 — End: 2024-01-09
  Administered 2024-01-08: 60 ug via SUBCUTANEOUS
  Filled 2024-01-07: qty 0.3

## 2024-01-07 MED ORDER — VANCOMYCIN HCL IN DEXTROSE 1-5 GM/200ML-% IV SOLN
1000.0000 mg | Freq: Once | INTRAVENOUS | Status: AC
  Administered 2024-01-07: 1000 mg via INTRAVENOUS
  Filled 2024-01-07: qty 200

## 2024-01-07 NOTE — Progress Notes (Signed)
   [facility] has been requested to perform a transesophageal echocardiogram on Carol [name] for fungemia.     Relative Contraindications: History of dysphagia, no further dysphagia now per patient, EGD from 11/06/2022 showed no EGD abnormalities of esophagus, lower third esophagus and at GE junction was dilated. No varices mentioned.   The patient has: No other conditions that may impact this procedure.    After careful review of history and examination, the risks and benefits of transesophageal echocardiogram have been explained including risks of esophageal damage, perforation (1:10,000 risk), bleeding, pharyngeal hematoma as well as other potential complications associated with conscious sedation including aspiration, arrhythmia, respiratory failure and death. Alternatives to treatment were discussed, questions were answered. Patient is willing to proceed.   TEE scheduled on Thursday 01/09/24. NPO after midnight on Wednesday. Orders placed. Check INR.    Signed, Dellis Fermo, NP  01/07/2024 2:11 PM

## 2024-01-07 NOTE — Progress Notes (Signed)
 Progress Note   Patient: Carol [name] ZHY:[id number] DOB: 1961-04-22 DOA: 01/03/2024     4 DOS: the patient was seen and examined on 01/07/2024   Brief hospital course: Carol [name] is a 62 y.o. female with medical history significant for ESRD HD MWF reports that she has been having subjective fevers and shaking chills for the last 2 to 3 days at home.  C/o mild shortness of breath no cough, diarrhea today at dialysis she says she just got much worse all of a sudden.  Found to have fever in ED, leukopenia, started on Rocephin  and Zithromax  admitted for fever and early sepsis.   ID on board HD catheter removed. CT abd, RUQ sono ? Cholecystitis, surgery team following. Nephrology on board, plan to place temporary HD access tomorrow.   Assessment and Plan: Sepsis unknown origin Candida tropicalis fungemia- Patient spiked fever. Tmax 102.2  5/19. Blood cultures positive for candida tropicalis.  Continue Micafungin  therapy.  ID follow up appreciated. HD cath removed 5/19. 2d echo no vegetation. Will Repeat blood cultures 5/19 pending. TEE planned for 5/22. Last HD 01/05/24, plan to place temp dialysis catheter tomorrow for HD Continue to follow vitals closely. Follow repeat blood cultures.  Cholelithiasis with possible cholecystitis: Seen on RUQ sono. Alk phos high.  Continue metronidazole  and Rocephin  therapy. Surgery follow up appreciated, holding lap chole for now pending ID work up.  ESRD on hemodialysis: Nephrology evaluation appreciated. She is on Monday Wednesday Friday schedule. Got HD on 5/18. Temporary catheter placement tomorrow for HD. Tunneled catheter once final cultures negative.  Hypertensive urgency- Blood pressure elevated. Continue Norvasc  10mg  daily. IV hydralazine  PRN ordered.  Anemia of chronic disease: Hemoglobin stable around 8-9. No active bleeding.  Abnormal thyroid  nodule found on CT scan: Thyroid  ultrasound showed multiple nodule,  one measuring 3.6 cm need FNA which can be arranged as outpatient.  TSH, free T4 normal.      Out of bed to chair. Incentive spirometry. Nursing supportive care. Fall, aspiration precautions. Diet:  Diet Orders (From admission, onward)     Start     Ordered   01/08/24 0001  Diet NPO time specified Except for: Sips with Meds  Diet effective midnight       Comments: For IR procedure tentatively scheduled for 5.21.25  Question:  Except for  Answer:  Sips with Meds   01/07/24 2952   01/06/24 1124  Diet renal with fluid restriction Fluid restriction: 1200 mL Fluid; Room service appropriate? Yes; Fluid consistency: Thin  Diet effective now       Question Answer Comment  Fluid restriction: 1200 mL Fluid   Room service appropriate? Yes   Fluid consistency: Thin      01/06/24 1123           DVT prophylaxis: heparin  injection 5,000 Units Start: 01/03/24 2200  Level of care: Telemetry Medical   Code Status: Full Code  Subjective: Patient is seen and examined today morning. On and off feels chills. Tmax 102.2. able to get out of bed. Eating fair. Upon asking admits RUQ mild pain.  Physical Exam: Vitals:   01/07/24 0235 01/07/24 0506 01/07/24 0700 01/07/24 0810  BP: (!) 144/69 137/69 (!) 149/78 (!) 149/78  Pulse:  67 67 67  Resp: 16 18 18    Temp: 98.2 F (36.8 C) 98.4 F (36.9 C) 98.2 F (36.8 C) 98.2 F (36.8 C)  TempSrc: Oral Oral Oral   SpO2: 95% 95% 93% 93%  Weight:  Height:        General - Elderly African American female, no apparent distress HEENT - PERRLA, EOMI, atraumatic head, non tender sinuses. Lung - Clear, basal rhonchi, no wheezes. Heart - S1, S2 heard, no murmurs, rubs, trace pedal edema. Abdomen - Soft, RUQ mild tender, bowel sounds good Neuro - Alert, awake and oriented x 3, non focal exam. Skin - Warm and dry.  Data Reviewed:      Latest Ref Rng & Units 01/07/2024    7:05 AM 01/06/2024    5:32 AM 01/04/2024    7:02 AM  CBC  WBC 4.0 - 10.5  K/uL 7.6  8.2  15.0   Hemoglobin 12.0 - 15.0 g/dL 8.5  9.2  9.0   Hematocrit 36.0 - 46.0 % 25.7  28.3  27.9   Platelets 150 - 400 K/uL 119  134  110       Latest Ref Rng & Units 01/07/2024    7:05 AM 01/06/2024    5:32 AM 01/05/2024    2:27 PM  BMP  Glucose 70 - 99 mg/dL 77  94  91   BUN 8 - 23 mg/dL 35  22  41   Creatinine 0.44 - 1.00 mg/dL 1.61  0.96  0.45   Sodium 135 - 145 mmol/L 134  132  134   Potassium 3.5 - 5.1 mmol/L 4.1  3.8  5.3   Chloride 98 - 111 mmol/L 98  96  100   CO2 22 - 32 mmol/L 23  24  20    Calcium  8.9 - 10.3 mg/dL 8.2  8.1  8.5    IR Removal Tun Cv Cath W/O FL Result Date: 01/06/2024 INDICATION: End-stage renal disease on hemodialysis. Candidemia. Request removal of tunneled HD catheter. Catheter originally placed by surgical service 07/2023 EXAM: REMOVAL OF TUNNELED RIGHT IJ HEMODIALYSIS CATHETER MEDICATIONS: 1% plain lidocaine , 8 mL COMPLICATIONS: None immediate. PROCEDURE: Informed written consent was obtained from the patient following an explanation of the procedure, risks, benefits and alternatives to treatment. A time out was performed prior to the initiation of the procedure. Maximal barrier sterile technique was utilized including caps, mask, sterile gowns, sterile gloves, large sterile drape, hand hygiene, and chlorhexidine . 1% lidocaine  was injected under sterile conditions along the subcutaneous tunnel. Utilizing a combination of blunt dissection and gentle traction, the cuff of the catheter was exposed and the catheter was removed intact. Hemostasis was obtained with manual compression. A dressing was placed. The patient tolerated the procedure well without immediate post procedural complication. IMPRESSION: Successful removal of tunneled right IJ hemodialysis catheter. Procedure performed by Carol Brown PA-C supervised by Dr. Lowanda Ruddy Electronically Signed   By: Erica Hau M.D.   On: 01/06/2024 09:37    Family Communication: Discussed with  patient, understand and agree. All questions answered.  Disposition: Status is: Inpatient Remains inpatient appropriate because: sepsis, antifungal, antibiotics, follow blood cultures, surgery follow up  Planned Discharge Destination: Home with Home Health     MDM level 3- she is ESRD patient with sepsis, fungemia, spiking fever, on antifungals, HD cath removed, ?cholecystitis on rocephin , flagyl , surgery on board. TEE 5/22. She is at high risk for sudden clinical deterioration.  Author: Aisha Hove, MD 01/07/2024 4:26 PM Secure chat 7am to 7pm For on call review [email or web address].

## 2024-01-07 NOTE — Progress Notes (Signed)
 Subjective/Chief Complaint: Still having some pain in the RUQ   Objective: Vital signs in last 24 hours: Temp:  [98.2 F (36.8 C)-98.5 F (36.9 C)] 98.4 F (36.9 C) (05/20 0506) Pulse Rate:  [67-72] 67 (05/20 0506) Resp:  [16-18] 18 (05/20 0506) BP: (124-150)/(55-73) 137/69 (05/20 0506) SpO2:  [95 %-100 %] 95 % (05/20 0506) Last BM Date : 01/05/24  Intake/Output from previous day: 05/19 0701 - 05/20 0700 In: 354 [P.O.:354] Out: 300 [Urine:300] Intake/Output this shift: No intake/output data recorded.  Exam: Awake and alert Comfortable Abdomen soft, tenderness with guarding in the RUQ  Lab Results:  Recent Labs    01/06/24 0532  WBC 8.2  HGB 9.2*  HCT 28.3*  PLT 134*   BMET Recent Labs    01/05/24 1427 01/06/24 0532  NA 134* 132*  K 5.3* 3.8  CL 100 96*  CO2 20* 24  GLUCOSE 91 94  BUN 41* 22  CREATININE 7.51* 4.56*  CALCIUM  8.5* 8.1*   PT/INR No results for input(s): "LABPROT", "INR" in the last 72 hours. ABG No results for input(s): "PHART", "HCO3" in the last 72 hours.  Invalid input(s): "PCO2", "PO2"  Studies/Results: IR Removal Tun Cv Cath W/O FL Result Date: 01/06/2024 INDICATION: End-stage renal disease on hemodialysis. Candidemia. Request removal of tunneled HD catheter. Catheter originally placed by surgical service 07/2023 EXAM: REMOVAL OF TUNNELED RIGHT IJ HEMODIALYSIS CATHETER MEDICATIONS: 1% plain lidocaine , 8 mL COMPLICATIONS: None immediate. PROCEDURE: Informed written consent was obtained from the patient following an explanation of the procedure, risks, benefits and alternatives to treatment. A time out was performed prior to the initiation of the procedure. Maximal barrier sterile technique was utilized including caps, mask, sterile gowns, sterile gloves, large sterile drape, hand hygiene, and chlorhexidine . 1% lidocaine  was injected under sterile conditions along the subcutaneous tunnel. Utilizing a combination of blunt dissection and  gentle traction, the cuff of the catheter was exposed and the catheter was removed intact. Hemostasis was obtained with manual compression. A dressing was placed. The patient tolerated the procedure well without immediate post procedural complication. IMPRESSION: Successful removal of tunneled right IJ hemodialysis catheter. Procedure performed by Prudence Brown PA-C supervised by Dr. Lowanda Ruddy Electronically Signed   By: Erica Hau M.D.   On: 01/06/2024 09:37   ECHOCARDIOGRAM COMPLETE Result Date: 01/05/2024    ECHOCARDIOGRAM REPORT   Patient Name:   Carol [name] Date of Exam: 01/05/2024 Medical Rec #:  [id number]               Height:       64.0 in Accession #:    [id number]              Weight:       147.0 lb Date of Birth:  May 07, 1961               BSA:          1.716 m Patient Age:    63 years                BP:           142/86 mmHg Patient Gender: F                       HR:           66 bpm. Exam Location:  Inpatient Procedure: 2D Echo, Cardiac Doppler, Color Doppler and Intracardiac  Opacification Agent (Both Spectral and Color Flow Doppler were            utilized during procedure). Indications:    Endocarditis  History:        Patient has prior history of Echocardiogram examinations, most                 recent 08/02/2023. Risk Factors:Diabetes and Hypertension.  Sonographer:    Janette Medley Referring Phys: [id number] Alica Inks IMPRESSIONS  1. Left ventricular ejection fraction, by estimation, is 70 to 75%. The left ventricle has hyperdynamic function. The left ventricle has no regional wall motion abnormalities. There is mild concentric left ventricular hypertrophy. Left ventricular diastolic parameters are indeterminate.  2. Right ventricular systolic function is normal. The right ventricular size is normal.  3. Left atrial size was mildly dilated.  4. Right atrial size was mildly dilated.  5. The mitral valve is abnormal. Mild mitral valve regurgitation. No  evidence of mitral stenosis.  6. The aortic valve is normal in structure. Aortic valve regurgitation is not visualized. No aortic stenosis is present.  7. The inferior vena cava is dilated in size with >50% respiratory variability, suggesting right atrial pressure of 8 mmHg. Conclusion(s)/Recommendation(s): No evidence of valvular vegetations on this transthoracic echocardiogram. Consider a transesophageal echocardiogram to exclude infective endocarditis if clinically indicated. FINDINGS  Left Ventricle: Left ventricular ejection fraction, by estimation, is 70 to 75%. The left ventricle has hyperdynamic function. The left ventricle has no regional wall motion abnormalities. The left ventricular internal cavity size was normal in size. There is mild concentric left ventricular hypertrophy. Left ventricular diastolic parameters are indeterminate. Right Ventricle: The right ventricular size is normal. No increase in right ventricular wall thickness. Right ventricular systolic function is normal. Left Atrium: Left atrial size was mildly dilated. Right Atrium: Right atrial size was mildly dilated. Pericardium: There is no evidence of pericardial effusion. Mitral Valve: The mitral valve is abnormal. There is mild calcification of the mitral valve leaflet(s). Mild mitral annular calcification. Mild mitral valve regurgitation. No evidence of mitral valve stenosis. Tricuspid Valve: The tricuspid valve is normal in structure. Tricuspid valve regurgitation is trivial. No evidence of tricuspid stenosis. Aortic Valve: The aortic valve is normal in structure. Aortic valve regurgitation is not visualized. No aortic stenosis is present. Pulmonic Valve: The pulmonic valve was normal in structure. Pulmonic valve regurgitation is not visualized. No evidence of pulmonic stenosis. Aorta: The aortic root is normal in size and structure. Venous: The inferior vena cava is dilated in size with greater than 50% respiratory variability,  suggesting right atrial pressure of 8 mmHg. IAS/Shunts: No atrial level shunt detected by color flow Doppler.  LEFT VENTRICLE PLAX 2D LVIDd:         4.30 cm   Diastology LVIDs:         2.80 cm   LV e' medial:    8.59 cm/s LV PW:         1.00 cm   LV E/e' medial:  16.8 LV IVS:        1.10 cm   LV e' lateral:   8.38 cm/s LVOT diam:     1.80 cm   LV E/e' lateral: 17.2 LV SV:         83 LV SV Index:   48 LVOT Area:     2.54 cm  RIGHT VENTRICLE             IVC RV S prime:     16.10  cm/s  IVC diam: 1.80 cm TAPSE (M-mode): 2.2 cm LEFT ATRIUM             Index        RIGHT ATRIUM           Index LA diam:        4.50 cm 2.62 cm/m   RA Area:     17.20 cm LA Vol (A2C):   61.8 ml 36.01 ml/m  RA Volume:   51.20 ml  29.83 ml/m LA Vol (A4C):   54.8 ml 31.93 ml/m LA Biplane Vol: 59.7 ml 34.78 ml/m  AORTIC VALVE LVOT Vmax:   150.00 cm/s LVOT Vmean:  103.000 cm/s LVOT VTI:    0.325 m  AORTA Ao Root diam: 2.60 cm Ao Asc diam:  3.10 cm MITRAL VALVE MV Area (PHT): 4.39 cm     SHUNTS MV Decel Time: 173 msec     Systemic VTI:  0.32 m MV E velocity: 144.00 cm/s  Systemic Diam: 1.80 cm MV A velocity: 72.80 cm/s MV E/A ratio:  1.98 Jules Oar MD Electronically signed by Jules Oar MD Signature Date/Time: 01/05/2024/3:51:08 PM    Final     Anti-infectives: Anti-infectives (From admission, onward)    Start     Dose/Rate Route Frequency Ordered Stop   01/05/24 1430  metroNIDAZOLE  (FLAGYL ) IVPB 500 mg        500 mg 100 mL/hr over 60 Minutes Intravenous Every 12 hours 01/05/24 1330     01/04/24 2030  micafungin  (MYCAMINE ) 100 mg in sodium chloride  0.9 % 100 mL IVPB        100 mg 105 mL/hr over 1 Hours Intravenous Every 24 hours 01/04/24 1940     01/04/24 1000  cefTRIAXone  (ROCEPHIN ) 2 g in sodium chloride  0.9 % 100 mL IVPB        2 g 200 mL/hr over 30 Minutes Intravenous Every 24 hours 01/03/24 2055     01/03/24 1615  cefTRIAXone  (ROCEPHIN ) 1 g in sodium chloride  0.9 % 100 mL IVPB        1 g 200 mL/hr over 30  Minutes Intravenous  Once 01/03/24 1601 01/03/24 1759   01/03/24 1615  azithromycin  (ZITHROMAX ) 500 mg in sodium chloride  0.9 % 250 mL IVPB        500 mg 250 mL/hr over 60 Minutes Intravenous  Once 01/03/24 1601 01/03/24 1915       Assessment/Plan: Cholelithiasis 63 y/o F admitted with fever, candida bacteremia with imaging studies concerning for inflammatory changes around the gallbladder.  -labs pending this morning as well as repeat blood cultures. -treatment of fungemia on going.  Holding on lap chole or further workup for now on the gallbladder pending need for other dialysis access  Moderate medical decision making    Oza Blumenthal 01/07/2024

## 2024-01-07 NOTE — Progress Notes (Addendum)
 Carol [name] Progress Note   Subjective:   Patient seen in her room this morning. She reports that she is feeling overwhelmed with being in the hospital. She was very tearful this morning. Her daughter was on the phone with her providing emotional support to her. We are planning on having temp cath placed tomorrow and proceeding with HD afterwards. She does not appear to be volume overloaded and her K is stable today. She denies dyspnea, CP, and N/V.   Objective Vitals:   01/07/24 0235 01/07/24 0506 01/07/24 0700 01/07/24 0810  BP: (!) 144/69 137/69 (!) 149/78 (!) 149/78  Pulse:  67 67 67  Resp: 16 18 18    Temp: 98.2 F (36.8 C) 98.4 F (36.9 C) 98.2 F (36.8 C) 98.2 F (36.8 C)  TempSrc: Oral Oral Oral   SpO2: 95% 95% 93% 93%  Weight:      Height:       Physical Exam General: Alert female in NAD; tearful Heart: RRR, no murmrus, rubs or gallops Lungs: CTA bilaterally, respirations unlabored Abdomen: Soft, non-distended, +BS  Extremities: No edema b/l lower extremities Dialysis Access:  TDC removed 01/06/24, AVF + t/b but somewhat small, needs 2nd stage surgery for AVF. Plan for temp catheter before next HD 01/08/24.   Additional Objective Labs: Basic Metabolic Panel: Recent Labs  Lab 01/05/24 1427 01/06/24 0532 01/07/24 0705  NA 134* 132* 134*  K 5.3* 3.8 4.1  CL 100 96* 98  CO2 20* 24 23  GLUCOSE 91 94 77  BUN 41* 22 35*  CREATININE 7.51* 4.56* 6.05*  CALCIUM  8.5* 8.1* 8.2*   Liver Function Tests: Recent Labs  Lab 01/04/24 0529 01/05/24 1427  AST 112* 38  ALT 44 29  ALKPHOS 124 128*  BILITOT 1.0 0.6  PROT 6.3* 6.9  ALBUMIN  2.5* 2.8*   No results for input(s): "LIPASE", "AMYLASE" in the last 168 hours. CBC: Recent Labs  Lab 01/03/24 1407 01/04/24 0702 01/06/24 0532 01/07/24 0705  WBC 1.3*  1.2* 15.0* 8.2 7.6  NEUTROABS 1.0*  --   --   --   HGB 10.5*  10.3* 9.0* 9.2* 8.5*  HCT 32.5*  32.9* 27.9* 28.3* 25.7*  MCV 91.5  91.6 92.1  89.0 88.9  PLT 162  154 110* 134* 119*   Blood Culture    Component Value Date/Time   SDES BLOOD SITE NOT SPECIFIED 01/06/2024 2022   SDES BLOOD SITE NOT SPECIFIED 01/06/2024 2022   SPECREQUEST  01/06/2024 2022    BOTTLES DRAWN AEROBIC AND ANAEROBIC Blood Culture adequate volume   SPECREQUEST  01/06/2024 2022    BOTTLES DRAWN AEROBIC AND ANAEROBIC Blood Culture adequate volume   CULT  01/06/2024 2022    NO GROWTH < 12 HOURS Performed at [facility], [address], [place], [place] [address]    CULT  01/06/2024 2022    NO GROWTH < 12 HOURS Performed at [facility], [address], [place], [place] [address]    REPTSTATUS PENDING 01/06/2024 2022   REPTSTATUS PENDING 01/06/2024 2022    Cardiac Enzymes: No results for input(s): "CKTOTAL", "CKMB", "CKMBINDEX", "TROPONINI" in the last 168 hours. CBG: Recent Labs  Lab 01/04/24 0823 01/04/24 1210 01/04/24 1747 01/04/24 1822  GLUCAP 71 87 65* 76   Iron  Studies: No results for input(s): "IRON ", "TIBC", "TRANSFERRIN", "FERRITIN" in the last 72 hours. @lablastinr3 @ Studies/Results: IR Removal Tun Cv Cath W/O FL Result Date: 01/06/2024 INDICATION: End-stage renal disease on hemodialysis. Candidemia. Request removal of  tunneled HD catheter. Catheter originally placed by surgical service 07/2023 EXAM: REMOVAL OF TUNNELED RIGHT IJ HEMODIALYSIS CATHETER MEDICATIONS: 1% plain lidocaine , 8 mL COMPLICATIONS: None immediate. PROCEDURE: Informed written consent was obtained from the patient following an explanation of the procedure, risks, benefits and alternatives to treatment. A time out was performed prior to the initiation of the procedure. Maximal barrier sterile technique was utilized including caps, mask, sterile gowns, sterile gloves, large sterile drape, hand hygiene, and chlorhexidine . 1% lidocaine  was injected under sterile conditions along the subcutaneous tunnel. Utilizing a combination of blunt dissection and gentle  traction, the cuff of the catheter was exposed and the catheter was removed intact. Hemostasis was obtained with manual compression. A dressing was placed. The patient tolerated the procedure well without immediate post procedural complication. IMPRESSION: Successful removal of tunneled right IJ hemodialysis catheter. Procedure performed by Prudence Brown PA-C supervised by Dr. Lowanda Ruddy Electronically Signed   By: Erica Hau M.D.   On: 01/06/2024 09:37   ECHOCARDIOGRAM COMPLETE Result Date: 01/05/2024    ECHOCARDIOGRAM REPORT   Patient Name:   Carol [name] Date of Exam: 01/05/2024 Medical Rec #:  [id number]               Height:       64.0 in Accession #:    [id number]              Weight:       147.0 lb Date of Birth:  16-Dec-1960               BSA:          1.716 m Patient Age:    63 years                BP:           142/86 mmHg Patient Gender: F                       HR:           66 bpm. Exam Location:  Inpatient Procedure: 2D Echo, Cardiac Doppler, Color Doppler and Intracardiac            Opacification Agent (Both Spectral and Color Flow Doppler were            utilized during procedure). Indications:    Endocarditis  History:        Patient has prior history of Echocardiogram examinations, most                 recent 08/02/2023. Risk Factors:Diabetes and Hypertension.  Sonographer:    Janette Medley Referring Phys: [id number] Alica Inks IMPRESSIONS  1. Left ventricular ejection fraction, by estimation, is 70 to 75%. The left ventricle has hyperdynamic function. The left ventricle has no regional wall motion abnormalities. There is mild concentric left ventricular hypertrophy. Left ventricular diastolic parameters are indeterminate.  2. Right ventricular systolic function is normal. The right ventricular size is normal.  3. Left atrial size was mildly dilated.  4. Right atrial size was mildly dilated.  5. The mitral valve is abnormal. Mild mitral valve regurgitation. No evidence of  mitral stenosis.  6. The aortic valve is normal in structure. Aortic valve regurgitation is not visualized. No aortic stenosis is present.  7. The inferior vena cava is dilated in size with >50% respiratory variability, suggesting right atrial pressure of 8 mmHg. Conclusion(s)/Recommendation(s): No evidence of valvular vegetations on this transthoracic echocardiogram. Consider a transesophageal echocardiogram  to exclude infective endocarditis if clinically indicated. FINDINGS  Left Ventricle: Left ventricular ejection fraction, by estimation, is 70 to 75%. The left ventricle has hyperdynamic function. The left ventricle has no regional wall motion abnormalities. The left ventricular internal cavity size was normal in size. There is mild concentric left ventricular hypertrophy. Left ventricular diastolic parameters are indeterminate. Right Ventricle: The right ventricular size is normal. No increase in right ventricular wall thickness. Right ventricular systolic function is normal. Left Atrium: Left atrial size was mildly dilated. Right Atrium: Right atrial size was mildly dilated. Pericardium: There is no evidence of pericardial effusion. Mitral Valve: The mitral valve is abnormal. There is mild calcification of the mitral valve leaflet(s). Mild mitral annular calcification. Mild mitral valve regurgitation. No evidence of mitral valve stenosis. Tricuspid Valve: The tricuspid valve is normal in structure. Tricuspid valve regurgitation is trivial. No evidence of tricuspid stenosis. Aortic Valve: The aortic valve is normal in structure. Aortic valve regurgitation is not visualized. No aortic stenosis is present. Pulmonic Valve: The pulmonic valve was normal in structure. Pulmonic valve regurgitation is not visualized. No evidence of pulmonic stenosis. Aorta: The aortic root is normal in size and structure. Venous: The inferior vena cava is dilated in size with greater than 50% respiratory variability, suggesting right  atrial pressure of 8 mmHg. IAS/Shunts: No atrial level shunt detected by color flow Doppler.  LEFT VENTRICLE PLAX 2D LVIDd:         4.30 cm   Diastology LVIDs:         2.80 cm   LV e' medial:    8.59 cm/s LV PW:         1.00 cm   LV E/e' medial:  16.8 LV IVS:        1.10 cm   LV e' lateral:   8.38 cm/s LVOT diam:     1.80 cm   LV E/e' lateral: 17.2 LV SV:         83 LV SV Index:   48 LVOT Area:     2.54 cm  RIGHT VENTRICLE             IVC RV S prime:     16.10 cm/s  IVC diam: 1.80 cm TAPSE (M-mode): 2.2 cm LEFT ATRIUM             Index        RIGHT ATRIUM           Index LA diam:        4.50 cm 2.62 cm/m   RA Area:     17.20 cm LA Vol (A2C):   61.8 ml 36.01 ml/m  RA Volume:   51.20 ml  29.83 ml/m LA Vol (A4C):   54.8 ml 31.93 ml/m LA Biplane Vol: 59.7 ml 34.78 ml/m  AORTIC VALVE LVOT Vmax:   150.00 cm/s LVOT Vmean:  103.000 cm/s LVOT VTI:    0.325 m  AORTA Ao Root diam: 2.60 cm Ao Asc diam:  3.10 cm MITRAL VALVE MV Area (PHT): 4.39 cm     SHUNTS MV Decel Time: 173 msec     Systemic VTI:  0.32 m MV E velocity: 144.00 cm/s  Systemic Diam: 1.80 cm MV A velocity: 72.80 cm/s MV E/A ratio:  1.98 Jules Oar MD Electronically signed by Jules Oar MD Signature Date/Time: 01/05/2024/3:51:08 PM    Final    Medications:  cefTRIAXone  (ROCEPHIN )  IV 2 g (01/07/24 0834)   micafungin  (MYCAMINE ) 100 mg in sodium chloride   0.9 % 100 mL IVPB 100 mg (01/06/24 2104)   [START ON 01/08/2024] vancomycin      (feeding supplement) PROSource Plus  30 mL Oral BID BM   (feeding supplement) PROSource Plus       amLODipine   10 mg Oral Daily   aspirin  EC  81 mg Oral Daily   atorvastatin   80 mg Oral Daily   Chlorhexidine  Gluconate Cloth  6 each Topical Q0600   gabapentin   100 mg Oral BID   heparin   5,000 Units Subcutaneous Q8H   metroNIDAZOLE   500 mg Oral Q12H   pantoprazole   40 mg Oral QHS    Dialysis Orders  [facility] M[facility]  4h   B500   70.5kg    TDC    Heparin  2000 Has been coming off 2-4kg over last 2 wks Had 30  min Rx on 5/16    Assessment/Plan: Fever / leukopenia: blood cultures showing candidemia. ID on board. Needs line holiday. Had HD overnight and her [facility] was removed this am. Discussed case with IR PA.  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 On Flagyl  500 mg q 12 hours and Mycamine  100 mg q 24 hours.  ESRD: on HD MWF. Had full HD Wed, minimal dialysis on Friday. Had HD 01/05/24 with 3L UF. Plan to place temp cath on 01/08/24, and once she has negative BC, we can place [facility] and remove temp cath. 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 Next dose due 5/19. Holding IV Fe with infection  Possible cholecystitis: surgery consulted 01/06/24. CT of the chest/abdomen/pelvis shows atypical inflammatory changes of her lungs as well as gallstones with trace pericholecystic fluid. RUQ U/S shows stones, wall thickening, and pericholecystic fluid. Both of this studies also show a nodular liver contour concerning for possible cirrhosis  Secondary hyperparathyroidism: CCa 9.6, check phos with next labs.  Nutrition: albumin  2.8 on renal diet. Started supplements.   Hersey Lorenzo, NP-C 01/07/2024, 12:27 PM  [name]   Seen and examined independently this AM.  Agree with note and exam as documented above by physician extender and as noted here.  General adult female in bed in no acute distress HEENT normocephalic atraumatic extraocular movements intact sclera anicteric Neck supple trachea midline Lungs clear to auscultation bilaterally normal work of breathing at rest on room air Heart S1S2 no rub Abdomen soft nontender nondistended Extremities no pitting edema  Psych normal mood and affect Neuro - alert and oriented x 3 provides hx and follows commands   Fungemia - on a line holiday  ESRD - HD tomorrow after nontunneled catheter placement with IR.   HTN - controlled  Nan Aver, MD 01/07/2024  5:40 PM

## 2024-01-07 NOTE — Plan of Care (Signed)

## 2024-01-07 NOTE — Consult Note (Signed)
 Chief Complaint: Bacteremia. Request is for tunneled HD catheter placement after line holiday   Referring Physician(s): Hersey Lorenzo NP  Supervising Physician: Alyssa Jumper  Patient Status: [facility] - In-pt  History of Present Illness: Carol [name] is a 63 y.o. female inpatient. History of ESRD on HD, anemia of chronic disease. Presented to the ED at [facility] on 5.16.25 with fevers and chills. Found to be bacteremic. Blood cultures positive for candida. IR removed a RIJ tunneled Dialysis catheter on 5.19.25 for a line holiday. Team is requesting a replacement tunneled HD catheter  Currently without any significant complaints. Patient alert and laying in bed,calm. Denies any fevers, headache, chest pain, SOB, cough, abdominal pain, nausea, vomiting or bleeding.   BUN 35, Cr 6.05, GFR < 7. Hgb 8.5, PLT 119. Allergies include PCN. Blood cultures from 5.19.25 negative X 1 day.   Return precautions and treatment recommendations and follow-up discussed with the patient who is agreeable with the plan.    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   Chronic back pain    Depression    Diabetes mellitus    ESRD (end stage renal disease) on dialysis [facility])    M,W,F   GERD (gastroesophageal reflux disease)    Heart murmur    Hypertension    NSTEMI (non-ST elevated myocardial infarction) (HCC) 12/20/2021   Palpitations    Parkinson's disease (HCC)     Past Surgical History:  Procedure Laterality Date   AV FISTULA PLACEMENT Left 08/01/2023   Procedure: LEFT ARM BRACHIOBASILIC FISTULA CREATION;  Surgeon: Philipp Brawn, MD;  Location[facility] OR;  Service: Vascular;  Laterality: Left;   BALLOON DILATION N/A 11/06/2022   Procedure: BALLOON DILATION;  Surgeon: Sergio Dandy, MD;  Location: [facility] ENDOSCOPY;  Service: Gastroenterology;  Laterality: N/A;   CARPAL TUNNEL RELEASE      COLONOSCOPY WITH PROPOFOL  N/A 11/06/2022   Procedure: COLONOSCOPY WITH PROPOFOL ;  Surgeon: Sergio Dandy, MD;  Location: [facility] ENDOSCOPY;  Service: Gastroenterology;  Laterality: N/A;   ESOPHAGOGASTRODUODENOSCOPY (EGD) WITH PROPOFOL  N/A 11/06/2022   Procedure: ESOPHAGOGASTRODUODENOSCOPY (EGD) WITH PROPOFOL ;  Surgeon: Sergio Dandy, MD;  Location: [facility] ENDOSCOPY;  Service: Gastroenterology;  Laterality: N/A;   INSERTION OF DIALYSIS CATHETER Right 08/01/2023   Procedure: INSERTION OF TUNNELED DIALYSIS CATHETER;  Surgeon: Philipp Brawn, MD;  Location: [facility] OR;  Service: Vascular;  Laterality: Right;   IR FLUORO GUIDE CV LINE RIGHT  01/26/2022   IR REMOVAL TUN CV CATH W/O FL  01/06/2024   IR US  GUIDE VASC ACCESS RIGHT  01/26/2022   LEFT HEART CATH AND CORONARY ANGIOGRAPHY N/A 12/20/2021   Procedure: LEFT HEART CATH AND CORONARY ANGIOGRAPHY;  Surgeon: Odie Benne, MD;  Location: [facility] INVASIVE CV LAB;  Service: Cardiovascular;  Laterality: N/A;   OPEN REDUCTION INTERNAL FIXATION (ORIF) FINGER WITH RADIAL BONE GRAFT Right 09/12/2015   Procedure: RIGHT MIDDLE FINGER CLOSED REDUCTION AND PINNING;  Surgeon: Arvil Birks, MD;  Location: [facility] OR;  Service: Orthopedics;  Laterality: Right;   TONSILLECTOMY     TUBAL LIGATION      Allergies: Penicillins and Ultram  [tramadol ]  Medications: Prior to Admission medications   Medication Sig Start Date End Date Taking? Authorizing Provider  albuterol  (PROVENTIL  HFA;VENTOLIN  HFA) 108 (90 Base) MCG/ACT inhaler Inhale 2 puffs into the lungs every 4 (four) hours as needed for wheezing or shortness of breath.   Yes  [provider]  albuterol  (VENTOLIN  HFA) 108 (90 Base) MCG/ACT inhaler Inhale 1-2 puffs into the lungs every 6 (six) hours as needed for wheezing or shortness of breath. 08/25/23  Yes Russella Courts A, DO  amLODipine  (NORVASC ) 10 MG tablet Take 1 tablet (10 mg total) by mouth daily. 11/01/22  Yes Milford, Arlice Bene, FNP  aspirin  EC 81  MG tablet Take 1 tablet (81 mg total) by mouth daily. Swallow whole. 05/11/22  Yes Barnetta Liberty, MD  atorvastatin  (LIPITOR ) 80 MG tablet Take 1 tablet (80 mg total) by mouth daily. 03/11/23  Yes Haydee Lipa, MD  budesonide -formoterol  [facility]) 160-4.5 MCG/ACT inhaler Inhale 2 puffs into the lungs in the morning and at bedtime. 11/09/21  Yes [provider]  cetirizine (ZYRTEC) 10 MG tablet Take 10 mg by mouth daily as needed for allergies.   Yes [provider]  Darbepoetin Alfa  (ARANESP ) 60 MCG/0.3ML SOSY injection Inject 0.3 mLs (60 mcg total) into the skin every Thursday at 6pm. 08/08/23  Yes Dezii, Alexandra, DO  eszopiclone 3 MG TABS Take 3 mg by mouth at bedtime. Take immediately before bedtime   Yes [provider]  gabapentin  (NEURONTIN ) 100 MG capsule Take 1 capsule (100 mg total) by mouth 2 (two) times daily. 08/14/23 01/03/24 Yes Dezii, Alexandra, DO  glucagon  1 MG injection Follow package directions for low blood sugar. 08/14/23 08/13/24 Yes Dezii, Alexandra, DO  guaiFENesin -dextromethorphan (ROBITUSSIN DM) 100-10 MG/5ML syrup Take 5 mLs by mouth every 4 (four) hours as needed for cough. 08/25/23  Yes Russella Courts A, DO  lidocaine  (LIDODERM ) 5 % Place 1 patch onto the skin daily. Remove & Discard patch within 12 hours or as directed by MD 12/10/23  Yes Dorenda Gandy, MD  naloxone  (NARCAN ) nasal spray 4 mg/0.1 mL If concern for opioid overdose, spray in nostril. Call 911. 08/14/23  Yes Dezii, Alexandra, DO  naphazoline-pheniramine (NAPHCON-A) 0.025-0.3 % ophthalmic solution Place 2 drops into both eyes 2 (two) times daily.   Yes [provider]  nitroGLYCERIN  (NITROSTAT ) 0.4 MG SL tablet Place 1 tablet (0.4 mg total) under the tongue every 5 (five) minutes as needed for chest pain. 05/11/22 01/03/24 Yes Barnetta Liberty, MD  oxyCODONE -acetaminophen  (PERCOCET/ROXICET) 5-325 MG tablet Take 1 tablet by mouth every 8 (eight) hours as needed for severe pain  (pain score 7-10). 12/10/23  Yes Dorenda Gandy, MD  pantoprazole  (PROTONIX ) 40 MG tablet Take 1 tablet (40 mg total) by mouth daily at 12 noon. Patient taking differently: Take 40 mg by mouth at bedtime. 05/11/22  Yes Barnetta Liberty, MD  promethazine -dextromethorphan (PROMETHAZINE -DM) 6.25-15 MG/5ML syrup Take 5 mLs by mouth 4 (four) times daily as needed for cough. 11/07/23  Yes Dalene Duck, MD  triamcinolone  ointment (KENALOG ) 0.1 % 1 Application 2 (two) times daily as needed. 06/29/23  Yes [provider]  doxycycline  (VIBRAMYCIN ) 100 MG capsule Take 1 capsule (100 mg total) by mouth 2 (two) times daily. One po bid x 7 days Patient not taking: Reported on 01/03/2024 11/07/23   Dalene Duck, MD     Family History  Problem Relation Age of Onset   Diabetes Mother    Kidney disease Mother    Seizures Father    Hypertension Father    Stroke Father    Asthma Other     Social History   Socioeconomic History   Marital status: Single    Spouse name: Not on file   Number of children: 3   Years of education:  Not on file   Highest education level: Not on file  Occupational History   Not on file  Tobacco Use   Smoking status: Some Days    Current packs/day: 0.25    Average packs/day: 0.3 packs/day for 3.0 years (0.8 ttl pk-yrs)    Types: Cigarettes    Passive exposure: Current   Smokeless tobacco: Never  Vaping Use   Vaping status: Never Used  Substance and Sexual Activity   Alcohol use: Yes    Alcohol/week: 0.0 standard drinks of alcohol    Comment: occasional   Drug use: Yes    Types: Cocaine    Sexual activity: Not Currently  Other Topics Concern   Not on file  Social History Narrative   Not on file   Social Drivers of Health   Financial Resource Strain: Low Risk  (04/04/2022)   Overall Financial Resource Strain (CARDIA)    Difficulty of Paying Living Expenses: Not very hard  Food Insecurity: No Food Insecurity (01/03/2024)   Hunger Vital Sign     Worried About Running Out of Food in the Last Year: Never true    Ran Out of Food in the Last Year: Never true  Transportation Needs: No Transportation Needs (01/03/2024)   PRAPARE - Administrator, Civil Service (Medical): No    Lack of Transportation (Non-Medical): No  Physical Activity: Not on file  Stress: Not on file  Social Connections: Not on file    Review of Systems: A 12 point ROS discussed and pertinent positives are indicated in the HPI above.  All other systems are negative.  Review of Systems  Constitutional:  Negative for fatigue and fever.  HENT:  Negative for congestion.   Respiratory:  Negative for cough and shortness of breath.   Gastrointestinal:  Negative for abdominal pain, diarrhea, nausea and vomiting.    Vital Signs: BP (!) 149/78   Pulse 67   Temp 98.2 F (36.8 C)   Resp 18   Ht 5\' 4"  (1.626 m)   Wt 147 lb (66.7 kg)   SpO2 93%   BMI 25.23 kg/m     Physical Exam Vitals and nursing note reviewed.  Constitutional:      Appearance: She is well-developed.  HENT:     Head: Normocephalic and atraumatic.  Eyes:     Conjunctiva/sclera: Conjunctivae normal.  Cardiovascular:     Rate and Rhythm: Normal rate and regular rhythm.  Pulmonary:     Effort: Pulmonary effort is normal.  Musculoskeletal:        General: Normal range of motion.     Cervical back: Normal range of motion.  Skin:    General: Skin is warm and dry.  Neurological:     General: No focal deficit present.     Mental Status: She is alert and oriented to person, place, and time.  Psychiatric:        Mood and Affect: Mood normal.        Behavior: Behavior normal.     Imaging: IR Removal Tun Cv Cath W/O FL Result Date: 01/06/2024 INDICATION: End-stage renal disease on hemodialysis. Candidemia. Request removal of tunneled HD catheter. Catheter originally placed by surgical service 07/2023 EXAM: REMOVAL OF TUNNELED RIGHT IJ HEMODIALYSIS CATHETER MEDICATIONS: 1% plain  lidocaine , 8 mL COMPLICATIONS: None immediate. PROCEDURE: Informed written consent was obtained from the patient following an explanation of the procedure, risks, benefits and alternatives to treatment. A time out was performed prior to the initiation of the procedure.  Maximal barrier sterile technique was utilized including caps, mask, sterile gowns, sterile gloves, large sterile drape, hand hygiene, and chlorhexidine . 1% lidocaine  was injected under sterile conditions along the subcutaneous tunnel. Utilizing a combination of blunt dissection and gentle traction, the cuff of the catheter was exposed and the catheter was removed intact. Hemostasis was obtained with manual compression. A dressing was placed. The patient tolerated the procedure well without immediate post procedural complication. IMPRESSION: Successful removal of tunneled right IJ hemodialysis catheter. Procedure performed by Prudence Brown PA-C supervised by Dr. Lowanda Ruddy Electronically Signed   By: Erica Hau M.D.   On: 01/06/2024 09:37   ECHOCARDIOGRAM COMPLETE Result Date: 01/05/2024    ECHOCARDIOGRAM REPORT   Patient Name:   Carol [name] Date of Exam: 01/05/2024 Medical Rec #:  [id number]               Height:       64.0 in Accession #:    [id number]              Weight:       147.0 lb Date of Birth:  [birth year]               BSA:          1.716 m Patient Age:    63 years                BP:           142/86 mmHg Patient Gender: F                       HR:           66 bpm. Exam Location:  Inpatient Procedure: 2D Echo, Cardiac Doppler, Color Doppler and Intracardiac            Opacification Agent (Both Spectral and Color Flow Doppler were            utilized during procedure). Indications:    Endocarditis  History:        Patient has prior history of Echocardiogram examinations, most                 recent 08/02/2023. Risk Factors:Diabetes and Hypertension.  Sonographer:    Janette Medley Referring Phys: [id number] Alica Inks IMPRESSIONS  1. Left ventricular ejection fraction, by estimation, is 70 to 75%. The left ventricle has hyperdynamic function. The left ventricle has no regional wall motion abnormalities. There is mild concentric left ventricular hypertrophy. Left ventricular diastolic parameters are indeterminate.  2. Right ventricular systolic function is normal. The right ventricular size is normal.  3. Left atrial size was mildly dilated.  4. Right atrial size was mildly dilated.  5. The mitral valve is abnormal. Mild mitral valve regurgitation. No evidence of mitral stenosis.  6. The aortic valve is normal in structure. Aortic valve regurgitation is not visualized. No aortic stenosis is present.  7. The inferior vena cava is dilated in size with >50% respiratory variability, suggesting right atrial pressure of 8 mmHg. Conclusion(s)/Recommendation(s): No evidence of valvular vegetations on this transthoracic echocardiogram. Consider a transesophageal echocardiogram to exclude infective endocarditis if clinically indicated. FINDINGS  Left Ventricle: Left ventricular ejection fraction, by estimation, is 70 to 75%. The left ventricle has hyperdynamic function. The left ventricle has no regional wall motion abnormalities. The left ventricular internal cavity size was normal in size. There is mild concentric left ventricular hypertrophy. Left ventricular diastolic parameters are indeterminate. Right Ventricle:  The right ventricular size is normal. No increase in right ventricular wall thickness. Right ventricular systolic function is normal. Left Atrium: Left atrial size was mildly dilated. Right Atrium: Right atrial size was mildly dilated. Pericardium: There is no evidence of pericardial effusion. Mitral Valve: The mitral valve is abnormal. There is mild calcification of the mitral valve leaflet(s). Mild mitral annular calcification. Mild mitral valve regurgitation. No evidence of mitral valve stenosis. Tricuspid  Valve: The tricuspid valve is normal in structure. Tricuspid valve regurgitation is trivial. No evidence of tricuspid stenosis. Aortic Valve: The aortic valve is normal in structure. Aortic valve regurgitation is not visualized. No aortic stenosis is present. Pulmonic Valve: The pulmonic valve was normal in structure. Pulmonic valve regurgitation is not visualized. No evidence of pulmonic stenosis. Aorta: The aortic root is normal in size and structure. Venous: The inferior vena cava is dilated in size with greater than 50% respiratory variability, suggesting right atrial pressure of 8 mmHg. IAS/Shunts: No atrial level shunt detected by color flow Doppler.  LEFT VENTRICLE PLAX 2D LVIDd:         4.30 cm   Diastology LVIDs:         2.80 cm   LV e' medial:    8.59 cm/s LV PW:         1.00 cm   LV E/e' medial:  16.8 LV IVS:        1.10 cm   LV e' lateral:   8.38 cm/s LVOT diam:     1.80 cm   LV E/e' lateral: 17.2 LV SV:         83 LV SV Index:   48 LVOT Area:     2.54 cm  RIGHT VENTRICLE             IVC RV S prime:     16.10 cm/s  IVC diam: 1.80 cm TAPSE (M-mode): 2.2 cm LEFT ATRIUM             Index        RIGHT ATRIUM           Index LA diam:        4.50 cm 2.62 cm/m   RA Area:     17.20 cm LA Vol (A2C):   61.8 ml 36.01 ml/m  RA Volume:   51.20 ml  29.83 ml/m LA Vol (A4C):   54.8 ml 31.93 ml/m LA Biplane Vol: 59.7 ml 34.78 ml/m  AORTIC VALVE LVOT Vmax:   150.00 cm/s LVOT Vmean:  103.000 cm/s LVOT VTI:    0.325 m  AORTA Ao Root diam: 2.60 cm Ao Asc diam:  3.10 cm MITRAL VALVE MV Area (PHT): 4.39 cm     SHUNTS MV Decel Time: 173 msec     Systemic VTI:  0.32 m MV E velocity: 144.00 cm/s  Systemic Diam: 1.80 cm MV A velocity: 72.80 cm/s MV E/A ratio:  1.98 Jules Oar MD Electronically signed by Jules Oar MD Signature Date/Time: 01/05/2024/3:51:08 PM    Final    US  Abdomen Limited RUQ (LIVER/GB) Result Date: 01/04/2024 CLINICAL DATA:  Abnormal CT EXAM: ULTRASOUND ABDOMEN LIMITED RIGHT UPPER QUADRANT  COMPARISON:  CT abdomen and pelvis 01/03/2024 FINDINGS: Gallbladder: Gallstones are present measuring up to 1.6 cm. Pericholecystic fluid is present. The gallbladder wall thickened measuring 10 mm. Sonographic Abigail Abler sign is negative per sonographer. Common bile duct: Diameter: 5.5 mm. Liver: No focal lesion identified. There is lobulated liver contour. Within normal limits in parenchymal echogenicity. Portal vein  is patent on color Doppler imaging with normal direction of blood flow towards the liver. Other: None. IMPRESSION: 1. Cholelithiasis with gallbladder wall thickening and pericholecystic fluid. Findings are concerning for acute cholecystitis. 2. Lobulated liver contour, which can be seen in the setting of cirrhosis. Electronically Signed   By: Tyron Gallon M.D.   On: 01/04/2024 22:28   US  THYROID  Result Date: 01/04/2024 CLINICAL DATA:  Nodule on CT chest EXAM: THYROID  ULTRASOUND TECHNIQUE: Ultrasound examination of the thyroid  gland and adjacent soft tissues was performed. COMPARISON:  CT 01/03/2024 FINDINGS: Parenchymal Echotexture: Mildly heterogenous Isthmus: 0.8 cm thickness Right lobe: 4 x 1.2 x 2.1 cm Left lobe: _________________________________________________________ Estimated total number of nodules >/= 1 cm: 3 Number of spongiform nodules >/=  2 cm not described below (TR1): 0 Number of mixed cystic and solid nodules >/= 1.5 cm not described below (TR2): 0 _________________________________________________________ Nodule # 1: Location: Right; inferior Maximum size: 1.6 cm; Other 2 dimensions: 1.2 x 1.5 cm Composition: mixed cystic and solid (1) Echogenicity: isoechoic (1) Shape: not taller-than-wide (0) Margins: ill-defined (0) Echogenic foci: none (0) ACR TI-RADS total points: 2. ACR TI-RADS risk category: TR 2. ACR TI-RADS recommendations: This nodule does NOT meet TI-RADS criteria for biopsy or dedicated follow-up. _________________________________________________________ Nodule # 2: 0.9 cm  hypoechoic nodule without calcifications, mid right; This nodule does NOT meet TI-RADS criteria for biopsy or dedicated follow-up. Nodule # 3: Location: Left; mid Maximum size: 3.6 cm; Other 2 dimensions: 2.7 x 2.9 cm Composition: mixed cystic and solid (1) Echogenicity: hyperechoic (1) Shape: taller-than-wide (3) Margins: ill-defined (0) Echogenic foci: none (0) ACR TI-RADS total points: 5. ACR TI-RADS risk category: TR 4. ACR TI-RADS recommendations: **Given size (>/= 1.5 cm) and appearance, fine needle aspiration of this moderately suspicious nodule should be considered based on TI-RADS criteria. _________________________________________________________ Nodule # 4: Location: Left; inferior Maximum size: 1.2 cm; Other 2 dimensions: 1 x 1.1 cm Composition: solid/almost completely solid (2) Echogenicity: isoechoic (1) Shape: not taller-than-wide (0) Margins: ill-defined (0) Echogenic foci: none (0) ACR TI-RADS total points: 3. ACR TI-RADS risk category: TR 3. ACR TI-RADS recommendations: Given size (<1.4 cm) and appearance, this nodule does NOT meet TI-RADS criteria for biopsy or dedicated follow-up. _________________________________________________________ No regional cervical adenopathy. IMPRESSION: 3.6 cm left mid thyroid  nodule, meeting criteria for FNA. Electronically Signed   By: Nicoletta Barrier M.D.   On: 01/04/2024 16:26   MR LUMBAR SPINE WO CONTRAST Result Date: 01/04/2024 CLINICAL DATA:  63 year old female with end-stage renal disease. Fevers and chills, diarrhea, possible sepsis. Back pain. EXAM: MRI LUMBAR SPINE WITHOUT CONTRAST TECHNIQUE: Multiplanar, multisequence MR imaging of the lumbar spine was performed. No intravenous contrast was administered. COMPARISON:  CT Abdomen and Pelvis yesterday. Lumbar MRI 02/01/2014. FINDINGS: Segmentation:  Normal on the CT yesterday. Alignment:  Stable lumbar lordosis. Vertebrae: L2 inferior endplate Schmorl's node, new since 2015 but well documented on the CT  yesterday with adjacent L2-L3 vacuum disc. Maintained lumbar vertebral height. 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 No suspicious marrow edema identified.  Visible sacrum intact. Conus medullaris and cauda equina: Conus extends to the T12-L1 level. No lower spinal cord or conus signal abnormality. Capacious spinal canal at most levels. Negative cauda equina nerve roots. Paraspinal and other soft tissues: Stable to the CT Abdomen and Pelvis yesterday, including posterior subcutaneous edema. No suspicious paraspinal inflammation. Disc levels: Capacious spinal canal  at most levels. Age-appropriate lumbar spine degeneration at most levels. Conspicuous disc degeneration at L2-L3 (with trace vacuum disc yesterday and associated L2 Schmorl's node), L5-S1. No lumbar spinal stenosis. IMPRESSION: 1. No evidence of Lumbar discitis osteomyelitis. An L2 inferior endplate Schmorl's node is noted and was well demonstrated by CT yesterday. 2. Mild for age lumbar spine degeneration and capacious underlying spinal canal, no lumbar spinal stenosis. 3. Hemosiderosis. Electronically Signed   By: Marlise Simpers M.D.   On: 01/04/2024 04:38   CT ABDOMEN PELVIS WO CONTRAST Result Date: 01/03/2024 CLINICAL DATA:  Fever EXAM: CT ABDOMEN AND PELVIS WITHOUT CONTRAST TECHNIQUE: Multidetector CT imaging of the abdomen and pelvis was performed following the standard protocol without IV contrast. RADIATION DOSE REDUCTION: This exam was performed according to the departmental dose-optimization program which includes automated exposure control, adjustment of the mA and/or kV according to patient size and/or use of iterative reconstruction technique. COMPARISON:  CT 01/15/2020 FINDINGS: Lower chest: See separately dictated chest CT Hepatobiliary: Gallstones. Gallbladder wall thickening versus trace pericholecystic fluid. No biliary  dilatation. Possible surface nodularity of the liver Pancreas: Unremarkable. No pancreatic ductal dilatation or surrounding inflammatory changes. Spleen: Normal in size without focal abnormality. Adrenals/Urinary Tract: Stable adrenal glands. No hydronephrosis. Nonspecific left greater than right perinephric stranding. The bladder is slightly thick walled. Stomach/Bowel: The stomach is nonenlarged. Scattered fluid-filled small bowel without definitive obstruction. No acute bowel wall thickening. Diverticular disease of the colon Vascular/Lymphatic: Aortic atherosclerosis without aneurysm. Borderline Peri aortic lymph nodes measuring up to 11 mm. Reproductive: Possible uterine fibroids.  No suspicious adnexal mass Other: Negative for pelvic effusion or free air Musculoskeletal: No acute or suspicious osseous abnormality IMPRESSION: 1. Gallstones with gallbladder wall thickening versus trace pericholecystic fluid. Follow-up right upper quadrant ultrasound as indicated. 2. Diverticular disease of the colon without acute bowel wall thickening. 3. Nonspecific left greater than right perinephric stranding. Slight bladder wall thickening, correlate with urinalysis to exclude cystitis and ascending urinary tract infection. 4. Possible surface nodularity/liver cirrhosis 5. Aortic atherosclerosis. Aortic Atherosclerosis (ICD10-I70.0). Electronically Signed   By: Esmeralda Hedge M.D.   On: 01/03/2024 22:44   CT CHEST WO CONTRAST Result Date: 01/03/2024 CLINICAL DATA:  Chest pain.  Respiratory illness. EXAM: CT CHEST WITHOUT CONTRAST TECHNIQUE: Multidetector CT imaging of the chest was performed following the standard protocol without IV contrast. RADIATION DOSE REDUCTION: This exam was performed according to the departmental dose-optimization program which includes automated exposure control, adjustment of the mA and/or kV according to patient size and/or use of iterative reconstruction technique. COMPARISON:  January 22, 2022.  FINDINGS: Cardiovascular: Mild cardiomegaly. No pericardial effusion. Right internal jugular dialysis catheter is noted with distal tip at cavoatrial junction. Atherosclerosis of thoracic aorta without aneurysm formation. Mediastinum/Nodes: 3.4 cm left thyroid  nodule is noted. Esophagus is unremarkable. No adenopathy. Lungs/Pleura: No pneumothorax or pleural effusion is noted. Mild peripheral septal thickening is noted bilaterally, right greater than left, concerning for possible edema or atypical inflammation. Upper Abdomen: No acute abnormality. Musculoskeletal: Probable subacute fractures are seen involving the posterior portions of the left sixth, seventh and eighth ribs. Probable subacute right seventh rib fracture is noted as well. IMPRESSION: Mild peripheral septal thickening is noted in both lungs, right greater than left, concerning for possible edema or atypical inflammation. 3.4 cm left thyroid  nodule is noted. Recommend thyroid  US . (Ref: J Am Coll Radiol. 2015 Feb;12(2): 143-50). Multiple bilateral subacute rib fractures are noted. Aortic Atherosclerosis (ICD10-I70.0). Electronically Signed   By: Irving Mantle.D.  On: 01/03/2024 15:36   DG Chest Portable 1 View Result Date: 01/03/2024 CLINICAL DATA:  Chest pain EXAM: PORTABLE CHEST 1 VIEW COMPARISON:  Chest radiograph dated 12/10/2023 FINDINGS: Lines/tubes: Right internal jugular venous catheter tip projects over the superior cavoatrial junction. Lungs: Patient is rotated to the right. Bibasilar patchy opacities. Similar chronic-appearing perihilar pulmonary vasculature. Pleura: No pneumothorax or pleural effusion. Heart/mediastinum: Similar enlarged cardiomediastinal silhouette. Bones: No acute osseous abnormality. IMPRESSION: 1. Bibasilar patchy opacities, which may represent atelectasis, aspiration, or pneumonia. 2. Similar cardiomegaly. Electronically Signed   By: Limin  Xu M.D.   On: 01/03/2024 14:18   DG Ribs Unilateral W/Chest  Left Result Date: 12/10/2023 CLINICAL DATA:  Left rib pain after fall yesterday. EXAM: LEFT RIBS AND CHEST - 3+ VIEW COMPARISON:  November 07, 2023. FINDINGS: Minimally displaced fractures are seen involving the left sixth and seventh ribs. There is no evidence of pneumothorax or pleural effusion. Both lungs are clear. Stable cardiomediastinal silhouette. Right internal jugular dialysis catheter is noted. IMPRESSION: Minimally displaced left sixth and seventh rib fractures. Electronically Signed   By: Rosalene Colon M.D.   On: 12/10/2023 11:34   DG Hip Unilat W or Wo Pelvis 2-3 Views Left Result Date: 12/10/2023 CLINICAL DATA:  Status post fall with left hip pain EXAM: DG HIP (WITH OR WITHOUT PELVIS) 3V LEFT COMPARISON:  None Available. FINDINGS: There is no evidence of hip fracture or dislocation. There is no evidence of arthropathy or other focal bone abnormality. Subcutaneous soft tissue stranding overlying the left hip. IMPRESSION: 1. No acute fracture or dislocation. 2. Subcutaneous soft tissue stranding overlying the left hip, likely soft tissue contusion. Electronically Signed   By: Limin  Xu M.D.   On: 12/10/2023 11:33    Labs:  CBC: Recent Labs    01/03/24 1407 01/04/24 0702 01/06/24 0532 01/07/24 0705  WBC 1.3*  1.2* 15.0* 8.2 7.6  HGB 10.5*  10.3* 9.0* 9.2* 8.5*  HCT 32.5*  32.9* 27.9* 28.3* 25.7*  PLT 162  154 110* 134* 119*    COAGS: Recent Labs    03/22/23 1154 08/11/23 0544 08/11/23 0930  INR 1.1 >10.0* 1.0    BMP: Recent Labs    01/04/24 0529 01/05/24 1427 01/06/24 0532 01/07/24 0705  NA 136 134* 132* 134*  K 4.4 5.3* 3.8 4.1  CL 101 100 96* 98  CO2 23 20* 24 23  GLUCOSE 93 91 94 77  BUN 26* 41* 22 35*  CALCIUM  8.1* 8.5* 8.1* 8.2*  CREATININE 5.97* 7.51* 4.56* 6.05*  GFRNONAA 8* 6* 10* 7*    LIVER FUNCTION TESTS: Recent Labs    08/25/23 0054 11/07/23 1530 01/04/24 0529 01/05/24 1427  BILITOT 0.7 0.8 1.0 0.6  AST 17 57* 112* 38  ALT 9 49* 44  29  ALKPHOS 101 103 124 128*  PROT 5.9* 6.0* 6.3* 6.9  ALBUMIN  2.0* 2.3* 2.5* 2.8*     Assessment and Plan:  63 y.o. female inpatient. History of ESRD on HD, anemia of chronic disease. Presented to the ED at [facility] on 5.16.25 with fevers and chills. Found to be bacteremic. Blood cultures positive for candida. IR removed a RIJ tunneled Dialysis catheter on 5.19.25 for a line holiday. Team is requesting a replacement tunneled HD catheter  PLAN: IR Image Guided Tunneled HD Catheter Placement  Risks and benefits discussed with the patient including, but not limited to bleeding, infection, vascular injury, pneumothorax which may require chest tube placement, air embolism or even death  All of the  patient's questions were answered, patient is agreeable to proceed. Consent signed and in chart.   Thank you for this interesting consult.  I greatly enjoyed meeting Carol [name] and look forward to participating in their care.  A copy of this report was sent to the requesting provider on this date.  Electronically Signed: Marceil Sensor, NP 01/07/2024, 11:11 AM   I spent a total of 20 Minutes    in face to face in clinical consultation, greater than 50% of which was counseling/coordinating care for Tunneled HD catheter placement

## 2024-01-08 ENCOUNTER — Inpatient Hospital Stay (HOSPITAL_COMMUNITY)

## 2024-01-08 DIAGNOSIS — B377 Candidal sepsis: Secondary | ICD-10-CM | POA: Diagnosis not present

## 2024-01-08 DIAGNOSIS — R6520 Severe sepsis without septic shock: Secondary | ICD-10-CM

## 2024-01-08 HISTORY — PX: IR FLUORO GUIDE CV LINE RIGHT: IMG2283

## 2024-01-08 HISTORY — PX: IR US GUIDE VASC ACCESS RIGHT: IMG2390

## 2024-01-08 LAB — BASIC METABOLIC PANEL WITH GFR
Anion gap: 12 (ref 5–15)
BUN: 41 mg/dL — ABNORMAL HIGH (ref 8–23)
CO2: 21 mmol/L — ABNORMAL LOW (ref 22–32)
Calcium: 8.3 mg/dL — ABNORMAL LOW (ref 8.9–10.3)
Chloride: 98 mmol/L (ref 98–111)
Creatinine, Ser: 6.7 mg/dL — ABNORMAL HIGH (ref 0.44–1.00)
GFR, Estimated: 7 mL/min — ABNORMAL LOW (ref >60)
Glucose, Bld: 72 mg/dL (ref 70–99)
Potassium: 3.9 mmol/L (ref 3.5–5.1)
Sodium: 131 mmol/L — ABNORMAL LOW (ref 135–145)

## 2024-01-08 LAB — CBC
HCT: 27.3 % — ABNORMAL LOW (ref 36.0–46.0)
Hemoglobin: 8.9 g/dL — ABNORMAL LOW (ref 12.0–15.0)
MCH: 29.1 pg (ref 26.0–34.0)
MCHC: 32.6 g/dL (ref 30.0–36.0)
MCV: 89.2 fL (ref 80.0–100.0)
Platelets: 124 10*3/uL — ABNORMAL LOW (ref 150–400)
RBC: 3.06 MIL/uL — ABNORMAL LOW (ref 3.87–5.11)
RDW: 16.5 % — ABNORMAL HIGH (ref 11.5–15.5)
WBC: 6.4 10*3/uL (ref 4.0–10.5)
nRBC: 0 % (ref 0.0–0.2)

## 2024-01-08 LAB — MAGNESIUM: Magnesium: 1.9 mg/dL (ref 1.7–2.4)

## 2024-01-08 LAB — PHOSPHORUS: Phosphorus: 5.5 mg/dL — ABNORMAL HIGH (ref 2.5–4.6)

## 2024-01-08 MED ORDER — ANTICOAGULANT SODIUM CITRATE 4% (200MG/5ML) IV SOLN: 5.0000 mL | Status: DC | PRN

## 2024-01-08 MED ORDER — HEPARIN SODIUM (PORCINE) 1000 UNIT/ML DIALYSIS
1000.0000 [IU] | INTRAMUSCULAR | Status: DC | PRN
  Administered 2024-01-08: 3800 [IU]

## 2024-01-08 MED ORDER — LIDOCAINE-EPINEPHRINE 1 %-1:100000 IJ SOLN
20.0000 mL | Freq: Once | INTRAMUSCULAR | Status: AC
  Administered 2024-01-08: 10 mL via INTRADERMAL

## 2024-01-08 MED ORDER — HEPARIN SODIUM (PORCINE) 1000 UNIT/ML IJ SOLN
INTRAMUSCULAR | Status: AC
  Filled 2024-01-08: qty 1

## 2024-01-08 MED ORDER — PENTAFLUOROPROP-TETRAFLUOROETH EX AERO: 1.0000 | INHALATION_SPRAY | CUTANEOUS | Status: DC | PRN

## 2024-01-08 MED ORDER — ALTEPLASE 2 MG IJ SOLR: 2.0000 mg | Freq: Once | INTRAMUSCULAR | Status: DC | PRN

## 2024-01-08 MED ORDER — HEPARIN SODIUM (PORCINE) 1000 UNIT/ML DIALYSIS: 2000.0000 [IU] | INTRAMUSCULAR | Status: DC | PRN

## 2024-01-08 MED ORDER — LIDOCAINE HCL (PF) 1 % IJ SOLN: 5.0000 mL | INTRAMUSCULAR | Status: DC | PRN

## 2024-01-08 MED ORDER — VANCOMYCIN HCL IN DEXTROSE 1-5 GM/200ML-% IV SOLN
INTRAVENOUS | Status: AC | PRN
  Administered 2024-01-08: 1000 mg via INTRAVENOUS

## 2024-01-08 MED ORDER — MIDAZOLAM HCL 2 MG/2ML IJ SOLN
INTRAMUSCULAR | Status: AC | PRN
  Administered 2024-01-08: 1 mg via INTRAVENOUS
  Administered 2024-01-08: .5 mg via INTRAVENOUS

## 2024-01-08 MED ORDER — LIDOCAINE-PRILOCAINE 2.5-2.5 % EX CREA: 1.0000 | TOPICAL_CREAM | CUTANEOUS | Status: DC | PRN

## 2024-01-08 MED ORDER — VANCOMYCIN HCL IN DEXTROSE 1-5 GM/200ML-% IV SOLN
INTRAVENOUS | Status: AC
  Filled 2024-01-08: qty 200

## 2024-01-08 MED ORDER — OXYCODONE HCL 5 MG PO TABS
ORAL_TABLET | ORAL | Status: AC
  Filled 2024-01-08: qty 1

## 2024-01-08 MED ORDER — DIPHENHYDRAMINE HCL 50 MG/ML IJ SOLN
25.0000 mg | Freq: Four times a day (QID) | INTRAMUSCULAR | Status: DC | PRN
  Administered 2024-01-08 – 2024-01-15 (×9): 25 mg via INTRAVENOUS
  Filled 2024-01-08 (×9): qty 1

## 2024-01-08 MED ORDER — MIDAZOLAM HCL 2 MG/2ML IJ SOLN
INTRAMUSCULAR | Status: AC
  Filled 2024-01-08: qty 2

## 2024-01-08 MED ORDER — FENTANYL CITRATE (PF) 100 MCG/2ML IJ SOLN
INTRAMUSCULAR | Status: AC
  Filled 2024-01-08: qty 2

## 2024-01-08 MED ORDER — HEPARIN SODIUM (PORCINE) 1000 UNIT/ML IJ SOLN
INTRAMUSCULAR | Status: AC
  Filled 2024-01-08: qty 10

## 2024-01-08 MED ORDER — DIPHENHYDRAMINE HCL 50 MG/ML IJ SOLN
12.5000 mg | Freq: Once | INTRAMUSCULAR | Status: AC
  Administered 2024-01-08: 12.5 mg via INTRAVENOUS
  Filled 2024-01-08: qty 1

## 2024-01-08 MED ORDER — LIDOCAINE-EPINEPHRINE 1 %-1:100000 IJ SOLN
INTRAMUSCULAR | Status: AC
  Filled 2024-01-08: qty 1

## 2024-01-08 MED ORDER — FENTANYL CITRATE (PF) 100 MCG/2ML IJ SOLN
INTRAMUSCULAR | Status: AC | PRN
  Administered 2024-01-08 (×2): 25 ug via INTRAVENOUS

## 2024-01-08 NOTE — Procedures (Signed)
 Interventional Radiology Procedure Note  Procedure: Right internal jugular tunneled hemodialysis catheter placement   Findings: Please refer to procedural dictation for full description. 23 cm.  Complications: None immediate  Estimated Blood Loss: < 5 ml  Recommendations: Catheter ready for immediate use.   Creasie Doctor, MD

## 2024-01-08 NOTE — Progress Notes (Addendum)
 [name] Progress Note   Subjective:   Patient seen in her room this morning. She reports that she is feeling better today and less overwhelmed. She reports that her daughter and granddaughter are coming to visit her today and she is very excited about some company. IR placed temp catheter this morning. Plan for HD today. She does not appear to be volume overloaded and her K is stable today. She denies dyspnea, CP, and N/V.   Objective Vitals:   01/08/24 0815 01/08/24 0820 01/08/24 0825 01/08/24 0959  BP: (!) 186/73 (!) 151/75 (!) 155/83 (!) 151/67  Pulse: 64 64 64 65  Resp: 16 15 13 18   Temp:    98.4 F (36.9 C)  TempSrc:      SpO2: 98% 97% 94% 97%  Weight:      Height:       Physical Exam General: Alert female in NAD Heart: RRR, no murmrus, rubs or gallops Lungs: CTA bilaterally, respirations unlabored, on RA Abdomen: Soft, non-distended, +BS  Extremities: No edema b/l lower extremities Dialysis Access:  TDC removed 01/06/24, AVF + t/b but somewhat small, needs 2nd stage surgery for AVF. Temp catheter placed 01/08/24.   Additional Objective Labs: Basic Metabolic Panel: Recent Labs  Lab 01/06/24 0532 01/07/24 0705 01/08/24 0614  NA 132* 134* 131*  K 3.8 4.1 3.9  CL 96* 98 98  CO2 24 23 21*  GLUCOSE 94 77 72  BUN 22 35* 41*  CREATININE 4.56* 6.05* 6.70*  CALCIUM  8.1* 8.2* 8.3*  PHOS  --   --  5.5*   Liver Function Tests: Recent Labs  Lab 01/04/24 0529 01/05/24 1427  AST 112* 38  ALT 44 29  ALKPHOS 124 128*  BILITOT 1.0 0.6  PROT 6.3* 6.9  ALBUMIN  2.5* 2.8*   No results for input(s): "LIPASE", "AMYLASE" in the last 168 hours. CBC: Recent Labs  Lab 01/03/24 1407 01/04/24 0702 01/06/24 0532 01/07/24 0705 01/08/24 0614  WBC 1.3*  1.2* 15.0* 8.2 7.6 6.4  NEUTROABS 1.0*  --   --   --   --   HGB 10.5*  10.3* 9.0* 9.2* 8.5* 8.9*  HCT 32.5*  32.9* 27.9* 28.3* 25.7* 27.3*  MCV 91.5  91.6 92.1 89.0 88.9 89.2  PLT 162  154 110* 134*  119* 124*   Blood Culture    Component Value Date/Time   SDES BLOOD SITE NOT SPECIFIED 01/06/2024 2022   SDES BLOOD SITE NOT SPECIFIED 01/06/2024 2022   SPECREQUEST  01/06/2024 2022    BOTTLES DRAWN AEROBIC AND ANAEROBIC Blood Culture adequate volume   SPECREQUEST  01/06/2024 2022    BOTTLES DRAWN AEROBIC AND ANAEROBIC Blood Culture adequate volume   CULT  01/06/2024 2022    NO GROWTH 2 DAYS Performed at [facility], [address], [place], [place] [address]    CULT  01/06/2024 2022    NO GROWTH 2 DAYS Performed at [facility], [address] [place], [place] [address]    REPTSTATUS PENDING 01/06/2024 2022   REPTSTATUS PENDING 01/06/2024 2022    Cardiac Enzymes: No results for input(s): "CKTOTAL", "CKMB", "CKMBINDEX", "TROPONINI" in the last 168 hours. CBG: Recent Labs  Lab 01/04/24 0823 01/04/24 1210 01/04/24 1747 01/04/24 1822  GLUCAP 71 87 65* 76   Iron  Studies: No results for input(s): "IRON ", "TIBC", "TRANSFERRIN", "FERRITIN" in the last 72 hours. @lablastinr3 @ Studies/Results: No results found.  Medications:  anticoagulant sodium citrate      cefTRIAXone  (ROCEPHIN )  IV  2 g (01/08/24 1053)   micafungin  (MYCAMINE ) 100 mg in sodium chloride  0.9 % 100 mL IVPB 100 mg (01/07/24 2215)    (feeding supplement) PROSource Plus  30 mL Oral BID BM   amLODipine   10 mg Oral Daily   aspirin  EC  81 mg Oral Daily   atorvastatin   80 mg Oral Daily   Chlorhexidine  Gluconate Cloth  6 each Topical Q0600   Chlorhexidine  Gluconate Cloth  6 each Topical Q0600   darbepoetin (ARANESP ) injection - DIALYSIS  60 mcg Subcutaneous Q Wed-1800   gabapentin   100 mg Oral BID   heparin   5,000 Units Subcutaneous Q8H   metroNIDAZOLE   500 mg Oral Q12H   pantoprazole   40 mg Oral QHS    Dialysis Orders  [facility] M[facility]  4h   B500   70.5kg    TDC    Heparin  2000 Has been coming off 2-4kg over last 2 wks Had 30 min Rx on 5/16    Assessment/Plan: Fever / leukopenia: blood cultures  showing candidemia. ID on board. Needs line holiday. No fevers for past two days. WBC trending down on IV antibiotics.  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 On Flagyl  500 mg q 12 hours and Mycamine  100 mg q 24 hours.  ESRD: on HD MWF.  Last HD 01/05/24 with 3L UF. Temp cath placed on 01/08/24, and once she has negative BC, we can place [facility] and remove temp cath. Next HD 01/08/24 HTN: BP trending up, cont norvasc  and she has PRN hydralazine . UF will help with her BP Volume: trace bilateral edema in LE's Anemia of esrd: Hb 8.5, received mircera 75mcg on 5/5. ESA scheduled today. Holding IV Fe with infection  Possible cholecystitis: surgery consulted 01/06/24. CT of the chest/abdomen/pelvis shows atypical inflammatory changes of her lungs as well as gallstones with trace pericholecystic fluid. RUQ U/S shows stones, wall thickening, and pericholecystic fluid. Both of this studies also show a nodular liver contour concerning for possible cirrhosis  Secondary hyperparathyroidism: CCa 9.7, phos 5.5.  Nutrition: albumin  2.8 on renal diet. Started supplements.   Carol Lorenzo, NP-C 01/08/2024, 10:55 AM  [facility]   Seen and examined independently this AM.  Agree with note and exam as documented above by physician extender and as noted here.  Overall feels better  General adult female in bed in no acute distress HEENT normocephalic atraumatic extraocular movements intact sclera anicteric Neck supple trachea midline Lungs clear to auscultation bilaterally normal work of breathing at rest on room air Heart S1S2 no rub Abdomen soft nontender nondistended Extremities no pitting edema  Psych normal mood and affect Neuro - alert and oriented x 3 provides hx and follows commands  No access in at present (site bandaged)   Fungemia - s/p line holiday.  Nontunneled catheter today then once definitively cleared by ID we will coordinate a tunneled catheter - holding off right now.  Antifungals per ID.  Cultures from 5/19 NGTD (positive from 5/17)  ESRD - HD today after nontunneled catheter   HTN - controlled    Nan Aver, MD 01/08/2024  2:17 PM

## 2024-01-08 NOTE — Progress Notes (Signed)
Incentive spirometry introduced with teach back 

## 2024-01-08 NOTE — Plan of Care (Signed)
 Id brief note  Patient getting new line for dialysis today  5/19 bcx ngtd   A/p Fungemia, hd line related s/p line removal 5/20 need to r/o endocarditis   Tee set for tomorrow Patient on ceftriaxone /flagyl  per triad for concern of biliary infection    Continue micafungin  Defer to triad to manage ceftriaxone /flagyl  - I do not suspect cholecystitis Discussed with triad

## 2024-01-08 NOTE — Progress Notes (Signed)
 Patient was taken for the IR at 0730 am via bed

## 2024-01-08 NOTE — Progress Notes (Signed)
 [facility] night cross cover note:   I was notified by the patient's RN  that the patient is complaining of itching around the level of her groin, with RN conveying no overt associated rash or drainage. As she is NPO for IR procedure this AM, I ordered benadryl  12.5 mg iv x 1 dose now.     Camelia Cavalier, DO Hospitalist

## 2024-01-08 NOTE — Plan of Care (Signed)

## 2024-01-08 NOTE — Procedures (Signed)
 Seen and examined on dialysis.  Blood pressure 141/71 and HR 63.  Tolerating goal.  She had a tunneled catheter placed today with IR.    I spoke with IR on 5/20 - we requested a nontunneled dialysis catheter.  We do want to ensure cultures are clear prior.  I have reached out to ID about the same and have requested that the catheter be replaced with a nontunneled line   Nan Aver, MD 01/08/2024 3:39 PM

## 2024-01-08 NOTE — Progress Notes (Signed)
 Patient is back after the IR

## 2024-01-08 NOTE — Progress Notes (Signed)
 PROGRESS NOTE    Carol [name]  ZOX:[id number] DOB: 11-01-1960 DOA: 01/03/2024 PCP: [facility]   Brief Narrative: Carol [name] is a 63 y.o. female with a history of ESRD on hemodialysis, hyperlipidemia, GERD, pulm hypertension.  Patient presented secondary to upper chest pain and shortness of breath in addition to fever and chills and found to have evidence of sepsis secondary to candidemia.  Infectious ease consulted.  Hospitalization also complicated by evidence of cholelithiasis with possible cholelithiasis with possible cholecystitis; general surgery consulted and are following.   Assessment and Plan:  Sepsis Present on admission secondary to candidia tropicalis candidemia.  Candidemia In setting of HD cath. Noted on blood cultures (5/16 and 5/17). ID consulted. Patient managed on Micafungin . Prior to admission [facility] removed on 5/19 and new TDC placed on 5/21. -ID recommendations: Transesophageal Echocardiogram, micafungin  with plan to transition to fluconazole, ophthalmology exam  Cholelithiasis Possible cholecystitis General surgery consulted. Awaiting management/workup of candidemia prior to consideration of surgical management.  ESRD on hemodialysis Nephrology consulted for HD during hospitalization. Right internal jugular tunneled HD catheter removed on 5/19 and new right internal jugular tunneled HD catheter placed on 5/21.  Severe asymptomatic hypertension Improved with antihypertensive therapy and hemodialysis. -Continue amlodipine   Anemia of chronic disease Chronic and stable.  Thyroid  nodule Noted incidentally on CT scan imaging. Outpatient follow-up    DVT prophylaxis: Subcutaneous heparin  Code Status:   Code Status: Full Code Family Communication: None at bedside Disposition Plan: Discharge pending continued ID/general surgery recommendations/management   Consultants:  Nephrology Infectious disease General  surgery Interventional radiology  Procedures:  TDC removal TDC placement Hemodialysis  Antimicrobials: Vancomycin Micafungin  Flagyl  Ceftriaxone  Azithromycin     Subjective: Patient reports no concerns this morning. Just back from [facility] placement by IR. No dyspnea. Some vaginal discomfort without discharge.  Objective: BP (!) 155/83   Pulse 64   Temp 98.9 F (37.2 C) (Oral)   Resp 13   Ht 5\' 4"  (1.626 m)   Wt 66.7 kg   SpO2 94%   BMI 25.23 kg/m   Examination:  General exam: Appears calm and comfortable Respiratory system: Clear to auscultation. Respiratory effort normal. Cardiovascular system: S1 & S2 heard, RRR. No murmurs, rubs, gallops or clicks. Gastrointestinal system: Abdomen is nondistended, soft and nontender. Normal bowel sounds heard. Central nervous system: Alert and oriented. No focal neurological deficits. Musculoskeletal: No edema. No calf tenderness Psychiatry: Judgement and insight appear normal. Mood & affect appropriate.    Data Reviewed: I have personally reviewed following labs and imaging studies  CBC Lab Results  Component Value Date   WBC 6.4 01/08/2024   RBC 3.06 (L) 01/08/2024   HGB 8.9 (L) 01/08/2024   HCT 27.3 (L) 01/08/2024   MCV 89.2 01/08/2024   MCH 29.1 01/08/2024   PLT 124 (L) 01/08/2024   MCHC 32.6 01/08/2024   RDW 16.5 (H) 01/08/2024   LYMPHSABS 0.2 (L) 01/03/2024   MONOABS 0.0 (L) 01/03/2024   EOSABS 0.1 01/03/2024   BASOSABS 0.0 01/03/2024     Last metabolic panel Lab Results  Component Value Date   NA 131 (L) 01/08/2024   K 3.9 01/08/2024   CL 98 01/08/2024   CO2 21 (L) 01/08/2024   BUN 41 (H) 01/08/2024   CREATININE 6.70 (H) 01/08/2024   GLUCOSE 72 01/08/2024   GFRNONAA 7 (L) 01/08/2024   GFRAA 50 (L) 11/21/2020   CALCIUM  8.3 (L) 01/08/2024   PHOS 5.5 (H) 01/08/2024   PROT 6.9 01/05/2024  ALBUMIN  2.8 (L) 01/05/2024   LABGLOB 3.0 03/24/2023   AGRATIO 1.0 03/24/2023   BILITOT 0.6 01/05/2024   ALKPHOS  128 (H) 01/05/2024   AST 38 01/05/2024   ALT 29 01/05/2024   ANIONGAP 12 01/08/2024    GFR: Estimated Creatinine Clearance: 8.3 mL/min (A) (by C-G formula based on SCr of 6.7 mg/dL (H)).  Recent Results (from the past 240 hours)  Resp panel by RT-PCR (RSV, Flu A&B, Covid) Anterior Nasal Swab     Status: None   Collection Time: 01/03/24  1:33 PM   Specimen: Anterior Nasal Swab  Result Value Ref Range Status   SARS Coronavirus 2 by RT PCR NEGATIVE NEGATIVE Final   Influenza A by PCR NEGATIVE NEGATIVE Final   Influenza B by PCR NEGATIVE NEGATIVE Final    Comment: (NOTE) The Xpert Xpress SARS-CoV-2/FLU/RSV plus assay is intended as an aid in the diagnosis of influenza from Nasopharyngeal swab specimens and should not be used as a sole basis for treatment. Nasal washings and aspirates are unacceptable for Xpert Xpress SARS-CoV-2/FLU/RSV testing.  Fact Sheet for Patients: BloggerCourse.com  Fact Sheet for Healthcare Providers: SeriousBroker.it  This test is not yet approved or cleared by the [place]  FDA and has been authorized for detection and/or diagnosis of SARS-CoV-2 by FDA under an Emergency Use Authorization (EUA). This EUA will remain in effect (meaning this test can be used) for the duration of the COVID-19 declaration under Section 564(b)(1) of the Act, 21 U.S.C. section 360bbb-3(b)(1), unless the authorization is terminated or revoked.     Resp Syncytial Virus by PCR NEGATIVE NEGATIVE Final    Comment: (NOTE) Fact Sheet for Patients: BloggerCourse.com  Fact Sheet for Healthcare Providers: SeriousBroker.it  This test is not yet approved or cleared by the [place]  FDA and has been authorized for detection and/or diagnosis of SARS-CoV-2 by FDA under an Emergency Use Authorization (EUA). This EUA will remain in effect (meaning this test can be used) for the  duration of the COVID-19 declaration under Section 564(b)(1) of the Act, 21 U.S.C. section 360bbb-3(b)(1), unless the authorization is terminated or revoked.  Performed at [facility], [address], [place], [place] [address]   Blood culture (routine x 2)     Status: Abnormal (Preliminary result)   Collection Time: 01/03/24  2:59 PM   Specimen: BLOOD RIGHT FOREARM  Result Value Ref Range Status   Specimen Description BLOOD RIGHT FOREARM  Final   Special Requests   Final    BOTTLES DRAWN AEROBIC AND ANAEROBIC Blood Culture adequate volume   Culture  Setup Time   Final    YEAST IN BOTH AEROBIC AND ANAEROBIC BOTTLES CRITICAL RESULT CALLED TO, READ BACK BY AND VERIFIED WITH: PHARMD AUSTIN PAYTES [id number] 1938 BY J RAZZAK, MT    Culture (A)  Final    CANDIDA TROPICALIS Sent to [facility] for further susceptibility testing. Performed at [facility], [address], [place], [place] [address]    Report Status PENDING  Incomplete  Blood Culture ID Panel (Reflexed)     Status: Abnormal   Collection Time: 01/03/24  2:59 PM  Result Value Ref Range Status   Enterococcus faecalis NOT DETECTED NOT DETECTED Final   Enterococcus Faecium NOT DETECTED NOT DETECTED Final   Listeria monocytogenes NOT DETECTED NOT DETECTED Final   Staphylococcus species NOT DETECTED NOT DETECTED Final   Staphylococcus aureus (BCID) NOT DETECTED NOT DETECTED Final   Staphylococcus epidermidis NOT DETECTED NOT DETECTED Final  Staphylococcus lugdunensis NOT DETECTED NOT DETECTED Final   Streptococcus species NOT DETECTED NOT DETECTED Final   Streptococcus agalactiae NOT DETECTED NOT DETECTED Final   Streptococcus pneumoniae NOT DETECTED NOT DETECTED Final   Streptococcus pyogenes NOT DETECTED NOT DETECTED Final   A.calcoaceticus-baumannii NOT DETECTED NOT DETECTED Final   Bacteroides fragilis NOT DETECTED NOT DETECTED Final   Enterobacterales NOT DETECTED NOT DETECTED Final   Enterobacter cloacae  complex NOT DETECTED NOT DETECTED Final   Escherichia coli NOT DETECTED NOT DETECTED Final   Klebsiella aerogenes NOT DETECTED NOT DETECTED Final   Klebsiella oxytoca NOT DETECTED NOT DETECTED Final   Klebsiella pneumoniae NOT DETECTED NOT DETECTED Final   Proteus species NOT DETECTED NOT DETECTED Final   Salmonella species NOT DETECTED NOT DETECTED Final   Serratia marcescens NOT DETECTED NOT DETECTED Final   Haemophilus influenzae NOT DETECTED NOT DETECTED Final   Neisseria meningitidis NOT DETECTED NOT DETECTED Final   Pseudomonas aeruginosa NOT DETECTED NOT DETECTED Final   Stenotrophomonas maltophilia NOT DETECTED NOT DETECTED Final   Candida albicans NOT DETECTED NOT DETECTED Final   Candida auris NOT DETECTED NOT DETECTED Final   Candida glabrata NOT DETECTED NOT DETECTED Final   Candida krusei NOT DETECTED NOT DETECTED Final   Candida parapsilosis NOT DETECTED NOT DETECTED Final   Candida tropicalis DETECTED (A) NOT DETECTED Final    Comment: CRITICAL RESULT CALLED TO, READ BACK BY AND VERIFIED WITH: PHARMD AUSTIN PAYTES [id number] 1938 BY J RAZZAK, MT    Cryptococcus neoformans/gattii NOT DETECTED NOT DETECTED Final    Comment: Performed at [facility], [address], [place], [place] [address]  Blood culture (routine x 2)     Status: Abnormal   Collection Time: 01/04/24  5:39 AM   Specimen: BLOOD RIGHT ARM  Result Value Ref Range Status   Specimen Description BLOOD RIGHT ARM  Final   Special Requests   Final    BOTTLES DRAWN AEROBIC AND ANAEROBIC Blood Culture adequate volume   Culture  Setup Time   Final    BUDDING YEAST SEEN AEROBIC BOTTLE ONLY Gram Stain Report Called to,Read Back By and Verified With: PHARMD J LEDFORD 01/06/2024 @ 0059 BY AB Performed at [facility], [address] [place], [place] [address]    Culture CANDIDA TROPICALIS (A)  Final   Report Status 01/07/2024 FINAL  Final  Respiratory (~20 pathogens) panel by PCR     Status: None    Collection Time: 01/04/24 11:35 PM   Specimen: Nasopharyngeal Swab; Respiratory  Result Value Ref Range Status   Adenovirus NOT DETECTED NOT DETECTED Final   Coronavirus 229E NOT DETECTED NOT DETECTED Final    Comment: (NOTE) The Coronavirus on the Respiratory Panel, DOES NOT test for the novel  Coronavirus (2019 nCoV)    Coronavirus HKU1 NOT DETECTED NOT DETECTED Final   Coronavirus NL63 NOT DETECTED NOT DETECTED Final   Coronavirus OC43 NOT DETECTED NOT DETECTED Final   Metapneumovirus NOT DETECTED NOT DETECTED Final   Rhinovirus / Enterovirus NOT DETECTED NOT DETECTED Final   Influenza A NOT DETECTED NOT DETECTED Final   Influenza B NOT DETECTED NOT DETECTED Final   Parainfluenza Virus 1 NOT DETECTED NOT DETECTED Final   Parainfluenza Virus 2 NOT DETECTED NOT DETECTED Final   Parainfluenza Virus 3 NOT DETECTED NOT DETECTED Final   Parainfluenza Virus 4 NOT DETECTED NOT DETECTED Final   Respiratory Syncytial Virus NOT DETECTED NOT DETECTED Final   Bordetella pertussis NOT DETECTED NOT  DETECTED Final   Bordetella Parapertussis NOT DETECTED NOT DETECTED Final   Chlamydophila pneumoniae NOT DETECTED NOT DETECTED Final   Mycoplasma pneumoniae NOT DETECTED NOT DETECTED Final    Comment: Performed at [facility], [address], [place], [place] [address]  Culture, blood (Routine X 2) w Reflex to ID Panel     Status: None (Preliminary result)   Collection Time: 01/06/24  8:13 AM   Specimen: BLOOD  Result Value Ref Range Status   Specimen Description BLOOD SITE NOT SPECIFIED  Final   Special Requests   Final    BOTTLES DRAWN AEROBIC AND ANAEROBIC Blood Culture results may not be optimal due to an inadequate volume of blood received in culture bottles   Culture   Final    NO GROWTH < 24 HOURS Performed at [facility], [address] [place], [place] [address]    Report Status PENDING  Incomplete  Culture, blood (Routine X 2) w Reflex to ID Panel     Status: None  (Preliminary result)   Collection Time: 01/06/24  8:13 AM   Specimen: BLOOD  Result Value Ref Range Status   Specimen Description BLOOD SITE NOT SPECIFIED  Final   Special Requests   Final    BOTTLES DRAWN AEROBIC AND ANAEROBIC Blood Culture results may not be optimal due to an inadequate volume of blood received in culture bottles   Culture   Final    NO GROWTH < 24 HOURS Performed at [facility], [address] [place], [place] [address]    Report Status PENDING  Incomplete  Culture, blood (Routine X 2) w Reflex to ID Panel     Status: None (Preliminary result)   Collection Time: 01/06/24  8:22 PM   Specimen: BLOOD  Result Value Ref Range Status   Specimen Description BLOOD SITE NOT SPECIFIED  Final   Special Requests   Final    BOTTLES DRAWN AEROBIC AND ANAEROBIC Blood Culture adequate volume   Culture   Final    NO GROWTH 2 DAYS Performed at [facility], [address], [place], [place] [address]    Report Status PENDING  Incomplete  Culture, blood (Routine X 2) w Reflex to ID Panel     Status: None (Preliminary result)   Collection Time: 01/06/24  8:22 PM   Specimen: BLOOD  Result Value Ref Range Status   Specimen Description BLOOD SITE NOT SPECIFIED  Final   Special Requests   Final    BOTTLES DRAWN AEROBIC AND ANAEROBIC Blood Culture adequate volume   Culture   Final    NO GROWTH 2 DAYS Performed at [facility], [address] [place], [place] [address]    Report Status PENDING  Incomplete      Radiology Studies: IR Removal Tun Cv Cath W/O FL Result Date: 01/06/2024 INDICATION: End-stage renal disease on hemodialysis. Candidemia. Request removal of tunneled HD catheter. Catheter originally placed by surgical service 07/2023 EXAM: REMOVAL OF TUNNELED RIGHT IJ HEMODIALYSIS CATHETER MEDICATIONS: 1% plain lidocaine , 8 mL COMPLICATIONS: None immediate. PROCEDURE: Informed written consent was obtained from the patient following an explanation of the  procedure, risks, benefits and alternatives to treatment. A time out was performed prior to the initiation of the procedure. Maximal barrier sterile technique was utilized including caps, mask, sterile gowns, sterile gloves, large sterile drape, hand hygiene, and chlorhexidine . 1% lidocaine  was injected under sterile conditions along the subcutaneous tunnel. Utilizing a combination of  blunt dissection and gentle traction, the cuff of the catheter was exposed and the catheter was removed intact. Hemostasis was obtained with manual compression. A dressing was placed. The patient tolerated the procedure well without immediate post procedural complication. IMPRESSION: Successful removal of tunneled right IJ hemodialysis catheter. Procedure performed by Prudence Brown PA-C supervised by Dr. Lowanda Ruddy Electronically Signed   By: Erica Hau M.D.   On: 01/06/2024 09:37      LOS: 5 days    Aneita Keens, MD [facility] 01/08/2024, 9:25 AM   If 7PM-7AM, please contact night-coverage [email or web address]

## 2024-01-08 NOTE — Plan of Care (Signed)
  Problem: Education: Goal: Knowledge of General Education information will improve Description: Including pain rating scale, medication(s)/side effects and non-pharmacologic comfort measures Outcome: Progressing   Problem: Clinical Measurements: Goal: Will remain free from infection Outcome: Progressing Goal: Respiratory complications will improve Outcome: Progressing   Problem: Activity: Goal: Risk for activity intolerance will decrease Outcome: Progressing   Problem: Nutrition: Goal: Adequate nutrition will be maintained Outcome: Progressing   Problem: Coping: Goal: Level of anxiety will decrease Outcome: Progressing   Problem: Pain Managment: Goal: General experience of comfort will improve and/or be controlled Outcome: Progressing

## 2024-01-08 NOTE — Procedures (Signed)
 Received patient in bed to unit.  Alert and oriented.  Informed consent signed and in chart.   TX duration: 3.5 hours  Patient tolerated well.  Transported back to the room  Alert, without acute distress.  Hand-off given to patient's nurse.   Access used: right cath Access issues: none  Total UF removed: 2 liters Medication(s) given: heparin , oxycodone   Carol Dao, RN [facility]

## 2024-01-08 NOTE — Hospital Course (Addendum)
 Carol [name] is a 63 y.o. female with a history of ESRD on hemodialysis, hyperlipidemia, GERD, pulm hypertension.  Patient presented secondary to upper chest pain and shortness of breath in addition to fever and chills and found to have evidence of sepsis secondary to candidemia.  Infectious ease consulted.  Hospitalization also complicated by evidence of cholelithiasis s/p laparoscopic, converted to open, cholecystectomy on 5/23.  Repeat blood cultures (5/19) growing GNRs. Antibiotics regimen adjusted per ID recommendations.

## 2024-01-08 NOTE — Progress Notes (Signed)
 Patient was off the floor for iHD line placement.  She had fungemia at presentation and is now s/p line removal and replacement. She is scheduled for a TEE tomorrow.  Her exam and history are concerning for possible GB disease, but I do not think that this would explain her fungemia or overall clinical picture. - Surgery will follow in the periphery for now  - Recommendations regarding her GB (cholecystostomy vs cholecystectomy) pending echo and hospital course - Please reach out with any questions/concerns  Armond Bertin, MD   General Surgeon [facility], [place]

## 2024-01-08 NOTE — Progress Notes (Signed)
 Patient was taken for the dialysis.

## 2024-01-09 ENCOUNTER — Inpatient Hospital Stay (HOSPITAL_COMMUNITY)

## 2024-01-09 ENCOUNTER — Ambulatory Visit: Payer: Self-pay | Admitting: General Surgery

## 2024-01-09 ENCOUNTER — Inpatient Hospital Stay (HOSPITAL_COMMUNITY): Admitting: Anesthesiology

## 2024-01-09 ENCOUNTER — Encounter (HOSPITAL_COMMUNITY)
Admission: EM | Disposition: A | Discharge status: Home Health | Payer: Self-pay | Source: Home / Self Care | Attending: Family Medicine

## 2024-01-09 ENCOUNTER — Encounter (HOSPITAL_COMMUNITY): Payer: Self-pay | Admitting: Internal Medicine

## 2024-01-09 DIAGNOSIS — I340 Nonrheumatic mitral (valve) insufficiency: Secondary | ICD-10-CM | POA: Diagnosis not present

## 2024-01-09 DIAGNOSIS — I5043 Acute on chronic combined systolic (congestive) and diastolic (congestive) heart failure: Secondary | ICD-10-CM | POA: Diagnosis not present

## 2024-01-09 DIAGNOSIS — I110 Hypertensive heart disease with heart failure: Secondary | ICD-10-CM | POA: Diagnosis not present

## 2024-01-09 DIAGNOSIS — I081 Rheumatic disorders of both mitral and tricuspid valves: Secondary | ICD-10-CM | POA: Diagnosis not present

## 2024-01-09 DIAGNOSIS — B377 Candidal sepsis: Secondary | ICD-10-CM | POA: Diagnosis not present

## 2024-01-09 DIAGNOSIS — I2510 Atherosclerotic heart disease of native coronary artery without angina pectoris: Secondary | ICD-10-CM | POA: Diagnosis not present

## 2024-01-09 DIAGNOSIS — R6520 Severe sepsis without septic shock: Secondary | ICD-10-CM | POA: Diagnosis not present

## 2024-01-09 HISTORY — PX: TRANSESOPHAGEAL ECHOCARDIOGRAM (CATH LAB): EP1270

## 2024-01-09 LAB — BLOOD CULTURE ID PANEL (REFLEXED) - BCID2

## 2024-01-09 LAB — RENAL FUNCTION PANEL
Albumin: 2.5 g/dL — ABNORMAL LOW (ref 3.5–5.0)
Anion gap: 12 (ref 5–15)
BUN: 22 mg/dL (ref 8–23)
CO2: 22 mmol/L (ref 22–32)
Calcium: 8.7 mg/dL — ABNORMAL LOW (ref 8.9–10.3)
Chloride: 100 mmol/L (ref 98–111)
Creatinine, Ser: 4.41 mg/dL — ABNORMAL HIGH (ref 0.44–1.00)
GFR, Estimated: 11 mL/min — ABNORMAL LOW (ref >60)
Glucose, Bld: 71 mg/dL (ref 70–99)
Phosphorus: 3.6 mg/dL (ref 2.5–4.6)
Potassium: 4 mmol/L (ref 3.5–5.1)
Sodium: 134 mmol/L — ABNORMAL LOW (ref 135–145)

## 2024-01-09 LAB — CBC
HCT: 27.6 % — ABNORMAL LOW (ref 36.0–46.0)
Hemoglobin: 9.1 g/dL — ABNORMAL LOW (ref 12.0–15.0)
MCH: 29.3 pg (ref 26.0–34.0)
MCHC: 33 g/dL (ref 30.0–36.0)
MCV: 88.7 fL (ref 80.0–100.0)
Platelets: 136 10*3/uL — ABNORMAL LOW (ref 150–400)
RBC: 3.11 MIL/uL — ABNORMAL LOW (ref 3.87–5.11)
RDW: 16.4 % — ABNORMAL HIGH (ref 11.5–15.5)
WBC: 5.5 10*3/uL (ref 4.0–10.5)
nRBC: 0 % (ref 0.0–0.2)

## 2024-01-09 LAB — PROTIME-INR
INR: 1.1 (ref 0.8–1.2)
Prothrombin Time: 14.4 s (ref 11.4–15.2)

## 2024-01-09 LAB — ECHO TEE

## 2024-01-09 MED ORDER — CHLORHEXIDINE GLUCONATE CLOTH 2 % EX PADS
6.0000 | MEDICATED_PAD | Freq: Once | CUTANEOUS | Status: AC
Start: 2024-01-09 — End: 2024-01-09
  Administered 2024-01-09: 6 via TOPICAL

## 2024-01-09 MED ORDER — CHLORHEXIDINE GLUCONATE CLOTH 2 % EX PADS
6.0000 | MEDICATED_PAD | Freq: Once | CUTANEOUS | Status: AC
  Administered 2024-01-09: 6 via TOPICAL

## 2024-01-09 MED ORDER — LIDOCAINE 2% (20 MG/ML) 5 ML SYRINGE
INTRAMUSCULAR | Status: DC | PRN
  Administered 2024-01-09: 100 mg via INTRAVENOUS

## 2024-01-09 MED ORDER — SODIUM CHLORIDE 0.9% FLUSH: 3.0000 mL | INTRAVENOUS | Status: DC | PRN

## 2024-01-09 MED ORDER — SODIUM CHLORIDE 0.9% FLUSH
3.0000 mL | Freq: Two times a day (BID) | INTRAVENOUS | Status: DC
  Administered 2024-01-09: 3 mL via INTRAVENOUS

## 2024-01-09 MED ORDER — PROPOFOL 500 MG/50ML IV EMUL
INTRAVENOUS | Status: DC | PRN
  Administered 2024-01-09: 110 ug/kg/min via INTRAVENOUS

## 2024-01-09 MED ORDER — DARBEPOETIN ALFA 100 MCG/0.5ML IJ SOSY
100.0000 ug | PREFILLED_SYRINGE | INTRAMUSCULAR | Status: DC
  Administered 2024-01-15: 100 ug via SUBCUTANEOUS
  Filled 2024-01-09: qty 0.5

## 2024-01-09 MED ORDER — CHLORHEXIDINE GLUCONATE CLOTH 2 % EX PADS
6.0000 | MEDICATED_PAD | Freq: Every day | CUTANEOUS | Status: DC
  Administered 2024-01-10 – 2024-01-12 (×3): 6 via TOPICAL

## 2024-01-09 MED ORDER — LOSARTAN POTASSIUM 50 MG PO TABS
25.0000 mg | ORAL_TABLET | Freq: Every day | ORAL | Status: DC
  Administered 2024-01-09 – 2024-01-11 (×2): 25 mg via ORAL
  Filled 2024-01-09 (×3): qty 1

## 2024-01-09 MED ORDER — GLYCOPYRROLATE PF 0.2 MG/ML IJ SOSY
PREFILLED_SYRINGE | INTRAMUSCULAR | Status: DC | PRN
  Administered 2024-01-09: .2 mg via INTRAVENOUS

## 2024-01-09 MED ORDER — PROPOFOL 10 MG/ML IV BOLUS
INTRAVENOUS | Status: DC | PRN
  Administered 2024-01-09: 20 mg via INTRAVENOUS
  Administered 2024-01-09: 10 mg via INTRAVENOUS
  Administered 2024-01-09: 50 mg via INTRAVENOUS

## 2024-01-09 NOTE — Progress Notes (Signed)
 PHARMACY - PHYSICIAN COMMUNICATION CRITICAL VALUE ALERT - BLOOD CULTURE IDENTIFICATION (BCID)  Carol [name] is an 63 y.o. female who presented to [facility] on 01/03/2024 with a chief complaint of CP/SOB at HD.  Assessment:  Started on ABX for sepsis, found to have fungemia and started on micafungin , also with concern for cholecystitis and on ABX for that, now growing GNR in 1 of 8 blood cx bottles; ID is on board.  Name of physician Contacted: SSundil MD  Current antibiotics: ceftriaxone  and metronidazole   Changes to prescribed antibiotics recommended:  Patient is on recommended antibiotics - No changes needed   Lonnie Roberts, PharmD, BCPS  01/09/2024  1:34 AM

## 2024-01-09 NOTE — Progress Notes (Addendum)
 [name] Progress Note   Subjective:   Patient seen in her room this morning. She states that "today is not a good day for her, but I'm trying to hang in." She had HD yesterday and they were able to remove 2L with UF. She states that she did not have any complications with HD. She has new TDC that was placed yesterday. ID is on board with her and we are tracking her blood cultures. Next HD 01/10/24  BP has been high. Start ARB today to help with her BP.   Objective Vitals:   01/08/24 2333 01/09/24 0041 01/09/24 0500 01/09/24 0532  BP: (!) 192/82 (!) 175/83  (!) 189/81  Pulse:  73  72  Resp:    16  Temp:    99 F (37.2 C)  TempSrc:      SpO2:  95%  96%  Weight:   76.5 kg   Height:       Physical Exam General: Alert female in NAD Heart: RRR, no murmrus, rubs or gallops Lungs: CTA bilaterally, respirations unlabored, on RA Abdomen: Soft, non-distended, +BS  Extremities: No edema b/l lower extremities Dialysis Access:  TDC removed 01/06/24, AVF + t/b but somewhat small, needs 2nd stage surgery for AVF. Temp catheter placed 01/08/24.   Additional Objective Labs: Basic Metabolic Panel: Recent Labs  Lab 01/06/24 0532 01/07/24 0705 01/08/24 0614  NA 132* 134* 131*  K 3.8 4.1 3.9  CL 96* 98 98  CO2 24 23 21*  GLUCOSE 94 77 72  BUN 22 35* 41*  CREATININE 4.56* 6.05* 6.70*  CALCIUM  8.1* 8.2* 8.3*  PHOS  --   --  5.5*   Liver Function Tests: Recent Labs  Lab 01/04/24 0529 01/05/24 1427  AST 112* 38  ALT 44 29  ALKPHOS 124 128*  BILITOT 1.0 0.6  PROT 6.3* 6.9  ALBUMIN  2.5* 2.8*   No results for input(s): "LIPASE", "AMYLASE" in the last 168 hours. CBC: Recent Labs  Lab 01/03/24 1407 01/04/24 0702 01/06/24 0532 01/07/24 0705 01/08/24 0614  WBC 1.3*  1.2* 15.0* 8.2 7.6 6.4  NEUTROABS 1.0*  --   --   --   --   HGB 10.5*  10.3* 9.0* 9.2* 8.5* 8.9*  HCT 32.5*  32.9* 27.9* 28.3* 25.7* 27.3*  MCV 91.5  91.6 92.1 89.0 88.9 89.2  PLT 162  154  110* 134* 119* 124*   Blood Culture    Component Value Date/Time   SDES BLOOD SITE NOT SPECIFIED 01/06/2024 2022   SDES BLOOD SITE NOT SPECIFIED 01/06/2024 2022   SPECREQUEST  01/06/2024 2022    BOTTLES DRAWN AEROBIC AND ANAEROBIC Blood Culture adequate volume   SPECREQUEST  01/06/2024 2022    BOTTLES DRAWN AEROBIC AND ANAEROBIC Blood Culture adequate volume   CULT  01/06/2024 2022    NO GROWTH 3 DAYS Performed at [facility], [address], [place], [place] [address]    CULT  01/06/2024 2022    NO GROWTH 3 DAYS Performed at [facility], [address] [place], [place] [address]    REPTSTATUS PENDING 01/06/2024 2022   REPTSTATUS PENDING 01/06/2024 2022    Cardiac Enzymes: No results for input(s): "CKTOTAL", "CKMB", "CKMBINDEX", "TROPONINI" in the last 168 hours. CBG: Recent Labs  Lab 01/04/24 0823 01/04/24 1210 01/04/24 1747 01/04/24 1822  GLUCAP 71 87 65* 76   Iron  Studies: No results for input(s): "IRON ", "TIBC", "TRANSFERRIN", "FERRITIN" in the last 72 hours. @lablastinr3 @ Studies/Results: IR  Fluoro Guide CV Line Right Result Date: 01/08/2024 INDICATION: 63 year old female with history of end-stage renal disease on hemodialysis presenting for replacement of tunneled hemodialysis catheter after line holiday. EXAM: TUNNELED CENTRAL VENOUS HEMODIALYSIS CATHETER PLACEMENT WITH ULTRASOUND AND FLUOROSCOPIC GUIDANCE MEDICATIONS: Vancomycin 1 gm IV . The antibiotic was given in an appropriate time interval prior to skin puncture. ANESTHESIA/SEDATION: Moderate (conscious) sedation was employed during this procedure. A total of Versed  1.5 mg and Fentanyl  50 mcg was administered intravenously. Moderate Sedation Time: 11 minutes. The patient's level of consciousness and vital signs were monitored continuously by radiology nursing throughout the procedure under my direct supervision. FLUOROSCOPY TIME:  Nine mGy reference air kerma COMPLICATIONS: None immediate. PROCEDURE:  Informed written consent was obtained from the patient after a discussion of the risks, benefits, and alternatives to treatment. Questions regarding the procedure were encouraged and answered. The right neck and chest were prepped with chlorhexidine  in a sterile fashion, and a sterile drape was applied covering the operative field. Maximum barrier sterile technique with sterile gowns and gloves were used for the procedure. A timeout was performed prior to the initiation of the procedure. After creating a small venotomy incision, a 21 gauge micropuncture kit was utilized to access the internal jugular vein. Real-time ultrasound guidance was utilized for vascular access including the acquisition of a permanent ultrasound image documenting patency of the accessed vessel. A Rosen wire was advanced to the level of the IVC and the micropuncture sheath was exchanged for an 8 Fr dilator. A 14.5 French tunneled hemodialysis catheter measuring 23 cm from tip to cuff was tunneled in a retrograde fashion from the anterior chest wall to the venotomy incision. Serial dilation was then performed an a peel-away sheath was placed. The catheter was then placed through the peel-away sheath with the catheter tip ultimately positioned within the right atrium. Final catheter positioning was confirmed and documented with a spot radiographic image. The catheter aspirates and flushes normally. The catheter was flushed with appropriate volume heparin  dwells. The catheter exit site was secured with a 0-Silk retention suture. The venotomy incision was closed with Dermabond. Sterile dressings were applied. The patient tolerated the procedure well without immediate post procedural complication. IMPRESSION: Successful placement of 23 cm tip to cuff tunneled hemodialysis catheter via the right internal jugular vein with catheter tip terminating within the right atrium. The catheter is ready for immediate use. Carol Doctor, MD [facility] Radiology Electronically Signed   By: Carol [name] M.D.   On: 01/08/2024 12:15   IR US  Guide Vasc Access Right Result Date: 01/08/2024 INDICATION: 63 year old female with history of end-stage renal disease on hemodialysis presenting for replacement of tunneled hemodialysis catheter after line holiday. EXAM: TUNNELED CENTRAL VENOUS HEMODIALYSIS CATHETER PLACEMENT WITH ULTRASOUND AND FLUOROSCOPIC GUIDANCE MEDICATIONS: Vancomycin 1 gm IV . The antibiotic was given in an appropriate time interval prior to skin puncture. ANESTHESIA/SEDATION: Moderate (conscious) sedation was employed during this procedure. A total of Versed  1.5 mg and Fentanyl  50 mcg was administered intravenously. Moderate Sedation Time: 11 minutes. The patient's level of consciousness and vital signs were monitored continuously by radiology nursing throughout the procedure under my direct supervision. FLUOROSCOPY TIME:  Nine mGy reference air kerma COMPLICATIONS: None immediate. PROCEDURE: Informed written consent was obtained from the patient after a discussion of the risks, benefits, and alternatives to treatment. Questions regarding the procedure were encouraged and answered. The right neck and chest were prepped with chlorhexidine  in a sterile fashion, and a  sterile drape was applied covering the operative field. Maximum barrier sterile technique with sterile gowns and gloves were used for the procedure. A timeout was performed prior to the initiation of the procedure. After creating a small venotomy incision, a 21 gauge micropuncture kit was utilized to access the internal jugular vein. Real-time ultrasound guidance was utilized for vascular access including the acquisition of a permanent ultrasound image documenting patency of the accessed vessel. A Rosen wire was advanced to the level of the IVC and the micropuncture sheath was exchanged for an 8 Fr dilator. A 14.5 French tunneled hemodialysis  catheter measuring 23 cm from tip to cuff was tunneled in a retrograde fashion from the anterior chest wall to the venotomy incision. Serial dilation was then performed an a peel-away sheath was placed. The catheter was then placed through the peel-away sheath with the catheter tip ultimately positioned within the right atrium. Final catheter positioning was confirmed and documented with a spot radiographic image. The catheter aspirates and flushes normally. The catheter was flushed with appropriate volume heparin  dwells. The catheter exit site was secured with a 0-Silk retention suture. The venotomy incision was closed with Dermabond. Sterile dressings were applied. The patient tolerated the procedure well without immediate post procedural complication. IMPRESSION: Successful placement of 23 cm tip to cuff tunneled hemodialysis catheter via the right internal jugular vein with catheter tip terminating within the right atrium. The catheter is ready for immediate use. Carol Doctor, MD [facility] Radiology Electronically Signed   By: Carol [name] M.D.   On: 01/08/2024 12:15    Medications:  cefTRIAXone  (ROCEPHIN )  IV 2 g (01/08/24 1053)   micafungin  (MYCAMINE ) 100 mg in sodium chloride  0.9 % 100 mL IVPB 100 mg (01/08/24 2144)    (feeding supplement) PROSource Plus  30 mL Oral BID BM   amLODipine   10 mg Oral Daily   aspirin  EC  81 mg Oral Daily   atorvastatin   80 mg Oral Daily   Chlorhexidine  Gluconate Cloth  6 each Topical Q0600   Chlorhexidine  Gluconate Cloth  6 each Topical Q0600   darbepoetin (ARANESP ) injection - DIALYSIS  60 mcg Subcutaneous Q Wed-1800   gabapentin   100 mg Oral BID   heparin   5,000 Units Subcutaneous Q8H   losartan  25 mg Oral Daily   metroNIDAZOLE   500 mg Oral Q12H   pantoprazole   40 mg Oral QHS   sodium chloride  flush  3-10 mL Intravenous Q12H    Dialysis Orders  [facility] M[facility]  4h   B500   70.5kg    TDC    Heparin  2000 Has  been coming off 2-4kg over last 2 wks Had 30 min Rx on 5/16    Assessment/Plan: Fever / leukopenia: blood cultures showing candidemia. ID on board. Needs line holiday. No fevers for past two days. WBC trending down on IV antibiotics.  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 On Flagyl  500 mg q 12 hours and Mycamine  100 mg q 24 hours.  ESRD: on HD MWF.  Last HD 01/05/24 with 3L UF. Tunneled cath placed on 01/08/24.  Next HD 01/10/24 HTN: BP still up, cont norvasc  and she has PRN hydralazine . UF will help with her BP. Added on ARB to start today. 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 ESA ordered.  Holding IV Fe with infection  Possible cholecystitis: surgery consulted 01/06/24. CT of the chest/abdomen/pelvis shows atypical inflammatory changes of her lungs  as well as gallstones with trace pericholecystic fluid. RUQ U/S shows stones, wall thickening, and pericholecystic fluid. Both of this studies also show a nodular liver contour concerning for possible cirrhosis  Secondary hyperparathyroidism: CCa 9.7, phos 5.5.  Nutrition: albumin  2.8 on renal diet. Continue protein supplements.   Carol Lorenzo, NP-C 01/09/2024, 8:25 AM  [facility]   Seen and examined independently.  Agree with note and exam as documented above by physician extender and as noted here.  General adult female in bed in no acute distress HEENT normocephalic atraumatic extraocular movements intact sclera anicteric Neck supple trachea midline Lungs clear to auscultation bilaterally normal work of breathing at rest on room air, lying flat Heart S1S2 no rub Abdomen soft nontender nondistended Extremities no pitting edema  Psych normal mood and affect Neuro - alert and oriented x 3 provides hx and follows commands  No access in at present (site bandaged); left AVF with bruit and thrill    Fungemia - s/p line holiday. IR placed a tunneled rather  than a nontunneled catheter.  Per ID continue with the tunneled catheter and follow cultures.  Antifungals per ID.  Cultures from 5/19 NGTD (positive from 5/17)   ESRD - HD per MWF schedule; I have requested daily weights as limited weights available but last weight quite elevated.  Increase UF gently with HD next tx.  I have ordered daily renal panel.  Labs from today are in process   HTN - missed amlodipine  yesterday and the patient also cites pain. I asked RN to go ahead and give her amlodipine  early and we will UF with HD  Anemia of CKD - on ESA. CBC is in process.  Increase aranesp   Carol Aver, MD 01/09/2024  8:43 AM

## 2024-01-09 NOTE — Anesthesia Preprocedure Evaluation (Addendum)
 Anesthesia Evaluation  Patient identified by MRN, date of birth, ID band Patient awake    Reviewed: Allergy & Precautions, NPO status , Patient's Chart, lab work & pertinent test results  Airway Mallampati: II  TM Distance: >3 FB Neck ROM: Full    Dental no notable dental hx. (+) Poor Dentition, Missing   Pulmonary COPD, Current Smoker and Patient abstained from smoking.   Pulmonary exam normal        Cardiovascular hypertension, Pt. on medications + CAD, + Past MI and +CHF   Rhythm:Regular Rate:Normal     Neuro/Psych   Anxiety Depression       GI/Hepatic ,GERD  Medicated,,(+) Hepatitis -  Endo/Other  diabetes    Renal/GU   negative genitourinary   Musculoskeletal   Abdominal Normal abdominal exam  (+)   Peds  Hematology  (+) Blood dyscrasia, anemia   Anesthesia Other Findings   Reproductive/Obstetrics                             Anesthesia Physical Anesthesia Plan  ASA: 3  Anesthesia Plan: MAC   Post-op Pain Management:    Induction: Intravenous  PONV Risk Score and Plan: 1 and Propofol  infusion and Treatment may vary due to age or medical condition  Airway Management Planned: Simple Face Mask and Nasal Cannula  Additional Equipment: None  Intra-op Plan:   Post-operative Plan:   Informed Consent: I have reviewed the patients History and Physical, chart, labs and discussed the procedure including the risks, benefits and alternatives for the proposed anesthesia with the patient or authorized representative who has indicated his/her understanding and acceptance.     Dental advisory given  Plan Discussed with: CRNA  Anesthesia Plan Comments:        Anesthesia Quick Evaluation

## 2024-01-09 NOTE — Progress Notes (Addendum)
 Date of Admission:  01/03/2024    ID: Carol [name] is a 63 y.o. 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, around the time tunneled cath removed 5/21 new tunnled cath placed on same side previous tunneled cath No complaint Afebrile; no leukocytosis  Medications:   (feeding supplement) PROSource Plus  30 mL Oral BID BM   amLODipine   10 mg Oral Daily   aspirin  EC  81 mg Oral Daily   atorvastatin   80 mg Oral Daily   Chlorhexidine  Gluconate Cloth  6 each Topical Q0600   Chlorhexidine  Gluconate Cloth  6 each Topical Q0600   Chlorhexidine  Gluconate Cloth  6 each Topical Q0600   [START ON 01/15/2024] darbepoetin (ARANESP ) injection - DIALYSIS  100 mcg Subcutaneous Q Wed-1800   gabapentin   100 mg Oral BID   heparin   5,000 Units Subcutaneous Q8H   losartan  25 mg Oral Daily   metroNIDAZOLE   500 mg Oral Q12H   pantoprazole   40 mg Oral QHS    Objective: Vital signs in last 24 hours: Patient Vitals for the past 24 hrs:  BP Temp Temp src Pulse Resp SpO2 Height Weight  01/10/24 1123 (!) 149/63 -- -- 78 -- 100 % -- --  01/10/24 1113 -- (!) 97.5 F (36.4 C) -- 76 19 95 % -- --  01/10/24 1100 (!) 149/59 -- -- 75 (!) 22 91 % -- --  01/10/24 1045 (!) 173/71 -- -- 80 18 98 % -- --  01/10/24 1032 (!) 158/67 -- -- 80 (!) 22 97 % -- --  01/10/24 1030 (!) 158/67 -- -- 79 (!) 24 96 % -- --  01/10/24 1025 (!) 167/69 97.6 F (36.4 C) -- 76 15 96 % -- --  01/10/24 0712 (!) 167/77 98.1 F (36.7 C) Oral 68 18 96 % 5\' 4"  (1.626 m) 67.1 kg  01/10/24 0600 (!) 165/78 97.7 F (36.5 C) Oral 69 -- 97 % -- --  01/10/24 0423 (!) 173/71 -- -- -- -- -- -- --  01/10/24 0352 (!) 173/71 97.7 F (36.5 C) -- 62 16 99 % -- --  01/10/24 0011 (!) 176/80 97.9 F (36.6 C) -- 63 16 100 % -- --  01/09/24 2042 (!) 154/83 98.1 F (36.7 C) -- 64 16 100 % -- --     LDA HD cath  PHYSICAL EXAM:   General: Alert, cooperative, no distress; drowsy; just got back from lap-chole Lungs: normal respiratory effort Heart: Regular rate and rhythm, no murmur, rub or gallop. Abdomen: Soft, non-tender,not distended. Bowel sounds normal. No masses Extremities: atraumatic, no cyanosis. No edema. No clubbing Skin: No rashes or lesions. Or bruising Neurologic: Grossly non-focal  Central line: slight bruising right chest; new tunneled catheter insertion site no purulence/cellulitic change  Lab Results    Latest Ref Rng & Units 01/10/2024    7:24 AM 01/09/2024    7:39 AM 01/08/2024    6:14 AM  CBC  WBC 4.0 - 10.5 K/uL  5.5  6.4   Hemoglobin 12.0 - 15.0 g/dL 9.9  9.1  8.9   Hematocrit 36.0 - 46.0 % 29.0  27.6  27.3   Platelets 150 - 400 K/uL  136  124        Latest Ref Rng & Units 01/10/2024    2:59 PM 01/10/2024    2:58 PM 01/10/2024    7:24 AM  CMP  Glucose 70 - 99 mg/dL 147  829  75   BUN 8 - 23 mg/dL 31  32  30   Creatinine 0.44 - 1.00 mg/dL 5.62  1.30  8.65   Sodium 135 - 145 mmol/L 135  133  137   Potassium 3.5 - 5.1 mmol/L 3.9  3.9  3.6   Chloride 98 - 111 mmol/L 97  97  104   CO2 22 - 32 mmol/L 20  20    Calcium  8.9 - 10.3 mg/dL 9.0  8.8    Total Protein 6.5 - 8.1 g/dL 7.1     Total Bilirubin 0.0 - 1.2 mg/dL 0.9     Alkaline Phos 38 - 126 U/L 119     AST 15 - 41 U/L 51     ALT 0 - 44 U/L 14         Microbiology: 5/16 BC- candida tropicalis Studies/Results: EP STUDY Result Date: 01/09/2024 See surgical note for result.  IR Fluoro Guide CV Line Right Result Date: 01/08/2024 INDICATION: 63 year old female with history of end-stage renal disease on hemodialysis presenting for replacement of tunneled hemodialysis catheter after line holiday. EXAM: TUNNELED CENTRAL VENOUS HEMODIALYSIS CATHETER PLACEMENT WITH ULTRASOUND AND FLUOROSCOPIC GUIDANCE MEDICATIONS: Vancomycin 1 gm IV . The antibiotic was given in an appropriate time interval prior to skin puncture. ANESTHESIA/SEDATION:  Moderate (conscious) sedation was employed during this procedure. A total of Versed  1.5 mg and Fentanyl  50 mcg was administered intravenously. Moderate Sedation Time: 11 minutes. The patient's level of consciousness and vital signs were monitored continuously by radiology nursing throughout the procedure under my direct supervision. FLUOROSCOPY TIME:  Nine mGy reference air kerma COMPLICATIONS: None immediate. PROCEDURE: Informed written consent was obtained from the patient after a discussion of the risks, benefits, and alternatives to treatment. Questions regarding the procedure were encouraged and answered. The right neck and chest were prepped with chlorhexidine  in a sterile fashion, and a sterile drape was applied covering the operative field. Maximum barrier sterile technique with sterile gowns and gloves were used for the procedure. A timeout was performed prior to the initiation of the procedure. After creating a small venotomy incision, a 21 gauge micropuncture kit was utilized to access the internal jugular vein. Real-time ultrasound guidance was utilized for vascular access including the acquisition of a permanent ultrasound image documenting patency of the accessed vessel. A Rosen wire was advanced to the level of the IVC and the micropuncture sheath was exchanged for an 8 Fr dilator. A 14.5 French tunneled hemodialysis catheter measuring 23 cm from tip to cuff was tunneled in a retrograde fashion from the anterior chest wall to the venotomy incision. Serial dilation was then performed an a peel-away sheath was placed. The catheter was then placed through the peel-away sheath with the catheter tip ultimately positioned within the right atrium. Final catheter positioning was confirmed and documented with a spot radiographic image. The catheter aspirates and flushes normally. The catheter was flushed with appropriate volume heparin  dwells. The catheter exit site was secured with a 0-Silk retention suture.  The venotomy incision was closed with Dermabond. Sterile dressings were applied. The patient tolerated the procedure well without immediate post procedural complication. IMPRESSION: Successful placement of 23 cm tip to cuff tunneled hemodialysis catheter via the right internal jugular vein with catheter tip terminating within the right atrium. The catheter is ready for immediate use. Creasie Doctor, MD [facility] Radiology Electronically Signed   By: Jolaine Nasuti.D.  On: 01/08/2024 12:15   IR US  Guide Vasc Access Right Result Date: 01/08/2024 INDICATION: 63 year old female with history of end-stage renal disease on hemodialysis presenting for replacement of tunneled hemodialysis catheter after line holiday. EXAM: TUNNELED CENTRAL VENOUS HEMODIALYSIS CATHETER PLACEMENT WITH ULTRASOUND AND FLUOROSCOPIC GUIDANCE MEDICATIONS: Vancomycin 1 gm IV . The antibiotic was given in an appropriate time interval prior to skin puncture. ANESTHESIA/SEDATION: Moderate (conscious) sedation was employed during this procedure. A total of Versed  1.5 mg and Fentanyl  50 mcg was administered intravenously. Moderate Sedation Time: 11 minutes. The patient's level of consciousness and vital signs were monitored continuously by radiology nursing throughout the procedure under my direct supervision. FLUOROSCOPY TIME:  Nine mGy reference air kerma COMPLICATIONS: None immediate. PROCEDURE: Informed written consent was obtained from the patient after a discussion of the risks, benefits, and alternatives to treatment. Questions regarding the procedure were encouraged and answered. The right neck and chest were prepped with chlorhexidine  in a sterile fashion, and a sterile drape was applied covering the operative field. Maximum barrier sterile technique with sterile gowns and gloves were used for the procedure. A timeout was performed prior to the initiation of the procedure. After creating a  small venotomy incision, a 21 gauge micropuncture kit was utilized to access the internal jugular vein. Real-time ultrasound guidance was utilized for vascular access including the acquisition of a permanent ultrasound image documenting patency of the accessed vessel. A Rosen wire was advanced to the level of the IVC and the micropuncture sheath was exchanged for an 8 Fr dilator. A 14.5 French tunneled hemodialysis catheter measuring 23 cm from tip to cuff was tunneled in a retrograde fashion from the anterior chest wall to the venotomy incision. Serial dilation was then performed an a peel-away sheath was placed. The catheter was then placed through the peel-away sheath with the catheter tip ultimately positioned within the right atrium. Final catheter positioning was confirmed and documented with a spot radiographic image. The catheter aspirates and flushes normally. The catheter was flushed with appropriate volume heparin  dwells. The catheter exit site was secured with a 0-Silk retention suture. The venotomy incision was closed with Dermabond. Sterile dressings were applied. The patient tolerated the procedure well without immediate post procedural complication. IMPRESSION: Successful placement of 23 cm tip to cuff tunneled hemodialysis catheter via the right internal jugular vein with catheter tip terminating within the right atrium. The catheter is ready for immediate use. Creasie Doctor, MD [facility] Radiology Electronically Signed   By: Creasie Doctor M.D.   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 Shouldn't really be a bug for her. Maybe it got lost and tumbles on her   -finish 7 days cholecystitis/burkholderia treatment on 5/29 with cipro /flagyl  -finish 2 weeks candida treatment with micafungin ; pending susceptibility and can transition to fluconazole when testing available and confirms susceptibility -maintain standard isolation precaution -will follow repeat bcx and decide if further line management needed -will f/u culture and change to fluconazole when information available -discussed with primary team          Jamesetta Mcbride, MD [facility]  [facility] [phone]  pager   [phone] cell 01/10/2024, 4:25 PM

## 2024-01-09 NOTE — Progress Notes (Addendum)
 PROGRESS NOTE    Carol [name]  ZOX:[id number] DOB: Dec 11, 1960 DOA: 01/03/2024 PCP: [facility]   Brief Narrative: Carol [name] is a 63 y.o. female with a history of ESRD on hemodialysis, hyperlipidemia, GERD, pulm hypertension.  Patient presented secondary to upper chest pain and shortness of breath in addition to fever and chills and found to have evidence of sepsis secondary to candidemia.  Infectious ease consulted.  Hospitalization also complicated by evidence of cholelithiasis with possible cholelithiasis with possible cholecystitis; general surgery consulted and are following.   Assessment and Plan:  Sepsis Present on admission secondary to candidia tropicalis candidemia.  Fungemia In setting of HD cath. Noted on blood cultures (5/16 and 5/17). Cultures positive for candida tropicalis. ID consulted during this admission for management. Patient managed on Micafungin . Prior to admission [facility] removed on 5/19 and new TDC placed on 5/21.  Transthoracic Echocardiogram performed on 5/22 significant for no vegetations. -ID recommendations: micafungin  with plan to transition to fluconazole, ophthalmology exam  Positive blood cultures Blood cultures (5/19) significant for GNRs. Patient has been on Ceftriaxone  prior to cultures. Presumed likely secondary to gallbladder and concern for cholecystitis. -Continue Ceftriaxone  -Follow-up culture sensitivities  Cholelithiasis Possible cholecystitis General surgery consulted. Awaiting management/workup of candidemia prior to consideration of surgical management. -Continue Ceftriaxone  and Flagyl  -Follow-up general surgery recommendations  ESRD on hemodialysis Nephrology consulted for HD during hospitalization. Right internal jugular tunneled HD catheter removed on 5/19 and new right internal jugular tunneled HD catheter placed on 5/21.  Severe asymptomatic hypertension Improved with antihypertensive therapy and  hemodialysis. -Continue amlodipine   Anemia of chronic disease Chronic and stable.  Thyroid  nodule Noted incidentally on CT scan imaging. Outpatient follow-up    DVT prophylaxis: Subcutaneous heparin  Code Status:   Code Status: Full Code Family Communication: None at bedside Disposition Plan: Discharge pending continued ID/general surgery recommendations/management   Consultants:  Nephrology Infectious disease General surgery Interventional radiology  Procedures:  TDC removal TDC placement Hemodialysis Transthoracic Echocardiogram Transesophageal Echocardiogram  Antimicrobials: Vancomycin Micafungin  Flagyl  Ceftriaxone  Azithromycin     Subjective: Some vaginal itching still. No other concerns.  Objective: BP (!) 166/77 (BP Location: Right Arm)   Pulse 73   Temp 98.2 F (36.8 C) (Oral)   Resp 17   Ht 5\' 4"  (1.626 m)   Wt 76.5 kg   SpO2 95%   BMI 28.95 kg/m   Examination:  General exam: Appears calm and comfortable Respiratory system: Respiratory effort normal. Central nervous system: Alert and oriented. Psychiatry: Judgement and insight appear normal. Mood & affect appropriate.    Data Reviewed: I have personally reviewed following labs and imaging studies  CBC Lab Results  Component Value Date   WBC 5.5 01/09/2024   RBC 3.11 (L) 01/09/2024   HGB 9.1 (L) 01/09/2024   HCT 27.6 (L) 01/09/2024   MCV 88.7 01/09/2024   MCH 29.3 01/09/2024   PLT 136 (L) 01/09/2024   MCHC 33.0 01/09/2024   RDW 16.4 (H) 01/09/2024   LYMPHSABS 0.2 (L) 01/03/2024   MONOABS 0.0 (L) 01/03/2024   EOSABS 0.1 01/03/2024   BASOSABS 0.0 01/03/2024     Last metabolic panel Lab Results  Component Value Date   NA 134 (L) 01/09/2024   K 4.0 01/09/2024   CL 100 01/09/2024   CO2 22 01/09/2024   BUN 22 01/09/2024   CREATININE 4.41 (H) 01/09/2024   GLUCOSE 71 01/09/2024   GFRNONAA 11 (L) 01/09/2024   GFRAA 50 (L) 11/21/2020   CALCIUM  8.7 (L) 01/09/2024  PHOS 3.6  01/09/2024   PROT 6.9 01/05/2024   ALBUMIN  2.5 (L) 01/09/2024   LABGLOB 3.0 03/24/2023   AGRATIO 1.0 03/24/2023   BILITOT 0.6 01/05/2024   ALKPHOS 128 (H) 01/05/2024   AST 38 01/05/2024   ALT 29 01/05/2024   ANIONGAP 12 01/09/2024    GFR: Estimated Creatinine Clearance: 13.4 mL/min (A) (by C-G formula based on SCr of 4.41 mg/dL (H)).  Recent Results (from the past 240 hours)  Resp panel by RT-PCR (RSV, Flu A&B, Covid) Anterior Nasal Swab     Status: None   Collection Time: 01/03/24  1:33 PM   Specimen: Anterior Nasal Swab  Result Value Ref Range Status   SARS Coronavirus 2 by RT PCR NEGATIVE NEGATIVE Final   Influenza A by PCR NEGATIVE NEGATIVE Final   Influenza B by PCR NEGATIVE NEGATIVE Final    Comment: (NOTE) The Xpert Xpress SARS-CoV-2/FLU/RSV plus assay is intended as an aid in the diagnosis of influenza from Nasopharyngeal swab specimens and should not be used as a sole basis for treatment. Nasal washings and aspirates are unacceptable for Xpert Xpress SARS-CoV-2/FLU/RSV testing.  Fact Sheet for Patients: BloggerCourse.com  Fact Sheet for Healthcare Providers: SeriousBroker.it  This test is not yet approved or cleared by the [place]  FDA and has been authorized for detection and/or diagnosis of SARS-CoV-2 by FDA under an Emergency Use Authorization (EUA). This EUA will remain in effect (meaning this test can be used) for the duration of the COVID-19 declaration under Section 564(b)(1) of the Act, 21 U.S.C. section 360bbb-3(b)(1), unless the authorization is terminated or revoked.     Resp Syncytial Virus by PCR NEGATIVE NEGATIVE Final    Comment: (NOTE) Fact Sheet for Patients: BloggerCourse.com  Fact Sheet for Healthcare Providers: SeriousBroker.it  This test is not yet approved or cleared by the [place]  FDA and has been authorized for detection  and/or diagnosis of SARS-CoV-2 by FDA under an Emergency Use Authorization (EUA). This EUA will remain in effect (meaning this test can be used) for the duration of the COVID-19 declaration under Section 564(b)(1) of the Act, 21 U.S.C. section 360bbb-3(b)(1), unless the authorization is terminated or revoked.  Performed at [facility], [address] [place], [place] [address]   Blood culture (routine x 2)     Status: Abnormal (Preliminary result)   Collection Time: 01/03/24  2:59 PM   Specimen: BLOOD RIGHT FOREARM  Result Value Ref Range Status   Specimen Description BLOOD RIGHT FOREARM  Final   Special Requests   Final    BOTTLES DRAWN AEROBIC AND ANAEROBIC Blood Culture adequate volume   Culture  Setup Time   Final    YEAST IN BOTH AEROBIC AND ANAEROBIC BOTTLES CRITICAL RESULT CALLED TO, READ BACK BY AND VERIFIED WITH: PHARMD AUSTIN PAYTES [id number] 1938 BY J RAZZAK, MT    Culture (A)  Final    CANDIDA TROPICALIS Sent to [facility] for further susceptibility testing. Performed at [facility], [address] [place], [place] [address]    Report Status PENDING  Incomplete  Blood Culture ID Panel (Reflexed)     Status: Abnormal   Collection Time: 01/03/24  2:59 PM  Result Value Ref Range Status   Enterococcus faecalis NOT DETECTED NOT DETECTED Final   Enterococcus Faecium NOT DETECTED NOT DETECTED Final   Listeria monocytogenes NOT DETECTED NOT DETECTED Final   Staphylococcus species NOT DETECTED NOT DETECTED Final   Staphylococcus aureus (BCID) NOT DETECTED NOT DETECTED Final  Staphylococcus epidermidis NOT DETECTED NOT DETECTED Final   Staphylococcus lugdunensis NOT DETECTED NOT DETECTED Final   Streptococcus species NOT DETECTED NOT DETECTED Final   Streptococcus agalactiae NOT DETECTED NOT DETECTED Final   Streptococcus pneumoniae NOT DETECTED NOT DETECTED Final   Streptococcus pyogenes NOT DETECTED NOT DETECTED Final   A.calcoaceticus-baumannii NOT DETECTED  NOT DETECTED Final   Bacteroides fragilis NOT DETECTED NOT DETECTED Final   Enterobacterales NOT DETECTED NOT DETECTED Final   Enterobacter cloacae complex NOT DETECTED NOT DETECTED Final   Escherichia coli NOT DETECTED NOT DETECTED Final   Klebsiella aerogenes NOT DETECTED NOT DETECTED Final   Klebsiella oxytoca NOT DETECTED NOT DETECTED Final   Klebsiella pneumoniae NOT DETECTED NOT DETECTED Final   Proteus species NOT DETECTED NOT DETECTED Final   Salmonella species NOT DETECTED NOT DETECTED Final   Serratia marcescens NOT DETECTED NOT DETECTED Final   Haemophilus influenzae NOT DETECTED NOT DETECTED Final   Neisseria meningitidis NOT DETECTED NOT DETECTED Final   Pseudomonas aeruginosa NOT DETECTED NOT DETECTED Final   Stenotrophomonas maltophilia NOT DETECTED NOT DETECTED Final   Candida albicans NOT DETECTED NOT DETECTED Final   Candida auris NOT DETECTED NOT DETECTED Final   Candida glabrata NOT DETECTED NOT DETECTED Final   Candida krusei NOT DETECTED NOT DETECTED Final   Candida parapsilosis NOT DETECTED NOT DETECTED Final   Candida tropicalis DETECTED (A) NOT DETECTED Final    Comment: CRITICAL RESULT CALLED TO, READ BACK BY AND VERIFIED WITH: PHARMD AUSTIN PAYTES [id number] 1938 BY J RAZZAK, MT    Cryptococcus neoformans/gattii NOT DETECTED NOT DETECTED Final    Comment: Performed at [facility], [address], [place], [place] [address]  Blood culture (routine x 2)     Status: Abnormal   Collection Time: 01/04/24  5:39 AM   Specimen: BLOOD RIGHT ARM  Result Value Ref Range Status   Specimen Description BLOOD RIGHT ARM  Final   Special Requests   Final    BOTTLES DRAWN AEROBIC AND ANAEROBIC Blood Culture adequate volume   Culture  Setup Time   Final    BUDDING YEAST SEEN AEROBIC BOTTLE ONLY Gram Stain Report Called to,Read Back By and Verified With: PHARMD J LEDFORD 01/06/2024 @ 0059 BY AB Performed at [facility], [address] [place], [place]  [address]    Culture CANDIDA TROPICALIS (A)  Final   Report Status 01/07/2024 FINAL  Final  Respiratory (~20 pathogens) panel by PCR     Status: None   Collection Time: 01/04/24 11:35 PM   Specimen: Nasopharyngeal Swab; Respiratory  Result Value Ref Range Status   Adenovirus NOT DETECTED NOT DETECTED Final   Coronavirus 229E NOT DETECTED NOT DETECTED Final    Comment: (NOTE) The Coronavirus on the Respiratory Panel, DOES NOT test for the novel  Coronavirus (2019 nCoV)    Coronavirus HKU1 NOT DETECTED NOT DETECTED Final   Coronavirus NL63 NOT DETECTED NOT DETECTED Final   Coronavirus OC43 NOT DETECTED NOT DETECTED Final   Metapneumovirus NOT DETECTED NOT DETECTED Final   Rhinovirus / Enterovirus NOT DETECTED NOT DETECTED Final   Influenza A NOT DETECTED NOT DETECTED Final   Influenza B NOT DETECTED NOT DETECTED Final   Parainfluenza Virus 1 NOT DETECTED NOT DETECTED Final   Parainfluenza Virus 2 NOT DETECTED NOT DETECTED Final   Parainfluenza Virus 3 NOT DETECTED NOT DETECTED Final   Parainfluenza Virus 4 NOT DETECTED NOT DETECTED Final   Respiratory Syncytial Virus NOT DETECTED NOT  DETECTED Final   Bordetella pertussis NOT DETECTED NOT DETECTED Final   Bordetella Parapertussis NOT DETECTED NOT DETECTED Final   Chlamydophila pneumoniae NOT DETECTED NOT DETECTED Final   Mycoplasma pneumoniae NOT DETECTED NOT DETECTED Final    Comment: Performed at [facility], [address] [place], [place] [address]  Culture, blood (Routine X 2) w Reflex to ID Panel     Status: None (Preliminary result)   Collection Time: 01/06/24  8:13 AM   Specimen: BLOOD  Result Value Ref Range Status   Specimen Description BLOOD SITE NOT SPECIFIED  Final   Special Requests   Final    BOTTLES DRAWN AEROBIC AND ANAEROBIC Blood Culture results may not be optimal due to an inadequate volume of blood received in culture bottles   Culture  Setup Time   Final    GRAM NEGATIVE RODS BOTTLES DRAWN AEROBIC  ONLY CRITICAL RESULT CALLED TO, READ BACK BY AND VERIFIED WITH: V BRYK,PHARMD@[id number]  01/09/24 MK    Culture   Final    GRAM NEGATIVE RODS CULTURE REINCUBATED FOR BETTER GROWTH Performed at [facility], [address] [place], [place] [address]    Report Status PENDING  Incomplete  Culture, blood (Routine X 2) w Reflex to ID Panel     Status: None (Preliminary result)   Collection Time: 01/06/24  8:13 AM   Specimen: BLOOD  Result Value Ref Range Status   Specimen Description BLOOD SITE NOT SPECIFIED  Final   Special Requests   Final    BOTTLES DRAWN AEROBIC AND ANAEROBIC Blood Culture results may not be optimal due to an inadequate volume of blood received in culture bottles   Culture  Setup Time   Final    GRAM NEGATIVE RODS ANAEROBIC BOTTLE ONLY CRITICAL VALUE NOTED.  VALUE IS CONSISTENT WITH PREVIOUSLY REPORTED AND CALLED VALUE. Performed at [facility], [address] [place], [place] [address]    Culture GRAM NEGATIVE RODS  Final   Report Status PENDING  Incomplete  Blood Culture ID Panel (Reflexed)     Status: None   Collection Time: 01/06/24  8:13 AM  Result Value Ref Range Status   Enterococcus faecalis NOT DETECTED NOT DETECTED Final   Enterococcus Faecium NOT DETECTED NOT DETECTED Final   Listeria monocytogenes NOT DETECTED NOT DETECTED Final   Staphylococcus species NOT DETECTED NOT DETECTED Final   Staphylococcus aureus (BCID) NOT DETECTED NOT DETECTED Final   Staphylococcus epidermidis NOT DETECTED NOT DETECTED Final   Staphylococcus lugdunensis NOT DETECTED NOT DETECTED Final   Streptococcus species NOT DETECTED NOT DETECTED Final   Streptococcus agalactiae NOT DETECTED NOT DETECTED Final   Streptococcus pneumoniae NOT DETECTED NOT DETECTED Final   Streptococcus pyogenes NOT DETECTED NOT DETECTED Final   A.calcoaceticus-baumannii NOT DETECTED NOT DETECTED Final   Bacteroides fragilis NOT DETECTED NOT DETECTED Final   Enterobacterales NOT DETECTED NOT  DETECTED Final   Enterobacter cloacae complex NOT DETECTED NOT DETECTED Final   Escherichia coli NOT DETECTED NOT DETECTED Final   Klebsiella aerogenes NOT DETECTED NOT DETECTED Final   Klebsiella oxytoca NOT DETECTED NOT DETECTED Final   Klebsiella pneumoniae NOT DETECTED NOT DETECTED Final   Proteus species NOT DETECTED NOT DETECTED Final   Salmonella species NOT DETECTED NOT DETECTED Final   Serratia marcescens NOT DETECTED NOT DETECTED Final   Haemophilus influenzae NOT DETECTED NOT DETECTED Final   Neisseria meningitidis NOT DETECTED NOT DETECTED Final   Pseudomonas aeruginosa NOT DETECTED NOT DETECTED Final  Stenotrophomonas maltophilia NOT DETECTED NOT DETECTED Final   Candida albicans NOT DETECTED NOT DETECTED Final   Candida auris NOT DETECTED NOT DETECTED Final   Candida glabrata NOT DETECTED NOT DETECTED Final   Candida krusei NOT DETECTED NOT DETECTED Final   Candida parapsilosis NOT DETECTED NOT DETECTED Final   Candida tropicalis NOT DETECTED NOT DETECTED Final   Cryptococcus neoformans/gattii NOT DETECTED NOT DETECTED Final    Comment: Performed at [facility], [address] [place], [place] [address]  Culture, blood (Routine X 2) w Reflex to ID Panel     Status: None (Preliminary result)   Collection Time: 01/06/24  8:22 PM   Specimen: BLOOD  Result Value Ref Range Status   Specimen Description BLOOD SITE NOT SPECIFIED  Final   Special Requests   Final    BOTTLES DRAWN AEROBIC AND ANAEROBIC Blood Culture adequate volume   Culture   Final    NO GROWTH 3 DAYS Performed at [facility], [address] [place], [place] [address]    Report Status PENDING  Incomplete  Culture, blood (Routine X 2) w Reflex to ID Panel     Status: None (Preliminary result)   Collection Time: 01/06/24  8:22 PM   Specimen: BLOOD  Result Value Ref Range Status   Specimen Description BLOOD SITE NOT SPECIFIED  Final   Special Requests   Final    BOTTLES DRAWN AEROBIC AND  ANAEROBIC Blood Culture adequate volume   Culture   Final    NO GROWTH 3 DAYS Performed at [facility], [address], [place], [place] [address]    Report Status PENDING  Incomplete      Radiology Studies: ECHO TEE Result Date: 01/09/2024    TRANSESOPHOGEAL ECHO REPORT   Patient Name:   ELONA YINGER Date of Exam: 01/09/2024 Medical Rec #:  [id number]               Height:       64.0 in Accession #:    [id number]              Weight:       168.7 lb Date of Birth:  08/05/1961               BSA:          1.820 m Patient Age:    63 years                BP:           170/81 mmHg Patient Gender: F                       HR:           75 bpm. Exam Location:  Inpatient Procedure: Transesophageal Echo, Cardiac Doppler and Color Doppler (Both            Spectral and Color Flow Doppler were utilized during procedure). Indications:     endocarditis  History:         Patient has prior history of Echocardiogram examinations, most                  recent 01/05/2024. CHF, CAD, COPD and End stage renal disease;                  Risk Factors:Hypertension and Diabetes.  Sonographer:     Kip Peon RDCS Referring Phys:  [id number] XIKA ZHAO Diagnosing  Phys: Dinah Franco MD PROCEDURE: After discussion of the risks and benefits of a TEE, an informed consent was obtained from the patient. The transesophogeal probe was passed without difficulty through the esophogus of the patient. Imaged were obtained with the patient in a left lateral decubitus position. Sedation performed by different physician. The patient was monitored while under deep sedation. Anesthestetic sedation was provided intravenously by Anesthesiology: 170mg  of Propofol . The patient developed no complications during the procedure.  IMPRESSIONS  1. Left ventricular ejection fraction, by estimation, is 70 to 75%. The left ventricle has hyperdynamic function. There is mild left ventricular hypertrophy.  2. Right ventricular systolic function is  normal. The right ventricular size is normal.  3. Left atrial size was mildly dilated. No left atrial/left atrial appendage thrombus was detected.  4. Right atrial size was mildly dilated.  5. The mitral valve is grossly normal. Mild mitral valve regurgitation.  6. The aortic valve is tricuspid. Aortic valve regurgitation is not visualized. Conclusion(s)/Recommendation(s): No evidence of vegetation/infective endocarditis on this transesophageael echocardiogram. FINDINGS  Left Ventricle: Left ventricular ejection fraction, by estimation, is 70 to 75%. The left ventricle has hyperdynamic function. The left ventricular internal cavity size was normal in size. There is mild left ventricular hypertrophy. Right Ventricle: The right ventricular size is normal. No increase in right ventricular wall thickness. Right ventricular systolic function is normal. Left Atrium: Left atrial size was mildly dilated. No left atrial/left atrial appendage thrombus was detected. Right Atrium: Right atrial size was mildly dilated. Pericardium: Trivial pericardial effusion is present. The pericardial effusion is circumferential. Mitral Valve: The mitral valve is grossly normal. Mild mitral valve regurgitation. Tricuspid Valve: The tricuspid valve is grossly normal. Tricuspid valve regurgitation is mild. Aortic Valve: The aortic valve is tricuspid. Aortic valve regurgitation is not visualized. Pulmonic Valve: The pulmonic valve was grossly normal. Pulmonic valve regurgitation is trivial. Aorta: The aortic root and ascending aorta are structurally normal, with no evidence of dilitation. IAS/Shunts: No atrial level shunt detected by color flow Doppler. Additional Comments: Spectral Doppler performed. LEFT VENTRICLE PLAX 2D LVOT diam:     2.10 cm LVOT Area:     3.46 cm   AORTA Ao Asc diam: 3.60 cm  SHUNTS Systemic Diam: 2.10 cm Dinah Franco MD Electronically signed by Dinah Franco MD Signature Date/Time: 01/09/2024/2:15:29 PM    Final    EP  STUDY Result Date: 01/09/2024 See surgical note for result.  IR Fluoro Guide CV Line Right Result Date: 01/08/2024 INDICATION: 63 year old female with history of end-stage renal disease on hemodialysis presenting for replacement of tunneled hemodialysis catheter after line holiday. EXAM: TUNNELED CENTRAL VENOUS HEMODIALYSIS CATHETER PLACEMENT WITH ULTRASOUND AND FLUOROSCOPIC GUIDANCE MEDICATIONS: Vancomycin 1 gm IV . The antibiotic was given in an appropriate time interval prior to skin puncture. ANESTHESIA/SEDATION: Moderate (conscious) sedation was employed during this procedure. A total of Versed  1.5 mg and Fentanyl  50 mcg was administered intravenously. Moderate Sedation Time: 11 minutes. The patient's level of consciousness and vital signs were monitored continuously by radiology nursing throughout the procedure under my direct supervision. FLUOROSCOPY TIME:  Nine mGy reference air kerma COMPLICATIONS: None immediate. PROCEDURE: Informed written consent was obtained from the patient after a discussion of the risks, benefits, and alternatives to treatment. Questions regarding the procedure were encouraged and answered. The right neck and chest were prepped with chlorhexidine  in a sterile fashion, and a sterile drape was applied covering the operative field. Maximum barrier sterile technique with sterile gowns and gloves were used  for the procedure. A timeout was performed prior to the initiation of the procedure. After creating a small venotomy incision, a 21 gauge micropuncture kit was utilized to access the internal jugular vein. Real-time ultrasound guidance was utilized for vascular access including the acquisition of a permanent ultrasound image documenting patency of the accessed vessel. A Rosen wire was advanced to the level of the IVC and the micropuncture sheath was exchanged for an 8 Fr dilator. A 14.5 French tunneled hemodialysis catheter measuring 23 cm from tip to cuff was tunneled in a  retrograde fashion from the anterior chest wall to the venotomy incision. Serial dilation was then performed an a peel-away sheath was placed. The catheter was then placed through the peel-away sheath with the catheter tip ultimately positioned within the right atrium. Final catheter positioning was confirmed and documented with a spot radiographic image. The catheter aspirates and flushes normally. The catheter was flushed with appropriate volume heparin  dwells. The catheter exit site was secured with a 0-Silk retention suture. The venotomy incision was closed with Dermabond. Sterile dressings were applied. The patient tolerated the procedure well without immediate post procedural complication. IMPRESSION: Successful placement of 23 cm tip to cuff tunneled hemodialysis catheter via the right internal jugular vein with catheter tip terminating within the right atrium. The catheter is ready for immediate use. Creasie Doctor, MD [facility] Radiology Electronically Signed   By: Creasie Doctor M.D.   On: 01/08/2024 12:15   IR US  Guide Vasc Access Right Result Date: 01/08/2024 INDICATION: 63 year old female with history of end-stage renal disease on hemodialysis presenting for replacement of tunneled hemodialysis catheter after line holiday. EXAM: TUNNELED CENTRAL VENOUS HEMODIALYSIS CATHETER PLACEMENT WITH ULTRASOUND AND FLUOROSCOPIC GUIDANCE MEDICATIONS: Vancomycin 1 gm IV . The antibiotic was given in an appropriate time interval prior to skin puncture. ANESTHESIA/SEDATION: Moderate (conscious) sedation was employed during this procedure. A total of Versed  1.5 mg and Fentanyl  50 mcg was administered intravenously. Moderate Sedation Time: 11 minutes. The patient's level of consciousness and vital signs were monitored continuously by radiology nursing throughout the procedure under my direct supervision. FLUOROSCOPY TIME:  Nine mGy reference air kerma COMPLICATIONS: None  immediate. PROCEDURE: Informed written consent was obtained from the patient after a discussion of the risks, benefits, and alternatives to treatment. Questions regarding the procedure were encouraged and answered. The right neck and chest were prepped with chlorhexidine  in a sterile fashion, and a sterile drape was applied covering the operative field. Maximum barrier sterile technique with sterile gowns and gloves were used for the procedure. A timeout was performed prior to the initiation of the procedure. After creating a small venotomy incision, a 21 gauge micropuncture kit was utilized to access the internal jugular vein. Real-time ultrasound guidance was utilized for vascular access including the acquisition of a permanent ultrasound image documenting patency of the accessed vessel. A Rosen wire was advanced to the level of the IVC and the micropuncture sheath was exchanged for an 8 Fr dilator. A 14.5 French tunneled hemodialysis catheter measuring 23 cm from tip to cuff was tunneled in a retrograde fashion from the anterior chest wall to the venotomy incision. Serial dilation was then performed an a peel-away sheath was placed. The catheter was then placed through the peel-away sheath with the catheter tip ultimately positioned within the right atrium. Final catheter positioning was confirmed and documented with a spot radiographic image. The catheter aspirates and flushes normally. The catheter was flushed with appropriate volume heparin  dwells.  The catheter exit site was secured with a 0-Silk retention suture. The venotomy incision was closed with Dermabond. Sterile dressings were applied. The patient tolerated the procedure well without immediate post procedural complication. IMPRESSION: Successful placement of 23 cm tip to cuff tunneled hemodialysis catheter via the right internal jugular vein with catheter tip terminating within the right atrium. The catheter is ready for immediate use. Creasie Doctor, MD  [facility] Radiology Electronically Signed   By: Creasie Doctor M.D.   On: 01/08/2024 12:15      LOS: 6 days    Aneita Keens, MD [facility] 01/09/2024, 2:18 PM   If 7PM-7AM, please contact night-coverage [email or web address]

## 2024-01-09 NOTE — Anesthesia Preprocedure Evaluation (Addendum)
 Anesthesia Evaluation  Patient identified by MRN, date of birth, ID band Patient awake    Reviewed: Allergy & Precautions, H&P , NPO status , Patient's Chart, lab work & pertinent test results  Airway Mallampati: II  TM Distance: >3 FB Neck ROM: Full    Dental no notable dental hx. (+) Poor Dentition, Missing, Dental Advisory Given,    Pulmonary COPD, Current Smoker and Patient abstained from smoking.   Pulmonary exam normal        Cardiovascular Exercise Tolerance: Good hypertension, Pt. on medications + CAD, + Past MI and +CHF   Rhythm:Regular Rate:Normal  ECHO 5/25  1. Left ventricular ejection fraction, by estimation, is 70 to 75%. The  left ventricle has hyperdynamic function. The left ventricle has no  regional wall motion abnormalities. There is mild concentric left  ventricular hypertrophy. Left ventricular  diastolic parameters are indeterminate.   2. Right ventricular systolic function is normal. The right ventricular  size is normal.   3. Left atrial size was mildly dilated.   4. Right atrial size was mildly dilated.   5. The mitral valve is abnormal. Mild mitral valve regurgitation. No  evidence of mitral stenosis.   6. The aortic valve is normal in structure. Aortic valve regurgitation is  not visualized. No aortic stenosis is present.   7. The inferior vena cava is dilated in size with >50% respiratory  variability, suggesting right atrial pressure of 8 mmHg.      Neuro/Psych  PSYCHIATRIC DISORDERS Anxiety Depression    negative neurological ROS     GI/Hepatic negative GI ROS,GERD  Medicated,,(+)     substance abuse  cocaine  use, Hepatitis -  Endo/Other  diabetes    Renal/GU CRFRenal disease  negative genitourinary   Musculoskeletal   Abdominal Normal abdominal exam  (+)   Peds  Hematology negative hematology ROS (+) Blood dyscrasia, anemia   Anesthesia Other Findings    Reproductive/Obstetrics negative OB ROS                             Anesthesia Physical Anesthesia Plan  ASA: 3  Anesthesia Plan: General   Post-op Pain Management: Toradol  IV (intra-op)* and Ofirmev  IV (intra-op)*   Induction: Intravenous  PONV Risk Score and Plan: 3 and Ondansetron  and Dexamethasone  Airway Management Planned: Oral ETT  Additional Equipment: None  Intra-op Plan:   Post-operative Plan: Extubation in OR  Informed Consent: I have reviewed the patients History and Physical, chart, labs and discussed the procedure including the risks, benefits and alternatives for the proposed anesthesia with the patient or authorized representative who has indicated his/her understanding and acceptance.     Dental advisory given  Plan Discussed with: CRNA and Anesthesiologist  Anesthesia Plan Comments: (  )       Anesthesia Quick Evaluation

## 2024-01-09 NOTE — Plan of Care (Signed)

## 2024-01-09 NOTE — Progress Notes (Signed)
 Subjective/Chief Complaint: Still having some pain in the RUQ   Objective: Vital signs in last 24 hours: Temp:  [98.2 F (36.8 C)-99.4 F (37.4 C)] 98.3 F (36.8 C) (05/22 1628) Pulse Rate:  [65-74] 65 (05/22 1628) Resp:  [11-23] 16 (05/22 1628) BP: (144-193)/(69-99) 148/70 (05/22 1628) SpO2:  [93 %-100 %] 96 % (05/22 1628) Weight:  [76.3 kg-76.5 kg] 76.5 kg (05/22 0500) Last BM Date : 01/05/24  Intake/Output from previous day: 05/21 0701 - 05/22 0700 In: -  Out: 2000  Intake/Output this shift: No intake/output data recorded.  Exam: Awake and alert Comfortable Abdomen soft, tenderness with guarding in the RUQ  Lab Results:  Recent Labs    01/08/24 0614 01/09/24 0739  WBC 6.4 5.5  HGB 8.9* 9.1*  HCT 27.3* 27.6*  PLT 124* 136*   BMET Recent Labs    01/08/24 0614 01/09/24 0739  NA 131* 134*  K 3.9 4.0  CL 98 100  CO2 21* 22  GLUCOSE 72 71  BUN 41* 22  CREATININE 6.70* 4.41*  CALCIUM  8.3* 8.7*   PT/INR Recent Labs    01/09/24 0648  LABPROT 14.4  INR 1.1   ABG No results for input(s): "PHART", "HCO3" in the last 72 hours.  Invalid input(s): "PCO2", "PO2"  Studies/Results: ECHO TEE Result Date: 01/09/2024    TRANSESOPHOGEAL ECHO REPORT   Patient Name:   Carol [name] Date of Exam: 01/09/2024 Medical Rec #:  [id number]               Height:       64.0 in Accession #:    [id number]              Weight:       168.7 lb Date of Birth:  [birth year]               BSA:          1.820 m Patient Age:    63 years                BP:           170/81 mmHg Patient Gender: F                       HR:           75 bpm. Exam Location:  Inpatient Procedure: Transesophageal Echo, Cardiac Doppler and Color Doppler (Both            Spectral and Color Flow Doppler were utilized during procedure). Indications:     endocarditis  History:         Patient has prior history of Echocardiogram examinations, most                  recent 01/05/2024. CHF, CAD, COPD and End stage  renal disease;                  Risk Factors:Hypertension and Diabetes.  Sonographer:     Kip Peon RDCS Referring Phys:  [id number] Dellis Fermo Diagnosing Phys: Dinah Franco MD PROCEDURE: After discussion of the risks and benefits of a TEE, an informed consent was obtained from the patient. The transesophogeal probe was passed without difficulty through the esophogus of the patient. Imaged were obtained with the patient in a left lateral decubitus position. Sedation performed by different physician. The patient was monitored while under deep sedation. Anesthestetic sedation was provided intravenously by Anesthesiology: 170mg  of Propofol . The patient  developed no complications during the procedure.  IMPRESSIONS  1. Left ventricular ejection fraction, by estimation, is 70 to 75%. The left ventricle has hyperdynamic function. There is mild left ventricular hypertrophy.  2. Right ventricular systolic function is normal. The right ventricular size is normal.  3. Left atrial size was mildly dilated. No left atrial/left atrial appendage thrombus was detected.  4. Right atrial size was mildly dilated.  5. The mitral valve is grossly normal. Mild mitral valve regurgitation.  6. The aortic valve is tricuspid. Aortic valve regurgitation is not visualized. Conclusion(s)/Recommendation(s): No evidence of vegetation/infective endocarditis on this transesophageael echocardiogram. FINDINGS  Left Ventricle: Left ventricular ejection fraction, by estimation, is 70 to 75%. The left ventricle has hyperdynamic function. The left ventricular internal cavity size was normal in size. There is mild left ventricular hypertrophy. Right Ventricle: The right ventricular size is normal. No increase in right ventricular wall thickness. Right ventricular systolic function is normal. Left Atrium: Left atrial size was mildly dilated. No left atrial/left atrial appendage thrombus was detected. Right Atrium: Right atrial size was mildly dilated.  Pericardium: Trivial pericardial effusion is present. The pericardial effusion is circumferential. Mitral Valve: The mitral valve is grossly normal. Mild mitral valve regurgitation. Tricuspid Valve: The tricuspid valve is grossly normal. Tricuspid valve regurgitation is mild. Aortic Valve: The aortic valve is tricuspid. Aortic valve regurgitation is not visualized. Pulmonic Valve: The pulmonic valve was grossly normal. Pulmonic valve regurgitation is trivial. Aorta: The aortic root and ascending aorta are structurally normal, with no evidence of dilitation. IAS/Shunts: No atrial level shunt detected by color flow Doppler. Additional Comments: Spectral Doppler performed. LEFT VENTRICLE PLAX 2D LVOT diam:     2.10 cm LVOT Area:     3.46 cm   AORTA Ao Asc diam: 3.60 cm  SHUNTS Systemic Diam: 2.10 cm Dinah Franco MD Electronically signed by Dinah Franco MD Signature Date/Time: 01/09/2024/2:15:29 PM    Final    EP STUDY Result Date: 01/09/2024 See surgical note for result.  IR Fluoro Guide CV Line Right Result Date: 01/08/2024 INDICATION: 63 year old female with history of end-stage renal disease on hemodialysis presenting for replacement of tunneled hemodialysis catheter after line holiday. EXAM: TUNNELED CENTRAL VENOUS HEMODIALYSIS CATHETER PLACEMENT WITH ULTRASOUND AND FLUOROSCOPIC GUIDANCE MEDICATIONS: Vancomycin 1 gm IV . The antibiotic was given in an appropriate time interval prior to skin puncture. ANESTHESIA/SEDATION: Moderate (conscious) sedation was employed during this procedure. A total of Versed  1.5 mg and Fentanyl  50 mcg was administered intravenously. Moderate Sedation Time: 11 minutes. The patient's level of consciousness and vital signs were monitored continuously by radiology nursing throughout the procedure under my direct supervision. FLUOROSCOPY TIME:  Nine mGy reference air kerma COMPLICATIONS: None immediate. PROCEDURE: Informed written consent was obtained from the patient after a  discussion of the risks, benefits, and alternatives to treatment. Questions regarding the procedure were encouraged and answered. The right neck and chest were prepped with chlorhexidine  in a sterile fashion, and a sterile drape was applied covering the operative field. Maximum barrier sterile technique with sterile gowns and gloves were used for the procedure. A timeout was performed prior to the initiation of the procedure. After creating a small venotomy incision, a 21 gauge micropuncture kit was utilized to access the internal jugular vein. Real-time ultrasound guidance was utilized for vascular access including the acquisition of a permanent ultrasound image documenting patency of the accessed vessel. A Rosen wire was advanced to the level of the IVC and the micropuncture sheath was exchanged for  an 8 Fr dilator. A 14.5 French tunneled hemodialysis catheter measuring 23 cm from tip to cuff was tunneled in a retrograde fashion from the anterior chest wall to the venotomy incision. Serial dilation was then performed an a peel-away sheath was placed. The catheter was then placed through the peel-away sheath with the catheter tip ultimately positioned within the right atrium. Final catheter positioning was confirmed and documented with a spot radiographic image. The catheter aspirates and flushes normally. The catheter was flushed with appropriate volume heparin  dwells. The catheter exit site was secured with a 0-Silk retention suture. The venotomy incision was closed with Dermabond. Sterile dressings were applied. The patient tolerated the procedure well without immediate post procedural complication. IMPRESSION: Successful placement of 23 cm tip to cuff tunneled hemodialysis catheter via the right internal jugular vein with catheter tip terminating within the right atrium. The catheter is ready for immediate use. Creasie Doctor, MD [facility] Radiology  Electronically Signed   By: Creasie Doctor M.D.   On: 01/08/2024 12:15   IR US  Guide Vasc Access Right Result Date: 01/08/2024 INDICATION: 63 year old female with history of end-stage renal disease on hemodialysis presenting for replacement of tunneled hemodialysis catheter after line holiday. EXAM: TUNNELED CENTRAL VENOUS HEMODIALYSIS CATHETER PLACEMENT WITH ULTRASOUND AND FLUOROSCOPIC GUIDANCE MEDICATIONS: Vancomycin 1 gm IV . The antibiotic was given in an appropriate time interval prior to skin puncture. ANESTHESIA/SEDATION: Moderate (conscious) sedation was employed during this procedure. A total of Versed  1.5 mg and Fentanyl  50 mcg was administered intravenously. Moderate Sedation Time: 11 minutes. The patient's level of consciousness and vital signs were monitored continuously by radiology nursing throughout the procedure under my direct supervision. FLUOROSCOPY TIME:  Nine mGy reference air kerma COMPLICATIONS: None immediate. PROCEDURE: Informed written consent was obtained from the patient after a discussion of the risks, benefits, and alternatives to treatment. Questions regarding the procedure were encouraged and answered. The right neck and chest were prepped with chlorhexidine  in a sterile fashion, and a sterile drape was applied covering the operative field. Maximum barrier sterile technique with sterile gowns and gloves were used for the procedure. A timeout was performed prior to the initiation of the procedure. After creating a small venotomy incision, a 21 gauge micropuncture kit was utilized to access the internal jugular vein. Real-time ultrasound guidance was utilized for vascular access including the acquisition of a permanent ultrasound image documenting patency of the accessed vessel. A Rosen wire was advanced to the level of the IVC and the micropuncture sheath was exchanged for an 8 Fr dilator. A 14.5 French tunneled hemodialysis catheter measuring 23 cm from tip to cuff was tunneled in  a retrograde fashion from the anterior chest wall to the venotomy incision. Serial dilation was then performed an a peel-away sheath was placed. The catheter was then placed through the peel-away sheath with the catheter tip ultimately positioned within the right atrium. Final catheter positioning was confirmed and documented with a spot radiographic image. The catheter aspirates and flushes normally. The catheter was flushed with appropriate volume heparin  dwells. The catheter exit site was secured with a 0-Silk retention suture. The venotomy incision was closed with Dermabond. Sterile dressings were applied. The patient tolerated the procedure well without immediate post procedural complication. IMPRESSION: Successful placement of 23 cm tip to cuff tunneled hemodialysis catheter via the right internal jugular vein with catheter tip terminating within the right atrium. The catheter is ready for immediate use. Creasie Doctor, MD [facility]  Special[facility] Radiology Electronically Signed   By: Creasie Doctor M.D.   On: 01/08/2024 12:15    Anti-infectives: Anti-infectives (From admission, onward)    Start     Dose/Rate Route Frequency Ordered Stop   01/08/24 0810  vancomycin (VANCOCIN) IVPB 1000 mg/200 mL premix        over 60 Minutes Intravenous Continuous PRN 01/08/24 0810 01/08/24 0843   01/08/24 0000  vancomycin (VANCOCIN) IVPB 1000 mg/200 mL premix        1,000 mg 200 mL/hr over 60 Minutes Intravenous  Once 01/07/24 0922 01/08/24 0024   01/07/24 1015  metroNIDAZOLE  (FLAGYL ) tablet 500 mg        500 mg Oral Every 12 hours 01/07/24 0922     01/05/24 1430  metroNIDAZOLE  (FLAGYL ) IVPB 500 mg  Status:  Discontinued        500 mg 100 mL/hr over 60 Minutes Intravenous Every 12 hours 01/05/24 1330 01/07/24 0922   01/04/24 2030  micafungin  (MYCAMINE ) 100 mg in sodium chloride  0.9 % 100 mL IVPB        100 mg 105 mL/hr over 1 Hours Intravenous Every 24 hours 01/04/24 1940      01/04/24 1000  cefTRIAXone  (ROCEPHIN ) 2 g in sodium chloride  0.9 % 100 mL IVPB        2 g 200 mL/hr over 30 Minutes Intravenous Every 24 hours 01/03/24 2055     01/03/24 1615  cefTRIAXone  (ROCEPHIN ) 1 g in sodium chloride  0.9 % 100 mL IVPB        1 g 200 mL/hr over 30 Minutes Intravenous  Once 01/03/24 1601 01/03/24 1759   01/03/24 1615  azithromycin  (ZITHROMAX ) 500 mg in sodium chloride  0.9 % 250 mL IVPB        500 mg 250 mL/hr over 60 Minutes Intravenous  Once 01/03/24 1601 01/03/24 1915       Assessment/Plan: Cholelithiasis 63 y/o F admitted with fever, candida bacteremia with imaging studies concerning for inflammatory changes around the gallbladder.  -Will plan for cholecystectomy tomorrow (5/23) pending OR availability - NPO at [facility] - CMP in the morning - Continue antibiotics - I have messaged nephrology regarding possible dialysis tonight.   Cannon Champion 01/09/2024

## 2024-01-09 NOTE — Transfer of Care (Signed)
 Immediate Anesthesia Transfer of Care Note  Patient: Carol [name]  Procedure(s) Performed: TRANSESOPHAGEAL ECHOCARDIOGRAM  Patient Location: PACU  Anesthesia Type:MAC  Level of Consciousness: drowsy  Airway & Oxygen Therapy: Patient Spontanous Breathing and Patient connected to face mask oxygen  Post-op Assessment: Report given to RN and Post -op Vital signs reviewed and stable  Post vital signs: Reviewed and stable  Last Vitals:  Vitals Value Taken Time  BP 150/77 01/09/24 1038  Temp    Pulse 73 01/09/24 1040  Resp 16 01/09/24 1040  SpO2 99 % 01/09/24 1040  Vitals shown include unfiled device data.  Last Pain:  Vitals:   01/09/24 1038  TempSrc:   PainSc: Asleep      Patients Stated Pain Goal: 0 (01/04/24 1646)  Complications: No notable events documented.

## 2024-01-09 NOTE — Progress Notes (Signed)
  Echocardiogram Echocardiogram Transesophageal has been performed.  Farley Honer, RDCS 01/09/2024, 10:58 AM

## 2024-01-09 NOTE — CV Procedure (Signed)
 TRANSESOPHAGEAL ECHOCARDIOGRAM (TEE) NOTE  INDICATIONS: infective endocarditis  PROCEDURE:   Informed consent was obtained prior to the procedure. The risks, benefits and alternatives for the procedure were discussed and the patient comprehended these risks.  Risks include, but are not limited to, cough, sore throat, vomiting, nausea, somnolence, esophageal and stomach trauma or perforation, bleeding, low blood pressure, aspiration, pneumonia, infection, trauma to the teeth and death.    After a procedural time-out, the patient was given propofol  for sedation by anesthesia. See their separate report.  The patient's heart rate, blood pressure, and oxygen saturation are monitored continuously during the procedure.The oropharynx was anesthetized with topical cetacaine.  The transesophageal probe was inserted in the esophagus and stomach without difficulty and multiple views were obtained.  The patient was kept under observation until the patient left the procedure room.  I was present face-to-face 100% of this time. The patient left the procedure room in stable condition.   Agitated microbubble saline contrast was not administered.  COMPLICATIONS:    There were no immediate complications.  Findings:  LEFT VENTRICLE: The left ventricular wall thickness is mildly increased.  The left ventricular cavity is normal in size. Wall motion is normal.  LVEF is 70-75%.  RIGHT VENTRICLE:  The right ventricle is normal in structure and function without any thrombus or masses.    LEFT ATRIUM:  The left atrium is mildly dilated in size without any thrombus or masses.  There is not spontaneous echo contrast ("smoke") in the left atrium consistent with a low flow state.  LEFT ATRIAL APPENDAGE:  The large left atrial appendage is free of any thrombus or masses. The appendage has single lobes. Pulse doppler indicates high flow in the appendage.  ATRIAL SEPTUM:  The atrial septum appears intact and is free  of thrombus and/or masses.  There is no evidence for interatrial shunting by color dopple.  RIGHT ATRIUM:  The right atrium is mildly dilated in size and function without any thrombus or masses.  MITRAL VALVE:  The mitral valve is normal in structure and function with Mild regurgitation.  There were no vegetations or stenosis.  AORTIC VALVE:  The aortic valve is trileaflet, normal in structure and function with no regurgitation.  There were no vegetations or stenosis  TRICUSPID VALVE:  The tricuspid valve is normal in structure and function with mild regurgitation.  There were no vegetations or stenosis   PULMONIC VALVE:  The pulmonic valve is normal in structure and function with trivial regurgitation.  There were no vegetations or stenosis.   AORTIC ARCH, ASCENDING AND DESCENDING AORTA:  There was no Ardell Koller et. Al, 1992) atherosclerosis of the ascending aorta, aortic arch, or proximal descending aorta.  12. PULMONARY VEINS: Anomalous pulmonary venous return was not noted.  13. PERICARDIUM: The pericardium appeared normal and non-thickened.  There is a trivial pericardial effusion.  IMPRESSION:   No endocarditis No LAA thrombus Negative for PFO Mild MR, TR Trivial pericardial effusion Mild biatrial enlargement LVEF 70-75% with normal wall motion  RECOMMENDATIONS:     Management per ID recommendations for fungemia without endocarditis.  Time Spent Directly with the Patient:  45 minutes   Hazle Lites, MD, [facility], FNLA, FACP  [place]  [facility]  Medical Director of the [facility]  [facility] Reduction [facility] Diplomate of the American Board of Clinical Lipidology Attending Cardiologist  Direct Dial : [phone]  Fax: [phone]  Website:  [email or web address] 01/09/2024, 10:44 AM

## 2024-01-09 NOTE — Anesthesia Postprocedure Evaluation (Signed)
 Anesthesia Post Note  Patient: Kalena Mander  Procedure(s) Performed: TRANSESOPHAGEAL ECHOCARDIOGRAM     Patient location during evaluation: PACU Anesthesia Type: MAC Level of consciousness: awake and alert Pain management: pain level controlled Vital Signs Assessment: post-procedure vital signs reviewed and stable Respiratory status: spontaneous breathing, nonlabored ventilation, respiratory function stable and patient connected to nasal cannula oxygen Cardiovascular status: stable and blood pressure returned to baseline Postop Assessment: no apparent nausea or vomiting Anesthetic complications: no   No notable events documented.  Last Vitals:  Vitals:   01/09/24 1110 01/09/24 1144  BP: (!) 156/74 (!) 166/77  Pulse: 72 73  Resp: 16 17  Temp:  36.8 C  SpO2: 93% 95%    Last Pain:  Vitals:   01/09/24 1144  TempSrc: Oral  PainSc:                  Valente Gaskin Batsheva Stevick

## 2024-01-09 NOTE — Interval H&P Note (Signed)
 History and Physical Interval Note:  01/09/2024 9:57 AM  Carol [name]  has presented today for surgery, with the diagnosis of bacteremia.  The various methods of treatment have been discussed with the patient and family. After consideration of risks, benefits and other options for treatment, the patient has consented to  Procedure(s): TRANSESOPHAGEAL ECHOCARDIOGRAM (N/A) as a surgical intervention.  The patient's history has been reviewed, patient examined, no change in status, stable for surgery.  I have reviewed the patient's chart and labs.  Questions were answered to the patient's satisfaction.     Hazle Lites

## 2024-01-10 ENCOUNTER — Inpatient Hospital Stay (HOSPITAL_COMMUNITY): Admitting: Anesthesiology

## 2024-01-10 ENCOUNTER — Encounter (HOSPITAL_COMMUNITY)
Admission: EM | Disposition: A | Discharge status: Home Health | Payer: Self-pay | Source: Home / Self Care | Attending: Family Medicine

## 2024-01-10 DIAGNOSIS — I5033 Acute on chronic diastolic (congestive) heart failure: Secondary | ICD-10-CM | POA: Diagnosis not present

## 2024-01-10 DIAGNOSIS — R6520 Severe sepsis without septic shock: Secondary | ICD-10-CM | POA: Diagnosis not present

## 2024-01-10 DIAGNOSIS — Z992 Dependence on renal dialysis: Secondary | ICD-10-CM | POA: Diagnosis not present

## 2024-01-10 DIAGNOSIS — R7881 Bacteremia: Secondary | ICD-10-CM

## 2024-01-10 DIAGNOSIS — N186 End stage renal disease: Secondary | ICD-10-CM | POA: Diagnosis not present

## 2024-01-10 DIAGNOSIS — Z5331 Laparoscopic surgical procedure converted to open procedure: Secondary | ICD-10-CM

## 2024-01-10 DIAGNOSIS — B9689 Other specified bacterial agents as the cause of diseases classified elsewhere: Secondary | ICD-10-CM | POA: Diagnosis not present

## 2024-01-10 DIAGNOSIS — K819 Cholecystitis, unspecified: Secondary | ICD-10-CM

## 2024-01-10 DIAGNOSIS — B377 Candidal sepsis: Secondary | ICD-10-CM | POA: Diagnosis not present

## 2024-01-10 DIAGNOSIS — N184 Chronic kidney disease, stage 4 (severe): Secondary | ICD-10-CM

## 2024-01-10 DIAGNOSIS — I130 Hypertensive heart and chronic kidney disease with heart failure and stage 1 through stage 4 chronic kidney disease, or unspecified chronic kidney disease: Secondary | ICD-10-CM

## 2024-01-10 HISTORY — PX: INDOCYANINE GREEN FLUORESCENCE IMAGING (ICG): SHX7595

## 2024-01-10 HISTORY — PX: CHOLECYSTECTOMY: SHX55

## 2024-01-10 LAB — POCT I-STAT, CHEM 8
BUN: 30 mg/dL — ABNORMAL HIGH (ref 8–23)
Calcium, Ion: 0.99 mmol/L — ABNORMAL LOW (ref 1.15–1.40)
Chloride: 104 mmol/L (ref 98–111)
Creatinine, Ser: 4.9 mg/dL — ABNORMAL HIGH (ref 0.44–1.00)
Glucose, Bld: 75 mg/dL (ref 70–99)
HCT: 29 % — ABNORMAL LOW (ref 36.0–46.0)
Hemoglobin: 9.9 g/dL — ABNORMAL LOW (ref 12.0–15.0)
Potassium: 3.6 mmol/L (ref 3.5–5.1)
Sodium: 137 mmol/L (ref 135–145)
TCO2: 21 mmol/L — ABNORMAL LOW (ref 22–32)

## 2024-01-10 LAB — COMPREHENSIVE METABOLIC PANEL WITH GFR
ALT: 14 U/L (ref 0–44)
AST: 51 U/L — ABNORMAL HIGH (ref 15–41)
Albumin: 2.7 g/dL — ABNORMAL LOW (ref 3.5–5.0)
Alkaline Phosphatase: 119 U/L (ref 38–126)
Anion gap: 18 — ABNORMAL HIGH (ref 5–15)
BUN: 31 mg/dL — ABNORMAL HIGH (ref 8–23)
CO2: 20 mmol/L — ABNORMAL LOW (ref 22–32)
Calcium: 9 mg/dL (ref 8.9–10.3)
Chloride: 97 mmol/L — ABNORMAL LOW (ref 98–111)
Creatinine, Ser: 6.03 mg/dL — ABNORMAL HIGH (ref 0.44–1.00)
GFR, Estimated: 7 mL/min — ABNORMAL LOW (ref >60)
Glucose, Bld: 163 mg/dL — ABNORMAL HIGH (ref 70–99)
Potassium: 3.9 mmol/L (ref 3.5–5.1)
Sodium: 135 mmol/L (ref 135–145)
Total Bilirubin: 0.9 mg/dL (ref 0.0–1.2)
Total Protein: 7.1 g/dL (ref 6.5–8.1)

## 2024-01-10 LAB — TYPE AND SCREEN
ABO/RH(D): B POS
Antibody Screen: NEGATIVE

## 2024-01-10 LAB — RENAL FUNCTION PANEL
Albumin: 2.8 g/dL — ABNORMAL LOW (ref 3.5–5.0)
Anion gap: 16 — ABNORMAL HIGH (ref 5–15)
BUN: 32 mg/dL — ABNORMAL HIGH (ref 8–23)
CO2: 20 mmol/L — ABNORMAL LOW (ref 22–32)
Calcium: 8.8 mg/dL — ABNORMAL LOW (ref 8.9–10.3)
Chloride: 97 mmol/L — ABNORMAL LOW (ref 98–111)
Creatinine, Ser: 5.87 mg/dL — ABNORMAL HIGH (ref 0.44–1.00)
GFR, Estimated: 8 mL/min — ABNORMAL LOW (ref >60)
Glucose, Bld: 161 mg/dL — ABNORMAL HIGH (ref 70–99)
Phosphorus: 4.5 mg/dL (ref 2.5–4.6)
Potassium: 3.9 mmol/L (ref 3.5–5.1)
Sodium: 133 mmol/L — ABNORMAL LOW (ref 135–145)

## 2024-01-10 LAB — GLUCOSE, CAPILLARY
Glucose-Capillary: 75 mg/dL (ref 70–99)
Glucose-Capillary: 81 mg/dL (ref 70–99)
Glucose-Capillary: 136 mg/dL — ABNORMAL HIGH (ref 70–99)

## 2024-01-10 LAB — CULTURE, BLOOD (ROUTINE X 2): Special Requests: ADEQUATE

## 2024-01-10 SURGERY — LAPAROSCOPIC CHOLECYSTECTOMY
Anesthesia: General | Site: Abdomen

## 2024-01-10 MED ORDER — GLYCOPYRROLATE PF 0.2 MG/ML IJ SOSY
PREFILLED_SYRINGE | INTRAMUSCULAR | Status: AC
  Filled 2024-01-10: qty 1

## 2024-01-10 MED ORDER — ROCURONIUM BROMIDE 10 MG/ML (PF) SYRINGE
PREFILLED_SYRINGE | INTRAVENOUS | Status: DC | PRN
  Administered 2024-01-10: 70 mg via INTRAVENOUS
  Administered 2024-01-10 (×4): 10 mg via INTRAVENOUS

## 2024-01-10 MED ORDER — LIDOCAINE 2% (20 MG/ML) 5 ML SYRINGE
INTRAMUSCULAR | Status: DC | PRN
  Administered 2024-01-10: 60 mg via INTRAVENOUS

## 2024-01-10 MED ORDER — BUPIVACAINE-EPINEPHRINE (PF) 0.25% -1:200000 IJ SOLN
INTRAMUSCULAR | Status: DC | PRN
  Administered 2024-01-10: 10 mL

## 2024-01-10 MED ORDER — EPINEPHRINE 1 MG/10ML IJ SOSY
PREFILLED_SYRINGE | INTRAMUSCULAR | Status: AC
  Filled 2024-01-10: qty 10

## 2024-01-10 MED ORDER — ONDANSETRON HCL 4 MG/2ML IJ SOLN
INTRAMUSCULAR | Status: AC
  Filled 2024-01-10: qty 2

## 2024-01-10 MED ORDER — MEPERIDINE HCL 25 MG/ML IJ SOLN: 6.2500 mg | INTRAMUSCULAR | Status: DC | PRN

## 2024-01-10 MED ORDER — FENTANYL CITRATE (PF) 250 MCG/5ML IJ SOLN
INTRAMUSCULAR | Status: AC
  Filled 2024-01-10: qty 5

## 2024-01-10 MED ORDER — MIDAZOLAM HCL 2 MG/2ML IJ SOLN
INTRAMUSCULAR | Status: DC | PRN
  Administered 2024-01-10: 2 mg via INTRAVENOUS

## 2024-01-10 MED ORDER — DEXAMETHASONE SODIUM PHOSPHATE 10 MG/ML IJ SOLN
INTRAMUSCULAR | Status: AC
  Filled 2024-01-10: qty 1

## 2024-01-10 MED ORDER — BUPIVACAINE-EPINEPHRINE (PF) 0.25% -1:200000 IJ SOLN
INTRAMUSCULAR | Status: AC
  Filled 2024-01-10: qty 30

## 2024-01-10 MED ORDER — HEPARIN SODIUM (PORCINE) 1000 UNIT/ML IJ SOLN
INTRAMUSCULAR | Status: AC
  Filled 2024-01-10: qty 4

## 2024-01-10 MED ORDER — LIDOCAINE 2% (20 MG/ML) 5 ML SYRINGE
INTRAMUSCULAR | Status: AC
  Filled 2024-01-10: qty 5

## 2024-01-10 MED ORDER — PROPOFOL 10 MG/ML IV BOLUS
INTRAVENOUS | Status: AC
  Filled 2024-01-10: qty 20

## 2024-01-10 MED ORDER — SODIUM CHLORIDE 0.9 % IV SOLN: INTRAVENOUS | Status: DC | PRN

## 2024-01-10 MED ORDER — FENTANYL CITRATE (PF) 100 MCG/2ML IJ SOLN: 25.0000 ug | INTRAMUSCULAR | Status: DC | PRN

## 2024-01-10 MED ORDER — ROCURONIUM BROMIDE 10 MG/ML (PF) SYRINGE
PREFILLED_SYRINGE | INTRAVENOUS | Status: AC
  Filled 2024-01-10: qty 10

## 2024-01-10 MED ORDER — EPHEDRINE SULFATE-NACL 50-0.9 MG/10ML-% IV SOSY
PREFILLED_SYRINGE | INTRAVENOUS | Status: DC | PRN
  Administered 2024-01-10: 10 mg via INTRAVENOUS
  Administered 2024-01-10: 5 mg via INTRAVENOUS
  Administered 2024-01-10: 25 mg via INTRAVENOUS
  Administered 2024-01-10: 10 mg via INTRAVENOUS

## 2024-01-10 MED ORDER — ORAL CARE MOUTH RINSE: 15.0000 mL | Freq: Once | OROMUCOSAL | Status: AC

## 2024-01-10 MED ORDER — PROPOFOL 10 MG/ML IV BOLUS
INTRAVENOUS | Status: DC | PRN
  Administered 2024-01-10: 160 mg via INTRAVENOUS

## 2024-01-10 MED ORDER — SUCCINYLCHOLINE CHLORIDE 200 MG/10ML IV SOSY
PREFILLED_SYRINGE | INTRAVENOUS | Status: AC
  Filled 2024-01-10: qty 10

## 2024-01-10 MED ORDER — HEPARIN SODIUM (PORCINE) 1000 UNIT/ML DIALYSIS: 1000.0000 [IU] | INTRAMUSCULAR | Status: DC | PRN

## 2024-01-10 MED ORDER — METRONIDAZOLE 500 MG PO TABS
500.0000 mg | ORAL_TABLET | Freq: Two times a day (BID) | ORAL | Status: DC
  Administered 2024-01-11 – 2024-01-16 (×11): 500 mg via ORAL
  Filled 2024-01-10 (×12): qty 1

## 2024-01-10 MED ORDER — VASOPRESSIN 20 UNIT/ML IV SOLN
INTRAVENOUS | Status: DC | PRN
  Administered 2024-01-10: 5 [IU] via INTRAVENOUS

## 2024-01-10 MED ORDER — MIDAZOLAM HCL 2 MG/2ML IJ SOLN
INTRAMUSCULAR | Status: AC
  Filled 2024-01-10: qty 2

## 2024-01-10 MED ORDER — PHENYLEPHRINE 80 MCG/ML (10ML) SYRINGE FOR IV PUSH (FOR BLOOD PRESSURE SUPPORT)
PREFILLED_SYRINGE | INTRAVENOUS | Status: AC
  Filled 2024-01-10: qty 10

## 2024-01-10 MED ORDER — HEMOSTATIC AGENTS (NO CHARGE) OPTIME
TOPICAL | Status: DC | PRN
  Administered 2024-01-10: 1

## 2024-01-10 MED ORDER — PENTAFLUOROPROP-TETRAFLUOROETH EX AERO: 1.0000 | INHALATION_SPRAY | CUTANEOUS | Status: DC | PRN

## 2024-01-10 MED ORDER — FENTANYL CITRATE (PF) 250 MCG/5ML IJ SOLN
INTRAMUSCULAR | Status: DC | PRN
  Administered 2024-01-10 (×2): 50 ug via INTRAVENOUS
  Administered 2024-01-10: 75 ug via INTRAVENOUS

## 2024-01-10 MED ORDER — ACETAMINOPHEN 10 MG/ML IV SOLN
INTRAVENOUS | Status: DC | PRN
  Administered 2024-01-10: 1000 mg via INTRAVENOUS

## 2024-01-10 MED ORDER — EPHEDRINE 5 MG/ML INJ
INTRAVENOUS | Status: AC
  Filled 2024-01-10: qty 5

## 2024-01-10 MED ORDER — CHLORHEXIDINE GLUCONATE 0.12 % MT SOLN
OROMUCOSAL | Status: AC
  Administered 2024-01-10: 15 mL via OROMUCOSAL
  Filled 2024-01-10: qty 15

## 2024-01-10 MED ORDER — ESMOLOL HCL 100 MG/10ML IV SOLN
INTRAVENOUS | Status: AC
  Filled 2024-01-10: qty 10

## 2024-01-10 MED ORDER — HEPARIN SODIUM (PORCINE) 1000 UNIT/ML DIALYSIS
2000.0000 [IU] | INTRAMUSCULAR | Status: DC | PRN
  Filled 2024-01-10: qty 2

## 2024-01-10 MED ORDER — OXYCODONE HCL 5 MG PO TABS: 5.0000 mg | ORAL_TABLET | Freq: Once | ORAL | Status: DC | PRN

## 2024-01-10 MED ORDER — ANTICOAGULANT SODIUM CITRATE 4% (200MG/5ML) IV SOLN
5.0000 mL | Status: DC | PRN
  Filled 2024-01-10: qty 5

## 2024-01-10 MED ORDER — SUGAMMADEX SODIUM 200 MG/2ML IV SOLN
INTRAVENOUS | Status: DC | PRN
  Administered 2024-01-10: 200 mg via INTRAVENOUS

## 2024-01-10 MED ORDER — SODIUM CHLORIDE 0.9 % IV SOLN
500.0000 mg | INTRAVENOUS | Status: AC
  Administered 2024-01-10: 500 mg via INTRAVENOUS
  Filled 2024-01-10: qty 10

## 2024-01-10 MED ORDER — ACETAMINOPHEN 10 MG/ML IV SOLN
INTRAVENOUS | Status: AC
  Filled 2024-01-10: qty 100

## 2024-01-10 MED ORDER — LIDOCAINE-PRILOCAINE 2.5-2.5 % EX CREA: 1.0000 | TOPICAL_CREAM | CUTANEOUS | Status: DC | PRN

## 2024-01-10 MED ORDER — INDOCYANINE GREEN 25 MG IV SOLR
2.5000 mg | Freq: Once | INTRAVENOUS | Status: AC
  Administered 2024-01-10: 2.5 mg via INTRAVENOUS

## 2024-01-10 MED ORDER — SODIUM CHLORIDE 0.9 % IR SOLN
Status: DC | PRN
  Administered 2024-01-10: 1000 mL

## 2024-01-10 MED ORDER — OXYCODONE HCL 5 MG/5ML PO SOLN: 5.0000 mg | Freq: Once | ORAL | Status: DC | PRN

## 2024-01-10 MED ORDER — CHLORHEXIDINE GLUCONATE 0.12 % MT SOLN: 15.0000 mL | Freq: Once | OROMUCOSAL | Status: AC

## 2024-01-10 MED ORDER — PHENYLEPHRINE HCL-NACL 20-0.9 MG/250ML-% IV SOLN
INTRAVENOUS | Status: DC | PRN
  Administered 2024-01-10: 20 ug/min via INTRAVENOUS

## 2024-01-10 MED ORDER — VASOPRESSIN 20 UNIT/ML IV SOLN
INTRAVENOUS | Status: AC
  Filled 2024-01-10: qty 1

## 2024-01-10 MED ORDER — LACTATED RINGERS IV SOLN
INTRAVENOUS | Status: DC
Start: 2024-01-10 — End: 2024-01-10

## 2024-01-10 MED ORDER — ATROPINE SULFATE 0.4 MG/ML IV SOLN
INTRAVENOUS | Status: AC
  Filled 2024-01-10: qty 1

## 2024-01-10 MED ORDER — 0.9 % SODIUM CHLORIDE (POUR BTL) OPTIME
TOPICAL | Status: DC | PRN
  Administered 2024-01-10: 4000 mL
  Administered 2024-01-10: 1000 mL

## 2024-01-10 MED ORDER — ALTEPLASE 2 MG IJ SOLR: 2.0000 mg | Freq: Once | INTRAMUSCULAR | Status: DC | PRN

## 2024-01-10 MED ORDER — DEXAMETHASONE SODIUM PHOSPHATE 10 MG/ML IJ SOLN
INTRAMUSCULAR | Status: DC | PRN
  Administered 2024-01-10: 10 mg via INTRAVENOUS

## 2024-01-10 MED ORDER — CIPROFLOXACIN HCL 500 MG PO TABS
500.0000 mg | ORAL_TABLET | ORAL | Status: DC
  Administered 2024-01-11 – 2024-01-14 (×5): 500 mg via ORAL
  Filled 2024-01-10 (×5): qty 1

## 2024-01-10 MED ORDER — LIDOCAINE HCL (PF) 1 % IJ SOLN: 5.0000 mL | INTRAMUSCULAR | Status: DC | PRN

## 2024-01-10 MED ORDER — HYDROMORPHONE HCL 1 MG/ML IJ SOLN
1.0000 mg | INTRAMUSCULAR | Status: DC | PRN
  Administered 2024-01-10 – 2024-01-11 (×4): 1 mg via INTRAVENOUS
  Filled 2024-01-10 (×4): qty 1

## 2024-01-10 SURGICAL SUPPLY — 47 items
BAG COUNTER SPONGE SURGICOUNT (BAG) ×1 IMPLANT
BLADE SURG 10 STRL SS (BLADE) IMPLANT
CANISTER SUCTION 3000ML PPV (SUCTIONS) ×1 IMPLANT
CHLORAPREP W/TINT 26 (MISCELLANEOUS) ×1 IMPLANT
CLIP APPLIE ROT 10 11.4 M/L (STAPLE) ×1 IMPLANT
COVER SURGICAL LIGHT HANDLE (MISCELLANEOUS) ×1 IMPLANT
DERMABOND ADVANCED .7 DNX12 (GAUZE/BANDAGES/DRESSINGS) ×1 IMPLANT
DRSG OPSITE POSTOP 4X10 (GAUZE/BANDAGES/DRESSINGS) IMPLANT
ELECTRODE REM PT RTRN 9FT ADLT (ELECTROSURGICAL) ×1 IMPLANT
GLOVE BIO SURGEON STRL SZ7 (GLOVE) ×1 IMPLANT
GOWN STRL REUS W/ TWL LRG LVL3 (GOWN DISPOSABLE) ×2 IMPLANT
GOWN STRL REUS W/ TWL XL LVL3 (GOWN DISPOSABLE) ×1 IMPLANT
GRASPER SUT TROCAR 14GX15 (MISCELLANEOUS) ×1 IMPLANT
HANDLE SUCTION POOLE (INSTRUMENTS) IMPLANT
HEMOSTAT NU-KNIT SURGICAL 3X4 (HEMOSTASIS) IMPLANT
IRRIGATION SUCT STRKRFLW 2 WTP (MISCELLANEOUS) ×1 IMPLANT
KIT BASIN OR (CUSTOM PROCEDURE TRAY) ×1 IMPLANT
KIT IMAGING PINPOINTPAQ (MISCELLANEOUS) IMPLANT
KIT TURNOVER KIT B (KITS) ×1 IMPLANT
LHOOK LAP DISP 36CM (ELECTROSURGICAL) ×1 IMPLANT
NDL 22X1.5 STRL (OR ONLY) (MISCELLANEOUS) ×1 IMPLANT
NDL INSUFFLATION 14GA 120MM (NEEDLE) ×1 IMPLANT
NEEDLE 22X1.5 STRL (OR ONLY) (MISCELLANEOUS) ×1 IMPLANT
NEEDLE INSUFFLATION 14GA 120MM (NEEDLE) ×1 IMPLANT
NS IRRIG 1000ML POUR BTL (IV SOLUTION) ×1 IMPLANT
PAD ARMBOARD POSITIONER FOAM (MISCELLANEOUS) ×1 IMPLANT
PENCIL BUTTON HOLSTER BLD 10FT (ELECTRODE) ×1 IMPLANT
POUCH RETRIEVAL ECOSAC 10 (ENDOMECHANICALS) ×1 IMPLANT
SCISSORS LAP 5X35 DISP (ENDOMECHANICALS) ×1 IMPLANT
SET TUBE SMOKE EVAC HIGH FLOW (TUBING) ×1 IMPLANT
SLEEVE Z-THREAD 5X100MM (TROCAR) ×2 IMPLANT
SPONGE T-LAP 18X18 ~~LOC~~+RFID (SPONGE) IMPLANT
STAPLER SKIN PROX 35W (STAPLE) IMPLANT
SUT MNCRL AB 4-0 PS2 18 (SUTURE) ×1 IMPLANT
SUT PDS AB 1 TP1 54 (SUTURE) IMPLANT
SUT SILK 2 0 SH CR/8 (SUTURE) IMPLANT
SUT SILK 2 0 TIES 10X30 (SUTURE) IMPLANT
SUT SILK 3 0 SH CR/8 (SUTURE) IMPLANT
SUT SILK 3 0 TIES 10X30 (SUTURE) IMPLANT
TOWEL GREEN STERILE (TOWEL DISPOSABLE) ×1 IMPLANT
TOWEL GREEN STERILE FF (TOWEL DISPOSABLE) ×1 IMPLANT
TRAY LAPAROSCOPIC MC (CUSTOM PROCEDURE TRAY) ×1 IMPLANT
TROCAR Z THREAD OPTICAL 12X100 (TROCAR) ×1 IMPLANT
TROCAR Z-THREAD OPTICAL 5X100M (TROCAR) ×1 IMPLANT
TUBE CONNECTING 12X1/4 (SUCTIONS) IMPLANT
WARMER LAPAROSCOPE (MISCELLANEOUS) ×1 IMPLANT
WATER STERILE IRR 1000ML POUR (IV SOLUTION) ×1 IMPLANT

## 2024-01-10 NOTE — Progress Notes (Signed)
 PROGRESS NOTE    Jannett Schmall  ZOX:[id number] DOB: [birth year] DOA: 01/03/2024 PCP: [facility]   Brief Narrative: Trinitee Horgan is a 62 y.o. female with a history of ESRD on hemodialysis, hyperlipidemia, GERD, pulm hypertension.  Patient presented secondary to upper chest pain and shortness of breath in addition to fever and chills and found to have evidence of sepsis secondary to candidemia.  Infectious ease consulted.  Hospitalization also complicated by evidence of cholelithiasis s/p laparoscopic cholecystectomy on 5/23.  Repeat blood cultures growing GNRs.   Assessment and Plan:  Sepsis Present on admission secondary to candidia tropicalis candidemia.  Fungemia In setting of HD cath. Noted on blood cultures (5/16 and 5/17). Cultures positive for candida tropicalis. ID consulted during this admission for management. Patient managed on Micafungin . Prior to admission [facility] removed on 5/19 and new TDC placed on 5/21.  Transthoracic Echocardiogram performed on 5/22 significant for no vegetations. -ID recommendations: micafungin  with plan to transition to fluconazole, ophthalmology exam outpatient  Positive blood cultures Blood cultures (5/19) significant for GNRs. Patient has been on Ceftriaxone  prior to cultures. Presumed likely secondary to gallbladder and concern for cholecystitis. -Continue Ceftriaxone  -Follow-up culture sensitivities  Cholelithiasis Possible cholecystitis General surgery consulted. Patient underwent laparoscopic cholecystectomy on 5/23. -Continue Ceftriaxone  and Flagyl   ESRD on hemodialysis Nephrology consulted for HD during hospitalization. Right internal jugular tunneled HD catheter removed on 5/19 and new right internal jugular tunneled HD catheter placed on 5/21.  Severe asymptomatic hypertension Improved with antihypertensive therapy and hemodialysis. -Continue amlodipine   Anemia of chronic disease Chronic and  stable.  Thyroid  nodule Noted incidentally on CT scan imaging. Outpatient follow-up    DVT prophylaxis: Subcutaneous heparin  Code Status:   Code Status: Full Code Family Communication: None at bedside Disposition Plan: Discharge pending continued ID/general surgery recommendations/management   Consultants:  Nephrology Infectious disease General surgery Interventional radiology  Procedures:  TDC removal TDC placement Hemodialysis Transthoracic Echocardiogram Transesophageal Echocardiogram Laparoscopic cholecystectomy  Antimicrobials: Vancomycin Micafungin  Flagyl  Ceftriaxone  Azithromycin     Subjective: Patient seen in PACU. Some shortness of breath after surgery. Oxygen applied in PACU. No other concerns.  Objective: BP (!) 167/77   Pulse 68   Temp 98.1 F (36.7 C) (Oral)   Resp 18   Ht 5\' 4"  (1.626 m)   Wt 67.1 kg   SpO2 96%   BMI 25.40 kg/m   Examination:  General exam: Appears calm and comfortable Respiratory system: Clear to auscultation. Respiratory effort normal. Cardiovascular system: S1 & S2 heard, RRR. Systolic murmur. Gastrointestinal system: Abdomen is nondistended, soft. Normal bowel sounds heard. Central nervous system: Somnolent but easily arouses. Musculoskeletal: No edema. No calf tenderness   Data Reviewed: I have personally reviewed following labs and imaging studies  CBC Lab Results  Component Value Date   WBC 5.5 01/09/2024   RBC 3.11 (L) 01/09/2024   HGB 9.9 (L) 01/10/2024   HCT 29.0 (L) 01/10/2024   MCV 88.7 01/09/2024   MCH 29.3 01/09/2024   PLT 136 (L) 01/09/2024   MCHC 33.0 01/09/2024   RDW 16.4 (H) 01/09/2024   LYMPHSABS 0.2 (L) 01/03/2024   MONOABS 0.0 (L) 01/03/2024   EOSABS 0.1 01/03/2024   BASOSABS 0.0 01/03/2024     Last metabolic panel Lab Results  Component Value Date   NA 137 01/10/2024   K 3.6 01/10/2024   CL 104 01/10/2024   CO2 22 01/09/2024   BUN 30 (H) 01/10/2024   CREATININE 4.90 (H)  01/10/2024   GLUCOSE 75  01/10/2024   GFRNONAA 11 (L) 01/09/2024   GFRAA 50 (L) 11/21/2020   CALCIUM  8.7 (L) 01/09/2024   PHOS 3.6 01/09/2024   PROT 6.9 01/05/2024   ALBUMIN  2.5 (L) 01/09/2024   LABGLOB 3.0 03/24/2023   AGRATIO 1.0 03/24/2023   BILITOT 0.6 01/05/2024   ALKPHOS 128 (H) 01/05/2024   AST 38 01/05/2024   ALT 29 01/05/2024   ANIONGAP 12 01/09/2024    GFR: Estimated Creatinine Clearance: 11.4 mL/min (A) (by C-G formula based on SCr of 4.9 mg/dL (H)).  Recent Results (from the past 240 hours)  Resp panel by RT-PCR (RSV, Flu A&B, Covid) Anterior Nasal Swab     Status: None   Collection Time: 01/03/24  1:33 PM   Specimen: Anterior Nasal Swab  Result Value Ref Range Status   SARS Coronavirus 2 by RT PCR NEGATIVE NEGATIVE Final   Influenza A by PCR NEGATIVE NEGATIVE Final   Influenza B by PCR NEGATIVE NEGATIVE Final    Comment: (NOTE) The Xpert Xpress SARS-CoV-2/FLU/RSV plus assay is intended as an aid in the diagnosis of influenza from Nasopharyngeal swab specimens and should not be used as a sole basis for treatment. Nasal washings and aspirates are unacceptable for Xpert Xpress SARS-CoV-2/FLU/RSV testing.  Fact Sheet for Patients: BloggerCourse.com  Fact Sheet for Healthcare Providers: SeriousBroker.it  This test is not yet approved or cleared by the [place]  FDA and has been authorized for detection and/or diagnosis of SARS-CoV-2 by FDA under an Emergency Use Authorization (EUA). This EUA will remain in effect (meaning this test can be used) for the duration of the COVID-19 declaration under Section 564(b)(1) of the Act, 21 U.S.C. section 360bbb-3(b)(1), unless the authorization is terminated or revoked.     Resp Syncytial Virus by PCR NEGATIVE NEGATIVE Final    Comment: (NOTE) Fact Sheet for Patients: BloggerCourse.com  Fact Sheet for Healthcare  Providers: SeriousBroker.it  This test is not yet approved or cleared by the [place]  FDA and has been authorized for detection and/or diagnosis of SARS-CoV-2 by FDA under an Emergency Use Authorization (EUA). This EUA will remain in effect (meaning this test can be used) for the duration of the COVID-19 declaration under Section 564(b)(1) of the Act, 21 U.S.C. section 360bbb-3(b)(1), unless the authorization is terminated or revoked.  Performed at [facility], [address], [place], [place] [address]   Blood culture (routine x 2)     Status: Abnormal (Preliminary result)   Collection Time: 01/03/24  2:59 PM   Specimen: BLOOD RIGHT FOREARM  Result Value Ref Range Status   Specimen Description BLOOD RIGHT FOREARM  Final   Special Requests   Final    BOTTLES DRAWN AEROBIC AND ANAEROBIC Blood Culture adequate volume   Culture  Setup Time   Final    YEAST IN BOTH AEROBIC AND ANAEROBIC BOTTLES CRITICAL RESULT CALLED TO, READ BACK BY AND VERIFIED WITH: PHARMD AUSTIN PAYTES [id number] 1938 BY J RAZZAK, MT    Culture (A)  Final    CANDIDA TROPICALIS Sent to [facility] for further susceptibility testing. Performed at [facility], [address] [place], [place] [address]    Report Status PENDING  Incomplete  Blood Culture ID Panel (Reflexed)     Status: Abnormal   Collection Time: 01/03/24  2:59 PM  Result Value Ref Range Status   Enterococcus faecalis NOT DETECTED NOT DETECTED Final   Enterococcus Faecium NOT DETECTED NOT DETECTED Final   Listeria monocytogenes NOT DETECTED NOT DETECTED Final  Staphylococcus species NOT DETECTED NOT DETECTED Final   Staphylococcus aureus (BCID) NOT DETECTED NOT DETECTED Final   Staphylococcus epidermidis NOT DETECTED NOT DETECTED Final   Staphylococcus lugdunensis NOT DETECTED NOT DETECTED Final   Streptococcus species NOT DETECTED NOT DETECTED Final   Streptococcus agalactiae NOT DETECTED NOT DETECTED  Final   Streptococcus pneumoniae NOT DETECTED NOT DETECTED Final   Streptococcus pyogenes NOT DETECTED NOT DETECTED Final   A.calcoaceticus-baumannii NOT DETECTED NOT DETECTED Final   Bacteroides fragilis NOT DETECTED NOT DETECTED Final   Enterobacterales NOT DETECTED NOT DETECTED Final   Enterobacter cloacae complex NOT DETECTED NOT DETECTED Final   Escherichia coli NOT DETECTED NOT DETECTED Final   Klebsiella aerogenes NOT DETECTED NOT DETECTED Final   Klebsiella oxytoca NOT DETECTED NOT DETECTED Final   Klebsiella pneumoniae NOT DETECTED NOT DETECTED Final   Proteus species NOT DETECTED NOT DETECTED Final   Salmonella species NOT DETECTED NOT DETECTED Final   Serratia marcescens NOT DETECTED NOT DETECTED Final   Haemophilus influenzae NOT DETECTED NOT DETECTED Final   Neisseria meningitidis NOT DETECTED NOT DETECTED Final   Pseudomonas aeruginosa NOT DETECTED NOT DETECTED Final   Stenotrophomonas maltophilia NOT DETECTED NOT DETECTED Final   Candida albicans NOT DETECTED NOT DETECTED Final   Candida auris NOT DETECTED NOT DETECTED Final   Candida glabrata NOT DETECTED NOT DETECTED Final   Candida krusei NOT DETECTED NOT DETECTED Final   Candida parapsilosis NOT DETECTED NOT DETECTED Final   Candida tropicalis DETECTED (A) NOT DETECTED Final    Comment: CRITICAL RESULT CALLED TO, READ BACK BY AND VERIFIED WITH: PHARMD AUSTIN PAYTES [id number] 1938 BY J RAZZAK, MT    Cryptococcus neoformans/gattii NOT DETECTED NOT DETECTED Final    Comment: Performed at [facility], [address] [place], [place] [address]  Blood culture (routine x 2)     Status: Abnormal   Collection Time: 01/04/24  5:39 AM   Specimen: BLOOD RIGHT ARM  Result Value Ref Range Status   Specimen Description BLOOD RIGHT ARM  Final   Special Requests   Final    BOTTLES DRAWN AEROBIC AND ANAEROBIC Blood Culture adequate volume   Culture  Setup Time   Final    BUDDING YEAST SEEN AEROBIC BOTTLE ONLY Gram Stain  Report Called to,Read Back By and Verified With: PHARMD J LEDFORD 01/06/2024 @ 0059 BY AB Performed at [facility], [address], [place], [place] [address]    Culture CANDIDA TROPICALIS (A)  Final   Report Status 01/07/2024 FINAL  Final  Respiratory (~20 pathogens) panel by PCR     Status: None   Collection Time: 01/04/24 11:35 PM   Specimen: Nasopharyngeal Swab; Respiratory  Result Value Ref Range Status   Adenovirus NOT DETECTED NOT DETECTED Final   Coronavirus 229E NOT DETECTED NOT DETECTED Final    Comment: (NOTE) The Coronavirus on the Respiratory Panel, DOES NOT test for the novel  Coronavirus (2019 nCoV)    Coronavirus HKU1 NOT DETECTED NOT DETECTED Final   Coronavirus NL63 NOT DETECTED NOT DETECTED Final   Coronavirus OC43 NOT DETECTED NOT DETECTED Final   Metapneumovirus NOT DETECTED NOT DETECTED Final   Rhinovirus / Enterovirus NOT DETECTED NOT DETECTED Final   Influenza A NOT DETECTED NOT DETECTED Final   Influenza B NOT DETECTED NOT DETECTED Final   Parainfluenza Virus 1 NOT DETECTED NOT DETECTED Final   Parainfluenza Virus 2 NOT DETECTED NOT DETECTED Final   Parainfluenza Virus 3 NOT DETECTED NOT DETECTED  Final   Parainfluenza Virus 4 NOT DETECTED NOT DETECTED Final   Respiratory Syncytial Virus NOT DETECTED NOT DETECTED Final   Bordetella pertussis NOT DETECTED NOT DETECTED Final   Bordetella Parapertussis NOT DETECTED NOT DETECTED Final   Chlamydophila pneumoniae NOT DETECTED NOT DETECTED Final   Mycoplasma pneumoniae NOT DETECTED NOT DETECTED Final    Comment: Performed at [facility], [address], [place], [place] [address]  Culture, blood (Routine X 2) w Reflex to ID Panel     Status: Abnormal   Collection Time: 01/06/24  8:13 AM   Specimen: BLOOD  Result Value Ref Range Status   Specimen Description BLOOD SITE NOT SPECIFIED  Final   Special Requests   Final    BOTTLES DRAWN AEROBIC AND ANAEROBIC Blood Culture results may not be optimal due  to an inadequate volume of blood received in culture bottles   Culture  Setup Time   Final    GRAM NEGATIVE RODS BOTTLES DRAWN AEROBIC ONLY CRITICAL RESULT CALLED TO, READ BACK BY AND VERIFIED WITH: V BRYK,PHARMD@[id number]  01/09/24 MK    Culture (A)  Final    BURKHOLDERIA SPECIES BURKHOLDERIA CEPACIA GROUP Performed at [facility], [address] [place], [place] [address]    Report Status 01/10/2024 FINAL  Final   Organism ID, Bacteria BURKHOLDERIA SPECIES  Final      Susceptibility   Burkholderia species - MIC*    CEFEPIME >=32 RESISTANT Resistant     GENTAMICIN <=1 SENSITIVE Sensitive     CIPROFLOXACIN  <=0.25 SENSITIVE Sensitive     IMIPENEM 2 SENSITIVE Sensitive     TRIMETH/SULFA <=20 SENSITIVE Sensitive     * BURKHOLDERIA SPECIES  Culture, blood (Routine X 2) w Reflex to ID Panel     Status: None (Preliminary result)   Collection Time: 01/06/24  8:13 AM   Specimen: BLOOD  Result Value Ref Range Status   Specimen Description BLOOD SITE NOT SPECIFIED  Final   Special Requests   Final    BOTTLES DRAWN AEROBIC AND ANAEROBIC Blood Culture results may not be optimal due to an inadequate volume of blood received in culture bottles   Culture  Setup Time   Final    GRAM NEGATIVE RODS ANAEROBIC BOTTLE ONLY CRITICAL VALUE NOTED.  VALUE IS CONSISTENT WITH PREVIOUSLY REPORTED AND CALLED VALUE. Performed at [facility], [address] [place], [place] [address]    Culture GRAM NEGATIVE RODS  Final   Report Status PENDING  Incomplete  Blood Culture ID Panel (Reflexed)     Status: None   Collection Time: 01/06/24  8:13 AM  Result Value Ref Range Status   Enterococcus faecalis NOT DETECTED NOT DETECTED Final   Enterococcus Faecium NOT DETECTED NOT DETECTED Final   Listeria monocytogenes NOT DETECTED NOT DETECTED Final   Staphylococcus species NOT DETECTED NOT DETECTED Final   Staphylococcus aureus (BCID) NOT DETECTED NOT DETECTED Final   Staphylococcus epidermidis NOT  DETECTED NOT DETECTED Final   Staphylococcus lugdunensis NOT DETECTED NOT DETECTED Final   Streptococcus species NOT DETECTED NOT DETECTED Final   Streptococcus agalactiae NOT DETECTED NOT DETECTED Final   Streptococcus pneumoniae NOT DETECTED NOT DETECTED Final   Streptococcus pyogenes NOT DETECTED NOT DETECTED Final   A.calcoaceticus-baumannii NOT DETECTED NOT DETECTED Final   Bacteroides fragilis NOT DETECTED NOT DETECTED Final   Enterobacterales NOT DETECTED NOT DETECTED Final   Enterobacter cloacae complex NOT DETECTED NOT DETECTED Final   Escherichia coli NOT DETECTED NOT DETECTED  Final   Klebsiella aerogenes NOT DETECTED NOT DETECTED Final   Klebsiella oxytoca NOT DETECTED NOT DETECTED Final   Klebsiella pneumoniae NOT DETECTED NOT DETECTED Final   Proteus species NOT DETECTED NOT DETECTED Final   Salmonella species NOT DETECTED NOT DETECTED Final   Serratia marcescens NOT DETECTED NOT DETECTED Final   Haemophilus influenzae NOT DETECTED NOT DETECTED Final   Neisseria meningitidis NOT DETECTED NOT DETECTED Final   Pseudomonas aeruginosa NOT DETECTED NOT DETECTED Final   Stenotrophomonas maltophilia NOT DETECTED NOT DETECTED Final   Candida albicans NOT DETECTED NOT DETECTED Final   Candida auris NOT DETECTED NOT DETECTED Final   Candida glabrata NOT DETECTED NOT DETECTED Final   Candida krusei NOT DETECTED NOT DETECTED Final   Candida parapsilosis NOT DETECTED NOT DETECTED Final   Candida tropicalis NOT DETECTED NOT DETECTED Final   Cryptococcus neoformans/gattii NOT DETECTED NOT DETECTED Final    Comment: Performed at [facility], [address] [place], [place] [address]  Culture, blood (Routine X 2) w Reflex to ID Panel     Status: None (Preliminary result)   Collection Time: 01/06/24  8:22 PM   Specimen: BLOOD  Result Value Ref Range Status   Specimen Description BLOOD SITE NOT SPECIFIED  Final   Special Requests   Final    BOTTLES DRAWN AEROBIC AND ANAEROBIC  Blood Culture adequate volume   Culture  Setup Time   Final    GRAM NEGATIVE RODS AEROBIC BOTTLE ONLY CRITICAL VALUE NOTED.  VALUE IS CONSISTENT WITH PREVIOUSLY REPORTED AND CALLED VALUE. Performed at [facility], [address] [place], [place] [address]    Culture GRAM NEGATIVE RODS  Final   Report Status PENDING  Incomplete  Culture, blood (Routine X 2) w Reflex to ID Panel     Status: None (Preliminary result)   Collection Time: 01/06/24  8:22 PM   Specimen: BLOOD  Result Value Ref Range Status   Specimen Description BLOOD SITE NOT SPECIFIED  Final   Special Requests   Final    BOTTLES DRAWN AEROBIC AND ANAEROBIC Blood Culture adequate volume   Culture  Setup Time   Final    GRAM NEGATIVE RODS AEROBIC BOTTLE ONLY CRITICAL VALUE NOTED.  VALUE IS CONSISTENT WITH PREVIOUSLY REPORTED AND CALLED VALUE. Performed at [facility], [address] [place], [place] [address]    Culture GRAM NEGATIVE RODS  Final   Report Status PENDING  Incomplete  Culture, blood (Routine X 2) w Reflex to ID Panel     Status: None (Preliminary result)   Collection Time: 01/09/24  2:27 PM   Specimen: BLOOD  Result Value Ref Range Status   Specimen Description BLOOD SITE NOT SPECIFIED  Final   Special Requests   Final    BOTTLES DRAWN AEROBIC AND ANAEROBIC Blood Culture adequate volume   Culture   Final    NO GROWTH < 24 HOURS Performed at [facility], [address] [place], [place] [address]    Report Status PENDING  Incomplete  Culture, blood (Routine X 2) w Reflex to ID Panel     Status: None (Preliminary result)   Collection Time: 01/09/24  2:27 PM   Specimen: BLOOD  Result Value Ref Range Status   Specimen Description BLOOD SITE NOT SPECIFIED  Final   Special Requests   Final    BOTTLES DRAWN AEROBIC AND ANAEROBIC Blood Culture adequate volume   Culture   Final  NO GROWTH < 24 HOURS Performed at [facility], [address], [place], [place] [address]     Report Status PENDING  Incomplete      Radiology Studies: ECHO TEE Result Date: 01/09/2024    TRANSESOPHOGEAL ECHO REPORT   Patient Name:   GENEVER HENTGES Date of Exam: 01/09/2024 Medical Rec #:  [id number]               Height:       64.0 in Accession #:    [id number]              Weight:       168.7 lb Date of Birth:  06-20-1961               BSA:          1.820 m Patient Age:    63 years                BP:           170/81 mmHg Patient Gender: F                       HR:           75 bpm. Exam Location:  Inpatient Procedure: Transesophageal Echo, Cardiac Doppler and Color Doppler (Both            Spectral and Color Flow Doppler were utilized during procedure). Indications:     endocarditis  History:         Patient has prior history of Echocardiogram examinations, most                  recent 01/05/2024. CHF, CAD, COPD and End stage renal disease;                  Risk Factors:Hypertension and Diabetes.  Sonographer:     Kip Peon RDCS Referring Phys:  [id number] Dellis Fermo Diagnosing Phys: Dinah Franco MD PROCEDURE: After discussion of the risks and benefits of a TEE, an informed consent was obtained from the patient. The transesophogeal probe was passed without difficulty through the esophogus of the patient. Imaged were obtained with the patient in a left lateral decubitus position. Sedation performed by different physician. The patient was monitored while under deep sedation. Anesthestetic sedation was provided intravenously by Anesthesiology: 170mg  of Propofol . The patient developed no complications during the procedure.  IMPRESSIONS  1. Left ventricular ejection fraction, by estimation, is 70 to 75%. The left ventricle has hyperdynamic function. There is mild left ventricular hypertrophy.  2. Right ventricular systolic function is normal. The right ventricular size is normal.  3. Left atrial size was mildly dilated. No left atrial/left atrial appendage thrombus was detected.  4. Right atrial  size was mildly dilated.  5. The mitral valve is grossly normal. Mild mitral valve regurgitation.  6. The aortic valve is tricuspid. Aortic valve regurgitation is not visualized. Conclusion(s)/Recommendation(s): No evidence of vegetation/infective endocarditis on this transesophageael echocardiogram. FINDINGS  Left Ventricle: Left ventricular ejection fraction, by estimation, is 70 to 75%. The left ventricle has hyperdynamic function. The left ventricular internal cavity size was normal in size. There is mild left ventricular hypertrophy. Right Ventricle: The right ventricular size is normal. No increase in right ventricular wall thickness. Right ventricular systolic function is normal. Left Atrium: Left atrial size was mildly dilated. No left atrial/left atrial appendage thrombus was detected. Right Atrium: Right atrial size was mildly dilated. Pericardium:  Trivial pericardial effusion is present. The pericardial effusion is circumferential. Mitral Valve: The mitral valve is grossly normal. Mild mitral valve regurgitation. Tricuspid Valve: The tricuspid valve is grossly normal. Tricuspid valve regurgitation is mild. Aortic Valve: The aortic valve is tricuspid. Aortic valve regurgitation is not visualized. Pulmonic Valve: The pulmonic valve was grossly normal. Pulmonic valve regurgitation is trivial. Aorta: The aortic root and ascending aorta are structurally normal, with no evidence of dilitation. IAS/Shunts: No atrial level shunt detected by color flow Doppler. Additional Comments: Spectral Doppler performed. LEFT VENTRICLE PLAX 2D LVOT diam:     2.10 cm LVOT Area:     3.46 cm   AORTA Ao Asc diam: 3.60 cm  SHUNTS Systemic Diam: 2.10 cm Dinah Franco MD Electronically signed by Dinah Franco MD Signature Date/Time: 01/09/2024/2:15:29 PM    Final    EP STUDY Result Date: 01/09/2024 See surgical note for result.     LOS: 7 days    Aneita Keens, MD [facility] 01/10/2024, 8:09 AM   If 7PM-7AM,  please contact night-coverage [email or web address]

## 2024-01-10 NOTE — Progress Notes (Signed)
 Pt NPO after 12 MN , vs wdl , mouth wash done , CHG WIBES WHOLE BODY , TELE BOX stand bye according subrina sundill md , pre op preparation done , no pain , sugar done 75 mg/dl CRNA informed and he got report .

## 2024-01-10 NOTE — Op Note (Signed)
 01/10/2024  10:16 AM  PATIENT:  Carol [name]  63 y.o. female  Patient Care Team: Pa, [facility] as PCP - General (Internal Medicine) Bridgette Campus, MD as PCP - Cardiology (Cardiology)  PRE-OPERATIVE DIAGNOSIS:  Cholecystitis  POST-OPERATIVE DIAGNOSIS:  Same  PROCEDURE:  Laparoscopic converted to open cholecystectomy  SURGEON:  Cannon Champion, MD  ASSISTANT: None  ANESTHESIA:   general  COUNTS:  Sponge, needle and instrument counts were reported correct x2 at the conclusion of the operation.  EBL:  DRAINS: None  SPECIMEN: Gallbladder  COMPLICATIONS: Veress needle entered the liver and resulted in significant blood loss.  Case converted to open and hemostasis achieved.  FINDINGS: Cirrhotic liver  DISPOSITION: PACU in satisfactory condition  INDICATION: Carol [name] is a 63 year old female who presented to the hospital with signs of sepsis. Work up revealed fungemia as well as signs of cholecystitis and newly diagnosed cirrhosis. She had a TEE that did not reveal a vegetation. She continued to have right upper quadrant pain, and I offered her a cholecystectomy. I addressed all of her questions and written consent was obtained.   DESCRIPTION: The patient was identified in preop holding and taken to the OR where she was placed on the operating room table. SCDs were placed. General endotracheal anesthesia was induced without difficulty. The abdomen was then prepped and draped in the usual sterile fashion. A surgical timeout was performed indicating the correct patient, procedure, positioning and need for preoperative antibiotics.   I began my abdominal access with a veress needle at Palmer's point in the LUQ. Following a positive saline drop test and aspiration of air, the insufflation tubing was connected. The monitor read that there was an occlusion. I removed the veress needle and attempted a second time.  I again aspirated air and had a positive saline drop  test prior to connecting the insufflation tubing. Her abdomen was brought to and there was expansion of the abdominal wall. I placed a 5mm trocar to the right of the umbilicus using an optiview technique. I disconnected the insufflation tubing from the veress and moved it to the port. Blood immediately began pouring from the veress needle. I notified anesthesia of this and told them that I was concerned for a vascular injury.  I stuck a laparoscope into the abdomen and could see a pooling of blood in the LUQ as well as active extravasation from the liver. I immediately made the decision to convert to open and again notified anesthesia.  As they worked to obtain more IV access, I made a midline laparotomy incision with the #11 blade. This was carried down through the subcutaneous tissue using electrocautery. The linea was scored and the abdomen entered. I examined the LUQ and found a large clot without evidence of active extravasation. I examined the liver and noted that it was cirrhotic. There was a small defect on the left lobe of the liver, consistent with a veress needle injury. Anesthesia notified me that the patient had been transiently hypotensive but was now hypertensive.  Given that there were no signs of active bleeding and that the patient was stable, I decided to proceed with open cholecystectomy. I brought a self-retaining retractor to the field and placed the patient in reverse trendelenburg to aid in visualization. The liver was retracted up and the dome of the gallbladder was retracted laterally. I then performed a dome-down cholecystectomy in standard fashion, using electrocautery to free the gallbladder from the fossa. The common  bile duct was identified and appeared normal. The cystic duct and artery were identified. The artery was clipped and then cut. The cystic duct was ligated with 2-0 silk and a clip and then cut. The specimen was passed off the field. There fossa was evaluated. There  was venous bleeding along the lateral edge of the fossa that was fairly significant. I was able to ligate the vein with a 3-0 silk. TI then irrigated the abdomen with several liters of saline until the effluent ran clear. he remainder of the fossa appeared raw but hemostatic. I placed nu-knit in the fossa as well as over the area of the veress needle injury. The fascia was closed with running #1 PDS and the skin approximated with staples. The port site was also closed with staples.  At the conclusion of the case she was normotensive and was able to be extubated by anesthesia.

## 2024-01-10 NOTE — TOC Progression Note (Signed)
 Transition of Care [facility]) - Progression Note    Patient Details  Name: Carol [name] MRN: [id number] Date of Birth: December 05, 1960  [facility]) CM/SW Contact  Patt Steinhardt A [place], LCSW Phone Number: 01/10/2024, 4:32 PM  Clinical Narrative:     CSW was contacted by pt's nurse complex care manager with [employer] [place], through pt's direct medicaid. CSW provided contact # for RNCM [place]. Stated she would reach back out for updates early next week.   TOC will continue to follow.        Expected Discharge Plan and Services                                               Social Determinants of Health (SDOH) Interventions SDOH Screenings   Food Insecurity: No Food Insecurity (01/03/2024)  Housing: Low Risk  (01/03/2024)  Transportation Needs: No Transportation Needs (01/03/2024)  Utilities: Not At Risk (01/03/2024)  Financial Resource Strain: Low Risk  (04/04/2022)  Tobacco Use: High Risk (01/03/2024)    Readmission Risk Interventions    08/02/2023    5:29 PM 03/25/2023    4:51 PM 03/27/2022   10:36 AM  Readmission Risk Prevention Plan  Transportation Screening Complete Complete Complete  Medication Review [facility]) Complete Complete Complete  PCP or Specialist appointment within 3-5 days of discharge   Complete  HRI or Home Care Consult Complete Complete Complete  SW Recovery Care/Counseling Consult   Complete  Palliative Care Screening Not Applicable Not Applicable Not Applicable  Skilled Nursing Facility Not Applicable Not Applicable Not Applicable

## 2024-01-10 NOTE — Progress Notes (Signed)
 Subjective/Chief Complaint: Denies new complaints   Objective: Vital signs in last 24 hours: Temp:  [97.7 F (36.5 C)-98.3 F (36.8 C)] 98.1 F (36.7 C) (05/23 0712) Pulse Rate:  [62-74] 68 (05/23 0712) Resp:  [11-23] 18 (05/23 0712) BP: (144-176)/(69-90) 167/77 (05/23 0712) SpO2:  [93 %-100 %] 96 % (05/23 0712) Weight:  [67.1 kg] 67.1 kg (05/23 0712) Last BM Date : 01/05/24  Intake/Output from previous day: No intake/output data recorded. Intake/Output this shift: No intake/output data recorded.  Exam: Awake and alert Comfortable Abdomen soft, tenderness with guarding in the RUQ  Lab Results:  Recent Labs    01/08/24 0614 01/09/24 0739  WBC 6.4 5.5  HGB 8.9* 9.1*  HCT 27.3* 27.6*  PLT 124* 136*   BMET Recent Labs    01/08/24 0614 01/09/24 0739  NA 131* 134*  K 3.9 4.0  CL 98 100  CO2 21* 22  GLUCOSE 72 71  BUN 41* 22  CREATININE 6.70* 4.41*  CALCIUM  8.3* 8.7*   PT/INR Recent Labs    01/09/24 0648  LABPROT 14.4  INR 1.1   ABG No results for input(s): "PHART", "HCO3" in the last 72 hours.  Invalid input(s): "PCO2", "PO2"  Studies/Results: ECHO TEE Result Date: 01/09/2024    TRANSESOPHOGEAL ECHO REPORT   Patient Name:   Carol [name] Date of Exam: 01/09/2024 Medical Rec #:  [id number]               Height:       64.0 in Accession #:    [id number]              Weight:       168.7 lb Date of Birth:  1961-04-23               BSA:          1.820 m Patient Age:    63 years                BP:           170/81 mmHg Patient Gender: F                       HR:           75 bpm. Exam Location:  Inpatient Procedure: Transesophageal Echo, Cardiac Doppler and Color Doppler (Both            Spectral and Color Flow Doppler were utilized during procedure). Indications:     endocarditis  History:         Patient has prior history of Echocardiogram examinations, most                  recent 01/05/2024. CHF, CAD, COPD and End stage renal disease;                   Risk Factors:Hypertension and Diabetes.  Sonographer:     Kip Peon RDCS Referring Phys:  [id number] Dellis Fermo Diagnosing Phys: Dinah Franco MD PROCEDURE: After discussion of the risks and benefits of a TEE, an informed consent was obtained from the patient. The transesophogeal probe was passed without difficulty through the esophogus of the patient. Imaged were obtained with the patient in a left lateral decubitus position. Sedation performed by different physician. The patient was monitored while under deep sedation. Anesthestetic sedation was provided intravenously by Anesthesiology: 170mg  of Propofol . The patient developed no complications during the procedure.  IMPRESSIONS  1. Left ventricular  ejection fraction, by estimation, is 70 to 75%. The left ventricle has hyperdynamic function. There is mild left ventricular hypertrophy.  2. Right ventricular systolic function is normal. The right ventricular size is normal.  3. Left atrial size was mildly dilated. No left atrial/left atrial appendage thrombus was detected.  4. Right atrial size was mildly dilated.  5. The mitral valve is grossly normal. Mild mitral valve regurgitation.  6. The aortic valve is tricuspid. Aortic valve regurgitation is not visualized. Conclusion(s)/Recommendation(s): No evidence of vegetation/infective endocarditis on this transesophageael echocardiogram. FINDINGS  Left Ventricle: Left ventricular ejection fraction, by estimation, is 70 to 75%. The left ventricle has hyperdynamic function. The left ventricular internal cavity size was normal in size. There is mild left ventricular hypertrophy. Right Ventricle: The right ventricular size is normal. No increase in right ventricular wall thickness. Right ventricular systolic function is normal. Left Atrium: Left atrial size was mildly dilated. No left atrial/left atrial appendage thrombus was detected. Right Atrium: Right atrial size was mildly dilated. Pericardium: Trivial pericardial  effusion is present. The pericardial effusion is circumferential. Mitral Valve: The mitral valve is grossly normal. Mild mitral valve regurgitation. Tricuspid Valve: The tricuspid valve is grossly normal. Tricuspid valve regurgitation is mild. Aortic Valve: The aortic valve is tricuspid. Aortic valve regurgitation is not visualized. Pulmonic Valve: The pulmonic valve was grossly normal. Pulmonic valve regurgitation is trivial. Aorta: The aortic root and ascending aorta are structurally normal, with no evidence of dilitation. IAS/Shunts: No atrial level shunt detected by color flow Doppler. Additional Comments: Spectral Doppler performed. LEFT VENTRICLE PLAX 2D LVOT diam:     2.10 cm LVOT Area:     3.46 cm   AORTA Ao Asc diam: 3.60 cm  SHUNTS Systemic Diam: 2.10 cm Dinah Franco MD Electronically signed by Dinah Franco MD Signature Date/Time: 01/09/2024/2:15:29 PM    Final    EP STUDY Result Date: 01/09/2024 See surgical note for result.  IR Fluoro Guide CV Line Right Result Date: 01/08/2024 INDICATION: 63 year old female with history of end-stage renal disease on hemodialysis presenting for replacement of tunneled hemodialysis catheter after line holiday. EXAM: TUNNELED CENTRAL VENOUS HEMODIALYSIS CATHETER PLACEMENT WITH ULTRASOUND AND FLUOROSCOPIC GUIDANCE MEDICATIONS: Vancomycin 1 gm IV . The antibiotic was given in an appropriate time interval prior to skin puncture. ANESTHESIA/SEDATION: Moderate (conscious) sedation was employed during this procedure. A total of Versed  1.5 mg and Fentanyl  50 mcg was administered intravenously. Moderate Sedation Time: 11 minutes. The patient's level of consciousness and vital signs were monitored continuously by radiology nursing throughout the procedure under my direct supervision. FLUOROSCOPY TIME:  Nine mGy reference air kerma COMPLICATIONS: None immediate. PROCEDURE: Informed written consent was obtained from the patient after a discussion of the risks, benefits, and  alternatives to treatment. Questions regarding the procedure were encouraged and answered. The right neck and chest were prepped with chlorhexidine  in a sterile fashion, and a sterile drape was applied covering the operative field. Maximum barrier sterile technique with sterile gowns and gloves were used for the procedure. A timeout was performed prior to the initiation of the procedure. After creating a small venotomy incision, a 21 gauge micropuncture kit was utilized to access the internal jugular vein. Real-time ultrasound guidance was utilized for vascular access including the acquisition of a permanent ultrasound image documenting patency of the accessed vessel. A Rosen wire was advanced to the level of the IVC and the micropuncture sheath was exchanged for an 8 Fr dilator. A 14.5 French tunneled hemodialysis catheter measuring 23  cm from tip to cuff was tunneled in a retrograde fashion from the anterior chest wall to the venotomy incision. Serial dilation was then performed an a peel-away sheath was placed. The catheter was then placed through the peel-away sheath with the catheter tip ultimately positioned within the right atrium. Final catheter positioning was confirmed and documented with a spot radiographic image. The catheter aspirates and flushes normally. The catheter was flushed with appropriate volume heparin  dwells. The catheter exit site was secured with a 0-Silk retention suture. The venotomy incision was closed with Dermabond. Sterile dressings were applied. The patient tolerated the procedure well without immediate post procedural complication. IMPRESSION: Successful placement of 23 cm tip to cuff tunneled hemodialysis catheter via the right internal jugular vein with catheter tip terminating within the right atrium. The catheter is ready for immediate use. Creasie Doctor, MD [facility] Radiology Electronically Signed   By: Creasie Doctor M.D.   On:  01/08/2024 12:15   IR US  Guide Vasc Access Right Result Date: 01/08/2024 INDICATION: 63 year old female with history of end-stage renal disease on hemodialysis presenting for replacement of tunneled hemodialysis catheter after line holiday. EXAM: TUNNELED CENTRAL VENOUS HEMODIALYSIS CATHETER PLACEMENT WITH ULTRASOUND AND FLUOROSCOPIC GUIDANCE MEDICATIONS: Vancomycin 1 gm IV . The antibiotic was given in an appropriate time interval prior to skin puncture. ANESTHESIA/SEDATION: Moderate (conscious) sedation was employed during this procedure. A total of Versed  1.5 mg and Fentanyl  50 mcg was administered intravenously. Moderate Sedation Time: 11 minutes. The patient's level of consciousness and vital signs were monitored continuously by radiology nursing throughout the procedure under my direct supervision. FLUOROSCOPY TIME:  Nine mGy reference air kerma COMPLICATIONS: None immediate. PROCEDURE: Informed written consent was obtained from the patient after a discussion of the risks, benefits, and alternatives to treatment. Questions regarding the procedure were encouraged and answered. The right neck and chest were prepped with chlorhexidine  in a sterile fashion, and a sterile drape was applied covering the operative field. Maximum barrier sterile technique with sterile gowns and gloves were used for the procedure. A timeout was performed prior to the initiation of the procedure. After creating a small venotomy incision, a 21 gauge micropuncture kit was utilized to access the internal jugular vein. Real-time ultrasound guidance was utilized for vascular access including the acquisition of a permanent ultrasound image documenting patency of the accessed vessel. A Rosen wire was advanced to the level of the IVC and the micropuncture sheath was exchanged for an 8 Fr dilator. A 14.5 French tunneled hemodialysis catheter measuring 23 cm from tip to cuff was tunneled in a retrograde fashion from the anterior chest wall to  the venotomy incision. Serial dilation was then performed an a peel-away sheath was placed. The catheter was then placed through the peel-away sheath with the catheter tip ultimately positioned within the right atrium. Final catheter positioning was confirmed and documented with a spot radiographic image. The catheter aspirates and flushes normally. The catheter was flushed with appropriate volume heparin  dwells. The catheter exit site was secured with a 0-Silk retention suture. The venotomy incision was closed with Dermabond. Sterile dressings were applied. The patient tolerated the procedure well without immediate post procedural complication. IMPRESSION: Successful placement of 23 cm tip to cuff tunneled hemodialysis catheter via the right internal jugular vein with catheter tip terminating within the right atrium. The catheter is ready for immediate use. Creasie Doctor, MD [facility] Radiology Electronically Signed   By: Jolaine Nasuti.D.  On: 01/08/2024 12:15    Anti-infectives: Anti-infectives (From admission, onward)    Start     Dose/Rate Route Frequency Ordered Stop   01/08/24 0810  vancomycin (VANCOCIN) IVPB 1000 mg/200 mL premix        over 60 Minutes Intravenous Continuous PRN 01/08/24 0810 01/08/24 0843   01/08/24 0000  vancomycin (VANCOCIN) IVPB 1000 mg/200 mL premix        1,000 mg 200 mL/hr over 60 Minutes Intravenous  Once 01/07/24 0922 01/08/24 0024   01/07/24 1015  [MAR Hold]  metroNIDAZOLE  (FLAGYL ) tablet 500 mg        (MAR Hold since Fri 01/10/2024 at 0707.Hold Reason: Transfer to a Procedural area)   500 mg Oral Every 12 hours 01/07/24 0922     01/05/24 1430  metroNIDAZOLE  (FLAGYL ) IVPB 500 mg  Status:  Discontinued        500 mg 100 mL/hr over 60 Minutes Intravenous Every 12 hours 01/05/24 1330 01/07/24 0922   01/04/24 2030  [MAR Hold]  micafungin  (MYCAMINE ) 100 mg in sodium chloride  0.9 % 100 mL IVPB        (MAR Hold since Fri  01/10/2024 at 0707.Hold Reason: Transfer to a Procedural area)   100 mg 105 mL/hr over 1 Hours Intravenous Every 24 hours 01/04/24 1940     01/04/24 1000  [MAR Hold]  cefTRIAXone  (ROCEPHIN ) 2 g in sodium chloride  0.9 % 100 mL IVPB        (MAR Hold since Fri 01/10/2024 at 0707.Hold Reason: Transfer to a Procedural area)   2 g 200 mL/hr over 30 Minutes Intravenous Every 24 hours 01/03/24 2055     01/03/24 1615  cefTRIAXone  (ROCEPHIN ) 1 g in sodium chloride  0.9 % 100 mL IVPB        1 g 200 mL/hr over 30 Minutes Intravenous  Once 01/03/24 1601 01/03/24 1759   01/03/24 1615  azithromycin  (ZITHROMAX ) 500 mg in sodium chloride  0.9 % 250 mL IVPB        500 mg 250 mL/hr over 60 Minutes Intravenous  Once 01/03/24 1601 01/03/24 1915       Assessment/Plan: Cholelithiasis 63 y/o F admitted with fever, candida bacteremia with imaging studies concerning for inflammatory changes around the gallbladder.  - Will proceed to the OR. We discussed the alternatives and potential risks of surgery, including but not limited to: bleeding, infection, damage to bowel or surrounding structures, bile leak, damage to the biliary system, pancreatitis, retained stone, need for subtotal cholecystectomy, and need for additional procedures. All questions were addressed and consent was obtained.     Cannon Champion 01/10/2024

## 2024-01-10 NOTE — Plan of Care (Signed)

## 2024-01-10 NOTE — Anesthesia Procedure Notes (Signed)
 Procedure Name: Intubation Date/Time: 01/10/2024 7:56 AM  Performed by: Candance Certain, CRNAPre-anesthesia Checklist: Patient identified, Emergency Drugs available, Suction available and Patient being monitored Patient Re-evaluated:Patient Re-evaluated prior to induction Oxygen Delivery Method: Circle System Utilized Preoxygenation: Pre-oxygenation with 100% oxygen Induction Type: IV induction Laryngoscope Size: Mac and 4 Grade View: Grade I Tube type: Oral Tube size: 7.0 mm Number of attempts: 1 Airway Equipment and Method: Stylet and Oral airway Placement Confirmation: ETT inserted through vocal cords under direct vision, positive ETCO2 and breath sounds checked- equal and bilateral Secured at: 21 cm Tube secured with: Tape Dental Injury: Teeth and Oropharynx as per pre-operative assessment  Comments: Atraumatic induction/intubation. Modified RSI with no mask. Grade 1 view of glottis. Dentition and oral mucosa as per preop.

## 2024-01-10 NOTE — Progress Notes (Signed)
 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 Patient in OR for lap chole this morning. Discussed with Dr. Shereen Dike and arranged for 1 dose of meropenem to be given in the OR to provide treatment/prophylaxis of surgery.   Denson Flake, PharmD, BCPS, BCIDP Infectious Diseases Clinical Pharmacist Phone: [phone] 01/10/2024 9:46 AM

## 2024-01-10 NOTE — Anesthesia Postprocedure Evaluation (Signed)
 Anesthesia Post Note  Patient: Carol [name]  Procedure(s) Performed: LAPAROSCOPIC CHOLECYSTECTOMY, ATTEMPTED (Abdomen) INDOCYANINE GREEN FLUORESCENCE IMAGING (ICG) (Abdomen) OPEN CHOLECYSTECTOMY (Abdomen)     Patient location during evaluation: PACU Anesthesia Type: General Level of consciousness: awake and alert Pain management: pain level controlled Vital Signs Assessment: post-procedure vital signs reviewed and stable Respiratory status: spontaneous breathing, nonlabored ventilation, respiratory function stable and patient connected to nasal cannula oxygen Cardiovascular status: blood pressure returned to baseline and stable Postop Assessment: no apparent nausea or vomiting Anesthetic complications: no   There were no known notable events for this encounter.  Last Vitals:  Vitals:   01/10/24 1113 01/10/24 1123  BP:  (!) 149/63  Pulse: 76 78  Resp: 19   Temp: (!) 36.4 C   SpO2: 95% 100%    Last Pain:  Vitals:   01/10/24 1025  TempSrc:   PainSc: 0-No pain                 Kahealani Yankovich

## 2024-01-10 NOTE — Transfer of Care (Signed)
 Immediate Anesthesia Transfer of Care Note  Patient: Enijah Furr  Procedure(s) Performed: LAPAROSCOPIC CHOLECYSTECTOMY, ATTEMPTED (Abdomen) INDOCYANINE GREEN FLUORESCENCE IMAGING (ICG) (Abdomen) OPEN CHOLECYSTECTOMY (Abdomen)  Patient Location: PACU  Anesthesia Type:General  Level of Consciousness: awake and patient cooperative  Airway & Oxygen Therapy: Patient Spontanous Breathing and Patient connected to face mask oxygen  Post-op Assessment: Report given to RN and Post -op Vital signs reviewed and stable  Post vital signs: Reviewed and stable  Last Vitals:  Vitals Value Taken Time  BP 158/67 01/10/24 1030  Temp    Pulse 80 01/10/24 1031  Resp 18 01/10/24 1031  SpO2 97 % 01/10/24 1031  Vitals shown include unfiled device data.  Last Pain:  Vitals:   01/10/24 7829  TempSrc: Oral  PainSc:       Patients Stated Pain Goal: 0 (01/04/24 1646)  Complications: No notable events documented.

## 2024-01-10 NOTE — Progress Notes (Addendum)
 [facility] Progress Note   Subjective:    Seen and examined patient at bedside. S/p lap chole by Dr. Davonna Estes today. She reports feeling ok and denies SOB, CP, and N/V. Plan for HD tonight.   Objective Vitals:   01/10/24 1045 01/10/24 1100 01/10/24 1113 01/10/24 1123  BP: (!) 173/71 (!) 149/59  (!) 149/63  Pulse: 80 75 76 78  Resp: 18 (!) 22 19   Temp:   (!) 97.5 F (36.4 C)   TempSrc:      SpO2: 98% 91% 95% 100%  Weight:      Height:       Physical Exam General: Alert female in NAD Heart: RRR, no murmrus, rubs or gallops Lungs: CTA bilaterally, respirations unlabored, on RA Abdomen: Soft, non-distended, +BS  Extremities: No edema b/l lower extremities Dialysis Access:  TDC removed 01/06/24, AVF + t/b but somewhat small, needs 2nd stage surgery for AVF. S/p [facility]  01/08/24.   Filed Weights   01/08/24 1818 01/09/24 0500 01/10/24 0712  Weight: 76.3 kg 76.5 kg 67.1 kg    Intake/Output Summary (Last 24 hours) at 01/10/2024 1655 Last data filed at 01/10/2024 1022 Gross per 24 hour  Intake 550 ml  Output 300 ml  Net 250 ml    Additional Objective Labs: Basic Metabolic Panel: Recent Labs  Lab 01/08/24 0614 01/09/24 0739 01/10/24 0724 01/10/24 1458 01/10/24 1459  NA 131* 134* 137 133* 135  K 3.9 4.0 3.6 3.9 3.9  CL 98 100 104 97* 97*  CO2 21* 22  --  20* 20*  GLUCOSE 72 71 75 161* 163*  BUN 41* 22 30* 32* 31*  CREATININE 6.70* 4.41* 4.90* 5.87* 6.03*  CALCIUM  8.3* 8.7*  --  8.8* 9.0  PHOS 5.5* 3.6  --  4.5  --    Liver Function Tests: Recent Labs  Lab 01/04/24 0529 01/05/24 1427 01/09/24 0739 01/10/24 1458 01/10/24 1459  AST 112* 38  --   --  51*  ALT 44 29  --   --  14  ALKPHOS 124 128*  --   --  119  BILITOT 1.0 0.6  --   --  0.9  PROT 6.3* 6.9  --   --  7.1  ALBUMIN  2.5* 2.8* 2.5* 2.8* 2.7*   No results for input(s): "LIPASE", "AMYLASE" in the last 168 hours. CBC: Recent Labs  Lab 01/04/24 0702 01/06/24 0532 01/07/24 0705  01/08/24 0614 01/09/24 0739 01/10/24 0724  WBC 15.0* 8.2 7.6 6.4 5.5  --   HGB 9.0* 9.2* 8.5* 8.9* 9.1* 9.9*  HCT 27.9* 28.3* 25.7* 27.3* 27.6* 29.0*  MCV 92.1 89.0 88.9 89.2 88.7  --   PLT 110* 134* 119* 124* 136*  --    Blood Culture    Component Value Date/Time   SDES BLOOD SITE NOT SPECIFIED 01/09/2024 1427   SDES BLOOD SITE NOT SPECIFIED 01/09/2024 1427   SPECREQUEST  01/09/2024 1427    BOTTLES DRAWN AEROBIC AND ANAEROBIC Blood Culture adequate volume   SPECREQUEST  01/09/2024 1427    BOTTLES DRAWN AEROBIC AND ANAEROBIC Blood Culture adequate volume   CULT  01/09/2024 1427    NO GROWTH < 24 HOURS Performed at [facility], [address] [place], [place] [address]    CULT  01/09/2024 1427    NO GROWTH < 24 HOURS Performed at [facility], [address] [place], [place] [address]    REPTSTATUS PENDING 01/09/2024 1427   REPTSTATUS PENDING 01/09/2024  1427    Cardiac Enzymes: No results for input(s): "CKTOTAL", "CKMB", "CKMBINDEX", "TROPONINI" in the last 168 hours. CBG: Recent Labs  Lab 01/04/24 1747 01/04/24 1822 01/10/24 0552 01/10/24 0717 01/10/24 1032  GLUCAP 65* 76 75 81 136*   Iron  Studies: No results for input(s): "IRON ", "TIBC", "TRANSFERRIN", "FERRITIN" in the last 72 hours. Lab Results  Component Value Date   INR 1.1 01/09/2024   INR 1.0 08/11/2023   INR >10.0 (HH) 08/11/2023   Studies/Results: ECHO TEE Result Date: 01/09/2024    TRANSESOPHOGEAL ECHO REPORT   Patient Name:   GIOVANNI BATH Date of Exam: 01/09/2024 Medical Rec #:  [id number]               Height:       64.0 in Accession #:    [id number]              Weight:       168.7 lb Date of Birth:  13-Nov-1960               BSA:          1.820 m Patient Age:    63 years                BP:           170/81 mmHg Patient Gender: F                       HR:           75 bpm. Exam Location:  Inpatient Procedure: Transesophageal Echo, Cardiac Doppler and Color Doppler (Both             Spectral and Color Flow Doppler were utilized during procedure). Indications:     endocarditis  History:         Patient has prior history of Echocardiogram examinations, most                  recent 01/05/2024. CHF, CAD, COPD and End stage renal disease;                  Risk Factors:Hypertension and Diabetes.  Sonographer:     Kip Peon RDCS Referring Phys:  [id number] Dellis Fermo Diagnosing Phys: Dinah Franco MD PROCEDURE: After discussion of the risks and benefits of a TEE, an informed consent was obtained from the patient. The transesophogeal probe was passed without difficulty through the esophogus of the patient. Imaged were obtained with the patient in a left lateral decubitus position. Sedation performed by different physician. The patient was monitored while under deep sedation. Anesthestetic sedation was provided intravenously by Anesthesiology: 170mg  of Propofol . The patient developed no complications during the procedure.  IMPRESSIONS  1. Left ventricular ejection fraction, by estimation, is 70 to 75%. The left ventricle has hyperdynamic function. There is mild left ventricular hypertrophy.  2. Right ventricular systolic function is normal. The right ventricular size is normal.  3. Left atrial size was mildly dilated. No left atrial/left atrial appendage thrombus was detected.  4. Right atrial size was mildly dilated.  5. The mitral valve is grossly normal. Mild mitral valve regurgitation.  6. The aortic valve is tricuspid. Aortic valve regurgitation is not visualized. Conclusion(s)/Recommendation(s): No evidence of vegetation/infective endocarditis on this transesophageael echocardiogram. FINDINGS  Left Ventricle: Left ventricular ejection fraction, by estimation, is 70 to 75%. The left ventricle has hyperdynamic function. The left ventricular internal cavity size was normal in size. There is mild  left ventricular hypertrophy. Right Ventricle: The right ventricular size is normal. No increase in right  ventricular wall thickness. Right ventricular systolic function is normal. Left Atrium: Left atrial size was mildly dilated. No left atrial/left atrial appendage thrombus was detected. Right Atrium: Right atrial size was mildly dilated. Pericardium: Trivial pericardial effusion is present. The pericardial effusion is circumferential. Mitral Valve: The mitral valve is grossly normal. Mild mitral valve regurgitation. Tricuspid Valve: The tricuspid valve is grossly normal. Tricuspid valve regurgitation is mild. Aortic Valve: The aortic valve is tricuspid. Aortic valve regurgitation is not visualized. Pulmonic Valve: The pulmonic valve was grossly normal. Pulmonic valve regurgitation is trivial. Aorta: The aortic root and ascending aorta are structurally normal, with no evidence of dilitation. IAS/Shunts: No atrial level shunt detected by color flow Doppler. Additional Comments: Spectral Doppler performed. LEFT VENTRICLE PLAX 2D LVOT diam:     2.10 cm LVOT Area:     3.46 cm   AORTA Ao Asc diam: 3.60 cm  SHUNTS Systemic Diam: 2.10 cm Dinah Franco MD Electronically signed by Dinah Franco MD Signature Date/Time: 01/09/2024/2:15:29 PM    Final    EP STUDY Result Date: 01/09/2024 See surgical note for result.   Medications:  anticoagulant sodium citrate      micafungin  (MYCAMINE ) 100 mg in sodium chloride  0.9 % 100 mL IVPB 100 mg (01/09/24 2246)    (feeding supplement) PROSource Plus  30 mL Oral BID BM   amLODipine   10 mg Oral Daily   aspirin  EC  81 mg Oral Daily   atorvastatin   80 mg Oral Daily   Chlorhexidine  Gluconate Cloth  6 each Topical Q0600   ciprofloxacin   500 mg Oral Q24H   [START ON 01/15/2024] darbepoetin (ARANESP ) injection - DIALYSIS  100 mcg Subcutaneous Q Wed-1800   gabapentin   100 mg Oral BID   heparin   5,000 Units Subcutaneous Q8H   losartan  25 mg Oral Daily   metroNIDAZOLE   500 mg Oral Q12H   pantoprazole   40 mg Oral QHS    Dialysis Orders: [facility] M[facility] 4h   B500   70.5kg    TDC     Heparin  2000 Has been coming off 2-4kg over last 2 wks Had 30 min Rx on 5/16   Assessment/Plan: Fever / leukopenia: blood cultures showing candidemia. ID on board. Appears line holiday was completed. S/p new [facility] 5/21. No fevers for past two days. WBC trending down on IV antibiotics.  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 On Flagyl  500 mg q 12 hours and Mycamine  100 mg q 24 hours.  ESRD: on HD MWF.  Next HD 01/10/24 HTN: ARB was added yesterday for controlled HTN, cont norvasc  and she has PRN hydralazine . UF will help with her BP.  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 ESA ordered.  Holding IV Fe with infection  Possible cholecystitis: CT of the chest/abdomen/pelvis shows atypical inflammatory changes of her lungs as well as gallstones with trace pericholecystic fluid. RUQ U/S shows stones, wall thickening, and pericholecystic fluid. S/p lap chole today. Both of this studies also show a nodular liver contour concerning for possible cirrhosis  Secondary hyperparathyroidism: CCa 9.7, phos 5.5.  Nutrition: albumin  2.8 on renal diet. Continue protein supplements.   Jadene Maxwell, NP [facility] 01/10/2024,4:55 PM  LOS: 7 days    Seen and examined independently.  Agree with note and exam as documented above by physician extender and as noted here.  General adult female  in bed in no acute distress HEENT normocephalic atraumatic extraocular movements intact sclera anicteric Neck supple trachea midline Lungs clear to auscultation bilaterally normal work of breathing at rest on room air, lying flat Heart S1S2 no rub Abdomen soft nontender nondistended Extremities no pitting edema  Psych normal mood and affect Neuro - alert and oriented x 3 provides hx and follows commands  RIJ tunn catheter; LUE AVF with bruit and thrill    Fungemia - s/p line holiday. IR placed a tunneled rather than a nontunneled catheter.   Per ID continue with the tunneled catheter and follow cultures.  Antifungals per ID.  Cultures from 5/19 are now positive with gram negative rods (positive from 5/17).  At this time ID is watching the cultures and has ordered repeat blood cultures   ESRD - HD per MWF schedule; I have requested daily weights as limited weights available but last weight quite elevated.  Increase UF gently with HD next tx.  I have ordered daily renal panel.     HTN - noted pt started on losartan. Optimize volume with HD   Anemia of CKD - We increased ESA for the 5/28 dose  Nan Aver, MD 01/10/2024  6:53 PM

## 2024-01-11 ENCOUNTER — Encounter (HOSPITAL_COMMUNITY): Payer: Self-pay | Admitting: General Surgery

## 2024-01-11 DIAGNOSIS — B377 Candidal sepsis: Secondary | ICD-10-CM | POA: Diagnosis not present

## 2024-01-11 DIAGNOSIS — R6520 Severe sepsis without septic shock: Secondary | ICD-10-CM | POA: Diagnosis not present

## 2024-01-11 LAB — CBC
HCT: 24.2 % — ABNORMAL LOW (ref 36.0–46.0)
Hemoglobin: 7.8 g/dL — ABNORMAL LOW (ref 12.0–15.0)
MCH: 28.9 pg (ref 26.0–34.0)
MCHC: 32.2 g/dL (ref 30.0–36.0)
MCV: 89.6 fL (ref 80.0–100.0)
Platelets: 175 10*3/uL (ref 150–400)
RBC: 2.7 MIL/uL — ABNORMAL LOW (ref 3.87–5.11)
RDW: 16.5 % — ABNORMAL HIGH (ref 11.5–15.5)
WBC: 6.2 10*3/uL (ref 4.0–10.5)
nRBC: 0 % (ref 0.0–0.2)

## 2024-01-11 LAB — RENAL FUNCTION PANEL
Albumin: 2.5 g/dL — ABNORMAL LOW (ref 3.5–5.0)
Anion gap: 14 (ref 5–15)
BUN: 21 mg/dL (ref 8–23)
CO2: 23 mmol/L (ref 22–32)
Calcium: 8.5 mg/dL — ABNORMAL LOW (ref 8.9–10.3)
Chloride: 98 mmol/L (ref 98–111)
Creatinine, Ser: 4.09 mg/dL — ABNORMAL HIGH (ref 0.44–1.00)
GFR, Estimated: 12 mL/min — ABNORMAL LOW (ref >60)
Glucose, Bld: 116 mg/dL — ABNORMAL HIGH (ref 70–99)
Phosphorus: 4.1 mg/dL (ref 2.5–4.6)
Potassium: 3.7 mmol/L (ref 3.5–5.1)
Sodium: 135 mmol/L (ref 135–145)

## 2024-01-11 LAB — COMPREHENSIVE METABOLIC PANEL WITH GFR
ALT: 14 U/L (ref 0–44)
AST: 36 U/L (ref 15–41)
Albumin: 2.4 g/dL — ABNORMAL LOW (ref 3.5–5.0)
Alkaline Phosphatase: 93 U/L (ref 38–126)
Anion gap: 11 (ref 5–15)
BUN: 20 mg/dL (ref 8–23)
CO2: 27 mmol/L (ref 22–32)
Calcium: 8.3 mg/dL — ABNORMAL LOW (ref 8.9–10.3)
Chloride: 96 mmol/L — ABNORMAL LOW (ref 98–111)
Creatinine, Ser: 4.33 mg/dL — ABNORMAL HIGH (ref 0.44–1.00)
GFR, Estimated: 11 mL/min — ABNORMAL LOW (ref >60)
Glucose, Bld: 158 mg/dL — ABNORMAL HIGH (ref 70–99)
Potassium: 3.6 mmol/L (ref 3.5–5.1)
Sodium: 134 mmol/L — ABNORMAL LOW (ref 135–145)
Total Bilirubin: 0.8 mg/dL (ref 0.0–1.2)
Total Protein: 6.4 g/dL — ABNORMAL LOW (ref 6.5–8.1)

## 2024-01-11 LAB — CULTURE, BLOOD (ROUTINE X 2)
Special Requests: ADEQUATE
Special Requests: ADEQUATE

## 2024-01-11 MED ORDER — OXYCODONE HCL 5 MG PO TABS
5.0000 mg | ORAL_TABLET | Freq: Four times a day (QID) | ORAL | Status: DC | PRN
  Administered 2024-01-11: 10 mg via ORAL
  Filled 2024-01-11: qty 1
  Filled 2024-01-11: qty 2

## 2024-01-11 NOTE — Progress Notes (Signed)
 Subjective/Chief Complaint: Underwent dialysis yesterday. Tolerating a diet without abdominal pain or nausea. Endorsing some incisional pain that is controlled with the current regimen.    Objective: Vital signs in last 24 hours: Temp:  [97.5 F (36.4 C)-98.3 F (36.8 C)] 97.8 F (36.6 C) (05/24 0412) Pulse Rate:  [69-80] 76 (05/24 0412) Resp:  [15-30] 19 (05/24 0412) BP: (124-174)/(58-89) 132/66 (05/24 0412) SpO2:  [91 %-100 %] 97 % (05/24 0412) Weight:  [77.3 kg] 77.3 kg (05/24 0412) Last BM Date : 01/09/24  Intake/Output from previous day: 05/23 0701 - 05/24 0700 In: 550 [I.V.:350; IV Piggyback:200] Out: 3300 [Blood:300] Intake/Output this shift: No intake/output data recorded.  Exam: Awake and alert Comfortable Abdomen soft, dressing clean and dry, staples intact, appropriately tender, non-distended.   Lab Results:  Recent Labs    01/09/24 0739 01/10/24 0724  WBC 5.5  --   HGB 9.1* 9.9*  HCT 27.6* 29.0*  PLT 136*  --    BMET Recent Labs    01/10/24 1458 01/10/24 1459  NA 133* 135  K 3.9 3.9  CL 97* 97*  CO2 20* 20*  GLUCOSE 161* 163*  BUN 32* 31*  CREATININE 5.87* 6.03*  CALCIUM  8.8* 9.0   PT/INR Recent Labs    01/09/24 0648  LABPROT 14.4  INR 1.1   ABG No results for input(s): "PHART", "HCO3" in the last 72 hours.  Invalid input(s): "PCO2", "PO2"  Studies/Results: ECHO TEE Result Date: 01/09/2024    TRANSESOPHOGEAL ECHO REPORT   Patient Name:   Carol [name] Date of Exam: 01/09/2024 Medical Rec #:  [id number]               Height:       64.0 in Accession #:    [id number]              Weight:       168.7 lb Date of Birth:  25-Jan-1961               BSA:          1.820 m Patient Age:    63 years                BP:           170/81 mmHg Patient Gender: F                       HR:           75 bpm. Exam Location:  Inpatient Procedure: Transesophageal Echo, Cardiac Doppler and Color Doppler (Both            Spectral and Color Flow Doppler  were utilized during procedure). Indications:     endocarditis  History:         Patient has prior history of Echocardiogram examinations, most                  recent 01/05/2024. CHF, CAD, COPD and End stage renal disease;                  Risk Factors:Hypertension and Diabetes.  Sonographer:     Kip Peon RDCS Referring Phys:  [id number] Dellis Fermo Diagnosing Phys: Dinah Franco MD PROCEDURE: After discussion of the risks and benefits of a TEE, an informed consent was obtained from the patient. The transesophogeal probe was passed without difficulty through the esophogus of the patient. Imaged were obtained with the patient in a left lateral decubitus position.  Sedation performed by different physician. The patient was monitored while under deep sedation. Anesthestetic sedation was provided intravenously by Anesthesiology: 170mg  of Propofol . The patient developed no complications during the procedure.  IMPRESSIONS  1. Left ventricular ejection fraction, by estimation, is 70 to 75%. The left ventricle has hyperdynamic function. There is mild left ventricular hypertrophy.  2. Right ventricular systolic function is normal. The right ventricular size is normal.  3. Left atrial size was mildly dilated. No left atrial/left atrial appendage thrombus was detected.  4. Right atrial size was mildly dilated.  5. The mitral valve is grossly normal. Mild mitral valve regurgitation.  6. The aortic valve is tricuspid. Aortic valve regurgitation is not visualized. Conclusion(s)/Recommendation(s): No evidence of vegetation/infective endocarditis on this transesophageael echocardiogram. FINDINGS  Left Ventricle: Left ventricular ejection fraction, by estimation, is 70 to 75%. The left ventricle has hyperdynamic function. The left ventricular internal cavity size was normal in size. There is mild left ventricular hypertrophy. Right Ventricle: The right ventricular size is normal. No increase in right ventricular wall thickness.  Right ventricular systolic function is normal. Left Atrium: Left atrial size was mildly dilated. No left atrial/left atrial appendage thrombus was detected. Right Atrium: Right atrial size was mildly dilated. Pericardium: Trivial pericardial effusion is present. The pericardial effusion is circumferential. Mitral Valve: The mitral valve is grossly normal. Mild mitral valve regurgitation. Tricuspid Valve: The tricuspid valve is grossly normal. Tricuspid valve regurgitation is mild. Aortic Valve: The aortic valve is tricuspid. Aortic valve regurgitation is not visualized. Pulmonic Valve: The pulmonic valve was grossly normal. Pulmonic valve regurgitation is trivial. Aorta: The aortic root and ascending aorta are structurally normal, with no evidence of dilitation. IAS/Shunts: No atrial level shunt detected by color flow Doppler. Additional Comments: Spectral Doppler performed. LEFT VENTRICLE PLAX 2D LVOT diam:     2.10 cm LVOT Area:     3.46 cm   AORTA Ao Asc diam: 3.60 cm  SHUNTS Systemic Diam: 2.10 cm Dinah Franco MD Electronically signed by Dinah Franco MD Signature Date/Time: 01/09/2024/2:15:29 PM    Final    EP STUDY Result Date: 01/09/2024 See surgical note for result.   Anti-infectives: Anti-infectives (From admission, onward)    Start     Dose/Rate Route Frequency Ordered Stop   01/10/24 2200  ciprofloxacin  (CIPRO ) tablet 500 mg        500 mg Oral Every 24 hours 01/10/24 1439     01/10/24 2200  metroNIDAZOLE  (FLAGYL ) tablet 500 mg        500 mg Oral Every 12 hours 01/10/24 1439     01/10/24 0845  meropenem (MERREM) 500 mg in sodium chloride  0.9 % 100 mL IVPB        500 mg 200 mL/hr over 30 Minutes Intravenous STAT 01/10/24 0830 01/10/24 0910   01/08/24 0810  vancomycin (VANCOCIN) IVPB 1000 mg/200 mL premix        over 60 Minutes Intravenous Continuous PRN 01/08/24 0810 01/08/24 0843   01/08/24 0000  vancomycin (VANCOCIN) IVPB 1000 mg/200 mL premix        1,000 mg 200 mL/hr over 60  Minutes Intravenous  Once 01/07/24 0922 01/08/24 0024   01/07/24 1015  metroNIDAZOLE  (FLAGYL ) tablet 500 mg  Status:  Discontinued        500 mg Oral Every 12 hours 01/07/24 0922 01/10/24 0830   01/05/24 1430  metroNIDAZOLE  (FLAGYL ) IVPB 500 mg  Status:  Discontinued        500 mg 100 mL/hr over 60  Minutes Intravenous Every 12 hours 01/05/24 1330 01/07/24 0922   01/04/24 2030  micafungin  (MYCAMINE ) 100 mg in sodium chloride  0.9 % 100 mL IVPB        100 mg 105 mL/hr over 1 Hours Intravenous Every 24 hours 01/04/24 1940     01/04/24 1000  cefTRIAXone  (ROCEPHIN ) 2 g in sodium chloride  0.9 % 100 mL IVPB  Status:  Discontinued        2 g 200 mL/hr over 30 Minutes Intravenous Every 24 hours 01/03/24 2055 01/10/24 0830   01/03/24 1615  cefTRIAXone  (ROCEPHIN ) 1 g in sodium chloride  0.9 % 100 mL IVPB        1 g 200 mL/hr over 30 Minutes Intravenous  Once 01/03/24 1601 01/03/24 1759   01/03/24 1615  azithromycin  (ZITHROMAX ) 500 mg in sodium chloride  0.9 % 250 mL IVPB        500 mg 250 mL/hr over 60 Minutes Intravenous  Once 01/03/24 1601 01/03/24 1915       Assessment/Plan: Cholelithiasis 63 y/o F admitted with fever, candida bacteremia with imaging studies concerning for inflammatory changes around the gallbladder now s/p lap converted to open chole 5/23  - Follow up CBC - Continue regular diet - Follow up pathology  - OOB and ambulate in the hall today - Staples to be removed in 4-6 weeks pending clinical course - Remainder of care per primary team   Cannon Champion 01/11/2024

## 2024-01-11 NOTE — Plan of Care (Signed)

## 2024-01-11 NOTE — Progress Notes (Addendum)
 [facility] Progress Note   Subjective:    Seen and examined patient at bedside. S/p lap chole by Dr. Davonna Estes 5/23 and received HD overnight. Noted she tolerated net UF 3L. Next HD 5/26.  Objective Vitals:   01/11/24 0008 01/11/24 0038 01/11/24 0412 01/11/24 0833  BP: (!) 124/58 (!) 141/67 132/66 (!) 108/57  Pulse: 76 79 76 69  Resp: (!) 27 20 19 20   Temp: 98.2 F (36.8 C) 97.9 F (36.6 C) 97.8 F (36.6 C) (!) 97.5 F (36.4 C)  TempSrc: Oral Oral Oral Oral  SpO2: 100% 96% 97% 90%  Weight:   77.3 kg   Height:       Physical Exam General: Alert female in NAD Heart: RRR, no murmrus, rubs or gallops Lungs: Clear anteriorly, respirations unlabored, on RA Abdomen: Soft, non-distended, +BS, mid-abd incision, staples CDI Extremities: No edema b/l lower extremities Dialysis Access:  TDC removed 01/06/24, AVF + t/b but somewhat small, needs 2nd stage surgery for AVF. S/p [facility]  01/08/24.   Filed Weights   01/09/24 0500 01/10/24 0712 01/11/24 0412  Weight: 76.5 kg 67.1 kg 77.3 kg    Intake/Output Summary (Last 24 hours) at 01/11/2024 1141 Last data filed at 01/11/2024 0008 Gross per 24 hour  Intake --  Output 3000 ml  Net -3000 ml    Additional Objective Labs: Basic Metabolic Panel: Recent Labs  Lab 01/09/24 0739 01/10/24 0724 01/10/24 1458 01/10/24 1459 01/11/24 0824 01/11/24 0925  NA 134*   < > 133* 135 135 134*  K 4.0   < > 3.9 3.9 3.7 3.6  CL 100   < > 97* 97* 98 96*  CO2 22  --  20* 20* 23 27  GLUCOSE 71   < > 161* 163* 116* 158*  BUN 22   < > 32* 31* 21 20  CREATININE 4.41*   < > 5.87* 6.03* 4.09* 4.33*  CALCIUM  8.7*  --  8.8* 9.0 8.5* 8.3*  PHOS 3.6  --  4.5  --  4.1  --    < > = values in this interval not displayed.   Liver Function Tests: Recent Labs  Lab 01/05/24 1427 01/09/24 0739 01/10/24 1459 01/11/24 0824 01/11/24 0925  AST 38  --  51*  --  36  ALT 29  --  14  --  14  ALKPHOS 128*  --  119  --  93  BILITOT 0.6  --  0.9  --   0.8  PROT 6.9  --  7.1  --  6.4*  ALBUMIN  2.8*   < > 2.7* 2.5* 2.4*   < > = values in this interval not displayed.   No results for input(s): "LIPASE", "AMYLASE" in the last 168 hours. CBC: Recent Labs  Lab 01/06/24 0532 01/07/24 0705 01/08/24 0614 01/09/24 0739 01/10/24 0724 01/11/24 0925  WBC 8.2 7.6 6.4 5.5  --  6.2  HGB 9.2* 8.5* 8.9* 9.1* 9.9* 7.8*  HCT 28.3* 25.7* 27.3* 27.6* 29.0* 24.2*  MCV 89.0 88.9 89.2 88.7  --  89.6  PLT 134* 119* 124* 136*  --  175   Blood Culture    Component Value Date/Time   SDES BLOOD SITE NOT SPECIFIED 01/09/2024 1427   SDES BLOOD SITE NOT SPECIFIED 01/09/2024 1427   SPECREQUEST  01/09/2024 1427    BOTTLES DRAWN AEROBIC AND ANAEROBIC Blood Culture adequate volume   SPECREQUEST  01/09/2024 1427    BOTTLES DRAWN AEROBIC AND ANAEROBIC Blood Culture adequate volume  CULT  01/09/2024 1427    NO GROWTH 2 DAYS Performed at [facility], [address], [place], [place] [address]    CULT  01/09/2024 1427    NO GROWTH 2 DAYS Performed at [facility], [address], [place], [place] [address]    REPTSTATUS PENDING 01/09/2024 1427   REPTSTATUS PENDING 01/09/2024 1427    Cardiac Enzymes: No results for input(s): "CKTOTAL", "CKMB", "CKMBINDEX", "TROPONINI" in the last 168 hours. CBG: Recent Labs  Lab 01/04/24 1747 01/04/24 1822 01/10/24 0552 01/10/24 0717 01/10/24 1032  GLUCAP 65* 76 75 81 136*   Iron  Studies: No results for input(s): "IRON ", "TIBC", "TRANSFERRIN", "FERRITIN" in the last 72 hours. Lab Results  Component Value Date   INR 1.1 01/09/2024   INR 1.0 08/11/2023   INR >10.0 (HH) 08/11/2023   Studies/Results: No results found.  Medications:  micafungin  (MYCAMINE ) 100 mg in sodium chloride  0.9 % 100 mL IVPB 100 mg (01/11/24 0106)    (feeding supplement) PROSource Plus  30 mL Oral BID BM   amLODipine   10 mg Oral Daily   aspirin  EC  81 mg Oral Daily   atorvastatin   80 mg Oral Daily   Chlorhexidine   Gluconate Cloth  6 each Topical Q0600   ciprofloxacin   500 mg Oral Q24H   [START ON 01/15/2024] darbepoetin (ARANESP ) injection - DIALYSIS  100 mcg Subcutaneous Q Wed-1800   gabapentin   100 mg Oral BID   heparin   5,000 Units Subcutaneous Q8H   losartan  25 mg Oral Daily   metroNIDAZOLE   500 mg Oral Q12H   pantoprazole   40 mg Oral QHS    Dialysis Orders: [facility] M[facility] 4h   B500   70.5kg    TDC    Heparin  2000 Has been coming off 2-4kg over last 2 wks Had 30 min Rx on 5/16   Assessment/Plan: Fever / leukopenia: blood cultures showing candidemia. ID on board. Appears line holiday was completed. S/p new [facility] 5/21. No fevers for past two days. WBC trending down on IV antibiotics. Noted repeat blood cultures from 5/19 (+) gram negative rods. ABXs were adjusted. 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 On Flagyl  500 mg q 12 hours and Mycamine  100 mg q 24 hours.  ESRD: on HD MWF.  Next HD 01/13/24 HTN: ARB was added yesterday for controlled HTN, cont norvasc  and she has PRN hydralazine . UF will help with her BP.  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 Noted dose was already raised-noext dose on 5/28.  Holding IV Fe with infection  Possible cholecystitis: CT of the chest/abdomen/pelvis shows atypical inflammatory changes of her lungs as well as gallstones with trace pericholecystic fluid. RUQ U/S shows stones, wall thickening, and pericholecystic fluid. S/p lap chole 5/23. Both of this studies also show a nodular liver contour concerning for possible cirrhosis  Secondary hyperparathyroidism: CCa 9.7, phos 5.5.  Nutrition: albumin  2.8 on renal diet. Continue protein supplements.   Carol Maxwell, NP [facility] 01/11/2024,11:41 AM  LOS: 8 days      Seen and examined independently.  Agree with note and exam as documented above by physician extender and as noted here.  General adult female in bed in no acute  distress HEENT normocephalic atraumatic extraocular movements intact sclera anicteric Neck supple trachea midline Lungs clear to auscultation bilaterally normal work of breathing at rest on room air, lying flat Heart S1S2 no rub Abdomen soft nontender nondistended Extremities  no pitting edema  Psych normal mood and affect Neuro - alert and oriented x 3 provides hx and follows commands  RIJ tunn catheter; LUE AVF with bruit and thrill    Fungemia - s/p line holiday. IR placed a tunneled rather than a nontunneled catheter.  Per ID continue with the tunneled catheter and follow cultures.  Antifungals per ID.  Cultures from 5/19 are positive with gram negative rods (positive from 5/17).  Blood cultures from 5/22 - NGTD   ESRD - HD per MWF schedule; I have requested daily weights as limited weights available    HTN - noted pt started on losartan this admit - watch for the need to stop. Optimize volume with HD   Anemia of CKD - We increased ESA for the 5/28 dose   Nan Aver, MD 01/11/2024  3:28 PM

## 2024-01-11 NOTE — Progress Notes (Signed)
 PROGRESS NOTE    Carol [name]  ZOX:[id number] DOB: 1961-04-06 DOA: 01/03/2024 PCP: [facility]   Brief Narrative: Carol [name] is a 63 y.o. female with a history of ESRD on hemodialysis, hyperlipidemia, GERD, pulm hypertension.  Patient presented secondary to upper chest pain and shortness of breath in addition to fever and chills and found to have evidence of sepsis secondary to candidemia.  Infectious ease consulted.  Hospitalization also complicated by evidence of cholelithiasis s/p laparoscopic, converted to open, cholecystectomy on 5/23.  Repeat blood cultures (5/19) growing GNRs. Antibiotics regimen adjusted per ID recommendations.   Assessment and Plan:  Sepsis Present on admission secondary to candidia tropicalis candidemia.  Fungemia In setting of HD cath. Noted on blood cultures (5/16 and 5/17). Cultures positive for candida tropicalis. ID consulted during this admission for management. Patient managed on Micafungin . Prior to admission [facility] removed on 5/19 and new TDC placed on 5/21.  Transthoracic Echocardiogram performed on 5/22 significant for no vegetations. Recommendation to complete a 2 week treatment with likely transition to fluconazole. Outpatient ophthalmology follow-up recommended. -Follow-up repeat blood cultures (5/22)  Burkholderia cepacia group bacteremia Blood cultures (5/19) significant for GNRs. Patient was on Ceftriaxone  prior to cultures. Presumed likely secondary to gallbladder with initial concern for cholecystitis. Ceftriaxone  and flagyl  switched to Ciprofloxacin  and Flagyl  per ID recommendations with further recommendations for a 7-day treatment duration and end date of 5/29.  Cholelithiasis Possible cholecystitis General surgery consulted. Patient underwent laparoscopic, converted to open, cholecystectomy on 5/23. Antibiotics adjusted per ID for GNR bacteremia.  ESRD on hemodialysis Nephrology consulted for HD during  hospitalization. Right internal jugular tunneled HD catheter removed on 5/19 and new right internal jugular tunneled HD catheter placed on 5/21.  Severe asymptomatic hypertension Improved with antihypertensive therapy and hemodialysis. -Continue amlodipine   Anemia of chronic disease Chronic and stable.  Thyroid  nodule Noted incidentally on CT scan imaging. Outpatient follow-up    DVT prophylaxis: Subcutaneous heparin  Code Status:   Code Status: Full Code Family Communication: None at bedside Disposition Plan: Discharge pending continued ID/general surgery recommendations/management   Consultants:  Nephrology Infectious disease General surgery Interventional radiology  Procedures:  TDC removal TDC placement Hemodialysis Transthoracic Echocardiogram Transesophageal Echocardiogram Laparoscopic, converted to open, cholecystectomy  Antimicrobials: Vancomycin Micafungin  Flagyl  Ceftriaxone  Azithromycin     Subjective: Patient has some abdominal pain where her incision is. No other concerns.  Objective: BP (!) 108/57 (BP Location: Right Arm)   Pulse 69   Temp (!) 97.5 F (36.4 C) (Oral)   Resp 20   Ht 5\' 4"  (1.626 m)   Wt 77.3 kg   SpO2 90%   BMI 29.25 kg/m   Examination:  General exam: Appears calm and comfortable Respiratory system: Clear to auscultation. Respiratory effort normal. Cardiovascular system: S1 & S2 heard, RRR. No murmurs, rubs, gallops or clicks. Gastrointestinal system: Abdomen is nondistended, soft and tender over her surgical incision. Normal bowel sounds heard. Honeycomb dressing is clean/dry/intact Central nervous system: Alert and oriented. No focal neurological deficits. Psychiatry: Judgement and insight appear normal. Mood & affect appropriate.    Data Reviewed: I have personally reviewed following labs and imaging studies  CBC Lab Results  Component Value Date   WBC 5.5 01/09/2024   RBC 3.11 (L) 01/09/2024   HGB 9.9 (L)  01/10/2024   HCT 29.0 (L) 01/10/2024   MCV 88.7 01/09/2024   MCH 29.3 01/09/2024   PLT 136 (L) 01/09/2024   MCHC 33.0 01/09/2024   RDW 16.4 (H) 01/09/2024  LYMPHSABS 0.2 (L) 01/03/2024   MONOABS 0.0 (L) 01/03/2024   EOSABS 0.1 01/03/2024   BASOSABS 0.0 01/03/2024     Last metabolic panel Lab Results  Component Value Date   NA 135 01/10/2024   K 3.9 01/10/2024   CL 97 (L) 01/10/2024   CO2 20 (L) 01/10/2024   BUN 31 (H) 01/10/2024   CREATININE 6.03 (H) 01/10/2024   GLUCOSE 163 (H) 01/10/2024   GFRNONAA 7 (L) 01/10/2024   GFRAA 50 (L) 11/21/2020   CALCIUM  9.0 01/10/2024   PHOS 4.5 01/10/2024   PROT 7.1 01/10/2024   ALBUMIN  2.7 (L) 01/10/2024   LABGLOB 3.0 03/24/2023   AGRATIO 1.0 03/24/2023   BILITOT 0.9 01/10/2024   ALKPHOS 119 01/10/2024   AST 51 (H) 01/10/2024   ALT 14 01/10/2024   ANIONGAP 18 (H) 01/10/2024    GFR: Estimated Creatinine Clearance: 9.9 mL/min (A) (by C-G formula based on SCr of 6.03 mg/dL (H)).  Recent Results (from the past 240 hours)  Resp panel by RT-PCR (RSV, Flu A&B, Covid) Anterior Nasal Swab     Status: None   Collection Time: 01/03/24  1:33 PM   Specimen: Anterior Nasal Swab  Result Value Ref Range Status   SARS Coronavirus 2 by RT PCR NEGATIVE NEGATIVE Final   Influenza A by PCR NEGATIVE NEGATIVE Final   Influenza B by PCR NEGATIVE NEGATIVE Final    Comment: (NOTE) The Xpert Xpress SARS-CoV-2/FLU/RSV plus assay is intended as an aid in the diagnosis of influenza from Nasopharyngeal swab specimens and should not be used as a sole basis for treatment. Nasal washings and aspirates are unacceptable for Xpert Xpress SARS-CoV-2/FLU/RSV testing.  Fact Sheet for Patients: BloggerCourse.com  Fact Sheet for Healthcare Providers: SeriousBroker.it  This test is not yet approved or cleared by the [place]  FDA and has been authorized for detection and/or diagnosis of SARS-CoV-2 by FDA  under an Emergency Use Authorization (EUA). This EUA will remain in effect (meaning this test can be used) for the duration of the COVID-19 declaration under Section 564(b)(1) of the Act, 21 U.S.C. section 360bbb-3(b)(1), unless the authorization is terminated or revoked.     Resp Syncytial Virus by PCR NEGATIVE NEGATIVE Final    Comment: (NOTE) Fact Sheet for Patients: BloggerCourse.com  Fact Sheet for Healthcare Providers: SeriousBroker.it  This test is not yet approved or cleared by the [place]  FDA and has been authorized for detection and/or diagnosis of SARS-CoV-2 by FDA under an Emergency Use Authorization (EUA). This EUA will remain in effect (meaning this test can be used) for the duration of the COVID-19 declaration under Section 564(b)(1) of the Act, 21 U.S.C. section 360bbb-3(b)(1), unless the authorization is terminated or revoked.  Performed at [facility], [address], [place], [place] [address]   Blood culture (routine x 2)     Status: Abnormal   Collection Time: 01/03/24  2:59 PM   Specimen: BLOOD RIGHT FOREARM  Result Value Ref Range Status   Specimen Description BLOOD RIGHT FOREARM  Final   Special Requests   Final    BOTTLES DRAWN AEROBIC AND ANAEROBIC Blood Culture adequate volume   Culture  Setup Time   Final    YEAST IN BOTH AEROBIC AND ANAEROBIC BOTTLES CRITICAL RESULT CALLED TO, READ BACK BY AND VERIFIED WITH: PHARMD AUSTIN PAYTES [id number] 1938 BY J RAZZAK, MT    Culture (A)  Final    CANDIDA TROPICALIS Sent to [facility] for further susceptibility testing. SEE SEPARATE REPORT Performed  at [facility], [address], [place], [place] [address]    Report Status 01/10/2024 FINAL  Final  Blood Culture ID Panel (Reflexed)     Status: Abnormal   Collection Time: 01/03/24  2:59 PM  Result Value Ref Range Status   Enterococcus faecalis NOT DETECTED NOT DETECTED Final   Enterococcus  Faecium NOT DETECTED NOT DETECTED Final   Listeria monocytogenes NOT DETECTED NOT DETECTED Final   Staphylococcus species NOT DETECTED NOT DETECTED Final   Staphylococcus aureus (BCID) NOT DETECTED NOT DETECTED Final   Staphylococcus epidermidis NOT DETECTED NOT DETECTED Final   Staphylococcus lugdunensis NOT DETECTED NOT DETECTED Final   Streptococcus species NOT DETECTED NOT DETECTED Final   Streptococcus agalactiae NOT DETECTED NOT DETECTED Final   Streptococcus pneumoniae NOT DETECTED NOT DETECTED Final   Streptococcus pyogenes NOT DETECTED NOT DETECTED Final   A.calcoaceticus-baumannii NOT DETECTED NOT DETECTED Final   Bacteroides fragilis NOT DETECTED NOT DETECTED Final   Enterobacterales NOT DETECTED NOT DETECTED Final   Enterobacter cloacae complex NOT DETECTED NOT DETECTED Final   Escherichia coli NOT DETECTED NOT DETECTED Final   Klebsiella aerogenes NOT DETECTED NOT DETECTED Final   Klebsiella oxytoca NOT DETECTED NOT DETECTED Final   Klebsiella pneumoniae NOT DETECTED NOT DETECTED Final   Proteus species NOT DETECTED NOT DETECTED Final   Salmonella species NOT DETECTED NOT DETECTED Final   Serratia marcescens NOT DETECTED NOT DETECTED Final   Haemophilus influenzae NOT DETECTED NOT DETECTED Final   Neisseria meningitidis NOT DETECTED NOT DETECTED Final   Pseudomonas aeruginosa NOT DETECTED NOT DETECTED Final   Stenotrophomonas maltophilia NOT DETECTED NOT DETECTED Final   Candida albicans NOT DETECTED NOT DETECTED Final   Candida auris NOT DETECTED NOT DETECTED Final   Candida glabrata NOT DETECTED NOT DETECTED Final   Candida krusei NOT DETECTED NOT DETECTED Final   Candida parapsilosis NOT DETECTED NOT DETECTED Final   Candida tropicalis DETECTED (A) NOT DETECTED Final    Comment: CRITICAL RESULT CALLED TO, READ BACK BY AND VERIFIED WITH: PHARMD AUSTIN PAYTES [id number] 1938 BY J RAZZAK, MT    Cryptococcus neoformans/gattii NOT DETECTED NOT DETECTED Final    Comment:  Performed at [facility], [address], [place], [place] [address]  Yeast Susceptibilities     Status: None (Preliminary result)   Collection Time: 01/03/24  2:59 PM  Result Value Ref Range Status   SOURCE PENDING  Incomplete   Organism ID, Yeast Candida tropicalis  Final    Comment: (NOTE) Identification performed by account, not confirmed by this laboratory.    Amphotericin B MIC 1.0 ug/mL  Final    Comment: (NOTE) Breakpoints have been established for only some organism-drug combinations as indicated. This test was developed and its performance characteristics determined by [facility]. It has not been cleared or approved by the Food and Drug Administration.    Anidulafungin MIC Comment  Final    Comment: (NOTE) 0.12 ug/mL Susceptible Breakpoints have been established for only some organism-drug combinations as indicated. This test was developed and its performance characteristics determined by [facility]. It has not been cleared or approved by the Food and Drug Administration.    Caspofungin MIC Comment  Final    Comment: (NOTE) 0.25 ug/mL Susceptible Breakpoints have been established for only some organism-drug combinations as indicated. This test was developed and its performance characteristics determined by [facility]. It has not been cleared or approved by the Food and Drug Administration.    Fluconazole Islt MIC 2.0  ug/mL Susceptible  Final    Comment: (NOTE) Breakpoints have been established for only some organism-drug combinations as indicated. This test was developed and its performance characteristics determined by [facility]. It has not been cleared or approved by the Food and Drug Administration.    ISAVUCONAZOLE MIC 0.12 ug/mL  Final    Comment: (NOTE) This test was developed and its performance characteristics determined by [facility]. It has not been cleared or approved by the Food and Drug Administration.    Itraconazole MIC 0.25 ug/mL  Final     Comment: (NOTE) Breakpoints have been established for only some organism-drug combinations as indicated. This test was developed and its performance characteristics determined by [facility]. It has not been cleared or approved by the Food and Drug Administration.    Micafungin  MIC Comment  Final    Comment: (NOTE) 0.06 ug/mL Susceptible Breakpoints have been established for only some organism-drug combinations as indicated. This test was developed and its performance characteristics determined by [facility]. It has not been cleared or approved by the Food and Drug Administration.    Posaconazole MIC 1.0 ug/mL  Final    Comment: (NOTE) Breakpoints have been established for only some organism-drug combinations as indicated. This test was developed and its performance characteristics determined by [facility]. It has not been cleared or approved by the Food and Drug Administration.    REZAFUNGIN MIC Comment  Final    Comment: (NOTE) 0.25 ug/mL Susceptible This test was developed and its performance characteristics determined by [facility]. It has not been cleared or approved by the Food and Drug Administration.    Voriconazole MIC Comment  Final    Comment: (NOTE) 0.5 ug/mL Intermediate Breakpoints have been established for only some organism-drug combinations as indicated. This test was developed and its performance characteristics determined by [facility]. It has not been cleared or approved by the Food and Drug Administration. Performed At: [facility] [address] [place], [place] [id number] Pearlean Botts MD YQ:[id number]   Blood culture (routine x 2)     Status: Abnormal   Collection Time: 01/04/24  5:39 AM   Specimen: BLOOD RIGHT ARM  Result Value Ref Range Status   Specimen Description BLOOD RIGHT ARM  Final   Special Requests   Final    BOTTLES DRAWN AEROBIC AND ANAEROBIC Blood Culture adequate volume   Culture  Setup Time   Final    BUDDING YEAST SEEN AEROBIC  BOTTLE ONLY Gram Stain Report Called to,Read Back By and Verified With: PHARMD J LEDFORD 01/06/2024 @ 0059 BY AB Performed at [facility], [address] [place], [place] [address]    Culture CANDIDA TROPICALIS (A)  Final   Report Status 01/07/2024 FINAL  Final  Respiratory (~20 pathogens) panel by PCR     Status: None   Collection Time: 01/04/24 11:35 PM   Specimen: Nasopharyngeal Swab; Respiratory  Result Value Ref Range Status   Adenovirus NOT DETECTED NOT DETECTED Final   Coronavirus 229E NOT DETECTED NOT DETECTED Final    Comment: (NOTE) The Coronavirus on the Respiratory Panel, DOES NOT test for the novel  Coronavirus (2019 nCoV)    Coronavirus HKU1 NOT DETECTED NOT DETECTED Final   Coronavirus NL63 NOT DETECTED NOT DETECTED Final   Coronavirus OC43 NOT DETECTED NOT DETECTED Final   Metapneumovirus NOT DETECTED NOT DETECTED Final   Rhinovirus / Enterovirus NOT DETECTED NOT DETECTED Final   Influenza A NOT DETECTED NOT DETECTED Final   Influenza B NOT DETECTED NOT DETECTED Final  Parainfluenza Virus 1 NOT DETECTED NOT DETECTED Final   Parainfluenza Virus 2 NOT DETECTED NOT DETECTED Final   Parainfluenza Virus 3 NOT DETECTED NOT DETECTED Final   Parainfluenza Virus 4 NOT DETECTED NOT DETECTED Final   Respiratory Syncytial Virus NOT DETECTED NOT DETECTED Final   Bordetella pertussis NOT DETECTED NOT DETECTED Final   Bordetella Parapertussis NOT DETECTED NOT DETECTED Final   Chlamydophila pneumoniae NOT DETECTED NOT DETECTED Final   Mycoplasma pneumoniae NOT DETECTED NOT DETECTED Final    Comment: Performed at [facility], [address], [place], [place] [address]  Culture, blood (Routine X 2) w Reflex to ID Panel     Status: Abnormal   Collection Time: 01/06/24  8:13 AM   Specimen: BLOOD  Result Value Ref Range Status   Specimen Description BLOOD SITE NOT SPECIFIED  Final   Special Requests   Final    BOTTLES DRAWN AEROBIC AND ANAEROBIC Blood Culture results  may not be optimal due to an inadequate volume of blood received in culture bottles   Culture  Setup Time   Final    GRAM NEGATIVE RODS BOTTLES DRAWN AEROBIC ONLY CRITICAL RESULT CALLED TO, READ BACK BY AND VERIFIED WITH: V BRYK,PHARMD@0054  01/09/24 MK    Culture (A)  Final    BURKHOLDERIA SPECIES BURKHOLDERIA CEPACIA GROUP Performed at [facility], [address], [place], [place] [address]    Report Status 01/10/2024 FINAL  Final   Organism ID, Bacteria BURKHOLDERIA SPECIES  Final      Susceptibility   Burkholderia species - MIC*    CEFEPIME >=32 RESISTANT Resistant     GENTAMICIN <=1 SENSITIVE Sensitive     CIPROFLOXACIN  <=0.25 SENSITIVE Sensitive     IMIPENEM 2 SENSITIVE Sensitive     TRIMETH/SULFA <=20 SENSITIVE Sensitive     * BURKHOLDERIA SPECIES  Culture, blood (Routine X 2) w Reflex to ID Panel     Status: None (Preliminary result)   Collection Time: 01/06/24  8:13 AM   Specimen: BLOOD  Result Value Ref Range Status   Specimen Description BLOOD SITE NOT SPECIFIED  Final   Special Requests   Final    BOTTLES DRAWN AEROBIC AND ANAEROBIC Blood Culture results may not be optimal due to an inadequate volume of blood received in culture bottles   Culture  Setup Time   Final    GRAM NEGATIVE RODS ANAEROBIC BOTTLE ONLY CRITICAL VALUE NOTED.  VALUE IS CONSISTENT WITH PREVIOUSLY REPORTED AND CALLED VALUE.    Culture   Final    GRAM NEGATIVE RODS CULTURE REINCUBATED FOR BETTER GROWTH Performed at [facility], [address], [place], [place] [address]    Report Status PENDING  Incomplete  Blood Culture ID Panel (Reflexed)     Status: None   Collection Time: 01/06/24  8:13 AM  Result Value Ref Range Status   Enterococcus faecalis NOT DETECTED NOT DETECTED Final   Enterococcus Faecium NOT DETECTED NOT DETECTED Final   Listeria monocytogenes NOT DETECTED NOT DETECTED Final   Staphylococcus species NOT DETECTED NOT DETECTED Final   Staphylococcus aureus (BCID)  NOT DETECTED NOT DETECTED Final   Staphylococcus epidermidis NOT DETECTED NOT DETECTED Final   Staphylococcus lugdunensis NOT DETECTED NOT DETECTED Final   Streptococcus species NOT DETECTED NOT DETECTED Final   Streptococcus agalactiae NOT DETECTED NOT DETECTED Final   Streptococcus pneumoniae NOT DETECTED NOT DETECTED Final   Streptococcus pyogenes NOT DETECTED NOT DETECTED Final   A.calcoaceticus-baumannii NOT DETECTED NOT  DETECTED Final   Bacteroides fragilis NOT DETECTED NOT DETECTED Final   Enterobacterales NOT DETECTED NOT DETECTED Final   Enterobacter cloacae complex NOT DETECTED NOT DETECTED Final   Escherichia coli NOT DETECTED NOT DETECTED Final   Klebsiella aerogenes NOT DETECTED NOT DETECTED Final   Klebsiella oxytoca NOT DETECTED NOT DETECTED Final   Klebsiella pneumoniae NOT DETECTED NOT DETECTED Final   Proteus species NOT DETECTED NOT DETECTED Final   Salmonella species NOT DETECTED NOT DETECTED Final   Serratia marcescens NOT DETECTED NOT DETECTED Final   Haemophilus influenzae NOT DETECTED NOT DETECTED Final   Neisseria meningitidis NOT DETECTED NOT DETECTED Final   Pseudomonas aeruginosa NOT DETECTED NOT DETECTED Final   Stenotrophomonas maltophilia NOT DETECTED NOT DETECTED Final   Candida albicans NOT DETECTED NOT DETECTED Final   Candida auris NOT DETECTED NOT DETECTED Final   Candida glabrata NOT DETECTED NOT DETECTED Final   Candida krusei NOT DETECTED NOT DETECTED Final   Candida parapsilosis NOT DETECTED NOT DETECTED Final   Candida tropicalis NOT DETECTED NOT DETECTED Final   Cryptococcus neoformans/gattii NOT DETECTED NOT DETECTED Final    Comment: Performed at [facility], [address], [place], [place] [address]  Culture, blood (Routine X 2) w Reflex to ID Panel     Status: None (Preliminary result)   Collection Time: 01/06/24  8:22 PM   Specimen: BLOOD  Result Value Ref Range Status   Specimen Description BLOOD SITE NOT SPECIFIED  Final    Special Requests   Final    BOTTLES DRAWN AEROBIC AND ANAEROBIC Blood Culture adequate volume   Culture  Setup Time   Final    GRAM NEGATIVE RODS AEROBIC BOTTLE ONLY CRITICAL VALUE NOTED.  VALUE IS CONSISTENT WITH PREVIOUSLY REPORTED AND CALLED VALUE.    Culture   Final    GRAM NEGATIVE RODS CULTURE REINCUBATED FOR BETTER GROWTH Performed at [facility], [address] [place], [place] [address]    Report Status PENDING  Incomplete  Culture, blood (Routine X 2) w Reflex to ID Panel     Status: None (Preliminary result)   Collection Time: 01/06/24  8:22 PM   Specimen: BLOOD  Result Value Ref Range Status   Specimen Description BLOOD SITE NOT SPECIFIED  Final   Special Requests   Final    BOTTLES DRAWN AEROBIC AND ANAEROBIC Blood Culture adequate volume   Culture  Setup Time   Final    GRAM NEGATIVE RODS AEROBIC BOTTLE ONLY CRITICAL VALUE NOTED.  VALUE IS CONSISTENT WITH PREVIOUSLY REPORTED AND CALLED VALUE.    Culture   Final    GRAM NEGATIVE RODS CULTURE REINCUBATED FOR BETTER GROWTH Performed at [facility], [address], [place], [place] [address]    Report Status PENDING  Incomplete  Culture, blood (Routine X 2) w Reflex to ID Panel     Status: None (Preliminary result)   Collection Time: 01/09/24  2:27 PM   Specimen: BLOOD  Result Value Ref Range Status   Specimen Description BLOOD SITE NOT SPECIFIED  Final   Special Requests   Final    BOTTLES DRAWN AEROBIC AND ANAEROBIC Blood Culture adequate volume   Culture   Final    NO GROWTH 2 DAYS Performed at [facility], [address], [place], [place] [address]    Report Status PENDING  Incomplete  Culture, blood (Routine X 2) w Reflex to ID Panel     Status: None (Preliminary result)  Collection Time: 01/09/24  2:27 PM   Specimen: BLOOD  Result Value Ref Range Status   Specimen Description BLOOD SITE NOT SPECIFIED  Final   Special Requests   Final    BOTTLES DRAWN AEROBIC AND ANAEROBIC Blood  Culture adequate volume   Culture   Final    NO GROWTH 2 DAYS Performed at [facility], [address], [place], [place] [address]    Report Status PENDING  Incomplete      Radiology Studies: ECHO TEE Result Date: 01/09/2024    TRANSESOPHOGEAL ECHO REPORT   Patient Name:   KRISHA BEEGLE Date of Exam: 01/09/2024 Medical Rec #:  [id number]               Height:       64.0 in Accession #:    [id number]              Weight:       168.7 lb Date of Birth:  08-15-1961               BSA:          1.820 m Patient Age:    63 years                BP:           170/81 mmHg Patient Gender: F                       HR:           75 bpm. Exam Location:  Inpatient Procedure: Transesophageal Echo, Cardiac Doppler and Color Doppler (Both            Spectral and Color Flow Doppler were utilized during procedure). Indications:     endocarditis  History:         Patient has prior history of Echocardiogram examinations, most                  recent 01/05/2024. CHF, CAD, COPD and End stage renal disease;                  Risk Factors:Hypertension and Diabetes.  Sonographer:     Kip Peon RDCS Referring Phys:  [id number] Dellis Fermo Diagnosing Phys: Dinah Franco MD PROCEDURE: After discussion of the risks and benefits of a TEE, an informed consent was obtained from the patient. The transesophogeal probe was passed without difficulty through the esophogus of the patient. Imaged were obtained with the patient in a left lateral decubitus position. Sedation performed by different physician. The patient was monitored while under deep sedation. Anesthestetic sedation was provided intravenously by Anesthesiology: 170mg  of Propofol . The patient developed no complications during the procedure.  IMPRESSIONS  1. Left ventricular ejection fraction, by estimation, is 70 to 75%. The left ventricle has hyperdynamic function. There is mild left ventricular hypertrophy.  2. Right ventricular systolic function is normal. The right  ventricular size is normal.  3. Left atrial size was mildly dilated. No left atrial/left atrial appendage thrombus was detected.  4. Right atrial size was mildly dilated.  5. The mitral valve is grossly normal. Mild mitral valve regurgitation.  6. The aortic valve is tricuspid. Aortic valve regurgitation is not visualized. Conclusion(s)/Recommendation(s): No evidence of vegetation/infective endocarditis on this transesophageael echocardiogram. FINDINGS  Left Ventricle: Left ventricular ejection fraction, by estimation, is 70 to 75%. The left ventricle has hyperdynamic function. The left ventricular internal cavity size was normal in  size. There is mild left ventricular hypertrophy. Right Ventricle: The right ventricular size is normal. No increase in right ventricular wall thickness. Right ventricular systolic function is normal. Left Atrium: Left atrial size was mildly dilated. No left atrial/left atrial appendage thrombus was detected. Right Atrium: Right atrial size was mildly dilated. Pericardium: Trivial pericardial effusion is present. The pericardial effusion is circumferential. Mitral Valve: The mitral valve is grossly normal. Mild mitral valve regurgitation. Tricuspid Valve: The tricuspid valve is grossly normal. Tricuspid valve regurgitation is mild. Aortic Valve: The aortic valve is tricuspid. Aortic valve regurgitation is not visualized. Pulmonic Valve: The pulmonic valve was grossly normal. Pulmonic valve regurgitation is trivial. Aorta: The aortic root and ascending aorta are structurally normal, with no evidence of dilitation. IAS/Shunts: No atrial level shunt detected by color flow Doppler. Additional Comments: Spectral Doppler performed. LEFT VENTRICLE PLAX 2D LVOT diam:     2.10 cm LVOT Area:     3.46 cm   AORTA Ao Asc diam: 3.60 cm  SHUNTS Systemic Diam: 2.10 cm Dinah Franco MD Electronically signed by Dinah Franco MD Signature Date/Time: 01/09/2024/2:15:29 PM    Final    EP STUDY Result  Date: 01/09/2024 See surgical note for result.     LOS: 8 days    Aneita Keens, MD [facility] 01/11/2024, 8:52 AM   If 7PM-7AM, please contact night-coverage [email or web address]

## 2024-01-12 DIAGNOSIS — R6520 Severe sepsis without septic shock: Secondary | ICD-10-CM | POA: Diagnosis not present

## 2024-01-12 DIAGNOSIS — B377 Candidal sepsis: Secondary | ICD-10-CM | POA: Diagnosis not present

## 2024-01-12 LAB — CBC
HCT: 26 % — ABNORMAL LOW (ref 36.0–46.0)
Hemoglobin: 8.3 g/dL — ABNORMAL LOW (ref 12.0–15.0)
MCH: 29.3 pg (ref 26.0–34.0)
MCHC: 31.9 g/dL (ref 30.0–36.0)
MCV: 91.9 fL (ref 80.0–100.0)
Platelets: 234 10*3/uL (ref 150–400)
RBC: 2.83 MIL/uL — ABNORMAL LOW (ref 3.87–5.11)
RDW: 17 % — ABNORMAL HIGH (ref 11.5–15.5)
WBC: 10 10*3/uL (ref 4.0–10.5)
nRBC: 0 % (ref 0.0–0.2)

## 2024-01-12 LAB — RENAL FUNCTION PANEL
Albumin: 2.5 g/dL — ABNORMAL LOW (ref 3.5–5.0)
Anion gap: 10 (ref 5–15)
BUN: 31 mg/dL — ABNORMAL HIGH (ref 8–23)
CO2: 25 mmol/L (ref 22–32)
Calcium: 8.4 mg/dL — ABNORMAL LOW (ref 8.9–10.3)
Chloride: 100 mmol/L (ref 98–111)
Creatinine, Ser: 5.64 mg/dL — ABNORMAL HIGH (ref 0.44–1.00)
GFR, Estimated: 8 mL/min — ABNORMAL LOW (ref >60)
Glucose, Bld: 86 mg/dL (ref 70–99)
Phosphorus: 5.4 mg/dL — ABNORMAL HIGH (ref 2.5–4.6)
Potassium: 3.7 mmol/L (ref 3.5–5.1)
Sodium: 135 mmol/L (ref 135–145)

## 2024-01-12 MED ORDER — SENNOSIDES-DOCUSATE SODIUM 8.6-50 MG PO TABS
1.0000 | ORAL_TABLET | Freq: Two times a day (BID) | ORAL | Status: DC
  Administered 2024-01-12 – 2024-01-16 (×6): 1 via ORAL
  Filled 2024-01-12 (×8): qty 1

## 2024-01-12 MED ORDER — CHLORHEXIDINE GLUCONATE CLOTH 2 % EX PADS
6.0000 | MEDICATED_PAD | Freq: Every day | CUTANEOUS | Status: DC
Start: 2024-01-13 — End: 2024-01-16
  Administered 2024-01-13: 6 via TOPICAL

## 2024-01-12 MED ORDER — POLYETHYLENE GLYCOL 3350 17 G PO PACK
17.0000 g | PACK | Freq: Every day | ORAL | Status: DC
  Administered 2024-01-12 – 2024-01-15 (×3): 17 g via ORAL
  Filled 2024-01-12 (×5): qty 1

## 2024-01-12 MED ORDER — HYDROMORPHONE HCL 1 MG/ML IJ SOLN
0.5000 mg | INTRAMUSCULAR | Status: DC | PRN
  Administered 2024-01-14: 0.5 mg via INTRAVENOUS
  Filled 2024-01-12: qty 1

## 2024-01-12 MED ORDER — OXYCODONE HCL 5 MG PO TABS
5.0000 mg | ORAL_TABLET | ORAL | Status: DC | PRN
  Administered 2024-01-12 – 2024-01-14 (×7): 10 mg via ORAL
  Administered 2024-01-14: 5 mg via ORAL
  Administered 2024-01-15 – 2024-01-16 (×4): 10 mg via ORAL
  Filled 2024-01-12 (×4): qty 2
  Filled 2024-01-12: qty 1
  Filled 2024-01-12 (×6): qty 2

## 2024-01-12 NOTE — Progress Notes (Signed)
 PROGRESS NOTE    Minah Axelrod  ZOX:[id number] DOB: 1960/08/21 DOA: 01/03/2024 PCP: [facility]   Brief Narrative: Makenzi Bannister is a 63 y.o. female with a history of ESRD on hemodialysis, hyperlipidemia, GERD, pulm hypertension.  Patient presented secondary to upper chest pain and shortness of breath in addition to fever and chills and found to have evidence of sepsis secondary to candidemia.  Infectious ease consulted.  Hospitalization also complicated by evidence of cholelithiasis s/p laparoscopic, converted to open, cholecystectomy on 5/23.  Repeat blood cultures (5/19) growing GNRs. Antibiotics regimen adjusted per ID recommendations.   Assessment and Plan:  Sepsis Present on admission secondary to candidia tropicalis candidemia.  Fungemia In setting of HD cath. Noted on blood cultures (5/16 and 5/17). Cultures positive for candida tropicalis. ID consulted during this admission for management. Patient managed on Micafungin . Prior to admission [facility] removed on 5/19 and new TDC placed on 5/21.  Transthoracic Echocardiogram performed on 5/22 significant for no vegetations. Recommendation to complete a 2 week treatment with likely transition to fluconazole. Outpatient ophthalmology follow-up recommended. -Follow-up repeat blood cultures (5/22)  Burkholderia cepacia group bacteremia Blood cultures (5/19) significant for GNRs. Patient was on Ceftriaxone  prior to cultures. Presumed likely secondary to gallbladder with initial concern for cholecystitis. Ceftriaxone  and flagyl  switched to Ciprofloxacin  and Flagyl  per ID recommendations with further recommendations for a 7-day treatment duration and end date of 5/29.  Cholelithiasis Possible cholecystitis General surgery consulted. Patient underwent laparoscopic, converted to open, cholecystectomy on 5/23. Antibiotics adjusted per ID for GNR bacteremia.  ESRD on hemodialysis Nephrology consulted for HD during  hospitalization. Right internal jugular tunneled HD catheter removed on 5/19 and new right internal jugular tunneled HD catheter placed on 5/21.  Severe asymptomatic hypertension Improved with antihypertensive therapy and hemodialysis. -Continue amlodipine   Anemia of chronic disease Chronic and stable.  Thyroid  nodule Noted incidentally on CT scan imaging. Outpatient follow-up.    DVT prophylaxis: Subcutaneous heparin  Code Status:   Code Status: Full Code Family Communication: None at bedside Disposition Plan: Discharge pending continued ID/general surgery recommendations/management   Consultants:  Nephrology Infectious disease General surgery Interventional radiology  Procedures:  TDC removal TDC placement Hemodialysis Transthoracic Echocardiogram Transesophageal Echocardiogram Laparoscopic, converted to open, cholecystectomy  Antimicrobials: Vancomycin Micafungin  Flagyl  Ceftriaxone  Azithromycin     Subjective: Continued abdominal pain but this is improving overall.  Objective: BP 126/73 (BP Location: Right Arm)   Pulse 74   Temp (!) 97.5 F (36.4 C)   Resp 18   Ht 5\' 4"  (1.626 m)   Wt 77.3 kg   SpO2 92%   BMI 29.25 kg/m   Examination:  General exam: Appears calm and comfortable Respiratory system: Clear to auscultation. Respiratory effort normal. Cardiovascular system: S1 & S2 heard, RRR. No murmurs, rubs, gallops or clicks. Gastrointestinal system: Abdomen is nondistended, soft and tender over incision dressing. Normal bowel sounds heard. Central nervous system: Alert and oriented. No focal neurological deficits. Psychiatry: Judgement and insight appear normal. Mood & affect appropriate.    Data Reviewed: I have personally reviewed following labs and imaging studies  CBC Lab Results  Component Value Date   WBC 10.0 01/12/2024   RBC 2.83 (L) 01/12/2024   HGB 8.3 (L) 01/12/2024   HCT 26.0 (L) 01/12/2024   MCV 91.9 01/12/2024   MCH 29.3  01/12/2024   PLT 234 01/12/2024   MCHC 31.9 01/12/2024   RDW 17.0 (H) 01/12/2024   LYMPHSABS 0.2 (L) 01/03/2024   MONOABS 0.0 (L) 01/03/2024  EOSABS 0.1 01/03/2024   BASOSABS 0.0 01/03/2024     Last metabolic panel Lab Results  Component Value Date   NA 135 01/12/2024   K 3.7 01/12/2024   CL 100 01/12/2024   CO2 25 01/12/2024   BUN 31 (H) 01/12/2024   CREATININE 5.64 (H) 01/12/2024   GLUCOSE 86 01/12/2024   GFRNONAA 8 (L) 01/12/2024   GFRAA 50 (L) 11/21/2020   CALCIUM  8.4 (L) 01/12/2024   PHOS 5.4 (H) 01/12/2024   PROT 6.4 (L) 01/11/2024   ALBUMIN  2.5 (L) 01/12/2024   LABGLOB 3.0 03/24/2023   AGRATIO 1.0 03/24/2023   BILITOT 0.8 01/11/2024   ALKPHOS 93 01/11/2024   AST 36 01/11/2024   ALT 14 01/11/2024   ANIONGAP 10 01/12/2024    GFR: Estimated Creatinine Clearance: 10.5 mL/min (A) (by C-G formula based on SCr of 5.64 mg/dL (H)).  Recent Results (from the past 240 hours)  Resp panel by RT-PCR (RSV, Flu A&B, Covid) Anterior Nasal Swab     Status: None   Collection Time: 01/03/24  1:33 PM   Specimen: Anterior Nasal Swab  Result Value Ref Range Status   SARS Coronavirus 2 by RT PCR NEGATIVE NEGATIVE Final   Influenza A by PCR NEGATIVE NEGATIVE Final   Influenza B by PCR NEGATIVE NEGATIVE Final    Comment: (NOTE) The Xpert Xpress SARS-CoV-2/FLU/RSV plus assay is intended as an aid in the diagnosis of influenza from Nasopharyngeal swab specimens and should not be used as a sole basis for treatment. Nasal washings and aspirates are unacceptable for Xpert Xpress SARS-CoV-2/FLU/RSV testing.  Fact Sheet for Patients: BloggerCourse.com  Fact Sheet for Healthcare Providers: SeriousBroker.it  This test is not yet approved or cleared by the [place]  FDA and has been authorized for detection and/or diagnosis of SARS-CoV-2 by FDA under an Emergency Use Authorization (EUA). This EUA will remain in effect (meaning  this test can be used) for the duration of the COVID-19 declaration under Section 564(b)(1) of the Act, 21 U.S.C. section 360bbb-3(b)(1), unless the authorization is terminated or revoked.     Resp Syncytial Virus by PCR NEGATIVE NEGATIVE Final    Comment: (NOTE) Fact Sheet for Patients: BloggerCourse.com  Fact Sheet for Healthcare Providers: SeriousBroker.it  This test is not yet approved or cleared by the [place]  FDA and has been authorized for detection and/or diagnosis of SARS-CoV-2 by FDA under an Emergency Use Authorization (EUA). This EUA will remain in effect (meaning this test can be used) for the duration of the COVID-19 declaration under Section 564(b)(1) of the Act, 21 U.S.C. section 360bbb-3(b)(1), unless the authorization is terminated or revoked.  Performed at [facility], [address], [place], [place] [address]   Blood culture (routine x 2)     Status: Abnormal   Collection Time: 01/03/24  2:59 PM   Specimen: BLOOD RIGHT FOREARM  Result Value Ref Range Status   Specimen Description BLOOD RIGHT FOREARM  Final   Special Requests   Final    BOTTLES DRAWN AEROBIC AND ANAEROBIC Blood Culture adequate volume   Culture  Setup Time   Final    YEAST IN BOTH AEROBIC AND ANAEROBIC BOTTLES CRITICAL RESULT CALLED TO, READ BACK BY AND VERIFIED WITH: PHARMD AUSTIN PAYTES [id number] 1938 BY J RAZZAK, MT    Culture (A)  Final    CANDIDA TROPICALIS Sent to [facility] for further susceptibility testing. SEE SEPARATE REPORT Performed at [facility], [address] [place], [place] [address]  Report Status 01/10/2024 FINAL  Final  Blood Culture ID Panel (Reflexed)     Status: Abnormal   Collection Time: 01/03/24  2:59 PM  Result Value Ref Range Status   Enterococcus faecalis NOT DETECTED NOT DETECTED Final   Enterococcus Faecium NOT DETECTED NOT DETECTED Final   Listeria monocytogenes NOT DETECTED NOT  DETECTED Final   Staphylococcus species NOT DETECTED NOT DETECTED Final   Staphylococcus aureus (BCID) NOT DETECTED NOT DETECTED Final   Staphylococcus epidermidis NOT DETECTED NOT DETECTED Final   Staphylococcus lugdunensis NOT DETECTED NOT DETECTED Final   Streptococcus species NOT DETECTED NOT DETECTED Final   Streptococcus agalactiae NOT DETECTED NOT DETECTED Final   Streptococcus pneumoniae NOT DETECTED NOT DETECTED Final   Streptococcus pyogenes NOT DETECTED NOT DETECTED Final   A.calcoaceticus-baumannii NOT DETECTED NOT DETECTED Final   Bacteroides fragilis NOT DETECTED NOT DETECTED Final   Enterobacterales NOT DETECTED NOT DETECTED Final   Enterobacter cloacae complex NOT DETECTED NOT DETECTED Final   Escherichia coli NOT DETECTED NOT DETECTED Final   Klebsiella aerogenes NOT DETECTED NOT DETECTED Final   Klebsiella oxytoca NOT DETECTED NOT DETECTED Final   Klebsiella pneumoniae NOT DETECTED NOT DETECTED Final   Proteus species NOT DETECTED NOT DETECTED Final   Salmonella species NOT DETECTED NOT DETECTED Final   Serratia marcescens NOT DETECTED NOT DETECTED Final   Haemophilus influenzae NOT DETECTED NOT DETECTED Final   Neisseria meningitidis NOT DETECTED NOT DETECTED Final   Pseudomonas aeruginosa NOT DETECTED NOT DETECTED Final   Stenotrophomonas maltophilia NOT DETECTED NOT DETECTED Final   Candida albicans NOT DETECTED NOT DETECTED Final   Candida auris NOT DETECTED NOT DETECTED Final   Candida glabrata NOT DETECTED NOT DETECTED Final   Candida krusei NOT DETECTED NOT DETECTED Final   Candida parapsilosis NOT DETECTED NOT DETECTED Final   Candida tropicalis DETECTED (A) NOT DETECTED Final    Comment: CRITICAL RESULT CALLED TO, READ BACK BY AND VERIFIED WITH: PHARMD AUSTIN PAYTES [id number] 1938 BY J RAZZAK, MT    Cryptococcus neoformans/gattii NOT DETECTED NOT DETECTED Final    Comment: Performed at [facility], [address], [place], [place] [address]  Yeast  Susceptibilities     Status: None (Preliminary result)   Collection Time: 01/03/24  2:59 PM  Result Value Ref Range Status   SOURCE PENDING  Incomplete   Organism ID, Yeast Candida tropicalis  Final    Comment: (NOTE) Identification performed by account, not confirmed by this laboratory.    Amphotericin B MIC 1.0 ug/mL  Final    Comment: (NOTE) Breakpoints have been established for only some organism-drug combinations as indicated. This test was developed and its performance characteristics determined by [facility]. It has not been cleared or approved by the Food and Drug Administration.    Anidulafungin MIC Comment  Final    Comment: (NOTE) 0.12 ug/mL Susceptible Breakpoints have been established for only some organism-drug combinations as indicated. This test was developed and its performance characteristics determined by [facility]. It has not been cleared or approved by the Food and Drug Administration.    Caspofungin MIC Comment  Final    Comment: (NOTE) 0.25 ug/mL Susceptible Breakpoints have been established for only some organism-drug combinations as indicated. This test was developed and its performance characteristics determined by [facility]. It has not been cleared or approved by the Food and Drug Administration.    Fluconazole Islt MIC 2.0 ug/mL Susceptible  Final    Comment: (NOTE) Breakpoints have been established for only  some organism-drug combinations as indicated. This test was developed and its performance characteristics determined by [facility]. It has not been cleared or approved by the Food and Drug Administration.    ISAVUCONAZOLE MIC 0.12 ug/mL  Final    Comment: (NOTE) This test was developed and its performance characteristics determined by [facility]. It has not been cleared or approved by the Food and Drug Administration.    Itraconazole MIC 0.25 ug/mL  Final    Comment: (NOTE) Breakpoints have been established for only some  organism-drug combinations as indicated. This test was developed and its performance characteristics determined by [facility]. It has not been cleared or approved by the Food and Drug Administration.    Micafungin  MIC Comment  Final    Comment: (NOTE) 0.06 ug/mL Susceptible Breakpoints have been established for only some organism-drug combinations as indicated. This test was developed and its performance characteristics determined by [facility]. It has not been cleared or approved by the Food and Drug Administration.    Posaconazole MIC 1.0 ug/mL  Final    Comment: (NOTE) Breakpoints have been established for only some organism-drug combinations as indicated. This test was developed and its performance characteristics determined by [facility]. It has not been cleared or approved by the Food and Drug Administration.    REZAFUNGIN MIC Comment  Final    Comment: (NOTE) 0.25 ug/mL Susceptible This test was developed and its performance characteristics determined by [facility]. It has not been cleared or approved by the Food and Drug Administration.    Voriconazole MIC Comment  Final    Comment: (NOTE) 0.5 ug/mL Intermediate Breakpoints have been established for only some organism-drug combinations as indicated. This test was developed and its performance characteristics determined by [facility]. It has not been cleared or approved by the Food and Drug Administration. Performed At: [facility] [address] [place], [place] [id number] Pearlean Botts MD WU:[id number]   Blood culture (routine x 2)     Status: Abnormal   Collection Time: 01/04/24  5:39 AM   Specimen: BLOOD RIGHT ARM  Result Value Ref Range Status   Specimen Description BLOOD RIGHT ARM  Final   Special Requests   Final    BOTTLES DRAWN AEROBIC AND ANAEROBIC Blood Culture adequate volume   Culture  Setup Time   Final    BUDDING YEAST SEEN AEROBIC BOTTLE ONLY Gram Stain Report Called to,Read Back By and Verified  With: PHARMD J LEDFORD 01/06/2024 @ 0059 BY AB Performed at [facility], [address] [place], [place] [address]    Culture CANDIDA TROPICALIS (A)  Final   Report Status 01/07/2024 FINAL  Final  Respiratory (~20 pathogens) panel by PCR     Status: None   Collection Time: 01/04/24 11:35 PM   Specimen: Nasopharyngeal Swab; Respiratory  Result Value Ref Range Status   Adenovirus NOT DETECTED NOT DETECTED Final   Coronavirus 229E NOT DETECTED NOT DETECTED Final    Comment: (NOTE) The Coronavirus on the Respiratory Panel, DOES NOT test for the novel  Coronavirus (2019 nCoV)    Coronavirus HKU1 NOT DETECTED NOT DETECTED Final   Coronavirus NL63 NOT DETECTED NOT DETECTED Final   Coronavirus OC43 NOT DETECTED NOT DETECTED Final   Metapneumovirus NOT DETECTED NOT DETECTED Final   Rhinovirus / Enterovirus NOT DETECTED NOT DETECTED Final   Influenza A NOT DETECTED NOT DETECTED Final   Influenza B NOT DETECTED NOT DETECTED Final   Parainfluenza Virus 1 NOT DETECTED NOT DETECTED Final   Parainfluenza Virus 2  NOT DETECTED NOT DETECTED Final   Parainfluenza Virus 3 NOT DETECTED NOT DETECTED Final   Parainfluenza Virus 4 NOT DETECTED NOT DETECTED Final   Respiratory Syncytial Virus NOT DETECTED NOT DETECTED Final   Bordetella pertussis NOT DETECTED NOT DETECTED Final   Bordetella Parapertussis NOT DETECTED NOT DETECTED Final   Chlamydophila pneumoniae NOT DETECTED NOT DETECTED Final   Mycoplasma pneumoniae NOT DETECTED NOT DETECTED Final    Comment: Performed at [facility], [address], [place], [place] [address]  Culture, blood (Routine X 2) w Reflex to ID Panel     Status: Abnormal   Collection Time: 01/06/24  8:13 AM   Specimen: BLOOD  Result Value Ref Range Status   Specimen Description BLOOD SITE NOT SPECIFIED  Final   Special Requests   Final    BOTTLES DRAWN AEROBIC AND ANAEROBIC Blood Culture results may not be optimal due to an inadequate volume of blood received in  culture bottles   Culture  Setup Time   Final    GRAM NEGATIVE RODS BOTTLES DRAWN AEROBIC ONLY CRITICAL RESULT CALLED TO, READ BACK BY AND VERIFIED WITH: V BRYK,PHARMD@[id number]  01/09/24 MK    Culture (A)  Final    BURKHOLDERIA SPECIES BURKHOLDERIA CEPACIA GROUP Performed at [facility], [address], [place], [place] [address]    Report Status 01/10/2024 FINAL  Final   Organism ID, Bacteria BURKHOLDERIA SPECIES  Final      Susceptibility   Burkholderia species - MIC*    CEFEPIME >=32 RESISTANT Resistant     GENTAMICIN <=1 SENSITIVE Sensitive     CIPROFLOXACIN  <=0.25 SENSITIVE Sensitive     IMIPENEM 2 SENSITIVE Sensitive     TRIMETH /SULFA  <=20 SENSITIVE Sensitive     * BURKHOLDERIA SPECIES  Culture, blood (Routine X 2) w Reflex to ID Panel     Status: Abnormal   Collection Time: 01/06/24  8:13 AM   Specimen: BLOOD  Result Value Ref Range Status   Specimen Description BLOOD SITE NOT SPECIFIED  Final   Special Requests   Final    BOTTLES DRAWN AEROBIC AND ANAEROBIC Blood Culture results may not be optimal due to an inadequate volume of blood received in culture bottles   Culture  Setup Time   Final    GRAM NEGATIVE RODS ANAEROBIC BOTTLE ONLY CRITICAL VALUE NOTED.  VALUE IS CONSISTENT WITH PREVIOUSLY REPORTED AND CALLED VALUE.    Culture (A)  Final    BURKHOLDERIA SPECIES SUSCEPTIBILITIES PERFORMED ON PREVIOUS CULTURE WITHIN THE LAST 5 DAYS. Performed at [facility], [address] [place], [place] [address]    Report Status 01/11/2024 FINAL  Final  Blood Culture ID Panel (Reflexed)     Status: None   Collection Time: 01/06/24  8:13 AM  Result Value Ref Range Status   Enterococcus faecalis NOT DETECTED NOT DETECTED Final   Enterococcus Faecium NOT DETECTED NOT DETECTED Final   Listeria monocytogenes NOT DETECTED NOT DETECTED Final   Staphylococcus species NOT DETECTED NOT DETECTED Final   Staphylococcus aureus (BCID) NOT DETECTED NOT DETECTED Final    Staphylococcus epidermidis NOT DETECTED NOT DETECTED Final   Staphylococcus lugdunensis NOT DETECTED NOT DETECTED Final   Streptococcus species NOT DETECTED NOT DETECTED Final   Streptococcus agalactiae NOT DETECTED NOT DETECTED Final   Streptococcus pneumoniae NOT DETECTED NOT DETECTED Final   Streptococcus pyogenes NOT DETECTED NOT DETECTED Final   A.calcoaceticus-baumannii NOT DETECTED NOT DETECTED Final   Bacteroides fragilis NOT DETECTED NOT DETECTED  Final   Enterobacterales NOT DETECTED NOT DETECTED Final   Enterobacter cloacae complex NOT DETECTED NOT DETECTED Final   Escherichia coli NOT DETECTED NOT DETECTED Final   Klebsiella aerogenes NOT DETECTED NOT DETECTED Final   Klebsiella oxytoca NOT DETECTED NOT DETECTED Final   Klebsiella pneumoniae NOT DETECTED NOT DETECTED Final   Proteus species NOT DETECTED NOT DETECTED Final   Salmonella species NOT DETECTED NOT DETECTED Final   Serratia marcescens NOT DETECTED NOT DETECTED Final   Haemophilus influenzae NOT DETECTED NOT DETECTED Final   Neisseria meningitidis NOT DETECTED NOT DETECTED Final   Pseudomonas aeruginosa NOT DETECTED NOT DETECTED Final   Stenotrophomonas maltophilia NOT DETECTED NOT DETECTED Final   Candida albicans NOT DETECTED NOT DETECTED Final   Candida auris NOT DETECTED NOT DETECTED Final   Candida glabrata NOT DETECTED NOT DETECTED Final   Candida krusei NOT DETECTED NOT DETECTED Final   Candida parapsilosis NOT DETECTED NOT DETECTED Final   Candida tropicalis NOT DETECTED NOT DETECTED Final   Cryptococcus neoformans/gattii NOT DETECTED NOT DETECTED Final    Comment: Performed at [facility], [address], [place], [place] [address]  Culture, blood (Routine X 2) w Reflex to ID Panel     Status: Abnormal   Collection Time: 01/06/24  8:22 PM   Specimen: BLOOD  Result Value Ref Range Status   Specimen Description BLOOD SITE NOT SPECIFIED  Final   Special Requests   Final    BOTTLES DRAWN AEROBIC AND  ANAEROBIC Blood Culture adequate volume   Culture  Setup Time   Final    GRAM NEGATIVE RODS AEROBIC BOTTLE ONLY CRITICAL VALUE NOTED.  VALUE IS CONSISTENT WITH PREVIOUSLY REPORTED AND CALLED VALUE.    Culture (A)  Final    BURKHOLDERIA SPECIES SUSCEPTIBILITIES PERFORMED ON PREVIOUS CULTURE WITHIN THE LAST 5 DAYS. Performed at [facility], [address] [place], [place] [address]    Report Status 01/11/2024 FINAL  Final  Culture, blood (Routine X 2) w Reflex to ID Panel     Status: Abnormal   Collection Time: 01/06/24  8:22 PM   Specimen: BLOOD  Result Value Ref Range Status   Specimen Description BLOOD SITE NOT SPECIFIED  Final   Special Requests   Final    BOTTLES DRAWN AEROBIC AND ANAEROBIC Blood Culture adequate volume   Culture  Setup Time   Final    GRAM NEGATIVE RODS AEROBIC BOTTLE ONLY CRITICAL VALUE NOTED.  VALUE IS CONSISTENT WITH PREVIOUSLY REPORTED AND CALLED VALUE.    Culture (A)  Final    BURKHOLDERIA SPECIES SUSCEPTIBILITIES PERFORMED ON PREVIOUS CULTURE WITHIN THE LAST 5 DAYS. Performed at [facility], [address], [place], [place] [address]    Report Status 01/11/2024 FINAL  Final  Culture, blood (Routine X 2) w Reflex to ID Panel     Status: None (Preliminary result)   Collection Time: 01/09/24  2:27 PM   Specimen: BLOOD  Result Value Ref Range Status   Specimen Description BLOOD SITE NOT SPECIFIED  Final   Special Requests   Final    BOTTLES DRAWN AEROBIC AND ANAEROBIC Blood Culture adequate volume   Culture   Final    NO GROWTH 3 DAYS Performed at [facility], [address] [place], [place] [address]    Report Status PENDING  Incomplete  Culture, blood (Routine X 2) w Reflex to ID Panel     Status: None (Preliminary result)   Collection Time:  01/09/24  2:27 PM   Specimen: BLOOD  Result Value Ref Range Status   Specimen Description BLOOD SITE NOT SPECIFIED  Final   Special Requests   Final    BOTTLES DRAWN AEROBIC AND  ANAEROBIC Blood Culture adequate volume   Culture   Final    NO GROWTH 3 DAYS Performed at [facility], [address] [place], [place] [address]    Report Status PENDING  Incomplete      Radiology Studies: No results found.     LOS: 9 days    Aneita Keens, MD [facility] 01/12/2024, 8:36 AM   If 7PM-7AM, please contact night-coverage [email or web address]

## 2024-01-12 NOTE — Plan of Care (Signed)
  Problem: Education: Goal: Knowledge of General Education information will improve Description: Including pain rating scale, medication(s)/side effects and non-pharmacologic comfort measures 01/12/2024 0613 by Rosali Augello, Dennis Fitting, RN Outcome: Progressing 01/12/2024 0613 by Lakia Gritton, Dennis Fitting, RN Outcome: Progressing   Problem: Health Behavior/Discharge Planning: Goal: Ability to manage health-related needs will improve 01/12/2024 0613 by Agata Lucente, Dennis Fitting, RN Outcome: Progressing 01/12/2024 0613 by Nelda Luckey, Dennis Fitting, RN Outcome: Progressing   Problem: Clinical Measurements: Goal: Ability to maintain clinical measurements within normal limits will improve 01/12/2024 0613 by Shaya Altamura, Dennis Fitting, RN Outcome: Progressing 01/12/2024 0613 by Carlie Solorzano, Dennis Fitting, RN Outcome: Progressing Goal: Will remain free from infection 01/12/2024 0613 by Vita Currin, Dennis Fitting, RN Outcome: Progressing 01/12/2024 0613 by Joana Nolton, Dennis Fitting, RN Outcome: Progressing Goal: Diagnostic test results will improve 01/12/2024 0613 by Quinnie Barcelo, Dennis Fitting, RN Outcome: Progressing 01/12/2024 0613 by Nat Lowenthal, Dennis Fitting, RN Outcome: Progressing Goal: Respiratory complications will improve 01/12/2024 0613 by Melesa Lecy, Dennis Fitting, RN Outcome: Progressing 01/12/2024 0613 by Lylee Corrow, Dennis Fitting, RN Outcome: Progressing Goal: Cardiovascular complication will be avoided 01/12/2024 1914 by Nimesh Riolo, Dennis Fitting, RN Outcome: Progressing 01/12/2024 0613 by Sakoya Win, Dennis Fitting, RN Outcome: Progressing   Problem: Activity: Goal: Risk for activity intolerance will decrease 01/12/2024 0613 by Stepfon Rawles, Dennis Fitting, RN Outcome: Progressing 01/12/2024 [phone] by Rumor Sun, Dennis Fitting, RN Outcome: Progressing   Problem: Nutrition: Goal: Adequate nutrition will be maintained 01/12/2024 0613 by Eshani Maestre, Dennis Fitting, RN Outcome: Progressing 01/12/2024 0613 by Melinna Linarez, Dennis Fitting, RN Outcome: Progressing   Problem: Coping: Goal: Level of anxiety will decrease 01/12/2024 0613 by Salam Micucci,  Dennis Fitting, RN Outcome: Progressing 01/12/2024 0613 by Phyliss Hulick, Dennis Fitting, RN Outcome: Progressing   Problem: Elimination: Goal: Will not experience complications related to bowel motility 01/12/2024 0613 by Danahi Reddish, Dennis Fitting, RN Outcome: Progressing 01/12/2024 0613 by Lamerle Jabs, Dennis Fitting, RN Outcome: Progressing Goal: Will not experience complications related to urinary retention 01/12/2024 0613 by Zeki Bedrosian, Dennis Fitting, RN Outcome: Progressing 01/12/2024 0613 by Evah Rashid, Dennis Fitting, RN Outcome: Progressing   Problem: Pain Managment: Goal: General experience of comfort will improve and/or be controlled 01/12/2024 0613 by Jaira Canady, Dennis Fitting, RN Outcome: Progressing 01/12/2024 0613 by Cate Oravec, Dennis Fitting, RN Outcome: Progressing   Problem: Safety: Goal: Ability to remain free from injury will improve 01/12/2024 0613 by Tanja Gift, Dennis Fitting, RN Outcome: Progressing 01/12/2024 0613 by Izel Hochberg, Dennis Fitting, RN Outcome: Progressing   Problem: Skin Integrity: Goal: Risk for impaired skin integrity will decrease 01/12/2024 0613 by Kierria Feigenbaum, Dennis Fitting, RN Outcome: Progressing 01/12/2024 0613 by Gari Trovato, Dennis Fitting, RN Outcome: Progressing

## 2024-01-12 NOTE — Progress Notes (Signed)
 Subjective/Chief Complaint: Pt reports more pain this AM. She is picking at pancake for breakfast and reports not feeling as hungry. Denies nausea or vomiting though. Passing flatus but no BM.    Objective: Vital signs in last 24 hours: Temp:  [97.5 F (36.4 C)-101.1 F (38.4 C)] 97.5 F (36.4 C) (05/25 0425) Pulse Rate:  [70-78] 74 (05/25 0425) Resp:  [18-20] 18 (05/25 0425) BP: (110-131)/(63-73) 126/73 (05/25 0425) SpO2:  [89 %-100 %] 92 % (05/25 0425) Weight:  [77.3 kg] 77.3 kg (05/25 0425) Last BM Date : 01/09/24  Intake/Output from previous day: 05/24 0701 - 05/25 0700 In: 596 [P.O.:596] Out: -  Intake/Output this shift: No intake/output data recorded.  Exam: Awake and alert Comfortable Abdomen soft, dressing clean and dry, staples intact, appropriately tender, non-distended.   Lab Results:  Recent Labs    01/11/24 0925 01/12/24 0730  WBC 6.2 10.0  HGB 7.8* 8.3*  HCT 24.2* 26.0*  PLT 175 234   BMET Recent Labs    01/11/24 0925 01/12/24 0730  NA 134* 135  K 3.6 3.7  CL 96* 100  CO2 27 25  GLUCOSE 158* 86  BUN 20 31*  CREATININE 4.33* 5.64*  CALCIUM  8.3* 8.4*   PT/INR No results for input(s): "LABPROT", "INR" in the last 72 hours.  ABG No results for input(s): "PHART", "HCO3" in the last 72 hours.  Invalid input(s): "PCO2", "PO2"  Studies/Results: No results found.   Anti-infectives: Anti-infectives (From admission, onward)    Start     Dose/Rate Route Frequency Ordered Stop   01/10/24 2200  ciprofloxacin  (CIPRO ) tablet 500 mg        500 mg Oral Every 24 hours 01/10/24 1439     01/10/24 2200  metroNIDAZOLE  (FLAGYL ) tablet 500 mg        500 mg Oral Every 12 hours 01/10/24 1439     01/10/24 0845  meropenem (MERREM) 500 mg in sodium chloride  0.9 % 100 mL IVPB        500 mg 200 mL/hr over 30 Minutes Intravenous STAT 01/10/24 0830 01/10/24 0910   01/08/24 0810  vancomycin (VANCOCIN) IVPB 1000 mg/200 mL premix        over 60 Minutes  Intravenous Continuous PRN 01/08/24 0810 01/08/24 0843   01/08/24 0000  vancomycin (VANCOCIN) IVPB 1000 mg/200 mL premix        1,000 mg 200 mL/hr over 60 Minutes Intravenous  Once 01/07/24 0922 01/08/24 0024   01/07/24 1015  metroNIDAZOLE  (FLAGYL ) tablet 500 mg  Status:  Discontinued        500 mg Oral Every 12 hours 01/07/24 0922 01/10/24 0830   01/05/24 1430  metroNIDAZOLE  (FLAGYL ) IVPB 500 mg  Status:  Discontinued        500 mg 100 mL/hr over 60 Minutes Intravenous Every 12 hours 01/05/24 1330 01/07/24 0922   01/04/24 2030  micafungin  (MYCAMINE ) 100 mg in sodium chloride  0.9 % 100 mL IVPB        100 mg 105 mL/hr over 1 Hours Intravenous Every 24 hours 01/04/24 1940     01/04/24 1000  cefTRIAXone  (ROCEPHIN ) 2 g in sodium chloride  0.9 % 100 mL IVPB  Status:  Discontinued        2 g 200 mL/hr over 30 Minutes Intravenous Every 24 hours 01/03/24 2055 01/10/24 0830   01/03/24 1615  cefTRIAXone  (ROCEPHIN ) 1 g in sodium chloride  0.9 % 100 mL IVPB        1 g 200 mL/hr over 30  Minutes Intravenous  Once 01/03/24 1601 01/03/24 1759   01/03/24 1615  azithromycin  (ZITHROMAX ) 500 mg in sodium chloride  0.9 % 250 mL IVPB        500 mg 250 mL/hr over 60 Minutes Intravenous  Once 01/03/24 1601 01/03/24 1915       Assessment/Plan: Cholelithiasis 63 y/o F admitted with fever, candida bacteremia with imaging studies concerning for inflammatory changes around the gallbladder now s/p lap converted to open chole 5/23  - hgb 8.3 this AM - Continue regular diet - adjusted PO oxy to q4h prn for better pain control  - Follow up pathology  - OOB and ambulate in the hall  - Staples to be removed in 4-6 weeks pending clinical course - Remainder of care per primary team   Annetta Killian, [facility] 01/12/2024, 9:44 AM Please see Amion for pager number during day hours 7:00am-4:30pm

## 2024-01-12 NOTE — Progress Notes (Addendum)
 [facility] Progress Note   Subjective:    Seen and examined patient at bedside. Noted she was c/o pain earlier this morning. Sugery following and pain medications were adjusted. S/p lap chole by Dr. Davonna Estes 5/23. Next HD 5/26.  Objective Vitals:   01/11/24 1954 01/12/24 0039 01/12/24 0425 01/12/24 1322  BP: 131/65 110/70 126/73 115/84  Pulse: 78 75 74 71  Resp: 20 18 18 18   Temp: 98.5 F (36.9 C) 98.1 F (36.7 C) (!) 97.5 F (36.4 C) 98.2 F (36.8 C)  TempSrc: Oral Oral  Oral  SpO2: (!) 89% 100% 92% 100%  Weight:   77.3 kg   Height:       Physical Exam General: Alert female in NAD Heart: RRR, no murmrus, rubs or gallops Lungs: Clear anteriorly, respirations unlabored, on RA Abdomen: Soft, non-distended, +BS, mid-abd incision, staples CDI Extremities: No edema b/l lower extremities Dialysis Access:  TDC removed 01/06/24, AVF + t/b but somewhat small, needs 2nd stage surgery for AVF. S/p [facility]  01/08/24.   Filed Weights   01/10/24 0712 01/11/24 0412 01/12/24 0425  Weight: 67.1 kg 77.3 kg 77.3 kg    Intake/Output Summary (Last 24 hours) at 01/12/2024 1406 Last data filed at 01/12/2024 0744 Gross per 24 hour  Intake 360 ml  Output --  Net 360 ml    Additional Objective Labs: Basic Metabolic Panel: Recent Labs  Lab 01/10/24 1458 01/10/24 1459 01/11/24 0824 01/11/24 0925 01/12/24 0730  NA 133*   < > 135 134* 135  K 3.9   < > 3.7 3.6 3.7  CL 97*   < > 98 96* 100  CO2 20*   < > 23 27 25   GLUCOSE 161*   < > 116* 158* 86  BUN 32*   < > 21 20 31*  CREATININE 5.87*   < > 4.09* 4.33* 5.64*  CALCIUM  8.8*   < > 8.5* 8.3* 8.4*  PHOS 4.5  --  4.1  --  5.4*   < > = values in this interval not displayed.   Liver Function Tests: Recent Labs  Lab 01/05/24 1427 01/09/24 0739 01/10/24 1459 01/11/24 0824 01/11/24 0925 01/12/24 0730  AST 38  --  51*  --  36  --   ALT 29  --  14  --  14  --   ALKPHOS 128*  --  119  --  93  --   BILITOT 0.6  --  0.9  --   0.8  --   PROT 6.9  --  7.1  --  6.4*  --   ALBUMIN  2.8*   < > 2.7* 2.5* 2.4* 2.5*   < > = values in this interval not displayed.   No results for input(s): "LIPASE", "AMYLASE" in the last 168 hours. CBC: Recent Labs  Lab 01/07/24 0705 01/08/24 0614 01/09/24 0739 01/10/24 0724 01/11/24 0925 01/12/24 0730  WBC 7.6 6.4 5.5  --  6.2 10.0  HGB 8.5* 8.9* 9.1* 9.9* 7.8* 8.3*  HCT 25.7* 27.3* 27.6* 29.0* 24.2* 26.0*  MCV 88.9 89.2 88.7  --  89.6 91.9  PLT 119* 124* 136*  --  175 234   Blood Culture    Component Value Date/Time   SDES BLOOD SITE NOT SPECIFIED 01/09/2024 1427   SDES BLOOD SITE NOT SPECIFIED 01/09/2024 1427   SPECREQUEST  01/09/2024 1427    BOTTLES DRAWN AEROBIC AND ANAEROBIC Blood Culture adequate volume   SPECREQUEST  01/09/2024 1427    BOTTLES  DRAWN AEROBIC AND ANAEROBIC Blood Culture adequate volume   CULT  01/09/2024 1427    NO GROWTH 3 DAYS Performed at [facility], [address], [place], [place] [address]    CULT  01/09/2024 1427    NO GROWTH 3 DAYS Performed at [facility], [address] [place], [place] [address]    REPTSTATUS PENDING 01/09/2024 1427   REPTSTATUS PENDING 01/09/2024 1427    Cardiac Enzymes: No results for input(s): "CKTOTAL", "CKMB", "CKMBINDEX", "TROPONINI" in the last 168 hours. CBG: Recent Labs  Lab 01/10/24 0552 01/10/24 0717 01/10/24 1032  GLUCAP 75 81 136*   Iron  Studies: No results for input(s): "IRON ", "TIBC", "TRANSFERRIN", "FERRITIN" in the last 72 hours. Lab Results  Component Value Date   INR 1.1 01/09/2024   INR 1.0 08/11/2023   INR >10.0 (HH) 08/11/2023   Studies/Results: No results found.  Medications:  micafungin  (MYCAMINE ) 100 mg in sodium chloride  0.9 % 100 mL IVPB 100 mg (01/11/24 2243)    (feeding supplement) PROSource Plus  30 mL Oral BID BM   amLODipine   10 mg Oral Daily   aspirin  EC  81 mg Oral Daily   atorvastatin   80 mg Oral Daily   Chlorhexidine  Gluconate Cloth  6 each  Topical Q0600   ciprofloxacin   500 mg Oral Q24H   [START ON 01/15/2024] darbepoetin (ARANESP ) injection - DIALYSIS  100 mcg Subcutaneous Q Wed-1800   gabapentin   100 mg Oral BID   heparin   5,000 Units Subcutaneous Q8H   metroNIDAZOLE   500 mg Oral Q12H   pantoprazole   40 mg Oral QHS   polyethylene glycol  17 g Oral Daily   senna-docusate  1 tablet Oral BID    Dialysis Orders: [facility] 4h   B500   70.5kg    TDC    Heparin  2000 Has been coming off 2-4kg over last 2 wks Had 30 min Rx on 5/16   Assessment/Plan: Fever / leukopenia: blood cultures showing candidemia. ID on board. Appears line holiday was completed. S/p new [facility] 5/21. No fevers for past two days. WBC trending down on IV antibiotics. Noted repeat blood cultures from 5/19 (+) gram negative rods. ABXs were adjusted. Blood cultures 5/22 NGTD. 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 On Flagyl  500 mg q 12 hours and Mycamine  100 mg q 24 hours.  ESRD: on HD MWF.  Next HD 01/13/24 HTN: ARB was added yesterday for controlled HTN, cont norvasc  and she has PRN hydralazine . UF will help with her BP.  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 Noted dose was already raised-next dose on 5/28.  Holding IV Fe with infection  Possible cholecystitis: CT of the chest/abdomen/pelvis shows atypical inflammatory changes of her lungs as well as gallstones with trace pericholecystic fluid. RUQ U/S shows stones, wall thickening, and pericholecystic fluid. S/p lap chole 5/23. Both of this studies also show a nodular liver contour concerning for possible cirrhosis  Secondary hyperparathyroidism: CCa 9.6, phos 5.4.  Nutrition: albumin  2.8 on renal diet. Continue protein supplements.   Jadene Maxwell, NP [facility] 01/12/2024,2:06 PM  LOS: 9 days      Seen and examined independently.  Agree with note and exam as documented above by physician extender and as noted  here.  General adult female in bed in no acute distress HEENT normocephalic atraumatic extraocular movements intact sclera anicteric Neck supple trachea midline Lungs clear to auscultation bilaterally normal work of  breathing at rest on room air, lying flat Heart S1S2 no rub Abdomen soft nontender nondistended Extremities no pitting edema  Psych normal mood and affect Neuro - alert and oriented x 3 provides hx and follows commands  RIJ tunn catheter; LUE AVF with bruit and thrill   Fungemia - blood cultures 5/22 NGTD.  Now with a new tunneled catheter.  Cultures from 5/19 positive with gram negative rods.    ESRD - HD per MWF schedule   HTN - stop losartan. Will optimize volume status with HD.  She is not on oxygen at home.    Anemia of CKD - on ESA   Nan Aver, MD 01/12/2024  3:39 PM

## 2024-01-13 DIAGNOSIS — B377 Candidal sepsis: Secondary | ICD-10-CM | POA: Diagnosis not present

## 2024-01-13 DIAGNOSIS — R6520 Severe sepsis without septic shock: Secondary | ICD-10-CM | POA: Diagnosis not present

## 2024-01-13 LAB — CBC
HCT: 26.1 % — ABNORMAL LOW (ref 36.0–46.0)
Hemoglobin: 8.4 g/dL — ABNORMAL LOW (ref 12.0–15.0)
MCH: 29.5 pg (ref 26.0–34.0)
MCHC: 32.2 g/dL (ref 30.0–36.0)
MCV: 91.6 fL (ref 80.0–100.0)
Platelets: 246 10*3/uL (ref 150–400)
RBC: 2.85 MIL/uL — ABNORMAL LOW (ref 3.87–5.11)
RDW: 17 % — ABNORMAL HIGH (ref 11.5–15.5)
WBC: 11.6 10*3/uL — ABNORMAL HIGH (ref 4.0–10.5)
nRBC: 0 % (ref 0.0–0.2)

## 2024-01-13 LAB — RENAL FUNCTION PANEL
Albumin: 2.5 g/dL — ABNORMAL LOW (ref 3.5–5.0)
Anion gap: 11 (ref 5–15)
BUN: 40 mg/dL — ABNORMAL HIGH (ref 8–23)
CO2: 23 mmol/L (ref 22–32)
Calcium: 8.2 mg/dL — ABNORMAL LOW (ref 8.9–10.3)
Chloride: 99 mmol/L (ref 98–111)
Creatinine, Ser: 6.95 mg/dL — ABNORMAL HIGH (ref 0.44–1.00)
GFR, Estimated: 6 mL/min — ABNORMAL LOW (ref >60)
Glucose, Bld: 79 mg/dL (ref 70–99)
Phosphorus: 6.6 mg/dL — ABNORMAL HIGH (ref 2.5–4.6)
Potassium: 4 mmol/L (ref 3.5–5.1)
Sodium: 133 mmol/L — ABNORMAL LOW (ref 135–145)

## 2024-01-13 MED ORDER — OXYCODONE HCL 5 MG PO TABS
ORAL_TABLET | ORAL | Status: AC
  Filled 2024-01-13: qty 2

## 2024-01-13 MED ORDER — FLUCONAZOLE 200 MG PO TABS
200.0000 mg | ORAL_TABLET | Freq: Every day | ORAL | Status: DC
  Administered 2024-01-13 – 2024-01-15 (×3): 200 mg via ORAL
  Filled 2024-01-13 (×5): qty 1

## 2024-01-13 MED ORDER — HEPARIN SODIUM (PORCINE) 1000 UNIT/ML IJ SOLN
INTRAMUSCULAR | Status: AC
  Filled 2024-01-13: qty 4

## 2024-01-13 NOTE — Plan of Care (Signed)

## 2024-01-13 NOTE — Progress Notes (Signed)
  [facility] Progress [facility]  Subjective:  Seen in HD unit No c/o's  Vitals:   01/13/24 1115 01/13/24 1130 01/13/24 1156 01/13/24 1202  BP: 97/63 93/64 107/63 114/62  Pulse: 78 75 78 78  Resp: 14 17 18 16   Temp:   98.8 F (37.1 C) 98.8 F (37.1 C)  TempSrc:      SpO2: 100% 100% 100% 100%  Weight:    72 kg  Height:        Exam: General: Alert female in NAD Heart: RRR, no murmrus, rubs or gallops Lungs: Clear anteriorly, respirations unlabored, on RA Abdomen: Soft, non-distended, +BS, mid-abd incision Extremities: No edema b/l lower extremities Dialysis Access:  old [facility] removed 5/19, new TDC placed 5/21 AVF w/ +bruit (maturing, needs 2nd stage surgery)    OP HD: [facility] 4h   B500   70.5kg    TDC    Heparin  2000 Coming off 2-4kg over last 2 wks   Assessment/ Plan: 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 SP TDC removal 5/19 and replacement 5/21. Per ID.   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 Is now s/p lap chole on 5/23. MRI L-S spine done for pain showed no discitis.  ESRD: on HD MWF. HD today.  HTN: bp's soft since surgery on 5/23. Getting norvasc  now as only BP med (ARB dc'd).  Volume: up 2kg over dry wt today. Follow.  Anemia of esrd: Hb 8-10 here, cont darbe 100 mcg sq weekly while here.  Secondary hyperparathyroidism: CCa and phos in range. Not on binders at this time.   Larry Poag MD  [facility] 01/13/2024, 12:49 PM  Recent Labs  Lab 01/12/24 0730 01/13/24 0547  HGB 8.3* 8.4*  ALBUMIN  2.5* 2.5*  CALCIUM  8.4* 8.2*  PHOS 5.4* 6.6*  CREATININE 5.64* 6.95*  K 3.7 4.0   No results for input(s): "IRON ", "TIBC", "FERRITIN" in the last 168 hours. Inpatient medications:  (feeding supplement) PROSource Plus  30 mL Oral BID BM   amLODipine   10 mg Oral Daily   aspirin  EC  81 mg Oral Daily   atorvastatin   80 mg Oral Daily   Chlorhexidine  Gluconate Cloth  6 each Topical Q0600   ciprofloxacin   500 mg Oral Q24H    [START ON 01/15/2024] darbepoetin (ARANESP ) injection - DIALYSIS  100 mcg Subcutaneous Q Wed-1800   fluconazole  200 mg Oral QHS   gabapentin   100 mg Oral BID   heparin   5,000 Units Subcutaneous Q8H   metroNIDAZOLE   500 mg Oral Q12H   pantoprazole   40 mg Oral QHS   polyethylene glycol  17 g Oral Daily   senna-docusate  1 tablet Oral BID    acetaminophen  **OR** acetaminophen , bisacodyl , diphenhydrAMINE , HYDROmorphone  (DILAUDID ) injection, ipratropium-albuterol , ondansetron  (ZOFRAN ) IV, oxyCODONE 

## 2024-01-13 NOTE — Evaluation (Addendum)
 Occupational Therapy Evaluation Patient Details Name: Carol [name] MRN: [id number] DOB: Feb 14, 1961 Today's Date: 01/13/2024   History of Present Illness   Pt is a 63 y/o F presenting to ED on 5/16 with fevers, shaking, chills, found to have possible cholecystitis, sepsis. S/p laparoscopic converted to open cholecystectomy on 5/23. Fungemia in the setting of HD cath. PMH includes ESRD on HD MWF, HLD, GERD, HTN     Clinical Impressions Pt has assist at baseline with ADLs, uses RW for mobility. Pt lives with daughter an has an aide that comes 4 days/week for 3hrs/day. Pt currently needing up to mod A for ADLs, min A for bed mobility and CGA for transfers with RW. Difficulty with LB ADL due to abdominal incision. Pt desatting to 85% on RA, left pt on 2L O2 satting 93%. RN notified. Pt educated on using pillow to brace abdomen with mobility PRN/coughing/sneezing/etc and pt verbalized understanding. Pt presenting with impairments listed below, will follow acutely. Recommend HHOT at d/c.     If plan is discharge home, recommend the following:   A little help with walking and/or transfers;A little help with bathing/dressing/bathroom;Assistance with cooking/housework;Assist for transportation;Help with stairs or ramp for entrance     Functional Status Assessment   Patient has had a recent decline in their functional status and demonstrates the ability to make significant improvements in function in a reasonable and predictable amount of time.     Equipment Recommendations   None recommended by OT     Recommendations for Other Services   PT consult     Precautions/Restrictions   Precautions Precautions: Fall Restrictions Weight Bearing Restrictions Per Provider Order: No     Mobility Bed Mobility Overal bed mobility: Needs Assistance Bed Mobility: Sidelying to Sit, Sit to Sidelying   Sidelying to sit: Min assist     Sit to sidelying: Min assist       Transfers Overall transfer level: Needs assistance Equipment used: Rolling walker (2 wheels) Transfers: Sit to/from Stand Sit to Stand: Contact guard assist                  Balance Overall balance assessment: Needs assistance Sitting-balance support: Feet supported Sitting balance-Leahy Scale: Good     Standing balance support: During functional activity, Reliant on assistive device for balance Standing balance-Leahy Scale: Fair                             ADL either performed or assessed with clinical judgement   ADL Overall ADL's : Needs assistance/impaired Eating/Feeding: Set up;Sitting   Grooming: Standing;Wash/dry hands   Upper Body Bathing: Minimal assistance;Sitting   Lower Body Bathing: Moderate assistance;Sitting/lateral leans   Upper Body Dressing : Minimal assistance   Lower Body Dressing: Moderate assistance;Sit to/from stand;Sitting/lateral leans   Toilet Transfer: Contact guard assist;Ambulation;Regular Toilet;Rolling walker (2 wheels)   Toileting- Clothing Manipulation and Hygiene: Contact guard assist       Functional mobility during ADLs: Contact guard assist;Rolling walker (2 wheels)       Vision   Vision Assessment?: No apparent visual deficits     Perception Perception: Not tested       Praxis Praxis: Not tested       Pertinent Vitals/Pain Pain Assessment Pain Assessment: Faces Pain Score: 3  Faces Pain Scale: Hurts little more Pain Location: abdomen Pain Descriptors / Indicators: Discomfort Pain Intervention(s): Limited activity within patient's tolerance, Monitored during session, Repositioned  Extremity/Trunk Assessment Upper Extremity Assessment Upper Extremity Assessment: Overall WFL for tasks assessed   Lower Extremity Assessment Lower Extremity Assessment: Defer to PT evaluation   Cervical / Trunk Assessment Cervical / Trunk Assessment: Normal   Communication Communication Communication: No  apparent difficulties   Cognition Arousal: Alert Behavior During Therapy: WFL for tasks assessed/performed Cognition: No apparent impairments                               Following commands: Intact       Cueing  General Comments   Cueing Techniques: Verbal cues  SpO2 down to 85% on RA, left pt on 2L O2 satting with RN aware   Exercises     Shoulder Instructions      Home Living Family/patient expects to be discharged to:: Private residence Living Arrangements: Children Available Help at Discharge: Family;Available PRN/intermittently Type of Home: House Home Access: Level entry     Home Layout: One level     Bathroom Shower/Tub: Chief Strategy Officer: Standard     Home Equipment: Cane - single Librarian, academic (2 wheels);Shower seat;Grab bars - tub/shower   Additional Comments: Aide comes Sun., Tues, Thurs, Sat, 3 hours each day      Prior Functioning/Environment Prior Level of Function : Independent/Modified Independent;Driving             Mobility Comments: uses RW ADLs Comments: aide to assist with ADLs,    OT Problem List: Decreased strength;Decreased range of motion;Decreased activity tolerance;Impaired balance (sitting and/or standing);Decreased safety awareness   OT Treatment/Interventions: Self-care/ADL training;Therapeutic exercise;Energy conservation;DME and/or AE instruction;Therapeutic activities;Patient/family education;Balance training      OT Goals(Current goals can be found in the care plan section)   Acute Rehab OT Goals Patient Stated Goal: none stated OT Goal Formulation: With patient Time For Goal Achievement: 01/27/24 Potential to Achieve Goals: Good ADL Goals Pt Will Perform Upper Body Dressing: Independently;sitting Pt Will Perform Lower Body Dressing: Independently;sitting/lateral leans;sit to/from stand;with adaptive equipment Pt Will Transfer to Toilet: Independently;ambulating;regular height  toilet Pt Will Perform Toileting - Clothing Manipulation and hygiene: Independently;sitting/lateral leans Pt Will Perform Tub/Shower Transfer: Tub transfer;Shower transfer;Independently;ambulating   OT Frequency:  Min 2X/week    Co-evaluation              AM-PAC OT "6 Clicks" Daily Activity     Outcome Measure Help from another person eating meals?: None Help from another person taking care of personal grooming?: A Little Help from another person toileting, which includes using toliet, bedpan, or urinal?: A Little Help from another person bathing (including washing, rinsing, drying)?: A Lot Help from another person to put on and taking off regular upper body clothing?: A Little Help from another person to put on and taking off regular lower body clothing?: A Lot 6 Click Score: 17   End of Session Equipment Utilized During Treatment: Rolling walker (2 wheels);Oxygen Nurse Communication: Mobility status (left pt on O2)  Activity Tolerance: Patient tolerated treatment well Patient left: in bed;with call bell/phone within reach;with bed alarm set  OT Visit Diagnosis: Unsteadiness on feet (R26.81);Other abnormalities of gait and mobility (R26.89);Muscle weakness (generalized) (M62.81)                Time: 1610-9604 OT Time Calculation (min): 34 min Charges:  OT General Charges $OT Visit: 1 Visit OT Evaluation $OT Eval Moderate Complexity: 1 Mod OT Treatments $Self Care/Home Management : 8-22 mins  Shamecka Hocutt K, OTD, OTR/L SecureChat Preferred Acute Rehab [phone]   Antionette Kirks 01/13/2024, 4:36 PM

## 2024-01-13 NOTE — Plan of Care (Signed)
 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 Unclear source, but 7 day tx sufficed     -finish 7 days cholecystitis/burkholderia treatment on 5/29 with cipro /flagyl  -finish 2 weeks candida treatment and can transition micafungin  to fluconazole renal dosing 200 mg po daily; on hd days, give after dialysis Finish fluconazole on 5/31 -check EKG/qtc daily x2 days -repeat bcx 5/22 negative and can keep the new HD line in place -maintain standard isolation precaution -id will sign off -discussed with primary team

## 2024-01-13 NOTE — Progress Notes (Signed)
 Subjective/Chief Complaint: Endorsing some incisional pain but reports it has improved. Tolerating PO. Denies N/V.     Objective: Vital signs in last 24 hours: Temp:  [98.2 F (36.8 C)-99.3 F (37.4 C)] 98.2 F (36.8 C) (05/26 1625) Pulse Rate:  [69-78] 73 (05/26 1625) Resp:  [11-22] 18 (05/26 1625) BP: (93-133)/(56-79) 93/79 (05/26 1625) SpO2:  [98 %-100 %] 100 % (05/26 1625) Weight:  [72 kg-79 kg] 72 kg (05/26 1202) Last BM Date : 01/09/24  Intake/Output from previous day: 05/25 0701 - 05/26 0700 In: 240 [P.O.:240] Out: 200 [Urine:200] Intake/Output this shift: Total I/O In: -  Out: 2500 [Other:2500]  Exam: Awake and alert Comfortable Abdomen soft, dressing clean and dry, staples intact, appropriately tender, non-distended.   Lab Results:  Recent Labs    01/12/24 0730 01/13/24 0547  WBC 10.0 11.6*  HGB 8.3* 8.4*  HCT 26.0* 26.1*  PLT 234 246   BMET Recent Labs    01/12/24 0730 01/13/24 0547  NA 135 133*  K 3.7 4.0  CL 100 99  CO2 25 23  GLUCOSE 86 79  BUN 31* 40*  CREATININE 5.64* 6.95*  CALCIUM  8.4* 8.2*   PT/INR No results for input(s): "LABPROT", "INR" in the last 72 hours.  ABG No results for input(s): "PHART", "HCO3" in the last 72 hours.  Invalid input(s): "PCO2", "PO2"  Studies/Results: No results found.   Anti-infectives: Anti-infectives (From admission, onward)    Start     Dose/Rate Route Frequency Ordered Stop   01/13/24 2200  fluconazole (DIFLUCAN) tablet 200 mg        200 mg Oral Daily at bedtime 01/13/24 1032 01/19/24 2159   01/10/24 2200  ciprofloxacin  (CIPRO ) tablet 500 mg        500 mg Oral Every 24 hours 01/10/24 1439     01/10/24 2200  metroNIDAZOLE  (FLAGYL ) tablet 500 mg        500 mg Oral Every 12 hours 01/10/24 1439     01/10/24 0845  meropenem (MERREM) 500 mg in sodium chloride  0.9 % 100 mL IVPB        500 mg 200 mL/hr over 30 Minutes Intravenous STAT 01/10/24 0830 01/10/24 0910   01/08/24 0810  vancomycin  (VANCOCIN) IVPB 1000 mg/200 mL premix        over 60 Minutes Intravenous Continuous PRN 01/08/24 0810 01/08/24 0843   01/08/24 0000  vancomycin (VANCOCIN) IVPB 1000 mg/200 mL premix        1,000 mg 200 mL/hr over 60 Minutes Intravenous  Once 01/07/24 0922 01/08/24 0024   01/07/24 1015  metroNIDAZOLE  (FLAGYL ) tablet 500 mg  Status:  Discontinued        500 mg Oral Every 12 hours 01/07/24 0922 01/10/24 0830   01/05/24 1430  metroNIDAZOLE  (FLAGYL ) IVPB 500 mg  Status:  Discontinued        500 mg 100 mL/hr over 60 Minutes Intravenous Every 12 hours 01/05/24 1330 01/07/24 0922   01/04/24 2030  micafungin  (MYCAMINE ) 100 mg in sodium chloride  0.9 % 100 mL IVPB  Status:  Discontinued        100 mg 105 mL/hr over 1 Hours Intravenous Every 24 hours 01/04/24 1940 01/13/24 1032   01/04/24 1000  cefTRIAXone  (ROCEPHIN ) 2 g in sodium chloride  0.9 % 100 mL IVPB  Status:  Discontinued        2 g 200 mL/hr over 30 Minutes Intravenous Every 24 hours 01/03/24 2055 01/10/24 0830   01/03/24 1615  cefTRIAXone  (ROCEPHIN ) 1 g  in sodium chloride  0.9 % 100 mL IVPB        1 g 200 mL/hr over 30 Minutes Intravenous  Once 01/03/24 1601 01/03/24 1759   01/03/24 1615  azithromycin  (ZITHROMAX ) 500 mg in sodium chloride  0.9 % 250 mL IVPB        500 mg 250 mL/hr over 60 Minutes Intravenous  Once 01/03/24 1601 01/03/24 1915       Assessment/Plan: Cholelithiasis 63 y/o F admitted with fever, candida bacteremia with imaging studies concerning for inflammatory changes around the gallbladder now s/p lap converted to open chole 5/23  - hgb 8.4 and stable - Continue regular diet - Follow up pathology  - OOB and ambulate in the hall  - Staples to be removed in 4-6 weeks pending clinical course - Remainder of care per primary team   Cannon Champion, MD [facility] 01/13/2024, 5:41 PM Please see Amion for pager number during day hours 7:00am-4:30pm

## 2024-01-13 NOTE — Progress Notes (Signed)
 OT Cancellation Note  Patient Details Name: Carol [name] MRN: [id number] DOB: [birth year]   Cancelled Treatment:    Reason Eval/Treat Not Completed: Patient at procedure or test/ unavailable (at HD)  Carol [name] K, OTD, OTR/L [facility] [phone]   Carol [name] 01/13/2024, 8:39 AM

## 2024-01-13 NOTE — Progress Notes (Signed)
 PROGRESS NOTE    Carol [name]  ZOX:[id number] DOB: 02-08-1961 DOA: 01/03/2024 PCP: [facility]   Brief Narrative: Carol [name] is a 63 y.o. female with a history of ESRD on hemodialysis, hyperlipidemia, GERD, pulm hypertension.  Patient presented secondary to upper chest pain and shortness of breath in addition to fever and chills and found to have evidence of sepsis secondary to candidemia.  Infectious ease consulted.  Hospitalization also complicated by evidence of cholelithiasis s/p laparoscopic, converted to open, cholecystectomy on 5/23.  Repeat blood cultures (5/19) growing GNRs. Antibiotics regimen adjusted per ID recommendations.   Assessment and Plan:  Sepsis Present on admission secondary to candidia tropicalis candidemia.  Fungemia In setting of HD cath; presumed line infection. Noted on blood cultures (5/16 and 5/17). Cultures positive for candida tropicalis. ID consulted during this admission for management. Patient managed on Micafungin . Prior to admission [facility] removed on 5/19 and new TDC placed on 5/21.  Transthoracic Echocardiogram performed on 5/22 significant for no vegetations. Recommendation to complete a 2 week treatment. Transitioned to fluconazole on 5/26; end date of 5/31. -Continue Fluconazole -ID recommendations: EKG daily for at least two days for QTc monitoring while on fluconazole  Burkholderia cepacia group bacteremia Blood cultures (5/19) significant for GNRs. Patient was on Ceftriaxone  prior to cultures. Presumed likely secondary to gallbladder with initial concern for cholecystitis. Ceftriaxone  and flagyl  switched to Ciprofloxacin  and Flagyl  per ID recommendations with further recommendations for a 7-day treatment duration and end date of 5/29.  Cholelithiasis Possible cholecystitis General surgery consulted. Patient underwent laparoscopic, converted to open, cholecystectomy on 5/23. Antibiotics adjusted per ID for GNR  bacteremia.  ESRD on hemodialysis Nephrology consulted for HD during hospitalization. Right internal jugular tunneled HD catheter removed on 5/19 and new right internal jugular tunneled HD catheter placed on 5/21.  Severe asymptomatic hypertension Improved with antihypertensive therapy and hemodialysis. -Continue amlodipine   Anemia of chronic disease Chronic and stable.  Thyroid  nodule Noted incidentally on CT scan imaging. Outpatient follow-up.    DVT prophylaxis: Subcutaneous heparin  Code Status:   Code Status: Full Code Family Communication: None at bedside Disposition Plan: Discharge pending continued ID/general surgery recommendations/management   Consultants:  Nephrology Infectious disease General surgery Interventional radiology  Procedures:  TDC removal TDC placement Hemodialysis Transthoracic Echocardiogram Transesophageal Echocardiogram Laparoscopic, converted to open, cholecystectomy  Antimicrobials: Vancomycin Micafungin  Flagyl  Ceftriaxone  Azithromycin     Subjective: Some left sided abdominal pain. Also some pain around incision.  Objective: BP 114/62   Pulse 78   Temp 98.8 F (37.1 C)   Resp 16   Ht 5\' 4"  (1.626 m)   Wt 72 kg   SpO2 100%   BMI 27.25 kg/m   Examination:  General exam: Appears calm and comfortable Respiratory system: Clear to auscultation. Respiratory effort normal. Cardiovascular system: S1 & S2 heard, RRR. 2/6 systolic murmur. Gastrointestinal system: Abdomen is nondistended, soft and tenderness over incision and mild tenderness of left side. Normal bowel sounds heard. Central nervous system: Alert and oriented. No focal neurological deficits. Psychiatry: Judgement and insight appear normal. Mood & affect appropriate.    Data Reviewed: I have personally reviewed following labs and imaging studies  CBC Lab Results  Component Value Date   WBC 11.6 (H) 01/13/2024   RBC 2.85 (L) 01/13/2024   HGB 8.4 (L) 01/13/2024    HCT 26.1 (L) 01/13/2024   MCV 91.6 01/13/2024   MCH 29.5 01/13/2024   PLT 246 01/13/2024   MCHC 32.2 01/13/2024   RDW 17.0 (  H) 01/13/2024   LYMPHSABS 0.2 (L) 01/03/2024   MONOABS 0.0 (L) 01/03/2024   EOSABS 0.1 01/03/2024   BASOSABS 0.0 01/03/2024     Last metabolic panel Lab Results  Component Value Date   NA 133 (L) 01/13/2024   K 4.0 01/13/2024   CL 99 01/13/2024   CO2 23 01/13/2024   BUN 40 (H) 01/13/2024   CREATININE 6.95 (H) 01/13/2024   GLUCOSE 79 01/13/2024   GFRNONAA 6 (L) 01/13/2024   GFRAA 50 (L) 11/21/2020   CALCIUM  8.2 (L) 01/13/2024   PHOS 6.6 (H) 01/13/2024   PROT 6.4 (L) 01/11/2024   ALBUMIN  2.5 (L) 01/13/2024   LABGLOB 3.0 03/24/2023   AGRATIO 1.0 03/24/2023   BILITOT 0.8 01/11/2024   ALKPHOS 93 01/11/2024   AST 36 01/11/2024   ALT 14 01/11/2024   ANIONGAP 11 01/13/2024    GFR: Estimated Creatinine Clearance: 8.3 mL/min (A) (by C-G formula based on SCr of 6.95 mg/dL (H)).  Recent Results (from the past 240 hours)  Blood culture (routine x 2)     Status: Abnormal   Collection Time: 01/03/24  2:59 PM   Specimen: BLOOD RIGHT FOREARM  Result Value Ref Range Status   Specimen Description BLOOD RIGHT FOREARM  Final   Special Requests   Final    BOTTLES DRAWN AEROBIC AND ANAEROBIC Blood Culture adequate volume   Culture  Setup Time   Final    YEAST IN BOTH AEROBIC AND ANAEROBIC BOTTLES CRITICAL RESULT CALLED TO, READ BACK BY AND VERIFIED WITH: PHARMD AUSTIN PAYTES [id number] 1938 BY J RAZZAK, MT    Culture (A)  Final    CANDIDA TROPICALIS Sent to [facility] for further susceptibility testing. SEE SEPARATE REPORT Performed at [facility], [address], [place], [place] [address]    Report Status 01/10/2024 FINAL  Final  Blood Culture ID Panel (Reflexed)     Status: Abnormal   Collection Time: 01/03/24  2:59 PM  Result Value Ref Range Status   Enterococcus faecalis NOT DETECTED NOT DETECTED Final   Enterococcus Faecium NOT DETECTED NOT  DETECTED Final   Listeria monocytogenes NOT DETECTED NOT DETECTED Final   Staphylococcus species NOT DETECTED NOT DETECTED Final   Staphylococcus aureus (BCID) NOT DETECTED NOT DETECTED Final   Staphylococcus epidermidis NOT DETECTED NOT DETECTED Final   Staphylococcus lugdunensis NOT DETECTED NOT DETECTED Final   Streptococcus species NOT DETECTED NOT DETECTED Final   Streptococcus agalactiae NOT DETECTED NOT DETECTED Final   Streptococcus pneumoniae NOT DETECTED NOT DETECTED Final   Streptococcus pyogenes NOT DETECTED NOT DETECTED Final   A.calcoaceticus-baumannii NOT DETECTED NOT DETECTED Final   Bacteroides fragilis NOT DETECTED NOT DETECTED Final   Enterobacterales NOT DETECTED NOT DETECTED Final   Enterobacter cloacae complex NOT DETECTED NOT DETECTED Final   Escherichia coli NOT DETECTED NOT DETECTED Final   Klebsiella aerogenes NOT DETECTED NOT DETECTED Final   Klebsiella oxytoca NOT DETECTED NOT DETECTED Final   Klebsiella pneumoniae NOT DETECTED NOT DETECTED Final   Proteus species NOT DETECTED NOT DETECTED Final   Salmonella species NOT DETECTED NOT DETECTED Final   Serratia marcescens NOT DETECTED NOT DETECTED Final   Haemophilus influenzae NOT DETECTED NOT DETECTED Final   Neisseria meningitidis NOT DETECTED NOT DETECTED Final   Pseudomonas aeruginosa NOT DETECTED NOT DETECTED Final   Stenotrophomonas maltophilia NOT DETECTED NOT DETECTED Final   Candida albicans NOT DETECTED NOT DETECTED Final   Candida auris NOT DETECTED NOT DETECTED Final   Candida glabrata NOT  DETECTED NOT DETECTED Final   Candida krusei NOT DETECTED NOT DETECTED Final   Candida parapsilosis NOT DETECTED NOT DETECTED Final   Candida tropicalis DETECTED (A) NOT DETECTED Final    Comment: CRITICAL RESULT CALLED TO, READ BACK BY AND VERIFIED WITH: PHARMD AUSTIN PAYTES [id number] 1938 BY J RAZZAK, MT    Cryptococcus neoformans/gattii NOT DETECTED NOT DETECTED Final    Comment: Performed at [facility], [address] [place], [place] [address]  Yeast Susceptibilities     Status: None (Preliminary result)   Collection Time: 01/03/24  2:59 PM  Result Value Ref Range Status   SOURCE PENDING  Incomplete   Organism ID, Yeast Candida tropicalis  Final    Comment: (NOTE) Identification performed by account, not confirmed by this laboratory.    Amphotericin B MIC 1.0 ug/mL  Final    Comment: (NOTE) Breakpoints have been established for only some organism-drug combinations as indicated. This test was developed and its performance characteristics determined by [facility]. It has not been cleared or approved by the Food and Drug Administration.    Anidulafungin MIC Comment  Final    Comment: (NOTE) 0.12 ug/mL Susceptible Breakpoints have been established for only some organism-drug combinations as indicated. This test was developed and its performance characteristics determined by [facility]. It has not been cleared or approved by the Food and Drug Administration.    Caspofungin MIC Comment  Final    Comment: (NOTE) 0.25 ug/mL Susceptible Breakpoints have been established for only some organism-drug combinations as indicated. This test was developed and its performance characteristics determined by [facility]. It has not been cleared or approved by the Food and Drug Administration.    Fluconazole Islt MIC 2.0 ug/mL Susceptible  Final    Comment: (NOTE) Breakpoints have been established for only some organism-drug combinations as indicated. This test was developed and its performance characteristics determined by [facility]. It has not been cleared or approved by the Food and Drug Administration.    ISAVUCONAZOLE MIC 0.12 ug/mL  Final    Comment: (NOTE) This test was developed and its performance characteristics determined by [facility]. It has not been cleared or approved by the Food and Drug Administration.    Itraconazole MIC 0.25 ug/mL  Final    Comment: (NOTE) Breakpoints  have been established for only some organism-drug combinations as indicated. This test was developed and its performance characteristics determined by [facility]. It has not been cleared or approved by the Food and Drug Administration.    Micafungin  MIC Comment  Final    Comment: (NOTE) 0.06 ug/mL Susceptible Breakpoints have been established for only some organism-drug combinations as indicated. This test was developed and its performance characteristics determined by [facility]. It has not been cleared or approved by the Food and Drug Administration.    Posaconazole MIC 1.0 ug/mL  Final    Comment: (NOTE) Breakpoints have been established for only some organism-drug combinations as indicated. This test was developed and its performance characteristics determined by [facility]. It has not been cleared or approved by the Food and Drug Administration.    REZAFUNGIN MIC Comment  Final    Comment: (NOTE) 0.25 ug/mL Susceptible This test was developed and its performance characteristics determined by [facility]. It has not been cleared or approved by the Food and Drug Administration.    Voriconazole MIC Comment  Final    Comment: (NOTE) 0.5 ug/mL Intermediate Breakpoints have been established for only some organism-drug combinations as indicated. This test was developed and its  performance characteristics determined by [facility]. It has not been cleared or approved by the Food and Drug Administration. Performed At: [facility] [address] [place], [place] [id number] Pearlean Botts MD IO:[id number]   Blood culture (routine x 2)     Status: Abnormal   Collection Time: 01/04/24  5:39 AM   Specimen: BLOOD RIGHT ARM  Result Value Ref Range Status   Specimen Description BLOOD RIGHT ARM  Final   Special Requests   Final    BOTTLES DRAWN AEROBIC AND ANAEROBIC Blood Culture adequate volume   Culture  Setup Time   Final    BUDDING YEAST SEEN AEROBIC BOTTLE ONLY Gram Stain Report  Called to,Read Back By and Verified With: PHARMD J LEDFORD 01/06/2024 @ 0059 BY AB Performed at [facility], [address] [place], [place] [address]    Culture CANDIDA TROPICALIS (A)  Final   Report Status 01/07/2024 FINAL  Final  Respiratory (~20 pathogens) panel by PCR     Status: None   Collection Time: 01/04/24 11:35 PM   Specimen: Nasopharyngeal Swab; Respiratory  Result Value Ref Range Status   Adenovirus NOT DETECTED NOT DETECTED Final   Coronavirus 229E NOT DETECTED NOT DETECTED Final    Comment: (NOTE) The Coronavirus on the Respiratory Panel, DOES NOT test for the novel  Coronavirus (2019 nCoV)    Coronavirus HKU1 NOT DETECTED NOT DETECTED Final   Coronavirus NL63 NOT DETECTED NOT DETECTED Final   Coronavirus OC43 NOT DETECTED NOT DETECTED Final   Metapneumovirus NOT DETECTED NOT DETECTED Final   Rhinovirus / Enterovirus NOT DETECTED NOT DETECTED Final   Influenza A NOT DETECTED NOT DETECTED Final   Influenza B NOT DETECTED NOT DETECTED Final   Parainfluenza Virus 1 NOT DETECTED NOT DETECTED Final   Parainfluenza Virus 2 NOT DETECTED NOT DETECTED Final   Parainfluenza Virus 3 NOT DETECTED NOT DETECTED Final   Parainfluenza Virus 4 NOT DETECTED NOT DETECTED Final   Respiratory Syncytial Virus NOT DETECTED NOT DETECTED Final   Bordetella pertussis NOT DETECTED NOT DETECTED Final   Bordetella Parapertussis NOT DETECTED NOT DETECTED Final   Chlamydophila pneumoniae NOT DETECTED NOT DETECTED Final   Mycoplasma pneumoniae NOT DETECTED NOT DETECTED Final    Comment: Performed at [facility], [address], [place], [place] [address]  Culture, blood (Routine X 2) w Reflex to ID Panel     Status: Abnormal   Collection Time: 01/06/24  8:13 AM   Specimen: BLOOD  Result Value Ref Range Status   Specimen Description BLOOD SITE NOT SPECIFIED  Final   Special Requests   Final    BOTTLES DRAWN AEROBIC AND ANAEROBIC Blood Culture results may not be optimal due to an  inadequate volume of blood received in culture bottles   Culture  Setup Time   Final    GRAM NEGATIVE RODS BOTTLES DRAWN AEROBIC ONLY CRITICAL RESULT CALLED TO, READ BACK BY AND VERIFIED WITH: V BRYK,PHARMD@[email or web address]  01/09/24 MK    Culture (A)  Final    BURKHOLDERIA SPECIES BURKHOLDERIA CEPACIA GROUP Performed at [facility], [address], [place], [place] [address]    Report Status 01/10/2024 FINAL  Final   Organism ID, Bacteria BURKHOLDERIA SPECIES  Final      Susceptibility   Burkholderia species - MIC*    CEFEPIME >=32 RESISTANT Resistant     GENTAMICIN <=1 SENSITIVE Sensitive     CIPROFLOXACIN  <=0.25 SENSITIVE Sensitive     IMIPENEM 2 SENSITIVE Sensitive  TRIMETH/SULFA <=20 SENSITIVE Sensitive     * BURKHOLDERIA SPECIES  Culture, blood (Routine X 2) w Reflex to ID Panel     Status: Abnormal   Collection Time: 01/06/24  8:13 AM   Specimen: BLOOD  Result Value Ref Range Status   Specimen Description BLOOD SITE NOT SPECIFIED  Final   Special Requests   Final    BOTTLES DRAWN AEROBIC AND ANAEROBIC Blood Culture results may not be optimal due to an inadequate volume of blood received in culture bottles   Culture  Setup Time   Final    GRAM NEGATIVE RODS ANAEROBIC BOTTLE ONLY CRITICAL VALUE NOTED.  VALUE IS CONSISTENT WITH PREVIOUSLY REPORTED AND CALLED VALUE.    Culture (A)  Final    BURKHOLDERIA SPECIES SUSCEPTIBILITIES PERFORMED ON PREVIOUS CULTURE WITHIN THE LAST 5 DAYS. Performed at [facility], [address], [place], [place] [address]    Report Status 01/11/2024 FINAL  Final  Blood Culture ID Panel (Reflexed)     Status: None   Collection Time: 01/06/24  8:13 AM  Result Value Ref Range Status   Enterococcus faecalis NOT DETECTED NOT DETECTED Final   Enterococcus Faecium NOT DETECTED NOT DETECTED Final   Listeria monocytogenes NOT DETECTED NOT DETECTED Final   Staphylococcus species NOT DETECTED NOT DETECTED Final   Staphylococcus aureus (BCID) NOT  DETECTED NOT DETECTED Final   Staphylococcus epidermidis NOT DETECTED NOT DETECTED Final   Staphylococcus lugdunensis NOT DETECTED NOT DETECTED Final   Streptococcus species NOT DETECTED NOT DETECTED Final   Streptococcus agalactiae NOT DETECTED NOT DETECTED Final   Streptococcus pneumoniae NOT DETECTED NOT DETECTED Final   Streptococcus pyogenes NOT DETECTED NOT DETECTED Final   A.calcoaceticus-baumannii NOT DETECTED NOT DETECTED Final   Bacteroides fragilis NOT DETECTED NOT DETECTED Final   Enterobacterales NOT DETECTED NOT DETECTED Final   Enterobacter cloacae complex NOT DETECTED NOT DETECTED Final   Escherichia coli NOT DETECTED NOT DETECTED Final   Klebsiella aerogenes NOT DETECTED NOT DETECTED Final   Klebsiella oxytoca NOT DETECTED NOT DETECTED Final   Klebsiella pneumoniae NOT DETECTED NOT DETECTED Final   Proteus species NOT DETECTED NOT DETECTED Final   Salmonella species NOT DETECTED NOT DETECTED Final   Serratia marcescens NOT DETECTED NOT DETECTED Final   Haemophilus influenzae NOT DETECTED NOT DETECTED Final   Neisseria meningitidis NOT DETECTED NOT DETECTED Final   Pseudomonas aeruginosa NOT DETECTED NOT DETECTED Final   Stenotrophomonas maltophilia NOT DETECTED NOT DETECTED Final   Candida albicans NOT DETECTED NOT DETECTED Final   Candida auris NOT DETECTED NOT DETECTED Final   Candida glabrata NOT DETECTED NOT DETECTED Final   Candida krusei NOT DETECTED NOT DETECTED Final   Candida parapsilosis NOT DETECTED NOT DETECTED Final   Candida tropicalis NOT DETECTED NOT DETECTED Final   Cryptococcus neoformans/gattii NOT DETECTED NOT DETECTED Final    Comment: Performed at [facility], [address], [place], [place] [address]  Culture, blood (Routine X 2) w Reflex to ID Panel     Status: Abnormal   Collection Time: 01/06/24  8:22 PM   Specimen: BLOOD  Result Value Ref Range Status   Specimen Description BLOOD SITE NOT SPECIFIED  Final   Special Requests    Final    BOTTLES DRAWN AEROBIC AND ANAEROBIC Blood Culture adequate volume   Culture  Setup Time   Final    GRAM NEGATIVE RODS AEROBIC BOTTLE ONLY CRITICAL VALUE NOTED.  VALUE IS CONSISTENT WITH PREVIOUSLY REPORTED AND CALLED  VALUE.    Culture (A)  Final    BURKHOLDERIA SPECIES SUSCEPTIBILITIES PERFORMED ON PREVIOUS CULTURE WITHIN THE LAST 5 DAYS. Performed at [facility], [address] [place], [place] [address]    Report Status 01/11/2024 FINAL  Final  Culture, blood (Routine X 2) w Reflex to ID Panel     Status: Abnormal   Collection Time: 01/06/24  8:22 PM   Specimen: BLOOD  Result Value Ref Range Status   Specimen Description BLOOD SITE NOT SPECIFIED  Final   Special Requests   Final    BOTTLES DRAWN AEROBIC AND ANAEROBIC Blood Culture adequate volume   Culture  Setup Time   Final    GRAM NEGATIVE RODS AEROBIC BOTTLE ONLY CRITICAL VALUE NOTED.  VALUE IS CONSISTENT WITH PREVIOUSLY REPORTED AND CALLED VALUE.    Culture (A)  Final    BURKHOLDERIA SPECIES SUSCEPTIBILITIES PERFORMED ON PREVIOUS CULTURE WITHIN THE LAST 5 DAYS. Performed at [facility], [address] [place], [place] [address]    Report Status 01/11/2024 FINAL  Final  Culture, blood (Routine X 2) w Reflex to ID Panel     Status: None (Preliminary result)   Collection Time: 01/09/24  2:27 PM   Specimen: BLOOD  Result Value Ref Range Status   Specimen Description BLOOD SITE NOT SPECIFIED  Final   Special Requests   Final    BOTTLES DRAWN AEROBIC AND ANAEROBIC Blood Culture adequate volume   Culture   Final    NO GROWTH 4 DAYS Performed at [facility], [address] [place], [place] [address]    Report Status PENDING  Incomplete  Culture, blood (Routine X 2) w Reflex to ID Panel     Status: None (Preliminary result)   Collection Time: 01/09/24  2:27 PM   Specimen: BLOOD  Result Value Ref Range Status   Specimen Description BLOOD SITE NOT SPECIFIED  Final   Special Requests   Final     BOTTLES DRAWN AEROBIC AND ANAEROBIC Blood Culture adequate volume   Culture   Final    NO GROWTH 4 DAYS Performed at [facility], [address], [place], [place] [address]    Report Status PENDING  Incomplete      Radiology Studies: No results found.     LOS: 10 days    Aneita Keens, MD [facility] 01/13/2024, 2:40 PM   If 7PM-7AM, please contact night-coverage [email or web address]

## 2024-01-13 NOTE — Progress Notes (Signed)
   01/13/24 1202  Vitals  Temp 98.8 F (37.1 C)  Pulse Rate 78  Resp 16  BP 114/62  SpO2 100 %  O2 Device Nasal Cannula  Weight 72 kg  Oxygen Therapy  O2 Flow Rate (L/min) 2 L/min  Patient Activity (if Appropriate) In bed  Pulse Oximetry Type Continuous  Oximetry Probe Site Changed No  Post Treatment  Hemodialysis Intake (mL) 0 mL  Liters Processed 63  Fluid Removed (mL) 2500 mL  Tolerated HD Treatment Yes   Received patient in bed to unit.  Alert and oriented.  Informed consent signed and in chart.   TX duration: 3 hours  Patient tolerated well.  Transported back to the room  Alert, without acute distress.  Hand-off given to patient's nurse.   Access used: Right chest HD catheter Access issues: None  Medication(s) given: Oxycodone  10mg

## 2024-01-14 DIAGNOSIS — R6520 Severe sepsis without septic shock: Secondary | ICD-10-CM | POA: Diagnosis not present

## 2024-01-14 DIAGNOSIS — B377 Candidal sepsis: Secondary | ICD-10-CM | POA: Diagnosis not present

## 2024-01-14 LAB — RENAL FUNCTION PANEL
Albumin: 2.4 g/dL — ABNORMAL LOW (ref 3.5–5.0)
Anion gap: 8 (ref 5–15)
BUN: 24 mg/dL — ABNORMAL HIGH (ref 8–23)
CO2: 26 mmol/L (ref 22–32)
Calcium: 8.3 mg/dL — ABNORMAL LOW (ref 8.9–10.3)
Chloride: 97 mmol/L — ABNORMAL LOW (ref 98–111)
Creatinine, Ser: 5.33 mg/dL — ABNORMAL HIGH (ref 0.44–1.00)
GFR, Estimated: 9 mL/min — ABNORMAL LOW (ref >60)
Glucose, Bld: 76 mg/dL (ref 70–99)
Phosphorus: 5.4 mg/dL — ABNORMAL HIGH (ref 2.5–4.6)
Potassium: 4.2 mmol/L (ref 3.5–5.1)
Sodium: 131 mmol/L — ABNORMAL LOW (ref 135–145)

## 2024-01-14 LAB — CULTURE, BLOOD (ROUTINE X 2)
Culture: NO GROWTH
Culture: NO GROWTH
Special Requests: ADEQUATE
Special Requests: ADEQUATE

## 2024-01-14 LAB — CBC
HCT: 27.1 % — ABNORMAL LOW (ref 36.0–46.0)
Hemoglobin: 8.7 g/dL — ABNORMAL LOW (ref 12.0–15.0)
MCH: 29.6 pg (ref 26.0–34.0)
MCHC: 32.1 g/dL (ref 30.0–36.0)
MCV: 92.2 fL (ref 80.0–100.0)
Platelets: 285 10*3/uL (ref 150–400)
RBC: 2.94 MIL/uL — ABNORMAL LOW (ref 3.87–5.11)
RDW: 17.3 % — ABNORMAL HIGH (ref 11.5–15.5)
WBC: 11 10*3/uL — ABNORMAL HIGH (ref 4.0–10.5)
nRBC: 0 % (ref 0.0–0.2)

## 2024-01-14 LAB — SURGICAL PATHOLOGY

## 2024-01-14 MED ORDER — ACETAMINOPHEN 325 MG PO TABS
650.0000 mg | ORAL_TABLET | Freq: Four times a day (QID) | ORAL | Status: DC
  Administered 2024-01-14 – 2024-01-16 (×7): 650 mg via ORAL
  Filled 2024-01-14 (×7): qty 2

## 2024-01-14 MED ORDER — CHLORHEXIDINE GLUCONATE CLOTH 2 % EX PADS
6.0000 | MEDICATED_PAD | Freq: Every day | CUTANEOUS | Status: DC
  Administered 2024-01-14: 6 via TOPICAL

## 2024-01-14 NOTE — Plan of Care (Signed)

## 2024-01-14 NOTE — Progress Notes (Signed)
 Telemetry called to notify me of pt's HR in the 140's per charge RN. Checked on pt and pt was on the phone fussing with her program coordinator. Pt verbalized frustration and states she is angry due to not being able to receive her voucher. HR rechecked, charted and pt stable.

## 2024-01-14 NOTE — Progress Notes (Signed)
 PROGRESS NOTE    Carol [name]  UEA:[id number] DOB: Apr 06, 1961 DOA: 01/03/2024 PCP: [facility]   Brief Narrative: Carol [name] is a 63 y.o. female with a history of ESRD on hemodialysis, hyperlipidemia, GERD, pulm hypertension.  Patient presented secondary to upper chest pain and shortness of breath in addition to fever and chills and found to have evidence of sepsis secondary to candidemia.  Infectious ease consulted.  Hospitalization also complicated by evidence of cholelithiasis s/p laparoscopic, converted to open, cholecystectomy on 5/23.  Repeat blood cultures (5/19) growing GNRs. Antibiotics regimen adjusted per ID recommendations.   Assessment and Plan:  Sepsis Present on admission secondary to candidia tropicalis candidemia.  Fungemia In setting of HD cath; presumed line infection. Noted on blood cultures (5/16 and 5/17). Cultures positive for candida tropicalis. ID consulted during this admission for management. Patient managed on Micafungin . Prior to admission Carol [name] removed on 5/19 and new TDC placed on 5/21.  Transthoracic Echocardiogram performed on 5/22 significant for no vegetations. Recommendation to complete a 2 week treatment. Transitioned to fluconazole on 5/26; end date of 5/31. -Continue Fluconazole -ID recommendations: EKG daily for at least two days for QTc monitoring while on fluconazole  Burkholderia cepacia group bacteremia Blood cultures (5/19) significant for GNRs. Patient was on Ceftriaxone  prior to cultures. Presumed likely secondary to gallbladder with initial concern for cholecystitis. Ceftriaxone  and flagyl  switched to Ciprofloxacin  and Flagyl  per ID recommendations with further recommendations for a 7-day treatment duration and end date of 5/29.  Cholelithiasis Possible cholecystitis General [name] consulted. Patient underwent laparoscopic, converted to open, cholecystectomy on 5/23.  ESRD on hemodialysis Nephrology consulted  for HD during hospitalization. Right internal jugular tunneled HD catheter removed on 5/19 and new right internal jugular tunneled HD catheter placed on 5/21.  Severe asymptomatic hypertension Improved with antihypertensive therapy and hemodialysis. -Continue amlodipine   Anemia of chronic disease Chronic and stable.  Thyroid  nodule Noted incidentally on CT scan imaging. Outpatient follow-up.    DVT prophylaxis: Subcutaneous heparin  Code Status:   Code Status: Full Code Family Communication: None at bedside Disposition Plan: Discharge home with home health likely in 24 hours if QTc remains stable   Consultants:  Nephrology Infectious disease General [name] Interventional radiology  Procedures:  TDC removal TDC placement Hemodialysis Transthoracic Echocardiogram Transesophageal Echocardiogram Laparoscopic, converted to open, cholecystectomy  Antimicrobials: Vancomycin Micafungin  Flagyl  Ceftriaxone  Azithromycin     Subjective: Concerned that her incision wound's dressing was removed. No other issues this morning.  Objective: BP 123/60 (BP Location: Right Arm)   Pulse 71   Temp 98.8 F (37.1 C) (Oral)   Resp 17   Ht 5\' 4"  (1.626 m)   Wt 71.8 kg   SpO2 92%   BMI 27.17 kg/m   Examination:  General exam: Appears calm and comfortable Respiratory system: Clear to auscultation. Respiratory effort normal. Cardiovascular system: S1 & S2 heard, RRR. Gastrointestinal system: Abdomen is nondistended, soft and nontender. Normal bowel sounds heard. Central nervous system: Alert and oriented. No focal neurological deficits. Psychiatry: Judgement and insight appear normal. Mood & affect appropriate.    Data Reviewed: I have personally reviewed following labs and imaging studies  CBC Lab Results  Component Value Date   WBC 11.0 (H) 01/14/2024   RBC 2.94 (L) 01/14/2024   HGB 8.7 (L) 01/14/2024   HCT 27.1 (L) 01/14/2024   MCV 92.2 01/14/2024   MCH 29.6  01/14/2024   PLT 285 01/14/2024   MCHC 32.1 01/14/2024   RDW 17.3 (H) 01/14/2024  LYMPHSABS 0.2 (L) 01/03/2024   MONOABS 0.0 (L) 01/03/2024   EOSABS 0.1 01/03/2024   BASOSABS 0.0 01/03/2024     Last metabolic panel Lab Results  Component Value Date   NA 131 (L) 01/14/2024   K 4.2 01/14/2024   CL 97 (L) 01/14/2024   CO2 26 01/14/2024   BUN 24 (H) 01/14/2024   CREATININE 5.33 (H) 01/14/2024   GLUCOSE 76 01/14/2024   GFRNONAA 9 (L) 01/14/2024   GFRAA 50 (L) 11/21/2020   CALCIUM  8.3 (L) 01/14/2024   PHOS 5.4 (H) 01/14/2024   PROT 6.4 (L) 01/11/2024   ALBUMIN  2.4 (L) 01/14/2024   LABGLOB 3.0 03/24/2023   AGRATIO 1.0 03/24/2023   BILITOT 0.8 01/11/2024   ALKPHOS 93 01/11/2024   AST 36 01/11/2024   ALT 14 01/11/2024   ANIONGAP 8 01/14/2024    GFR: Estimated Creatinine Clearance: 10.8 mL/min (A) (by C-G formula based on SCr of 5.33 mg/dL (H)).  Recent Results (from the past 240 hours)  Respiratory (~20 pathogens) panel by PCR     Status: None   Collection Time: 01/04/24 11:35 PM   Specimen: Nasopharyngeal Swab; Respiratory  Result Value Ref Range Status   Adenovirus NOT DETECTED NOT DETECTED Final   Coronavirus 229E NOT DETECTED NOT DETECTED Final    Comment: (NOTE) The Coronavirus on the Respiratory Panel, DOES NOT test for the novel  Coronavirus (2019 nCoV)    Coronavirus HKU1 NOT DETECTED NOT DETECTED Final   Coronavirus NL63 NOT DETECTED NOT DETECTED Final   Coronavirus OC43 NOT DETECTED NOT DETECTED Final   Metapneumovirus NOT DETECTED NOT DETECTED Final   Rhinovirus / Enterovirus NOT DETECTED NOT DETECTED Final   Influenza A NOT DETECTED NOT DETECTED Final   Influenza B NOT DETECTED NOT DETECTED Final   Parainfluenza Virus 1 NOT DETECTED NOT DETECTED Final   Parainfluenza Virus 2 NOT DETECTED NOT DETECTED Final   Parainfluenza Virus 3 NOT DETECTED NOT DETECTED Final   Parainfluenza Virus 4 NOT DETECTED NOT DETECTED Final   Respiratory Syncytial Virus NOT  DETECTED NOT DETECTED Final   Bordetella pertussis NOT DETECTED NOT DETECTED Final   Bordetella Parapertussis NOT DETECTED NOT DETECTED Final   Chlamydophila pneumoniae NOT DETECTED NOT DETECTED Final   Mycoplasma pneumoniae NOT DETECTED NOT DETECTED Final    Comment: Performed at [facility], [address] [place], [place] [address]  Culture, blood (Routine X 2) w Reflex to ID Panel     Status: Abnormal   Collection Time: 01/06/24  8:13 AM   Specimen: BLOOD  Result Value Ref Range Status   Specimen Description BLOOD SITE NOT SPECIFIED  Final   Special Requests   Final    BOTTLES DRAWN AEROBIC AND ANAEROBIC Blood Culture results may not be optimal due to an inadequate volume of blood received in culture bottles   Culture  Setup Time   Final    GRAM NEGATIVE RODS BOTTLES DRAWN AEROBIC ONLY CRITICAL RESULT CALLED TO, READ BACK BY AND VERIFIED WITH: V [email or web address]  01/09/24 MK    Culture (A)  Final    BURKHOLDERIA SPECIES BURKHOLDERIA CEPACIA GROUP Performed at [facility], [address] [place], [place] [address]    Report Status 01/10/2024 FINAL  Final   Organism ID, Bacteria BURKHOLDERIA SPECIES  Final      Susceptibility   Burkholderia species - MIC*    CEFEPIME >=32 RESISTANT Resistant     GENTAMICIN <=1 SENSITIVE Sensitive     CIPROFLOXACIN  <=0.25 SENSITIVE  Sensitive     IMIPENEM 2 SENSITIVE Sensitive     TRIMETH/SULFA <=20 SENSITIVE Sensitive     * BURKHOLDERIA SPECIES  Culture, blood (Routine X 2) w Reflex to ID Panel     Status: Abnormal   Collection Time: 01/06/24  8:13 AM   Specimen: BLOOD  Result Value Ref Range Status   Specimen Description BLOOD SITE NOT SPECIFIED  Final   Special Requests   Final    BOTTLES DRAWN AEROBIC AND ANAEROBIC Blood Culture results may not be optimal due to an inadequate volume of blood received in culture bottles   Culture  Setup Time   Final    GRAM NEGATIVE RODS ANAEROBIC BOTTLE ONLY CRITICAL VALUE NOTED.  VALUE  IS CONSISTENT WITH PREVIOUSLY REPORTED AND CALLED VALUE.    Culture (A)  Final    BURKHOLDERIA SPECIES SUSCEPTIBILITIES PERFORMED ON PREVIOUS CULTURE WITHIN THE LAST 5 DAYS. Performed at [facility], [address] [place], [place] [address]    Report Status 01/11/2024 FINAL  Final  Blood Culture ID Panel (Reflexed)     Status: None   Collection Time: 01/06/24  8:13 AM  Result Value Ref Range Status   Enterococcus faecalis NOT DETECTED NOT DETECTED Final   Enterococcus Faecium NOT DETECTED NOT DETECTED Final   Listeria monocytogenes NOT DETECTED NOT DETECTED Final   Staphylococcus species NOT DETECTED NOT DETECTED Final   Staphylococcus aureus (BCID) NOT DETECTED NOT DETECTED Final   Staphylococcus epidermidis NOT DETECTED NOT DETECTED Final   Staphylococcus lugdunensis NOT DETECTED NOT DETECTED Final   Streptococcus species NOT DETECTED NOT DETECTED Final   Streptococcus agalactiae NOT DETECTED NOT DETECTED Final   Streptococcus pneumoniae NOT DETECTED NOT DETECTED Final   Streptococcus pyogenes NOT DETECTED NOT DETECTED Final   A.calcoaceticus-baumannii NOT DETECTED NOT DETECTED Final   Bacteroides fragilis NOT DETECTED NOT DETECTED Final   Enterobacterales NOT DETECTED NOT DETECTED Final   Enterobacter cloacae complex NOT DETECTED NOT DETECTED Final   Escherichia coli NOT DETECTED NOT DETECTED Final   Klebsiella aerogenes NOT DETECTED NOT DETECTED Final   Klebsiella oxytoca NOT DETECTED NOT DETECTED Final   Klebsiella pneumoniae NOT DETECTED NOT DETECTED Final   Proteus species NOT DETECTED NOT DETECTED Final   Salmonella species NOT DETECTED NOT DETECTED Final   Serratia marcescens NOT DETECTED NOT DETECTED Final   Haemophilus influenzae NOT DETECTED NOT DETECTED Final   Neisseria meningitidis NOT DETECTED NOT DETECTED Final   Pseudomonas aeruginosa NOT DETECTED NOT DETECTED Final   Stenotrophomonas maltophilia NOT DETECTED NOT DETECTED Final   Candida albicans NOT  DETECTED NOT DETECTED Final   Candida auris NOT DETECTED NOT DETECTED Final   Candida glabrata NOT DETECTED NOT DETECTED Final   Candida krusei NOT DETECTED NOT DETECTED Final   Candida parapsilosis NOT DETECTED NOT DETECTED Final   Candida tropicalis NOT DETECTED NOT DETECTED Final   Cryptococcus neoformans/gattii NOT DETECTED NOT DETECTED Final    Comment: Performed at [facility], [address], [place], [place] [address]  Culture, blood (Routine X 2) w Reflex to ID Panel     Status: Abnormal   Collection Time: 01/06/24  8:22 PM   Specimen: BLOOD  Result Value Ref Range Status   Specimen Description BLOOD SITE NOT SPECIFIED  Final   Special Requests   Final    BOTTLES DRAWN AEROBIC AND ANAEROBIC Blood Culture adequate volume   Culture  Setup Time   Final    GRAM NEGATIVE RODS AEROBIC BOTTLE  ONLY CRITICAL VALUE NOTED.  VALUE IS CONSISTENT WITH PREVIOUSLY REPORTED AND CALLED VALUE.    Culture (A)  Final    BURKHOLDERIA SPECIES SUSCEPTIBILITIES PERFORMED ON PREVIOUS CULTURE WITHIN THE LAST 5 DAYS. Performed at [name], [address], [place], [place] [address]    Report Status 01/11/2024 FINAL  Final  Culture, blood (Routine X 2) w Reflex to ID Panel     Status: Abnormal   Collection Time: 01/06/24  8:22 PM   Specimen: BLOOD  Result Value Ref Range Status   Specimen Description BLOOD SITE NOT SPECIFIED  Final   Special Requests   Final    BOTTLES DRAWN AEROBIC AND ANAEROBIC Blood Culture adequate volume   Culture  Setup Time   Final    GRAM NEGATIVE RODS AEROBIC BOTTLE ONLY CRITICAL VALUE NOTED.  VALUE IS CONSISTENT WITH PREVIOUSLY REPORTED AND CALLED VALUE.    Culture (A)  Final    BURKHOLDERIA SPECIES SUSCEPTIBILITIES PERFORMED ON PREVIOUS CULTURE WITHIN THE LAST 5 DAYS. Performed at [facility], [address] [place], [place] [address]    Report Status 01/11/2024 FINAL  Final  Culture, blood (Routine X 2) w Reflex to ID Panel     Status:  None   Collection Time: 01/09/24  2:27 PM   Specimen: BLOOD  Result Value Ref Range Status   Specimen Description BLOOD SITE NOT SPECIFIED  Final   Special Requests   Final    BOTTLES DRAWN AEROBIC AND ANAEROBIC Blood Culture adequate volume   Culture   Final    NO GROWTH 5 DAYS Performed at [facility], [address] [place], [place] [address]    Report Status 01/14/2024 FINAL  Final  Culture, blood (Routine X 2) w Reflex to ID Panel     Status: None   Collection Time: 01/09/24  2:27 PM   Specimen: BLOOD  Result Value Ref Range Status   Specimen Description BLOOD SITE NOT SPECIFIED  Final   Special Requests   Final    BOTTLES DRAWN AEROBIC AND ANAEROBIC Blood Culture adequate volume   Culture   Final    NO GROWTH 5 DAYS Performed at [facility], [address] [place], [place] [address]    Report Status 01/14/2024 FINAL  Final      Radiology Studies: No results found.     LOS: 11 days    Aneita Keens, MD [facility] 01/14/2024, 10:48 AM   If 7PM-7AM, please contact night-coverage [email or web address]

## 2024-01-14 NOTE — Progress Notes (Signed)
 Subjective/Chief Complaint: Incisional pain improving. Tolerating diet and having bowel function. Concerned about safety at home with hx of prior fall.    Objective: Vital signs in last 24 hours: Temp:  [98.2 F (36.8 C)-99.2 F (37.3 C)] 98.8 F (37.1 C) (05/27 0526) Pulse Rate:  [71-79] 71 (05/27 0526) Resp:  [14-20] 17 (05/27 0526) BP: (93-139)/(58-79) 123/60 (05/27 0526) SpO2:  [92 %-100 %] 92 % (05/27 0526) Weight:  [71.8 kg-72 kg] 71.8 kg (05/27 0711) Last BM Date : 01/13/24  Intake/Output from previous day: 05/26 0701 - 05/27 0700 In: -  Out: 2500  Intake/Output this shift: No intake/output data recorded.  Exam: Awake and alert Comfortable Abdomen soft, appropriately ttp, ND, incision clean and intact with staples present   Lab Results:  Recent Labs    01/13/24 0547 01/14/24 0511  WBC 11.6* 11.0*  HGB 8.4* 8.7*  HCT 26.1* 27.1*  PLT 246 285   BMET Recent Labs    01/13/24 0547 01/14/24 0511  NA 133* 131*  K 4.0 4.2  CL 99 97*  CO2 23 26  GLUCOSE 79 76  BUN 40* 24*  CREATININE 6.95* 5.33*  CALCIUM  8.2* 8.3*   PT/INR No results for input(s): "LABPROT", "INR" in the last 72 hours.  ABG No results for input(s): "PHART", "HCO3" in the last 72 hours.  Invalid input(s): "PCO2", "PO2"  Studies/Results: No results found.   Anti-infectives: Anti-infectives (From admission, onward)    Start     Dose/Rate Route Frequency Ordered Stop   01/13/24 2200  fluconazole (DIFLUCAN) tablet 200 mg        200 mg Oral Daily at bedtime 01/13/24 1032 01/19/24 2159   01/10/24 2200  ciprofloxacin  (CIPRO ) tablet 500 mg        500 mg Oral Every 24 hours 01/10/24 1439 01/16/24 2359   01/10/24 2200  metroNIDAZOLE  (FLAGYL ) tablet 500 mg        500 mg Oral Every 12 hours 01/10/24 1439 01/16/24 2359   01/10/24 0845  meropenem (MERREM) 500 mg in sodium chloride  0.9 % 100 mL IVPB        500 mg 200 mL/hr over 30 Minutes Intravenous STAT 01/10/24 0830 01/10/24 0910    01/08/24 0810  vancomycin (VANCOCIN) IVPB 1000 mg/200 mL premix        over 60 Minutes Intravenous Continuous PRN 01/08/24 0810 01/08/24 0843   01/08/24 0000  vancomycin (VANCOCIN) IVPB 1000 mg/200 mL premix        1,000 mg 200 mL/hr over 60 Minutes Intravenous  Once 01/07/24 0922 01/08/24 0024   01/07/24 1015  metroNIDAZOLE  (FLAGYL ) tablet 500 mg  Status:  Discontinued        500 mg Oral Every 12 hours 01/07/24 0922 01/10/24 0830   01/05/24 1430  metroNIDAZOLE  (FLAGYL ) IVPB 500 mg  Status:  Discontinued        500 mg 100 mL/hr over 60 Minutes Intravenous Every 12 hours 01/05/24 1330 01/07/24 0922   01/04/24 2030  micafungin  (MYCAMINE ) 100 mg in sodium chloride  0.9 % 100 mL IVPB  Status:  Discontinued        100 mg 105 mL/hr over 1 Hours Intravenous Every 24 hours 01/04/24 1940 01/13/24 1032   01/04/24 1000  cefTRIAXone  (ROCEPHIN ) 2 g in sodium chloride  0.9 % 100 mL IVPB  Status:  Discontinued        2 g 200 mL/hr over 30 Minutes Intravenous Every 24 hours 01/03/24 2055 01/10/24 0830   01/03/24 1615  cefTRIAXone  (ROCEPHIN )  1 g in sodium chloride  0.9 % 100 mL IVPB        1 g 200 mL/hr over 30 Minutes Intravenous  Once 01/03/24 1601 01/03/24 1759   01/03/24 1615  azithromycin  (ZITHROMAX ) 500 mg in sodium chloride  0.9 % 250 mL IVPB        500 mg 250 mL/hr over 60 Minutes Intravenous  Once 01/03/24 1601 01/03/24 1915       Assessment/Plan: Cholelithiasis 63 y/o F admitted with fever, candida bacteremia with imaging studies concerning for inflammatory changes around the gallbladder now s/p lap converted to open chole 5/23  - hgb 8.7 and stable - Continue regular diet - Follow up pathology  - OOB and ambulate in the hall  - Staples to be removed in 4-6 weeks pending clinical course - will arrange this with our office  - stable for DC from surgical perspective once medically cleared - Remainder of care per primary team   Annetta Killian, [facility] 01/14/2024,  10:47 AM Please see Amion for pager number during day hours 7:00am-4:30pm

## 2024-01-14 NOTE — Progress Notes (Addendum)
 Transition of Care [facility]) - Inpatient Brief Assessment   Patient Details  Name: Mylene Bow MRN: [id number] Date of Birth: May 19, 1961  [facility] CM/SW Contact:    Dane Dung, RN Phone Number: 01/14/2024, 9:40 AM   Clinical Narrative: CM met with the patient at the bedside to discuss TOC needs to return home when medically stable.  Patient lives with daughter in the home with active [facility] 4 days per week/ 3 hrs per day.  Patient is S/P open cholecystectomy.  Medicare choice for home health offered to the patient and patient did not have a preference.  Patient active with [facility] in the past.  I called General Kenner, CM with [facility] and she is reviewing for [facility] at this time.  01/14/24 0948 - [facility] was unable to offer services due to staffing.  I called [facility] and requested [facility] services for PT/OT - currently reviewing.  CM will continue to follow the patient for TOC needs to return home when stable.   Transition of Care Asessment: Insurance and Status: (P) Insurance coverage has been reviewed Patient has primary care physician: (P) Yes Home environment has been reviewed: (P) from home with daughter Prior level of function:: (P) RW Prior/Current Home Services: (P) Current home services (Active with [facility] at home 4 days per week/ 3 hrs/day) Social Drivers of Health Review: (P) SDOH reviewed needs interventions Readmission risk has been reviewed: (P) Yes Transition of care needs: (P) transition of care needs identified, TOC will continue to follow

## 2024-01-14 NOTE — Evaluation (Signed)
 Physical Therapy Evaluation Patient Details Name: Carol [name] MRN: [id number] DOB: Nov 18, 1960 Today's Date: 01/14/2024  History of Present Illness  63 y/o F adm 01/03/24 with fevers, shaking, chills, cholecystitis, sepsis. 5/23 lap chole with conversion to open. Fungemia in the setting of HD cath. PMHx: ESRD on HD MWF, HLD, GERD, HTN, dCHF, COPD, T2DM, sarcoidosis of the liver, chronic back pain, polysubstance abuse.  Clinical Impression  Pt pleasant and reports fear of falling after fal 3 months ago but has utilized RW since that time. Pt moving well at mod I- supervision level with RW for hall ambulation and will benefit from balance and gait training to maximize independence, pt confidence and decrease fall risk. Encouraged OOB daily, ambulation and up to bathroom.      If plan is discharge home, recommend the following: Assistance with cooking/housework;A little help with bathing/dressing/bathroom   Can travel by private vehicle        Equipment Recommendations None recommended by PT  Recommendations for Other Services       Functional Status Assessment Patient has had a recent decline in their functional status and demonstrates the ability to make significant improvements in function in a reasonable and predictable amount of time.     Precautions / Restrictions Precautions Precautions: Fall      Mobility  Bed Mobility Overal bed mobility: Modified Independent Bed Mobility: Rolling, Sidelying to Sit           General bed mobility comments: cues for sequence to roll to right and rise from side with use of rail    Transfers Overall transfer level: Modified independent                      Ambulation/Gait Ambulation/Gait assistance: Supervision Gait Distance (Feet): 400 Feet Assistive device: Rolling walker (2 wheels) Gait Pattern/deviations: Step-through pattern, Decreased stride length   Gait velocity interpretation: 1.31 - 2.62 ft/sec,  indicative of limited community ambulator   General Gait Details: cues for posture and direction, pt reports fear of falling and nervous with gait  Stairs            Wheelchair Mobility     Tilt Bed    Modified Rankin (Stroke Patients Only)       Balance Overall balance assessment: Modified Independent                                           Pertinent Vitals/Pain Pain Assessment Pain Score: 3  Pain Location: abdomen Pain Descriptors / Indicators: Aching Pain Intervention(s): Limited activity within patient's tolerance, Monitored during session, RN gave pain meds during session, Repositioned    Home Living Family/patient expects to be discharged to:: Private residence Living Arrangements: Children Available Help at Discharge: Family;Available PRN/intermittently Type of Home: House Home Access: Level entry       Home Layout: One level Home Equipment: Cane - single Librarian, academic (2 wheels);Shower seat;Grab bars - tub/shower Additional Comments: Aide comes Sun., Tues, Thurs, Sat, 3 hours each day    Prior Function Prior Level of Function : Independent/Modified Independent;Driving             Mobility Comments: uses RW ADLs Comments: aide to assist with ADLs,     Extremity/Trunk Assessment   Upper Extremity Assessment Upper Extremity Assessment: Overall WFL for tasks assessed    Lower Extremity Assessment Lower Extremity Assessment: Overall  WFL for tasks assessed    Cervical / Trunk Assessment Cervical / Trunk Assessment: Normal  Communication   Communication Communication: No apparent difficulties    Cognition Arousal: Alert Behavior During Therapy: WFL for tasks assessed/performed   PT - Cognitive impairments: No apparent impairments                         Following commands: Intact       Cueing Cueing Techniques: Verbal cues     General Comments      Exercises     Assessment/Plan    PT  Assessment Patient needs continued PT services  PT Problem List Decreased activity tolerance;Decreased balance;Decreased mobility       PT Treatment Interventions DME instruction;Gait training;Functional mobility training;Therapeutic exercise;Patient/family education;Balance training    PT Goals (Current goals can be found in the Care Plan section)  Acute Rehab PT Goals Patient Stated Goal: walk on my own, drive to HD PT Goal Formulation: With patient Time For Goal Achievement: 01/28/24 Potential to Achieve Goals: Good    Frequency Min 1X/week     Co-evaluation               AM-PAC PT "6 Clicks" Mobility  Outcome Measure Help needed turning from your back to your side while in a flat bed without using bedrails?: None Help needed moving from lying on your back to sitting on the side of a flat bed without using bedrails?: A Little Help needed moving to and from a bed to a chair (including a wheelchair)?: A Little Help needed standing up from a chair using your arms (e.g., wheelchair or bedside chair)?: A Little Help needed to walk in hospital room?: A Little Help needed climbing 3-5 steps with a railing? : A Little 6 Click Score: 19    End of Session   Activity Tolerance: Patient tolerated treatment well Patient left: in chair;with call bell/phone within reach Nurse Communication: Mobility status PT Visit Diagnosis: Other abnormalities of gait and mobility (R26.89);Difficulty in walking, not elsewhere classified (R26.2)    Time: 6295-2841 PT Time Calculation (min) (ACUTE ONLY): 22 min   Charges:   PT Evaluation $PT Eval Low Complexity: 1 Low   PT General Charges $$ ACUTE PT VISIT: 1 Visit         Annis Baseman, PT Acute Rehabilitation Services Office: [phone]   Carol [name] 01/14/2024, 9:57 AM

## 2024-01-14 NOTE — Discharge Instructions (Signed)

## 2024-01-14 NOTE — Progress Notes (Signed)
  [facility] Progress Note  Subjective:  Seen in room No c/o's  Vitals:   01/13/24 2034 01/14/24 0009 01/14/24 0526 01/14/24 0711  BP: 139/67 (!) 123/58 123/60   Pulse: 79 75 71   Resp: 17 17 17    Temp: 99.1 F (37.3 C) 99.2 F (37.3 C) 98.8 F (37.1 C)   TempSrc: Oral Oral Oral   SpO2: 94% 92% 92%   Weight:    71.8 kg  Height:        Exam: General: Alert female in NAD, on RA Heart: RRR, no murmrus, rubs or gallops Lungs: Clear anteriorly, respirations unlabored Abdomen: Soft, non-distended, +BS, mid-abd incision Extremities: No edema b/l lower extremities Dialysis Access:  new TDC placed 5/21 AVF +bruit (maturing, needs 2nd stage surgery)  Renal-related home meds: Norvasc  10 every day Others: asa, statin, gabapentin , sl ntg, percocet, PPI, symbicort, prns   [facility] HD: [facility] 4h   B500   70.5kg    TDC    Heparin  2000 Coming off 2-4kg over last 2 wks   Assessment/ Plan: 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 SP TDC removal 5/19 and replacement 5/21. Per ID.   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 S/P lap chole on 5/23. MRI L-S spine done for pain showed no discitis.  ESRD: on HD MWF. HD tomorrow.  HD access: has maturing AVF, will need 2nd stage surgery HTN: BP's good today, cont norvasc  10mg  every day as at home Volume: up 1- 2kg, UF goal 1-2 L next HD.  Anemia of esrd: Hb 8-10 here, cont darbe 100 mcg sq weekly  Secondary hyperparathyroidism: CCa and phos in range. Not on binders at this time.   Larry Poag MD  [facility] 01/14/2024, 10:23 AM  Recent Labs  Lab 01/13/24 0547 01/14/24 0511  HGB 8.4* 8.7*  ALBUMIN  2.5* 2.4*  CALCIUM  8.2* 8.3*  PHOS 6.6* 5.4*  CREATININE 6.95* 5.33*  K 4.0 4.2   No results for input(s): "IRON ", "TIBC", "FERRITIN" in the last 168 hours. Inpatient medications:  (feeding supplement) PROSource Plus  30 mL Oral BID BM   acetaminophen   650 mg Oral Q6H   amLODipine   10 mg Oral Daily    aspirin  EC  81 mg Oral Daily   atorvastatin   80 mg Oral Daily   Chlorhexidine  Gluconate Cloth  6 each Topical Q0600   ciprofloxacin   500 mg Oral Q24H   [START ON 01/15/2024] darbepoetin (ARANESP ) injection - DIALYSIS  100 mcg Subcutaneous Q Wed-1800   fluconazole  200 mg Oral QHS   gabapentin   100 mg Oral BID   heparin   5,000 Units Subcutaneous Q8H   metroNIDAZOLE   500 mg Oral Q12H   pantoprazole   40 mg Oral QHS   polyethylene glycol  17 g Oral Daily   senna-docusate  1 tablet Oral BID    bisacodyl , diphenhydrAMINE , HYDROmorphone  (DILAUDID ) injection, ipratropium-albuterol , ondansetron  (ZOFRAN ) IV, oxyCODONE 

## 2024-01-15 DIAGNOSIS — K819 Cholecystitis, unspecified: Secondary | ICD-10-CM

## 2024-01-15 DIAGNOSIS — B377 Candidal sepsis: Secondary | ICD-10-CM | POA: Diagnosis not present

## 2024-01-15 DIAGNOSIS — R7881 Bacteremia: Secondary | ICD-10-CM | POA: Diagnosis not present

## 2024-01-15 DIAGNOSIS — N186 End stage renal disease: Secondary | ICD-10-CM | POA: Diagnosis not present

## 2024-01-15 LAB — RENAL FUNCTION PANEL
Albumin: 2.9 g/dL — ABNORMAL LOW (ref 3.5–5.0)
Anion gap: 13 (ref 5–15)
BUN: 34 mg/dL — ABNORMAL HIGH (ref 8–23)
CO2: 25 mmol/L (ref 22–32)
Calcium: 9 mg/dL (ref 8.9–10.3)
Chloride: 94 mmol/L — ABNORMAL LOW (ref 98–111)
Creatinine, Ser: 6.81 mg/dL — ABNORMAL HIGH (ref 0.44–1.00)
GFR, Estimated: 6 mL/min — ABNORMAL LOW (ref >60)
Glucose, Bld: 82 mg/dL (ref 70–99)
Phosphorus: 7.1 mg/dL — ABNORMAL HIGH (ref 2.5–4.6)
Potassium: 4 mmol/L (ref 3.5–5.1)
Sodium: 132 mmol/L — ABNORMAL LOW (ref 135–145)

## 2024-01-15 LAB — CBC
HCT: 27.2 % — ABNORMAL LOW (ref 36.0–46.0)
Hemoglobin: 8.7 g/dL — ABNORMAL LOW (ref 12.0–15.0)
MCH: 29.8 pg (ref 26.0–34.0)
MCHC: 32 g/dL (ref 30.0–36.0)
MCV: 93.2 fL (ref 80.0–100.0)
Platelets: 329 10*3/uL (ref 150–400)
RBC: 2.92 MIL/uL — ABNORMAL LOW (ref 3.87–5.11)
RDW: 17 % — ABNORMAL HIGH (ref 11.5–15.5)
WBC: 9 10*3/uL (ref 4.0–10.5)
nRBC: 0 % (ref 0.0–0.2)

## 2024-01-15 MED ORDER — HEPARIN SODIUM (PORCINE) 1000 UNIT/ML DIALYSIS
1000.0000 [IU] | INTRAMUSCULAR | Status: DC | PRN
  Administered 2024-01-15: 1000 [IU]

## 2024-01-15 MED ORDER — LIDOCAINE-PRILOCAINE 2.5-2.5 % EX CREA: 1.0000 | TOPICAL_CREAM | CUTANEOUS | Status: DC | PRN

## 2024-01-15 MED ORDER — HEPARIN SODIUM (PORCINE) 1000 UNIT/ML DIALYSIS
1000.0000 [IU] | INTRAMUSCULAR | Status: DC | PRN
Start: 2024-01-15 — End: 2024-01-15

## 2024-01-15 MED ORDER — HEPARIN SODIUM (PORCINE) 1000 UNIT/ML DIALYSIS
2000.0000 [IU] | Freq: Once | INTRAMUSCULAR | Status: AC
  Administered 2024-01-15: 2000 [IU] via INTRAVENOUS_CENTRAL
  Filled 2024-01-15: qty 2

## 2024-01-15 MED ORDER — LIDOCAINE HCL (PF) 1 % IJ SOLN: 5.0000 mL | INTRAMUSCULAR | Status: DC | PRN

## 2024-01-15 MED ORDER — PENTAFLUOROPROP-TETRAFLUOROETH EX AERO: 1.0000 | INHALATION_SPRAY | CUTANEOUS | Status: DC | PRN

## 2024-01-15 MED ORDER — SULFAMETHOXAZOLE-TRIMETHOPRIM 800-160 MG PO TABS
1.0000 | ORAL_TABLET | Freq: Once | ORAL | Status: AC
  Administered 2024-01-15: 1 via ORAL
  Filled 2024-01-15: qty 1

## 2024-01-15 MED ORDER — NEPRO/CARBSTEADY PO LIQD: 237.0000 mL | ORAL | Status: DC | PRN

## 2024-01-15 MED ORDER — HEPARIN SODIUM (PORCINE) 1000 UNIT/ML DIALYSIS
1000.0000 [IU] | INTRAMUSCULAR | Status: AC | PRN
  Administered 2024-01-15: 1000 [IU] via INTRAVENOUS_CENTRAL

## 2024-01-15 MED ORDER — ALTEPLASE 2 MG IJ SOLR: 2.0000 mg | Freq: Once | INTRAMUSCULAR | Status: DC | PRN

## 2024-01-15 MED ORDER — SULFAMETHOXAZOLE-TRIMETHOPRIM 400-80 MG PO TABS
1.0000 | ORAL_TABLET | Freq: Two times a day (BID) | ORAL | Status: DC
  Administered 2024-01-15 – 2024-01-16 (×2): 1 via ORAL
  Filled 2024-01-15 (×2): qty 1

## 2024-01-15 MED ORDER — ANTICOAGULANT SODIUM CITRATE 4% (200MG/5ML) IV SOLN: 5.0000 mL | Status: DC | PRN

## 2024-01-15 MED ORDER — HEPARIN SODIUM (PORCINE) 1000 UNIT/ML IJ SOLN
INTRAMUSCULAR | Status: AC
  Filled 2024-01-15: qty 3

## 2024-01-15 MED ORDER — HEPARIN SODIUM (PORCINE) 1000 UNIT/ML IJ SOLN
INTRAMUSCULAR | Status: AC
  Filled 2024-01-15: qty 4

## 2024-01-15 NOTE — Progress Notes (Signed)
 [facility] Progress Note  Subjective:  Seen in room, no c/o's  Vitals:   01/15/24 0008 01/15/24 0500 01/15/24 0530 01/15/24 0811  BP: (!) 119/53  112/67 115/61  Pulse: 65  67 66  Resp: 16  16   Temp: 97.8 F (36.6 C)  98.2 F (36.8 C) 97.6 F (36.4 C)  TempSrc:    Oral  SpO2: 96%  97% 93%  Weight:  75.5 kg    Height:        Exam: General: Alert female in NAD, on RA Heart: RRR, no murmrus, rubs or gallops Lungs: Clear anteriorly, respirations unlabored Abdomen: Soft, non-distended, +BS, mid-abd incision Extremities: No edema b/l lower extremities Dialysis Access:  new TDC placed 5/21 AVF +bruit (maturing, needs 2nd stage surgery)  Renal-related home meds: Norvasc  10 every day Others: asa, statin, gabapentin , sl ntg, percocet, PPI, symbicort, prns   OP HD: [facility] 4h   B500   70.5kg    TDC    Heparin  2000 Coming off 2-4kg over last 2 wks   Assessment/ Plan: 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 SP TDC removal 5/19 and replacement 5/21. Per ID pt rec'd IV micafungin  and has now transitioned to po fluconazole to complete 2 wks total on 5/31.  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 S/P lap chole on 5/23. IV rocephin / flagyl  now switched to po cipro / flagyl  for 7 days total per ID.  ESRD: on HD MWF. HD today.  HD access: has maturing AVF, will need 2nd stage surgery HTN: BP's good today, cont norvasc  10mg  every day as at home Volume: up 1- 2kg, UF goal 1-2 L next HD.  Anemia of esrd: Hb 8-10 here, cont darbe 100 mcg sq weekly  Secondary hyperparathyroidism: CCa and phos in range. Not on binders at this time.   Larry Poag MD  [facility] 01/15/2024, 8:55 AM  Recent Labs  Lab 01/14/24 0511 01/15/24 0603  HGB 8.7* 8.7*  ALBUMIN  2.4* 2.9*  CALCIUM  8.3* 9.0  PHOS 5.4* 7.1*  CREATININE 5.33* 6.81*  K 4.2 4.0   No results for input(s): "IRON ", "TIBC", "FERRITIN" in the last 168 hours. Inpatient medications:   (feeding supplement) PROSource Plus  30 mL Oral BID BM   acetaminophen   650 mg Oral Q6H   amLODipine   10 mg Oral Daily   aspirin  EC  81 mg Oral Daily   atorvastatin   80 mg Oral Daily   Chlorhexidine  Gluconate Cloth  6 each Topical Q0600   Chlorhexidine  Gluconate Cloth  6 each Topical Q0600   ciprofloxacin   500 mg Oral Q24H   darbepoetin (ARANESP ) injection - DIALYSIS  100 mcg Subcutaneous Q Wed-1800   fluconazole  200 mg Oral QHS   gabapentin   100 mg Oral BID   heparin   5,000 Units Subcutaneous Q8H   metroNIDAZOLE   500 mg Oral Q12H   pantoprazole   40 mg Oral QHS   polyethylene glycol  17 g Oral Daily   senna-docusate  1 tablet Oral BID    bisacodyl , diphenhydrAMINE , HYDROmorphone  (DILAUDID ) injection, ipratropium-albuterol , ondansetron  (ZOFRAN ) IV, oxyCODONE 

## 2024-01-15 NOTE — Care Management Important Message (Signed)
 Important Message  Patient Details  Name: Carol [name] MRN: [id number] Date of Birth: 1961/01/16   Important Message Given:  Yes - Medicare IM     Wynonia Hedges 01/15/2024, 4:52 PM

## 2024-01-15 NOTE — Plan of Care (Signed)
 Id brief note   EKG shows qtc increasingly prolonged Will drop cipro  and finish bukholderia bsi tx with one more dose of bactrim tomorrow; flagyl  to finish tomorrow as well Fluconazole can be continued until 5/31 Discussed with primary team

## 2024-01-15 NOTE — Progress Notes (Signed)
 Progress Note   Patient: Carol [name] QMV:[id number] DOB: 1960-12-24 DOA: 01/03/2024     12 DOS: the patient was seen and examined on 01/15/2024   Brief hospital course:  Charliegh Vasudevan is a 63 y.o. female with a history of ESRD on hemodialysis, hyperlipidemia, GERD, pulm hypertension. Patient presented secondary to upper chest pain and shortness of breath in addition to fever and chills and found to have evidence of sepsis secondary to candidemia. Infectious ease consulted. Hospitalization also complicated by evidence of cholelithiasis s/p laparoscopic, converted to open, cholecystectomy on 5/23. Repeat blood cultures (5/19) growing GNRs. Antibiotics regimen adjusted per ID recommendations.   Assessment and Plan:  Sepsis secondary to fungemia/Burkholderia bacteremia - Noted on admission.  Fungemia with Candida.  TDC removed 5/19 and replaced 5/21.  Followed closely by infectious disease.  Initially on micafungin , subsequently transition to fluconazole 5/26.  End date 5/31.  Blood cultures 5/19 showing gram-negative rods.  Broad-spectrum antibiotic coverage with Cipro  plus Flagyl .  Follow Cipro  transition to Bactrim secondary to QT prolongation.  Will complete antibiotic therapy tomorrow 5/29.  Possible cholecystitis with cholelithiasis - General Surgery consulted.  S/p laparoscopic, converted to open cholecystectomy 5/23.  ESRD - HD MWF.  Right IJ tunneled HD cath replaced 5/21.  Hypertension - Amlodipine  on board.  Followed closely by nephrology.  Anemia of chronic disease - Stable.  No active bleeding appreciated.  Thyroid  nodule - Noted incidentally on CT scan.  L patient follow-up recommended.   Subjective: Patient resting comfortably this morning.  Denies any shortness of breath, fever, chills, nausea, vomiting.  Scheduled for dialysis later today.  Feeling well otherwise.  Will complete antibiotic therapy tomorrow.  Patient states she would prefer to go home  tomorrow and not rush home after dialysis today.  Physical Exam: Vitals:   01/15/24 0008 01/15/24 0500 01/15/24 0530 01/15/24 0811  BP: (!) 119/53  112/67 115/61  Pulse: 65  67 66  Resp: 16  16   Temp: 97.8 F (36.6 C)  98.2 F (36.8 C) 97.6 F (36.4 C)  TempSrc:    Oral  SpO2: 96%  97% 93%  Weight:  75.5 kg    Height:        GENERAL:  Alert, pleasant, no acute distress  HEENT:  EOMI CARDIOVASCULAR:  RRR, no murmurs appreciated RESPIRATORY:  Clear to auscultation, no wheezing, rales, or rhonchi GASTROINTESTINAL:  Soft, nontender, nondistended EXTREMITIES:  No LE edema bilaterally NEURO:  No new focal deficits appreciated SKIN:  No rashes noted PSYCH:  Appropriate mood and affect     Data Reviewed:  No new imaging review  Previous records (including but not limited to H&P, progress notes, nursing notes, TOC management) were reviewed in assessment of this patient.  Labs: CBC: Recent Labs  Lab 01/11/24 0925 01/12/24 0730 01/13/24 0547 01/14/24 0511 01/15/24 0603  WBC 6.2 10.0 11.6* 11.0* 9.0  HGB 7.8* 8.3* 8.4* 8.7* 8.7*  HCT 24.2* 26.0* 26.1* 27.1* 27.2*  MCV 89.6 91.9 91.6 92.2 93.2  PLT 175 234 246 285 329   Basic Metabolic Panel: Recent Labs  Lab 01/11/24 0824 01/11/24 0925 01/12/24 0730 01/13/24 0547 01/14/24 0511 01/15/24 0603  NA 135 134* 135 133* 131* 132*  K 3.7 3.6 3.7 4.0 4.2 4.0  CL 98 96* 100 99 97* 94*  CO2 23 27 25 23 26 25   GLUCOSE 116* 158* 86 79 76 82  BUN 21 20 31* 40* 24* 34*  CREATININE 4.09* 4.33* 5.64* 6.95* 5.33* 6.81*  CALCIUM  8.5* 8.3* 8.4* 8.2* 8.3* 9.0  PHOS 4.1  --  5.4* 6.6* 5.4* 7.1*   Liver Function Tests: Recent Labs  Lab 01/10/24 1459 01/11/24 0824 01/11/24 0925 01/12/24 0730 01/13/24 0547 01/14/24 0511 01/15/24 0603  AST 51*  --  36  --   --   --   --   ALT 14  --  14  --   --   --   --   ALKPHOS 119  --  93  --   --   --   --   BILITOT 0.9  --  0.8  --   --   --   --   PROT 7.1  --  6.4*  --   --   --    --   ALBUMIN  2.7*   < > 2.4* 2.5* 2.5* 2.4* 2.9*   < > = values in this interval not displayed.   CBG: Recent Labs  Lab 01/10/24 0552 01/10/24 0717 01/10/24 1032  GLUCAP 75 81 136*    Scheduled Meds:  (feeding supplement) PROSource Plus  30 mL Oral BID BM   acetaminophen   650 mg Oral Q6H   amLODipine   10 mg Oral Daily   aspirin  EC  81 mg Oral Daily   atorvastatin   80 mg Oral Daily   Chlorhexidine  Gluconate Cloth  6 each Topical Q0600   Chlorhexidine  Gluconate Cloth  6 each Topical Q0600   darbepoetin (ARANESP ) injection - DIALYSIS  100 mcg Subcutaneous Q Wed-1800   fluconazole  200 mg Oral QHS   gabapentin   100 mg Oral BID   heparin   5,000 Units Subcutaneous Q8H   metroNIDAZOLE   500 mg Oral Q12H   pantoprazole   40 mg Oral QHS   polyethylene glycol  17 g Oral Daily   senna-docusate  1 tablet Oral BID   sulfamethoxazole-trimethoprim  1 tablet Oral Once   sulfamethoxazole-trimethoprim  1 tablet Oral Q12H   Continuous Infusions: PRN Meds:.bisacodyl , diphenhydrAMINE , HYDROmorphone  (DILAUDID ) injection, ipratropium-albuterol , ondansetron  (ZOFRAN ) IV, oxyCODONE   Family Communication: None at bedside  Disposition: Status is: Inpatient Remains inpatient appropriate because: Sepsis, ESRD on HD     Time spent: 40 minutes  Length of stay: 12 days  Author: Jodeane Mulligan, DO 01/15/2024 11:33 AM  For on call review [email or web address].

## 2024-01-15 NOTE — Hospital Course (Signed)
 Carol [name] is a 63 y.o. female with a history of ESRD on hemodialysis, hyperlipidemia, GERD, pulm hypertension. Patient presented secondary to upper chest pain and shortness of breath in addition to fever and chills and found to have evidence of sepsis secondary to candidemia. Infectious ease consulted. Hospitalization also complicated by evidence of cholelithiasis s/p laparoscopic, converted to open, cholecystectomy on 5/23. Repeat blood cultures (5/19) growing GNRs. Antibiotics regimen adjusted per ID recommendations.    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  Fungemia with Candida.  TDC removed 5/19 and replaced 5/21.  Followed closely by infectious disease.  Initially on micafungin , subsequently transition to fluconazole 5/26.  End date 5/31.  Blood cultures 5/19 showing gram-negative rods.  Broad-spectrum antibiotic coverage with Cipro  plus Flagyl .  Follow Cipro  transition to Bactrim secondary to QT prolongation.  Will complete antibiotic therapy tomorrow 5/29.   Possible cholecystitis with cholelithiasis - General Surgery consulted.  S/p laparoscopic, converted to open cholecystectomy 5/23.   ESRD - HD MWF.  Right IJ tunneled HD cath replaced 5/21.   Hypertension - Amlodipine  on board.  Followed closely by nephrology.   Anemia of chronic disease - Stable.  No active bleeding appreciated.   Thyroid  nodule - Noted incidentally on CT scan.  L patient follow-up recommended.

## 2024-01-15 NOTE — Plan of Care (Signed)

## 2024-01-15 NOTE — Progress Notes (Signed)
   01/15/24 2001  Vitals  Temp 97.9 F (36.6 C)  Pulse Rate 74  Resp 18  BP 112/72  SpO2 97 %  O2 Device Room Air  Weight (S)  73.6 kg (Bed Scale)  Type of Weight Post-Dialysis  Oxygen Therapy  Patient Activity (if Appropriate) In bed  Pulse Oximetry Type Continuous  Post Treatment  Dialyzer Clearance Clear  Hemodialysis Intake (mL) 0 mL  Liters Processed 70.1  Fluid Removed (mL) 2000 mL  Tolerated HD Treatment Yes  Post-Hemodialysis Comments Tx. completed without difficulties. Pt. voice no concerns and report call to 2W bedside RN. Admin medication per. order.   Received patient in bed to unit.  Alert and oriented.  Informed consent signed and in chart.   TX duration: 3.25  Patient tolerated well.  Transported back to the room  Alert, without acute distress.  Hand-off given to patient's nurse.   Access used: Yes Access issues: No  Total UF removed: 200 Medication(s) given: Heparin  2000 bolus + 1000 midrun prn and Heplock Heparin  3800 Post HD VS: See Above Grid Post HD weight: 73.6 kg   [facility]

## 2024-01-15 NOTE — Progress Notes (Signed)
 Occupational Therapy Treatment Patient Details Name: Carol [name] MRN: [id number] DOB: 10-Oct-1960 Today's Date: 01/15/2024   History of present illness 63 y/o F adm 01/03/24 with fevers, shaking, chills, cholecystitis, sepsis. 5/23 lap chole with conversion to open. Fungemia in the setting of HD cath. PMHx: ESRD on HD MWF, HLD, GERD, HTN, dCHF, COPD, T2DM, sarcoidosis of the liver, chronic back pain, polysubstance abuse.   OT comments  Pt progressing toward goals this session, educated on use of reacher and performing figure 4 for LB ADLs, pt able to demo. Pt supervision- CGA for ADLs, mod I for bed mobility and supervision for transfers with use of RW. Pt ambulating household distance with RW, SpO2 96% on RA at end of session. Reiterated use of pillow to brace abdomen when coughing/sneezing and pt verbalized understanding. Pt presenting with impairments listed below, will follow acutely. Continue to recommend HHOT at d/c.       If plan is discharge home, recommend the following:  A little help with walking and/or transfers;A little help with bathing/dressing/bathroom;Assistance with cooking/housework;Assist for transportation;Help with stairs or ramp for entrance   Equipment Recommendations  None recommended by OT    Recommendations for Other Services PT consult    Precautions / Restrictions Precautions Precautions: Fall Restrictions Weight Bearing Restrictions Per Provider Order: No       Mobility Bed Mobility Overal bed mobility: Modified Independent                  Transfers Overall transfer level: Needs assistance Equipment used: Rolling walker (2 wheels) Transfers: Sit to/from Stand Sit to Stand: Supervision                 Balance Overall balance assessment: Modified Independent                                         ADL either performed or assessed with clinical judgement   ADL Overall ADL's : Needs assistance/impaired                  Upper Body Dressing : Contact guard assist;Sitting Upper Body Dressing Details (indicate cue type and reason): donning gown on backside Lower Body Dressing: Supervision/safety;With adaptive equipment Lower Body Dressing Details (indicate cue type and reason): use of reacher Toilet Transfer: Contact guard assist;Ambulation;Regular Toilet;Rolling walker (2 wheels) Toilet Transfer Details (indicate cue type and reason): simulated via functional mobility         Functional mobility during ADLs: Supervision/safety;Rolling walker (2 wheels)      Extremity/Trunk Assessment Upper Extremity Assessment Upper Extremity Assessment: Overall WFL for tasks assessed   Lower Extremity Assessment Lower Extremity Assessment: Defer to PT evaluation        Vision   Vision Assessment?: No apparent visual deficits   Perception Perception Perception: Not tested   Praxis Praxis Praxis: Not tested   Communication Communication Communication: No apparent difficulties   Cognition Arousal: Alert Behavior During Therapy: WFL for tasks assessed/performed Cognition: No apparent impairments                               Following commands: Intact        Cueing   Cueing Techniques: Verbal cues  Exercises      Shoulder Instructions       General Comments SpO2 96% on RA at end  of session    Pertinent Vitals/ Pain       Pain Assessment Pain Assessment: No/denies pain  Home Living                                          Prior Functioning/Environment              Frequency  Min 2X/week        Progress Toward Goals  OT Goals(current goals can now be found in the care plan section)  Progress towards OT goals: Progressing toward goals  Acute Rehab OT Goals Patient Stated Goal: none stated OT Goal Formulation: With patient Time For Goal Achievement: 01/27/24 Potential to Achieve Goals: Good ADL Goals Pt Will Perform  Upper Body Dressing: Independently;sitting Pt Will Perform Lower Body Dressing: Independently;sitting/lateral leans;sit to/from stand;with adaptive equipment Pt Will Transfer to Toilet: Independently;ambulating;regular height toilet Pt Will Perform Toileting - Clothing Manipulation and hygiene: Independently;sitting/lateral leans Pt Will Perform Tub/Shower Transfer: Tub transfer;Shower transfer;Independently;ambulating  Plan      Co-evaluation                 AM-PAC OT "6 Clicks" Daily Activity     Outcome Measure   Help from another person eating meals?: None Help from another person taking care of personal grooming?: A Little Help from another person toileting, which includes using toliet, bedpan, or urinal?: A Little Help from another person bathing (including washing, rinsing, drying)?: A Lot Help from another person to put on and taking off regular upper body clothing?: A Little Help from another person to put on and taking off regular lower body clothing?: A Little 6 Click Score: 18    End of Session Equipment Utilized During Treatment: Rolling walker (2 wheels);Oxygen  OT Visit Diagnosis: Unsteadiness on feet (R26.81);Other abnormalities of gait and mobility (R26.89);Muscle weakness (generalized) (M62.81)   Activity Tolerance Patient tolerated treatment well   Patient Left in chair;with call bell/phone within reach   Nurse Communication Mobility status (O2)        Time: 1610-9604 OT Time Calculation (min): 23 min  Charges: OT General Charges $OT Visit: 1 Visit OT Treatments $Self Care/Home Management : 8-22 mins $Therapeutic Activity: 8-22 mins  Stiven Kaspar K, OTD, OTR/L SecureChat Preferred Acute Rehab [phone]   Benedict Brain Koonce 01/15/2024, 1:45 PM

## 2024-01-16 ENCOUNTER — Other Ambulatory Visit (HOSPITAL_COMMUNITY): Payer: Self-pay

## 2024-01-16 DIAGNOSIS — K819 Cholecystitis, unspecified: Secondary | ICD-10-CM | POA: Diagnosis not present

## 2024-01-16 DIAGNOSIS — R7881 Bacteremia: Secondary | ICD-10-CM | POA: Diagnosis not present

## 2024-01-16 DIAGNOSIS — B377 Candidal sepsis: Secondary | ICD-10-CM | POA: Diagnosis not present

## 2024-01-16 DIAGNOSIS — R6520 Severe sepsis without septic shock: Secondary | ICD-10-CM | POA: Diagnosis not present

## 2024-01-16 LAB — RENAL FUNCTION PANEL
Albumin: 2.5 g/dL — ABNORMAL LOW (ref 3.5–5.0)
Anion gap: 8 (ref 5–15)
BUN: 17 mg/dL (ref 8–23)
CO2: 26 mmol/L (ref 22–32)
Calcium: 8.4 mg/dL — ABNORMAL LOW (ref 8.9–10.3)
Chloride: 98 mmol/L (ref 98–111)
Creatinine, Ser: 4.73 mg/dL — ABNORMAL HIGH (ref 0.44–1.00)
GFR, Estimated: 10 mL/min — ABNORMAL LOW (ref >60)
Glucose, Bld: 69 mg/dL — ABNORMAL LOW (ref 70–99)
Phosphorus: 4.9 mg/dL — ABNORMAL HIGH (ref 2.5–4.6)
Potassium: 3.9 mmol/L (ref 3.5–5.1)
Sodium: 132 mmol/L — ABNORMAL LOW (ref 135–145)

## 2024-01-16 LAB — CBC
HCT: 25.2 % — ABNORMAL LOW (ref 36.0–46.0)
Hemoglobin: 8.2 g/dL — ABNORMAL LOW (ref 12.0–15.0)
MCH: 29.5 pg (ref 26.0–34.0)
MCHC: 32.5 g/dL (ref 30.0–36.0)
MCV: 90.6 fL (ref 80.0–100.0)
Platelets: 289 10*3/uL (ref 150–400)
RBC: 2.78 MIL/uL — ABNORMAL LOW (ref 3.87–5.11)
RDW: 16.9 % — ABNORMAL HIGH (ref 11.5–15.5)
WBC: 8.3 10*3/uL (ref 4.0–10.5)
nRBC: 0 % (ref 0.0–0.2)

## 2024-01-16 LAB — MAGNESIUM: Magnesium: 1.8 mg/dL (ref 1.7–2.4)

## 2024-01-16 MED ORDER — FLUCONAZOLE 200 MG PO TABS
200.0000 mg | ORAL_TABLET | Freq: Every day | ORAL | 0 refills | Status: DC
  Filled 2024-01-16: qty 3, 3d supply, fill #0

## 2024-01-16 NOTE — Plan of Care (Signed)

## 2024-01-16 NOTE — Discharge Summary (Signed)
 Physician Discharge Summary   Patient: Carol [name] MRN: [id number] DOB: 05/08/1961  Admit date:     01/03/2024  Discharge date: 01/16/24  Discharge Physician: Jodeane Mulligan   PCP: [facility]   Recommendations at discharge:    Pt to be discharged home with home health.   If you experience worsening fever, chills, chest pain, shortness of breath, or other concerning symptoms, please call your PCP or go to the emergency department immediately.  Discharge Diagnoses: Principal Problem:   Sepsis (HCC) Active Problems:   Candidemia (HCC)   Bacteremia   Cholecystitis  Resolved Problems:   * No resolved hospital problems. *   Hospital Course:  Jeff Mccallum is a 63 y.o. female with a history of ESRD on hemodialysis, hyperlipidemia, GERD, pulm hypertension. Patient presented secondary to upper chest pain and shortness of breath in addition to fever and chills and found to have evidence of sepsis secondary to candidemia. Infectious ease consulted. Hospitalization also complicated by evidence of cholelithiasis s/p laparoscopic, converted to open, cholecystectomy on 5/23. Repeat blood cultures (5/19) growing GNRs. Antibiotics regimen adjusted per ID recommendations.    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  Fungemia with Candida.  TDC removed 5/19 and replaced 5/21.  Followed closely by infectious disease.  Initially on micafungin , subsequently transition to fluconazole 5/26.  End date 5/31.  Blood cultures 5/19 showing gram-negative rods.  Broad-spectrum antibiotic coverage with Cipro  plus Flagyl .  Follow Cipro  transition to Bactrim secondary to QT prolongation.  Completed antibiotic therapy 5/29 prior to discharge.   Possible cholecystitis with cholelithiasis - General Surgery consulted.  S/p laparoscopic, converted to open cholecystectomy 5/23.  Referral sent for outpatient follow-up.   ESRD - HD  MWF.  Right IJ tunneled HD cath replaced 5/21.  Patient to resume her normal dialysis regimen.   Hypertension - Amlodipine  on board.     Anemia of chronic disease - Stable.  No active bleeding appreciated.   Thyroid  nodule - Noted incidentally on CT scan.  Outpatient follow-up recommended.   Consultants: Nephrology, general surgery Procedures performed: Laparoscopic converted to open cholecystectomy Disposition: Home health Diet recommendation:  Discharge Diet Orders (From admission, onward)     Start     Ordered   01/16/24 0000  Diet - low sodium heart healthy        01/16/24 0952           Renal diet  DISCHARGE MEDICATION: Allergies as of 01/16/2024       Reactions   Penicillins Shortness Of Breath, Other (See Comments)   Caused yeast infection Has patient had a PCN reaction causing immediate rash, facial/tongue/throat swelling, SOB or lightheadedness with hypotension: Yes Has patient had a PCN reaction causing severe rash involving mucus membranes or skin necrosis: No Has patient had a PCN reaction that required hospitalization pt was in the hospital at the time of the reaction Has patient had a PCN reaction occurring within the last 10 years: No If all of the above answers are "NO", then may proceed with Cephalosp   Ultram  [tramadol ] Other (See Comments)   Made her tongue raw        Medication List     STOP taking these medications    doxycycline  100 MG capsule Commonly known as: VIBRAMYCIN        TAKE these medications    albuterol  108 (90 Base) MCG/ACT inhaler Commonly known as: VENTOLIN  HFA Inhale 2 puffs into the  lungs every 4 (four) hours as needed for wheezing or shortness of breath.   albuterol  108 (90 Base) MCG/ACT inhaler Commonly known as: VENTOLIN  HFA Inhale 1-2 puffs into the lungs every 6 (six) hours as needed for wheezing or shortness of breath.   amLODipine  10 MG tablet Commonly known as: NORVASC  Take 1 tablet (10 mg total) by  mouth daily.   Aspirin  Low Dose 81 MG tablet Generic drug: aspirin  EC Take 1 tablet (81 mg total) by mouth daily. Swallow whole.   atorvastatin  80 MG tablet Commonly known as: LIPITOR  Take 1 tablet (80 mg total) by mouth daily.   budesonide -formoterol  160-4.5 MCG/ACT inhaler Commonly known as: SYMBICORT Inhale 2 puffs into the lungs in the morning and at bedtime.   cetirizine 10 MG tablet Commonly known as: ZYRTEC Take 10 mg by mouth daily as needed for allergies.   Darbepoetin Alfa  60 MCG/0.3ML Sosy injection Commonly known as: ARANESP  Inject 0.3 mLs (60 mcg total) into the skin every Thursday at 6pm.   Eszopiclone 3 MG Tabs Take 3 mg by mouth at bedtime. Take immediately before bedtime   fluconazole  200 MG tablet Commonly known as: DIFLUCAN  Take 1 tablet (200 mg total) by mouth at bedtime.   gabapentin  100 MG capsule Commonly known as: NEURONTIN  Take 1 capsule (100 mg total) by mouth 2 (two) times daily.   glucagon  1 MG injection Follow package directions for low blood sugar.   guaiFENesin -dextromethorphan 100-10 MG/5ML syrup Commonly known as: ROBITUSSIN DM Take 5 mLs by mouth every 4 (four) hours as needed for cough.   lidocaine  5 % Commonly known as: Lidoderm  Place 1 patch onto the skin daily. Remove & Discard patch within 12 hours or as directed by MD   naloxone  4 MG/0.1ML Liqd nasal spray kit Commonly known as: NARCAN  If concern for opioid overdose, spray in nostril. Call 911.   naphazoline-pheniramine 0.025-0.3 % ophthalmic solution Commonly known as: NAPHCON-A Place 2 drops into both eyes 2 (two) times daily.   nitroGLYCERIN  0.4 MG SL tablet Commonly known as: NITROSTAT  Place 1 tablet (0.4 mg total) under the tongue every 5 (five) minutes as needed for chest pain.   oxyCODONE -acetaminophen  5-325 MG tablet Commonly known as: PERCOCET/ROXICET Take 1 tablet by mouth every 8 (eight) hours as needed for severe pain (pain score 7-10).   pantoprazole  40  MG tablet Commonly known as: PROTONIX  Take 1 tablet (40 mg total) by mouth daily at 12 noon. What changed: when to take this   promethazine -dextromethorphan 6.25-15 MG/5ML syrup Commonly known as: PROMETHAZINE -DM Take 5 mLs by mouth 4 (four) times daily as needed for cough.   triamcinolone  ointment 0.1 % Commonly known as: KENALOG  1 Application 2 (two) times daily as needed.        Follow-up Information     Cannon Champion, MD. Go on 02/13/2024.   Specialty: General Surgery Why: 10 AM for post-op follow up and staple removal. Please arrive 30 min prior to appointment time to get checked in. Contact information: [address] [place] [address] [phone]                 Discharge Exam: Cleavon Curls Weights   01/15/24 1600 01/15/24 2001 01/16/24 0446  Weight: (S) 75 kg (S) 73.6 kg 74.2 kg    GENERAL:  Alert, pleasant, no acute distress  HEENT:  EOMI CARDIOVASCULAR:  RRR, no murmurs appreciated RESPIRATORY:  Clear to auscultation, no wheezing, rales, or rhonchi GASTROINTESTINAL:  Soft, nontender, nondistended EXTREMITIES:  No LE edema bilaterally NEURO:  No new focal deficits appreciated SKIN:  No rashes noted PSYCH:  Appropriate mood and affect    Condition at discharge: improving  The results of significant diagnostics from this hospitalization (including imaging, microbiology, ancillary and laboratory) are listed below for reference.   Imaging Studies: ECHO TEE Result Date: 01/09/2024    TRANSESOPHOGEAL ECHO REPORT   Patient Name:   JAILIN MANOCCHIO Date of Exam: 01/09/2024 Medical Rec #:  [id number]               Height:       64.0 in Accession #:    [id number]              Weight:       168.7 lb Date of Birth:  [birth year]               BSA:          1.820 m Patient Age:    63 years                BP:           170/81 mmHg Patient Gender: F                       HR:           75 bpm. Exam Location:  Inpatient Procedure: Transesophageal  Echo, Cardiac Doppler and Color Doppler (Both            Spectral and Color Flow Doppler were utilized during procedure). Indications:     endocarditis  History:         Patient has prior history of Echocardiogram examinations, most                  recent 01/05/2024. CHF, CAD, COPD and End stage renal disease;                  Risk Factors:Hypertension and Diabetes.  Sonographer:     Kip Peon RDCS Referring Phys:  [id number] Dellis Fermo Diagnosing Phys: Dinah Franco MD PROCEDURE: After discussion of the risks and benefits of a TEE, an informed consent was obtained from the patient. The transesophogeal probe was passed without difficulty through the esophogus of the patient. Imaged were obtained with the patient in a left lateral decubitus position. Sedation performed by different physician. The patient was monitored while under deep sedation. Anesthestetic sedation was provided intravenously by Anesthesiology: 170mg  of Propofol . The patient developed no complications during the procedure.  IMPRESSIONS  1. Left ventricular ejection fraction, by estimation, is 70 to 75%. The left ventricle has hyperdynamic function. There is mild left ventricular hypertrophy.  2. Right ventricular systolic function is normal. The right ventricular size is normal.  3. Left atrial size was mildly dilated. No left atrial/left atrial appendage thrombus was detected.  4. Right atrial size was mildly dilated.  5. The mitral valve is grossly normal. Mild mitral valve regurgitation.  6. The aortic valve is tricuspid. Aortic valve regurgitation is not visualized. Conclusion(s)/Recommendation(s): No evidence of vegetation/infective endocarditis on this transesophageael echocardiogram. FINDINGS  Left Ventricle: Left ventricular ejection fraction, by estimation, is 70 to 75%. The left ventricle has hyperdynamic function. The left ventricular internal cavity size was normal in size. There is mild left ventricular hypertrophy. Right Ventricle:  The right ventricular size is normal. No increase in right ventricular wall thickness. Right ventricular systolic function is normal. Left Atrium: Left atrial size  was mildly dilated. No left atrial/left atrial appendage thrombus was detected. Right Atrium: Right atrial size was mildly dilated. Pericardium: Trivial pericardial effusion is present. The pericardial effusion is circumferential. Mitral Valve: The mitral valve is grossly normal. Mild mitral valve regurgitation. Tricuspid Valve: The tricuspid valve is grossly normal. Tricuspid valve regurgitation is mild. Aortic Valve: The aortic valve is tricuspid. Aortic valve regurgitation is not visualized. Pulmonic Valve: The pulmonic valve was grossly normal. Pulmonic valve regurgitation is trivial. Aorta: The aortic root and ascending aorta are structurally normal, with no evidence of dilitation. IAS/Shunts: No atrial level shunt detected by color flow Doppler. Additional Comments: Spectral Doppler performed. LEFT VENTRICLE PLAX 2D LVOT diam:     2.10 cm LVOT Area:     3.46 cm   AORTA Ao Asc diam: 3.60 cm  SHUNTS Systemic Diam: 2.10 cm Dinah Franco MD Electronically signed by Dinah Franco MD Signature Date/Time: 01/09/2024/2:15:29 PM    Final    EP STUDY Result Date: 01/09/2024 See surgical note for result.  IR Fluoro Guide CV Line Right Result Date: 01/08/2024 INDICATION: 63 year old female with history of end-stage renal disease on hemodialysis presenting for replacement of tunneled hemodialysis catheter after line holiday. EXAM: TUNNELED CENTRAL VENOUS HEMODIALYSIS CATHETER PLACEMENT WITH ULTRASOUND AND FLUOROSCOPIC GUIDANCE MEDICATIONS: Vancomycin 1 gm IV . The antibiotic was given in an appropriate time interval prior to skin puncture. ANESTHESIA/SEDATION: Moderate (conscious) sedation was employed during this procedure. A total of Versed  1.5 mg and Fentanyl  50 mcg was administered intravenously. Moderate Sedation Time: 11 minutes. The patient's level  of consciousness and vital signs were monitored continuously by radiology nursing throughout the procedure under my direct supervision. FLUOROSCOPY TIME:  Nine mGy reference air kerma COMPLICATIONS: None immediate. PROCEDURE: Informed written consent was obtained from the patient after a discussion of the risks, benefits, and alternatives to treatment. Questions regarding the procedure were encouraged and answered. The right neck and chest were prepped with chlorhexidine  in a sterile fashion, and a sterile drape was applied covering the operative field. Maximum barrier sterile technique with sterile gowns and gloves were used for the procedure. A timeout was performed prior to the initiation of the procedure. After creating a small venotomy incision, a 21 gauge micropuncture kit was utilized to access the internal jugular vein. Real-time ultrasound guidance was utilized for vascular access including the acquisition of a permanent ultrasound image documenting patency of the accessed vessel. A Rosen wire was advanced to the level of the IVC and the micropuncture sheath was exchanged for an 8 Fr dilator. A 14.5 French tunneled hemodialysis catheter measuring 23 cm from tip to cuff was tunneled in a retrograde fashion from the anterior chest wall to the venotomy incision. Serial dilation was then performed an a peel-away sheath was placed. The catheter was then placed through the peel-away sheath with the catheter tip ultimately positioned within the right atrium. Final catheter positioning was confirmed and documented with a spot radiographic image. The catheter aspirates and flushes normally. The catheter was flushed with appropriate volume heparin  dwells. The catheter exit site was secured with a 0-Silk retention suture. The venotomy incision was closed with Dermabond. Sterile dressings were applied. The patient tolerated the procedure well without immediate post procedural complication. IMPRESSION: Successful  placement of 23 cm tip to cuff tunneled hemodialysis catheter via the right internal jugular vein with catheter tip terminating within the right atrium. The catheter is ready for immediate use. Creasie Doctor, MD [facility] Electronically Signed  By: Creasie Doctor M.D.   On: 01/08/2024 12:15   IR US  Guide Vasc Access Right Result Date: 01/08/2024 INDICATION: 63 year old female with history of end-stage renal disease on hemodialysis presenting for replacement of tunneled hemodialysis catheter after line holiday. EXAM: TUNNELED CENTRAL VENOUS HEMODIALYSIS CATHETER PLACEMENT WITH ULTRASOUND AND FLUOROSCOPIC GUIDANCE MEDICATIONS: Vancomycin  1 gm IV . The antibiotic was given in an appropriate time interval prior to skin puncture. ANESTHESIA/SEDATION: Moderate (conscious) sedation was employed during this procedure. A total of Versed  1.5 mg and Fentanyl  50 mcg was administered intravenously. Moderate Sedation Time: 11 minutes. The patient's level of consciousness and vital signs were monitored continuously by radiology nursing throughout the procedure under my direct supervision. FLUOROSCOPY TIME:  Nine mGy reference air kerma COMPLICATIONS: None immediate. PROCEDURE: Informed written consent was obtained from the patient after a discussion of the risks, benefits, and alternatives to treatment. Questions regarding the procedure were encouraged and answered. The right neck and chest were prepped with chlorhexidine  in a sterile fashion, and a sterile drape was applied covering the operative field. Maximum barrier sterile technique with sterile gowns and gloves were used for the procedure. A timeout was performed prior to the initiation of the procedure. After creating a small venotomy incision, a 21 gauge micropuncture kit was utilized to access the internal jugular vein. Real-time ultrasound guidance was utilized for vascular access including the acquisition of  a permanent ultrasound image documenting patency of the accessed vessel. A Rosen wire was advanced to the level of the IVC and the micropuncture sheath was exchanged for an 8 Fr dilator. A 14.5 French tunneled hemodialysis catheter measuring 23 cm from tip to cuff was tunneled in a retrograde fashion from the anterior chest wall to the venotomy incision. Serial dilation was then performed an a peel-away sheath was placed. The catheter was then placed through the peel-away sheath with the catheter tip ultimately positioned within the right atrium. Final catheter positioning was confirmed and documented with a spot radiographic image. The catheter aspirates and flushes normally. The catheter was flushed with appropriate volume heparin  dwells. The catheter exit site was secured with a 0-Silk retention suture. The venotomy incision was closed with Dermabond. Sterile dressings were applied. The patient tolerated the procedure well without immediate post procedural complication. IMPRESSION: Successful placement of 23 cm tip to cuff tunneled hemodialysis catheter via the right internal jugular vein with catheter tip terminating within the right atrium. The catheter is ready for immediate use. Creasie Doctor, MD [facility] Electronically Signed   By: Creasie Doctor M.D.   On: 01/08/2024 12:15   IR Removal Tun Cv Cath W/O FL Result Date: 01/06/2024 INDICATION: End-stage renal disease on hemodialysis. Candidemia. Request removal of tunneled HD catheter. Catheter originally placed by surgical service 07/2023 EXAM: REMOVAL OF TUNNELED RIGHT IJ HEMODIALYSIS CATHETER MEDICATIONS: 1% plain lidocaine , 8 mL COMPLICATIONS: None immediate. PROCEDURE: Informed written consent was obtained from the patient following an explanation of the procedure, risks, benefits and alternatives to treatment. A time out was performed prior to the initiation of the procedure. Maximal barrier  sterile technique was utilized including caps, mask, sterile gowns, sterile gloves, large sterile drape, hand hygiene, and chlorhexidine . 1% lidocaine  was injected under sterile conditions along the subcutaneous tunnel. Utilizing a combination of blunt dissection and gentle traction, the cuff of the catheter was exposed and the catheter was removed intact. Hemostasis was obtained with manual compression. A dressing was placed. The patient tolerated the procedure well without  immediate post procedural complication. IMPRESSION: Successful removal of tunneled right IJ hemodialysis catheter. Procedure performed by Prudence Brown PA-C supervised by Dr. Lowanda Ruddy Electronically Signed   By: Erica Hau M.D.   On: 01/06/2024 09:37   ECHOCARDIOGRAM COMPLETE Result Date: 01/05/2024    ECHOCARDIOGRAM REPORT   Patient Name:   TANECIA MCCAY Date of Exam: 01/05/2024 Medical Rec #:  [id number]               Height:       64.0 in Accession #:    [id number]              Weight:       147.0 lb Date of Birth:  August 12, 1961               BSA:          1.716 m Patient Age:    63 years                BP:           142/86 mmHg Patient Gender: F                       HR:           66 bpm. Exam Location:  Inpatient Procedure: 2D Echo, Cardiac Doppler, Color Doppler and Intracardiac            Opacification Agent (Both Spectral and Color Flow Doppler were            utilized during procedure). Indications:    Endocarditis  History:        Patient has prior history of Echocardiogram examinations, most                 recent 08/02/2023. Risk Factors:Diabetes and Hypertension.  Sonographer:    Janette Medley Referring Phys: [id number] Alica Inks IMPRESSIONS  1. Left ventricular ejection fraction, by estimation, is 70 to 75%. The left ventricle has hyperdynamic function. The left ventricle has no regional wall motion abnormalities. There is mild concentric left ventricular hypertrophy. Left ventricular diastolic  parameters are indeterminate.  2. Right ventricular systolic function is normal. The right ventricular size is normal.  3. Left atrial size was mildly dilated.  4. Right atrial size was mildly dilated.  5. The mitral valve is abnormal. Mild mitral valve regurgitation. No evidence of mitral stenosis.  6. The aortic valve is normal in structure. Aortic valve regurgitation is not visualized. No aortic stenosis is present.  7. The inferior vena cava is dilated in size with >50% respiratory variability, suggesting right atrial pressure of 8 mmHg. Conclusion(s)/Recommendation(s): No evidence of valvular vegetations on this transthoracic echocardiogram. Consider a transesophageal echocardiogram to exclude infective endocarditis if clinically indicated. FINDINGS  Left Ventricle: Left ventricular ejection fraction, by estimation, is 70 to 75%. The left ventricle has hyperdynamic function. The left ventricle has no regional wall motion abnormalities. The left ventricular internal cavity size was normal in size. There is mild concentric left ventricular hypertrophy. Left ventricular diastolic parameters are indeterminate. Right Ventricle: The right ventricular size is normal. No increase in right ventricular wall thickness. Right ventricular systolic function is normal. Left Atrium: Left atrial size was mildly dilated. Right Atrium: Right atrial size was mildly dilated. Pericardium: There is no evidence of pericardial effusion. Mitral Valve: The mitral valve is abnormal. There is mild calcification of the mitral valve leaflet(s). Mild mitral annular calcification. Mild mitral valve regurgitation. No evidence of  mitral valve stenosis. Tricuspid Valve: The tricuspid valve is normal in structure. Tricuspid valve regurgitation is trivial. No evidence of tricuspid stenosis. Aortic Valve: The aortic valve is normal in structure. Aortic valve regurgitation is not visualized. No aortic stenosis is present. Pulmonic Valve: The pulmonic  valve was normal in structure. Pulmonic valve regurgitation is not visualized. No evidence of pulmonic stenosis. Aorta: The aortic root is normal in size and structure. Venous: The inferior vena cava is dilated in size with greater than 50% respiratory variability, suggesting right atrial pressure of 8 mmHg. IAS/Shunts: No atrial level shunt detected by color flow Doppler.  LEFT VENTRICLE PLAX 2D LVIDd:         4.30 cm   Diastology LVIDs:         2.80 cm   LV e' medial:    8.59 cm/s LV PW:         1.00 cm   LV E/e' medial:  16.8 LV IVS:        1.10 cm   LV e' lateral:   8.38 cm/s LVOT diam:     1.80 cm   LV E/e' lateral: 17.2 LV SV:         83 LV SV Index:   48 LVOT Area:     2.54 cm  RIGHT VENTRICLE             IVC RV S prime:     16.10 cm/s  IVC diam: 1.80 cm TAPSE (M-mode): 2.2 cm LEFT ATRIUM             Index        RIGHT ATRIUM           Index LA diam:        4.50 cm 2.62 cm/m   RA Area:     17.20 cm LA Vol (A2C):   61.8 ml 36.01 ml/m  RA Volume:   51.20 ml  29.83 ml/m LA Vol (A4C):   54.8 ml 31.93 ml/m LA Biplane Vol: 59.7 ml 34.78 ml/m  AORTIC VALVE LVOT Vmax:   150.00 cm/s LVOT Vmean:  103.000 cm/s LVOT VTI:    0.325 m  AORTA Ao Root diam: 2.60 cm Ao Asc diam:  3.10 cm MITRAL VALVE MV Area (PHT): 4.39 cm     SHUNTS MV Decel Time: 173 msec     Systemic VTI:  0.32 m MV E velocity: 144.00 cm/s  Systemic Diam: 1.80 cm MV A velocity: 72.80 cm/s MV E/A ratio:  1.98 Jules Oar MD Electronically signed by Jules Oar MD Signature Date/Time: 01/05/2024/3:51:08 PM    Final    US  Abdomen Limited RUQ (LIVER/GB) Result Date: 01/04/2024 CLINICAL DATA:  Abnormal CT EXAM: ULTRASOUND ABDOMEN LIMITED RIGHT UPPER QUADRANT COMPARISON:  CT abdomen and pelvis 01/03/2024 FINDINGS: Gallbladder: Gallstones are present measuring up to 1.6 cm. Pericholecystic fluid is present. The gallbladder wall thickened measuring 10 mm. Sonographic Abigail Abler sign is negative per sonographer. Common bile duct: Diameter: 5.5 mm.  Liver: No focal lesion identified. There is lobulated liver contour. Within normal limits in parenchymal echogenicity. Portal vein is patent on color Doppler imaging with normal direction of blood flow towards the liver. Other: None. IMPRESSION: 1. Cholelithiasis with gallbladder wall thickening and pericholecystic fluid. Findings are concerning for acute cholecystitis. 2. Lobulated liver contour, which can be seen in the setting of cirrhosis. Electronically Signed   By: Tyron Gallon M.D.   On: 01/04/2024 22:28   US  THYROID  Result Date: 01/04/2024 CLINICAL DATA:  Nodule on CT chest EXAM: THYROID  ULTRASOUND TECHNIQUE: Ultrasound examination of the thyroid  gland and adjacent soft tissues was performed. COMPARISON:  CT 01/03/2024 FINDINGS: Parenchymal Echotexture: Mildly heterogenous Isthmus: 0.8 cm thickness Right lobe: 4 x 1.2 x 2.1 cm Left lobe: _________________________________________________________ Estimated total number of nodules >/= 1 cm: 3 Number of spongiform nodules >/=  2 cm not described below (TR1): 0 Number of mixed cystic and solid nodules >/= 1.5 cm not described below (TR2): 0 _________________________________________________________ Nodule # 1: Location: Right; inferior Maximum size: 1.6 cm; Other 2 dimensions: 1.2 x 1.5 cm Composition: mixed cystic and solid (1) Echogenicity: isoechoic (1) Shape: not taller-than-wide (0) Margins: ill-defined (0) Echogenic foci: none (0) ACR TI-RADS total points: 2. ACR TI-RADS risk category: TR 2. ACR TI-RADS recommendations: This nodule does NOT meet TI-RADS criteria for biopsy or dedicated follow-up. _________________________________________________________ Nodule # 2: 0.9 cm hypoechoic nodule without calcifications, mid right; This nodule does NOT meet TI-RADS criteria for biopsy or dedicated follow-up. Nodule # 3: Location: Left; mid Maximum size: 3.6 cm; Other 2 dimensions: 2.7 x 2.9 cm Composition: mixed cystic and solid (1) Echogenicity: hyperechoic (1)  Shape: taller-than-wide (3) Margins: ill-defined (0) Echogenic foci: none (0) ACR TI-RADS total points: 5. ACR TI-RADS risk category: TR 4. ACR TI-RADS recommendations: **Given size (>/= 1.5 cm) and appearance, fine needle aspiration of this moderately suspicious nodule should be considered based on TI-RADS criteria. _________________________________________________________ Nodule # 4: Location: Left; inferior Maximum size: 1.2 cm; Other 2 dimensions: 1 x 1.1 cm Composition: solid/almost completely solid (2) Echogenicity: isoechoic (1) Shape: not taller-than-wide (0) Margins: ill-defined (0) Echogenic foci: none (0) ACR TI-RADS total points: 3. ACR TI-RADS risk category: TR 3. ACR TI-RADS recommendations: Given size (<1.4 cm) and appearance, this nodule does NOT meet TI-RADS criteria for biopsy or dedicated follow-up. _________________________________________________________ No regional cervical adenopathy. IMPRESSION: 3.6 cm left mid thyroid  nodule, meeting criteria for FNA. Electronically Signed   By: Nicoletta Barrier M.D.   On: 01/04/2024 16:26   MR LUMBAR SPINE WO CONTRAST Result Date: 01/04/2024 CLINICAL DATA:  63 year old female with end-stage renal disease. Fevers and chills, diarrhea, possible sepsis. Back pain. EXAM: MRI LUMBAR SPINE WITHOUT CONTRAST TECHNIQUE: Multiplanar, multisequence MR imaging of the lumbar spine was performed. No intravenous contrast was administered. COMPARISON:  CT Abdomen and Pelvis yesterday. Lumbar MRI 02/01/2014. FINDINGS: Segmentation:  Normal on the CT yesterday. Alignment:  Stable lumbar lordosis. Vertebrae: L2 inferior endplate Schmorl's node, new since 2015 but well documented on the CT yesterday with adjacent L2-L3 vacuum disc. Maintained lumbar vertebral height. 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 No suspicious marrow edema identified.  Visible sacrum  intact. Conus medullaris and cauda equina: Conus extends to the T12-L1 level. No lower spinal cord or conus signal abnormality. Capacious spinal canal at most levels. Negative cauda equina nerve roots. Paraspinal and other soft tissues: Stable to the CT Abdomen and Pelvis yesterday, including posterior subcutaneous edema. No suspicious paraspinal inflammation. Disc levels: Capacious spinal canal at most levels. Age-appropriate lumbar spine degeneration at most levels. Conspicuous disc degeneration at L2-L3 (with trace vacuum disc yesterday and associated L2 Schmorl's node), L5-S1. No lumbar spinal stenosis. IMPRESSION: 1. No evidence of Lumbar discitis osteomyelitis. An L2 inferior endplate Schmorl's node is noted and was well demonstrated by CT yesterday. 2. Mild for age lumbar spine degeneration and capacious underlying spinal canal, no lumbar spinal stenosis. 3.  Hemosiderosis. Electronically Signed   By: Marlise Simpers M.D.   On: 01/04/2024 04:38   CT ABDOMEN PELVIS WO CONTRAST Result Date: 01/03/2024 CLINICAL DATA:  Fever EXAM: CT ABDOMEN AND PELVIS WITHOUT CONTRAST TECHNIQUE: Multidetector CT imaging of the abdomen and pelvis was performed following the standard protocol without IV contrast. RADIATION DOSE REDUCTION: This exam was performed according to the departmental dose-optimization program which includes automated exposure control, adjustment of the mA and/or kV according to patient size and/or use of iterative reconstruction technique. COMPARISON:  CT 01/15/2020 FINDINGS: Lower chest: See separately dictated chest CT Hepatobiliary: Gallstones. Gallbladder wall thickening versus trace pericholecystic fluid. No biliary dilatation. Possible surface nodularity of the liver Pancreas: Unremarkable. No pancreatic ductal dilatation or surrounding inflammatory changes. Spleen: Normal in size without focal abnormality. Adrenals/Urinary Tract: Stable adrenal glands. No hydronephrosis. Nonspecific left greater than  right perinephric stranding. The bladder is slightly thick walled. Stomach/Bowel: The stomach is nonenlarged. Scattered fluid-filled small bowel without definitive obstruction. No acute bowel wall thickening. Diverticular disease of the colon Vascular/Lymphatic: Aortic atherosclerosis without aneurysm. Borderline Peri aortic lymph nodes measuring up to 11 mm. Reproductive: Possible uterine fibroids.  No suspicious adnexal mass Other: Negative for pelvic effusion or free air Musculoskeletal: No acute or suspicious osseous abnormality IMPRESSION: 1. Gallstones with gallbladder wall thickening versus trace pericholecystic fluid. Follow-up right upper quadrant ultrasound as indicated. 2. Diverticular disease of the colon without acute bowel wall thickening. 3. Nonspecific left greater than right perinephric stranding. Slight bladder wall thickening, correlate with urinalysis to exclude cystitis and ascending urinary tract infection. 4. Possible surface nodularity/liver cirrhosis 5. Aortic atherosclerosis. Aortic Atherosclerosis (ICD10-I70.0). Electronically Signed   By: Esmeralda Hedge M.D.   On: 01/03/2024 22:44   CT CHEST WO CONTRAST Result Date: 01/03/2024 CLINICAL DATA:  Chest pain.  Respiratory illness. EXAM: CT CHEST WITHOUT CONTRAST TECHNIQUE: Multidetector CT imaging of the chest was performed following the standard protocol without IV contrast. RADIATION DOSE REDUCTION: This exam was performed according to the departmental dose-optimization program which includes automated exposure control, adjustment of the mA and/or kV according to patient size and/or use of iterative reconstruction technique. COMPARISON:  January 22, 2022. FINDINGS: Cardiovascular: Mild cardiomegaly. No pericardial effusion. Right internal jugular dialysis catheter is noted with distal tip at cavoatrial junction. Atherosclerosis of thoracic aorta without aneurysm formation. Mediastinum/Nodes: 3.4 cm left thyroid  nodule is noted. Esophagus is  unremarkable. No adenopathy. Lungs/Pleura: No pneumothorax or pleural effusion is noted. Mild peripheral septal thickening is noted bilaterally, right greater than left, concerning for possible edema or atypical inflammation. Upper Abdomen: No acute abnormality. Musculoskeletal: Probable subacute fractures are seen involving the posterior portions of the left sixth, seventh and eighth ribs. Probable subacute right seventh rib fracture is noted as well. IMPRESSION: Mild peripheral septal thickening is noted in both lungs, right greater than left, concerning for possible edema or atypical inflammation. 3.4 cm left thyroid  nodule is noted. Recommend thyroid  US . (Ref: J Am Coll Radiol. 2015 Feb;12(2): 143-50). Multiple bilateral subacute rib fractures are noted. Aortic Atherosclerosis (ICD10-I70.0). Electronically Signed   By: Rosalene Colon M.D.   On: 01/03/2024 15:36   DG Chest Portable 1 View Result Date: 01/03/2024 CLINICAL DATA:  Chest pain EXAM: PORTABLE CHEST 1 VIEW COMPARISON:  Chest radiograph dated 12/10/2023 FINDINGS: Lines/tubes: Right internal jugular venous catheter tip projects over the superior cavoatrial junction. Lungs: Patient is rotated to the right. Bibasilar patchy opacities. Similar chronic-appearing perihilar pulmonary vasculature. Pleura: No pneumothorax or pleural effusion. Heart/mediastinum: Similar enlarged cardiomediastinal  silhouette. Bones: No acute osseous abnormality. IMPRESSION: 1. Bibasilar patchy opacities, which may represent atelectasis, aspiration, or pneumonia. 2. Similar cardiomegaly. Electronically Signed   By: Limin  Xu M.D.   On: 01/03/2024 14:18    Microbiology: Results for orders placed or performed during the hospital encounter of 01/03/24  Resp panel by RT-PCR (RSV, Flu A&B, Covid) Anterior Nasal Swab     Status: None   Collection Time: 01/03/24  1:33 PM   Specimen: Anterior Nasal Swab  Result Value Ref Range Status   SARS Coronavirus 2 by RT PCR NEGATIVE  NEGATIVE Final   Influenza A by PCR NEGATIVE NEGATIVE Final   Influenza B by PCR NEGATIVE NEGATIVE Final    Comment: (NOTE) The Xpert Xpress SARS-CoV-2/FLU/RSV plus assay is intended as an aid in the diagnosis of influenza from Nasopharyngeal swab specimens and should not be used as a sole basis for treatment. Nasal washings and aspirates are unacceptable for Xpert Xpress SARS-CoV-2/FLU/RSV testing.  Fact Sheet for Patients: BloggerCourse.com  Fact Sheet for Healthcare Providers: SeriousBroker.it  This test is not yet approved or cleared by the [place]  FDA and has been authorized for detection and/or diagnosis of SARS-CoV-2 by FDA under an Emergency Use Authorization (EUA). This EUA will remain in effect (meaning this test can be used) for the duration of the COVID-19 declaration under Section 564(b)(1) of the Act, 21 U.S.C. section 360bbb-3(b)(1), unless the authorization is terminated or revoked.     Resp Syncytial Virus by PCR NEGATIVE NEGATIVE Final    Comment: (NOTE) Fact Sheet for Patients: BloggerCourse.com  Fact Sheet for Healthcare Providers: SeriousBroker.it  This test is not yet approved or cleared by the [place]  FDA and has been authorized for detection and/or diagnosis of SARS-CoV-2 by FDA under an Emergency Use Authorization (EUA). This EUA will remain in effect (meaning this test can be used) for the duration of the COVID-19 declaration under Section 564(b)(1) of the Act, 21 U.S.C. section 360bbb-3(b)(1), unless the authorization is terminated or revoked.  Performed at [facility], [address], [place], [place] [address]   Blood culture (routine x 2)     Status: Abnormal   Collection Time: 01/03/24  2:59 PM   Specimen: BLOOD RIGHT FOREARM  Result Value Ref Range Status   Specimen Description BLOOD RIGHT FOREARM  Final   Special  Requests   Final    BOTTLES DRAWN AEROBIC AND ANAEROBIC Blood Culture adequate volume   Culture  Setup Time   Final    YEAST IN BOTH AEROBIC AND ANAEROBIC BOTTLES CRITICAL RESULT CALLED TO, READ BACK BY AND VERIFIED WITH: PHARMD AUSTIN PAYTES [id number] 1938 BY J RAZZAK, MT    Culture (A)  Final    CANDIDA TROPICALIS Sent to [facility] for further susceptibility testing. SEE SEPARATE REPORT Performed at [facility], [address], [place], [place] [address]    Report Status 01/10/2024 FINAL  Final  Blood Culture ID Panel (Reflexed)     Status: Abnormal   Collection Time: 01/03/24  2:59 PM  Result Value Ref Range Status   Enterococcus faecalis NOT DETECTED NOT DETECTED Final   Enterococcus Faecium NOT DETECTED NOT DETECTED Final   Listeria monocytogenes NOT DETECTED NOT DETECTED Final   Staphylococcus species NOT DETECTED NOT DETECTED Final   Staphylococcus aureus (BCID) NOT DETECTED NOT DETECTED Final   Staphylococcus epidermidis NOT DETECTED NOT DETECTED Final   Staphylococcus lugdunensis NOT DETECTED NOT DETECTED Final   Streptococcus species NOT DETECTED NOT DETECTED  Final   Streptococcus agalactiae NOT DETECTED NOT DETECTED Final   Streptococcus pneumoniae NOT DETECTED NOT DETECTED Final   Streptococcus pyogenes NOT DETECTED NOT DETECTED Final   A.calcoaceticus-baumannii NOT DETECTED NOT DETECTED Final   Bacteroides fragilis NOT DETECTED NOT DETECTED Final   Enterobacterales NOT DETECTED NOT DETECTED Final   Enterobacter cloacae complex NOT DETECTED NOT DETECTED Final   Escherichia coli NOT DETECTED NOT DETECTED Final   Klebsiella aerogenes NOT DETECTED NOT DETECTED Final   Klebsiella oxytoca NOT DETECTED NOT DETECTED Final   Klebsiella pneumoniae NOT DETECTED NOT DETECTED Final   Proteus species NOT DETECTED NOT DETECTED Final   Salmonella species NOT DETECTED NOT DETECTED Final   Serratia marcescens NOT DETECTED NOT DETECTED Final   Haemophilus influenzae NOT DETECTED  NOT DETECTED Final   Neisseria meningitidis NOT DETECTED NOT DETECTED Final   Pseudomonas aeruginosa NOT DETECTED NOT DETECTED Final   Stenotrophomonas maltophilia NOT DETECTED NOT DETECTED Final   Candida albicans NOT DETECTED NOT DETECTED Final   Candida auris NOT DETECTED NOT DETECTED Final   Candida glabrata NOT DETECTED NOT DETECTED Final   Candida krusei NOT DETECTED NOT DETECTED Final   Candida parapsilosis NOT DETECTED NOT DETECTED Final   Candida tropicalis DETECTED (A) NOT DETECTED Final    Comment: CRITICAL RESULT CALLED TO, READ BACK BY AND VERIFIED WITH: PHARMD AUSTIN PAYTES [id number] 1938 BY J RAZZAK, MT    Cryptococcus neoformans/gattii NOT DETECTED NOT DETECTED Final    Comment: Performed at [facility], [address], [place], [place] [address]  Yeast Susceptibilities     Status: None (Preliminary result)   Collection Time: 01/03/24  2:59 PM  Result Value Ref Range Status   SOURCE PENDING  Incomplete   Organism ID, Yeast Candida tropicalis  Final    Comment: (NOTE) Identification performed by account, not confirmed by this laboratory.    Amphotericin B MIC 1.0 ug/mL  Final    Comment: (NOTE) Breakpoints have been established for only some organism-drug combinations as indicated. This test was developed and its performance characteristics determined by [facility]. It has not been cleared or approved by the Food and Drug Administration.    Anidulafungin MIC Comment  Final    Comment: (NOTE) 0.12 ug/mL Susceptible Breakpoints have been established for only some organism-drug combinations as indicated. This test was developed and its performance characteristics determined by [facility]. It has not been cleared or approved by the Food and Drug Administration.    Caspofungin MIC Comment  Final    Comment: (NOTE) 0.25 ug/mL Susceptible Breakpoints have been established for only some organism-drug combinations as indicated. This test was developed and its  performance characteristics determined by [facility]. It has not been cleared or approved by the Food and Drug Administration.    Fluconazole Islt MIC 2.0 ug/mL Susceptible  Final    Comment: (NOTE) Breakpoints have been established for only some organism-drug combinations as indicated. This test was developed and its performance characteristics determined by [facility]. It has not been cleared or approved by the Food and Drug Administration.    ISAVUCONAZOLE MIC 0.12 ug/mL  Final    Comment: (NOTE) This test was developed and its performance characteristics determined by [facility]. It has not been cleared or approved by the Food and Drug Administration.    Itraconazole MIC 0.25 ug/mL  Final    Comment: (NOTE) Breakpoints have been established for only some organism-drug combinations as indicated. This test was developed and its performance characteristics determined by [facility]. It  has not been cleared or approved by the Food and Drug Administration.    Micafungin  MIC Comment  Final    Comment: (NOTE) 0.06 ug/mL Susceptible Breakpoints have been established for only some organism-drug combinations as indicated. This test was developed and its performance characteristics determined by [facility]. It has not been cleared or approved by the Food and Drug Administration.    Posaconazole MIC 1.0 ug/mL  Final    Comment: (NOTE) Breakpoints have been established for only some organism-drug combinations as indicated. This test was developed and its performance characteristics determined by [facility]. It has not been cleared or approved by the Food and Drug Administration.    REZAFUNGIN MIC Comment  Final    Comment: (NOTE) 0.25 ug/mL Susceptible This test was developed and its performance characteristics determined by [facility]. It has not been cleared or approved by the Food and Drug Administration.    Voriconazole MIC Comment  Final    Comment: (NOTE) 0.5 ug/mL  Intermediate Breakpoints have been established for only some organism-drug combinations as indicated. This test was developed and its performance characteristics determined by [facility]. It has not been cleared or approved by the Food and Drug Administration. Performed At: [facility] [address] [place], [place] [id number] Pearlean Botts MD WU:[id number]   Blood culture (routine x 2)     Status: Abnormal   Collection Time: 01/04/24  5:39 AM   Specimen: BLOOD RIGHT ARM  Result Value Ref Range Status   Specimen Description BLOOD RIGHT ARM  Final   Special Requests   Final    BOTTLES DRAWN AEROBIC AND ANAEROBIC Blood Culture adequate volume   Culture  Setup Time   Final    BUDDING YEAST SEEN AEROBIC BOTTLE ONLY Gram Stain Report Called to,Read Back By and Verified With: PHARMD J LEDFORD 01/06/2024 @ 0059 BY AB Performed at [facility], [address], [place], [place] [address]    Culture CANDIDA TROPICALIS (A)  Final   Report Status 01/07/2024 FINAL  Final  Respiratory (~20 pathogens) panel by PCR     Status: None   Collection Time: 01/04/24 11:35 PM   Specimen: Nasopharyngeal Swab; Respiratory  Result Value Ref Range Status   Adenovirus NOT DETECTED NOT DETECTED Final   Coronavirus 229E NOT DETECTED NOT DETECTED Final    Comment: (NOTE) The Coronavirus on the Respiratory Panel, DOES NOT test for the novel  Coronavirus (2019 nCoV)    Coronavirus HKU1 NOT DETECTED NOT DETECTED Final   Coronavirus NL63 NOT DETECTED NOT DETECTED Final   Coronavirus OC43 NOT DETECTED NOT DETECTED Final   Metapneumovirus NOT DETECTED NOT DETECTED Final   Rhinovirus / Enterovirus NOT DETECTED NOT DETECTED Final   Influenza A NOT DETECTED NOT DETECTED Final   Influenza B NOT DETECTED NOT DETECTED Final   Parainfluenza Virus 1 NOT DETECTED NOT DETECTED Final   Parainfluenza Virus 2 NOT DETECTED NOT DETECTED Final   Parainfluenza Virus 3 NOT DETECTED NOT DETECTED Final    Parainfluenza Virus 4 NOT DETECTED NOT DETECTED Final   Respiratory Syncytial Virus NOT DETECTED NOT DETECTED Final   Bordetella pertussis NOT DETECTED NOT DETECTED Final   Bordetella Parapertussis NOT DETECTED NOT DETECTED Final   Chlamydophila pneumoniae NOT DETECTED NOT DETECTED Final   Mycoplasma pneumoniae NOT DETECTED NOT DETECTED Final    Comment: Performed at [facility], [address] [place], [place] [address]  Culture, blood (Routine X 2) w Reflex to ID Panel     Status:  Abnormal   Collection Time: 01/06/24  8:13 AM   Specimen: BLOOD  Result Value Ref Range Status   Specimen Description BLOOD SITE NOT SPECIFIED  Final   Special Requests   Final    BOTTLES DRAWN AEROBIC AND ANAEROBIC Blood Culture results may not be optimal due to an inadequate volume of blood received in culture bottles   Culture  Setup Time   Final    GRAM NEGATIVE RODS BOTTLES DRAWN AEROBIC ONLY CRITICAL RESULT CALLED TO, READ BACK BY AND VERIFIED WITH: V BRYK,PHARMD@0054  01/09/24 MK    Culture (A)  Final    BURKHOLDERIA SPECIES BURKHOLDERIA CEPACIA GROUP Performed at [facility], [address] [place], [place] [address]    Report Status 01/10/2024 FINAL  Final   Organism ID, Bacteria BURKHOLDERIA SPECIES  Final      Susceptibility   Burkholderia species - MIC*    CEFEPIME >=32 RESISTANT Resistant     GENTAMICIN <=1 SENSITIVE Sensitive     CIPROFLOXACIN  <=0.25 SENSITIVE Sensitive     IMIPENEM 2 SENSITIVE Sensitive     TRIMETH/SULFA <=20 SENSITIVE Sensitive     * BURKHOLDERIA SPECIES  Culture, blood (Routine X 2) w Reflex to ID Panel     Status: Abnormal   Collection Time: 01/06/24  8:13 AM   Specimen: BLOOD  Result Value Ref Range Status   Specimen Description BLOOD SITE NOT SPECIFIED  Final   Special Requests   Final    BOTTLES DRAWN AEROBIC AND ANAEROBIC Blood Culture results may not be optimal due to an inadequate volume of blood received in culture bottles   Culture  Setup  Time   Final    GRAM NEGATIVE RODS ANAEROBIC BOTTLE ONLY CRITICAL VALUE NOTED.  VALUE IS CONSISTENT WITH PREVIOUSLY REPORTED AND CALLED VALUE.    Culture (A)  Final    BURKHOLDERIA SPECIES SUSCEPTIBILITIES PERFORMED ON PREVIOUS CULTURE WITHIN THE LAST 5 DAYS. Performed at [facility], [address], [place], [place] [address]    Report Status 01/11/2024 FINAL  Final  Blood Culture ID Panel (Reflexed)     Status: None   Collection Time: 01/06/24  8:13 AM  Result Value Ref Range Status   Enterococcus faecalis NOT DETECTED NOT DETECTED Final   Enterococcus Faecium NOT DETECTED NOT DETECTED Final   Listeria monocytogenes NOT DETECTED NOT DETECTED Final   Staphylococcus species NOT DETECTED NOT DETECTED Final   Staphylococcus aureus (BCID) NOT DETECTED NOT DETECTED Final   Staphylococcus epidermidis NOT DETECTED NOT DETECTED Final   Staphylococcus lugdunensis NOT DETECTED NOT DETECTED Final   Streptococcus species NOT DETECTED NOT DETECTED Final   Streptococcus agalactiae NOT DETECTED NOT DETECTED Final   Streptococcus pneumoniae NOT DETECTED NOT DETECTED Final   Streptococcus pyogenes NOT DETECTED NOT DETECTED Final   A.calcoaceticus-baumannii NOT DETECTED NOT DETECTED Final   Bacteroides fragilis NOT DETECTED NOT DETECTED Final   Enterobacterales NOT DETECTED NOT DETECTED Final   Enterobacter cloacae complex NOT DETECTED NOT DETECTED Final   Escherichia coli NOT DETECTED NOT DETECTED Final   Klebsiella aerogenes NOT DETECTED NOT DETECTED Final   Klebsiella oxytoca NOT DETECTED NOT DETECTED Final   Klebsiella pneumoniae NOT DETECTED NOT DETECTED Final   Proteus species NOT DETECTED NOT DETECTED Final   Salmonella species NOT DETECTED NOT DETECTED Final   Serratia marcescens NOT DETECTED NOT DETECTED Final   Haemophilus influenzae NOT DETECTED NOT DETECTED Final   Neisseria meningitidis NOT DETECTED NOT DETECTED Final   Pseudomonas aeruginosa NOT  DETECTED NOT DETECTED Final    Stenotrophomonas maltophilia NOT DETECTED NOT DETECTED Final   Candida albicans NOT DETECTED NOT DETECTED Final   Candida auris NOT DETECTED NOT DETECTED Final   Candida glabrata NOT DETECTED NOT DETECTED Final   Candida krusei NOT DETECTED NOT DETECTED Final   Candida parapsilosis NOT DETECTED NOT DETECTED Final   Candida tropicalis NOT DETECTED NOT DETECTED Final   Cryptococcus neoformans/gattii NOT DETECTED NOT DETECTED Final    Comment: Performed at [facility], [address], [place], [place] [address]  Culture, blood (Routine X 2) w Reflex to ID Panel     Status: Abnormal   Collection Time: 01/06/24  8:22 PM   Specimen: BLOOD  Result Value Ref Range Status   Specimen Description BLOOD SITE NOT SPECIFIED  Final   Special Requests   Final    BOTTLES DRAWN AEROBIC AND ANAEROBIC Blood Culture adequate volume   Culture  Setup Time   Final    GRAM NEGATIVE RODS AEROBIC BOTTLE ONLY CRITICAL VALUE NOTED.  VALUE IS CONSISTENT WITH PREVIOUSLY REPORTED AND CALLED VALUE.    Culture (A)  Final    BURKHOLDERIA SPECIES SUSCEPTIBILITIES PERFORMED ON PREVIOUS CULTURE WITHIN THE LAST 5 DAYS. Performed at [facility], [address] [place], [place] [address]    Report Status 01/11/2024 FINAL  Final  Culture, blood (Routine X 2) w Reflex to ID Panel     Status: Abnormal   Collection Time: 01/06/24  8:22 PM   Specimen: BLOOD  Result Value Ref Range Status   Specimen Description BLOOD SITE NOT SPECIFIED  Final   Special Requests   Final    BOTTLES DRAWN AEROBIC AND ANAEROBIC Blood Culture adequate volume   Culture  Setup Time   Final    GRAM NEGATIVE RODS AEROBIC BOTTLE ONLY CRITICAL VALUE NOTED.  VALUE IS CONSISTENT WITH PREVIOUSLY REPORTED AND CALLED VALUE.    Culture (A)  Final    BURKHOLDERIA SPECIES SUSCEPTIBILITIES PERFORMED ON PREVIOUS CULTURE WITHIN THE LAST 5 DAYS. Performed at [facility], [address] [place], [place] [address]    Report Status  01/11/2024 FINAL  Final  Culture, blood (Routine X 2) w Reflex to ID Panel     Status: None   Collection Time: 01/09/24  2:27 PM   Specimen: BLOOD  Result Value Ref Range Status   Specimen Description BLOOD SITE NOT SPECIFIED  Final   Special Requests   Final    BOTTLES DRAWN AEROBIC AND ANAEROBIC Blood Culture adequate volume   Culture   Final    NO GROWTH 5 DAYS Performed at [facility], [address], [place], [place] [address]    Report Status 01/14/2024 FINAL  Final  Culture, blood (Routine X 2) w Reflex to ID Panel     Status: None   Collection Time: 01/09/24  2:27 PM   Specimen: BLOOD  Result Value Ref Range Status   Specimen Description BLOOD SITE NOT SPECIFIED  Final   Special Requests   Final    BOTTLES DRAWN AEROBIC AND ANAEROBIC Blood Culture adequate volume   Culture   Final    NO GROWTH 5 DAYS Performed at [facility], [address], [place], [place] [address]    Report Status 01/14/2024 FINAL  Final    Labs: CBC: Recent Labs  Lab 01/12/24 0730 01/13/24 0547 01/14/24 0511 01/15/24 0603 01/16/24 0728  WBC 10.0 11.6* 11.0* 9.0 8.3  HGB 8.3*  8.4* 8.7* 8.7* 8.2*  HCT 26.0* 26.1* 27.1* 27.2* 25.2*  MCV 91.9 91.6 92.2 93.2 90.6  PLT 234 246 285 329 289   Basic Metabolic Panel: Recent Labs  Lab 01/12/24 0730 01/13/24 0547 01/14/24 0511 01/15/24 0603 01/16/24 0728 01/16/24 0730  NA 135 133* 131* 132*  --  132*  K 3.7 4.0 4.2 4.0  --  3.9  CL 100 99 97* 94*  --  98  CO2 25 23 26 25   --  26  GLUCOSE 86 79 76 82  --  69*  BUN 31* 40* 24* 34*  --  17  CREATININE 5.64* 6.95* 5.33* 6.81*  --  4.73*  CALCIUM  8.4* 8.2* 8.3* 9.0  --  8.4*  MG  --   --   --   --  1.8  --   PHOS 5.4* 6.6* 5.4* 7.1*  --  4.9*   Liver Function Tests: Recent Labs  Lab 01/10/24 1459 01/11/24 0824 01/11/24 0925 01/12/24 0730 01/13/24 0547 01/14/24 0511 01/15/24 0603 01/16/24 0730  AST 51*  --  36  --   --   --   --   --   ALT 14  --  14  --   --   --    --   --   ALKPHOS 119  --  93  --   --   --   --   --   BILITOT 0.9  --  0.8  --   --   --   --   --   PROT 7.1  --  6.4*  --   --   --   --   --   ALBUMIN  2.7*   < > 2.4* 2.5* 2.5* 2.4* 2.9* 2.5*   < > = values in this interval not displayed.   CBG: Recent Labs  Lab 01/10/24 0552 01/10/24 0717 01/10/24 1032  GLUCAP 75 81 136*    Discharge time spent: 26 minutes.  Length of stay: 13 days  Signed: Jodeane Mulligan, DO [facility] 01/16/2024

## 2024-01-16 NOTE — Discharge Planning (Signed)
 [facility] Patient Discharge Orders- [facility] CLINIC: [place]  Patient's name: Ginni Eichler Admit/DC Dates: 01/03/2024 - 01/16/2024  Discharge Diagnoses: Sepsis 2/2 fungemia    Possible cholecystitis  Aranesp : Given: yes   Date and amount of last dose: 100mcg on 01/15/24  Last Hgb: 8.2 PRBC's Given: no Date/# of units: n/a ESA dose for discharge: mircera 150 mcg IV q 2 weeks  IV Iron  dose at discharge: none  Heparin  change: no  EDW Change: no New EDW:   [place] Change: no  Access intervention/Change: yes Details: line holiday,new TDC placed 01/08/24 by IR  Hectorol/Calcitriol  change: no  Discharge Labs: Calcium  8.4 Phosphorus 4.9 Albumin  2.5 K+ 3.9  IV Antibiotics: no Details:  On Coumadin?: no Last INR: Next INR: Managed By:   OTHER/APPTS/LAB ORDERS:    D/C Meds to be reconciled by nurse after every discharge.  Completed By: Ramona Burner, PA-C 01/16/2024, 11:53 AM  [facility] Pager: [phone]   Reviewed by: MD:______ RN_______

## 2024-01-16 NOTE — Progress Notes (Signed)
 D/C order noted. Contacted [facility] to be advised of pt's d/c today and that pt should resume care tomorrow.   Olivia Canter Renal Navigator [phone]

## 2024-01-24 ENCOUNTER — Other Ambulatory Visit (HOSPITAL_COMMUNITY): Payer: Self-pay | Admitting: Internal Medicine

## 2024-02-27 ENCOUNTER — Encounter (HOSPITAL_COMMUNITY): Payer: Self-pay

## 2024-03-20 ENCOUNTER — Other Ambulatory Visit: Payer: Self-pay | Admitting: Vascular Surgery

## 2024-03-20 DIAGNOSIS — N184 Chronic kidney disease, stage 4 (severe): Secondary | ICD-10-CM

## 2024-04-21 ENCOUNTER — Ambulatory Visit: Attending: Internal Medicine

## 2024-04-21 ENCOUNTER — Ambulatory Visit (HOSPITAL_COMMUNITY): Admission: RE | Admit: 2024-04-21 | Source: Ambulatory Visit

## 2024-04-26 ENCOUNTER — Emergency Department (HOSPITAL_COMMUNITY)
Admission: EM | Admit: 2024-04-26 | Discharge: 2024-04-27 | Disposition: A | Discharge status: Home / Self Care | Attending: Emergency Medicine | Admitting: Emergency Medicine

## 2024-04-26 ENCOUNTER — Emergency Department (HOSPITAL_COMMUNITY)

## 2024-04-26 ENCOUNTER — Other Ambulatory Visit: Payer: Self-pay

## 2024-04-26 ENCOUNTER — Encounter (HOSPITAL_COMMUNITY): Payer: Self-pay | Admitting: *Deleted

## 2024-04-26 DIAGNOSIS — J449 Chronic obstructive pulmonary disease, unspecified: Secondary | ICD-10-CM | POA: Insufficient documentation

## 2024-04-26 DIAGNOSIS — I509 Heart failure, unspecified: Secondary | ICD-10-CM | POA: Diagnosis not present

## 2024-04-26 DIAGNOSIS — F1410 Cocaine abuse, uncomplicated: Secondary | ICD-10-CM | POA: Insufficient documentation

## 2024-04-26 DIAGNOSIS — G20C Parkinsonism, unspecified: Secondary | ICD-10-CM | POA: Insufficient documentation

## 2024-04-26 DIAGNOSIS — D631 Anemia in chronic kidney disease: Secondary | ICD-10-CM | POA: Insufficient documentation

## 2024-04-26 DIAGNOSIS — Z7982 Long term (current) use of aspirin: Secondary | ICD-10-CM | POA: Insufficient documentation

## 2024-04-26 DIAGNOSIS — Z992 Dependence on renal dialysis: Secondary | ICD-10-CM | POA: Diagnosis not present

## 2024-04-26 DIAGNOSIS — F17210 Nicotine dependence, cigarettes, uncomplicated: Secondary | ICD-10-CM | POA: Insufficient documentation

## 2024-04-26 DIAGNOSIS — N186 End stage renal disease: Secondary | ICD-10-CM | POA: Insufficient documentation

## 2024-04-26 DIAGNOSIS — I132 Hypertensive heart and chronic kidney disease with heart failure and with stage 5 chronic kidney disease, or end stage renal disease: Secondary | ICD-10-CM | POA: Diagnosis present

## 2024-04-26 DIAGNOSIS — Z7951 Long term (current) use of inhaled steroids: Secondary | ICD-10-CM | POA: Diagnosis not present

## 2024-04-26 DIAGNOSIS — E1122 Type 2 diabetes mellitus with diabetic chronic kidney disease: Secondary | ICD-10-CM | POA: Diagnosis not present

## 2024-04-26 DIAGNOSIS — E875 Hyperkalemia: Secondary | ICD-10-CM | POA: Diagnosis not present

## 2024-04-26 DIAGNOSIS — Z79899 Other long term (current) drug therapy: Secondary | ICD-10-CM | POA: Insufficient documentation

## 2024-04-26 LAB — I-STAT CHEM 8, ED
BUN: 53 mg/dL — ABNORMAL HIGH (ref 8–23)
Calcium, Ion: 1.03 mmol/L — ABNORMAL LOW (ref 1.15–1.40)
Chloride: 96 mmol/L — ABNORMAL LOW (ref 98–111)
Creatinine, Ser: 11.3 mg/dL — ABNORMAL HIGH (ref 0.44–1.00)
Glucose, Bld: 76 mg/dL (ref 70–99)
HCT: 31 % — ABNORMAL LOW (ref 36.0–46.0)
Hemoglobin: 10.5 g/dL — ABNORMAL LOW (ref 12.0–15.0)
Potassium: 5.4 mmol/L — ABNORMAL HIGH (ref 3.5–5.1)
Sodium: 127 mmol/L — ABNORMAL LOW (ref 135–145)
TCO2: 18 mmol/L — ABNORMAL LOW (ref 22–32)

## 2024-04-26 LAB — CBC
HCT: 28.3 % — ABNORMAL LOW (ref 36.0–46.0)
Hemoglobin: 9.4 g/dL — ABNORMAL LOW (ref 12.0–15.0)
MCH: 29.2 pg (ref 26.0–34.0)
MCHC: 33.2 g/dL (ref 30.0–36.0)
MCV: 87.9 fL (ref 80.0–100.0)
Platelets: 191 K/uL (ref 150–400)
RBC: 3.22 MIL/uL — ABNORMAL LOW (ref 3.87–5.11)
RDW: 16.6 % — ABNORMAL HIGH (ref 11.5–15.5)
WBC: 5.6 K/uL (ref 4.0–10.5)
nRBC: 0 % (ref 0.0–0.2)

## 2024-04-26 LAB — URINALYSIS, ROUTINE W REFLEX MICROSCOPIC
Bilirubin Urine: NEGATIVE
Glucose, UA: NEGATIVE mg/dL
Hgb urine dipstick: NEGATIVE
Ketones, ur: NEGATIVE mg/dL
Leukocytes,Ua: NEGATIVE
Nitrite: NEGATIVE
Protein, ur: >=300 mg/dL — AB
Specific Gravity, Urine: 1.007 (ref 1.005–1.030)
pH: 8 (ref 5.0–8.0)

## 2024-04-26 LAB — BASIC METABOLIC PANEL WITH GFR
Anion gap: 18 — ABNORMAL HIGH (ref 5–15)
BUN: 58 mg/dL — ABNORMAL HIGH (ref 8–23)
CO2: 17 mmol/L — ABNORMAL LOW (ref 22–32)
Calcium: 9.1 mg/dL (ref 8.9–10.3)
Chloride: 92 mmol/L — ABNORMAL LOW (ref 98–111)
Creatinine, Ser: 10.76 mg/dL — ABNORMAL HIGH (ref 0.44–1.00)
GFR, Estimated: 4 mL/min — ABNORMAL LOW (ref >60)
Glucose, Bld: 73 mg/dL (ref 70–99)
Potassium: 5.4 mmol/L — ABNORMAL HIGH (ref 3.5–5.1)
Sodium: 127 mmol/L — ABNORMAL LOW (ref 135–145)

## 2024-04-26 LAB — TROPONIN I (HIGH SENSITIVITY): Troponin I (High Sensitivity): 17 ng/L (ref <18)

## 2024-04-26 MED ORDER — SODIUM ZIRCONIUM CYCLOSILICATE 10 G PO PACK
10.0000 g | PACK | Freq: Once | ORAL | Status: AC
  Administered 2024-04-26: 10 g via ORAL
  Filled 2024-04-26: qty 1

## 2024-04-26 MED ORDER — CHLORHEXIDINE GLUCONATE CLOTH 2 % EX PADS: 6.0000 | MEDICATED_PAD | Freq: Every day | CUTANEOUS | Status: DC

## 2024-04-26 NOTE — ED Notes (Signed)
 ED Provider at bedside.

## 2024-04-26 NOTE — ED Provider Notes (Signed)
 [place] EMERGENCY DEPARTMENT AT [facility] Provider Note   CSN: [id number] Arrival date & time: 04/26/24  2043     Patient presents with: Weakness   Carol [name] is a 63 y.o. female.  {Add pertinent medical, surgical, social history, OB history to YEP:67052} The history is provided by the patient and medical records.  Weakness  63 y.o. M with hx of obesity, COPD, CHF, ESRD on hemodialysis, cocaine  abuse, diabetes, presenting to the ED with generalized weakness and overall feeling poorly.  She has missed her last 2 dialysis sessions, 1 due to an appointment and the other as she was not feeling well.  She has not had dialysis since last Monday, 04/20/2024.  States she just feels generally weak where she is having a hard time getting up and also has felt a little foggy headed.  She denies any frank confusion.  She is not had any falls or trauma.  Normally attends dialysis at the [facility] center.  Does not feel like she can make it to her HD center tomorrow evening for her normal scheduled session.  Admits to some cocaine  use on Friday.  Denies chest pain but does feel SOB.  Prior to Admission medications   Medication Sig Start Date End Date Taking? Authorizing Provider  albuterol  (PROVENTIL  HFA;VENTOLIN  HFA) 108 (90 Base) MCG/ACT inhaler Inhale 2 puffs into the lungs every 4 (four) hours as needed for wheezing or shortness of breath.    [provider]  albuterol  (VENTOLIN  HFA) 108 (90 Base) MCG/ACT inhaler Inhale 1-2 puffs into the lungs every 6 (six) hours as needed for wheezing or shortness of breath. 08/25/23   Elnor Jayson LABOR, DO  amLODipine  (NORVASC ) 10 MG tablet Take 1 tablet (10 mg total) by mouth daily. 11/01/22   Glena Harlene HERO, FNP  aspirin  EC 81 MG tablet Take 1 tablet (81 mg total) by mouth daily. Swallow whole. 05/11/22   Lemon Harlene, MD  atorvastatin  (LIPITOR ) 80 MG tablet Take 1 tablet (80 mg total) by mouth daily. 03/11/23   Lue Elsie BROCKS, MD  budesonide -formoterol  [facility]) 160-4.5 MCG/ACT inhaler Inhale 2 puffs into the lungs in the morning and at bedtime. 11/09/21   [provider]  cetirizine (ZYRTEC) 10 MG tablet Take 10 mg by mouth daily as needed for allergies.    [provider]  Darbepoetin Alfa  (ARANESP ) 60 MCG/0.3ML SOSY injection Inject 0.3 mLs (60 mcg total) into the skin every Thursday at 6pm. 08/08/23   Dezii, Alexandra, DO  eszopiclone 3 MG TABS Take 3 mg by mouth at bedtime. Take immediately before bedtime    [provider]  fluconazole  (DIFLUCAN ) 200 MG tablet Take 1 tablet (200 mg total) by mouth at bedtime. 01/16/24   Arlon Carliss ORN, DO  gabapentin  (NEURONTIN ) 100 MG capsule Take 1 capsule (100 mg total) by mouth 2 (two) times daily. 08/14/23 01/03/24  Dezii, Alexandra, DO  glucagon  1 MG injection Follow package directions for low blood sugar. 08/14/23 08/13/24  Dezii, Alexandra, DO  guaiFENesin -dextromethorphan (ROBITUSSIN DM) 100-10 MG/5ML syrup Take 5 mLs by mouth every 4 (four) hours as needed for cough. 08/25/23   Elnor Jayson LABOR, DO  lidocaine  (LIDODERM ) 5 % Place 1 patch onto the skin daily. Remove & Discard patch within 12 hours or as directed by MD 12/10/23   Garrick Charleston, MD  naloxone  (NARCAN ) nasal spray 4 mg/0.1 mL If concern for opioid overdose, spray in nostril. Call 911. 08/14/23   Dezii, Alexandra, DO  naphazoline-pheniramine (NAPHCON-A) 0.025-0.3 % ophthalmic solution Place 2 drops into both eyes 2 (two) times daily.    [provider]  nitroGLYCERIN  (NITROSTAT ) 0.4 MG SL tablet Place 1 tablet (0.4 mg total) under the tongue every 5 (five) minutes as needed for chest pain. 05/11/22 01/03/24  Lemon Raisin, MD  oxyCODONE -acetaminophen  (PERCOCET/ROXICET) 5-325 MG tablet Take 1 tablet by mouth every 8 (eight) hours as needed for severe pain (pain score 7-10). 12/10/23   Garrick Charleston, MD  pantoprazole  (PROTONIX ) 40 MG tablet Take 1 tablet (40 mg total) by  mouth daily at 12 noon. Patient taking differently: Take 40 mg by mouth at bedtime. 05/11/22   Lemon Raisin, MD  promethazine -dextromethorphan (PROMETHAZINE -DM) 6.25-15 MG/5ML syrup Take 5 mLs by mouth 4 (four) times daily as needed for cough. 11/07/23   Patt Alm Macho, MD  triamcinolone  ointment (KENALOG ) 0.1 % 1 Application 2 (two) times daily as needed. 06/29/23   [provider]    Allergies: Penicillins and Ultram  [tramadol ]    Review of Systems  Neurological:  Positive for weakness.  All other systems reviewed and are negative.   Updated Vital Signs BP (!) 142/75 (BP Location: Left Arm)   Pulse 94   Temp 97.7 F (36.5 C) (Oral)   Resp 18   Ht 5' 4 (1.626 m)   Wt 63.5 kg   SpO2 100%   BMI 24.03 kg/m   Physical Exam Vitals and nursing note reviewed.  Constitutional:      Appearance: She is well-developed.  HENT:     Head: Normocephalic and atraumatic.  Eyes:     Conjunctiva/sclera: Conjunctivae normal.     Pupils: Pupils are equal, round, and reactive to light.  Cardiovascular:     Rate and Rhythm: Normal rate and regular rhythm.     Heart sounds: Normal heart sounds.  Pulmonary:     Effort: Pulmonary effort is normal.     Breath sounds: Normal breath sounds.     Comments: 2L O2 in use for comfort, seems a bit winded with conversation Abdominal:     General: Bowel sounds are normal.     Palpations: Abdomen is soft.  Musculoskeletal:        General: Normal range of motion.     Cervical back: Normal range of motion.  Skin:    General: Skin is warm and dry.  Neurological:     Mental Status: She is alert and oriented to person, place, and time.     (all labs ordered are listed, but only abnormal results are displayed) Labs Reviewed  BASIC METABOLIC PANEL WITH GFR - Abnormal; Notable for the following components:      Result Value   Sodium 127 (*)    Potassium 5.4 (*)    Chloride 92 (*)    CO2 17 (*)    BUN 58 (*)    Creatinine, Ser 10.76 (*)     GFR, Estimated 4 (*)    Anion gap 18 (*)    All other components within normal limits  CBC - Abnormal; Notable for the following components:   RBC 3.22 (*)    Hemoglobin 9.4 (*)    HCT 28.3 (*)    RDW 16.6 (*)    All other components within normal limits  URINALYSIS, ROUTINE W REFLEX MICROSCOPIC - Abnormal; Notable for the following components:   APPearance HAZY (*)    Protein, ur >=300 (*)    Bacteria, UA FEW (*)    All other components within  normal limits  I-STAT CHEM 8, ED - Abnormal; Notable for the following components:   Sodium 127 (*)    Potassium 5.4 (*)    Chloride 96 (*)    BUN 53 (*)    Creatinine, Ser 11.30 (*)    Calcium , Ion 1.03 (*)    TCO2 18 (*)    Hemoglobin 10.5 (*)    HCT 31.0 (*)    All other components within normal limits  TROPONIN I (HIGH SENSITIVITY)  TROPONIN I (HIGH SENSITIVITY)    EKG: None  Radiology: DG Chest 2 View Result Date: 04/26/2024 CLINICAL DATA:  Shortness of breath EXAM: CHEST - 2 VIEW COMPARISON:  01/03/2024 FINDINGS: Cardiomegaly, vascular congestion. No confluent airspace opacities, effusions or edema. No acute bony abnormality. Right dialysis catheter unchanged. IMPRESSION: Cardiomegaly, vascular congestion. Electronically Signed   By: Franky Crease M.D.   On: 04/26/2024 22:02    {Document cardiac monitor, telemetry assessment procedure when appropriate:32947} Procedures   Medications Ordered in the ED - No data to display    {Click here for ABCD2, HEART and other calculators REFRESH Note before signing:1}                              Medical Decision Making Amount and/or Complexity of Data Reviewed Labs: ordered. Radiology: ordered and independent interpretation performed. ECG/medicine tests: ordered and independent interpretation performed.  Risk Prescription drug management.   ***  10:58 PM Spoke with on call nephrology, Dr. Geralynn-- can dialyze in AM, lokelma  for now.  He will place orders.  Final  diagnoses:  None    ED Discharge Orders     None

## 2024-04-26 NOTE — ED Provider Notes (Incomplete)
 [place] EMERGENCY DEPARTMENT AT [facility] Provider Note   CSN: [id number] Arrival date & time: 04/26/24  2043     Patient presents with: Weakness   Carol [name] is a 63 y.o. female.  {Add pertinent medical, surgical, social history, OB history to YEP:67052} The history is provided by the patient and medical records.  Weakness  63 y.o. M with hx of obesity, COPD, CHF, ESRD on hemodialysis, cocaine  abuse, diabetes, presenting to the ED with generalized weakness and overall feeling poorly.  She has missed her last 2 dialysis sessions, 1 due to an appointment and the other as she was not feeling well.  She has not had dialysis since last Monday, 04/20/2024.  States she just feels generally weak where she is having a hard time getting up and also has felt a little foggy headed.  She denies any frank confusion.  She is not had any falls or trauma.  Normally attends dialysis at the [facility] center.  Does not feel like she can make it to her HD center tomorrow evening for her normal scheduled session.  Admits to some cocaine  use on Friday.  Denies chest pain but does feel SOB.  Prior to Admission medications   Medication Sig Start Date End Date Taking? Authorizing Provider  albuterol  (PROVENTIL  HFA;VENTOLIN  HFA) 108 (90 Base) MCG/ACT inhaler Inhale 2 puffs into the lungs every 4 (four) hours as needed for wheezing or shortness of breath.    [provider]  albuterol  (VENTOLIN  HFA) 108 (90 Base) MCG/ACT inhaler Inhale 1-2 puffs into the lungs every 6 (six) hours as needed for wheezing or shortness of breath. 08/25/23   Elnor Jayson LABOR, DO  amLODipine  (NORVASC ) 10 MG tablet Take 1 tablet (10 mg total) by mouth daily. 11/01/22   Glena Harlene HERO, FNP  aspirin  EC 81 MG tablet Take 1 tablet (81 mg total) by mouth daily. Swallow whole. 05/11/22   Lemon Harlene, MD  atorvastatin  (LIPITOR ) 80 MG tablet Take 1 tablet (80 mg total) by mouth daily. 03/11/23   Lue Elsie BROCKS, MD  budesonide -formoterol  [facility]) 160-4.5 MCG/ACT inhaler Inhale 2 puffs into the lungs in the morning and at bedtime. 11/09/21   [provider]  cetirizine (ZYRTEC) 10 MG tablet Take 10 mg by mouth daily as needed for allergies.    [provider]  Darbepoetin Alfa  (ARANESP ) 60 MCG/0.3ML SOSY injection Inject 0.3 mLs (60 mcg total) into the skin every Thursday at 6pm. 08/08/23   Dezii, Alexandra, DO  eszopiclone 3 MG TABS Take 3 mg by mouth at bedtime. Take immediately before bedtime    [provider]  fluconazole  (DIFLUCAN ) 200 MG tablet Take 1 tablet (200 mg total) by mouth at bedtime. 01/16/24   Arlon Carliss ORN, DO  gabapentin  (NEURONTIN ) 100 MG capsule Take 1 capsule (100 mg total) by mouth 2 (two) times daily. 08/14/23 01/03/24  Dezii, Alexandra, DO  glucagon  1 MG injection Follow package directions for low blood sugar. 08/14/23 08/13/24  Dezii, Alexandra, DO  guaiFENesin -dextromethorphan (ROBITUSSIN DM) 100-10 MG/5ML syrup Take 5 mLs by mouth every 4 (four) hours as needed for cough. 08/25/23   Elnor Jayson LABOR, DO  lidocaine  (LIDODERM ) 5 % Place 1 patch onto the skin daily. Remove & Discard patch within 12 hours or as directed by MD 12/10/23   Garrick Charleston, MD  naloxone  (NARCAN ) nasal spray 4 mg/0.1 mL If concern for opioid overdose, spray in nostril. Call 911. 08/14/23   Dezii, Alexandra, DO  naphazoline-pheniramine (NAPHCON-A) 0.025-0.3 % ophthalmic solution Place 2 drops into both eyes 2 (two) times daily.    [provider]  nitroGLYCERIN  (NITROSTAT ) 0.4 MG SL tablet Place 1 tablet (0.4 mg total) under the tongue every 5 (five) minutes as needed for chest pain. 05/11/22 01/03/24  Lemon Raisin, MD  oxyCODONE -acetaminophen  (PERCOCET/ROXICET) 5-325 MG tablet Take 1 tablet by mouth every 8 (eight) hours as needed for severe pain (pain score 7-10). 12/10/23   Garrick Charleston, MD  pantoprazole  (PROTONIX ) 40 MG tablet Take 1 tablet (40 mg total) by  mouth daily at 12 noon. Patient taking differently: Take 40 mg by mouth at bedtime. 05/11/22   Lemon Raisin, MD  promethazine -dextromethorphan (PROMETHAZINE -DM) 6.25-15 MG/5ML syrup Take 5 mLs by mouth 4 (four) times daily as needed for cough. 11/07/23   Patt Alm Macho, MD  triamcinolone  ointment (KENALOG ) 0.1 % 1 Application 2 (two) times daily as needed. 06/29/23   [provider]    Allergies: Penicillins and Ultram  [tramadol ]    Review of Systems  Neurological:  Positive for weakness.  All other systems reviewed and are negative.   Updated Vital Signs BP (!) 142/75 (BP Location: Left Arm)   Pulse 94   Temp 97.7 F (36.5 C) (Oral)   Resp 18   Ht 5' 4 (1.626 m)   Wt 63.5 kg   SpO2 100%   BMI 24.03 kg/m   Physical Exam Vitals and nursing note reviewed.  Constitutional:      Appearance: She is well-developed.  HENT:     Head: Normocephalic and atraumatic.  Eyes:     Conjunctiva/sclera: Conjunctivae normal.     Pupils: Pupils are equal, round, and reactive to light.  Cardiovascular:     Rate and Rhythm: Normal rate and regular rhythm.     Heart sounds: Normal heart sounds.  Pulmonary:     Effort: Pulmonary effort is normal.     Breath sounds: Normal breath sounds.     Comments: 2L O2 in use for comfort, seems a bit winded with conversation Abdominal:     General: Bowel sounds are normal.     Palpations: Abdomen is soft.  Musculoskeletal:        General: Normal range of motion.     Cervical back: Normal range of motion.  Skin:    General: Skin is warm and dry.  Neurological:     Mental Status: She is alert and oriented to person, place, and time.     (all labs ordered are listed, but only abnormal results are displayed) Labs Reviewed  BASIC METABOLIC PANEL WITH GFR - Abnormal; Notable for the following components:      Result Value   Sodium 127 (*)    Potassium 5.4 (*)    Chloride 92 (*)    CO2 17 (*)    BUN 58 (*)    Creatinine, Ser 10.76 (*)     GFR, Estimated 4 (*)    Anion gap 18 (*)    All other components within normal limits  CBC - Abnormal; Notable for the following components:   RBC 3.22 (*)    Hemoglobin 9.4 (*)    HCT 28.3 (*)    RDW 16.6 (*)    All other components within normal limits  URINALYSIS, ROUTINE W REFLEX MICROSCOPIC - Abnormal; Notable for the following components:   APPearance HAZY (*)    Protein, ur >=300 (*)    Bacteria, UA FEW (*)    All other components within  normal limits  I-STAT CHEM 8, ED - Abnormal; Notable for the following components:   Sodium 127 (*)    Potassium 5.4 (*)    Chloride 96 (*)    BUN 53 (*)    Creatinine, Ser 11.30 (*)    Calcium , Ion 1.03 (*)    TCO2 18 (*)    Hemoglobin 10.5 (*)    HCT 31.0 (*)    All other components within normal limits  TROPONIN I (HIGH SENSITIVITY)  TROPONIN I (HIGH SENSITIVITY)    EKG: None  Radiology: DG Chest 2 View Result Date: 04/26/2024 CLINICAL DATA:  Shortness of breath EXAM: CHEST - 2 VIEW COMPARISON:  01/03/2024 FINDINGS: Cardiomegaly, vascular congestion. No confluent airspace opacities, effusions or edema. No acute bony abnormality. Right dialysis catheter unchanged. IMPRESSION: Cardiomegaly, vascular congestion. Electronically Signed   By: Franky Crease M.D.   On: 04/26/2024 22:02    {Document cardiac monitor, telemetry assessment procedure when appropriate:32947} Procedures   Medications Ordered in the ED - No data to display    {Click here for ABCD2, HEART and other calculators REFRESH Note before signing:1}                              Medical Decision Making Amount and/or Complexity of Data Reviewed Labs: ordered. Radiology: ordered and independent interpretation performed. ECG/medicine tests: ordered and independent interpretation performed.  Risk Prescription drug management.   63 year old female presenting to the ED with generalized weakness and overall feeling poorly.  Has missed her last 2 dialysis sessions so  has not had treatment in about 1 week.  Does admit to feeling some shortness of breath but denies active chest pain.  Ongoing cocaine  use, last used on Friday.  She is afebrile and nontoxic in appearance here.  She is on 2 L for comfort currently, does get a bit winded with conversation during exam.  Labs as above--potassium is 5.4.  BUN 53.  No leukocytosis.  Troponin is normal.  Chest x-ray with some vascular congestion.  Patient normally dialyzes on Monday evenings at the [facility] center.  She states she does not feel well enough to have this done as an outpatient.  Will discuss with on-call nephrology today to try and arrange dialysis here.  10:58 PM Spoke with on call nephrology, Dr. Geralynn-- can dialyze in AM, lokelma  for now.  He will place orders.  Final diagnoses:  None    ED Discharge Orders     None

## 2024-04-26 NOTE — ED Triage Notes (Addendum)
 PT here via [facility] from home.  Pt last dialysis was last Monday (MWF).  Missed last Wed d/t appt at pain clinic and did not feel well enough to go on Fri.  Now c/o sob, weakness and spasms.  Pt states she last used cocaine  on Friday.  Bp 188/88 Hr 60 Sats 98% (placed on 2L [place] by ems for sob) Rr 20 Cbg 188  Pt ao x 4

## 2024-04-27 DIAGNOSIS — I132 Hypertensive heart and chronic kidney disease with heart failure and with stage 5 chronic kidney disease, or end stage renal disease: Secondary | ICD-10-CM | POA: Diagnosis not present

## 2024-04-27 LAB — HEPATITIS B SURFACE ANTIGEN: Hepatitis B Surface Ag: NONREACTIVE

## 2024-04-27 MED ORDER — HEPARIN SODIUM (PORCINE) 1000 UNIT/ML IJ SOLN
INTRAMUSCULAR | Status: AC
  Filled 2024-04-27: qty 4

## 2024-04-27 NOTE — Progress Notes (Addendum)
 Pt receives out-pt HD at [facility], MWF, [place] chair time. Will continue to assist as needed.   Carol [name] Dialysis Navigator [phone]  Addendum/late note entry 12:21 pm D/c noted. [facility]ed [facility] to expect arrival of pt Wednesday. No further support needed.

## 2024-04-27 NOTE — Discharge Instructions (Signed)
 You were seen in the ER today for evaluation. Please make sure that you are attending your dialysis appointments! If you have any concerns, new or worsening symptoms, please return to the nearest ER for re-evaluation.   Contact a health care provider if: You have a fever or chills. You get dizzy or weak and you haven't had this before. You have blurred vision. You don't feel a thrill in the fistula or graft. A thrill is a vibration over the access site. You have any of these signs of infection in your access site: Redness, swelling, or pain. Fluid or blood. Warmth. Pus or a bad smell. Get help right away if: You have a fistula or graft and you have these symptoms: The hand on the side of your body with a fistula gets painful, loses feeling, or turns blue or very pale. You have bleeding at your access site that's hard to control. You get short of breath or get chest pain. You get confused. This means you don't know the time of day or where you are. You have a seizure. These symptoms may be an emergency. Call 911 right away. Do not wait to see if the symptoms will go away. Do not drive yourself to the hospital.

## 2024-04-27 NOTE — Progress Notes (Signed)
  Received patient in bed to unit.   Informed consent signed and in chart.    TX duration: 3.5     Hand-off given to patient's nurse.   ED Access used: CVC Access issues: None dressing changed   Total UF removed: 3500 Medication(s) given:  none Post HD VS: 186/76 Post HD weight: Unable to obtain     Hunter Hacking LPN [facility]

## 2024-04-27 NOTE — Progress Notes (Signed)
 Patient ran on 1 K bath 45 min of beginning treatment then changed to a 2 KL bath per MD orders

## 2024-04-27 NOTE — Consult Note (Signed)
 [facility]  INPATIENT CONSULTATION  Reason for Consultation: ESRD co management Requesting Provider: Carlo Cornish PA-C, [facility] ER  HPI: Carol [name] is an 63 y.o. female with ESRD on HD with frequent misses, DM, GERD, Parkinson's disease, cocaine  use currently presenting with weakness and seen by nephrology for ESRD co management.  Pt fairly sleey at dialysis and not providing a robust history - history gathered from chart.  Came into ED overnight c/o weakness and feeling poorly.  Last HD was 9/1.  Last cocaine  use 9/5.    This morning she can tell me her name, [facility], 2025 but otherwise not really able to tell me any history.  She's been afebrile, normo to mildly HTN here with normal O2 sats on RA.  Labs with Na 127, K 5.4, BUN 53, Cr 11, WBC 5.6, Hb 10.5, UA no UTI, CXR congestion.   Plan is for return to ER after HD for reevaluation.   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   Chronic back pain    Depression    Diabetes mellitus    ESRD (end stage renal disease) on dialysis [facility])    M,W,F   GERD (gastroesophageal reflux disease)    Heart murmur    Hypertension    NSTEMI (non-ST elevated myocardial infarction) (HCC) 12/20/2021   Palpitations    Parkinson's disease (HCC)    PSH: Past Surgical History:  Procedure Laterality Date   AV FISTULA PLACEMENT Left 08/01/2023   Procedure: LEFT ARM BRACHIOBASILIC FISTULA CREATION;  Surgeon: Pearline Norman RAMAN, MD;  Location: [facility] OR;  Service: Vascular;  Laterality: Left;   BALLOON DILATION N/A 11/06/2022   Procedure: BALLOON DILATION;  Surgeon: Shila Gustav GAILS, MD;  Location: [facility] ENDOSCOPY;  Service: Gastroenterology;  Laterality: N/A;   CARPAL TUNNEL RELEASE     CHOLECYSTECTOMY N/A 01/10/2024   Procedure: LAPAROSCOPIC CHOLECYSTECTOMY, ATTEMPTED;  Surgeon: Polly Cordella LABOR, MD;  Location: [facility] OR;  Service: General;   Laterality: N/A;   CHOLECYSTECTOMY N/A 01/10/2024   Procedure: OPEN CHOLECYSTECTOMY;  Surgeon: Polly Cordella LABOR, MD;  Location: [facility] OR;  Service: General;  Laterality: N/A;   COLONOSCOPY WITH PROPOFOL  N/A 11/06/2022   Procedure: COLONOSCOPY WITH PROPOFOL ;  Surgeon: Shila Gustav GAILS, MD;  Location: [facility] ENDOSCOPY;  Service: Gastroenterology;  Laterality: N/A;   ESOPHAGOGASTRODUODENOSCOPY (EGD) WITH PROPOFOL  N/A 11/06/2022   Procedure: ESOPHAGOGASTRODUODENOSCOPY (EGD) WITH PROPOFOL ;  Surgeon: Shila Gustav GAILS, MD;  Location: [facility] ENDOSCOPY;  Service: Gastroenterology;  Laterality: N/A;   INDOCYANINE GREEN  FLUORESCENCE IMAGING (ICG) N/A 01/10/2024   Procedure: INDOCYANINE GREEN  FLUORESCENCE IMAGING (ICG);  Surgeon: Polly Cordella LABOR, MD;  Location: [facility] OR;  Service: General;  Laterality: N/A;   INSERTION OF DIALYSIS CATHETER Right 08/01/2023   Procedure: INSERTION OF TUNNELED DIALYSIS CATHETER;  Surgeon: Pearline Norman RAMAN, MD;  Location: [facility] OR;  Service: Vascular;  Laterality: Right;   IR FLUORO GUIDE CV LINE RIGHT  01/26/2022   IR FLUORO GUIDE CV LINE RIGHT  01/08/2024   IR REMOVAL TUN CV CATH W/O FL  01/06/2024   IR US  GUIDE VASC ACCESS RIGHT  01/26/2022   IR US  GUIDE VASC ACCESS RIGHT  01/08/2024   LEFT HEART CATH AND CORONARY ANGIOGRAPHY N/A 12/20/2021   Procedure: LEFT HEART CATH AND CORONARY ANGIOGRAPHY;  Surgeon: Verlin Lonni BIRCH, MD;  Location: [facility] INVASIVE CV LAB;  Service: Cardiovascular;  Laterality: N/A;   OPEN REDUCTION  INTERNAL FIXATION (ORIF) FINGER WITH RADIAL BONE GRAFT Right 09/12/2015   Procedure: RIGHT MIDDLE FINGER CLOSED REDUCTION AND PINNING;  Surgeon: Prentice Pagan, MD;  Location: [facility] OR;  Service: Orthopedics;  Laterality: Right;   TONSILLECTOMY     TRANSESOPHAGEAL ECHOCARDIOGRAM (CATH LAB) N/A 01/09/2024   Procedure: TRANSESOPHAGEAL ECHOCARDIOGRAM;  Surgeon: Mona Vinie BROCKS, MD;  Location: [facility] INVASIVE CV LAB;  Service: Cardiovascular;  Laterality: N/A;   TUBAL LIGATION       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  Medications Prior to Admission  Medication Sig Dispense Refill   albuterol  (PROVENTIL  HFA;VENTOLIN  HFA) 108 (90 Base) MCG/ACT inhaler Inhale 2 puffs into the lungs every 4 (four) hours as needed for wheezing or shortness of breath.     albuterol  (VENTOLIN  HFA) 108 (90 Base) MCG/ACT inhaler Inhale 1-2 puffs into the lungs every 6 (six) hours as needed for wheezing or shortness of breath. 1 each 0   amLODipine  (NORVASC ) 10 MG tablet Take 1 tablet (10 mg total) by mouth daily. 30 tablet 3   aspirin  EC 81 MG tablet Take 1 tablet (81 mg total) by mouth daily. Swallow whole. 30 tablet 0   atorvastatin  (LIPITOR ) 80 MG tablet Take 1 tablet (80 mg total) by mouth daily. 30 tablet 0   budesonide -formoterol  (SYMBICORT) 160-4.5 MCG/ACT inhaler Inhale 2 puffs into the lungs in the morning and at bedtime.     cetirizine (ZYRTEC) 10 MG tablet Take 10 mg by mouth daily as needed for allergies.     Darbepoetin Alfa  (ARANESP ) 60 MCG/0.3ML SOSY injection Inject 0.3 mLs (60 mcg total) into the skin every Thursday at 6pm.     eszopiclone 3 MG TABS Take 3 mg by mouth at bedtime. Take immediately before bedtime     fluconazole  (DIFLUCAN ) 200 MG tablet Take 1 tablet (200 mg total) by mouth at bedtime. 3 tablet 0   gabapentin  (NEURONTIN ) 100 MG capsule Take 1 capsule (100 mg total) by mouth 2 (two) times daily. 60 capsule 0   glucagon  1 MG injection Follow package directions for low blood sugar. 1 each  1   guaiFENesin -dextromethorphan (ROBITUSSIN DM) 100-10 MG/5ML syrup Take 5 mLs by mouth every 4 (four) hours as needed for cough. 118 mL 0   lidocaine  (LIDODERM ) 5 % Place 1 patch onto the skin daily. Remove & Discard patch within 12 hours or as directed by MD 30 patch 0   naloxone  (NARCAN ) nasal spray 4 mg/0.1 mL If concern for opioid overdose, spray in nostril. Call 911. 1 each 0   naphazoline-pheniramine (NAPHCON-A) 0.025-0.3 % ophthalmic solution Place 2 drops into both eyes 2 (two) times daily.     nitroGLYCERIN  (NITROSTAT ) 0.4 MG SL tablet Place 1 tablet (0.4 mg total) under the tongue every 5 (five) minutes as needed for chest pain. 30 tablet 0   oxyCODONE -acetaminophen  (PERCOCET/ROXICET) 5-325 MG tablet Take 1 tablet by mouth every 8 (eight) hours as needed for severe pain (pain  score 7-10). 10 tablet 0   pantoprazole  (PROTONIX ) 40 MG tablet Take 1 tablet (40 mg total) by mouth daily at 12 noon. (Patient taking differently: Take 40 mg by mouth at bedtime.) 30 tablet 0   promethazine -dextromethorphan (PROMETHAZINE -DM) 6.25-15 MG/5ML syrup Take 5 mLs by mouth 4 (four) times daily as needed for cough. 50 mL 0   triamcinolone  ointment (KENALOG ) 0.1 % 1 Application 2 (two) times daily as needed.      ALLERGIES:   Allergies  Allergen Reactions   Penicillins Shortness Of Breath and Other (See Comments)    Caused yeast infection Has patient had a PCN reaction causing immediate rash, facial/tongue/throat swelling, SOB or lightheadedness with hypotension: Yes Has patient had a PCN reaction causing severe rash involving mucus membranes or skin necrosis: No Has patient had a PCN reaction that required hospitalization pt was in the hospital at the time of the reaction Has patient had a PCN reaction occurring within the last 10 years: No If all of the above answers are NO, then may proceed with Cephalosp   Ultram  [Tramadol ] Other (See Comments)    Made her tongue raw    FAM HX: Family  History  Problem Relation Age of Onset   Diabetes Mother    Kidney disease Mother    Seizures Father    Hypertension Father    Stroke Father    Asthma Other     Social History:   reports that she has been smoking cigarettes. She has a 0.8 pack-year smoking history. She has been exposed to tobacco smoke. She has never used smokeless tobacco. She reports current alcohol use. She reports current drug use. Drug: Cocaine .  ROS: 12 system ROS neg except per HPI  Blood pressure (!) 163/76, pulse (!) 58, temperature 98.3 F (36.8 C), resp. rate 13, height 5' 4 (1.626 m), weight 63.5 kg, SpO2 100%. PHYSICAL EXAM: Gen: calm in bed, sleepy, arousable to voice  Eyes: EOMI, anicteric ENT: MMM Neck: supple CV:  sinus brady, no rub Abd:  soft, nontender Lungs: clear ant, normal WOB on RA Extr: no edema Neuro: oriented x3 and [address] dialysis but fairly lethargic, shaky, clumsy pulling down her mask HD access [facility] c/d/i   Results for orders placed or performed during the hospital encounter of 04/26/24 (from the past 48 hours)  Basic metabolic panel     Status: Abnormal   Collection Time: 04/26/24  9:32 PM  Result Value Ref Range   Sodium 127 (L) 135 - 145 mmol/L   Potassium 5.4 (H) 3.5 - 5.1 mmol/L   Chloride 92 (L) 98 - 111 mmol/L   CO2 17 (L) 22 - 32 mmol/L   Glucose, Bld 73 70 - 99 mg/dL    Comment: Glucose reference range applies only to samples taken after fasting for at least 8 hours.   BUN 58 (H) 8 - 23 mg/dL   Creatinine, Ser 89.23 (H) 0.44 - 1.00 mg/dL   Calcium  9.1 8.9 - 10.3 mg/dL   GFR, Estimated 4 (L) >60 mL/min    Comment: (NOTE) Calculated using the CKD-EPI Creatinine Equation (2021)    Anion gap 18 (H) 5 - 15    Comment: Performed at [facility], [address], [place], [place] [address]  CBC     Status: Abnormal   Collection Time: 04/26/24  9:32 PM  Result Value Ref Range   WBC 5.6 4.0 - 10.5 K/uL   RBC 3.22 (L) 3.87 - 5.11 MIL/uL  Hemoglobin 9.4  (L) 12.0 - 15.0 g/dL   HCT 71.6 (L) 63.9 - 53.9 %   MCV 87.9 80.0 - 100.0 fL   MCH 29.2 26.0 - 34.0 pg   MCHC 33.2 30.0 - 36.0 g/dL   RDW 83.3 (H) 88.4 - 84.4 %   Platelets 191 150 - 400 K/uL   nRBC 0.0 0.0 - 0.2 %    Comment: Performed at [facility], [address], [place], [place] [address]  Troponin I (High Sensitivity)     Status: None   Collection Time: 04/26/24  9:32 PM  Result Value Ref Range   Troponin I (High Sensitivity) 17 <18 ng/L    Comment: (NOTE) Elevated high sensitivity troponin I (hsTnI) values and significant  changes across serial measurements may suggest ACS but many other  chronic and acute conditions are known to elevate hsTnI results.  Refer to the Links section for chest pain algorithms and additional  guidance. Performed at [facility], [address], [place], [place] [address]   I-stat chem 8, ed     Status: Abnormal   Collection Time: 04/26/24  9:38 PM  Result Value Ref Range   Sodium 127 (L) 135 - 145 mmol/L   Potassium 5.4 (H) 3.5 - 5.1 mmol/L   Chloride 96 (L) 98 - 111 mmol/L   BUN 53 (H) 8 - 23 mg/dL   Creatinine, Ser 88.69 (H) 0.44 - 1.00 mg/dL   Glucose, Bld 76 70 - 99 mg/dL    Comment: Glucose reference range applies only to samples taken after fasting for at least 8 hours.   Calcium , Ion 1.03 (L) 1.15 - 1.40 mmol/L   TCO2 18 (L) 22 - 32 mmol/L   Hemoglobin 10.5 (L) 12.0 - 15.0 g/dL   HCT 68.9 (L) 63.9 - 53.9 %  Urinalysis, Routine w reflex microscopic -Urine, Clean Catch     Status: Abnormal   Collection Time: 04/26/24 10:43 PM  Result Value Ref Range   Color, Urine YELLOW YELLOW   APPearance HAZY (A) CLEAR   Specific Gravity, Urine 1.007 1.005 - 1.030   pH 8.0 5.0 - 8.0   Glucose, UA NEGATIVE NEGATIVE mg/dL   Hgb urine dipstick NEGATIVE NEGATIVE   Bilirubin Urine NEGATIVE NEGATIVE   Ketones, ur NEGATIVE NEGATIVE mg/dL   Protein, ur >=699 (A) NEGATIVE mg/dL   Nitrite NEGATIVE NEGATIVE   Leukocytes,Ua NEGATIVE  NEGATIVE   RBC / HPF 0-5 0 - 5 RBC/hpf   WBC, UA 0-5 0 - 5 WBC/hpf   Bacteria, UA FEW (A) NONE SEEN   Squamous Epithelial / HPF 0-5 0 - 5 /HPF    Comment: Performed at [facility], [address], [place], [place] [address]    DG Chest 2 View Result Date: 04/26/2024 CLINICAL DATA:  Shortness of breath EXAM: CHEST - 2 VIEW COMPARISON:  01/03/2024 FINDINGS: Cardiomegaly, vascular congestion. No confluent airspace opacities, effusions or edema. No acute bony abnormality. Right dialysis catheter unchanged. IMPRESSION: Cardiomegaly, vascular congestion. Electronically Signed   By: Franky Crease M.D.   On: 04/26/2024 22:02   [facility] MWF 4h TDC  BFR 500 DFR auto 1.5, EDW 67.1 3K/2.5Ca Last tx 9/1, frequently misses  Mircera 60 q2wks Venofer  50 weekly  Assessment/PlanDorothy Elayah [name] is an 63 y.o. female with ESRD on HD with frequent misses, DM, GERD, Parkinson's disease, cocaine  use currently presenting with weakness and seen by nephrology for ESRD co management.  **Weakness/lethargy:  missed HD and substance  abuse - having HD now, doubt mainly uremia with BUN in 50s but plan is to return to ED after HD for re evaluation.  Suspect she'll need to come in if she remains weak and lethargic.   **ESRD on HD:  HD today, usual MWF schedule, likely need HD Wed but if is admitted will assess tomorrow if extra tx would be beneficial.   **Anemia:  Hb 10s.   **Hyperkalemia: rec'd lokelma , HD now.  Renal diet.    **BMM: Check PTH. Not on VDRA or calcimimetic outpt.  Check phos here, cont outpt binder.   **DM: per primary.   **Cocaine  use: needs cessation counseling when alert.   Will follow, reach out with concerns.  Carol [name] 04/27/2024, 8:55 AM

## 2024-04-27 NOTE — ED Provider Notes (Cosign Needed Addendum)
    BP (!) 155/70   Pulse (!) 57   Temp 98.3 F (36.8 C) (Oral)   Resp 13   Ht 5' 4 (1.626 m)   Wt 63.5 kg   SpO2 98%   BMI 24.03 kg/m   Accepted handoff at shift change from [employer]. Please see prior provider note for more detail.   Briefly: Patient is 63 y.o. F presents after missing multiple dialysis sessions.  Plan is for her to go to dialysis this morning.   Patient already had dialysis on evaluation.  ADDENDUM: The patient has returned from dialysis. She reports that she is feeling significantly better than when she first arrived. She reports that she is ready to go home.  Discharged home with return precautions.    Bernis Ernst, PA-C 04/27/24 [id number]    Bernis Ernst, PA-C 04/27/24 [id number]    Elnor Jayson LABOR, DO 04/28/24 [phone]

## 2024-04-28 LAB — HEPATITIS B SURFACE ANTIBODY, QUANTITATIVE: Hep B S AB Quant (Post): 34 m[IU]/mL

## 2024-05-01 ENCOUNTER — Other Ambulatory Visit (HOSPITAL_COMMUNITY): Payer: Self-pay | Admitting: Family Medicine

## 2024-05-27 LAB — YEAST SUSCEPTIBILITIES
Amphotericin B MIC: 1
Fluconazole Islt MIC: 2
ISAVUCONAZOLE MIC: 0.12
Itraconazole MIC: 0.25
Posaconazole MIC: 1

## 2024-06-04 ENCOUNTER — Ambulatory Visit

## 2024-06-04 ENCOUNTER — Ambulatory Visit (HOSPITAL_COMMUNITY): Admission: RE | Admit: 2024-06-04 | Source: Ambulatory Visit

## 2024-06-07 ENCOUNTER — Emergency Department (HOSPITAL_COMMUNITY)
Admission: EM | Admit: 2024-06-07 | Discharge: 2024-06-07 | Disposition: A | Discharge status: Home / Self Care | Attending: Emergency Medicine | Admitting: Emergency Medicine

## 2024-06-07 ENCOUNTER — Emergency Department (HOSPITAL_COMMUNITY)

## 2024-06-07 ENCOUNTER — Encounter (HOSPITAL_COMMUNITY): Payer: Self-pay | Admitting: Emergency Medicine

## 2024-06-07 DIAGNOSIS — F331 Major depressive disorder, recurrent, moderate: Secondary | ICD-10-CM | POA: Diagnosis not present

## 2024-06-07 DIAGNOSIS — F17210 Nicotine dependence, cigarettes, uncomplicated: Secondary | ICD-10-CM | POA: Insufficient documentation

## 2024-06-07 DIAGNOSIS — J449 Chronic obstructive pulmonary disease, unspecified: Secondary | ICD-10-CM | POA: Insufficient documentation

## 2024-06-07 DIAGNOSIS — Z7951 Long term (current) use of inhaled steroids: Secondary | ICD-10-CM | POA: Diagnosis not present

## 2024-06-07 DIAGNOSIS — F1410 Cocaine abuse, uncomplicated: Secondary | ICD-10-CM | POA: Diagnosis not present

## 2024-06-07 DIAGNOSIS — Z8673 Personal history of transient ischemic attack (TIA), and cerebral infarction without residual deficits: Secondary | ICD-10-CM | POA: Insufficient documentation

## 2024-06-07 DIAGNOSIS — I120 Hypertensive chronic kidney disease with stage 5 chronic kidney disease or end stage renal disease: Secondary | ICD-10-CM | POA: Diagnosis not present

## 2024-06-07 DIAGNOSIS — N186 End stage renal disease: Secondary | ICD-10-CM | POA: Insufficient documentation

## 2024-06-07 DIAGNOSIS — N3000 Acute cystitis without hematuria: Secondary | ICD-10-CM | POA: Insufficient documentation

## 2024-06-07 DIAGNOSIS — R251 Tremor, unspecified: Secondary | ICD-10-CM | POA: Diagnosis present

## 2024-06-07 DIAGNOSIS — R4182 Altered mental status, unspecified: Secondary | ICD-10-CM | POA: Insufficient documentation

## 2024-06-07 DIAGNOSIS — Z79899 Other long term (current) drug therapy: Secondary | ICD-10-CM | POA: Insufficient documentation

## 2024-06-07 DIAGNOSIS — E1122 Type 2 diabetes mellitus with diabetic chronic kidney disease: Secondary | ICD-10-CM | POA: Insufficient documentation

## 2024-06-07 DIAGNOSIS — Z992 Dependence on renal dialysis: Secondary | ICD-10-CM | POA: Diagnosis not present

## 2024-06-07 DIAGNOSIS — Z7982 Long term (current) use of aspirin: Secondary | ICD-10-CM | POA: Diagnosis not present

## 2024-06-07 LAB — COMPREHENSIVE METABOLIC PANEL WITH GFR
ALT: 18 U/L (ref 0–44)
AST: 27 U/L (ref 15–41)
Albumin: 2.7 g/dL — ABNORMAL LOW (ref 3.5–5.0)
Alkaline Phosphatase: 109 U/L (ref 38–126)
Anion gap: 14 (ref 5–15)
BUN: 55 mg/dL — ABNORMAL HIGH (ref 8–23)
CO2: 18 mmol/L — ABNORMAL LOW (ref 22–32)
Calcium: 9.3 mg/dL (ref 8.9–10.3)
Chloride: 99 mmol/L (ref 98–111)
Creatinine, Ser: 9.91 mg/dL — ABNORMAL HIGH (ref 0.44–1.00)
GFR, Estimated: 4 mL/min — ABNORMAL LOW (ref >60)
Glucose, Bld: 81 mg/dL (ref 70–99)
Potassium: 5.2 mmol/L — ABNORMAL HIGH (ref 3.5–5.1)
Sodium: 131 mmol/L — ABNORMAL LOW (ref 135–145)
Total Bilirubin: 0.8 mg/dL (ref 0.0–1.2)
Total Protein: 7 g/dL (ref 6.5–8.1)

## 2024-06-07 LAB — CBC
HCT: 29.7 % — ABNORMAL LOW (ref 36.0–46.0)
Hemoglobin: 9.5 g/dL — ABNORMAL LOW (ref 12.0–15.0)
MCH: 28.8 pg (ref 26.0–34.0)
MCHC: 32 g/dL (ref 30.0–36.0)
MCV: 90 fL (ref 80.0–100.0)
Platelets: 224 K/uL (ref 150–400)
RBC: 3.3 MIL/uL — ABNORMAL LOW (ref 3.87–5.11)
RDW: 15.9 % — ABNORMAL HIGH (ref 11.5–15.5)
WBC: 5.4 K/uL (ref 4.0–10.5)
nRBC: 0 % (ref 0.0–0.2)

## 2024-06-07 LAB — URINALYSIS, W/ REFLEX TO CULTURE (INFECTION SUSPECTED)
Bilirubin Urine: NEGATIVE
Glucose, UA: NEGATIVE mg/dL
Hgb urine dipstick: NEGATIVE
Ketones, ur: NEGATIVE mg/dL
Nitrite: NEGATIVE
Protein, ur: >=300 mg/dL — AB
Specific Gravity, Urine: 1.009 (ref 1.005–1.030)
WBC, UA: >50 WBC/hpf (ref 0–5)
pH: 8 (ref 5.0–8.0)

## 2024-06-07 LAB — RAPID URINE DRUG SCREEN, HOSP PERFORMED
Amphetamines: NOT DETECTED
Barbiturates: NOT DETECTED
Benzodiazepines: NOT DETECTED
Cocaine: POSITIVE — AB
Opiates: NOT DETECTED
Tetrahydrocannabinol: NOT DETECTED

## 2024-06-07 LAB — RESP PANEL BY RT-PCR (RSV, FLU A&B, COVID)  RVPGX2
Influenza A by PCR: NEGATIVE
Influenza B by PCR: NEGATIVE
Resp Syncytial Virus by PCR: NEGATIVE
SARS Coronavirus 2 by RT PCR: NEGATIVE

## 2024-06-07 MED ORDER — CEFDINIR 300 MG PO CAPS: 300.0000 mg | ORAL_CAPSULE | Freq: Every day | ORAL | 0 refills | Status: AC

## 2024-06-07 MED ORDER — SODIUM CHLORIDE 0.9 % IV BOLUS
500.0000 mL | Freq: Once | INTRAVENOUS | Status: AC
  Administered 2024-06-07: 500 mL via INTRAVENOUS

## 2024-06-07 MED ORDER — SODIUM CHLORIDE 0.9 % IV SOLN
1.0000 g | Freq: Once | INTRAVENOUS | Status: AC
  Administered 2024-06-07: 1 g via INTRAVENOUS
  Filled 2024-06-07: qty 10

## 2024-06-07 MED ORDER — AMLODIPINE BESYLATE 5 MG PO TABS
10.0000 mg | ORAL_TABLET | Freq: Once | ORAL | Status: AC
  Administered 2024-06-07: 10 mg via ORAL
  Filled 2024-06-07: qty 2

## 2024-06-07 MED ORDER — SODIUM ZIRCONIUM CYCLOSILICATE 10 G PO PACK
10.0000 g | PACK | Freq: Once | ORAL | Status: AC
  Administered 2024-06-07: 10 g via ORAL
  Filled 2024-06-07: qty 1

## 2024-06-07 NOTE — Discharge Instructions (Signed)
 We evaluated you for your tremor and weakness.  Your testing shows that you may have a urine infection.  We have given you a dose of antibiotics in the emergency department.  We have sent you a prescription to take at home.  Please take these as prescribed.  Please also do not use cocaine .  This can cause tremor.  This is especially unsafe for dialysis patients.  Cocaine  can cause bleeding in your brain, heart attacks, and other dangerous problems.  Please follow-up closely with your primary doctor and return for any new or worsening symptoms.

## 2024-06-07 NOTE — ED Provider Notes (Signed)
 [place] [facility] Provider Note  CSN: [id number] Arrival date & time: 06/07/24 0248  Chief Complaint(s) Tremors  HPI Carol [name] is a 63 y.o. female history of diabetes, end-stage renal disease on dialysis, hypertension, reported history of Parkinson's disease although patient reports she is not sure of this diagnosis presenting with tremor.  Patient reports she has a tremor for the past few days.  Also reports some low back pain without fall.  Reports some chills.  No fevers.  Denies any chest pain, shortness of breath, abdominal pain, headaches, nausea or vomiting.  She does not think she was started on any new medicines lately but she is not sure.  Does report recent fall but denies any injury.  She also reports some generalized weakness.  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   Chronic back pain    Depression    Diabetes mellitus    ESRD (end stage renal disease) on dialysis [facility])    M,W,F   GERD (gastroesophageal reflux disease)    Heart murmur    Hypertension    NSTEMI (non-ST elevated myocardial infarction) (HCC) 12/20/2021   Palpitations    Parkinson's disease (HCC)    Patient Active Problem List   Diagnosis Date Noted   Bacteremia 01/10/2024   Cholecystitis 01/10/2024   Candidemia (HCC) 01/05/2024   Sepsis (HCC) 01/03/2024   Hypokalemia 08/11/2023   History of COPD 08/11/2023   History of anemia due to chronic kidney disease 08/11/2023   End stage renal disease (HCC) 08/03/2023   Overweight (BMI 25.0-29.9) 08/03/2023   Chest pain 07/31/2023   Hypertensive emergency 07/31/2023   Anemia of chronic disease 07/31/2023   Chronic pain 07/31/2023   Polysubstance abuse (HCC) 07/31/2023   COPD (chronic obstructive pulmonary disease) (HCC)  03/27/2023   Chronic anemia 03/27/2023   Acute on chronic systolic (congestive) heart failure (HCC) 03/08/2023   Volume overload 03/07/2023   Heme positive stool 11/06/2022   Anemia due to chronic blood loss 11/06/2022   Colon cancer screening 11/03/2022   Hypoglycemia 11/02/2022   MDD (major depressive disorder), recurrent episode, moderate (HCC) 11/02/2022   Abnormal barium swallow 11/02/2022   Esophageal stricture 11/02/2022   Esophageal dysmotility 11/02/2022   Antiplatelet or antithrombotic long-term use 11/02/2022   Chronic diastolic CHF (congestive heart failure) (HCC) 11/01/2022   Dysphagia 11/01/2022   Acute on chronic diastolic CHF (congestive heart failure) (HCC) 05/09/2022   Metabolic acidosis, increased anion gap 05/09/2022   Acute on chronic diastolic (congestive) heart failure (HCC) 03/22/2022   COPD not affecting current episode of care 03/22/2022   Class 2 obesity due to excess calories with body mass index (BMI) of 37.0 to 37.9 in adult 03/22/2022   Hypomagnesemia 03/15/2022   CHF (congestive heart failure) (HCC) 03/14/2022   Acute metabolic encephalopathy 03/10/2022   borderline Prolonged QT interval 01/22/2022   Elevated troponin 01/22/2022   Thyroid  nodule 01/22/2022   Cocaine  use disorder, mild, abuse (HCC) 01/02/2022   Diabetic hypoglycemia (HCC) 12/31/2021   CKD (chronic kidney disease) stage 4, GFR 15-29 ml/min (HCC) 12/31/2021   CAD in native artery 12/24/2021   Anxiety with depression 12/24/2021   Pure hypercholesterolemia    DM2 (diabetes mellitus, type 2) (HCC) 01/29/2014   Essential hypertension 01/29/2014   Closed jaw fracture (  HCC) 01/29/2014   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 Authorizing Provider  cefdinir (OMNICEF) 300 MG capsule Take 1 capsule (300 mg total) by mouth daily for 7 days. 06/07/24 06/14/24 Yes Francesca Elsie CROME,  MD  albuterol  (PROVENTIL  HFA;VENTOLIN  HFA) 108 (90 Base) MCG/ACT inhaler Inhale 2 puffs into the lungs every 4 (four) hours as needed for wheezing or shortness of breath.    [provider]  albuterol  (VENTOLIN  HFA) 108 (90 Base) MCG/ACT inhaler Inhale 1-2 puffs into the lungs every 6 (six) hours as needed for wheezing or shortness of breath. 08/25/23   Elnor Jayson LABOR, DO  amLODipine  (NORVASC ) 10 MG tablet Take 1 tablet (10 mg total) by mouth daily. 11/01/22   Glena Harlene HERO, FNP  aspirin  EC 81 MG tablet Take 1 tablet (81 mg total) by mouth daily. Swallow whole. 05/11/22   Lemon Harlene, MD  atorvastatin  (LIPITOR ) 80 MG tablet Take 1 tablet (80 mg total) by mouth daily. 03/11/23   Lue Elsie BROCKS, MD  budesonide -formoterol  [facility]) 160-4.5 MCG/ACT inhaler Inhale 2 puffs into the lungs in the morning and at bedtime. 11/09/21   [provider]  cetirizine (ZYRTEC) 10 MG tablet Take 10 mg by mouth daily as needed for allergies.    [provider]  Darbepoetin Alfa  (ARANESP ) 60 MCG/0.3ML SOSY injection Inject 0.3 mLs (60 mcg total) into the skin every Thursday at 6pm. 08/08/23   Dezii, Alexandra, DO  eszopiclone 3 MG TABS Take 3 mg by mouth at bedtime. Take immediately before bedtime    [provider]  fluconazole  (DIFLUCAN ) 200 MG tablet Take 1 tablet (200 mg total) by mouth at bedtime. 01/16/24   Arlon Carliss ORN, DO  gabapentin  (NEURONTIN ) 100 MG capsule Take 1 capsule (100 mg total) by mouth 2 (two) times daily. 08/14/23 01/03/24  Dezii, Alexandra, DO  glucagon  1 MG injection Follow package directions for low blood sugar. 08/14/23 08/13/24  Dezii, Alexandra, DO  guaiFENesin -dextromethorphan (ROBITUSSIN DM) 100-10 MG/5ML syrup Take 5 mLs by mouth every 4 (four) hours as needed for cough. 08/25/23   Elnor Jayson LABOR, DO  lidocaine  (LIDODERM ) 5 % Place 1 patch onto the skin daily. Remove & Discard patch within 12 hours or as directed by MD 12/10/23   Garrick Charleston,  MD  naloxone  (NARCAN ) nasal spray 4 mg/0.1 mL If concern for opioid overdose, spray in nostril. Call 911. 08/14/23   Dezii, Alexandra, DO  naphazoline-pheniramine (NAPHCON-A) 0.025-0.3 % ophthalmic solution Place 2 drops into both eyes 2 (two) times daily.    [provider]  nitroGLYCERIN  (NITROSTAT ) 0.4 MG SL tablet Place 1 tablet (0.4 mg total) under the tongue every 5 (five) minutes as needed for chest pain. 05/11/22 01/03/24  Lemon Harlene, MD  oxyCODONE -acetaminophen  (PERCOCET/ROXICET) 5-325 MG tablet Take 1 tablet by mouth every 8 (eight) hours as needed for severe pain (pain score 7-10). 12/10/23   Garrick Charleston, MD  pantoprazole  (PROTONIX ) 40 MG tablet Take 1 tablet (40 mg total) by mouth daily at 12 noon. Patient taking differently: Take 40 mg by mouth at bedtime. 05/11/22   Lemon Harlene, MD  promethazine -dextromethorphan (PROMETHAZINE -DM) 6.25-15 MG/5ML syrup Take 5 mLs by mouth 4 (four) times daily as needed for cough. 11/07/23   Patt Alm Macho, MD  triamcinolone  ointment (KENALOG ) 0.1 % 1 Application 2 (two) times daily as needed. 06/29/23   [provider]  Past Surgical History Past Surgical History:  Procedure Laterality Date   AV FISTULA PLACEMENT Left 08/01/2023   Procedure: LEFT ARM BRACHIOBASILIC FISTULA CREATION;  Surgeon: Pearline Norman RAMAN, MD;  Location: [facility] OR;  Service: Vascular;  Laterality: Left;   BALLOON DILATION N/A 11/06/2022   Procedure: BALLOON DILATION;  Surgeon: Shila Gustav GAILS, MD;  Location: [facility] ENDOSCOPY;  Service: Gastroenterology;  Laterality: N/A;   CARPAL TUNNEL RELEASE     CHOLECYSTECTOMY N/A 01/10/2024   Procedure: LAPAROSCOPIC CHOLECYSTECTOMY, ATTEMPTED;  Surgeon: Polly Cordella LABOR, MD;  Location: [facility] OR;  Service: General;  Laterality: N/A;   CHOLECYSTECTOMY N/A 01/10/2024   Procedure: OPEN  CHOLECYSTECTOMY;  Surgeon: Polly Cordella LABOR, MD;  Location: [facility] OR;  Service: General;  Laterality: N/A;   COLONOSCOPY WITH PROPOFOL  N/A 11/06/2022   Procedure: COLONOSCOPY WITH PROPOFOL ;  Surgeon: Shila Gustav GAILS, MD;  Location: [facility] ENDOSCOPY;  Service: Gastroenterology;  Laterality: N/A;   ESOPHAGOGASTRODUODENOSCOPY (EGD) WITH PROPOFOL  N/A 11/06/2022   Procedure: ESOPHAGOGASTRODUODENOSCOPY (EGD) WITH PROPOFOL ;  Surgeon: Shila Gustav GAILS, MD;  Location: [facility] ENDOSCOPY;  Service: Gastroenterology;  Laterality: N/A;   INDOCYANINE GREEN  FLUORESCENCE IMAGING (ICG) N/A 01/10/2024   Procedure: INDOCYANINE GREEN  FLUORESCENCE IMAGING (ICG);  Surgeon: Polly Cordella LABOR, MD;  Location: [facility] OR;  Service: General;  Laterality: N/A;   INSERTION OF DIALYSIS CATHETER Right 08/01/2023   Procedure: INSERTION OF TUNNELED DIALYSIS CATHETER;  Surgeon: Pearline Norman RAMAN, MD;  Location[facility] OR;  Service: Vascular;  Laterality: Right;   IR FLUORO GUIDE CV LINE RIGHT  01/26/2022   IR FLUORO GUIDE CV LINE RIGHT  01/08/2024   IR REMOVAL TUN CV CATH W/O FL  01/06/2024   IR US  GUIDE VASC ACCESS RIGHT  01/26/2022   IR US  GUIDE VASC ACCESS RIGHT  01/08/2024   LEFT HEART CATH AND CORONARY ANGIOGRAPHY N/A 12/20/2021   Procedure: LEFT HEART CATH AND CORONARY ANGIOGRAPHY;  Surgeon: Verlin Lonni BIRCH, MD;  Location: [facility] INVASIVE CV LAB;  Service: Cardiovascular;  Laterality: N/A;   OPEN REDUCTION INTERNAL FIXATION (ORIF) FINGER WITH RADIAL BONE GRAFT Right 09/12/2015   Procedure: RIGHT MIDDLE FINGER CLOSED REDUCTION AND PINNING;  Surgeon: Prentice Pagan, MD;  Location: [facility] OR;  Service: Orthopedics;  Laterality: Right;   TONSILLECTOMY     TRANSESOPHAGEAL ECHOCARDIOGRAM (CATH LAB) N/A 01/09/2024   Procedure: TRANSESOPHAGEAL ECHOCARDIOGRAM;  Surgeon: Mona Vinie BROCKS, MD;  Location: [facility] INVASIVE CV LAB;  Service: Cardiovascular;  Laterality: N/A;   TUBAL LIGATION     Family History Family History  Problem Relation Age of Onset    Diabetes Mother    Kidney disease Mother    Seizures Father    Hypertension Father    Stroke Father    Asthma Other     Social History Social History   Tobacco Use   Smoking status: Some Days    Current packs/day: 0.25    Average packs/day: 0.3 packs/day for 3.0 years (0.8 ttl pk-yrs)    Types: Cigarettes    Passive exposure: Current   Smokeless tobacco: Never  Vaping Use   Vaping status: Never Used  Substance Use Topics   Alcohol use: Yes    Alcohol/week: 0.0 standard drinks of alcohol    Comment: occasional   Drug use: Yes    Types: Cocaine    Allergies Penicillins and Ultram  [tramadol ]  Review of Systems Review of Systems  All other systems reviewed and are negative.   Physical Exam Vital Signs  I have reviewed the triage vital signs BP (!) 202/102  Pulse 85   Temp 98.3 F (36.8 C) (Rectal)   Resp (!) 24   SpO2 95%  Physical Exam Vitals and nursing note reviewed.  Constitutional:      General: She is not in acute distress.    Appearance: She is well-developed.  HENT:     Head: Normocephalic and atraumatic.     Mouth/Throat:     Mouth: Mucous membranes are moist.  Eyes:     Pupils: Pupils are equal, round, and reactive to light.  Cardiovascular:     Rate and Rhythm: Normal rate and regular rhythm.     Heart sounds: No murmur heard. Pulmonary:     Effort: Pulmonary effort is normal. No respiratory distress.     Breath sounds: Normal breath sounds.  Abdominal:     General: Abdomen is flat.     Palpations: Abdomen is soft.     Tenderness: There is no abdominal tenderness. There is no right CVA tenderness or left CVA tenderness.  Musculoskeletal:        General: No tenderness.     Right lower leg: No edema.     Left lower leg: No edema.     Comments: No midline C, T, L-spine tenderness.  Mild bilateral paraspinal lumbar tenderness  Skin:    General: Skin is warm and dry.  Neurological:     General: No focal deficit present.     Mental Status:  She is alert. Mental status is at baseline.     Comments: Moves all four extremities equally, no cranial nerve deficit.  Occasional intentional tremor  Psychiatric:        Mood and Affect: Mood normal.        Behavior: Behavior normal.     ED Results and Treatments Labs (all labs ordered are listed, but only abnormal results are displayed) Labs Reviewed  CBC - Abnormal; Notable for the following components:      Result Value   RBC 3.30 (*)    Hemoglobin 9.5 (*)    HCT 29.7 (*)    RDW 15.9 (*)    All other components within normal limits  COMPREHENSIVE METABOLIC PANEL WITH GFR - Abnormal; Notable for the following components:   Sodium 131 (*)    Potassium 5.2 (*)    CO2 18 (*)    BUN 55 (*)    Creatinine, Ser 9.91 (*)    Albumin  2.7 (*)    GFR, Estimated 4 (*)    All other components within normal limits  URINALYSIS, W/ REFLEX TO CULTURE (INFECTION SUSPECTED) - Abnormal; Notable for the following components:   APPearance HAZY (*)    Protein, ur >=300 (*)    Leukocytes,Ua MODERATE (*)    Bacteria, UA RARE (*)    All other components within normal limits  RAPID URINE DRUG SCREEN, HOSP PERFORMED - Abnormal; Notable for the following components:   Cocaine  POSITIVE (*)    All other components within normal limits  RESP PANEL BY RT-PCR (RSV, FLU A&B, COVID)  RVPGX2  URINE CULTURE  MAGNESIUM   Radiology CT Head Wo Contrast Result Date: 06/07/2024 CLINICAL DATA:  Altered mental status.  Tremors. EXAM: CT HEAD WITHOUT CONTRAST TECHNIQUE: Contiguous axial images were obtained from the base of the skull through the vertex without intravenous contrast. RADIATION DOSE REDUCTION: This exam was performed according to the departmental dose-optimization program which includes automated exposure control, adjustment of the mA and/or kV according to patient size and/or use of  iterative reconstruction technique. COMPARISON:  08/11/2023 FINDINGS: Brain: No evidence of intracranial hemorrhage, acute infarction, hydrocephalus, extra-axial collection, or mass lesion/mass effect. 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 Vascular:  No hyperdense vessel or other acute findings. Skull: No evidence of fracture or other significant bone abnormality. Sinuses/Orbits:  No acute findings. Other: None. IMPRESSION: No acute intracranial abnormality. 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 Electronically Signed   By: Norleen DELENA Kil M.D.   On: 06/07/2024 18:06   DG Chest Portable 1 View Result Date: 06/07/2024 CLINICAL DATA:  Weakness. EXAM: PORTABLE CHEST 1 VIEW COMPARISON:  04/26/2024 FINDINGS: Right-sided dialysis catheter remains in place. Cardiomegaly is stable. Mediastinal contours are unchanged. Vascular congestion with subtle septal thickening at the bases suspicious for pulmonary edema. No confluent opacity, large pleural effusion or pneumothorax. The bones are subjectively under mineralized IMPRESSION: Cardiomegaly with vascular congestion and probable mild pulmonary edema. Electronically Signed   By: Andrea Gasman M.D.   On: 06/07/2024 16:14    Pertinent labs & imaging results that were available during my care of the patient were reviewed by me and considered in my medical decision making (see MDM for details).  Medications Ordered in ED Medications  sodium zirconium cyclosilicate  (LOKELMA ) packet 10 g (has no administration in time range)  sodium chloride  0.9 % bolus 500 mL (0 mLs Intravenous Stopped 06/07/24 1840)  amLODipine  (NORVASC ) tablet 10 mg (10 mg Oral Given 06/07/24 1838)  cefTRIAXone  (ROCEPHIN ) 1 g in sodium chloride  0.9 % 100 mL IVPB (0 g Intravenous Stopped 06/07/24 2011)                                                                                                                                      Procedures Procedures  (including critical care time)  Medical Decision Making / ED Course   MDM:  63 year old presenting to the emergency department tremor.  Patient overall well-appearing, physical exam without focal process  Differential for symptoms is broad, includes new medication although patient denies any new medication.  She is on gabapentin  which could potentially trigger tremors although it does not seem as needed.  Differential also includes electrolyte problem however sodium is reassuring.  She does have a documented history of Parkinson's disease although patient is not sure if she actually has Parkinson's disease or not.  She does report some urinary symptoms and possible may have a UTI.  Differential symptoms intracranial process will obtain CT head.  She does report some low back pain but denies any midline tenderness, fall, fevers has normal lower extremity strength, doubt occult fracture, occult spinal cord process, or occult spinal infection.  Will reassess.  Clinical Course as of 06/07/24 2032  [place] Jun 07, 2024  2023 UA is concerning for UTI.  Will give dose of ceftriaxone .  May be related to her symptoms specially with dysuria.  UDS was also obtained and positive for cocaine .  Patient does endorse cocaine  use over the past few days.  This could certainly be causing her tremor.  Advised that she should definitely not be using cocaine  especially given her age and medical comorbidities.  Blood pressure has been elevated while patient is in the emergency department but she denies any chest pain or headache, CT head negative for any intracranial bleeding or other process.  She reports she did not take her blood pressure medication today.  I have given her dose of this today.  She reports that she is dialysis scheduled for tomorrow and will be compliant with this.  She does not seem to have any symptoms for her  hypertension.  Feel that she is stable for discharge especially given close outpatient dialysis follow up. Will discharge patient to home. All questions answered. Patient comfortable with plan of discharge. Return precautions discussed with patient and specified on the after visit summary.  [WS]    Clinical Course User Index [WS] Francesca Elsie CROME, MD     Additional history obtained: -Additional history obtained from spouse -External records from outside source obtained and reviewed including: Chart review including previous notes, labs, imaging, consultation notes including prior notes    Lab Tests: -I ordered, reviewed, and interpreted labs.   The pertinent results include:   Labs Reviewed  CBC - Abnormal; Notable for the following components:      Result Value   RBC 3.30 (*)    Hemoglobin 9.5 (*)    HCT 29.7 (*)    RDW 15.9 (*)    All other components within normal limits  COMPREHENSIVE METABOLIC PANEL WITH GFR - Abnormal; Notable for the following components:   Sodium 131 (*)    Potassium 5.2 (*)    CO2 18 (*)    BUN 55 (*)    Creatinine, Ser 9.91 (*)    Albumin  2.7 (*)    GFR, Estimated 4 (*)    All other components within normal limits  URINALYSIS, W/ REFLEX TO CULTURE (INFECTION SUSPECTED) - Abnormal; Notable for the following components:   APPearance HAZY (*)    Protein, ur >=300 (*)    Leukocytes,Ua MODERATE (*)    Bacteria, UA RARE (*)    All other components within normal limits  RAPID URINE DRUG SCREEN, HOSP PERFORMED - Abnormal; Notable for the following components:   Cocaine  POSITIVE (*)    All other components within normal limits  RESP PANEL BY RT-PCR (RSV, FLU A&B, COVID)  RVPGX2  URINE CULTURE  MAGNESIUM     Notable for UDS + cocaine , UTI   EKG   EKG Interpretation Date/Time:  Sunday June 07 2024 15:54:35 EDT Ventricular Rate:  84 PR Interval:  151 QRS Duration:  105 QT Interval:  399 QTC Calculation: 472 R Axis:   -13  Text  Interpretation: Sinus rhythm Confirmed by Francesca Elsie ([id number]) on 06/07/2024 5:04:01 PM         Imaging Studies ordered: I ordered imaging studies including CT head ,  CXR On my interpretation imaging demonstrates no acute process, possible mild pulm edema w/o hypoxia or shob I independently visualized and interpreted imaging. I agree with the radiologist interpretation   Medicines ordered and prescription drug management: Meds ordered this encounter  Medications   sodium chloride  0.9 % bolus 500 mL   amLODipine  (NORVASC ) tablet 10 mg   cefTRIAXone  (ROCEPHIN ) 1 g in sodium chloride  0.9 % 100 mL IVPB    Antibiotic Indication::   UTI   cefdinir (OMNICEF) 300 MG capsule    Sig: Take 1 capsule (300 mg total) by mouth daily for 7 days.    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    Final Clinical Impression(s) / ED Diagnoses Final diagnoses:  Tremor   Acute cystitis without hematuria  Cocaine  abuse (HCC)     This chart was dictated using voice recognition software.  Despite best efforts to proofread,  errors can occur which can change the documentation meaning.    Francesca Elsie CROME, MD 06/07/24 2032

## 2024-06-07 NOTE — ED Triage Notes (Signed)
 Pt here from home with c/o generalized tremors that started yesterday , pt is on dialysis , has not missed any treatments cbg [id number]

## 2024-06-09 LAB — URINE CULTURE: Culture: >=100000 — AB

## 2024-06-10 ENCOUNTER — Telehealth (HOSPITAL_BASED_OUTPATIENT_CLINIC_OR_DEPARTMENT_OTHER): Payer: Self-pay | Admitting: *Deleted

## 2024-06-10 NOTE — Progress Notes (Signed)
 ED Antimicrobial Stewardship Positive Culture Follow Up   Carol [name] is an 63 y.o. female who presented to [facility] with a chief complaint of  Chief Complaint  Patient presents with   Tremors    Recent Results (from the past 720 hours)  Resp panel by RT-PCR (RSV, Flu A&B, Covid) Anterior Nasal Swab     Status: None   Collection Time: 06/07/24  3:36 PM   Specimen: Anterior Nasal Swab  Result Value Ref Range Status   SARS Coronavirus 2 by RT PCR NEGATIVE NEGATIVE Final   Influenza A by PCR NEGATIVE NEGATIVE Final   Influenza B by PCR NEGATIVE NEGATIVE Final    Comment: (NOTE) The Xpert Xpress SARS-CoV-2/FLU/RSV plus assay is intended as an aid in the diagnosis of influenza from Nasopharyngeal swab specimens and should not be used as a sole basis for treatment. Nasal washings and aspirates are unacceptable for Xpert Xpress SARS-CoV-2/FLU/RSV testing.  Fact Sheet for Patients: BloggerCourse.com  Fact Sheet for Healthcare Providers: SeriousBroker.it  This test is not yet approved or cleared by the [place]  FDA and has been authorized for detection and/or diagnosis of SARS-CoV-2 by FDA under an Emergency Use Authorization (EUA). This EUA will remain in effect (meaning this test can be used) for the duration of the COVID-19 declaration under Section 564(b)(1) of the Act, 21 U.S.C. section 360bbb-3(b)(1), unless the authorization is terminated or revoked.     Resp Syncytial Virus by PCR NEGATIVE NEGATIVE Final    Comment: (NOTE) Fact Sheet for Patients: BloggerCourse.com  Fact Sheet for Healthcare Providers: SeriousBroker.it  This test is not yet approved or cleared by the [place]  FDA and has been authorized for detection and/or diagnosis of SARS-CoV-2 by FDA under an Emergency Use Authorization (EUA). This EUA will remain in effect (meaning this test  can be used) for the duration of the COVID-19 declaration under Section 564(b)(1) of the Act, 21 U.S.C. section 360bbb-3(b)(1), unless the authorization is terminated or revoked.  Performed at [facility], [address] [place], [place] [address]   Urine Culture     Status: Abnormal   Collection Time: 06/07/24  3:36 PM   Specimen: Urine, Random  Result Value Ref Range Status   Specimen Description URINE, RANDOM  Final   Special Requests   Final    NONE Reflexed from [id number] Performed at [facility], [address] [place], [place] [address]    Culture >=100,000 COLONIES/mL ESCHERICHIA COLI (A)  Final   Report Status 06/09/2024 FINAL  Final   Organism ID, Bacteria ESCHERICHIA COLI (A)  Final      Susceptibility   Escherichia coli - MIC*    AMPICILLIN >=32 RESISTANT Resistant     CEFAZOLIN  (URINE) Value in next row Resistant      >=32 RESISTANTThis is a modified FDA-approved test that has been validated and its performance characteristics determined by the reporting laboratory.  This laboratory is certified under the Clinical Laboratory Improvement Amendments CLIA as qualified to perform high complexity clinical laboratory testing.    CEFEPIME Value in next row Sensitive      >=32 RESISTANTThis is a modified FDA-approved test that has been validated and its performance characteristics determined by the reporting laboratory.  This laboratory is certified under the Clinical Laboratory Improvement Amendments CLIA as qualified to perform high complexity clinical laboratory testing.    ERTAPENEM Value in next row Sensitive      >=32 RESISTANTThis is a modified FDA-approved test that  has been validated and its performance characteristics determined by the reporting laboratory.  This laboratory is certified under the Clinical Laboratory Improvement Amendments CLIA as qualified to perform high complexity clinical laboratory testing.    CEFTRIAXONE  Value in next row Intermediate       >=32 RESISTANTThis is a modified FDA-approved test that has been validated and its performance characteristics determined by the reporting laboratory.  This laboratory is certified under the Clinical Laboratory Improvement Amendments CLIA as qualified to perform high complexity clinical laboratory testing.    CIPROFLOXACIN  Value in next row Sensitive      >=32 RESISTANTThis is a modified FDA-approved test that has been validated and its performance characteristics determined by the reporting laboratory.  This laboratory is certified under the Clinical Laboratory Improvement Amendments CLIA as qualified to perform high complexity clinical laboratory testing.    GENTAMICIN Value in next row Sensitive      >=32 RESISTANTThis is a modified FDA-approved test that has been validated and its performance characteristics determined by the reporting laboratory.  This laboratory is certified under the Clinical Laboratory Improvement Amendments CLIA as qualified to perform high complexity clinical laboratory testing.    NITROFURANTOIN Value in next row Sensitive      >=32 RESISTANTThis is a modified FDA-approved test that has been validated and its performance characteristics determined by the reporting laboratory.  This laboratory is certified under the Clinical Laboratory Improvement Amendments CLIA as qualified to perform high complexity clinical laboratory testing.    TRIMETH /SULFA  Value in next row Sensitive      >=32 RESISTANTThis is a modified FDA-approved test that has been validated and its performance characteristics determined by the reporting laboratory.  This laboratory is certified under the Clinical Laboratory Improvement Amendments CLIA as qualified to perform high complexity clinical laboratory testing.    AMPICILLIN/SULBACTAM Value in next row Resistant      >=32 RESISTANTThis is a modified FDA-approved test that has been validated and its performance characteristics determined by the reporting  laboratory.  This laboratory is certified under the Clinical Laboratory Improvement Amendments CLIA as qualified to perform high complexity clinical laboratory testing.    PIP/TAZO Value in next row Sensitive      <=4 SENSITIVEThis is a modified FDA-approved test that has been validated and its performance characteristics determined by the reporting laboratory.  This laboratory is certified under the Clinical Laboratory Improvement Amendments CLIA as qualified to perform high complexity clinical laboratory testing.    MEROPENEM  Value in next row Sensitive      <=4 SENSITIVEThis is a modified FDA-approved test that has been validated and its performance characteristics determined by the reporting laboratory.  This laboratory is certified under the Clinical Laboratory Improvement Amendments CLIA as qualified to perform high complexity clinical laboratory testing.    * >=100,000 COLONIES/mL ESCHERICHIA COLI    Symptom check   If no symptoms - stop cefdinir  If symptoms - stop cefdinir, start cipro  500mg  q24h x 3 days.   New antibiotic prescription: Cipro  500mg  q24h x 3 days.   ED Provider: [name], PA-C   Powell Blush, PharmD, BCCCP  06/10/2024, 9:15 AM Clinical Pharmacist Monday - Friday phone -  [phone] Saturday - Sunday phone - [phone]

## 2024-06-10 NOTE — Telephone Encounter (Signed)
 Post ED Visit - Positive Culture Follow-up: Successful Patient Follow-Up  Culture assessed and recommendations reviewed by:  [x]  Powell Blush, Pharm.D. []  Venetia Gully, Pharm.D., BCPS AQ-ID []  Garrel Crews, Pharm.D., BCPS []  Almarie Lunger, Pharm.D., BCPS []  [place], [address].D., BCPS, AAHIVP []  Rosaline Bihari, Pharm.D., BCPS, AAHIVP []  Vernell Meier, PharmD, BCPS []  Latanya Hint, PharmD, BCPS []  Donald Medley, PharmD, BCPS []  Rocky Bold, PharmD  Positive urine culture  []  Patient discharged without antimicrobial prescription and treatment is now indicated [x]  Organism is resistant to prescribed ED discharge antimicrobial []  Patient with positive blood cultures  Changes discussed with ED provider: [name], PA-C  Contacted patient, date 06/10/24, time 1110 Conducted symptom check.  Pt states her symptoms have resolved and agrees to stop taking cefdinir per PA Zelaya's plan.  No further follow up needed.   Carol [name] 06/10/2024, 11:14 AM

## 2024-06-24 ENCOUNTER — Emergency Department (HOSPITAL_COMMUNITY)

## 2024-06-24 ENCOUNTER — Other Ambulatory Visit: Payer: Self-pay

## 2024-06-24 ENCOUNTER — Inpatient Hospital Stay (HOSPITAL_COMMUNITY)
Admission: EM | Admit: 2024-06-24 | Discharge: 2024-06-30 | DRG: 314 | Disposition: A | Discharge status: Home / Self Care | Attending: Internal Medicine | Admitting: Internal Medicine

## 2024-06-24 ENCOUNTER — Encounter (HOSPITAL_COMMUNITY): Payer: Self-pay

## 2024-06-24 DIAGNOSIS — Z8419 Family history of other disorders of kidney and ureter: Secondary | ICD-10-CM

## 2024-06-24 DIAGNOSIS — D631 Anemia in chronic kidney disease: Secondary | ICD-10-CM | POA: Diagnosis present

## 2024-06-24 DIAGNOSIS — Z1152 Encounter for screening for COVID-19: Secondary | ICD-10-CM | POA: Diagnosis not present

## 2024-06-24 DIAGNOSIS — Z833 Family history of diabetes mellitus: Secondary | ICD-10-CM

## 2024-06-24 DIAGNOSIS — B957 Other staphylococcus as the cause of diseases classified elsewhere: Secondary | ICD-10-CM | POA: Diagnosis present

## 2024-06-24 DIAGNOSIS — R7881 Bacteremia: Secondary | ICD-10-CM | POA: Diagnosis present

## 2024-06-24 DIAGNOSIS — Z885 Allergy status to narcotic agent status: Secondary | ICD-10-CM

## 2024-06-24 DIAGNOSIS — K7682 Hepatic encephalopathy: Secondary | ICD-10-CM | POA: Diagnosis present

## 2024-06-24 DIAGNOSIS — G20A1 Parkinson's disease without dyskinesia, without mention of fluctuations: Secondary | ICD-10-CM | POA: Diagnosis present

## 2024-06-24 DIAGNOSIS — N2581 Secondary hyperparathyroidism of renal origin: Secondary | ICD-10-CM | POA: Diagnosis present

## 2024-06-24 DIAGNOSIS — Z8673 Personal history of transient ischemic attack (TIA), and cerebral infarction without residual deficits: Secondary | ICD-10-CM

## 2024-06-24 DIAGNOSIS — I120 Hypertensive chronic kidney disease with stage 5 chronic kidney disease or end stage renal disease: Secondary | ICD-10-CM | POA: Diagnosis present

## 2024-06-24 DIAGNOSIS — Z7982 Long term (current) use of aspirin: Secondary | ICD-10-CM

## 2024-06-24 DIAGNOSIS — G9349 Other encephalopathy: Secondary | ICD-10-CM | POA: Diagnosis not present

## 2024-06-24 DIAGNOSIS — Z79899 Other long term (current) drug therapy: Secondary | ICD-10-CM

## 2024-06-24 DIAGNOSIS — I10 Essential (primary) hypertension: Secondary | ICD-10-CM | POA: Diagnosis present

## 2024-06-24 DIAGNOSIS — I252 Old myocardial infarction: Secondary | ICD-10-CM

## 2024-06-24 DIAGNOSIS — E871 Hypo-osmolality and hyponatremia: Secondary | ICD-10-CM | POA: Diagnosis present

## 2024-06-24 DIAGNOSIS — M25511 Pain in right shoulder: Secondary | ICD-10-CM | POA: Diagnosis present

## 2024-06-24 DIAGNOSIS — G9340 Encephalopathy, unspecified: Secondary | ICD-10-CM | POA: Diagnosis not present

## 2024-06-24 DIAGNOSIS — E119 Type 2 diabetes mellitus without complications: Secondary | ICD-10-CM

## 2024-06-24 DIAGNOSIS — E1122 Type 2 diabetes mellitus with diabetic chronic kidney disease: Secondary | ICD-10-CM | POA: Diagnosis present

## 2024-06-24 DIAGNOSIS — Z823 Family history of stroke: Secondary | ICD-10-CM

## 2024-06-24 DIAGNOSIS — E6609 Other obesity due to excess calories: Secondary | ICD-10-CM | POA: Diagnosis present

## 2024-06-24 DIAGNOSIS — Z880 Allergy status to penicillin: Secondary | ICD-10-CM

## 2024-06-24 DIAGNOSIS — R0609 Other forms of dyspnea: Secondary | ICD-10-CM | POA: Diagnosis not present

## 2024-06-24 DIAGNOSIS — E11649 Type 2 diabetes mellitus with hypoglycemia without coma: Secondary | ICD-10-CM | POA: Diagnosis present

## 2024-06-24 DIAGNOSIS — F17210 Nicotine dependence, cigarettes, uncomplicated: Secondary | ICD-10-CM | POA: Diagnosis present

## 2024-06-24 DIAGNOSIS — G9341 Metabolic encephalopathy: Secondary | ICD-10-CM | POA: Diagnosis present

## 2024-06-24 DIAGNOSIS — N186 End stage renal disease: Secondary | ICD-10-CM | POA: Diagnosis present

## 2024-06-24 DIAGNOSIS — Z8249 Family history of ischemic heart disease and other diseases of the circulatory system: Secondary | ICD-10-CM

## 2024-06-24 DIAGNOSIS — Y838 Other surgical procedures as the cause of abnormal reaction of the patient, or of later complication, without mention of misadventure at the time of the procedure: Secondary | ICD-10-CM | POA: Diagnosis present

## 2024-06-24 DIAGNOSIS — K7460 Unspecified cirrhosis of liver: Secondary | ICD-10-CM | POA: Diagnosis present

## 2024-06-24 DIAGNOSIS — E041 Nontoxic single thyroid nodule: Secondary | ICD-10-CM | POA: Diagnosis present

## 2024-06-24 DIAGNOSIS — Z825 Family history of asthma and other chronic lower respiratory diseases: Secondary | ICD-10-CM

## 2024-06-24 DIAGNOSIS — T80211A Bloodstream infection due to central venous catheter, initial encounter: Principal | ICD-10-CM | POA: Diagnosis present

## 2024-06-24 DIAGNOSIS — E8770 Fluid overload, unspecified: Secondary | ICD-10-CM | POA: Diagnosis present

## 2024-06-24 DIAGNOSIS — Z992 Dependence on renal dialysis: Secondary | ICD-10-CM

## 2024-06-24 DIAGNOSIS — N19 Unspecified kidney failure: Secondary | ICD-10-CM | POA: Diagnosis present

## 2024-06-24 DIAGNOSIS — Z91158 Patient's noncompliance with renal dialysis for other reason: Secondary | ICD-10-CM

## 2024-06-24 DIAGNOSIS — Z9049 Acquired absence of other specified parts of digestive tract: Secondary | ICD-10-CM

## 2024-06-24 DIAGNOSIS — E861 Hypovolemia: Secondary | ICD-10-CM | POA: Diagnosis present

## 2024-06-24 DIAGNOSIS — R4182 Altered mental status, unspecified: Principal | ICD-10-CM

## 2024-06-24 DIAGNOSIS — J811 Chronic pulmonary edema: Secondary | ICD-10-CM | POA: Diagnosis present

## 2024-06-24 DIAGNOSIS — Z7951 Long term (current) use of inhaled steroids: Secondary | ICD-10-CM

## 2024-06-24 DIAGNOSIS — Z888 Allergy status to other drugs, medicaments and biological substances status: Secondary | ICD-10-CM

## 2024-06-24 LAB — URINALYSIS, ROUTINE W REFLEX MICROSCOPIC
Bilirubin Urine: NEGATIVE
Glucose, UA: NEGATIVE mg/dL
Hgb urine dipstick: NEGATIVE
Ketones, ur: NEGATIVE mg/dL
Leukocytes,Ua: NEGATIVE
Nitrite: NEGATIVE
Protein, ur: >=300 mg/dL — AB
Specific Gravity, Urine: 1.014 (ref 1.005–1.030)
pH: 5 (ref 5.0–8.0)

## 2024-06-24 LAB — CBC WITH DIFFERENTIAL/PLATELET
Abs Immature Granulocytes: 0.02 K/uL (ref 0.00–0.07)
Basophils Absolute: 0.1 K/uL (ref 0.0–0.1)
Basophils Relative: 1 %
Eosinophils Absolute: 0.3 K/uL (ref 0.0–0.5)
Eosinophils Relative: 5 %
HCT: 36.7 % (ref 36.0–46.0)
Hemoglobin: 12.2 g/dL (ref 12.0–15.0)
Immature Granulocytes: 0 %
Lymphocytes Relative: 23 %
Lymphs Abs: 1.5 K/uL (ref 0.7–4.0)
MCH: 28.2 pg (ref 26.0–34.0)
MCHC: 33.2 g/dL (ref 30.0–36.0)
MCV: 84.8 fL (ref 80.0–100.0)
Monocytes Absolute: 1 K/uL (ref 0.1–1.0)
Monocytes Relative: 15 %
Neutro Abs: 3.6 K/uL (ref 1.7–7.7)
Neutrophils Relative %: 56 %
Platelets: 288 K/uL (ref 150–400)
RBC: 4.33 MIL/uL (ref 3.87–5.11)
RDW: 14.5 % (ref 11.5–15.5)
WBC: 6.5 K/uL (ref 4.0–10.5)
nRBC: 0 % (ref 0.0–0.2)

## 2024-06-24 LAB — COMPREHENSIVE METABOLIC PANEL WITH GFR
ALT: 14 U/L (ref 0–44)
AST: 32 U/L (ref 15–41)
Albumin: 2.6 g/dL — ABNORMAL LOW (ref 3.5–5.0)
Alkaline Phosphatase: 162 U/L — ABNORMAL HIGH (ref 38–126)
Anion gap: 23 — ABNORMAL HIGH (ref 5–15)
BUN: 67 mg/dL — ABNORMAL HIGH (ref 8–23)
CO2: 17 mmol/L — ABNORMAL LOW (ref 22–32)
Calcium: 8.8 mg/dL — ABNORMAL LOW (ref 8.9–10.3)
Chloride: 93 mmol/L — ABNORMAL LOW (ref 98–111)
Creatinine, Ser: 11.95 mg/dL — ABNORMAL HIGH (ref 0.44–1.00)
GFR, Estimated: 3 mL/min — ABNORMAL LOW (ref >60)
Glucose, Bld: 51 mg/dL — ABNORMAL LOW (ref 70–99)
Potassium: 4.1 mmol/L (ref 3.5–5.1)
Sodium: 133 mmol/L — ABNORMAL LOW (ref 135–145)
Total Bilirubin: 0.9 mg/dL (ref 0.0–1.2)
Total Protein: 7.9 g/dL (ref 6.5–8.1)

## 2024-06-24 LAB — CBC
HCT: 41 % (ref 36.0–46.0)
Hemoglobin: 13.9 g/dL (ref 12.0–15.0)
MCH: 28.3 pg (ref 26.0–34.0)
MCHC: 33.9 g/dL (ref 30.0–36.0)
MCV: 83.3 fL (ref 80.0–100.0)
Platelets: 297 K/uL (ref 150–400)
RBC: 4.92 MIL/uL (ref 3.87–5.11)
RDW: 14.3 % (ref 11.5–15.5)
WBC: 5.8 K/uL (ref 4.0–10.5)
nRBC: 0 % (ref 0.0–0.2)

## 2024-06-24 LAB — RAPID URINE DRUG SCREEN, HOSP PERFORMED
Amphetamines: NOT DETECTED
Barbiturates: NOT DETECTED
Benzodiazepines: NOT DETECTED
Cocaine: POSITIVE — AB
Opiates: NOT DETECTED
Tetrahydrocannabinol: NOT DETECTED

## 2024-06-24 LAB — CBG MONITORING, ED
Glucose-Capillary: 66 mg/dL — ABNORMAL LOW (ref 70–99)
Glucose-Capillary: 122 mg/dL — ABNORMAL HIGH (ref 70–99)
Glucose-Capillary: 80 mg/dL (ref 70–99)
Glucose-Capillary: 89 mg/dL (ref 70–99)
Glucose-Capillary: 99 mg/dL (ref 70–99)
Glucose-Capillary: 270 mg/dL — ABNORMAL HIGH (ref 70–99)

## 2024-06-24 LAB — RESP PANEL BY RT-PCR (RSV, FLU A&B, COVID)  RVPGX2
Influenza A by PCR: NEGATIVE
Influenza B by PCR: NEGATIVE
Resp Syncytial Virus by PCR: NEGATIVE
SARS Coronavirus 2 by RT PCR: NEGATIVE

## 2024-06-24 LAB — CREATININE, SERUM
Creatinine, Ser: 6.41 mg/dL — ABNORMAL HIGH (ref 0.44–1.00)
GFR, Estimated: 7 mL/min — ABNORMAL LOW (ref >60)

## 2024-06-24 LAB — HEMOGLOBIN A1C
Hgb A1c MFr Bld: 4.7 % — ABNORMAL LOW (ref 4.8–5.6)
Mean Plasma Glucose: 88.19 mg/dL

## 2024-06-24 LAB — ETHANOL: Alcohol, Ethyl (B): <15 mg/dL (ref <15)

## 2024-06-24 LAB — HEPATITIS B SURFACE ANTIGEN: Hepatitis B Surface Ag: NONREACTIVE

## 2024-06-24 LAB — TROPONIN I (HIGH SENSITIVITY)
Troponin I (High Sensitivity): 26 ng/L — ABNORMAL HIGH (ref <18)
Troponin I (High Sensitivity): 23 ng/L — ABNORMAL HIGH (ref <18)

## 2024-06-24 LAB — GLUCOSE, CAPILLARY: Glucose-Capillary: 86 mg/dL (ref 70–99)

## 2024-06-24 LAB — I-STAT CG4 LACTIC ACID, ED
Lactic Acid, Venous: 0.9 mmol/L (ref 0.5–1.9)
Lactic Acid, Venous: 0.8 mmol/L (ref 0.5–1.9)

## 2024-06-24 LAB — SALICYLATE LEVEL: Salicylate Lvl: <7 mg/dL — ABNORMAL LOW (ref 7.0–30.0)

## 2024-06-24 LAB — CK: Total CK: 249 U/L — ABNORMAL HIGH (ref 38–234)

## 2024-06-24 LAB — AMMONIA: Ammonia: 70 umol/L — ABNORMAL HIGH (ref 9–35)

## 2024-06-24 LAB — ACETAMINOPHEN LEVEL: Acetaminophen (Tylenol), Serum: 15 ug/mL (ref 10–30)

## 2024-06-24 MED ORDER — NAPHAZOLINE-PHENIRAMINE 0.025-0.3 % OP SOLN
2.0000 [drp] | Freq: Two times a day (BID) | OPHTHALMIC | Status: DC
  Administered 2024-06-25 – 2024-06-30 (×9): 2 [drp] via OPHTHALMIC
  Filled 2024-06-24 (×2): qty 15

## 2024-06-24 MED ORDER — ALBUTEROL SULFATE (2.5 MG/3ML) 0.083% IN NEBU: 3.0000 mL | INHALATION_SOLUTION | RESPIRATORY_TRACT | Status: DC | PRN

## 2024-06-24 MED ORDER — AMLODIPINE BESYLATE 10 MG PO TABS
10.0000 mg | ORAL_TABLET | Freq: Every day | ORAL | Status: DC
  Administered 2024-06-25 – 2024-06-30 (×6): 10 mg via ORAL
  Filled 2024-06-24 (×6): qty 1

## 2024-06-24 MED ORDER — HEPARIN SODIUM (PORCINE) 1000 UNIT/ML IJ SOLN
INTRAMUSCULAR | Status: AC
Start: 2024-06-24 — End: 2024-06-24
  Filled 2024-06-24: qty 4

## 2024-06-24 MED ORDER — SODIUM CHLORIDE 0.9% FLUSH: 10.0000 mL | INTRAVENOUS | Status: DC | PRN

## 2024-06-24 MED ORDER — ORAL CARE MOUTH RINSE: 15.0000 mL | OROMUCOSAL | Status: DC | PRN

## 2024-06-24 MED ORDER — DEXTROSE 10 % IV SOLN: INTRAVENOUS | Status: DC

## 2024-06-24 MED ORDER — SODIUM CHLORIDE 0.9 % IV BOLUS: 30.0000 mL/kg | Freq: Once | INTRAVENOUS | Status: DC

## 2024-06-24 MED ORDER — ASPIRIN 81 MG PO TBEC
81.0000 mg | DELAYED_RELEASE_TABLET | Freq: Every day | ORAL | Status: DC
  Administered 2024-06-25 – 2024-06-30 (×5): 81 mg via ORAL
  Filled 2024-06-24 (×5): qty 1

## 2024-06-24 MED ORDER — DEXTROSE 50 % IV SOLN
50.0000 mL | Freq: Once | INTRAVENOUS | Status: AC
  Administered 2024-06-24: 50 mL via INTRAVENOUS

## 2024-06-24 MED ORDER — PENTAFLUOROPROP-TETRAFLUOROETH EX AERO: 1.0000 | INHALATION_SPRAY | CUTANEOUS | Status: DC | PRN

## 2024-06-24 MED ORDER — SODIUM CHLORIDE 0.9% FLUSH
3.0000 mL | Freq: Two times a day (BID) | INTRAVENOUS | Status: DC
  Administered 2024-06-25 – 2024-06-30 (×10): 3 mL via INTRAVENOUS

## 2024-06-24 MED ORDER — SODIUM CHLORIDE 0.9% FLUSH
3.0000 mL | INTRAVENOUS | Status: DC | PRN
Start: 2024-06-24 — End: 2024-06-30

## 2024-06-24 MED ORDER — LIDOCAINE-PRILOCAINE 2.5-2.5 % EX CREA: 1.0000 | TOPICAL_CREAM | CUTANEOUS | Status: DC | PRN

## 2024-06-24 MED ORDER — IOHEXOL 350 MG/ML SOLN
75.0000 mL | Freq: Once | INTRAVENOUS | Status: AC | PRN
  Administered 2024-06-24: 75 mL via INTRAVENOUS

## 2024-06-24 MED ORDER — ONDANSETRON HCL 4 MG PO TABS
4.0000 mg | ORAL_TABLET | Freq: Four times a day (QID) | ORAL | Status: DC | PRN
  Administered 2024-06-28: 4 mg via ORAL
  Filled 2024-06-24: qty 1

## 2024-06-24 MED ORDER — PANTOPRAZOLE SODIUM 40 MG PO TBEC
40.0000 mg | DELAYED_RELEASE_TABLET | Freq: Every day | ORAL | Status: DC
  Administered 2024-06-25 – 2024-06-30 (×5): 40 mg via ORAL
  Filled 2024-06-24 (×5): qty 1

## 2024-06-24 MED ORDER — DEXTROSE 50 % IV SOLN
50.0000 mL | Freq: Once | INTRAVENOUS | Status: AC
  Administered 2024-06-24: 50 mL via INTRAVENOUS
  Filled 2024-06-24: qty 50

## 2024-06-24 MED ORDER — LACTULOSE 10 GM/15ML PO SOLN
20.0000 g | Freq: Once | ORAL | Status: AC
  Administered 2024-06-25: 20 g via ORAL
  Filled 2024-06-24: qty 30

## 2024-06-24 MED ORDER — CHLORHEXIDINE GLUCONATE CLOTH 2 % EX PADS
6.0000 | MEDICATED_PAD | Freq: Every day | CUTANEOUS | Status: DC
  Administered 2024-06-25 – 2024-06-30 (×5): 6 via TOPICAL

## 2024-06-24 MED ORDER — LIDOCAINE HCL (PF) 1 % IJ SOLN: 5.0000 mL | INTRAMUSCULAR | Status: DC | PRN

## 2024-06-24 MED ORDER — ZOLPIDEM TARTRATE 5 MG PO TABS
5.0000 mg | ORAL_TABLET | Freq: Every evening | ORAL | Status: DC | PRN
Start: 2024-06-25 — End: 2024-06-30
  Administered 2024-06-26 – 2024-06-29 (×4): 5 mg via ORAL
  Filled 2024-06-24 (×4): qty 1

## 2024-06-24 MED ORDER — OXYCODONE HCL 5 MG PO TABS: 5.0000 mg | ORAL_TABLET | Freq: Once | ORAL | Status: DC

## 2024-06-24 MED ORDER — ANTICOAGULANT SODIUM CITRATE 4% (200MG/5ML) IV SOLN: 5.0000 mL | Status: DC | PRN

## 2024-06-24 MED ORDER — SODIUM CHLORIDE 0.9% FLUSH
10.0000 mL | Freq: Two times a day (BID) | INTRAVENOUS | Status: DC
  Administered 2024-06-25 – 2024-06-30 (×7): 10 mL

## 2024-06-24 MED ORDER — SODIUM CHLORIDE 0.9 % IV SOLN: 250.0000 mL | INTRAVENOUS | Status: AC | PRN

## 2024-06-24 MED ORDER — HYDRALAZINE HCL 20 MG/ML IJ SOLN: 5.0000 mg | Freq: Four times a day (QID) | INTRAMUSCULAR | Status: DC | PRN

## 2024-06-24 MED ORDER — ACETAMINOPHEN 325 MG PO TABS
650.0000 mg | ORAL_TABLET | Freq: Four times a day (QID) | ORAL | Status: DC | PRN
  Administered 2024-06-25 – 2024-06-27 (×4): 650 mg via ORAL
  Filled 2024-06-24 (×4): qty 2

## 2024-06-24 MED ORDER — FLUTICASONE FUROATE-VILANTEROL 100-25 MCG/ACT IN AEPB
1.0000 | INHALATION_SPRAY | Freq: Every day | RESPIRATORY_TRACT | Status: DC
  Administered 2024-06-25 – 2024-06-30 (×5): 1 via RESPIRATORY_TRACT
  Filled 2024-06-24: qty 28

## 2024-06-24 MED ORDER — ATORVASTATIN CALCIUM 80 MG PO TABS
80.0000 mg | ORAL_TABLET | Freq: Every day | ORAL | Status: DC
  Administered 2024-06-25 – 2024-06-30 (×5): 80 mg via ORAL
  Filled 2024-06-24 (×5): qty 1

## 2024-06-24 MED ORDER — ALTEPLASE 2 MG IJ SOLR: 2.0000 mg | Freq: Once | INTRAMUSCULAR | Status: DC | PRN

## 2024-06-24 MED ORDER — DEXTROSE 50 % IV SOLN
INTRAVENOUS | Status: AC
Start: 2024-06-24 — End: 2024-06-24
  Filled 2024-06-24: qty 50

## 2024-06-24 MED ORDER — OXYCODONE-ACETAMINOPHEN 5-325 MG PO TABS
1.0000 | ORAL_TABLET | Freq: Three times a day (TID) | ORAL | Status: DC | PRN
  Administered 2024-06-25 – 2024-06-26 (×2): 1 via ORAL
  Filled 2024-06-24 (×2): qty 1

## 2024-06-24 MED ORDER — ONDANSETRON HCL 4 MG/2ML IJ SOLN
4.0000 mg | Freq: Four times a day (QID) | INTRAMUSCULAR | Status: DC | PRN
  Administered 2024-06-30: 4 mg via INTRAVENOUS
  Filled 2024-06-24: qty 2

## 2024-06-24 MED ORDER — POLYETHYLENE GLYCOL 3350 17 G PO PACK
17.0000 g | PACK | Freq: Two times a day (BID) | ORAL | Status: AC
  Administered 2024-06-25 (×3): 17 g via ORAL
  Filled 2024-06-24 (×3): qty 1

## 2024-06-24 MED ORDER — HEPARIN SODIUM (PORCINE) 5000 UNIT/ML IJ SOLN
5000.0000 [IU] | Freq: Three times a day (TID) | INTRAMUSCULAR | Status: DC
  Administered 2024-06-25 – 2024-06-30 (×15): 5000 [IU] via SUBCUTANEOUS
  Filled 2024-06-24 (×13): qty 1

## 2024-06-24 MED ORDER — ACETAMINOPHEN 650 MG RE SUPP: 650.0000 mg | Freq: Four times a day (QID) | RECTAL | Status: DC | PRN

## 2024-06-24 MED ORDER — HEPARIN SODIUM (PORCINE) 1000 UNIT/ML DIALYSIS
1000.0000 [IU] | INTRAMUSCULAR | Status: DC | PRN
Start: 2024-06-24 — End: 2024-06-26
  Administered 2024-06-26: 3800 [IU]

## 2024-06-24 NOTE — ED Notes (Signed)
 CCMD contacted to put patient on cardiac monitoring.

## 2024-06-24 NOTE — H&P (Signed)
 History and Physical  Carol [name] FMW:[id number] DOB: [birth year] DOA: 06/24/2024  PCP: [facility] Patient coming from: Home  I have personally briefly reviewed patient's old medical records in [facility]   Chief Complaint: Confusion  HPI: Carol [name] is a 63 y.o. 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  He relates she has not had dialysis since [id number] due to pain around her tunneled catheter. Started developing body aches and confusion the day of admission.  He relates she had a subjective fever with decreased oral intake.  In the ED: She was found to be afebrile and hypertensive at 180/100 afebrile with a white count of 6.5, CT of the head showed no acute findings old infarcts, chest x-ray showed cardiomegaly with very mild pulmonary edema.  CT scan of the abdomen and pelvis is pending.   Review of Systems: All systems reviewed and apart from history of presenting illness, are negative.  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   Chronic back pain    Depression    Diabetes mellitus    ESRD (end stage renal disease) on dialysis [facility])    M,W,F   GERD (gastroesophageal reflux disease)    Heart murmur    Hypertension    NSTEMI (non-ST elevated myocardial infarction) (HCC) 12/20/2021   Palpitations    Parkinson's disease (HCC)    Past Surgical History:  Procedure Laterality Date   AV FISTULA PLACEMENT Left 08/01/2023   Procedure: LEFT ARM  BRACHIOBASILIC FISTULA CREATION;  Surgeon: Pearline Norman RAMAN, MD;  Location: [facility] OR;  Service: Vascular;  Laterality: Left;   BALLOON DILATION N/A 11/06/2022   Procedure: BALLOON DILATION;  Surgeon: Shila Gustav GAILS, MD;  Location: [facility] ENDOSCOPY;  Service: Gastroenterology;  Laterality: N/A;   CARPAL TUNNEL RELEASE     CHOLECYSTECTOMY N/A 01/10/2024   Procedure: LAPAROSCOPIC CHOLECYSTECTOMY, ATTEMPTED;  Surgeon: Polly Cordella LABOR, MD;  Location: [facility] OR;  Service: General;  Laterality: N/A;   CHOLECYSTECTOMY N/A 01/10/2024   Procedure: OPEN CHOLECYSTECTOMY;  Surgeon: Polly Cordella LABOR, MD;  Location: [facility] OR;  Service: General;  Laterality: N/A;   COLONOSCOPY WITH PROPOFOL  N/A 11/06/2022   Procedure: COLONOSCOPY WITH PROPOFOL ;  Surgeon: Shila Gustav GAILS, MD;  Location: [facility] ENDOSCOPY;  Service: Gastroenterology;  Laterality: N/A;   ESOPHAGOGASTRODUODENOSCOPY (EGD) WITH PROPOFOL  N/A 11/06/2022   Procedure: ESOPHAGOGASTRODUODENOSCOPY (EGD) WITH PROPOFOL ;  Surgeon: Shila Gustav GAILS, MD;  Location: [facility] ENDOSCOPY;  Service: Gastroenterology;  Laterality: N/A;   INDOCYANINE GREEN  FLUORESCENCE IMAGING (ICG) N/A 01/10/2024   Procedure: INDOCYANINE GREEN  FLUORESCENCE IMAGING (ICG);  Surgeon: Polly Cordella LABOR, MD;  Location: [facility] OR;  Service: General;  Laterality: N/A;   INSERTION OF DIALYSIS CATHETER Right 08/01/2023   Procedure: INSERTION OF TUNNELED DIALYSIS CATHETER;  Surgeon: Pearline Norman RAMAN, MD;  Location: [facility] OR;  Service: Vascular;  Laterality: Right;   IR FLUORO GUIDE CV LINE RIGHT  01/26/2022   IR FLUORO GUIDE CV LINE RIGHT  01/08/2024   IR REMOVAL TUN CV CATH W/O FL  01/06/2024   IR US   GUIDE VASC ACCESS RIGHT  01/26/2022   IR US  GUIDE VASC ACCESS RIGHT  01/08/2024   LEFT HEART CATH AND CORONARY ANGIOGRAPHY N/A 12/20/2021   Procedure: LEFT HEART CATH AND CORONARY ANGIOGRAPHY;  Surgeon: Verlin Lonni BIRCH, MD;  Location: [facility] INVASIVE CV LAB;  Service: Cardiovascular;  Laterality: N/A;   OPEN  REDUCTION INTERNAL FIXATION (ORIF) FINGER WITH RADIAL BONE GRAFT Right 09/12/2015   Procedure: RIGHT MIDDLE FINGER CLOSED REDUCTION AND PINNING;  Surgeon: Prentice Pagan, MD;  Location: [facility] OR;  Service: Orthopedics;  Laterality: Right;   TONSILLECTOMY     TRANSESOPHAGEAL ECHOCARDIOGRAM (CATH LAB) N/A 01/09/2024   Procedure: TRANSESOPHAGEAL ECHOCARDIOGRAM;  Surgeon: Mona Vinie BROCKS, MD;  Location: [facility] INVASIVE CV LAB;  Service: Cardiovascular;  Laterality: N/A;   TUBAL LIGATION     Social History:  reports that she has been smoking cigarettes. She has a 0.8 pack-year smoking history. She has been exposed to tobacco smoke. She has never used smokeless tobacco. She reports current alcohol use. She reports current drug use. Drug: Cocaine .   Allergies  Allergen Reactions   Penicillins Shortness Of Breath and Other (See Comments)    Caused yeast infection Has patient had a PCN reaction causing immediate rash, facial/tongue/throat swelling, SOB or lightheadedness with hypotension: Yes Has patient had a PCN reaction causing severe rash involving mucus membranes or skin necrosis: No Has patient had a PCN reaction that required hospitalization pt was in the hospital at the time of the reaction Has patient had a PCN reaction occurring within the last 10 years: No If all of the above answers are NO, then may proceed with Cephalosp   Ultram  [Tramadol ] Other (See Comments)    Made her tongue raw    Family History  Problem Relation Age of Onset   Diabetes Mother    Kidney disease Mother    Seizures Father    Hypertension Father    Stroke Father    Asthma Other     Prior to Admission medications   Medication Sig Start Date End Date Taking? Authorizing Provider  albuterol  (PROVENTIL  HFA;VENTOLIN  HFA) 108 (90 Base) MCG/ACT inhaler Inhale 2 puffs into the lungs every 4 (four) hours as needed for wheezing or shortness of breath.    [provider]  albuterol  (VENTOLIN  HFA) 108 (90 Base)  MCG/ACT inhaler Inhale 1-2 puffs into the lungs every 6 (six) hours as needed for wheezing or shortness of breath. 08/25/23   Elnor Jayson LABOR, DO  amLODipine  (NORVASC ) 10 MG tablet Take 1 tablet (10 mg total) by mouth daily. 11/01/22   Glena Harlene HERO, FNP  aspirin  EC 81 MG tablet Take 1 tablet (81 mg total) by mouth daily. Swallow whole. 05/11/22   Lemon Harlene, MD  atorvastatin  (LIPITOR ) 80 MG tablet Take 1 tablet (80 mg total) by mouth daily. 03/11/23   Lue Elsie BROCKS, MD  budesonide -formoterol  [facility]) 160-4.5 MCG/ACT inhaler Inhale 2 puffs into the lungs in the morning and at bedtime. 11/09/21   [provider]  cetirizine (ZYRTEC) 10 MG tablet Take 10 mg by mouth daily as needed for allergies.    [provider]  Darbepoetin Alfa  (ARANESP ) 60 MCG/0.3ML SOSY injection Inject 0.3 mLs (60 mcg total) into the skin every Thursday at 6pm. 08/08/23   Dezii, Alexandra, DO  eszopiclone 3 MG TABS Take 3 mg by mouth at bedtime. Take immediately before bedtime    [provider]  fluconazole  (DIFLUCAN ) 200 MG tablet Take 1 tablet (200 mg total) by mouth at  bedtime. 01/16/24   Arlon Carliss ORN, DO  gabapentin  (NEURONTIN ) 100 MG capsule Take 1 capsule (100 mg total) by mouth 2 (two) times daily. 08/14/23 01/03/24  Dezii, Alexandra, DO  glucagon  1 MG injection Follow package directions for low blood sugar. 08/14/23 08/13/24  Dezii, Alexandra, DO  guaiFENesin -dextromethorphan (ROBITUSSIN DM) 100-10 MG/5ML syrup Take 5 mLs by mouth every 4 (four) hours as needed for cough. 08/25/23   Elnor Jayson LABOR, DO  lidocaine  (LIDODERM ) 5 % Place 1 patch onto the skin daily. Remove & Discard patch within 12 hours or as directed by MD 12/10/23   Garrick Charleston, MD  naloxone  (NARCAN ) nasal spray 4 mg/0.1 mL If concern for opioid overdose, spray in nostril. Call 911. 08/14/23   Dezii, Alexandra, DO  naphazoline-pheniramine (NAPHCON-A) 0.025-0.3 % ophthalmic solution Place 2 drops into both eyes 2  (two) times daily.    [provider]  nitroGLYCERIN  (NITROSTAT ) 0.4 MG SL tablet Place 1 tablet (0.4 mg total) under the tongue every 5 (five) minutes as needed for chest pain. 05/11/22 01/03/24  Lemon Raisin, MD  oxyCODONE -acetaminophen  (PERCOCET/ROXICET) 5-325 MG tablet Take 1 tablet by mouth every 8 (eight) hours as needed for severe pain (pain score 7-10). 12/10/23   Garrick Charleston, MD  pantoprazole  (PROTONIX ) 40 MG tablet Take 1 tablet (40 mg total) by mouth daily at 12 noon. Patient taking differently: Take 40 mg by mouth at bedtime. 05/11/22   Lemon Raisin, MD  promethazine -dextromethorphan (PROMETHAZINE -DM) 6.25-15 MG/5ML syrup Take 5 mLs by mouth 4 (four) times daily as needed for cough. 11/07/23   Patt Alm Macho, MD  triamcinolone  ointment (KENALOG ) 0.1 % 1 Application 2 (two) times daily as needed. 06/29/23   [provider]   Physical Exam: Vitals:   06/24/24 1103 06/24/24 1107 06/24/24 1200 06/24/24 1500  BP:  (!) 181/77 (!) 198/86 (!) 184/86  Pulse:  69 73 70  Resp:  19 (!) 24 17  Temp:  97.8 F (36.6 C)    TempSrc:  Oral    SpO2:  99% 100% 98%  Weight: 60 kg     Height: 5' 6 (1.676 m)       General exam: Moderately built and nourished patient, lying comfortably supine. Head, eyes and ENT: Nontraumatic and normocephalic. . Neck: Supple. No JVD, carotid bruit or thyromegaly. Lymphatics: No lymphadenopathy. Respiratory system: Air movement and crackles at bases predominantly on the right Cardiovascular system: S1 and S2 heard, RRR.  Positive JVD Gastrointestinal system: Positive bowel sounds soft nontender nondistended Central nervous system: Alert and oriented. No focal neurological deficits. Extremities: Symmetric 5 x 5 power. Peripheral pulses symmetrically felt.  Skin: Tender around the tunneled catheter area no erythematous no drainage.  The catheter dressing actually looks kind of awful disheveled and dirty Musculoskeletal system: Negative  exam. Psychiatry: Pleasant and cooperative.   Labs on Admission:  Basic Metabolic Panel: Recent Labs  Lab 06/24/24 1253  NA 133*  K 4.1  CL 93*  CO2 17*  GLUCOSE 51*  BUN 67*  CREATININE 11.95*  CALCIUM  8.8*   Liver Function Tests: Recent Labs  Lab 06/24/24 1253  AST 32  ALT 14  ALKPHOS 162*  BILITOT 0.9  PROT 7.9  ALBUMIN  2.6*   No results for input(s): LIPASE, AMYLASE in the last 168 hours. No results for input(s): AMMONIA in the last 168 hours. CBC: Recent Labs  Lab 06/24/24 1253  WBC 6.5  NEUTROABS 3.6  HGB 12.2  HCT 36.7  MCV 84.8  PLT 288  Cardiac Enzymes: No results for input(s): CKTOTAL, CKMB, CKMBINDEX, TROPONINI in the last 168 hours.  BNP (last 3 results) No results for input(s): PROBNP in the last 8760 hours. CBG: Recent Labs  Lab 06/24/24 1422 06/24/24 1515  GLUCAP 66* 122*    Radiological Exams on Admission: DG Chest Port 1 View Result Date: 06/24/2024 CLINICAL DATA:  Cough. EXAM: PORTABLE CHEST 1 VIEW COMPARISON:  Chest radiograph dated 06/07/2026. FINDINGS: Right-sided dialysis catheter in similar position. No focal consolidation, pleural effusion or pneumothorax. There is cardiomegaly with vascular congestion. No acute osseous pathology. IMPRESSION: Cardiomegaly with vascular congestion. No focal consolidation. Electronically Signed   By: Vanetta Chou M.D.   On: 06/24/2024 14:57   CT Head Wo Contrast Result Date: 06/24/2024 CLINICAL DATA:  Mental status change. EXAM: CT HEAD WITHOUT CONTRAST TECHNIQUE: Contiguous axial images were obtained from the base of the skull through the vertex without intravenous contrast. RADIATION DOSE REDUCTION: This exam was performed according to the departmental dose-optimization program which includes automated exposure control, adjustment of the mA and/or kV according to patient size and/or use of iterative reconstruction technique. COMPARISON:  06/07/2024 FINDINGS: Brain: Ventricles,  cisterns and other CSF spaces are normal. Possible small old lacunar infarct over the anterior limb left internal capsule unchanged. 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 No mass, mass effect, shift of midline structures or acute hemorrhage. No evidence of acute infarction. Vascular: No hyperdense vessel or unexpected calcification. Skull: Normal. Negative for fracture or focal lesion. Sinuses/Orbits: Visualized orbits are normal. Paranasal sinuses are clear. There is hypoplastic frontal sinuses. Other: None. IMPRESSION: 1. No acute findings. 2. 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 Possible small old lacunar infarct over the anterior limb left internal capsule unchanged. Electronically Signed   By: Toribio Agreste M.D.   On: 06/24/2024 14:09    EKG: Independently reviewed.  Normal sinus rhythm normal axis no T wave abnormalities.  Assessment/Plan Uremic encephalopathy: She has been noncompliant with her dialysis treatment since 06/17/2024.  She has mild pulmonary edema positive JVD and crackles at bases. Nephrology has been consulted will need HD. Agree with getting blood cultures as she is complaining of tenderness around her tunneled catheter though there is no erythema or purulent discharge. Will hold antibiotics for now she has remained afebrile here with no leukocytosis. Hold gabapentin , discontinue D5. Place her n.p.o. for now. Respiratory panel is negative alcohol and salicylate levels are undetectable. CT scan of the abdomen and pelvis showed left thyroid  nodule, old rib fractures  OOB, PT consult.  End stage renal disease [facility]) Nephrology has been consulted to dialyze today.  DM2 (diabetes mellitus, type 2) (HCC) She is no longer a diabetic last hemoglobin A1c 5 months ago was 4.1.  Essential hypertension: Restart her home Norvasc .  Hypervolemic hyponatremia: She is will start dialysis today.  Left thyroid  nodule: Will need further follow-up with PCP  without ultrasound.  Class 2 obesity due to excess calories with body mass index (BMI) of 37.0 to 37.9 in adult Noted.     DVT Prophylaxis: lovenox  Code Status: Full  Family Communication: Fianc Disposition Plan: Inpatient     It is my clinical opinion that admission to INPATIENT is reasonable and necessary in this 63 y.o. female presenting with symptoms of uremic encephalopathy  Given the aforementioned, the predictability of an adverse outcome is felt to be significant. I expect that the patient will require at least 2 midnights in the hospital to treat this condition.  Erle Odell Castor MD [facility]  06/24/2024, 4:03 PM

## 2024-06-24 NOTE — ED Notes (Signed)
 Patient transported to X-ray

## 2024-06-24 NOTE — ED Notes (Signed)
 Upon discussing plan of care with patient and family member at bedside, patient and family member refusing in/out cath despite receiving education on importance of urine collection. This RN also explained to pt and family that there is an order for two sets of blood cultures to be collected and explained why blood cultures are needed. Pt family refusing for this RN to obtain blood cultures, stating he would like a phlebotomist to collect. Phlebotomist to attempt when available. EDP notified of delay in care.

## 2024-06-24 NOTE — Plan of Care (Signed)
   Problem: Education: Goal: Knowledge of General Education information will improve Description Including pain rating scale, medication(s)/side effects and non-pharmacologic comfort measures Outcome: Progressing

## 2024-06-24 NOTE — Progress Notes (Signed)
 New Admission Note:  Arrival Method: Stretcher Mental Orientation: Alert and oriented x 2 -3.  Telemetry: N/A Assessment: Completed Skin: Warm and dry IV: NSL Pain: Denies Tubes: N/A Safety Measures: Safety Fall Prevention Plan initiated.  Admission: Completed 5 M  Orientation: Patient has been orientated to the room, unit and the staff. Welcome booklet given.  Family: At bedside  Orders have been reviewed and implemented. Will continue to monitor the patient. Call light has been placed within reach and bed alarm has been activated.   Durwood Dee BSN, RN  Phone Number: [phone]

## 2024-06-24 NOTE — Consult Note (Addendum)
 [facility] Renal Consultation Note    Indication for Consultation:  Management of ESRD/hemodialysis, anemia, hypertension/volume, and secondary hyperparathyroidism. PCP:  HPI: Carol [name] is a 63 y.o. female with ESRD on HD MWF, HTN, T2DM, Parkinson's disease, cirrhosis, prior substance abuse disorder who is being admitted with AMS.  Presented to ED this AM with confusion. Hx per her fiance. Had missed HD on Fri 10/31 due to pain around her [facility], then developed generalized body aches and confusion over the following few days which prompted evaluation. In the ED, she was hypertensive but non-hypoxic and afebrile. Labs with Na 133, K 4.1, CO2 17, BUN 67, Ca 8.8, Alb 2.6, WBC 6.5, Hgb 12.2, Plts 288. COVID/Flu negative. EtOH negative. CXR with vascular congestion noted. Head CT with prior CVA noted, nothing acute. Ammonia level pending. CT chest/abd pending as well. Blood Cx drawn as well - pending.  Seen in ED bed, fiance at bedside. She is not alert, restless in bed and not answering questions.   Dialyzes on MWF schedule at [facility]. Last HD was 10/29 - she missed HD on Fri 10/31 and Mon 11/3. She has a R TDC as well as a LUE AVF which is well developed but deep and will need superficialization prior to use.  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   Chronic back pain    Depression    Diabetes mellitus    ESRD (end stage renal disease) on dialysis [facility])    M,W,F   GERD (gastroesophageal reflux disease)    Heart murmur    Hypertension    NSTEMI (non-ST elevated myocardial infarction) (HCC) 12/20/2021   Palpitations    Parkinson's disease (HCC)    Past Surgical History:  Procedure Laterality Date   AV FISTULA PLACEMENT Left 08/01/2023   Procedure: LEFT ARM BRACHIOBASILIC FISTULA CREATION;  Surgeon: Pearline Norman RAMAN, MD;  Location: [facility] OR;  Service:  Vascular;  Laterality: Left;   BALLOON DILATION N/A 11/06/2022   Procedure: BALLOON DILATION;  Surgeon: Shila Gustav GAILS, MD;  Location: [facility] ENDOSCOPY;  Service: Gastroenterology;  Laterality: N/A;   CARPAL TUNNEL RELEASE     CHOLECYSTECTOMY N/A 01/10/2024   Procedure: LAPAROSCOPIC CHOLECYSTECTOMY, ATTEMPTED;  Surgeon: Polly Cordella LABOR, MD;  Location: [facility] OR;  Service: General;  Laterality: N/A;   CHOLECYSTECTOMY N/A 01/10/2024   Procedure: OPEN CHOLECYSTECTOMY;  Surgeon: Polly Cordella LABOR, MD;  Location: [facility] OR;  Service: General;  Laterality: N/A;   COLONOSCOPY WITH PROPOFOL  N/A 11/06/2022   Procedure: COLONOSCOPY WITH PROPOFOL ;  Surgeon: Shila Gustav GAILS, MD;  Location: [facility] ENDOSCOPY;  Service: Gastroenterology;  Laterality: N/A;   ESOPHAGOGASTRODUODENOSCOPY (EGD) WITH PROPOFOL  N/A 11/06/2022   Procedure: ESOPHAGOGASTRODUODENOSCOPY (EGD) WITH PROPOFOL ;  Surgeon: Shila Gustav GAILS, MD;  Location: [facility] ENDOSCOPY;  Service: Gastroenterology;  Laterality: N/A;   INDOCYANINE GREEN  FLUORESCENCE IMAGING (ICG) N/A 01/10/2024   Procedure: INDOCYANINE GREEN  FLUORESCENCE IMAGING (ICG);  Surgeon: Polly Cordella LABOR, MD;  Location: [facility] OR;  Service: General;  Laterality: N/A;   INSERTION OF DIALYSIS CATHETER Right 08/01/2023   Procedure: INSERTION OF TUNNELED DIALYSIS CATHETER;  Surgeon: Pearline Norman RAMAN, MD;  Location: [facility] OR;  Service: Vascular;  Laterality: Right;   IR FLUORO GUIDE CV LINE RIGHT  01/26/2022   IR FLUORO GUIDE CV LINE RIGHT  01/08/2024   IR REMOVAL TUN CV CATH W/O FL  01/06/2024   IR US  GUIDE VASC  ACCESS RIGHT  01/26/2022   IR US  GUIDE VASC ACCESS RIGHT  01/08/2024   LEFT HEART CATH AND CORONARY ANGIOGRAPHY N/A 12/20/2021   Procedure: LEFT HEART CATH AND CORONARY ANGIOGRAPHY;  Surgeon: Verlin Lonni BIRCH, MD;  Location: [facility] INVASIVE CV LAB;  Service: Cardiovascular;  Laterality: N/A;   OPEN REDUCTION INTERNAL FIXATION (ORIF) FINGER WITH RADIAL BONE GRAFT Right 09/12/2015   Procedure:  RIGHT MIDDLE FINGER CLOSED REDUCTION AND PINNING;  Surgeon: Prentice Pagan, MD;  Location: [facility] OR;  Service: Orthopedics;  Laterality: Right;   TONSILLECTOMY     TRANSESOPHAGEAL ECHOCARDIOGRAM (CATH LAB) N/A 01/09/2024   Procedure: TRANSESOPHAGEAL ECHOCARDIOGRAM;  Surgeon: Mona Vinie BROCKS, MD;  Location: [facility] INVASIVE CV LAB;  Service: Cardiovascular;  Laterality: N/A;   TUBAL LIGATION     Family History  Problem Relation Age of Onset   Diabetes Mother    Kidney disease Mother    Seizures Father    Hypertension Father    Stroke Father    Asthma Other    Social History:  reports that she has been smoking cigarettes. She has a 0.8 pack-year smoking history. She has been exposed to tobacco smoke. She has never used smokeless tobacco. She reports current alcohol use. She reports current drug use. Drug: Cocaine .  ROS: As per HPI otherwise negative.  Physical Exam: Vitals:   06/24/24 1103 06/24/24 1107 06/24/24 1200 06/24/24 1500  BP:  (!) 181/77 (!) 198/86 (!) 184/86  Pulse:  69 73 70  Resp:  19 (!) 24 17  Temp:  97.8 F (36.6 C)    TempSrc:  Oral    SpO2:  99% 100% 98%  Weight: 60 kg     Height: 5' 6 (1.676 m)        General: Encephalopathic, restless woman. Room air. Head: Normocephalic, atraumatic Neck: Supple without lymphadenopathy/masses. Lungs: Clear bilaterally to auscultation without wheezes, rales, or rhonchi. Breathing is unlabored. Heart: RRR, 3/6 murmur noted Abdomen: Soft, non-tender, non-distended with normoactive bowel sounds.  Musculoskeletal:  Strength and tone appear normal for age. Lower extremities: No LE edema Neuro: Not responding verbally, myoclonic jerking noted but not overt asterixis Dialysis Access: TDC in R chest - no exit site drainage. LUE AVF +t/b but deep (1st stage BVT)  Allergies  Allergen Reactions   Penicillins Shortness Of Breath and Other (See Comments)    Caused yeast infection Has patient had a PCN reaction causing immediate rash,  facial/tongue/throat swelling, SOB or lightheadedness with hypotension: Yes Has patient had a PCN reaction causing severe rash involving mucus membranes or skin necrosis: No Has patient had a PCN reaction that required hospitalization pt was in the hospital at the time of the reaction Has patient had a PCN reaction occurring within the last 10 years: No If all of the above answers are NO, then may proceed with Cephalosp   Ultram  [Tramadol ] Other (See Comments)    Made her tongue raw   Prior to Admission medications   Medication Sig Start Date End Date Taking? Authorizing Provider  albuterol  (PROVENTIL  HFA;VENTOLIN  HFA) 108 (90 Base) MCG/ACT inhaler Inhale 2 puffs into the lungs every 4 (four) hours as needed for wheezing or shortness of breath.    [provider]  albuterol  (VENTOLIN  HFA) 108 (90 Base) MCG/ACT inhaler Inhale 1-2 puffs into the lungs every 6 (six) hours as needed for wheezing or shortness of breath. 08/25/23   Elnor Jayson LABOR, DO  amLODipine  (NORVASC ) 10 MG tablet Take 1 tablet (10 mg total) by mouth  daily. 11/01/22   Glena Harlene HERO, FNP  aspirin  EC 81 MG tablet Take 1 tablet (81 mg total) by mouth daily. Swallow whole. 05/11/22   Lemon Harlene, MD  atorvastatin  (LIPITOR ) 80 MG tablet Take 1 tablet (80 mg total) by mouth daily. 03/11/23   Lue Elsie BROCKS, MD  budesonide -formoterol  [facility]) 160-4.5 MCG/ACT inhaler Inhale 2 puffs into the lungs in the morning and at bedtime. 11/09/21   [provider]  cetirizine (ZYRTEC) 10 MG tablet Take 10 mg by mouth daily as needed for allergies.    [provider]  Darbepoetin Alfa  (ARANESP ) 60 MCG/0.3ML SOSY injection Inject 0.3 mLs (60 mcg total) into the skin every Thursday at 6pm. 08/08/23   Dezii, Alexandra, DO  eszopiclone 3 MG TABS Take 3 mg by mouth at bedtime. Take immediately before bedtime    [provider]  fluconazole  (DIFLUCAN ) 200 MG tablet Take 1 tablet (200 mg total) by mouth at  bedtime. 01/16/24   Arlon Carliss ORN, DO  gabapentin  (NEURONTIN ) 100 MG capsule Take 1 capsule (100 mg total) by mouth 2 (two) times daily. 08/14/23 01/03/24  Dezii, Alexandra, DO  glucagon  1 MG injection Follow package directions for low blood sugar. 08/14/23 08/13/24  Dezii, Alexandra, DO  guaiFENesin -dextromethorphan (ROBITUSSIN DM) 100-10 MG/5ML syrup Take 5 mLs by mouth every 4 (four) hours as needed for cough. 08/25/23   Elnor Jayson LABOR, DO  lidocaine  (LIDODERM ) 5 % Place 1 patch onto the skin daily. Remove & Discard patch within 12 hours or as directed by MD 12/10/23   Garrick Charleston, MD  naloxone  (NARCAN ) nasal spray 4 mg/0.1 mL If concern for opioid overdose, spray in nostril. Call 911. 08/14/23   Dezii, Alexandra, DO  naphazoline-pheniramine (NAPHCON-A) 0.025-0.3 % ophthalmic solution Place 2 drops into both eyes 2 (two) times daily.    [provider]  nitroGLYCERIN  (NITROSTAT ) 0.4 MG SL tablet Place 1 tablet (0.4 mg total) under the tongue every 5 (five) minutes as needed for chest pain. 05/11/22 01/03/24  Lemon Harlene, MD  oxyCODONE -acetaminophen  (PERCOCET/ROXICET) 5-325 MG tablet Take 1 tablet by mouth every 8 (eight) hours as needed for severe pain (pain score 7-10). 12/10/23   Garrick Charleston, MD  pantoprazole  (PROTONIX ) 40 MG tablet Take 1 tablet (40 mg total) by mouth daily at 12 noon. Patient taking differently: Take 40 mg by mouth at bedtime. 05/11/22   Lemon Harlene, MD  promethazine -dextromethorphan (PROMETHAZINE -DM) 6.25-15 MG/5ML syrup Take 5 mLs by mouth 4 (four) times daily as needed for cough. 11/07/23   Patt Alm Macho, MD  triamcinolone  ointment (KENALOG ) 0.1 % 1 Application 2 (two) times daily as needed. 06/29/23   [provider]   Current Facility-Administered Medications  Medication Dose Route Frequency Provider Last Rate Last Admin   dextrose  10 % infusion   Intravenous Continuous Donnajean Lynwood DEL, PA-C       Current Outpatient Medications   Medication Sig Dispense Refill   albuterol  (PROVENTIL  HFA;VENTOLIN  HFA) 108 (90 Base) MCG/ACT inhaler Inhale 2 puffs into the lungs every 4 (four) hours as needed for wheezing or shortness of breath.     albuterol  (VENTOLIN  HFA) 108 (90 Base) MCG/ACT inhaler Inhale 1-2 puffs into the lungs every 6 (six) hours as needed for wheezing or shortness of breath. 1 each 0   amLODipine  (NORVASC ) 10 MG tablet Take 1 tablet (10 mg total) by mouth daily. 30 tablet 3   aspirin  EC 81 MG tablet Take 1 tablet (81 mg total) by mouth daily. Swallow  whole. 30 tablet 0   atorvastatin  (LIPITOR ) 80 MG tablet Take 1 tablet (80 mg total) by mouth daily. 30 tablet 0   budesonide -formoterol  (SYMBICORT) 160-4.5 MCG/ACT inhaler Inhale 2 puffs into the lungs in the morning and at bedtime.     cetirizine (ZYRTEC) 10 MG tablet Take 10 mg by mouth daily as needed for allergies.     Darbepoetin Alfa  (ARANESP ) 60 MCG/0.3ML SOSY injection Inject 0.3 mLs (60 mcg total) into the skin every Thursday at 6pm.     eszopiclone 3 MG TABS Take 3 mg by mouth at bedtime. Take immediately before bedtime     fluconazole  (DIFLUCAN ) 200 MG tablet Take 1 tablet (200 mg total) by mouth at bedtime. 3 tablet 0   gabapentin  (NEURONTIN ) 100 MG capsule Take 1 capsule (100 mg total) by mouth 2 (two) times daily. 60 capsule 0   glucagon  1 MG injection Follow package directions for low blood sugar. 1 each 1   guaiFENesin -dextromethorphan (ROBITUSSIN DM) 100-10 MG/5ML syrup Take 5 mLs by mouth every 4 (four) hours as needed for cough. 118 mL 0   lidocaine  (LIDODERM ) 5 % Place 1 patch onto the skin daily. Remove & Discard patch within 12 hours or as directed by MD 30 patch 0   naloxone  (NARCAN ) nasal spray 4 mg/0.1 mL If concern for opioid overdose, spray in nostril. Call 911. 1 each 0   naphazoline-pheniramine (NAPHCON-A) 0.025-0.3 % ophthalmic solution Place 2 drops into both eyes 2 (two) times daily.     nitroGLYCERIN  (NITROSTAT ) 0.4 MG SL tablet Place 1  tablet (0.4 mg total) under the tongue every 5 (five) minutes as needed for chest pain. 30 tablet 0   oxyCODONE -acetaminophen  (PERCOCET/ROXICET) 5-325 MG tablet Take 1 tablet by mouth every 8 (eight) hours as needed for severe pain (pain score 7-10). 10 tablet 0   pantoprazole  (PROTONIX ) 40 MG tablet Take 1 tablet (40 mg total) by mouth daily at 12 noon. (Patient taking differently: Take 40 mg by mouth at bedtime.) 30 tablet 0   promethazine -dextromethorphan (PROMETHAZINE -DM) 6.25-15 MG/5ML syrup Take 5 mLs by mouth 4 (four) times daily as needed for cough. 50 mL 0   triamcinolone  ointment (KENALOG ) 0.1 % 1 Application 2 (two) times daily as needed.     Labs: Basic Metabolic Panel: Recent Labs  Lab 06/24/24 1253  NA 133*  K 4.1  CL 93*  CO2 17*  GLUCOSE 51*  BUN 67*  CREATININE 11.95*  CALCIUM  8.8*   Liver Function Tests: Recent Labs  Lab 06/24/24 1253  AST 32  ALT 14  ALKPHOS 162*  BILITOT 0.9  PROT 7.9  ALBUMIN  2.6*   CBC: Recent Labs  Lab 06/24/24 1253  WBC 6.5  NEUTROABS 3.6  HGB 12.2  HCT 36.7  MCV 84.8  PLT 288   CBG: Recent Labs  Lab 06/24/24 1422 06/24/24 1515  GLUCAP 66* 122*   Studies/Results: DG Chest Port 1 View Result Date: 06/24/2024 CLINICAL DATA:  Cough. EXAM: PORTABLE CHEST 1 VIEW COMPARISON:  Chest radiograph dated 06/07/2026. FINDINGS: Right-sided dialysis catheter in similar position. No focal consolidation, pleural effusion or pneumothorax. There is cardiomegaly with vascular congestion. No acute osseous pathology. IMPRESSION: Cardiomegaly with vascular congestion. No focal consolidation. Electronically Signed   By: Vanetta Chou M.D.   On: 06/24/2024 14:57   CT Head Wo Contrast Result Date: 06/24/2024 CLINICAL DATA:  Mental status change. EXAM: CT HEAD WITHOUT CONTRAST TECHNIQUE: Contiguous axial images were obtained from the base of the skull through the  vertex without intravenous contrast. RADIATION DOSE REDUCTION: This exam was  performed according to the departmental dose-optimization program which includes automated exposure control, adjustment of the mA and/or kV according to patient size and/or use of iterative reconstruction technique. COMPARISON:  06/07/2024 FINDINGS: Brain: Ventricles, cisterns and other CSF spaces are normal. Possible small old lacunar infarct over the anterior limb left internal capsule unchanged. 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 No mass, mass effect, shift of midline structures or acute hemorrhage. No evidence of acute infarction. Vascular: No hyperdense vessel or unexpected calcification. Skull: Normal. Negative for fracture or focal lesion. Sinuses/Orbits: Visualized orbits are normal. Paranasal sinuses are clear. There is hypoplastic frontal sinuses. Other: None. IMPRESSION: 1. No acute findings. 2. 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 Possible small old lacunar infarct over the anterior limb left internal capsule unchanged. Electronically Signed   By: Toribio Agreste M.D.   On: 06/24/2024 14:09   Dialysis Orders:  MWF - [facility] 4hr, 500/A1.5, EDW 64.9kg, 3K/2.5Ca bath, TDC + AVF, heparin  2000 unit bolus - Mircera 60mcg IV q 2 weeks (last 10/27) - Calcitriol  1.56mcg PO q HD  Assessment/Plan:  AMS: Unclear. Missed 1 week of HD, but BUN not high enough to suspect uremia as sole cause. Known cirrhosis with ammonia pending - could be hepatic encephalopthy. Also with recent complaints of TDC pain - considering bacteremia with blood Cx pending. On several sedating meds, but fiance reports nothing new. Would hold narcotics, sleep meds, gabapentin .  ESRD:  Usual MWF schedule - last HD 1 week ago, for HD today - using low BFR, start 2L UFG and titrate up if tolerates.  Hypertension/volume: BP high, 2L UFG to start and titrate up. Resume home BP meds.  Anemia: Hgb 12.2 - no ESA needed for now.  Metabolic bone disease: Ca ok, will add on Phos.   Cirrhosis  T2DM  Hx  CVA  Parkinson's disease: Fiance reports baseline memory is good.  Hx substance abuse: Fiance reports no recent use, cocaine  screen + as of last month  Izetta Boehringer, PA-C 06/24/2024, 3:49 PM  [facility]   Seen and examined independently.  Agree with note and exam as documented above by physician extender and as noted here.  Ms. Boling is a pleasant female with a history of ESRD on hemodialysis Monday Wednesday Friday, hypertension, diabetes type 2, cirrhosis, and prior substance abuse consents to presented to the hospital with confusion as well as numbness around the site of her dialysis catheter in the right chest.  She tells me again today that the area around her dialysis catheter is sore.  Blood cultures were drawn.  She had a CT chest which demonstrated old bilateral rib fractures and incidental thyroid  mass, and no acute findings.  She was recently found to be hypoglycemic and team initiated on D10 at 100 mL/h.  Upon arrival to the dialysis unit this was reduced to 50 mL/h.  Nephrology was consulted for status with management of end-stage renal disease.  Note EMS emergently placed an IV in the left arm and this was removed in the interim.   General adult female in bed in no acute distress HEENT normocephalic atraumatic extraocular movements intact sclera anicteric Neck supple trachea midline Lungs clear to auscultation bilaterally normal work of breathing at rest  Heart S1S2 no rub Abdomen soft nontender nondistended Extremities no edema  Psych normal mood and affect Neuro oriented to person Access RIJ tunneled catheter in use; LUE AVF with bruit and weak thrill  AMS - Would rule out infection in a patient with a catheter.  Blood cultures are pending.  She has tenderness at the catheter site but no exudate and no emergent findings noted on the CT chest abdomen pelvis that I can locate to explain her tenderness.   - missed HD can contribute but wouldn't expect to be  the sole etiology - note hx of substance abuse  - Note that she also has cirrhosis and her ammonia was elevated at 70 - giving lactulose once - titrate/continue per primary team - treat hypoglycemia  - hold sedating meds  Hypoglycemia Note that she was initiated on D10 - we have reduced the rate to 50 ml/hr Nursing is rechecking  End-stage renal disease on hemodialysis Monday Wednesday Friday -HD today  HTN  - optimizing volume status with HD   Anemia of CKD - defer ESA for now - not indicated  Metabolic bone disease - can resume binders once taking PO and mental status improved   Cirrhosis As above, will give lactulose once - titrate/continue per primary team   Katheryn JAYSON Saba, MD 06/24/2024  7:50 PM

## 2024-06-24 NOTE — ED Notes (Signed)
ED phlebotomist at bedside.

## 2024-06-24 NOTE — ED Provider Notes (Signed)
 [place] EMERGENC[facility] DE[facility]MENT AT [facility] Provider Note   CSN: [id number] Arrival date & time: 06/24/24  1100     Patient presents with: No chief complaint on file.   Carol [name] is a 63 y.o. female history of ESRD on HD MWF, DM, hypertension, Parkinson's presents reportedly with flulike symptoms.   Per fianc patient has been confused and  talking out of her head and lethargic.   Vomited numerous times on Monday but has not since.  Has had intermittent cough.    Patient herself is without any complaints today.  Patient herself is an overall poor historian.   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   Chronic back pain    Depression    Diabetes mellitus    ESRD (end stage renal disease) on dialysis [facility])    M,W,F   GERD (gastroesophageal reflux disease)    Heart murmur    Hypertension    NSTEMI (non-ST elevated myocardial infarction) (HCC) 12/20/2021   Palpitations    Parkinson's disease (HCC)    Past Surgical History:  Procedure Laterality Date   AV FISTULA PLACEMENT Left 08/01/2023   Procedure: LEFT ARM BRACHIOBASILIC FISTULA CREATION;  Surgeon: Pearline Norman RAMAN, MD;  Location: [facility] OR;  Service: Vascular;  Laterality: Left;   BALLOON DILATION N/A 11/06/2022   Procedure: BALLOON DILATION;  Surgeon: Shila Gustav GAILS, MD;  Location: [facility] ENDOSCOPY;  Service: Gastroenterology;  Laterality: N/A;   CARPAL TUNNEL RELEASE     CHOLECYSTECTOMY N/A 01/10/2024   Procedure: LAPAROSCOPIC CHOLECYSTECTOMY, ATTEMPTED;  Surgeon: Polly Cordella LABOR, MD;  Location: [facility] OR;  Service: General;  Laterality: N/A;   CHOLECYSTECTOMY N/A 01/10/2024   Procedure: OPEN CHOLECYSTECTOMY;  Surgeon: Polly Cordella LABOR, MD;  Location: [facility] OR;  Service: General;  Laterality: N/A;   COLONOSCOPY WITH PROPOFOL  N/A 11/06/2022   Procedure: COLONOSCOPY WITH PROPOFOL ;  Surgeon:  Shila Gustav GAILS, MD;  Location: [facility] ENDOSCOPY;  Service: Gastroenterology;  Laterality: N/A;   ESOPHAGOGASTRODUODENOSCOPY (EGD) WITH PROPOFOL  N/A 11/06/2022   Procedure: ESOPHAGOGASTRODUODENOSCOPY (EGD) WITH PROPOFOL ;  Surgeon: Shila Gustav GAILS, MD;  Location: [facility] ENDOSCOPY;  Service: Gastroenterology;  Laterality: N/A;   INDOCYANINE GREEN  FLUORESCENCE IMAGING (ICG) N/A 01/10/2024   Procedure: INDOCYANINE GREEN  FLUORESCENCE IMAGING (ICG);  Surgeon: Polly Cordella LABOR, MD;  Location: [facility] OR;  Service: General;  Laterality: N/A;   INSERTION OF DIALYSIS CATHETER Right 08/01/2023   Procedure: INSERTION OF TUNNELED DIALYSIS CATHETER;  Surgeon: Pearline Norman RAMAN, MD;  Location: [facility] OR;  Service: Vascular;  Laterality: Right;   IR FLUORO GUIDE CV LINE RIGHT  01/26/2022   IR FLUORO GUIDE CV LINE RIGHT  01/08/2024   IR REMOVAL TUN CV CATH W/O FL  01/06/2024   IR US  GUIDE VASC ACCESS RIGHT  01/26/2022   IR US  GUIDE VASC ACCESS RIGHT  01/08/2024   LEFT HEART CATH AND CORONARY ANGIOGRAPHY N/A 12/20/2021   Procedure: LEFT HEART CATH AND CORONARY ANGIOGRAPHY;  Surgeon: Verlin Lonni BIRCH, MD;  Location: [facility] INVASIVE CV LAB;  Service: Cardiovascular;  Laterality: N/A;   OPEN REDUCTION INTERNAL FIXATION (ORIF) FINGER WITH RADIAL BONE GRAFT Right 09/12/2015   Procedure: RIGHT MIDDLE FINGER CLOSED REDUCTION AND PINNING;  Surgeon: Prentice Pagan, MD;  Location: [facility] OR;  Service: Orthopedics;  Laterality: Right;   TONSILLECTOMY     TRANSESOPHAGEAL ECHOCARDIOGRAM (CATH LAB) N/A 01/09/2024   Procedure: TRANSESOPHAGEAL  ECHOCARDIOGRAM;  Surgeon: Mona Vinie BROCKS, MD;  Location: [facility] INVASIVE CV LAB;  Service: Cardiovascular;  Laterality: N/A;   TUBAL LIGATION       Prior to Admission medications   Medication Sig Start Date End Date Taking? Authorizing Provider  albuterol  (PROVENTIL  HFA;VENTOLIN  HFA) 108 (90 Base) MCG/ACT inhaler Inhale 2 puffs into the lungs every 4 (four) hours as needed for wheezing or shortness of  breath.    [provider]  albuterol  (VENTOLIN  HFA) 108 (90 Base) MCG/ACT inhaler Inhale 1-2 puffs into the lungs every 6 (six) hours as needed for wheezing or shortness of breath. 08/25/23   Elnor Jayson LABOR, DO  amLODipine  (NORVASC ) 10 MG tablet Take 1 tablet (10 mg total) by mouth daily. 11/01/22   Glena Harlene HERO, FNP  aspirin  EC 81 MG tablet Take 1 tablet (81 mg total) by mouth daily. Swallow whole. 05/11/22   Lemon Harlene, MD  atorvastatin  (LIPITOR ) 80 MG tablet Take 1 tablet (80 mg total) by mouth daily. 03/11/23   Lue Elsie BROCKS, MD  budesonide -formoterol  [facility]) 160-4.5 MCG/ACT inhaler Inhale 2 puffs into the lungs in the morning and at bedtime. 11/09/21   [provider]  cetirizine (ZYRTEC) 10 MG tablet Take 10 mg by mouth daily as needed for allergies.    [provider]  Darbepoetin Alfa  (ARANESP ) 60 MCG/0.3ML SOSY injection Inject 0.3 mLs (60 mcg total) into the skin every Thursday at 6pm. 08/08/23   Dezii, Alexandra, DO  eszopiclone 3 MG TABS Take 3 mg by mouth at bedtime. Take immediately before bedtime    [provider]  fluconazole  (DIFLUCAN ) 200 MG tablet Take 1 tablet (200 mg total) by mouth at bedtime. 01/16/24   Arlon Carliss ORN, DO  gabapentin  (NEURONTIN ) 100 MG capsule Take 1 capsule (100 mg total) by mouth 2 (two) times daily. 08/14/23 01/03/24  Dezii, Alexandra, DO  glucagon  1 MG injection Follow package directions for low blood sugar. 08/14/23 08/13/24  Dezii, Alexandra, DO  guaiFENesin -dextromethorphan (ROBITUSSIN DM) 100-10 MG/5ML syrup Take 5 mLs by mouth every 4 (four) hours as needed for cough. 08/25/23   Elnor Jayson LABOR, DO  lidocaine  (LIDODERM ) 5 % Place 1 patch onto the skin daily. Remove & Discard patch within 12 hours or as directed by MD 12/10/23   Garrick Charleston, MD  naloxone  (NARCAN ) nasal spray 4 mg/0.1 mL If concern for opioid overdose, spray in nostril. Call 911. 08/14/23   Dezii, Alexandra, DO  naphazoline-pheniramine  (NAPHCON-A) 0.025-0.3 % ophthalmic solution Place 2 drops into both eyes 2 (two) times daily.    [provider]  nitroGLYCERIN  (NITROSTAT ) 0.4 MG SL tablet Place 1 tablet (0.4 mg total) under the tongue every 5 (five) minutes as needed for chest pain. 05/11/22 01/03/24  Lemon Harlene, MD  oxyCODONE -acetaminophen  (PERCOCET/ROXICET) 5-325 MG tablet Take 1 tablet by mouth every 8 (eight) hours as needed for severe pain (pain score 7-10). 12/10/23   Garrick Charleston, MD  pantoprazole  (PROTONIX ) 40 MG tablet Take 1 tablet (40 mg total) by mouth daily at 12 noon. Patient taking differently: Take 40 mg by mouth at bedtime. 05/11/22   Lemon Harlene, MD  promethazine -dextromethorphan (PROMETHAZINE -DM) 6.25-15 MG/5ML syrup Take 5 mLs by mouth 4 (four) times daily as needed for cough. 11/07/23   Patt Alm Macho, MD  triamcinolone  ointment (KENALOG ) 0.1 % 1 Application 2 (two) times daily as needed. 06/29/23   [provider]    Allergies: Penicillins and Ultram  [tramadol ]    Review of Systems  Constitutional:  Positive for fever.    Updated Vital Signs BP (!) 184/86   Pulse 70   Temp 97.8 F (36.6 C) (Oral)   Resp 17   Ht 5' 6 (1.676 m)   Wt 60 kg   SpO2 98%   BMI 21.35 kg/m   Physical Exam Vitals and nursing note reviewed.  Constitutional:      General: She is not in acute distress.    Appearance: She is well-developed.  HENT:     Head: Normocephalic and atraumatic.  Eyes:     Conjunctiva/sclera: Conjunctivae normal.  Cardiovascular:     Rate and Rhythm: Normal rate and regular rhythm.     Heart sounds: No murmur heard. Pulmonary:     Effort: Pulmonary effort is normal. No respiratory distress.     Comments: Left lower lobe Rales without wheezes Abdominal:     Palpations: Abdomen is soft.     Tenderness: There is no abdominal tenderness.  Musculoskeletal:        General: No swelling.     Cervical back: Neck supple.  Skin:    General: Skin is warm and dry.      Capillary Refill: Capillary refill takes less than 2 seconds.  Neurological:     Mental Status: She is alert.     Comments: Patient is alert and oriented to self, place and time.  However still appears to be confused.  There is no abnormal phonation. Symmetric smile without facial droop. No pronator drift. Moves all extremities spontaneously. 5/5 strength in upper and lower extremities.  No sensation deficit. There is no nystagmus. EOMI, PERRL. Coordination intact with finger to nose   Psychiatric:        Mood and Affect: Mood normal.     (all labs ordered are listed, but only abnormal results are displayed) Labs Reviewed  COMPREHENSIVE METABOLIC PANEL WITH GFR - Abnormal; Notable for the following components:      Result Value   Sodium 133 (*)    Chloride 93 (*)    CO2 17 (*)    Glucose, Bld 51 (*)    BUN 67 (*)    Creatinine, Ser 11.95 (*)    Calcium  8.8 (*)    Albumin  2.6 (*)    Alkaline Phosphatase 162 (*)    GFR, Estimated 3 (*)    Anion gap 23 (*)    All other components within normal limits  SALICYLATE LEVEL - Abnormal; Notable for the following components:   Salicylate Lvl <7.0 (*)    All other components within normal limits  CBG MONITORING, ED - Abnormal; Notable for the following components:   Glucose-Capillary 66 (*)    All other components within normal limits  CBG MONITORING, ED - Abnormal; Notable for the following components:   Glucose-Capillary 122 (*)    All other components within normal limits  RESP PANEL BY RT-PCR (RSV, FLU A&B, COVID)  [id number]  CULTURE, BLOOD (ROUTINE X 2)  CULTURE, BLOOD (ROUTINE X 2)  CBC WITH DIFFERENTIAL/PLATELET  ETHANOL  ACETAMINOPHEN  LEVEL  URINALYSIS, ROUTINE W REFLEX MICROSCOPIC  RAPID URINE DRUG SCREEN, HOSP PERFORMED  AMMONIA  CK  HEPATITIS B SURFACE ANTIGEN  HEPATITIS B SURFACE ANTIBODY, QUANTITATIVE  I-STAT CG4 LACTIC ACID, ED  I-STAT CG4 LACTIC ACID, ED  TROPONIN I (HIGH SENSITIVITY)  TROPONIN I (HIGH SENSITIVITY)     EKG: EKG Interpretation Date/Time:  Wednesday June 24 2024 11:58:27 EST Ventricular Rate:  71 PR Interval:  164 QRS Duration:  116  QT Interval:  477 QTC Calculation: 519 R Axis:   -29  Text Interpretation: Sinus rhythm Nonspecific intraventricular conduction delay Confirmed by Neysa Clap [phone]) on 06/24/2024 12:05:17 PM  Radiology: [facility] Chest Port 1 View Result Date: 06/24/2024 CLINICAL DATA:  Cough. EXAM: PORTABLE CHEST 1 VIEW COMPARISON:  Chest radiograph dated 06/07/2026. FINDINGS: Right-sided dialysis catheter in similar position. No focal consolidation, pleural effusion or pneumothorax. There is cardiomegaly with vascular congestion. No acute osseous pathology. IMPRESSION: Cardiomegaly with vascular congestion. No focal consolidation. Electronically Signed   By: Vanetta Chou M.D.   On: 06/24/2024 14:57   CT Head Wo Contrast Result Date: 06/24/2024 CLINICAL DATA:  Mental status change. EXAM: CT HEAD WITHOUT CONTRAST TECHNIQUE: Contiguous axial images were obtained from the base of the skull through the vertex without intravenous contrast. RADIATION DOSE REDUCTION: This exam was performed according to the departmental dose-optimization program which includes automated exposure control, adjustment of the mA and/or kV according to patient size and/or use of iterative reconstruction technique. COMPARISON:  06/07/2024 FINDINGS: Brain: Ventricles, cisterns and other CSF spaces are normal. Possible small old lacunar infarct over the anterior limb left internal capsule unchanged. 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 No mass, mass effect, shift of midline structures or acute hemorrhage. No evidence of acute infarction. Vascular: No hyperdense vessel or unexpected calcification. Skull: Normal. Negative for fracture or focal lesion. Sinuses/Orbits: Visualized orbits are normal. Paranasal sinuses are clear. There is hypoplastic frontal sinuses. Other: None. IMPRESSION: 1. No  acute findings. 2. 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 Possible small old lacunar infarct over the anterior limb left internal capsule unchanged. Electronically Signed   By: Toribio Agreste M.D.   On: 06/24/2024 14:09     Procedures   Medications Ordered in the ED  dextrose  10 % infusion (has no administration in time range)  dextrose  50 % solution 50 mL (50 mLs Intravenous Given 06/24/24 1427)  iohexol  (OMNIPAQUE ) 350 MG/ML injection 75 mL (75 mLs Intravenous Contrast Given 06/24/24 1551)    Clinical Course as of 06/24/24 1556  Wed Jun 24, 2024  1133 Patient evaluated for reported confusion and lethargy without obvious source for the past few days.  Reportedly was vomiting on Monday but has not since.  Has had intermittent cough.  Upon arrival patient is hemodynamically stable.  She does appear to be confused, however following commands moving all extremities spontaneously.  She has no neurodeficits on exam.  She does have left lower lobe Rales without wheezing.  Her abdomen is nontender.  Will obtain infectious altered mental status workup. [JT]  1324 I-Stat CG4 Lactic Acid Without elevation [JT]  1326 ED EKG Sinus rhythm, appears unchanged [JT]  1332 CBC with Differential Unremarkable [JT]  1351 Resp panel by RT-PCR (RSV, Flu A&B, Covid) Anterior Nasal Swab Negative [JT]  1358 Comprehensive metabolic panel(!) BUN of 67, glucose of 51, bicarb of 17 and anion gap of 23 likely secondary to uremia in the setting of dialysis noncompliance.  Will give dextrose  IV 50 [JT]  1400 Acetaminophen  level Within normal limits [JT]  1400 Ethanol Without elevation [JT]  1417 CT Head Wo Contrast No acute findings [JT]  1503 DG Chest Port 1 View Cardiomegaly with vascular congestion, no consolidation [JT]  1521 POC CBG, ED(!) Repeat CBG 122. Started on D10 drip  [JT]  1527 Discussed patient with nephrology, Dr. Jerrye.  Her altered mental status may be secondary to uremia in the setting  of dialysis noncompliance.  Will come and evaluate [JT]  1540 Hospitalist consulted [JT]  1555 Discussed patient with Dr. Odell, hospitalist agreed for admission for altered mental status.  Some labs are still pending at this time including troponin, CK, ammonia.  Nursing will attempt again for In-N-Out for urine.  CT chest abdomen pelvis pending [JT]    Clinical Course User Index [JT] Donnajean Lynwood DEL, PA-C                                 Medical Decision Making Amount and/or Complexity of Data Reviewed Labs: ordered. Radiology: ordered.   This patient presents to the ED with chief complaint(s) of cough.  The complaint involves an extensive differential diagnosis and also carries with it a high risk of complications and morbidity.   Pertinent past medical history as listed in HPI  The differential diagnosis includes  Patient has no meningismus, no evidence of intracranial hemorrhage on CT. No evidence of pneumonia on x-ray.  Additional history obtained: Additional history obtained from significant other Records reviewed Care Everywhere/External Records  Disposition:   Patient will be admitted for  Social Determinants of Health:   none  This note was dictated with voice recognition software.  Despite best efforts at proofreading, errors may have occurred which can change the documentation meaning.       Final diagnoses:  Altered mental status, unspecified altered mental status type    ED Discharge Orders     None          Donnajean Lynwood DEL, PA-C 06/24/24 1556    Neysa Caron PARAS, DO 06/24/24 1608

## 2024-06-24 NOTE — ED Notes (Signed)
 IV team at bedside

## 2024-06-24 NOTE — ED Notes (Addendum)
 EDP made aware of fistula placement in left arm/IV start by EMS in restricted arm/ D50 given. IV to be removed at this time.

## 2024-06-24 NOTE — ED Triage Notes (Signed)
 Fl;u like sx fever,n,v,d since last Wednesday. Pt has missed Wednesday since last Wednesday as well. Fiance called out due to talking out of her head and lethargy. Axox3 on arrival.

## 2024-06-25 DIAGNOSIS — N19 Unspecified kidney failure: Secondary | ICD-10-CM

## 2024-06-25 LAB — CBC
HCT: 34.6 % — ABNORMAL LOW (ref 36.0–46.0)
Hemoglobin: 11.6 g/dL — ABNORMAL LOW (ref 12.0–15.0)
MCH: 27.8 pg (ref 26.0–34.0)
MCHC: 33.5 g/dL (ref 30.0–36.0)
MCV: 83 fL (ref 80.0–100.0)
Platelets: 280 K/uL (ref 150–400)
RBC: 4.17 MIL/uL (ref 3.87–5.11)
RDW: 14.3 % (ref 11.5–15.5)
WBC: 6.6 K/uL (ref 4.0–10.5)
nRBC: 0 % (ref 0.0–0.2)

## 2024-06-25 LAB — MRSA NEXT GEN BY PCR, NASAL: MRSA by PCR Next Gen: NOT DETECTED

## 2024-06-25 LAB — GLUCOSE, CAPILLARY
Glucose-Capillary: 100 mg/dL — ABNORMAL HIGH (ref 70–99)
Glucose-Capillary: 104 mg/dL — ABNORMAL HIGH (ref 70–99)
Glucose-Capillary: 121 mg/dL — ABNORMAL HIGH (ref 70–99)
Glucose-Capillary: 125 mg/dL — ABNORMAL HIGH (ref 70–99)
Glucose-Capillary: 153 mg/dL — ABNORMAL HIGH (ref 70–99)
Glucose-Capillary: 76 mg/dL (ref 70–99)
Glucose-Capillary: 99 mg/dL (ref 70–99)

## 2024-06-25 LAB — HEPATITIS B SURFACE ANTIBODY, QUANTITATIVE: Hep B S AB Quant (Post): 22.8 m[IU]/mL

## 2024-06-25 LAB — BASIC METABOLIC PANEL WITH GFR
Anion gap: 16 — ABNORMAL HIGH (ref 5–15)
BUN: 25 mg/dL — ABNORMAL HIGH (ref 8–23)
CO2: 23 mmol/L (ref 22–32)
Calcium: 8.7 mg/dL — ABNORMAL LOW (ref 8.9–10.3)
Chloride: 93 mmol/L — ABNORMAL LOW (ref 98–111)
Creatinine, Ser: 6.69 mg/dL — ABNORMAL HIGH (ref 0.44–1.00)
GFR, Estimated: 7 mL/min — ABNORMAL LOW (ref >60)
Glucose, Bld: 105 mg/dL — ABNORMAL HIGH (ref 70–99)
Potassium: 3.4 mmol/L — ABNORMAL LOW (ref 3.5–5.1)
Sodium: 132 mmol/L — ABNORMAL LOW (ref 135–145)

## 2024-06-25 LAB — HIV ANTIBODY (ROUTINE TESTING W REFLEX): HIV Screen 4th Generation wRfx: NONREACTIVE

## 2024-06-25 MED ORDER — LACTULOSE 10 GM/15ML PO SOLN
20.0000 g | Freq: Two times a day (BID) | ORAL | Status: AC
  Administered 2024-06-25 (×2): 20 g via ORAL
  Filled 2024-06-25 (×2): qty 30

## 2024-06-25 MED ORDER — LACTULOSE 10 GM/15ML PO SOLN: 20.0000 g | Freq: Two times a day (BID) | ORAL | Status: DC

## 2024-06-25 NOTE — Progress Notes (Signed)
 [facility] PROGRESS NOTE    Progress Note  [name]  FMW:[id number] DOB: [birth year] DOA: 06/24/2024 PCP: Dor[facility], [facility]     Brief Narrative:   Carol [name] is an 63 y.o. female past medical history diabetes mellitus essential hypertension, chronic diastolic dysfunction end-stage renal disease on hemodialysis, polysubstance abuse  (including cocaine  tobacco and alcohol), recently discharged from the hospital for sepsis secondary to fungemia and Burkholderia bacteremia who was brought in by her fianc for confusion on the day of admission.  He relates she has not had dialysis since 10/29    Assessment/Plan:   Uremia encephalopathy/ End stage renal disease [facility]) Monday Wednesday Friday : Has not had dialysis since 06/17/2024. With pulmonary edema. Nephrology was consulted status post HD. Respiratory panel is negative, alcohol and salicylate level undetectable. Blood cultures have been sent as she has been complaining of tenderness around her catheter area not erythematous and  no purulent drainage. Ammonia level was elevated was given 2 doses of lactulose.  DM2 (diabetes mellitus, type 2) (HCC) Hemoglobin A1c was 4.1  Essential hypertension Resume home Norvasc .  Hypervolemic hyponatremia: Per dialysis.  Left thyroid  nodule: Follow-up PCP as an outpatient  Class 2 obesity due to excess calories with body mass index (BMI) of 37.0 to 37.9 in adult   DVT prophylaxis: lovenox  Family Communication:none Status is: Inpatient Remains inpatient appropriate because: Uremic encephalopathy    Code Status:     Code Status Orders  (From admission, onward)           Start     Ordered   06/24/24 1614  Full code  Continuous       Question:  By:  Answer:  Consent: discussion documented in EHR   06/24/24 1615           Code Status History     Date Active Date Inactive Code Status Order ID Comments User Context   01/03/2024 2031 01/16/2024  1540 Full Code [id number]  Arthea Child, MD ED   08/11/2023 0359 08/14/2023 1859 Full Code [id number]  Marcene Eva NOVAK, DO ED   07/31/2023 0838 08/05/2023 2111 Full Code [id number]  Claudene Maximino LABOR, MD ED   03/22/2023 1309 03/29/2023 1732 Full Code [id number]  Shona Terry SAILOR, DO ED   03/07/2023 2232 03/11/2023 2252 Full Code [id number]  Lonzell Emeline HERO, DO ED   11/01/2022 1031 11/07/2022 1847 Full Code [id number]  Waddell Rake, MD ED   07/22/2022 0031 07/22/2022 2059 Full Code [id number]  Waddell Rake, MD ED   05/09/2022 1010 05/11/2022 1758 Full Code [id number]  Leontine Elbe, DO ED   03/22/2022 1206 03/27/2022 1719 Full Code [id number]  Barbarann Nest, MD ED   03/10/2022 0636 03/17/2022 2253 Full Code [id number]  Alfornia Madison, MD ED   01/22/2022 1343 02/04/2022 1852 Full Code [id number]  Ricky Alfrieda DASEN, DO ED   12/31/2021 0204 01/03/2022 1745 Full Code [id number]  Laurence Locus, DO ED   12/20/2021 0459 12/24/2021 1952 Full Code [id number]  Marlin Lonni LABOR, MD ED   08/24/2014 1437 08/25/2014 0342 Full Code [id number]  Alona Corners, DO HOV   01/29/2014 2153 02/02/2014 1638 Full Code [id number]  Silvester Ales, MD Inpatient         IV Access:   Peripheral IV   Procedures and diagnostic studies:   CT CHEST ABDOMEN PELVIS W CONTRAST Result Date: 06/24/2024 EXAM: CT CHEST, ABDOMEN AND PELVIS WITH CONTRAST 06/24/2024 03:50:00 PM TECHNIQUE: CT of the chest, abdomen and pelvis was performed with the  administration of 75 mL of iohexol  (OMNIPAQUE ) 350 MG/ML injection. Multiplanar reformatted images are provided for review. Automated exposure control, iterative reconstruction, and/or weight based adjustment of the mA/kV was utilized to reduce the radiation dose to as low as reasonably achievable. COMPARISON: None available. CLINICAL HISTORY: AMS FINDINGS: CHEST: MEDIASTINUM AND LYMPH NODES: Heart and pericardium demonstrate mild cardiomegaly. Aortic atherosclerosis is present. A 3 cm left thyroid  mass is noted. The  central airways are clear. No mediastinal, hilar or axillary lymphadenopathy. LUNGS AND PLEURA: Minimal bilateral posterior basilar septum atelectasis. No focal consolidation or pulmonary edema. No pleural effusion or pneumothorax. ABDOMEN AND PELVIS: LIVER: The liver is unremarkable. GALLBLADDER AND BILE DUCTS: Status post cholecystectomy. No biliary ductal dilatation. SPLEEN: No acute abnormality. PANCREAS: No acute abnormality. ADRENAL GLANDS: No acute abnormality. KIDNEYS, URETERS AND BLADDER: No stones in the kidneys or ureters. No hydronephrosis. No perinephric or periureteral stranding. Urinary bladder is unremarkable. GI AND BOWEL: Stomach demonstrates no acute abnormality. There is no bowel obstruction. REPRODUCTIVE ORGANS: No acute abnormality. PERITONEUM AND RETROPERITONEUM: No ascites. No free air. VASCULATURE: Aorta is normal in caliber. ABDOMINAL AND PELVIS LYMPH NODES: No lymphadenopathy. BONES AND SOFT TISSUES: Old bilateral rib fractures are noted. No acute osseous abnormality. No focal soft tissue abnormality. IMPRESSION: 1. Mild cardiomegaly. 2. Aortic atherosclerosis. 3. Left thyroid  nodule/mass measuring approximately 3 cm; recommend non-emergent thyroid  ultrasound. 4. Old bilateral rib fractures. 5. Minimal posterior basilar atelectasis. 6. Status post cholecystectomy. Electronically signed by: Lynwood Seip MD 06/24/2024 04:06 PM EST RP Workstation: [id number]   DG Chest Port 1 View Result Date: 06/24/2024 CLINICAL DATA:  Cough. EXAM: PORTABLE CHEST 1 VIEW COMPARISON:  Chest radiograph dated 06/07/2026. FINDINGS: Right-sided dialysis catheter in similar position. No focal consolidation, pleural effusion or pneumothorax. There is cardiomegaly with vascular congestion. No acute osseous pathology. IMPRESSION: Cardiomegaly with vascular congestion. No focal consolidation. Electronically Signed   By: Vanetta Chou M.D.   On: 06/24/2024 14:57   CT Head Wo Contrast Result Date:  06/24/2024 CLINICAL DATA:  Mental status change. EXAM: CT HEAD WITHOUT CONTRAST TECHNIQUE: Contiguous axial images were obtained from the base of the skull through the vertex without intravenous contrast. RADIATION DOSE REDUCTION: This exam was performed according to the departmental dose-optimization program which includes automated exposure control, adjustment of the mA and/or kV according to patient size and/or use of iterative reconstruction technique. COMPARISON:  06/07/2024 FINDINGS: Brain: Ventricles, cisterns and other CSF spaces are normal. Possible small old lacunar infarct over the anterior limb left internal capsule unchanged. 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 No mass, mass effect, shift of midline structures or acute hemorrhage. No evidence of acute infarction. Vascular: No hyperdense vessel or unexpected calcification. Skull: Normal. Negative for fracture or focal lesion. Sinuses/Orbits: Visualized orbits are normal. Paranasal sinuses are clear. There is hypoplastic frontal sinuses. Other: None. IMPRESSION: 1. No acute findings. 2. 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 Possible small old lacunar infarct over the anterior limb left internal capsule unchanged. Electronically Signed   By: Toribio Agreste M.D.   On: 06/24/2024 14:09     Medical Consultants:   None.   Subjective:    Ray Glacken feels better no complaints  Objective:    Vitals:   06/24/24 2100 06/24/24 2120 06/24/24 2216 06/25/24 0218  BP: (!) 188/102 (!) 150/102 (!) 182/83 (!) 154/86  Pulse:   81 76  Resp:   20 20  Temp:  98.5 F (36.9 C) 98.3 F (36.8 C) 98.4 F (36.9 C)  TempSrc:  Oral Oral Oral  SpO2: 100%  94% 99%  Weight:   62.2 kg   Height:   5' 6 (1.676 m)    SpO2: 99 %   Intake/Output Summary (Last 24 hours) at 06/25/2024 0700 Last data filed at 06/25/2024 0600 Gross per 24 hour  Intake 623.22 ml  Output 2500 ml  Net -1876.78 ml   Filed Weights    06/24/24 1103 06/24/24 2216  Weight: 60 kg 62.2 kg    Exam: General exam: In no acute distress. Respiratory system: Good air movement and clear to auscultation. Cardiovascular system: S1 & S2 heard, RRR. No JVD. Gastrointestinal system: Abdomen is nondistended, soft and nontender.  Extremities: No pedal edema. Skin: No rashes, lesions or ulcers Psychiatry: Judgement and insight appear normal. Mood & affect appropriate.    Data Reviewed:    Labs: Basic Metabolic Panel: Recent Labs  Lab 06/24/24 1253 06/24/24 2233 06/25/24 0345  NA 133*  --  132*  K 4.1  --  3.4*  CL 93*  --  93*  CO2 17*  --  23  GLUCOSE 51*  --  105*  BUN 67*  --  25*  CREATININE 11.95* 6.41* 6.69*  CALCIUM  8.8*  --  8.7*   GFR Estimated Creatinine Clearance: 8.3 mL/min (A) (by C-G formula based on SCr of 6.69 mg/dL (H)). Liver Function Tests: Recent Labs  Lab 06/24/24 1253  AST 32  ALT 14  ALKPHOS 162*  BILITOT 0.9  PROT 7.9  ALBUMIN  2.6*   No results for input(s): LIPASE, AMYLASE in the last 168 hours. Recent Labs  Lab 06/24/24 1355  AMMONIA 70*   Coagulation profile No results for input(s): INR, PROTIME in the last 168 hours. COVID-19 Labs  No results for input(s): DDIMER, FERRITIN, LDH, CRP in the last 72 hours.  Lab Results  Component Value Date   SARSCOV2NAA NEGATIVE 06/24/2024   SARSCOV2NAA NEGATIVE 06/07/2024   SARSCOV2NAA NEGATIVE 01/03/2024   SARSCOV2NAA NEGATIVE 11/07/2023    CBC: Recent Labs  Lab 06/24/24 1253 06/24/24 2233 06/25/24 0345  WBC 6.5 5.8 6.6  NEUTROABS 3.6  --   --   HGB 12.2 13.9 11.6*  HCT 36.7 41.0 34.6*  MCV 84.8 83.3 83.0  PLT 288 297 280   Cardiac Enzymes: Recent Labs  Lab 06/24/24 1358  CKTOTAL 249*   BNP (last 3 results) No results for input(s): PROBNP in the last 8760 hours. CBG: Recent Labs  Lab 06/24/24 1838 06/24/24 1926 06/24/24 2054 06/24/24 2235 06/25/24 0419  GLUCAP 99 89 270* 86 100*    D-Dimer: No results for input(s): DDIMER in the last 72 hours. Hgb A1c: Recent Labs    06/24/24 2233  HGBA1C 4.7*   Lipid Profile: No results for input(s): CHOL, HDL, LDLCALC, TRIG, CHOLHDL, LDLDIRECT in the last 72 hours. Thyroid  function studies: No results for input(s): TSH, T4TOTAL, T3FREE, THYROIDAB in the last 72 hours.  Invalid input(s): FREET3 Anemia work up: No results for input(s): VITAMINB12, FOLATE, FERRITIN, TIBC, IRON , RETICCTPCT in the last 72 hours. Sepsis Labs: Recent Labs  Lab 06/24/24 1253 06/24/24 1303 06/24/24 1633 06/24/24 2233 06/25/24 0345  WBC 6.5  --   --  5.8 6.6  LATICACIDVEN  --  0.9 0.8  --   --    Microbiology Recent Results (from the past 240 hours)  Resp panel by RT-PCR (RSV, Flu A&B, Covid) Anterior Nasal Swab     Status: None   Collection Time: 06/24/24 11:19 AM   Specimen:  Anterior Nasal Swab  Result Value Ref Range Status   SARS Coronavirus 2 by RT PCR NEGATIVE NEGATIVE Final   Influenza A by PCR NEGATIVE NEGATIVE Final   Influenza B by PCR NEGATIVE NEGATIVE Final    Comment: (NOTE) The Xpert Xpress SARS-CoV-2/FLU/RSV plus assay is intended as an aid in the diagnosis of influenza from Nasopharyngeal swab specimens and should not be used as a sole basis for treatment. Nasal washings and aspirates are unacceptable for Xpert Xpress SARS-CoV-2/FLU/RSV testing.  Fact Sheet for Patients: bloggercourse.com  Fact Sheet for Healthcare Providers: seriousbroker.it  This test is not yet approved or cleared by the [place]  FDA and has been authorized for detection and/or diagnosis of SARS-CoV-2 by FDA under an Emergency Use Authorization (EUA). This EUA will remain in effect (meaning this test can be used) for the duration of the COVID-19 declaration under Section 564(b)(1) of the Act, 21 U.S.C. section 360bbb-3(b)(1), unless the authorization is  terminated or revoked.     Resp Syncytial Virus by PCR NEGATIVE NEGATIVE Final    Comment: (NOTE) Fact Sheet for Patients: bloggercourse.com  Fact Sheet for Healthcare Providers: seriousbroker.it  This test is not yet approved or cleared by the [place]  FDA and has been authorized for detection and/or diagnosis of SARS-CoV-2 by FDA under an Emergency Use Authorization (EUA). This EUA will remain in effect (meaning this test can be used) for the duration of the COVID-19 declaration under Section 564(b)(1) of the Act, 21 U.S.C. section 360bbb-3(b)(1), unless the authorization is terminated or revoked.  Performed at [facility] Lab, [address], [place], [place] [address]   Blood culture (routine x 2)     Status: None (Preliminary result)   Collection Time: 06/24/24 11:36 AM   Specimen: BLOOD  Result Value Ref Range Status   Specimen Description BLOOD RIGHT ANTECUBITAL  Final   Special Requests   Final    BOTTLES DRAWN AEROBIC ONLY Blood Culture results may not be optimal due to an inadequate volume of blood received in culture bottles   Culture   Final    NO GROWTH < 24 HOURS Performed at [facility], [address] [place], [place] [address]    Report Status PENDING  Incomplete  Blood culture (routine x 2)     Status: None (Preliminary result)   Collection Time: 06/24/24 11:41 AM   Specimen: BLOOD  Result Value Ref Range Status   Specimen Description BLOOD BLOOD RIGHT HAND  Final   Special Requests   Final    BOTTLES DRAWN AEROBIC ONLY Blood Culture results may not be optimal due to an inadequate volume of blood received in culture bottles   Culture   Final    NO GROWTH < 24 HOURS Performed at [facility], [address], [place], [place] [address]    Report Status PENDING  Incomplete  MRSA Next Gen by PCR, Nasal     Status: None   Collection Time: 06/24/24 10:39 PM   Specimen: Nasal Mucosa;  Nasal Swab  Result Value Ref Range Status   MRSA by PCR Next Gen NOT DETECTED NOT DETECTED Final    Comment: (NOTE) The GeneXpert MRSA Assay (FDA approved for NASAL specimens only), is one component of a comprehensive MRSA colonization surveillance program. It is not intended to diagnose MRSA infection nor to guide or monitor treatment for MRSA infections. Test performance is not FDA approved in patients less than 67 years old. Performed at [facility]  Lab, [address] [place], [place] [address]      Medications:    amLODipine   10 mg Oral Daily   aspirin  EC  81 mg Oral Daily   atorvastatin   80 mg Oral Daily   Chlorhexidine  Gluconate Cloth  6 each Topical Daily   fluticasone  furoate-vilanterol  1 puff Inhalation Daily   heparin   5,000 Units Subcutaneous Q8H   naphazoline-pheniramine  2 drop Both Eyes BID   pantoprazole   40 mg Oral Q1200   polyethylene glycol  17 g Oral BID   sodium chloride  flush  10-40 mL Intracatheter Q12H   sodium chloride  flush  3 mL Intravenous Q12H   Continuous Infusions:  sodium chloride      anticoagulant sodium citrate      dextrose  50 mL/hr at 06/24/24 2220      LOS: 1 day   Erle Odell Castor  [facility]  06/25/2024, 7:00 AM

## 2024-06-25 NOTE — TOC Initial Note (Addendum)
 Transition of Care [facility]) - Initial/Assessment Note    Patient Details  Name: Carol [name] MRN: [id number] Date of Birth: 10-31-1960  [facility] CM/SW Contact:    Lendia Dais, LCSWA Phone Number: 06/25/2024, 9:52 AM  Clinical Narrative: CSW spoke to pt and pt's fiance Curtistine at bedside. Pt was able to answer some questions but was lethargic, Curtistine answered most questions. ' Pt is from home alone, partially independent with ADL's. Anothony states the pt typically needs assistance after HD with walking. Pt has DME of a cane, walker, inhaler and shower chair. Pt stated they would like to receive a scooter chair as DME. RNCM notified.  Pt has seen PCP in the last year and does not have a HCPOA. Pt stated that she wants Curtistine to make decisions for her  Pt is able to afford basic needs of food, medicine, and housing. Curtistine reported they are working on getting a car and the pt has transportation to HD through [facility]. Other trips are made by cabs/ubers. Pt's source of income in social security and disability.  Curtistine stated that the pt is pretty consistent with her medicine but occasionally will forget and has to remind the pt.   Pt has Hx of MH/SU. Curtistine stated that the pt has anxiety and takes medication for it. 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 CSW asked the pt if they would like resources for MH/SU and the pt was agreeable. Resources in [place].  No further TOC needs. Please place consult for TOC needs.      Expected Discharge Plan: Home/Self Care     Patient Goals and CMS Choice Patient states their goals for this hospitalization and ongoing recovery are:: To be able to cook more   Choice offered to / list presented to : NA      Expected Discharge Plan and Services In-house Referral: Clinical Social Work     Living arrangements for the past 2 months: Apartment                                       Prior Living Arrangements/Services Living arrangements for the past 2 months: Apartment Lives with:: Self   Do you feel safe going back to the place where you live?: Yes      Need for Family Participation in Patient Care: Yes (Comment) Care giver support system in place?: Yes (comment) Current home services: DME Criminal Activity/Legal Involvement Pertinent to Current Situation/Hospitalization: No - Comment as needed  Activities of Daily Living   ADL Screening (condition at time of admission) Independently performs ADLs?: Yes (appropriate for developmental age) Is the patient deaf or have difficulty hearing?: No Does the patient have difficulty seeing, even when wearing glasses/contacts?: No Does the patient have difficulty concentrating, remembering, or making decisions?: Yes  Permission Sought/Granted Permission sought to share information with : Family Supports Permission granted to share information with : Yes, Verbal Permission Granted  Share Information with NAME: Curtistine Pool     Permission granted to share info w Relationship: fiance  Permission granted to share info w Contact Information: [phone]  Emotional Assessment Appearance:: Appears stated age Attitude/Demeanor/Rapport: Lethargic Affect (typically observed): Pleasant, Restless Orientation: : Oriented to Self, Oriented to Place, Oriented to Situation Alcohol / Substance Use: Illicit Drugs Psych Involvement: No (comment)  Admission diagnosis:  Uremia [N19] Uremic encephalopathy [  G93.49, N19] Altered mental status, unspecified altered mental status type [R41.82] Patient Active Problem List   Diagnosis Date Noted   Uremia 06/24/2024   Bacteremia 01/10/2024   Cholecystitis 01/10/2024   Candidemia (HCC) 01/05/2024   Sepsis (HCC) 01/03/2024   Hypokalemia 08/11/2023   History of COPD 08/11/2023   History of anemia due to chronic kidney disease 08/11/2023   End stage renal disease (HCC)  08/03/2023   Overweight (BMI 25.0-29.9) 08/03/2023   Chest pain 07/31/2023   Hypertensive emergency 07/31/2023   Anemia of chronic disease 07/31/2023   Chronic pain 07/31/2023   Polysubstance abuse (HCC) 07/31/2023   COPD (chronic obstructive pulmonary disease) (HCC) 03/27/2023   Chronic anemia 03/27/2023   Acute on chronic systolic (congestive) heart failure (HCC) 03/08/2023   Volume overload 03/07/2023   Heme positive stool 11/06/2022   Anemia due to chronic blood loss 11/06/2022   Colon cancer screening 11/03/2022   Hypoglycemia 11/02/2022   MDD (major depressive disorder), recurrent episode, moderate (HCC) 11/02/2022   Abnormal barium swallow 11/02/2022   Esophageal stricture 11/02/2022   Esophageal dysmotility 11/02/2022   Antiplatelet or antithrombotic long-term use 11/02/2022   Chronic diastolic CHF (congestive heart failure) (HCC) 11/01/2022   Dysphagia 11/01/2022   Acute on chronic diastolic CHF (congestive heart failure) (HCC) 05/09/2022   Metabolic acidosis, increased anion gap 05/09/2022   Acute on chronic diastolic (congestive) heart failure (HCC) 03/22/2022   COPD not affecting current episode of care 03/22/2022   Class 2 obesity due to excess calories with body mass index (BMI) of 37.0 to 37.9 in adult 03/22/2022   Hypomagnesemia 03/15/2022   CHF (congestive heart failure) (HCC) 03/14/2022   Uremic encephalopathy 03/10/2022   borderline Prolonged QT interval 01/22/2022   Elevated troponin 01/22/2022   Thyroid  nodule 01/22/2022   Cocaine  use disorder, mild, abuse (HCC) 01/02/2022   Diabetic hypoglycemia (HCC) 12/31/2021   CKD (chronic kidney disease) stage 4, GFR 15-29 ml/min (HCC) 12/31/2021   CAD in native artery 12/24/2021   Anxiety with depression 12/24/2021   Pure hypercholesterolemia    DM2 (diabetes mellitus, type 2) (HCC) 01/29/2014   Essential hypertension 01/29/2014   Closed jaw fracture (HCC) 01/29/2014   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 [place] [address] Phone: [phone] Fax: [phone]  [id number] [place], [place] - [address] AT [facility] [address] [place] [address] Phone: [phone] Fax: [phone]     Social Drivers of Health (SDOH) Social History: SDOH Screenings   Food Insecurity: No Food Insecurity (01/03/2024)  Housing: Low Risk  (01/03/2024)  Transportation Needs: No Transportation Needs (01/03/2024)  Utilities: Not At Risk (01/03/2024)  Financial Resource Strain: Low Risk  (04/04/2022)  Tobacco Use: High Risk (06/24/2024)   SDOH Interventions:     Readmission Risk Interventions    01/14/2024    9:39 AM 08/02/2023    5:29 PM 03/25/2023    4:51 PM  Readmission Risk Prevention Plan  Transportation Screening Complete Complete Complete  Medication Review Oceanographer) Complete Complete Complete  PCP or Specialist appointment within 3-5 days of discharge Complete    HRI or Home Care Consult Complete Complete Complete  SW Recovery Care/Counseling Consult

## 2024-06-25 NOTE — Progress Notes (Addendum)
 [facility] Progress Note   Subjective:   Pt was seen resting in bed, fiance at bedside. Patient was more alert this morning, answer questions appropriately. Stated that she did not know where she was yesterday. Patient still restless with pain at [facility]. Reported intermittent fever and chills. Denied CP, dyspnea, nausea, and vomiting.   Objective Vitals:   06/24/24 2216 06/25/24 0218 06/25/24 0758 06/25/24 0809  BP: (!) 182/83 (!) 154/86 (!) 159/77   Pulse: 81 76 76   Resp: 20 20 19    Temp: 98.3 F (36.8 C) 98.4 F (36.9 C) 99 F (37.2 C)   TempSrc: Oral Oral Oral   SpO2: 94% 99% 94% 94%  Weight: 62.2 kg     Height: 5' 6 (1.676 m)      Physical Exam General: Ill appearing woman in NAD. Room air. Heart:RRR no murmur Lungs: CTA anteriorly, no wheezing or rhonchi Abdomen: soft, non-tender, non-distended  Extremities: No BLE edema Dialysis Access: TDC in R chest - no exit site drainage. LUE AVF +t/b but deep (1st stage BVT)   Additional Objective Labs: Basic Metabolic Panel: Recent Labs  Lab 06/24/24 1253 06/24/24 2233 06/25/24 0345  NA 133*  --  132*  K 4.1  --  3.4*  CL 93*  --  93*  CO2 17*  --  23  GLUCOSE 51*  --  105*  BUN 67*  --  25*  CREATININE 11.95* 6.41* 6.69*  CALCIUM  8.8*  --  8.7*   Liver Function Tests: Recent Labs  Lab 06/24/24 1253  AST 32  ALT 14  ALKPHOS 162*  BILITOT 0.9  PROT 7.9  ALBUMIN  2.6*    CBC: Recent Labs  Lab 06/24/24 1253 06/24/24 2233 06/25/24 0345  WBC 6.5 5.8 6.6  NEUTROABS 3.6  --   --   HGB 12.2 13.9 11.6*  HCT 36.7 41.0 34.6*  MCV 84.8 83.3 83.0  PLT 288 297 280   Blood Culture    Component Value Date/Time   SDES BLOOD BLOOD RIGHT HAND 06/24/2024 1141   SPECREQUEST  06/24/2024 1141    BOTTLES DRAWN AEROBIC ONLY Blood Culture results may not be optimal due to an inadequate volume of blood received in culture bottles   CULT  06/24/2024 1141    NO GROWTH < 24 HOURS Performed at [facility], [address] [place], [place] [address]    REPTSTATUS PENDING 06/24/2024 1141    Cardiac Enzymes: Recent Labs  Lab 06/24/24 1358  CKTOTAL 249*   CBG: Recent Labs  Lab 06/24/24 1926 06/24/24 2054 06/24/24 2235 06/25/24 0419 06/25/24 0800  GLUCAP 89 270* 86 100* 104*   Studies/Results: CT CHEST ABDOMEN PELVIS W CONTRAST Result Date: 06/24/2024 EXAM: CT CHEST, ABDOMEN AND PELVIS WITH CONTRAST 06/24/2024 03:50:00 PM TECHNIQUE: CT of the chest, abdomen and pelvis was performed with the administration of 75 mL of iohexol  (OMNIPAQUE ) 350 MG/ML injection. Multiplanar reformatted images are provided for review. Automated exposure control, iterative reconstruction, and/or weight based adjustment of the mA/kV was utilized to reduce the radiation dose to as low as reasonably achievable. COMPARISON: None available. CLINICAL HISTORY: AMS FINDINGS: CHEST: MEDIASTINUM AND LYMPH NODES: Heart and pericardium demonstrate mild cardiomegaly. Aortic atherosclerosis is present. A 3 cm left thyroid  mass is noted. The central airways are clear. No mediastinal, hilar or axillary lymphadenopathy. LUNGS AND PLEURA: Minimal bilateral posterior basilar septum atelectasis. No focal consolidation or pulmonary edema. No pleural effusion or pneumothorax. ABDOMEN AND PELVIS: LIVER: The liver  is unremarkable. GALLBLADDER AND BILE DUCTS: Status post cholecystectomy. No biliary ductal dilatation. SPLEEN: No acute abnormality. PANCREAS: No acute abnormality. ADRENAL GLANDS: No acute abnormality. KIDNEYS, URETERS AND BLADDER: No stones in the kidneys or ureters. No hydronephrosis. No perinephric or periureteral stranding. Urinary bladder is unremarkable. GI AND BOWEL: Stomach demonstrates no acute abnormality. There is no bowel obstruction. REPRODUCTIVE ORGANS: No acute abnormality. PERITONEUM AND RETROPERITONEUM: No ascites. No free air. VASCULATURE: Aorta is normal in caliber. ABDOMINAL AND PELVIS LYMPH NODES: No  lymphadenopathy. BONES AND SOFT TISSUES: Old bilateral rib fractures are noted. No acute osseous abnormality. No focal soft tissue abnormality. IMPRESSION: 1. Mild cardiomegaly. 2. Aortic atherosclerosis. 3. Left thyroid  nodule/mass measuring approximately 3 cm; recommend non-emergent thyroid  ultrasound. 4. Old bilateral rib fractures. 5. Minimal posterior basilar atelectasis. 6. Status post cholecystectomy. Electronically signed by: Lynwood Seip MD 06/24/2024 04:06 PM EST RP Workstation: [id number]   DG Chest Port 1 View Result Date: 06/24/2024 CLINICAL DATA:  Cough. EXAM: PORTABLE CHEST 1 VIEW COMPARISON:  Chest radiograph dated 06/07/2026. FINDINGS: Right-sided dialysis catheter in similar position. No focal consolidation, pleural effusion or pneumothorax. There is cardiomegaly with vascular congestion. No acute osseous pathology. IMPRESSION: Cardiomegaly with vascular congestion. No focal consolidation. Electronically Signed   By: Vanetta Chou M.D.   On: 06/24/2024 14:57   CT Head Wo Contrast Result Date: 06/24/2024 CLINICAL DATA:  Mental status change. EXAM: CT HEAD WITHOUT CONTRAST TECHNIQUE: Contiguous axial images were obtained from the base of the skull through the vertex without intravenous contrast. RADIATION DOSE REDUCTION: This exam was performed according to the departmental dose-optimization program which includes automated exposure control, adjustment of the mA and/or kV according to patient size and/or use of iterative reconstruction technique. COMPARISON:  06/07/2024 FINDINGS: Brain: Ventricles, cisterns and other CSF spaces are normal. Possible small old lacunar infarct over the anterior limb left internal capsule unchanged. 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 No mass, mass effect, shift of midline structures or acute hemorrhage. No evidence of acute infarction. Vascular: No hyperdense vessel or unexpected calcification. Skull: Normal. Negative for fracture or focal lesion.  Sinuses/Orbits: Visualized orbits are normal. Paranasal sinuses are clear. There is hypoplastic frontal sinuses. Other: None. IMPRESSION: 1. No acute findings. 2. 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 Possible small old lacunar infarct over the anterior limb left internal capsule unchanged. Electronically Signed   By: Toribio Agreste M.D.   On: 06/24/2024 14:09   Medications:  sodium chloride      anticoagulant sodium citrate       amLODipine   10 mg Oral Daily   aspirin  EC  81 mg Oral Daily   atorvastatin   80 mg Oral Daily   Chlorhexidine  Gluconate Cloth  6 each Topical Daily   fluticasone  furoate-vilanterol  1 puff Inhalation Daily   heparin   5,000 Units Subcutaneous Q8H   lactulose  20 g Oral BID   naphazoline-pheniramine  2 drop Both Eyes BID   pantoprazole   40 mg Oral Q1200   polyethylene glycol  17 g Oral BID   sodium chloride  flush  10-40 mL Intracatheter Q12H   sodium chloride  flush  3 mL Intravenous Q12H    Dialysis Orders:  MWF - [facility] 4hr, 500/A1.5, EDW 64.9kg, 3K/2.5Ca bath, TDC + AVF, heparin  2000 unit bolus - Mircera 60mcg IV q 2 weeks (last 10/27) - Calcitriol  1.42mcg PO q HD   Assessment/Plan:  AMS: hepatic encephalopathy vs uremia:  Missed 1 week of HD, but BUN not high enough to suspect uremia as sole  cause. Known cirrhosis with elevated ammonia. Lactulose started. Also with recent complaints of TDC pain - considering bacteremia. 24hr blood Cx negative. Improvement in alertness and confusion after HD yesterday.  ESRD:  Usual MWF schedule - last HD yesterday. HD for tomorrow. Hypertension/volume: BP high. Home BP meds restarted. Increase UF tomorrow.   Anemia: Hgb 11.6 - no ESA needed for now.  Metabolic bone disease: Ca ok, Phos binder.   Cirrhosis: Ammonia elevated. Lactulose. See above.   T2DM  Hx CVA  Parkinson's disease: Fiance reports baseline memory is good.Continued restlessness of BLE. Defer to neurology for follow up.   Hx substance  abuse: Fiance reports no recent use, cocaine  screen + as of last month.  Thyroid  Nodule: Noted on CT Chest/Abd/Pelvis to be 3cm. Pt unaware of this previous finding. Will need outpatient follow up.   Gerard Banks, PA-S2 06/25/2024, 9:20 AM  [facility]  Pt seen and examined with PA student as above. Agree with plan. Izetta Boehringer, PA-C [employer] Pager [phone]     Seen and examined independently.  Agree with note and exam as documented above by physician extender and as noted here.  Spoke with fiance at bedside.  He states that the patient wants to get her catheter out and start using her AVF.  We discussed the need to see VVS first for a second procedure.  I gave him the number to vein and vascular surgery.    Cocaine  positive. She is going to work on this and he is supportive of her  General adult female in bed in no acute distress HEENT normocephalic atraumatic extraocular movements intact sclera anicteric Neck supple trachea midline Lungs clear to auscultation bilaterally normal work of breathing at rest  Heart S1S2 no rub Abdomen soft nontender nondistended Extremities no edema  Psych normal mood and affect Neuro oriented to person, not to year. Oriented to location of hospital in [place] Access - RIJ tunn catheter; LUE AVF with bruit and thrill   AMS  - improved; s/p HD and correction of hypoglycemia  - lactulose per primary team discretion - hypoglycemia improved - hold sedating meds - cocaine  cessation    End-stage renal disease on hemodialysis  - HD per MWF schedule - She has tenderness at the catheter site but no exudate and no emergent findings noted on the CT chest abdomen pelvis that I can locate to explain her tenderness.  - If persistent may need to exchange the catheter    Substance abuse  - her fiance is supportive of her getting better    Spoke with the primary team this AM    Katheryn JAYSON Saba, MD 06/25/2024   11:59 AM

## 2024-06-25 NOTE — Progress Notes (Signed)
 Pt receives out-pt HD at [facility] on MWF 11:15 am chair time. Will assist as needed.   Randine Mungo Dialysis Navigator [phone]

## 2024-06-25 NOTE — Discharge Instructions (Signed)
 Counseling Resources:  [facility] [address]  [place], [place] [address]        [phone]     [facility]  [phone] [address] [place]  [place], [place] [address] [phone]  [facility] [address] [place], [place]  [phone]    Janit Griffins [facility] [address] [place], [place] [phone]  [facility]:  [phone] N[address] [place]  [facility]:  [address] [place], [place] [phone]  *[facility] (under & uninsured) [address]   [place] [phone]    [email or web address]    [facility] [place] -[address]     Phone:  [phone] [facility]-  [address]  [phone]    [facility] [address] [place], [place] [phone]  *[facility]: [address] [place], [place], [place]   [phone]   [facility] (Spanish therapist) [address] [place]  [address] [place] [address] [phone]    [facility]  [phone]  [address],  [place], [place]    [place] [phone] [address] [place]   [facility]  [address] [place], [place]        [phone]  Open Access/Walk In [place], [address] [place] [address] [place] [phone]):   Mon - Fri from 8 AM - 3 PM  [facility] the [address], [address] , [place]: [phone]) 8:30 - 12; 1 - 2:30 Accepts Medicaid   [facility] [employer], [address] [place], [place] [phone]):8:30 - 12; 2 - 3PM  [facility] [employer], [address] [place]; [phone]):   Mon - Fri 8 AM - 5 PM  *[facility] [address] [place]  MWF 12:30 to 3:00 or  call to schedule an appointment [phone]  Specific  Provider options Psychology Today  https://www.psychologytoday.com/us  click on find a therapist  enter your zip code left side and select or tailor a therapist for your specific need.   [facility] Provider Directory [email or web address]  (Medicaid)  Follow all drop down to find a provider  Social Support program [facility] [phone] or photosolver.pl [address], [place], [place] Recovery support and educational  In home counseling [facility] Telephone: [phone]  office in [facility] [email or web address]   private insurance [employer], [employer] health Choice, [place], [place], [place], [place], [place] Health Choice   24- Hour Availability:  [facility]  [phone] or [phone]  [facility] of the [facility] [phone]  [facility] Crisis Service  [phone]   [facility]  [phone] (after hours)  Therapeutic Alternative/Mobile Crisis   [phone]  [place]  National Suicide Hotline  434 296 3544 [place])  Call 911 or go to emergency room  [facility]  [phone]);  [place] and [place]  [phone]); [place], [place], [place], [place], [place], [place], [place]   In a time of Crisis: Integrated family Services/Therapeutic Alternatives.  Mobile Crisis Management provides immediate crisis response, 24/7.  Call [phone] Depending on the county. [facility] for MH/DD/SA [facility] is available 24 hours a day, 7 days a week. Customer Service Specialists will assist you to find a crisis provider that is well-matched with your needs. Your  local number is: [phone]  [facility] ([facility]) IOP, individual counseling, medication management [address] [place], [place] [address] [phone] Call for intake  hours; Medicaid and Uninsured    Outpatient Providers  [facility] ([facility]) Group and individual counseling. [address] [place], [address] [phone] [place]: [phone]  [place]: [phone]  [facility] Offers IOP groups multiple times per week. [address] [place], [address] [phone] Takes Medicaid and other insurances.   [facility] Outpatient  Chemical Dependency Intensive Outpatient Program (IOP) [address] [place], [place], [address] [phone]  [facility]  IOP and Partial Hospitalization Program  [address] [place], [address] [phone] Private Insurance, [place] only for partial hospitalization  [facility] [place] [address], [address], [place], [address] [phone]  [facility] [phone] [address] [facility]gent Care (BHUC) IOP, individual counseling, medication management [address] [place], [address] [phone] Medicaid and [facility]  [facility] [address], [place], [address] [phone] Private Insurance and Self Pay   [facility] [address] [place], [address] [phone]   [facility]one [facility]  [address], [place], [address]  [facility]  [address], [place], [address] [phone]  [facility]  [address], [place], [address] [phone]  [facility]  Locations in [place], [place], [place], [place].  [phone] Accepts all insurances and some uninsured.  [facility] Health Virtual Treatment [phone] Ages 54-64. Accepts most insurances and some uninsured.    Residential Treatment Programs  [facility] (Addiction [facility] [address] [place], [place]  [address] [phone] or [phone] Detox and Residential Rehab 21 days (Medicaid, private insurance, and self pay. If Medicare, will look into funding). No methadone. Call for pre-screen.   [facility]) [address]  [place], [place] [address] [phone] Detox 3-7 days (self Pay and Medicaid Limited). Transitional Program for females needs 60 days clean first.  Rehab Only for Males 60 days (Medicare, and Self Pay)-No methadone.  [facility] [address] [place], [place] [address] [phone] or [phone] Private Insurance only  [facility] PHONE: [phone] FAX: [phone] Residential program for women 21 and over for up to a year through a Christian 12-step recovery model. Self-pay.    [facility] [address] [place], [place] [address] Phone:  [phone] Must be detoxed 72 hours prior to admission; 28 day program.  Self-pay.  [facility] [address]  [place], [place] [phone] [employer], Medicare, [place] (not straight [place]). They offer assistance with transportation.   [facility] is a substance use disorder treatment program for women, including those who are pregnant, parenting, and/or those experiencing abuse and violence. [phone]  [facility] [address] [place], [place] [address] Women's: [phone] No Medicaid. No Methadone.   [facility]  [address] [place]  [place], [place] [address] [phone] Treatment Only, must make assessment appointment, and must be sober for assessment appointment. Self pay, [facility], must be [facility] resident. No methadone.   [facility]  [address] [place], [place] [address] [phone] No pending legal charges, Long-term work program. No methadone. Call for assessment.  [facility]  [address] [place], [place] [address] [phone]  or [phone] Commercial Insurance Only  [facility] - [phone] Private Insurance (no Medicaid). Males/Females, call to make referrals, multiple facilities.   [facility] [employer] Program: [employer].  [address] [place], [place] [address] [phone] Must be able to climb stairs. No benzos or narcotics.  [facility]-no methadone: [facility] [address] [place], [place] [address] [phone] (f)  [facility] [phone] [place], [place] For women, houses 8 residents for sober living. No Medicaid.   [facility] Locations across the [place] (mainly [place] ) willing to help with transportation.  [phone] [employer].     AA Meetings Meeting Locator:  potterybroker.com.br Also can download an app on that website.  Syringe Services Program Due to COVID-19, syringe services programs are likely operating under different hours with limited or no fixed site hours. Some programs may not be operating at all. Please contact the program directly using the phone numbers provided below to see if they are still operating under COVID-19. [facility] Solution to the Opioid Problem (GCSTOP) Fixed; mobile; peer-based [place] [phone] [email or web address] Fixed site exchange at [facility], [address] [place], [place] [address] on Wednesdays (2:00 - 5:00 pm) and Thursdays (4:00 - 8:00 pm). Pop-up mobile exchange locations: [employer], [address] [place], [place] [address] on Tuesdays (11:00 am - 1:00 pm) and Fridays (11:00 am - 1:00 pm) -[facility] - [address], [place], [place] [address] on Tuesdays (2:00 - 4:00 pm) and Fridays (2:00 - 4:00 pm) -[facility] -Fixed; mobile; peer-based [phone] [address], [place], [place] [address] Delivery and outreach available in [place] and [place], please call for more information. Monday, Tuesday: 1:00 -7:00 pm, Thursday: 4:00 pm - 8:00 pm, Friday: 1:00 pm - 8:00 pm) Medication-Assisted Treatment (MAT) -[facility]: Locations in [place], [place], [place], [place]. Accepts all insurances and some uninsured. Methadone, Suboxone , etc. Closed on weekends. Walk ins before 11am.  Call [phone]  -[facility]- services [place] and surrounding areas including [place], [place], [place], [place], [address], [address], [place], [place], [place], and [place], [place]. Options include Methadone, buprenorphine  or Suboxone . [address] [place], [place] [address] Phone: [phone] [place] - Fri: 5:30am - 2:00pm, Sat: 5:30am -7:30am, Sun: Closed  -[facility] [place]- We use FDA-approved medications, like methadone/suboxone /sublocade , and vivitrol. These medications are then combined with customized care plans that include individual or group counseling, toxicology, and medical care directed by on-site physicians. Accepts most insurance plans, Medicaid, and private pay.  [address] [place], [place] [address] Phone: [phone] Monday-Friday 5:00 AM - 10:00 AM, Saturday 6:00 AM - 8:30 AM, Sunday 6:00 AM - 7:00 AM  -[facility]- [facility] is a treatment & recovery focused program. In addition to receiving methadone medication, our clients participate in individual and group counseling as well as random drug testing. If accepted into the [facility] Opioid Program, you will be provided several intake appointments and a physical exam [address] [place], [place] [address] Office: [phone]  Fax: [phone]  -[facility]- We put our community members at the center of everything we do, for remote treatment services as well as in-person, from alcohol withdrawal to  opioid use and more.  [address], [place], [place] [address] [phone] Monday-Wednesday:  9:00am - 5:00pm, Thursday: 9:00am - 6:00pm, Friday: 9:00am - 5:00pm Saturday: 9:00am - 1:00pm, Sunday: Closed  -[facility]- Our clinic in [place], a medication unit, offers daily dosing of methadone or buprenorphine  in addition to our clinic in [place] offering counseling to help people overcome addiction to heroin and other opiates. We also offer psychiatric services including medication management, and an office based suboxone  program. [address], [place], [place] [address] Phone [phone]  [facility]-We treat Opioid Addiction using medications that are a combination of buprenorphine  and naloxone , which are used to treat adults addicted to narcotic painkillers and drugs such as heroin. They reduce the intense cravings and painful symptoms that accompany withdrawal. [address], [place], [place] Phone: [phone]  -[facility] (Or [place]): $12/daily for Methadone Treatment.  [address], [place], [place] [address] [phone]  [place] [phone] [address] [place], [place] [address]  -RTS: Suboxone  only.  [address] [place], [place] [address] P. [phone] -[facility] [address], [place], [place] [address] P. [phone]

## 2024-06-25 NOTE — TOC Initial Note (Incomplete Revision)
 Transition of Care [facility]) - Initial/Assessment Note    Patient Details  Name: Carol [name] MRN: [id number] Date of Birth: 10-31-1960  Transition of Care [facility]) CM/SW Contact:    Lendia Dais, LCSWA Phone Number: 06/25/2024, 9:52 AM  Clinical Narrative: CSW spoke to pt and pt's fiance Curtistine at bedside. Pt was able to answer some questions but was lethargic, Curtistine answered most questions. ' Pt is from home alone, partially independent with ADL's. Anothony states the pt typically needs assistance after HD with walking. Pt has DME of a cane, walker, inhaler and shower chair. Pt stated they would like to receive a scooter chair as DME. RNCM notified.  Pt has seen PCP in the last year and does not have a HCPOA. Pt stated that she wants Curtistine to make decisions for her  Pt is able to afford basic needs of food, medicine, and housing. Curtistine reported they are working on getting a car and the pt has transportation to HD through [facility]. Other trips are made by cabs/ubers. Pt's source of income in social security and disability.  Curtistine stated that the pt is pretty consistent with her medicine but occasionally will forget and has to remind the pt.   Pt has Hx of MH/SU. Curtistine stated that the pt has anxiety and takes medication for it. 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 CSW asked the pt if they would like resources for MH/SU and the pt was agreeable. Resources in AV[place].  No further TOC needs. Please place consult for TOC needs.      Expected Discharge Plan: Home/Self Care     Patient Goals and CMS Choice Patient states their goals for this hospitalization and ongoing recovery are:: To be able to cook more   Choice offered to / list presented to : NA      Expected Discharge Plan and Services In-house Referral: Clinical Social Work     Living arrangements for the past 2 months: Apartment                                       Prior Living Arrangements/Services Living arrangements for the past 2 months: Apartment Lives with:: Self   Do you feel safe going back to the place where you live?: Yes      Need for Family Participation in Patient Care: Yes (Comment) Care giver support system in place?: Yes (comment) Current home services: DME Criminal Activity/Legal Involvement Pertinent to Current Situation/Hospitalization: No - Comment as needed  Activities of Daily Living   ADL Screening (condition at time of admission) Independently performs ADLs?: Yes (appropriate for developmental age) Is the patient deaf or have difficulty hearing?: No Does the patient have difficulty seeing, even when wearing glasses/contacts?: No Does the patient have difficulty concentrating, remembering, or making decisions?: Yes  Permission Sought/Granted Permission sought to share information with : Family Supports Permission granted to share information with : Yes, Verbal Permission Granted  Share Information with NAME: Curtistine Pool     Permission granted to share info w Relationship: fiance  Permission granted to share info w Contact Information: [phone]  Emotional Assessment Appearance:: Appears stated age Attitude/Demeanor/Rapport: Lethargic Affect (typically observed): Pleasant, Restless Orientation: : Oriented to Self, Oriented to Place, Oriented to Situation Alcohol / Substance Use: Illicit Drugs Psych Involvement: No (comment)  Admission diagnosis:  Uremia [N19] Uremic encephalopathy [  G93.49, N19] Altered mental status, unspecified altered mental status type [R41.82] Patient Active Problem List   Diagnosis Date Noted   Uremia 06/24/2024   Bacteremia 01/10/2024   Cholecystitis 01/10/2024   Candidemia (HCC) 01/05/2024   Sepsis (HCC) 01/03/2024   Hypokalemia 08/11/2023   History of COPD 08/11/2023   History of anemia due to chronic kidney disease 08/11/2023   End stage renal disease (HCC)  08/03/2023   Overweight (BMI 25.0-29.9) 08/03/2023   Chest pain 07/31/2023   Hypertensive emergency 07/31/2023   Anemia of chronic disease 07/31/2023   Chronic pain 07/31/2023   Polysubstance abuse (HCC) 07/31/2023   COPD (chronic obstructive pulmonary disease) (HCC) 03/27/2023   Chronic anemia 03/27/2023   Acute on chronic systolic (congestive) heart failure (HCC) 03/08/2023   Volume overload 03/07/2023   Heme positive stool 11/06/2022   Anemia due to chronic blood loss 11/06/2022   Colon cancer screening 11/03/2022   Hypoglycemia 11/02/2022   MDD (major depressive disorder), recurrent episode, moderate (HCC) 11/02/2022   Abnormal barium swallow 11/02/2022   Esophageal stricture 11/02/2022   Esophageal dysmotility 11/02/2022   Antiplatelet or antithrombotic long-term use 11/02/2022   Chronic diastolic CHF (congestive heart failure) (HCC) 11/01/2022   Dysphagia 11/01/2022   Acute on chronic diastolic CHF (congestive heart failure) (HCC) 05/09/2022   Metabolic acidosis, increased anion gap 05/09/2022   Acute on chronic diastolic (congestive) heart failure (HCC) 03/22/2022   COPD not affecting current episode of care 03/22/2022   Class 2 obesity due to excess calories with body mass index (BMI) of 37.0 to 37.9 in adult 03/22/2022   Hypomagnesemia 03/15/2022   CHF (congestive heart failure) (HCC) 03/14/2022   Uremic encephalopathy 03/10/2022   borderline Prolonged QT interval 01/22/2022   Elevated troponin 01/22/2022   Thyroid  nodule 01/22/2022   Cocaine  use disorder, mild, abuse (HCC) 01/02/2022   Diabetic hypoglycemia (HCC) 12/31/2021   CKD (chronic kidney disease) stage 4, GFR 15-29 ml/min (HCC) 12/31/2021   CAD in native artery 12/24/2021   Anxiety with depression 12/24/2021   Pure hypercholesterolemia    DM2 (diabetes mellitus, type 2) (HCC) 01/29/2014   Essential hypertension 01/29/2014   Closed jaw fracture (HCC) 01/29/2014   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 [place] [address] Phone: [phone] Fax: [phone]  [id number] [place], [place] - [address] AT [facility] [address] [place] [address] Phone: [phone] Fax: [phone]     Social Drivers of Health (SDOH) Social History: SDOH Screenings   Food Insecurity: No Food Insecurity (01/03/2024)  Housing: Low Risk  (01/03/2024)  Transportation Needs: No Transportation Needs (01/03/2024)  Utilities: Not At Risk (01/03/2024)  Financial Resource Strain: Low Risk  (04/04/2022)  Tobacco Use: High Risk (06/24/2024)   SDOH Interventions:     Readmission Risk Interventions    01/14/2024    9:39 AM 08/02/2023    5:29 PM 03/25/2023    4:51 PM  Readmission Risk Prevention Plan  Transportation Screening Complete Complete Complete  Medication Review Oceanographer) Complete Complete Complete  PCP or Specialist appointment within 3-5 days of discharge Complete    HRI or Home Care Consult Complete Complete Complete  SW Recovery Care/Counseling Consult  Complete    Palliative Care Screening Not Applicable Not Applicable Not Applicable  Skilled Nursing Facility Not Applicable Not Applicable Not Applicable

## 2024-06-26 ENCOUNTER — Inpatient Hospital Stay (HOSPITAL_COMMUNITY)

## 2024-06-26 DIAGNOSIS — N19 Unspecified kidney failure: Secondary | ICD-10-CM | POA: Diagnosis not present

## 2024-06-26 DIAGNOSIS — R0609 Other forms of dyspnea: Secondary | ICD-10-CM | POA: Diagnosis not present

## 2024-06-26 HISTORY — PX: IR REMOVAL TUN CV CATH W/O FL: IMG2289

## 2024-06-26 LAB — BLOOD CULTURE ID PANEL (REFLEXED) - BCID2

## 2024-06-26 LAB — RENAL FUNCTION PANEL
Albumin: 2.2 g/dL — ABNORMAL LOW (ref 3.5–5.0)
Anion gap: 17 — ABNORMAL HIGH (ref 5–15)
BUN: 29 mg/dL — ABNORMAL HIGH (ref 8–23)
CO2: 24 mmol/L (ref 22–32)
Calcium: 8.4 mg/dL — ABNORMAL LOW (ref 8.9–10.3)
Chloride: 92 mmol/L — ABNORMAL LOW (ref 98–111)
Creatinine, Ser: 7.66 mg/dL — ABNORMAL HIGH (ref 0.44–1.00)
GFR, Estimated: 6 mL/min — ABNORMAL LOW (ref >60)
Glucose, Bld: 89 mg/dL (ref 70–99)
Phosphorus: 5.4 mg/dL — ABNORMAL HIGH (ref 2.5–4.6)
Potassium: 4.1 mmol/L (ref 3.5–5.1)
Sodium: 133 mmol/L — ABNORMAL LOW (ref 135–145)

## 2024-06-26 LAB — GLUCOSE, CAPILLARY
Glucose-Capillary: 87 mg/dL (ref 70–99)
Glucose-Capillary: 90 mg/dL (ref 70–99)
Glucose-Capillary: 115 mg/dL — ABNORMAL HIGH (ref 70–99)
Glucose-Capillary: 133 mg/dL — ABNORMAL HIGH (ref 70–99)

## 2024-06-26 LAB — ECHOCARDIOGRAM COMPLETE
AR max vel: 2.28 cm2
AV Peak grad: 8.5 mmHg
Ao pk vel: 1.46 m/s
Area-P 1/2: 3.91 cm2
Est EF: >75
Height: 66 in
S' Lateral: 2.1 cm
Weight: 2194.02 [oz_av]

## 2024-06-26 LAB — CBC
HCT: 33.4 % — ABNORMAL LOW (ref 36.0–46.0)
Hemoglobin: 11.1 g/dL — ABNORMAL LOW (ref 12.0–15.0)
MCH: 28.2 pg (ref 26.0–34.0)
MCHC: 33.2 g/dL (ref 30.0–36.0)
MCV: 84.8 fL (ref 80.0–100.0)
Platelets: 308 K/uL (ref 150–400)
RBC: 3.94 MIL/uL (ref 3.87–5.11)
RDW: 14.3 % (ref 11.5–15.5)
WBC: 6 K/uL (ref 4.0–10.5)
nRBC: 0 % (ref 0.0–0.2)

## 2024-06-26 MED ORDER — LIDOCAINE-EPINEPHRINE 1 %-1:100000 IJ SOLN
INTRAMUSCULAR | Status: AC
  Filled 2024-06-26: qty 1

## 2024-06-26 MED ORDER — HEPARIN SODIUM (PORCINE) 1000 UNIT/ML IJ SOLN
INTRAMUSCULAR | Status: AC
  Filled 2024-06-26: qty 4

## 2024-06-26 MED ORDER — HEPARIN SODIUM (PORCINE) 1000 UNIT/ML IJ SOLN
INTRAMUSCULAR | Status: AC
  Filled 2024-06-26: qty 2

## 2024-06-26 MED ORDER — OXYCODONE-ACETAMINOPHEN 5-325 MG PO TABS
1.0000 | ORAL_TABLET | ORAL | Status: DC | PRN
  Administered 2024-06-26 – 2024-06-27 (×5): 1 via ORAL
  Filled 2024-06-26 (×5): qty 1

## 2024-06-26 MED ORDER — VANCOMYCIN VARIABLE DOSE PER UNSTABLE RENAL FUNCTION (PHARMACIST DOSING): Status: DC

## 2024-06-26 MED ORDER — VANCOMYCIN HCL 750 MG/150ML IV SOLN
750.0000 mg | Freq: Once | INTRAVENOUS | Status: AC
  Administered 2024-06-26: 750 mg via INTRAVENOUS
  Filled 2024-06-26: qty 150

## 2024-06-26 MED ORDER — VANCOMYCIN HCL 1250 MG/250ML IV SOLN
1250.0000 mg | Freq: Once | INTRAVENOUS | Status: AC
  Administered 2024-06-26: 1250 mg via INTRAVENOUS
  Filled 2024-06-26: qty 250

## 2024-06-26 MED ORDER — HEPARIN SODIUM (PORCINE) 1000 UNIT/ML DIALYSIS
2000.0000 [IU] | Freq: Once | INTRAMUSCULAR | Status: AC
  Administered 2024-06-26: 2000 [IU] via INTRAVENOUS_CENTRAL

## 2024-06-26 MED ORDER — OXYCODONE-ACETAMINOPHEN 5-325 MG PO TABS
ORAL_TABLET | ORAL | Status: AC
  Filled 2024-06-26: qty 1

## 2024-06-26 NOTE — Progress Notes (Signed)
 Echo attempted at 1pm, patient is in dialysis. Will reattempt as schedule permits.  [facility] Pavneet Markwood RDCS

## 2024-06-26 NOTE — Plan of Care (Signed)
  Problem: Health Behavior/Discharge Planning: Goal: Ability to manage health-related needs will improve Outcome: Progressing   Problem: Clinical Measurements: Goal: Will remain free from infection Outcome: Progressing   Problem: Clinical Measurements: Goal: Cardiovascular complication will be avoided Outcome: Progressing   Problem: Activity: Goal: Risk for activity intolerance will decrease Outcome: Progressing   Problem: Nutrition: Goal: Adequate nutrition will be maintained Outcome: Progressing

## 2024-06-26 NOTE — Progress Notes (Signed)
 Pharmacy Antibiotic Note  Carol [name] is a 63 y.o. female started on Vancomycin  11/7 early am per Pharmacy consult for MRSE in 1/3 blood cultures.    Vancomycin  1250 mg IV given at 0721 this am, then went for HD x 3.5 hrs.  Estimated post-HD Vanc level ~15 mcg/ml, so will re-dose tonight. HD catheter removal today, then plan to repeat blood cultures on 11/8  Plan: Vancomycin  750 mg IV x 1 tonight, to boost level to ~25-30 mcg/ml Will follow up plans for HD access and next session.  Random Vanc level as needed, depending on HD plans. Target pre-dialysis Vanc level 15-25 mcg/ml. Follow up cultures.  Height: 5' 6 (167.6 cm) Weight: 62.2 kg (137 lb 2 oz) IBW/kg (Calculated) : 59.3  Temp (24hrs), Avg:98 F (36.7 C), Min:97.5 F (36.4 C), Max:98.5 F (36.9 C)  Recent Labs  Lab 06/24/24 1253 06/24/24 1303 06/24/24 1633 06/24/24 2233 06/25/24 0345 06/26/24 0906  WBC 6.5  --   --  5.8 6.6 6.0  CREATININE 11.95*  --   --  6.41* 6.69* 7.66*  LATICACIDVEN  --  0.9 0.8  --   --   --     Estimated Creatinine Clearance: 7.2 mL/min (A) (by C-G formula based on SCr of 7.66 mg/dL (H)).    Allergies  Allergen Reactions   Penicillins Shortness Of Breath and Other (See Comments)    Caused yeast infection   Ultram  [Tramadol ] Other (See Comments)    Made her tongue raw    Antimicrobials this admission: Vancomycin  11/7 >>  Microbiology results: 11/5 BICD: MRSE in 1/3 bottles 11/5 blood: GPC in aerobic bottle only - pending 11/5 blood: no growth x 2 days to date 11/5 MRSA PCR: negative 11/5 COVID, flu and RSV: negative  Thank you for allowing pharmacy to be a part of this patient's care.  Genaro Zebedee Calin, [place] 06/26/2024 3:44 PM

## 2024-06-26 NOTE — Progress Notes (Addendum)
 PHARMACY - PHYSICIAN COMMUNICATION CRITICAL VALUE ALERT - BLOOD CULTURE IDENTIFICATION (BCID)  Carol [name] is an 63 y.o. female who presented to [facility] on 06/24/2024 with a chief complaint of flu-like sx.  Assessment:  Admitted for uremic encephalopathy after missing HD, now growing MRSE in 1 of 3 blood cx bottles; pt remains afebrile and hypertensive, no leukocytosis >> likely a contaminant.  Name of physician contacted: [facility] MD  Current antibiotics: none  Changes to prescribed antibiotics recommended:  No changes needed unless clinical picture changes; if true infection is suspected would recommend vancomycin .  Results for orders placed or performed during the hospital encounter of 06/24/24  Blood Culture ID Panel (Reflexed) (Collected: 06/24/2024 11:41 AM)  Result Value Ref Range   Enterococcus faecalis NOT DETECTED NOT DETECTED   Enterococcus Faecium NOT DETECTED NOT DETECTED   Listeria monocytogenes NOT DETECTED NOT DETECTED   Staphylococcus species DETECTED (A) NOT DETECTED   Staphylococcus aureus (BCID) NOT DETECTED NOT DETECTED   Staphylococcus epidermidis DETECTED (A) NOT DETECTED   Staphylococcus lugdunensis NOT DETECTED NOT DETECTED   Streptococcus species NOT DETECTED NOT DETECTED   Streptococcus agalactiae NOT DETECTED NOT DETECTED   Streptococcus pneumoniae NOT DETECTED NOT DETECTED   Streptococcus pyogenes NOT DETECTED NOT DETECTED   A.calcoaceticus-baumannii NOT DETECTED NOT DETECTED   Bacteroides fragilis NOT DETECTED NOT DETECTED   Enterobacterales NOT DETECTED NOT DETECTED   Enterobacter cloacae complex NOT DETECTED NOT DETECTED   Escherichia coli NOT DETECTED NOT DETECTED   Klebsiella aerogenes NOT DETECTED NOT DETECTED   Klebsiella oxytoca NOT DETECTED NOT DETECTED   Klebsiella pneumoniae NOT DETECTED NOT DETECTED   Proteus species NOT DETECTED NOT DETECTED   Salmonella species NOT DETECTED NOT DETECTED   Serratia marcescens NOT DETECTED NOT  DETECTED   Haemophilus influenzae NOT DETECTED NOT DETECTED   Neisseria meningitidis NOT DETECTED NOT DETECTED   Pseudomonas aeruginosa NOT DETECTED NOT DETECTED   Stenotrophomonas maltophilia NOT DETECTED NOT DETECTED   Candida albicans NOT DETECTED NOT DETECTED   Candida auris NOT DETECTED NOT DETECTED   Candida glabrata NOT DETECTED NOT DETECTED   Candida krusei NOT DETECTED NOT DETECTED   Candida parapsilosis NOT DETECTED NOT DETECTED   Candida tropicalis NOT DETECTED NOT DETECTED   Cryptococcus neoformans/gattii NOT DETECTED NOT DETECTED   Methicillin resistance mecA/C DETECTED (A) NOT DETECTED    Marvetta Dauphin, PharmD, BCPS  06/26/2024  5:36 AM   Addendum: Covering provider would like to empirically start vancomycin ; will give vanc 1250mg  IV x1 and f/u whether to continue; plan for HD today. VB 5:40 AM

## 2024-06-26 NOTE — Progress Notes (Addendum)
 [facility] Progress Note   Subjective:  Pt was seen on HD this morning. UF 2.5L. Pt still restless and in pain at [facility]. Blood cultures positive for staph epidermis, will remove TDC after dialysis and get another set of cultures then. Denies CP, dyspnea, nausea, vomiting. Has some chills.   Objective Vitals:   06/26/24 0931 06/26/24 0950 06/26/24 1005 06/26/24 1035  BP:  (!) 152/95 (!) 150/100 138/86  Pulse: 77 77 80 84  Resp: 19 (!) 24 (!) 24 (!) 22  Temp:      TempSrc:      SpO2: 90% 100% 100% 100%  Weight:      Height:       Physical Exam General: Ill appearing woman in NAD. Room air. Heart:RRR no murmur Lungs: CTA anteriorly, no wheezing or rhonchi Abdomen: soft, non-tender, non-distended  Extremities: No BLE edema Dialysis Access: TDC in R chest - no exit site drainage, tender to palpation. LUE AVF +t/b but deep (1st stage BVT)   Additional Objective Labs: Basic Metabolic Panel: Recent Labs  Lab 06/24/24 1253 06/24/24 2233 06/25/24 0345 06/26/24 0906  NA 133*  --  132* 133*  K 4.1  --  3.4* 4.1  CL 93*  --  93* 92*  CO2 17*  --  23 24  GLUCOSE 51*  --  105* 89  BUN 67*  --  25* 29*  CREATININE 11.95* 6.41* 6.69* 7.66*  CALCIUM  8.8*  --  8.7* 8.4*  PHOS  --   --   --  5.4*   Liver Function Tests: Recent Labs  Lab 06/24/24 1253 06/26/24 0906  AST 32  --   ALT 14  --   ALKPHOS 162*  --   BILITOT 0.9  --   PROT 7.9  --   ALBUMIN  2.6* 2.2*    CBC: Recent Labs  Lab 06/24/24 1253 06/24/24 2233 06/25/24 0345 06/26/24 0906  WBC 6.5 5.8 6.6 6.0  NEUTROABS 3.6  --   --   --   HGB 12.2 13.9 11.6* 11.1*  HCT 36.7 41.0 34.6* 33.4*  MCV 84.8 83.3 83.0 84.8  PLT 288 297 280 308   Blood Culture    Component Value Date/Time   SDES BLOOD BLOOD RIGHT HAND 06/24/2024 1141   SPECREQUEST  06/24/2024 1141    BOTTLES DRAWN AEROBIC ONLY Blood Culture results may not be optimal due to an inadequate volume of blood received in culture bottles    CULT GRAM POSITIVE COCCI 06/24/2024 1141   REPTSTATUS PENDING 06/24/2024 1141    Cardiac Enzymes: Recent Labs  Lab 06/24/24 1358  CKTOTAL 249*   CBG: Recent Labs  Lab 06/25/24 1634 06/25/24 2032 06/25/24 2356 06/26/24 0405 06/26/24 0750  GLUCAP 99 76 153* 87 90    Studies/Results: CT CHEST ABDOMEN PELVIS W CONTRAST Result Date: 06/24/2024 EXAM: CT CHEST, ABDOMEN AND PELVIS WITH CONTRAST 06/24/2024 03:50:00 PM TECHNIQUE: CT of the chest, abdomen and pelvis was performed with the administration of 75 mL of iohexol  (OMNIPAQUE ) 350 MG/ML injection. Multiplanar reformatted images are provided for review. Automated exposure control, iterative reconstruction, and/or weight based adjustment of the mA/kV was utilized to reduce the radiation dose to as low as reasonably achievable. COMPARISON: None available. CLINICAL HISTORY: AMS FINDINGS: CHEST: MEDIASTINUM AND LYMPH NODES: Heart and pericardium demonstrate mild cardiomegaly. Aortic atherosclerosis is present. A 3 cm left thyroid  mass is noted. The central airways are clear. No mediastinal, hilar or axillary lymphadenopathy. LUNGS AND PLEURA: Minimal bilateral posterior  basilar septum atelectasis. No focal consolidation or pulmonary edema. No pleural effusion or pneumothorax. ABDOMEN AND PELVIS: LIVER: The liver is unremarkable. GALLBLADDER AND BILE DUCTS: Status post cholecystectomy. No biliary ductal dilatation. SPLEEN: No acute abnormality. PANCREAS: No acute abnormality. ADRENAL GLANDS: No acute abnormality. KIDNEYS, URETERS AND BLADDER: No stones in the kidneys or ureters. No hydronephrosis. No perinephric or periureteral stranding. Urinary bladder is unremarkable. GI AND BOWEL: Stomach demonstrates no acute abnormality. There is no bowel obstruction. REPRODUCTIVE ORGANS: No acute abnormality. PERITONEUM AND RETROPERITONEUM: No ascites. No free air. VASCULATURE: Aorta is normal in caliber. ABDOMINAL AND PELVIS LYMPH NODES: No lymphadenopathy.  BONES AND SOFT TISSUES: Old bilateral rib fractures are noted. No acute osseous abnormality. No focal soft tissue abnormality. IMPRESSION: 1. Mild cardiomegaly. 2. Aortic atherosclerosis. 3. Left thyroid  nodule/mass measuring approximately 3 cm; recommend non-emergent thyroid  ultrasound. 4. Old bilateral rib fractures. 5. Minimal posterior basilar atelectasis. 6. Status post cholecystectomy. Electronically signed by: Lynwood Seip MD 06/24/2024 04:06 PM EST RP Workstation: [id number]   DG Chest Port 1 View Result Date: 06/24/2024 CLINICAL DATA:  Cough. EXAM: PORTABLE CHEST 1 VIEW COMPARISON:  Chest radiograph dated 06/07/2026. FINDINGS: Right-sided dialysis catheter in similar position. No focal consolidation, pleural effusion or pneumothorax. There is cardiomegaly with vascular congestion. No acute osseous pathology. IMPRESSION: Cardiomegaly with vascular congestion. No focal consolidation. Electronically Signed   By: Vanetta Chou M.D.   On: 06/24/2024 14:57   CT Head Wo Contrast Result Date: 06/24/2024 CLINICAL DATA:  Mental status change. EXAM: CT HEAD WITHOUT CONTRAST TECHNIQUE: Contiguous axial images were obtained from the base of the skull through the vertex without intravenous contrast. RADIATION DOSE REDUCTION: This exam was performed according to the departmental dose-optimization program which includes automated exposure control, adjustment of the mA and/or kV according to patient size and/or use of iterative reconstruction technique. COMPARISON:  06/07/2024 FINDINGS: Brain: Ventricles, cisterns and other CSF spaces are normal. Possible small old lacunar infarct over the anterior limb left internal capsule unchanged. 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 No mass, mass effect, shift of midline structures or acute hemorrhage. No evidence of acute infarction. Vascular: No hyperdense vessel or unexpected calcification. Skull: Normal. Negative for fracture or focal lesion. Sinuses/Orbits:  Visualized orbits are normal. Paranasal sinuses are clear. There is hypoplastic frontal sinuses. Other: None. IMPRESSION: 1. No acute findings. 2. 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 Possible small old lacunar infarct over the anterior limb left internal capsule unchanged. Electronically Signed   By: Toribio Agreste M.D.   On: 06/24/2024 14:09   Medications:  anticoagulant sodium citrate       amLODipine   10 mg Oral Daily   aspirin  EC  81 mg Oral Daily   atorvastatin   80 mg Oral Daily   Chlorhexidine  Gluconate Cloth  6 each Topical Daily   fluticasone  furoate-vilanterol  1 puff Inhalation Daily   heparin   5,000 Units Subcutaneous Q8H   naphazoline-pheniramine  2 drop Both Eyes BID   pantoprazole   40 mg Oral Q1200   sodium chloride  flush  10-40 mL Intracatheter Q12H   sodium chloride  flush  3 mL Intravenous Q12H    Dialysis Orders:  MWF - [facility] 4hr, 500/A1.5, EDW 64.9kg, 3K/2.5Ca bath, TDC + AVF, heparin  2000 unit bolus - Mircera 60mcg IV q 2 weeks (last 10/27) - Calcitriol  1.50mcg PO q HD   Assessment/Plan:  AMS: hepatic encephalopathy vs uremia:  Missed 1 week of HD, but BUN not high enough to suspect uremia as sole cause. Known cirrhosis  with elevated ammonia. Lactulose started. Also with recent complaints of TDC pain - cultures positive for Staph Epidermis. IR consulted for catheter exchange and repeat of blood cultures. Echo pending.  ESRD:  Usual MWF schedule- HD today 2.5 UFG- tolerating well.  Hypertension/volume: BP okay today. Home BP meds restarted. Will continue to monitor with increased UF goal.   Anemia: Hgb 11.1 - no ESA needed for now.  Metabolic bone disease: Ca ok, Phos binder.   Cirrhosis: Ammonia elevated. Lactulose. See above.   T2DM  Hx CVA  Parkinson's disease: Fiance reports baseline memory is good.Continued restlessness of BLE. Defer to neurology for follow up.   Hx substance abuse: Fiance reports no recent use, cocaine  screen + as of  last month.  Thyroid  Nodule: Noted on CT Chest/Abd/Pelvis to be 3cm. Pt unaware of this previous finding. Will need outpatient follow up.    Gerard Banks, PA-S2 06/25/2024, 9:20 AM   [facility]   Pt seen and examined with PA student as above. Agree with plan. Ronnald Acosta, PA-C [employer] Pager [phone]     Seen and examined independently.  Agree with note and exam as documented above by physician extender and as noted here.  See my separate procedure note    Katheryn JAYSON Saba, MD 06/26/2024  4:02 PM

## 2024-06-26 NOTE — Progress Notes (Signed)
 [facility] PROGRESS NOTE    Progress Note  Carol [name]  FMW:[id number] DOB: [birth year] DOA: 06/24/2024 PCP: Dor[facility], [facility]     Brief Narrative:   Carol [name] is an 63 y.o. female past medical history diabetes mellitus essential hypertension, chronic diastolic dysfunction end-stage renal disease on hemodialysis, polysubstance abuse  (including cocaine  tobacco and alcohol), recently discharged from the hospital for sepsis secondary to fungemia and Burkholderia bacteremia who was brought in by her fianc for confusion on the day of admission.  He relates she has not had dialysis since 10/29 .  Assessment/Plan:   Staph epidermidis bacteremia leading to metabolic encephalopathy: Blood cultures growing Staph epidermidis, started empirically on IV vancomycin . Will need to remove dialysis catheter will defer to renal. Repeat surveillance blood cultures tomorrow once HD catheter is removed.  End stage renal disease Monday Wednesday Friday : Has not had dialysis since 06/17/2024. With pulmonary edema. Nephrology was consulted status post HD Continue further management per nephrology  DM2 (diabetes mellitus, type 2) (HCC) Hemoglobin A1c was 4.1  Essential hypertension Resume home Norvasc .  Elevated ammonia level: She was given 2 doses of lactulose. She has no evidence of cirrhosis on imaging.  Hypervolemic hyponatremia: Per dialysis.  Left thyroid  nodule: Follow-up PCP as an outpatient  Class 2 obesity due to excess calories with body mass index (BMI) of 37.0 to 37.9 in adult   DVT prophylaxis: lovenox  Family Communication:none Status is: Inpatient Remains inpatient appropriate because: Uremic encephalopathy    Code Status:     Code Status Orders  (From admission, onward)           Start     Ordered   06/24/24 1614  Full code  Continuous       Question:  By:  Answer:  Consent: discussion documented in EHR   06/24/24 1615            Code Status History     Date Active Date Inactive Code Status Order ID Comments User Context   01/03/2024 2031 01/16/2024 1540 Full Code [id number]  Arthea Child, MD ED   08/11/2023 0359 08/14/2023 1859 Full Code [id number]  Marcene Eva NOVAK, DO ED   07/31/2023 0838 08/05/2023 2111 Full Code [id number]  Claudene Maximino LABOR, MD ED   03/22/2023 1309 03/29/2023 1732 Full Code [id number]  Shona Terry SAILOR, DO ED   03/07/2023 2232 03/11/2023 2252 Full Code [id number]  Lonzell Emeline HERO, DO ED   11/01/2022 1031 11/07/2022 1847 Full Code [id number]  Waddell Rake, MD ED   07/22/2022 0031 07/22/2022 2059 Full Code [id number]  Waddell Rake, MD ED   05/09/2022 1010 05/11/2022 1758 Full Code [id number]  Leontine Elbe, DO ED   03/22/2022 1206 03/27/2022 1719 Full Code [id number]  Barbarann Nest, MD ED   03/10/2022 0636 03/17/2022 2253 Full Code [id number]  Alfornia Madison, MD ED   01/22/2022 1343 02/04/2022 1852 Full Code [id number]  Ricky Alfrieda DASEN, DO ED   12/31/2021 0204 01/03/2022 1745 Full Code [id number]  Laurence Locus, DO ED   12/20/2021 0459 12/24/2021 1952 Full Code [id number]  Marlin Lonni LABOR, MD ED   08/24/2014 1437 08/25/2014 0342 Full Code [id number]  Alona Corners, DO HOV   01/29/2014 2153 02/02/2014 1638 Full Code [id number]  Silvester Ales, MD Inpatient         IV Access:   Peripheral IV   Procedures and diagnostic studies:   CT CHEST ABDOMEN PELVIS W CONTRAST Result Date: 06/24/2024 EXAM: CT CHEST, ABDOMEN AND PELVIS WITH  CONTRAST 06/24/2024 03:50:00 PM TECHNIQUE: CT of the chest, abdomen and pelvis was performed with the administration of 75 mL of iohexol  (OMNIPAQUE ) 350 MG/ML injection. Multiplanar reformatted images are provided for review. Automated exposure control, iterative reconstruction, and/or weight based adjustment of the mA/kV was utilized to reduce the radiation dose to as low as reasonably achievable. COMPARISON: None available. CLINICAL HISTORY: AMS FINDINGS: CHEST: MEDIASTINUM AND LYMPH  NODES: Heart and pericardium demonstrate mild cardiomegaly. Aortic atherosclerosis is present. A 3 cm left thyroid  mass is noted. The central airways are clear. No mediastinal, hilar or axillary lymphadenopathy. LUNGS AND PLEURA: Minimal bilateral posterior basilar septum atelectasis. No focal consolidation or pulmonary edema. No pleural effusion or pneumothorax. ABDOMEN AND PELVIS: LIVER: The liver is unremarkable. GALLBLADDER AND BILE DUCTS: Status post cholecystectomy. No biliary ductal dilatation. SPLEEN: No acute abnormality. PANCREAS: No acute abnormality. ADRENAL GLANDS: No acute abnormality. KIDNEYS, URETERS AND BLADDER: No stones in the kidneys or ureters. No hydronephrosis. No perinephric or periureteral stranding. Urinary bladder is unremarkable. GI AND BOWEL: Stomach demonstrates no acute abnormality. There is no bowel obstruction. REPRODUCTIVE ORGANS: No acute abnormality. PERITONEUM AND RETROPERITONEUM: No ascites. No free air. VASCULATURE: Aorta is normal in caliber. ABDOMINAL AND PELVIS LYMPH NODES: No lymphadenopathy. BONES AND SOFT TISSUES: Old bilateral rib fractures are noted. No acute osseous abnormality. No focal soft tissue abnormality. IMPRESSION: 1. Mild cardiomegaly. 2. Aortic atherosclerosis. 3. Left thyroid  nodule/mass measuring approximately 3 cm; recommend non-emergent thyroid  ultrasound. 4. Old bilateral rib fractures. 5. Minimal posterior basilar atelectasis. 6. Status post cholecystectomy. Electronically signed by: Lynwood Seip MD 06/24/2024 04:06 PM EST RP Workstation: [id number]   DG Chest Port 1 View Result Date: 06/24/2024 CLINICAL DATA:  Cough. EXAM: PORTABLE CHEST 1 VIEW COMPARISON:  Chest radiograph dated 06/07/2026. FINDINGS: Right-sided dialysis catheter in similar position. No focal consolidation, pleural effusion or pneumothorax. There is cardiomegaly with vascular congestion. No acute osseous pathology. IMPRESSION: Cardiomegaly with vascular congestion. No focal  consolidation. Electronically Signed   By: Vanetta Chou M.D.   On: 06/24/2024 14:57   CT Head Wo Contrast Result Date: 06/24/2024 CLINICAL DATA:  Mental status change. EXAM: CT HEAD WITHOUT CONTRAST TECHNIQUE: Contiguous axial images were obtained from the base of the skull through the vertex without intravenous contrast. RADIATION DOSE REDUCTION: This exam was performed according to the departmental dose-optimization program which includes automated exposure control, adjustment of the mA and/or kV according to patient size and/or use of iterative reconstruction technique. COMPARISON:  06/07/2024 FINDINGS: Brain: Ventricles, cisterns and other CSF spaces are normal. Possible small old lacunar infarct over the anterior limb left internal capsule unchanged. 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 No mass, mass effect, shift of midline structures or acute hemorrhage. No evidence of acute infarction. Vascular: No hyperdense vessel or unexpected calcification. Skull: Normal. Negative for fracture or focal lesion. Sinuses/Orbits: Visualized orbits are normal. Paranasal sinuses are clear. There is hypoplastic frontal sinuses. Other: None. IMPRESSION: 1. No acute findings. 2. 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 Possible small old lacunar infarct over the anterior limb left internal capsule unchanged. Electronically Signed   By: Toribio Agreste M.D.   On: 06/24/2024 14:09     Medical Consultants:   None.   Subjective:    Carol [name] feels better no complaints.  Objective:    Vitals:   06/26/24 0851 06/26/24 0856 06/26/24 0907 06/26/24 0929  BP: (!) 152/73 (!) 149/71 (!) 156/82 120/66  Pulse: 75 73 78 79  Resp: (!) 21 17 20  (!)  25  Temp: 98.4 F (36.9 C)     TempSrc:      SpO2: 96% 95% 98% (!) 82%  Weight: 64.7 kg     Height:       SpO2: (!) 82 % O2 Flow Rate (L/min): 2 L/min   Intake/Output Summary (Last 24 hours) at 06/26/2024 0957 Last data filed  at 06/25/2024 2253 Gross per 24 hour  Intake 773.55 ml  Output 0 ml  Net 773.55 ml   Filed Weights   06/24/24 1103 06/24/24 2216 06/26/24 0851  Weight: 60 kg 62.2 kg 64.7 kg    Exam: General exam: In no acute distress. Respiratory system: Good air movement and clear to auscultation. Cardiovascular system: S1 & S2 heard, RRR. No JVD. Gastrointestinal system: Abdomen is nondistended, soft and nontender.  Extremities: No pedal edema. Skin: No rashes, lesions or ulcers Psychiatry: Judgement and insight appear normal. Mood & affect appropriate.    Data Reviewed:    Labs: Basic Metabolic Panel: Recent Labs  Lab 06/24/24 1253 06/24/24 2233 06/25/24 0345  NA 133*  --  132*  K 4.1  --  3.4*  CL 93*  --  93*  CO2 17*  --  23  GLUCOSE 51*  --  105*  BUN 67*  --  25*  CREATININE 11.95* 6.41* 6.69*  CALCIUM  8.8*  --  8.7*   GFR Estimated Creatinine Clearance: 8.3 mL/min (A) (by C-G formula based on SCr of 6.69 mg/dL (H)). Liver Function Tests: Recent Labs  Lab 06/24/24 1253  AST 32  ALT 14  ALKPHOS 162*  BILITOT 0.9  PROT 7.9  ALBUMIN  2.6*   No results for input(s): LIPASE, AMYLASE in the last 168 hours. Recent Labs  Lab 06/24/24 1355  AMMONIA 70*   Coagulation profile No results for input(s): INR, PROTIME in the last 168 hours. COVID-19 Labs  No results for input(s): DDIMER, FERRITIN, LDH, CRP in the last 72 hours.  Lab Results  Component Value Date   SARSCOV2NAA NEGATIVE 06/24/2024   SARSCOV2NAA NEGATIVE 06/07/2024   SARSCOV2NAA NEGATIVE 01/03/2024   SARSCOV2NAA NEGATIVE 11/07/2023    CBC: Recent Labs  Lab 06/24/24 1253 06/24/24 2233 06/25/24 0345  WBC 6.5 5.8 6.6  NEUTROABS 3.6  --   --   HGB 12.2 13.9 11.6*  HCT 36.7 41.0 34.6*  MCV 84.8 83.3 83.0  PLT 288 297 280   Cardiac Enzymes: Recent Labs  Lab 06/24/24 1358  CKTOTAL 249*   BNP (last 3 results) No results for input(s): PROBNP in the last 8760  hours. CBG: Recent Labs  Lab 06/25/24 1634 06/25/24 2032 06/25/24 2356 06/26/24 0405 06/26/24 0750  GLUCAP 99 76 153* 87 90   D-Dimer: No results for input(s): DDIMER in the last 72 hours. Hgb A1c: Recent Labs    06/24/24 2233  HGBA1C 4.7*   Lipid Profile: No results for input(s): CHOL, HDL, LDLCALC, TRIG, CHOLHDL, LDLDIRECT in the last 72 hours. Thyroid  function studies: No results for input(s): TSH, T4TOTAL, T3FREE, THYROIDAB in the last 72 hours.  Invalid input(s): FREET3 Anemia work up: No results for input(s): VITAMINB12, FOLATE, FERRITIN, TIBC, IRON , RETICCTPCT in the last 72 hours. Sepsis Labs: Recent Labs  Lab 06/24/24 1253 06/24/24 1303 06/24/24 1633 06/24/24 2233 06/25/24 0345  WBC 6.5  --   --  5.8 6.6  LATICACIDVEN  --  0.9 0.8  --   --    Microbiology Recent Results (from the past 240 hours)  Resp panel by RT-PCR (RSV, Flu A&B,  Covid) Anterior Nasal Swab     Status: None   Collection Time: 06/24/24 11:19 AM   Specimen: Anterior Nasal Swab  Result Value Ref Range Status   SARS Coronavirus 2 by RT PCR NEGATIVE NEGATIVE Final   Influenza A by PCR NEGATIVE NEGATIVE Final   Influenza B by PCR NEGATIVE NEGATIVE Final    Comment: (NOTE) The Xpert Xpress SARS-CoV-2/FLU/RSV plus assay is intended as an aid in the diagnosis of influenza from Nasopharyngeal swab specimens and should not be used as a sole basis for treatment. Nasal washings and aspirates are unacceptable for Xpert Xpress SARS-CoV-2/FLU/RSV testing.  Fact Sheet for Patients: bloggercourse.com  Fact Sheet for Healthcare Providers: seriousbroker.it  This test is not yet approved or cleared by the [place]  FDA and has been authorized for detection and/or diagnosis of SARS-CoV-2 by FDA under an Emergency Use Authorization (EUA). This EUA will remain in effect (meaning this test can be used) for the  duration of the COVID-19 declaration under Section 564(b)(1) of the Act, 21 U.S.C. section 360bbb-3(b)(1), unless the authorization is terminated or revoked.     Resp Syncytial Virus by PCR NEGATIVE NEGATIVE Final    Comment: (NOTE) Fact Sheet for Patients: bloggercourse.com  Fact Sheet for Healthcare Providers: seriousbroker.it  This test is not yet approved or cleared by the [place]  FDA and has been authorized for detection and/or diagnosis of SARS-CoV-2 by FDA under an Emergency Use Authorization (EUA). This EUA will remain in effect (meaning this test can be used) for the duration of the COVID-19 declaration under Section 564(b)(1) of the Act, 21 U.S.C. section 360bbb-3(b)(1), unless the authorization is terminated or revoked.  Performed at [facility], [address], [place], [place] [address]   Blood culture (routine x 2)     Status: None (Preliminary result)   Collection Time: 06/24/24 11:36 AM   Specimen: BLOOD  Result Value Ref Range Status   Specimen Description BLOOD RIGHT ANTECUBITAL  Final   Special Requests   Final    BOTTLES DRAWN AEROBIC ONLY Blood Culture results may not be optimal due to an inadequate volume of blood received in culture bottles   Culture   Final    NO GROWTH 2 DAYS Performed at [facility], [address], [place], [place] [address]    Report Status PENDING  Incomplete  Blood culture (routine x 2)     Status: None (Preliminary result)   Collection Time: 06/24/24 11:41 AM   Specimen: BLOOD  Result Value Ref Range Status   Specimen Description BLOOD BLOOD RIGHT HAND  Final   Special Requests   Final    BOTTLES DRAWN AEROBIC ONLY Blood Culture results may not be optimal due to an inadequate volume of blood received in culture bottles   Culture  Setup Time   Final    GRAM POSITIVE COCCI AEROBIC BOTTLE ONLY RBV V BRYK PHARMD 06/26/2024 @ 0514 BY DD Performed at [facility], [address] [place], [place] [address]    Culture GRAM POSITIVE COCCI  Final   Report Status PENDING  Incomplete  Blood Culture ID Panel (Reflexed)     Status: Abnormal   Collection Time: 06/24/24 11:41 AM  Result Value Ref Range Status   Enterococcus faecalis NOT DETECTED NOT DETECTED Final   Enterococcus Faecium NOT DETECTED NOT DETECTED Final   Listeria monocytogenes NOT DETECTED NOT DETECTED Final   Staphylococcus species DETECTED (A) NOT DETECTED Final    Comment:  CRITICAL RESULT CALLED TO, READ BACK BY AND VERIFIED WITH: RBV V BRYK PHARMD 06/26/2024 @ 0514 BY DD    Staphylococcus aureus (BCID) NOT DETECTED NOT DETECTED Final   Staphylococcus epidermidis DETECTED (A) NOT DETECTED Final    Comment: Methicillin (oxacillin) resistant coagulase negative staphylococcus. Possible blood culture contaminant (unless isolated from more than one blood culture draw or clinical case suggests pathogenicity). No antibiotic treatment is indicated for blood  culture contaminants. CRITICAL RESULT CALLED TO, READ BACK BY AND VERIFIED WITH: RBV V BRYK PHARMD 06/26/2024 @ 0514 BY DD    Staphylococcus lugdunensis NOT DETECTED NOT DETECTED Final   Streptococcus species NOT DETECTED NOT DETECTED Final   Streptococcus agalactiae NOT DETECTED NOT DETECTED Final   Streptococcus pneumoniae NOT DETECTED NOT DETECTED Final   Streptococcus pyogenes NOT DETECTED NOT DETECTED Final   A.calcoaceticus-baumannii NOT DETECTED NOT DETECTED Final   Bacteroides fragilis NOT DETECTED NOT DETECTED Final   Enterobacterales NOT DETECTED NOT DETECTED Final   Enterobacter cloacae complex NOT DETECTED NOT DETECTED Final   Escherichia coli NOT DETECTED NOT DETECTED Final   Klebsiella aerogenes NOT DETECTED NOT DETECTED Final   Klebsiella oxytoca NOT DETECTED NOT DETECTED Final   Klebsiella pneumoniae NOT DETECTED NOT DETECTED Final   Proteus species NOT DETECTED NOT DETECTED Final   Salmonella species NOT  DETECTED NOT DETECTED Final   Serratia marcescens NOT DETECTED NOT DETECTED Final   Haemophilus influenzae NOT DETECTED NOT DETECTED Final   Neisseria meningitidis NOT DETECTED NOT DETECTED Final   Pseudomonas aeruginosa NOT DETECTED NOT DETECTED Final   Stenotrophomonas maltophilia NOT DETECTED NOT DETECTED Final   Candida albicans NOT DETECTED NOT DETECTED Final   Candida auris NOT DETECTED NOT DETECTED Final   Candida glabrata NOT DETECTED NOT DETECTED Final   Candida krusei NOT DETECTED NOT DETECTED Final   Candida parapsilosis NOT DETECTED NOT DETECTED Final   Candida tropicalis NOT DETECTED NOT DETECTED Final   Cryptococcus neoformans/gattii NOT DETECTED NOT DETECTED Final   Methicillin resistance mecA/C DETECTED (A) NOT DETECTED Final    Comment: CRITICAL RESULT CALLED TO, READ BACK BY AND VERIFIED WITH: RBV V BRYK PHARMD 06/26/2024 @ 0514 BY DD Performed at [facility], [address] [place], [place] [address]   MRSA Next Gen by PCR, Nasal     Status: None   Collection Time: 06/24/24 10:39 PM   Specimen: Nasal Mucosa; Nasal Swab  Result Value Ref Range Status   MRSA by PCR Next Gen NOT DETECTED NOT DETECTED Final    Comment: (NOTE) The GeneXpert MRSA Assay (FDA approved for NASAL specimens only), is one component of a comprehensive MRSA colonization surveillance program. It is not intended to diagnose MRSA infection nor to guide or monitor treatment for MRSA infections. Test performance is not FDA approved in patients less than 62 years old. Performed at [facility], [address] [place], [place] [address]      Medications:    amLODipine   10 mg Oral Daily   aspirin  EC  81 mg Oral Daily   atorvastatin   80 mg Oral Daily   Chlorhexidine  Gluconate Cloth  6 each Topical Daily   fluticasone  furoate-vilanterol  1 puff Inhalation Daily   heparin   5,000 Units Subcutaneous Q8H   naphazoline-pheniramine  2 drop Both Eyes BID   pantoprazole   40 mg Oral Q1200    sodium chloride  flush  10-40 mL Intracatheter Q12H   sodium chloride  flush  3 mL Intravenous Q12H   Continuous  Infusions:  anticoagulant sodium citrate         LOS: 2 days   Erle Odell Castor  [facility]  06/26/2024, 9:57 AM

## 2024-06-26 NOTE — Progress Notes (Signed)
 Echocardiogram 2D Echocardiogram has been performed.  Carol [name] 06/26/2024, 5:42 PM

## 2024-06-26 NOTE — Procedures (Signed)
 Seen and examined on dialysis.  Blood pressure 156/82 and HR 79.  Procedure supervised.  Tolerating goal.  RIJ tunneled catheter in use  Full note to follow.  Noted bacteria in blood - staph species.  She has had considerable tenderness at the catheter site  Will remove catheter after HD today (she's currently on HD now/first shift).    Spoke with primary team about this plan  Abx per primary team - see that vanc is ordered  Will need repeat blood cultures after catheter removal to document clearance     Katheryn JAYSON Saba, MD 06/26/2024  9:30 AM

## 2024-06-27 ENCOUNTER — Inpatient Hospital Stay (HOSPITAL_COMMUNITY)

## 2024-06-27 DIAGNOSIS — B957 Other staphylococcus as the cause of diseases classified elsewhere: Secondary | ICD-10-CM

## 2024-06-27 DIAGNOSIS — N19 Unspecified kidney failure: Secondary | ICD-10-CM | POA: Diagnosis not present

## 2024-06-27 DIAGNOSIS — Z992 Dependence on renal dialysis: Secondary | ICD-10-CM

## 2024-06-27 DIAGNOSIS — R7881 Bacteremia: Secondary | ICD-10-CM

## 2024-06-27 DIAGNOSIS — N186 End stage renal disease: Secondary | ICD-10-CM | POA: Diagnosis not present

## 2024-06-27 DIAGNOSIS — G9340 Encephalopathy, unspecified: Secondary | ICD-10-CM

## 2024-06-27 LAB — CULTURE, BLOOD (ROUTINE X 2)

## 2024-06-27 LAB — GLUCOSE, CAPILLARY
Glucose-Capillary: 89 mg/dL (ref 70–99)
Glucose-Capillary: 93 mg/dL (ref 70–99)
Glucose-Capillary: 120 mg/dL — ABNORMAL HIGH (ref 70–99)
Glucose-Capillary: 120 mg/dL — ABNORMAL HIGH (ref 70–99)

## 2024-06-27 MED ORDER — OXYCODONE-ACETAMINOPHEN 5-325 MG PO TABS
1.0000 | ORAL_TABLET | ORAL | Status: DC | PRN
Start: 2024-06-27 — End: 2024-06-30
  Administered 2024-06-27: 2 via ORAL
  Administered 2024-06-28: 1 via ORAL
  Administered 2024-06-28 – 2024-06-29 (×2): 2 via ORAL
  Administered 2024-06-29: 1 via ORAL
  Administered 2024-06-30: 2 via ORAL
  Filled 2024-06-27 (×6): qty 2
  Filled 2024-06-27: qty 1

## 2024-06-27 NOTE — Progress Notes (Signed)
 [facility] Progress Note   Subjective:   Completed dialysis yesterday net UF 2.5L  Seen in room. Eating breakfast IR consulted for [facility] removal   Objective Vitals:   06/26/24 1538 06/26/24 2101 06/27/24 0426 06/27/24 0842  BP: (!) 151/78 (!) 144/77 (!) 147/66 (!) 164/86  Pulse: 89 75 75 85  Resp: 18 18 19 18   Temp: 98.3 F (36.8 C) 98.2 F (36.8 C) 98.6 F (37 C) (!) 97 F (36.1 C)  TempSrc:  Oral Oral   SpO2: 98% 98% 98% 98%  Weight:      Height:       Physical Exam General: Ill appearing woman in NAD. Room air. Heart:RRR no murmur Lungs: CTA anteriorly, no wheezing or rhonchi Abdomen: soft, non-tender, non-distended  Extremities: No BLE edema Dialysis Access: TDC in R chest - no exit site drainage, tender to palpation. LUE AVF +t/b but deep (1st stage BVT)   Additional Objective Labs: Basic Metabolic Panel: Recent Labs  Lab 06/24/24 1253 06/24/24 2233 06/25/24 0345 06/26/24 0906  NA 133*  --  132* 133*  K 4.1  --  3.4* 4.1  CL 93*  --  93* 92*  CO2 17*  --  23 24  GLUCOSE 51*  --  105* 89  BUN 67*  --  25* 29*  CREATININE 11.95* 6.41* 6.69* 7.66*  CALCIUM  8.8*  --  8.7* 8.4*  PHOS  --   --   --  5.4*   Liver Function Tests: Recent Labs  Lab 06/24/24 1253 06/26/24 0906  AST 32  --   ALT 14  --   ALKPHOS 162*  --   BILITOT 0.9  --   PROT 7.9  --   ALBUMIN  2.6* 2.2*    CBC: Recent Labs  Lab 06/24/24 1253 06/24/24 2233 06/25/24 0345 06/26/24 0906  WBC 6.5 5.8 6.6 6.0  NEUTROABS 3.6  --   --   --   HGB 12.2 13.9 11.6* 11.1*  HCT 36.7 41.0 34.6* 33.4*  MCV 84.8 83.3 83.0 84.8  PLT 288 297 280 308   Blood Culture    Component Value Date/Time   SDES BLOOD RIGHT ARM 06/26/2024 2045   SDES BLOOD RIGHT HAND 06/26/2024 2045   SPECREQUEST  06/26/2024 2045    BOTTLES DRAWN AEROBIC AND ANAEROBIC Blood Culture adequate volume   SPECREQUEST  06/26/2024 2045    BOTTLES DRAWN AEROBIC ONLY Blood Culture results may not be optimal  due to an inadequate volume of blood received in culture bottles   CULT  06/26/2024 2045    NO GROWTH < 12 HOURS Performed at [facility], [address] [place], [place] [address]    CULT  06/26/2024 2045    NO GROWTH < 12 HOURS Performed at [facility], [address] [place], [place] [address]    REPTSTATUS PENDING 06/26/2024 2045   REPTSTATUS PENDING 06/26/2024 2045    Cardiac Enzymes: Recent Labs  Lab 06/24/24 1358  CKTOTAL 249*   CBG: Recent Labs  Lab 06/26/24 0405 06/26/24 0750 06/26/24 1623 06/26/24 2103 06/27/24 0843  GLUCAP 87 90 115* 133* 89    Studies/Results: ECHOCARDIOGRAM COMPLETE Result Date: 06/26/2024    ECHOCARDIOGRAM REPORT   Patient Name:   NAOMIE BRIEN ESSEX Date of Exam: 06/26/2024 Medical Rec #:  [id number]               Height:       66.0 in Accession #:  [id number]              Weight:       137.1 lb Date of Birth:  [birth year]               BSA:          1.703 m Patient Age:    63 years                BP:           151/78 mmHg Patient Gender: F                       HR:           74 bpm. Exam Location:  Inpatient Procedure: 2D Echo, Cardiac Doppler and Color Doppler (Both Spectral and Color            Flow Doppler were utilized during procedure). Indications:    Dyspnea R06.00  History:        Patient has prior history of Echocardiogram examinations, most                 recent 01/05/2024. CHF, CAD, COPD and CKD, stage 4,                 Signs/Symptoms:Chest Pain; Risk Factors:Hypertension and                 Diabetes.  Sonographer:    Thea Norlander RCS Referring Phys: [phone] ABRAHAM FELIZ ORTIZ IMPRESSIONS  1. Systolic anterior motion of mitral valve. . Left ventricular ejection fraction, by estimation, is >75%. The left ventricle has hyperdynamic function. The left ventricle has no regional wall motion abnormalities. Left ventricular diastolic parameters were normal.  2. Right ventricular systolic function is normal. The right  ventricular size is normal. Tricuspid regurgitation signal is inadequate for assessing PA pressure.  3. The mitral valve is degenerative. No evidence of mitral valve regurgitation. No evidence of mitral stenosis.  4. The aortic valve is tricuspid. Aortic valve regurgitation is not visualized. No aortic stenosis is present.  5. The inferior vena cava is normal in size with greater than 50% respiratory variability, suggesting right atrial pressure of 3 mmHg. Comparison(s): A prior study was performed on 01/09/2024. LVEF 70-75%, mild biatrial enlargement, mild MR. FINDINGS  Left Ventricle: Systolic anterior motion of mitral valve. Left ventricular ejection fraction, by estimation, is >75%. The left ventricle has hyperdynamic function. The left ventricle has no regional wall motion abnormalities. The left ventricular internal cavity size was normal in size. There is borderline left ventricular hypertrophy of the basal-septal segment. Left ventricular diastolic parameters were normal. Right Ventricle: The right ventricular size is normal. No increase in right ventricular wall thickness. Right ventricular systolic function is normal. Tricuspid regurgitation signal is inadequate for assessing PA pressure. Left Atrium: Left atrial size was normal in size. Right Atrium: Right atrial size was normal in size. Pericardium: There is no evidence of pericardial effusion. Mitral Valve: The mitral valve is degenerative in appearance. No evidence of mitral valve regurgitation. No evidence of mitral valve stenosis. Tricuspid Valve: The tricuspid valve is grossly normal. Tricuspid valve regurgitation is trivial. No evidence of tricuspid stenosis. Aortic Valve: The aortic valve is tricuspid. Aortic valve regurgitation is not visualized. No aortic stenosis is present. Aortic valve peak gradient measures 8.5 mmHg. Pulmonic Valve: The pulmonic valve was not well visualized. Pulmonic valve regurgitation is not visualized. Aorta: The aortic  root and ascending aorta are  structurally normal, with no evidence of dilitation. Venous: The inferior vena cava is normal in size with greater than 50% respiratory variability, suggesting right atrial pressure of 3 mmHg. IAS/Shunts: The atrial septum is grossly normal.  LEFT VENTRICLE PLAX 2D LVIDd:         4.00 cm   Diastology LVIDs:         2.10 cm   LV e' medial:    6.06 cm/s LV PW:         0.90 cm   LV E/e' medial:  11.6 LV IVS:        0.70 cm   LV e' lateral:   4.79 cm/s LVOT diam:     2.00 cm   LV E/e' lateral: 14.7 LV SV:         57 LV SV Index:   33 LVOT Area:     3.14 cm  RIGHT VENTRICLE             IVC RV S prime:     14.70 cm/s  IVC diam: 1.20 cm TAPSE (M-mode): 1.8 cm LEFT ATRIUM             Index        RIGHT ATRIUM           Index LA diam:        3.70 cm 2.17 cm/m   RA Area:     15.90 cm LA Vol (A2C):   30.6 ml 17.96 ml/m  RA Volume:   41.80 ml  24.54 ml/m LA Vol (A4C):   21.6 ml 12.68 ml/m LA Biplane Vol: 25.8 ml 15.15 ml/m  AORTIC VALVE AV Area (Vmax): 2.28 cm AV Vmax:        146.00 cm/s AV Peak Grad:   8.5 mmHg LVOT Vmax:      106.00 cm/s LVOT Vmean:     69.700 cm/s LVOT VTI:       0.180 m  AORTA Ao Root diam: 3.00 cm Ao Asc diam:  3.10 cm MITRAL VALVE MV Area (PHT): 3.91 cm    SHUNTS MV Decel Time: 194 msec    Systemic VTI:  0.18 m MV E velocity: 70.20 cm/s  Systemic Diam: 2.00 cm MV A velocity: 96.40 cm/s MV E/A ratio:  0.73 Sunit Tolia Electronically signed by Madonna Large Signature Date/Time: 06/26/2024/6:01:36 PM    Final    IR Removal Tun Cv Cath W/O FL Result Date: 06/26/2024 INDICATION: History of ESRD requiring dialysis. Now with bacteremia. Request for tunneled dialysis catheter removal. EXAM: REMOVAL TUNNELED CENTRAL VENOUS CATHETER MEDICATIONS: 8 mL 1% lidocaine  ANESTHESIA/SEDATION: None FLUOROSCOPY: None COMPLICATIONS: None immediate. PROCEDURE: Informed written consent was obtained from the patient after a thorough discussion of the procedural risks, benefits and  alternatives. All questions were addressed. Maximal Sterile Barrier Technique was utilized including caps, mask, sterile gowns, sterile gloves, sterile drape, hand hygiene and skin antiseptic. A timeout was performed prior to the initiation of the procedure. The patient's right chest and catheter was prepped and draped in a normal sterile fashion. Heparin  was removed from both ports of catheter. 1% lidocaine  was used for local anesthesia. Using gentle blunt dissection the cuff of the catheter was exposed and the catheter was removed in it's entirety. Pressure was held till hemostasis was obtained. A sterile dressing was applied. The patient tolerated the procedure well with no immediate complications. IMPRESSION: Successful catheter removal as described above. Performed by: Wyatt Pommier, PA-C Electronically Signed   By: Juliene Philip HERO.D.  On: 06/26/2024 17:17   Medications:    amLODipine   10 mg Oral Daily   aspirin  EC  81 mg Oral Daily   atorvastatin   80 mg Oral Daily   Chlorhexidine  Gluconate Cloth  6 each Topical Daily   fluticasone  furoate-vilanterol  1 puff Inhalation Daily   heparin   5,000 Units Subcutaneous Q8H   naphazoline-pheniramine  2 drop Both Eyes BID   pantoprazole   40 mg Oral Q1200   sodium chloride  flush  10-40 mL Intracatheter Q12H   sodium chloride  flush  3 mL Intravenous Q12H   vancomycin  variable dose per unstable renal function (pharmacist dosing)   Does not apply See admin instructions    Dialysis Orders:  MWF - [facility] 4hr, 500/A1.5, EDW 64.9kg, 3K/2.5Ca bath, TDC + AVF, heparin  2000 unit bolus - Mircera 60mcg IV q 2 weeks (last 10/27) - Calcitriol  1.11mcg PO q HD   Assessment/Plan:  AMS: hepatic encephalopathy vs uremia:  Missed 1 week of HD prior to arrival, but BUN not high enough to suspect uremia as sole cause. Known cirrhosis with elevated ammonia. Lactulose started. Improving.   Bacteremia. Also with recent complaints of TDC tenderness. Blood cultures  positive for MRSE (1/2). Plan for line holiday  IR consulted for [facility] removal - TDC removed 11/7 Repeat blood cultures to document clearance. Pending IV Vancomycin  per primary  No vegetation on Echo  ESRD:  Usual MWF schedule. Next HD 11/10  Hypertension/volume: BP okay today. Home BP meds restarted. Will continue to monitor with increased UF goal.   Anemia: Hgb 11.1 - no ESA needed for now.  Metabolic bone disease: Ca ok, Phos binder.   Cirrhosis: Ammonia elevated. Lactulose. See above.   T2DM  Hx CVA  Parkinson's disease: Fiance reports baseline memory is good.Continued restlessness of BLE. Defer to neurology for follow up.   Hx substance abuse: Fiance reports no recent use, cocaine  screen + as of last month.   Maisie Ronnald Acosta PA-C [facility] 06/27/2024,9:04 AM

## 2024-06-27 NOTE — Consult Note (Signed)
 [facility]  Total days of antibiotics 2       Reason for Consult: staph epi bacteremia    Referring Physician: odell castor  Principal Problem:   Uremia Active Problems:   DM2 (diabetes mellitus, type 2) (HCC)   Essential hypertension   Uremic encephalopathy   Class 2 obesity due to excess calories with body mass index (BMI) of 37.0 to 37.9 in adult   End stage renal disease (HCC)   HPI: Carol [name] is a 63 y.o. female with history of T2DM, HTN, ESRD on HD who was admitted on 11/5 for confusion. In late may she was treated for burkholdaria bacteremia and fungemia. Her confusion was related to uremic syndrome/encephalopathy since she started to have worsening pain about her tunneled catheter and had not been to HD in the past week. In addition to confusion, she had subjective chills and myalgias. She was afebrile on admission, wbc of 6.5, hypertensive. Infectious work up revealed staph epi bacteremia in 1 of 4 bottles. She subsequently had hd line removed. Repeat blood cx on 11/7 are NGTD at 1 day. Her original cultures from 1/5 have only shown the staph epi. ID asked to weigh in on further work up and management  Patient reports only mild tenderness to right chest wall since removal of HD line. She is feeling better. Sleepy this morning.  Chest Ct imaging no signs of chest wall abscess, no infiltrates to be concerned for pneumonia. Did find incidental left thyroid  nodule. TTE no signs of vegetation.    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   Chronic back pain    Depression    Diabetes mellitus    ESRD (end stage renal disease) on dialysis [facility])    M,W,F   GERD (gastroesophageal reflux disease)    Heart murmur    Hypertension    NSTEMI (non-ST elevated myocardial infarction) (HCC) 12/20/2021   Palpitations    Parkinson's disease (HCC)      Allergies:  Allergies  Allergen Reactions   Penicillins Shortness Of Breath and Other (See Comments)    Caused yeast infection   Ultram  [Tramadol ] Other (See Comments)    Made her tongue raw    Current antibiotics:   MEDICATIONS:  amLODipine   10 mg Oral Daily   aspirin  EC  81 mg Oral Daily   atorvastatin   80 mg Oral Daily   Chlorhexidine  Gluconate Cloth  6 each Topical Daily   fluticasone  furoate-vilanterol  1 puff Inhalation Daily   heparin   5,000 Units Subcutaneous Q8H   naphazoline-pheniramine  2 drop Both Eyes BID   pantoprazole   40 mg Oral Q1200   sodium chloride  flush  10-40 mL Intracatheter Q12H   sodium chloride  flush  3 mL Intravenous Q12H   vancomycin  variable dose per unstable renal function (pharmacist dosing)   Does not apply See admin instructions    Social History   Tobacco Use   Smoking status: Some Days    Current packs/day: 0.25    Average packs/day: 0.3 packs/day for 3.0 years (0.8 ttl pk-yrs)    Types: Cigarettes    Passive exposure: Current   Smokeless tobacco: Never  Vaping Use   Vaping status: Never Used  Substance Use Topics   Alcohol use: Yes    Alcohol/week: 0.0 standard drinks of alcohol    Comment: occasional   Drug use:  Yes    Types: Cocaine     Family History  Problem Relation Age of Onset   Diabetes Mother    Kidney disease Mother    Seizures Father    Hypertension Father    Stroke Father    Asthma Other      Review of Systems  Constitutional: Negative for fever, chills, diaphoresis, activity change, appetite change, fatigue and unexpected weight change.  HENT: Negative for congestion, sore throat, rhinorrhea, sneezing, trouble swallowing and sinus pressure.  Eyes: Negative for photophobia and visual disturbance.  Respiratory: Negative for cough, chest tightness, shortness of breath, wheezing and stridor.  Cardiovascular: Negative for chest pain, palpitations and leg swelling.  Gastrointestinal: Negative for nausea,  vomiting, abdominal pain, diarrhea, constipation, blood in stool, abdominal distention and anal bleeding.  Genitourinary: Negative for dysuria, hematuria, flank pain and difficulty urinating.  Musculoskeletal: Negative for myalgias, back pain, joint swelling, arthralgias and gait problem.  Skin: Negative for color change, pallor, rash and wound.  Neurological: Negative for dizziness, tremors, weakness and light-headedness.  Hematological: Negative for adenopathy. Does not bruise/bleed easily.  Psychiatric/Behavioral: Negative for behavioral problems, confusion, sleep disturbance, dysphoric mood, decreased concentration and agitation.    OBJECTIVE: Temp:  [97 F (36.1 C)-98.6 F (37 C)] 97 F (36.1 C) (11/08 0842) Pulse Rate:  [69-89] 69 (11/08 0915) Resp:  [18-19] 18 (11/08 0915) BP: (144-164)/(66-86) 164/86 (11/08 0842) SpO2:  [95 %-98 %] 95 % (11/08 0915) Physical Exam  Constitutional:  oriented to person, place, and time. appears well-developed and well-nourished. No distress.  HENT: [place]/AT, PERRLA, no scleral icterus Mouth/Throat: Oropharynx is clear and moist. No oropharyngeal exudate.  Cardiovascular: Normal rate, regular rhythm and normal heart sounds. Exam reveals no gallop and no friction rub.  No murmur heard.  Pulmonary/Chest: Effort normal and breath sounds normal. No respiratory distress.  has no wheezes.  Neck = supple, no nuchal rigidity Abdominal: Soft. Bowel sounds are normal.  exhibits no distension. There is no tenderness.  Lymphadenopathy: no cervical adenopathy. No axillary adenopathy Neurological: alert and oriented to person, place, and time.  Skin: Skin is warm and dry. No rash noted. No erythema.  Psychiatric: a normal mood and affect.  behavior is normal.    LABS: Results for orders placed or performed during the hospital encounter of 06/24/24 (from the past 48 hours)  Glucose, capillary     Status: Abnormal   Collection Time: 06/25/24  2:12 PM  Result  Value Ref Range   Glucose-Capillary 121 (H) 70 - 99 mg/dL    Comment: Glucose reference range applies only to samples taken after fasting for at least 8 hours.  Glucose, capillary     Status: None   Collection Time: 06/25/24  4:34 PM  Result Value Ref Range   Glucose-Capillary 99 70 - 99 mg/dL    Comment: Glucose reference range applies only to samples taken after fasting for at least 8 hours.  Glucose, capillary     Status: None   Collection Time: 06/25/24  8:32 PM  Result Value Ref Range   Glucose-Capillary 76 70 - 99 mg/dL    Comment: Glucose reference range applies only to samples taken after fasting for at least 8 hours.  Glucose, capillary     Status: Abnormal   Collection Time: 06/25/24 11:56 PM  Result Value Ref Range   Glucose-Capillary 153 (H) 70 - 99 mg/dL    Comment: Glucose reference range applies only to samples taken after fasting for at least 8 hours.  Comment 1 Notify RN    Comment 2 Document in Chart   Glucose, capillary     Status: None   Collection Time: 06/26/24  4:05 AM  Result Value Ref Range   Glucose-Capillary 87 70 - 99 mg/dL    Comment: Glucose reference range applies only to samples taken after fasting for at least 8 hours.  Glucose, capillary     Status: None   Collection Time: 06/26/24  7:50 AM  Result Value Ref Range   Glucose-Capillary 90 70 - 99 mg/dL    Comment: Glucose reference range applies only to samples taken after fasting for at least 8 hours.  Renal function panel     Status: Abnormal   Collection Time: 06/26/24  9:06 AM  Result Value Ref Range   Sodium 133 (L) 135 - 145 mmol/L   Potassium 4.1 3.5 - 5.1 mmol/L   Chloride 92 (L) 98 - 111 mmol/L   CO2 24 22 - 32 mmol/L   Glucose, Bld 89 70 - 99 mg/dL    Comment: Glucose reference range applies only to samples taken after fasting for at least 8 hours.   BUN 29 (H) 8 - 23 mg/dL   Creatinine, Ser 2.33 (H) 0.44 - 1.00 mg/dL   Calcium  8.4 (L) 8.9 - 10.3 mg/dL   Phosphorus 5.4 (H) 2.5 - 4.6  mg/dL   Albumin  2.2 (L) 3.5 - 5.0 g/dL   GFR, Estimated 6 (L) >60 mL/min    Comment: (NOTE) Calculated using the CKD-EPI Creatinine Equation (2021)    Anion gap 17 (H) 5 - 15    Comment: Performed at [facility], [address] [place], [place] [address]  CBC     Status: Abnormal   Collection Time: 06/26/24  9:06 AM  Result Value Ref Range   WBC 6.0 4.0 - 10.5 K/uL   RBC 3.94 3.87 - 5.11 MIL/uL   Hemoglobin 11.1 (L) 12.0 - 15.0 g/dL   HCT 66.5 (L) 63.9 - 53.9 %   MCV 84.8 80.0 - 100.0 fL   MCH 28.2 26.0 - 34.0 pg   MCHC 33.2 30.0 - 36.0 g/dL   RDW 85.6 88.4 - 84.4 %   Platelets 308 150 - 400 K/uL   nRBC 0.0 0.0 - 0.2 %    Comment: Performed at [facility], [address], [place], [place] [address]  Glucose, capillary     Status: Abnormal   Collection Time: 06/26/24  4:23 PM  Result Value Ref Range   Glucose-Capillary 115 (H) 70 - 99 mg/dL    Comment: Glucose reference range applies only to samples taken after fasting for at least 8 hours.  Culture, blood (Routine X 2) w Reflex to ID Panel     Status: None (Preliminary result)   Collection Time: 06/26/24  8:45 PM   Specimen: BLOOD RIGHT ARM  Result Value Ref Range   Specimen Description BLOOD RIGHT ARM    Special Requests      BOTTLES DRAWN AEROBIC AND ANAEROBIC Blood Culture adequate volume   Culture      NO GROWTH < 12 HOURS Performed at [facility], [address], [place], [place] [address]    Report Status PENDING   Culture, blood (Routine X 2) w Reflex to ID Panel     Status: None (Preliminary result)   Collection Time: 06/26/24  8:45 PM   Specimen: BLOOD RIGHT HAND  Result Value Ref Range   Specimen Description BLOOD  RIGHT HAND    Special Requests      BOTTLES DRAWN AEROBIC ONLY Blood Culture results may not be optimal due to an inadequate volume of blood received in culture bottles   Culture      NO GROWTH < 12 HOURS Performed at [facility], [address], [place], [place]  [address]    Report Status PENDING   Glucose, capillary     Status: Abnormal   Collection Time: 06/26/24  9:03 PM  Result Value Ref Range   Glucose-Capillary 133 (H) 70 - 99 mg/dL    Comment: Glucose reference range applies only to samples taken after fasting for at least 8 hours.   Comment 1 Notify RN    Comment 2 Document in Chart   Glucose, capillary     Status: None   Collection Time: 06/27/24  8:43 AM  Result Value Ref Range   Glucose-Capillary 89 70 - 99 mg/dL    Comment: Glucose reference range applies only to samples taken after fasting for at least 8 hours.  Glucose, capillary     Status: None   Collection Time: 06/27/24 12:35 PM  Result Value Ref Range   Glucose-Capillary 93 70 - 99 mg/dL    Comment: Glucose reference range applies only to samples taken after fasting for at least 8 hours.    MICRO: 11/7 blood cx pending 11/5 blood cx staph epi IMAGING: ECHOCARDIOGRAM COMPLETE Result Date: 06/26/2024    ECHOCARDIOGRAM REPORT   Patient Name:   YUE FLANIGAN Date of Exam: 06/26/2024 Medical Rec #:  [id number]               Height:       66.0 in Accession #:    [id number]              Weight:       137.1 lb Date of Birth:  1961/07/26               BSA:          1.703 m Patient Age:    63 years                BP:           151/78 mmHg Patient Gender: F                       HR:           74 bpm. Exam Location:  Inpatient Procedure: 2D Echo, Cardiac Doppler and Color Doppler (Both Spectral and Color            Flow Doppler were utilized during procedure). Indications:    Dyspnea R06.00  History:        Patient has prior history of Echocardiogram examinations, most                 recent 01/05/2024. CHF, CAD, COPD and CKD, stage 4,                 Signs/Symptoms:Chest Pain; Risk Factors:Hypertension and                 Diabetes.  Sonographer:    Thea Norlander RCS Referring Phys: [phone] ABRAHAM FELIZ ORTIZ IMPRESSIONS  1. Systolic anterior motion of mitral valve. . Left ventricular  ejection fraction, by estimation, is >75%. The left ventricle has hyperdynamic function. The left ventricle has no regional wall motion abnormalities. Left ventricular diastolic parameters were normal.  2. Right ventricular systolic function is normal. The right ventricular size is normal. Tricuspid regurgitation signal is inadequate for assessing PA pressure.  3. The mitral valve is degenerative. No evidence of mitral valve regurgitation. No evidence of mitral stenosis.  4. The aortic valve is tricuspid. Aortic valve regurgitation is not visualized. No aortic stenosis is present.  5. The inferior vena cava is normal in size with greater than 50% respiratory variability, suggesting right atrial pressure of 3 mmHg. Comparison(s): A prior study was performed on 01/09/2024. LVEF 70-75%, mild biatrial enlargement, mild MR. FINDINGS  Left Ventricle: Systolic anterior motion of mitral valve. Left ventricular ejection fraction, by estimation, is >75%. The left ventricle has hyperdynamic function. The left ventricle has no regional wall motion abnormalities. The left ventricular internal cavity size was normal in size. There is borderline left ventricular hypertrophy of the basal-septal segment. Left ventricular diastolic parameters were normal. Right Ventricle: The right ventricular size is normal. No increase in right ventricular wall thickness. Right ventricular systolic function is normal. Tricuspid regurgitation signal is inadequate for assessing PA pressure. Left Atrium: Left atrial size was normal in size. Right Atrium: Right atrial size was normal in size. Pericardium: There is no evidence of pericardial effusion. Mitral Valve: The mitral valve is degenerative in appearance. No evidence of mitral valve regurgitation. No evidence of mitral valve stenosis. Tricuspid Valve: The tricuspid valve is grossly normal. Tricuspid valve regurgitation is trivial. No evidence of tricuspid stenosis. Aortic Valve: The aortic valve  is tricuspid. Aortic valve regurgitation is not visualized. No aortic stenosis is present. Aortic valve peak gradient measures 8.5 mmHg. Pulmonic Valve: The pulmonic valve was not well visualized. Pulmonic valve regurgitation is not visualized. Aorta: The aortic root and ascending aorta are structurally normal, with no evidence of dilitation. Venous: The inferior vena cava is normal in size with greater than 50% respiratory variability, suggesting right atrial pressure of 3 mmHg. IAS/Shunts: The atrial septum is grossly normal.  LEFT VENTRICLE PLAX 2D LVIDd:         4.00 cm   Diastology LVIDs:         2.10 cm   LV e' medial:    6.06 cm/s LV PW:         0.90 cm   LV E/e' medial:  11.6 LV IVS:        0.70 cm   LV e' lateral:   4.79 cm/s LVOT diam:     2.00 cm   LV E/e' lateral: 14.7 LV SV:         57 LV SV Index:   33 LVOT Area:     3.14 cm  RIGHT VENTRICLE             IVC RV S prime:     14.70 cm/s  IVC diam: 1.20 cm TAPSE (M-mode): 1.8 cm LEFT ATRIUM             Index        RIGHT ATRIUM           Index LA diam:        3.70 cm 2.17 cm/m   RA Area:     15.90 cm LA Vol (A2C):   30.6 ml 17.96 ml/m  RA Volume:   41.80 ml  24.54 ml/m LA Vol (A4C):   21.6 ml 12.68 ml/m LA Biplane Vol: 25.8 ml 15.15 ml/m  AORTIC VALVE AV Area (Vmax): 2.28 cm AV Vmax:        146.00 cm/s AV  Peak Grad:   8.5 mmHg LVOT Vmax:      106.00 cm/s LVOT Vmean:     69.700 cm/s LVOT VTI:       0.180 m  AORTA Ao Root diam: 3.00 cm Ao Asc diam:  3.10 cm MITRAL VALVE MV Area (PHT): 3.91 cm    SHUNTS MV Decel Time: 194 msec    Systemic VTI:  0.18 m MV E velocity: 70.20 cm/s  Systemic Diam: 2.00 cm MV A velocity: 96.40 cm/s MV E/A ratio:  0.73 Sunit Tolia Electronically signed by Madonna Large Signature Date/Time: 06/26/2024/6:01:36 PM    Final    IR Removal Tun Cv Cath W/O FL Result Date: 06/26/2024 INDICATION: History of ESRD requiring dialysis. Now with bacteremia. Request for tunneled dialysis catheter removal. EXAM: REMOVAL TUNNELED CENTRAL  VENOUS CATHETER MEDICATIONS: 8 mL 1% lidocaine  ANESTHESIA/SEDATION: None FLUOROSCOPY: None COMPLICATIONS: None immediate. PROCEDURE: Informed written consent was obtained from the patient after a thorough discussion of the procedural risks, benefits and alternatives. All questions were addressed. Maximal Sterile Barrier Technique was utilized including caps, mask, sterile gowns, sterile gloves, sterile drape, hand hygiene and skin antiseptic. A timeout was performed prior to the initiation of the procedure. The patient's right chest and catheter was prepped and draped in a normal sterile fashion. Heparin  was removed from both ports of catheter. 1% lidocaine  was used for local anesthesia. Using gentle blunt dissection the cuff of the catheter was exposed and the catheter was removed in it's entirety. Pressure was held till hemostasis was obtained. A sterile dressing was applied. The patient tolerated the procedure well with no immediate complications. IMPRESSION: Successful catheter removal as described above. Performed by: Wyatt Pommier, PA-C Electronically Signed   By: Juliene Balder M.D.   On: 06/26/2024 17:17    HISTORICAL MICRO/IMAGING  Assessment/Plan:  63yo F with ESRD on HD started p/w uremic encephalopathy for missing HD in the setting of pain about her HD line. Line removed. Found to have low grade staph epi bacteremia. - continue with vancomycin . Anticipate to treat for 2 wk using 11/7 as day 1  - will continue to follow repeat blood cx. Should they turn positive, then we will need TEE as imaging of spine to see if there are any other nidus of infection  Esrd on hd = return back onto hd schedule  Encephalopathy = appears improving since having HD.  evaluation of this patient requires complex antimicrobial therapy evaluation and counseling and isolation needs for disease transmission risk assessment and mitigation.   Montie FURY Luiz MD MPH [facility] [phone]

## 2024-06-27 NOTE — Progress Notes (Addendum)
 [facility] PROGRESS NOTE    Progress Note  Carol [name]  FMW:[id number] DOB: 1960-10-10 DOA: 06/24/2024 PCP: [facility], [facility]     Brief Narrative:   Carol [name] is an 63 y.o. female past medical history diabetes mellitus essential hypertension, chronic diastolic dysfunction end-stage renal disease on hemodialysis, polysubstance abuse  (including cocaine  tobacco and alcohol), recently discharged from the hospital for sepsis secondary to fungemia and Burkholderia bacteremia who was brought in by her fianc for confusion on the day of admission.  He relates she has not had dialysis since 10/29 .  Assessment/Plan:   Staph epidermidis bacteremia leading to metabolic encephalopathy: Blood cultures growing Staph epidermidis 1/2. Continue on IV vancomycin . Surveillance blood cultures negative till date 2D echo EF of 75% no regional wall motion abnormality, valves show no evidence of vegetation. Consult ID. HD cath has been removed.  End stage renal disease Monday Wednesday Friday : Did not had dialysis since 06/17/2024. With pulmonary edema. HD catheter removed on 06/26/2024. Nephrology was consulted status post HD Continue further management per nephrology  Right shoulder pain: Same side where her catheter was placed. She cannot raise her arm above her head. Check an MRI of the shoulder.  DM2 (diabetes mellitus, type 2) (HCC) Hemoglobin A1c was 4.1  Essential hypertension Resume home Norvasc .  Elevated ammonia level: She was given 2 doses of lactulose. She has no evidence of cirrhosis on imaging.  Hypervolemic hyponatremia: Per dialysis.  Left thyroid  nodule: Follow-up PCP as an outpatient  Class 2 obesity due to excess calories with body mass index (BMI) of 37.0 to 37.9 in adult   DVT prophylaxis: lovenox  Family Communication:none Status is: Inpatient Remains inpatient appropriate because: Uremic encephalopathy    Code Status:      Code Status Orders  (From admission, onward)           Start     Ordered   06/24/24 1614  Full code  Continuous       Question:  By:  Answer:  Consent: discussion documented in EHR   06/24/24 1615           Code Status History     Date Active Date Inactive Code Status Order ID Comments User Context   01/03/2024 2031 01/16/2024 1540 Full Code [id number]  Arthea Child, MD ED   08/11/2023 0359 08/14/2023 1859 Full Code [id number]  Marcene Eva NOVAK, DO ED   07/31/2023 0838 08/05/2023 2111 Full Code [id number]  Claudene Maximino LABOR, MD ED   03/22/2023 1309 03/29/2023 1732 Full Code [id number]  Shona Terry SAILOR, DO ED   03/07/2023 2232 03/11/2023 2252 Full Code [id number]  Lonzell Emeline HERO, DO ED   11/01/2022 1031 11/07/2022 1847 Full Code [id number]  Waddell Rake, MD ED   07/22/2022 0031 07/22/2022 2059 Full Code [id number]  Waddell Rake, MD ED   05/09/2022 1010 05/11/2022 1758 Full Code [id number]  Leontine Elbe, DO ED   03/22/2022 1206 03/27/2022 1719 Full Code [id number]  Barbarann Nest, MD ED   03/10/2022 0636 03/17/2022 2253 Full Code [id number]  Alfornia Madison, MD ED   01/22/2022 1343 02/04/2022 1852 Full Code [id number]  Ricky Alfrieda DASEN, DO ED   12/31/2021 0204 01/03/2022 1745 Full Code [id number]  Laurence Locus, DO ED   12/20/2021 0459 12/24/2021 1952 Full Code [id number]  Marlin Lonni LABOR, MD ED   08/24/2014 1437 08/25/2014 0342 Full Code [id number]  Alona Corners, DO HOV   01/29/2014 2153 02/02/2014 1638 Full Code [id number]  Doutova, Anastassia, MD  Inpatient         IV Access:   Peripheral IV   Procedures and diagnostic studies:   ECHOCARDIOGRAM COMPLETE Result Date: 06/26/2024    ECHOCARDIOGRAM REPORT   Patient Name:   Carol [name] Date of Exam: 06/26/2024 Medical Rec #:  [id number]               Height:       66.0 in Accession #:    [id number]              Weight:       137.1 lb Date of Birth:  04-30-1961               BSA:          1.703 m Patient Age:    63 years                BP:            151/78 mmHg Patient Gender: F                       HR:           74 bpm. Exam Location:  Inpatient Procedure: 2D Echo, Cardiac Doppler and Color Doppler (Both Spectral and Color            Flow Doppler were utilized during procedure). Indications:    Dyspnea R06.00  History:        Patient has prior history of Echocardiogram examinations, most                 recent 01/05/2024. CHF, CAD, COPD and CKD, stage 4,                 Signs/Symptoms:Chest Pain; Risk Factors:Hypertension and                 Diabetes.  Sonographer:    Thea Norlander RCS Referring Phys: [phone] Tereasa Yilmaz FELIZ ORTIZ IMPRESSIONS  1. Systolic anterior motion of mitral valve. . Left ventricular ejection fraction, by estimation, is >75%. The left ventricle has hyperdynamic function. The left ventricle has no regional wall motion abnormalities. Left ventricular diastolic parameters were normal.  2. Right ventricular systolic function is normal. The right ventricular size is normal. Tricuspid regurgitation signal is inadequate for assessing PA pressure.  3. The mitral valve is degenerative. No evidence of mitral valve regurgitation. No evidence of mitral stenosis.  4. The aortic valve is tricuspid. Aortic valve regurgitation is not visualized. No aortic stenosis is present.  5. The inferior vena cava is normal in size with greater than 50% respiratory variability, suggesting right atrial pressure of 3 mmHg. Comparison(s): A prior study was performed on 01/09/2024. LVEF 70-75%, mild biatrial enlargement, mild MR. FINDINGS  Left Ventricle: Systolic anterior motion of mitral valve. Left ventricular ejection fraction, by estimation, is >75%. The left ventricle has hyperdynamic function. The left ventricle has no regional wall motion abnormalities. The left ventricular internal cavity size was normal in size. There is borderline left ventricular hypertrophy of the basal-septal segment. Left ventricular diastolic parameters were normal. Right Ventricle:  The right ventricular size is normal. No increase in right ventricular wall thickness. Right ventricular systolic function is normal. Tricuspid regurgitation signal is inadequate for assessing PA pressure. Left Atrium: Left atrial size was normal in size. Right Atrium: Right atrial size was normal in size. Pericardium: There is no evidence of pericardial effusion. Mitral Valve: The mitral valve is degenerative in  appearance. No evidence of mitral valve regurgitation. No evidence of mitral valve stenosis. Tricuspid Valve: The tricuspid valve is grossly normal. Tricuspid valve regurgitation is trivial. No evidence of tricuspid stenosis. Aortic Valve: The aortic valve is tricuspid. Aortic valve regurgitation is not visualized. No aortic stenosis is present. Aortic valve peak gradient measures 8.5 mmHg. Pulmonic Valve: The pulmonic valve was not well visualized. Pulmonic valve regurgitation is not visualized. Aorta: The aortic root and ascending aorta are structurally normal, with no evidence of dilitation. Venous: The inferior vena cava is normal in size with greater than 50% respiratory variability, suggesting right atrial pressure of 3 mmHg. IAS/Shunts: The atrial septum is grossly normal.  LEFT VENTRICLE PLAX 2D LVIDd:         4.00 cm   Diastology LVIDs:         2.10 cm   LV e' medial:    6.06 cm/s LV PW:         0.90 cm   LV E/e' medial:  11.6 LV IVS:        0.70 cm   LV e' lateral:   4.79 cm/s LVOT diam:     2.00 cm   LV E/e' lateral: 14.7 LV SV:         57 LV SV Index:   33 LVOT Area:     3.14 cm  RIGHT VENTRICLE             IVC RV S prime:     14.70 cm/s  IVC diam: 1.20 cm TAPSE (M-mode): 1.8 cm LEFT ATRIUM             Index        RIGHT ATRIUM           Index LA diam:        3.70 cm 2.17 cm/m   RA Area:     15.90 cm LA Vol (A2C):   30.6 ml 17.96 ml/m  RA Volume:   41.80 ml  24.54 ml/m LA Vol (A4C):   21.6 ml 12.68 ml/m LA Biplane Vol: 25.8 ml 15.15 ml/m  AORTIC VALVE AV Area (Vmax): 2.28 cm AV Vmax:         146.00 cm/s AV Peak Grad:   8.5 mmHg LVOT Vmax:      106.00 cm/s LVOT Vmean:     69.700 cm/s LVOT VTI:       0.180 m  AORTA Ao Root diam: 3.00 cm Ao Asc diam:  3.10 cm MITRAL VALVE MV Area (PHT): 3.91 cm    SHUNTS MV Decel Time: 194 msec    Systemic VTI:  0.18 m MV E velocity: 70.20 cm/s  Systemic Diam: 2.00 cm MV A velocity: 96.40 cm/s MV E/A ratio:  0.73 Sunit Tolia Electronically signed by Madonna Large Signature Date/Time: 06/26/2024/6:01:36 PM    Final    IR Removal Tun Cv Cath W/O FL Result Date: 06/26/2024 INDICATION: History of ESRD requiring dialysis. Now with bacteremia. Request for tunneled dialysis catheter removal. EXAM: REMOVAL TUNNELED CENTRAL VENOUS CATHETER MEDICATIONS: 8 mL 1% lidocaine  ANESTHESIA/SEDATION: None FLUOROSCOPY: None COMPLICATIONS: None immediate. PROCEDURE: Informed written consent was obtained from the patient after a thorough discussion of the procedural risks, benefits and alternatives. All questions were addressed. Maximal Sterile Barrier Technique was utilized including caps, mask, sterile gowns, sterile gloves, sterile drape, hand hygiene and skin antiseptic. A timeout was performed prior to the initiation of the procedure. The patient's right chest and catheter was prepped and draped in a normal  sterile fashion. Heparin  was removed from both ports of catheter. 1% lidocaine  was used for local anesthesia. Using gentle blunt dissection the cuff of the catheter was exposed and the catheter was removed in it's entirety. Pressure was held till hemostasis was obtained. A sterile dressing was applied. The patient tolerated the procedure well with no immediate complications. IMPRESSION: Successful catheter removal as described above. Performed by: Wyatt Pommier, PA-C Electronically Signed   By: Juliene Balder M.D.   On: 06/26/2024 17:17     Medical Consultants:   None.   Subjective:    Carol [name] complaining of right shoulder pain  Objective:    Vitals:    06/26/24 2101 06/27/24 0426 06/27/24 0842 06/27/24 0915  BP: (!) 144/77 (!) 147/66 (!) 164/86   Pulse: 75 75 85 69  Resp: 18 19 18 18   Temp: 98.2 F (36.8 C) 98.6 F (37 C) (!) 97 F (36.1 C)   TempSrc: Oral Oral    SpO2: 98% 98% 98% 95%  Weight:      Height:       SpO2: 95 % O2 Flow Rate (L/min): 2 L/min   Intake/Output Summary (Last 24 hours) at 06/27/2024 0933 Last data filed at 06/27/2024 0600 Gross per 24 hour  Intake 506.78 ml  Output 2500 ml  Net -1993.22 ml   Filed Weights   06/24/24 2216 06/26/24 0851 06/26/24 1252  Weight: 62.2 kg 64.7 kg 62.2 kg    Exam: General exam: In no acute distress. Respiratory system: Good air movement and clear to auscultation. Cardiovascular system: S1 & S2 heard, RRR. No JVD. Gastrointestinal system: Abdomen is nondistended, soft and nontender.  Extremities: No pedal edema. Skin: No rashes, lesions or ulcers Psychiatry: Judgement and insight appear normal. Mood & affect appropriate.  Data Reviewed:    Labs: Basic Metabolic Panel: Recent Labs  Lab 06/24/24 1253 06/24/24 2233 06/25/24 0345 06/26/24 0906  NA 133*  --  132* 133*  K 4.1  --  3.4* 4.1  CL 93*  --  93* 92*  CO2 17*  --  23 24  GLUCOSE 51*  --  105* 89  BUN 67*  --  25* 29*  CREATININE 11.95* 6.41* 6.69* 7.66*  CALCIUM  8.8*  --  8.7* 8.4*  PHOS  --   --   --  5.4*   GFR Estimated Creatinine Clearance: 7.2 mL/min (A) (by C-G formula based on SCr of 7.66 mg/dL (H)). Liver Function Tests: Recent Labs  Lab 06/24/24 1253 06/26/24 0906  AST 32  --   ALT 14  --   ALKPHOS 162*  --   BILITOT 0.9  --   PROT 7.9  --   ALBUMIN  2.6* 2.2*   No results for input(s): LIPASE, AMYLASE in the last 168 hours. Recent Labs  Lab 06/24/24 1355  AMMONIA 70*   Coagulation profile No results for input(s): INR, PROTIME in the last 168 hours. COVID-19 Labs  No results for input(s): DDIMER, FERRITIN, LDH, CRP in the last 72 hours.  Lab Results   Component Value Date   SARSCOV2NAA NEGATIVE 06/24/2024   SARSCOV2NAA NEGATIVE 06/07/2024   SARSCOV2NAA NEGATIVE 01/03/2024   SARSCOV2NAA NEGATIVE 11/07/2023    CBC: Recent Labs  Lab 06/24/24 1253 06/24/24 2233 06/25/24 0345 06/26/24 0906  WBC 6.5 5.8 6.6 6.0  NEUTROABS 3.6  --   --   --   HGB 12.2 13.9 11.6* 11.1*  HCT 36.7 41.0 34.6* 33.4*  MCV 84.8 83.3 83.0 84.8  PLT  288 297 280 308   Cardiac Enzymes: Recent Labs  Lab 06/24/24 1358  CKTOTAL 249*   BNP (last 3 results) No results for input(s): PROBNP in the last 8760 hours. CBG: Recent Labs  Lab 06/26/24 0405 06/26/24 0750 06/26/24 1623 06/26/24 2103 06/27/24 0843  GLUCAP 87 90 115* 133* 89   D-Dimer: No results for input(s): DDIMER in the last 72 hours. Hgb A1c: Recent Labs    06/24/24 2233  HGBA1C 4.7*   Lipid Profile: No results for input(s): CHOL, HDL, LDLCALC, TRIG, CHOLHDL, LDLDIRECT in the last 72 hours. Thyroid  function studies: No results for input(s): TSH, T4TOTAL, T3FREE, THYROIDAB in the last 72 hours.  Invalid input(s): FREET3 Anemia work up: No results for input(s): VITAMINB12, FOLATE, FERRITIN, TIBC, IRON , RETICCTPCT in the last 72 hours. Sepsis Labs: Recent Labs  Lab 06/24/24 1253 06/24/24 1303 06/24/24 1633 06/24/24 2233 06/25/24 0345 06/26/24 0906  WBC 6.5  --   --  5.8 6.6 6.0  LATICACIDVEN  --  0.9 0.8  --   --   --    Microbiology Recent Results (from the past 240 hours)  Resp panel by RT-PCR (RSV, Flu A&B, Covid) Anterior Nasal Swab     Status: None   Collection Time: 06/24/24 11:19 AM   Specimen: Anterior Nasal Swab  Result Value Ref Range Status   SARS Coronavirus 2 by RT PCR NEGATIVE NEGATIVE Final   Influenza A by PCR NEGATIVE NEGATIVE Final   Influenza B by PCR NEGATIVE NEGATIVE Final    Comment: (NOTE) The Xpert Xpress SARS-CoV-2/FLU/RSV plus assay is intended as an aid in the diagnosis of influenza from Nasopharyngeal  swab specimens and should not be used as a sole basis for treatment. Nasal washings and aspirates are unacceptable for Xpert Xpress SARS-CoV-2/FLU/RSV testing.  Fact Sheet for Patients: bloggercourse.com  Fact Sheet for Healthcare Providers: seriousbroker.it  This test is not yet approved or cleared by the [place]  FDA and has been authorized for detection and/or diagnosis of SARS-CoV-2 by FDA under an Emergency Use Authorization (EUA). This EUA will remain in effect (meaning this test can be used) for the duration of the COVID-19 declaration under Section 564(b)(1) of the Act, 21 U.S.C. section 360bbb-3(b)(1), unless the authorization is terminated or revoked.     Resp Syncytial Virus by PCR NEGATIVE NEGATIVE Final    Comment: (NOTE) Fact Sheet for Patients: bloggercourse.com  Fact Sheet for Healthcare Providers: seriousbroker.it  This test is not yet approved or cleared by the [place]  FDA and has been authorized for detection and/or diagnosis of SARS-CoV-2 by FDA under an Emergency Use Authorization (EUA). This EUA will remain in effect (meaning this test can be used) for the duration of the COVID-19 declaration under Section 564(b)(1) of the Act, 21 U.S.C. section 360bbb-3(b)(1), unless the authorization is terminated or revoked.  Performed at [facility], [address] [place], [place] [address]   Blood culture (routine x 2)     Status: None (Preliminary result)   Collection Time: 06/24/24 11:36 AM   Specimen: BLOOD  Result Value Ref Range Status   Specimen Description BLOOD RIGHT ANTECUBITAL  Final   Special Requests   Final    BOTTLES DRAWN AEROBIC ONLY Blood Culture results may not be optimal due to an inadequate volume of blood received in culture bottles   Culture   Final    NO GROWTH 3 DAYS Performed at [facility], [address], [place], [place] [address]  Report Status PENDING  Incomplete  Blood culture (routine x 2)     Status: None (Preliminary result)   Collection Time: 06/24/24 11:41 AM   Specimen: BLOOD  Result Value Ref Range Status   Specimen Description BLOOD BLOOD RIGHT HAND  Final   Special Requests   Final    BOTTLES DRAWN AEROBIC ONLY Blood Culture results may not be optimal due to an inadequate volume of blood received in culture bottles   Culture  Setup Time   Final    GRAM POSITIVE COCCI AEROBIC BOTTLE ONLY RBV V BRYK PHARMD 06/26/2024 @ 0514 BY DD Performed at [facility], [address] [place], [place] [address]    Culture GRAM POSITIVE COCCI  Final   Report Status PENDING  Incomplete  Blood Culture ID Panel (Reflexed)     Status: Abnormal   Collection Time: 06/24/24 11:41 AM  Result Value Ref Range Status   Enterococcus faecalis NOT DETECTED NOT DETECTED Final   Enterococcus Faecium NOT DETECTED NOT DETECTED Final   Listeria monocytogenes NOT DETECTED NOT DETECTED Final   Staphylococcus species DETECTED (A) NOT DETECTED Final    Comment: CRITICAL RESULT CALLED TO, READ BACK BY AND VERIFIED WITH: RBV V BRYK PHARMD 06/26/2024 @ 0514 BY DD    Staphylococcus aureus (BCID) NOT DETECTED NOT DETECTED Final   Staphylococcus epidermidis DETECTED (A) NOT DETECTED Final    Comment: Methicillin (oxacillin) resistant coagulase negative staphylococcus. Possible blood culture contaminant (unless isolated from more than one blood culture draw or clinical case suggests pathogenicity). No antibiotic treatment is indicated for blood  culture contaminants. CRITICAL RESULT CALLED TO, READ BACK BY AND VERIFIED WITH: RBV V BRYK PHARMD 06/26/2024 @ 0514 BY DD    Staphylococcus lugdunensis NOT DETECTED NOT DETECTED Final   Streptococcus species NOT DETECTED NOT DETECTED Final   Streptococcus agalactiae NOT DETECTED NOT DETECTED Final   Streptococcus pneumoniae NOT DETECTED NOT DETECTED Final    Streptococcus pyogenes NOT DETECTED NOT DETECTED Final   A.calcoaceticus-baumannii NOT DETECTED NOT DETECTED Final   Bacteroides fragilis NOT DETECTED NOT DETECTED Final   Enterobacterales NOT DETECTED NOT DETECTED Final   Enterobacter cloacae complex NOT DETECTED NOT DETECTED Final   Escherichia coli NOT DETECTED NOT DETECTED Final   Klebsiella aerogenes NOT DETECTED NOT DETECTED Final   Klebsiella oxytoca NOT DETECTED NOT DETECTED Final   Klebsiella pneumoniae NOT DETECTED NOT DETECTED Final   Proteus species NOT DETECTED NOT DETECTED Final   Salmonella species NOT DETECTED NOT DETECTED Final   Serratia marcescens NOT DETECTED NOT DETECTED Final   Haemophilus influenzae NOT DETECTED NOT DETECTED Final   Neisseria meningitidis NOT DETECTED NOT DETECTED Final   Pseudomonas aeruginosa NOT DETECTED NOT DETECTED Final   Stenotrophomonas maltophilia NOT DETECTED NOT DETECTED Final   Candida albicans NOT DETECTED NOT DETECTED Final   Candida auris NOT DETECTED NOT DETECTED Final   Candida glabrata NOT DETECTED NOT DETECTED Final   Candida krusei NOT DETECTED NOT DETECTED Final   Candida parapsilosis NOT DETECTED NOT DETECTED Final   Candida tropicalis NOT DETECTED NOT DETECTED Final   Cryptococcus neoformans/gattii NOT DETECTED NOT DETECTED Final   Methicillin resistance mecA/C DETECTED (A) NOT DETECTED Final    Comment: CRITICAL RESULT CALLED TO, READ BACK BY AND VERIFIED WITH: RBV V BRYK PHARMD 06/26/2024 @ 0514 BY DD Performed at [facility], [address], [place], [place] [address]   MRSA Next Gen by PCR, Nasal     Status: None  Collection Time: 06/24/24 10:39 PM   Specimen: Nasal Mucosa; Nasal Swab  Result Value Ref Range Status   MRSA by PCR Next Gen NOT DETECTED NOT DETECTED Final    Comment: (NOTE) The GeneXpert MRSA Assay (FDA approved for NASAL specimens only), is one component of a comprehensive MRSA colonization surveillance program. It is not intended to diagnose  MRSA infection nor to guide or monitor treatment for MRSA infections. Test performance is not FDA approved in patients less than 53 years old. Performed at [facility], [address], [place], [place] [address]   Culture, blood (Routine X 2) w Reflex to ID Panel     Status: None (Preliminary result)   Collection Time: 06/26/24  8:45 PM   Specimen: BLOOD RIGHT ARM  Result Value Ref Range Status   Specimen Description BLOOD RIGHT ARM  Final   Special Requests   Final    BOTTLES DRAWN AEROBIC AND ANAEROBIC Blood Culture adequate volume   Culture   Final    NO GROWTH < 12 HOURS Performed at [facility], [address] [place], [place] [address]    Report Status PENDING  Incomplete  Culture, blood (Routine X 2) w Reflex to ID Panel     Status: None (Preliminary result)   Collection Time: 06/26/24  8:45 PM   Specimen: BLOOD RIGHT HAND  Result Value Ref Range Status   Specimen Description BLOOD RIGHT HAND  Final   Special Requests   Final    BOTTLES DRAWN AEROBIC ONLY Blood Culture results may not be optimal due to an inadequate volume of blood received in culture bottles   Culture   Final    NO GROWTH < 12 HOURS Performed at [facility], [address], [place], [place] [address]    Report Status PENDING  Incomplete     Medications:    amLODipine   10 mg Oral Daily   aspirin  EC  81 mg Oral Daily   atorvastatin   80 mg Oral Daily   Chlorhexidine  Gluconate Cloth  6 each Topical Daily   fluticasone  furoate-vilanterol  1 puff Inhalation Daily   heparin   5,000 Units Subcutaneous Q8H   naphazoline-pheniramine  2 drop Both Eyes BID   pantoprazole   40 mg Oral Q1200   sodium chloride  flush  10-40 mL Intracatheter Q12H   sodium chloride  flush  3 mL Intravenous Q12H   vancomycin  variable dose per unstable renal function (pharmacist dosing)   Does not apply See admin instructions   Continuous Infusions:      LOS: 3 days   Carol [name]  [facility]  06/27/2024, 9:33 AM

## 2024-06-27 NOTE — Progress Notes (Signed)
 NT reported smelling cigarette smoke in room. She reported to me. I also smelled cigarette smoke in room. I educated pt that she is not allowed to smoke in room and she denies having any more cigarettes. She reported she did not smoke a cigarette but spilled water  on a cigarette and threw it away. I asked why it smelled like cigarette smoke. She repeated she did not light/smoke cigarette. I explained safety risk due to oxygen  in hospital and that she could kill us  all with a fire. She said she is aware to not smoke in the room.

## 2024-06-27 NOTE — Plan of Care (Signed)
   Problem: Education: Goal: Knowledge of General Education information will improve Description: Including pain rating scale, medication(s)/side effects and non-pharmacologic comfort measures Outcome: Completed/Met

## 2024-06-28 ENCOUNTER — Inpatient Hospital Stay (HOSPITAL_COMMUNITY)

## 2024-06-28 DIAGNOSIS — N19 Unspecified kidney failure: Secondary | ICD-10-CM | POA: Diagnosis not present

## 2024-06-28 LAB — GLUCOSE, CAPILLARY
Glucose-Capillary: 100 mg/dL — ABNORMAL HIGH (ref 70–99)
Glucose-Capillary: 97 mg/dL (ref 70–99)
Glucose-Capillary: 131 mg/dL — ABNORMAL HIGH (ref 70–99)
Glucose-Capillary: 124 mg/dL — ABNORMAL HIGH (ref 70–99)

## 2024-06-28 NOTE — Progress Notes (Signed)
 [facility] Progress Note   Subjective:   Seen in room. No complaints. Denies cp, sob.    Objective Vitals:   06/27/24 1553 06/27/24 1930 06/28/24 0446 06/28/24 0809  BP: (!) 140/78 127/88 134/79 131/78  Pulse: 73 78 70 69  Resp: 20 18 16 18   Temp: 98.9 F (37.2 C) 98.3 F (36.8 C) 98.2 F (36.8 C) 98 F (36.7 C)  TempSrc:  Oral  Oral  SpO2: 98%  98% 100%  Weight:      Height:       Physical Exam General: Ill appearing woman in NAD. Room air. Heart:RRR no murmur Lungs: CTA anteriorly, no wheezing or rhonchi Abdomen: soft, non-tender, non-distended  Extremities: No BLE edema Dialysis Access: TDC removed; site covered.  LUE AVF +t/b but deep (1st stage BVT)   Additional Objective Labs: Basic Metabolic Panel: Recent Labs  Lab 06/24/24 1253 06/24/24 2233 06/25/24 0345 06/26/24 0906  NA 133*  --  132* 133*  K 4.1  --  3.4* 4.1  CL 93*  --  93* 92*  CO2 17*  --  23 24  GLUCOSE 51*  --  105* 89  BUN 67*  --  25* 29*  CREATININE 11.95* 6.41* 6.69* 7.66*  CALCIUM  8.8*  --  8.7* 8.4*  PHOS  --   --   --  5.4*   Liver Function Tests: Recent Labs  Lab 06/24/24 1253 06/26/24 0906  AST 32  --   ALT 14  --   ALKPHOS 162*  --   BILITOT 0.9  --   PROT 7.9  --   ALBUMIN  2.6* 2.2*    CBC: Recent Labs  Lab 06/24/24 1253 06/24/24 2233 06/25/24 0345 06/26/24 0906  WBC 6.5 5.8 6.6 6.0  NEUTROABS 3.6  --   --   --   HGB 12.2 13.9 11.6* 11.1*  HCT 36.7 41.0 34.6* 33.4*  MCV 84.8 83.3 83.0 84.8  PLT 288 297 280 308   Blood Culture    Component Value Date/Time   SDES BLOOD RIGHT ARM 06/26/2024 2045   SDES BLOOD RIGHT HAND 06/26/2024 2045   SPECREQUEST  06/26/2024 2045    BOTTLES DRAWN AEROBIC AND ANAEROBIC Blood Culture adequate volume   SPECREQUEST  06/26/2024 2045    BOTTLES DRAWN AEROBIC ONLY Blood Culture results may not be optimal due to an inadequate volume of blood received in culture bottles   CULT  06/26/2024 2045    NO GROWTH 2  DAYS Performed at [facility], [address], [place], [place] [address]    CULT  06/26/2024 2045    NO GROWTH 2 DAYS Performed at [facility], [address], [place], [place] [address]    REPTSTATUS PENDING 06/26/2024 2045   REPTSTATUS PENDING 06/26/2024 2045    Cardiac Enzymes: Recent Labs  Lab 06/24/24 1358  CKTOTAL 249*   CBG: Recent Labs  Lab 06/27/24 1235 06/27/24 1552 06/27/24 1930 06/28/24 0808 06/28/24 1103  GLUCAP 93 120* 120* 100* 97    Studies/Results: DG Shoulder Right Result Date: 06/28/2024 CLINICAL DATA:  Right shoulder pain EXAM: DG SHOULDER 2+V*R* COMPARISON:  Reformats from chest CT 06/24/2024 FINDINGS: Subjective bony under mineralization. There is no evidence of fracture or dislocation. Alignment and joint spaces are preserved. There is no evidence of arthropathy or other focal bone abnormality. Soft tissues are unremarkable. Healed right rib fractures IMPRESSION: No acute findings or explanation for pain. Electronically Signed   By: Andrea Gasman  M.D.   On: 06/28/2024 11:38   ECHOCARDIOGRAM COMPLETE Result Date: 06/26/2024    ECHOCARDIOGRAM REPORT   Patient Name:   Carol [name] Date of Exam: 06/26/2024 Medical Rec #:  [id number]               Height:       66.0 in Accession #:    [id number]              Weight:       137.1 lb Date of Birth:  Mar 12, 1961               BSA:          1.703 m Patient Age:    63 years                BP:           151/78 mmHg Patient Gender: F                       HR:           74 bpm. Exam Location:  Inpatient Procedure: 2D Echo, Cardiac Doppler and Color Doppler (Both Spectral and Color            Flow Doppler were utilized during procedure). Indications:    Dyspnea R06.00  History:        Patient has prior history of Echocardiogram examinations, most                 recent 01/05/2024. CHF, CAD, COPD and CKD, stage 4,                 Signs/Symptoms:Chest Pain; Risk Factors:Hypertension and                  Diabetes.  Sonographer:    Thea Norlander RCS Referring Phys: [phone] ABRAHAM FELIZ ORTIZ IMPRESSIONS  1. Systolic anterior motion of mitral valve. . Left ventricular ejection fraction, by estimation, is >75%. The left ventricle has hyperdynamic function. The left ventricle has no regional wall motion abnormalities. Left ventricular diastolic parameters were normal.  2. Right ventricular systolic function is normal. The right ventricular size is normal. Tricuspid regurgitation signal is inadequate for assessing PA pressure.  3. The mitral valve is degenerative. No evidence of mitral valve regurgitation. No evidence of mitral stenosis.  4. The aortic valve is tricuspid. Aortic valve regurgitation is not visualized. No aortic stenosis is present.  5. The inferior vena cava is normal in size with greater than 50% respiratory variability, suggesting right atrial pressure of 3 mmHg. Comparison(s): A prior study was performed on 01/09/2024. LVEF 70-75%, mild biatrial enlargement, mild MR. FINDINGS  Left Ventricle: Systolic anterior motion of mitral valve. Left ventricular ejection fraction, by estimation, is >75%. The left ventricle has hyperdynamic function. The left ventricle has no regional wall motion abnormalities. The left ventricular internal cavity size was normal in size. There is borderline left ventricular hypertrophy of the basal-septal segment. Left ventricular diastolic parameters were normal. Right Ventricle: The right ventricular size is normal. No increase in right ventricular wall thickness. Right ventricular systolic function is normal. Tricuspid regurgitation signal is inadequate for assessing PA pressure. Left Atrium: Left atrial size was normal in size. Right Atrium: Right atrial size was normal in size. Pericardium: There is no evidence of pericardial effusion. Mitral Valve: The mitral valve is degenerative in appearance. No evidence of mitral valve regurgitation. No evidence of mitral valve  stenosis. Tricuspid Valve:  The tricuspid valve is grossly normal. Tricuspid valve regurgitation is trivial. No evidence of tricuspid stenosis. Aortic Valve: The aortic valve is tricuspid. Aortic valve regurgitation is not visualized. No aortic stenosis is present. Aortic valve peak gradient measures 8.5 mmHg. Pulmonic Valve: The pulmonic valve was not well visualized. Pulmonic valve regurgitation is not visualized. Aorta: The aortic root and ascending aorta are structurally normal, with no evidence of dilitation. Venous: The inferior vena cava is normal in size with greater than 50% respiratory variability, suggesting right atrial pressure of 3 mmHg. IAS/Shunts: The atrial septum is grossly normal.  LEFT VENTRICLE PLAX 2D LVIDd:         4.00 cm   Diastology LVIDs:         2.10 cm   LV e' medial:    6.06 cm/s LV PW:         0.90 cm   LV E/e' medial:  11.6 LV IVS:        0.70 cm   LV e' lateral:   4.79 cm/s LVOT diam:     2.00 cm   LV E/e' lateral: 14.7 LV SV:         57 LV SV Index:   33 LVOT Area:     3.14 cm  RIGHT VENTRICLE             IVC RV S prime:     14.70 cm/s  IVC diam: 1.20 cm TAPSE (M-mode): 1.8 cm LEFT ATRIUM             Index        RIGHT ATRIUM           Index LA diam:        3.70 cm 2.17 cm/m   RA Area:     15.90 cm LA Vol (A2C):   30.6 ml 17.96 ml/m  RA Volume:   41.80 ml  24.54 ml/m LA Vol (A4C):   21.6 ml 12.68 ml/m LA Biplane Vol: 25.8 ml 15.15 ml/m  AORTIC VALVE AV Area (Vmax): 2.28 cm AV Vmax:        146.00 cm/s AV Peak Grad:   8.5 mmHg LVOT Vmax:      106.00 cm/s LVOT Vmean:     69.700 cm/s LVOT VTI:       0.180 m  AORTA Ao Root diam: 3.00 cm Ao Asc diam:  3.10 cm MITRAL VALVE MV Area (PHT): 3.91 cm    SHUNTS MV Decel Time: 194 msec    Systemic VTI:  0.18 m MV E velocity: 70.20 cm/s  Systemic Diam: 2.00 cm MV A velocity: 96.40 cm/s MV E/A ratio:  0.73 Sunit Tolia Electronically signed by Madonna Large Signature Date/Time: 06/26/2024/6:01:36 PM    Final    IR Removal Tun Cv Cath W/O  FL Result Date: 06/26/2024 INDICATION: History of ESRD requiring dialysis. Now with bacteremia. Request for tunneled dialysis catheter removal. EXAM: REMOVAL TUNNELED CENTRAL VENOUS CATHETER MEDICATIONS: 8 mL 1% lidocaine  ANESTHESIA/SEDATION: None FLUOROSCOPY: None COMPLICATIONS: None immediate. PROCEDURE: Informed written consent was obtained from the patient after a thorough discussion of the procedural risks, benefits and alternatives. All questions were addressed. Maximal Sterile Barrier Technique was utilized including caps, mask, sterile gowns, sterile gloves, sterile drape, hand hygiene and skin antiseptic. A timeout was performed prior to the initiation of the procedure. The patient's right chest and catheter was prepped and draped in a normal sterile fashion. Heparin  was removed from both ports of catheter. 1% lidocaine  was used for  local anesthesia. Using gentle blunt dissection the cuff of the catheter was exposed and the catheter was removed in it's entirety. Pressure was held till hemostasis was obtained. A sterile dressing was applied. The patient tolerated the procedure well with no immediate complications. IMPRESSION: Successful catheter removal as described above. Performed by: Wyatt Pommier, PA-C Electronically Signed   By: Juliene Balder M.D.   On: 06/26/2024 17:17   Medications:    amLODipine   10 mg Oral Daily   aspirin  EC  81 mg Oral Daily   atorvastatin   80 mg Oral Daily   Chlorhexidine  Gluconate Cloth  6 each Topical Daily   fluticasone  furoate-vilanterol  1 puff Inhalation Daily   heparin   5,000 Units Subcutaneous Q8H   naphazoline-pheniramine  2 drop Both Eyes BID   pantoprazole   40 mg Oral Q1200   sodium chloride  flush  10-40 mL Intracatheter Q12H   sodium chloride  flush  3 mL Intravenous Q12H   vancomycin  variable dose per unstable renal function (pharmacist dosing)   Does not apply See admin instructions    Dialysis Orders:  MWF - [facility] 4hr, 500/A1.5, EDW  64.9kg, 3K/2.5Ca bath, TDC + AVF, heparin  2000 unit bolus - Mircera 60mcg IV q 2 weeks (last 10/27) - Calcitriol  1.83mcg PO q HD   Assessment/Plan:  AMS: hepatic encephalopathy vs uremia:  Missed 1 week of HD prior to arrival, but BUN not high enough to suspect uremia as sole cause. Known cirrhosis with elevated ammonia. Lactulose started. Improving.   Bacteremia. Also with recent complaints of TDC tenderness. Blood cultures positive for MRSE (1/2). Plan for line holiday  IR consulted for [facility] removal - TDC removed 11/7 Repeat blood cultures to document clearance.  NGTD  IV Vancomycin  per primary  No vegetation on Echo  Will consult IR for new TDC on Monday  ESRD:  Usual MWF schedule. Next HD 11/10 after catheter placed.  Hypertension/volume: BP okay. Home BP meds restarted. Will continue to monitor with increased UF goal.   Anemia: Hgb 11.1 - no ESA needed for now.  Metabolic bone disease: Ca ok, Phos binder.   Cirrhosis: Ammonia elevated. Lactulose. See above.   T2DM  Hx CVA  Parkinson's disease: Fiance reports baseline memory is good.Continued restlessness of BLE. Defer to neurology for follow up.   Hx substance abuse: Fiance reports no recent use, cocaine  screen + as of last month.   Maisie Ronnald Acosta PA-C  [facility] 06/28/2024,1:06 PM

## 2024-06-28 NOTE — Progress Notes (Signed)
 [facility] PROGRESS NOTE    Progress Note  Carol [name]  FMW:[id number] DOB: April 15, 1961 DOA: 06/24/2024 PCP: Dor[facility], [facility]     Brief Narrative:   Carol [name] is an 63 y.o. female past medical history diabetes mellitus essential hypertension, chronic diastolic dysfunction end-stage renal disease on hemodialysis, polysubstance abuse  (including cocaine  tobacco and alcohol), recently discharged from the hospital for sepsis secondary to fungemia and Burkholderia bacteremia who was brought in by her fianc for confusion on the day of admission.  He relates she has not had dialysis since 10/29 .  Assessment/Plan:   Staph epidermidis bacteremia leading to metabolic encephalopathy: Blood cultures growing Staph epidermidis 1/2. Continue on IV vancomycin . Surveillance blood cultures negative till date 06/26/2024 negative till date. Appreciate ID's assistance. HD cath has been removed.  Hopefully IR to place tunneled catheter on 06/29/2024  End stage renal disease Monday Wednesday Friday : Did not had dialysis since 06/17/2024. With pulmonary edema. HD catheter removed on 06/26/2024. Nephrology was consulted status post HD Continue further management per nephrology  Right shoulder pain: Same side where her catheter was placed. X-ray of the shoulder is pending.  DM2 (diabetes mellitus, type 2) (HCC) Hemoglobin A1c was 4.1  Essential hypertension Resume home Norvasc .  Elevated ammonia level: She was given 2 doses of lactulose. She has no evidence of cirrhosis on imaging.  Hypervolemic hyponatremia: Per dialysis.  Left thyroid  nodule: Follow-up PCP as an outpatient She does not have obesity   DVT prophylaxis: lovenox  Family Communication:none Status is: Inpatient Remains inpatient appropriate because: Uremic encephalopathy    Code Status:     Code Status Orders  (From admission, onward)           Start     Ordered   06/24/24 1614   Full code  Continuous       Question:  By:  Answer:  Consent: discussion documented in EHR   06/24/24 1615           Code Status History     Date Active Date Inactive Code Status Order ID Comments User Context   01/03/2024 2031 01/16/2024 1540 Full Code [id number]  Arthea Child, MD ED   08/11/2023 0359 08/14/2023 1859 Full Code [id number]  Marcene Eva NOVAK, DO ED   07/31/2023 0838 08/05/2023 2111 Full Code [id number]  Claudene Maximino LABOR, MD ED   03/22/2023 1309 03/29/2023 1732 Full Code [id number]  Shona Terry SAILOR, DO ED   03/07/2023 2232 03/11/2023 2252 Full Code [id number]  Lonzell Emeline HERO, DO ED   11/01/2022 1031 11/07/2022 1847 Full Code [id number]  Waddell Rake, MD ED   07/22/2022 0031 07/22/2022 2059 Full Code [id number]  Waddell Rake, MD ED   05/09/2022 1010 05/11/2022 1758 Full Code [id number]  Leontine Elbe, DO ED   03/22/2022 1206 03/27/2022 1719 Full Code [id number]  Barbarann Nest, MD ED   03/10/2022 0636 03/17/2022 2253 Full Code [id number]  Alfornia Madison, MD ED   01/22/2022 1343 02/04/2022 1852 Full Code [id number]  Ricky Alfrieda DASEN, DO ED   12/31/2021 0204 01/03/2022 1745 Full Code [id number]  Laurence Locus, DO ED   12/20/2021 0459 12/24/2021 1952 Full Code [id number]  Marlin Lonni LABOR, MD ED   08/24/2014 1437 08/25/2014 0342 Full Code [id number]  Alona Corners, DO HOV   01/29/2014 2153 02/02/2014 1638 Full Code [id number]  Silvester Ales, MD Inpatient         IV Access:   Peripheral IV   Procedures and diagnostic studies:   ECHOCARDIOGRAM  COMPLETE Result Date: 06/26/2024    ECHOCARDIOGRAM REPORT   Patient Name:   Carol [name] Date of Exam: 06/26/2024 Medical Rec #:  [id number]               Height:       66.0 in Accession #:    [id number]              Weight:       137.1 lb Date of Birth:  1961-05-29               BSA:          1.703 m Patient Age:    63 years                BP:           151/78 mmHg Patient Gender: F                       HR:           74 bpm. Exam Location:   Inpatient Procedure: 2D Echo, Cardiac Doppler and Color Doppler (Both Spectral and Color            Flow Doppler were utilized during procedure). Indications:    Dyspnea R06.00  History:        Patient has prior history of Echocardiogram examinations, most                 recent 01/05/2024. CHF, CAD, COPD and CKD, stage 4,                 Signs/Symptoms:Chest Pain; Risk Factors:Hypertension and                 Diabetes.  Sonographer:    Thea Norlander RCS Referring Phys: [phone] Lateasha Breuer FELIZ ORTIZ IMPRESSIONS  1. Systolic anterior motion of mitral valve. . Left ventricular ejection fraction, by estimation, is >75%. The left ventricle has hyperdynamic function. The left ventricle has no regional wall motion abnormalities. Left ventricular diastolic parameters were normal.  2. Right ventricular systolic function is normal. The right ventricular size is normal. Tricuspid regurgitation signal is inadequate for assessing PA pressure.  3. The mitral valve is degenerative. No evidence of mitral valve regurgitation. No evidence of mitral stenosis.  4. The aortic valve is tricuspid. Aortic valve regurgitation is not visualized. No aortic stenosis is present.  5. The inferior vena cava is normal in size with greater than 50% respiratory variability, suggesting right atrial pressure of 3 mmHg. Comparison(s): A prior study was performed on 01/09/2024. LVEF 70-75%, mild biatrial enlargement, mild MR. FINDINGS  Left Ventricle: Systolic anterior motion of mitral valve. Left ventricular ejection fraction, by estimation, is >75%. The left ventricle has hyperdynamic function. The left ventricle has no regional wall motion abnormalities. The left ventricular internal cavity size was normal in size. There is borderline left ventricular hypertrophy of the basal-septal segment. Left ventricular diastolic parameters were normal. Right Ventricle: The right ventricular size is normal. No increase in right ventricular wall thickness. Right  ventricular systolic function is normal. Tricuspid regurgitation signal is inadequate for assessing PA pressure. Left Atrium: Left atrial size was normal in size. Right Atrium: Right atrial size was normal in size. Pericardium: There is no evidence of pericardial effusion. Mitral Valve: The mitral valve is degenerative in appearance. No evidence of mitral valve regurgitation. No evidence of mitral valve stenosis. Tricuspid Valve: The tricuspid valve is grossly normal. Tricuspid valve regurgitation  is trivial. No evidence of tricuspid stenosis. Aortic Valve: The aortic valve is tricuspid. Aortic valve regurgitation is not visualized. No aortic stenosis is present. Aortic valve peak gradient measures 8.5 mmHg. Pulmonic Valve: The pulmonic valve was not well visualized. Pulmonic valve regurgitation is not visualized. Aorta: The aortic root and ascending aorta are structurally normal, with no evidence of dilitation. Venous: The inferior vena cava is normal in size with greater than 50% respiratory variability, suggesting right atrial pressure of 3 mmHg. IAS/Shunts: The atrial septum is grossly normal.  LEFT VENTRICLE PLAX 2D LVIDd:         4.00 cm   Diastology LVIDs:         2.10 cm   LV e' medial:    6.06 cm/s LV PW:         0.90 cm   LV E/e' medial:  11.6 LV IVS:        0.70 cm   LV e' lateral:   4.79 cm/s LVOT diam:     2.00 cm   LV E/e' lateral: 14.7 LV SV:         57 LV SV Index:   33 LVOT Area:     3.14 cm  RIGHT VENTRICLE             IVC RV S prime:     14.70 cm/s  IVC diam: 1.20 cm TAPSE (M-mode): 1.8 cm LEFT ATRIUM             Index        RIGHT ATRIUM           Index LA diam:        3.70 cm 2.17 cm/m   RA Area:     15.90 cm LA Vol (A2C):   30.6 ml 17.96 ml/m  RA Volume:   41.80 ml  24.54 ml/m LA Vol (A4C):   21.6 ml 12.68 ml/m LA Biplane Vol: 25.8 ml 15.15 ml/m  AORTIC VALVE AV Area (Vmax): 2.28 cm AV Vmax:        146.00 cm/s AV Peak Grad:   8.5 mmHg LVOT Vmax:      106.00 cm/s LVOT Vmean:     69.700  cm/s LVOT VTI:       0.180 m  AORTA Ao Root diam: 3.00 cm Ao Asc diam:  3.10 cm MITRAL VALVE MV Area (PHT): 3.91 cm    SHUNTS MV Decel Time: 194 msec    Systemic VTI:  0.18 m MV E velocity: 70.20 cm/s  Systemic Diam: 2.00 cm MV A velocity: 96.40 cm/s MV E/A ratio:  0.73 Sunit Tolia Electronically signed by Madonna Large Signature Date/Time: 06/26/2024/6:01:36 PM    Final    IR Removal Tun Cv Cath W/O FL Result Date: 06/26/2024 INDICATION: History of ESRD requiring dialysis. Now with bacteremia. Request for tunneled dialysis catheter removal. EXAM: REMOVAL TUNNELED CENTRAL VENOUS CATHETER MEDICATIONS: 8 mL 1% lidocaine  ANESTHESIA/SEDATION: None FLUOROSCOPY: None COMPLICATIONS: None immediate. PROCEDURE: Informed written consent was obtained from the patient after a thorough discussion of the procedural risks, benefits and alternatives. All questions were addressed. Maximal Sterile Barrier Technique was utilized including caps, mask, sterile gowns, sterile gloves, sterile drape, hand hygiene and skin antiseptic. A timeout was performed prior to the initiation of the procedure. The patient's right chest and catheter was prepped and draped in a normal sterile fashion. Heparin  was removed from both ports of catheter. 1% lidocaine  was used for local anesthesia. Using gentle blunt dissection the cuff of  the catheter was exposed and the catheter was removed in it's entirety. Pressure was held till hemostasis was obtained. A sterile dressing was applied. The patient tolerated the procedure well with no immediate complications. IMPRESSION: Successful catheter removal as described above. Performed by: Wyatt Pommier, PA-C Electronically Signed   By: Juliene Balder M.D.   On: 06/26/2024 17:17     Medical Consultants:   None.   Subjective:    Carol [name] vomited this morning, but otherwise feels well.  Objective:    Vitals:   06/27/24 1553 06/27/24 1930 06/28/24 0446 06/28/24 0809  BP: (!) 140/78  127/88 134/79 131/78  Pulse: 73 78 70 69  Resp: 20 18 16 18   Temp: 98.9 F (37.2 C) 98.3 F (36.8 C) 98.2 F (36.8 C) 98 F (36.7 C)  TempSrc:  Oral  Oral  SpO2: 98%  98% 100%  Weight:      Height:       SpO2: 100 % O2 Flow Rate (L/min): 2 L/min   Intake/Output Summary (Last 24 hours) at 06/28/2024 0843 Last data filed at 06/27/2024 0915 Gross per 24 hour  Intake --  Output 0 ml  Net 0 ml   Filed Weights   06/24/24 2216 06/26/24 0851 06/26/24 1252  Weight: 62.2 kg 64.7 kg 62.2 kg    Exam: General exam: In no acute distress. Respiratory system: Good air movement and clear to auscultation. Cardiovascular system: S1 & S2 heard, RRR. No JVD. Gastrointestinal system: Abdomen is nondistended, soft and nontender.  Extremities: No pedal edema. Skin: No rashes, lesions or ulcers Psychiatry: Judgement and insight appear normal. Mood & affect appropriate.  Data Reviewed:    Labs: Basic Metabolic Panel: Recent Labs  Lab 06/24/24 1253 06/24/24 2233 06/25/24 0345 06/26/24 0906  NA 133*  --  132* 133*  K 4.1  --  3.4* 4.1  CL 93*  --  93* 92*  CO2 17*  --  23 24  GLUCOSE 51*  --  105* 89  BUN 67*  --  25* 29*  CREATININE 11.95* 6.41* 6.69* 7.66*  CALCIUM  8.8*  --  8.7* 8.4*  PHOS  --   --   --  5.4*   GFR Estimated Creatinine Clearance: 7.2 mL/min (A) (by C-G formula based on SCr of 7.66 mg/dL (H)). Liver Function Tests: Recent Labs  Lab 06/24/24 1253 06/26/24 0906  AST 32  --   ALT 14  --   ALKPHOS 162*  --   BILITOT 0.9  --   PROT 7.9  --   ALBUMIN  2.6* 2.2*   No results for input(s): LIPASE, AMYLASE in the last 168 hours. Recent Labs  Lab 06/24/24 1355  AMMONIA 70*   Coagulation profile No results for input(s): INR, PROTIME in the last 168 hours. COVID-19 Labs  No results for input(s): DDIMER, FERRITIN, LDH, CRP in the last 72 hours.  Lab Results  Component Value Date   SARSCOV2NAA NEGATIVE 06/24/2024   SARSCOV2NAA NEGATIVE  06/07/2024   SARSCOV2NAA NEGATIVE 01/03/2024   SARSCOV2NAA NEGATIVE 11/07/2023    CBC: Recent Labs  Lab 06/24/24 1253 06/24/24 2233 06/25/24 0345 06/26/24 0906  WBC 6.5 5.8 6.6 6.0  NEUTROABS 3.6  --   --   --   HGB 12.2 13.9 11.6* 11.1*  HCT 36.7 41.0 34.6* 33.4*  MCV 84.8 83.3 83.0 84.8  PLT 288 297 280 308   Cardiac Enzymes: Recent Labs  Lab 06/24/24 1358  CKTOTAL 249*   BNP (last 3 results)  No results for input(s): PROBNP in the last 8760 hours. CBG: Recent Labs  Lab 06/27/24 0843 06/27/24 1235 06/27/24 1552 06/27/24 1930 06/28/24 0808  GLUCAP 89 93 120* 120* 100*   D-Dimer: No results for input(s): DDIMER in the last 72 hours. Hgb A1c: No results for input(s): HGBA1C in the last 72 hours.  Lipid Profile: No results for input(s): CHOL, HDL, LDLCALC, TRIG, CHOLHDL, LDLDIRECT in the last 72 hours. Thyroid  function studies: No results for input(s): TSH, T4TOTAL, T3FREE, THYROIDAB in the last 72 hours.  Invalid input(s): FREET3 Anemia work up: No results for input(s): VITAMINB12, FOLATE, FERRITIN, TIBC, IRON , RETICCTPCT in the last 72 hours. Sepsis Labs: Recent Labs  Lab 06/24/24 1253 06/24/24 1303 06/24/24 1633 06/24/24 2233 06/25/24 0345 06/26/24 0906  WBC 6.5  --   --  5.8 6.6 6.0  LATICACIDVEN  --  0.9 0.8  --   --   --    Microbiology Recent Results (from the past 240 hours)  Resp panel by RT-PCR (RSV, Flu A&B, Covid) Anterior Nasal Swab     Status: None   Collection Time: 06/24/24 11:19 AM   Specimen: Anterior Nasal Swab  Result Value Ref Range Status   SARS Coronavirus 2 by RT PCR NEGATIVE NEGATIVE Final   Influenza A by PCR NEGATIVE NEGATIVE Final   Influenza B by PCR NEGATIVE NEGATIVE Final    Comment: (NOTE) The Xpert Xpress SARS-CoV-2/FLU/RSV plus assay is intended as an aid in the diagnosis of influenza from Nasopharyngeal swab specimens and should not be used as a sole basis for treatment.  Nasal washings and aspirates are unacceptable for Xpert Xpress SARS-CoV-2/FLU/RSV testing.  Fact Sheet for Patients: bloggercourse.com  Fact Sheet for Healthcare Providers: seriousbroker.it  This test is not yet approved or cleared by the [place]  FDA and has been authorized for detection and/or diagnosis of SARS-CoV-2 by FDA under an Emergency Use Authorization (EUA). This EUA will remain in effect (meaning this test can be used) for the duration of the COVID-19 declaration under Section 564(b)(1) of the Act, 21 U.S.C. section 360bbb-3(b)(1), unless the authorization is terminated or revoked.     Resp Syncytial Virus by PCR NEGATIVE NEGATIVE Final    Comment: (NOTE) Fact Sheet for Patients: bloggercourse.com  Fact Sheet for Healthcare Providers: seriousbroker.it  This test is not yet approved or cleared by the [place]  FDA and has been authorized for detection and/or diagnosis of SARS-CoV-2 by FDA under an Emergency Use Authorization (EUA). This EUA will remain in effect (meaning this test can be used) for the duration of the COVID-19 declaration under Section 564(b)(1) of the Act, 21 U.S.C. section 360bbb-3(b)(1), unless the authorization is terminated or revoked.  Performed at [facility], [address], [place], [place] [address]   Blood culture (routine x 2)     Status: None (Preliminary result)   Collection Time: 06/24/24 11:36 AM   Specimen: BLOOD  Result Value Ref Range Status   Specimen Description BLOOD RIGHT ANTECUBITAL  Final   Special Requests   Final    BOTTLES DRAWN AEROBIC ONLY Blood Culture results may not be optimal due to an inadequate volume of blood received in culture bottles   Culture   Final    NO GROWTH 4 DAYS Performed at [facility], [address], [place], [place] [address]    Report Status PENDING  Incomplete  Blood  culture (routine x 2)     Status: Abnormal   Collection Time: 06/24/24  11:41 AM   Specimen: BLOOD  Result Value Ref Range Status   Specimen Description BLOOD BLOOD RIGHT HAND  Final   Special Requests   Final    BOTTLES DRAWN AEROBIC ONLY Blood Culture results may not be optimal due to an inadequate volume of blood received in culture bottles   Culture  Setup Time   Final    GRAM POSITIVE COCCI AEROBIC BOTTLE ONLY RBV V BRYK PHARMD 06/26/2024 @ 0514 BY DD    Culture (A)  Final    STAPHYLOCOCCUS EPIDERMIDIS THE SIGNIFICANCE OF ISOLATING THIS ORGANISM FROM A SINGLE SET OF BLOOD CULTURES WHEN MULTIPLE SETS ARE DRAWN IS UNCERTAIN. PLEASE NOTIFY THE MICROBIOLOGY DEPARTMENT WITHIN ONE WEEK IF SPECIATION AND SENSITIVITIES ARE REQUIRED. Performed at [facility], [address] [place], [place] [address]    Report Status 06/27/2024 FINAL  Final  Blood Culture ID Panel (Reflexed)     Status: Abnormal   Collection Time: 06/24/24 11:41 AM  Result Value Ref Range Status   Enterococcus faecalis NOT DETECTED NOT DETECTED Final   Enterococcus Faecium NOT DETECTED NOT DETECTED Final   Listeria monocytogenes NOT DETECTED NOT DETECTED Final   Staphylococcus species DETECTED (A) NOT DETECTED Final    Comment: CRITICAL RESULT CALLED TO, READ BACK BY AND VERIFIED WITH: RBV V BRYK PHARMD 06/26/2024 @ 0514 BY DD    Staphylococcus aureus (BCID) NOT DETECTED NOT DETECTED Final   Staphylococcus epidermidis DETECTED (A) NOT DETECTED Final    Comment: Methicillin (oxacillin) resistant coagulase negative staphylococcus. Possible blood culture contaminant (unless isolated from more than one blood culture draw or clinical case suggests pathogenicity). No antibiotic treatment is indicated for blood  culture contaminants. CRITICAL RESULT CALLED TO, READ BACK BY AND VERIFIED WITH: RBV V BRYK PHARMD 06/26/2024 @ 0514 BY DD    Staphylococcus lugdunensis NOT DETECTED NOT DETECTED Final   Streptococcus species NOT  DETECTED NOT DETECTED Final   Streptococcus agalactiae NOT DETECTED NOT DETECTED Final   Streptococcus pneumoniae NOT DETECTED NOT DETECTED Final   Streptococcus pyogenes NOT DETECTED NOT DETECTED Final   A.calcoaceticus-baumannii NOT DETECTED NOT DETECTED Final   Bacteroides fragilis NOT DETECTED NOT DETECTED Final   Enterobacterales NOT DETECTED NOT DETECTED Final   Enterobacter cloacae complex NOT DETECTED NOT DETECTED Final   Escherichia coli NOT DETECTED NOT DETECTED Final   Klebsiella aerogenes NOT DETECTED NOT DETECTED Final   Klebsiella oxytoca NOT DETECTED NOT DETECTED Final   Klebsiella pneumoniae NOT DETECTED NOT DETECTED Final   Proteus species NOT DETECTED NOT DETECTED Final   Salmonella species NOT DETECTED NOT DETECTED Final   Serratia marcescens NOT DETECTED NOT DETECTED Final   Haemophilus influenzae NOT DETECTED NOT DETECTED Final   Neisseria meningitidis NOT DETECTED NOT DETECTED Final   Pseudomonas aeruginosa NOT DETECTED NOT DETECTED Final   Stenotrophomonas maltophilia NOT DETECTED NOT DETECTED Final   Candida albicans NOT DETECTED NOT DETECTED Final   Candida auris NOT DETECTED NOT DETECTED Final   Candida glabrata NOT DETECTED NOT DETECTED Final   Candida krusei NOT DETECTED NOT DETECTED Final   Candida parapsilosis NOT DETECTED NOT DETECTED Final   Candida tropicalis NOT DETECTED NOT DETECTED Final   Cryptococcus neoformans/gattii NOT DETECTED NOT DETECTED Final   Methicillin resistance mecA/C DETECTED (A) NOT DETECTED Final    Comment: CRITICAL RESULT CALLED TO, READ BACK BY AND VERIFIED WITH: RBV V BRYK PHARMD 06/26/2024 @ 0514 BY DD Performed at [facility], [address] [place], [place] [address]  MRSA Next Gen by PCR, Nasal     Status: None   Collection Time: 06/24/24 10:39 PM   Specimen: Nasal Mucosa; Nasal Swab  Result Value Ref Range Status   MRSA by PCR Next Gen NOT DETECTED NOT DETECTED Final    Comment: (NOTE) The GeneXpert MRSA Assay  (FDA approved for NASAL specimens only), is one component of a comprehensive MRSA colonization surveillance program. It is not intended to diagnose MRSA infection nor to guide or monitor treatment for MRSA infections. Test performance is not FDA approved in patients less than 42 years old. Performed at [facility], [address] [place], [place] [address]   Culture, blood (Routine X 2) w Reflex to ID Panel     Status: None (Preliminary result)   Collection Time: 06/26/24  8:45 PM   Specimen: BLOOD RIGHT ARM  Result Value Ref Range Status   Specimen Description BLOOD RIGHT ARM  Final   Special Requests   Final    BOTTLES DRAWN AEROBIC AND ANAEROBIC Blood Culture adequate volume   Culture   Final    NO GROWTH 2 DAYS Performed at [facility], [address], [place], [place] [address]    Report Status PENDING  Incomplete  Culture, blood (Routine X 2) w Reflex to ID Panel     Status: None (Preliminary result)   Collection Time: 06/26/24  8:45 PM   Specimen: BLOOD RIGHT HAND  Result Value Ref Range Status   Specimen Description BLOOD RIGHT HAND  Final   Special Requests   Final    BOTTLES DRAWN AEROBIC ONLY Blood Culture results may not be optimal due to an inadequate volume of blood received in culture bottles   Culture   Final    NO GROWTH 2 DAYS Performed at [facility], [address], [place], [place] [address]    Report Status PENDING  Incomplete     Medications:    amLODipine   10 mg Oral Daily   aspirin  EC  81 mg Oral Daily   atorvastatin   80 mg Oral Daily   Chlorhexidine  Gluconate Cloth  6 each Topical Daily   fluticasone  furoate-vilanterol  1 puff Inhalation Daily   heparin   5,000 Units Subcutaneous Q8H   naphazoline-pheniramine  2 drop Both Eyes BID   pantoprazole   40 mg Oral Q1200   sodium chloride  flush  10-40 mL Intracatheter Q12H   sodium chloride  flush  3 mL Intravenous Q12H   vancomycin  variable dose per unstable renal function  (pharmacist dosing)   Does not apply See admin instructions   Continuous Infusions:      LOS: 4 days   Erle Odell Castor  [facility]  06/28/2024, 8:43 AM

## 2024-06-29 ENCOUNTER — Inpatient Hospital Stay (HOSPITAL_COMMUNITY)

## 2024-06-29 DIAGNOSIS — N19 Unspecified kidney failure: Secondary | ICD-10-CM | POA: Diagnosis not present

## 2024-06-29 HISTORY — PX: IR TUNNELED CENTRAL VENOUS CATH PLC W IMG: IMG1939

## 2024-06-29 LAB — VANCOMYCIN, RANDOM: Vancomycin Rm: 25 ug/mL

## 2024-06-29 LAB — CULTURE, BLOOD (ROUTINE X 2): Culture: NO GROWTH

## 2024-06-29 LAB — GLUCOSE, CAPILLARY
Glucose-Capillary: 88 mg/dL (ref 70–99)
Glucose-Capillary: 80 mg/dL (ref 70–99)

## 2024-06-29 MED ORDER — ALTEPLASE 2 MG IJ SOLR: 2.0000 mg | Freq: Once | INTRAMUSCULAR | Status: DC | PRN

## 2024-06-29 MED ORDER — FENTANYL CITRATE (PF) 100 MCG/2ML IJ SOLN
INTRAMUSCULAR | Status: AC | PRN
  Administered 2024-06-29 (×2): 25 ug via INTRAVENOUS

## 2024-06-29 MED ORDER — ONDANSETRON HCL 4 MG/2ML IJ SOLN
INTRAMUSCULAR | Status: AC | PRN
  Administered 2024-06-29: 4 mg via INTRAVENOUS

## 2024-06-29 MED ORDER — PENTAFLUOROPROP-TETRAFLUOROETH EX AERO: 1.0000 | INHALATION_SPRAY | CUTANEOUS | Status: DC | PRN

## 2024-06-29 MED ORDER — LIDOCAINE HCL (PF) 1 % IJ SOLN: 5.0000 mL | INTRAMUSCULAR | Status: DC | PRN

## 2024-06-29 MED ORDER — HEPARIN SODIUM (PORCINE) 1000 UNIT/ML IJ SOLN
INTRAMUSCULAR | Status: AC
Start: 2024-06-29 — End: 2024-06-29
  Filled 2024-06-29: qty 4

## 2024-06-29 MED ORDER — VANCOMYCIN HCL 750 MG/150ML IV SOLN
750.0000 mg | INTRAVENOUS | Status: DC
  Filled 2024-06-29: qty 150

## 2024-06-29 MED ORDER — HEPARIN SODIUM (PORCINE) 1000 UNIT/ML DIALYSIS
1000.0000 [IU] | INTRAMUSCULAR | Status: DC | PRN
Start: 2024-06-29 — End: 2024-06-29
  Administered 2024-06-29: 1000 [IU]

## 2024-06-29 MED ORDER — ONDANSETRON HCL 4 MG/2ML IJ SOLN
INTRAMUSCULAR | Status: AC
  Filled 2024-06-29: qty 2

## 2024-06-29 MED ORDER — LIDOCAINE-EPINEPHRINE 1 %-1:100000 IJ SOLN
INTRAMUSCULAR | Status: AC
Start: 2024-06-29 — End: 2024-06-29
  Filled 2024-06-29: qty 1

## 2024-06-29 MED ORDER — HEPARIN SODIUM (PORCINE) 1000 UNIT/ML IJ SOLN
4000.0000 [IU] | Freq: Once | INTRAMUSCULAR | Status: AC
  Administered 2024-06-29: 3.8 mL
  Filled 2024-06-29: qty 4

## 2024-06-29 MED ORDER — HEPARIN SODIUM (PORCINE) 1000 UNIT/ML DIALYSIS: 2000.0000 [IU] | INTRAMUSCULAR | Status: DC | PRN

## 2024-06-29 MED ORDER — MIDAZOLAM HCL 2 MG/2ML IJ SOLN
INTRAMUSCULAR | Status: AC
  Filled 2024-06-29: qty 2

## 2024-06-29 MED ORDER — FENTANYL CITRATE (PF) 100 MCG/2ML IJ SOLN
INTRAMUSCULAR | Status: AC
  Filled 2024-06-29: qty 2

## 2024-06-29 MED ORDER — LIDOCAINE-EPINEPHRINE 1 %-1:100000 IJ SOLN
20.0000 mL | Freq: Once | INTRAMUSCULAR | Status: AC
  Administered 2024-06-29: 10 mL
  Filled 2024-06-29: qty 20

## 2024-06-29 MED ORDER — HEPARIN SODIUM (PORCINE) 1000 UNIT/ML IJ SOLN: 4000.0000 [IU] | Freq: Once | INTRAMUSCULAR | Status: DC

## 2024-06-29 MED ORDER — ANTICOAGULANT SODIUM CITRATE 4% (200MG/5ML) IV SOLN: 5.0000 mL | Status: DC | PRN

## 2024-06-29 MED ORDER — MIDAZOLAM HCL (PF) 2 MG/2ML IJ SOLN
INTRAMUSCULAR | Status: AC | PRN
  Administered 2024-06-29: 1 mg via INTRAVENOUS
  Administered 2024-06-29: .5 mg via INTRAVENOUS

## 2024-06-29 MED ORDER — HEPARIN SODIUM (PORCINE) 1000 UNIT/ML IJ SOLN
INTRAMUSCULAR | Status: AC
  Filled 2024-06-29: qty 10

## 2024-06-29 MED ORDER — LIDOCAINE-PRILOCAINE 2.5-2.5 % EX CREA: 1.0000 | TOPICAL_CREAM | CUTANEOUS | Status: DC | PRN

## 2024-06-29 NOTE — Consult Note (Signed)
   Patient Status: [name] - In-pt  Assessment and Plan: Patient in need of venous access.   Tunneled dialysis catheter placement   ______________________________________________________________________   History of Present Illness: Carol [name] is a 63 y.o. female   FULL Code status per pt ESRD +infection ; BC staph 11/5 11/7 no growth Afeb; wbc wnl On Vancomycin  Scheduled for tunneled dialysis catheter per Nephrology request   Allergies and medications reviewed.   Review of Systems: A 12 point ROS discussed and pertinent positives are indicated in the HPI above.  All other systems are negative.  Review of Systems  Constitutional:  Negative for activity change and fever.  Respiratory:  Negative for cough and shortness of breath.   Gastrointestinal:  Negative for nausea.  Neurological:  Negative for weakness.  Psychiatric/Behavioral:  Negative for behavioral problems and confusion.     Vital Signs: BP (!) 158/75   Pulse 70   Temp 97.9 F (36.6 C) (Oral)   Resp 18   Ht 5' 6 (1.676 m)   Wt 137 lb 2 oz (62.2 kg)   SpO2 100%   BMI 22.13 kg/m   Physical Exam Vitals reviewed.  HENT:     Mouth/Throat:     Mouth: Mucous membranes are moist.  Cardiovascular:     Rate and Rhythm: Normal rate and regular rhythm.     Heart sounds: Normal heart sounds.  Pulmonary:     Effort: Pulmonary effort is normal.     Breath sounds: Normal breath sounds. No wheezing.  Abdominal:     Palpations: Abdomen is soft.  Musculoskeletal:        General: Normal range of motion.  Skin:    General: Skin is warm.  Neurological:     Mental Status: She is oriented to person, place, and time.  Psychiatric:        Behavior: Behavior normal.      Imaging reviewed.   Labs:  COAGS: Recent Labs    08/11/23 0544 08/11/23 0930 01/09/24 0648  INR >10.0* 1.0 1.1    BMP: Recent Labs    06/07/24 0304 06/24/24 1253 06/24/24 2233 06/25/24 0345 06/26/24 0906  NA  131* 133*  --  132* 133*  K 5.2* 4.1  --  3.4* 4.1  CL 99 93*  --  93* 92*  CO2 18* 17*  --  23 24  GLUCOSE 81 51*  --  105* 89  BUN 55* 67*  --  25* 29*  CALCIUM  9.3 8.8*  --  8.7* 8.4*  CREATININE 9.91* 11.95* 6.41* 6.69* 7.66*  GFRNONAA 4* 3* 7* 7* 6*   Scheduled for tunneled dialysis catheter placement in IR Risks and benefits discussed with the patient including, but not limited to bleeding, infection, vascular injury, pneumothorax which may require chest tube placement, air embolism or even death  All of the patient's questions were answered, patient is agreeable to proceed. Consent signed and in chart.    Electronically Signed: Sharlet DELENA Candle, PA-C 06/29/2024, 9:11 AM   I spent a total of 15 minutes in face to face in clinical consultation, greater than 50% of which was counseling/coordinating care for venous access.

## 2024-06-29 NOTE — Progress Notes (Signed)
 Received patient in bed to unit.  Alert and oriented.  Informed consent signed and in chart.   TX duration: 3  Patient tolerated well.  Transported back to the room  Alert, without acute distress. Responds appropriately when awakened Hand-off given to patient's nurse. Shift RN  Access used: Dialysis Catheter Access issues: None  Total UF removed: 1500 Medication(s) given: None Post HD VS: T98.1-HR84-RR24 B/P127/95 Post HD weight: 63.5kg  Neville Seip, RN [facility]   06/29/24 2115  Vitals  Temp 98.1 F (36.7 C)  Temp Source Axillary  BP (!) 127/95  MAP (mmHg) 105  BP Location Right Arm  BP Method Automatic  Patient Position (if appropriate) Lying  Pulse Rate 83  Pulse Rate Source Monitor  ECG Heart Rate 84  Resp (!) 24  Weight 63.5 kg  Type of Weight Post-Dialysis  Oxygen  Therapy  SpO2 99 %  O2 Device Room Air  Patient Activity (if Appropriate) In bed  Pulse Oximetry Type Continuous  During Treatment Monitoring  Blood Flow Rate (mL/min) 0 mL/min  Arterial Pressure (mmHg) 25.65 mmHg  Venous Pressure (mmHg) -299.18 mmHg  TMP (mmHg) 12.12 mmHg  Ultrafiltration Rate (mL/min) 0 mL/min  Dialysate Flow Rate (mL/min) 299 ml/min  Dialysate Potassium Concentration 3  Dialysate Calcium  Concentration 2.5  Duration of HD Treatment -hour(s) 3 hour(s)  Cumulative Fluid Removed (mL) per Treatment  1500.14  HD Safety Checks Performed Yes  Intra-Hemodialysis Comments Tx completed;Tolerated well  Post Treatment  Dialyzer Clearance Lightly streaked  Liters Processed 61.7  Fluid Removed (mL) 1500 mL  Tolerated HD Treatment Yes  Hemodialysis Catheter Right Internal jugular Double lumen Permanent (Tunneled)  Placement Date/Time: 06/29/24 1629   Serial / Lot #: [id number]  Expiration Date: 05/20/28  Time Out: Correct patient;Correct site;Correct procedure  Maximum sterile barrier precautions: Hand hygiene;Sterile probe cover;Cap;Mask;Sterile gown;Sterile g...  Site  Condition No complications  Blue Lumen Status Flushed;Antimicrobial dead end cap;Heparin  locked  Red Lumen Status Flushed;Antimicrobial dead end cap;Heparin  locked  Purple Lumen Status N/A  Catheter fill solution Heparin  1000 units/ml  Catheter fill volume (Arterial) 1.9 cc  Catheter fill volume (Venous) 1.9  Dressing Type Transparent  Dressing Status Antimicrobial disc/dressing in place;Clean, Dry, Intact  Drainage Description None  Dressing Change Due 07/06/24  Post treatment catheter status Capped and Clamped

## 2024-06-29 NOTE — Procedures (Signed)
 Interventional Radiology Procedure Note  Procedure: Fluoro Guided placement of tunneled HD catheter  Complications: None  Estimated Blood Loss: < 10 mL  Findings: RIJ access for tunneled HD line.  Tip of catheter at cavoatrial junction.  Cordella DELENA Banner, MD

## 2024-06-29 NOTE — Progress Notes (Signed)
 Pharmacy Antibiotic Note  Carol [name] is a 63 y.o. female started on Vancomycin  11/7 early am per Pharmacy consult for MRSE in 1/3 blood cultures.    Vancomycin  1250 mg IV given 11/7 HD catheter removal 11/7 TDC placed 11/10 with plans for HD 11/10 - vancomycin  pre - HD level 25 (ok for dosing after HD)  Repeat blood cultures NGTD   Plan: Vancomycin  750 mg iv Q HD - MWF Target pre-dialysis Vanc level 15-25 mcg/ml. Follow up cultures.  Height: 5' 6 (167.6 cm) Weight: 62.2 kg (137 lb 2 oz) IBW/kg (Calculated) : 59.3  Temp (24hrs), Avg:97.9 F (36.6 C), Min:97.6 F (36.4 C), Max:98.1 F (36.7 C)  Recent Labs  Lab 06/24/24 1253 06/24/24 1303 06/24/24 1633 06/24/24 2233 06/25/24 0345 06/26/24 0906 06/29/24 0453  WBC 6.5  --   --  5.8 6.6 6.0  --   CREATININE 11.95*  --   --  6.41* 6.69* 7.66*  --   LATICACIDVEN  --  0.9 0.8  --   --   --   --   VANCORANDOM  --   --   --   --   --   --  25    Estimated Creatinine Clearance: 7.2 mL/min (A) (by C-G formula based on SCr of 7.66 mg/dL (H)).    Allergies  Allergen Reactions   Penicillins Shortness Of Breath and Other (See Comments)    Caused yeast infection   Ultram  [Tramadol ] Other (See Comments)    Made her tongue raw    Antimicrobials this admission: Vancomycin  11/7 >>  Microbiology results: 11/7 blood: NGTD 11/5 BICD: MRSE in 1/3 bottles 11/5 blood: GPC in aerobic bottle only - pending 11/5 blood: no growth x 2 days to date 11/5 MRSA PCR: negative 11/5 COVID, flu and RSV: negative  Thank you for allowing pharmacy to be a part of this patient's care.  Perri Olam Murray, RPh 06/29/2024 9:23 AM

## 2024-06-29 NOTE — Progress Notes (Signed)
 [facility] Progress Note   Subjective:   Seen in room. No complaints. Denies cp, sob.  Plan for new [facility] today then dialysis    Objective Vitals:   06/29/24 0524 06/29/24 0729 06/29/24 0750 06/29/24 0906  BP: (!) 166/84 (!) 158/75  (!) 158/75  Pulse: 69 70    Resp: 18 18    Temp: 97.9 F (36.6 C) 97.9 F (36.6 C)    TempSrc: Oral Oral    SpO2: 100% 100% 100%   Weight:      Height:       Physical Exam General: Ill appearing woman in NAD. Room air. Heart:RRR no murmur Lungs: CTA anteriorly, no wheezing or rhonchi Abdomen: soft, non-tender, non-distended  Extremities: No BLE edema Dialysis Access: TDC removed; site covered.  LUE AVF +t/b but deep (1st stage BVT)   Additional Objective Labs: Basic Metabolic Panel: Recent Labs  Lab 06/24/24 1253 06/24/24 2233 06/25/24 0345 06/26/24 0906  NA 133*  --  132* 133*  K 4.1  --  3.4* 4.1  CL 93*  --  93* 92*  CO2 17*  --  23 24  GLUCOSE 51*  --  105* 89  BUN 67*  --  25* 29*  CREATININE 11.95* 6.41* 6.69* 7.66*  CALCIUM  8.8*  --  8.7* 8.4*  PHOS  --   --   --  5.4*   Liver Function Tests: Recent Labs  Lab 06/24/24 1253 06/26/24 0906  AST 32  --   ALT 14  --   ALKPHOS 162*  --   BILITOT 0.9  --   PROT 7.9  --   ALBUMIN  2.6* 2.2*    CBC: Recent Labs  Lab 06/24/24 1253 06/24/24 2233 06/25/24 0345 06/26/24 0906  WBC 6.5 5.8 6.6 6.0  NEUTROABS 3.6  --   --   --   HGB 12.2 13.9 11.6* 11.1*  HCT 36.7 41.0 34.6* 33.4*  MCV 84.8 83.3 83.0 84.8  PLT 288 297 280 308   Blood Culture    Component Value Date/Time   SDES BLOOD RIGHT ARM 06/26/2024 2045   SDES BLOOD RIGHT HAND 06/26/2024 2045   SPECREQUEST  06/26/2024 2045    BOTTLES DRAWN AEROBIC AND ANAEROBIC Blood Culture adequate volume   SPECREQUEST  06/26/2024 2045    BOTTLES DRAWN AEROBIC ONLY Blood Culture results may not be optimal due to an inadequate volume of blood received in culture bottles   CULT  06/26/2024 2045    NO GROWTH 3  DAYS Performed at [facility], [address] [place], [place] [address]    CULT  06/26/2024 2045    NO GROWTH 3 DAYS Performed at [facility], [address] [place], [place] [address]    REPTSTATUS PENDING 06/26/2024 2045   REPTSTATUS PENDING 06/26/2024 2045    Cardiac Enzymes: Recent Labs  Lab 06/24/24 1358  CKTOTAL 249*   CBG: Recent Labs  Lab 06/28/24 0808 06/28/24 1103 06/28/24 1623 06/28/24 2034 06/29/24 0727  GLUCAP 100* 97 131* 124* 88    Studies/Results: DG Shoulder Right Result Date: 06/28/2024 CLINICAL DATA:  Right shoulder pain EXAM: DG SHOULDER 2+V*R* COMPARISON:  Reformats from chest CT 06/24/2024 FINDINGS: Subjective bony under mineralization. There is no evidence of fracture or dislocation. Alignment and joint spaces are preserved. There is no evidence of arthropathy or other focal bone abnormality. Soft tissues are unremarkable. Healed right rib fractures IMPRESSION: No acute findings or explanation for pain. Electronically Signed  By: Andrea Gasman M.D.   On: 06/28/2024 11:38   Medications:  vancomycin        amLODipine   10 mg Oral Daily   aspirin  EC  81 mg Oral Daily   atorvastatin   80 mg Oral Daily   Chlorhexidine  Gluconate Cloth  6 each Topical Daily   fluticasone  furoate-vilanterol  1 puff Inhalation Daily   heparin   5,000 Units Subcutaneous Q8H   naphazoline-pheniramine  2 drop Both Eyes BID   pantoprazole   40 mg Oral Q1200   sodium chloride  flush  10-40 mL Intracatheter Q12H   sodium chloride  flush  3 mL Intravenous Q12H    Dialysis Orders:  MWF - [facility] 4hr, 500/A1.5, EDW 64.9kg, 3K/2.5Ca bath, TDC + AVF, heparin  2000 unit bolus - Mircera 60mcg IV q 2 weeks (last 10/27) - Calcitriol  1.41mcg PO q HD   Assessment/Plan:  AMS: hepatic encephalopathy vs uremia:  Missed 1 week of HD prior to arrival, but BUN not high enough to suspect uremia as sole cause. Known cirrhosis with elevated ammonia. Lactulose started.  Improving.   Bacteremia. Also with recent complaints of TDC tenderness. Blood cultures positive for MRSE (1/2). Plan for line holiday  IR consulted for [facility] removal - TDC removed 11/7 Repeat blood cultures to document clearance.  NGTD  IV Vancomycin  per primary  No vegetation on Echo  Consult IR for new TDC on Monday  Continue 2 wk course of IV Vanc per ID -- EOT 07/10/24 ESRD:  Usual MWF schedule. Next HD 11/10 after catheter placed.  Hypertension/volume: BP okay. Home BP meds restarted. Will continue to monitor with increased UF goal.   Anemia: Hgb 11.1 - no ESA needed for now.  Metabolic bone disease: Ca ok, Phos binder.   Cirrhosis: Ammonia elevated. Lactulose. See above.   T2DM  Hx CVA  Parkinson's disease: Fiance reports baseline memory is good.Continued restlessness of BLE. Defer to neurology for follow up.   Hx substance abuse: Fiance reports no recent use, cocaine  screen + as of last month.   Maisie Ronnald Acosta PA-C [facility] 06/29/2024,10:32 AM

## 2024-06-29 NOTE — Care Management Important Message (Signed)
 Important Message  Patient Details  Name: Carol [name] MRN: [id number] Date of Birth: 16-Feb-1961   Important Message Given:  Yes - Medicare IM     Claretta Deed 06/29/2024, 2:57 PM

## 2024-06-29 NOTE — Progress Notes (Signed)
 [facility] PROGRESS NOTE    Progress Note  Carol [name]  FMW:[id number] DOB: 22-Aug-1960 DOA: 06/24/2024 PCP: Dor[facility], [facility]     Brief Narrative:   Carol [name] is an 63 y.o. female past medical history diabetes mellitus essential hypertension, chronic diastolic dysfunction end-stage renal disease on hemodialysis, polysubstance abuse  (including cocaine  tobacco and alcohol), recently discharged from the hospital for sepsis secondary to fungemia and Burkholderia bacteremia who was brought in by her fianc for confusion on the day of admission.  He relates she has not had dialysis since 10/29 .  Assessment/Plan:   Staph epidermidis bacteremia leading to metabolic encephalopathy: Blood cultures growing Staph epidermidis 1/2. Continue on IV vancomycin  start date 06/26/2024 Surveillance blood cultures negative till date 06/26/2024 negative till date.  Anticipate 2 weeks of IV vancomycin  started on 06/26/2024. Appreciate ID's assistance. HD cath has been removed.  Hopefully IR to place tunneled catheter on 06/29/2024 she will get her vancomycin  once the dialysis catheter is placed and she gets her hemodialysis. Discharge on same day  End stage renal disease Monday Wednesday Friday : Did not had dialysis since 06/17/2024. With pulmonary edema. HD catheter removed on 06/26/2024. Nephrology was consulted status post HD Continue further management per nephrology  Right shoulder pain: Same side where her catheter was placed. X-ray of the shoulder showed no acute findings.  DM2 (diabetes mellitus, type 2) (HCC) Hemoglobin A1c was 4.1.  Essential hypertension Resume home Norvasc .  Elevated ammonia level: She was given 2 doses of lactulose. She has no evidence of cirrhosis on imaging.  Hypervolemic hyponatremia: Per dialysis.  Left thyroid  nodule: Follow-up PCP as an outpatient She does not have obesity   DVT prophylaxis: lovenox  Family  Communication:none Status is: Inpatient Remains inpatient appropriate because: Uremic encephalopathy    Code Status:     Code Status Orders  (From admission, onward)           Start     Ordered   06/24/24 1614  Full code  Continuous       Question:  By:  Answer:  Consent: discussion documented in EHR   06/24/24 1615           Code Status History     Date Active Date Inactive Code Status Order ID Comments User Context   01/03/2024 2031 01/16/2024 1540 Full Code [id number]  Arthea Child, MD ED   08/11/2023 0359 08/14/2023 1859 Full Code [id number]  Marcene Eva NOVAK, DO ED   07/31/2023 0838 08/05/2023 2111 Full Code [id number]  Claudene Maximino LABOR, MD ED   03/22/2023 1309 03/29/2023 1732 Full Code [id number]  Shona Terry SAILOR, DO ED   03/07/2023 2232 03/11/2023 2252 Full Code [id number]  Lonzell Emeline HERO, DO ED   11/01/2022 1031 11/07/2022 1847 Full Code [id number]  Waddell Rake, MD ED   07/22/2022 0031 07/22/2022 2059 Full Code [id number]  Waddell Rake, MD ED   05/09/2022 1010 05/11/2022 1758 Full Code [id number]  Leontine Elbe, DO ED   03/22/2022 1206 03/27/2022 1719 Full Code [id number]  Barbarann Nest, MD ED   03/10/2022 0636 03/17/2022 2253 Full Code [id number]  Alfornia Madison, MD ED   01/22/2022 1343 02/04/2022 1852 Full Code [id number]  Ricky Alfrieda DASEN, DO ED   12/31/2021 0204 01/03/2022 1745 Full Code [id number]  Laurence Locus, DO ED   12/20/2021 0459 12/24/2021 1952 Full Code [id number]  Marlin Lonni LABOR, MD ED   08/24/2014 1437 08/25/2014 0342 Full Code [id number]  Alona Corners, DO HOV  01/29/2014 2153 02/02/2014 1638 Full Code [id number]  Silvester Ales, MD Inpatient         IV Access:   Peripheral IV   Procedures and diagnostic studies:   DG Shoulder Right Result Date: 06/28/2024 CLINICAL DATA:  Right shoulder pain EXAM: DG SHOULDER 2+V*R* COMPARISON:  Reformats from chest CT 06/24/2024 FINDINGS: Subjective bony under mineralization. There is no evidence of fracture or dislocation.  Alignment and joint spaces are preserved. There is no evidence of arthropathy or other focal bone abnormality. Soft tissues are unremarkable. Healed right rib fractures IMPRESSION: No acute findings or explanation for pain. Electronically Signed   By: Andrea Gasman M.D.   On: 06/28/2024 11:38     Medical Consultants:   None.   Subjective:    Carol [name] feels better pain is improved.  Objective:    Vitals:   06/28/24 2000 06/29/24 0524 06/29/24 0729 06/29/24 0750  BP: 138/78 (!) 166/84 (!) 158/75   Pulse: 70 69 70   Resp: 18 18 18    Temp: 97.6 F (36.4 C) 97.9 F (36.6 C) 97.9 F (36.6 C)   TempSrc: Oral Oral Oral   SpO2: 99% 100% 100% 100%  Weight:      Height:       SpO2: 100 % O2 Flow Rate (L/min): 2 L/min   Intake/Output Summary (Last 24 hours) at 06/29/2024 0901 Last data filed at 06/29/2024 0527 Gross per 24 hour  Intake 240 ml  Output 0 ml  Net 240 ml   Filed Weights   06/24/24 2216 06/26/24 0851 06/26/24 1252  Weight: 62.2 kg 64.7 kg 62.2 kg    Exam: General exam: In no acute distress. Respiratory system: Good air movement and clear to auscultation. Cardiovascular system: S1 & S2 heard, RRR. No JVD. Gastrointestinal system: Abdomen is nondistended, soft and nontender.  Extremities: No pedal edema. Skin: No rashes, lesions or ulcers Psychiatry: Judgement and insight appear normal. Mood & affect appropriate.  Data Reviewed:    Labs: Basic Metabolic Panel: Recent Labs  Lab 06/24/24 1253 06/24/24 2233 06/25/24 0345 06/26/24 0906  NA 133*  --  132* 133*  K 4.1  --  3.4* 4.1  CL 93*  --  93* 92*  CO2 17*  --  23 24  GLUCOSE 51*  --  105* 89  BUN 67*  --  25* 29*  CREATININE 11.95* 6.41* 6.69* 7.66*  CALCIUM  8.8*  --  8.7* 8.4*  PHOS  --   --   --  5.4*   GFR Estimated Creatinine Clearance: 7.2 mL/min (A) (by C-G formula based on SCr of 7.66 mg/dL (H)). Liver Function Tests: Recent Labs  Lab 06/24/24 1253 06/26/24 0906   AST 32  --   ALT 14  --   ALKPHOS 162*  --   BILITOT 0.9  --   PROT 7.9  --   ALBUMIN  2.6* 2.2*   No results for input(s): LIPASE, AMYLASE in the last 168 hours. Recent Labs  Lab 06/24/24 1355  AMMONIA 70*   Coagulation profile No results for input(s): INR, PROTIME in the last 168 hours. COVID-19 Labs  No results for input(s): DDIMER, FERRITIN, LDH, CRP in the last 72 hours.  Lab Results  Component Value Date   SARSCOV2NAA NEGATIVE 06/24/2024   SARSCOV2NAA NEGATIVE 06/07/2024   SARSCOV2NAA NEGATIVE 01/03/2024   SARSCOV2NAA NEGATIVE 11/07/2023    CBC: Recent Labs  Lab 06/24/24 1253 06/24/24 2233 06/25/24 0345 06/26/24 0906  WBC 6.5 5.8 6.6 6.0  NEUTROABS 3.6  --   --   --   HGB 12.2 13.9 11.6* 11.1*  HCT 36.7 41.0 34.6* 33.4*  MCV 84.8 83.3 83.0 84.8  PLT 288 297 280 308   Cardiac Enzymes: Recent Labs  Lab 06/24/24 1358  CKTOTAL 249*   BNP (last 3 results) No results for input(s): PROBNP in the last 8760 hours. CBG: Recent Labs  Lab 06/28/24 0808 06/28/24 1103 06/28/24 1623 06/28/24 2034 06/29/24 0727  GLUCAP 100* 97 131* 124* 88   D-Dimer: No results for input(s): DDIMER in the last 72 hours. Hgb A1c: No results for input(s): HGBA1C in the last 72 hours.  Lipid Profile: No results for input(s): CHOL, HDL, LDLCALC, TRIG, CHOLHDL, LDLDIRECT in the last 72 hours. Thyroid  function studies: No results for input(s): TSH, T4TOTAL, T3FREE, THYROIDAB in the last 72 hours.  Invalid input(s): FREET3 Anemia work up: No results for input(s): VITAMINB12, FOLATE, FERRITIN, TIBC, IRON , RETICCTPCT in the last 72 hours. Sepsis Labs: Recent Labs  Lab 06/24/24 1253 06/24/24 1303 06/24/24 1633 06/24/24 2233 06/25/24 0345 06/26/24 0906  WBC 6.5  --   --  5.8 6.6 6.0  LATICACIDVEN  --  0.9 0.8  --   --   --    Microbiology Recent Results (from the past 240 hours)  Resp panel by RT-PCR (RSV, Flu  A&B, Covid) Anterior Nasal Swab     Status: None   Collection Time: 06/24/24 11:19 AM   Specimen: Anterior Nasal Swab  Result Value Ref Range Status   SARS Coronavirus 2 by RT PCR NEGATIVE NEGATIVE Final   Influenza A by PCR NEGATIVE NEGATIVE Final   Influenza B by PCR NEGATIVE NEGATIVE Final    Comment: (NOTE) The Xpert Xpress SARS-CoV-2/FLU/RSV plus assay is intended as an aid in the diagnosis of influenza from Nasopharyngeal swab specimens and should not be used as a sole basis for treatment. Nasal washings and aspirates are unacceptable for Xpert Xpress SARS-CoV-2/FLU/RSV testing.  Fact Sheet for Patients: bloggercourse.com  Fact Sheet for Healthcare Providers: seriousbroker.it  This test is not yet approved or cleared by the [place]  FDA and has been authorized for detection and/or diagnosis of SARS-CoV-2 by FDA under an Emergency Use Authorization (EUA). This EUA will remain in effect (meaning this test can be used) for the duration of the COVID-19 declaration under Section 564(b)(1) of the Act, 21 U.S.C. section 360bbb-3(b)(1), unless the authorization is terminated or revoked.     Resp Syncytial Virus by PCR NEGATIVE NEGATIVE Final    Comment: (NOTE) Fact Sheet for Patients: bloggercourse.com  Fact Sheet for Healthcare Providers: seriousbroker.it  This test is not yet approved or cleared by the [place]  FDA and has been authorized for detection and/or diagnosis of SARS-CoV-2 by FDA under an Emergency Use Authorization (EUA). This EUA will remain in effect (meaning this test can be used) for the duration of the COVID-19 declaration under Section 564(b)(1) of the Act, 21 U.S.C. section 360bbb-3(b)(1), unless the authorization is terminated or revoked.  Performed at [facility], [address], [name], [place] [address]   Blood culture (routine x 2)      Status: None   Collection Time: 06/24/24 11:36 AM   Specimen: BLOOD  Result Value Ref Range Status   Specimen Description BLOOD RIGHT ANTECUBITAL  Final   Special Requests   Final    BOTTLES DRAWN AEROBIC ONLY Blood Culture results may not be optimal due to an inadequate volume of blood received in culture  bottles   Culture   Final    NO GROWTH 5 DAYS Performed at [facility], [address] [place], [place] [address]    Report Status 06/29/2024 FINAL  Final  Blood culture (routine x 2)     Status: Abnormal   Collection Time: 06/24/24 11:41 AM   Specimen: BLOOD  Result Value Ref Range Status   Specimen Description BLOOD BLOOD RIGHT HAND  Final   Special Requests   Final    BOTTLES DRAWN AEROBIC ONLY Blood Culture results may not be optimal due to an inadequate volume of blood received in culture bottles   Culture  Setup Time   Final    GRAM POSITIVE COCCI AEROBIC BOTTLE ONLY RBV V BRYK PHARMD 06/26/2024 @ 0514 BY DD    Culture (A)  Final    STAPHYLOCOCCUS EPIDERMIDIS THE SIGNIFICANCE OF ISOLATING THIS ORGANISM FROM A SINGLE SET OF BLOOD CULTURES WHEN MULTIPLE SETS ARE DRAWN IS UNCERTAIN. PLEASE NOTIFY THE MICROBIOLOGY DEPARTMENT WITHIN ONE WEEK IF SPECIATION AND SENSITIVITIES ARE REQUIRED. Performed at [facility], [address], [place], [place] [address]    Report Status 06/27/2024 FINAL  Final  Blood Culture ID Panel (Reflexed)     Status: Abnormal   Collection Time: 06/24/24 11:41 AM  Result Value Ref Range Status   Enterococcus faecalis NOT DETECTED NOT DETECTED Final   Enterococcus Faecium NOT DETECTED NOT DETECTED Final   Listeria monocytogenes NOT DETECTED NOT DETECTED Final   Staphylococcus species DETECTED (A) NOT DETECTED Final    Comment: CRITICAL RESULT CALLED TO, READ BACK BY AND VERIFIED WITH: RBV V BRYK PHARMD 06/26/2024 @ 0514 BY DD    Staphylococcus aureus (BCID) NOT DETECTED NOT DETECTED Final   Staphylococcus epidermidis DETECTED (A) NOT  DETECTED Final    Comment: Methicillin (oxacillin) resistant coagulase negative staphylococcus. Possible blood culture contaminant (unless isolated from more than one blood culture draw or clinical case suggests pathogenicity). No antibiotic treatment is indicated for blood  culture contaminants. CRITICAL RESULT CALLED TO, READ BACK BY AND VERIFIED WITH: RBV V BRYK PHARMD 06/26/2024 @ 0514 BY DD    Staphylococcus lugdunensis NOT DETECTED NOT DETECTED Final   Streptococcus species NOT DETECTED NOT DETECTED Final   Streptococcus agalactiae NOT DETECTED NOT DETECTED Final   Streptococcus pneumoniae NOT DETECTED NOT DETECTED Final   Streptococcus pyogenes NOT DETECTED NOT DETECTED Final   A.calcoaceticus-baumannii NOT DETECTED NOT DETECTED Final   Bacteroides fragilis NOT DETECTED NOT DETECTED Final   Enterobacterales NOT DETECTED NOT DETECTED Final   Enterobacter cloacae complex NOT DETECTED NOT DETECTED Final   Escherichia coli NOT DETECTED NOT DETECTED Final   Klebsiella aerogenes NOT DETECTED NOT DETECTED Final   Klebsiella oxytoca NOT DETECTED NOT DETECTED Final   Klebsiella pneumoniae NOT DETECTED NOT DETECTED Final   Proteus species NOT DETECTED NOT DETECTED Final   Salmonella species NOT DETECTED NOT DETECTED Final   Serratia marcescens NOT DETECTED NOT DETECTED Final   Haemophilus influenzae NOT DETECTED NOT DETECTED Final   Neisseria meningitidis NOT DETECTED NOT DETECTED Final   Pseudomonas aeruginosa NOT DETECTED NOT DETECTED Final   Stenotrophomonas maltophilia NOT DETECTED NOT DETECTED Final   Candida albicans NOT DETECTED NOT DETECTED Final   Candida auris NOT DETECTED NOT DETECTED Final   Candida glabrata NOT DETECTED NOT DETECTED Final   Candida krusei NOT DETECTED NOT DETECTED Final   Candida parapsilosis NOT DETECTED NOT DETECTED Final   Candida tropicalis NOT DETECTED NOT DETECTED Final  Cryptococcus neoformans/gattii NOT DETECTED NOT DETECTED Final   Methicillin  resistance mecA/C DETECTED (A) NOT DETECTED Final    Comment: CRITICAL RESULT CALLED TO, READ BACK BY AND VERIFIED WITH: RBV V BRYK PHARMD 06/26/2024 @ 0514 BY DD Performed at [facility], [address], [place], [place] [address]   MRSA Next Gen by PCR, Nasal     Status: None   Collection Time: 06/24/24 10:39 PM   Specimen: Nasal Mucosa; Nasal Swab  Result Value Ref Range Status   MRSA by PCR Next Gen NOT DETECTED NOT DETECTED Final    Comment: (NOTE) The GeneXpert MRSA Assay (FDA approved for NASAL specimens only), is one component of a comprehensive MRSA colonization surveillance program. It is not intended to diagnose MRSA infection nor to guide or monitor treatment for MRSA infections. Test performance is not FDA approved in patients less than 41 years old. Performed at [facility], [address], [place], [place] [address]   Culture, blood (Routine X 2) w Reflex to ID Panel     Status: None (Preliminary result)   Collection Time: 06/26/24  8:45 PM   Specimen: BLOOD RIGHT ARM  Result Value Ref Range Status   Specimen Description BLOOD RIGHT ARM  Final   Special Requests   Final    BOTTLES DRAWN AEROBIC AND ANAEROBIC Blood Culture adequate volume   Culture   Final    NO GROWTH 3 DAYS Performed at [facility], [address], [place], [place] [address]    Report Status PENDING  Incomplete  Culture, blood (Routine X 2) w Reflex to ID Panel     Status: None (Preliminary result)   Collection Time: 06/26/24  8:45 PM   Specimen: BLOOD RIGHT HAND  Result Value Ref Range Status   Specimen Description BLOOD RIGHT HAND  Final   Special Requests   Final    BOTTLES DRAWN AEROBIC ONLY Blood Culture results may not be optimal due to an inadequate volume of blood received in culture bottles   Culture   Final    NO GROWTH 3 DAYS Performed at [facility], [address] [place], [place] [address]    Report Status PENDING  Incomplete     Medications:     amLODipine   10 mg Oral Daily   aspirin  EC  81 mg Oral Daily   atorvastatin   80 mg Oral Daily   Chlorhexidine  Gluconate Cloth  6 each Topical Daily   fluticasone  furoate-vilanterol  1 puff Inhalation Daily   heparin   5,000 Units Subcutaneous Q8H   naphazoline-pheniramine  2 drop Both Eyes BID   pantoprazole   40 mg Oral Q1200   sodium chloride  flush  10-40 mL Intracatheter Q12H   sodium chloride  flush  3 mL Intravenous Q12H   vancomycin  variable dose per unstable renal function (pharmacist dosing)   Does not apply See admin instructions   Continuous Infusions:      LOS: 5 days   Erle Odell Castor  [facility]  06/29/2024, 9:01 AM

## 2024-06-30 DIAGNOSIS — N186 End stage renal disease: Secondary | ICD-10-CM | POA: Diagnosis not present

## 2024-06-30 DIAGNOSIS — B957 Other staphylococcus as the cause of diseases classified elsewhere: Secondary | ICD-10-CM | POA: Diagnosis not present

## 2024-06-30 DIAGNOSIS — R7881 Bacteremia: Secondary | ICD-10-CM | POA: Diagnosis not present

## 2024-06-30 LAB — GLUCOSE, CAPILLARY
Glucose-Capillary: 134 mg/dL — ABNORMAL HIGH (ref 70–99)
Glucose-Capillary: 103 mg/dL — ABNORMAL HIGH (ref 70–99)

## 2024-06-30 MED ORDER — VANCOMYCIN HCL 750 MG/150ML IV SOLN: 750.0000 mg | INTRAVENOUS | Status: AC

## 2024-06-30 MED ORDER — VANCOMYCIN HCL 750 MG/150ML IV SOLN
750.0000 mg | Freq: Once | INTRAVENOUS | Status: AC
  Administered 2024-06-30: 750 mg via INTRAVENOUS
  Filled 2024-06-30: qty 150

## 2024-06-30 NOTE — Progress Notes (Signed)
 D/C order noted. Contacted [facility] to be advised of pt's d/c today and that pt should resume care tomorrow. Clinic advised that renal PA has completed orders for iv abx with HD at d/c.   Randine Mungo Dialysis Navigator [phone]

## 2024-06-30 NOTE — Progress Notes (Signed)
 Missed vanc dose post HD 11/10. Will give 750mg  IV x1 today then resume MWF.  Sergio Batch, PharmD, BCIDP, AAHIVP, CPP Infectious Disease Pharmacist 06/30/2024 8:37 AM

## 2024-06-30 NOTE — TOC Transition Note (Signed)
 Transition of Care [facility]) - Discharge Note   Patient Details  Name: Carol [name] MRN: [id number] Date of Birth: 03-19-1961  [facility]) CM/SW Contact:  Tom-Johnson, Harvest Muskrat, RN Phone Number: 06/30/2024, 10:11 AM   Clinical Narrative:     Patient is scheduled for discharge today.  Readmission Risk Assessment done. Hospital f/u and discharge instructions on AVS. No ICM needs or recommendations noted. Daughter, Jesusa to transport at discharge.  No further ICM needs noted.     Final next level of care: Home/Self Care Barriers to Discharge: Barriers Resolved   Patient Goals and CMS Choice Patient states their goals for this hospitalization and ongoing recovery are:: To be able to cook more CMS Medicare.gov Compare Post Acute Care list provided to:: Patient Choice offered to / list presented to : NA      Discharge Placement                Patient to be transferred to facility by: Daughter Name of family member notified: [facility]    Discharge Plan and Services Additional resources added to the After Visit Summary for   In-house Referral: Clinical Social Work              DME Arranged: N/A DME Agency: NA       HH Arranged: NA HH Agency: NA        Social Drivers of Health (SDOH) Interventions SDOH Screenings   Food Insecurity: No Food Insecurity (01/03/2024)  Housing: Low Risk  (01/03/2024)  Transportation Needs: No Transportation Needs (01/03/2024)  Utilities: Not At Risk (01/03/2024)  Financial Resource Strain: Low Risk  (04/04/2022)  Tobacco Use: High Risk (06/24/2024)     Readmission Risk Interventions    06/30/2024   10:10 AM 01/14/2024    9:39 AM 08/02/2023    5:29 PM  Readmission Risk Prevention Plan  Transportation Screening Complete Complete Complete  Medication Review (RN Care Manager) Referral to Pharmacy Complete Complete  PCP or Specialist appointment within 3-5 days of discharge Complete Complete   HRI or  Home Care Consult Complete Complete Complete  SW Recovery Care/Counseling Consult Complete Complete   Palliative Care Screening Not Applicable Not Applicable Not Applicable  Skilled Nursing Facility Not Applicable Not Applicable Not Applicable

## 2024-06-30 NOTE — Discharge Planning (Signed)
 [facility] Patient Discharge Orders - [facility] CLINIC: [place]  Patient's name: Carol [name] Admit/DC Dates: 06/24/2024 - 06/30/24  DISCHARGE DIAGNOSES: AMS - multifactorial (uremia + hepatic encephalopathy + bacteremia) MRSE bacteremia - s/p line holiday, on IV abx Hepatic encephalopathy - ammonia high, lactulose given during admit R shoulder pain - xray negative, improved with removal of prior TDC L thyroid  nodule on imaging - needs f/u as outpatient  HD ORDER CHANGES: Heparin  change: no EDW Change: YES New EDW: 63.5kg [place] Change: no  ANEMIA MANAGEMENT: Aranesp : Given: no    ESA dose for discharge: Mircera per protocol IV Iron  dose at discharge: per protocol Transfusion: Given: no  BONE/MINERAL MEDICATIONS: Hectorol/Calcitriol  change: no Sensipar/Parsabiv change: no  ACCESS INTERVENTION/CHANGE: yes Details: Old TDC removed 11/7, new R TDC placed 06/29/24 by IR   RECENT LABS: Recent Labs  Lab 06/26/24 0906  HGB 11.1*  NA 133*  K 4.1  CALCIUM  8.4*  PHOS 5.4*  ALBUMIN  2.2*    IV ANTIBIOTICS: YES - Vancomycin  750mg  IV q HD until 07/10/24 Details:  OTHER ANTICOAGULATION: no Details:  OTHER/APPTS/LAB ORDERS:  - Pls review d/c summary with patient - calcitriol  should be with HD only  - Pls refer back to [facility] AFTER done with abx for 2nd stage surgery to L AVF  -   D/C Meds to be reconciled by nurse after every discharge.  Completed By: Izetta Boehringer, PA-C [facility] Pager [phone]   Reviewed by: MD:______ RN_______

## 2024-06-30 NOTE — Discharge Summary (Addendum)
 Physician Discharge Summary  Lena Gores FMW:[id number] DOB: 12-May-1961 DOA: 06/24/2024  PCP: Doristine, [facility]  Admit date: 06/24/2024 Discharge date: 06/30/2024  Admitted From: Home Disposition:  home  Recommendations for Outpatient Follow-up:  Follow up with PCP in 1-2 weeks Please obtain BMP/CBC in one week   Home Health:No Equipment/Devices:None  Discharge Condition:Stable CODE STATUS:Full Diet recommendation: Heart Healthy   Brief/Interim Summary: 63 y.o. female past medical history diabetes mellitus essential hypertension, chronic diastolic dysfunction end-stage renal disease on hemodialysis, polysubstance abuse  (including cocaine  tobacco and alcohol), recently discharged from the hospital for sepsis secondary to fungemia and Burkholderia bacteremia who was brought in by her fianc for confusion on the day of admission.  He relates she has not had dialysis since 10/29 .   Discharge Diagnoses:  Principal Problem:   Uremia Active Problems:   End stage renal disease (HCC)   DM2 (diabetes mellitus, type 2) (HCC)   Essential hypertension   Uremic encephalopathy   Class 2 obesity due to excess calories with body mass index (BMI) of 37.0 to 37.9 in adult  Staph epidermidis bacteremia/Bacteremia due to tunneled catheter  leading to metabolic encephalopathy: 1 out of 2 blood cultures grew Staph epidermidis she was complaining of catheter site pain no erythema or purulent drainage. She was started on IV bank on 06/26/2024. Surveillance blood cultures remain negative till date. Cardiac cath was removed she was given a line holiday. Replaced on 06/29/2024 by interventional radiology. Infectious ease was consulted which agreed with plan. Show continue IV Vanco for 2 weeks start date is 06/26/2024.  End-stage renal disease on hemodialysis Monday Wednesday and Friday: Nephrology was consulted she was continued on her regular days.  Right shoulder pain: X-ray showed no  acute findings.  Diabetes mellitus type 2: She is probably no longer a diabetic, her A1c was 4.1 she required no insulins.  Essential hypertension: No changes made to her medication continue Norvasc .  Hypovolemic hyponatremia: Resolved with dialysis.  Left thyroid  nodule: Follow-up PCP as an outpatient.  Discharge Instructions  Discharge Instructions     Diet - low sodium heart healthy   Complete by: As directed    Increase activity slowly   Complete by: As directed       Allergies as of 06/30/2024       Reactions   Penicillins Shortness Of Breath, Other (See Comments)   Caused yeast infection   Ultram  [tramadol ] Other (See Comments)   Made her tongue raw        Medication List     TAKE these medications    albuterol  108 (90 Base) MCG/ACT inhaler Commonly known as: VENTOLIN  HFA Inhale 2 puffs into the lungs every 4 (four) hours as needed for wheezing or shortness of breath.   amLODipine  10 MG tablet Commonly known as: NORVASC  Take 1 tablet (10 mg total) by mouth daily.   Aspirin  Low Dose 81 MG tablet Generic drug: aspirin  EC Take 1 tablet (81 mg total) by mouth daily. Swallow whole.   atorvastatin  80 MG tablet Commonly known as: LIPITOR  Take 1 tablet (80 mg total) by mouth daily.   budesonide -formoterol  160-4.5 MCG/ACT inhaler Commonly known as: SYMBICORT Inhale 2 puffs into the lungs in the morning and at bedtime.   busPIRone 10 MG tablet Commonly known as: BUSPAR SMARTSIG:1 Tablet(s) By Mouth Every 12 Hours   calcitRIOL  0.25 MCG capsule Commonly known as: ROCALTROL  Take 0.25 mcg by mouth daily.   carvedilol 6.25 MG tablet Commonly known as: COREG Take  6.25 mg by mouth 2 (two) times daily.   cetirizine 10 MG tablet Commonly known as: ZYRTEC Take 10 mg by mouth daily as needed for allergies.   Darbepoetin Alfa  60 MCG/0.3ML Sosy injection Commonly known as: ARANESP  Inject 0.3 mLs (60 mcg total) into the skin every Thursday at 6pm.    Eszopiclone 3 MG Tabs Take 3 mg by mouth at bedtime. Take immediately before bedtime   famotidine  20 MG tablet Commonly known as: PEPCID  Take 20 mg by mouth every morning.   gabapentin  100 MG capsule Commonly known as: NEURONTIN  Take 1 capsule (100 mg total) by mouth 2 (two) times daily.   glucagon  1 MG injection Follow package directions for low blood sugar.   hydrOXYzine  25 MG tablet Commonly known as: ATARAX  Take 25 mg by mouth 2 (two) times daily as needed.   lidocaine  5 % Commonly known as: Lidoderm  Place 1 patch onto the skin daily. Remove & Discard patch within 12 hours or as directed by MD   losartan  50 MG tablet Commonly known as: COZAAR  Take 50 mg by mouth daily.   naloxone  4 MG/0.1ML Liqd nasal spray kit Commonly known as: NARCAN  If concern for opioid overdose, spray in nostril. Call 911.   naphazoline-pheniramine 0.025-0.3 % ophthalmic solution Commonly known as: NAPHCON-A Place 2 drops into both eyes 2 (two) times daily.   nitroGLYCERIN  0.4 MG SL tablet Commonly known as: NITROSTAT  Place 1 tablet (0.4 mg total) under the tongue every 5 (five) minutes as needed for chest pain.   oxyCODONE -acetaminophen  5-325 MG tablet Commonly known as: PERCOCET/ROXICET Take 1 tablet by mouth every 8 (eight) hours as needed for severe pain (pain score 7-10).   pantoprazole  40 MG tablet Commonly known as: PROTONIX  Take 1 tablet (40 mg total) by mouth daily at 12 noon. What changed: when to take this   promethazine -dextromethorphan 6.25-15 MG/5ML syrup Commonly known as: PROMETHAZINE -DM Take 5 mLs by mouth 4 (four) times daily as needed for cough.   QUEtiapine  300 MG 24 hr tablet Commonly known as: SEROQUEL  XR SMARTSIG:1 Tablet(s) By Mouth Every Evening   sertraline  50 MG tablet Commonly known as: ZOLOFT  Take 50 mg by mouth every morning.   torsemide  100 MG tablet Commonly known as: DEMADEX  Take 100 mg by mouth 2 (two) times daily.   triamcinolone  ointment  0.1 % Commonly known as: KENALOG  1 Application 2 (two) times daily as needed.   vancomycin  750 MG/150ML Soln Commonly known as: VANCOREADY Inject 150 mLs (750 mg total) into the vein every Monday, Wednesday, and Friday with hemodialysis.        Allergies  Allergen Reactions   Penicillins Shortness Of Breath and Other (See Comments)    Caused yeast infection   Ultram  [Tramadol ] Other (See Comments)    Made her tongue raw    Consultations: Nephrology Infectious disease   Procedures/Studies: IR TUNNELED CENTRAL VENOUS CATH [facility] W IMG Result Date: 06/29/2024 INDICATION: In stage renal disease requiring hemodialysis. Durable venous access placement for therapy. EXAM: TUNNELED hemodialysis catheter WITH ULTRASOUND AND FLUOROSCOPIC GUIDANCE MEDICATIONS: The antibiotic was given in an appropriate time interval prior to skin puncture. ANESTHESIA/SEDATION: Moderate (conscious) sedation was employed during this procedure. A total of Versed  see medical record mg and Fentanyl  see medical record mcg was administered intravenously by the radiology nurse. Total intra-service moderate Sedation Time: See medical record minutes. The patient's level of consciousness and vital signs were monitored continuously by radiology nursing throughout the procedure under my direct supervision. FLUOROSCOPY: Radiation Exposure  Index (as provided by the fluoroscopic device): See medical record mGy Kerma COMPLICATIONS: None immediate. PROCEDURE: Informed written consent was obtained from the patient after a discussion of the risks, benefits, and alternatives to treatment. Questions regarding the procedure were encouraged and answered. The right neck and chest were prepped with chlorhexidine  in a sterile fashion, and a sterile drape was applied covering the operative field. Maximum barrier sterile technique with sterile gowns and gloves were used for the procedure. A timeout was performed prior to the initiation of the  procedure. After creating a small venotomy incision, a micropuncture kit was utilized to access the right internal jugular vein under direct, real-time ultrasound guidance after the overlying soft tissues were anesthetized with 1% lidocaine  with epinephrine . Ultrasound image documentation was performed. The microwire was kinked to measure appropriate catheter length. The micropuncture sheath was exchanged for a peel-away sheath over a guidewire. A 23 cm palindrome tunneled hemodialysis catheter was tunneled in a retrograde fashion from the anterior chest wall to the venotomy incision. The catheter was then placed through the peel-away sheath with tip ultimately positioned at the superior caval-atrial junction. Final catheter positioning was confirmed and documented with a spot radiographic image. The catheter aspirates and flushes normally. The catheter was flushed with appropriate volume heparin  dwells. The catheter exit site was secured with a 0-Prolene retention suture. The venotomy incision was closed with an interrupted 4-0 Vicryl, Dermabond and Steri-strips. Dressings were applied. The patient tolerated the procedure well without immediate post procedural complication. FINDINGS: After catheter placement, the tip lies within the superior cavoatrial junction. The catheter aspirates and flushes normally and is ready for immediate use. IMPRESSION: Successful placement of 23 cm palindrome hemodialysis catheter via the right internal jugular vein with tip terminating at the superior caval atrial junction. The catheter is ready for immediate use. Electronically Signed   By: Cordella Banner   On: 06/29/2024 20:17   DG Shoulder Right Result Date: 06/28/2024 CLINICAL DATA:  Right shoulder pain EXAM: DG SHOULDER 2+V*R* COMPARISON:  Reformats from chest CT 06/24/2024 FINDINGS: Subjective bony under mineralization. There is no evidence of fracture or dislocation. Alignment and joint spaces are preserved. There is no  evidence of arthropathy or other focal bone abnormality. Soft tissues are unremarkable. Healed right rib fractures IMPRESSION: No acute findings or explanation for pain. Electronically Signed   By: Andrea Gasman M.D.   On: 06/28/2024 11:38   ECHOCARDIOGRAM COMPLETE Result Date: 06/26/2024    ECHOCARDIOGRAM REPORT   Patient Name:   SHANDEL BUSIC Date of Exam: 06/26/2024 Medical Rec #:  [id number]               Height:       66.0 in Accession #:    [id number]              Weight:       137.1 lb Date of Birth:  Feb 20, 1961               BSA:          1.703 m Patient Age:    63 years                BP:           151/78 mmHg Patient Gender: F                       HR:           74 bpm. Exam Location:  Inpatient Procedure: 2D Echo, Cardiac Doppler and Color Doppler (Both Spectral and Color            Flow Doppler were utilized during procedure). Indications:    Dyspnea R06.00  History:        Patient has prior history of Echocardiogram examinations, most                 recent 01/05/2024. CHF, CAD, COPD and CKD, stage 4,                 Signs/Symptoms:Chest Pain; Risk Factors:Hypertension and                 Diabetes.  Sonographer:    Thea Norlander RCS Referring Phys: [phone] Sahvanna Mcmanigal FELIZ ORTIZ IMPRESSIONS  1. Systolic anterior motion of mitral valve. . Left ventricular ejection fraction, by estimation, is >75%. The left ventricle has hyperdynamic function. The left ventricle has no regional wall motion abnormalities. Left ventricular diastolic parameters were normal.  2. Right ventricular systolic function is normal. The right ventricular size is normal. Tricuspid regurgitation signal is inadequate for assessing PA pressure.  3. The mitral valve is degenerative. No evidence of mitral valve regurgitation. No evidence of mitral stenosis.  4. The aortic valve is tricuspid. Aortic valve regurgitation is not visualized. No aortic stenosis is present.  5. The inferior vena cava is normal in size with greater than  50% respiratory variability, suggesting right atrial pressure of 3 mmHg. Comparison(s): A prior study was performed on 01/09/2024. LVEF 70-75%, mild biatrial enlargement, mild MR. FINDINGS  Left Ventricle: Systolic anterior motion of mitral valve. Left ventricular ejection fraction, by estimation, is >75%. The left ventricle has hyperdynamic function. The left ventricle has no regional wall motion abnormalities. The left ventricular internal cavity size was normal in size. There is borderline left ventricular hypertrophy of the basal-septal segment. Left ventricular diastolic parameters were normal. Right Ventricle: The right ventricular size is normal. No increase in right ventricular wall thickness. Right ventricular systolic function is normal. Tricuspid regurgitation signal is inadequate for assessing PA pressure. Left Atrium: Left atrial size was normal in size. Right Atrium: Right atrial size was normal in size. Pericardium: There is no evidence of pericardial effusion. Mitral Valve: The mitral valve is degenerative in appearance. No evidence of mitral valve regurgitation. No evidence of mitral valve stenosis. Tricuspid Valve: The tricuspid valve is grossly normal. Tricuspid valve regurgitation is trivial. No evidence of tricuspid stenosis. Aortic Valve: The aortic valve is tricuspid. Aortic valve regurgitation is not visualized. No aortic stenosis is present. Aortic valve peak gradient measures 8.5 mmHg. Pulmonic Valve: The pulmonic valve was not well visualized. Pulmonic valve regurgitation is not visualized. Aorta: The aortic root and ascending aorta are structurally normal, with no evidence of dilitation. Venous: The inferior vena cava is normal in size with greater than 50% respiratory variability, suggesting right atrial pressure of 3 mmHg. IAS/Shunts: The atrial septum is grossly normal.  LEFT VENTRICLE PLAX 2D LVIDd:         4.00 cm   Diastology LVIDs:         2.10 cm   LV e' medial:    6.06 cm/s LV  PW:         0.90 cm   LV E/e' medial:  11.6 LV IVS:        0.70 cm   LV e' lateral:   4.79 cm/s LVOT diam:     2.00 cm   LV E/e' lateral: 14.7 LV  SV:         57 LV SV Index:   33 LVOT Area:     3.14 cm  RIGHT VENTRICLE             IVC RV S prime:     14.70 cm/s  IVC diam: 1.20 cm TAPSE (M-mode): 1.8 cm LEFT ATRIUM             Index        RIGHT ATRIUM           Index LA diam:        3.70 cm 2.17 cm/m   RA Area:     15.90 cm LA Vol (A2C):   30.6 ml 17.96 ml/m  RA Volume:   41.80 ml  24.54 ml/m LA Vol (A4C):   21.6 ml 12.68 ml/m LA Biplane Vol: 25.8 ml 15.15 ml/m  AORTIC VALVE AV Area (Vmax): 2.28 cm AV Vmax:        146.00 cm/s AV Peak Grad:   8.5 mmHg LVOT Vmax:      106.00 cm/s LVOT Vmean:     69.700 cm/s LVOT VTI:       0.180 m  AORTA Ao Root diam: 3.00 cm Ao Asc diam:  3.10 cm MITRAL VALVE MV Area (PHT): 3.91 cm    SHUNTS MV Decel Time: 194 msec    Systemic VTI:  0.18 m MV E velocity: 70.20 cm/s  Systemic Diam: 2.00 cm MV A velocity: 96.40 cm/s MV E/A ratio:  0.73 Sunit Tolia Electronically signed by Madonna Large Signature Date/Time: 06/26/2024/6:01:36 PM    Final    IR Removal Tun Cv Cath W/O FL Result Date: 06/26/2024 INDICATION: History of ESRD requiring dialysis. Now with bacteremia. Request for tunneled dialysis catheter removal. EXAM: REMOVAL TUNNELED CENTRAL VENOUS CATHETER MEDICATIONS: 8 mL 1% lidocaine  ANESTHESIA/SEDATION: None FLUOROSCOPY: None COMPLICATIONS: None immediate. PROCEDURE: Informed written consent was obtained from the patient after a thorough discussion of the procedural risks, benefits and alternatives. All questions were addressed. Maximal Sterile Barrier Technique was utilized including caps, mask, sterile gowns, sterile gloves, sterile drape, hand hygiene and skin antiseptic. A timeout was performed prior to the initiation of the procedure. The patient's right chest and catheter was prepped and draped in a normal sterile fashion. Heparin  was removed from both ports of  catheter. 1% lidocaine  was used for local anesthesia. Using gentle blunt dissection the cuff of the catheter was exposed and the catheter was removed in it's entirety. Pressure was held till hemostasis was obtained. A sterile dressing was applied. The patient tolerated the procedure well with no immediate complications. IMPRESSION: Successful catheter removal as described above. Performed by: Wyatt Pommier, PA-C Electronically Signed   By: Juliene Balder M.D.   On: 06/26/2024 17:17   CT CHEST ABDOMEN PELVIS W CONTRAST Result Date: 06/24/2024 EXAM: CT CHEST, ABDOMEN AND PELVIS WITH CONTRAST 06/24/2024 03:50:00 PM TECHNIQUE: CT of the chest, abdomen and pelvis was performed with the administration of 75 mL of iohexol  (OMNIPAQUE ) 350 MG/ML injection. Multiplanar reformatted images are provided for review. Automated exposure control, iterative reconstruction, and/or weight based adjustment of the mA/kV was utilized to reduce the radiation dose to as low as reasonably achievable. COMPARISON: None available. CLINICAL HISTORY: AMS FINDINGS: CHEST: MEDIASTINUM AND LYMPH NODES: Heart and pericardium demonstrate mild cardiomegaly. Aortic atherosclerosis is present. A 3 cm left thyroid  mass is noted. The central airways are clear. No mediastinal, hilar or axillary lymphadenopathy. LUNGS AND PLEURA: Minimal bilateral posterior basilar septum  atelectasis. No focal consolidation or pulmonary edema. No pleural effusion or pneumothorax. ABDOMEN AND PELVIS: LIVER: The liver is unremarkable. GALLBLADDER AND BILE DUCTS: Status post cholecystectomy. No biliary ductal dilatation. SPLEEN: No acute abnormality. PANCREAS: No acute abnormality. ADRENAL GLANDS: No acute abnormality. KIDNEYS, URETERS AND BLADDER: No stones in the kidneys or ureters. No hydronephrosis. No perinephric or periureteral stranding. Urinary bladder is unremarkable. GI AND BOWEL: Stomach demonstrates no acute abnormality. There is no bowel obstruction. REPRODUCTIVE  ORGANS: No acute abnormality. PERITONEUM AND RETROPERITONEUM: No ascites. No free air. VASCULATURE: Aorta is normal in caliber. ABDOMINAL AND PELVIS LYMPH NODES: No lymphadenopathy. BONES AND SOFT TISSUES: Old bilateral rib fractures are noted. No acute osseous abnormality. No focal soft tissue abnormality. IMPRESSION: 1. Mild cardiomegaly. 2. Aortic atherosclerosis. 3. Left thyroid  nodule/mass measuring approximately 3 cm; recommend non-emergent thyroid  ultrasound. 4. Old bilateral rib fractures. 5. Minimal posterior basilar atelectasis. 6. Status post cholecystectomy. Electronically signed by: Lynwood Seip MD 06/24/2024 04:06 PM EST RP Workstation: [id number]   DG Chest Port 1 View Result Date: 06/24/2024 CLINICAL DATA:  Cough. EXAM: PORTABLE CHEST 1 VIEW COMPARISON:  Chest radiograph dated 06/07/2026. FINDINGS: Right-sided dialysis catheter in similar position. No focal consolidation, pleural effusion or pneumothorax. There is cardiomegaly with vascular congestion. No acute osseous pathology. IMPRESSION: Cardiomegaly with vascular congestion. No focal consolidation. Electronically Signed   By: Vanetta Chou M.D.   On: 06/24/2024 14:57   CT Head Wo Contrast Result Date: 06/24/2024 CLINICAL DATA:  Mental status change. EXAM: CT HEAD WITHOUT CONTRAST TECHNIQUE: Contiguous axial images were obtained from the base of the skull through the vertex without intravenous contrast. RADIATION DOSE REDUCTION: This exam was performed according to the departmental dose-optimization program which includes automated exposure control, adjustment of the mA and/or kV according to patient size and/or use of iterative reconstruction technique. COMPARISON:  06/07/2024 FINDINGS: Brain: Ventricles, cisterns and other CSF spaces are normal. Possible small old lacunar infarct over the anterior limb left internal capsule unchanged. 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 No mass, mass effect, shift of midline structures or  acute hemorrhage. No evidence of acute infarction. Vascular: No hyperdense vessel or unexpected calcification. Skull: Normal. Negative for fracture or focal lesion. Sinuses/Orbits: Visualized orbits are normal. Paranasal sinuses are clear. There is hypoplastic frontal sinuses. Other: None. IMPRESSION: 1. No acute findings. 2. 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 Possible small old lacunar infarct over the anterior limb left internal capsule unchanged. Electronically Signed   By: Toribio Agreste M.D.   On: 06/24/2024 14:09   CT Head Wo Contrast Result Date: 06/07/2024 CLINICAL DATA:  Altered mental status.  Tremors. EXAM: CT HEAD WITHOUT CONTRAST TECHNIQUE: Contiguous axial images were obtained from the base of the skull through the vertex without intravenous contrast. RADIATION DOSE REDUCTION: This exam was performed according to the departmental dose-optimization program which includes automated exposure control, adjustment of the mA and/or kV according to patient size and/or use of iterative reconstruction technique. COMPARISON:  08/11/2023 FINDINGS: Brain: No evidence of intracranial hemorrhage, acute infarction, hydrocephalus, extra-axial collection, or mass lesion/mass effect. 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 Vascular:  No hyperdense vessel or other acute findings. Skull: No evidence of fracture or other significant bone abnormality. Sinuses/Orbits:  No acute findings. Other: None. IMPRESSION: No acute intracranial abnormality. 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 Electronically Signed   By: Norleen DELENA Graham CHRISTELLA.D.  On: 06/07/2024 18:06   DG Chest Portable 1 View Result Date: 06/07/2024 CLINICAL DATA:  Weakness. EXAM: PORTABLE CHEST 1 VIEW COMPARISON:  04/26/2024 FINDINGS: Right-sided dialysis catheter remains in place. Cardiomegaly is stable. Mediastinal  contours are unchanged. Vascular congestion with subtle septal thickening at the bases suspicious for pulmonary edema. No confluent opacity, large pleural effusion or pneumothorax. The bones are subjectively under mineralized IMPRESSION: Cardiomegaly with vascular congestion and probable mild pulmonary edema. Electronically Signed   By: Andrea Gasman M.D.   On: 06/07/2024 16:14   (Echo, Carotid, EGD, Colonoscopy, ERCP)    Subjective: No complaints catheter site is not painful  Discharge Exam: Vitals:   06/30/24 0431 06/30/24 0801  BP: (!) 140/74 122/72  Pulse: 78 78  Resp: 18 19  Temp: 98.4 F (36.9 C) 98.9 F (37.2 C)  SpO2: 97% 97%   Vitals:   06/29/24 2115 06/30/24 0000 06/30/24 0431 06/30/24 0801  BP: (!) 127/95 (!) 146/86 (!) 140/74 122/72  Pulse: 83 79 78 78  Resp: (!) 24  18 19   Temp: 98.1 F (36.7 C) 98.1 F (36.7 C) 98.4 F (36.9 C) 98.9 F (37.2 C)  TempSrc: Axillary Oral  Oral  SpO2: 99% 98% 97% 97%  Weight: 63.5 kg     Height:        General: Pt is alert, awake, not in acute distress Cardiovascular: RRR, S1/S2 +, no rubs, no gallops Respiratory: CTA bilaterally, no wheezing, no rhonchi Abdominal: Soft, NT, ND, bowel sounds + Extremities: no edema, no cyanosis    The results of significant diagnostics from this hospitalization (including imaging, microbiology, ancillary and laboratory) are listed below for reference.     Microbiology: Recent Results (from the past 240 hours)  Resp panel by RT-PCR (RSV, Flu A&B, Covid) Anterior Nasal Swab     Status: None   Collection Time: 06/24/24 11:19 AM   Specimen: Anterior Nasal Swab  Result Value Ref Range Status   SARS Coronavirus 2 by RT PCR NEGATIVE NEGATIVE Final   Influenza A by PCR NEGATIVE NEGATIVE Final   Influenza B by PCR NEGATIVE NEGATIVE Final    Comment: (NOTE) The Xpert Xpress SARS-CoV-2/FLU/RSV plus assay is intended as an aid in the diagnosis of influenza from Nasopharyngeal swab specimens  and should not be used as a sole basis for treatment. Nasal washings and aspirates are unacceptable for Xpert Xpress SARS-CoV-2/FLU/RSV testing.  Fact Sheet for Patients: bloggercourse.com  Fact Sheet for Healthcare Providers: seriousbroker.it  This test is not yet approved or cleared by the [place]  FDA and has been authorized for detection and/or diagnosis of SARS-CoV-2 by FDA under an Emergency Use Authorization (EUA). This EUA will remain in effect (meaning this test can be used) for the duration of the COVID-19 declaration under Section 564(b)(1) of the Act, 21 U.S.C. section 360bbb-3(b)(1), unless the authorization is terminated or revoked.     Resp Syncytial Virus by PCR NEGATIVE NEGATIVE Final    Comment: (NOTE) Fact Sheet for Patients: bloggercourse.com  Fact Sheet for Healthcare Providers: seriousbroker.it  This test is not yet approved or cleared by the [place]  FDA and has been authorized for detection and/or diagnosis of SARS-CoV-2 by FDA under an Emergency Use Authorization (EUA). This EUA will remain in effect (meaning this test can be used) for the duration of the COVID-19 declaration under Section 564(b)(1) of the Act, 21 U.S.C. section 360bbb-3(b)(1), unless the authorization is terminated or revoked.  Performed at [facility], [address],  [place], [place] [address]   Blood culture (routine x 2)     Status: None   Collection Time: 06/24/24 11:36 AM   Specimen: BLOOD  Result Value Ref Range Status   Specimen Description BLOOD RIGHT ANTECUBITAL  Final   Special Requests   Final    BOTTLES DRAWN AEROBIC ONLY Blood Culture results may not be optimal due to an inadequate volume of blood received in culture bottles   Culture   Final    NO GROWTH 5 DAYS Performed at [facility], [address] [place], [place] [address]    Report  Status 06/29/2024 FINAL  Final  Blood culture (routine x 2)     Status: Abnormal   Collection Time: 06/24/24 11:41 AM   Specimen: BLOOD  Result Value Ref Range Status   Specimen Description BLOOD BLOOD RIGHT HAND  Final   Special Requests   Final    BOTTLES DRAWN AEROBIC ONLY Blood Culture results may not be optimal due to an inadequate volume of blood received in culture bottles   Culture  Setup Time   Final    GRAM POSITIVE COCCI AEROBIC BOTTLE ONLY RBV V BRYK PHARMD 06/26/2024 @ 0514 BY DD    Culture (A)  Final    STAPHYLOCOCCUS EPIDERMIDIS THE SIGNIFICANCE OF ISOLATING THIS ORGANISM FROM A SINGLE SET OF BLOOD CULTURES WHEN MULTIPLE SETS ARE DRAWN IS UNCERTAIN. PLEASE NOTIFY THE MICROBIOLOGY DEPARTMENT WITHIN ONE WEEK IF SPECIATION AND SENSITIVITIES ARE REQUIRED. Performed at [facility], [address], [place], [place] [address]    Report Status 06/27/2024 FINAL  Final  Blood Culture ID Panel (Reflexed)     Status: Abnormal   Collection Time: 06/24/24 11:41 AM  Result Value Ref Range Status   Enterococcus faecalis NOT DETECTED NOT DETECTED Final   Enterococcus Faecium NOT DETECTED NOT DETECTED Final   Listeria monocytogenes NOT DETECTED NOT DETECTED Final   Staphylococcus species DETECTED (A) NOT DETECTED Final    Comment: CRITICAL RESULT CALLED TO, READ BACK BY AND VERIFIED WITH: RBV V BRYK PHARMD 06/26/2024 @ 0514 BY DD    Staphylococcus aureus (BCID) NOT DETECTED NOT DETECTED Final   Staphylococcus epidermidis DETECTED (A) NOT DETECTED Final    Comment: Methicillin (oxacillin) resistant coagulase negative staphylococcus. Possible blood culture contaminant (unless isolated from more than one blood culture draw or clinical case suggests pathogenicity). No antibiotic treatment is indicated for blood  culture contaminants. CRITICAL RESULT CALLED TO, READ BACK BY AND VERIFIED WITH: RBV V BRYK PHARMD 06/26/2024 @ 0514 BY DD    Staphylococcus lugdunensis NOT DETECTED NOT  DETECTED Final   Streptococcus species NOT DETECTED NOT DETECTED Final   Streptococcus agalactiae NOT DETECTED NOT DETECTED Final   Streptococcus pneumoniae NOT DETECTED NOT DETECTED Final   Streptococcus pyogenes NOT DETECTED NOT DETECTED Final   A.calcoaceticus-baumannii NOT DETECTED NOT DETECTED Final   Bacteroides fragilis NOT DETECTED NOT DETECTED Final   Enterobacterales NOT DETECTED NOT DETECTED Final   Enterobacter cloacae complex NOT DETECTED NOT DETECTED Final   Escherichia coli NOT DETECTED NOT DETECTED Final   Klebsiella aerogenes NOT DETECTED NOT DETECTED Final   Klebsiella oxytoca NOT DETECTED NOT DETECTED Final   Klebsiella pneumoniae NOT DETECTED NOT DETECTED Final   Proteus species NOT DETECTED NOT DETECTED Final   Salmonella species NOT DETECTED NOT DETECTED Final   Serratia marcescens NOT DETECTED NOT DETECTED Final   Haemophilus influenzae NOT DETECTED NOT DETECTED Final   Neisseria meningitidis NOT DETECTED NOT DETECTED Final  Pseudomonas aeruginosa NOT DETECTED NOT DETECTED Final   Stenotrophomonas maltophilia NOT DETECTED NOT DETECTED Final   Candida albicans NOT DETECTED NOT DETECTED Final   Candida auris NOT DETECTED NOT DETECTED Final   Candida glabrata NOT DETECTED NOT DETECTED Final   Candida krusei NOT DETECTED NOT DETECTED Final   Candida parapsilosis NOT DETECTED NOT DETECTED Final   Candida tropicalis NOT DETECTED NOT DETECTED Final   Cryptococcus neoformans/gattii NOT DETECTED NOT DETECTED Final   Methicillin resistance mecA/C DETECTED (A) NOT DETECTED Final    Comment: CRITICAL RESULT CALLED TO, READ BACK BY AND VERIFIED WITH: RBV V BRYK PHARMD 06/26/2024 @ 0514 BY DD Performed at [facility], [address] [place], [place] [address]   MRSA Next Gen by PCR, Nasal     Status: None   Collection Time: 06/24/24 10:39 PM   Specimen: Nasal Mucosa; Nasal Swab  Result Value Ref Range Status   MRSA by PCR Next Gen NOT DETECTED NOT DETECTED Final     Comment: (NOTE) The GeneXpert MRSA Assay (FDA approved for NASAL specimens only), is one component of a comprehensive MRSA colonization surveillance program. It is not intended to diagnose MRSA infection nor to guide or monitor treatment for MRSA infections. Test performance is not FDA approved in patients less than 50 years old. Performed at [facility], [address] [place], [place] [address]   Culture, blood (Routine X 2) w Reflex to ID Panel     Status: None (Preliminary result)   Collection Time: 06/26/24  8:45 PM   Specimen: BLOOD RIGHT ARM  Result Value Ref Range Status   Specimen Description BLOOD RIGHT ARM  Final   Special Requests   Final    BOTTLES DRAWN AEROBIC AND ANAEROBIC Blood Culture adequate volume   Culture   Final    NO GROWTH 4 DAYS Performed at [facility], [address] [place], [place] [address]    Report Status PENDING  Incomplete  Culture, blood (Routine X 2) w Reflex to ID Panel     Status: None (Preliminary result)   Collection Time: 06/26/24  8:45 PM   Specimen: BLOOD RIGHT HAND  Result Value Ref Range Status   Specimen Description BLOOD RIGHT HAND  Final   Special Requests   Final    BOTTLES DRAWN AEROBIC ONLY Blood Culture results may not be optimal due to an inadequate volume of blood received in culture bottles   Culture   Final    NO GROWTH 4 DAYS Performed at [facility], [address] [place], [place] [address]    Report Status PENDING  Incomplete     Labs: BNP (last 3 results) Recent Labs    07/30/23 2236 08/11/23 0158 01/03/24 1407  BNP 623.8* 1,479.1* 1,129.3*   Basic Metabolic Panel: Recent Labs  Lab 06/24/24 1253 06/24/24 2233 06/25/24 0345 06/26/24 0906  NA 133*  --  132* 133*  K 4.1  --  3.4* 4.1  CL 93*  --  93* 92*  CO2 17*  --  23 24  GLUCOSE 51*  --  105* 89  BUN 67*  --  25* 29*  CREATININE 11.95* 6.41* 6.69* 7.66*  CALCIUM  8.8*  --  8.7* 8.4*  PHOS  --   --   --  5.4*   Liver  Function Tests: Recent Labs  Lab 06/24/24 1253 06/26/24 0906  AST 32  --   ALT 14  --   ALKPHOS  162*  --   BILITOT 0.9  --   PROT 7.9  --   ALBUMIN  2.6* 2.2*   No results for input(s): LIPASE, AMYLASE in the last 168 hours. Recent Labs  Lab 06/24/24 1355  AMMONIA 70*   CBC: Recent Labs  Lab 06/24/24 1253 06/24/24 2233 06/25/24 0345 06/26/24 0906  WBC 6.5 5.8 6.6 6.0  NEUTROABS 3.6  --   --   --   HGB 12.2 13.9 11.6* 11.1*  HCT 36.7 41.0 34.6* 33.4*  MCV 84.8 83.3 83.0 84.8  PLT 288 297 280 308   Cardiac Enzymes: Recent Labs  Lab 06/24/24 1358  CKTOTAL 249*   BNP: Invalid input(s): POCBNP CBG: Recent Labs  Lab 06/28/24 1623 06/28/24 2034 06/29/24 0727 06/29/24 1127 06/30/24 0804  GLUCAP 131* 124* 88 80 134*   D-Dimer No results for input(s): DDIMER in the last 72 hours. Hgb A1c No results for input(s): HGBA1C in the last 72 hours. Lipid Profile No results for input(s): CHOL, HDL, LDLCALC, TRIG, CHOLHDL, LDLDIRECT in the last 72 hours. Thyroid  function studies No results for input(s): TSH, T4TOTAL, T3FREE, THYROIDAB in the last 72 hours.  Invalid input(s): FREET3 Anemia work up No results for input(s): VITAMINB12, FOLATE, FERRITIN, TIBC, IRON , RETICCTPCT in the last 72 hours. Urinalysis    Component Value Date/Time   COLORURINE YELLOW 06/24/2024 1710   APPEARANCEUR CLEAR 06/24/2024 1710   LABSPEC 1.014 06/24/2024 1710   PHURINE 5.0 06/24/2024 1710   GLUCOSEU NEGATIVE 06/24/2024 1710   HGBUR NEGATIVE 06/24/2024 1710   BILIRUBINUR NEGATIVE 06/24/2024 1710   KETONESUR NEGATIVE 06/24/2024 1710   PROTEINUR >=300 (A) 06/24/2024 1710   UROBILINOGEN 0.2 01/29/2014 2116   NITRITE NEGATIVE 06/24/2024 1710   LEUKOCYTESUR NEGATIVE 06/24/2024 1710   Sepsis Labs Recent Labs  Lab 06/24/24 1253 06/24/24 2233 06/25/24 0345 06/26/24 0906  WBC 6.5 5.8 6.6 6.0   Microbiology Recent Results (from the past 240  hours)  Resp panel by RT-PCR (RSV, Flu A&B, Covid) Anterior Nasal Swab     Status: None   Collection Time: 06/24/24 11:19 AM   Specimen: Anterior Nasal Swab  Result Value Ref Range Status   SARS Coronavirus 2 by RT PCR NEGATIVE NEGATIVE Final   Influenza A by PCR NEGATIVE NEGATIVE Final   Influenza B by PCR NEGATIVE NEGATIVE Final    Comment: (NOTE) The Xpert Xpress SARS-CoV-2/FLU/RSV plus assay is intended as an aid in the diagnosis of influenza from Nasopharyngeal swab specimens and should not be used as a sole basis for treatment. Nasal washings and aspirates are unacceptable for Xpert Xpress SARS-CoV-2/FLU/RSV testing.  Fact Sheet for Patients: bloggercourse.com  Fact Sheet for Healthcare Providers: seriousbroker.it  This test is not yet approved or cleared by the [place]  FDA and has been authorized for detection and/or diagnosis of SARS-CoV-2 by FDA under an Emergency Use Authorization (EUA). This EUA will remain in effect (meaning this test can be used) for the duration of the COVID-19 declaration under Section 564(b)(1) of the Act, 21 U.S.C. section 360bbb-3(b)(1), unless the authorization is terminated or revoked.     Resp Syncytial Virus by PCR NEGATIVE NEGATIVE Final    Comment: (NOTE) Fact Sheet for Patients: bloggercourse.com  Fact Sheet for Healthcare Providers: seriousbroker.it  This test is not yet approved or cleared by the [place]  FDA and has been authorized for detection and/or diagnosis of SARS-CoV-2 by FDA under an Emergency Use Authorization (EUA). This EUA will remain in effect (meaning this test can be used)  for the duration of the COVID-19 declaration under Section 564(b)(1) of the Act, 21 U.S.C. section 360bbb-3(b)(1), unless the authorization is terminated or revoked.  Performed at [facility], [address] [place],  [place] [address]   Blood culture (routine x 2)     Status: None   Collection Time: 06/24/24 11:36 AM   Specimen: BLOOD  Result Value Ref Range Status   Specimen Description BLOOD RIGHT ANTECUBITAL  Final   Special Requests   Final    BOTTLES DRAWN AEROBIC ONLY Blood Culture results may not be optimal due to an inadequate volume of blood received in culture bottles   Culture   Final    NO GROWTH 5 DAYS Performed at [facility], [address] [place], [place] [address]    Report Status 06/29/2024 FINAL  Final  Blood culture (routine x 2)     Status: Abnormal   Collection Time: 06/24/24 11:41 AM   Specimen: BLOOD  Result Value Ref Range Status   Specimen Description BLOOD BLOOD RIGHT HAND  Final   Special Requests   Final    BOTTLES DRAWN AEROBIC ONLY Blood Culture results may not be optimal due to an inadequate volume of blood received in culture bottles   Culture  Setup Time   Final    GRAM POSITIVE COCCI AEROBIC BOTTLE ONLY RBV V BRYK PHARMD 06/26/2024 @ 0514 BY DD    Culture (A)  Final    STAPHYLOCOCCUS EPIDERMIDIS THE SIGNIFICANCE OF ISOLATING THIS ORGANISM FROM A SINGLE SET OF BLOOD CULTURES WHEN MULTIPLE SETS ARE DRAWN IS UNCERTAIN. PLEASE NOTIFY THE MICROBIOLOGY DEPARTMENT WITHIN ONE WEEK IF SPECIATION AND SENSITIVITIES ARE REQUIRED. Performed at [facility], [address] [place], [place] [address]    Report Status 06/27/2024 FINAL  Final  Blood Culture ID Panel (Reflexed)     Status: Abnormal   Collection Time: 06/24/24 11:41 AM  Result Value Ref Range Status   Enterococcus faecalis NOT DETECTED NOT DETECTED Final   Enterococcus Faecium NOT DETECTED NOT DETECTED Final   Listeria monocytogenes NOT DETECTED NOT DETECTED Final   Staphylococcus species DETECTED (A) NOT DETECTED Final    Comment: CRITICAL RESULT CALLED TO, READ BACK BY AND VERIFIED WITH: RBV V BRYK PHARMD 06/26/2024 @ 0514 BY DD    Staphylococcus aureus (BCID) NOT DETECTED NOT DETECTED Final    Staphylococcus epidermidis DETECTED (A) NOT DETECTED Final    Comment: Methicillin (oxacillin) resistant coagulase negative staphylococcus. Possible blood culture contaminant (unless isolated from more than one blood culture draw or clinical case suggests pathogenicity). No antibiotic treatment is indicated for blood  culture contaminants. CRITICAL RESULT CALLED TO, READ BACK BY AND VERIFIED WITH: RBV V BRYK PHARMD 06/26/2024 @ 0514 BY DD    Staphylococcus lugdunensis NOT DETECTED NOT DETECTED Final   Streptococcus species NOT DETECTED NOT DETECTED Final   Streptococcus agalactiae NOT DETECTED NOT DETECTED Final   Streptococcus pneumoniae NOT DETECTED NOT DETECTED Final   Streptococcus pyogenes NOT DETECTED NOT DETECTED Final   A.calcoaceticus-baumannii NOT DETECTED NOT DETECTED Final   Bacteroides fragilis NOT DETECTED NOT DETECTED Final   Enterobacterales NOT DETECTED NOT DETECTED Final   Enterobacter cloacae complex NOT DETECTED NOT DETECTED Final   Escherichia coli NOT DETECTED NOT DETECTED Final   Klebsiella aerogenes NOT DETECTED NOT DETECTED Final   Klebsiella oxytoca NOT DETECTED NOT DETECTED Final   Klebsiella pneumoniae NOT DETECTED NOT DETECTED Final   Proteus species NOT DETECTED NOT DETECTED Final  Salmonella species NOT DETECTED NOT DETECTED Final   Serratia marcescens NOT DETECTED NOT DETECTED Final   Haemophilus influenzae NOT DETECTED NOT DETECTED Final   Neisseria meningitidis NOT DETECTED NOT DETECTED Final   Pseudomonas aeruginosa NOT DETECTED NOT DETECTED Final   Stenotrophomonas maltophilia NOT DETECTED NOT DETECTED Final   Candida albicans NOT DETECTED NOT DETECTED Final   Candida auris NOT DETECTED NOT DETECTED Final   Candida glabrata NOT DETECTED NOT DETECTED Final   Candida krusei NOT DETECTED NOT DETECTED Final   Candida parapsilosis NOT DETECTED NOT DETECTED Final   Candida tropicalis NOT DETECTED NOT DETECTED Final   Cryptococcus neoformans/gattii NOT  DETECTED NOT DETECTED Final   Methicillin resistance mecA/C DETECTED (A) NOT DETECTED Final    Comment: CRITICAL RESULT CALLED TO, READ BACK BY AND VERIFIED WITH: RBV V BRYK PHARMD 06/26/2024 @ 0514 BY DD Performed at [facility], [address], [place], [place] [address]   MRSA Next Gen by PCR, Nasal     Status: None   Collection Time: 06/24/24 10:39 PM   Specimen: Nasal Mucosa; Nasal Swab  Result Value Ref Range Status   MRSA by PCR Next Gen NOT DETECTED NOT DETECTED Final    Comment: (NOTE) The GeneXpert MRSA Assay (FDA approved for NASAL specimens only), is one component of a comprehensive MRSA colonization surveillance program. It is not intended to diagnose MRSA infection nor to guide or monitor treatment for MRSA infections. Test performance is not FDA approved in patients less than 7 years old. Performed at [facility], [address], [place], [place] [address]   Culture, blood (Routine X 2) w Reflex to ID Panel     Status: None (Preliminary result)   Collection Time: 06/26/24  8:45 PM   Specimen: BLOOD RIGHT ARM  Result Value Ref Range Status   Specimen Description BLOOD RIGHT ARM  Final   Special Requests   Final    BOTTLES DRAWN AEROBIC AND ANAEROBIC Blood Culture adequate volume   Culture   Final    NO GROWTH 4 DAYS Performed at [facility], [address] [place], [place] [address]    Report Status PENDING  Incomplete  Culture, blood (Routine X 2) w Reflex to ID Panel     Status: None (Preliminary result)   Collection Time: 06/26/24  8:45 PM   Specimen: BLOOD RIGHT HAND  Result Value Ref Range Status   Specimen Description BLOOD RIGHT HAND  Final   Special Requests   Final    BOTTLES DRAWN AEROBIC ONLY Blood Culture results may not be optimal due to an inadequate volume of blood received in culture bottles   Culture   Final    NO GROWTH 4 DAYS Performed at [facility], [address], [place], [place] [address]    Report Status  PENDING  Incomplete     Time coordinating discharge: Over 35 minutes  SIGNED:   Erle Odell Castor, MD  [facility] 06/30/2024, 9:47 AM Pager   If 7PM-7AM, please contact night-coverage [email or web address] Password [email or web address]

## 2024-06-30 NOTE — Progress Notes (Signed)
 [facility] Progress Note   Subjective:   Seen in room along with PA student. She is feeling well this AM - she is hoping for discharge. Occ SOB after eating, not bad. New TDC is not hurting her.  Objective Vitals:   06/29/24 2115 06/30/24 0000 06/30/24 0431 06/30/24 0801  BP: (!) 127/95 (!) 146/86 (!) 140/74 122/72  Pulse: 83 79 78 78  Resp: (!) 24  18 19   Temp: 98.1 F (36.7 C) 98.1 F (36.7 C) 98.4 F (36.9 C) 98.9 F (37.2 C)  TempSrc: Axillary Oral  Oral  SpO2: 99% 98% 97% 97%  Weight: 63.5 kg     Height:       Physical Exam General: Ill appearing woman in NAD. Room air. Heart:RRR no murmur Lungs: CTA anteriorly, no wheezing or rhonchi Abdomen: soft, non-tender, non-distended  Extremities: No BLE edema Dialysis Access: TDC in R chest. LUE AVF +t/b but deep (1st stage BVT)   Additional Objective Labs: Basic Metabolic Panel: Recent Labs  Lab 06/24/24 1253 06/24/24 2233 06/25/24 0345 06/26/24 0906  NA 133*  --  132* 133*  K 4.1  --  3.4* 4.1  CL 93*  --  93* 92*  CO2 17*  --  23 24  GLUCOSE 51*  --  105* 89  BUN 67*  --  25* 29*  CREATININE 11.95* 6.41* 6.69* 7.66*  CALCIUM  8.8*  --  8.7* 8.4*  PHOS  --   --   --  5.4*   Liver Function Tests: Recent Labs  Lab 06/24/24 1253 06/26/24 0906  AST 32  --   ALT 14  --   ALKPHOS 162*  --   BILITOT 0.9  --   PROT 7.9  --   ALBUMIN  2.6* 2.2*   CBC: Recent Labs  Lab 06/24/24 1253 06/24/24 2233 06/25/24 0345 06/26/24 0906  WBC 6.5 5.8 6.6 6.0  NEUTROABS 3.6  --   --   --   HGB 12.2 13.9 11.6* 11.1*  HCT 36.7 41.0 34.6* 33.4*  MCV 84.8 83.3 83.0 84.8  PLT 288 297 280 308   Studies/Results: IR TUNNELED CENTRAL VENOUS CATH PLC W IMG Result Date: 06/29/2024 INDICATION: In stage renal disease requiring hemodialysis. Durable venous access placement for therapy. EXAM: TUNNELED hemodialysis catheter WITH ULTRASOUND AND FLUOROSCOPIC GUIDANCE MEDICATIONS: The antibiotic was given in an  appropriate time interval prior to skin puncture. ANESTHESIA/SEDATION: Moderate (conscious) sedation was employed during this procedure. A total of Versed  see medical record mg and Fentanyl  see medical record mcg was administered intravenously by the radiology nurse. Total intra-service moderate Sedation Time: See medical record minutes. The patient's level of consciousness and vital signs were monitored continuously by radiology nursing throughout the procedure under my direct supervision. FLUOROSCOPY: Radiation Exposure Index (as provided by the fluoroscopic device): See medical record mGy Kerma COMPLICATIONS: None immediate. PROCEDURE: Informed written consent was obtained from the patient after a discussion of the risks, benefits, and alternatives to treatment. Questions regarding the procedure were encouraged and answered. The right neck and chest were prepped with chlorhexidine  in a sterile fashion, and a sterile drape was applied covering the operative field. Maximum barrier sterile technique with sterile gowns and gloves were used for the procedure. A timeout was performed prior to the initiation of the procedure. After creating a small venotomy incision, a micropuncture kit was utilized to access the right internal jugular vein under direct, real-time ultrasound guidance after the overlying soft tissues were anesthetized with 1% lidocaine   with epinephrine . Ultrasound image documentation was performed. The microwire was kinked to measure appropriate catheter length. The micropuncture sheath was exchanged for a peel-away sheath over a guidewire. A 23 cm palindrome tunneled hemodialysis catheter was tunneled in a retrograde fashion from the anterior chest wall to the venotomy incision. The catheter was then placed through the peel-away sheath with tip ultimately positioned at the superior caval-atrial junction. Final catheter positioning was confirmed and documented with a spot radiographic image. The catheter  aspirates and flushes normally. The catheter was flushed with appropriate volume heparin  dwells. The catheter exit site was secured with a 0-Prolene retention suture. The venotomy incision was closed with an interrupted 4-0 Vicryl, Dermabond and Steri-strips. Dressings were applied. The patient tolerated the procedure well without immediate post procedural complication. FINDINGS: After catheter placement, the tip lies within the superior cavoatrial junction. The catheter aspirates and flushes normally and is ready for immediate use. IMPRESSION: Successful placement of 23 cm palindrome hemodialysis catheter via the right internal jugular vein with tip terminating at the superior caval atrial junction. The catheter is ready for immediate use. Electronically Signed   By: Cordella Banner   On: 06/29/2024 20:17   Medications:  vancomycin       amLODipine   10 mg Oral Daily   aspirin  EC  81 mg Oral Daily   atorvastatin   80 mg Oral Daily   Chlorhexidine  Gluconate Cloth  6 each Topical Daily   fluticasone  furoate-vilanterol  1 puff Inhalation Daily   heparin   5,000 Units Subcutaneous Q8H   naphazoline-pheniramine  2 drop Both Eyes BID   pantoprazole   40 mg Oral Q1200   sodium chloride  flush  10-40 mL Intracatheter Q12H   sodium chloride  flush  3 mL Intravenous Q12H    Dialysis Orders MWF - [facility] 4hr, 500/A1.5, EDW 64.9kg, 3K/2.5Ca bath, TDC + AVF, heparin  2000 unit bolus - Mircera 60mcg IV q 2 weeks (last 10/27) - Calcitriol  1.50mcg PO q HD   Assessment/Plan:  AMS: 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 Continue abx + lactulose.  MRSE Bacteremia.  Blood cultures positive for MRSE (1/2). S/p line holiday 11/7-11/10. Echo without vegetations. Will be on 2 week course of IV Vanc q HD (until 07/10/24)  ESRD:  Continue HD on MWF schedule - next tomorrow -> as outpatient. Hypertension/volume: BP stable, continue home meds, lower EDW.  Anemia: Hgb 11.1 - no ESA  needed for now.  Metabolic bone disease: Ca ok, Phos binder.   Cirrhosis: Ammonia elevated. Continue lactulose.  T2DM  Hx CVA  Parkinson's disease: Fiance reports baseline memory is good.Continued restlessness of BLE. Defer to neurology  Hx substance abuse: Encouraged cessation.   Izetta Boehringer, PA-C 06/30/2024, 11:49 AM  [employer]

## 2024-06-30 NOTE — Plan of Care (Signed)
  Problem: Health Behavior/Discharge Planning: Goal: Ability to manage health-related needs will improve Outcome: Progressing   Problem: Clinical Measurements: Goal: Ability to maintain clinical measurements within normal limits will improve Outcome: Progressing Goal: Will remain free from infection Outcome: Progressing Goal: Diagnostic test results will improve Outcome: Progressing Goal: Respiratory complications will improve Outcome: Progressing Goal: Cardiovascular complication will be avoided Outcome: Progressing   Problem: Activity: Goal: Risk for activity intolerance will decrease Outcome: Progressing   Problem: Nutrition: Goal: Adequate nutrition will be maintained Outcome: Progressing   Problem: Coping: Goal: Level of anxiety will decrease Outcome: Progressing   Problem: Elimination: Goal: Will not experience complications related to bowel motility Outcome: Progressing Goal: Will not experience complications related to urinary retention Outcome: Progressing   Problem: Pain Managment: Goal: General experience of comfort will improve and/or be controlled Outcome: Progressing   Problem: Safety: Goal: Ability to remain free from injury will improve Outcome: Progressing   Problem: Skin Integrity: Goal: Risk for impaired skin integrity will decrease Outcome: Progressing   Problem: Education: Goal: Knowledge of disease and its progression will improve Outcome: Progressing Goal: Individualized Educational Video(s) Outcome: Progressing   Problem: Fluid Volume: Goal: Compliance with measures to maintain balanced fluid volume will improve Outcome: Progressing   Problem: Health Behavior/Discharge Planning: Goal: Ability to manage health-related needs will improve Outcome: Progressing   Problem: Nutritional: Goal: Ability to make healthy dietary choices will improve Outcome: Progressing   Problem: Clinical Measurements: Goal: Complications related to the  disease process, condition or treatment will be avoided or minimized Outcome: Progressing

## 2024-07-01 LAB — CULTURE, BLOOD (ROUTINE X 2)
Culture: NO GROWTH
Culture: NO GROWTH
Special Requests: ADEQUATE

## 2024-08-03 NOTE — Progress Notes (Unsigned)
 Office Note   History of Present Illness   Carol [name] is a 63 y.o. (1961/06/01) female who presents for follow up for dialysis access.  Current Outpatient Medications  Medication Sig Dispense Refill   albuterol  (PROVENTIL  HFA;VENTOLIN  HFA) 108 (90 Base) MCG/ACT inhaler Inhale 2 puffs into the lungs every 4 (four) hours as needed for wheezing or shortness of breath.     amLODipine  (NORVASC ) 10 MG tablet Take 1 tablet (10 mg total) by mouth daily. 30 tablet 3   aspirin  EC 81 MG tablet Take 1 tablet (81 mg total) by mouth daily. Swallow whole. 30 tablet 0   atorvastatin  (LIPITOR ) 80 MG tablet Take 1 tablet (80 mg total) by mouth daily. 30 tablet 0   budesonide -formoterol  (SYMBICORT) 160-4.5 MCG/ACT inhaler Inhale 2 puffs into the lungs in the morning and at bedtime.     busPIRone (BUSPAR) 10 MG tablet SMARTSIG:1 Tablet(s) By Mouth Every 12 Hours     calcitRIOL  (ROCALTROL ) 0.25 MCG capsule Take 0.25 mcg by mouth daily.     carvedilol (COREG) 6.25 MG tablet Take 6.25 mg by mouth 2 (two) times daily.     cetirizine (ZYRTEC) 10 MG tablet Take 10 mg by mouth daily as needed for allergies.     Darbepoetin Alfa  (ARANESP ) 60 MCG/0.3ML SOSY injection Inject 0.3 mLs (60 mcg total) into the skin every Thursday at 6pm.     eszopiclone 3 MG TABS Take 3 mg by mouth at bedtime. Take immediately before bedtime     famotidine  (PEPCID ) 20 MG tablet Take 20 mg by mouth every morning.     gabapentin  (NEURONTIN ) 100 MG capsule Take 1 capsule (100 mg total) by mouth 2 (two) times daily. 60 capsule 0   glucagon  1 MG injection Follow package directions for low blood sugar. 1 each 1   hydrOXYzine  (ATARAX ) 25 MG tablet Take 25 mg by mouth 2 (two) times daily as needed.     lidocaine  (LIDODERM ) 5 % Place 1 patch onto the skin daily. Remove & Discard patch within 12 hours or as directed by MD 30 patch 0   losartan  (COZAAR ) 50 MG tablet Take 50 mg by mouth daily.     naloxone  (NARCAN ) nasal spray 4  mg/0.1 mL If concern for opioid overdose, spray in nostril. Call 911. 1 each 0   naphazoline-pheniramine (NAPHCON-A) 0.025-0.3 % ophthalmic solution Place 2 drops into both eyes 2 (two) times daily.     nitroGLYCERIN  (NITROSTAT ) 0.4 MG SL tablet Place 1 tablet (0.4 mg total) under the tongue every 5 (five) minutes as needed for chest pain. 30 tablet 0   oxyCODONE -acetaminophen  (PERCOCET/ROXICET) 5-325 MG tablet Take 1 tablet by mouth every 8 (eight) hours as needed for severe pain (pain score 7-10). 10 tablet 0   pantoprazole  (PROTONIX ) 40 MG tablet Take 1 tablet (40 mg total) by mouth daily at 12 noon. (Patient taking differently: Take 40 mg by mouth at bedtime.) 30 tablet 0   promethazine -dextromethorphan (PROMETHAZINE -DM) 6.25-15 MG/5ML syrup Take 5 mLs by mouth 4 (four) times daily as needed for cough. 50 mL 0   QUEtiapine  (SEROQUEL  XR) 300 MG 24 hr tablet SMARTSIG:1 Tablet(s) By Mouth Every Evening     sertraline  (ZOLOFT ) 50 MG tablet Take 50 mg by mouth every morning.     torsemide  (DEMADEX ) 100 MG tablet Take 100 mg by mouth 2 (two) times daily.     triamcinolone  ointment (KENALOG ) 0.1 % 1 Application 2 (two) times daily as needed.  vancomycin  (VANCOREADY) 750 MG/150ML SOLN Inject 150 mLs (750 mg total) into the vein every Monday, Wednesday, and Friday with hemodialysis.     No current facility-administered medications for this visit.    ***REVIEW OF SYSTEMS (negative unless checked):   Cardiac:  []  Chest pain or chest pressure? []  Shortness of breath upon activity? []  Shortness of breath when lying flat? []  Irregular heart rhythm?  Vascular:  []  Pain in calf, thigh, or hip brought on by walking? []  Pain in feet at night that wakes you up from your sleep? []  Blood clot in your veins? []  Leg swelling?  Pulmonary:  []  Oxygen  at home? []  Productive cough? []  Wheezing?  Neurologic:  []  Sudden weakness in arms or legs? []  Sudden numbness in arms or legs? []  Sudden onset of  difficult speaking or slurred speech? []  Temporary loss of vision in one eye? []  Problems with dizziness?  Gastrointestinal:  []  Blood in stool? []  Vomited blood?  Genitourinary:  []  Burning when urinating? []  Blood in urine?  Psychiatric:  []  Major depression  Hematologic:  []  Bleeding problems? []  Problems with blood clotting?  Dermatologic:  []  Rashes or ulcers?  Constitutional:  []  Fever or chills?  Ear/Nose/Throat:  []  Change in hearing? []  Nose bleeds? []  Sore throat?  Musculoskeletal:  []  Back pain? []  Joint pain? []  Muscle pain?   Physical Examination  There were no vitals filed for this visit. There is no height or weight on file to calculate BMI.  General:  WDWN in NAD; vital signs documented above Gait: Not observed HENT: WNL, normocephalic Pulmonary: normal non-labored breathing , without rales, rhonchi,  wheezing Cardiac: {Desc; regular/irreg:14544} HR, without murmurs {With/Without:20273} carotid bruit*** Abdomen: soft, NT, no masses Skin: {With/Without:20273} rashes Vascular Exam/Pulses: *** radial and brachial pulses bilaterally Extremities: {With/Without:20273} ischemic changes, {With/Without:20273} gangrene , {With/Without:20273} cellulitis; {With/Without:20273} open wounds;  Musculoskeletal: no muscle wasting or atrophy  Neurologic: A&O X 3;  No focal weakness or paresthesias are detected Psychiatric:  The pt has {Desc; normal/abnormal:11317::Normal} affect.   Non-invasive Vascular Imaging   Left/Right Arm Access Duplex  (***):    BUE Doppler (***):  R arm:  Brachial: {Signals:19197::none,mono,bi,tri}, *** mm Radial: {Signals:19197::none,mono,bi,tri}, *** mm Ulnar: {Signals:19197::none,mono,bi,tri}, *** mm L arm:  Brachial: {Signals:19197::none,mono,bi,tri}, *** mm Radial: {Signals:19197::none,mono,bi,tri}, *** mm Ulnar: {Signals:19197::none,mono,bi,tri}, *** mm  BUE Vein Mapping   (***):  R arm: acceptable vein conduits include *** L arm: acceptable vein conduits include ***    Medical Decision Making   [name] is a 63 y.o. female who presents with {KidneyDisease:19197::ESRD,chronic kidney disease stage ***} for dialysis access follow up  ***   Ahmed Holster PA-C [facility] Office: [phone]  Clinic MD: ***

## 2024-08-04 ENCOUNTER — Ambulatory Visit (HOSPITAL_COMMUNITY): Admission: RE | Admit: 2024-08-04

## 2024-08-04 ENCOUNTER — Ambulatory Visit: Attending: Vascular Surgery

## 2024-09-24 ENCOUNTER — Ambulatory Visit

## 2024-09-24 ENCOUNTER — Ambulatory Visit (HOSPITAL_COMMUNITY)

## 2024-09-24 NOTE — Progress Notes (Unsigned)
 " POST OPERATIVE OFFICE NOTE    CC:  F/u for surgery  HPI:  This is a 64 y.o. 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 Pearline.   Pt states she does *** have pain/numbness in the *** hand.    The pt *** on dialysis *** at *** location.   Allergies[1]  Current Outpatient Medications  Medication Sig Dispense Refill   albuterol  (PROVENTIL  HFA;VENTOLIN  HFA) 108 (90 Base) MCG/ACT inhaler Inhale 2 puffs into the lungs every 4 (four) hours as needed for wheezing or shortness of breath.     amLODipine  (NORVASC ) 10 MG tablet Take 1 tablet (10 mg total) by mouth daily. 30 tablet 3   aspirin  EC 81 MG tablet Take 1 tablet (81 mg total) by mouth daily. Swallow whole. 30 tablet 0   atorvastatin  (LIPITOR ) 80 MG tablet Take 1 tablet (80 mg total) by mouth daily. 30 tablet 0   budesonide -formoterol  (SYMBICORT) 160-4.5 MCG/ACT inhaler Inhale 2 puffs into the lungs in the morning and at bedtime.     busPIRone (BUSPAR) 10 MG tablet SMARTSIG:1 Tablet(s) By Mouth Every 12 Hours     calcitRIOL  (ROCALTROL ) 0.25 MCG capsule Take 0.25 mcg by mouth daily.     carvedilol (COREG) 6.25 MG tablet Take 6.25 mg by mouth 2 (two) times daily.     cetirizine (ZYRTEC) 10 MG tablet Take 10 mg by mouth daily as needed for allergies.     Darbepoetin Alfa  (ARANESP ) 60 MCG/0.3ML SOSY injection Inject 0.3 mLs (60 mcg total) into the skin every Thursday at 6pm.     eszopiclone 3 MG TABS Take 3 mg by mouth at bedtime. Take immediately before bedtime     famotidine  (PEPCID ) 20 MG tablet Take 20 mg by mouth every morning.     gabapentin  (NEURONTIN ) 100 MG capsule Take 1 capsule (100 mg total) by mouth 2 (two) times daily. 60 capsule 0   glucagon  1 MG injection Follow package directions for low blood sugar. 1 each 1   hydrOXYzine  (ATARAX ) 25 MG tablet Take 25 mg by mouth 2 (two) times daily as needed.     lidocaine  (LIDODERM ) 5 % Place 1 patch onto the skin daily. Remove & Discard patch within 12  hours or as directed by MD 30 patch 0   losartan  (COZAAR ) 50 MG tablet Take 50 mg by mouth daily.     naloxone  (NARCAN ) nasal spray 4 mg/0.1 mL If concern for opioid overdose, spray in nostril. Call 911. 1 each 0   naphazoline-pheniramine (NAPHCON-A) 0.025-0.3 % ophthalmic solution Place 2 drops into both eyes 2 (two) times daily.     nitroGLYCERIN  (NITROSTAT ) 0.4 MG SL tablet Place 1 tablet (0.4 mg total) under the tongue every 5 (five) minutes as needed for chest pain. 30 tablet 0   oxyCODONE -acetaminophen  (PERCOCET/ROXICET) 5-325 MG tablet Take 1 tablet by mouth every 8 (eight) hours as needed for severe pain (pain score 7-10). 10 tablet 0   pantoprazole  (PROTONIX ) 40 MG tablet Take 1 tablet (40 mg total) by mouth daily at 12 noon. (Patient taking differently: Take 40 mg by mouth at bedtime.) 30 tablet 0   promethazine -dextromethorphan (PROMETHAZINE -DM) 6.25-15 MG/5ML syrup Take 5 mLs by mouth 4 (four) times daily as needed for cough. 50 mL 0   QUEtiapine  (SEROQUEL  XR) 300 MG 24 hr tablet SMARTSIG:1 Tablet(s) By Mouth Every Evening     sertraline  (ZOLOFT ) 50 MG tablet Take 50 mg by mouth every morning.  torsemide  (DEMADEX ) 100 MG tablet Take 100 mg by mouth 2 (two) times daily.     triamcinolone  ointment (KENALOG ) 0.1 % 1 Application 2 (two) times daily as needed.     vancomycin  (VANCOREADY) 750 MG/150ML SOLN Inject 150 mLs (750 mg total) into the vein every Monday, Wednesday, and Friday with hemodialysis.     No current facility-administered medications for this visit.     ROS:  See HPI  Physical Exam:  ***  Incision:  *** Extremities:   There *** a palpable *** pulse.   Motor and sensory *** in tact.   There *** a thrill/bruit present.  Access is *** easily palpable   Dialysis Duplex on 09/24/2024: ***   Assessment/Plan:  This is a 64 y.o. female who is s/p: ***  -the pt does *** have evidence of steal. -pt's access can be used ***. -if pt has tunneled catheter, this can  be removed at the discretion of the dialysis center once the pt's access has been successfully cannulated to their satisfaction.  -discussed with pt that access does not last forever and will need intervention or even new access at some point.  -the pt will follow up ***   Lucie Apt, [facility] [phone]  Clinic MD:  [place]    [1]  Allergies Allergen Reactions   Penicillins Shortness Of Breath and Other (See Comments)    Caused yeast infection   Ultram  [Tramadol ] Other (See Comments)    Made her tongue raw   "
